# Patient Record
Sex: Female | Born: 1950 | Race: Black or African American | Hispanic: No | Marital: Married | Smoking: Former smoker
Health system: Southern US, Community
[De-identification: ages and names within clinical notes are randomized; demographics above are authoritative.]

## PROBLEM LIST (undated history)

## (undated) DIAGNOSIS — H269 Unspecified cataract: Secondary | ICD-10-CM

## (undated) DIAGNOSIS — J189 Pneumonia, unspecified organism: Secondary | ICD-10-CM

## (undated) DIAGNOSIS — F329 Major depressive disorder, single episode, unspecified: Secondary | ICD-10-CM

## (undated) DIAGNOSIS — Z973 Presence of spectacles and contact lenses: Secondary | ICD-10-CM

## (undated) DIAGNOSIS — I639 Cerebral infarction, unspecified: Principal | ICD-10-CM

## (undated) DIAGNOSIS — E7800 Pure hypercholesterolemia, unspecified: Secondary | ICD-10-CM

## (undated) DIAGNOSIS — G629 Polyneuropathy, unspecified: Secondary | ICD-10-CM

## (undated) DIAGNOSIS — R590 Localized enlarged lymph nodes: Secondary | ICD-10-CM

## (undated) DIAGNOSIS — Z9289 Personal history of other medical treatment: Secondary | ICD-10-CM

## (undated) DIAGNOSIS — H409 Unspecified glaucoma: Secondary | ICD-10-CM

## (undated) DIAGNOSIS — D649 Anemia, unspecified: Secondary | ICD-10-CM

## (undated) DIAGNOSIS — E119 Type 2 diabetes mellitus without complications: Secondary | ICD-10-CM

## (undated) DIAGNOSIS — F419 Anxiety disorder, unspecified: Secondary | ICD-10-CM

## (undated) DIAGNOSIS — F319 Bipolar disorder, unspecified: Secondary | ICD-10-CM

## (undated) DIAGNOSIS — I10 Essential (primary) hypertension: Secondary | ICD-10-CM

## (undated) DIAGNOSIS — C801 Malignant (primary) neoplasm, unspecified: Secondary | ICD-10-CM

## (undated) DIAGNOSIS — F32A Depression, unspecified: Secondary | ICD-10-CM

## (undated) HISTORY — PX: TUBAL LIGATION: SHX77

## (undated) HISTORY — DX: Unspecified glaucoma: H40.9

## (undated) HISTORY — PX: COLONOSCOPY W/ BIOPSIES AND POLYPECTOMY: SHX1376

## (undated) HISTORY — PX: CATARACT EXTRACTION W/ INTRAOCULAR LENS  IMPLANT, BILATERAL: SHX1307

## (undated) HISTORY — DX: Unspecified cataract: H26.9

## (undated) HISTORY — DX: Cerebral infarction, unspecified: I63.9

## (undated) HISTORY — DX: Bipolar disorder, unspecified: F31.9

---

## 2008-09-22 ENCOUNTER — Emergency Department (HOSPITAL_COMMUNITY): Admission: EM | Admit: 2008-09-22 | Discharge: 2008-09-22 | Payer: Self-pay | Admitting: Emergency Medicine

## 2009-03-12 ENCOUNTER — Emergency Department (HOSPITAL_COMMUNITY): Admission: EM | Admit: 2009-03-12 | Discharge: 2009-03-12 | Payer: Self-pay | Admitting: Family Medicine

## 2009-07-29 ENCOUNTER — Emergency Department (HOSPITAL_COMMUNITY): Admission: EM | Admit: 2009-07-29 | Discharge: 2009-07-29 | Payer: Self-pay | Admitting: Emergency Medicine

## 2009-07-31 ENCOUNTER — Emergency Department (HOSPITAL_COMMUNITY): Admission: EM | Admit: 2009-07-31 | Discharge: 2009-07-31 | Payer: Self-pay | Admitting: Family Medicine

## 2010-10-08 ENCOUNTER — Emergency Department (HOSPITAL_COMMUNITY)
Admission: EM | Admit: 2010-10-08 | Discharge: 2010-10-08 | Payer: Self-pay | Source: Home / Self Care | Admitting: Family Medicine

## 2011-02-13 LAB — CULTURE, ROUTINE-ABSCESS

## 2012-03-30 ENCOUNTER — Ambulatory Visit: Payer: Self-pay | Admitting: *Deleted

## 2012-09-14 ENCOUNTER — Encounter (HOSPITAL_COMMUNITY): Payer: Self-pay | Admitting: *Deleted

## 2012-09-14 ENCOUNTER — Emergency Department (HOSPITAL_COMMUNITY)
Admission: EM | Admit: 2012-09-14 | Discharge: 2012-09-14 | Disposition: A | Discharge status: Home / Self Care | Payer: BC Managed Care – PPO | Attending: Emergency Medicine | Admitting: Emergency Medicine

## 2012-09-14 DIAGNOSIS — Z79899 Other long term (current) drug therapy: Secondary | ICD-10-CM | POA: Insufficient documentation

## 2012-09-14 DIAGNOSIS — F3289 Other specified depressive episodes: Secondary | ICD-10-CM | POA: Insufficient documentation

## 2012-09-14 DIAGNOSIS — G5690 Unspecified mononeuropathy of unspecified upper limb: Secondary | ICD-10-CM | POA: Insufficient documentation

## 2012-09-14 DIAGNOSIS — E119 Type 2 diabetes mellitus without complications: Secondary | ICD-10-CM | POA: Insufficient documentation

## 2012-09-14 DIAGNOSIS — F329 Major depressive disorder, single episode, unspecified: Secondary | ICD-10-CM | POA: Insufficient documentation

## 2012-09-14 DIAGNOSIS — G629 Polyneuropathy, unspecified: Secondary | ICD-10-CM

## 2012-09-14 DIAGNOSIS — E7800 Pure hypercholesterolemia, unspecified: Secondary | ICD-10-CM | POA: Insufficient documentation

## 2012-09-14 DIAGNOSIS — F17200 Nicotine dependence, unspecified, uncomplicated: Secondary | ICD-10-CM | POA: Insufficient documentation

## 2012-09-14 DIAGNOSIS — I10 Essential (primary) hypertension: Secondary | ICD-10-CM | POA: Insufficient documentation

## 2012-09-14 HISTORY — DX: Major depressive disorder, single episode, unspecified: F32.9

## 2012-09-14 HISTORY — DX: Pure hypercholesterolemia, unspecified: E78.00

## 2012-09-14 HISTORY — DX: Depression, unspecified: F32.A

## 2012-09-14 HISTORY — DX: Essential (primary) hypertension: I10

## 2012-09-14 HISTORY — DX: Polyneuropathy, unspecified: G62.9

## 2012-09-14 HISTORY — DX: Type 2 diabetes mellitus without complications: E11.9

## 2012-09-14 NOTE — ED Provider Notes (Signed)
History     CSN: [id number]  Arrival date & time 09/14/12  1238   First MD Initiated Contact with Patient 09/14/12 1303      Chief Complaint  Patient presents with  . Numbness    (Consider location/radiation/quality/duration/timing/severity/associated sxs/prior treatment) HPI Comments: Patient presents today with a chief complaint of numbness of the fingers of her left hand.  History obtained by the patient.  She reports that she has had intermittent numbness/tingling of all the fingers of her left hand since yesterday afternoon.  Numbness has been intermittent and typically last 2-3 minutes at a time. She reports that the numbness is only in the fingers and does radiate up or down her arm.  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  She reports that she has been taking her DM medications as prescribed.  She denies any weakness.  Denies facial asymmetry.  Denies difficulty speaking or swallowing.  Denies headache or vision changes.  Denies neck pain.   The history is provided by the patient.    Past Medical History  Diagnosis Date  . Diabetes mellitus without complication   . Hypertension   . Hypercholesteremia   . Peripheral neuropathy   . Depression     History reviewed. No pertinent past surgical history.  History reviewed. No pertinent family history.  History  Substance Use Topics  . Smoking status: Current Every Day Smoker    Types: Cigarettes  . Smokeless tobacco: Not on file  . Alcohol Use: No    OB History    Grav Para Term Preterm Abortions TAB SAB Ect Mult Living                  Review of Systems  Constitutional: Negative for fever and chills.  HENT: Negative for neck pain.   Eyes: Negative for visual disturbance.  Gastrointestinal: Negative for nausea and vomiting.  Musculoskeletal: Negative for gait problem.  Skin: Negative for color change.  Neurological: Positive for numbness. Negative for dizziness, syncope,  facial asymmetry, speech difficulty, weakness, light-headedness and headaches.    Allergies  Review of patient's allergies indicates no known allergies.  Home Medications   Current Outpatient Rx  Name  Route  Sig  Dispense  Refill  . CHOLECALCIFEROL 400 UNITS PO TABS   Oral   Take 400 Units by mouth daily.         . OMEGA-3 FATTY ACIDS 1000 MG PO CAPS   Oral   Take 1 g by mouth daily.         Marland Kitchen GABAPENTIN 300 MG PO CAPS   Oral   Take 600 mg by mouth at bedtime.         Marland Kitchen GLIPIZIDE 5 MG PO TABS   Oral   Take 5 mg by mouth daily.         Marland Kitchen HYDROCHLOROTHIAZIDE 25 MG PO TABS   Oral   Take 25 mg by mouth daily.         Marland Kitchen LATANOPROST 0.005 % OP SOLN   Both Eyes   Place 1 drop into both eyes at bedtime.         Marland Kitchen LISINOPRIL 2.5 MG PO TABS   Oral   Take 2.5 mg by mouth daily.         Marland Kitchen METFORMIN HCL ER (MOD) 1000 MG PO TB24   Oral   Take 1,000 mg by mouth 2 (two) times daily with a meal.         .  ROSUVASTATIN CALCIUM 10 MG PO TABS   Oral   Take 10 mg by mouth daily.         . SERTRALINE HCL 50 MG PO TABS   Oral   Take 50 mg by mouth daily.         Marland Kitchen VITAMIN C 500 MG PO TABS   Oral   Take 500 mg by mouth daily.         Marland Kitchen VITAMIN E 100 UNITS PO CAPS   Oral   Take 100 Units by mouth daily.           BP 161/61  Pulse 80  Temp 97.4 F (36.3 C) (Oral)  Resp 18  SpO2 100%  Physical Exam  Nursing note and vitals reviewed. Constitutional: She appears well-developed and well-nourished. No distress.  HENT:  Head: Normocephalic and atraumatic.  Mouth/Throat: Oropharynx is clear and moist.  Eyes: EOM are normal. Pupils are equal, round, and reactive to light.  Neck: Normal range of motion. Neck supple.  Cardiovascular: Normal rate, regular rhythm and normal heart sounds.   Pulmonary/Chest: Effort normal and breath sounds normal.  Musculoskeletal: Normal range of motion.       Negative Tinel's sign Negative Phalen's sign    Neurological: She is alert. She has normal strength. No cranial nerve deficit or sensory deficit. Gait normal.  Reflex Scores:      Brachioradialis reflexes are 2+ on the right side and 2+ on the left side. Skin: Skin is warm and dry. She is not diaphoretic. No erythema.  Psychiatric: She has a normal mood and affect.    ED Course  Procedures (including critical care time)  Labs Reviewed - No data to display No results found.   No diagnosis found.  Patient discussed with Dr. Estell Harpin.  MDM  Patient presenting with subjective numbness of all her fingers of her left hand intermittently since yesterday.  Negative Phalen's and Tinel's.  Patient is diabetic with diabetic neuropathy of both of her feet.  Suspect that numbness of hands may be related to diabetic neuropathy.  Patient has a normal neurological exam today.  Therefore, feel that she can follow up with this outpatient with her PCP.        Pascal Lux [place], PA-C 09/15/12 1008

## 2012-09-14 NOTE — ED Notes (Signed)
Pt c/o numbness to left hand that started yesterday. States its "on and off" reports hx of diabetes and reports hand feels similar to neuropathy in feet. Denies other symptoms.

## 2012-09-20 NOTE — ED Provider Notes (Signed)
Medical screening examination/treatment/procedure(s) were performed by non-physician practitioner and as supervising physician I was immediately available for consultation/collaboration.   Benny Lennert, MD 09/20/12 [phone]

## 2013-05-20 ENCOUNTER — Encounter (HOSPITAL_COMMUNITY): Payer: Self-pay | Admitting: Emergency Medicine

## 2013-05-20 ENCOUNTER — Inpatient Hospital Stay (HOSPITAL_COMMUNITY)
Admission: EM | Admit: 2013-05-20 | Discharge: 2013-05-26 | DRG: 294 | Disposition: A | Discharge status: Home Health | Payer: BC Managed Care – PPO | Attending: Family Medicine | Admitting: Family Medicine

## 2013-05-20 ENCOUNTER — Emergency Department (HOSPITAL_COMMUNITY): Payer: BC Managed Care – PPO

## 2013-05-20 DIAGNOSIS — E1169 Type 2 diabetes mellitus with other specified complication: Principal | ICD-10-CM

## 2013-05-20 DIAGNOSIS — L03119 Cellulitis of unspecified part of limb: Secondary | ICD-10-CM | POA: Diagnosis present

## 2013-05-20 DIAGNOSIS — Z79899 Other long term (current) drug therapy: Secondary | ICD-10-CM

## 2013-05-20 DIAGNOSIS — L02619 Cutaneous abscess of unspecified foot: Secondary | ICD-10-CM | POA: Diagnosis present

## 2013-05-20 DIAGNOSIS — E876 Hypokalemia: Secondary | ICD-10-CM | POA: Diagnosis present

## 2013-05-20 DIAGNOSIS — L089 Local infection of the skin and subcutaneous tissue, unspecified: Secondary | ICD-10-CM

## 2013-05-20 DIAGNOSIS — D649 Anemia, unspecified: Secondary | ICD-10-CM

## 2013-05-20 DIAGNOSIS — Z794 Long term (current) use of insulin: Secondary | ICD-10-CM

## 2013-05-20 DIAGNOSIS — D638 Anemia in other chronic diseases classified elsewhere: Secondary | ICD-10-CM | POA: Diagnosis present

## 2013-05-20 DIAGNOSIS — N179 Acute kidney failure, unspecified: Secondary | ICD-10-CM | POA: Diagnosis present

## 2013-05-20 DIAGNOSIS — N289 Disorder of kidney and ureter, unspecified: Secondary | ICD-10-CM

## 2013-05-20 DIAGNOSIS — E871 Hypo-osmolality and hyponatremia: Secondary | ICD-10-CM | POA: Diagnosis present

## 2013-05-20 DIAGNOSIS — I129 Hypertensive chronic kidney disease with stage 1 through stage 4 chronic kidney disease, or unspecified chronic kidney disease: Secondary | ICD-10-CM | POA: Diagnosis present

## 2013-05-20 DIAGNOSIS — E1142 Type 2 diabetes mellitus with diabetic polyneuropathy: Secondary | ICD-10-CM | POA: Diagnosis present

## 2013-05-20 DIAGNOSIS — I10 Essential (primary) hypertension: Secondary | ICD-10-CM

## 2013-05-20 DIAGNOSIS — F17200 Nicotine dependence, unspecified, uncomplicated: Secondary | ICD-10-CM | POA: Diagnosis present

## 2013-05-20 DIAGNOSIS — E11628 Type 2 diabetes mellitus with other skin complications: Secondary | ICD-10-CM | POA: Diagnosis present

## 2013-05-20 DIAGNOSIS — E7800 Pure hypercholesterolemia, unspecified: Secondary | ICD-10-CM | POA: Diagnosis present

## 2013-05-20 DIAGNOSIS — F3289 Other specified depressive episodes: Secondary | ICD-10-CM | POA: Diagnosis present

## 2013-05-20 DIAGNOSIS — L97509 Non-pressure chronic ulcer of other part of unspecified foot with unspecified severity: Secondary | ICD-10-CM | POA: Diagnosis present

## 2013-05-20 DIAGNOSIS — E1149 Type 2 diabetes mellitus with other diabetic neurological complication: Secondary | ICD-10-CM | POA: Diagnosis present

## 2013-05-20 DIAGNOSIS — F329 Major depressive disorder, single episode, unspecified: Secondary | ICD-10-CM

## 2013-05-20 DIAGNOSIS — N182 Chronic kidney disease, stage 2 (mild): Secondary | ICD-10-CM | POA: Diagnosis present

## 2013-05-20 LAB — CBC WITH DIFFERENTIAL/PLATELET
Basophils Relative: 0 % (ref 0–1)
HCT: 29.7 % — ABNORMAL LOW (ref 36.0–46.0)
Hemoglobin: 10.1 g/dL — ABNORMAL LOW (ref 12.0–15.0)
Lymphocytes Relative: 8 % — ABNORMAL LOW (ref 12–46)
Lymphs Abs: 1.8 10*3/uL (ref 0.7–4.0)
MCHC: 34 g/dL (ref 30.0–36.0)
Monocytes Absolute: 1.9 10*3/uL — ABNORMAL HIGH (ref 0.1–1.0)
Monocytes Relative: 9 % (ref 3–12)
Neutro Abs: 18.4 10*3/uL — ABNORMAL HIGH (ref 1.7–7.7)
Neutrophils Relative %: 83 % — ABNORMAL HIGH (ref 43–77)
RBC: 3.52 MIL/uL — ABNORMAL LOW (ref 3.87–5.11)

## 2013-05-20 LAB — POCT I-STAT, CHEM 8
BUN: 24 mg/dL — ABNORMAL HIGH (ref 6–23)
Calcium, Ion: 1.07 mmol/L — ABNORMAL LOW (ref 1.13–1.30)
Chloride: 93 mEq/L — ABNORMAL LOW (ref 96–112)
Creatinine, Ser: 2 mg/dL — ABNORMAL HIGH (ref 0.50–1.10)
Glucose, Bld: 192 mg/dL — ABNORMAL HIGH (ref 70–99)
HCT: 32 % — ABNORMAL LOW (ref 36.0–46.0)
Potassium: 3.2 mEq/L — ABNORMAL LOW (ref 3.5–5.1)

## 2013-05-20 MED ORDER — PIPERACILLIN-TAZOBACTAM 3.375 G IVPB
3.3750 g | Freq: Once | INTRAVENOUS | Status: AC
  Administered 2013-05-20: 3.375 g via INTRAVENOUS
  Filled 2013-05-20: qty 50

## 2013-05-20 MED ORDER — VANCOMYCIN HCL IN DEXTROSE 1-5 GM/200ML-% IV SOLN
1000.0000 mg | Freq: Once | INTRAVENOUS | Status: AC
  Administered 2013-05-20: 1000 mg via INTRAVENOUS
  Filled 2013-05-20: qty 200

## 2013-05-20 MED ORDER — INSULIN GLARGINE 100 UNIT/ML ~~LOC~~ SOLN
60.0000 [IU] | Freq: Once | SUBCUTANEOUS | Status: AC
  Administered 2013-05-20: 60 [IU] via SUBCUTANEOUS
  Filled 2013-05-20: qty 0.6

## 2013-05-20 MED ORDER — SODIUM CHLORIDE 0.9 % IV BOLUS (SEPSIS)
1000.0000 mL | Freq: Once | INTRAVENOUS | Status: AC
  Administered 2013-05-20: 1000 mL via INTRAVENOUS

## 2013-05-20 NOTE — ED Notes (Signed)
Patient had a recheck of her right big toe infection and wound on the 7th. Since she was changing the dressing at home. Today the daughter noticed that it has an odor and looks to be infected.

## 2013-05-20 NOTE — ED Provider Notes (Signed)
History    CSN: [id number] Arrival date & time 05/20/13  4098  First MD Initiated Contact with Patient 05/20/13 2008     Chief Complaint  Patient presents with  . Wound Check   HPI  History provided by the patient. Patient is a 62 year old female with history of hypertension, hypercholesterolemia and diabetes presenting with concerns of wound infection 2 right foot. Patient reports having an retropulse her near right great toe and pad of the foot for the past several months. She was being seen at the wound center with last appointment over a week ago. She does report having x-rays at that time. Patient also believes she is taking an antibiotic but does not recall the name. Generally her wound has been doing well but over the last 2 days she's had increased soreness with swelling and redness to the foot. She also reports foul odor and slight drainage on the bandages. Patient reports associated subjective chills but denies any fevers. She otherwise feels well without any other changes. Denies any aggravating or alleviating factors. No other associated symptoms.    Past Medical History  Diagnosis Date  . Diabetes mellitus without complication   . Hypertension   . Hypercholesteremia   . Peripheral neuropathy   . Depression    History reviewed. No pertinent past surgical history. History reviewed. No pertinent family history. History  Substance Use Topics  . Smoking status: Current Every Day Smoker    Types: Cigarettes  . Smokeless tobacco: Not on file  . Alcohol Use: No   OB History   Grav Para Term Preterm Abortions TAB SAB Ect Mult Living                 Review of Systems  Constitutional: Positive for chills. Negative for fever and fatigue.  Neurological: Negative for light-headedness and headaches.  All other systems reviewed and are negative.    Allergies  Review of patient's allergies indicates no known allergies.  Home Medications   Current Outpatient Rx  Name   Route  Sig  Dispense  Refill  . aspirin EC 81 MG tablet   Oral   Take 81 mg by mouth daily.         . Calcium Carb-Cholecalciferol (CALCIUM + D3) 600-200 MG-UNIT TABS   Oral   Take 1 tablet by mouth daily.         . cholecalciferol (VITAMIN D) 400 UNITS TABS   Oral   Take 400 Units by mouth daily.         . COD LIVER OIL PO   Oral   Take 1 capsule by mouth daily.         . collagenase (SANTYL) ointment   Topical   Apply 1 application topically daily.         . ferrous gluconate (FERGON) 325 MG tablet   Oral   Take 325 mg by mouth daily with breakfast.         . fish oil-omega-3 fatty acids 1000 MG capsule   Oral   Take 1 g by mouth daily.         Marland Kitchen gabapentin (NEURONTIN) 300 MG capsule   Oral   Take 600 mg by mouth at bedtime.         Marland Kitchen glimepiride (AMARYL) 4 MG tablet   Oral   Take 4 mg by mouth daily before breakfast.         . hydrochlorothiazide (HYDRODIURIL) 25 MG tablet   Oral  Take 25 mg by mouth daily.         . insulin glargine (LANTUS) 100 UNIT/ML injection   Subcutaneous   Inject 60 Units into the skin at bedtime.         . lamoTRIgine (LAMICTAL) 25 MG tablet   Oral   Take 25 mg by mouth daily.         Marland Kitchen latanoprost (XALATAN) 0.005 % ophthalmic solution   Both Eyes   Place 1 drop into both eyes at bedtime.         Marland Kitchen lisinopril (PRINIVIL,ZESTRIL) 5 MG tablet   Oral   Take 5 mg by mouth daily.         . Lurasidone HCl (LATUDA) 60 MG TABS   Oral   Take 60 mg by mouth daily.         . rosuvastatin (CRESTOR) 10 MG tablet   Oral   Take 10 mg by mouth daily.         . saxagliptin HCl (ONGLYZA) 5 MG TABS tablet   Oral   Take 5 mg by mouth daily.         . sertraline (ZOLOFT) 50 MG tablet   Oral   Take 50 mg by mouth daily.         Marland Kitchen thiamine 100 MG tablet   Oral   Take 100 mg by mouth daily.         . vitamin C (ASCORBIC ACID) 500 MG tablet   Oral   Take 500 mg by mouth daily.         .  vitamin E 100 UNIT capsule   Oral   Take 100 Units by mouth daily.          BP 154/76  Pulse 108  Temp(Src) 98.9 F (37.2 C) (Oral)  Resp 20  SpO2 97% Physical Exam  Nursing note and vitals reviewed. Constitutional: She is oriented to person, place, and time. She appears well-developed and well-nourished. No distress.  HENT:  Head: Normocephalic.  Cardiovascular: Regular rhythm.  Tachycardia present.   Pulmonary/Chest: Effort normal and breath sounds normal. No respiratory distress.  Abdominal: Soft.  Musculoskeletal: She exhibits edema and tenderness.  Neurological: She is alert and oriented to person, place, and time.  Patient with decreased sensation to bilateral feet reported at baseline. Normal movements and toes and feet.  Skin: Skin is warm and dry. No rash noted.  3 cm circular of the wound to the pad of the right foot near first MTP joint. Small amount of sero sanguinous drainage. Diffuse erythema and swelling of the medial foot with increased warmth. No erythematous streaks.  Psychiatric: She has a normal mood and affect. Her behavior is normal.    ED Course  Procedures   Results for orders placed during the hospital encounter of 05/20/13  CBC WITH DIFFERENTIAL      Result Value Range   WBC 22.2 (*) 4.0 - 10.5 K/uL   RBC 3.52 (*) 3.87 - 5.11 MIL/uL   Hemoglobin 10.1 (*) 12.0 - 15.0 g/dL   HCT 16.1 (*) 09.6 - 04.5 %   MCV 84.4  78.0 - 100.0 fL   MCH 28.7  26.0 - 34.0 pg   MCHC 34.0  30.0 - 36.0 g/dL   RDW 40.9  81.1 - 91.4 %   Platelets 596 (*) 150 - 400 K/uL   Neutrophils Relative % 83 (*) 43 - 77 %   Neutro Abs 18.4 (*) 1.7 - 7.7  K/uL   Lymphocytes Relative 8 (*) 12 - 46 %   Lymphs Abs 1.8  0.7 - 4.0 K/uL   Monocytes Relative 9  3 - 12 %   Monocytes Absolute 1.9 (*) 0.1 - 1.0 K/uL   Eosinophils Relative 0  0 - 5 %   Eosinophils Absolute 0.0  0.0 - 0.7 K/uL   Basophils Relative 0  0 - 1 %   Basophils Absolute 0.0  0.0 - 0.1 K/uL  GLUCOSE, CAPILLARY       Result Value Range   Glucose-Capillary 174 (*) 70 - 99 mg/dL  POCT I-STAT, CHEM 8      Result Value Range   Sodium 129 (*) 135 - 145 mEq/L   Potassium 3.2 (*) 3.5 - 5.1 mEq/L   Chloride 93 (*) 96 - 112 mEq/L   BUN 24 (*) 6 - 23 mg/dL   Creatinine, Ser 8.29 (*) 0.50 - 1.10 mg/dL   Glucose, Bld 562 (*) 70 - 99 mg/dL   Calcium, Ion 1.30 (*) 1.13 - 1.30 mmol/L   TCO2 25  0 - 100 mmol/L   Hemoglobin 10.9 (*) 12.0 - 15.0 g/dL   HCT 86.5 (*) 78.4 - 69.6 %       1. Diabetic foot infection      MDM  8:10 PM patient seen and evaluated. Patient appears comfortable in no acute distress. Does not appear severely ill or toxic.  Patient was definitely elevated WBC. Patient was also seen and evaluated with attending physician. We'll plan on admission.  Spoke with [facility] hospitalist. They will see patient and admit.    Angus Seller, PA-C 05/20/13 2253

## 2013-05-21 ENCOUNTER — Encounter (HOSPITAL_COMMUNITY): Payer: Self-pay | Admitting: *Deleted

## 2013-05-21 DIAGNOSIS — L089 Local infection of the skin and subcutaneous tissue, unspecified: Secondary | ICD-10-CM | POA: Diagnosis present

## 2013-05-21 DIAGNOSIS — N289 Disorder of kidney and ureter, unspecified: Secondary | ICD-10-CM | POA: Diagnosis present

## 2013-05-21 DIAGNOSIS — E871 Hypo-osmolality and hyponatremia: Secondary | ICD-10-CM | POA: Diagnosis present

## 2013-05-21 DIAGNOSIS — E876 Hypokalemia: Secondary | ICD-10-CM | POA: Diagnosis present

## 2013-05-21 DIAGNOSIS — F329 Major depressive disorder, single episode, unspecified: Secondary | ICD-10-CM | POA: Diagnosis present

## 2013-05-21 DIAGNOSIS — E1142 Type 2 diabetes mellitus with diabetic polyneuropathy: Secondary | ICD-10-CM | POA: Diagnosis present

## 2013-05-21 DIAGNOSIS — D649 Anemia, unspecified: Secondary | ICD-10-CM | POA: Diagnosis present

## 2013-05-21 DIAGNOSIS — I10 Essential (primary) hypertension: Secondary | ICD-10-CM | POA: Diagnosis present

## 2013-05-21 LAB — CBC WITH DIFFERENTIAL/PLATELET
Basophils Absolute: 0 10*3/uL (ref 0.0–0.1)
Eosinophils Absolute: 0 10*3/uL (ref 0.0–0.7)
Eosinophils Relative: 0 % (ref 0–5)
Lymphocytes Relative: 11 % — ABNORMAL LOW (ref 12–46)
Lymphs Abs: 1.7 10*3/uL (ref 0.7–4.0)
Neutrophils Relative %: 81 % — ABNORMAL HIGH (ref 43–77)
Platelets: 536 10*3/uL — ABNORMAL HIGH (ref 150–400)
RBC: 3.18 MIL/uL — ABNORMAL LOW (ref 3.87–5.11)
RDW: 13.1 % (ref 11.5–15.5)
WBC: 15.6 10*3/uL — ABNORMAL HIGH (ref 4.0–10.5)

## 2013-05-21 LAB — GLUCOSE, CAPILLARY: Glucose-Capillary: 165 mg/dL — ABNORMAL HIGH (ref 70–99)

## 2013-05-21 LAB — BASIC METABOLIC PANEL
BUN: 27 mg/dL — ABNORMAL HIGH (ref 6–23)
BUN: 23 mg/dL (ref 6–23)
CO2: 27 mEq/L (ref 19–32)
CO2: 24 mEq/L (ref 19–32)
Calcium: 9.1 mg/dL (ref 8.4–10.5)
Chloride: 91 mEq/L — ABNORMAL LOW (ref 96–112)
Creatinine, Ser: 1.69 mg/dL — ABNORMAL HIGH (ref 0.50–1.10)
Creatinine, Ser: 1.44 mg/dL — ABNORMAL HIGH (ref 0.50–1.10)
GFR calc Af Amer: 37 mL/min — ABNORMAL LOW (ref >90)
GFR calc Af Amer: 45 mL/min — ABNORMAL LOW (ref >90)
GFR calc non Af Amer: 39 mL/min — ABNORMAL LOW (ref >90)
Glucose, Bld: 249 mg/dL — ABNORMAL HIGH (ref 70–99)
Glucose, Bld: 183 mg/dL — ABNORMAL HIGH (ref 70–99)
Potassium: 2.5 mEq/L — CL (ref 3.5–5.1)
Potassium: 3.5 mEq/L (ref 3.5–5.1)
Sodium: 128 mEq/L — ABNORMAL LOW (ref 135–145)
Sodium: 125 mEq/L — ABNORMAL LOW (ref 135–145)

## 2013-05-21 LAB — C-REACTIVE PROTEIN: CRP: 30.9 mg/dL — ABNORMAL HIGH (ref <0.60)

## 2013-05-21 LAB — MAGNESIUM: Magnesium: 2 mg/dL (ref 1.5–2.5)

## 2013-05-21 MED ORDER — INSULIN ASPART 100 UNIT/ML ~~LOC~~ SOLN
0.0000 [IU] | Freq: Three times a day (TID) | SUBCUTANEOUS | Status: DC
  Administered 2013-05-21: 12:00:00 via SUBCUTANEOUS
  Administered 2013-05-22: 2 [IU] via SUBCUTANEOUS
  Administered 2013-05-25 – 2013-05-26 (×2): 5 [IU] via SUBCUTANEOUS
  Administered 2013-05-26: 3 [IU] via SUBCUTANEOUS

## 2013-05-21 MED ORDER — OXYCODONE HCL 5 MG PO TABS
5.0000 mg | ORAL_TABLET | ORAL | Status: DC | PRN
  Administered 2013-05-21 – 2013-05-22 (×3): 5 mg via ORAL
  Filled 2013-05-21 (×3): qty 1

## 2013-05-21 MED ORDER — INSULIN GLARGINE 100 UNIT/ML ~~LOC~~ SOLN
60.0000 [IU] | Freq: Every day | SUBCUTANEOUS | Status: DC
  Administered 2013-05-21 – 2013-05-23 (×2): 60 [IU] via SUBCUTANEOUS
  Filled 2013-05-21 (×6): qty 0.6

## 2013-05-21 MED ORDER — VITAMIN C 500 MG PO TABS
500.0000 mg | ORAL_TABLET | Freq: Every day | ORAL | Status: DC
  Administered 2013-05-21 – 2013-05-26 (×6): 500 mg via ORAL
  Filled 2013-05-21 (×7): qty 1

## 2013-05-21 MED ORDER — POTASSIUM CHLORIDE IN NACL 40-0.9 MEQ/L-% IV SOLN
INTRAVENOUS | Status: DC
  Administered 2013-05-21 (×2): via INTRAVENOUS
  Filled 2013-05-21 (×4): qty 1000

## 2013-05-21 MED ORDER — ENOXAPARIN SODIUM 40 MG/0.4ML ~~LOC~~ SOLN
40.0000 mg | SUBCUTANEOUS | Status: DC
  Administered 2013-05-21: 40 mg via SUBCUTANEOUS
  Filled 2013-05-21 (×3): qty 0.4

## 2013-05-21 MED ORDER — VITAMIN B-1 100 MG PO TABS
100.0000 mg | ORAL_TABLET | Freq: Every day | ORAL | Status: DC
  Administered 2013-05-21 – 2013-05-26 (×6): 100 mg via ORAL
  Filled 2013-05-21 (×6): qty 1

## 2013-05-21 MED ORDER — OXYCODONE HCL 5 MG PO TABS
5.0000 mg | ORAL_TABLET | ORAL | Status: DC | PRN
  Administered 2013-05-21 (×2): 5 mg via ORAL
  Filled 2013-05-21 (×2): qty 1

## 2013-05-21 MED ORDER — CHOLECALCIFEROL 10 MCG (400 UNIT) PO TABS
400.0000 [IU] | ORAL_TABLET | Freq: Every day | ORAL | Status: DC
  Administered 2013-05-21 – 2013-05-26 (×6): 400 [IU] via ORAL
  Filled 2013-05-21 (×6): qty 1

## 2013-05-21 MED ORDER — LURASIDONE HCL 40 MG PO TABS
60.0000 mg | ORAL_TABLET | Freq: Every day | ORAL | Status: DC
  Administered 2013-05-21 – 2013-05-26 (×5): 60 mg via ORAL
  Filled 2013-05-21 (×7): qty 2

## 2013-05-21 MED ORDER — PNEUMOCOCCAL VAC POLYVALENT 25 MCG/0.5ML IJ INJ
0.5000 mL | INJECTION | Freq: Once | INTRAMUSCULAR | Status: DC
  Filled 2013-05-21 (×2): qty 0.5

## 2013-05-21 MED ORDER — LAMOTRIGINE 25 MG PO TABS
25.0000 mg | ORAL_TABLET | Freq: Every day | ORAL | Status: DC
  Administered 2013-05-21 – 2013-05-26 (×6): 25 mg via ORAL
  Filled 2013-05-21 (×6): qty 1

## 2013-05-21 MED ORDER — PNEUMOCOCCAL VAC POLYVALENT 25 MCG/0.5ML IJ INJ
0.5000 mL | INJECTION | INTRAMUSCULAR | Status: AC
  Administered 2013-05-22: 0.5 mL via INTRAMUSCULAR
  Filled 2013-05-21 (×2): qty 0.5

## 2013-05-21 MED ORDER — LINAGLIPTIN 5 MG PO TABS
5.0000 mg | ORAL_TABLET | Freq: Every day | ORAL | Status: DC
  Administered 2013-05-21 – 2013-05-24 (×3): 5 mg via ORAL
  Filled 2013-05-21 (×6): qty 1

## 2013-05-21 MED ORDER — VANCOMYCIN HCL IN DEXTROSE 1-5 GM/200ML-% IV SOLN
1000.0000 mg | INTRAVENOUS | Status: AC
  Administered 2013-05-22 – 2013-05-24 (×4): 1000 mg via INTRAVENOUS
  Filled 2013-05-21 (×4): qty 200

## 2013-05-21 MED ORDER — LATANOPROST 0.005 % OP SOLN
1.0000 [drp] | Freq: Every day | OPHTHALMIC | Status: DC
  Administered 2013-05-21 – 2013-05-25 (×6): 1 [drp] via OPHTHALMIC
  Filled 2013-05-21: qty 2.5

## 2013-05-21 MED ORDER — POTASSIUM CHLORIDE CRYS ER 20 MEQ PO TBCR
40.0000 meq | EXTENDED_RELEASE_TABLET | Freq: Once | ORAL | Status: DC
  Administered 2013-05-21: 40 meq via ORAL
  Filled 2013-05-21: qty 2

## 2013-05-21 MED ORDER — POTASSIUM CHLORIDE CRYS ER 20 MEQ PO TBCR
40.0000 meq | EXTENDED_RELEASE_TABLET | Freq: Three times a day (TID) | ORAL | Status: DC
  Administered 2013-05-21 – 2013-05-22 (×4): 40 meq via ORAL
  Filled 2013-05-21 (×5): qty 2

## 2013-05-21 MED ORDER — SERTRALINE HCL 50 MG PO TABS
50.0000 mg | ORAL_TABLET | Freq: Every day | ORAL | Status: DC
  Administered 2013-05-21 – 2013-05-26 (×6): 50 mg via ORAL
  Filled 2013-05-21 (×6): qty 1

## 2013-05-21 MED ORDER — GABAPENTIN 300 MG PO CAPS
600.0000 mg | ORAL_CAPSULE | Freq: Every day | ORAL | Status: DC
  Administered 2013-05-21 – 2013-05-25 (×6): 600 mg via ORAL
  Filled 2013-05-21 (×7): qty 2

## 2013-05-21 MED ORDER — ASPIRIN EC 81 MG PO TBEC
81.0000 mg | DELAYED_RELEASE_TABLET | Freq: Every day | ORAL | Status: DC
  Administered 2013-05-21 – 2013-05-23 (×3): 81 mg via ORAL
  Filled 2013-05-21 (×3): qty 1

## 2013-05-21 MED ORDER — PIPERACILLIN-TAZOBACTAM 3.375 G IVPB
3.3750 g | Freq: Three times a day (TID) | INTRAVENOUS | Status: DC
  Administered 2013-05-21 – 2013-05-26 (×15): 3.375 g via INTRAVENOUS
  Filled 2013-05-21 (×17): qty 50

## 2013-05-21 MED ORDER — POTASSIUM CHLORIDE 10 MEQ/100ML IV SOLN
10.0000 meq | INTRAVENOUS | Status: AC
  Administered 2013-05-21 (×6): 10 meq via INTRAVENOUS
  Filled 2013-05-21 (×6): qty 100

## 2013-05-21 MED ORDER — FERROUS GLUCONATE 324 (38 FE) MG PO TABS
325.0000 mg | ORAL_TABLET | Freq: Every day | ORAL | Status: DC
  Administered 2013-05-21 – 2013-05-22 (×2): 324 mg via ORAL
  Administered 2013-05-24 – 2013-05-26 (×3): 325 mg via ORAL
  Filled 2013-05-21 (×7): qty 1

## 2013-05-21 MED ORDER — GLIMEPIRIDE 4 MG PO TABS
4.0000 mg | ORAL_TABLET | Freq: Every day | ORAL | Status: DC
  Administered 2013-05-21 – 2013-05-25 (×4): 4 mg via ORAL
  Filled 2013-05-21 (×7): qty 1

## 2013-05-21 MED ORDER — ATORVASTATIN CALCIUM 20 MG PO TABS
20.0000 mg | ORAL_TABLET | Freq: Every day | ORAL | Status: DC
  Administered 2013-05-21 – 2013-05-25 (×4): 20 mg via ORAL
  Filled 2013-05-21 (×6): qty 1

## 2013-05-21 NOTE — Progress Notes (Signed)
CRITICAL VALUE ALERT  Critical value received:  K+ 2.5  Date of notification:  05/21/2013  Time of notification: 0543  Critical value read back: yes  Nurse who received alert:  Doretha Sou, RN  MD notified (1st page):  [place], PA-C  Time of first page:  [phone]  MD notified (2nd page):  Time of second page:  Responding MD:  Buelah Manis  Time MD responded:  [phone]

## 2013-05-21 NOTE — Progress Notes (Signed)
Patient ID: Tara Hopkins, female   DOB: Jan 01, 1951, 62 y.o.   MRN: [id number] [facility] PROGRESS NOTE  Tara Hopkins WUJ:[id number] DOB: [birth year] DOA: 05/20/2013 PCP: Arlan Organ., MD  Brief narrative: Pt is 62 y.o. female who is diabetic and has had a foot ulcer for the past month. She was seen at a wound care center about a week ago and started on local wound care and what sounds like doxycycline. Her wound has not improved with that regimen. She slipped and fell one night prior to admission, stubbing her foot. This was associated with malodorous, yellow discharge from the wound, purulent, no blood noted. Pt also reported subjective fevers, chills, no similar events in the past.   In the emergency room, x-ray shows no osteomyelitis. White blood cell count is 22,000. Creatinine is 2.   Principal Problem:   Diabetic foot infection with ulcer and abcess, right - MRI of the right foot ordered and still pending - will continue broad spectrum ABX, will ask ortho team for further assistance - WBC trending down, pt still with low grade fever, Tmax = 99.8 F - ensure diabetes control - will check A1C Active Problems:   DM type 2 with diabetic peripheral neuropathy - will check A1C and continue home regimen with insulin - add SSI, moderate coverage  - diabetic educator consultation    Benign hypertension - reasonable inpatient control    Depression - stable and at baseline    Hypokalemia - continue to supplement, pt tolerating PO so will give K-dur 40 meq total of 5 doses over the next 24 hours - Mg is within normal limits    Hyponatremia - secondary to pre renal etiology, continue IVF and repeat BMP in AM   Anemia of chronic disease - slight drop in Hg since admission, likely dilutional component - overall Hg stable, no signs of acute bleed - repeat CBC in AM   Acute renal failure - likely pre renal etiology imposed in chronic renal failure stage II  not clear what  the baseline creatinine is but pt aware of chronic kidney damage  - IVF provided and pt improving, creatinine is trending down - continue IVF and repeat BMP in AM  Consultants:  Ortho  Procedures/Studies: Dg Foot Complete Right 05/20/2013   No radiographic evidence of osteomyelitis.     Antibiotics:  Vancomycin 7/12 -->  Zosyn 7/12 -->  Code Status: Full Family Communication: Pt at bedside Disposition Plan: Home when medically stable  HPI/Subjective: No events overnight.   Objective: Filed Vitals:   05/20/13 2347 05/21/13 0025 05/21/13 0300 05/21/13 0612  BP: 114/58 125/65 94/48 99/54   Pulse: 99 102 95 98  Temp: 100 F (37.8 C) 98.2 F (36.8 C) 99.8 F (37.7 C)   TempSrc: Oral Oral Oral   Resp: 17 18 16    Height: 5\' 5"  (1.651 m) 5' 5.5" (1.664 m)    Weight: 70.761 kg (156 lb) 70.761 kg (156 lb)    SpO2: 96% 100% 100%     Intake/Output Summary (Last 24 hours) at 05/21/13 0828 Last data filed at 05/21/13 9562  Gross per 24 hour  Intake 1433.75 ml  Output      0 ml  Net 1433.75 ml    Exam:   General:  Pt is alert, follows commands appropriately, not in acute distress  Cardiovascular: Regular rate and rhythm, S1/S2, no murmurs, no rubs, no gallops  Respiratory: Clear to auscultation bilaterally, no wheezing, no crackles, no rhonchi  Abdomen: Soft,  non tender, non distended, bowel sounds present, no guarding  Extremities: 3 cm circular wound to the pad of the right foot near first MTP joint with sero sanguinous drainage. Diffuse erythema and swelling of the medial foot with increased warmth.  Neuro: Grossly nonfocal  Data Reviewed: Basic Metabolic Panel:  Recent Labs Lab 05/20/13 2123 05/21/13 0433  NA 129* 128*  K 3.2* 2.5*  CL 93* 90*  CO2  --  27  GLUCOSE 192* 249*  BUN 24* 27*  CREATININE 2.00* 1.69*  CALCIUM  --  9.3  MG  --  2.0   CBC:  Recent Labs Lab 05/20/13 2115 05/20/13 2123 05/21/13 0433  WBC 22.2*  --  15.6*  NEUTROABS  18.4*  --  12.6*  HGB 10.1* 10.9* 8.9*  HCT 29.7* 32.0* 26.5*  MCV 84.4  --  83.3  PLT 596*  --  536*   CBG:  Recent Labs Lab 05/20/13 2221 05/21/13 0728  GLUCAP 174* 165*   Scheduled Meds: . aspirin EC  81 mg Oral Daily  . atorvastatin  20 mg Oral q1800  . cholecalciferol  400 Units Oral Daily  . ferrous gluconate  325 mg Oral Q breakfast  . gabapentin  600 mg Oral QHS  . glimepiride  4 mg Oral QAC breakfast  . insulin glargine  60 Units Subcutaneous QHS  . lamoTRIgine  25 mg Oral Daily  . latanoprost  1 drop Both Eyes QHS  . linagliptin  5 mg Oral Daily  . lurasidone  60 mg Oral Daily  . potassium chloride  10 mEq Intravenous Q1 Hr x 6  . potassium chloride  40 mEq Oral Once  . sertraline  50 mg Oral Daily  . thiamine  100 mg Oral Daily  . vitamin C  500 mg Oral Daily   Continuous Infusions: . 0.9 % NaCl with KCl 40 mEq / L 75 mL/hr at 05/21/13 0131     Debbora Presto, MD  [facility] Pager [phone]  If 7PM-7AM, please contact night-coverage [email or web address] Password [email or web address] 05/21/2013, 8:28 AM   LOS: 1 day

## 2013-05-21 NOTE — Progress Notes (Signed)
ANTIBIOTIC CONSULT NOTE - INITIAL  Pharmacy Consult for Vancomycin, Zosyn Indication:  Diabetic foot ulcer  No Known Allergies  Patient Measurements: Height: 5' 5.5" (166.4 cm) Weight: 156 lb (70.761 kg) IBW/kg (Calculated) : 58.15  Vital Signs: Temp: 99.8 F (37.7 C) (07/13 0300) Temp src: Oral (07/13 0300) BP: 99/54 mmHg (07/13 0612) Pulse Rate: 98 (07/13 0612) Intake/Output from previous day: 07/12 0701 - 07/13 0700 In: 1433.8 [P.O.:480; I.V.:353.8; IV Piggyback:600] Out: -  Intake/Output from this shift:    Labs:  Recent Labs  05/20/13 2115 05/20/13 2123 05/21/13 0433  WBC 22.2*  --  15.6*  HGB 10.1* 10.9* 8.9*  PLT 596*  --  536*  CREATININE  --  2.00* 1.69*   Estimated Creatinine Clearance: 35.3 ml/min (by C-G formula based on Cr of 1.69). No results found for this basename: VANCOTROUGH, VANCOPEAK, VANCORANDOM, GENTTROUGH, GENTPEAK, GENTRANDOM, TOBRATROUGH, TOBRAPEAK, TOBRARND, AMIKACINPEAK, AMIKACINTROU, AMIKACIN,  in the last 72 hours   Microbiology: No results found for this or any previous visit (from the past 720 hour(s)).  Medical History: Past Medical History  Diagnosis Date  . Diabetes mellitus without complication   . Hypertension   . Hypercholesteremia   . Peripheral neuropathy   . Depression      Assessment: HPI: 62 yo diabetic female presented 7/12 with concerns of wound infection on R foot associated with pain, swelling, redness, slight drainage, and foul odor s/p fall last night. Patient has been seen at the wound center, completed a course of abx (sounds like doxycycline per MD notes). Vanc, Zosyn order for DM foot infection with ulcer and absess. X-rays in ED negative for osteomyelitis. Patient with ARF (Scr 1.69 > 2).  Zosyn and Vanc x 1 already given in the ED.  Note patient with MRSA in R thigh abscess 07/29/2009.   Tmax: 100, WBCs: 15.6K, improving, Scr improving, 1.69. CG 35, N 40. Wound culture pending. Will need I&D and possibly  more invasive surgery per MD. MRI of foot and ortho consult pending.    Goal of Therapy:  Vancomycin trough level 15-20 mcg/ml Appropriate renal adjustments of antibiotics  Plan:   Vancomycin 1gm IV q24h  Zosyn 3.375 gm IV q8h extended infusion over 4 hours   Pharmacy will f/u  Geoffry Paradise, PharmD, BCPS Pager: [phone] 9:03 AM Pharmacy #: [id number]

## 2013-05-21 NOTE — Progress Notes (Signed)
K-2.5, pt received 6 runs of KCL. Dressing changed to Rt. Foot. Moderate amount of serous  Blood tinged drainage. Wet to dry dressing reapplied with kerlex wrap. The great toe and the next 2 toes appear purplish with some black on the back of the great toe. Pt has feeling and is able to move toes and foot. Rt. Foot elevated on pillows.

## 2013-05-21 NOTE — ED Provider Notes (Signed)
Medical screening examination/treatment/procedure(s) were conducted as a shared visit with non-physician practitioner(s) and myself.  I personally evaluated the patient during the encounter  Ulcer to right foot at 1st MTP draining foul smelling purulent material. Swelling and erythema to forefoot. Mild tachycardia. Appears nontoxic. Admit for IV antibiotics and likely I+D. BP 125/65  Pulse 102  Temp(Src) 98.2 F (36.8 C) (Oral)  Resp 18  Ht 5' 5.5" (1.664 m)  Wt 156 lb (70.761 kg)  BMI 25.56 kg/m2  SpO2 100%   Glynn Octave, MD 05/21/13 [phone]

## 2013-05-21 NOTE — H&P (Signed)
[facility] History and Physical  Tara Hopkins ZOX:[id number] DOB: 1951/03/11 DOA: 05/20/2013  Referring physician: [place]our PCP: Arlan Organ., MD   Chief Complaint: foot infection  HPI: Tara Hopkins is a 62 y.o. female who is diabetic and has had a foot ulcer for the past month. She was seen at a wound care center about a week ago and started on local wound care and what sounds like doxycycline. Her wound has not improved, and now her foot is red. She slipped and fell last night, stubbing her foot. She has had subjective fevers and chills. She has felt weak. She has peripheral neuropathy. Today, her foot began draining malodorous purulent material. She reports her blood sugars have been running about 160-170. In the emergency room, x-ray shows no osteomyelitis. White blood cell count is 22,000. Creatinine is 2. Baseline creatinine is unknown.  Review of Systems: Systems reviewed. As above otherwise negative  Past Medical History  Diagnosis Date  . Diabetes mellitus without complication   . Hypertension   . Hypercholesteremia   . Peripheral neuropathy   . Depression    retinopathy  History reviewed. No pertinent past surgical history. Social History: Does not smoke drink or use drugs. Works in [employer] for [employer]  No Known Allergies  Family history significant for diabetes   Prior to Admission medications   Medication Sig Start Date End Date Taking? Authorizing Provider  aspirin EC 81 MG tablet Take 81 mg by mouth daily.   Yes Historical Provider, MD  Calcium Carb-Cholecalciferol (CALCIUM + D3) 600-200 MG-UNIT TABS Take 1 tablet by mouth daily.   Yes Historical Provider, MD  cholecalciferol (VITAMIN D) 400 UNITS TABS Take 400 Units by mouth daily.   Yes Historical Provider, MD  COD LIVER OIL PO Take 1 capsule by mouth daily.   Yes Historical Provider, MD  collagenase (SANTYL) ointment Apply 1 application topically daily.   Yes Historical Provider, MD   ferrous gluconate (FERGON) 325 MG tablet Take 325 mg by mouth daily with breakfast.   Yes Historical Provider, MD  fish oil-omega-3 fatty acids 1000 MG capsule Take 1 g by mouth daily.   Yes Historical Provider, MD  gabapentin (NEURONTIN) 300 MG capsule Take 600 mg by mouth at bedtime.   Yes Historical Provider, MD  glimepiride (AMARYL) 4 MG tablet Take 4 mg by mouth daily before breakfast.   Yes Historical Provider, MD  hydrochlorothiazide (HYDRODIURIL) 25 MG tablet Take 25 mg by mouth daily.   Yes Historical Provider, MD  insulin glargine (LANTUS) 100 UNIT/ML injection Inject 60 Units into the skin at bedtime.   Yes Historical Provider, MD  lamoTRIgine (LAMICTAL) 25 MG tablet Take 25 mg by mouth daily.   Yes Historical Provider, MD  latanoprost (XALATAN) 0.005 % ophthalmic solution Place 1 drop into both eyes at bedtime.   Yes Historical Provider, MD  lisinopril (PRINIVIL,ZESTRIL) 5 MG tablet Take 5 mg by mouth daily.   Yes Historical Provider, MD  Lurasidone HCl (LATUDA) 60 MG TABS Take 60 mg by mouth daily.   Yes Historical Provider, MD  rosuvastatin (CRESTOR) 10 MG tablet Take 10 mg by mouth daily.   Yes Historical Provider, MD  saxagliptin HCl (ONGLYZA) 5 MG TABS tablet Take 5 mg by mouth daily.   Yes Historical Provider, MD  sertraline (ZOLOFT) 50 MG tablet Take 50 mg by mouth daily.   Yes Historical Provider, MD  thiamine 100 MG tablet Take 100 mg by mouth daily.   Yes Historical Provider,  MD  vitamin C (ASCORBIC ACID) 500 MG tablet Take 500 mg by mouth daily.   Yes Historical Provider, MD  vitamin E 100 UNIT capsule Take 100 Units by mouth daily.   Yes Historical Provider, MD   Physical Exam: Filed Vitals:   05/20/13 1923 05/20/13 2347 05/21/13 0025  BP: 154/76 114/58 125/65  Pulse: 108 99 102  Temp: 98.9 F (37.2 C) 100 F (37.8 C) 98.2 F (36.8 C)  TempSrc: Oral Oral Oral  Resp: 20 17 18   Height:  5\' 5"  (1.651 m) 5' 5.5" (1.664 m)  Weight:  70.761 kg (156 lb) 70.761 kg (156  lb)  SpO2: 97% 96% 100%   BP 125/65  Pulse 102  Temp(Src) 98.2 F (36.8 C) (Oral)  Resp 18  Ht 5' 5.5" (1.664 m)  Wt 70.761 kg (156 lb)  BMI 25.56 kg/m2  SpO2 100%  General Appearance:    Alert, cooperative, no distress, AA female  Head:    Normocephalic, without obvious abnormality, atraumatic  Eyes:    PERRL, conjunctiva/corneas clear, EOM's intact, fundi    benign, both eyes     Nose:   Nares normal, septum midline, mucosa normal, no drainage    or sinus tenderness  Throat:   Lips, mucosa, and tongue normal; teeth and gums normal  Neck:   Supple, symmetrical, trachea midline, no adenopathy;    thyroid:  no enlargement/tenderness/nodules; no carotid   bruit or JVD  Back:     Symmetric, no curvature, ROM normal, no CVA tenderness  Lungs:     Clear to auscultation bilaterally, respirations unlabored  Chest Wall:    No tenderness or deformity   Heart:    Regular rate and rhythm, S1 and S2 normal, no murmur, rub   or gallop     Abdomen:     Soft, non-tender, bowel sounds active all four quadrants,    no masses, no organomegaly  Genitalia:   deferred  Rectal:   deferred  Extremities:   right foot with erythema induration and warmth on the dorsal surface. Great toe and second toe indurated, erythematous with dark discoloration and fluctuance. 2-3 cm ulceration  First MTP with malodorous purulent drainage.   Pulses:   diminished   Skin:   see above   Lymph nodes:   Cervical, supraclavicular, and axillary nodes normal  Neurologic:   CNII-XII intact, normal strength, sensation and reflexes    throughout    psychiatric: Normal affect.  Labs on Admission:  Basic Metabolic Panel:  Recent Labs Lab 05/20/13 2123  NA 129*  K 3.2*  CL 93*  GLUCOSE 192*  BUN 24*  CREATININE 2.00*   Liver Function Tests: No results found for this basename: AST, ALT, ALKPHOS, BILITOT, PROT, ALBUMIN,  in the last 168 hours No results found for this basename: LIPASE, AMYLASE,  in the last 168  hours No results found for this basename: AMMONIA,  in the last 168 hours CBC:  Recent Labs Lab 05/20/13 2115 05/20/13 2123  WBC 22.2*  --   NEUTROABS 18.4*  --   HGB 10.1* 10.9*  HCT 29.7* 32.0*  MCV 84.4  --   PLT 596*  --    Cardiac Enzymes: No results found for this basename: CKTOTAL, CKMB, CKMBINDEX, TROPONINI,  in the last 168 hours  BNP (last 3 results) No results found for this basename: PROBNP,  in the last 8760 hours CBG:  Recent Labs Lab 05/20/13 2221  GLUCAP 174*    Radiological Exams on  Admission: Dg Foot Complete Right  05/20/2013   *RADIOLOGY REPORT*  Clinical Data: Nonhealing wound in the plantar aspect of the right foot at the MTP joint.  RIGHT FOOT COMPLETE - 3+ VIEW  Comparison: None.  Findings: Bandage material at the level of the first MTP and the distal first metatarsal.  No bone destruction, periosteal reaction or soft tissue gas.  Moderate-sized inferior and small posterior calcaneal spurs.  Arterial calcification.  IMPRESSION: No radiographic evidence of osteomyelitis.   Original Report Authenticated By: Beckie Salts, M.D.    Assessment/Plan Principal Problem:   Diabetic foot infection with ulcer and abcess, right: likely polymicrobial. Continue Zosyn and vancomycin.  Abcess draining small amounts, but appears to involves the great toe, and potentially plantar aspect of the foot. Will likely need I&D, possibly more extensive surgery. Will order MRI of the foot and will need orthopedic consult in the morning. Will also check CRP    DM type 2 with diabetic peripheral neuropathy    Benign hypertension    Depression    Hypokalemia: replete    Hyponatremia: monitor    Anemia    Renal insufficiency: Baseline creatinine unknown. Will hold ACE inhibitor for now and follow. hydrate   Code Status: full Family Communication: none Disposition Plan: home  Time spent: 60 minutes  Trayvon Trumbull L [facility] Pager [phone]  If 7PM-7AM,  please contact night-coverage [email or web address] Password [facility] 05/21/2013, 12:54 AM

## 2013-05-22 ENCOUNTER — Inpatient Hospital Stay (HOSPITAL_COMMUNITY): Payer: BC Managed Care – PPO

## 2013-05-22 LAB — CBC
HCT: 23.2 % — ABNORMAL LOW (ref 36.0–46.0)
Hemoglobin: 7.9 g/dL — ABNORMAL LOW (ref 12.0–15.0)
MCH: 28.8 pg (ref 26.0–34.0)
MCHC: 34.1 g/dL (ref 30.0–36.0)

## 2013-05-22 LAB — HEMOGLOBIN A1C: Mean Plasma Glucose: 367 mg/dL — ABNORMAL HIGH (ref <117)

## 2013-05-22 LAB — BASIC METABOLIC PANEL
BUN: 20 mg/dL (ref 6–23)
Calcium: 8.9 mg/dL (ref 8.4–10.5)
GFR calc non Af Amer: 39 mL/min — ABNORMAL LOW (ref >90)
Glucose, Bld: 99 mg/dL (ref 70–99)

## 2013-05-22 LAB — GLUCOSE, CAPILLARY
Glucose-Capillary: 74 mg/dL (ref 70–99)
Glucose-Capillary: 60 mg/dL — ABNORMAL LOW (ref 70–99)

## 2013-05-22 MED ORDER — OXYCODONE-ACETAMINOPHEN 5-325 MG PO TABS
1.0000 | ORAL_TABLET | ORAL | Status: DC | PRN
  Administered 2013-05-22 – 2013-05-23 (×4): 1 via ORAL
  Administered 2013-05-23 (×2): 2 via ORAL
  Administered 2013-05-24: 1 via ORAL
  Administered 2013-05-24 – 2013-05-26 (×10): 2 via ORAL
  Filled 2013-05-22 (×2): qty 1
  Filled 2013-05-22 (×6): qty 2
  Filled 2013-05-22: qty 1
  Filled 2013-05-22: qty 2
  Filled 2013-05-22 (×2): qty 1
  Filled 2013-05-22 (×5): qty 2

## 2013-05-22 MED ORDER — HYDROMORPHONE HCL PF 1 MG/ML IJ SOLN
1.0000 mg | INTRAMUSCULAR | Status: DC | PRN
  Administered 2013-05-24 (×2): 1 mg via INTRAVENOUS
  Filled 2013-05-22 (×2): qty 1

## 2013-05-22 MED ORDER — LORAZEPAM 2 MG/ML IJ SOLN
1.0000 mg | Freq: Four times a day (QID) | INTRAMUSCULAR | Status: DC | PRN
  Administered 2013-05-22 – 2013-05-25 (×5): 1 mg via INTRAVENOUS
  Filled 2013-05-22 (×5): qty 1

## 2013-05-22 MED ORDER — NICOTINE 21 MG/24HR TD PT24
21.0000 mg | MEDICATED_PATCH | Freq: Every day | TRANSDERMAL | Status: DC
  Administered 2013-05-22 – 2013-05-26 (×5): 21 mg via TRANSDERMAL
  Filled 2013-05-22 (×5): qty 1

## 2013-05-22 NOTE — Progress Notes (Signed)
Patient ID: Tara Hopkins, female   DOB: [birth year], 62 y.o.   MRN: [id number] [facility] PROGRESS NOTE  Tara Hopkins WUJ:[id number] DOB: September 26, 1951 DOA: 06/18/13 PCP: Arlan Organ., MD  Brief narrative:  Pt is 62 y.o. female who is diabetic and has had a foot ulcer for the past month. She was seen at a wound care center about a week ago and started on local wound care and what sounds like doxycycline. Her wound has not improved with that regimen. She slipped and fell one night prior to admission, stubbing her foot. This was associated with malodorous, yellow discharge from the wound, purulent, no blood noted. Pt also reported subjective fevers, chills, no similar events in the past.   In the emergency room, x-ray shows no osteomyelitis. White blood cell count is 22,000. Creatinine is 2.   Principal Problem:  Diabetic foot infection with ulcer and abcess, right  - MRI of the right foot ordered and still pending  - will continue broad spectrum ABX Vancomycin and Zozyn day #2, will ask ortho team for further assistance  - WBC trending down, pt still with low grade fever, Tmax = 100 F  - ensure diabetes control  Active Problems:  DM type 2 with diabetic peripheral neuropathy  - A1C > 14, continue home regimen with insulin  - continue SSI, moderate coverage  - diabetic educator consultation pending  Benign hypertension  - reasonable inpatient control  Depression  - stable and at baseline, emotional support provided and pt appreciated our concern   Hypokalemia  - within normal limits this AM, d/c IV supplementation  - Mg is within normal limits  Hyponatremia  - secondary to pre renal etiology, repeat BMP in AM  Anemia of chronic disease  - slight drop in Hg since admission, likely dilutional component  - overall Hg stable, no signs of acute bleed  - repeat CBC in AM  Acute renal failure  - likely pre renal etiology imposed in chronic renal failure stage II  - not  clear what the baseline creatinine is but pt aware of chronic kidney damage  - IVF provided and pt improving, creatinine is trending down  - repeat BMP in AM   Consultants:  Ortho Procedures/Studies:  Dg Foot Complete Right 2013/06/18 No radiographic evidence of osteomyelitis.  Antibiotics:  Vancomycin 06-19-23 -->  Zosyn 2023/06/19 -->  Code Status: Full  Family Communication: Pt at bedside  Disposition Plan: Home when medically stable   HPI/Subjective: No events overnight.   Objective: Filed Vitals:   05/21/13 1430 05/21/13 1836 05/21/13 2031 05/22/13 0440  BP: 116/65  109/61 91/53  Pulse: 97  89 85  Temp: 100.2 F (37.9 C) 99.1 F (37.3 C) 99.3 F (37.4 C) 98.3 F (36.8 C)  TempSrc: Oral Oral Oral Oral  Resp: 18  16 18   Height:      Weight:      SpO2: 95%  98% 96%    Intake/Output Summary (Last 24 hours) at 05/22/13 1323 Last data filed at 05/22/13 1032  Gross per 24 hour  Intake 2920.5 ml  Output      0 ml  Net 2920.5 ml    Exam:   General:  Pt is alert, follows commands appropriately, not in acute distress  Cardiovascular: Regular rate and rhythm, S1/S2, no murmurs, no rubs, no gallops  Respiratory: Clear to auscultation bilaterally, no wheezing, no crackles, no rhonchi  Abdomen: Soft, non tender, non distended, bowel sounds present, no guarding  Neuro: Grossly  nonfocal  Data Reviewed: Basic Metabolic Panel:  Recent Labs Lab 05/20/13 2123 05/21/13 0433 05/21/13 1455 05/22/13 0433  NA 129* 128* 125* 129*  K 3.2* 2.5* 3.5 4.6  CL 93* 90* 91* 94*  CO2  --  27 24 23   GLUCOSE 192* 249* 183* 99  BUN 24* 27* 23 20  CREATININE 2.00* 1.69* 1.44* 1.42*  CALCIUM  --  9.3 9.1 8.9  MG  --  2.0  --   --    CBC:  Recent Labs Lab 05/20/13 2115 05/20/13 2123 05/21/13 0433 05/22/13 0433  WBC 22.2*  --  15.6* 14.2*  NEUTROABS 18.4*  --  12.6*  --   HGB 10.1* 10.9* 8.9* 7.9*  HCT 29.7* 32.0* 26.5* 23.2*  MCV 84.4  --  83.3 84.7  PLT 596*  --  536* 524*    CBG:  Recent Labs Lab 05/21/13 0728 05/21/13 1205 05/21/13 1653 05/21/13 2123 05/22/13 0750  GLUCAP 165* 178* 92 106* 83    Recent Results (from the past 240 hour(s))  CULTURE, ROUTINE-ABSCESS     Status: None   Collection Time    05/21/13  1:36 AM      Result Value Range Status   Specimen Description WOUND FOOT   Final   Special Requests NONE   Final   Gram Stain PENDING   Incomplete   Culture Culture reincubated for better growth   Final   Report Status PENDING   Incomplete     Scheduled Meds: . aspirin EC  81 mg Oral Daily  . atorvastatin  20 mg Oral q1800  . cholecalciferol  400 Units Oral Daily  . enoxaparin (LOVENOX) injection  40 mg Subcutaneous Q24H  . ferrous gluconate  325 mg Oral Q breakfast  . gabapentin  600 mg Oral QHS  . glimepiride  4 mg Oral QAC breakfast  . insulin aspart  0-15 Units Subcutaneous TID WC  . insulin glargine  60 Units Subcutaneous QHS  . lamoTRIgine  25 mg Oral Daily  . latanoprost  1 drop Both Eyes QHS  . linagliptin  5 mg Oral Daily  . lurasidone  60 mg Oral Daily  . nicotine  21 mg Transdermal Daily  . piperacillin-tazobactam (ZOSYN)  IV  3.375 g Intravenous Q8H  . potassium chloride  40 mEq Oral TID  . sertraline  50 mg Oral Daily  . thiamine  100 mg Oral Daily  . vancomycin  1,000 mg Intravenous Q24H  . vitamin C  500 mg Oral Daily   Continuous Infusions:   Debbora Presto, MD  [facility] Pager [phone]  If 7PM-7AM, please contact night-coverage [email or web address] Password [email or web address] 05/22/2013, 1:23 PM   LOS: 2 days

## 2013-05-22 NOTE — Consult Note (Signed)
  Discussed case with [facility] service. Full note to follow  Awaiting MRI of foot to better evaluate for abscess Continue IV antibiotic treatment for now

## 2013-05-22 NOTE — Progress Notes (Signed)
Inpatient Diabetes Program Recommendations  AACE/ADA: New Consensus Statement on Inpatient Glycemic Control (2013)  Target Ranges:  Prepandial:   less than 140 mg/dL      Peak postprandial:   less than 180 mg/dL (1-2 hours)      Critically ill patients:  140 - 180 mg/dL   Reason for Visit: Diabetes Consult  "My doctor in [place] gives me my Lantus."  Pt states she is homeless, but just got an apt recently. On Lantus 60 QHS, Onglyza 5 mg/day, Amaryl 4 mg/day.  Discussed results and meaning of HgbA1C of 14.4%.  States she checks blood sugars at home, but it's recently been running high all the time.  Results for Tara Hopkins, Tara Hopkins (MRN [id number]) as of 05/22/2013 15:35  Ref. Range 05/21/2013 07:28 05/21/2013 12:05 05/21/2013 16:53 05/21/2013 21:23 05/22/2013 07:50  Glucose-Capillary Latest Range: 70-99 mg/dL 956 (H) 213 (H) 92 086 (H) 83  Results for Tara Hopkins, Tara Hopkins (MRN [id number]) as of 05/22/2013 15:35  Ref. Range 05/21/2013 04:33  Hemoglobin A1C Latest Range: <5.7 % 14.4 (H)  Results for Tara Hopkins, Tara Hopkins (MRN [id number]) as of 05/22/2013 15:35  Ref. Range 05/22/2013 04:33  Sodium Latest Range: 135-145 mEq/L 129 (L)  Potassium Latest Range: 3.5-5.1 mEq/L 4.6  Chloride Latest Range: 96-112 mEq/L 94 (L)  CO2 Latest Range: 19-32 mEq/L 23  BUN Latest Range: 6-23 mg/dL 20  Creatinine Latest Range: 0.50-1.10 mg/dL 0.10 (H)  Calcium Latest Range: 8.4-10.5 mg/dL 8.9  GFR calc non Af Amer Latest Range: >90 mL/min 39 (L)  GFR calc Af Amer Latest Range: >90 mL/min 45 (L)  Glucose Latest Range: 70-99 mg/dL 99   Uncontrolled DM. For possible surgery tomorrow am.  Recommendations: If pt is NPO after MN for surgery, Decrease Lantus to 30 units QHS (1 time order) While NPO, change Novolog moderate to Q4 hours.  Will f/u on 7/15,  Thank you. Ailene Ards, RD, LDN, CDE Inpatient Diabetes Coordinator [phone]

## 2013-05-22 NOTE — Consult Note (Signed)
Reason for Consult: Right great toe diabetic ulcer Referring Physician:  [facility], Tara Hopkins is an 62 y.o. female.  HPI: Pt is 62 y.o. female who is diabetic and has had a foot ulcer for the past month. She was seen at a wound care center about a week ago and started on local wound care and what sounds like doxycycline. Her wound has not improved with that regimen. She slipped and fell one night prior to admission, stubbing her foot. This was associated with malodorous, yellow discharge from the wound, purulent, no blood noted. Pt also reported subjective fevers, chills, no similar events in the past.   She states that she has not been able to pay for all the medications prescribed from the wound clinic.  She states that the ulcer really got worse over past week   Past Medical History  Diagnosis Date  . Diabetes mellitus without complication   . Hypertension   . Hypercholesteremia   . Peripheral neuropathy   . Depression     History reviewed. No pertinent past surgical history.  History reviewed. No pertinent family history.  Social History:  reports that she has been smoking Cigarettes.  She has been smoking about 0.00 packs per day. She does not have any smokeless tobacco history on file. She reports that she does not drink alcohol or use illicit drugs.  Allergies: No Known Allergies  Medications:  I have reviewed the patient's current medications. Scheduled: . aspirin EC  81 mg Oral Daily  . atorvastatin  20 mg Oral q1800  . cholecalciferol  400 Units Oral Daily  . enoxaparin (LOVENOX) injection  40 mg Subcutaneous Q24H  . ferrous gluconate  325 mg Oral Q breakfast  . gabapentin  600 mg Oral QHS  . glimepiride  4 mg Oral QAC breakfast  . insulin aspart  0-15 Units Subcutaneous TID WC  . insulin glargine  60 Units Subcutaneous QHS  . lamoTRIgine  25 mg Oral Daily  . latanoprost  1 drop Both Eyes QHS  . linagliptin  5 mg Oral Daily  . lurasidone  60 mg Oral  Daily  . piperacillin-tazobactam (ZOSYN)  IV  3.375 g Intravenous Q8H  . potassium chloride  40 mEq Oral TID  . sertraline  50 mg Oral Daily  . thiamine  100 mg Oral Daily  . vancomycin  1,000 mg Intravenous Q24H  . vitamin C  500 mg Oral Daily    Results for orders placed during the hospital encounter of 05/20/13 (from the past 24 hour(s))  GLUCOSE, CAPILLARY     Status: Abnormal   Collection Time    05/21/13 12:05 PM      Result Value Range   Glucose-Capillary 178 (*) 70 - 99 mg/dL  BASIC METABOLIC PANEL     Status: Abnormal   Collection Time    05/21/13  2:55 PM      Result Value Range   Sodium 125 (*) 135 - 145 mEq/L   Potassium 3.5  3.5 - 5.1 mEq/L   Chloride 91 (*) 96 - 112 mEq/L   CO2 24  19 - 32 mEq/L   Glucose, Bld 183 (*) 70 - 99 mg/dL   BUN 23  6 - 23 mg/dL   Creatinine, Ser 1.61 (*) 0.50 - 1.10 mg/dL   Calcium 9.1  8.4 - 09.6 mg/dL   GFR calc non Af Amer 39 (*) >90 mL/min   GFR calc Af Amer 45 (*) >90 mL/min  GLUCOSE, CAPILLARY  Status: None   Collection Time    05/21/13  4:53 PM      Result Value Range   Glucose-Capillary 92  70 - 99 mg/dL  GLUCOSE, CAPILLARY     Status: Abnormal   Collection Time    05/21/13  9:23 PM      Result Value Range   Glucose-Capillary 106 (*) 70 - 99 mg/dL   Comment 1 Notify RN    CBC     Status: Abnormal   Collection Time    05/22/13  4:33 AM      Result Value Range   WBC 14.2 (*) 4.0 - 10.5 K/uL   RBC 2.74 (*) 3.87 - 5.11 MIL/uL   Hemoglobin 7.9 (*) 12.0 - 15.0 g/dL   HCT 45.4 (*) 09.8 - 11.9 %   MCV 84.7  78.0 - 100.0 fL   MCH 28.8  26.0 - 34.0 pg   MCHC 34.1  30.0 - 36.0 g/dL   RDW 14.7  82.9 - 56.2 %   Platelets 524 (*) 150 - 400 K/uL  BASIC METABOLIC PANEL     Status: Abnormal   Collection Time    05/22/13  4:33 AM      Result Value Range   Sodium 129 (*) 135 - 145 mEq/L   Potassium 4.6  3.5 - 5.1 mEq/L   Chloride 94 (*) 96 - 112 mEq/L   CO2 23  19 - 32 mEq/L   Glucose, Bld 99  70 - 99 mg/dL   BUN 20  6 -  23 mg/dL   Creatinine, Ser 1.30 (*) 0.50 - 1.10 mg/dL   Calcium 8.9  8.4 - 86.5 mg/dL   GFR calc non Af Amer 39 (*) >90 mL/min   GFR calc Af Amer 45 (*) >90 mL/min  GLUCOSE, CAPILLARY     Status: None   Collection Time    05/22/13  7:50 AM      Result Value Range   Glucose-Capillary 83  70 - 99 mg/dL     X-ray: *RIGHT FOOT COMPLETE - 3+ VIEW   Comparison: None.  Findings: Bandage material at the level of the first MTP and the  distal first metatarsal. No bone destruction, periosteal reaction  or soft tissue gas. Moderate-sized inferior and small posterior  calcaneal spurs. Arterial calcification.   IMPRESSION: No radiographic evidence of osteomyelitis.   ROS: Worsening right foot ulcer with fever chills  No recent productive cough, chest pain shortness of breath No abdominal discomfort  Blood pressure 91/53, pulse 85, temperature 98.3 F (36.8 C), temperature source Oral, resp. rate 18, height 5' 5.5" (1.664 m), weight 70.761 kg (156 lb), SpO2 96.00%.  EXAM: Pleasant female in no acute distress Discussion about condition of her foot was upsetting to her and led to tears and even emesis   Right foot with plantar medial ulcer at level of MTP with purulence behind her skin Foul smell Decreased sensitivity but palpable pulses  Left foot normal at this poin  Assessment/Plan: Right foot great toe plantar based diabetic ulcer worsening  Awaiting the MRI to evaluate extent of condition of the toe ie, bone involvement Discussed possibilities of debridement versus amputation Once MRI is complete I will have a follow up discussion with her  NPO after midnight for surgery tomorrow  Tara Hopkins D 05/22/2013, 11:43 AM

## 2013-05-22 NOTE — Progress Notes (Signed)
Dr. Charlann Boxer in to speak to pt. About going to the OR 05/23/13. Explained possible options. Pt became emotional and physically ill when told of Possibly having to have an  amputation of the Rt. Great. Family at bedside for support.

## 2013-05-23 ENCOUNTER — Encounter (HOSPITAL_COMMUNITY)
Admission: EM | Disposition: A | Discharge status: Home Health | Payer: Self-pay | Source: Home / Self Care | Attending: Internal Medicine

## 2013-05-23 ENCOUNTER — Encounter (HOSPITAL_COMMUNITY): Payer: Self-pay

## 2013-05-23 ENCOUNTER — Encounter (HOSPITAL_COMMUNITY): Payer: Self-pay | Admitting: Anesthesiology

## 2013-05-23 ENCOUNTER — Inpatient Hospital Stay (HOSPITAL_COMMUNITY): Payer: BC Managed Care – PPO | Admitting: Anesthesiology

## 2013-05-23 HISTORY — PX: I&D EXTREMITY: SHX5045

## 2013-05-23 LAB — GLUCOSE, CAPILLARY
Glucose-Capillary: 47 mg/dL — ABNORMAL LOW (ref 70–99)
Glucose-Capillary: 85 mg/dL (ref 70–99)
Glucose-Capillary: 142 mg/dL — ABNORMAL HIGH (ref 70–99)

## 2013-05-23 LAB — PREPARE RBC (CROSSMATCH)

## 2013-05-23 LAB — BASIC METABOLIC PANEL
CO2: 24 mEq/L (ref 19–32)
Chloride: 100 mEq/L (ref 96–112)
Potassium: 4.1 mEq/L (ref 3.5–5.1)
Sodium: 133 mEq/L — ABNORMAL LOW (ref 135–145)

## 2013-05-23 LAB — CBC
MCV: 85.5 fL (ref 78.0–100.0)
Platelets: 551 10*3/uL — ABNORMAL HIGH (ref 150–400)
RBC: 2.62 MIL/uL — ABNORMAL LOW (ref 3.87–5.11)
WBC: 11.9 10*3/uL — ABNORMAL HIGH (ref 4.0–10.5)

## 2013-05-23 LAB — SURGICAL PCR SCREEN: Staphylococcus aureus: NEGATIVE

## 2013-05-23 SURGERY — IRRIGATION AND DEBRIDEMENT EXTREMITY
Anesthesia: Monitor Anesthesia Care | Site: Foot | Laterality: Right | Wound class: Dirty or Infected

## 2013-05-23 MED ORDER — MIDAZOLAM HCL 5 MG/5ML IJ SOLN
INTRAMUSCULAR | Status: DC | PRN
  Administered 2013-05-23: 2 mg via INTRAVENOUS

## 2013-05-23 MED ORDER — DEXTROSE 50 % IV SOLN
INTRAVENOUS | Status: AC
  Administered 2013-05-23: 50 mL
  Filled 2013-05-23: qty 50

## 2013-05-23 MED ORDER — PROPOFOL 10 MG/ML IV BOLUS
INTRAVENOUS | Status: DC | PRN
  Administered 2013-05-23: 70 mg via INTRAVENOUS

## 2013-05-23 MED ORDER — PROMETHAZINE HCL 25 MG/ML IJ SOLN: 6.2500 mg | INTRAMUSCULAR | Status: DC | PRN

## 2013-05-23 MED ORDER — LACTATED RINGERS IV SOLN
INTRAVENOUS | Status: DC
  Administered 2013-05-23: 1000 mL via INTRAVENOUS

## 2013-05-23 MED ORDER — ASPIRIN EC 325 MG PO TBEC
325.0000 mg | DELAYED_RELEASE_TABLET | Freq: Every day | ORAL | Status: DC
  Administered 2013-05-23 – 2013-05-26 (×4): 325 mg via ORAL
  Filled 2013-05-23 (×4): qty 1

## 2013-05-23 MED ORDER — SODIUM CHLORIDE 0.9 % IV SOLN
INTRAVENOUS | Status: DC | PRN
  Administered 2013-05-23: 15:00:00 via INTRAVENOUS

## 2013-05-23 MED ORDER — FENTANYL CITRATE 0.05 MG/ML IJ SOLN
INTRAMUSCULAR | Status: DC | PRN
  Administered 2013-05-23: 100 ug via INTRAVENOUS

## 2013-05-23 MED ORDER — DEXTROSE 50 % IV SOLN
25.0000 mL | Freq: Once | INTRAVENOUS | Status: AC | PRN
  Administered 2013-05-23: 25 mL via INTRAVENOUS
  Filled 2013-05-23: qty 50

## 2013-05-23 MED ORDER — PIPERACILLIN-TAZOBACTAM 3.375 G IVPB
3.3750 g | INTRAVENOUS | Status: AC
  Administered 2013-05-23: 3.375 g via INTRAVENOUS
  Filled 2013-05-23: qty 50

## 2013-05-23 MED ORDER — DEXTROSE 50 % IV SOLN
25.0000 mL | Freq: Once | INTRAVENOUS | Status: AC | PRN
  Administered 2013-05-23: 25 mL via INTRAVENOUS

## 2013-05-23 MED ORDER — FENTANYL CITRATE 0.05 MG/ML IJ SOLN: 25.0000 ug | INTRAMUSCULAR | Status: DC | PRN

## 2013-05-23 MED ORDER — MORPHINE SULFATE 2 MG/ML IJ SOLN: 2.0000 mg | INTRAMUSCULAR | Status: DC | PRN

## 2013-05-23 MED ORDER — PROPOFOL INFUSION 10 MG/ML OPTIME
INTRAVENOUS | Status: DC | PRN
  Administered 2013-05-23: 140 ug/kg/min via INTRAVENOUS

## 2013-05-23 SURGICAL SUPPLY — 41 items
BAG ZIPLOCK 12X15 (MISCELLANEOUS) ×2 IMPLANT
BANDAGE ELASTIC 4 VELCRO ST LF (GAUZE/BANDAGES/DRESSINGS) ×2 IMPLANT
BANDAGE ESMARK 6X9 LF (GAUZE/BANDAGES/DRESSINGS) ×1 IMPLANT
BANDAGE GAUZE ELAST BULKY 4 IN (GAUZE/BANDAGES/DRESSINGS) ×2 IMPLANT
BNDG ESMARK 6X9 LF (GAUZE/BANDAGES/DRESSINGS) ×2
CLOTH BEACON ORANGE TIMEOUT ST (SAFETY) ×2 IMPLANT
CUFF TOURN SGL QUICK 18 (TOURNIQUET CUFF) IMPLANT
CUFF TOURN SGL QUICK 24 (TOURNIQUET CUFF)
CUFF TOURN SGL QUICK 34 (TOURNIQUET CUFF)
CUFF TRNQT CYL 24X4X40X1 (TOURNIQUET CUFF) IMPLANT
CUFF TRNQT CYL 34X4X40X1 (TOURNIQUET CUFF) IMPLANT
DRAIN PENROSE 18X1/2 LTX STRL (DRAIN) IMPLANT
DRSG PAD ABDOMINAL 8X10 ST (GAUZE/BANDAGES/DRESSINGS) IMPLANT
DURAPREP 26ML APPLICATOR (WOUND CARE) IMPLANT
ELECT REM PT RETURN 9FT ADLT (ELECTROSURGICAL) ×2
ELECTRODE REM PT RTRN 9FT ADLT (ELECTROSURGICAL) ×1 IMPLANT
GAUZE PACKING IODOFORM 1/4X5 (PACKING) ×2 IMPLANT
GAUZE XEROFORM 5X9 LF (GAUZE/BANDAGES/DRESSINGS) ×2 IMPLANT
GLOVE BIOGEL PI IND STRL 7.5 (GLOVE) ×1 IMPLANT
GLOVE BIOGEL PI IND STRL 8 (GLOVE) ×1 IMPLANT
GLOVE BIOGEL PI INDICATOR 7.5 (GLOVE) ×1
GLOVE BIOGEL PI INDICATOR 8 (GLOVE) ×1
GLOVE ECLIPSE 8.0 STRL XLNG CF (GLOVE) IMPLANT
GLOVE ORTHO TXT STRL SZ7.5 (GLOVE) ×4 IMPLANT
GLOVE SURG ORTHO 8.0 STRL STRW (GLOVE) ×2 IMPLANT
GOWN BRE IMP PREV XXLGXLNG (GOWN DISPOSABLE) ×2 IMPLANT
GOWN STRL NON-REIN LRG LVL3 (GOWN DISPOSABLE) ×2 IMPLANT
HANDPIECE INTERPULSE COAX TIP (DISPOSABLE) ×1
KIT BASIN OR (CUSTOM PROCEDURE TRAY) ×2 IMPLANT
MANIFOLD NEPTUNE II (INSTRUMENTS) ×2 IMPLANT
PACK LOWER EXTREMITY WL (CUSTOM PROCEDURE TRAY) ×2 IMPLANT
PAD CAST 4YDX4 CTTN HI CHSV (CAST SUPPLIES) ×1 IMPLANT
PADDING CAST COTTON 4X4 STRL (CAST SUPPLIES) ×1
POSITIONER SURGICAL ARM (MISCELLANEOUS) ×2 IMPLANT
SET HNDPC FAN SPRY TIP SCT (DISPOSABLE) ×1 IMPLANT
SOL PREP PROV IODINE SCRUB 4OZ (MISCELLANEOUS) ×2 IMPLANT
SPONGE GAUZE 4X4 12PLY (GAUZE/BANDAGES/DRESSINGS) ×2 IMPLANT
SUT ETHILON 2 0 PS N (SUTURE) ×2 IMPLANT
SUT ETHILON 4 0 PS 2 18 (SUTURE) ×4 IMPLANT
SYR CONTROL 10ML LL (SYRINGE) IMPLANT
TOWEL OR 17X26 10 PK STRL BLUE (TOWEL DISPOSABLE) ×2 IMPLANT

## 2013-05-23 NOTE — Brief Op Note (Signed)
05/20/2013 - 05/23/2013  4:02 PM  PATIENT:  Tara Hopkins  62 y.o. female  PRE-OPERATIVE DIAGNOSIS:  RIGHT FOOT 1.  GREAT TOE DIABETIC INFECTION, 2.  2nd toe abscess, dorsally  POST-OPERATIVE DIAGNOSIS:  RIGHT FOOT 1.  GREAT TOE DIABETIC INFECTION, 2.  2nd toe abscess, dorsally  PROCEDURE:  Procedure(s): 1. Excisional debridement with scalpel right great toe and 2nd toe including skin, subcutaneous tissue 2. Non-excisional debridement of right great toe and 2nd toe with 3 liters NS via pulse lavage 3 application of wound vac right great toe medially  SURGEON:  Surgeon(s) and Role:    * Shelda Pal, MD - Primary  PHYSICIAN ASSISTANT: Lanney Gins, PA-C  ANESTHESIA:   general  EBL:  Total I/O In: 950 [I.V.:950] Out: 50 [Blood:50]  BLOOD ADMINISTERED:none  DRAINS: none    LOCAL MEDICATIONS USED:  NONE  SPECIMEN:  Source of Specimen:  right foot abscess  DISPOSITION OF SPECIMEN:  PATHOLOGY  COUNTS:  YES  TOURNIQUET:  * No tourniquets in log *  DICTATION: .Other Dictation: Dictation Number [id number]  PLAN OF CARE: Admit to inpatient   PATIENT DISPOSITION:  PACU - hemodynamically stable.   Delay start of Pharmacological VTE agent (>24hrs) due to surgical blood loss or risk of bleeding: no

## 2013-05-23 NOTE — Transfer of Care (Signed)
Immediate Anesthesia Transfer of Care Note  [name]  Procedure(s) Performed: Procedure(s): IRRIGATION AND DEBRIDEMENT RIGHT GREAT TOE (Right)  Patient Location: PACU  Anesthesia Type:General  Level of Consciousness: awake, alert  and oriented  Airway & Oxygen Therapy: Patient Spontanous Breathing and Patient connected to face mask oxygen  Post-op Assessment: Report given to PACU RN and Post -op Vital signs reviewed and stable  Post vital signs: Reviewed and stable  Complications: No apparent anesthesia complications

## 2013-05-23 NOTE — Progress Notes (Signed)
Patient ID: Tara Hopkins, female   DOB: [birth year], 62 y.o.   MRN: [id number]   Patient seen today to review MRI findings Very anxious, tearful  Receiving 2 units PRBCs for anemia of chronic disease  Right foot dressing in place, no change on exam with regards to healing/smell    MRI OF THE RIGHT FOREFOOT WITHOUT CONTRAST   Technique: Multiplanar, multisequence MR imaging was performed. No  intravenous contrast was administered.  Comparison: Plain films 05/20/2013.  Findings: There is marked subcutaneous edema over the dorsum of the  foot. The patient has a fluid collection centered over the second,  third and fourth metatarsals. The collection measures  approximately 3.1 cm transverse by 0.9 cm cranial-caudal by 6.0 cm  long. The collection extends from posterolateral in an anterior  and medial orientation towards the first and second toes. There is  some fluid in the sheath of the extensor tendon of the second toe  worrisome for infectious tenosynovitis. No bone marrow signal  abnormality to suggest osteomyelitis is identified. Small second  MTP joint effusion is noted.  IMPRESSION:   Findings consistent with cellulitis and abscess formation over the  dorsum of the foot as described. Fluid in the sheath of the  extensor tendon of the second toe is worrisome for septic  tenosynovitis. There is no evidence of osteomyelitis.   Original Report Authenticated By: [name] Dexter, M.D.  Assessment: Right forefoot diabetic infection  Plan: To OR today for excisional debridement of right foot - no amputation planned for today She is relieved to know there is at least a chance to avoid amputation at this point NPO Consent on chart  To receive ativan now for anxiety  Receiving 2 units PRBCs now

## 2013-05-23 NOTE — Anesthesia Postprocedure Evaluation (Signed)
  Anesthesia Post-op Note  [name]  Procedure(s) Performed: Procedure(s) (LRB): IRRIGATION AND DEBRIDEMENT RIGHT GREAT TOE (Right)  Patient Location: PACU  Anesthesia Type: General  Level of Consciousness: awake and alert   Airway and Oxygen Therapy: Patient Spontanous Breathing  Post-op Pain: mild  Post-op Assessment: Post-op Vital signs reviewed, Patient's Cardiovascular Status Stable, Respiratory Function Stable, Patent Airway and No signs of Nausea or vomiting  Last Vitals:  Filed Vitals:   05/23/13 1700  BP: 112/63  Pulse: 80  Temp: 36.5 C  Resp: 18    Post-op Vital Signs: stable   Complications: No apparent anesthesia complications

## 2013-05-23 NOTE — Progress Notes (Signed)
Patient ID: Tara Hopkins, female   DOB: 08-Jan-1951, 62 y.o.   MRN: [id number] [facility] PROGRESS NOTE  Katelynd Blauvelt QMV:[id number] DOB: [birth year] DOA: 2013-06-02 PCP: Arlan Organ., MD  Brief narrative:  Pt is 62 y.o. female who is diabetic and has had a foot ulcer for the past month. She was seen at a wound care center about a week ago and started on local wound care and what sounds like doxycycline. Her wound has not improved with that regimen. She slipped and fell one night prior to admission, stubbing her foot. This was associated with malodorous, yellow discharge from the wound, purulent, no blood noted. Pt also reported subjective fevers, chills, no similar events in the past.   In the emergency room, x-ray shows no osteomyelitis. White blood cell count is 22,000. Creatinine is 2.   Principal Problem:  Diabetic foot infection with ulcer and abcess, right  - MRI of the right foot ordered and still pending  - will continue broad spectrum ABX Vancomycin and Zozyn day #3, appreciate ortho following - WBC trending down, once MRI available with see with ortho plan on surgical intervention - ensure diabetes control  Active Problems:  DM type 2 with diabetic peripheral neuropathy  - A1C > 14, continue home regimen with insulin  - continue SSI, moderate coverage  - diabetic educator consultation appreciated  Benign hypertension  - on hypotensive side but pt overall hemodynamically stable - will monitor closely on telemetry bed   Depression  - stable and at baseline, emotional support provided and pt appreciated our concern  Hypokalemia  - within normal limits this AM - Mg is within normal limits  Hyponatremia  - secondary to pre renal etiology, sodium trending towards normal - repeat BMP in AM  Anemia of chronic disease  - drop in Hg since admission, plan on transfusing two units of PRBC today  - repeat CBC in AM  Acute renal failure  - likely pre renal etiology  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  Antibiotics:  Vancomycin Jun 03, 2023 -->  Zosyn 06/03/2023 -->  Code Status: Full  Family Communication: Pt at bedside  Disposition Plan: Home when medically stable   HPI/Subjective: No events overnight.   Objective: Filed Vitals:   05/22/13 0440 05/22/13 1506 05/22/13 2150 05/23/13 0614  BP: 91/53 110/62 101/57 95/60  Pulse: 85 82 78 71  Temp: 98.3 F (36.8 C) 98 F (36.7 C) 97.5 F (36.4 C) 97.6 F (36.4 C)  TempSrc: Oral Oral Oral Oral  Resp: 18 18 18 18   Height:      Weight:      SpO2: 96% 96% 100% 100%    Intake/Output Summary (Last 24 hours) at 05/23/13 0929 Last data filed at 05/22/13 2155  Gross per 24 hour  Intake   1584 ml  Output      0 ml  Net   1584 ml    Exam:   General:  Pt is alert, follows commands appropriately, not in acute distress  Cardiovascular: Regular rate and rhythm, S1/S2, no murmurs, no rubs, no gallops  Respiratory: Clear to auscultation bilaterally, no wheezing, no crackles, no rhonchi  Abdomen: Soft, non tender, non distended, bowel sounds  present, no guarding  Neuro: Grossly nonfocal  Data Reviewed: Basic Metabolic Panel:  Recent Labs Lab 05/20/13 2123 05/21/13 0433 05/21/13 1455 05/22/13 0433 05/23/13 0405  NA 129* 128* 125* 129* 133*  K 3.2* 2.5* 3.5 4.6 4.1  CL 93* 90* 91* 94* 100  CO2  --  27 24 23 24   GLUCOSE 192* 249* 183* 99 50*  BUN 24* 27* 23 20 11   CREATININE 2.00* 1.69* 1.44* 1.42* 1.14*  CALCIUM  --  9.3 9.1 8.9 9.1  MG  --  2.0  --   --   --    CBC:  Recent Labs Lab 05/20/13 2115 05/20/13 2123 05/21/13 0433 05/22/13 0433 05/23/13 0405  WBC 22.2*  --  15.6* 14.2* 11.9*   NEUTROABS 18.4*  --  12.6*  --   --   HGB 10.1* 10.9* 8.9* 7.9* 7.5*  HCT 29.7* 32.0* 26.5* 23.2* 22.4*  MCV 84.4  --  83.3 84.7 85.5  PLT 596*  --  536* 524* 551*   CBG:  Recent Labs Lab 05/22/13 1127 05/22/13 1702 05/22/13 2148 05/23/13 0751 05/23/13 0817  GLUCAP 130* 74 60* 47* 126*    Recent Results (from the past 240 hour(s))  CULTURE, ROUTINE-ABSCESS     Status: None   Collection Time    05/21/13  1:36 AM      Result Value Range Status   Specimen Description WOUND FOOT   Final   Special Requests NONE   Final   Gram Stain     Final   Value: NO WBC SEEN     NO SQUAMOUS EPITHELIAL CELLS SEEN     NO ORGANISMS SEEN   Culture Culture reincubated for better growth   Final   Report Status PENDING   Incomplete  SURGICAL PCR SCREEN     Status: None   Collection Time    05/23/13  7:00 AM      Result Value Range Status   MRSA, PCR NEGATIVE  NEGATIVE Final   Staphylococcus aureus NEGATIVE  NEGATIVE Final   Comment:            The Xpert SA Assay (FDA     approved for NASAL specimens     in patients over 58 years of age),     is one component of     a comprehensive surveillance     program.  Test performance has     been validated by [employer] for patients greater     than or equal to 84 year old.     It is not intended     to diagnose infection nor to     guide or monitor treatment.     Scheduled Meds: . aspirin EC  81 mg Oral Daily  . atorvastatin  20 mg Oral q1800  . cholecalciferol  400 Units Oral Daily  . enoxaparin (LOVENOX) injection  40 mg Subcutaneous Q24H  . ferrous gluconate  325 mg Oral Q breakfast  . gabapentin  600 mg Oral QHS  . glimepiride  4 mg Oral QAC breakfast  . insulin aspart  0-15 Units Subcutaneous TID WC  . insulin glargine  60 Units Subcutaneous QHS  . lamoTRIgine  25 mg Oral Daily  . latanoprost  1 drop Both Eyes QHS  . linagliptin  5 mg Oral Daily  . lurasidone  60 mg Oral Daily  . nicotine  21 mg Transdermal Daily  .  piperacillin-tazobactam (ZOSYN)  IV  3.375  g Intravenous Q8H  . sertraline  50 mg Oral Daily  . thiamine  100 mg Oral Daily  . vancomycin  1,000 mg Intravenous Q24H  . vitamin C  500 mg Oral Daily   Continuous Infusions:    Debbora Presto, MD  [facility] Pager [phone]  If 7PM-7AM, please contact night-coverage [email or web address] Password [email or web address] 05/23/2013, 9:29 AM   LOS: 3 days

## 2013-05-23 NOTE — Anesthesia Preprocedure Evaluation (Signed)
Anesthesia Evaluation  Patient identified by MRN, date of birth, ID band Patient awake    Reviewed: Allergy & Precautions, H&P , NPO status , Patient's Chart, lab work & pertinent test results  Airway Mallampati: II TM Distance: >3 FB Neck ROM: Full    Dental no notable dental hx.    Pulmonary neg pulmonary ROS,  breath sounds clear to auscultation  Pulmonary exam normal       Cardiovascular hypertension, Pt. on medications negative cardio ROS  Rhythm:Regular Rate:Normal     Neuro/Psych PSYCHIATRIC DISORDERS Depression  Neuromuscular disease    GI/Hepatic negative GI ROS, Neg liver ROS,   Endo/Other  diabetes, Type 2, Oral Hypoglycemic Agents and Insulin Dependent  Renal/GU Renal diseasenegative Renal ROS  negative genitourinary   Musculoskeletal negative musculoskeletal ROS (+)   Abdominal   Peds negative pediatric ROS (+)  Hematology negative hematology ROS (+)   Anesthesia Other Findings   Reproductive/Obstetrics negative OB ROS                           Anesthesia Physical Anesthesia Plan  ASA: III and emergent  Anesthesia Plan: MAC   Post-op Pain Management:    Induction: Intravenous  Airway Management Planned:   Additional Equipment:   Intra-op Plan:   Post-operative Plan: Extubation in OR  Informed Consent: I have reviewed the patients History and Physical, chart, labs and discussed the procedure including the risks, benefits and alternatives for the proposed anesthesia with the patient or authorized representative who has indicated his/her understanding and acceptance.   Dental advisory given  Plan Discussed with: CRNA  Anesthesia Plan Comments: (Ankle block discussed with patient. She consents with GA backup.)        Anesthesia Quick Evaluation

## 2013-05-23 NOTE — Progress Notes (Signed)
Hypoglycemic Event  CBG: 58  Treatment: D50 IV 25 mL  Symptoms: None  Follow-up CBG: Time:1206 CBG Result:102  Possible Reasons for Event: Inadequate meal intake  Comments/MD notified:    Armanda Heritage  Remember to initiate Hypoglycemia Order Set & complete

## 2013-05-23 NOTE — Progress Notes (Signed)
Hypoglycemic Event  CBG: cbg 47  Treatment: D50 IV 25 mL  Symptoms: None  Follow-up CBG: [id number]  CBG Result:126  Possible Reasons for Event: Inadequate meal intake  Comments/MD notified:    Armanda Heritage  Remember to initiate Hypoglycemia Order Set & complete

## 2013-05-24 ENCOUNTER — Encounter (HOSPITAL_COMMUNITY): Payer: Self-pay | Admitting: Orthopedic Surgery

## 2013-05-24 DIAGNOSIS — F329 Major depressive disorder, single episode, unspecified: Secondary | ICD-10-CM

## 2013-05-24 LAB — TYPE AND SCREEN
ABO/RH(D): O POS
Antibody Screen: NEGATIVE
Unit division: 0

## 2013-05-24 LAB — BASIC METABOLIC PANEL
Chloride: 101 mEq/L (ref 96–112)
GFR calc Af Amer: 65 mL/min — ABNORMAL LOW (ref >90)
Potassium: 4.4 mEq/L (ref 3.5–5.1)

## 2013-05-24 LAB — CBC
Platelets: 504 10*3/uL — ABNORMAL HIGH (ref 150–400)
RDW: 14.4 % (ref 11.5–15.5)
WBC: 11.9 10*3/uL — ABNORMAL HIGH (ref 4.0–10.5)

## 2013-05-24 LAB — GLUCOSE, CAPILLARY
Glucose-Capillary: 87 mg/dL (ref 70–99)
Glucose-Capillary: 87 mg/dL (ref 70–99)
Glucose-Capillary: 52 mg/dL — ABNORMAL LOW (ref 70–99)

## 2013-05-24 LAB — VANCOMYCIN, TROUGH: Vancomycin Tr: 11.3 ug/mL (ref 10.0–20.0)

## 2013-05-24 MED ORDER — PHENOL 1.4 % MT LIQD
1.0000 | OROMUCOSAL | Status: DC | PRN
  Filled 2013-05-24: qty 177

## 2013-05-24 MED ORDER — VANCOMYCIN HCL IN DEXTROSE 750-5 MG/150ML-% IV SOLN
750.0000 mg | Freq: Two times a day (BID) | INTRAVENOUS | Status: DC
  Administered 2013-05-25 – 2013-05-26 (×3): 750 mg via INTRAVENOUS
  Filled 2013-05-24 (×4): qty 150

## 2013-05-24 NOTE — Progress Notes (Signed)
   Subjective: 1 Day Post-Op Procedure(s) (LRB): IRRIGATION AND DEBRIDEMENT RIGHT GREAT TOE (Right)   Seen by Dr. Charlann Boxer. Patient reports pain as mild, pain controlled. No events throughout the night. Toes are a little dark still, but wound vac in good position and pulling well.  Objective:   VITALS:   Filed Vitals:   05/24/13 0500  BP: 114/67  Pulse: 80  Temp: 98.1 F (36.7 C)  Resp: 20    Incision: scant drainage Compartment soft  LABS  Recent Labs  05/22/13 0433 05/23/13 0405 05/24/13 0435  HGB 7.9* 7.5* 10.0*  HCT 23.2* 22.4* 29.8*  WBC 14.2* 11.9* 11.9*  PLT 524* 551* 504*     Recent Labs  05/22/13 0433 05/23/13 0405 05/24/13 0435  NA 129* 133* 134*  K 4.6 4.1 4.4  BUN 20 11 9   CREATININE 1.42* 1.14* 1.06  GLUCOSE 99 50* 61*     Assessment/Plan: 1 Day Post-Op Procedure(s) (LRB): IRRIGATION AND DEBRIDEMENT RIGHT GREAT TOE (Right)  Up with therapy, WBAT with post-op shoe in place Continue antibiotics    Anastasio Auerbach. Becca Bayne   PAC  05/24/2013, 9:58 AM

## 2013-05-24 NOTE — Progress Notes (Signed)
Hypoglycemic Event  CBG: 55  Treatment: 15 GM carbohydrate snack  Symptoms: None  Follow-up CBG: Time:1220 CBG Result:73  Possible Reasons for Event: unknown  Comments/MD notified:    Armanda Heritage  Remember to initiate Hypoglycemia Order Set & complete

## 2013-05-24 NOTE — Progress Notes (Signed)
Pharmacy: Brief Note  Vancomycin trough tonight = 11.3 on vancomycin 1 gram IV Q24h (Trough drawn 7/16 at 2040 - Does not appear in [facility] because it was obtained and resulted during 30 minute downtime tonight).    Give vancomycin 1 gram x 1 now, then start 750 mg IV q12h  Monitor renal function  Repeat VT at C[facility]  Ezrah Panning, Loma Messing PharmD Pager #: [phone] 10:20 PM 05/24/2013

## 2013-05-24 NOTE — Care Management Note (Signed)
Cm spoke with patient at bedside with RN present concerning discharge planning. Pt recommends HHPT upon discharge. Pt offered choice for [facility]. Pt states will discuss d/c plan with daughter who will assist with discharge plan. Will continue to follow.   Roxy Manns Luvada Salamone,RN,BSN [phone]

## 2013-05-24 NOTE — Progress Notes (Signed)
ANTIBIOTIC CONSULT NOTE - Follow Up  Pharmacy Consult for Vancomycin, Zosyn Indication:  Diabetic foot ulcer  No Known Allergies  Patient Measurements: Height: 5' 5.5" (166.4 cm) Weight: 156 lb (70.761 kg) IBW/kg (Calculated) : 58.15  Vital Signs: Temp: 98.1 F (36.7 C) (07/16 0500) Temp src: Oral (07/16 0500) BP: 114/67 mmHg (07/16 0500) Pulse Rate: 80 (07/16 0500) Intake/Output from previous day: 07/15 0701 - 07/16 0700 In: 1720 [P.O.:720; I.V.:950; IV Piggyback:50] Out: 700 [Urine:650; Blood:50] Intake/Output from this shift:    Labs:  Recent Labs  05/22/13 0433 05/23/13 0405 05/24/13 0435  WBC 14.2* 11.9* 11.9*  HGB 7.9* 7.5* 10.0*  PLT 524* 551* 504*  CREATININE 1.42* 1.14* 1.06   Estimated Creatinine Clearance: 56.3 ml/min (by C-G formula based on Cr of 1.06). No results found for this basename: VANCOTROUGH, Leodis Binet, VANCORANDOM, GENTTROUGH, GENTPEAK, GENTRANDOM, TOBRATROUGH, TOBRAPEAK, TOBRARND, AMIKACINPEAK, AMIKACINTROU, AMIKACIN,  in the last 72 hours   Microbiology: Recent Results (from the past 720 hour(s))  CULTURE, ROUTINE-ABSCESS     Status: None   Collection Time    05/21/13  1:36 AM      Result Value Range Status   Specimen Description WOUND FOOT   Final   Special Requests NONE   Final   Gram Stain     Final   Value: NO WBC SEEN     NO SQUAMOUS EPITHELIAL CELLS SEEN     NO ORGANISMS SEEN   Culture FEW GRAM NEGATIVE RODS   Final   Report Status PENDING   Incomplete  SURGICAL PCR SCREEN     Status: None   Collection Time    05/23/13  7:00 AM      Result Value Range Status   MRSA, PCR NEGATIVE  NEGATIVE Final   Staphylococcus aureus NEGATIVE  NEGATIVE Final   Comment:            The Xpert SA Assay (FDA     approved for NASAL specimens     in patients over 21 years of age),     is one component of     a comprehensive surveillance     program.  Test performance has     been validated by [employer] for patients greater     than  or equal to 70 year old.     It is not intended     to diagnose infection nor to     guide or monitor treatment.  CULTURE, ROUTINE-ABSCESS     Status: None   Collection Time    05/23/13  3:07 PM      Result Value Range Status   Specimen Description FOOT RIGHT DIABETIC FOOT ULCER RIGHT GREAT TOE   Final   Special Requests ZOSYN,VANCOMYCIN   Final   Gram Stain     Final   Value: FEW WBC PRESENT, PREDOMINANTLY PMN     NO SQUAMOUS EPITHELIAL CELLS SEEN     RARE GRAM POSITIVE COCCI     IN PAIRS   Culture NO GROWTH   Final   Report Status PENDING   Incomplete  ANAEROBIC CULTURE     Status: None   Collection Time    05/23/13  3:07 PM      Result Value Range Status   Specimen Description FOOT RIGHT DIABETIC FOOT ULCER RIGHT GREAT TOE   Final   Special Requests ZOSYN,VANCOMYCIN   Final   Gram Stain     Final   Value: FEW WBC PRESENT,  PREDOMINANTLY PMN     NO SQUAMOUS EPITHELIAL CELLS SEEN     RARE GRAM POSITIVE COCCI     IN PAIRS   Culture     Final   Value: NO ANAEROBES ISOLATED; CULTURE IN PROGRESS FOR 5 DAYS   Report Status PENDING   Incomplete    Medical History: Past Medical History  Diagnosis Date  . Diabetes mellitus without complication   . Hypertension   . Hypercholesteremia   . Peripheral neuropathy   . Depression      Assessment: 62 yo diabetic female presented 7/12 with concerns of wound infection on R foot associated with pain, swelling, redness, slight drainage, and foul odor s/p fall last night. Patient has been seen at the wound center, completed a course of abx (sounds like doxycycline per MD notes).   Day # 5 Vancomycin 1g IV q24h and Zosyn 3.375g VI q8h, each dose over 4 hours  Scr down, 1.06 < 2, CrCl 56 ml/min  Wound cx 7/14 = GPC, speciation and susceptibilities pending  Tm 99 < WBC 11.9 < 22.2  POD 1 I and D of R big toe and 2nd toe and decompression of abscess  Goal of Therapy:  Vancomycin trough level 15-20 mcg/ml Appropriate renal adjustments  of antibiotics  Plan:  Continue current doses Check Vancomycin trough tonight Adjust dose for goal trough 15-20 given abscess Duration of therapy and narrow abx tx per MD  Gwen Her PharmD  [phone] 05/24/2013 11:08 AM

## 2013-05-24 NOTE — Op Note (Signed)
Tara Hopkins, Tara Hopkins            ACCOUNT NO.:  [id number]  MEDICAL RECORD NO.:  [id number]  LOCATION:  1316                         FACILITY:  [facility]  PHYSICIAN:  Madlyn Frankel. Charlann Boxer, M.D.  DATE OF BIRTH:  Mar 09, 1951  DATE OF PROCEDURE:  05/23/2013 DATE OF DISCHARGE:                              OPERATIVE REPORT   PREOPERATIVE DIAGNOSIS:  Right foot diabetic related abscess involving the right great toe and second toe.  POSTOPERATIVE DIAGNOSIS:  Right foot diabetic related abscess involving the right great toe and second toe.  PROCEDURE: 1. Sharp excisional debridement of the right great toe and second toe     with a scalpel in total probably 7 cm worth of incision involving     the skin, subcutaneous tissue, with decompression of abscess. 2. Non-excisional debridement of right great toe and second toe     abscess using 3 L of normal saline solution pulse lavaged. 3. Application of a small wound VAC on the medial aspect of the right     great toe.  SURGEON:  Madlyn Frankel. Charlann Boxer, M.D.  ASSISTANT:  Lanney Gins, PA-C.  ANESTHESIA:  General.  SPECIMENS:  Cultures were taken from the second toe abscess dorsally upon evacuation.  SPECIMENS:  Sent to pathology.  DRAINS:  None.  COMPLICATIONS:  None.  TOURNIQUET:  No proper tourniquet was utilized, only use an Esmarch for approximately 7 minutes of the procedure.  INDICATION FOR PROCEDURE:  Tara Hopkins is a 62 year old female with history of diabetes.  She had presented to the emergency room on May 20, 2013, with increasing pain, swelling, and foul-smelling odor from the right medial toe abscess.  She was admitted and placed on vancomycin and Zosyn.  Orthopedics was consulted, based on the progressive worsening, despite conservative attempts of wound management and oral antibiotics, but she was having difficult time paying for financially. An MRI had been ordered revealing abscess, soft tissue swelling.  No evidence of  obvious osteomyelitis at this point.  Plain films confirmed the same findings.  Based on this presentation and the progress of worsening wound, felt it was in her best interest to proceed with at least an excisional debridement, as opposed to amputation.  She was hoping to not have an amputation, though there is no guarantee if we are going to salvage her foot at this point.  The risks of potential recurrence persisted and infection, potential need for amputation of at least the great toe or second toe was mentioned.  Consent was obtained for benefit of infection while preserving the foot.  PROCEDURE IN DETAIL:  The patient was brought to operative theater. Once adequate anesthesia, preoperative antibiotics, and she had been on vancomycin and Zosyn, thus subtherapeutic.  I did re-dose her Zosyn a few hours earlier, as it was not due until 6:00 p.m.  The procedure was done around 3 p.m.  She was positioned supine.  Her right foot was then prepped and draped with Betadine scrub and paint from the toes to the upper leg.  Time-out was performed identifying the patient, planned procedure, and extremity.  I did use an Esmarch and exsanguinated the lower extremity and used this as a short-term tourniquet.  I made  a midline incision along the medial aspect of her forefoot extending around the area of the abscess and the callus area in the medial great toe.  I excised the skin and decompressed any abscess and cellulitic tissue in this area including the dorsal and plantar aspects.  I made a separate incision, I was unable to adequately dissect underneath the skin to the second toe and made it dorsally based incision just vertically to decompress the abscess, identified by MRI. At this point, I took cultures based on the abscess, penetration to try and specify antibiotic use for long term.  Following the sharp debridement excisionally of the skin and subcutaneous tissue, I then did a  nonexcisional debridement with 3 L of normal saline solution, pulse lavaged over the medial aspect of the foot and dorsal aspect of the second toe.  Following this debridement, I selected to reapproximate the distal and proximal ends of the dorsal incision of the second toe and impacted this with iodoform gauze.  I then on the medial aspect of the great toe reapproximated some of the proximal incision and 1 suture distally and then shows a small wound VAC which I cut down to fit and size, and then placed this medially.  The wound was clean and dried.  The wound VAC was applied medially and isolated.  The dorsal aspect of the second toe was dressed with Xeroform, I then placed a gauze, sponges between toes, and a bulky gauze dressing over her foot and wrapped it sterilely.  Following the conclusion of the procedure, the VAC sponge was set up to continuous suction 75 mmHg pressure with an excellent seal.  She was then brought to the recovery room, extubated in stable condition tolerating the procedure well.     Madlyn Frankel Charlann Boxer, M.D.     MDO/MEDQ  D:  05/23/2013  T:  05/24/2013  Job:  [id number]

## 2013-05-24 NOTE — Evaluation (Signed)
Physical Therapy Evaluation Patient Details Name: Tara Hopkins MRN: [id number] DOB: 12/24/1950 Today's Date: 05/24/2013 Time: 2130-8657 PT Time Calculation (min): 23 min  PT Assessment / Plan / Recommendation History of Present Illness  62 yo female who underwent I and D and placement of wound VAC in OR on 7/15 of diabetic wound on R great and first toe Pt had been treating an ulcer with local wound care for about a month and then stubbed her toe at home just prior to adamissionon 7/12  Clinical Impression  Pt moves somewhat impulsively and needs continued PT for safety, general strengthening and gait training with RW with attention to [facility] and strategies to decrease falls.    PT Assessment  Patient needs continued PT services    Follow Up Recommendations  Home health PT    Does the patient have the potential to tolerate intense rehabilitation      Barriers to Discharge        Equipment Recommendations  Rolling walker with 5" wheels    Recommendations for Other Services     Frequency Min 5X/week    Precautions / Restrictions Precautions Precautions: Fall Precaution Comments: VAC on right forefoot, ortho shoe  Restrictions Weight Bearing Restrictions: Yes RLE Weight Bearing: Weight bearing as tolerated   Pertinent Vitals/Pain Pt c/o pain in right foot with weight bearing .  Pt received pain med and postitioned with leg in elevation      Mobility  Bed Mobility Bed Mobility: Supine to Sit;Sit to Supine Supine to Sit: 5: Supervision Sit to Supine: 5: Supervision Details for Bed Mobility Assistance: pt tends to move quickly and needs to be asked to wait for safety untill all tubing in secured Transfers Transfers: Sit to Stand;Stand to Sit Sit to Stand: 4: Min assist;Without upper extremity assist;From bed;From chair/3-in-1 Stand to Sit: 4: Min assist Details for Transfer Assistance: pt needs assist to lift hips off bed Ambulation/Gait Ambulation/Gait Assistance: 4:  Min assist Ambulation Distance (Feet): 50 Feet Assistive device: Rolling walker Ambulation/Gait Assistance Details: pt needs frequent cues for sequence and safety.  Pt need cues to keep RW on the ground as she tends to pick it up and then get off balance.  pt needs assist to control VAC tubing to keep it from being a tripping hazard Gait Pattern: Step-through pattern;Decreased step length - right;Decreased step length - left Gait velocity: pt tends to walk impulsivel. Needs cues to slow down for safety General Gait Details: Pt is unsteady with gait at times.  Multimodal cues are needed in multiple areas for gait training safety Stairs: No Wheelchair Mobility Wheelchair Mobility: No    Exercises Other Exercises Other Exercises: Pt instructed to keep foot elevated , but also to have intermittent bouts of activity to improve strength and endurance   PT Diagnosis: Difficulty walking;Generalized weakness;Acute pain  PT Problem List: Decreased activity tolerance;Decreased balance;Decreased mobility;Decreased knowledge of use of DME;Decreased safety awareness PT Treatment Interventions: DME instruction;Gait training;Functional mobility training;Therapeutic activities;Therapeutic exercise;Balance training;Patient/family education     PT Goals(Current goals can be found in the care plan section) Acute Rehab PT Goals Patient Stated Goal: to go home with her daughter and family PT Goal Formulation: With patient Time For Goal Achievement: 06/07/13 Potential to Achieve Goals: Good  Visit Information  Last PT Received On: 05/24/13 History of Present Illness: 62 yo female who underwent I and D and placement of wound VAC in OR on 7/15 of diabetic wound on R great and first toe Pt had been  treating an ulcer with local wound care for about a month and then stubbed her toe at home just prior to adamissionon 7/12       Prior Functioning  Home Living Family/patient expects to be discharged to:: Private  residence Living Arrangements: Children Available Help at Discharge: Family Type of Home: House Home Access: Level entry Home Layout: One level Home Equipment: None Prior Function Level of Independence: Independent Communication Communication: No difficulties    Cognition  Cognition Arousal/Alertness: Awake/alert Behavior During Therapy: WFL for tasks assessed/performed Overall Cognitive Status: Within Functional Limits for tasks assessed    Extremity/Trunk Assessment Lower Extremity Assessment Lower Extremity Assessment: RLE deficits/detail RLE Deficits / Details: pt with bulky dressing on foot with VAC in place  Hip and knee ROM and strength are [facility] Cervical / Trunk Assessment Cervical / Trunk Assessment: Normal   Balance Balance Balance Assessed: Yes Static Sitting Balance Static Sitting - Balance Support: No upper extremity supported;Feet supported Static Sitting - Level of Assistance: 7: Independent Static Standing Balance Static Standing - Balance Support: Bilateral upper extremity supported Static Standing - Level of Assistance: 4: Min assist Static Standing - Comment/# of Minutes: pt unsteady due to dressings, VAC, shoe on R LE  End of Session PT - End of Session Activity Tolerance: Patient limited by pain Patient left: in chair;with nursing/sitter in room Nurse Communication: Mobility status  GP   Bayard Hugger. [place], [place] [phone] 05/24/2013, 9:19 AM

## 2013-05-24 NOTE — Progress Notes (Signed)
Patient ID: Tara Hopkins, female   DOB: 07-Aug-1951, 62 y.o.   MRN: [id number] [facility] PROGRESS NOTE  Tara Hopkins YQM:[id number] DOB: July 06, 1951 DOA: June 06, 2013 PCP: Arlan Organ., MD  Brief narrative:  Pt is 62 y.o. female who is diabetic and has had a foot ulcer for the past month. She was seen at a wound care center about a week ago and started on local wound care and what sounds like doxycycline. Her wound has not improved with that regimen. She slipped and fell one night prior to admission, stubbing her foot. This was associated with malodorous, yellow discharge from the wound, purulent, no blood noted. Pt also reported subjective fevers, chills, no similar events in the past.   In the emergency room, x-ray shows no osteomyelitis. White blood cell count is 22,000. Creatinine is 2.   Principal Problem:  Diabetic foot infection with ulcer and abcess, right  - MRI of the right foot shows no osteomyelitis - will continue broad spectrum ABX Vancomycin and Zozyn day # 4 appreciate ortho following - WBC trending down  Active Problems:  DM type 2 with diabetic peripheral neuropathy  - A1C > 14, continue home regimen with insulin  - continue SSI, moderate coverage  - diabetic educator consultation appreciated   Benign hypertension  - Stable  Depression  - stable and at baseline, emotional support provided and pt appreciated our concern   Hypokalemia  - within normal limits this AM - Mg is within normal limits   Hyponatremia  - Resolved. - secondary to pre renal etiology, sodium trending towards normal   Anemia of chronic disease  - drop in Hg since admission, plan on transfusing two units of PRBC today  - repeat CBC in AM   Acute renal failure  - likely pre renal etiology imposed on chronic renal failure stage II  - not clear what the baseline creatinine is but pt aware of chronic kidney damage  - IVF provided and pt improving, creatinine is trending down   - repeat BMP in AM   Consultants:  Ortho Procedures/Studies:  Dg Foot Complete Right 06-06-13 No radiographic evidence of osteomyelitis.  Antibiotics:  Vancomycin Jun 07, 2023 -->  Zosyn Jun 07, 2023 -->  Code Status: Full  Family Communication: Pt at bedside  Disposition Plan: Home when medically stable   HPI/Subjective: Patient seen and examined, no new complaints.  Objective: Filed Vitals:   05/23/13 1700 05/23/13 1909 05/23/13 2008 05/24/13 0500  BP: 112/63 100/56 103/63 114/67  Pulse: 80 83 73 80  Temp: 97.7 F (36.5 C) 97.3 F (36.3 C) 97.8 F (36.6 C) 98.1 F (36.7 C)  TempSrc: Oral Oral Oral Oral  Resp: 18 16 18 20   Height:      Weight:      SpO2: 100% 98% 96% 100%    Intake/Output Summary (Last 24 hours) at 05/24/13 1400 Last data filed at 05/24/13 1004  Gross per 24 hour  Intake   1770 ml  Output    700 ml  Net   1070 ml    Exam:  Physical Exam: Head: Normocephalic, atraumatic.  Eyes: No signs of jaundice, EOMI Neck: supple,No deformities, masses, or tenderness noted. Lungs: Normal respiratory effort. B/L Clear to auscultation, no crackles or wheezes.  Heart: Regular RR. S1 and S2 normal  Abdomen: BS normoactive. Soft, Nondistended, non-tender.  Extremities: Right foot in dressing, wound vac in place   Data Reviewed: Basic Metabolic Panel:  Recent Labs Lab 2013/06/06 2123 05/21/13 0433 05/21/13 1455 05/22/13 0433  05/23/13 0405 05/24/13 0435  NA 129* 128* 125* 129* 133* 134*  K 3.2* 2.5* 3.5 4.6 4.1 4.4  CL 93* 90* 91* 94* 100 101  CO2  --  27 24 23 24 25   GLUCOSE 192* 249* 183* 99 50* 61*  BUN 24* 27* 23 20 11 9   CREATININE 2.00* 1.69* 1.44* 1.42* 1.14* 1.06  CALCIUM  --  9.3 9.1 8.9 9.1 9.1  MG  --  2.0  --   --   --   --    CBC:  Recent Labs Lab 05/20/13 2115 05/20/13 2123 05/21/13 0433 05/22/13 0433 05/23/13 0405 05/24/13 0435  WBC 22.2*  --  15.6* 14.2* 11.9* 11.9*  NEUTROABS 18.4*  --  12.6*  --   --   --   HGB 10.1* 10.9* 8.9*  7.9* 7.5* 10.0*  HCT 29.7* 32.0* 26.5* 23.2* 22.4* 29.8*  MCV 84.4  --  83.3 84.7 85.5 85.1  PLT 596*  --  536* 524* 551* 504*   CBG:  Recent Labs Lab 05/23/13 2204 05/24/13 0200 05/24/13 0649 05/24/13 1147 05/24/13 1219  GLUCAP 142* 97 87 55* 73    Recent Results (from the past 240 hour(s))  CULTURE, ROUTINE-ABSCESS     Status: None   Collection Time    05/21/13  1:36 AM      Result Value Range Status   Specimen Description WOUND FOOT   Final   Special Requests NONE   Final   Gram Stain     Final   Value: NO WBC SEEN     NO SQUAMOUS EPITHELIAL CELLS SEEN     NO ORGANISMS SEEN   Culture FEW GRAM NEGATIVE RODS   Final   Report Status PENDING   Incomplete  SURGICAL PCR SCREEN     Status: None   Collection Time    05/23/13  7:00 AM      Result Value Range Status   MRSA, PCR NEGATIVE  NEGATIVE Final   Staphylococcus aureus NEGATIVE  NEGATIVE Final   Comment:            The Xpert SA Assay (FDA     approved for NASAL specimens     in patients over 38 years of age),     is one component of     a comprehensive surveillance     program.  Test performance has     been validated by [employer] for patients greater     than or equal to 35 year old.     It is not intended     to diagnose infection nor to     guide or monitor treatment.  CULTURE, ROUTINE-ABSCESS     Status: None   Collection Time    05/23/13  3:07 PM      Result Value Range Status   Specimen Description FOOT RIGHT DIABETIC FOOT ULCER RIGHT GREAT TOE   Final   Special Requests ZOSYN,VANCOMYCIN   Final   Gram Stain     Final   Value: FEW WBC PRESENT, PREDOMINANTLY PMN     NO SQUAMOUS EPITHELIAL CELLS SEEN     RARE GRAM POSITIVE COCCI     IN PAIRS   Culture NO GROWTH   Final   Report Status PENDING   Incomplete  ANAEROBIC CULTURE     Status: None   Collection Time    05/23/13  3:07 PM      Result Value Range Status  Specimen Description FOOT RIGHT DIABETIC FOOT ULCER RIGHT GREAT TOE   Final    Special Requests ZOSYN,VANCOMYCIN   Final   Gram Stain     Final   Value: FEW WBC PRESENT, PREDOMINANTLY PMN     NO SQUAMOUS EPITHELIAL CELLS SEEN     RARE GRAM POSITIVE COCCI     IN PAIRS   Culture     Final   Value: NO ANAEROBES ISOLATED; CULTURE IN PROGRESS FOR 5 DAYS   Report Status PENDING   Incomplete     Scheduled Meds: . aspirin EC  325 mg Oral Daily  . atorvastatin  20 mg Oral q1800  . cholecalciferol  400 Units Oral Daily  . ferrous gluconate  325 mg Oral Q breakfast  . gabapentin  600 mg Oral QHS  . glimepiride  4 mg Oral QAC breakfast  . insulin aspart  0-15 Units Subcutaneous TID WC  . insulin glargine  60 Units Subcutaneous QHS  . lamoTRIgine  25 mg Oral Daily  . latanoprost  1 drop Both Eyes QHS  . linagliptin  5 mg Oral Daily  . lurasidone  60 mg Oral Daily  . nicotine  21 mg Transdermal Daily  . piperacillin-tazobactam (ZOSYN)  IV  3.375 g Intravenous Q8H  . sertraline  50 mg Oral Daily  . thiamine  100 mg Oral Daily  . vancomycin  1,000 mg Intravenous Q24H  . vitamin C  500 mg Oral Daily   Continuous Infusions:    Meredeth Ide, MD  [facility] Pager [phone]  If 7PM-7AM, please contact night-coverage [email or web address] Password [email or web address] 05/24/2013, 2:00 PM   LOS: 4 days

## 2013-05-25 LAB — CBC
MCH: 28.4 pg (ref 26.0–34.0)
MCV: 86.1 fL (ref 78.0–100.0)
Platelets: 592 10*3/uL — ABNORMAL HIGH (ref 150–400)
RBC: 3.66 MIL/uL — ABNORMAL LOW (ref 3.87–5.11)
RDW: 14.5 % (ref 11.5–15.5)

## 2013-05-25 LAB — COMPREHENSIVE METABOLIC PANEL
AST: 44 U/L — ABNORMAL HIGH (ref 0–37)
BUN: 9 mg/dL (ref 6–23)
CO2: 24 mEq/L (ref 19–32)
Calcium: 9.1 mg/dL (ref 8.4–10.5)
Creatinine, Ser: 1.11 mg/dL — ABNORMAL HIGH (ref 0.50–1.10)
GFR calc non Af Amer: 53 mL/min — ABNORMAL LOW (ref >90)

## 2013-05-25 LAB — GLUCOSE, CAPILLARY
Glucose-Capillary: 106 mg/dL — ABNORMAL HIGH (ref 70–99)
Glucose-Capillary: 149 mg/dL — ABNORMAL HIGH (ref 70–99)
Glucose-Capillary: 223 mg/dL — ABNORMAL HIGH (ref 70–99)

## 2013-05-25 LAB — CULTURE, ROUTINE-ABSCESS: Gram Stain: NONE SEEN

## 2013-05-25 NOTE — Progress Notes (Signed)
Patient ID: Tara Hopkins, female   DOB: 1951-05-30, 62 y.o.   MRN: [id number] [facility] PROGRESS NOTE  Tara Hopkins [id number] DOB: [birth year] DOA: 02-Jun-2013 PCP: Arlan Organ., MD  Brief narrative:  Pt is 62 y.o. female who is diabetic and has had a foot ulcer for the past month. She was seen at a wound care center about a week ago and started on local wound care and what sounds like doxycycline. Her wound has not improved with that regimen. She slipped and fell one night prior to admission, stubbing her foot. This was associated with malodorous, yellow discharge from the wound, purulent, no blood noted. Pt also reported subjective fevers, chills, no similar events in the past.   In the emergency room, x-ray shows no osteomyelitis. White blood cell count is 22,000. Creatinine is 2.   Principal Problem:  Diabetic foot infection with ulcer and abcess, right  - MRI of the right foot shows no osteomyelitis - will continue broad spectrum ABX Vancomycin and Zozyn day # 5 appreciate ortho following - WBC trending down. - wound vac in place.  Active Problems:  DM type 2 with diabetic peripheral neuropathy  - A1C > 14, continue home regimen with insulin  - continue SSI, moderate coverage  - diabetic educator consultation appreciated   Benign hypertension  - Stable  Depression  - stable and at baseline, emotional support provided and pt appreciated our concern   Hypokalemia  - within normal limits this AM - Mg is within normal limits   Hyponatremia  - Resolved. - secondary to pre renal etiology, sodium trending towards normal   Anemia of chronic disease  - drop in Hg since admission, plan on transfusing two units of PRBC today  - repeat CBC in AM   Acute renal failure  - likely pre renal etiology imposed on chronic renal failure stage II  - not clear what the baseline creatinine is but pt aware of chronic kidney damage  - IVF provided and pt improving,  creatinine is trending down  - repeat BMP in AM   Consultants:  Ortho Procedures/Studies:  Dg Foot Complete Right 06/02/13 No radiographic evidence of osteomyelitis.  Antibiotics:  Vancomycin 06/03/2023 -->  Zosyn 2023-06-03 -->  Code Status: Full  Family Communication: Pt at bedside  Disposition Plan: Home when medically stable   HPI/Subjective: Patient seen and examined, no new complaints.  Objective: Filed Vitals:   05/24/13 0500 05/24/13 1444 05/24/13 2137 05/25/13 0411  BP: 114/67 100/64 136/56 105/58  Pulse: 80 74 73 81  Temp: 98.1 F (36.7 C) 97.6 F (36.4 C) 98.1 F (36.7 C) 98.2 F (36.8 C)  TempSrc: Oral Oral Oral Oral  Resp: 20 20 18 16   Height:      Weight:      SpO2: 100% 100% 100% 98%    Intake/Output Summary (Last 24 hours) at 05/25/13 1409 Last data filed at 05/25/13 9562  Gross per 24 hour  Intake   1753 ml  Output     50 ml  Net   1703 ml    Exam:  Physical Exam: Head: Normocephalic, atraumatic.  Eyes: No signs of jaundice, EOMI Neck: supple,No deformities, masses, or tenderness noted. Lungs: Normal respiratory effort. B/L Clear to auscultation, no crackles or wheezes.  Heart: Regular RR. S1 and S2 normal  Abdomen: BS normoactive. Soft, Nondistended, non-tender.  Extremities: Right foot in dressing, wound vac in place   Data Reviewed: Basic Metabolic Panel:  Recent Labs Lab 06/02/13 2123  05/21/13 0433 05/21/13 1455 05/22/13 0433 05/23/13 0405 05/24/13 0435 05/25/13 0410  NA 129*  --  128* 125* 129* 133* 134* 132*  K 3.2*  --  2.5* 3.5 4.6 4.1 4.4 4.5  CL 93*  --  90* 91* 94* 100 101 97  CO2  --   < > 27 24 23 24 25 24   GLUCOSE 192*  --  249* 183* 99 50* 61* 148*  BUN 24*  --  27* 23 20 11 9 9   CREATININE 2.00*  --  1.69* 1.44* 1.42* 1.14* 1.06 1.11*  CALCIUM  --   < > 9.3 9.1 8.9 9.1 9.1 9.1  MG  --   --  2.0  --   --   --   --   --   < > = values in this interval not displayed. CBC:  Recent Labs Lab 05/20/13 2115   05/21/13 0433 05/22/13 0433 05/23/13 0405 05/24/13 0435 05/25/13 0410  WBC 22.2*  --  15.6* 14.2* 11.9* 11.9* 12.0*  NEUTROABS 18.4*  --  12.6*  --   --   --   --   HGB 10.1*  < > 8.9* 7.9* 7.5* 10.0* 10.4*  HCT 29.7*  < > 26.5* 23.2* 22.4* 29.8* 31.5*  MCV 84.4  --  83.3 84.7 85.5 85.1 86.1  PLT 596*  --  536* 524* 551* 504* 592*  < > = values in this interval not displayed. CBG:  Recent Labs Lab 05/24/13 2219 05/24/13 2358 05/25/13 0410 05/25/13 0724 05/25/13 1146  GLUCAP 80 149* 141* 106* 223*    Recent Results (from the past 240 hour(s))  CULTURE, ROUTINE-ABSCESS     Status: None   Collection Time    05/21/13  1:36 AM      Result Value Range Status   Specimen Description WOUND FOOT   Final   Special Requests NONE   Final   Gram Stain     Final   Value: NO WBC SEEN     NO SQUAMOUS EPITHELIAL CELLS SEEN     NO ORGANISMS SEEN   Culture FEW MORGANELLA MORGANII   Final   Report Status 05/25/2013 FINAL   Final   Organism ID, Bacteria MORGANELLA MORGANII   Final  SURGICAL PCR SCREEN     Status: None   Collection Time    05/23/13  7:00 AM      Result Value Range Status   MRSA, PCR NEGATIVE  NEGATIVE Final   Staphylococcus aureus NEGATIVE  NEGATIVE Final   Comment:            The Xpert SA Assay (FDA     approved for NASAL specimens     in patients over 12 years of age),     is one component of     a comprehensive surveillance     program.  Test performance has     been validated by [employer] for patients greater     than or equal to 67 year old.     It is not intended     to diagnose infection nor to     guide or monitor treatment.  CULTURE, ROUTINE-ABSCESS     Status: None   Collection Time    05/23/13  3:07 PM      Result Value Range Status   Specimen Description FOOT RIGHT DIABETIC FOOT ULCER RIGHT GREAT TOE   Final   Special Requests ZOSYN,VANCOMYCIN   Final  Gram Stain     Final   Value: FEW WBC PRESENT, PREDOMINANTLY PMN     NO SQUAMOUS  EPITHELIAL CELLS SEEN     RARE GRAM POSITIVE COCCI     IN PAIRS   Culture NO GROWTH   Final   Report Status PENDING   Incomplete  ANAEROBIC CULTURE     Status: None   Collection Time    05/23/13  3:07 PM      Result Value Range Status   Specimen Description FOOT RIGHT DIABETIC FOOT ULCER RIGHT GREAT TOE   Final   Special Requests ZOSYN,VANCOMYCIN   Final   Gram Stain     Final   Value: FEW WBC PRESENT, PREDOMINANTLY PMN     NO SQUAMOUS EPITHELIAL CELLS SEEN     RARE GRAM POSITIVE COCCI     IN PAIRS   Culture     Final   Value: NO ANAEROBES ISOLATED; CULTURE IN PROGRESS FOR 5 DAYS   Report Status PENDING   Incomplete     Scheduled Meds: . aspirin EC  325 mg Oral Daily  . atorvastatin  20 mg Oral q1800  . cholecalciferol  400 Units Oral Daily  . ferrous gluconate  325 mg Oral Q breakfast  . gabapentin  600 mg Oral QHS  . glimepiride  4 mg Oral QAC breakfast  . insulin aspart  0-15 Units Subcutaneous TID WC  . insulin glargine  60 Units Subcutaneous QHS  . lamoTRIgine  25 mg Oral Daily  . latanoprost  1 drop Both Eyes QHS  . linagliptin  5 mg Oral Daily  . lurasidone  60 mg Oral Daily  . nicotine  21 mg Transdermal Daily  . piperacillin-tazobactam (ZOSYN)  IV  3.375 g Intravenous Q8H  . sertraline  50 mg Oral Daily  . thiamine  100 mg Oral Daily  . vancomycin  750 mg Intravenous Q12H  . vitamin C  500 mg Oral Daily   Continuous Infusions:    Meredeth Ide, MD  [facility] Pager [phone]  If 7PM-7AM, please contact night-coverage [email or web address] Password [email or web address] 05/25/2013, 2:09 PM   LOS: 5 days

## 2013-05-25 NOTE — Progress Notes (Addendum)
Hypoglycemic Event   CBG: 52 (at 2134)  Treatment: 2 4 oz containers of orange juice  Symptoms: asymptomatic  Follow-up CBG: Time:2219 CBG Result:80  Possible Reasons for Event: unknown  Comments/MD notified:MD aware; will hold lantus tonight.    Tara Hopkins  Remember to initiate Hypoglycemia Order Set & complete

## 2013-05-25 NOTE — Progress Notes (Addendum)
Clinical Social Work Department CLINICAL SOCIAL WORK PLACEMENT NOTE 05/25/2013  Patient:  Tara Hopkins, Tara Hopkins  Account Number:  [id number] Admit date:  05/20/2013  Clinical Social Worker:  Jacelyn Grip  Date/time:  05/25/2013 11:20 AM  Clinical Social Work is seeking post-discharge placement for this patient at the following level of care:   SKILLED NURSING   (*CSW will update this form in Epic as items are completed)   05/25/2013  Patient/family provided with [facility] Department of Clinical Social Work's list of facilities offering this level of care within the geographic area requested by the patient (or if unable, by the patient's family).  05/25/2013  Patient/family informed of their freedom to choose among providers that offer the needed level of care, that participate in Medicare, Medicaid or managed care program needed by the patient, have an available bed and are willing to accept the patient.  05/25/2013  Patient/family informed of [facility]' ownership interest in [facility], as well as of the fact that they are under no obligation to receive care at this facility.  PASARR submitted to EDS on 05/25/2013 PASARR number received from EDS on 05/25/2013  FL2 transmitted to all facilities in geographic area requested by pt/family on  05/25/2013 FL2 transmitted to all facilities within larger geographic area on   Patient informed that his/her managed care company has contracts with or will negotiate with  certain facilities, including the following:     Patient/family informed of bed offers received:  05/25/2013 Patient chooses bed at [facility] Physician recommends and patient chooses bed at    Patient to be transferred to  on [facility] on 05/26/2013 Patient to be transferred to facility by pt daughter via private vehicle  The following physician request were entered in Epic:   Additional Comments:    Jacklynn Lewis, MSW,  LCSWA  Clinical Social Work [phone]

## 2013-05-25 NOTE — Progress Notes (Signed)
Inpatient Diabetes Program Recommendations  AACE/ADA: New Consensus Statement on Inpatient Glycemic Control (2013)  Target Ranges:  Prepandial:   less than 140 mg/dL      Peak postprandial:   less than 180 mg/dL (1-2 hours)      Critically ill patients:  140 - 180 mg/dL   Reason for Visit: Hypoglycemia  Results for Tara, Hopkins (MRN [id number]) as of 05/25/2013 10:20  Ref. Range 05/24/2013 11:47 05/24/2013 12:19 05/24/2013 17:24 05/24/2013 20:40 05/24/2013 21:34 05/24/2013 22:19 05/24/2013 23:58 05/25/2013 04:10 05/25/2013 07:24  Glucose-Capillary Latest Range: 70-99 mg/dL 55 (L) 73 87  52 (L) 80 149 (H) 141 (H) 106 (H)    Inpatient Diabetes Program Recommendations Insulin - Basal: Decrease Lantus to 30 units QHS d/t hypoglycemia Oral Agents: D/C Amaryl and tradjenta while inpatient. Restart at discharge.  Note: Will follow.  Thank you. Ailene Ards, RD, LDN, CDE Inpatient Diabetes Coordinator [phone]

## 2013-05-25 NOTE — Progress Notes (Signed)
Clinical Social Work Department BRIEF PSYCHOSOCIAL ASSESSMENT 05/25/2013  Patient:  Tara Hopkins, Tara Hopkins     Account Number:  [id number]     Admit date:  05/20/2013  Clinical Social Worker:  Jacelyn Grip  Date/Time:  05/25/2013 11:15 AM  Referred by:  Physician  Date Referred:  05/25/2013 Referred for  SNF Placement   Other Referral:   Interview type:  Patient Other interview type:    PSYCHOSOCIAL DATA Living Status:  FAMILY Admitted from facility:   Level of care:   Primary support name:  Theron Arista Frappier/spouse Primary support relationship to patient:  SPOUSE Degree of support available:   adequate    CURRENT CONCERNS Current Concerns  Post-Acute Placement   Other Concerns:    SOCIAL WORK ASSESSMENT / PLAN CSW received referral that pt interested in exploring rehab at SNF options for discharge as she does not feel that she will be able to manage care including IV abx at home at discharge.    CSW met with pt at bedside to discuss. Pt stated that she knows that she would be unable to adequately care for herself at home. CSW discussed process of SNF search. CSW explained that CSW unsure if pt insurance ([employer] PPO) would cover SNF placement, and explained the process to find out if insurance would cover is to complete SNF search and pt to choose bed and facility to initiate insurance authorization. Pt expressed understanding and wants to pursue placement and hopeful insurance will authorize. CSW encouraged pt to also have a plan for home in case insurance does not authorize SNF placement. Pt also reports that pt daughter assisted pt in applying for Medicaid and application was submitted yesterday.    CSW completed FL2 and initiated SNF search to [facility].  CSW to follow up with pt re: bed offers and have pt decide on facility in order for facility to initiate [employer] authorization.    CSW to continue to follow to assist with disposition planning.    Assessment/plan status:  Psychosocial Support/Ongoing Assessment of Needs Other assessment/ plan:   discharge planning   Information/referral to community resources:   [facility] list    PATIENT'S/FAMILY'S RESPONSE TO PLAN OF CARE: Pt alert and oriented x 4. Pt very hopeful that pt insurance will cover SNF for rehab as she does not feel that she would be able to manage her care at home. Pt expressed appreciation for CSW assistance.     Jacklynn Lewis, MSW, LCSWA  Clinical Social Work [phone]

## 2013-05-25 NOTE — Progress Notes (Signed)
CSW followed up with pt re: SNF bed offers.   Pt reports that pt daughter will arrive to hospital shortly and they will review SNF and notify this CSW when decision made.  CSW encourage pt to make decision soon as chosen facility will have to initiate insurance authorization to see if pt insurance with authorize SNF stay.  CSW provided pt with this CSW contact phone number to call with any questions or to notify if decision is made today.  CSW to continue to follow to assist with pt discharge planning needs.   Jacklynn Lewis, MSW, LCSWA  Clinical Social Work [phone]

## 2013-05-25 NOTE — Progress Notes (Signed)
Physical Therapy Treatment Patient Details Name: Tara Hopkins MRN: [id number] DOB: Jul 20, 1951 Today's Date: 05/25/2013 Time: 4098-1191 PT Time Calculation (min): 25 min  PT Assessment / Plan / Recommendation  PT Comments   Pt progressing but concerned about managing at home, she continues to be mildly impulsive and unsafe putting her at risk for falls; Pt prefers SNF at this time and agree she could benefit from short stay if it is an option; Pt great toe and second toe are cool to touch and purple at time of PT visit;  Grey socks to pt for use over ACE wrap when amb;  Follow Up Recommendations  SNF     Does the patient have the potential to tolerate intense rehabilitation     Barriers to Discharge        Equipment Recommendations  Rolling walker with 5" wheels    Recommendations for Other Services    Frequency Min 5X/week   Progress towards PT Goals Progress towards PT goals: Progressing toward goals  Plan Discharge plan needs to be updated    Precautions / Restrictions Precautions Precautions: Fall Precaution Comments: VAC on right forefoot, ortho shoe  Restrictions Weight Bearing Restrictions: Yes RLE Weight Bearing: Weight bearing as tolerated   Pertinent Vitals/Pain C/o some RLE pain but tolerable per pt    Mobility  Bed Mobility Bed Mobility: Supine to Sit Supine to Sit: 6: Modified independent (Device/Increase time) Details for Bed Mobility Assistance: pt tends to move quickly and needs to be asked to wait for safety untill all tubing in secured Transfers Transfers: Sit to Stand;Stand to Sit Sit to Stand: 4: Min guard;5: Supervision;From toilet;From bed;With upper extremity assist Stand to Sit: 4: Min guard;5: Supervision;To toilet;To chair/3-in-1;With upper extremity assist Details for Transfer Assistance: min/guard for safety, cues for hand placement and to allow time for all lines to be arranged Ambulation/Gait Ambulation/Gait Assistance: 4: Min  guard Ambulation Distance (Feet): 200 Feet (10 without RW) Assistive device: Rolling walker Ambulation/Gait Assistance Details: Pt requiring occasional assist and frequent cues for RW safety, sequence; requires assist with VAC tubing/prevent it from being a fall risk (which it is);  Gait Pattern: Step-through pattern;Decreased stride length General Gait Details: Pt is unsteady with gait at times.  Multimodal cues are needed in multiple areas for gait training safety; mildly impulsive    Exercises     PT Diagnosis:    PT Problem List:   PT Treatment Interventions:     PT Goals (current goals can now be found in the care plan section) Acute Rehab PT Goals Patient Stated Goal: pt states she wants to go to rehab Time For Goal Achievement: 06/07/13 Potential to Achieve Goals: Good  Visit Information  Last PT Received On: 05/25/13 Assistance Needed: +1 History of Present Illness: 62 yo female who underwent I and D and placement of wound VAC in OR on 7/15 of diabetic wound on R great and first toe Pt had been treating an ulcer with local wound care for about a month and then stubbed her toe at home just prior to adamissionon 7/12    Subjective Data  Subjective: ok, I want to go to bathroom  Patient Stated Goal: pt states she wants to go to rehab   Cognition  Cognition Arousal/Alertness: Awake/alert Behavior During Therapy: WFL for tasks assessed/performed Overall Cognitive Status: Within Functional Limits for tasks assessed    Balance  Static Sitting Balance Static Sitting - Balance Support: No upper extremity supported;Feet supported Static Sitting - Level of  Assistance: 7: Independent Static Standing Balance Static Standing - Balance Support: Bilateral upper extremity supported Static Standing - Level of Assistance: 5: Stand by assistance  End of Session PT - End of Session Activity Tolerance: Patient tolerated treatment well Patient left: in chair;with call bell/phone within  reach Nurse Communication: Mobility status   GP     [facility] 05/25/2013, 11:31 AM

## 2013-05-26 LAB — CULTURE, ROUTINE-ABSCESS

## 2013-05-26 LAB — GLUCOSE, CAPILLARY

## 2013-05-26 MED ORDER — OXYCODONE-ACETAMINOPHEN 5-325 MG PO TABS: 1.0000 | ORAL_TABLET | Freq: Four times a day (QID) | ORAL | Status: DC | PRN

## 2013-05-26 MED ORDER — SULFAMETHOXAZOLE-TMP DS 800-160 MG PO TABS: 1.0000 | ORAL_TABLET | Freq: Two times a day (BID) | ORAL | Status: AC

## 2013-05-26 NOTE — Discharge Summary (Signed)
Physician Discharge Summary  Tara Hopkins WGN:[id number] DOB: Nov 19, 1950 DOA: 05/20/2013  PCP: Arlan Organ., MD  Admit date: 05/20/2013 Discharge date: 05/26/2013  Time spent: 50 minutes  Recommendations for Outpatient Follow-up:  1. Follow up ortho in 2 weeks 2. Follow up PCP in 2 weeks  Discharge Diagnoses:  Principal Problem:   Diabetic foot infection with ulcer and abcess, right Active Problems:   DM type 2 with diabetic peripheral neuropathy   Benign hypertension   Depression   Hypokalemia   Hyponatremia   Anemia   Renal insufficiency   Discharge Condition: Stable  Diet recommendation: Diabetic diet  Filed Weights   05/20/13 2347 05/21/13 0025  Weight: 70.761 kg (156 lb) 70.761 kg (156 lb)    History of present illness:  62 y.o. female who is diabetic and has had a foot ulcer for the past month. She was seen at a wound care center about a week ago and started on local wound care and what sounds like doxycycline. Her wound has not improved, and now her foot is red. She slipped and fell last night, stubbing her foot. She has had subjective fevers and chills. She has felt weak. She has peripheral neuropathy. Today, her foot began draining malodorous purulent material. She reports her blood sugars have been running about 160-170. In the emergency room, x-ray shows no osteomyelitis. White blood cell count is 22,000. Creatinine is 2. Baseline creatinine is unknown.   Hospital Course:  Diabetic foot infection with ulcer and abcess, right  -Patient was seen by orthopedics and underwent irrigation and appointment of right great toe,  MRI of the right foot showed  no osteomyelitis  she was continued on broad spectrum ABX Vancomycin and Zozyn day # 6 Culture of the wound from 07/13/2014grew FEW MORGANELLA MORGANII which was sensitive to Bactrim. A wound culture on 05/23/2013 grew few group B strep. At this time patient will be discharged home on Bactrim DS 1 tablet by mouth  twice a day for 30 days. Wound care home health nurse has been set up for the wound care at home. Patient will not be discharged on wound VAC.   Active Problems:  DM type 2 with diabetic peripheral neuropathy  - A1C > 14, continue home regimen with Lantus, Amaryl, Onglyza. -  Benign hypertension  - Stable   - Will continue taking lisinopril, hctz.  Depression  - stable and at baseline, emotional support provided and pt appreciated our concern  - Continue sertraline   Hyponatremia  - Resolved.  - secondary to pre renal etiology  Anemia of chronic disease  - drop in Hg since admission, plan on transfusing two units of PRBC today  - repeat CBC in AM   Acute renal failure  - likely pre renal etiology imposed on chronic renal failure stage II  -Resolved     Procedures: IRRIGATION AND DEBRIDEMENT RIGHT GREAT TOE (Right   Consultations:   orthopedics   Discharge Exam: Filed Vitals:   05/25/13 0411 05/25/13 1400 05/25/13 2032 05/26/13 0435  BP: 105/58 110/61 132/56 125/66  Pulse: 81 68 71 77  Temp: 98.2 F (36.8 C) 97.8 F (36.6 C) 97.9 F (36.6 C) 98.1 F (36.7 C)  TempSrc: Oral Oral Oral Oral  Resp: 16 18 18 18   Height:      Weight:      SpO2: 98% 97% 99% 98%    General:  Appearing no acute distress Cardiovascular: S1-S2 regular Respiratory: Clear bilaterally, no wheezing Ext: trace edema of  right leg, right foot in dressing  Discharge Instructions  Discharge Orders   Future Orders Complete By Expires     Diet - low sodium heart healthy  As directed     Increase activity slowly  As directed         Medication List         aspirin EC 81 MG tablet  Take 81 mg by mouth daily.     Calcium + D3 600-200 MG-UNIT Tabs  Take 1 tablet by mouth daily.     cholecalciferol 400 UNITS Tabs  Commonly known as:  VITAMIN D  Take 400 Units by mouth daily.     COD LIVER OIL PO  Take 1 capsule by mouth daily.     collagenase ointment  Commonly known as:   SANTYL  Apply 1 application topically daily.     ferrous gluconate 325 MG tablet  Commonly known as:  FERGON  Take 325 mg by mouth daily with breakfast.     fish oil-omega-3 fatty acids 1000 MG capsule  Take 1 g by mouth daily.     gabapentin 300 MG capsule  Commonly known as:  NEURONTIN  Take 600 mg by mouth at bedtime.     glimepiride 4 MG tablet  Commonly known as:  AMARYL  Take 4 mg by mouth daily before breakfast.     hydrochlorothiazide 25 MG tablet  Commonly known as:  HYDRODIURIL  Take 25 mg by mouth daily.     insulin glargine 100 UNIT/ML injection  Commonly known as:  LANTUS  Inject 60 Units into the skin at bedtime.     lamoTRIgine 25 MG tablet  Commonly known as:  LAMICTAL  Take 25 mg by mouth daily.     latanoprost 0.005 % ophthalmic solution  Commonly known as:  XALATAN  Place 1 drop into both eyes at bedtime.     LATUDA 60 MG Tabs  Generic drug:  Lurasidone HCl  Take 60 mg by mouth daily.     lisinopril 5 MG tablet  Commonly known as:  PRINIVIL,ZESTRIL  Take 5 mg by mouth daily.     ONGLYZA 5 MG Tabs tablet  Generic drug:  saxagliptin HCl  Take 5 mg by mouth daily.     oxyCODONE-acetaminophen 5-325 MG per tablet  Commonly known as:  PERCOCET/ROXICET  Take 1-2 tablets by mouth every 6 (six) hours as needed.     rosuvastatin 10 MG tablet  Commonly known as:  CRESTOR  Take 10 mg by mouth daily.     sertraline 50 MG tablet  Commonly known as:  ZOLOFT  Take 50 mg by mouth daily.     sulfamethoxazole-trimethoprim 800-160 MG per tablet  Commonly known as:  BACTRIM DS  Take 1 tablet by mouth 2 (two) times daily.     thiamine 100 MG tablet  Take 100 mg by mouth daily.     vitamin C 500 MG tablet  Commonly known as:  ASCORBIC ACID  Take 500 mg by mouth daily.     vitamin E 100 UNIT capsule  Take 100 Units by mouth daily.       No Known Allergies     Follow-up Information   Follow up with Shelda Pal, MD. (Follow up in 10-14 days)     Contact information:   [address] [place] [address] [phone]        The results of significant diagnostics from this hospitalization (including imaging, microbiology, ancillary and  laboratory) are listed below for reference.    Significant Diagnostic Studies: Mr Foot Right Wo Contrast  05/23/2013   *RADIOLOGY REPORT*  Clinical Data: Aeration right foot.  MRI OF THE RIGHT FOREFOOT WITHOUT CONTRAST  Technique:  Multiplanar, multisequence MR imaging was performed. No intravenous contrast was administered.  Comparison: Plain films 05/20/2013.  Findings: There is marked subcutaneous edema over the dorsum of the foot.  The patient has a fluid collection centered over the second, third and fourth metatarsals.  The collection measures approximately 3.1 cm transverse by 0.9 cm cranial-caudal by 6.0 cm long.  The collection extends from posterolateral in an anterior and medial orientation towards the first and second toes.  There is some fluid in the sheath of the extensor tendon of the second toe worrisome for infectious tenosynovitis.  No bone marrow signal abnormality to suggest osteomyelitis is identified.  Small second MTP joint effusion is noted.  IMPRESSION: Findings consistent with cellulitis and abscess formation over the dorsum of the foot as described.  Fluid in the sheath of the extensor tendon of the second toe is worrisome for septic tenosynovitis.  There is no evidence of osteomyelitis.   Original Report Authenticated By: [name] Dexter, M.D.   Dg Foot Complete Right  05/20/2013   *RADIOLOGY REPORT*  Clinical Data: Nonhealing wound in the plantar aspect of the right foot at the MTP joint.  RIGHT FOOT COMPLETE - 3+ VIEW  Comparison: None.  Findings: Bandage material at the level of the first MTP and the distal first metatarsal.  No bone destruction, periosteal reaction or soft tissue gas.  Moderate-sized inferior and small posterior calcaneal spurs.  Arterial  calcification.  IMPRESSION: No radiographic evidence of osteomyelitis.   Original Report Authenticated By: Beckie Salts, M.D.    Microbiology: Recent Results (from the past 240 hour(s))  CULTURE, ROUTINE-ABSCESS     Status: None   Collection Time    05/21/13  1:36 AM      Result Value Range Status   Specimen Description WOUND FOOT   Final   Special Requests NONE   Final   Gram Stain     Final   Value: NO WBC SEEN     NO SQUAMOUS EPITHELIAL CELLS SEEN     NO ORGANISMS SEEN   Culture FEW MORGANELLA MORGANII   Final   Report Status 05/25/2013 FINAL   Final   Organism ID, Bacteria MORGANELLA MORGANII   Final  SURGICAL PCR SCREEN     Status: None   Collection Time    05/23/13  7:00 AM      Result Value Range Status   MRSA, PCR NEGATIVE  NEGATIVE Final   Staphylococcus aureus NEGATIVE  NEGATIVE Final   Comment:            The Xpert SA Assay (FDA     approved for NASAL specimens     in patients over 40 years of age),     is one component of     a comprehensive surveillance     program.  Test performance has     been validated by [employer] for patients greater     than or equal to 34 year old.     It is not intended     to diagnose infection nor to     guide or monitor treatment.  CULTURE, ROUTINE-ABSCESS     Status: None   Collection Time    05/23/13  3:07 PM  Result Value Range Status   Specimen Description FOOT RIGHT DIABETIC FOOT ULCER RIGHT GREAT TOE   Final   Special Requests ZOSYN,VANCOMYCIN   Final   Gram Stain     Final   Value: FEW WBC PRESENT, PREDOMINANTLY PMN     NO SQUAMOUS EPITHELIAL CELLS SEEN     RARE GRAM POSITIVE COCCI     IN PAIRS   Culture     Final   Value: FEW GROUP B STREP(S.AGALACTIAE)ISOLATED     Note: TESTING AGAINST S. AGALACTIAE NOT ROUTINELY PERFORMED DUE TO PREDICTABILITY OF AMP/PEN/VAN SUSCEPTIBILITY.   Report Status 05/26/2013 FINAL   Final  ANAEROBIC CULTURE     Status: None   Collection Time    05/23/13  3:07 PM      Result  Value Range Status   Specimen Description FOOT RIGHT DIABETIC FOOT ULCER RIGHT GREAT TOE   Final   Special Requests ZOSYN,VANCOMYCIN   Final   Gram Stain     Final   Value: FEW WBC PRESENT, PREDOMINANTLY PMN     NO SQUAMOUS EPITHELIAL CELLS SEEN     RARE GRAM POSITIVE COCCI     IN PAIRS   Culture     Final   Value: NO ANAEROBES ISOLATED; CULTURE IN PROGRESS FOR 5 DAYS   Report Status PENDING   Incomplete     Labs: Basic Metabolic Panel:  Recent Labs Lab 05/20/13 2123  05/21/13 0433 05/21/13 1455 05/22/13 0433 05/23/13 0405 05/24/13 0435 05/25/13 0410  NA 129*  --  128* 125* 129* 133* 134* 132*  K 3.2*  --  2.5* 3.5 4.6 4.1 4.4 4.5  CL 93*  --  90* 91* 94* 100 101 97  CO2  --   < > 27 24 23 24 25 24   GLUCOSE 192*  --  249* 183* 99 50* 61* 148*  BUN 24*  --  27* 23 20 11 9 9   CREATININE 2.00*  --  1.69* 1.44* 1.42* 1.14* 1.06 1.11*  CALCIUM  --   < > 9.3 9.1 8.9 9.1 9.1 9.1  MG  --   --  2.0  --   --   --   --   --   < > = values in this interval not displayed. Liver Function Tests:  Recent Labs Lab 05/25/13 0410  AST 44*  ALT 57*  ALKPHOS 312*  BILITOT 0.3  PROT 6.8  ALBUMIN 2.1*   No results found for this basename: LIPASE, AMYLASE,  in the last 168 hours No results found for this basename: AMMONIA,  in the last 168 hours CBC:  Recent Labs Lab 05/20/13 2115  05/21/13 0433 05/22/13 0433 05/23/13 0405 05/24/13 0435 05/25/13 0410  WBC 22.2*  --  15.6* 14.2* 11.9* 11.9* 12.0*  NEUTROABS 18.4*  --  12.6*  --   --   --   --   HGB 10.1*  < > 8.9* 7.9* 7.5* 10.0* 10.4*  HCT 29.7*  < > 26.5* 23.2* 22.4* 29.8* 31.5*  MCV 84.4  --  83.3 84.7 85.5 85.1 86.1  PLT 596*  --  536* 524* 551* 504* 592*  < > = values in this interval not displayed. Cardiac Enzymes: No results found for this basename: CKTOTAL, CKMB, CKMBINDEX, TROPONINI,  in the last 168 hours BNP: BNP (last 3 results) No results found for this basename: PROBNP,  in the last 8760  hours CBG:  Recent Labs Lab 05/25/13 1146 05/25/13 1640 05/25/13 2112 05/26/13 0749 05/26/13  1258  GLUCAP 223* 73 118* 174* 205*       Signed:  Amous Crewe S  [facility] 05/26/2013, 2:01 PM

## 2013-05-26 NOTE — Progress Notes (Signed)
   Subjective: 3 Days Post-Op Procedure(s) (LRB): IRRIGATION AND DEBRIDEMENT RIGHT GREAT TOE (Right)   Patient reports pain as mild, controlled with medication. No events throughout the night. Wound vac was removed today to look at wound and a wet to dry dressing was placed over the medial aspect of the great toe. Discussion was had with the patient that the toe appear to be dark in color, but are possibly viable still. She is told that we are attempting to save the toes, but that if interventions do not work that it may lead to amputation of the 1st and 2nd toes.  She understands and would like to proceed with attempts to salvage the 2 toes.  There is decreased swelling in the area with some bleeding of the medial aspect of the great toe.  Objective:   VITALS:   Filed Vitals:   05/26/13 0435  BP: 125/66  Pulse: 77  Temp: 98.1 F (36.7 C)  Resp: 18    Incision: scant drainage No cellulitis present Compartment soft  LABS  Recent Labs  05/24/13 0435 05/25/13 0410  HGB 10.0* 10.4*  HCT 29.8* 31.5*  WBC 11.9* 12.0*  PLT 504* 592*     Recent Labs  05/24/13 0435 05/25/13 0410  NA 134* 132*  K 4.4 4.5  BUN 9 9  CREATININE 1.06 1.11*  GLUCOSE 61* 148*     Assessment/Plan: 3 Days Post-Op Procedure(s) (LRB): IRRIGATION AND DEBRIDEMENT RIGHT GREAT TOE (Right)  Consult for the wound care nurse.  There is exposed tendon and capsule, but not a lot of healthy muscle. We would appreciate the wound care nurse's recommendations for wound vac versus wet to dry. Plan wound be for discharge home with whatever is recommended made by the wound care nurse. Follow up in 2 weeks at [facility]. Follow up with Tara Hopkins,Tara Hopkins D in 10-14 days.  Contact information:  Tara Hopkins [address], [address] [place] [address] [phone]        Tara Hopkins. Tara Hopkins   PAC  05/26/2013, 8:14 AM

## 2013-05-26 NOTE — Progress Notes (Signed)
Pt for discharge to [facility].  CSW facilitated pt discharge needs including contacting facility, faxing pt discharge information via TLC, discussing with pt and pt family at bedside, and providing discharge packet at bedside to pt and pt daughter as pt daughter plans to transport pt via private vehicle.  No further social work needs identified at this time.  CSW signing off.   Jacklynn Lewis, MSW, LCSWA  Clinical Social Work [phone]

## 2013-05-26 NOTE — Progress Notes (Signed)
CSW received notification from MD that pt medically ready for discharge.  CSW spoke with [facility] who stated that facility received [facility] insurance authorization for pt to admit to SNF today.  CSW notified pt at bedside and pt daughter via telephone.  CSW sent pt discharge information via TLC.  CSW to assist with pt discharge this afternoon.  Jacklynn Lewis, MSW, LCSWA  Clinical Social Work [phone]

## 2013-05-26 NOTE — Progress Notes (Signed)
Pt ready for d/c to [facility] via daughter. PIV Removed, WNL. No change in patient condition since AM assessment. Report given to Public librarian at [facility]. Pt d/c'd to [facility] via daughter.

## 2013-05-26 NOTE — Progress Notes (Signed)
CSW met with pt and pt daughter this morning re: decision for SNF.   Pt and pt daughter chose bed at [facility].   CSW contacted [employer] to notify and facility initiated [employer] authorization.   CSW discussed with pt and pt daughter that if pt becomes medically stable for discharge prior to insurance approval for SNF then pt will likely have to discharge to home with home health services until response is received from insurance company about SNF.   Pt and pt daughter are hesitant about this, but pt daughter appears to recognize the limitations of pt insurance.  CSW notified RNCM.  Awaiting determination from MD about when pt will be medically stable for discharge.  CSW to follow up with [facility] re: status of insurance authorization.   CSW to continue to follow.   Jacklynn Lewis, MSW, LCSWA  Clinical Social Work [phone]

## 2013-05-26 NOTE — Progress Notes (Signed)
Inpatient Diabetes Program Recommendations  AACE/ADA: New Consensus Statement on Inpatient Glycemic Control (2013)  Target Ranges:  Prepandial:   less than 140 mg/dL      Peak postprandial:   less than 180 mg/dL (1-2 hours)      Critically ill patients:  140 - 180 mg/dL   Reason for Visit: Hypoglycemia  Lantus 60 QHS has been held for past 2 doses as pt was hypoglycemic.  Home dose is Lantus 60 units QHS and HgbA1C is 14.4%?? Doubtful pt is taking insulin as prescribed and eating CHO mod diet.  Inpatient Diabetes Program Recommendations Insulin - Basal: Decrease Lantus to 30 units QHS d/t hypoglycemia Oral Agents: D/C Amaryl and tradjenta while inpatient. Restart at discharge.  Note: Would benefit from OP Diabetes Education consult.  Will inquire if pt would be willing to go.  Thank you. Ailene Ards, RD, LDN, CDE Inpatient Diabetes Coordinator [phone]

## 2013-05-26 NOTE — Consult Note (Signed)
WOC consult Note Reason for Consult: evaluation of the surgical site L foot. M. Babish PA contacted me this am about this patient, he had removed the NPWT VAC to see how the wound looked and the ortho group wound now like recommendations on whether or not to restart the [facility] or go with other topical care.  Wound type: surgical Measurement:4.5cm 3cm x 0.2cm with incisions with sutures that extend along the lateral foot and some sutures are intact at the great toe.  Wound bed: The open wound has a mostly yellow base with some early granulation tissue. However the great toe has quite a bit of macerated tissue and the tissue has some partial thickness skin loss of the dorsal foot and great toe.  She has some blistering of the dorsal foot and erythema.   Drainage (amount, consistency, odor) moderate sanguinous drainage.   Periwound: maceration, peeling and erythema.    Dressing procedure/placement/frequency: Since the toes have the maceration I will add xeroform gauze around the toes and over the dorsal foot for the bismuth, drying effects and the antibacterial properties.  Hydrogel for the open wound to keep moist for moist wound healing, cover with dry dressing. I have demonstrated to the patient to limit her WB on the foot to the heel if possible.   Discussed with patient and the bedside nurse, she will implement dressing once supplies up on the floor.  Contacted CM with new dressing change orders and frequency.   Re consult if needed, will not follow at this time. Thanks  Chantz Montefusco [employer], CWOCN [phone])

## 2013-05-28 LAB — ANAEROBIC CULTURE

## 2013-08-07 ENCOUNTER — Encounter (HOSPITAL_BASED_OUTPATIENT_CLINIC_OR_DEPARTMENT_OTHER): Payer: BC Managed Care – PPO | Attending: General Surgery

## 2013-08-07 DIAGNOSIS — Y835 Amputation of limb(s) as the cause of abnormal reaction of the patient, or of later complication, without mention of misadventure at the time of the procedure: Secondary | ICD-10-CM | POA: Insufficient documentation

## 2013-08-07 DIAGNOSIS — S98119A Complete traumatic amputation of unspecified great toe, initial encounter: Secondary | ICD-10-CM | POA: Insufficient documentation

## 2013-08-07 DIAGNOSIS — S98139A Complete traumatic amputation of one unspecified lesser toe, initial encounter: Secondary | ICD-10-CM | POA: Insufficient documentation

## 2013-08-07 DIAGNOSIS — T8789 Other complications of amputation stump: Secondary | ICD-10-CM | POA: Insufficient documentation

## 2013-08-07 DIAGNOSIS — E119 Type 2 diabetes mellitus without complications: Secondary | ICD-10-CM | POA: Insufficient documentation

## 2013-08-07 DIAGNOSIS — I10 Essential (primary) hypertension: Secondary | ICD-10-CM | POA: Insufficient documentation

## 2013-08-08 NOTE — Progress Notes (Signed)
[facility]  NAME:  Tara Hopkins, Tara Hopkins            ACCOUNT NO.:  [id number]  MEDICAL RECORD NO.:  [id number]      DATE OF BIRTH:  Mar 06, 1951  PHYSICIAN:  Ardath Sax, M.D.           VISIT DATE:                                  OFFICE VISIT   A 62 year old female who unfortunately had her right first and second toes amputated about a month ago and they are not healing, sloughed out a lot of the soft tissue and there was a wound about 6 or 7 cm x 3 cm on the medial aspect of her right foot.  She is a diabetic.  I debrided it today and we will treat it with Santyl and hopefully we can get her in HBO and use a Dermagraft.  Otherwise, she is very healthy.  Her temperature was 98, pulse 78, respiration 16, blood pressure was 183/84.  She weighs 153 pounds.  Her blood glucose today was 90.  She recently had a chest x-ray ordered by the doctor that was seen her in [place] and her chest x-ray is totally normal.  The patient was operated in September of this year for gangrenous changes of her first and second toes of the right foot.  Dr. Allena Katz was her surgeon.  She recently has had an IV started and she is getting IV Rocephin and vancomycin that is being done in her home by visiting nurses daily. Today, I felt she would be helped a great deal by frequent dressing changes and eventually workup to Dermagraft when all of the necrotic material has been debrided and we could help this with Dermagraft and HBO.  DIAGNOSES:  Diabetes mellitus, hypertension, recent amputation of right first and second toes with a nonhealing wound and type 2 diabetes.     Ardath Sax, M.D.     PP/MEDQ  D:  08/07/2013  T:  08/08/2013  Job:  [id number]

## 2013-08-14 ENCOUNTER — Encounter (HOSPITAL_BASED_OUTPATIENT_CLINIC_OR_DEPARTMENT_OTHER): Payer: BC Managed Care – PPO | Attending: General Surgery

## 2013-08-14 DIAGNOSIS — E1169 Type 2 diabetes mellitus with other specified complication: Secondary | ICD-10-CM | POA: Insufficient documentation

## 2013-08-14 DIAGNOSIS — T8189XA Other complications of procedures, not elsewhere classified, initial encounter: Secondary | ICD-10-CM | POA: Insufficient documentation

## 2013-08-14 DIAGNOSIS — Y838 Other surgical procedures as the cause of abnormal reaction of the patient, or of later complication, without mention of misadventure at the time of the procedure: Secondary | ICD-10-CM | POA: Insufficient documentation

## 2013-08-14 DIAGNOSIS — L97509 Non-pressure chronic ulcer of other part of unspecified foot with unspecified severity: Secondary | ICD-10-CM | POA: Insufficient documentation

## 2013-08-14 LAB — GLUCOSE, CAPILLARY: Glucose-Capillary: 115 mg/dL — ABNORMAL HIGH (ref 70–99)

## 2013-08-16 LAB — GLUCOSE, CAPILLARY
Glucose-Capillary: 140 mg/dL — ABNORMAL HIGH (ref 70–99)
Glucose-Capillary: 82 mg/dL (ref 70–99)

## 2013-08-18 LAB — GLUCOSE, CAPILLARY
Glucose-Capillary: 167 mg/dL — ABNORMAL HIGH (ref 70–99)
Glucose-Capillary: 126 mg/dL — ABNORMAL HIGH (ref 70–99)

## 2013-08-23 LAB — GLUCOSE, CAPILLARY: Glucose-Capillary: 112 mg/dL — ABNORMAL HIGH (ref 70–99)

## 2013-08-24 LAB — GLUCOSE, CAPILLARY
Glucose-Capillary: 190 mg/dL — ABNORMAL HIGH (ref 70–99)
Glucose-Capillary: 164 mg/dL — ABNORMAL HIGH (ref 70–99)

## 2013-08-25 LAB — GLUCOSE, CAPILLARY: Glucose-Capillary: 138 mg/dL — ABNORMAL HIGH (ref 70–99)

## 2013-09-04 LAB — GLUCOSE, CAPILLARY: Glucose-Capillary: 178 mg/dL — ABNORMAL HIGH (ref 70–99)

## 2013-09-05 LAB — GLUCOSE, CAPILLARY: Glucose-Capillary: 190 mg/dL — ABNORMAL HIGH (ref 70–99)

## 2013-09-06 LAB — GLUCOSE, CAPILLARY: Glucose-Capillary: 176 mg/dL — ABNORMAL HIGH (ref 70–99)

## 2013-09-07 LAB — GLUCOSE, CAPILLARY
Glucose-Capillary: 170 mg/dL — ABNORMAL HIGH (ref 70–99)
Glucose-Capillary: 191 mg/dL — ABNORMAL HIGH (ref 70–99)

## 2013-09-11 ENCOUNTER — Encounter (HOSPITAL_BASED_OUTPATIENT_CLINIC_OR_DEPARTMENT_OTHER): Payer: BC Managed Care – PPO | Attending: General Surgery

## 2013-09-11 DIAGNOSIS — S98139A Complete traumatic amputation of one unspecified lesser toe, initial encounter: Secondary | ICD-10-CM | POA: Insufficient documentation

## 2013-09-11 DIAGNOSIS — L97509 Non-pressure chronic ulcer of other part of unspecified foot with unspecified severity: Secondary | ICD-10-CM | POA: Insufficient documentation

## 2013-09-11 DIAGNOSIS — T8189XA Other complications of procedures, not elsewhere classified, initial encounter: Secondary | ICD-10-CM | POA: Insufficient documentation

## 2013-09-11 DIAGNOSIS — Y835 Amputation of limb(s) as the cause of abnormal reaction of the patient, or of later complication, without mention of misadventure at the time of the procedure: Secondary | ICD-10-CM | POA: Insufficient documentation

## 2013-09-11 DIAGNOSIS — E1169 Type 2 diabetes mellitus with other specified complication: Secondary | ICD-10-CM | POA: Insufficient documentation

## 2013-09-12 LAB — GLUCOSE, CAPILLARY
Glucose-Capillary: 137 mg/dL — ABNORMAL HIGH (ref 70–99)
Glucose-Capillary: 134 mg/dL — ABNORMAL HIGH (ref 70–99)

## 2013-09-13 LAB — GLUCOSE, CAPILLARY
Glucose-Capillary: 147 mg/dL — ABNORMAL HIGH (ref 70–99)
Glucose-Capillary: 164 mg/dL — ABNORMAL HIGH (ref 70–99)

## 2013-09-14 LAB — GLUCOSE, CAPILLARY: Glucose-Capillary: 187 mg/dL — ABNORMAL HIGH (ref 70–99)

## 2013-09-15 LAB — GLUCOSE, CAPILLARY
Glucose-Capillary: 177 mg/dL — ABNORMAL HIGH (ref 70–99)
Glucose-Capillary: 146 mg/dL — ABNORMAL HIGH (ref 70–99)

## 2013-09-18 LAB — GLUCOSE, CAPILLARY
Glucose-Capillary: 158 mg/dL — ABNORMAL HIGH (ref 70–99)
Glucose-Capillary: 174 mg/dL — ABNORMAL HIGH (ref 70–99)

## 2013-09-20 ENCOUNTER — Ambulatory Visit (INDEPENDENT_AMBULATORY_CARE_PROVIDER_SITE_OTHER): Payer: BC Managed Care – PPO | Admitting: Family

## 2013-09-20 ENCOUNTER — Encounter: Payer: Self-pay | Admitting: Family

## 2013-09-20 VITALS — BP 110/80 | HR 81 | Wt 156.0 lb

## 2013-09-20 DIAGNOSIS — F411 Generalized anxiety disorder: Secondary | ICD-10-CM

## 2013-09-20 DIAGNOSIS — E1151 Type 2 diabetes mellitus with diabetic peripheral angiopathy without gangrene: Secondary | ICD-10-CM

## 2013-09-20 DIAGNOSIS — E1149 Type 2 diabetes mellitus with other diabetic neurological complication: Secondary | ICD-10-CM

## 2013-09-20 DIAGNOSIS — F329 Major depressive disorder, single episode, unspecified: Secondary | ICD-10-CM

## 2013-09-20 DIAGNOSIS — E1142 Type 2 diabetes mellitus with diabetic polyneuropathy: Secondary | ICD-10-CM

## 2013-09-20 DIAGNOSIS — I798 Other disorders of arteries, arterioles and capillaries in diseases classified elsewhere: Secondary | ICD-10-CM

## 2013-09-20 DIAGNOSIS — E876 Hypokalemia: Secondary | ICD-10-CM

## 2013-09-20 DIAGNOSIS — E1140 Type 2 diabetes mellitus with diabetic neuropathy, unspecified: Secondary | ICD-10-CM

## 2013-09-20 DIAGNOSIS — E871 Hypo-osmolality and hyponatremia: Secondary | ICD-10-CM

## 2013-09-20 DIAGNOSIS — E1159 Type 2 diabetes mellitus with other circulatory complications: Secondary | ICD-10-CM

## 2013-09-20 LAB — LIPID PANEL
Cholesterol: 212 mg/dL — ABNORMAL HIGH (ref 0–200)
HDL: 37.2 mg/dL — ABNORMAL LOW (ref >39.00)
Total CHOL/HDL Ratio: 6
Triglycerides: 201 mg/dL — ABNORMAL HIGH (ref 0.0–149.0)
VLDL: 40.2 mg/dL — ABNORMAL HIGH (ref 0.0–40.0)

## 2013-09-20 LAB — BASIC METABOLIC PANEL
BUN: 22 mg/dL (ref 6–23)
CO2: 28 mEq/L (ref 19–32)
Chloride: 104 mEq/L (ref 96–112)
GFR: 47.17 mL/min — ABNORMAL LOW (ref >60.00)
Glucose, Bld: 154 mg/dL — ABNORMAL HIGH (ref 70–99)
Potassium: 3.8 mEq/L (ref 3.5–5.1)

## 2013-09-20 LAB — GLUCOSE, CAPILLARY
Glucose-Capillary: 113 mg/dL — ABNORMAL HIGH (ref 70–99)
Glucose-Capillary: 143 mg/dL — ABNORMAL HIGH (ref 70–99)

## 2013-09-20 LAB — CBC WITH DIFFERENTIAL/PLATELET
Basophils Absolute: 0 10*3/uL (ref 0.0–0.1)
Eosinophils Relative: 2.7 % (ref 0.0–5.0)
HCT: 35.5 % — ABNORMAL LOW (ref 36.0–46.0)
Lymphocytes Relative: 34 % (ref 12.0–46.0)
Lymphs Abs: 2.9 10*3/uL (ref 0.7–4.0)
MCV: 86.1 fl (ref 78.0–100.0)
Monocytes Relative: 4.9 % (ref 3.0–12.0)
Neutrophils Relative %: 58 % (ref 43.0–77.0)
Platelets: 344 10*3/uL (ref 150.0–400.0)
RDW: 14 % (ref 11.5–14.6)
WBC: 8.4 10*3/uL (ref 4.5–10.5)

## 2013-09-20 LAB — HEPATIC FUNCTION PANEL
ALT: 23 U/L (ref 0–35)
AST: 20 U/L (ref 0–37)
Albumin: 3.6 g/dL (ref 3.5–5.2)
Alkaline Phosphatase: 84 U/L (ref 39–117)
Total Bilirubin: 0.5 mg/dL (ref 0.3–1.2)
Total Protein: 7.5 g/dL (ref 6.0–8.3)

## 2013-09-20 LAB — HEMOGLOBIN A1C: Hgb A1c MFr Bld: 7.2 % — ABNORMAL HIGH (ref 4.6–6.5)

## 2013-09-20 MED ORDER — ALPRAZOLAM 0.25 MG PO TABS: 0.2500 mg | ORAL_TABLET | Freq: Every day | ORAL | Status: DC | PRN

## 2013-09-20 MED ORDER — LAMOTRIGINE 25 MG PO TABS: 25.0000 mg | ORAL_TABLET | Freq: Every day | ORAL | Status: DC

## 2013-09-20 NOTE — Patient Instructions (Signed)

## 2013-09-20 NOTE — Progress Notes (Signed)
Subjective:    Patient ID: Tara Hopkins, female    DOB: Dec 22, 1950, 62 y.o.   MRN: [id number]  HPI  And 62 year old [place] American female, new patient to the practice and to be established. 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 She's currently stable. She sees the hyperbaric center is taking care of the amputations on her right foot. She was diagnosed with type 2 diabetes and 2012. Since that time, reports that her blood sugars have been well controlled. Diabetic eye exam a month ago. On immunizations are up-to-date. She had a Pap and pelvic exam per her report in June 2014. Denies any concerns today.  Review of Systems  Constitutional: Negative.   HENT: Negative.   Respiratory: Negative.   Cardiovascular: Negative.   Gastrointestinal: Negative.   Endocrine: Negative.  Negative for cold intolerance and polyphagia.  Genitourinary: Negative.   Musculoskeletal: Negative.        Amputation to the right foot.   Neurological: Positive for numbness.       Numbness in th right foot at times.   Psychiatric/Behavioral: Negative.    Past Medical History  Diagnosis Date  . Diabetes mellitus without complication   . Hypertension   . Hypercholesteremia   . Peripheral neuropathy   . Depression     History   Social History  . Marital Status: Married    Spouse Name: N/A    Number of Children: N/A  . Years of Education: N/A   Occupational History  . Not on file.   Social History Main Topics  . Smoking status: Current Every Day Smoker    Types: Cigarettes  . Smokeless tobacco: Not on file  . Alcohol Use: No  . Drug Use: No  . Sexual Activity:    Other Topics Concern  . Not on file   Social History Narrative  . No narrative on file    Past Surgical History  Procedure Laterality Date  . I&d extremity Right 05/23/2013    Procedure:  IRRIGATION AND DEBRIDEMENT RIGHT GREAT TOE;  Surgeon: Shelda Pal, MD;  Location: [facility];  Service: Orthopedics;  Laterality: Right;    No family history on file.  No Known Allergies  Current Outpatient Prescriptions on File Prior to Visit  Medication Sig Dispense Refill  . COD LIVER OIL PO Take 1 capsule by mouth daily.      Marland Kitchen gabapentin (NEURONTIN) 300 MG capsule Take 600 mg by mouth at bedtime.      Marland Kitchen glimepiride (AMARYL) 4 MG tablet Take 4 mg by mouth daily before breakfast.      . insulin glargine (LANTUS) 100 UNIT/ML injection Inject 30 Units into the skin at bedtime.       Marland Kitchen latanoprost (XALATAN) 0.005 % ophthalmic solution Place 1 drop into both eyes at bedtime.      . Lurasidone HCl (LATUDA) 60 MG TABS Take 60 mg by mouth daily.      . saxagliptin HCl (ONGLYZA) 5 MG TABS tablet Take 5 mg by mouth daily.      Marland Kitchen aspirin EC 81 MG tablet Take 81 mg by mouth daily.      . Calcium Carb-Cholecalciferol (CALCIUM + D3) 600-200 MG-UNIT TABS Take 1 tablet by mouth daily.      . cholecalciferol (VITAMIN D) 400 UNITS TABS Take 400 Units by mouth daily.      . collagenase (SANTYL)  ointment Apply 1 application topically daily.      . ferrous gluconate (FERGON) 325 MG tablet Take 325 mg by mouth daily with breakfast.      . fish oil-omega-3 fatty acids 1000 MG capsule Take 1 g by mouth daily.      . hydrochlorothiazide (HYDRODIURIL) 25 MG tablet Take 25 mg by mouth daily.      Marland Kitchen lisinopril (PRINIVIL,ZESTRIL) 5 MG tablet Take 5 mg by mouth daily.      Marland Kitchen oxyCODONE-acetaminophen (PERCOCET/ROXICET) 5-325 MG per tablet Take 1-2 tablets by mouth every 6 (six) hours as needed.  30 tablet  0  . rosuvastatin (CRESTOR) 10 MG tablet Take 10 mg by mouth daily.      . sertraline (ZOLOFT) 50 MG tablet Take 50 mg by mouth daily.      Marland Kitchen thiamine 100 MG tablet Take 100 mg by mouth daily.      . vitamin C (ASCORBIC ACID) 500 MG tablet Take 500 mg by mouth daily.      . vitamin E 100 UNIT capsule Take 100  Units by mouth daily.       No current facility-administered medications on file prior to visit.    BP 110/80  Pulse 81  Wt 156 lb (70.761 kg)chart    Objective:   Physical Exam  Constitutional: She is oriented to person, place, and time. She appears well-developed and well-nourished.  HENT:  Right Ear: External ear normal.  Left Ear: External ear normal.  Nose: Nose normal.  Mouth/Throat: Oropharynx is clear and moist.  Neck: Normal range of motion. Neck supple.  Cardiovascular: Normal rate, regular rhythm and normal heart sounds.   Pulmonary/Chest: Effort normal and breath sounds normal.  Abdominal: Soft. Bowel sounds are normal.  Musculoskeletal: Normal range of motion.  Right foot wrapped per Hyperbaric center  Neurological: She is alert and oriented to person, place, and time. She has normal reflexes.  Skin: Skin is warm and dry.  Psychiatric: She has a normal mood and affect.          Assessment & Plan:  Assessment: 1. Diabetes with peripheral neuropathy 2. Anxiety 3. Depression 4. Hypertension 5. Hyponatremia 6 hypokalemia .  Plan: Continue current medications. I have sent to include A1c, lipids, BMP, LFTs, CBC with the patient and the results. Encouraged healthy diet, exercise, nightly he checks. Followup with hyperbaric center and schedule. Followup here in 3 months sooner as needed.

## 2013-09-22 ENCOUNTER — Telehealth: Payer: Self-pay | Admitting: Family

## 2013-09-22 LAB — GLUCOSE, CAPILLARY: Glucose-Capillary: 145 mg/dL — ABNORMAL HIGH (ref 70–99)

## 2013-09-22 NOTE — Telephone Encounter (Signed)
pls call pt back after 12 noon. Returning your call about lab results.

## 2013-09-22 NOTE — Telephone Encounter (Signed)
Left message for pt to call back  °

## 2013-09-25 ENCOUNTER — Telehealth: Payer: Self-pay

## 2013-09-25 MED ORDER — SIMVASTATIN 20 MG PO TABS: 20.0000 mg | ORAL_TABLET | Freq: Every day | ORAL | Status: DC

## 2013-09-25 NOTE — Telephone Encounter (Signed)
Left message to advise pt cheaper Rx sent to pharmacy

## 2013-09-25 NOTE — Telephone Encounter (Signed)
Spoke with pt about taking cholesterol medication. She states that she is not taking the Crestor because it is too expensive. Can she get a cheaper med? Please advise

## 2013-09-25 NOTE — Telephone Encounter (Signed)
Please advise patient I have sent in a cheaper med

## 2013-09-26 LAB — GLUCOSE, CAPILLARY
Glucose-Capillary: 121 mg/dL — ABNORMAL HIGH (ref 70–99)
Glucose-Capillary: 133 mg/dL — ABNORMAL HIGH (ref 70–99)

## 2013-09-27 LAB — GLUCOSE, CAPILLARY
Glucose-Capillary: 172 mg/dL — ABNORMAL HIGH (ref 70–99)
Glucose-Capillary: 188 mg/dL — ABNORMAL HIGH (ref 70–99)

## 2013-09-28 ENCOUNTER — Other Ambulatory Visit (HOSPITAL_COMMUNITY): Payer: Self-pay | Admitting: General Surgery

## 2013-09-28 ENCOUNTER — Ambulatory Visit (HOSPITAL_COMMUNITY)
Admission: RE | Admit: 2013-09-28 | Discharge: 2013-09-28 | Disposition: A | Discharge status: Home / Self Care | Payer: BC Managed Care – PPO | Source: Ambulatory Visit | Attending: General Surgery | Admitting: General Surgery

## 2013-09-28 DIAGNOSIS — S92919A Unspecified fracture of unspecified toe(s), initial encounter for closed fracture: Secondary | ICD-10-CM | POA: Insufficient documentation

## 2013-09-28 DIAGNOSIS — S98119A Complete traumatic amputation of unspecified great toe, initial encounter: Secondary | ICD-10-CM | POA: Insufficient documentation

## 2013-09-28 DIAGNOSIS — M869 Osteomyelitis, unspecified: Secondary | ICD-10-CM

## 2013-09-28 DIAGNOSIS — X58XXXA Exposure to other specified factors, initial encounter: Secondary | ICD-10-CM | POA: Insufficient documentation

## 2013-09-28 LAB — GLUCOSE, CAPILLARY: Glucose-Capillary: 172 mg/dL — ABNORMAL HIGH (ref 70–99)

## 2013-09-29 LAB — GLUCOSE, CAPILLARY: Glucose-Capillary: 148 mg/dL — ABNORMAL HIGH (ref 70–99)

## 2013-10-03 LAB — GLUCOSE, CAPILLARY: Glucose-Capillary: 138 mg/dL — ABNORMAL HIGH (ref 70–99)

## 2013-10-09 ENCOUNTER — Encounter (HOSPITAL_BASED_OUTPATIENT_CLINIC_OR_DEPARTMENT_OTHER): Payer: BC Managed Care – PPO | Attending: General Surgery

## 2013-10-09 DIAGNOSIS — T8189XA Other complications of procedures, not elsewhere classified, initial encounter: Secondary | ICD-10-CM | POA: Insufficient documentation

## 2013-10-09 DIAGNOSIS — S98139A Complete traumatic amputation of one unspecified lesser toe, initial encounter: Secondary | ICD-10-CM | POA: Insufficient documentation

## 2013-10-09 DIAGNOSIS — S98119A Complete traumatic amputation of unspecified great toe, initial encounter: Secondary | ICD-10-CM | POA: Insufficient documentation

## 2013-10-09 DIAGNOSIS — E119 Type 2 diabetes mellitus without complications: Secondary | ICD-10-CM | POA: Insufficient documentation

## 2013-10-09 DIAGNOSIS — Y835 Amputation of limb(s) as the cause of abnormal reaction of the patient, or of later complication, without mention of misadventure at the time of the procedure: Secondary | ICD-10-CM | POA: Insufficient documentation

## 2013-10-09 LAB — GLUCOSE, CAPILLARY: Glucose-Capillary: 128 mg/dL — ABNORMAL HIGH (ref 70–99)

## 2013-10-10 LAB — GLUCOSE, CAPILLARY: Glucose-Capillary: 139 mg/dL — ABNORMAL HIGH (ref 70–99)

## 2013-10-12 LAB — GLUCOSE, CAPILLARY
Glucose-Capillary: 105 mg/dL — ABNORMAL HIGH (ref 70–99)
Glucose-Capillary: 169 mg/dL — ABNORMAL HIGH (ref 70–99)

## 2013-10-13 LAB — GLUCOSE, CAPILLARY
Glucose-Capillary: 144 mg/dL — ABNORMAL HIGH (ref 70–99)
Glucose-Capillary: 114 mg/dL — ABNORMAL HIGH (ref 70–99)

## 2013-10-14 NOTE — Progress Notes (Signed)
[facility]  NAMEFIDELIA, CATHERS            ACCOUNT NO.:  [id number]  MEDICAL RECORD NO.:  [id number]      DATE OF BIRTH:  May 27, 1951  PHYSICIAN:  Ardath Sax, M.D.           VISIT DATE:                                  OFFICE VISIT   Mrs. Ruggerio is a 62 year old female who underwent about 4 months ago, amputations of her right first and second toes.  Unfortunately, the wound did not heal and she was sent to [place] last July with nonhealing wound.  She is a diabetic and a wound at that time, was 7 cm long and 3 cm wide on the medial aspect of her right foot.  At that time, we started her on HBO and it has significantly improved.  It is now only 2.5 cm long and 0.5 cm wide which is considerable improvement and I would like very much to continue her on for another set of treatments for hyperbaric oxygen therapy, as it has considerably helped the healing of this most difficult diabetic nonhealing surgical wound.     Ardath Sax, M.D.     PP/MEDQ  D:  10/13/2013  T:  10/14/2013  Job:  [id number]

## 2013-10-19 LAB — GLUCOSE, CAPILLARY: Glucose-Capillary: 105 mg/dL — ABNORMAL HIGH (ref 70–99)

## 2013-10-20 LAB — GLUCOSE, CAPILLARY
Glucose-Capillary: 101 mg/dL — ABNORMAL HIGH (ref 70–99)
Glucose-Capillary: 69 mg/dL — ABNORMAL LOW (ref 70–99)

## 2013-10-23 LAB — GLUCOSE, CAPILLARY
Glucose-Capillary: 183 mg/dL — ABNORMAL HIGH (ref 70–99)
Glucose-Capillary: 111 mg/dL — ABNORMAL HIGH (ref 70–99)

## 2013-10-24 LAB — GLUCOSE, CAPILLARY
Glucose-Capillary: 106 mg/dL — ABNORMAL HIGH (ref 70–99)
Glucose-Capillary: 115 mg/dL — ABNORMAL HIGH (ref 70–99)

## 2013-10-25 LAB — GLUCOSE, CAPILLARY: Glucose-Capillary: 140 mg/dL — ABNORMAL HIGH (ref 70–99)

## 2013-11-06 LAB — GLUCOSE, CAPILLARY: Glucose-Capillary: 129 mg/dL — ABNORMAL HIGH (ref 70–99)

## 2013-11-07 LAB — GLUCOSE, CAPILLARY
Glucose-Capillary: 124 mg/dL — ABNORMAL HIGH (ref 70–99)
Glucose-Capillary: 175 mg/dL — ABNORMAL HIGH (ref 70–99)

## 2013-11-08 LAB — GLUCOSE, CAPILLARY: Glucose-Capillary: 110 mg/dL — ABNORMAL HIGH (ref 70–99)

## 2013-11-10 ENCOUNTER — Encounter (HOSPITAL_BASED_OUTPATIENT_CLINIC_OR_DEPARTMENT_OTHER): Payer: BC Managed Care – PPO | Attending: General Surgery

## 2013-11-10 DIAGNOSIS — L84 Corns and callosities: Secondary | ICD-10-CM | POA: Insufficient documentation

## 2013-11-10 DIAGNOSIS — E1169 Type 2 diabetes mellitus with other specified complication: Secondary | ICD-10-CM | POA: Insufficient documentation

## 2013-11-10 DIAGNOSIS — L97509 Non-pressure chronic ulcer of other part of unspecified foot with unspecified severity: Secondary | ICD-10-CM | POA: Insufficient documentation

## 2013-11-13 LAB — GLUCOSE, CAPILLARY
Glucose-Capillary: 85 mg/dL (ref 70–99)
Glucose-Capillary: 87 mg/dL (ref 70–99)
Glucose-Capillary: 147 mg/dL — ABNORMAL HIGH (ref 70–99)

## 2013-11-14 LAB — GLUCOSE, CAPILLARY
GLUCOSE-CAPILLARY: 142 mg/dL — AB (ref 70–99)
Glucose-Capillary: 172 mg/dL — ABNORMAL HIGH (ref 70–99)

## 2013-11-16 LAB — GLUCOSE, CAPILLARY
GLUCOSE-CAPILLARY: 148 mg/dL — AB (ref 70–99)
Glucose-Capillary: 135 mg/dL — ABNORMAL HIGH (ref 70–99)
Glucose-Capillary: 127 mg/dL — ABNORMAL HIGH (ref 70–99)
Glucose-Capillary: 145 mg/dL — ABNORMAL HIGH (ref 70–99)

## 2013-11-17 LAB — GLUCOSE, CAPILLARY
GLUCOSE-CAPILLARY: 171 mg/dL — AB (ref 70–99)
GLUCOSE-CAPILLARY: 174 mg/dL — AB (ref 70–99)

## 2013-11-20 LAB — GLUCOSE, CAPILLARY
Glucose-Capillary: 180 mg/dL — ABNORMAL HIGH (ref 70–99)
Glucose-Capillary: 185 mg/dL — ABNORMAL HIGH (ref 70–99)

## 2013-11-23 LAB — GLUCOSE, CAPILLARY
Glucose-Capillary: 168 mg/dL — ABNORMAL HIGH (ref 70–99)
Glucose-Capillary: 174 mg/dL — ABNORMAL HIGH (ref 70–99)

## 2013-11-24 LAB — GLUCOSE, CAPILLARY
Glucose-Capillary: 172 mg/dL — ABNORMAL HIGH (ref 70–99)
Glucose-Capillary: 99 mg/dL (ref 70–99)

## 2013-11-27 LAB — GLUCOSE, CAPILLARY
Glucose-Capillary: 157 mg/dL — ABNORMAL HIGH (ref 70–99)
Glucose-Capillary: 170 mg/dL — ABNORMAL HIGH (ref 70–99)

## 2013-11-28 LAB — GLUCOSE, CAPILLARY
GLUCOSE-CAPILLARY: 112 mg/dL — AB (ref 70–99)
GLUCOSE-CAPILLARY: 175 mg/dL — AB (ref 70–99)

## 2013-11-29 LAB — GLUCOSE, CAPILLARY
Glucose-Capillary: 156 mg/dL — ABNORMAL HIGH (ref 70–99)
Glucose-Capillary: 128 mg/dL — ABNORMAL HIGH (ref 70–99)

## 2013-11-30 LAB — GLUCOSE, CAPILLARY
GLUCOSE-CAPILLARY: 207 mg/dL — AB (ref 70–99)
Glucose-Capillary: 189 mg/dL — ABNORMAL HIGH (ref 70–99)

## 2013-12-04 LAB — GLUCOSE, CAPILLARY
Glucose-Capillary: 155 mg/dL — ABNORMAL HIGH (ref 70–99)
Glucose-Capillary: 201 mg/dL — ABNORMAL HIGH (ref 70–99)

## 2013-12-05 LAB — GLUCOSE, CAPILLARY
GLUCOSE-CAPILLARY: 184 mg/dL — AB (ref 70–99)
Glucose-Capillary: 188 mg/dL — ABNORMAL HIGH (ref 70–99)

## 2013-12-06 LAB — GLUCOSE, CAPILLARY
GLUCOSE-CAPILLARY: 158 mg/dL — AB (ref 70–99)
Glucose-Capillary: 129 mg/dL — ABNORMAL HIGH (ref 70–99)

## 2013-12-07 LAB — GLUCOSE, CAPILLARY
GLUCOSE-CAPILLARY: 132 mg/dL — AB (ref 70–99)
Glucose-Capillary: 153 mg/dL — ABNORMAL HIGH (ref 70–99)

## 2013-12-08 LAB — GLUCOSE, CAPILLARY
GLUCOSE-CAPILLARY: 184 mg/dL — AB (ref 70–99)
Glucose-Capillary: 224 mg/dL — ABNORMAL HIGH (ref 70–99)
Glucose-Capillary: 202 mg/dL — ABNORMAL HIGH (ref 70–99)

## 2013-12-11 ENCOUNTER — Encounter (HOSPITAL_BASED_OUTPATIENT_CLINIC_OR_DEPARTMENT_OTHER): Payer: BC Managed Care – PPO | Attending: General Surgery

## 2013-12-11 DIAGNOSIS — E1169 Type 2 diabetes mellitus with other specified complication: Secondary | ICD-10-CM | POA: Diagnosis present

## 2013-12-11 DIAGNOSIS — S98139A Complete traumatic amputation of one unspecified lesser toe, initial encounter: Secondary | ICD-10-CM | POA: Diagnosis not present

## 2013-12-11 DIAGNOSIS — L97509 Non-pressure chronic ulcer of other part of unspecified foot with unspecified severity: Secondary | ICD-10-CM | POA: Insufficient documentation

## 2013-12-12 LAB — GLUCOSE, CAPILLARY: Glucose-Capillary: 160 mg/dL — ABNORMAL HIGH (ref 70–99)

## 2013-12-12 NOTE — Progress Notes (Signed)
[facility]  NAMEANNAKA, CLEAVER            ACCOUNT NO.:  [id number]  MEDICAL RECORD NO.:  [id number]      DATE OF BIRTH:  [birth year]  PHYSICIAN:  Judene Companion, M.D.      VISIT DATE:  12/11/2013                                  OFFICE VISIT   Mrs. Bloch is a 63 year old lady who, four months ago, underwent amputations of her right first and second toes, and this wound did not heal and she was sent to [place] last July, we have been taking care of her since that time.  She is a diabetic and has her diabetes in good control.  The wound originally was 7-cm long and 3-cm wide on the medial aspect of her right foot.  This was treated as a nonhealing wound from her surgery and classified as a Wagner 3 ulcer.  The treatments that we did were Dermagraft and hyperbaric oxygen, and we have had considerable improvement, but we need to continue the hyperbaric oxygen therapy, and I would like to put her on another course of hyperbaric oxygen treatments for another five weeks.  I think that will help [place] considerably and healing these ulcers on her right foot.     Judene Companion, M.D.     PP/MEDQ  D:  12/11/2013  T:  12/12/2013  Job:  [id number]

## 2013-12-13 DIAGNOSIS — E1169 Type 2 diabetes mellitus with other specified complication: Secondary | ICD-10-CM | POA: Diagnosis not present

## 2013-12-14 ENCOUNTER — Encounter (HOSPITAL_COMMUNITY): Payer: Self-pay | Admitting: *Deleted

## 2013-12-14 ENCOUNTER — Encounter (HOSPITAL_COMMUNITY): Payer: Self-pay | Admitting: Pharmacy Technician

## 2013-12-14 ENCOUNTER — Other Ambulatory Visit (HOSPITAL_COMMUNITY): Payer: Self-pay | Admitting: Orthopedic Surgery

## 2013-12-15 ENCOUNTER — Encounter (HOSPITAL_COMMUNITY): Payer: Self-pay | Admitting: *Deleted

## 2013-12-15 ENCOUNTER — Encounter (HOSPITAL_COMMUNITY): Payer: BC Managed Care – PPO | Admitting: Critical Care Medicine

## 2013-12-15 ENCOUNTER — Ambulatory Visit (HOSPITAL_COMMUNITY)
Admission: RE | Admit: 2013-12-15 | Discharge: 2013-12-19 | Disposition: A | Discharge status: Skilled Nursing Facility | Payer: BC Managed Care – PPO | Source: Ambulatory Visit | Attending: Orthopedic Surgery | Admitting: Orthopedic Surgery

## 2013-12-15 ENCOUNTER — Ambulatory Visit (HOSPITAL_COMMUNITY): Payer: BC Managed Care – PPO

## 2013-12-15 ENCOUNTER — Encounter (HOSPITAL_COMMUNITY)
Admission: RE | Disposition: A | Discharge status: Skilled Nursing Facility | Payer: Self-pay | Source: Ambulatory Visit | Attending: Orthopedic Surgery

## 2013-12-15 ENCOUNTER — Ambulatory Visit (HOSPITAL_COMMUNITY): Payer: BC Managed Care – PPO | Admitting: Critical Care Medicine

## 2013-12-15 DIAGNOSIS — L97509 Non-pressure chronic ulcer of other part of unspecified foot with unspecified severity: Secondary | ICD-10-CM | POA: Insufficient documentation

## 2013-12-15 DIAGNOSIS — Z89431 Acquired absence of right foot: Secondary | ICD-10-CM

## 2013-12-15 DIAGNOSIS — D649 Anemia, unspecified: Secondary | ICD-10-CM | POA: Insufficient documentation

## 2013-12-15 DIAGNOSIS — E1149 Type 2 diabetes mellitus with other diabetic neurological complication: Secondary | ICD-10-CM | POA: Insufficient documentation

## 2013-12-15 DIAGNOSIS — F3289 Other specified depressive episodes: Secondary | ICD-10-CM | POA: Insufficient documentation

## 2013-12-15 DIAGNOSIS — L089 Local infection of the skin and subcutaneous tissue, unspecified: Secondary | ICD-10-CM

## 2013-12-15 DIAGNOSIS — E7800 Pure hypercholesterolemia, unspecified: Secondary | ICD-10-CM | POA: Insufficient documentation

## 2013-12-15 DIAGNOSIS — F17200 Nicotine dependence, unspecified, uncomplicated: Secondary | ICD-10-CM | POA: Insufficient documentation

## 2013-12-15 DIAGNOSIS — M869 Osteomyelitis, unspecified: Secondary | ICD-10-CM | POA: Insufficient documentation

## 2013-12-15 DIAGNOSIS — I10 Essential (primary) hypertension: Secondary | ICD-10-CM | POA: Insufficient documentation

## 2013-12-15 DIAGNOSIS — Z79899 Other long term (current) drug therapy: Secondary | ICD-10-CM | POA: Insufficient documentation

## 2013-12-15 DIAGNOSIS — F329 Major depressive disorder, single episode, unspecified: Secondary | ICD-10-CM | POA: Insufficient documentation

## 2013-12-15 DIAGNOSIS — E11628 Type 2 diabetes mellitus with other skin complications: Secondary | ICD-10-CM

## 2013-12-15 DIAGNOSIS — F411 Generalized anxiety disorder: Secondary | ICD-10-CM | POA: Insufficient documentation

## 2013-12-15 DIAGNOSIS — Z794 Long term (current) use of insulin: Secondary | ICD-10-CM | POA: Insufficient documentation

## 2013-12-15 HISTORY — PX: AMPUTATION: SHX166

## 2013-12-15 HISTORY — DX: Anxiety disorder, unspecified: F41.9

## 2013-12-15 HISTORY — DX: Personal history of other medical treatment: Z92.89

## 2013-12-15 LAB — COMPREHENSIVE METABOLIC PANEL
ALBUMIN: 3.3 g/dL — AB (ref 3.5–5.2)
ALT: 9 U/L (ref 0–35)
AST: 10 U/L (ref 0–37)
Alkaline Phosphatase: 115 U/L (ref 39–117)
BUN: 21 mg/dL (ref 6–23)
CALCIUM: 9.2 mg/dL (ref 8.4–10.5)
CO2: 26 mEq/L (ref 19–32)
Chloride: 101 mEq/L (ref 96–112)
Creatinine, Ser: 1.28 mg/dL — ABNORMAL HIGH (ref 0.50–1.10)
GFR calc Af Amer: 51 mL/min — ABNORMAL LOW (ref >90)
GFR, EST NON AFRICAN AMERICAN: 44 mL/min — AB (ref >90)
Glucose, Bld: 71 mg/dL (ref 70–99)
Potassium: 3.7 mEq/L (ref 3.7–5.3)
SODIUM: 141 meq/L (ref 137–147)
TOTAL PROTEIN: 7.5 g/dL (ref 6.0–8.3)
Total Bilirubin: 0.5 mg/dL (ref 0.3–1.2)

## 2013-12-15 LAB — GLUCOSE, CAPILLARY
GLUCOSE-CAPILLARY: 53 mg/dL — AB (ref 70–99)
GLUCOSE-CAPILLARY: 115 mg/dL — AB (ref 70–99)
GLUCOSE-CAPILLARY: 97 mg/dL (ref 70–99)
GLUCOSE-CAPILLARY: 266 mg/dL — AB (ref 70–99)
Glucose-Capillary: 82 mg/dL (ref 70–99)
Glucose-Capillary: 41 mg/dL — CL (ref 70–99)
Glucose-Capillary: 47 mg/dL — ABNORMAL LOW (ref 70–99)
Glucose-Capillary: 48 mg/dL — ABNORMAL LOW (ref 70–99)
Glucose-Capillary: 70 mg/dL (ref 70–99)

## 2013-12-15 LAB — CBC
HCT: 27.5 % — ABNORMAL LOW (ref 36.0–46.0)
Hemoglobin: 9.2 g/dL — ABNORMAL LOW (ref 12.0–15.0)
MCH: 28.2 pg (ref 26.0–34.0)
MCHC: 33.5 g/dL (ref 30.0–36.0)
MCV: 84.4 fL (ref 78.0–100.0)
PLATELETS: 458 10*3/uL — AB (ref 150–400)
RBC: 3.26 MIL/uL — AB (ref 3.87–5.11)
RDW: 14.9 % (ref 11.5–15.5)
WBC: 15.4 10*3/uL — ABNORMAL HIGH (ref 4.0–10.5)

## 2013-12-15 LAB — APTT: aPTT: 46 seconds — ABNORMAL HIGH (ref 24–37)

## 2013-12-15 LAB — PROTIME-INR
INR: 1.21 (ref 0.00–1.49)
Prothrombin Time: 15 seconds (ref 11.6–15.2)

## 2013-12-15 SURGERY — AMPUTATION, FOOT, PARTIAL
Anesthesia: General | Site: Foot | Laterality: Right

## 2013-12-15 MED ORDER — INSULIN ASPART 100 UNIT/ML ~~LOC~~ SOLN
0.0000 [IU] | Freq: Three times a day (TID) | SUBCUTANEOUS | Status: DC
  Administered 2013-12-17 – 2013-12-18 (×3): 2 [IU] via SUBCUTANEOUS

## 2013-12-15 MED ORDER — METOCLOPRAMIDE HCL 5 MG PO TABS
5.0000 mg | ORAL_TABLET | Freq: Three times a day (TID) | ORAL | Status: DC | PRN
  Filled 2013-12-15: qty 2

## 2013-12-15 MED ORDER — ONDANSETRON HCL 4 MG/2ML IJ SOLN
INTRAMUSCULAR | Status: DC | PRN
  Administered 2013-12-15: 4 mg via INTRAVENOUS

## 2013-12-15 MED ORDER — LIDOCAINE HCL (CARDIAC) 20 MG/ML IV SOLN
INTRAVENOUS | Status: DC | PRN
  Administered 2013-12-15: 70 mg via INTRAVENOUS

## 2013-12-15 MED ORDER — LACTATED RINGERS IV SOLN
INTRAVENOUS | Status: DC
  Administered 2013-12-15: 13:00:00 via INTRAVENOUS

## 2013-12-15 MED ORDER — FENTANYL CITRATE 0.05 MG/ML IJ SOLN
INTRAMUSCULAR | Status: AC
  Filled 2013-12-15: qty 2

## 2013-12-15 MED ORDER — FENTANYL CITRATE 0.05 MG/ML IJ SOLN
INTRAMUSCULAR | Status: AC
  Filled 2013-12-15: qty 5

## 2013-12-15 MED ORDER — MIDAZOLAM HCL 2 MG/2ML IJ SOLN
INTRAMUSCULAR | Status: AC
  Filled 2013-12-15: qty 2

## 2013-12-15 MED ORDER — ONDANSETRON HCL 4 MG/2ML IJ SOLN: 4.0000 mg | Freq: Four times a day (QID) | INTRAMUSCULAR | Status: DC | PRN

## 2013-12-15 MED ORDER — CLONAZEPAM 0.5 MG PO TABS
0.5000 mg | ORAL_TABLET | Freq: Every day | ORAL | Status: DC
  Administered 2013-12-15 – 2013-12-18 (×4): 0.5 mg via ORAL
  Filled 2013-12-15 (×4): qty 1

## 2013-12-15 MED ORDER — CEFAZOLIN SODIUM-DEXTROSE 2-3 GM-% IV SOLR
2.0000 g | INTRAVENOUS | Status: AC
  Administered 2013-12-15: 2 g via INTRAVENOUS
  Filled 2013-12-15: qty 50

## 2013-12-15 MED ORDER — ASPIRIN EC 325 MG PO TBEC
325.0000 mg | DELAYED_RELEASE_TABLET | Freq: Every day | ORAL | Status: DC
  Administered 2013-12-16 – 2013-12-19 (×4): 325 mg via ORAL
  Filled 2013-12-15 (×5): qty 1

## 2013-12-15 MED ORDER — DEXTROSE 50 % IV SOLN
INTRAVENOUS | Status: AC
  Administered 2013-12-15: 25 mL via INTRAVENOUS
  Filled 2013-12-15: qty 50

## 2013-12-15 MED ORDER — FENTANYL CITRATE 0.05 MG/ML IJ SOLN
INTRAMUSCULAR | Status: DC | PRN
  Administered 2013-12-15: 50 ug via INTRAVENOUS

## 2013-12-15 MED ORDER — LISINOPRIL 5 MG PO TABS
5.0000 mg | ORAL_TABLET | Freq: Every day | ORAL | Status: DC
  Administered 2013-12-16 – 2013-12-19 (×3): 5 mg via ORAL
  Filled 2013-12-15 (×5): qty 1

## 2013-12-15 MED ORDER — HYDROMORPHONE HCL PF 1 MG/ML IJ SOLN
0.5000 mg | INTRAMUSCULAR | Status: DC | PRN
  Administered 2013-12-15: 1 mg via INTRAVENOUS
  Filled 2013-12-15: qty 1

## 2013-12-15 MED ORDER — ALPRAZOLAM 0.25 MG PO TABS
0.2500 mg | ORAL_TABLET | Freq: Every day | ORAL | Status: DC | PRN
  Administered 2013-12-18 – 2013-12-19 (×2): 0.25 mg via ORAL
  Filled 2013-12-15 (×3): qty 1

## 2013-12-15 MED ORDER — SODIUM CHLORIDE 0.9 % IV SOLN: INTRAVENOUS | Status: DC

## 2013-12-15 MED ORDER — LATANOPROST 0.005 % OP SOLN
1.0000 [drp] | Freq: Every day | OPHTHALMIC | Status: DC
  Administered 2013-12-15 – 2013-12-18 (×4): 1 [drp] via OPHTHALMIC
  Filled 2013-12-15: qty 2.5

## 2013-12-15 MED ORDER — LINAGLIPTIN 5 MG PO TABS
5.0000 mg | ORAL_TABLET | Freq: Every day | ORAL | Status: DC
  Administered 2013-12-16 – 2013-12-19 (×4): 5 mg via ORAL
  Filled 2013-12-15 (×5): qty 1

## 2013-12-15 MED ORDER — OXYCODONE-ACETAMINOPHEN 5-325 MG PO TABS
ORAL_TABLET | ORAL | Status: AC
  Filled 2013-12-15: qty 2

## 2013-12-15 MED ORDER — DEXTROSE 50 % IV SOLN
25.0000 mL | Freq: Once | INTRAVENOUS | Status: AC
  Administered 2013-12-15: 25 mL via INTRAVENOUS

## 2013-12-15 MED ORDER — ATORVASTATIN CALCIUM 10 MG PO TABS
10.0000 mg | ORAL_TABLET | Freq: Every day | ORAL | Status: DC
  Administered 2013-12-15 – 2013-12-17 (×3): 10 mg via ORAL
  Filled 2013-12-15 (×5): qty 1

## 2013-12-15 MED ORDER — OXYCODONE-ACETAMINOPHEN 5-325 MG PO TABS
1.0000 | ORAL_TABLET | ORAL | Status: DC | PRN
  Administered 2013-12-15 – 2013-12-16 (×5): 2 via ORAL
  Administered 2013-12-16: 1 via ORAL
  Administered 2013-12-16: 2 via ORAL
  Administered 2013-12-16: 1 via ORAL
  Administered 2013-12-17 – 2013-12-18 (×5): 2 via ORAL
  Administered 2013-12-18: 1 via ORAL
  Administered 2013-12-19: 2 via ORAL
  Filled 2013-12-15 (×3): qty 2
  Filled 2013-12-15: qty 1
  Filled 2013-12-15 (×5): qty 2
  Filled 2013-12-15: qty 1
  Filled 2013-12-15 (×2): qty 2
  Filled 2013-12-15: qty 1
  Filled 2013-12-15: qty 2

## 2013-12-15 MED ORDER — PROPOFOL 10 MG/ML IV BOLUS
INTRAVENOUS | Status: DC | PRN
  Administered 2013-12-15: 150 mg via INTRAVENOUS

## 2013-12-15 MED ORDER — ONDANSETRON HCL 4 MG PO TABS: 4.0000 mg | ORAL_TABLET | Freq: Four times a day (QID) | ORAL | Status: DC | PRN

## 2013-12-15 MED ORDER — INSULIN ASPART 100 UNIT/ML ~~LOC~~ SOLN
3.0000 [IU] | Freq: Three times a day (TID) | SUBCUTANEOUS | Status: DC
  Administered 2013-12-16 – 2013-12-17 (×2): 3 [IU] via SUBCUTANEOUS

## 2013-12-15 MED ORDER — GABAPENTIN 300 MG PO CAPS
600.0000 mg | ORAL_CAPSULE | Freq: Every day | ORAL | Status: DC
  Administered 2013-12-15 – 2013-12-18 (×4): 600 mg via ORAL
  Filled 2013-12-15 (×5): qty 2

## 2013-12-15 MED ORDER — GLIMEPIRIDE 4 MG PO TABS
4.0000 mg | ORAL_TABLET | Freq: Every day | ORAL | Status: DC
  Administered 2013-12-16 – 2013-12-19 (×4): 4 mg via ORAL
  Filled 2013-12-15 (×5): qty 1

## 2013-12-15 MED ORDER — CEFAZOLIN SODIUM 1-5 GM-% IV SOLN
1.0000 g | Freq: Four times a day (QID) | INTRAVENOUS | Status: AC
  Administered 2013-12-15 – 2013-12-16 (×3): 1 g via INTRAVENOUS
  Filled 2013-12-15 (×3): qty 50

## 2013-12-15 MED ORDER — ONDANSETRON HCL 4 MG/2ML IJ SOLN
INTRAMUSCULAR | Status: AC
  Filled 2013-12-15: qty 2

## 2013-12-15 MED ORDER — PROPOFOL 10 MG/ML IV BOLUS
INTRAVENOUS | Status: AC
Start: 2013-12-15 — End: 2013-12-15
  Filled 2013-12-15: qty 20

## 2013-12-15 MED ORDER — LIDOCAINE HCL (CARDIAC) 20 MG/ML IV SOLN
INTRAVENOUS | Status: AC
  Filled 2013-12-15: qty 5

## 2013-12-15 MED ORDER — INSULIN GLARGINE 100 UNIT/ML ~~LOC~~ SOLN
30.0000 [IU] | Freq: Every day | SUBCUTANEOUS | Status: DC
  Administered 2013-12-15 – 2013-12-18 (×3): 30 [IU] via SUBCUTANEOUS
  Filled 2013-12-15 (×5): qty 0.3

## 2013-12-15 MED ORDER — METOCLOPRAMIDE HCL 5 MG/ML IJ SOLN: 5.0000 mg | Freq: Three times a day (TID) | INTRAMUSCULAR | Status: DC | PRN

## 2013-12-15 MED ORDER — FENTANYL CITRATE 0.05 MG/ML IJ SOLN
25.0000 ug | INTRAMUSCULAR | Status: DC | PRN
  Administered 2013-12-15 (×4): 25 ug via INTRAVENOUS

## 2013-12-15 MED ORDER — MIDAZOLAM HCL 5 MG/5ML IJ SOLN
INTRAMUSCULAR | Status: DC | PRN
  Administered 2013-12-15: 2 mg via INTRAVENOUS

## 2013-12-15 SURGICAL SUPPLY — 32 items
BANDAGE GAUZE ELAST BULKY 4 IN (GAUZE/BANDAGES/DRESSINGS) ×6 IMPLANT
BLADE SAW SGTL HD 18.5X60.5X1. (BLADE) ×3 IMPLANT
BLADE SURG 10 STRL SS (BLADE) IMPLANT
BNDG COHESIVE 4X5 TAN STRL (GAUZE/BANDAGES/DRESSINGS) ×6 IMPLANT
BNDG GAUZE ELAST 4 BULKY (GAUZE/BANDAGES/DRESSINGS) ×3 IMPLANT
COVER SURGICAL LIGHT HANDLE (MISCELLANEOUS) ×3 IMPLANT
DRAPE U-SHAPE 47X51 STRL (DRAPES) ×3 IMPLANT
DRSG ADAPTIC 3X8 NADH LF (GAUZE/BANDAGES/DRESSINGS) ×3 IMPLANT
DRSG PAD ABDOMINAL 8X10 ST (GAUZE/BANDAGES/DRESSINGS) ×3 IMPLANT
DURAPREP 26ML APPLICATOR (WOUND CARE) ×3 IMPLANT
ELECT REM PT RETURN 9FT ADLT (ELECTROSURGICAL) ×3
ELECTRODE REM PT RTRN 9FT ADLT (ELECTROSURGICAL) ×1 IMPLANT
GLOVE BIOGEL PI IND STRL 9 (GLOVE) ×1 IMPLANT
GLOVE BIOGEL PI INDICATOR 9 (GLOVE) ×2
GLOVE SURG ORTHO 9.0 STRL STRW (GLOVE) ×3 IMPLANT
GOWN PREVENTION PLUS XLARGE (GOWN DISPOSABLE) ×3 IMPLANT
GOWN SRG XL XLNG 56XLVL 4 (GOWN DISPOSABLE) ×2 IMPLANT
GOWN STRL NON-REIN XL XLG LVL4 (GOWN DISPOSABLE) ×4
KIT BASIN OR (CUSTOM PROCEDURE TRAY) ×3 IMPLANT
KIT ROOM TURNOVER OR (KITS) ×3 IMPLANT
NS IRRIG 1000ML POUR BTL (IV SOLUTION) ×3 IMPLANT
PACK ORTHO EXTREMITY (CUSTOM PROCEDURE TRAY) ×3 IMPLANT
PAD ABD 8X10 STRL (GAUZE/BANDAGES/DRESSINGS) ×3 IMPLANT
PAD ARMBOARD 7.5X6 YLW CONV (MISCELLANEOUS) ×6 IMPLANT
SPONGE GAUZE 4X4 12PLY (GAUZE/BANDAGES/DRESSINGS) ×3 IMPLANT
SPONGE GAUZE 4X4 12PLY STER LF (GAUZE/BANDAGES/DRESSINGS) ×3 IMPLANT
SPONGE LAP 18X18 X RAY DECT (DISPOSABLE) ×3 IMPLANT
SUT ETHILON 2 0 PSLX (SUTURE) ×9 IMPLANT
SUT VIC AB 2-0 CTB1 (SUTURE) IMPLANT
TOWEL OR 17X24 6PK STRL BLUE (TOWEL DISPOSABLE) ×3 IMPLANT
TOWEL OR 17X26 10 PK STRL BLUE (TOWEL DISPOSABLE) ×3 IMPLANT
WATER STERILE IRR 1000ML POUR (IV SOLUTION) ×3 IMPLANT

## 2013-12-15 NOTE — Progress Notes (Signed)
Hypoglycemic Event Pt transported to floor at 1720. Checked CBG prior to eating: CBG: 41  Treatment: 15 GM carbohydrate snack  Symptoms: Hungry  Follow-up CBG: Time:1815 CBG Result:48 Given 15 gm carb snack plus a soda per patient request. Rechecked at 1830 CBG Result: 70  Possible Reasons for Event: Inadequate meal intake  Comments/MD notified: Initiated hypoglycemic protocol per policy. Pt had not had a meal since prior to midnight last night. She is eating snack and this time followed by first meal post op.     O'BRIEN, Shalisa Mcquade P  Remember to initiate Hypoglycemia Order Set & complete

## 2013-12-15 NOTE — Preoperative (Signed)
Beta Blockers   Reason not to administer Beta Blockers:Not Applicable 

## 2013-12-15 NOTE — Anesthesia Procedure Notes (Signed)
Procedure Name: LMA Insertion Date/Time: 12/15/2013 1:43 PM Performed by: Carola Frost Pre-anesthesia Checklist: Patient identified, Timeout performed, Emergency Drugs available, Suction available and Patient being monitored Patient Re-evaluated:Patient Re-evaluated prior to inductionOxygen Delivery Method: Circle system utilized Preoxygenation: Pre-oxygenation with 100% oxygen Ventilation: Mask ventilation without difficulty LMA: LMA inserted LMA Size: 4.0 Number of attempts: 1 Placement Confirmation: positive ETCO2 and breath sounds checked- equal and bilateral Tube secured with: Tape Dental Injury: Teeth and Oropharynx as per pre-operative assessment

## 2013-12-15 NOTE — Op Note (Signed)
OPERATIVE REPORT  DATE OF SURGERY: 12/15/2013  PATIENT:  Tara Hopkins,  63 y.o. female  PRE-OPERATIVE DIAGNOSIS:  Osteomyelitis Right 3rd Toe  POST-OPERATIVE DIAGNOSIS:  Osteomyelitis Right 3rd Toe  PROCEDURE:  Procedure(s): Right Transmetatarsal Amputation  SURGEON:  Surgeon(s): Newt Minion, MD  ANESTHESIA:   regional  EBL:  min ML  SPECIMEN:  Source of Specimen:  Right forefoot  TOURNIQUET:  * No tourniquets in log *  PROCEDURE DETAILS: Patient is a 63 year old woman diabetic insensate neuropathy status post first and second ray amputations who presents at this time with osteomyelitis abscess of the third toe. Patient presents for a transmetatarsal amputation. Risks and benefits were discussed including infection neurovascular injury nonhealing of the wound need for additional surgery. Patient states he understands was pursued this time. Description of procedure patient was brought to the operating room and underwent a general anesthetic. After adequate levels and anesthesia were obtained patient's right lower extremity was prepped using DuraPrep and draped into a sterile field. A fishmouth incision was made through the transmetatarsal region. The abscess from the third toe extended down into the midfoot. An oscillating saw was used to amputate the metatarsals with a gentle cascade. The abscess tissue was excised in one block of tissue. The wound was irrigated with normal saline. There was good petechial bleeding. The incision was closed in 2-0 nylon. The wound was covered with Adaptic orthopedic sponges ABDs dressing Kerlix and Coban. Patient was taken to the PACU in stable condition plan for overnight observation with IV antibiotics.  PLAN OF CARE: Admit for overnight observation  PATIENT DISPOSITION:  PACU - hemodynamically stable.   Newt Minion, MD 12/15/2013 2:08 PM

## 2013-12-15 NOTE — Anesthesia Preprocedure Evaluation (Addendum)
Anesthesia Evaluation  Patient identified by MRN, date of birth, ID band Patient awake    Reviewed: Allergy & Precautions, H&P , NPO status , Patient's Chart, lab work & pertinent test results  Airway Mallampati: II TM Distance: >3 FB Neck ROM: Full    Dental  (+) Dental Advisory Given and Poor Dentition   Pulmonary Current Smoker,          Cardiovascular hypertension, Pt. on medications     Neuro/Psych PSYCHIATRIC DISORDERS Anxiety Depression  Neuromuscular disease    GI/Hepatic   Endo/Other  diabetes, Insulin Dependent  Renal/GU      Musculoskeletal   Abdominal   Peds  Hematology  (+) anemia ,   Anesthesia Other Findings   Reproductive/Obstetrics                          Anesthesia Physical Anesthesia Plan  ASA: III  Anesthesia Plan: General   Post-op Pain Management:    Induction: Intravenous  Airway Management Planned: LMA  Additional Equipment:   Intra-op Plan:   Post-operative Plan:   Informed Consent: I have reviewed the patients History and Physical, chart, labs and discussed the procedure including the risks, benefits and alternatives for the proposed anesthesia with the patient or authorized representative who has indicated his/her understanding and acceptance.   Dental advisory given  Plan Discussed with: Anesthesiologist and Surgeon  Anesthesia Plan Comments:         Anesthesia Quick Evaluation

## 2013-12-15 NOTE — Progress Notes (Signed)
Orthopedic Tech Progress Note Patient Details:  Tara Hopkins Feb 16, 1951 [id number]  Ortho Devices Type of Ortho Device: Postop shoe/boot Ortho Device/Splint Location: rle Ortho Device/Splint Interventions: Application   Javarious Elsayed 12/15/2013, 5:34 PM

## 2013-12-15 NOTE — Transfer of Care (Signed)
Immediate Anesthesia Transfer of Care Note  [name]  Procedure(s) Performed: Procedure(s): Right Transmetatarsal Amputation (Right)  Patient Location: PACU  Anesthesia Type:General  Level of Consciousness: awake, alert  and oriented  Airway & Oxygen Therapy: Patient Spontanous Breathing and Patient connected to nasal cannula oxygen  Post-op Assessment: Report given to PACU RN, Post -op Vital signs reviewed and stable and Patient moving all extremities X 4  Post vital signs: Reviewed and stable  Complications: No apparent anesthesia complications

## 2013-12-15 NOTE — H&P (Signed)
Tara Hopkins is an 63 y.o. female.   Chief Complaint: Osteomyelitis ulceration right forefoot HPI: Patient is a 63 year old woman with diabetic insensate neuropathy status post previous amputation of the first and second ray right foot who presents with osteomyelitis of the third toe.  Past Medical History  Diagnosis Date  . Diabetes mellitus without complication   . Hypercholesteremia   . Peripheral neuropathy   . Hypertension     not on medications any longer  . Anxiety   . Depression   . History of blood transfusion     Past Surgical History  Procedure Laterality Date  . I&d extremity Right 05/23/2013    Procedure: IRRIGATION AND DEBRIDEMENT RIGHT GREAT TOE;  Surgeon: Mauri Pole, MD;  Location: [facility];  Service: Orthopedics;  Laterality: Right;    History reviewed. No pertinent family history. Social History:  reports that she has been smoking Cigarettes.  She has a .2 pack-year smoking history. She does not have any smokeless tobacco history on file. She reports that she does not drink alcohol or use illicit drugs.  Allergies: No Known Allergies  No prescriptions prior to admission    No results found for this or any previous visit (from the past 48 hour(s)). No results found.  Review of Systems  All other systems reviewed and are negative.    There were no vitals taken for this visit. Physical Exam  Radiographs shows distracted changes of the second metatarsal status post amputation of the first and second rays with most likely residual infection of the second metatarsal with osteomyelitis of the third toe. Patient has failed conservative wound care. Assessment/Plan Assessment: Status post first and second ray amputations right foot with possible persistent infection second metatarsal with osteomyelitis of the third toe.  Plan: Do to the persistent infection in the second metatarsal and osteomyelitis of the third toe will plan for a transmetatarsal amputation.  Risks and benefits were discussed including infection neurovascular injury need for additional surgery. Nonhealing of the wound. Patient states she understands and wished to proceed at this time.  Tara Hopkins V 12/15/2013, 6:43 AM

## 2013-12-15 NOTE — Anesthesia Postprocedure Evaluation (Signed)
  Anesthesia Post-op Note  [name]  Procedure(s) Performed: Procedure(s): Right Transmetatarsal Amputation (Right)  Patient Location: PACU  Anesthesia Type:General  Level of Consciousness: awake, alert  and oriented  Airway and Oxygen Therapy: Patient Spontanous Breathing and Patient connected to nasal cannula oxygen  Post-op Pain: moderate  Post-op Assessment: Post-op Vital signs reviewed  Post-op Vital Signs: Reviewed  Complications: No apparent anesthesia complications

## 2013-12-16 LAB — GLUCOSE, CAPILLARY
GLUCOSE-CAPILLARY: 108 mg/dL — AB (ref 70–99)
GLUCOSE-CAPILLARY: 85 mg/dL (ref 70–99)
GLUCOSE-CAPILLARY: 102 mg/dL — AB (ref 70–99)
Glucose-Capillary: 74 mg/dL (ref 70–99)
Glucose-Capillary: 98 mg/dL (ref 70–99)

## 2013-12-16 NOTE — Progress Notes (Signed)
Utilization Review completed.  

## 2013-12-16 NOTE — Progress Notes (Signed)
Pt stable  Dressing ok Dc today after PT

## 2013-12-16 NOTE — Evaluation (Signed)
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  Pt has history of R first and second ray amputations .    Clinical Impression  Pt. Presents to PT with a decrease in her functional mobility and gait status early fatigue for limited gait.  Pt. Verbalizes great concern that she cant manage at home alone and be able to maintain TDWB consistently for healing of the wound.  I am also concerned about the same and the fact that she  tires so quickly (about 10 ' before fatiguing).  She has attempted to manage at a wheelchair level in the past but feels she was not fully compliant with limited weight bearing status.  I believe this pt. Would be best served in a SNF enviornment for rehab and wound care.  Discussed this concern with pt;s RN Jiles Garter.  She plans to discuss these concerns with the social Development worker, community .    PT Assessment  Patient needs continued PT services    Follow Up Recommendations  SNF;Supervision/Assistance - 24 hour    Does the patient have the potential to tolerate intense rehabilitation      Barriers to Discharge Decreased caregiver support Pt. reports she lives alone with several friends who can help to transport her to hyperbaric treatments on M-F at [place].  She says daughter works and is not able to help much.  She expresses great concern that she cannot manage at TDWB level and in fact says that she couldn't do TDWB before and that her foot "busted open".      Equipment Recommendations  None recommended by PT    Recommendations for Other Services     Frequency Min 5X/week    Precautions / Restrictions Precautions Precautions: Fall Required Braces or Orthoses: Other Brace/Splint (has post  op shoe for right foot) Restrictions Weight Bearing Restrictions: Yes RLE Weight Bearing: Touchdown weight bearing   Pertinent Vitals/Pain See vitals tab       Mobility  Bed Mobility Overal bed mobility: Modified Independent General bed mobility comments: manages with bed rail Transfers Overall transfer level: Needs assistance Equipment used: Rolling walker (2 wheeled) Transfers: Sit to/from Stand Sit to Stand: Min assist General transfer comment: min assist for safety and balance support Ambulation/Gait Ambulation/Gait assistance: Min assist Ambulation Distance (Feet): 10 Feet (10 x 2 with seated rest) Assistive device: Rolling walker (2 wheeled) Gait Pattern/deviations: Step-to pattern (single leg hop to avoid >TDWB R LE) Gait velocity: decreased    Exercises     PT Diagnosis: Difficulty walking;Generalized weakness  PT Problem List: Decreased strength;Decreased activity tolerance;Decreased balance;Decreased mobility;Decreased knowledge of precautions PT Treatment Interventions: DME instruction;Gait training;Stair training;Functional mobility training;Therapeutic activities;Therapeutic exercise;Balance training;Patient/family education     PT Goals(Current goals can be found in the care plan section) Acute Rehab PT Goals Patient Stated Goal: rehab then home when foot heals PT Goal Formulation: With patient Time For Goal Achievement: 12/23/13 Potential to Achieve Goals: Fair  Visit Information  Last PT Received On: 12/16/13 Assistance Needed: +1 History of Present Illness: 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  Pt has history of R first and second ray amputations .         Prior Functioning  Home Living Family/patient expects to be discharged  to:: Private residence Living Arrangements: Alone Available Help at Discharge: Other (Comment) (daughter and pt's friends) Type of Home: Apartment Home Access: Stairs to  enter Technical brewer of Steps: 2 Entrance Stairs-Rails: Right Home Layout: One level Home Equipment: Wheelchair - [employer] - 2 wheels;Bedside commode;Shower seat;Cane - single point Prior Function Level of Independence: Independent with assistive device(s) (cane on PRN basis) Comments: cane on PRN basis Communication Communication: No difficulties    Cognition  Cognition Arousal/Alertness: Awake/alert Behavior During Therapy: WFL for tasks assessed/performed Overall Cognitive Status: Within Functional Limits for tasks assessed    Extremity/Trunk Assessment Upper Extremity Assessment Upper Extremity Assessment: Generalized weakness Lower Extremity Assessment Lower Extremity Assessment: Generalized weakness Cervical / Trunk Assessment Cervical / Trunk Assessment: Normal   Balance    End of Session PT - End of Session Equipment Utilized During Treatment: Gait belt;Other (comment) (R post op shoe) Activity Tolerance: Patient limited by fatigue Patient left: in chair;with call bell/phone within reach Nurse Communication: Mobility status;Other (comment) (pt. unable to manage at home alone )  GP Functional Assessment Tool Used: clinical observation/judgement Functional Limitation: Mobility: Walking and moving around Mobility: Walking and Moving Around Current Status (B5830): At least 40 percent but less than 60 percent impaired, limited or restricted Mobility: Walking and Moving Around Goal Status [phone]): At least 20 percent but less than 40 percent impaired, limited or restricted   Ladona Ridgel 12/16/2013, 4:02 PM Gerlean Ren PT Acute Rehab Services [phone] Bee[place] [phone]

## 2013-12-16 NOTE — Progress Notes (Signed)
Spoke with PT after session regarding patients discharge status. PT states that patient is not ready for discharge, needs more sessions with PT and recommends discharge to SNF for rehab. Spoke with patient regarding discharge and patient states she does not feel safe going home alone, and worries if she does she will "end up losing her whole foot". MD made aware.

## 2013-12-17 LAB — GLUCOSE, CAPILLARY
GLUCOSE-CAPILLARY: 83 mg/dL (ref 70–99)
Glucose-Capillary: 100 mg/dL — ABNORMAL HIGH (ref 70–99)
Glucose-Capillary: 130 mg/dL — ABNORMAL HIGH (ref 70–99)
Glucose-Capillary: 119 mg/dL — ABNORMAL HIGH (ref 70–99)

## 2013-12-17 NOTE — Progress Notes (Signed)
Pt stable Dressing ok PT rec snf for patient instead of dc home

## 2013-12-17 NOTE — Progress Notes (Signed)
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  Pt has history of R first and second ray amputations .     PT Comments   Pt somewhat tearful throughout session, stating that she is in her current situation because others did not care for her properly. Therapist provided emotional support and explained the importance of participating in therapy for her recovery and rehabilitation. After some ambulation, pt refused any more PT for the day. Will continue to follow.  Follow Up Recommendations  SNF;Supervision/Assistance - 24 hour     Does the patient have the potential to tolerate intense rehabilitation     Barriers to Discharge        Equipment Recommendations  None recommended by PT    Recommendations for Other Services    Frequency Min 5X/week   Progress towards PT Goals Progress towards PT goals: Progressing toward goals  Plan Current plan remains appropriate    Precautions / Restrictions Precautions Precautions: Fall Required Braces or Orthoses: Other Brace/Splint (has post op shoe for right foot) Restrictions Weight Bearing Restrictions: Yes RLE Weight Bearing: Touchdown weight bearing   Pertinent Vitals/Pain 8/10 at rest. Pt states she had pain medication about an hour prior to session. During mobility, pt did not appear to be limited by pain.     Mobility  Bed Mobility Overal bed mobility: Modified Independent General bed mobility comments: Able to come to full sit on EOB with use of bed rail and HOB elevated. Transfers Overall transfer level: Needs assistance Equipment used: Rolling walker (2 wheeled) Transfers: Sit to/from Stand Sit to Stand: Supervision General transfer comment: VC's for  hand placement on seated surface. Pt performs transfers very fast and with decreased safety awareness. Ambulation/Gait Ambulation/Gait assistance: Min guard Ambulation Distance (Feet): 40 Feet (15'; seated rest break; 25 feet) Assistive device: Rolling walker (2 wheeled) Gait Pattern/deviations: Step-to pattern (SL hop to avoid >TDWB RLE) Gait velocity: decreased Gait velocity interpretation: Below normal speed for age/gender General Gait Details: Cues to slow down and focus on technique. Pt kept herself NWB in order to not break TDWB status on RLE.    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  Pt has history of R first and second ray amputations .      Subjective Data  Subjective: "I just don't feel like doing this right now." Patient Stated Goal: rehab then home when foot heals   Cognition  Cognition Arousal/Alertness: Awake/alert Behavior During Therapy: WFL for tasks assessed/performed Overall Cognitive Status: Within Functional Limits for tasks assessed    Balance  Balance Overall balance assessment: Needs assistance Sitting-balance support: Feet supported;Bilateral upper extremity supported Sitting balance-Leahy Scale: Good Standing balance support: Bilateral upper extremity supported Standing balance-Leahy Scale: Fair  End of Session PT - End of Session Equipment Utilized  During Treatment: Gait belt Activity Tolerance: Patient limited by fatigue Patient left: in chair;with call bell/phone within  reach Nurse Communication: Mobility status   GP     Jolyn Lent 12/17/2013, 4:14 PM  Jolyn Lent, [place], DPT [phone]

## 2013-12-17 NOTE — Progress Notes (Signed)
Clinical Social Work Department BRIEF PSYCHOSOCIAL ASSESSMENT 12/17/2013  Patient:  Tara Hopkins, Tara Hopkins     Account Number:  [id number]     Admit date:  12/15/2013  Clinical Social Worker:  Rolinda Roan  Date/Time:  12/17/2013 11:51 AM  Referred by:  Physician  Date Referred:  12/16/2013 Referred for  SNF Placement   Other Referral:   Interview type:  Patient Other interview type:    PSYCHOSOCIAL DATA Living Status:  ALONE Admitted from facility:   Level of care:   Primary support name:  Venetia Night [phone] Primary support relationship to patient:  CHILD, ADULT Degree of support available:   Very supportive per patient.    CURRENT CONCERNS  Other Concerns:    SOCIAL WORK ASSESSMENT / PLAN Clinical Social Worker (CSW) met with patient to discuss D/C plan. Patient reported that she would like to go to a rehab facility because she knows from previous surgeries how hard it was going home. Patient reported that she had to get this surgery because she could not manage at home. Patient is agreeable to SNF search in [facility] and prefers [facility] because she has been there before. CSW explained to patient that with her insurance she might have a limited number of facilities to choose from. Patient verbalized her understanding and reported that she really needs to go a SNF right now.   Assessment/plan status:  Psychosocial Support/Ongoing Assessment of Needs Other assessment/ plan:   Information/referral to community resources:   CSW gave patient SNF list.    PATIENT'S/FAMILY'S RESPONSE TO PLAN OF CARE: Patient thanked CSW for visit and reported that she understands her insurance is limited in SNF coverage but she still wants to go to a SNF. Patient asked CSW to call her daughter Colletta Maryland about D/C plan. CSW left message with Colletta Maryland.

## 2013-12-17 NOTE — Progress Notes (Addendum)
Clinical Social Work Department CLINICAL SOCIAL WORK PLACEMENT NOTE 12/17/2013  Patient:  Tara Hopkins, Tara Hopkins  Account Number:  [id number] [place] date:  12/15/2013  Clinical Social Worker:  Blima Rich, Latanya Presser  Date/time:  12/17/2013 11:59 AM  Clinical Social Work is seeking post-discharge placement for this patient at the following level of care:   [place]   (*CSW will update this form in Epic as items are completed)   12/17/2013  Patient/family provided with [facility] Department of Clinical Social Work's list of facilities offering this level of care within the geographic area requested by the patient (or if unable, by the patient's family).  12/17/2013  Patient/family informed of their freedom to choose among providers that offer the needed level of care, that participate in Medicare, Medicaid or managed care program needed by the patient, have an available bed and are willing to accept the patient.  12/17/2013  Patient/family informed of [facility]' ownership interest in [facility], as well as of the fact that they are under no obligation to receive care at this facility.  PASARR submitted to EDS on 05/25/2013 PASARR number received from EDS on 05/25/2013  FL2 transmitted to all facilities in geographic area requested by pt/family on  12/17/2013 FL2 transmitted to all facilities within larger geographic area on   Patient informed that his/her managed care company has contracts with or will negotiate with  certain facilities, including the following:     Patient/family informed of bed offers received:  12/18/13 Patient chooses bed at [facility] Physician recommends and patient chooses bed at    Patient to be transferred to  on  12/19/2013 Patient to be transferred to facility by Private Vehicle  The following physician request were entered in Epic:   Additional Comments: Patient had an existing PASARR.

## 2013-12-18 ENCOUNTER — Encounter (HOSPITAL_COMMUNITY): Payer: Self-pay | Admitting: Orthopedic Surgery

## 2013-12-18 LAB — GLUCOSE, CAPILLARY
GLUCOSE-CAPILLARY: 75 mg/dL (ref 70–99)
GLUCOSE-CAPILLARY: 93 mg/dL (ref 70–99)
Glucose-Capillary: 139 mg/dL — ABNORMAL HIGH (ref 70–99)
Glucose-Capillary: 81 mg/dL (ref 70–99)
Glucose-Capillary: 131 mg/dL — ABNORMAL HIGH (ref 70–99)

## 2013-12-18 MED ORDER — OXYCODONE-ACETAMINOPHEN 5-325 MG PO TABS: 1.0000 | ORAL_TABLET | ORAL | Status: DC | PRN

## 2013-12-18 NOTE — Discharge Summary (Signed)
Physician Discharge Summary  Patient ID: Tara Hopkins MRN: [id number] DOB/AGE: [birth year] 63 y.o.  Admit date: 12/15/2013 Discharge date: 12/18/2013  Admission Diagnoses: Osteomyelitis abscess right forefoot  Discharge Diagnoses: Osteomyelitis abscess right forefoot Active Problems:   Status post transmetatarsal amputation of right foot   Discharged Condition: stable  Hospital Course: Patient's hospital course was essentially unremarkable. She underwent a transmetatarsal amputation on the right. Postoperatively patient progressed slowly with therapy and was felt to benefit from discharge to skilled nursing facility.  Consults: None  Significant Diagnostic Studies: labs: Routine labs  Treatments: surgery: See operative note  Discharge Exam: Blood pressure 123/65, pulse 76, temperature 97.9 F (36.6 C), temperature source Oral, resp. rate 16, weight 70.171 kg (154 lb 11.2 oz), SpO2 98.00%. Incision/Wound: dressing clean dry and intact  Disposition: 06-Home-Health Care Svc  Discharge Orders   Future Appointments Provider Department Dept Phone   12/20/2013 2:15 PM Timoteo Gaul, [place] at [place]   Future Orders Complete By Expires   Call MD / Call 911  As directed    Comments:     If you experience chest pain or shortness of breath, CALL 911 and be transported to the hospital emergency room.  If you develope a fever above 101 F, pus (white drainage) or increased drainage or redness at the wound, or calf pain, call your surgeon's office.   Call MD / Call 911  As directed    Comments:     If you experience chest pain or shortness of breath, CALL 911 and be transported to the hospital emergency room.  If you develope a fever above 101 F, pus (white drainage) or increased drainage or redness at the wound, or calf pain, call your surgeon's office.   Call MD / Call 911  As directed    Comments:     If you experience chest pain or shortness of  breath, CALL 911 and be transported to the hospital emergency room.  If you develope a fever above 101 F, pus (white drainage) or increased drainage or redness at the wound, or calf pain, call your surgeon's office.   Constipation Prevention  As directed    Comments:     Drink plenty of fluids.  Prune juice may be helpful.  You may use a stool softener, such as Colace (over the counter) 100 mg twice a day.  Use MiraLax (over the counter) for constipation as needed.   Constipation Prevention  As directed    Comments:     Drink plenty of fluids.  Prune juice may be helpful.  You may use a stool softener, such as Colace (over the counter) 100 mg twice a day.  Use MiraLax (over the counter) for constipation as needed.   Constipation Prevention  As directed    Comments:     Drink plenty of fluids.  Prune juice may be helpful.  You may use a stool softener, such as Colace (over the counter) 100 mg twice a day.  Use MiraLax (over the counter) for constipation as needed.   Diet - low sodium heart healthy  As directed    Diet - low sodium heart healthy  As directed    Diet - low sodium heart healthy  As directed    Increase activity slowly as tolerated  As directed    Increase activity slowly as tolerated  As directed    Increase activity slowly as tolerated  As directed        Medication  List         ALPRAZolam 0.25 MG tablet  Commonly known as:  XANAX  Take 1 tablet (0.25 mg total) by mouth daily as needed.     clonazePAM 0.5 MG tablet  Commonly known as:  KLONOPIN  Take 0.5 mg by mouth at bedtime.     COD LIVER OIL PO  Take 1 capsule by mouth daily.     doxycycline 100 MG tablet  Commonly known as:  VIBRA-TABS  Take 100 mg by mouth 2 (two) times daily.     gabapentin 300 MG capsule  Commonly known as:  NEURONTIN  Take 600 mg by mouth at bedtime.     glimepiride 4 MG tablet  Commonly known as:  AMARYL  Take 4 mg by mouth daily before breakfast.     insulin aspart 100 UNIT/ML  injection  Commonly known as:  novoLOG  Inject 2-6 Units into the skin 3 (three) times daily with meals. Inject 2-6 Units into the skin 15 (fifteen) minutes before meals. Per sliding scale.     insulin glargine 100 UNIT/ML injection  Commonly known as:  LANTUS  Inject 30 Units into the skin at bedtime.     latanoprost 0.005 % ophthalmic solution  Commonly known as:  XALATAN  Place 1 drop into both eyes at bedtime.     lisinopril 5 MG tablet  Commonly known as:  PRINIVIL,ZESTRIL  Take 5 mg by mouth daily.     ONGLYZA 5 MG Tabs tablet  Generic drug:  saxagliptin HCl  Take 5 mg by mouth daily.     oxyCODONE-acetaminophen 5-325 MG per tablet  Commonly known as:  PERCOCET/ROXICET  Take 1-2 tablets by mouth every 4 (four) hours as needed for moderate pain.     rosuvastatin 10 MG tablet  Commonly known as:  CRESTOR  Take 10 mg by mouth daily.     zinc gluconate 50 MG tablet  Take 50 mg by mouth 2 (two) times daily.           Follow-up Information   Follow up with Jakaden Ouzts V, MD In 2 weeks.   Specialty:  Orthopedic Surgery   Contact information:   [place] [address] [phone]       Signed: Newt Minion 12/18/2013, 6:31 AM

## 2013-12-18 NOTE — Care Management Note (Signed)
CARE MANAGEMENT NOTE 12/18/2013  Patient:  Tara Hopkins, Tara Hopkins   Account Number:  [id number]  Date Initiated:  12/18/2013  Documentation initiated by:  Ricki Miller  Subjective/Objective Assessment:   63 yr old female s/p right transmetatarsal amputation.     Action/Plan:   Patient is for shortterm rehab at [facility]. Social worker is aware.   Anticipated DC Date:  12/18/2013   Anticipated DC Plan:  SKILLED NURSING FACILITY  In-house referral  Clinical Social Worker      DC Planning Services  CM consult      Choice offered to / List presented to:     [facility] arranged  NA        [place] arranged  NA      Status of service:  Completed, signed off Medicare Important Message given?   (If response is "NO", the following Medicare IM given date fields will be blank) Date Medicare IM given:   Date Additional Medicare IM given:    Discharge Disposition:  SKILLED NURSING FACILITY  Per UR Regulation:    If discussed at Long Length of Stay Meetings, dates discussed:    Comments:

## 2013-12-18 NOTE — Progress Notes (Signed)
Patient ID: Tara Hopkins, female   DOB: May 04, 1951, 63 y.o.   MRN: [id number] Dressing clean and dry patient is comfortable. Anticipate discharge to skilled nursing facility. Discharge summary completed and F. L2 was signed.

## 2013-12-18 NOTE — Discharge Instructions (Signed)
Touchdown weightbearing right foot. Start dressing changes in one week.

## 2013-12-19 LAB — GLUCOSE, CAPILLARY
GLUCOSE-CAPILLARY: 113 mg/dL — AB (ref 70–99)
GLUCOSE-CAPILLARY: 47 mg/dL — AB (ref 70–99)
GLUCOSE-CAPILLARY: 93 mg/dL (ref 70–99)

## 2013-12-19 NOTE — Progress Notes (Signed)
CBG=47 -patient asymptomatic -tokk 4 ounces juice and ate lunch - CBG = 93. Continue to monitor.

## 2013-12-19 NOTE — Progress Notes (Signed)
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  Pt has history of R first and second ray amputations .     PT Comments   During session, pt reports she is going home and no longer wishes to d/c to SNF. Pt demonstrates good safety awareness and ability to maintain TDWB for basic transfers and short distance ambulation, however is not able to maintain WB status during stair negotiation. Recommended to pt that she still consider SNF, and after session another staff member talked with her regarding STR.  Case management/CSW notified.  Follow Up Recommendations  SNF;Supervision/Assistance - 24 hour     Does the patient have the potential to tolerate intense rehabilitation     Barriers to Discharge        Equipment Recommendations  None recommended by PT    Recommendations for Other Services    Frequency Min 5X/week   Progress towards PT Goals Progress towards PT goals: Progressing toward goals  Plan Current plan remains appropriate    Precautions / Restrictions Precautions Precautions: Fall Required Braces or Orthoses: Other Brace/Splint Other Brace/Splint: Post-op shoe for R foot. Restrictions Weight Bearing Restrictions: Yes RLE Weight Bearing: Touchdown weight bearing   Pertinent Vitals/Pain Pt reports she has no pain throughout session.     Mobility  Bed Mobility General bed mobility comments: Pt received sitting in recliner upon arrival Transfers Overall transfer level: Needs assistance Equipment used: Rolling walker (2 wheeled) Transfers: Sit to/from Stand Sit to Stand: Supervision General transfer comment: VC's for hand placement on seated surface. Pt performs transfers very fast and with  decreased safety awareness. Ambulation/Gait Ambulation/Gait assistance: Min guard Ambulation Distance (Feet): 25 Feet Assistive device: Rolling walker (2 wheeled) Gait Pattern/deviations: Step-to pattern Gait velocity: decreased Gait velocity interpretation: Below normal speed for age/gender General Gait Details: Pt kept herself NWB to avoid >TDWB on R.  Stairs: Yes Stairs assistance: Min assist Stair Management: Two rails;Step to pattern Number of Stairs: 5 (x2) General stair comments: Pt unable to maintain TDWB status on R. Therapist with foot under pt's foot and significant amount of weight being put through R, even though pt reports she is not.     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  Pt has history of R first and second ray amputations .      Subjective Data  Subjective: "I just want to get back to my hyperbarics at home." Patient Stated Goal: To heal   Cognition  Cognition Arousal/Alertness: Awake/alert Behavior During Therapy: WFL for tasks assessed/performed Overall Cognitive Status: Within Functional Limits for tasks assessed    Balance  Balance Overall balance assessment: Needs assistance Sitting-balance support: Feet supported;Bilateral upper extremity supported Sitting balance-Leahy Scale: Good Standing balance support: Bilateral upper extremity supported Standing balance-Leahy Scale: Fair  End of Session PT - End  of Session Equipment Utilized During Treatment: Gait belt Activity Tolerance: Patient tolerated  treatment well Patient left: in chair;with call bell/phone within reach Nurse Communication: Mobility status   GP     Jolyn Lent 12/19/2013, 12:07 PM  Jolyn Lent, [place], DPT [phone]

## 2013-12-19 NOTE — Progress Notes (Signed)
Patient D/C in stable condition to [facility] via w/c with copy of chart and scripts -transpoted by family.

## 2013-12-19 NOTE — Progress Notes (Signed)
Clinical social worker assisted with patient discharge to skilled nursing facility, [facility].  CSW addressed all family questions and concerns. CSW copied chart and added all important documents. CSW also set up patient transportation with [employer]omatic [employer] operational officer. Clinical Social Worker will sign off for now as social work intervention is no longer needed.   Rhea Pink, MSW, [place]

## 2013-12-20 ENCOUNTER — Ambulatory Visit: Payer: BC Managed Care – PPO | Admitting: Family

## 2013-12-20 DIAGNOSIS — E1169 Type 2 diabetes mellitus with other specified complication: Secondary | ICD-10-CM | POA: Diagnosis not present

## 2013-12-21 DIAGNOSIS — E1169 Type 2 diabetes mellitus with other specified complication: Secondary | ICD-10-CM | POA: Diagnosis not present

## 2013-12-21 LAB — GLUCOSE, CAPILLARY
GLUCOSE-CAPILLARY: 186 mg/dL — AB (ref 70–99)
GLUCOSE-CAPILLARY: 199 mg/dL — AB (ref 70–99)
Glucose-Capillary: 111 mg/dL — ABNORMAL HIGH (ref 70–99)
Glucose-Capillary: 162 mg/dL — ABNORMAL HIGH (ref 70–99)

## 2013-12-22 DIAGNOSIS — E1169 Type 2 diabetes mellitus with other specified complication: Secondary | ICD-10-CM | POA: Diagnosis not present

## 2013-12-22 LAB — GLUCOSE, CAPILLARY
Glucose-Capillary: 146 mg/dL — ABNORMAL HIGH (ref 70–99)
Glucose-Capillary: 111 mg/dL — ABNORMAL HIGH (ref 70–99)

## 2013-12-25 DIAGNOSIS — E1169 Type 2 diabetes mellitus with other specified complication: Secondary | ICD-10-CM | POA: Diagnosis not present

## 2013-12-25 LAB — GLUCOSE, CAPILLARY
GLUCOSE-CAPILLARY: 145 mg/dL — AB (ref 70–99)
GLUCOSE-CAPILLARY: 171 mg/dL — AB (ref 70–99)

## 2013-12-28 DIAGNOSIS — E1169 Type 2 diabetes mellitus with other specified complication: Secondary | ICD-10-CM | POA: Diagnosis not present

## 2013-12-28 LAB — GLUCOSE, CAPILLARY
GLUCOSE-CAPILLARY: 168 mg/dL — AB (ref 70–99)
GLUCOSE-CAPILLARY: 97 mg/dL (ref 70–99)

## 2013-12-29 DIAGNOSIS — E1169 Type 2 diabetes mellitus with other specified complication: Secondary | ICD-10-CM | POA: Diagnosis not present

## 2013-12-29 LAB — GLUCOSE, CAPILLARY
GLUCOSE-CAPILLARY: 112 mg/dL — AB (ref 70–99)
Glucose-Capillary: 131 mg/dL — ABNORMAL HIGH (ref 70–99)

## 2014-01-01 DIAGNOSIS — E1169 Type 2 diabetes mellitus with other specified complication: Secondary | ICD-10-CM | POA: Diagnosis not present

## 2014-01-01 LAB — GLUCOSE, CAPILLARY
GLUCOSE-CAPILLARY: 129 mg/dL — AB (ref 70–99)
Glucose-Capillary: 144 mg/dL — ABNORMAL HIGH (ref 70–99)

## 2014-01-03 ENCOUNTER — Encounter (HOSPITAL_COMMUNITY): Payer: Self-pay | Admitting: Emergency Medicine

## 2014-01-03 ENCOUNTER — Emergency Department (INDEPENDENT_AMBULATORY_CARE_PROVIDER_SITE_OTHER)
Admission: EM | Admit: 2014-01-03 | Discharge: 2014-01-03 | Disposition: A | Discharge status: Home / Self Care | Payer: BC Managed Care – PPO | Source: Home / Self Care | Attending: Family Medicine | Admitting: Family Medicine

## 2014-01-03 DIAGNOSIS — W57XXXA Bitten or stung by nonvenomous insect and other nonvenomous arthropods, initial encounter: Secondary | ICD-10-CM

## 2014-01-03 DIAGNOSIS — T148 Other injury of unspecified body region: Secondary | ICD-10-CM

## 2014-01-03 MED ORDER — TRIAMCINOLONE ACETONIDE 0.1 % EX CREA: 1.0000 "application " | TOPICAL_CREAM | Freq: Two times a day (BID) | CUTANEOUS | Status: DC

## 2014-01-03 NOTE — Discharge Instructions (Signed)
Thank you for coming in today. Use the cream twice daily as needed.  Take benadryl at bed time for itching as needed . Use gold bond itch for temporary control of itching.

## 2014-01-03 NOTE — ED Provider Notes (Signed)
Tara Hopkins is a 63 y.o. female who presents to Urgent Care today for bed bug bites. Patient spent the night at her friend's house 3 days ago. The next day she developed multiple itchy papules across her arms and face. This is consistent with prior episodes of bed bugs. She has not tried any treatment. No fevers chills nausea vomiting or diarrhea. He threw away all of her clothing that she was using at her friend's house.   Past Medical History  Diagnosis Date  . Diabetes mellitus without complication   . Hypercholesteremia   . Peripheral neuropathy   . Hypertension     not on medications any longer  . Anxiety   . Depression   . History of blood transfusion    History  Substance Use Topics  . Smoking status: Current Every Day Smoker -- 0.01 packs/day for 20 years    Types: Cigarettes  . Smokeless tobacco: Never Used  . Alcohol Use: No   ROS as above Medications: No current facility-administered medications for this encounter.   Current Outpatient Prescriptions  Medication Sig Dispense Refill  . ALPRAZolam (XANAX) 0.25 MG tablet Take 1 tablet (0.25 mg total) by mouth daily as needed.  30 tablet  2  . clonazePAM (KLONOPIN) 0.5 MG tablet Take 0.5 mg by mouth at bedtime.       . COD LIVER OIL PO Take 1 capsule by mouth daily.      Marland Kitchen gabapentin (NEURONTIN) 300 MG capsule Take 600 mg by mouth at bedtime.      Marland Kitchen glimepiride (AMARYL) 4 MG tablet Take 4 mg by mouth daily before breakfast.      . insulin aspart (NOVOLOG) 100 UNIT/ML injection Inject 2-6 Units into the skin 3 (three) times daily with meals. Inject 2-6 Units into the skin 15 (fifteen) minutes before meals. Per sliding scale.      . insulin glargine (LANTUS) 100 UNIT/ML injection Inject 30 Units into the skin at bedtime.       Marland Kitchen latanoprost (XALATAN) 0.005 % ophthalmic solution Place 1 drop into both eyes at bedtime.      Marland Kitchen lisinopril (PRINIVIL,ZESTRIL) 5 MG tablet Take 5 mg by mouth daily.      Marland Kitchen oxyCODONE-acetaminophen  (PERCOCET/ROXICET) 5-325 MG per tablet Take 1-2 tablets by mouth every 4 (four) hours as needed for moderate pain.  30 tablet  0  . rosuvastatin (CRESTOR) 10 MG tablet Take 10 mg by mouth daily.      . saxagliptin HCl (ONGLYZA) 5 MG TABS tablet Take 5 mg by mouth daily.      Marland Kitchen triamcinolone cream (KENALOG) 0.1 % Apply 1 application topically 2 (two) times daily.  30 g  1  . zinc gluconate 50 MG tablet Take 50 mg by mouth 2 (two) times daily.        Exam:  BP 187/93  Pulse 107  Temp(Src) 97.9 F (36.6 C) (Oral)  Resp 18  SpO2 98% Gen: Well NAD Skin: Multiple excoriated papules across both forearms and face  Assessment and Plan: 63 y.o. female with bed bug bite. Plan to treat with Benadryl, Gold Bond Itch, and triamcinolone cream. Followup with primary care provider. Handout provided.  Discussed warning signs or symptoms. Please see discharge instructions. Patient expresses understanding.    Gregor Hams, MD 01/03/14 [phone]

## 2014-01-03 NOTE — ED Notes (Signed)
Patient states spent one night at a friends house Has multiple bug bites on arms and torso and some bites to her face

## 2014-01-08 ENCOUNTER — Encounter (HOSPITAL_BASED_OUTPATIENT_CLINIC_OR_DEPARTMENT_OTHER): Payer: BC Managed Care – PPO | Attending: General Surgery

## 2014-01-08 DIAGNOSIS — E1169 Type 2 diabetes mellitus with other specified complication: Secondary | ICD-10-CM | POA: Insufficient documentation

## 2014-01-08 DIAGNOSIS — T8789 Other complications of amputation stump: Secondary | ICD-10-CM | POA: Insufficient documentation

## 2014-01-08 DIAGNOSIS — M86679 Other chronic osteomyelitis, unspecified ankle and foot: Secondary | ICD-10-CM | POA: Insufficient documentation

## 2014-01-08 DIAGNOSIS — S98919A Complete traumatic amputation of unspecified foot, level unspecified, initial encounter: Secondary | ICD-10-CM | POA: Insufficient documentation

## 2014-01-08 DIAGNOSIS — L97509 Non-pressure chronic ulcer of other part of unspecified foot with unspecified severity: Secondary | ICD-10-CM | POA: Insufficient documentation

## 2014-01-08 DIAGNOSIS — Y835 Amputation of limb(s) as the cause of abnormal reaction of the patient, or of later complication, without mention of misadventure at the time of the procedure: Secondary | ICD-10-CM | POA: Insufficient documentation

## 2014-01-09 DIAGNOSIS — E1169 Type 2 diabetes mellitus with other specified complication: Secondary | ICD-10-CM | POA: Diagnosis not present

## 2014-01-09 DIAGNOSIS — S98919A Complete traumatic amputation of unspecified foot, level unspecified, initial encounter: Secondary | ICD-10-CM | POA: Diagnosis not present

## 2014-01-09 DIAGNOSIS — Y835 Amputation of limb(s) as the cause of abnormal reaction of the patient, or of later complication, without mention of misadventure at the time of the procedure: Secondary | ICD-10-CM | POA: Diagnosis not present

## 2014-01-09 DIAGNOSIS — M86679 Other chronic osteomyelitis, unspecified ankle and foot: Secondary | ICD-10-CM | POA: Diagnosis not present

## 2014-01-09 DIAGNOSIS — L97509 Non-pressure chronic ulcer of other part of unspecified foot with unspecified severity: Secondary | ICD-10-CM | POA: Diagnosis not present

## 2014-01-09 DIAGNOSIS — T8789 Other complications of amputation stump: Secondary | ICD-10-CM | POA: Diagnosis not present

## 2014-01-09 LAB — GLUCOSE, CAPILLARY: Glucose-Capillary: 84 mg/dL (ref 70–99)

## 2014-01-10 DIAGNOSIS — T8789 Other complications of amputation stump: Secondary | ICD-10-CM | POA: Diagnosis not present

## 2014-01-10 LAB — GLUCOSE, CAPILLARY
GLUCOSE-CAPILLARY: 127 mg/dL — AB (ref 70–99)
Glucose-Capillary: 94 mg/dL (ref 70–99)

## 2014-01-11 DIAGNOSIS — T8789 Other complications of amputation stump: Secondary | ICD-10-CM | POA: Diagnosis not present

## 2014-01-12 DIAGNOSIS — T8789 Other complications of amputation stump: Secondary | ICD-10-CM | POA: Diagnosis not present

## 2014-01-12 LAB — GLUCOSE, CAPILLARY
GLUCOSE-CAPILLARY: 132 mg/dL — AB (ref 70–99)
Glucose-Capillary: 81 mg/dL (ref 70–99)
Glucose-Capillary: 79 mg/dL (ref 70–99)
Glucose-Capillary: 172 mg/dL — ABNORMAL HIGH (ref 70–99)

## 2014-01-15 DIAGNOSIS — T8789 Other complications of amputation stump: Secondary | ICD-10-CM | POA: Diagnosis not present

## 2014-01-15 LAB — GLUCOSE, CAPILLARY
GLUCOSE-CAPILLARY: 117 mg/dL — AB (ref 70–99)
Glucose-Capillary: 154 mg/dL — ABNORMAL HIGH (ref 70–99)

## 2014-01-16 DIAGNOSIS — T8789 Other complications of amputation stump: Secondary | ICD-10-CM | POA: Diagnosis not present

## 2014-01-17 DIAGNOSIS — T8789 Other complications of amputation stump: Secondary | ICD-10-CM | POA: Diagnosis not present

## 2014-01-18 DIAGNOSIS — T8789 Other complications of amputation stump: Secondary | ICD-10-CM | POA: Diagnosis not present

## 2014-01-19 DIAGNOSIS — T8789 Other complications of amputation stump: Secondary | ICD-10-CM | POA: Diagnosis not present

## 2014-01-19 LAB — GLUCOSE, CAPILLARY
GLUCOSE-CAPILLARY: 135 mg/dL — AB (ref 70–99)
GLUCOSE-CAPILLARY: 194 mg/dL — AB (ref 70–99)
Glucose-Capillary: 143 mg/dL — ABNORMAL HIGH (ref 70–99)
Glucose-Capillary: 120 mg/dL — ABNORMAL HIGH (ref 70–99)
Glucose-Capillary: 142 mg/dL — ABNORMAL HIGH (ref 70–99)
Glucose-Capillary: 139 mg/dL — ABNORMAL HIGH (ref 70–99)
Glucose-Capillary: 128 mg/dL — ABNORMAL HIGH (ref 70–99)
Glucose-Capillary: 144 mg/dL — ABNORMAL HIGH (ref 70–99)

## 2014-01-22 DIAGNOSIS — T8789 Other complications of amputation stump: Secondary | ICD-10-CM | POA: Diagnosis not present

## 2014-01-22 LAB — GLUCOSE, CAPILLARY
GLUCOSE-CAPILLARY: 160 mg/dL — AB (ref 70–99)
Glucose-Capillary: 140 mg/dL — ABNORMAL HIGH (ref 70–99)

## 2014-01-23 DIAGNOSIS — T8789 Other complications of amputation stump: Secondary | ICD-10-CM | POA: Diagnosis not present

## 2014-01-23 LAB — GLUCOSE, CAPILLARY
GLUCOSE-CAPILLARY: 164 mg/dL — AB (ref 70–99)
GLUCOSE-CAPILLARY: 136 mg/dL — AB (ref 70–99)

## 2014-01-24 ENCOUNTER — Telehealth: Payer: Self-pay | Admitting: Family

## 2014-01-24 DIAGNOSIS — T8789 Other complications of amputation stump: Secondary | ICD-10-CM | POA: Diagnosis not present

## 2014-01-24 LAB — GLUCOSE, CAPILLARY
Glucose-Capillary: 131 mg/dL — ABNORMAL HIGH (ref 70–99)
Glucose-Capillary: 145 mg/dL — ABNORMAL HIGH (ref 70–99)

## 2014-01-24 NOTE — Telephone Encounter (Signed)
error 

## 2014-01-31 DIAGNOSIS — T8789 Other complications of amputation stump: Secondary | ICD-10-CM | POA: Diagnosis not present

## 2014-02-07 ENCOUNTER — Ambulatory Visit: Payer: BC Managed Care – PPO | Admitting: Family

## 2014-02-07 ENCOUNTER — Encounter (HOSPITAL_BASED_OUTPATIENT_CLINIC_OR_DEPARTMENT_OTHER): Payer: BC Managed Care – PPO | Attending: General Surgery

## 2014-02-07 DIAGNOSIS — S98919A Complete traumatic amputation of unspecified foot, level unspecified, initial encounter: Secondary | ICD-10-CM | POA: Insufficient documentation

## 2014-02-07 DIAGNOSIS — Y835 Amputation of limb(s) as the cause of abnormal reaction of the patient, or of later complication, without mention of misadventure at the time of the procedure: Secondary | ICD-10-CM | POA: Insufficient documentation

## 2014-02-07 DIAGNOSIS — T8189XA Other complications of procedures, not elsewhere classified, initial encounter: Secondary | ICD-10-CM | POA: Insufficient documentation

## 2014-02-08 ENCOUNTER — Ambulatory Visit: Payer: BC Managed Care – PPO | Admitting: Family

## 2014-02-16 ENCOUNTER — Encounter: Payer: Self-pay | Admitting: Family

## 2014-02-16 ENCOUNTER — Ambulatory Visit (INDEPENDENT_AMBULATORY_CARE_PROVIDER_SITE_OTHER): Payer: BC Managed Care – PPO | Admitting: Family

## 2014-02-16 ENCOUNTER — Telehealth: Payer: Self-pay | Admitting: Family

## 2014-02-16 VITALS — BP 124/84 | HR 83 | Ht 65.5 in | Wt 155.0 lb

## 2014-02-16 DIAGNOSIS — I10 Essential (primary) hypertension: Secondary | ICD-10-CM

## 2014-02-16 DIAGNOSIS — IMO0001 Reserved for inherently not codable concepts without codable children: Secondary | ICD-10-CM

## 2014-02-16 DIAGNOSIS — E1165 Type 2 diabetes mellitus with hyperglycemia: Secondary | ICD-10-CM

## 2014-02-16 DIAGNOSIS — E7800 Pure hypercholesterolemia, unspecified: Secondary | ICD-10-CM

## 2014-02-16 DIAGNOSIS — F411 Generalized anxiety disorder: Secondary | ICD-10-CM

## 2014-02-16 LAB — BASIC METABOLIC PANEL
BUN: 20 mg/dL (ref 6–23)
CALCIUM: 9.5 mg/dL (ref 8.4–10.5)
CO2: 28 mEq/L (ref 19–32)
Chloride: 103 mEq/L (ref 96–112)
Creatinine, Ser: 1.3 mg/dL — ABNORMAL HIGH (ref 0.4–1.2)
GFR: 52.5 mL/min — ABNORMAL LOW (ref >60.00)
GLUCOSE: 58 mg/dL — AB (ref 70–99)
Potassium: 3.5 mEq/L (ref 3.5–5.1)
SODIUM: 140 meq/L (ref 135–145)

## 2014-02-16 LAB — LIPID PANEL
Cholesterol: 145 mg/dL (ref 0–200)
HDL: 51 mg/dL (ref >39.00)
LDL Cholesterol: 73 mg/dL (ref 0–99)
Total CHOL/HDL Ratio: 3
Triglycerides: 107 mg/dL (ref 0.0–149.0)
VLDL: 21.4 mg/dL (ref 0.0–40.0)

## 2014-02-16 LAB — HEPATIC FUNCTION PANEL
ALBUMIN: 3.5 g/dL (ref 3.5–5.2)
ALK PHOS: 92 U/L (ref 39–117)
ALT: 13 U/L (ref 0–35)
AST: 16 U/L (ref 0–37)
Bilirubin, Direct: 0 mg/dL (ref 0.0–0.3)
TOTAL PROTEIN: 6.9 g/dL (ref 6.0–8.3)
Total Bilirubin: 0.6 mg/dL (ref 0.3–1.2)

## 2014-02-16 LAB — HEMOGLOBIN A1C: Hgb A1c MFr Bld: 7 % — ABNORMAL HIGH (ref 4.6–6.5)

## 2014-02-16 NOTE — Patient Instructions (Signed)
Diabetes and Sick Day Management Blood sugar (glucose) can be more difficult to control when you are sick. Colds, fever, flu, nausea, vomiting, and diarrhea are all examples of common illnesses that can cause problems for people with diabetes. Loss of body fluids (dehydration) from fever, vomiting, diarrhea, infection, and the stress of a sickness can all cause blood glucose levels to increase. Because of this, it is very important to take your diabetes medicines and to eat some form of carbohydrate food when you are sick. Liquid or soft foods are often tolerated, and they help to replace fluids. HOME CARE INSTRUCTIONS These main guidelines are intended for managing a short-term (24 hours or less) sickness:  Take your usual dose of insulin or oral diabetes medicine. An exception would be if you take any form of metformin. If you cannot eat or drink, you can become dehydrated and should not take this medicine.  Continue to take your insulin even if you are unable to eat solid foods or are vomiting. Your insulin dose may stay the same, or it may need to be increased when you are sick.  You will need to test your blood glucose more often, generally every 2 4 hours. If you have type 1 diabetes, test your urine for ketones every 4 hours. If you have type 2 diabetes, test your urine for ketones as directed by your health care provider.  Eat some form of food that contains carbohydrates. The carbohydrates can be in solid or liquid form. You should eat 45 50 g of carbohydrates every 3 4 hours.  Replace fluids if you have a fever, vomit, or have diarrhea. Ask your health care provider for specific rehydration instructions.  Watch carefully for the signs of ketoacidosis if you have type 1 diabetes. Call your health care provider if any of the following symptoms are present, especially in children:  Moderate to large ketones in the urine along with a high blood glucose level.  Severe  nausea.  Vomiting.  Diarrhea.  Abdominal pain.  Rapid breathing.  Drink extra liquids that do not contain sugar, such as water or sugar-free liquids, or caffeine.  Be careful with over-the-counter medicines. Read the labels. They may contain sugar or types of sugars that can increase your blood glucose level. Food Choices for Illness All of the food choices below contain about 15 g of carbohydrates. Plan ahead and keep some of these foods around.    to  cup carbonated beverage containing sugar. Carbonated beverages will usually be better tolerated if they are opened and left at room temperature for a few minutes.   of a twin frozen ice pop.   cup regular gelatin.   cup juice.   cup ice cream or frozen yogurt.   cup cooked cereal.   cup sherbet.  1 cup clear broth or soup.  1 cup cream soup.   cup regular custard.   cup regular pudding.  1 cup sports drink.  1 cup plain yogurt.  1 slice toast.  6 squares saltine crackers.  5 vanilla wafers.   cup sports drink. SEEK MEDICAL CARE IF:   You are unable to drink fluids, even small amounts.  You have nausea and vomiting for more than 6 hours.  You have diarrhea for more than 6 hours.  Your blood glucose level is more than 240 mg/dL, even with additional insulin.  There is a change in mental status.  You develop an additional serious sickness.  You have been sick for 2  days and are not getting better.  You have had a fever for 2 days. SEEK IMMEDIATE MEDICAL CARE IF:  You have difficulty breathing.  You have moderate to large ketone levels. MAKE SURE YOU:  Understand these instructions.  Will watch your condition.  Will get help right away if you are not doing well or get worse. Document Released: 10/29/2003 Document Revised: 06/28/2013 Document Reviewed: 04/04/2013 [facility] Patient Information 2014 [place].

## 2014-02-16 NOTE — Telephone Encounter (Signed)
Relevant patient education assigned to patient using Emmi. ° °

## 2014-02-16 NOTE — Progress Notes (Signed)
Pre visit review using our clinic review tool, if applicable. No additional management support is needed unless otherwise documented below in the visit note. 

## 2014-02-16 NOTE — Progress Notes (Signed)
Subjective:    Patient ID: Tara Hopkins, female    DOB: [birth year], 63 y.o.   MRN: [id number]  HPI  63 year old Tara Hopkins female, with history of type 2 diabetes-uncontrolled, hypertension, depression, hypokalemia, and renal insufficiency is in today for recheck. He is status post transmetatarsal amputation of the right foot. She is currently seeing the wound care clinic. Smokes intermittently. Fasting blood sugars around 120 to 130.  Review of Systems  Constitutional: Negative.   HENT: Negative.   Respiratory: Negative.   Cardiovascular: Negative.   Gastrointestinal: Negative.   Endocrine: Negative.   Genitourinary: Negative.   Musculoskeletal: Negative.   Neurological: Negative.   Psychiatric/Behavioral: Negative.    Past Medical History  Diagnosis Date  . Diabetes mellitus without complication   . Hypercholesteremia   . Peripheral neuropathy   . Hypertension     not on medications any longer  . Anxiety   . Depression   . History of blood transfusion     History   Social History  . Marital Status: Married    Spouse Name: N/A    Number of Children: N/A  . Years of Education: N/A   Occupational History  . Not on file.   Social History Main Topics  . Smoking status: Current Every Day Smoker -- 0.01 packs/day for 20 years    Types: Cigarettes  . Smokeless tobacco: Never Used  . Alcohol Use: No  . Drug Use: No  . Sexual Activity: Not on file   Other Topics Concern  . Not on file   Social History Narrative  . No narrative on file    Past Surgical History  Procedure Laterality Date  . I&d extremity Right 05/23/2013    Procedure: IRRIGATION AND DEBRIDEMENT RIGHT GREAT TOE;  Surgeon: Mauri Pole, MD;  Location: [facility];  Service: Orthopedics;  Laterality: Right;  . Amputation Right 12/15/2013    Procedure: Right Transmetatarsal Amputation;  Surgeon: Newt Minion, MD;  Location: [place] [facility];  Service: Orthopedics;  Laterality: Right;    No family  history on file.  No Known Allergies  Current Outpatient Prescriptions on File Prior to Visit  Medication Sig Dispense Refill  . ALPRAZolam (XANAX) 0.25 MG tablet Take 1 tablet (0.25 mg total) by mouth daily as needed.  30 tablet  2  . clonazePAM (KLONOPIN) 0.5 MG tablet Take 0.5 mg by mouth at bedtime.       . COD LIVER OIL PO Take 1 capsule by mouth daily.      Marland Kitchen gabapentin (NEURONTIN) 300 MG capsule Take 600 mg by mouth at bedtime.      Marland Kitchen glimepiride (AMARYL) 4 MG tablet Take 4 mg by mouth daily before breakfast.      . insulin aspart (NOVOLOG) 100 UNIT/ML injection Inject 2-6 Units into the skin 3 (three) times daily with meals. Inject 2-6 Units into the skin 15 (fifteen) minutes before meals. Per sliding scale.      . insulin glargine (LANTUS) 100 UNIT/ML injection Inject 30 Units into the skin at bedtime.       Marland Kitchen latanoprost (XALATAN) 0.005 % ophthalmic solution Place 1 drop into both eyes at bedtime.      Marland Kitchen lisinopril (PRINIVIL,ZESTRIL) 5 MG tablet Take 5 mg by mouth daily.      Marland Kitchen oxyCODONE-acetaminophen (PERCOCET/ROXICET) 5-325 MG per tablet Take 1-2 tablets by mouth every 4 (four) hours as needed for moderate pain.  30 tablet  0  . rosuvastatin (CRESTOR) 10 MG tablet Take  10 mg by mouth daily.      . saxagliptin HCl (ONGLYZA) 5 MG TABS tablet Take 5 mg by mouth daily.      Marland Kitchen triamcinolone cream (KENALOG) 0.1 % Apply 1 application topically 2 (two) times daily.  30 g  1  . zinc gluconate 50 MG tablet Take 50 mg by mouth 2 (two) times daily.       No current facility-administered medications on file prior to visit.    BP 124/84  Pulse 83  Ht 5' 5.5" (1.664 m)  Wt 155 lb (70.308 kg)  BMI 25.39 kg/m2chart    Objective:   Physical Exam  Constitutional: She is oriented to person, place, and time. She appears well-developed and well-nourished.  HENT:  Right Ear: External ear normal.  Left Ear: External ear normal.  Nose: Nose normal.  Mouth/Throat: Oropharynx is clear and  moist.  Neck: Normal range of motion. Neck supple.  Cardiovascular: Normal rate, regular rhythm and normal heart sounds.   Pulmonary/Chest: Effort normal and breath sounds normal.  Abdominal: Soft. Bowel sounds are normal.  Musculoskeletal: Normal range of motion.  Neurological: She is alert and oriented to person, place, and time.  Skin: Skin is warm and dry.  Psychiatric: She has a normal mood and affect.          Assessment & Plan:  Tara Hopkins was seen today for follow-up.  Diagnoses and associated orders for this visit:  Unspecified essential hypertension - Hemoglobin J6B - Basic Metabolic Panel - Hepatic Function Panel - Lipid Panel  Type II or unspecified type diabetes mellitus without mention of complication, uncontrolled - Hemoglobin H4L - Basic Metabolic Panel - Hepatic Function Panel - Lipid Panel  Anxiety state, unspecified - Hemoglobin P3X - Basic Metabolic Panel - Hepatic Function Panel - Lipid Panel  Pure hypercholesterolemia - Hemoglobin T0W - Basic Metabolic Panel - Hepatic Function Panel - Lipid Panel   Call the office with any questions or concerns. Recheck in 3 months and sooner as needed.

## 2014-02-19 ENCOUNTER — Other Ambulatory Visit (HOSPITAL_COMMUNITY): Payer: Self-pay | Admitting: Orthopedic Surgery

## 2014-02-19 ENCOUNTER — Encounter (HOSPITAL_COMMUNITY): Payer: Self-pay | Admitting: Pharmacy Technician

## 2014-02-22 ENCOUNTER — Encounter (HOSPITAL_COMMUNITY)
Admission: RE | Admit: 2014-02-22 | Discharge: 2014-02-22 | Disposition: A | Discharge status: Home / Self Care | Payer: BC Managed Care – PPO | Source: Ambulatory Visit | Attending: Orthopedic Surgery | Admitting: Orthopedic Surgery

## 2014-02-22 ENCOUNTER — Encounter (HOSPITAL_COMMUNITY): Payer: Self-pay

## 2014-02-22 HISTORY — DX: Anemia, unspecified: D64.9

## 2014-02-22 HISTORY — DX: Personal history of other medical treatment: Z92.89

## 2014-02-22 LAB — CBC
HEMATOCRIT: 32.8 % — AB (ref 36.0–46.0)
Hemoglobin: 10.3 g/dL — ABNORMAL LOW (ref 12.0–15.0)
MCH: 27.2 pg (ref 26.0–34.0)
MCHC: 31.4 g/dL (ref 30.0–36.0)
MCV: 86.5 fL (ref 78.0–100.0)
PLATELETS: 403 10*3/uL — AB (ref 150–400)
RBC: 3.79 MIL/uL — AB (ref 3.87–5.11)
RDW: 15 % (ref 11.5–15.5)
WBC: 10.1 10*3/uL (ref 4.0–10.5)

## 2014-02-22 MED ORDER — CEFAZOLIN SODIUM-DEXTROSE 2-3 GM-% IV SOLR
2.0000 g | INTRAVENOUS | Status: AC
  Administered 2014-02-23: 2 g via INTRAVENOUS
  Filled 2014-02-22: qty 50

## 2014-02-22 NOTE — Pre-Procedure Instructions (Signed)
Kayslee Furey  02/22/2014   Your procedure is scheduled on:  Friday, April 17th  Report to Admitting at 115 PM.  Call this number if you have problems the morning of surgery: [phone]   Remember:   Do not eat food or drink liquids after midnight.   Take these medicines the morning of surgery with A SIP OF WATER: none  Do NOT take any diabetes medication or insulin on morning of surgery   Do not wear jewelry, make-up or nail polish.  Do not wear lotions, powders, or perfumes. You may wear deodorant.  Do not shave 48 hours prior to surgery. Men may shave face and neck.  Do not bring valuables to the hospital.  [facility] is not responsible for any belongings or valuables.               Contacts, dentures or bridgework may not be worn into surgery.  Leave suitcase in the car. After surgery it may be brought to your room.  For patients admitted to the hospital, discharge time is determined by your  treatment team.               Patients discharged the day of surgery will not be allowed to drive home.  Please read over the following fact sheets that you were given: Pain Booklet, Coughing and Deep Breathing and Surgical Site Infection Prevention [place] - Preparing for Surgery  Before surgery, you can play an important role.  Because skin is not sterile, your skin needs to be as free of germs as possible.  You can reduce the number of germs on you skin by washing with CHG (chlorahexidine gluconate) soap before surgery.  CHG is an antiseptic cleaner which kills germs and bonds with the skin to continue killing germs even after washing.  Please DO NOT use if you have an allergy to CHG or antibacterial soaps.  If your skin becomes reddened/irritated stop using the CHG and inform your nurse when you arrive at Short Stay.  Do not shave (including legs and underarms) for at least 48 hours prior to the first CHG shower.  You may shave your face.  Please follow these instructions  carefully:   1.  Shower with CHG Soap the night before surgery and the morning of Surgery.  2.  If you choose to wash your hair, wash your hair first as usual with your normal shampoo.  3.  After you shampoo, rinse your hair and body thoroughly to remove the shampoo.  4.  Use CHG as you would any other liquid soap.  You can apply CHG directly to the skin and wash gently with scrungie or a clean washcloth.  5.  Apply the CHG Soap to your body ONLY FROM THE NECK DOWN.  Do not use on open wounds or open sores.  Avoid contact with your eyes, ears, mouth and genitals (private parts).  Wash genitals (private parts) with your normal soap.  6.  Wash thoroughly, paying special attention to the area where your surgery will be performed.  7.  Thoroughly rinse your body with warm water from the neck down.  8.  DO NOT shower/wash with your normal soap after using and rinsing off the CHG Soap.  9.  Pat yourself dry with a clean towel.            10.  Wear clean pajamas.            11.  Place clean sheets on your  bed the night of your first shower and do not sleep with pets.  Day of Surgery  Do not apply any lotions/deoderants the morning of surgery.  Please wear clean clothes to the hospital/surgery center.

## 2014-02-23 ENCOUNTER — Encounter (HOSPITAL_COMMUNITY)
Admission: RE | Disposition: A | Discharge status: Home / Self Care | Payer: Self-pay | Source: Ambulatory Visit | Attending: Orthopedic Surgery

## 2014-02-23 ENCOUNTER — Encounter (HOSPITAL_COMMUNITY): Payer: Self-pay | Admitting: Anesthesiology

## 2014-02-23 ENCOUNTER — Encounter (HOSPITAL_COMMUNITY): Payer: BC Managed Care – PPO | Admitting: Anesthesiology

## 2014-02-23 ENCOUNTER — Ambulatory Visit (HOSPITAL_COMMUNITY)
Admission: RE | Admit: 2014-02-23 | Discharge: 2014-02-24 | Disposition: A | Discharge status: Home / Self Care | Payer: BC Managed Care – PPO | Source: Ambulatory Visit | Attending: Orthopedic Surgery | Admitting: Orthopedic Surgery

## 2014-02-23 ENCOUNTER — Ambulatory Visit (HOSPITAL_COMMUNITY): Payer: BC Managed Care – PPO | Admitting: Anesthesiology

## 2014-02-23 DIAGNOSIS — F329 Major depressive disorder, single episode, unspecified: Secondary | ICD-10-CM | POA: Insufficient documentation

## 2014-02-23 DIAGNOSIS — E1159 Type 2 diabetes mellitus with other circulatory complications: Secondary | ICD-10-CM | POA: Insufficient documentation

## 2014-02-23 DIAGNOSIS — Z794 Long term (current) use of insulin: Secondary | ICD-10-CM | POA: Insufficient documentation

## 2014-02-23 DIAGNOSIS — Y835 Amputation of limb(s) as the cause of abnormal reaction of the patient, or of later complication, without mention of misadventure at the time of the procedure: Secondary | ICD-10-CM | POA: Insufficient documentation

## 2014-02-23 DIAGNOSIS — E1142 Type 2 diabetes mellitus with diabetic polyneuropathy: Secondary | ICD-10-CM | POA: Insufficient documentation

## 2014-02-23 DIAGNOSIS — F411 Generalized anxiety disorder: Secondary | ICD-10-CM | POA: Insufficient documentation

## 2014-02-23 DIAGNOSIS — F17200 Nicotine dependence, unspecified, uncomplicated: Secondary | ICD-10-CM | POA: Insufficient documentation

## 2014-02-23 DIAGNOSIS — M86171 Other acute osteomyelitis, right ankle and foot: Secondary | ICD-10-CM | POA: Diagnosis present

## 2014-02-23 DIAGNOSIS — F3289 Other specified depressive episodes: Secondary | ICD-10-CM | POA: Insufficient documentation

## 2014-02-23 DIAGNOSIS — I10 Essential (primary) hypertension: Secondary | ICD-10-CM | POA: Insufficient documentation

## 2014-02-23 DIAGNOSIS — E1149 Type 2 diabetes mellitus with other diabetic neurological complication: Secondary | ICD-10-CM | POA: Insufficient documentation

## 2014-02-23 DIAGNOSIS — I96 Gangrene, not elsewhere classified: Secondary | ICD-10-CM | POA: Insufficient documentation

## 2014-02-23 DIAGNOSIS — T8789 Other complications of amputation stump: Secondary | ICD-10-CM | POA: Insufficient documentation

## 2014-02-23 HISTORY — PX: AMPUTATION: SHX166

## 2014-02-23 LAB — GLUCOSE, CAPILLARY
GLUCOSE-CAPILLARY: 214 mg/dL — AB (ref 70–99)
Glucose-Capillary: 86 mg/dL (ref 70–99)
Glucose-Capillary: 75 mg/dL (ref 70–99)
Glucose-Capillary: 62 mg/dL — ABNORMAL LOW (ref 70–99)
Glucose-Capillary: 74 mg/dL (ref 70–99)

## 2014-02-23 SURGERY — AMPUTATION, FOOT, PARTIAL
Anesthesia: General | Site: Foot | Laterality: Right

## 2014-02-23 MED ORDER — CLONAZEPAM 0.5 MG PO TABS
0.5000 mg | ORAL_TABLET | Freq: Every day | ORAL | Status: DC
  Administered 2014-02-23: 0.5 mg via ORAL
  Filled 2014-02-23: qty 1

## 2014-02-23 MED ORDER — INSULIN GLARGINE 100 UNIT/ML ~~LOC~~ SOLN
30.0000 [IU] | Freq: Every day | SUBCUTANEOUS | Status: DC
  Administered 2014-02-23: 30 [IU] via SUBCUTANEOUS
  Filled 2014-02-23 (×2): qty 0.3

## 2014-02-23 MED ORDER — GABAPENTIN 300 MG PO CAPS
600.0000 mg | ORAL_CAPSULE | Freq: Every day | ORAL | Status: DC
  Administered 2014-02-23: 600 mg via ORAL
  Filled 2014-02-23 (×2): qty 2

## 2014-02-23 MED ORDER — METOCLOPRAMIDE HCL 5 MG/ML IJ SOLN: 5.0000 mg | Freq: Three times a day (TID) | INTRAMUSCULAR | Status: DC | PRN

## 2014-02-23 MED ORDER — FENTANYL CITRATE 0.05 MG/ML IJ SOLN
INTRAMUSCULAR | Status: AC
  Filled 2014-02-23: qty 5

## 2014-02-23 MED ORDER — ONDANSETRON HCL 4 MG/2ML IJ SOLN: 4.0000 mg | Freq: Once | INTRAMUSCULAR | Status: DC | PRN

## 2014-02-23 MED ORDER — ONDANSETRON HCL 4 MG/2ML IJ SOLN
INTRAMUSCULAR | Status: AC
Start: 2014-02-23 — End: 2014-02-23
  Filled 2014-02-23: qty 2

## 2014-02-23 MED ORDER — ONDANSETRON HCL 4 MG/2ML IJ SOLN: 4.0000 mg | Freq: Four times a day (QID) | INTRAMUSCULAR | Status: DC | PRN

## 2014-02-23 MED ORDER — SODIUM CHLORIDE 0.9 % IV SOLN
INTRAVENOUS | Status: DC
  Administered 2014-02-23: 20 mL/h via INTRAVENOUS

## 2014-02-23 MED ORDER — NEOSTIGMINE METHYLSULFATE 1 MG/ML IJ SOLN
INTRAMUSCULAR | Status: AC
  Filled 2014-02-23: qty 10

## 2014-02-23 MED ORDER — LIDOCAINE HCL (CARDIAC) 20 MG/ML IV SOLN
INTRAVENOUS | Status: DC | PRN
  Administered 2014-02-23: 100 mg via INTRAVENOUS

## 2014-02-23 MED ORDER — LISINOPRIL 20 MG PO TABS
20.0000 mg | ORAL_TABLET | Freq: Two times a day (BID) | ORAL | Status: DC | PRN
  Filled 2014-02-23: qty 1

## 2014-02-23 MED ORDER — PROPOFOL 10 MG/ML IV BOLUS
INTRAVENOUS | Status: DC | PRN
  Administered 2014-02-23: 140 mg via INTRAVENOUS

## 2014-02-23 MED ORDER — ROCURONIUM BROMIDE 50 MG/5ML IV SOLN
INTRAVENOUS | Status: AC
  Filled 2014-02-23: qty 1

## 2014-02-23 MED ORDER — PROPOFOL 10 MG/ML IV BOLUS
INTRAVENOUS | Status: AC
  Filled 2014-02-23: qty 20

## 2014-02-23 MED ORDER — LATANOPROST 0.005 % OP SOLN
1.0000 [drp] | Freq: Every day | OPHTHALMIC | Status: DC
  Administered 2014-02-23: 1 [drp] via OPHTHALMIC
  Filled 2014-02-23: qty 2.5

## 2014-02-23 MED ORDER — CEFAZOLIN SODIUM 1-5 GM-% IV SOLN
1.0000 g | Freq: Four times a day (QID) | INTRAVENOUS | Status: AC
  Administered 2014-02-23 – 2014-02-24 (×3): 1 g via INTRAVENOUS
  Filled 2014-02-23 (×4): qty 50

## 2014-02-23 MED ORDER — INSULIN ASPART 100 UNIT/ML ~~LOC~~ SOLN: 4.0000 [IU] | Freq: Three times a day (TID) | SUBCUTANEOUS | Status: DC

## 2014-02-23 MED ORDER — METOCLOPRAMIDE HCL 10 MG PO TABS: 5.0000 mg | ORAL_TABLET | Freq: Three times a day (TID) | ORAL | Status: DC | PRN

## 2014-02-23 MED ORDER — GLYCOPYRROLATE 0.2 MG/ML IJ SOLN
INTRAMUSCULAR | Status: AC
  Filled 2014-02-23: qty 2

## 2014-02-23 MED ORDER — HYDROMORPHONE HCL PF 1 MG/ML IJ SOLN
INTRAMUSCULAR | Status: AC
Start: 2014-02-23 — End: 2014-02-24
  Filled 2014-02-23: qty 1

## 2014-02-23 MED ORDER — ATORVASTATIN CALCIUM 10 MG PO TABS
10.0000 mg | ORAL_TABLET | Freq: Every day | ORAL | Status: DC
  Filled 2014-02-23: qty 1

## 2014-02-23 MED ORDER — ASPIRIN EC 325 MG PO TBEC
325.0000 mg | DELAYED_RELEASE_TABLET | Freq: Every day | ORAL | Status: DC
  Administered 2014-02-24: 325 mg via ORAL
  Filled 2014-02-23: qty 1

## 2014-02-23 MED ORDER — ARTIFICIAL TEARS OP OINT
TOPICAL_OINTMENT | OPHTHALMIC | Status: DC | PRN
  Administered 2014-02-23: 1 via OPHTHALMIC

## 2014-02-23 MED ORDER — FENTANYL CITRATE 0.05 MG/ML IJ SOLN
INTRAMUSCULAR | Status: DC | PRN
  Administered 2014-02-23: 50 ug via INTRAVENOUS
  Administered 2014-02-23: 25 ug via INTRAVENOUS

## 2014-02-23 MED ORDER — ONDANSETRON HCL 4 MG PO TABS: 4.0000 mg | ORAL_TABLET | Freq: Four times a day (QID) | ORAL | Status: DC | PRN

## 2014-02-23 MED ORDER — OXYCODONE-ACETAMINOPHEN 5-325 MG PO TABS
1.0000 | ORAL_TABLET | ORAL | Status: DC | PRN
  Administered 2014-02-23 – 2014-02-24 (×2): 2 via ORAL
  Filled 2014-02-23 (×2): qty 2

## 2014-02-23 MED ORDER — HYDROMORPHONE HCL PF 1 MG/ML IJ SOLN
0.5000 mg | INTRAMUSCULAR | Status: DC | PRN
  Administered 2014-02-23 – 2014-02-24 (×3): 1 mg via INTRAVENOUS
  Filled 2014-02-23 (×3): qty 1

## 2014-02-23 MED ORDER — INSULIN ASPART 100 UNIT/ML ~~LOC~~ SOLN
0.0000 [IU] | Freq: Three times a day (TID) | SUBCUTANEOUS | Status: DC
  Administered 2014-02-24: 2 [IU] via SUBCUTANEOUS

## 2014-02-23 MED ORDER — ONDANSETRON HCL 4 MG/2ML IJ SOLN
INTRAMUSCULAR | Status: DC | PRN
  Administered 2014-02-23: 4 mg via INTRAVENOUS

## 2014-02-23 MED ORDER — HYDROMORPHONE HCL PF 1 MG/ML IJ SOLN
0.2500 mg | INTRAMUSCULAR | Status: DC | PRN
  Administered 2014-02-23 (×4): 0.5 mg via INTRAVENOUS

## 2014-02-23 MED ORDER — ARTIFICIAL TEARS OP OINT
TOPICAL_OINTMENT | OPHTHALMIC | Status: AC
  Filled 2014-02-23: qty 3.5

## 2014-02-23 MED ORDER — LACTATED RINGERS IV SOLN
INTRAVENOUS | Status: DC
  Administered 2014-02-23: 13:00:00 via INTRAVENOUS

## 2014-02-23 MED ORDER — GLIMEPIRIDE 4 MG PO TABS
4.0000 mg | ORAL_TABLET | Freq: Every day | ORAL | Status: DC
  Administered 2014-02-24: 4 mg via ORAL
  Filled 2014-02-23 (×2): qty 1

## 2014-02-23 MED ORDER — LACTATED RINGERS IV SOLN
INTRAVENOUS | Status: DC | PRN
  Administered 2014-02-23: 14:00:00 via INTRAVENOUS

## 2014-02-23 MED ORDER — 0.9 % SODIUM CHLORIDE (POUR BTL) OPTIME
TOPICAL | Status: DC | PRN
  Administered 2014-02-23: 1000 mL

## 2014-02-23 SURGICAL SUPPLY — 36 items
BANDAGE GAUZE ELAST BULKY 4 IN (GAUZE/BANDAGES/DRESSINGS) ×3 IMPLANT
BLADE SAW SGTL HD 18.5X60.5X1. (BLADE) ×3 IMPLANT
BLADE SURG 10 STRL SS (BLADE) IMPLANT
BLADE SURG 21 STRL SS (BLADE) ×3 IMPLANT
BNDG COHESIVE 4X5 TAN STRL (GAUZE/BANDAGES/DRESSINGS) ×3 IMPLANT
COVER SURGICAL LIGHT HANDLE (MISCELLANEOUS) ×3 IMPLANT
DRAPE U-SHAPE 47X51 STRL (DRAPES) ×3 IMPLANT
DRSG ADAPTIC 3X8 NADH LF (GAUZE/BANDAGES/DRESSINGS) ×3 IMPLANT
DRSG PAD ABDOMINAL 8X10 ST (GAUZE/BANDAGES/DRESSINGS) ×3 IMPLANT
DURAPREP 26ML APPLICATOR (WOUND CARE) ×3 IMPLANT
ELECT REM PT RETURN 9FT ADLT (ELECTROSURGICAL) ×3
ELECTRODE REM PT RTRN 9FT ADLT (ELECTROSURGICAL) ×1 IMPLANT
GLOVE BIOGEL PI IND STRL 6.5 (GLOVE) ×1 IMPLANT
GLOVE BIOGEL PI IND STRL 9 (GLOVE) ×1 IMPLANT
GLOVE BIOGEL PI INDICATOR 6.5 (GLOVE) ×2
GLOVE BIOGEL PI INDICATOR 9 (GLOVE) ×2
GLOVE SURG ORTHO 9.0 STRL STRW (GLOVE) ×3 IMPLANT
GLOVE SURG SS PI 6.5 STRL IVOR (GLOVE) ×3 IMPLANT
GLOVE SURG SS PI 7.0 STRL IVOR (GLOVE) ×3 IMPLANT
GLOVE SURG SS PI 8.0 STRL IVOR (GLOVE) ×6 IMPLANT
GOWN STRL REUS W/ TWL XL LVL3 (GOWN DISPOSABLE) ×3 IMPLANT
GOWN STRL REUS W/TWL XL LVL3 (GOWN DISPOSABLE) ×6
KIT BASIN OR (CUSTOM PROCEDURE TRAY) ×3 IMPLANT
KIT ROOM TURNOVER OR (KITS) ×3 IMPLANT
NS IRRIG 1000ML POUR BTL (IV SOLUTION) ×3 IMPLANT
PACK ORTHO EXTREMITY (CUSTOM PROCEDURE TRAY) ×3 IMPLANT
PAD ARMBOARD 7.5X6 YLW CONV (MISCELLANEOUS) ×3 IMPLANT
SHIELD ×3 IMPLANT
SPONGE GAUZE 4X4 12PLY (GAUZE/BANDAGES/DRESSINGS) IMPLANT
SPONGE GAUZE 4X4 12PLY STER LF (GAUZE/BANDAGES/DRESSINGS) ×3 IMPLANT
SPONGE LAP 18X18 X RAY DECT (DISPOSABLE) ×3 IMPLANT
SUT ETHILON 2 0 PSLX (SUTURE) ×3 IMPLANT
SUT VIC AB 2-0 CTB1 (SUTURE) IMPLANT
TOWEL OR 17X24 6PK STRL BLUE (TOWEL DISPOSABLE) ×3 IMPLANT
TOWEL OR 17X26 10 PK STRL BLUE (TOWEL DISPOSABLE) ×3 IMPLANT
WATER STERILE IRR 1000ML POUR (IV SOLUTION) IMPLANT

## 2014-02-23 NOTE — Transfer of Care (Signed)
Immediate Anesthesia Transfer of Care Note  [name]  Procedure(s) Performed: Procedure(s) with comments: AMPUTATION FOOT (Right) - Right Foot Revision Transmetatarsal Amputation  Patient Location: PACU  Anesthesia Type:General  Level of Consciousness: awake, alert  and oriented  Airway & Oxygen Therapy: Patient Spontanous Breathing and Patient connected to nasal cannula oxygen  Post-op Assessment: Report given to PACU RN  Post vital signs: Reviewed and stable  Complications: No apparent anesthesia complications

## 2014-02-23 NOTE — H&P (Signed)
Tara Hopkins is an 63 y.o. female.   Chief Complaint: Dehiscence right foot transmetatarsal amputation HPI: Patient is a 63 year old woman diabetic insensate neuropathy who is status post transmetatarsal amputation right foot who has had progressive dehiscence of the wound.  Past Medical History  Diagnosis Date  . Diabetes mellitus without complication   . Hypercholesteremia   . Peripheral neuropathy   . Hypertension     not on medications any longer  . Anxiety   . Depression   . History of blood transfusion   . History of hyperbaric oxygen therapy     pt reports 80 treatments.  . Anemia     Past Surgical History  Procedure Laterality Date  . I&d extremity Right 05/23/2013    Procedure: IRRIGATION AND DEBRIDEMENT RIGHT GREAT TOE;  Surgeon: Mauri Pole, MD;  Location: [facility];  Service: Orthopedics;  Laterality: Right;  . Amputation Right 12/15/2013    Procedure: Right Transmetatarsal Amputation;  Surgeon: Newt Minion, MD;  Location: [place];  Service: Orthopedics;  Laterality: Right;    No family history on file. Social History:  reports that she has been smoking Cigarettes.  She has a .2 pack-year smoking history. She has never used smokeless tobacco. She reports that she does not drink alcohol or use illicit drugs.  Allergies: No Known Allergies  No prescriptions prior to admission    Results for orders placed during the hospital encounter of 02/22/14 (from the past 48 hour(s))  CBC     Status: Abnormal   Collection Time    02/22/14  2:11 PM      Result Value Ref Range   WBC 10.1  4.0 - 10.5 K/uL   RBC 3.79 (*) 3.87 - 5.11 MIL/uL   Hemoglobin 10.3 (*) 12.0 - 15.0 g/dL   HCT 32.8 (*) 36.0 - 46.0 %   MCV 86.5  78.0 - 100.0 fL   MCH 27.2  26.0 - 34.0 pg   MCHC 31.4  30.0 - 36.0 g/dL   RDW 15.0  11.5 - 15.5 %   Platelets 403 (*) 150 - 400 K/uL   No results found.  Review of Systems  All other systems reviewed and are negative.   There were no vitals taken for  this visit. Physical Exam  On examination patient has a palpable pulse. She has ulceration of the transmetatarsal amputation with exposed bone. Assessment/Plan Assessment: Dehiscence right transmetatarsal amputation.  Plan: Patient wishes to proceed with foot salvage intervention. We'll plan for revision of the transmetatarsal amputation. Risks and benefits were discussed patient states she understands and wished to proceed at this time.  Newt Minion 02/23/2014, 6:19 AM

## 2014-02-23 NOTE — Anesthesia Preprocedure Evaluation (Addendum)
Anesthesia Evaluation  Patient identified by MRN, date of birth, ID band Patient awake    Reviewed: Allergy & Precautions, H&P , NPO status , Patient's Chart, lab work & pertinent test results, reviewed documented beta blocker date and time   Airway Mallampati: II TM Distance: >3 FB     Dental  (+) Edentulous Upper, Partial Lower   Pulmonary Current Smoker,    Pulmonary exam normal       Cardiovascular hypertension, + Peripheral Vascular Disease     Neuro/Psych PSYCHIATRIC DISORDERS Anxiety Depression    GI/Hepatic   Endo/Other  diabetes, Type 2, Insulin Dependent  Renal/GU Renal disease     Musculoskeletal   Abdominal Normal abdominal exam  (+)   Peds  Hematology  (+) anemia ,   Anesthesia Other Findings   Reproductive/Obstetrics                          Anesthesia Physical Anesthesia Plan  ASA: III  Anesthesia Plan: General   Post-op Pain Management:    Induction: Intravenous  Airway Management Planned: LMA and Oral ETT  Additional Equipment:   Intra-op Plan:   Post-operative Plan: Extubation in OR  Informed Consent: I have reviewed the patients History and Physical, chart, labs and discussed the procedure including the risks, benefits and alternatives for the proposed anesthesia with the patient or authorized representative who has indicated his/her understanding and acceptance.   Dental advisory given  Plan Discussed with: CRNA, Anesthesiologist and Surgeon  Anesthesia Plan Comments:         Anesthesia Quick Evaluation

## 2014-02-23 NOTE — Progress Notes (Signed)
Hypoglycemic Event  CBG: 62  Treatment: 15 GM carbohydrate snack  Symptoms: None  Follow-up CBG: Time:1652 CBG Result:74  Possible Reasons for Event: Inadequate meal intake post surgery  Comments/MD notified:     Daneen Schick  Remember to initiate Hypoglycemia Order Set & complete

## 2014-02-23 NOTE — Op Note (Signed)
OPERATIVE REPORT  DATE OF SURGERY: 02/23/2014  PATIENT:  Tara Hopkins,  63 y.o. female  PRE-OPERATIVE DIAGNOSIS:  Osteomyelitis Right 5th MT  POST-OPERATIVE DIAGNOSIS:  Osteomyelitis Right 5th MT  PROCEDURE:  Procedure(s): AMPUTATION FOOT revision transmetatarsal amputation  SURGEON:  Surgeon(s): Newt Minion, MD  ANESTHESIA:   general  EBL:  Minimal ML  SPECIMEN:  No Specimen  TOURNIQUET:  * No tourniquets in log *  PROCEDURE DETAILS: Patient is a 63 year old woman who is status post transmetatarsal amputation who presents at this time with osteomyelitis of the fifth metatarsal. She has had wound breakdown and dehiscence and presents for revision amputation. Risks and benefits were discussed including infection neurovascular injury nonhealing of the wound need for additional surgery. Patient states she understands and wished to proceed at this time. Description of procedure patient was brought to the operating room and underwent a general anesthetic. After adequate levels and anesthesia obtained patient's right lower extremity was prepped using DuraPrep draped into a sterile field. A timeout was called. A fishmouth incision was made around the necrotic tissue. The metatarsals were resected with oscillating saw. Hemostasis was obtained. The incision was closed using 2-0 nylon. The wound was covered with Adaptic orthopedic sponges AB dressing Kerlix and Coban. Patient was extubated taken to the PACU in stable condition.  PLAN OF CARE: Admit to inpatient   PATIENT DISPOSITION:  PACU - hemodynamically stable.   Newt Minion, MD 02/23/2014 5:32 PM

## 2014-02-23 NOTE — Anesthesia Postprocedure Evaluation (Signed)
  Anesthesia Post-op Note  [name]  Procedure(s) Performed: Procedure(s) with comments: AMPUTATION FOOT (Right) - Right Foot Revision Transmetatarsal Amputation  Patient Location: PACU  Anesthesia Type:General  Level of Consciousness: awake, alert , oriented and patient cooperative  Airway and Oxygen Therapy: Patient Spontanous Breathing  Post-op Pain: mild  Post-op Assessment: Post-op Vital signs reviewed, Patient's Cardiovascular Status Stable, Respiratory Function Stable, Patent Airway and No signs of Nausea or vomiting  Post-op Vital Signs: stable  Last Vitals:  Filed Vitals:   02/23/14 1530  BP:   Pulse: 69  Temp:   Resp: 18    Complications: No apparent anesthesia complications

## 2014-02-23 NOTE — Progress Notes (Signed)
Orthopedic Tech Progress Note Patient Details:  Tara Hopkins 11-16-1950 [id number]  Ortho Devices Type of Ortho Device: Postop shoe/boot Ortho Device/Splint Location: rle Ortho Device/Splint Interventions: Application   [name] 02/23/2014, 4:55 PM

## 2014-02-24 ENCOUNTER — Encounter (HOSPITAL_COMMUNITY): Payer: Self-pay | Admitting: *Deleted

## 2014-02-24 LAB — GLUCOSE, CAPILLARY
GLUCOSE-CAPILLARY: 121 mg/dL — AB (ref 70–99)
Glucose-Capillary: 108 mg/dL — ABNORMAL HIGH (ref 70–99)

## 2014-02-24 NOTE — Discharge Summary (Signed)
  Discharge to home in stable condition. Touchdown weightbearing on the right with the fracture boot in place for ambulation. No prescriptions for discharge. Followup in the office in one week. Final diagnosis gangrene right transmetatarsal amputation. Surgical procedure Chopart amputation right foot.

## 2014-02-24 NOTE — Progress Notes (Signed)
Orthopedic Tech Progress Note Patient Details:  Tara Hopkins 08/12/1951 [id number]  Ortho Devices Type of Ortho Device: CAM walker Ortho Device/Splint Location: rle Ortho Device/Splint Interventions: Application   Theodoro Parma Cammer 02/24/2014, 9:58 AM

## 2014-02-26 ENCOUNTER — Encounter (HOSPITAL_COMMUNITY): Payer: Self-pay | Admitting: Orthopedic Surgery

## 2014-02-27 ENCOUNTER — Telehealth: Payer: Self-pay

## 2014-02-27 ENCOUNTER — Other Ambulatory Visit: Payer: Self-pay | Admitting: Family

## 2014-02-27 NOTE — Telephone Encounter (Signed)
Relevant patient education assigned to patient using Emmi. ° °

## 2014-03-12 ENCOUNTER — Encounter (HOSPITAL_BASED_OUTPATIENT_CLINIC_OR_DEPARTMENT_OTHER): Payer: BC Managed Care – PPO | Attending: Plastic Surgery

## 2014-03-12 ENCOUNTER — Encounter: Payer: Self-pay | Admitting: Family

## 2014-03-12 DIAGNOSIS — T85898A Other specified complication of other internal prosthetic devices, implants and grafts, initial encounter: Secondary | ICD-10-CM | POA: Insufficient documentation

## 2014-03-12 DIAGNOSIS — S98139A Complete traumatic amputation of one unspecified lesser toe, initial encounter: Secondary | ICD-10-CM | POA: Insufficient documentation

## 2014-03-12 DIAGNOSIS — Y835 Amputation of limb(s) as the cause of abnormal reaction of the patient, or of later complication, without mention of misadventure at the time of the procedure: Secondary | ICD-10-CM | POA: Insufficient documentation

## 2014-03-12 DIAGNOSIS — Y831 Surgical operation with implant of artificial internal device as the cause of abnormal reaction of the patient, or of later complication, without mention of misadventure at the time of the procedure: Secondary | ICD-10-CM | POA: Insufficient documentation

## 2014-03-12 DIAGNOSIS — S98119A Complete traumatic amputation of unspecified great toe, initial encounter: Secondary | ICD-10-CM | POA: Insufficient documentation

## 2014-03-12 DIAGNOSIS — E109 Type 1 diabetes mellitus without complications: Secondary | ICD-10-CM | POA: Insufficient documentation

## 2014-03-12 DIAGNOSIS — T8789 Other complications of amputation stump: Secondary | ICD-10-CM | POA: Insufficient documentation

## 2014-03-14 DIAGNOSIS — Y831 Surgical operation with implant of artificial internal device as the cause of abnormal reaction of the patient, or of later complication, without mention of misadventure at the time of the procedure: Secondary | ICD-10-CM | POA: Diagnosis not present

## 2014-03-14 DIAGNOSIS — E109 Type 1 diabetes mellitus without complications: Secondary | ICD-10-CM | POA: Diagnosis not present

## 2014-03-14 DIAGNOSIS — S98119A Complete traumatic amputation of unspecified great toe, initial encounter: Secondary | ICD-10-CM | POA: Diagnosis not present

## 2014-03-14 DIAGNOSIS — T85898A Other specified complication of other internal prosthetic devices, implants and grafts, initial encounter: Secondary | ICD-10-CM | POA: Diagnosis not present

## 2014-03-14 DIAGNOSIS — S98139A Complete traumatic amputation of one unspecified lesser toe, initial encounter: Secondary | ICD-10-CM | POA: Diagnosis not present

## 2014-03-14 DIAGNOSIS — Y835 Amputation of limb(s) as the cause of abnormal reaction of the patient, or of later complication, without mention of misadventure at the time of the procedure: Secondary | ICD-10-CM | POA: Diagnosis not present

## 2014-03-14 DIAGNOSIS — T8789 Other complications of amputation stump: Secondary | ICD-10-CM | POA: Diagnosis not present

## 2014-03-17 NOTE — Progress Notes (Signed)
[facility]  Tara Hopkins, Tara Hopkins            ACCOUNT NO.:  [id number]  MEDICAL RECORD NO.:  [id number]      DATE OF BIRTH:  Jul 05, 1951  PHYSICIAN:  Judene Companion, M.D.           VISIT DATE:                                  OFFICE VISIT   [name] is a 63 year old African American female who has type 1 diabetes and has already lost her first and second toes at one operative procedure where the flaps failed and she later had to have a transmetatarsal amputation.  She has been treated with antibiotics and hyperbaric oxygen treatments.  The reason why she had to have the second operation is because she developed some osteomyelitis in transmetatarsal.  She now is having difficulty healing the flaps under transmetatarsal amputation, and I would like very much to continue her hyperbaric oxygen treatments in order to try to save this foot from failing to which she had adequate healing.     Judene Companion, M.D.     PP/MEDQ  D:  03/16/2014  T:  03/17/2014  Job:  [id number]

## 2014-03-21 DIAGNOSIS — T8789 Other complications of amputation stump: Secondary | ICD-10-CM | POA: Diagnosis not present

## 2014-03-26 DIAGNOSIS — T8789 Other complications of amputation stump: Secondary | ICD-10-CM | POA: Diagnosis not present

## 2014-03-26 LAB — GLUCOSE, CAPILLARY
GLUCOSE-CAPILLARY: 253 mg/dL — AB (ref 70–99)
GLUCOSE-CAPILLARY: 157 mg/dL — AB (ref 70–99)

## 2014-03-27 DIAGNOSIS — T8789 Other complications of amputation stump: Secondary | ICD-10-CM | POA: Diagnosis not present

## 2014-03-27 LAB — GLUCOSE, CAPILLARY
Glucose-Capillary: 142 mg/dL — ABNORMAL HIGH (ref 70–99)
Glucose-Capillary: 147 mg/dL — ABNORMAL HIGH (ref 70–99)

## 2014-03-28 ENCOUNTER — Other Ambulatory Visit: Payer: Self-pay | Admitting: Family

## 2014-03-28 DIAGNOSIS — T8789 Other complications of amputation stump: Secondary | ICD-10-CM | POA: Diagnosis not present

## 2014-03-28 LAB — GLUCOSE, CAPILLARY
GLUCOSE-CAPILLARY: 162 mg/dL — AB (ref 70–99)
Glucose-Capillary: 139 mg/dL — ABNORMAL HIGH (ref 70–99)

## 2014-03-28 NOTE — Telephone Encounter (Signed)
Sent to the pharmacy by e-scribe. 

## 2014-03-29 DIAGNOSIS — T8789 Other complications of amputation stump: Secondary | ICD-10-CM | POA: Diagnosis not present

## 2014-03-29 LAB — GLUCOSE, CAPILLARY: Glucose-Capillary: 142 mg/dL — ABNORMAL HIGH (ref 70–99)

## 2014-03-30 DIAGNOSIS — T8789 Other complications of amputation stump: Secondary | ICD-10-CM | POA: Diagnosis not present

## 2014-03-30 LAB — GLUCOSE, CAPILLARY
GLUCOSE-CAPILLARY: 152 mg/dL — AB (ref 70–99)
GLUCOSE-CAPILLARY: 143 mg/dL — AB (ref 70–99)

## 2014-04-04 DIAGNOSIS — T8789 Other complications of amputation stump: Secondary | ICD-10-CM | POA: Diagnosis not present

## 2014-04-04 LAB — GLUCOSE, CAPILLARY
GLUCOSE-CAPILLARY: 146 mg/dL — AB (ref 70–99)
GLUCOSE-CAPILLARY: 72 mg/dL (ref 70–99)

## 2014-04-05 DIAGNOSIS — T8789 Other complications of amputation stump: Secondary | ICD-10-CM | POA: Diagnosis not present

## 2014-04-05 LAB — GLUCOSE, CAPILLARY
Glucose-Capillary: 69 mg/dL — ABNORMAL LOW (ref 70–99)
Glucose-Capillary: 102 mg/dL — ABNORMAL HIGH (ref 70–99)

## 2014-04-06 DIAGNOSIS — T8789 Other complications of amputation stump: Secondary | ICD-10-CM | POA: Diagnosis not present

## 2014-04-06 LAB — GLUCOSE, CAPILLARY
Glucose-Capillary: 237 mg/dL — ABNORMAL HIGH (ref 70–99)
Glucose-Capillary: 157 mg/dL — ABNORMAL HIGH (ref 70–99)

## 2014-04-09 ENCOUNTER — Encounter (HOSPITAL_BASED_OUTPATIENT_CLINIC_OR_DEPARTMENT_OTHER): Payer: BC Managed Care – PPO | Attending: Plastic Surgery

## 2014-04-09 DIAGNOSIS — S98139A Complete traumatic amputation of one unspecified lesser toe, initial encounter: Secondary | ICD-10-CM | POA: Insufficient documentation

## 2014-04-09 DIAGNOSIS — L97509 Non-pressure chronic ulcer of other part of unspecified foot with unspecified severity: Secondary | ICD-10-CM | POA: Insufficient documentation

## 2014-04-09 DIAGNOSIS — T85698A Other mechanical complication of other specified internal prosthetic devices, implants and grafts, initial encounter: Secondary | ICD-10-CM | POA: Diagnosis present

## 2014-04-09 DIAGNOSIS — Y838 Other surgical procedures as the cause of abnormal reaction of the patient, or of later complication, without mention of misadventure at the time of the procedure: Secondary | ICD-10-CM | POA: Diagnosis not present

## 2014-04-09 DIAGNOSIS — E1169 Type 2 diabetes mellitus with other specified complication: Secondary | ICD-10-CM | POA: Insufficient documentation

## 2014-04-09 LAB — GLUCOSE, CAPILLARY
GLUCOSE-CAPILLARY: 69 mg/dL — AB (ref 70–99)
Glucose-Capillary: 116 mg/dL — ABNORMAL HIGH (ref 70–99)

## 2014-04-10 DIAGNOSIS — T85698A Other mechanical complication of other specified internal prosthetic devices, implants and grafts, initial encounter: Secondary | ICD-10-CM | POA: Diagnosis not present

## 2014-04-10 LAB — GLUCOSE, CAPILLARY
GLUCOSE-CAPILLARY: 129 mg/dL — AB (ref 70–99)
Glucose-Capillary: 197 mg/dL — ABNORMAL HIGH (ref 70–99)

## 2014-04-11 DIAGNOSIS — T85698A Other mechanical complication of other specified internal prosthetic devices, implants and grafts, initial encounter: Secondary | ICD-10-CM | POA: Diagnosis not present

## 2014-04-11 LAB — GLUCOSE, CAPILLARY
GLUCOSE-CAPILLARY: 144 mg/dL — AB (ref 70–99)
Glucose-Capillary: 96 mg/dL (ref 70–99)

## 2014-04-12 DIAGNOSIS — T85698A Other mechanical complication of other specified internal prosthetic devices, implants and grafts, initial encounter: Secondary | ICD-10-CM | POA: Diagnosis not present

## 2014-04-12 LAB — GLUCOSE, CAPILLARY
Glucose-Capillary: 170 mg/dL — ABNORMAL HIGH (ref 70–99)
Glucose-Capillary: 142 mg/dL — ABNORMAL HIGH (ref 70–99)

## 2014-04-13 DIAGNOSIS — T85698A Other mechanical complication of other specified internal prosthetic devices, implants and grafts, initial encounter: Secondary | ICD-10-CM | POA: Diagnosis not present

## 2014-04-13 LAB — GLUCOSE, CAPILLARY
Glucose-Capillary: 169 mg/dL — ABNORMAL HIGH (ref 70–99)
Glucose-Capillary: 181 mg/dL — ABNORMAL HIGH (ref 70–99)

## 2014-04-16 DIAGNOSIS — T85698A Other mechanical complication of other specified internal prosthetic devices, implants and grafts, initial encounter: Secondary | ICD-10-CM | POA: Diagnosis not present

## 2014-04-16 LAB — GLUCOSE, CAPILLARY
GLUCOSE-CAPILLARY: 128 mg/dL — AB (ref 70–99)
Glucose-Capillary: 174 mg/dL — ABNORMAL HIGH (ref 70–99)

## 2014-04-17 DIAGNOSIS — T85698A Other mechanical complication of other specified internal prosthetic devices, implants and grafts, initial encounter: Secondary | ICD-10-CM | POA: Diagnosis not present

## 2014-04-17 LAB — GLUCOSE, CAPILLARY
GLUCOSE-CAPILLARY: 181 mg/dL — AB (ref 70–99)
GLUCOSE-CAPILLARY: 128 mg/dL — AB (ref 70–99)

## 2014-04-18 DIAGNOSIS — T85698A Other mechanical complication of other specified internal prosthetic devices, implants and grafts, initial encounter: Secondary | ICD-10-CM | POA: Diagnosis not present

## 2014-04-18 LAB — GLUCOSE, CAPILLARY
Glucose-Capillary: 134 mg/dL — ABNORMAL HIGH (ref 70–99)
Glucose-Capillary: 154 mg/dL — ABNORMAL HIGH (ref 70–99)

## 2014-04-20 DIAGNOSIS — T85698A Other mechanical complication of other specified internal prosthetic devices, implants and grafts, initial encounter: Secondary | ICD-10-CM | POA: Diagnosis not present

## 2014-04-20 LAB — GLUCOSE, CAPILLARY
Glucose-Capillary: 77 mg/dL (ref 70–99)
Glucose-Capillary: 101 mg/dL — ABNORMAL HIGH (ref 70–99)

## 2014-04-23 DIAGNOSIS — T85698A Other mechanical complication of other specified internal prosthetic devices, implants and grafts, initial encounter: Secondary | ICD-10-CM | POA: Diagnosis not present

## 2014-04-23 LAB — GLUCOSE, CAPILLARY
GLUCOSE-CAPILLARY: 251 mg/dL — AB (ref 70–99)
Glucose-Capillary: 292 mg/dL — ABNORMAL HIGH (ref 70–99)

## 2014-04-24 DIAGNOSIS — T85698A Other mechanical complication of other specified internal prosthetic devices, implants and grafts, initial encounter: Secondary | ICD-10-CM | POA: Diagnosis not present

## 2014-04-24 LAB — GLUCOSE, CAPILLARY
GLUCOSE-CAPILLARY: 102 mg/dL — AB (ref 70–99)
GLUCOSE-CAPILLARY: 133 mg/dL — AB (ref 70–99)
Glucose-Capillary: 169 mg/dL — ABNORMAL HIGH (ref 70–99)

## 2014-04-25 DIAGNOSIS — T85698A Other mechanical complication of other specified internal prosthetic devices, implants and grafts, initial encounter: Secondary | ICD-10-CM | POA: Diagnosis not present

## 2014-04-25 LAB — GLUCOSE, CAPILLARY
GLUCOSE-CAPILLARY: 123 mg/dL — AB (ref 70–99)
Glucose-Capillary: 197 mg/dL — ABNORMAL HIGH (ref 70–99)

## 2014-04-26 DIAGNOSIS — T85698A Other mechanical complication of other specified internal prosthetic devices, implants and grafts, initial encounter: Secondary | ICD-10-CM | POA: Diagnosis not present

## 2014-04-26 LAB — GLUCOSE, CAPILLARY
Glucose-Capillary: 126 mg/dL — ABNORMAL HIGH (ref 70–99)
Glucose-Capillary: 92 mg/dL (ref 70–99)

## 2014-04-27 DIAGNOSIS — T85698A Other mechanical complication of other specified internal prosthetic devices, implants and grafts, initial encounter: Secondary | ICD-10-CM | POA: Diagnosis not present

## 2014-04-27 LAB — GLUCOSE, CAPILLARY
GLUCOSE-CAPILLARY: 144 mg/dL — AB (ref 70–99)
Glucose-Capillary: 125 mg/dL — ABNORMAL HIGH (ref 70–99)

## 2014-04-30 DIAGNOSIS — T85698A Other mechanical complication of other specified internal prosthetic devices, implants and grafts, initial encounter: Secondary | ICD-10-CM | POA: Diagnosis not present

## 2014-04-30 LAB — GLUCOSE, CAPILLARY
GLUCOSE-CAPILLARY: 190 mg/dL — AB (ref 70–99)
Glucose-Capillary: 190 mg/dL — ABNORMAL HIGH (ref 70–99)

## 2014-05-01 ENCOUNTER — Other Ambulatory Visit: Payer: Self-pay | Admitting: Family

## 2014-05-01 DIAGNOSIS — T85698A Other mechanical complication of other specified internal prosthetic devices, implants and grafts, initial encounter: Secondary | ICD-10-CM | POA: Diagnosis not present

## 2014-05-01 LAB — GLUCOSE, CAPILLARY
GLUCOSE-CAPILLARY: 130 mg/dL — AB (ref 70–99)
Glucose-Capillary: 142 mg/dL — ABNORMAL HIGH (ref 70–99)

## 2014-05-02 DIAGNOSIS — T85698A Other mechanical complication of other specified internal prosthetic devices, implants and grafts, initial encounter: Secondary | ICD-10-CM | POA: Diagnosis not present

## 2014-05-02 LAB — GLUCOSE, CAPILLARY
GLUCOSE-CAPILLARY: 111 mg/dL — AB (ref 70–99)
Glucose-Capillary: 198 mg/dL — ABNORMAL HIGH (ref 70–99)

## 2014-05-03 DIAGNOSIS — T85698A Other mechanical complication of other specified internal prosthetic devices, implants and grafts, initial encounter: Secondary | ICD-10-CM | POA: Diagnosis not present

## 2014-05-03 LAB — GLUCOSE, CAPILLARY
Glucose-Capillary: 173 mg/dL — ABNORMAL HIGH (ref 70–99)
Glucose-Capillary: 112 mg/dL — ABNORMAL HIGH (ref 70–99)

## 2014-05-04 DIAGNOSIS — T85698A Other mechanical complication of other specified internal prosthetic devices, implants and grafts, initial encounter: Secondary | ICD-10-CM | POA: Diagnosis not present

## 2014-05-04 LAB — GLUCOSE, CAPILLARY
GLUCOSE-CAPILLARY: 144 mg/dL — AB (ref 70–99)
Glucose-Capillary: 125 mg/dL — ABNORMAL HIGH (ref 70–99)

## 2014-05-07 DIAGNOSIS — T85698A Other mechanical complication of other specified internal prosthetic devices, implants and grafts, initial encounter: Secondary | ICD-10-CM | POA: Diagnosis not present

## 2014-05-07 LAB — GLUCOSE, CAPILLARY
GLUCOSE-CAPILLARY: 108 mg/dL — AB (ref 70–99)
Glucose-Capillary: 144 mg/dL — ABNORMAL HIGH (ref 70–99)

## 2014-05-08 DIAGNOSIS — T85698A Other mechanical complication of other specified internal prosthetic devices, implants and grafts, initial encounter: Secondary | ICD-10-CM | POA: Diagnosis not present

## 2014-05-09 ENCOUNTER — Encounter (HOSPITAL_BASED_OUTPATIENT_CLINIC_OR_DEPARTMENT_OTHER): Payer: BC Managed Care – PPO | Attending: General Surgery

## 2014-05-09 DIAGNOSIS — T85698A Other mechanical complication of other specified internal prosthetic devices, implants and grafts, initial encounter: Secondary | ICD-10-CM | POA: Insufficient documentation

## 2014-05-09 DIAGNOSIS — S98919A Complete traumatic amputation of unspecified foot, level unspecified, initial encounter: Secondary | ICD-10-CM | POA: Insufficient documentation

## 2014-05-09 DIAGNOSIS — Y832 Surgical operation with anastomosis, bypass or graft as the cause of abnormal reaction of the patient, or of later complication, without mention of misadventure at the time of the procedure: Secondary | ICD-10-CM | POA: Insufficient documentation

## 2014-05-09 DIAGNOSIS — L97509 Non-pressure chronic ulcer of other part of unspecified foot with unspecified severity: Secondary | ICD-10-CM | POA: Insufficient documentation

## 2014-05-09 DIAGNOSIS — E1169 Type 2 diabetes mellitus with other specified complication: Secondary | ICD-10-CM | POA: Insufficient documentation

## 2014-05-09 LAB — GLUCOSE, CAPILLARY
Glucose-Capillary: 158 mg/dL — ABNORMAL HIGH (ref 70–99)
Glucose-Capillary: 147 mg/dL — ABNORMAL HIGH (ref 70–99)

## 2014-05-10 DIAGNOSIS — L97509 Non-pressure chronic ulcer of other part of unspecified foot with unspecified severity: Secondary | ICD-10-CM | POA: Diagnosis not present

## 2014-05-10 DIAGNOSIS — S98919A Complete traumatic amputation of unspecified foot, level unspecified, initial encounter: Secondary | ICD-10-CM | POA: Diagnosis not present

## 2014-05-10 DIAGNOSIS — Y832 Surgical operation with anastomosis, bypass or graft as the cause of abnormal reaction of the patient, or of later complication, without mention of misadventure at the time of the procedure: Secondary | ICD-10-CM | POA: Diagnosis not present

## 2014-05-10 DIAGNOSIS — E1169 Type 2 diabetes mellitus with other specified complication: Secondary | ICD-10-CM | POA: Diagnosis not present

## 2014-05-10 DIAGNOSIS — T85698A Other mechanical complication of other specified internal prosthetic devices, implants and grafts, initial encounter: Secondary | ICD-10-CM | POA: Diagnosis present

## 2014-05-14 DIAGNOSIS — T85698A Other mechanical complication of other specified internal prosthetic devices, implants and grafts, initial encounter: Secondary | ICD-10-CM | POA: Diagnosis not present

## 2014-05-14 LAB — GLUCOSE, CAPILLARY
GLUCOSE-CAPILLARY: 113 mg/dL — AB (ref 70–99)
Glucose-Capillary: 88 mg/dL (ref 70–99)

## 2014-05-15 DIAGNOSIS — T85698A Other mechanical complication of other specified internal prosthetic devices, implants and grafts, initial encounter: Secondary | ICD-10-CM | POA: Diagnosis not present

## 2014-05-15 LAB — GLUCOSE, CAPILLARY
GLUCOSE-CAPILLARY: 87 mg/dL (ref 70–99)
GLUCOSE-CAPILLARY: 56 mg/dL — AB (ref 70–99)
Glucose-Capillary: 89 mg/dL (ref 70–99)
Glucose-Capillary: 116 mg/dL — ABNORMAL HIGH (ref 70–99)

## 2014-05-16 DIAGNOSIS — T85698A Other mechanical complication of other specified internal prosthetic devices, implants and grafts, initial encounter: Secondary | ICD-10-CM | POA: Diagnosis not present

## 2014-05-16 LAB — GLUCOSE, CAPILLARY
Glucose-Capillary: 156 mg/dL — ABNORMAL HIGH (ref 70–99)
Glucose-Capillary: 81 mg/dL (ref 70–99)

## 2014-05-17 DIAGNOSIS — T85698A Other mechanical complication of other specified internal prosthetic devices, implants and grafts, initial encounter: Secondary | ICD-10-CM | POA: Diagnosis not present

## 2014-05-17 LAB — GLUCOSE, CAPILLARY
Glucose-Capillary: 226 mg/dL — ABNORMAL HIGH (ref 70–99)
Glucose-Capillary: 242 mg/dL — ABNORMAL HIGH (ref 70–99)

## 2014-05-18 DIAGNOSIS — T85698A Other mechanical complication of other specified internal prosthetic devices, implants and grafts, initial encounter: Secondary | ICD-10-CM | POA: Diagnosis not present

## 2014-05-18 LAB — GLUCOSE, CAPILLARY
Glucose-Capillary: 142 mg/dL — ABNORMAL HIGH (ref 70–99)
Glucose-Capillary: 140 mg/dL — ABNORMAL HIGH (ref 70–99)

## 2014-05-21 DIAGNOSIS — T85698A Other mechanical complication of other specified internal prosthetic devices, implants and grafts, initial encounter: Secondary | ICD-10-CM | POA: Diagnosis not present

## 2014-05-21 LAB — GLUCOSE, CAPILLARY
GLUCOSE-CAPILLARY: 163 mg/dL — AB (ref 70–99)
GLUCOSE-CAPILLARY: 130 mg/dL — AB (ref 70–99)

## 2014-05-22 DIAGNOSIS — T85698A Other mechanical complication of other specified internal prosthetic devices, implants and grafts, initial encounter: Secondary | ICD-10-CM | POA: Diagnosis not present

## 2014-05-22 LAB — GLUCOSE, CAPILLARY
GLUCOSE-CAPILLARY: 156 mg/dL — AB (ref 70–99)
Glucose-Capillary: 141 mg/dL — ABNORMAL HIGH (ref 70–99)

## 2014-05-23 DIAGNOSIS — T85698A Other mechanical complication of other specified internal prosthetic devices, implants and grafts, initial encounter: Secondary | ICD-10-CM | POA: Diagnosis not present

## 2014-05-23 LAB — GLUCOSE, CAPILLARY
Glucose-Capillary: 205 mg/dL — ABNORMAL HIGH (ref 70–99)
Glucose-Capillary: 172 mg/dL — ABNORMAL HIGH (ref 70–99)

## 2014-05-24 DIAGNOSIS — T85698A Other mechanical complication of other specified internal prosthetic devices, implants and grafts, initial encounter: Secondary | ICD-10-CM | POA: Diagnosis not present

## 2014-05-24 LAB — GLUCOSE, CAPILLARY
Glucose-Capillary: 133 mg/dL — ABNORMAL HIGH (ref 70–99)
Glucose-Capillary: 113 mg/dL — ABNORMAL HIGH (ref 70–99)

## 2014-05-29 DIAGNOSIS — T85698A Other mechanical complication of other specified internal prosthetic devices, implants and grafts, initial encounter: Secondary | ICD-10-CM | POA: Diagnosis not present

## 2014-05-29 LAB — GLUCOSE, CAPILLARY
Glucose-Capillary: 135 mg/dL — ABNORMAL HIGH (ref 70–99)
Glucose-Capillary: 135 mg/dL — ABNORMAL HIGH (ref 70–99)

## 2014-05-30 DIAGNOSIS — T85698A Other mechanical complication of other specified internal prosthetic devices, implants and grafts, initial encounter: Secondary | ICD-10-CM | POA: Diagnosis not present

## 2014-05-30 LAB — GLUCOSE, CAPILLARY
Glucose-Capillary: 144 mg/dL — ABNORMAL HIGH (ref 70–99)
Glucose-Capillary: 134 mg/dL — ABNORMAL HIGH (ref 70–99)

## 2014-05-30 NOTE — Progress Notes (Signed)
[facility]  NAMEANICA, Tara Hopkins            ACCOUNT NO.:  [id number]  MEDICAL RECORD NO.:  [id number]      DATE OF BIRTH:  Feb 02, 1951  PHYSICIAN:  Judene Companion, M.D.           VISIT DATE:                                  OFFICE VISIT   This is a 63 year old diabetic female, who has gone through a great deal of problems with her right foot.  She has had to have several amputations, the last being a transmetatarsal amputation of the right foot.  Since that time, she has had nonhealing of the flap and we have been treating it with hyperbaric oxygen, and now we have been putting on collagen and most recently, we are planning on putting on a Dermagraft. The nonhealing flap has improved greatly with hyperbaric oxygen, and now it is only about 2 cm x 1 cm, which is about a third of the size when we originally started hyperbaric oxygen.  She has excellent pulses and I think we have a great chance of healing this transmetatarsal wound with its very slow healing flap.  We are covering it at the present time with collagen and we are planning on Dermagraft along with the hyperbaric oxygen.  So, I would like to apply for 30 more treatments of hyperbaric oxygen in order to heal this wound.     Judene Companion, M.D.     PP/MEDQ  D:  05/30/2014  T:  05/30/2014  Job:  [id number]

## 2014-06-01 ENCOUNTER — Telehealth: Payer: Self-pay | Admitting: Family

## 2014-06-01 DIAGNOSIS — T85698A Other mechanical complication of other specified internal prosthetic devices, implants and grafts, initial encounter: Secondary | ICD-10-CM | POA: Diagnosis not present

## 2014-06-01 MED ORDER — ONETOUCH LANCETS MISC: Status: DC

## 2014-06-01 MED ORDER — GLUCOSE BLOOD VI STRP: ORAL_STRIP | Status: DC

## 2014-06-01 NOTE — Telephone Encounter (Signed)
Pt needs new rxs for lancets and test strips one touch ultra mini sent to [facility]

## 2014-06-01 NOTE — Telephone Encounter (Signed)
DONE

## 2014-06-11 ENCOUNTER — Encounter (HOSPITAL_BASED_OUTPATIENT_CLINIC_OR_DEPARTMENT_OTHER): Payer: BC Managed Care – PPO | Attending: General Surgery

## 2014-06-11 DIAGNOSIS — E1169 Type 2 diabetes mellitus with other specified complication: Secondary | ICD-10-CM | POA: Diagnosis not present

## 2014-06-11 DIAGNOSIS — S98919A Complete traumatic amputation of unspecified foot, level unspecified, initial encounter: Secondary | ICD-10-CM | POA: Diagnosis not present

## 2014-06-11 DIAGNOSIS — T85698A Other mechanical complication of other specified internal prosthetic devices, implants and grafts, initial encounter: Secondary | ICD-10-CM | POA: Diagnosis present

## 2014-06-11 DIAGNOSIS — L97509 Non-pressure chronic ulcer of other part of unspecified foot with unspecified severity: Secondary | ICD-10-CM | POA: Insufficient documentation

## 2014-06-11 DIAGNOSIS — Y832 Surgical operation with anastomosis, bypass or graft as the cause of abnormal reaction of the patient, or of later complication, without mention of misadventure at the time of the procedure: Secondary | ICD-10-CM | POA: Diagnosis not present

## 2014-06-12 DIAGNOSIS — S98919A Complete traumatic amputation of unspecified foot, level unspecified, initial encounter: Secondary | ICD-10-CM | POA: Diagnosis not present

## 2014-06-12 DIAGNOSIS — L97509 Non-pressure chronic ulcer of other part of unspecified foot with unspecified severity: Secondary | ICD-10-CM | POA: Diagnosis not present

## 2014-06-12 DIAGNOSIS — T85698A Other mechanical complication of other specified internal prosthetic devices, implants and grafts, initial encounter: Secondary | ICD-10-CM | POA: Diagnosis not present

## 2014-06-12 DIAGNOSIS — E1169 Type 2 diabetes mellitus with other specified complication: Secondary | ICD-10-CM | POA: Diagnosis not present

## 2014-06-12 LAB — GLUCOSE, CAPILLARY
GLUCOSE-CAPILLARY: 120 mg/dL — AB (ref 70–99)
Glucose-Capillary: 124 mg/dL — ABNORMAL HIGH (ref 70–99)

## 2014-06-13 DIAGNOSIS — E1169 Type 2 diabetes mellitus with other specified complication: Secondary | ICD-10-CM | POA: Diagnosis not present

## 2014-06-13 DIAGNOSIS — L97509 Non-pressure chronic ulcer of other part of unspecified foot with unspecified severity: Secondary | ICD-10-CM | POA: Diagnosis not present

## 2014-06-13 DIAGNOSIS — T85698A Other mechanical complication of other specified internal prosthetic devices, implants and grafts, initial encounter: Secondary | ICD-10-CM | POA: Diagnosis not present

## 2014-06-13 DIAGNOSIS — S98919A Complete traumatic amputation of unspecified foot, level unspecified, initial encounter: Secondary | ICD-10-CM | POA: Diagnosis not present

## 2014-06-13 LAB — GLUCOSE, CAPILLARY
GLUCOSE-CAPILLARY: 147 mg/dL — AB (ref 70–99)
GLUCOSE-CAPILLARY: 238 mg/dL — AB (ref 70–99)
GLUCOSE-CAPILLARY: 176 mg/dL — AB (ref 70–99)
Glucose-Capillary: 219 mg/dL — ABNORMAL HIGH (ref 70–99)
Glucose-Capillary: 171 mg/dL — ABNORMAL HIGH (ref 70–99)

## 2014-06-14 DIAGNOSIS — E1169 Type 2 diabetes mellitus with other specified complication: Secondary | ICD-10-CM | POA: Diagnosis not present

## 2014-06-14 DIAGNOSIS — T85698A Other mechanical complication of other specified internal prosthetic devices, implants and grafts, initial encounter: Secondary | ICD-10-CM | POA: Diagnosis not present

## 2014-06-14 DIAGNOSIS — S98919A Complete traumatic amputation of unspecified foot, level unspecified, initial encounter: Secondary | ICD-10-CM | POA: Diagnosis not present

## 2014-06-14 DIAGNOSIS — L97509 Non-pressure chronic ulcer of other part of unspecified foot with unspecified severity: Secondary | ICD-10-CM | POA: Diagnosis not present

## 2014-06-14 LAB — GLUCOSE, CAPILLARY
GLUCOSE-CAPILLARY: 141 mg/dL — AB (ref 70–99)
Glucose-Capillary: 206 mg/dL — ABNORMAL HIGH (ref 70–99)

## 2014-06-15 DIAGNOSIS — S98919A Complete traumatic amputation of unspecified foot, level unspecified, initial encounter: Secondary | ICD-10-CM | POA: Diagnosis not present

## 2014-06-15 DIAGNOSIS — T85698A Other mechanical complication of other specified internal prosthetic devices, implants and grafts, initial encounter: Secondary | ICD-10-CM | POA: Diagnosis not present

## 2014-06-15 DIAGNOSIS — L97509 Non-pressure chronic ulcer of other part of unspecified foot with unspecified severity: Secondary | ICD-10-CM | POA: Diagnosis not present

## 2014-06-15 DIAGNOSIS — E1169 Type 2 diabetes mellitus with other specified complication: Secondary | ICD-10-CM | POA: Diagnosis not present

## 2014-06-15 LAB — GLUCOSE, CAPILLARY
Glucose-Capillary: 142 mg/dL — ABNORMAL HIGH (ref 70–99)
Glucose-Capillary: 119 mg/dL — ABNORMAL HIGH (ref 70–99)

## 2014-06-18 DIAGNOSIS — S98919A Complete traumatic amputation of unspecified foot, level unspecified, initial encounter: Secondary | ICD-10-CM | POA: Diagnosis not present

## 2014-06-18 DIAGNOSIS — E1169 Type 2 diabetes mellitus with other specified complication: Secondary | ICD-10-CM | POA: Diagnosis not present

## 2014-06-18 DIAGNOSIS — L97509 Non-pressure chronic ulcer of other part of unspecified foot with unspecified severity: Secondary | ICD-10-CM | POA: Diagnosis not present

## 2014-06-18 DIAGNOSIS — T85698A Other mechanical complication of other specified internal prosthetic devices, implants and grafts, initial encounter: Secondary | ICD-10-CM | POA: Diagnosis not present

## 2014-06-18 LAB — GLUCOSE, CAPILLARY
GLUCOSE-CAPILLARY: 156 mg/dL — AB (ref 70–99)
Glucose-Capillary: 181 mg/dL — ABNORMAL HIGH (ref 70–99)

## 2014-06-19 DIAGNOSIS — T85698A Other mechanical complication of other specified internal prosthetic devices, implants and grafts, initial encounter: Secondary | ICD-10-CM | POA: Diagnosis not present

## 2014-06-19 DIAGNOSIS — L97509 Non-pressure chronic ulcer of other part of unspecified foot with unspecified severity: Secondary | ICD-10-CM | POA: Diagnosis not present

## 2014-06-19 DIAGNOSIS — S98919A Complete traumatic amputation of unspecified foot, level unspecified, initial encounter: Secondary | ICD-10-CM | POA: Diagnosis not present

## 2014-06-19 DIAGNOSIS — E1169 Type 2 diabetes mellitus with other specified complication: Secondary | ICD-10-CM | POA: Diagnosis not present

## 2014-06-19 LAB — GLUCOSE, CAPILLARY
Glucose-Capillary: 173 mg/dL — ABNORMAL HIGH (ref 70–99)
Glucose-Capillary: 173 mg/dL — ABNORMAL HIGH (ref 70–99)

## 2014-06-22 DIAGNOSIS — L97509 Non-pressure chronic ulcer of other part of unspecified foot with unspecified severity: Secondary | ICD-10-CM | POA: Diagnosis not present

## 2014-06-22 DIAGNOSIS — T85698A Other mechanical complication of other specified internal prosthetic devices, implants and grafts, initial encounter: Secondary | ICD-10-CM | POA: Diagnosis not present

## 2014-06-22 DIAGNOSIS — E1169 Type 2 diabetes mellitus with other specified complication: Secondary | ICD-10-CM | POA: Diagnosis not present

## 2014-06-22 DIAGNOSIS — S98919A Complete traumatic amputation of unspecified foot, level unspecified, initial encounter: Secondary | ICD-10-CM | POA: Diagnosis not present

## 2014-06-22 LAB — GLUCOSE, CAPILLARY
GLUCOSE-CAPILLARY: 143 mg/dL — AB (ref 70–99)
Glucose-Capillary: 186 mg/dL — ABNORMAL HIGH (ref 70–99)

## 2014-06-25 DIAGNOSIS — S98919A Complete traumatic amputation of unspecified foot, level unspecified, initial encounter: Secondary | ICD-10-CM | POA: Diagnosis not present

## 2014-06-25 DIAGNOSIS — L97509 Non-pressure chronic ulcer of other part of unspecified foot with unspecified severity: Secondary | ICD-10-CM | POA: Diagnosis not present

## 2014-06-25 DIAGNOSIS — T85698A Other mechanical complication of other specified internal prosthetic devices, implants and grafts, initial encounter: Secondary | ICD-10-CM | POA: Diagnosis not present

## 2014-06-25 DIAGNOSIS — E1169 Type 2 diabetes mellitus with other specified complication: Secondary | ICD-10-CM | POA: Diagnosis not present

## 2014-06-25 LAB — GLUCOSE, CAPILLARY
GLUCOSE-CAPILLARY: 204 mg/dL — AB (ref 70–99)
Glucose-Capillary: 202 mg/dL — ABNORMAL HIGH (ref 70–99)

## 2014-06-26 ENCOUNTER — Other Ambulatory Visit: Payer: Self-pay | Admitting: Family

## 2014-06-26 DIAGNOSIS — L97509 Non-pressure chronic ulcer of other part of unspecified foot with unspecified severity: Secondary | ICD-10-CM | POA: Diagnosis not present

## 2014-06-26 DIAGNOSIS — T85698A Other mechanical complication of other specified internal prosthetic devices, implants and grafts, initial encounter: Secondary | ICD-10-CM | POA: Diagnosis not present

## 2014-06-26 DIAGNOSIS — E1169 Type 2 diabetes mellitus with other specified complication: Secondary | ICD-10-CM | POA: Diagnosis not present

## 2014-06-26 DIAGNOSIS — S98919A Complete traumatic amputation of unspecified foot, level unspecified, initial encounter: Secondary | ICD-10-CM | POA: Diagnosis not present

## 2014-06-26 LAB — GLUCOSE, CAPILLARY: GLUCOSE-CAPILLARY: 163 mg/dL — AB (ref 70–99)

## 2014-06-27 DIAGNOSIS — E1169 Type 2 diabetes mellitus with other specified complication: Secondary | ICD-10-CM | POA: Diagnosis not present

## 2014-06-27 DIAGNOSIS — T85698A Other mechanical complication of other specified internal prosthetic devices, implants and grafts, initial encounter: Secondary | ICD-10-CM | POA: Diagnosis not present

## 2014-06-27 DIAGNOSIS — L97509 Non-pressure chronic ulcer of other part of unspecified foot with unspecified severity: Secondary | ICD-10-CM | POA: Diagnosis not present

## 2014-06-27 DIAGNOSIS — S98919A Complete traumatic amputation of unspecified foot, level unspecified, initial encounter: Secondary | ICD-10-CM | POA: Diagnosis not present

## 2014-06-27 LAB — GLUCOSE, CAPILLARY
GLUCOSE-CAPILLARY: 293 mg/dL — AB (ref 70–99)
Glucose-Capillary: 259 mg/dL — ABNORMAL HIGH (ref 70–99)

## 2014-06-29 DIAGNOSIS — E1169 Type 2 diabetes mellitus with other specified complication: Secondary | ICD-10-CM | POA: Diagnosis not present

## 2014-06-29 DIAGNOSIS — L97509 Non-pressure chronic ulcer of other part of unspecified foot with unspecified severity: Secondary | ICD-10-CM | POA: Diagnosis not present

## 2014-06-29 DIAGNOSIS — T85698A Other mechanical complication of other specified internal prosthetic devices, implants and grafts, initial encounter: Secondary | ICD-10-CM | POA: Diagnosis not present

## 2014-06-29 DIAGNOSIS — S98919A Complete traumatic amputation of unspecified foot, level unspecified, initial encounter: Secondary | ICD-10-CM | POA: Diagnosis not present

## 2014-06-29 LAB — GLUCOSE, CAPILLARY
Glucose-Capillary: 207 mg/dL — ABNORMAL HIGH (ref 70–99)
Glucose-Capillary: 204 mg/dL — ABNORMAL HIGH (ref 70–99)

## 2014-07-02 DIAGNOSIS — T85698A Other mechanical complication of other specified internal prosthetic devices, implants and grafts, initial encounter: Secondary | ICD-10-CM | POA: Diagnosis not present

## 2014-07-02 DIAGNOSIS — L97509 Non-pressure chronic ulcer of other part of unspecified foot with unspecified severity: Secondary | ICD-10-CM | POA: Diagnosis not present

## 2014-07-02 DIAGNOSIS — E1169 Type 2 diabetes mellitus with other specified complication: Secondary | ICD-10-CM | POA: Diagnosis not present

## 2014-07-02 DIAGNOSIS — S98919A Complete traumatic amputation of unspecified foot, level unspecified, initial encounter: Secondary | ICD-10-CM | POA: Diagnosis not present

## 2014-07-02 LAB — GLUCOSE, CAPILLARY
GLUCOSE-CAPILLARY: 255 mg/dL — AB (ref 70–99)
Glucose-Capillary: 238 mg/dL — ABNORMAL HIGH (ref 70–99)

## 2014-07-05 DIAGNOSIS — T85698A Other mechanical complication of other specified internal prosthetic devices, implants and grafts, initial encounter: Secondary | ICD-10-CM | POA: Diagnosis not present

## 2014-07-05 DIAGNOSIS — E1169 Type 2 diabetes mellitus with other specified complication: Secondary | ICD-10-CM | POA: Diagnosis not present

## 2014-07-05 DIAGNOSIS — L97509 Non-pressure chronic ulcer of other part of unspecified foot with unspecified severity: Secondary | ICD-10-CM | POA: Diagnosis not present

## 2014-07-05 DIAGNOSIS — S98919A Complete traumatic amputation of unspecified foot, level unspecified, initial encounter: Secondary | ICD-10-CM | POA: Diagnosis not present

## 2014-07-05 LAB — GLUCOSE, CAPILLARY
GLUCOSE-CAPILLARY: 219 mg/dL — AB (ref 70–99)
GLUCOSE-CAPILLARY: 209 mg/dL — AB (ref 70–99)
Glucose-Capillary: 218 mg/dL — ABNORMAL HIGH (ref 70–99)

## 2014-07-06 DIAGNOSIS — E1169 Type 2 diabetes mellitus with other specified complication: Secondary | ICD-10-CM | POA: Diagnosis not present

## 2014-07-06 DIAGNOSIS — T85698A Other mechanical complication of other specified internal prosthetic devices, implants and grafts, initial encounter: Secondary | ICD-10-CM | POA: Diagnosis not present

## 2014-07-06 DIAGNOSIS — L97509 Non-pressure chronic ulcer of other part of unspecified foot with unspecified severity: Secondary | ICD-10-CM | POA: Diagnosis not present

## 2014-07-06 DIAGNOSIS — S98919A Complete traumatic amputation of unspecified foot, level unspecified, initial encounter: Secondary | ICD-10-CM | POA: Diagnosis not present

## 2014-07-06 LAB — GLUCOSE, CAPILLARY
Glucose-Capillary: 152 mg/dL — ABNORMAL HIGH (ref 70–99)
Glucose-Capillary: 194 mg/dL — ABNORMAL HIGH (ref 70–99)
Glucose-Capillary: 137 mg/dL — ABNORMAL HIGH (ref 70–99)

## 2014-07-09 DIAGNOSIS — E1169 Type 2 diabetes mellitus with other specified complication: Secondary | ICD-10-CM | POA: Diagnosis not present

## 2014-07-09 DIAGNOSIS — L97509 Non-pressure chronic ulcer of other part of unspecified foot with unspecified severity: Secondary | ICD-10-CM | POA: Diagnosis not present

## 2014-07-09 DIAGNOSIS — S98919A Complete traumatic amputation of unspecified foot, level unspecified, initial encounter: Secondary | ICD-10-CM | POA: Diagnosis not present

## 2014-07-09 DIAGNOSIS — T85698A Other mechanical complication of other specified internal prosthetic devices, implants and grafts, initial encounter: Secondary | ICD-10-CM | POA: Diagnosis not present

## 2014-07-09 LAB — GLUCOSE, CAPILLARY
GLUCOSE-CAPILLARY: 262 mg/dL — AB (ref 70–99)
GLUCOSE-CAPILLARY: 234 mg/dL — AB (ref 70–99)
Glucose-Capillary: 209 mg/dL — ABNORMAL HIGH (ref 70–99)

## 2014-07-10 ENCOUNTER — Encounter (HOSPITAL_BASED_OUTPATIENT_CLINIC_OR_DEPARTMENT_OTHER): Payer: BC Managed Care – PPO | Attending: General Surgery

## 2014-07-10 DIAGNOSIS — L97509 Non-pressure chronic ulcer of other part of unspecified foot with unspecified severity: Secondary | ICD-10-CM | POA: Insufficient documentation

## 2014-07-10 DIAGNOSIS — E1169 Type 2 diabetes mellitus with other specified complication: Secondary | ICD-10-CM | POA: Insufficient documentation

## 2014-07-10 DIAGNOSIS — T85698A Other mechanical complication of other specified internal prosthetic devices, implants and grafts, initial encounter: Secondary | ICD-10-CM | POA: Insufficient documentation

## 2014-07-10 LAB — GLUCOSE, CAPILLARY
GLUCOSE-CAPILLARY: 182 mg/dL — AB (ref 70–99)
Glucose-Capillary: 118 mg/dL — ABNORMAL HIGH (ref 70–99)
Glucose-Capillary: 219 mg/dL — ABNORMAL HIGH (ref 70–99)

## 2014-07-11 DIAGNOSIS — T85698A Other mechanical complication of other specified internal prosthetic devices, implants and grafts, initial encounter: Secondary | ICD-10-CM | POA: Diagnosis not present

## 2014-07-11 DIAGNOSIS — E1169 Type 2 diabetes mellitus with other specified complication: Secondary | ICD-10-CM | POA: Diagnosis not present

## 2014-07-11 DIAGNOSIS — L97509 Non-pressure chronic ulcer of other part of unspecified foot with unspecified severity: Secondary | ICD-10-CM | POA: Diagnosis not present

## 2014-07-12 DIAGNOSIS — T85698A Other mechanical complication of other specified internal prosthetic devices, implants and grafts, initial encounter: Secondary | ICD-10-CM | POA: Diagnosis not present

## 2014-07-12 DIAGNOSIS — E1169 Type 2 diabetes mellitus with other specified complication: Secondary | ICD-10-CM | POA: Diagnosis not present

## 2014-07-12 DIAGNOSIS — L97509 Non-pressure chronic ulcer of other part of unspecified foot with unspecified severity: Secondary | ICD-10-CM | POA: Diagnosis not present

## 2014-07-12 LAB — GLUCOSE, CAPILLARY
Glucose-Capillary: 134 mg/dL — ABNORMAL HIGH (ref 70–99)
Glucose-Capillary: 182 mg/dL — ABNORMAL HIGH (ref 70–99)

## 2014-07-13 LAB — GLUCOSE, CAPILLARY
GLUCOSE-CAPILLARY: 263 mg/dL — AB (ref 70–99)
Glucose-Capillary: 258 mg/dL — ABNORMAL HIGH (ref 70–99)

## 2014-08-03 ENCOUNTER — Other Ambulatory Visit: Payer: Self-pay | Admitting: Family

## 2014-08-13 ENCOUNTER — Telehealth: Payer: Self-pay | Admitting: Family

## 2014-08-13 NOTE — Telephone Encounter (Signed)
Pt states papers have been sent to the incorrect location. A follow up appointment has been scheduled and pt will bring information at that time

## 2014-08-13 NOTE — Telephone Encounter (Signed)
Pt wants a callback to discuss her disability claim.  She states our office is denying the paperwork.

## 2014-08-20 ENCOUNTER — Telehealth: Payer: Self-pay | Admitting: Family

## 2014-08-20 ENCOUNTER — Encounter: Payer: Self-pay | Admitting: Family

## 2014-08-20 ENCOUNTER — Ambulatory Visit (INDEPENDENT_AMBULATORY_CARE_PROVIDER_SITE_OTHER): Payer: BC Managed Care – PPO | Admitting: Family

## 2014-08-20 VITALS — BP 182/100 | HR 72 | Wt 169.0 lb

## 2014-08-20 DIAGNOSIS — I10 Essential (primary) hypertension: Secondary | ICD-10-CM

## 2014-08-20 DIAGNOSIS — E7800 Pure hypercholesterolemia, unspecified: Secondary | ICD-10-CM

## 2014-08-20 DIAGNOSIS — I798 Other disorders of arteries, arterioles and capillaries in diseases classified elsewhere: Secondary | ICD-10-CM

## 2014-08-20 DIAGNOSIS — Z23 Encounter for immunization: Secondary | ICD-10-CM

## 2014-08-20 DIAGNOSIS — E1151 Type 2 diabetes mellitus with diabetic peripheral angiopathy without gangrene: Secondary | ICD-10-CM

## 2014-08-20 LAB — BASIC METABOLIC PANEL
BUN: 28 mg/dL — ABNORMAL HIGH (ref 6–23)
CALCIUM: 9.5 mg/dL (ref 8.4–10.5)
CO2: 24 mEq/L (ref 19–32)
Chloride: 107 mEq/L (ref 96–112)
Creatinine, Ser: 1.7 mg/dL — ABNORMAL HIGH (ref 0.4–1.2)
GFR: 39.95 mL/min — AB (ref >60.00)
Glucose, Bld: 62 mg/dL — ABNORMAL LOW (ref 70–99)
POTASSIUM: 4.9 meq/L (ref 3.5–5.1)
SODIUM: 137 meq/L (ref 135–145)

## 2014-08-20 LAB — HEPATIC FUNCTION PANEL
ALT: 17 U/L (ref 0–35)
AST: 18 U/L (ref 0–37)
Albumin: 3.7 g/dL (ref 3.5–5.2)
Alkaline Phosphatase: 91 U/L (ref 39–117)
Bilirubin, Direct: 0 mg/dL (ref 0.0–0.3)
Total Bilirubin: 0.3 mg/dL (ref 0.2–1.2)
Total Protein: 7.7 g/dL (ref 6.0–8.3)

## 2014-08-20 LAB — LIPID PANEL
Cholesterol: 164 mg/dL (ref 0–200)
HDL: 46.1 mg/dL (ref >39.00)
LDL CALC: 88 mg/dL (ref 0–99)
NONHDL: 117.9
Total CHOL/HDL Ratio: 4
Triglycerides: 149 mg/dL (ref 0.0–149.0)
VLDL: 29.8 mg/dL (ref 0.0–40.0)

## 2014-08-20 LAB — HEMOGLOBIN A1C: Hgb A1c MFr Bld: 8.3 % — ABNORMAL HIGH (ref 4.6–6.5)

## 2014-08-20 MED ORDER — LISINOPRIL 10 MG PO TABS: 10.0000 mg | ORAL_TABLET | Freq: Every day | ORAL | Status: DC

## 2014-08-20 MED ORDER — LAMOTRIGINE 25 MG PO TABS: 25.0000 mg | ORAL_TABLET | Freq: Two times a day (BID) | ORAL | Status: DC

## 2014-08-20 NOTE — Addendum Note (Signed)
Addended by: Santiago Bumpers on: 08/20/2014 11:06 AM   Modules accepted: Orders

## 2014-08-20 NOTE — Patient Instructions (Signed)
Diabetes and Standards of Medical Care Diabetes is complicated. You may find that your diabetes team includes a dietitian, nurse, diabetes educator, eye doctor, and more. To help everyone know what is going on and to help you get the care you deserve, the following schedule of care was developed to help keep you on track. Below are the tests, exams, vaccines, medicines, education, and plans you will need. HbA1c test This test shows how well you have controlled your glucose over the past 2-3 months. It is used to see if your diabetes management plan needs to be adjusted.   It is performed at least 2 times a year if you are meeting treatment goals.  It is performed 4 times a year if therapy has changed or if you are not meeting treatment goals. Blood pressure test  This test is performed at every routine medical visit. The goal is less than 140/90 mm Hg for most people, but 130/80 mm Hg in some cases. Ask your health care provider about your goal. Dental exam  Follow up with the dentist regularly. Eye exam  If you are diagnosed with type 1 diabetes as a child, get an exam upon reaching the age of 37 years or older and have had diabetes for 3-5 years. Yearly eye exams are recommended after that initial eye exam.  If you are diagnosed with type 1 diabetes as an adult, get an exam within 5 years of diagnosis and then yearly.  If you are diagnosed with type 2 diabetes, get an exam as soon as possible after the diagnosis and then yearly. Foot care exam  Visual foot exams are performed at every routine medical visit. The exams check for cuts, injuries, or other problems with the feet.  A comprehensive foot exam should be done yearly. This includes visual inspection as well as assessing foot pulses and testing for loss of sensation.  Check your feet nightly for cuts, injuries, or other problems with your feet. Tell your health care provider if anything is not healing. Kidney function test (urine  microalbumin)  This test is performed once a year.  Type 1 diabetes: The first test is performed 5 years after diagnosis.  Type 2 diabetes: The first test is performed at the time of diagnosis.  A serum creatinine and estimated glomerular filtration rate (eGFR) test is done once a year to assess the level of chronic kidney disease (CKD), if present. Lipid profile (cholesterol, HDL, LDL, triglycerides)  Performed every 5 years for most people.  The goal for LDL is less than 100 mg/dL. If you are at high risk, the goal is less than 70 mg/dL.  The goal for HDL is 40 mg/dL-50 mg/dL for men and 50 mg/dL-60 mg/dL for women. An HDL cholesterol of 60 mg/dL or higher gives some protection against heart disease.  The goal for triglycerides is less than 150 mg/dL. Influenza vaccine, pneumococcal vaccine, and hepatitis B vaccine  The influenza vaccine is recommended yearly.  It is recommended that people with diabetes who are over 24 years old get the pneumonia vaccine. In some cases, two separate shots may be given. Ask your health care provider if your pneumonia vaccination is up to date.  The hepatitis B vaccine is also recommended for adults with diabetes. Diabetes self-management education  Education is recommended at diagnosis and ongoing as needed. Treatment plan  Your treatment plan is reviewed at every medical visit. Document Released: 08/23/2009 Document Revised: 03/12/2014 Document Reviewed: 03/28/2013 [facility] Patient Information 2015 [place],  [employer]. This information is not intended to replace advice given to you by your health care provider. Make sure you discuss any questions you have with your health care provider.  

## 2014-08-20 NOTE — Telephone Encounter (Signed)
emmi emailed °

## 2014-08-20 NOTE — Progress Notes (Signed)
Subjective:    Patient ID: Tara Hopkins, female    DOB: 1951/02/12, 63 y.o.   MRN: [id number]  HPI  63 year old African American female, nonsmoker with a history of type 2 diabetes with peripheral vascular disease, hypertension, hyperlipidemia, depression and anxiety. Just completed hyperbaric treatment after a partial amputation of the right foot. It is now healing well. Takes Lantus 30 units in the evenings and use a sliding scale NovoLog along with oral agents. Currently on lisinopril 5 mg once daily. Is requesting to go back on an antidepressant for depression. She has been on multiple agents to include Zoloft, Celexa, Lamictal and Latuda for mood disorder. Unable to afford Latuda although it works best.    Review of Systems  Constitutional: Negative.   HENT: Negative.   Respiratory: Negative.   Cardiovascular: Negative.   Gastrointestinal: Negative.   Endocrine: Negative.   Genitourinary: Negative.   Musculoskeletal: Negative.   Skin: Negative.   Allergic/Immunologic: Negative.   Neurological: Negative.   Hematological: Negative.   Psychiatric/Behavioral: Negative.    Past Medical History  Diagnosis Date  . Diabetes mellitus without complication   . Hypercholesteremia   . Peripheral neuropathy   . Hypertension     not on medications any longer  . Anxiety   . Depression   . History of blood transfusion   . History of hyperbaric oxygen therapy     pt reports 80 treatments.  . Anemia     History   Social History  . Marital Status: Married    Spouse Name: N/A    Number of Children: N/A  . Years of Education: N/A   Occupational History  . Not on file.   Social History Main Topics  . Smoking status: Current Every Day Smoker -- 0.01 packs/day for 20 years    Types: Cigarettes  . Smokeless tobacco: Never Used  . Alcohol Use: No  . Drug Use: No  . Sexual Activity: Not on file   Other Topics Concern  . Not on file   Social History Narrative  . No  narrative on file    Past Surgical History  Procedure Laterality Date  . I&d extremity Right 05/23/2013    Procedure: IRRIGATION AND DEBRIDEMENT RIGHT GREAT TOE;  Surgeon: Mauri Pole, MD;  Location: [facility];  Service: Orthopedics;  Laterality: Right;  . Amputation Right 12/15/2013    Procedure: Right Transmetatarsal Amputation;  Surgeon: Newt Minion, MD;  Location: [facility];  Service: Orthopedics;  Laterality: Right;  . Amputation Right 02/23/2014    Procedure: AMPUTATION FOOT;  Surgeon: Newt Minion, MD;  Location: [facility];  Service: Orthopedics;  Laterality: Right;  Right Foot Revision Transmetatarsal Amputation    No family history on file.  No Known Allergies  Current Outpatient Prescriptions on File Prior to Visit  Medication Sig Dispense Refill  . clonazePAM (KLONOPIN) 0.5 MG tablet take 1 tablet by mouth three times a day if needed  60 tablet  1  . COD LIVER OIL PO Take 1 capsule by mouth daily.      Marland Kitchen gabapentin (NEURONTIN) 300 MG capsule Take 600 mg by mouth at bedtime.      Marland Kitchen glimepiride (AMARYL) 4 MG tablet Take 4 mg by mouth daily before breakfast.      . glucose blood test strip Use as instructed  100 each  12  . latanoprost (XALATAN) 0.005 % ophthalmic solution place 1 drop into both eyes at bedtime  2.5 mL  12  .  ONE TOUCH LANCETS MISC USE TO CHECK BLOOD SUGAR 3 TIMES DAILY  200 each  3  . rosuvastatin (CRESTOR) 10 MG tablet Take 10 mg by mouth daily.      . simvastatin (ZOCOR) 20 MG tablet take 1 tablet by mouth at bedtime  30 tablet  10  . zinc gluconate 50 MG tablet Take 50 mg by mouth 2 (two) times daily.      . insulin aspart (NOVOLOG) 100 UNIT/ML injection Inject 2-6 Units into the skin 3 (three) times daily with meals. Inject 2-6 Units into the skin 15 (fifteen) minutes before meals. Per sliding scale.      . insulin glargine (LANTUS) 100 UNIT/ML injection Inject 30 Units into the skin at bedtime.       . saxagliptin HCl (ONGLYZA) 5 MG TABS tablet Take 5 mg by mouth  daily.       No current facility-administered medications on file prior to visit.    BP 182/100  Pulse 72  Wt 169 lb (76.658 kg)chart    Objective:   Physical Exam  Constitutional: She appears well-developed and well-nourished.  HENT:  Right Ear: External ear normal.  Left Ear: External ear normal.  Nose: Nose normal.  Mouth/Throat: Oropharynx is clear and moist.  Neck: Normal range of motion. Neck supple.  Cardiovascular: Normal rate, regular rhythm and normal heart sounds.   Pulmonary/Chest: Effort normal and breath sounds normal.  Abdominal: Soft. Bowel sounds are normal.  Musculoskeletal: Normal range of motion.  Neurological: She is alert.  Skin: Skin is warm and dry.  Psychiatric: She has a normal mood and affect.          Assessment & Plan:  Jolane was seen today for follow-up.  Diagnoses and associated orders for this visit:  Malignant hypertension  Pure hypercholesterolemia - Lipid Panel - Basic Metabolic Panel - Hepatic Function Panel  Type 2 diabetes with peripheral circulatory disorder, controlled - Hemoglobin A1c  Other Orders - lamoTRIgine (LAMICTAL) 25 MG tablet; Take 1 tablet (25 mg total) by mouth 2 (two) times daily. - Cancel: Hemoglobin A1c - lisinopril (PRINIVIL,ZESTRIL) 10 MG tablet; Take 1 tablet (10 mg total) by mouth daily.    Increase lisinopril to 10 mg once daily. Start Lamictal 25 mg twice a day. Recheck in 3 weeks.

## 2014-08-24 ENCOUNTER — Telehealth: Payer: Self-pay | Admitting: Family

## 2014-08-24 NOTE — Telephone Encounter (Signed)
Pt called and said she need some clarification on something. Tara Hopkins she asked if you would give her a call.

## 2014-08-28 NOTE — Telephone Encounter (Signed)
Pt wanted to discuss kidney function from lab results. Pt has understanding of results

## 2014-08-31 ENCOUNTER — Other Ambulatory Visit: Payer: Self-pay | Admitting: Family

## 2014-09-20 ENCOUNTER — Telehealth: Payer: Self-pay | Admitting: Family

## 2014-09-20 ENCOUNTER — Other Ambulatory Visit: Payer: Self-pay

## 2014-09-20 DIAGNOSIS — F32A Depression, unspecified: Secondary | ICD-10-CM

## 2014-09-20 DIAGNOSIS — F329 Major depressive disorder, single episode, unspecified: Secondary | ICD-10-CM

## 2014-09-20 NOTE — Telephone Encounter (Signed)
Spoke with pt. She is experiencing depression due to be "stuck in the house" since she has been ill and had to have some toes amputated. She reports giving away some of her shoes to her granddaughters because she cannot wear them anymore and this has caused her to become more depressed. She denies any thoughts of suicide, however she does want to talk to someone. She states that she is the oldest of 76 and everyone thinks she is so strong but she does not feel that way right now. I advised that I can place a referral for psychology and she will need to come in the office to see Abby Potash about getting back on Latuda. She said that her former PCP had her taking Latuda, clonazepam and Lamictal but now she is only taking Lamictal and clonazepam. She reports that the clonazepam is not working and she sometimes takes it qid, though it is dosed at tid prn. I have placed a referral for psychology and scheduled an appointment for pt to see Padonda upon her return.

## 2014-09-20 NOTE — Telephone Encounter (Signed)
Pt requesting callback from [facility] regarding request for referral to see a physiatrist.

## 2014-09-28 ENCOUNTER — Telehealth: Payer: Self-pay | Admitting: Family

## 2014-09-28 MED ORDER — GLIMEPIRIDE 4 MG PO TABS: 4.0000 mg | ORAL_TABLET | Freq: Every day | ORAL | Status: DC

## 2014-09-28 MED ORDER — SIMVASTATIN 20 MG PO TABS: 20.0000 mg | ORAL_TABLET | Freq: Every day | ORAL | Status: DC

## 2014-09-28 MED ORDER — SAXAGLIPTIN HCL 5 MG PO TABS: 5.0000 mg | ORAL_TABLET | Freq: Every day | ORAL | Status: DC

## 2014-09-28 MED ORDER — ROSUVASTATIN CALCIUM 10 MG PO TABS: 10.0000 mg | ORAL_TABLET | Freq: Every day | ORAL | Status: DC

## 2014-09-28 MED ORDER — GABAPENTIN 300 MG PO CAPS
600.0000 mg | ORAL_CAPSULE | Freq: Every day | ORAL | Status: DC
Start: 2014-09-28 — End: 2014-10-22

## 2014-09-28 MED ORDER — LISINOPRIL 10 MG PO TABS: 10.0000 mg | ORAL_TABLET | Freq: Every day | ORAL | Status: DC

## 2014-09-28 MED ORDER — LAMOTRIGINE 25 MG PO TABS: 25.0000 mg | ORAL_TABLET | Freq: Two times a day (BID) | ORAL | Status: DC

## 2014-09-28 MED ORDER — INSULIN ASPART 100 UNIT/ML FLEXPEN: PEN_INJECTOR | SUBCUTANEOUS | Status: DC

## 2014-09-28 NOTE — Telephone Encounter (Signed)
Spoke with pt and she would like meds filled for 90 day supplies instead of 30 day supplies.   Done

## 2014-09-28 NOTE — Telephone Encounter (Signed)
Pt ask if you would give her a call . She said she has questions about her rx

## 2014-10-16 ENCOUNTER — Ambulatory Visit: Payer: BC Managed Care – PPO | Admitting: Family

## 2014-10-22 ENCOUNTER — Encounter: Payer: Self-pay | Admitting: Internal Medicine

## 2014-10-22 ENCOUNTER — Encounter: Payer: Self-pay | Admitting: Family

## 2014-10-22 ENCOUNTER — Ambulatory Visit (INDEPENDENT_AMBULATORY_CARE_PROVIDER_SITE_OTHER): Payer: BC Managed Care – PPO | Admitting: Family

## 2014-10-22 VITALS — BP 148/84 | HR 88 | Wt 172.0 lb

## 2014-10-22 DIAGNOSIS — F329 Major depressive disorder, single episode, unspecified: Secondary | ICD-10-CM

## 2014-10-22 DIAGNOSIS — Z1211 Encounter for screening for malignant neoplasm of colon: Secondary | ICD-10-CM

## 2014-10-22 DIAGNOSIS — F32A Depression, unspecified: Secondary | ICD-10-CM

## 2014-10-22 DIAGNOSIS — E1169 Type 2 diabetes mellitus with other specified complication: Secondary | ICD-10-CM

## 2014-10-22 DIAGNOSIS — G629 Polyneuropathy, unspecified: Secondary | ICD-10-CM

## 2014-10-22 MED ORDER — GABAPENTIN 400 MG PO CAPS: 800.0000 mg | ORAL_CAPSULE | Freq: Two times a day (BID) | ORAL | Status: DC

## 2014-10-22 MED ORDER — LAMOTRIGINE 100 MG PO TABS: 50.0000 mg | ORAL_TABLET | Freq: Two times a day (BID) | ORAL | Status: DC

## 2014-10-22 NOTE — Progress Notes (Signed)
Pre visit review using our clinic review tool, if applicable. No additional management support is needed unless otherwise documented below in the visit note. 

## 2014-10-22 NOTE — Progress Notes (Signed)
Subjective:    Patient ID: Tara Hopkins, female    DOB: 1951/06/04, 63 y.o.   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 At her last office visit we started Lamictal 25 mg twice a day. Refills her depression is better but continues to have more bad days than happy. Continues to be tearful multiple times per week. However, her health is improving. Was asked to be a spokesperson for hyperbaric and is excited about that. Has a very supportive family and support system. No feelings of helplessness, hopelessness, thoughts of death or dying.  Continues to have neuropathic pain in her feet. Is currently taking 600 mg of Neurontin twice a day that's helpful but does not get rid of the pain. Rates the pain a 6 out of 10.   Review of Systems  Constitutional: Negative.   HENT: Negative.   Respiratory: Negative.   Cardiovascular: Negative.   Gastrointestinal: Negative.   Endocrine: Negative.   Genitourinary: Negative.   Musculoskeletal: Negative.   Skin: Negative.   Neurological:       Bilateral feet pain related to peripheral neuropathy  Hematological: Negative.   Psychiatric/Behavioral:       Feels down and tearful   Past Medical History  Diagnosis Date  . Diabetes mellitus without complication   . Hypercholesteremia   . Peripheral neuropathy   . Hypertension     not on medications any longer  . Anxiety   . Depression   . History of blood transfusion   . History of hyperbaric oxygen therapy     pt reports 80 treatments.  . Anemia     History   Social History  . Marital Status: Married    Spouse Name: N/A    Number of Children: N/A  . Years of Education: N/A   Occupational History  . Not on file.   Social History Main Topics  . Smoking status: Current Every  Day Smoker -- 0.01 packs/day for 20 years    Types: Cigarettes  . Smokeless tobacco: Never Used  . Alcohol Use: No  . Drug Use: No  . Sexual Activity: Not on file   Other Topics Concern  . Not on file   Social History Narrative    Past Surgical History  Procedure Laterality Date  . I&d extremity Right 05/23/2013    Procedure: IRRIGATION AND DEBRIDEMENT RIGHT GREAT TOE;  Surgeon: Mauri Pole, MD;  Location: [facility];  Service: Orthopedics;  Laterality: Right;  . Amputation Right 12/15/2013    Procedure: Right Transmetatarsal Amputation;  Surgeon: Newt Minion, MD;  Location: [place];  Service: Orthopedics;  Laterality: Right;  . Amputation Right 02/23/2014    Procedure: AMPUTATION FOOT;  Surgeon: Newt Minion, MD;  Location: [place];  Service: Orthopedics;  Laterality: Right;  Right Foot Revision Transmetatarsal Amputation    No family history on file.  No Known Allergies  Current Outpatient Prescriptions on File Prior to Visit  Medication Sig Dispense Refill  . clonazePAM (KLONOPIN) 0.5 MG tablet take 1 tablet by mouth three times a day if needed 60 tablet 1  . COD LIVER OIL PO Take 1 capsule by mouth daily.    Marland Kitchen FLUVIRIN 0.5 ML SUSY   0  . glimepiride (AMARYL) 4 MG tablet Take 1 tablet (4 mg total) by mouth daily before breakfast. 90  tablet 0  . glucose blood test strip Use as instructed 100 each 12  . insulin aspart (NOVOLOG FLEXPEN) 100 UNIT/ML FlexPen Inject 2-6 units into skin 3 times daily 15 mL 0  . insulin glargine (LANTUS) 100 UNIT/ML injection Inject 30 Units into the skin at bedtime.     Marland Kitchen latanoprost (XALATAN) 0.005 % ophthalmic solution place 1 drop into both eyes at bedtime 2.5 mL 12  . lisinopril (PRINIVIL,ZESTRIL) 10 MG tablet Take 1 tablet (10 mg total) by mouth daily. 90 tablet 0  . ONE TOUCH LANCETS MISC USE TO CHECK BLOOD SUGAR 3 TIMES DAILY 200 each 3  . rosuvastatin (CRESTOR) 10 MG tablet Take 1 tablet (10 mg total) by mouth daily. 90 tablet 0  . saxagliptin  HCl (ONGLYZA) 5 MG TABS tablet Take 1 tablet (5 mg total) by mouth daily. 90 tablet 0  . simvastatin (ZOCOR) 20 MG tablet Take 1 tablet (20 mg total) by mouth at bedtime. 90 tablet 0  . zinc gluconate 50 MG tablet Take 50 mg by mouth 2 (two) times daily.     No current facility-administered medications on file prior to visit.    BP 148/84 mmHg  Pulse 88  Wt 172 lb (78.019 kg)chart    Objective:   Physical Exam  Constitutional: She is oriented to person, place, and time. She appears well-developed and well-nourished.  HENT:  Right Ear: External ear normal.  Left Ear: External ear normal.  Nose: Nose normal.  Mouth/Throat: Oropharynx is clear and moist.  Neck: Normal range of motion. Neck supple.  Cardiovascular: Normal rate, regular rhythm and normal heart sounds.   Pulmonary/Chest: Effort normal and breath sounds normal.  Abdominal: Soft. Bowel sounds are normal.  Musculoskeletal: Normal range of motion.  Neurological: She is alert and oriented to person, place, and time.  Skin: Skin is warm and dry.  Psychiatric: She has a normal mood and affect.          Assessment & Plan:  Tara Hopkins was seen today for depression.  Diagnoses and associated orders for this visit:  Depression  Screen for colon cancer - Ambulatory referral to Gastroenterology  Type 2 diabetes mellitus with other specified complication  Peripheral neuropathy  Other Orders - lamoTRIgine (LAMICTAL) 100 MG tablet; Take 0.5 tablets (50 mg total) by mouth 2 (two) times daily. - gabapentin (NEURONTIN) 400 MG capsule; Take 2 capsules (800 mg total) by mouth 2 (two) times daily.    Encouraged healthy diet, exercise. Increase Lamictal to 50 mg twice a day. Increase Neurontin to 800 mg twice a day. Recheck in 4 weeks.

## 2014-10-22 NOTE — Patient Instructions (Signed)

## 2014-10-30 ENCOUNTER — Other Ambulatory Visit: Payer: Self-pay | Admitting: Family

## 2014-11-06 ENCOUNTER — Telehealth: Payer: Self-pay | Admitting: Family

## 2014-11-06 NOTE — Telephone Encounter (Signed)
Patient states she asked Padonda for a referral to see a therapist.  She was calling to follow up on an appointment. Please advise.

## 2014-11-06 NOTE — Telephone Encounter (Signed)
Patient is aware 

## 2014-11-06 NOTE — Telephone Encounter (Signed)
She does not need a referral to see a  Therapist. Just call and schedule an appt. Consider Richardo Priest.

## 2014-11-06 NOTE — Telephone Encounter (Signed)
Please advise 

## 2014-11-14 ENCOUNTER — Telehealth: Payer: Self-pay | Admitting: Family

## 2014-11-14 ENCOUNTER — Ambulatory Visit: Payer: BC Managed Care – PPO | Admitting: Licensed Clinical Social Worker

## 2014-11-14 NOTE — Telephone Encounter (Signed)
Tara Hopkins came in today to see Richardo Priest and stop by to request a refill on her medication.

## 2014-11-14 NOTE — Telephone Encounter (Signed)
What medication

## 2014-11-15 NOTE — Telephone Encounter (Signed)
I called the patient and LMOM for her to call back and advise [place] what medication she needs refilled.

## 2014-11-15 NOTE — Telephone Encounter (Signed)
Called pt. She states that she asked Padonda to increase of her clonazepam but it was the same dosage when she picked it up from the pharmacy. Advised that, per Padonda's note, her Lamictal and Neurontin were increased but there is no mention of clonazepam. Advised that I will forward message to [facility] for review. Pt is aware that PCP out of the office until Monday

## 2014-11-19 NOTE — Telephone Encounter (Signed)
We increased Lamictal not Lorazepam.

## 2014-11-19 NOTE — Telephone Encounter (Signed)
Pt aware and verbalized understanding.  

## 2014-11-20 ENCOUNTER — Ambulatory Visit: Payer: BC Managed Care – PPO | Admitting: Licensed Clinical Social Worker

## 2014-11-20 ENCOUNTER — Ambulatory Visit: Payer: BC Managed Care – PPO | Admitting: Family

## 2014-12-04 ENCOUNTER — Telehealth: Payer: Self-pay | Admitting: Family

## 2014-12-04 MED ORDER — INSULIN GLARGINE 100 UNIT/ML ~~LOC~~ SOLN: 30.0000 [IU] | Freq: Every day | SUBCUTANEOUS | Status: DC

## 2014-12-04 NOTE — Telephone Encounter (Signed)
Pt request refill of the following:   insulin glargine (LANTUS) 100 UNIT/ML injection    Phamacy: [facility]

## 2014-12-04 NOTE — Telephone Encounter (Signed)
Done

## 2014-12-10 ENCOUNTER — Ambulatory Visit (INDEPENDENT_AMBULATORY_CARE_PROVIDER_SITE_OTHER): Payer: BC Managed Care – PPO | Admitting: Family

## 2014-12-10 ENCOUNTER — Encounter: Payer: Self-pay | Admitting: Family

## 2014-12-10 VITALS — BP 132/86 | HR 89 | Temp 97.4°F | Wt 174.3 lb

## 2014-12-10 DIAGNOSIS — Z89431 Acquired absence of right foot: Secondary | ICD-10-CM

## 2014-12-10 DIAGNOSIS — E1165 Type 2 diabetes mellitus with hyperglycemia: Secondary | ICD-10-CM

## 2014-12-10 DIAGNOSIS — I10 Essential (primary) hypertension: Secondary | ICD-10-CM

## 2014-12-10 DIAGNOSIS — E785 Hyperlipidemia, unspecified: Secondary | ICD-10-CM

## 2014-12-10 DIAGNOSIS — IMO0002 Reserved for concepts with insufficient information to code with codable children: Secondary | ICD-10-CM

## 2014-12-10 NOTE — Patient Instructions (Signed)
Diabetes and Exercise Diabetes mellitus is a common, chronic disease, in which the pancreas is unable to adequately control blood glucose (sugar) levels. There are 2 types of diabetes. Type 1 diabetes patients are unable to produce insulin, a hormone that causes sugar in the blood to be stored in the body. People with type 1 diabetes may compensate by giving themselves injections of insulin. Type 2 diabetes involves not producing adequate amounts of insulin to control blood glucose levels. People with type 2 diabetes control their blood glucose by monitoring their food intake or by taking medicine. Exercise is an important part of diabetes treatment. During exercise, the muscles use a greater amount of glucose from the blood for energy. This lowers your blood glucose, which is the same effect you would get from taking insulin. It has been shown that endurance athletes are more sensitive to insulin than inactive people. SYMPTOMS  Many people with a mild case of diabetes have no symptoms. However, if left uncontrolled, diabetes can lead to several complications that could be prevented with treatment of the disease. General symptoms of diabetes include:   Frequent urination (polyuria).  Frequent thirst and drinking (polydipsia).  Increased food consumption (polyphagia).  Fatigue.  Poor exercise performance.  Blurred vision.  Inflammation of the vagina (vaginitis) caused by fungal infections.  Skin infections (uncommon).  Numbness in the feet, caused by nerve injury.  Kidney disease. CAUSES  The cause of most cases of diabetes is unknown. In children, diabetes is often due to an autoimmune response to the cells in the pancreas that make insulin. It is also linked with other diseases, such as cystic fibrosis. Diabetes may have a genetic link. PREVENTION  Athletes should strive to begin exercise with blood glucose in a well-controlled state.  Feet should always be kept clean and  dry.  Activities in which low blood sugar levels cannot be treated easily (scuba diving, rock climbing, swimming) should be avoided.  Anticipate alterations in diet or training to avoid low blood sugar (hypoglycemia) and high blood sugar (hyperglycemia).  Athletes should try to increase sugar consumption after strenuous exercise to avoid hypoglycemia.  Short-acting insulin should not be injected into an actively exercising muscle. The athlete should rest the injection site for about 1 hour after exercise.  Patients with diabetes should get routine checkups of the feet to prevent complications. PROGNOSIS  Exercise provides many benefits to the person with diabetes:   Reduced body fat.  Lower blood pressure.  Often, reduced need for medicines.  Improved exercise tolerance.  Lower insulin levels.  Weight loss.  Improved lipid profile (decreased cholesterol and low-density lipoproteins). RELATED COMPLICATIONS  If performed incorrectly, exercise can result in complications of diabetes:   Poor control of blood sugar, when exercise is performed at the wrong time.  Increase in renal disease, from loss of body fluids (dehydration).  Increased risk of nerve injury (neuropathy) when performing exercises that increase foot injury.  Increased risk of eye problems when performing activities that involve breath holding or lowering or jarring the head.  Increased risk of sudden death from exercise in patients with heart disease.  Worsening of hypertension with heavy lifting (more than 10 lb/4.5 kg). Altered blood glucose and insulin dose as a result of mild illness that produces loss of appetite.  Altered uptake of insulin after injection when insulin injection site is changed. NOTE: Exercise can lower blood glucose effectively, but the effects are short-lasting (no more than a couple of days). Exercise has been shown to  improve your sensitivity to insulin. This may alter how your body  responds to a given dose of injected insulin. It is important for every patient with diabetes to know how his or her body may react to exercise, and to adjust insulin dosages accordingly. TREATMENT  Eat about 1 to 3 hours before exercise.  Check blood glucose immediately before and after exercise.  Stop exercise if blood glucose is more than 250 mg/dL.  Stop exercise if blood glucose is less than 100 mg/dL.  Do not exercise within 1 hour of an insulin injection.  Be prepared to treat low blood glucose while exercising. Keep some sugar product with you, such as a candy bar.  For prolonged exercise, use a sports drink to maintain your glucose level.  Replace used-up glucose in the body after exercise.  Consume fluids during and after exercise to avoid dehydration. SEEK MEDICAL CARE IF:  You have vision changes after a run.  You notice a loss of sensation in your feet after exercise.  You have increased numbness, tingling, or pins and needles sensations after exercise.  You have chest pain during or after exercise.  You have a fast, irregular heartbeat (palpitations) during or after exercise.  Your exercise tolerance gets worse.  You have fainting or dizzy spells for brief periods during or after exercise. Document Released: 10/26/2005 Document Revised: 03/12/2014 Document Reviewed: 02/07/2009 [facility] Patient Information 2015 [place], [place]. This information is not intended to replace advice given to you by your health care provider. Make sure you discuss any questions you have with your health care provider.

## 2014-12-10 NOTE — Progress Notes (Signed)
Pre visit review using our clinic review tool, if applicable. No additional management support is needed unless otherwise documented below in the visit note. 

## 2014-12-10 NOTE — Progress Notes (Signed)
Subjective:    Patient ID: Tara Hopkins, female    DOB: 1951-09-16, 64 y.o.   MRN: [id number]  HPI 64 year old African-American female, nonsmoker with a history of type 2 diabetes, hypertension, hyperlipidemia is in today for recheck. Reports doing well. Blood glucose range between 130s to 160s with occasional spikes in the 200s for which she takes Humalog as needed. She has a right partial foot amputation. Learning to walk independently again. Plans to join Silver sneakers. Tolerates medication well. Performs nightly feet checks. Diabetic eye exam up-to-date.   Review of Systems  Constitutional: Negative.   HENT: Negative.   Respiratory: Negative.   Cardiovascular: Negative.   Gastrointestinal: Negative.   Endocrine: Negative.   Genitourinary: Negative.   Musculoskeletal: Negative.   Skin: Negative.   Allergic/Immunologic: Negative.   Neurological: Negative.   Hematological: Negative.   Psychiatric/Behavioral: Negative.    Past Medical History  Diagnosis Date  . Diabetes mellitus without complication   . Hypercholesteremia   . Peripheral neuropathy   . Hypertension     not on medications any longer  . Anxiety   . Depression   . History of blood transfusion   . History of hyperbaric oxygen therapy     pt reports 80 treatments.  . Anemia     History   Social History  . Marital Status: Married    Spouse Name: N/A    Number of Children: N/A  . Years of Education: N/A   Occupational History  . Not on file.   Social History Main Topics  . Smoking status: Current Every Day Smoker -- 0.01 packs/day for 20 years    Types: Cigarettes  . Smokeless tobacco: Never Used  . Alcohol Use: No  . Drug Use: No  . Sexual Activity: Not on file   Other Topics Concern  . Not on file   Social History Narrative    Past Surgical History  Procedure Laterality Date  . I&d extremity Right 05/23/2013    Procedure: IRRIGATION AND DEBRIDEMENT RIGHT GREAT TOE;  Surgeon: Mauri Pole, MD;  Location: [facility];  Service: Orthopedics;  Laterality: Right;  . Amputation Right 12/15/2013    Procedure: Right Transmetatarsal Amputation;  Surgeon: Newt Minion, MD;  Location: [place];  Service: Orthopedics;  Laterality: Right;  . Amputation Right 02/23/2014    Procedure: AMPUTATION FOOT;  Surgeon: Newt Minion, MD;  Location: [place];  Service: Orthopedics;  Laterality: Right;  Right Foot Revision Transmetatarsal Amputation    History reviewed. No pertinent family history.  No Known Allergies  Current Outpatient Prescriptions on File Prior to Visit  Medication Sig Dispense Refill  . clonazePAM (KLONOPIN) 0.5 MG tablet take 1 tablet by mouth three times a day if needed 60 tablet 1  . COD LIVER OIL PO Take 1 capsule by mouth daily.    Marland Kitchen FLUVIRIN 0.5 ML SUSY   0  . gabapentin (NEURONTIN) 400 MG capsule Take 2 capsules (800 mg total) by mouth 2 (two) times daily. 120 capsule 4  . glimepiride (AMARYL) 4 MG tablet Take 1 tablet (4 mg total) by mouth daily before breakfast. 90 tablet 0  . glucose blood test strip Use as instructed 100 each 12  . insulin aspart (NOVOLOG FLEXPEN) 100 UNIT/ML FlexPen Inject 2-6 units into skin 3 times daily 15 mL 0  . insulin glargine (LANTUS) 100 UNIT/ML injection Inject 0.3 mLs (30 Units total) into the skin at bedtime. 10 mL 2  . lamoTRIgine (LAMICTAL) 100  MG tablet Take 0.5 tablets (50 mg total) by mouth 2 (two) times daily. 30 tablet 3  . latanoprost (XALATAN) 0.005 % ophthalmic solution place 1 drop into both eyes at bedtime 2.5 mL 12  . lisinopril (PRINIVIL,ZESTRIL) 10 MG tablet Take 1 tablet (10 mg total) by mouth daily. 90 tablet 0  . ONE TOUCH LANCETS MISC USE TO CHECK BLOOD SUGAR 3 TIMES DAILY 200 each 3  . rosuvastatin (CRESTOR) 10 MG tablet Take 1 tablet (10 mg total) by mouth daily. 90 tablet 0  . saxagliptin HCl (ONGLYZA) 5 MG TABS tablet Take 1 tablet (5 mg total) by mouth daily. 90 tablet 0  . simvastatin (ZOCOR) 20 MG tablet Take 1  tablet (20 mg total) by mouth at bedtime. 90 tablet 0  . zinc gluconate 50 MG tablet Take 50 mg by mouth 2 (two) times daily.     No current facility-administered medications on file prior to visit.    BP 132/86 mmHg  Pulse 89  Temp(Src) 97.4 F (36.3 C) (Oral)  Wt 174 lb 4.8 oz (79.062 kg)chart    Objective:   Physical Exam  Constitutional: She is oriented to person, place, and time. She appears well-developed and well-nourished.  HENT:  Right Ear: External ear normal.  Left Ear: External ear normal.  Nose: Nose normal.  Mouth/Throat: Oropharynx is clear and moist.  Eyes: Pupils are equal, round, and reactive to light.  Neck: Normal range of motion. Neck supple. No thyromegaly present.  Cardiovascular: Normal rate, regular rhythm and normal heart sounds.   Pulmonary/Chest: Effort normal and breath sounds normal.  Abdominal: Soft. Bowel sounds are normal.  Musculoskeletal: Normal range of motion.  Neurological: She is alert and oriented to person, place, and time.  Skin: Skin is warm and dry.  Psychiatric: She has a normal mood and affect.          Assessment & Plan:  Tara Hopkins was seen today for follow-up.  Diagnoses and associated orders for this visit:  Diabetes type 2, uncontrolled - Hemoglobin A1c - Hepatic Function Panel - Microalbumin/Creatinine Ratio, Urine  Essential hypertension - Basic Metabolic Panel - Hepatic Function Panel  Hyperlipidemia - Hepatic Function Panel - Lipid Panel  Partial nontraumatic amputation of right foot    Continue current medications. Labs obtained today. Will notify patient pending results. Recheck in 3 months.

## 2014-12-14 ENCOUNTER — Encounter (HOSPITAL_COMMUNITY): Payer: Self-pay

## 2014-12-14 ENCOUNTER — Emergency Department (HOSPITAL_COMMUNITY)
Admission: EM | Admit: 2014-12-14 | Discharge: 2014-12-14 | Disposition: A | Discharge status: Home / Self Care | Payer: BC Managed Care – PPO | Attending: Emergency Medicine | Admitting: Emergency Medicine

## 2014-12-14 DIAGNOSIS — G629 Polyneuropathy, unspecified: Secondary | ICD-10-CM | POA: Diagnosis not present

## 2014-12-14 DIAGNOSIS — F419 Anxiety disorder, unspecified: Secondary | ICD-10-CM | POA: Diagnosis not present

## 2014-12-14 DIAGNOSIS — L988 Other specified disorders of the skin and subcutaneous tissue: Secondary | ICD-10-CM | POA: Insufficient documentation

## 2014-12-14 DIAGNOSIS — S80821A Blister (nonthermal), right lower leg, initial encounter: Secondary | ICD-10-CM

## 2014-12-14 DIAGNOSIS — Z720 Tobacco use: Secondary | ICD-10-CM | POA: Diagnosis not present

## 2014-12-14 DIAGNOSIS — E119 Type 2 diabetes mellitus without complications: Secondary | ICD-10-CM | POA: Insufficient documentation

## 2014-12-14 DIAGNOSIS — E780 Pure hypercholesterolemia: Secondary | ICD-10-CM | POA: Diagnosis not present

## 2014-12-14 DIAGNOSIS — I10 Essential (primary) hypertension: Secondary | ICD-10-CM | POA: Insufficient documentation

## 2014-12-14 DIAGNOSIS — F329 Major depressive disorder, single episode, unspecified: Secondary | ICD-10-CM | POA: Diagnosis not present

## 2014-12-14 DIAGNOSIS — Z89431 Acquired absence of right foot: Secondary | ICD-10-CM | POA: Diagnosis not present

## 2014-12-14 DIAGNOSIS — M79671 Pain in right foot: Secondary | ICD-10-CM | POA: Diagnosis present

## 2014-12-14 DIAGNOSIS — Z79899 Other long term (current) drug therapy: Secondary | ICD-10-CM | POA: Diagnosis not present

## 2014-12-14 DIAGNOSIS — Z794 Long term (current) use of insulin: Secondary | ICD-10-CM | POA: Insufficient documentation

## 2014-12-14 DIAGNOSIS — Z862 Personal history of diseases of the blood and blood-forming organs and certain disorders involving the immune mechanism: Secondary | ICD-10-CM | POA: Insufficient documentation

## 2014-12-14 MED ORDER — BACITRACIN ZINC 500 UNIT/GM EX OINT
TOPICAL_OINTMENT | CUTANEOUS | Status: AC
  Administered 2014-12-14: 1
  Filled 2014-12-14: qty 0.9

## 2014-12-14 NOTE — ED Provider Notes (Signed)
CSN: [id number]     Arrival date & time 12/14/14  0001 History   First MD Initiated Contact with Patient 12/14/14 0100     Chief Complaint  Patient presents with  . Foot Pain     (Consider location/radiation/quality/duration/timing/severity/associated sxs/prior Treatment) Patient is a 64 y.o. female presenting with lower extremity pain. The history is provided by the patient. No language interpreter was used.  Foot Pain This is a new problem. Pertinent negatives include no chest pain, fever, myalgias or numbness. Associated symptoms comments: The patient has a history of right forefoot amputation secondary to nonhealing infection (12/2013). She presents tonight with complaint of painless blister she just noticed to right lower leg. No redness, pain, drainage or swelling..    Past Medical History  Diagnosis Date  . Diabetes mellitus without complication   . Hypercholesteremia   . Peripheral neuropathy   . Hypertension     not on medications any longer  . Anxiety   . Depression   . History of blood transfusion   . History of hyperbaric oxygen therapy     pt reports 80 treatments.  . Anemia    Past Surgical History  Procedure Laterality Date  . I&d extremity Right 05/23/2013    Procedure: IRRIGATION AND DEBRIDEMENT RIGHT GREAT TOE;  Surgeon: Mauri Pole, MD;  Location: [facility];  Service: Orthopedics;  Laterality: Right;  . Amputation Right 12/15/2013    Procedure: Right Transmetatarsal Amputation;  Surgeon: Newt Minion, MD;  Location: [place];  Service: Orthopedics;  Laterality: Right;  . Amputation Right 02/23/2014    Procedure: AMPUTATION FOOT;  Surgeon: Newt Minion, MD;  Location: [place];  Service: Orthopedics;  Laterality: Right;  Right Foot Revision Transmetatarsal Amputation   History reviewed. No pertinent family history. History  Substance Use Topics  . Smoking status: Current Every Day Smoker -- 0.01 packs/day for 20 years    Types: Cigarettes  . Smokeless tobacco:  Never Used  . Alcohol Use: No   OB History    No data available     Review of Systems  Constitutional: Negative for fever.  Respiratory: Negative for shortness of breath.   Cardiovascular: Negative for chest pain.  Musculoskeletal: Negative for myalgias.  Skin: Positive for wound.  Neurological: Negative for numbness.      Allergies  Review of patient's allergies indicates no known allergies.  Home Medications   Prior to Admission medications   Medication Sig Start Date End Date Taking? Authorizing Provider  acetaminophen (TYLENOL) 500 MG tablet Take 1,000 mg by mouth every 6 (six) hours as needed for headache.   Yes Historical Provider, MD  clonazePAM (KLONOPIN) 0.5 MG tablet take 1 tablet by mouth three times a day if needed 10/30/14  Yes Timoteo Gaul, FNP  COD LIVER OIL PO Take 1 capsule by mouth daily.   Yes Historical Provider, MD  gabapentin (NEURONTIN) 400 MG capsule Take 2 capsules (800 mg total) by mouth 2 (two) times daily. 10/22/14  Yes Timoteo Gaul, FNP  glimepiride (AMARYL) 4 MG tablet Take 1 tablet (4 mg total) by mouth daily before breakfast. 09/28/14  Yes Timoteo Gaul, FNP  insulin aspart (NOVOLOG FLEXPEN) 100 UNIT/ML FlexPen Inject 2-6 units into skin 3 times daily 09/28/14  Yes Timoteo Gaul, FNP  insulin glargine (LANTUS) 100 UNIT/ML injection Inject 0.3 mLs (30 Units total) into the skin at bedtime. Patient taking differently: Inject 33 Units into the skin at bedtime.  12/04/14  Yes Padonda  Honor Junes, FNP  lamoTRIgine (LAMICTAL) 100 MG tablet Take 0.5 tablets (50 mg total) by mouth 2 (two) times daily. 10/22/14  Yes Timoteo Gaul, FNP  latanoprost (XALATAN) 0.005 % ophthalmic solution place 1 drop into both eyes at bedtime 08/06/14  Yes Timoteo Gaul, FNP  lisinopril (PRINIVIL,ZESTRIL) 10 MG tablet Take 1 tablet (10 mg total) by mouth daily. 09/28/14  Yes Timoteo Gaul, FNP  rosuvastatin (CRESTOR) 10 MG tablet Take 1 tablet  (10 mg total) by mouth daily. 09/28/14  Yes Timoteo Gaul, FNP  saxagliptin HCl (ONGLYZA) 5 MG TABS tablet Take 1 tablet (5 mg total) by mouth daily. 09/28/14  Yes Timoteo Gaul, FNP  simvastatin (ZOCOR) 20 MG tablet Take 1 tablet (20 mg total) by mouth at bedtime. 09/28/14  Yes Timoteo Gaul, FNP  white petrolatum (VASELINE) GEL Apply 1 application topically at bedtime. To both feet   Yes Historical Provider, MD  zinc gluconate 50 MG tablet Take 50 mg by mouth 2 (two) times daily.   Yes Historical Provider, MD  FLUVIRIN 0.5 ML SUSY  09/03/14   Historical Provider, MD  glucose blood test strip Use as instructed 06/01/14   Timoteo Gaul, FNP  ONE TOUCH LANCETS MISC USE TO CHECK BLOOD SUGAR 3 TIMES DAILY 06/01/14   Timoteo Gaul, FNP   BP 173/83 mmHg  Pulse 90  Temp(Src) 97.8 F (36.6 C) (Oral)  Resp 18  Ht 5\' 5"  (1.651 m)  Wt 174 lb (78.926 kg)  BMI 28.96 kg/m2  SpO2 100% Physical Exam  Constitutional: She is oriented to person, place, and time. She appears well-developed and well-nourished. No distress.  Musculoskeletal:  No swelling, discoloration or tenderness of right lower leg. S/P forefoot amputation right foot, well healed, non-tender, non-erythematous.   Neurological: She is alert and oriented to person, place, and time.  Skin:  1 cm diameter, circular blister with clear intralesional fluid. No pus, redness, tenderness or surrounding swelling.     ED Course  Procedures (including critical care time) Labs Review Labs Reviewed - No data to display  Imaging Review No results found.   EKG Interpretation None      MDM   Final diagnoses:  Blister of leg without infection, right, initial encounter    The patient is encouraged to clean the wound twice daily, apply topical abx, keep covered and have close follow up for recheck with PCP. Return precautions provided. No evidence of infection at this time. VSS.     Dewaine Oats, PA-C 12/14/14  [id number]  Kalman Drape, MD 12/14/14 [phone]

## 2014-12-14 NOTE — ED Notes (Signed)
Pt is a diabetic and has half of her foot amputated, she thinks she has another ulcer on this same foot, she can't see her wound doctor until next week

## 2014-12-14 NOTE — Discharge Instructions (Signed)
    Blisters Blisters are fluid-filled sacs that form within the skin. Common causes of blistering are friction, burns, and exposure to irritating chemicals. The fluid in the blister protects the underlying damaged skin. Most of the time it is not recommended that you open blisters. When a blister is opened, there is an increased chance for infection. Usually, a blister will open on its own. They then dry up and peel off within 10 days. If the blister is tense and uncomfortable (painful) the fluid may be drained. If it is drained the roof of the blister should be left intact. The draining should only be done by a medical professional under aseptic conditions. Poorly fitting shoes and boots can cause blisters by being too tight or too loose. Wearing extra socks or using tape, bandages, or pads over the blister-prone area helps prevent the problem by reducing friction. Blisters heal more slowly if you have diabetes or if you have problems with your circulation. You need to be careful about medical follow-up to prevent infection. HOME CARE INSTRUCTIONS  Protect areas where blisters have formed until the skin is healed. Use a special bandage with a hole cut in the middle around the blister. This reduces pressure and friction. When the blister breaks, trim off the loose skin and keep the area clean by washing it with soap daily. Soaking the blister or broken-open blister with diluted vinegar twice daily for 15 minutes will dry it up and speed the healing. Use 3 tablespoons of white vinegar per quart of water (45 mL white vinegar per liter of water). An antibiotic ointment and a bandage can be used to cover the area after soaking.  SEEK MEDICAL CARE IF:   You develop increased redness, pain, swelling, or drainage in the blistered area.  You develop a pus-like discharge from the blistered area, chills, or a fever. MAKE SURE YOU:   Understand these instructions.  Will watch your condition.  Will get help  right away if you are not doing well or get worse. Document Released: 12/03/2004 Document Revised: 01/18/2012 Document Reviewed: 10/31/2008 Exit[employer] Patient Information 2015 [employer]. This information is not intended to replace advice given to you by your health care provider. Make sure you discuss any questions you have with your health care provider.  

## 2014-12-19 ENCOUNTER — Encounter (HOSPITAL_BASED_OUTPATIENT_CLINIC_OR_DEPARTMENT_OTHER): Payer: BC Managed Care – PPO | Attending: Surgery

## 2014-12-19 DIAGNOSIS — X102XXA Contact with fats and cooking oils, initial encounter: Secondary | ICD-10-CM | POA: Diagnosis not present

## 2014-12-19 DIAGNOSIS — E119 Type 2 diabetes mellitus without complications: Secondary | ICD-10-CM | POA: Diagnosis not present

## 2014-12-19 DIAGNOSIS — I10 Essential (primary) hypertension: Secondary | ICD-10-CM | POA: Insufficient documentation

## 2014-12-19 DIAGNOSIS — Z89431 Acquired absence of right foot: Secondary | ICD-10-CM | POA: Diagnosis not present

## 2014-12-19 DIAGNOSIS — T24101A Burn of first degree of unspecified site of right lower limb, except ankle and foot, initial encounter: Secondary | ICD-10-CM | POA: Diagnosis not present

## 2014-12-20 ENCOUNTER — Telehealth: Payer: Self-pay | Admitting: Family

## 2014-12-20 NOTE — Telephone Encounter (Signed)
Spoke with pt. She wanted to let us know that she has been falling and was told by her daughter that she needs to slow down because she is trying to move too fast. Pt has had foot amputation and should be using cane or walker but hasn't been. She states that her daughter bought her a cane and got her walker out of storage. She said that she will slow down, use her walker, cane and begin exercising to lose weight. She will let us know if she continues to fall or develops any other sxs.

## 2014-12-20 NOTE — Telephone Encounter (Signed)
Pt would like you to call her concerning some issues she is having. Pt refused elaborate, except she has been falling and wants to speak w/ you.

## 2014-12-21 NOTE — H&P (Signed)
NAME:  Tara Hopkins, Tara Hopkins                 ACCOUNT NO.:  MEDICAL RECORD NO.:  [id number]  LOCATION:                                 FACILITY:  PHYSICIAN:  Christin Fudge, MD       DATE OF BIRTH:  05-May-1951  DATE OF ADMISSION: DATE OF DISCHARGE:                             HISTORY & PHYSICAL   Chief complaint:  This is a pleasant 64 year old patient, who is very well known to the wound center comes with an unrelated problem. She is having a blister on the right lower extremity on the posterior aspect for  about a week.  HISTORY OF PRESENT ILLNESS:  The patient says she does not know how exactly this occurred, but last week she was cooking a lot due to some family coming to visit and was in shorts and feels that she may have had burn due to hot oil, which may have fallen on her right lower extremity.  The next thing she noticed was a blister there which popped by itself.  Being very aware of her problems with wounds in the past, she wanted to have a look and went and saw the ER physicians, who recommended she come and see the wound care physician.  Other than that, she has no acute problems.  PAST MEDICAL HISTORY:  Diabetes mellitus, hypercholesterolemia, peripheral neuropathy, hypertension, and anxiety.  PAST SURGICAL HISTORY:  She has had several surgeries to her right lower extremity including right great toe and then ultimately had a transmetatarsal amputation on the right side, which was done in February and then reamputated in April 2015 and the surgeon was Dr. Sharol Given.  Since then, she has had prolonged treatment  in the wound center and included hyperbaric oxygen.  SOCIAL HISTORY:  She has been a smoker for several years and has a few cigarettes every day.  Does not drink alcohol.  FAMILY HISTORY:  History of diabetes mellitus.  ALLERGIES:  NKDA.  MEDICATIONS: 1. I have reviewed her list of her medications, which include Tylenol,     Klonopin, cod liver oil, Neurontin,  Amaryl, NovoLog insulin,     Lamictal, and Xalatan. 2. Prinivil. 3. Crestor. 4. Onglyza. 5. Zocor.  REVIEW OF SYSTEMS:  Review of systems has been done and I have reviewed the nursing notes and all 12 systems have been reviewed and except for the skin ulceration on the right lower extremity, she has no other significant problems.  PHYSICAL EXAMINATION:  GENERAL:  On clinical examination, this is a very pleasant lady laying comfortably in bed. VITAL SIGNS:  Stable, and she is afebrile.  She had a weight of 174 pounds.  Her height has been noted to be 5 feet 5 inches, blood pressure is 184/90, respirations 18, and pulse 77. HEENT:  Oral cavity is normal. NECK:  Supple.  Sclerae normal. CVS:  Normal heart sounds.  RS air entry, equal at both bases. ABDOMEN:  Soft and nontender.  No masses palpable. EXTREMITIES:  Reveals that the left extremity is normal.  The right lower extremity, she has transmetatarsal amputation done and the wound is well healed.  There are no ulcerations.  On the posterior part of  the right lower extremity, she has a small area which is approximately 1 cm in  diameter, which has some necrotic skin over this.  Other than that, surrounding skin is okay.  She will need a brief procedure as detailed below.  PROCEDURE:  After adequate anesthesia and appropriate time out, the skin over this ulcerated area was debrided and this is a selective debridement of necrotic  skin.  Under this, there is an area of 1 cm in diameter, which has good dermis and the wound is clean.  There are no other problems.  Peripheral pulses are normal.  No other investigations are available at the present time.  CLINICAL IMPRESSION: 1. One cm superficial grade 1 skin burn possibly due to hot oil on the     posterior part of the right lower extremity. 2. Diabetes mellitus. 3. Hypertension. 4. Status post right foot transmetatarsal amputation.  PLAN AND RECOMMENDATIONS:  I have reassured  the patient that this is a fairly superficial bone, which could be treated conservatively.  She has a lot of silver alginate at home,  and hence I have asked to put up a small piece of silver alginate and covered it with a border and applied a light wrap.  She is agreeable about this.  I have instructed her to continue watching her sugar and make sure that she is well controlled with her diabetes.  All questions have been answered and she will come back and see [place] next week.          ______________________________ Christin Fudge, MD     EB/MEDQ  D:  12/19/2014  T:  12/20/2014  Job:  [id number]  cc:   Wound Care Office

## 2014-12-22 NOTE — Progress Notes (Signed)
[facility]  [name], Tara Hopkins            ACCOUNT NO.:  [id number]  MEDICAL RECORD NO.:  [id number]      DATE OF BIRTH:  02/26/1951  PHYSICIAN:  Ricard Dillon, M.D. VISIT DATE:  12/21/2014                                  OFFICE VISIT   I have been asked to review the records of this 64 year old patient who is a patient that was discharged from this clinic in August 2015, after a difficult area on her right foot transmetatarsal amputation site was healed.  Specifically to comment on the medical necessity of hyperbaric oxygen that was given to this patient from May 18 through July 12, 2014.  With reference to the above, the patient is a type 2 diabetic who underwent a transmetatarsal amputation on December 15, 2013, due to osteomyelitis and abscess of the third toe.  She underwent a revision of the transmetatarsal amputation due to osteomyelitis of the right fifth metatarsal on February 23, 2014.  She presented to this clinic on Mar 14, 2014, with a failed flap at the right transmetatarsal amputation site with gross necrosis of soft tissue and most probably underlying osteomyelitis as noted by her orthopedic surgeon, Dr. Sharol Given in his February 23, 2014 operative note.  She received hyperbaric oxygen as part of ancillary treatment to heal this difficult wound area and was healed as of June 25, 2014 (all pictures included).  This patient clearly meets medical necessity for hyperbaric oxygen.  She had a failed flap with a grossly necrotic surgical area.  Underlying osteomyelitis was present in this area as it was previously described. Clearly, hyperbaric oxygen was indicated in this type 2 diabetic with the above medical and surgical issues.          ______________________________ Ricard Dillon, M.D.     MGR/MEDQ  D:  12/21/2014  T:  12/22/2014  Job:  [id number]

## 2014-12-26 ENCOUNTER — Ambulatory Visit (AMBULATORY_SURGERY_CENTER): Payer: Self-pay | Admitting: *Deleted

## 2014-12-26 VITALS — Ht 65.0 in | Wt 174.0 lb

## 2014-12-26 DIAGNOSIS — T24101A Burn of first degree of unspecified site of right lower limb, except ankle and foot, initial encounter: Secondary | ICD-10-CM | POA: Diagnosis not present

## 2014-12-26 DIAGNOSIS — Z1211 Encounter for screening for malignant neoplasm of colon: Secondary | ICD-10-CM

## 2014-12-26 MED ORDER — MOVIPREP 100 G PO SOLR: 1.0000 | Freq: Once | ORAL | Status: DC

## 2014-12-26 NOTE — Progress Notes (Signed)
No egg or soy allergy. No anesthesia problems.  No home O2.  No diet meds.  

## 2014-12-27 NOTE — H&P (Signed)
NAMEBRYONA, Tara Hopkins            ACCOUNT NO.:  [id number]  MEDICAL RECORD NO.:  [id number]  LOCATION:  FOOT                         FACILITY:  [facility]  PHYSICIAN:  Christin Fudge, MD       DATE OF BIRTH:  Sep 09, 1951  DATE OF ADMISSION:  12/19/2014 DATE OF DISCHARGE:                             HISTORY & PHYSICAL   SUBJECTIVE:  This very pleasant 64 year old patient was very well known to the [facility] had come last week with a chief complaint of having an area, which was blistered on her right lower extremity possibly from spurting cooking fluid either oil or some other hot liquid.  I had seen her in the clinic last week and she says she is doing fine and has no fresh complaints.  OBJECTIVE:  GENERAL:  She is awake and alert, lying comfortably in bed. VITAL SIGNS:  Stable and she is afebrile.  Examination of the patient reveals that her weight is 174 pounds, she is 5 feet 5 inches in height, blood pressure is 161/81, respirations 18, and pulse is 83. EXTREMITIES:  On examination of the posterior part of her right lower extremity, she has an area which is 1.0 x 0.7, which has slough and is down to the subcutaneous tissue and fascia.  She will require some sharp debridement of this area.  PROCEDURE:  After an appropriate time-out and application of lidocaine, I have used a curette to debride this subcutaneously to the level of all the slough I could remove.  This post debridement area is 1.0 x 0.7 x 0.1.  There is no active bleeding.  PLAN AND RECOMMENDATIONS:  I am going to recommend that we apply Santyl to this area.  The Santyl will be a small dab with a moist dressing over this and a light wrap.  CLINICAL IMPRESSION: 1. An 1 cm superficial grade 2 skin burn, possibly due to hot oil or     liquid. 2. Diabetes mellitus. 3. Hypertension. 4. Status post right foot transmetatarsal amputation. I have answered all questions.  She will get a dressing done and due to some  nonmedical issues and social issues, she will come back and see [place] in 2 weeks' time.          ______________________________ Christin Fudge, MD    EB/MEDQ  D:  12/26/2014  T:  12/27/2014  Job:  [id number]  cc:   Wound Care Office

## 2014-12-28 ENCOUNTER — Other Ambulatory Visit: Payer: Self-pay | Admitting: Family

## 2014-12-31 ENCOUNTER — Other Ambulatory Visit: Payer: Self-pay | Admitting: Family

## 2014-12-31 ENCOUNTER — Telehealth: Payer: Self-pay | Admitting: Internal Medicine

## 2014-12-31 NOTE — Telephone Encounter (Signed)
Returned pt's call and answered her questions about what we would be monitoring tomorrow such as BP,HR.Oxygen Saturation,Blood Sugar,etc.

## 2015-01-01 ENCOUNTER — Ambulatory Visit (AMBULATORY_SURGERY_CENTER): Payer: BC Managed Care – PPO | Admitting: Internal Medicine

## 2015-01-01 ENCOUNTER — Encounter: Payer: Self-pay | Admitting: Internal Medicine

## 2015-01-01 VITALS — BP 155/83 | HR 89 | Temp 96.0°F | Resp 19 | Ht 65.0 in | Wt 174.0 lb

## 2015-01-01 DIAGNOSIS — D125 Benign neoplasm of sigmoid colon: Secondary | ICD-10-CM

## 2015-01-01 DIAGNOSIS — K621 Rectal polyp: Secondary | ICD-10-CM

## 2015-01-01 DIAGNOSIS — Z1211 Encounter for screening for malignant neoplasm of colon: Secondary | ICD-10-CM

## 2015-01-01 DIAGNOSIS — D128 Benign neoplasm of rectum: Secondary | ICD-10-CM

## 2015-01-01 DIAGNOSIS — D129 Benign neoplasm of anus and anal canal: Secondary | ICD-10-CM

## 2015-01-01 LAB — GLUCOSE, CAPILLARY
GLUCOSE-CAPILLARY: 42 mg/dL — AB (ref 70–99)
Glucose-Capillary: 164 mg/dL — ABNORMAL HIGH (ref 70–99)
Glucose-Capillary: 48 mg/dL — ABNORMAL LOW (ref 70–99)
Glucose-Capillary: 179 mg/dL — ABNORMAL HIGH (ref 70–99)

## 2015-01-01 MED ORDER — SODIUM CHLORIDE 0.9 % IV SOLN: 500.0000 mL | INTRAVENOUS | Status: DC

## 2015-01-01 MED ORDER — DEXTROSE 50 % IV SOLN
25.0000 mL | Freq: Once | INTRAVENOUS | Status: AC
  Administered 2015-01-01: 25 mL via INTRAVENOUS

## 2015-01-01 NOTE — Progress Notes (Signed)
Called to room to assist during endoscopic procedure.  Patient ID and intended procedure confirmed with present staff. Received instructions for my participation in the procedure from the performing physician.  

## 2015-01-01 NOTE — Op Note (Signed)
[facility]  [employer]. [address], [address]   COLONOSCOPY PROCEDURE REPORT  [name], Tara Hopkins  MR#: [id number] BIRTHDATE: Jul 11, 1951 , 63  yrs. old GENDER: female ENDOSCOPIST: Eustace Quail, MD REFERRED EO:[id number] Megan Salon, FNP-BC PROCEDURE DATE:  01/01/2015 PROCEDURE:   Colonoscopy with snare polypectomy x 2 First Screening Colonoscopy - Avg.  risk and is 50 yrs.  old or older Yes.  Prior Negative Screening - Now for repeat screening. N/A  History of Adenoma - Now for follow-up colonoscopy & has been > or = to 3 yrs.  N/A  Polyps Removed Today? Yes. ASA CLASS:   Class II INDICATIONS:average risk patient for colorectal cancer. MEDICATIONS: Monitored anesthesia care and Propofol 250 mg IV  DESCRIPTION OF PROCEDURE:   After the risks benefits and alternatives of the procedure were thoroughly explained, informed consent was obtained.  The digital rectal exam revealed no abnormalities of the rectum.   The LB [id number]  endoscope was introduced through the anus and advanced to the cecum, which was identified by both the appendix and ileocecal valve. No adverse events experienced.   The quality of the prep was excellent, using MoviPrep  The instrument was then slowly withdrawn as the colon was fully examined.  COLON FINDINGS: Two polyps ranging between 3-64mm in size were found in the rectum and sigmoid colon.  A polypectomy was performed with a cold snare.  The resection was complete, the polyp tissue was completely retrieved and sent to histology.   There was mild diverticulosis noted in the sigmoid colon.   The examination was otherwise normal.  Retroflexed views revealed no abnormalities. The time to cecum=2 minutes 13 seconds.  Withdrawal time=11 minutes 40 seconds.  The scope was withdrawn and the procedure completed. COMPLICATIONS: There were no immediate complications.  ENDOSCOPIC IMPRESSION: 1.   Two polyps were found in the rectum and  sigmoid colon; polypectomy was performed with a cold snare 2.   Mild diverticulosis was noted in the sigmoid colon 3.   The examination was otherwise normal  RECOMMENDATIONS: 1. Follow up colonoscopy in 5 years  eSigned:  Eustace Quail, MD 01/01/2015 10:46 AM   cc: Carole Binning FNP and The Patient

## 2015-01-01 NOTE — Progress Notes (Signed)
Rechecked blood glucose at 1010 - 164. Stopped D5w, total 275 ml given. Restarted NS

## 2015-01-01 NOTE — Progress Notes (Signed)
Patient awakening,vss,report to rn 

## 2015-01-01 NOTE — Progress Notes (Signed)
Patient's blood sugar 48.On arrival patient complained of hunger and anxiety. Skin warm dry and pink. Patient's IV started with D5Wpiggy backed. D50 65ml given at 0955.

## 2015-01-01 NOTE — Patient Instructions (Signed)
Discharge instructions given. Handouts on polyps and diverticulosis. Resume previous medications. YOU HAD AN ENDOSCOPIC PROCEDURE TODAY AT THE [facility]: Refer to the procedure report that was given to you for any specific questions about what was found during the examination.  If the procedure report does not answer your questions, please call your gastroenterologist to clarify.  If you requested that your care partner not be given the details of your procedure findings, then the procedure report has been included in a sealed envelope for you to review at your convenience later.  YOU SHOULD EXPECT: Some feelings of bloating in the abdomen. Passage of more gas than usual.  Walking can help get rid of the air that was put into your GI tract during the procedure and reduce the bloating. If you had a lower endoscopy (such as a colonoscopy or flexible sigmoidoscopy) you may notice spotting of blood in your stool or on the toilet paper. If you underwent a bowel prep for your procedure, then you may not have a normal bowel movement for a few days.  DIET: Your first meal following the procedure should be a light meal and then it is ok to progress to your normal diet.  A half-sandwich or bowl of soup is an example of a good first meal.  Heavy or fried foods are harder to digest and may make you feel nauseous or bloated.  Likewise meals heavy in dairy and vegetables can cause extra gas to form and this can also increase the bloating.  Drink plenty of fluids but you should avoid alcoholic beverages for 24 hours.  ACTIVITY: Your care partner should take you home directly after the procedure.  You should plan to take it easy, moving slowly for the rest of the day.  You can resume normal activity the day after the procedure however you should NOT DRIVE or use heavy machinery for 24 hours (because of the sedation medicines used during the test).    SYMPTOMS TO REPORT IMMEDIATELY: A gastroenterologist  can be reached at any hour.  During normal business hours, 8:30 AM to 5:00 PM Monday through Friday, call [phone].  After hours and on weekends, please call the GI answering service at [phone] who will take a message and have the physician on call contact you.   Following lower endoscopy (colonoscopy or flexible sigmoidoscopy):  Excessive amounts of blood in the stool  Significant tenderness or worsening of abdominal pains  Swelling of the abdomen that is new, acute  Fever of 100F or higher  FOLLOW UP: If any biopsies were taken you will be contacted by phone or by letter within the next 1-3 weeks.  Call your gastroenterologist if you have not heard about the biopsies in 3 weeks.  Our staff will call the home number listed on your records the next business day following your procedure to check on you and address any questions or concerns that you may have at that time regarding the information given to you following your procedure. This is a courtesy call and so if there is no answer at the home number and we have not heard from you through the emergency physician on call, we will assume that you have returned to your regular daily activities without incident.  SIGNATURES/CONFIDENTIALITY: You and/or your care partner have signed paperwork which will be entered into your electronic medical record.  These signatures attest to the fact that that the information above on your After Visit Summary has been reviewed   and is understood.  Full responsibility of the confidentiality of this discharge information lies with you and/or your care-partner. 

## 2015-01-02 ENCOUNTER — Telehealth: Payer: Self-pay

## 2015-01-02 NOTE — Telephone Encounter (Signed)
  Follow up Call-  Call back number 01/01/2015  Post procedure Call Back phone  # [phone]  Permission to leave phone message Yes     Patient questions:  Do you have a fever, pain , or abdominal swelling? No. Pain Score  0 *  Have you tolerated food without any problems? Yes.    Have you been able to return to your normal activities? Yes.    Do you have any questions about your discharge instructions: Diet   No. Medications  No. Follow up visit  No.  Do you have questions or concerns about your Care? No.  Actions: * If pain score is 4 or above: No action needed, pain <4.

## 2015-01-08 ENCOUNTER — Encounter: Payer: Self-pay | Admitting: Internal Medicine

## 2015-01-09 ENCOUNTER — Encounter (HOSPITAL_BASED_OUTPATIENT_CLINIC_OR_DEPARTMENT_OTHER): Payer: BC Managed Care – PPO | Attending: Surgery

## 2015-01-09 DIAGNOSIS — T24031A Burn of unspecified degree of right lower leg, initial encounter: Secondary | ICD-10-CM | POA: Insufficient documentation

## 2015-01-09 DIAGNOSIS — E11622 Type 2 diabetes mellitus with other skin ulcer: Secondary | ICD-10-CM | POA: Diagnosis not present

## 2015-01-09 DIAGNOSIS — Z9181 History of falling: Secondary | ICD-10-CM | POA: Diagnosis not present

## 2015-01-09 DIAGNOSIS — E663 Overweight: Secondary | ICD-10-CM | POA: Insufficient documentation

## 2015-01-09 DIAGNOSIS — R269 Unspecified abnormalities of gait and mobility: Secondary | ICD-10-CM | POA: Insufficient documentation

## 2015-01-16 DIAGNOSIS — T24031A Burn of unspecified degree of right lower leg, initial encounter: Secondary | ICD-10-CM | POA: Diagnosis not present

## 2015-01-21 ENCOUNTER — Telehealth: Payer: Self-pay | Admitting: Family

## 2015-01-21 DIAGNOSIS — R296 Repeated falls: Secondary | ICD-10-CM

## 2015-01-21 NOTE — Telephone Encounter (Signed)
Pt called and ask Tamesha if you would give her a call back

## 2015-01-21 NOTE — Telephone Encounter (Signed)
Left message to advise pt of returned call

## 2015-01-21 NOTE — Telephone Encounter (Signed)
Pt requesting referral for physical therapy due to falls

## 2015-01-23 DIAGNOSIS — T24031A Burn of unspecified degree of right lower leg, initial encounter: Secondary | ICD-10-CM | POA: Diagnosis not present

## 2015-01-28 ENCOUNTER — Ambulatory Visit: Payer: Self-pay | Admitting: Psychology

## 2015-02-06 DIAGNOSIS — T24031A Burn of unspecified degree of right lower leg, initial encounter: Secondary | ICD-10-CM | POA: Diagnosis not present

## 2015-02-08 DIAGNOSIS — I639 Cerebral infarction, unspecified: Secondary | ICD-10-CM

## 2015-02-08 HISTORY — DX: Cerebral infarction, unspecified: I63.9

## 2015-02-13 ENCOUNTER — Encounter (HOSPITAL_BASED_OUTPATIENT_CLINIC_OR_DEPARTMENT_OTHER): Payer: BC Managed Care – PPO | Attending: Surgery

## 2015-02-13 DIAGNOSIS — S80821S Blister (nonthermal), right lower leg, sequela: Secondary | ICD-10-CM | POA: Insufficient documentation

## 2015-02-13 DIAGNOSIS — X088XXS Exposure to other specified smoke, fire and flames, sequela: Secondary | ICD-10-CM | POA: Insufficient documentation

## 2015-02-13 DIAGNOSIS — E11622 Type 2 diabetes mellitus with other skin ulcer: Secondary | ICD-10-CM | POA: Insufficient documentation

## 2015-02-13 DIAGNOSIS — T24331S Burn of third degree of right lower leg, sequela: Secondary | ICD-10-CM | POA: Diagnosis not present

## 2015-02-15 ENCOUNTER — Emergency Department (HOSPITAL_COMMUNITY): Payer: BC Managed Care – PPO

## 2015-02-15 ENCOUNTER — Inpatient Hospital Stay (HOSPITAL_COMMUNITY)
Admission: EM | Admit: 2015-02-15 | Discharge: 2015-02-20 | DRG: 062 | Disposition: A | Discharge status: Rehabilitation Facility | Payer: BC Managed Care – PPO | Attending: Neurology | Admitting: Neurology

## 2015-02-15 ENCOUNTER — Encounter (HOSPITAL_COMMUNITY): Payer: Self-pay | Admitting: Emergency Medicine

## 2015-02-15 DIAGNOSIS — I6529 Occlusion and stenosis of unspecified carotid artery: Secondary | ICD-10-CM | POA: Diagnosis present

## 2015-02-15 DIAGNOSIS — I6359 Cerebral infarction due to unspecified occlusion or stenosis of other cerebral artery: Secondary | ICD-10-CM | POA: Diagnosis not present

## 2015-02-15 DIAGNOSIS — E785 Hyperlipidemia, unspecified: Secondary | ICD-10-CM | POA: Diagnosis present

## 2015-02-15 DIAGNOSIS — F418 Other specified anxiety disorders: Secondary | ICD-10-CM | POA: Diagnosis present

## 2015-02-15 DIAGNOSIS — I129 Hypertensive chronic kidney disease with stage 1 through stage 4 chronic kidney disease, or unspecified chronic kidney disease: Secondary | ICD-10-CM | POA: Diagnosis present

## 2015-02-15 DIAGNOSIS — Z794 Long term (current) use of insulin: Secondary | ICD-10-CM

## 2015-02-15 DIAGNOSIS — I6330 Cerebral infarction due to thrombosis of unspecified cerebral artery: Secondary | ICD-10-CM | POA: Diagnosis not present

## 2015-02-15 DIAGNOSIS — I672 Cerebral atherosclerosis: Secondary | ICD-10-CM | POA: Diagnosis present

## 2015-02-15 DIAGNOSIS — I63312 Cerebral infarction due to thrombosis of left middle cerebral artery: Secondary | ICD-10-CM | POA: Diagnosis not present

## 2015-02-15 DIAGNOSIS — M216X1 Other acquired deformities of right foot: Secondary | ICD-10-CM | POA: Diagnosis present

## 2015-02-15 DIAGNOSIS — N179 Acute kidney failure, unspecified: Secondary | ICD-10-CM | POA: Diagnosis not present

## 2015-02-15 DIAGNOSIS — G8194 Hemiplegia, unspecified affecting left nondominant side: Secondary | ICD-10-CM | POA: Diagnosis present

## 2015-02-15 DIAGNOSIS — E119 Type 2 diabetes mellitus without complications: Secondary | ICD-10-CM | POA: Diagnosis present

## 2015-02-15 DIAGNOSIS — G629 Polyneuropathy, unspecified: Secondary | ICD-10-CM | POA: Diagnosis present

## 2015-02-15 DIAGNOSIS — I69354 Hemiplegia and hemiparesis following cerebral infarction affecting left non-dominant side: Secondary | ICD-10-CM | POA: Diagnosis not present

## 2015-02-15 DIAGNOSIS — I6789 Other cerebrovascular disease: Secondary | ICD-10-CM | POA: Diagnosis not present

## 2015-02-15 DIAGNOSIS — N183 Chronic kidney disease, stage 3 (moderate): Secondary | ICD-10-CM | POA: Diagnosis present

## 2015-02-15 DIAGNOSIS — Z7982 Long term (current) use of aspirin: Secondary | ICD-10-CM

## 2015-02-15 DIAGNOSIS — F17210 Nicotine dependence, cigarettes, uncomplicated: Secondary | ICD-10-CM | POA: Diagnosis present

## 2015-02-15 DIAGNOSIS — I639 Cerebral infarction, unspecified: Secondary | ICD-10-CM | POA: Diagnosis present

## 2015-02-15 DIAGNOSIS — E1159 Type 2 diabetes mellitus with other circulatory complications: Secondary | ICD-10-CM | POA: Diagnosis not present

## 2015-02-15 DIAGNOSIS — E1142 Type 2 diabetes mellitus with diabetic polyneuropathy: Secondary | ICD-10-CM | POA: Diagnosis not present

## 2015-02-15 DIAGNOSIS — R531 Weakness: Secondary | ICD-10-CM | POA: Diagnosis not present

## 2015-02-15 DIAGNOSIS — E1165 Type 2 diabetes mellitus with hyperglycemia: Secondary | ICD-10-CM | POA: Diagnosis present

## 2015-02-15 DIAGNOSIS — H409 Unspecified glaucoma: Secondary | ICD-10-CM | POA: Diagnosis present

## 2015-02-15 DIAGNOSIS — G8190 Hemiplegia, unspecified affecting unspecified side: Secondary | ICD-10-CM | POA: Diagnosis not present

## 2015-02-15 DIAGNOSIS — Z79899 Other long term (current) drug therapy: Secondary | ICD-10-CM

## 2015-02-15 DIAGNOSIS — I10 Essential (primary) hypertension: Secondary | ICD-10-CM | POA: Diagnosis not present

## 2015-02-15 DIAGNOSIS — F1410 Cocaine abuse, uncomplicated: Secondary | ICD-10-CM | POA: Insufficient documentation

## 2015-02-15 DIAGNOSIS — R197 Diarrhea, unspecified: Secondary | ICD-10-CM | POA: Diagnosis not present

## 2015-02-15 LAB — COMPREHENSIVE METABOLIC PANEL
ALK PHOS: 93 U/L (ref 39–117)
ALT: 23 U/L (ref 0–35)
AST: 22 U/L (ref 0–37)
Albumin: 3.8 g/dL (ref 3.5–5.2)
Anion gap: 8 (ref 5–15)
BUN: 21 mg/dL (ref 6–23)
CO2: 23 mmol/L (ref 19–32)
Calcium: 9.4 mg/dL (ref 8.4–10.5)
Chloride: 106 mmol/L (ref 96–112)
Creatinine, Ser: 1.42 mg/dL — ABNORMAL HIGH (ref 0.50–1.10)
GFR calc non Af Amer: 39 mL/min — ABNORMAL LOW (ref >90)
GFR, EST AFRICAN AMERICAN: 45 mL/min — AB (ref >90)
GLUCOSE: 247 mg/dL — AB (ref 70–99)
POTASSIUM: 4.4 mmol/L (ref 3.5–5.1)
Sodium: 137 mmol/L (ref 135–145)
Total Bilirubin: 0.4 mg/dL (ref 0.3–1.2)
Total Protein: 6.9 g/dL (ref 6.0–8.3)

## 2015-02-15 LAB — DIFFERENTIAL
Basophils Absolute: 0.1 10*3/uL (ref 0.0–0.1)
Basophils Relative: 1 % (ref 0–1)
Eosinophils Absolute: 0.2 10*3/uL (ref 0.0–0.7)
Eosinophils Relative: 2 % (ref 0–5)
LYMPHS ABS: 3.8 10*3/uL (ref 0.7–4.0)
LYMPHS PCT: 39 % (ref 12–46)
MONOS PCT: 4 % (ref 3–12)
Monocytes Absolute: 0.4 10*3/uL (ref 0.1–1.0)
NEUTROS ABS: 5.3 10*3/uL (ref 1.7–7.7)
NEUTROS PCT: 54 % (ref 43–77)

## 2015-02-15 LAB — I-STAT TROPONIN, ED: Troponin i, poc: 0.01 ng/mL (ref 0.00–0.08)

## 2015-02-15 LAB — ETHANOL: Alcohol, Ethyl (B): <5 mg/dL (ref 0–9)

## 2015-02-15 LAB — CBC
HCT: 38.7 % (ref 36.0–46.0)
HEMOGLOBIN: 12.5 g/dL (ref 12.0–15.0)
MCH: 28.9 pg (ref 26.0–34.0)
MCHC: 32.3 g/dL (ref 30.0–36.0)
MCV: 89.4 fL (ref 78.0–100.0)
PLATELETS: 312 10*3/uL (ref 150–400)
RBC: 4.33 MIL/uL (ref 3.87–5.11)
RDW: 13.4 % (ref 11.5–15.5)
WBC: 9.8 10*3/uL (ref 4.0–10.5)

## 2015-02-15 LAB — CBG MONITORING, ED: GLUCOSE-CAPILLARY: 239 mg/dL — AB (ref 70–99)

## 2015-02-15 LAB — I-STAT CHEM 8, ED
BUN: 24 mg/dL — ABNORMAL HIGH (ref 6–23)
CALCIUM ION: 1.26 mmol/L (ref 1.13–1.30)
CHLORIDE: 103 mmol/L (ref 96–112)
Creatinine, Ser: 1.4 mg/dL — ABNORMAL HIGH (ref 0.50–1.10)
GLUCOSE: 252 mg/dL — AB (ref 70–99)
HEMATOCRIT: 41 % (ref 36.0–46.0)
HEMOGLOBIN: 13.9 g/dL (ref 12.0–15.0)
Potassium: 4.5 mmol/L (ref 3.5–5.1)
Sodium: 140 mmol/L (ref 135–145)
TCO2: 21 mmol/L (ref 0–100)

## 2015-02-15 MED ORDER — STROKE: EARLY STAGES OF RECOVERY BOOK
Freq: Once | Status: DC
  Filled 2015-02-15: qty 1

## 2015-02-15 MED ORDER — PANTOPRAZOLE SODIUM 40 MG IV SOLR
40.0000 mg | Freq: Every day | INTRAVENOUS | Status: DC
  Administered 2015-02-16: 40 mg via INTRAVENOUS
  Filled 2015-02-15 (×2): qty 40

## 2015-02-15 MED ORDER — LABETALOL HCL 5 MG/ML IV SOLN
INTRAVENOUS | Status: AC
  Filled 2015-02-15: qty 4

## 2015-02-15 MED ORDER — SENNOSIDES-DOCUSATE SODIUM 8.6-50 MG PO TABS
1.0000 | ORAL_TABLET | Freq: Every evening | ORAL | Status: DC | PRN
  Filled 2015-02-15: qty 1

## 2015-02-15 MED ORDER — LABETALOL HCL 5 MG/ML IV SOLN
10.0000 mg | INTRAVENOUS | Status: DC | PRN
  Administered 2015-02-16: 10 mg via INTRAVENOUS
  Filled 2015-02-15: qty 4

## 2015-02-15 MED ORDER — LABETALOL HCL 5 MG/ML IV SOLN
10.0000 mg | Freq: Once | INTRAVENOUS | Status: AC
  Administered 2015-02-15: 10 mg via INTRAVENOUS

## 2015-02-15 MED ORDER — SODIUM CHLORIDE 0.9 % IV SOLN
INTRAVENOUS | Status: DC
  Administered 2015-02-15: via INTRAVENOUS
  Administered 2015-02-16: 50 mL/h via INTRAVENOUS

## 2015-02-15 MED ORDER — ACETAMINOPHEN 325 MG PO TABS
650.0000 mg | ORAL_TABLET | ORAL | Status: DC | PRN
  Administered 2015-02-16 – 2015-02-20 (×8): 650 mg via ORAL
  Filled 2015-02-15 (×8): qty 2

## 2015-02-15 MED ORDER — ACETAMINOPHEN 650 MG RE SUPP: 650.0000 mg | RECTAL | Status: DC | PRN

## 2015-02-15 MED ORDER — ALTEPLASE (STROKE) FULL DOSE INFUSION
0.9000 mg/kg | Freq: Once | INTRAVENOUS | Status: AC
  Administered 2015-02-15: 76 mg via INTRAVENOUS
  Filled 2015-02-15: qty 76

## 2015-02-15 MED ORDER — SODIUM CHLORIDE 0.9 % IV SOLN
50.0000 mL | Freq: Once | INTRAVENOUS | Status: AC
  Administered 2015-02-16: 50 mL via INTRAVENOUS

## 2015-02-15 NOTE — ED Notes (Signed)
Verbal order from Dr. Janann Colonel that pt will not likely need IR, no foley to be placed at this time.

## 2015-02-15 NOTE — H&P (Signed)
Stroke Consult    Chief Complaint: left sided weakness HPI: Tara Hopkins is an 64 y.o. female with history of HTN, DM, HLD presenting with acute onset of left sided weakness. Notes symptoms started at 2130 when she developed left sided weakness LE > UE. Denies any sensory or visual changes. Notes some slurred speech. Upon EMS arrival noted BP of 220/130, CBG of 224. CT head imaging reviewed, no signs of acute stroke. Given labetalol 10mg  IV x 1 with improvement of BP to 167/71.   Date last known well: 02/15/2015 Time last known well: 2130 tPA Given: yes, initial NIHSS of 5  Past Medical History  Diagnosis Date  . Diabetes mellitus without complication   . Hypercholesteremia   . Peripheral neuropathy   . Hypertension     not on medications any longer  . Anxiety   . Depression   . History of blood transfusion   . History of hyperbaric oxygen therapy     pt reports 80 treatments.  . Anemia   . Glaucoma     Past Surgical History  Procedure Laterality Date  . I&d extremity Right 05/23/2013    Procedure: IRRIGATION AND DEBRIDEMENT RIGHT GREAT TOE;  Surgeon: Mauri Pole, MD;  Location: [facility];  Service: Orthopedics;  Laterality: Right;  . Amputation Right 12/15/2013    Procedure: Right Transmetatarsal Amputation;  Surgeon: Newt Minion, MD;  Location: [place];  Service: Orthopedics;  Laterality: Right;  . Amputation Right 02/23/2014    Procedure: AMPUTATION FOOT;  Surgeon: Newt Minion, MD;  Location: [place];  Service: Orthopedics;  Laterality: Right;  Right Foot Revision Transmetatarsal Amputation    Family History  Problem Relation Age of Onset  . Colon cancer Neg Hx   . Liver cancer Mother   . Lung cancer Mother    Social History:  reports that she has been smoking Cigarettes.  She has a .2 pack-year smoking history. She has never used smokeless tobacco. She reports that she drinks alcohol. She reports that she does not use illicit drugs.  Allergies: No Known  Allergies   (Not in a hospital admission)  ROS: Out of a complete 14 system review, the patient complains of only the following symptoms, and all other reviewed systems are negative. +left sided weakness, slurred speech  Physical Examination: Filed Vitals:   02/15/15 2325  BP: 194/79  Pulse: 78  Temp:   Resp: 14   Physical Exam  Constitutional: He appears well-developed and well-nourished.  Psych: Affect appropriate to situation Eyes: No scleral injection HENT: No OP obstrucion Head: Normocephalic.  Cardiovascular: Normal rate and regular rhythm.  Respiratory: Effort normal and breath sounds normal.  GI: Soft. Bowel sounds are normal. No distension. There is no tenderness.  Skin: WDI  Neurologic Examination: Mental Status: Alert, oriented, thought content appropriate.  Speech fluent without evidence of aphasia.  Mild slurred speech. Able to follow 3 step commands without difficulty. Cranial Nerves: II: optic discs not visualized, visual fields grossly normal, pupils equal, round, reactive to light and accommodation III,IV, VI: ptosis not present, extra-ocular motions intact bilaterally V,VII: mild flattening L NLF, facial light touch sensation normal bilaterally VIII: hearing normal bilaterally IX,X: gag reflex present XI: trapezius strength/neck flexion strength normal bilaterally XII: tongue strength normal  Motor: RUE and RLE 5/5 strength (amputation of distal RLE) Pronator drift of LUE. Proximal 4+/5, distal 4+/5 Drift of LLE. Proximal 4-/5, distal 4+/5 Tone and bulk:normal tone throughout; no atrophy noted Sensory: Pinprick and light touch  intact throughout, bilaterally Deep Tendon Reflexes: 1+ symmetric throughout, unable to test AJ on the right Plantars: Right: downgoing   Left: downgoing Cerebellar: normal finger-to-nose bilaterally, mild dysmetria with HTS on the left Gait: deferred  Laboratory Studies:   Basic Metabolic Panel:  Recent Labs Lab  02/15/15 2259  NA 140  K 4.5  CL 103  GLUCOSE 252*  BUN 24*  CREATININE 1.40*    Liver Function Tests: No results for input(s): AST, ALT, ALKPHOS, BILITOT, PROT, ALBUMIN in the last 168 hours. No results for input(s): LIPASE, AMYLASE in the last 168 hours. No results for input(s): AMMONIA in the last 168 hours.  CBC:  Recent Labs Lab 02/15/15 2256 02/15/15 2259  WBC 9.8  --   NEUTROABS 5.3  --   HGB 12.5 13.9  HCT 38.7 41.0  MCV 89.4  --   PLT 312  --     Cardiac Enzymes: No results for input(s): CKTOTAL, CKMB, CKMBINDEX, TROPONINI in the last 168 hours.  BNP: Invalid input(s): POCBNP  CBG:  Recent Labs Lab 02/15/15 2255  GLUCAP 239*    Microbiology: Results for orders placed or performed during the hospital encounter of 05/20/13  Culture, routine-abscess     Status: None   Collection Time: 05/21/13  1:36 AM  Result Value Ref Range Status   Specimen Description WOUND FOOT  Final   Special Requests NONE  Final   Gram Stain   Final    NO WBC SEEN NO SQUAMOUS EPITHELIAL CELLS SEEN NO ORGANISMS SEEN   Culture FEW MORGANELLA MORGANII  Final   Report Status 05/25/2013 FINAL  Final   Organism ID, Bacteria MORGANELLA MORGANII  Final      Susceptibility   Morganella morganii - MIC*    AMPICILLIN >=32 RESISTANT Resistant     AMPICILLIN/SULBACTAM RESISTANT      CEFAZOLIN >=64 RESISTANT Resistant     CEFEPIME <=1 SENSITIVE Sensitive     CEFTAZIDIME <=1 SENSITIVE Sensitive     CEFTRIAXONE <=1 SENSITIVE Sensitive     CIPROFLOXACIN <=0.25 SENSITIVE Sensitive     GENTAMICIN <=1 SENSITIVE Sensitive     IMIPENEM 2 SENSITIVE Sensitive     PIP/TAZO <=4 SENSITIVE Sensitive     TOBRAMYCIN <=1 SENSITIVE Sensitive     TRIMETH/SULFA <=20 SENSITIVE Sensitive     CEFOXITIN 16 INTERMEDIATE Intermediate     * FEW MORGANELLA MORGANII  Surgical pcr screen     Status: None   Collection Time: 05/23/13  7:00 AM  Result Value Ref Range Status   MRSA, PCR NEGATIVE NEGATIVE  Final   Staphylococcus aureus NEGATIVE NEGATIVE Final    Comment:        The Xpert SA Assay (FDA approved for NASAL specimens in patients over 73 years of age), is one component of a comprehensive surveillance program.  Test performance has been validated by [employer] for patients greater than or equal to 70 year old. It is not intended to diagnose infection nor to guide or monitor treatment.  Culture, routine-abscess     Status: None   Collection Time: 05/23/13  3:07 PM  Result Value Ref Range Status   Specimen Description FOOT RIGHT DIABETIC FOOT ULCER RIGHT GREAT TOE  Final   Special Requests ZOSYN,VANCOMYCIN  Final   Gram Stain   Final    FEW WBC PRESENT, PREDOMINANTLY PMN NO SQUAMOUS EPITHELIAL CELLS SEEN RARE GRAM POSITIVE COCCI IN PAIRS   Culture   Final    FEW GROUP B STREP(S.AGALACTIAE)ISOLATED Note: TESTING  AGAINST S. AGALACTIAE NOT ROUTINELY PERFORMED DUE TO PREDICTABILITY OF AMP/PEN/VAN SUSCEPTIBILITY.   Report Status 05/26/2013 FINAL  Final  Anaerobic culture     Status: None   Collection Time: 05/23/13  3:07 PM  Result Value Ref Range Status   Specimen Description FOOT RIGHT DIABETIC FOOT ULCER RIGHT GREAT TOE  Final   Special Requests ZOSYN,VANCOMYCIN  Final   Gram Stain   Final    FEW WBC PRESENT, PREDOMINANTLY PMN NO SQUAMOUS EPITHELIAL CELLS SEEN RARE GRAM POSITIVE COCCI IN PAIRS   Culture NO ANAEROBES ISOLATED  Final   Report Status 05/28/2013 FINAL  Final    Coagulation Studies: No results for input(s): LABPROT, INR in the last 72 hours.  Urinalysis: No results for input(s): COLORURINE, LABSPEC, PHURINE, GLUCOSEU, HGBUR, BILIRUBINUR, KETONESUR, PROTEINUR, UROBILINOGEN, NITRITE, LEUKOCYTESUR in the last 168 hours.  Invalid input(s): APPERANCEUR  Lipid Panel:     Component Value Date/Time   CHOL 164 08/20/2014 1053   TRIG 149.0 08/20/2014 1053   HDL 46.10 08/20/2014 1053   CHOLHDL 4 08/20/2014 1053   VLDL 29.8 08/20/2014 1053   LDLCALC 88  08/20/2014 1053    HgbA1C:  Lab Results  Component Value Date   HGBA1C 8.3* 08/20/2014    Urine Drug Screen:  No results found for: LABOPIA, COCAINSCRNUR, LABBENZ, AMPHETMU, THCU, LABBARB  Alcohol Level: No results for input(s): ETH in the last 168 hours.  Other results: EKG: normal EKG, normal sinus rhythm.  Imaging: Ct Head Wo Contrast  02/15/2015   CLINICAL DATA:  Code stroke. Left-sided weakness and left facial droop.  EXAM: CT HEAD WITHOUT CONTRAST  TECHNIQUE: Contiguous axial images were obtained from the base of the skull through the vertex without intravenous contrast.  COMPARISON:  None.  FINDINGS: The basal ganglia are intact. No acute cortical infarct is present. Mild periventricular white matter changes are noted bilaterally. These are somewhat advanced for age. The ventricles are of normal size. No significant extraaxial fluid collection is present.  The paranasal sinuses and mastoid air cells are clear. The globes and orbits are intact. The calvarium is unremarkable. No significant extracranial soft tissue injury is evident.  ASPECTS score = 10/10  [place] Stroke Program Early CT Score  Normal score = 10  IMPRESSION: 1. Mild periventricular white matter changes bilaterally. 2. No acute intracranial abnormality. No evidence for acute infarct.   Electronically Signed   By: San Morelle M.D.   On: 02/15/2015 23:10    Assessment: 64 y.o. female with history of HTN, HLD, DM presenting with acute onset of left sided weakness LE >UE. History and presentation consistent with ischemic infarct. Head CT showed no acute process. Initial NIHSS of 5. tPA given and patient admitted to neuro-ICU for further stroke workup.    Plan: 1. HgbA1c, fasting lipid panel 2. MRI, MRA  of the brain without contrast 3. PT consult, OT consult, Speech consult 4. Echocardiogram 5. Carotid dopplers 6. Prophylactic therapy-hold antiplatelet for 24 hrs post tPA 7. Risk factor modification 8.  Telemetry monitoring 9. Frequent neuro checks 10. NPO until RN stroke swallow screen   This patient is critically ill and at significant risk of neurological worsening, death and care requires constant monitoring of vital signs, hemodynamics,respiratory and cardiac monitoring,review of multiple databases, neurological assessment, discussion with family, other specialists and medical decision making of high complexity. I spent 45 inutes of neurocritical care time in the care of this patient.   Jim Like, DO [facility] [phone]  If 7pm- 7am, please page  neurology on call as listed in AMION. 02/15/2015, 11:33 PM

## 2015-02-15 NOTE — Code Documentation (Signed)
Tara Hopkins presented to the [facility] after developing Lt side weakness & slurred speech while with family.  Upon EMS arrival she was found to have a BP 224/135 & BS 224.  She remained hypertensive and was treated with labetalol in the ED.  On assessment she was found to have dysarthria & left side weakness.  NIH 6. Decision was made to treat with tPA. Plan admit to 11M on stroke service.

## 2015-02-15 NOTE — ED Notes (Signed)
Pt arrives via EMS as a code stroke, KNW 2130, sudden onset weakeness in L leg while talking to her niece. Hx HTN, DM. Heaviness to L leg, L arm weakness and drift. Slurred speech, L sided facial droop. A/Ox4.

## 2015-02-15 NOTE — Progress Notes (Addendum)
RRRN called pharmacy 2306 (per stroke clock) for tPA. Entered order 2311 (per Epic time). tPA delivered 2316 (per stroke clock).   Wynona Neat, PharmD, BCPS 02/15/2015

## 2015-02-15 NOTE — ED Provider Notes (Signed)
CSN: [id number]     Arrival date & time 02/15/15  2253 History   First MD Initiated Contact with Patient 02/15/15 2301     Chief Complaint  Patient presents with  . Code Stroke     (Consider location/radiation/quality/duration/timing/severity/associated sxs/prior Treatment) The history is provided by the patient.   64 year old female noted onset of difficulty walking at about 10 PM. Last known normal time where she was able to walk normally was 10 PM. She also noted some heaviness of her left arm and some difficulty using her left leg. There is some difficulty breathing but no chest pain. She denies headache or dizziness. Symptoms have been unchanging. Nothing makes it better nothing makes it worse. She came to the ED as a code stroke. Speech was slurred and is improved somewhat but still slightly slurred.  Past Medical History  Diagnosis Date  . Diabetes mellitus without complication   . Hypercholesteremia   . Peripheral neuropathy   . Hypertension     not on medications any longer  . Anxiety   . Depression   . History of blood transfusion   . History of hyperbaric oxygen therapy     pt reports 80 treatments.  . Anemia   . Glaucoma    Past Surgical History  Procedure Laterality Date  . I&d extremity Right 05/23/2013    Procedure: IRRIGATION AND DEBRIDEMENT RIGHT GREAT TOE;  Surgeon: Mauri Pole, MD;  Location: [facility];  Service: Orthopedics;  Laterality: Right;  . Amputation Right 12/15/2013    Procedure: Right Transmetatarsal Amputation;  Surgeon: Newt Minion, MD;  Location: [place];  Service: Orthopedics;  Laterality: Right;  . Amputation Right 02/23/2014    Procedure: AMPUTATION FOOT;  Surgeon: Newt Minion, MD;  Location: [place];  Service: Orthopedics;  Laterality: Right;  Right Foot Revision Transmetatarsal Amputation   Family History  Problem Relation Age of Onset  . Colon cancer Neg Hx   . Liver cancer Mother   . Lung cancer Mother    History  Substance Use Topics  .  Smoking status: Current Every Day Smoker -- 0.01 packs/day for 20 years    Types: Cigarettes  . Smokeless tobacco: Never Used  . Alcohol Use: 0.0 oz/week    0 Standard drinks or equivalent per week     Comment: beer every 3 months   OB History    No data available     Review of Systems  All other systems reviewed and are negative.     Allergies  Review of patient's allergies indicates no known allergies.  Home Medications   Prior to Admission medications   Medication Sig Start Date End Date Taking? Authorizing Provider  acetaminophen (TYLENOL) 500 MG tablet Take 1,000 mg by mouth every 6 (six) hours as needed for headache.    Historical Provider, MD  clonazePAM (KLONOPIN) 0.5 MG tablet take 1 tablet by mouth three times a day if needed 01/01/15   Timoteo Gaul, FNP  COD LIVER OIL PO Take 1 capsule by mouth daily.    Historical Provider, MD  gabapentin (NEURONTIN) 400 MG capsule Take 2 capsules (800 mg total) by mouth 2 (two) times daily. 10/22/14   Timoteo Gaul, FNP  glimepiride (AMARYL) 4 MG tablet Take 1 tablet (4 mg total) by mouth daily before breakfast. 09/28/14   Timoteo Gaul, FNP  glucose blood test strip Use as instructed 06/01/14   Timoteo Gaul, FNP  insulin aspart (NOVOLOG FLEXPEN) 100 UNIT/ML  FlexPen Inject 2-6 units into skin 3 times daily 09/28/14   Timoteo Gaul, FNP  insulin glargine (LANTUS) 100 UNIT/ML injection Inject 0.3 mLs (30 Units total) into the skin at bedtime. Patient taking differently: Inject 33 Units into the skin at bedtime.  12/04/14   Timoteo Gaul, FNP  lamoTRIgine (LAMICTAL) 100 MG tablet Take 0.5 tablets (50 mg total) by mouth 2 (two) times daily. 10/22/14   Timoteo Gaul, FNP  latanoprost (XALATAN) 0.005 % ophthalmic solution place 1 drop into both eyes at bedtime 08/06/14   Timoteo Gaul, FNP  lisinopril (PRINIVIL,ZESTRIL) 10 MG tablet take 1 tablet by mouth once daily 12/28/14   Timoteo Gaul, FNP   ONE TOUCH LANCETS MISC USE TO CHECK BLOOD SUGAR 3 TIMES DAILY 06/01/14   Timoteo Gaul, FNP  rosuvastatin (CRESTOR) 10 MG tablet Take 1 tablet (10 mg total) by mouth daily. 09/28/14   Timoteo Gaul, FNP  saxagliptin HCl (ONGLYZA) 5 MG TABS tablet Take 1 tablet (5 mg total) by mouth daily. 09/28/14   Timoteo Gaul, FNP  simvastatin (ZOCOR) 20 MG tablet Take 1 tablet (20 mg total) by mouth at bedtime. 09/28/14   Timoteo Gaul, FNP  white petrolatum (VASELINE) GEL Apply 1 application topically at bedtime. To both feet    Historical Provider, MD  zinc gluconate 50 MG tablet Take 50 mg by mouth 2 (two) times daily.    Historical Provider, MD   BP 194/90 mmHg  Pulse 74  Temp(Src) 97.6 F (36.4 C) (Oral)  Resp 16  Wt 185 lb 3 oz (84 kg)  SpO2 98% Physical Exam  Nursing note and vitals reviewed.  64 year old female, resting comfortably and in no acute distress. Vital signs are significant for hypertension. Oxygen saturation is 98%, which is normal. Head is normocephalic and atraumatic. PERRLA, EOMI. Oropharynx is clear. Neck is nontender and supple without adenopathy or JVD. There are no carotid bruits. Back is nontender and there is no CVA tenderness. Lungs are clear without rales, wheezes, or rhonchi. Chest is nontender. Heart has regular rate and rhythm without murmur. Abdomen is soft, flat, nontender without masses or hepatosplenomegaly and peristalsis is normoactive. Extremities have no cyanosis or edema, full range of motion is present. Status post right midfoot amputation. Skin is warm and dry without rash. Neurologic: She is awake, alert, oriented. Speech is mildly slurred, cranial nerves are intact. There is moderately severe left hemi-para cyst with strength 3/5 in left arm and left leg.  ED Course  Procedures (including critical care time) Labs Review Results for orders placed or performed during the hospital encounter of 02/15/15  CBC  Result Value Ref Range    WBC 9.8 4.0 - 10.5 K/uL   RBC 4.33 3.87 - 5.11 MIL/uL   Hemoglobin 12.5 12.0 - 15.0 g/dL   HCT 38.7 36.0 - 46.0 %   MCV 89.4 78.0 - 100.0 fL   MCH 28.9 26.0 - 34.0 pg   MCHC 32.3 30.0 - 36.0 g/dL   RDW 13.4 11.5 - 15.5 %   Platelets 312 150 - 400 K/uL  Differential  Result Value Ref Range   Neutrophils Relative % 54 43 - 77 %   Neutro Abs 5.3 1.7 - 7.7 K/uL   Lymphocytes Relative 39 12 - 46 %   Lymphs Abs 3.8 0.7 - 4.0 K/uL   Monocytes Relative 4 3 - 12 %   Monocytes Absolute 0.4 0.1 - 1.0 K/uL   Eosinophils  Relative 2 0 - 5 %   Eosinophils Absolute 0.2 0.0 - 0.7 K/uL   Basophils Relative 1 0 - 1 %   Basophils Absolute 0.1 0.0 - 0.1 K/uL  I-Stat Chem 8, ED  Result Value Ref Range   Sodium 140 135 - 145 mmol/L   Potassium 4.5 3.5 - 5.1 mmol/L   Chloride 103 96 - 112 mmol/L   BUN 24 (H) 6 - 23 mg/dL   Creatinine, Ser 1.40 (H) 0.50 - 1.10 mg/dL   Glucose, Bld 252 (H) 70 - 99 mg/dL   Calcium, Ion 1.26 1.13 - 1.30 mmol/L   TCO2 21 0 - 100 mmol/L   Hemoglobin 13.9 12.0 - 15.0 g/dL   HCT 41.0 36.0 - 46.0 %  I-Stat Troponin, ED (not at [facility])  Result Value Ref Range   Troponin i, poc 0.01 0.00 - 0.08 ng/mL   Comment 3          CBG monitoring, ED  Result Value Ref Range   Glucose-Capillary 239 (H) 70 - 99 mg/dL   Imaging Review Ct Head Wo Contrast  02/15/2015   CLINICAL DATA:  Code stroke. Left-sided weakness and left facial droop.  EXAM: CT HEAD WITHOUT CONTRAST  TECHNIQUE: Contiguous axial images were obtained from the base of the skull through the vertex without intravenous contrast.  COMPARISON:  None.  FINDINGS: The basal ganglia are intact. No acute cortical infarct is present. Mild periventricular white matter changes are noted bilaterally. These are somewhat advanced for age. The ventricles are of normal size. No significant extraaxial fluid collection is present.  The paranasal sinuses and mastoid air cells are clear. The globes and orbits are intact. The calvarium is  unremarkable. No significant extracranial soft tissue injury is evident.  ASPECTS score = 10/10  [place] Stroke Program Early CT Score  Normal score = 10  IMPRESSION: 1. Mild periventricular white matter changes bilaterally. 2. No acute intracranial abnormality. No evidence for acute infarct.   Electronically Signed   By: San Morelle M.D.   On: 02/15/2015 23:10   Images viewed by me.   EKG Interpretation   Date/Time:  Friday February 15 2015 23:12:43 EDT Ventricular Rate:  76 PR Interval:  194 QRS Duration: 91 QT Interval:  426 QTC Calculation: 479 R Axis:   73 Text Interpretation:  Sinus rhythm Left atrial enlargement Probable LVH  with secondary repol abnrm Lateral infarct, acute Baseline wander When  compared with ECG of 12/15/2013, No significant change was found Confirmed  by [facility]  MD, Arham Symmonds ([id number]) on 02/15/2015 11:19:32 PM      CRITICAL CARE Performed by: UEAVW,[email or web address] Total critical care time: 35 minutes Critical care time was exclusive of separately billable procedures and treating other patients. Critical care was necessary to treat or prevent imminent or life-threatening deterioration. Critical care was time spent personally by me on the following activities: development of treatment plan with patient and/or surrogate as well as nursing, discussions with consultants, evaluation of patient's response to treatment, examination of patient, obtaining history from patient or surrogate, ordering and performing treatments and interventions, ordering and review of laboratory studies, ordering and review of radiographic studies, pulse oximetry and re-evaluation of patient's condition.  MDM   Final diagnoses:  None    Right hemisphere stroke. Head CT shows no evidence of bleeding. She is a good candidate for thrombolytic therapy. Patient is seen in conjunction with Dr. Janann Colonel on [facility], who agrees to admit the patient for thrombolytic therapy. Hypertension is  treated with intravenous labetalol.     Delora Fuel, MD 34/37/35 [id number]

## 2015-02-16 ENCOUNTER — Inpatient Hospital Stay (HOSPITAL_COMMUNITY): Payer: BC Managed Care – PPO

## 2015-02-16 DIAGNOSIS — I6789 Other cerebrovascular disease: Secondary | ICD-10-CM

## 2015-02-16 LAB — GLUCOSE, CAPILLARY
GLUCOSE-CAPILLARY: 253 mg/dL — AB (ref 70–99)
GLUCOSE-CAPILLARY: 237 mg/dL — AB (ref 70–99)
Glucose-Capillary: 174 mg/dL — ABNORMAL HIGH (ref 70–99)
Glucose-Capillary: 232 mg/dL — ABNORMAL HIGH (ref 70–99)

## 2015-02-16 LAB — LIPID PANEL
Cholesterol: 195 mg/dL (ref 0–200)
HDL: 39 mg/dL — AB (ref >39)
LDL Cholesterol: 88 mg/dL (ref 0–99)
TRIGLYCERIDES: 341 mg/dL — AB (ref <150)
Total CHOL/HDL Ratio: 5 RATIO
VLDL: 68 mg/dL — ABNORMAL HIGH (ref 0–40)

## 2015-02-16 LAB — RAPID URINE DRUG SCREEN, HOSP PERFORMED
AMPHETAMINES: NOT DETECTED
BARBITURATES: NOT DETECTED
Benzodiazepines: NOT DETECTED
Cocaine: POSITIVE — AB
OPIATES: NOT DETECTED
Tetrahydrocannabinol: NOT DETECTED

## 2015-02-16 LAB — URINALYSIS, ROUTINE W REFLEX MICROSCOPIC
BILIRUBIN URINE: NEGATIVE
Glucose, UA: 100 mg/dL — AB
KETONES UR: NEGATIVE mg/dL
Nitrite: NEGATIVE
PROTEIN: 100 mg/dL — AB
Specific Gravity, Urine: 1.009 (ref 1.005–1.030)
Urobilinogen, UA: 0.2 mg/dL (ref 0.0–1.0)
pH: 6 (ref 5.0–8.0)

## 2015-02-16 LAB — URINE MICROSCOPIC-ADD ON

## 2015-02-16 LAB — PROTIME-INR
INR: 0.99 (ref 0.00–1.49)
Prothrombin Time: 13.2 seconds (ref 11.6–15.2)

## 2015-02-16 LAB — MRSA PCR SCREENING: MRSA BY PCR: NEGATIVE

## 2015-02-16 LAB — APTT: APTT: 35 s (ref 24–37)

## 2015-02-16 MED ORDER — LINAGLIPTIN 5 MG PO TABS
5.0000 mg | ORAL_TABLET | Freq: Every day | ORAL | Status: DC
  Administered 2015-02-16 – 2015-02-20 (×5): 5 mg via ORAL
  Filled 2015-02-16 (×5): qty 1

## 2015-02-16 MED ORDER — GABAPENTIN 400 MG PO CAPS
800.0000 mg | ORAL_CAPSULE | Freq: Two times a day (BID) | ORAL | Status: DC
  Administered 2015-02-16 – 2015-02-18 (×5): 800 mg via ORAL
  Filled 2015-02-16 (×6): qty 2

## 2015-02-16 MED ORDER — ASPIRIN 325 MG PO TABS
325.0000 mg | ORAL_TABLET | Freq: Every day | ORAL | Status: DC
  Administered 2015-02-16 – 2015-02-20 (×5): 325 mg via ORAL
  Filled 2015-02-16 (×5): qty 1

## 2015-02-16 MED ORDER — CLONAZEPAM 0.5 MG PO TABS
0.5000 mg | ORAL_TABLET | Freq: Three times a day (TID) | ORAL | Status: DC | PRN
  Administered 2015-02-16 – 2015-02-19 (×4): 0.5 mg via ORAL
  Filled 2015-02-16 (×4): qty 1

## 2015-02-16 MED ORDER — LISINOPRIL 10 MG PO TABS
10.0000 mg | ORAL_TABLET | Freq: Every day | ORAL | Status: DC
  Administered 2015-02-16 – 2015-02-17 (×2): 10 mg via ORAL
  Filled 2015-02-16 (×2): qty 1

## 2015-02-16 MED ORDER — LATANOPROST 0.005 % OP SOLN
1.0000 [drp] | Freq: Every day | OPHTHALMIC | Status: DC
  Administered 2015-02-16 – 2015-02-19 (×4): 1 [drp] via OPHTHALMIC
  Filled 2015-02-16: qty 2.5

## 2015-02-16 MED ORDER — GLIMEPIRIDE 4 MG PO TABS
4.0000 mg | ORAL_TABLET | Freq: Every day | ORAL | Status: DC
  Administered 2015-02-16 – 2015-02-20 (×5): 4 mg via ORAL
  Filled 2015-02-16 (×6): qty 1

## 2015-02-16 MED ORDER — SIMVASTATIN 20 MG PO TABS
20.0000 mg | ORAL_TABLET | Freq: Every day | ORAL | Status: DC
  Administered 2015-02-16 – 2015-02-19 (×4): 20 mg via ORAL
  Filled 2015-02-16 (×4): qty 1

## 2015-02-16 MED ORDER — INSULIN GLARGINE 100 UNIT/ML ~~LOC~~ SOLN
30.0000 [IU] | Freq: Every day | SUBCUTANEOUS | Status: DC
  Administered 2015-02-16 – 2015-02-19 (×4): 30 [IU] via SUBCUTANEOUS
  Filled 2015-02-16 (×5): qty 0.3

## 2015-02-16 NOTE — Evaluation (Signed)
Speech Language Pathology Evaluation Patient Details Name: Tara Hopkins MRN: [id number] DOB: 10-07-1951 Today's Date: 02/16/2015 Time: 2440-1027 SLP Time Calculation (min) (ACUTE ONLY): 24 min  Problem List:  Patient Active Problem List   Diagnosis Date Noted  . Stroke 02/15/2015  . 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 Osteomyelitis of foot, right, acute 02/23/2014  . 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 Diabetic foot infection with ulcer and abcess, right 05/21/2013  . DM type 2 with diabetic peripheral neuropathy 05/21/2013  . Benign hypertension 05/21/2013  . Depression 05/21/2013  . Hypokalemia 05/21/2013  . Hyponatremia 05/21/2013  . Anemia 05/21/2013  . Renal insufficiency 05/21/2013   Past Medical History:  Past Medical History  Diagnosis Date  . Diabetes mellitus without complication   . Hypercholesteremia   . Peripheral neuropathy   . Hypertension     not on medications any longer  . Anxiety   . Depression   . History of blood transfusion   . History of hyperbaric oxygen therapy     pt reports 80 treatments.  . Anemia   . Glaucoma    Past Surgical History:  Past Surgical History  Procedure Laterality Date  . I&d extremity Right 05/23/2013    Procedure: IRRIGATION AND DEBRIDEMENT RIGHT GREAT TOE;  Surgeon: Mauri Pole, MD;  Location: [facility];  Service: Orthopedics;  Laterality: Right;  . Amputation Right 12/15/2013    Procedure: Right Transmetatarsal Amputation;  Surgeon: Newt Minion, MD;  Location: [place];  Service: Orthopedics;  Laterality: Right;  . Amputation Right 02/23/2014    Procedure: AMPUTATION FOOT;  Surgeon: Newt Minion, MD;  Location: [place];  Service: Orthopedics;  Laterality: Right;  Right Foot Revision Transmetatarsal Amputation   HPI:  Tara Hopkins is an 64 y.o. female with history of HTN, DM, HLD presenting with acute onset of left sided weakness. Symptoms started at 2130 when she  developed left sided weakness LE > UE. Denies any sensory or visual changes. She notes some slurred speech.  CT head imaging reviewed, no signs of acute stroke.  MRI is still pending.     Assessment / Plan / Recommendation Clinical Impression  The MOCA-B was administered to assess the pt's cognitive skills.  The pt scored a 21/30 indicating a mild cognitive deficit.  Deficits on the MOCA-B were noted in executive functioning, recall given interference and word fluency.  The pt reports that she lives alone, however that her girlfriend does assist with recall for appts.  Recommend f/u ST to address cognitive deficits and to ensure pt safetey at home.  Pt would benefit from [place].      SLP Assessment  Patient needs continued Speech Lanaguage Pathology Services    Follow Up Recommendations  Home health SLP    Frequency and Duration min 2x/week  2 weeks   Pertinent Vitals/Pain Pain Assessment: No/denies pain   SLP Goals  Patient/Family Stated Goal: none stated Potential to Achieve Goals (ACUTE ONLY): Good  SLP Evaluation Prior Functioning  Cognitive/Linguistic Baseline: Within functional limits Type of Home: House  Lives With: Alone (Pt has a girlfriend that helps her out.) Available Help at Discharge: Friend(s) Education: HS   Cognition  Overall Cognitive Status: Impaired/Different from baseline Arousal/Alertness: Awake/alert Orientation Level: Oriented X4 Attention: Sustained Sustained Attention: Appears intact Memory: Impaired Memory Impairment: Decreased recall of new information;Decreased short term memory Decreased Short Term Memory: Verbal basic Awareness: Appears intact Problem Solving: Appears intact Executive Function: Decision Making  Decision Making: Appears intact Safety/Judgment: Appears intact [employer] Scales of Cognitive Functioning: Automatic/appropriate    Comprehension  Auditory Comprehension Overall Auditory Comprehension: Appears within functional  limits for tasks assessed Yes/No Questions: Not tested Commands: Within Functional Limits Conversation: Simple Visual Recognition/Discrimination Discrimination: Not tested Reading Comprehension Reading Status: Not tested    Expression Expression Primary Mode of Expression: Verbal Verbal Expression Overall Verbal Expression: Appears within functional limits for tasks assessed Initiation: No impairment Automatic Speech: Name;Social Response Level of Generative/Spontaneous Verbalization: Conversation Naming: No impairment Pragmatics: No impairment Non-Verbal Means of Communication: Not applicable Written Expression Dominant Hand: Right Written Expression: Not tested   Oral / Motor Motor Speech Overall Motor Speech: Appears within functional limits for tasks assessed Respiration: Within functional limits Phonation: Normal Resonance: Within functional limits Articulation: Within functional limitis Intelligibility: Intelligible Motor Planning: Witnin functional limits Motor Speech Errors: Not applicable   GO     [name] 02/16/2015, 4:07 PM  Shelly Flatten, [place], [place] [facility] SLP [phone]

## 2015-02-16 NOTE — Progress Notes (Signed)
PT Cancellation Note  Patient Details Name: Tara Hopkins MRN: [id number] DOB: 1951/07/17   Cancelled Treatment:    Reason Eval/Treat Not Completed: Patient not medically ready.  Pt on strict bedrest post tpa.  Will hold PT at this time and f/u as appropriate.     Pallas Wahlert, Thornton Papas 02/16/2015, 7:45 AM

## 2015-02-16 NOTE — Progress Notes (Signed)
eLink Physician-Brief Progress Note Patient Name: Tara Hopkins DOB: 04-27-1951 MRN: [id number]   Date of Service  02/16/2015  HPI/Events of Note  27 F with PMH sign for HTN/DM presenting with left sided weakness s/s of CVA with normal CT scan.  Given tPA.  Now HD stable in no acute distress.  eICU Interventions  Plans per primary Neuro team Continue to monitor via [facility]     Intervention Category Evaluation Type: New Patient Evaluation  Tesla Keeler 02/16/2015, 12:34 AM

## 2015-02-16 NOTE — Progress Notes (Signed)
  Echocardiogram 2D Echocardiogram has been performed.  Tara Hopkins 02/16/2015, 8:57 AM

## 2015-02-16 NOTE — Progress Notes (Signed)
Stroke Team Progress Note  HISTORY  Tara Hopkins is an 64 y.o. female with history of HTN, DM, HLD presenting with acute onset of left sided weakness. Notes symptoms started at 2130 when she developed left sided weakness LE > UE. Denies any sensory or visual changes. Notes some slurred speech. Upon EMS arrival noted BP of 220/130, CBG of 224. CT head imaging reviewed, no signs of acute stroke. Given labetalol 10mg  IV x 1 with improvement of BP to 167/71.   Date last known well: 02/15/2015 Time last known well: 2130 tPA Given: yes, initial NIHSS of 5   SUBJECTIVE No family is at bedside. The patient is alert, without significant complaints, no headache or vision changes. The patient believes that the left arm has improved with strength some from yesterday.  OBJECTIVE Most recent Vital Signs: Filed Vitals:   02/16/15 0600 02/16/15 0630 02/16/15 0700 02/16/15 0745  BP: 152/67 144/83 142/85   Pulse: 71 80 82   Temp:    98 F (36.7 C)  TempSrc:    Oral  Resp:  16 14   Height:      Weight:      SpO2: 99% 100% 97%    CBG (last 3)   Recent Labs  02/15/15 2255 02/16/15 0744  GLUCAP 239* 174*    IV Fluid Intake:   . sodium chloride 50 mL/hr at 02/16/15 0700    MEDICATIONS  .  stroke: mapping our early stages of recovery book   Does not apply Once  . pantoprazole (PROTONIX) IV  40 mg Intravenous QHS   PRN:  acetaminophen **OR** acetaminophen, labetalol, senna-docusate  Diet:  Diet Carb Modified Fluid consistency:: Thin; Room service appropriate?: Yes thin liquids Activity:  Bedrest DVT Prophylaxis:  SCD  CLINICALLY SIGNIFICANT STUDIES Basic Metabolic Panel:  Recent Labs Lab 02/15/15 2256 02/15/15 2259  NA 137 140  K 4.4 4.5  CL 106 103  CO2 23  --   GLUCOSE 247* 252*  BUN 21 24*  CREATININE 1.42* 1.40*  CALCIUM 9.4  --    Liver Function Tests:  Recent Labs Lab 02/15/15 2256  AST 22  ALT 23  ALKPHOS 93  BILITOT 0.4  PROT 6.9  ALBUMIN 3.8   CBC:   Recent Labs Lab 02/15/15 2256 02/15/15 2259  WBC 9.8  --   NEUTROABS 5.3  --   HGB 12.5 13.9  HCT 38.7 41.0  MCV 89.4  --   PLT 312  --    Coagulation:  Recent Labs Lab 02/15/15 2256  LABPROT 13.2  INR 0.99   Cardiac Enzymes: No results for input(s): CKTOTAL, CKMB, CKMBINDEX, TROPONINI in the last 168 hours. Urinalysis:  Recent Labs Lab 02/15/15 2355  COLORURINE YELLOW  LABSPEC 1.009  PHURINE 6.0  GLUCOSEU 100*  HGBUR SMALL*  BILIRUBINUR NEGATIVE  KETONESUR NEGATIVE  PROTEINUR 100*  UROBILINOGEN 0.2  NITRITE NEGATIVE  LEUKOCYTESUR TRACE*   Lipid Panel    Component Value Date/Time   CHOL 195 02/16/2015 0300   TRIG 341* 02/16/2015 0300   HDL 39* 02/16/2015 0300   CHOLHDL 5.0 02/16/2015 0300   VLDL 68* 02/16/2015 0300   LDLCALC 88 02/16/2015 0300   HgbA1C  Lab Results  Component Value Date   HGBA1C 8.3* 08/20/2014    Urine Drug Screen:     Component Value Date/Time   LABOPIA NONE DETECTED 02/15/2015 2355   COCAINSCRNUR POSITIVE* 02/15/2015 2355   LABBENZ NONE DETECTED 02/15/2015 2355   AMPHETMU NONE DETECTED 02/15/2015 2355  THCU NONE DETECTED 02/15/2015 2355   LABBARB NONE DETECTED 02/15/2015 2355    Alcohol Level:  Recent Labs Lab 02/15/15 2257  ETH <5    Ct Head Wo Contrast  02/15/2015   CLINICAL DATA:  Code stroke. Left-sided weakness and left facial droop.  EXAM: CT HEAD WITHOUT CONTRAST  TECHNIQUE: Contiguous axial images were obtained from the base of the skull through the vertex without intravenous contrast.  COMPARISON:  None.  FINDINGS: The basal ganglia are intact. No acute cortical infarct is present. Mild periventricular white matter changes are noted bilaterally. These are somewhat advanced for age. The ventricles are of normal size. No significant extraaxial fluid collection is present.  The paranasal sinuses and mastoid air cells are clear. The globes and orbits are intact. The calvarium is unremarkable. No significant extracranial  soft tissue injury is evident.  ASPECTS score = 10/10  [place] Stroke Program Early CT Score  Normal score = 10  IMPRESSION: 1. Mild periventricular white matter changes bilaterally. 2. No acute intracranial abnormality. No evidence for acute infarct.   Electronically Signed   By: San Morelle M.D.   On: 02/15/2015 23:10    CT of the brain   IMPRESSION: 1. Mild periventricular white matter changes bilaterally. 2. No acute intracranial abnormality. No evidence for acute infarct.  MRI of the brain    MRA of the brain    2D Echocardiogram    Carotid Doppler    CXR    EKG  Sinus rhythm Left atrial enlargement Probable LVH with secondary repol abnrm Lateral infarct, acute Baseline wander When compared with ECG of 12/15/2013,  Therapy Recommendations Pending  Physical Exam  General: The patient is alert and cooperative at the time of the examination.  Respiratory: Clear bilaterally  Cardiovascular: Regular rate and rhythm, no murmurs  Abdomen: Soft, nontender, positive bowel sounds  Skin: No significant peripheral edema is noted. 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 The patient has apparent normal recent and remote memory, with an apparently normal attention span and concentration ability.   Cranial nerves: Facial symmetry is present. Speech is normal, no aphasia or dysarthria is noted. Extraocular movements are full. Visual fields are full.  Motor: The patient has good strength in the right  extremities. on the left side, the patient has normal or near-normal strength of the left lower extremity, with the left upper extremity, there is 4+/5 strength drift seen  Sensory examination: Soft touch sensation is symmetric on the face, arms, and legs.  Coordination: The patient has good finger-nose-finger and heel-to-shin bilaterally, with  exception that she has difficulty performing finger nose finger on the left .  Gait and station: The gait was not tested.  Reflexes: Deep tendon reflexes are symmetric.    ASSESSMENT Ms. Tara Hopkins is a 64 y.o. female presenting with left hemiparesis. Status post IV t-PA on admission. The patient has had some improvement following TPA. The patient had significant hypertension on admission, now has weakness primarily on the left upper extremity. The patient minutes to cocaine use 3-4 days prior to onset of deficits.    Left hemiparesis, arm greater than leg  Hypertension, uncontrolled  cocaine abuse  Diabetes  Dyslipidemia  Anxiety and depression  Chronic renal insufficiency  Peripheral neuropathy  Hospital day # 1  TREATMENT/PLAN  Aspirin therapy following 24 hour period  MRI brain  MRA head  2-D echocardiogram  Carotid Doppler study  Counseled patient concerning cocaine abuse  Restart medications for diabetes, hypertension, and dyslipidemia  Monitor blood pressure  Physical, occupational therapy evaluation, patient may be a good rehabilitation candidate  The patient will be monitored in the ICU overnight, plan to transfer to the floor in the morning  Anniemae Haberkorn KEITH  02/16/2015 8:28 AM  [phone]

## 2015-02-17 LAB — CBC WITH DIFFERENTIAL/PLATELET
Basophils Absolute: 0 10*3/uL (ref 0.0–0.1)
Basophils Relative: 0 % (ref 0–1)
Eosinophils Absolute: 0.2 10*3/uL (ref 0.0–0.7)
Eosinophils Relative: 2 % (ref 0–5)
HCT: 33.5 % — ABNORMAL LOW (ref 36.0–46.0)
HEMOGLOBIN: 10.7 g/dL — AB (ref 12.0–15.0)
Lymphocytes Relative: 38 % (ref 12–46)
Lymphs Abs: 2.6 10*3/uL (ref 0.7–4.0)
MCH: 28.3 pg (ref 26.0–34.0)
MCHC: 32.2 g/dL (ref 30.0–36.0)
MCV: 87.9 fL (ref 78.0–100.0)
Monocytes Absolute: 0.4 10*3/uL (ref 0.1–1.0)
Monocytes Relative: 5 % (ref 3–12)
Neutro Abs: 3.7 10*3/uL (ref 1.7–7.7)
Neutrophils Relative %: 54 % (ref 43–77)
Platelets: 262 10*3/uL (ref 150–400)
RBC: 3.81 MIL/uL — ABNORMAL LOW (ref 3.87–5.11)
RDW: 13.7 % (ref 11.5–15.5)
WBC: 6.8 10*3/uL (ref 4.0–10.5)

## 2015-02-17 LAB — GLUCOSE, CAPILLARY
Glucose-Capillary: 129 mg/dL — ABNORMAL HIGH (ref 70–99)
Glucose-Capillary: 180 mg/dL — ABNORMAL HIGH (ref 70–99)
Glucose-Capillary: 177 mg/dL — ABNORMAL HIGH (ref 70–99)
Glucose-Capillary: 242 mg/dL — ABNORMAL HIGH (ref 70–99)

## 2015-02-17 LAB — COMPREHENSIVE METABOLIC PANEL
ALBUMIN: 3 g/dL — AB (ref 3.5–5.2)
ALK PHOS: 77 U/L (ref 39–117)
ALT: 16 U/L (ref 0–35)
ANION GAP: 7 (ref 5–15)
AST: 14 U/L (ref 0–37)
BUN: 19 mg/dL (ref 6–23)
CO2: 25 mmol/L (ref 19–32)
CREATININE: 1.45 mg/dL — AB (ref 0.50–1.10)
Calcium: 8.8 mg/dL (ref 8.4–10.5)
Chloride: 107 mmol/L (ref 96–112)
GFR calc Af Amer: 44 mL/min — ABNORMAL LOW (ref >90)
GFR calc non Af Amer: 38 mL/min — ABNORMAL LOW (ref >90)
GLUCOSE: 163 mg/dL — AB (ref 70–99)
Potassium: 4 mmol/L (ref 3.5–5.1)
Sodium: 139 mmol/L (ref 135–145)
TOTAL PROTEIN: 5.8 g/dL — AB (ref 6.0–8.3)
Total Bilirubin: 0.4 mg/dL (ref 0.3–1.2)

## 2015-02-17 MED ORDER — PANTOPRAZOLE SODIUM 40 MG PO TBEC
40.0000 mg | DELAYED_RELEASE_TABLET | Freq: Every day | ORAL | Status: DC
  Administered 2015-02-17 – 2015-02-19 (×3): 40 mg via ORAL
  Filled 2015-02-17 (×4): qty 1

## 2015-02-17 MED ORDER — ENOXAPARIN SODIUM 40 MG/0.4ML ~~LOC~~ SOLN
40.0000 mg | SUBCUTANEOUS | Status: DC
  Administered 2015-02-17 – 2015-02-19 (×3): 40 mg via SUBCUTANEOUS
  Filled 2015-02-17 (×3): qty 0.4

## 2015-02-17 MED ORDER — COLLAGENASE 250 UNIT/GM EX OINT
TOPICAL_OINTMENT | Freq: Every day | CUTANEOUS | Status: DC
  Administered 2015-02-17 – 2015-02-18 (×2): via TOPICAL
  Administered 2015-02-19: 1 via TOPICAL
  Administered 2015-02-20: 11:00:00 via TOPICAL
  Filled 2015-02-17: qty 30

## 2015-02-17 NOTE — Progress Notes (Signed)
PT Cancellation Note  Patient Details Name: Tara Hopkins MRN: [id number] DOB: [birth year]   Cancelled Treatment:    Reason Eval/Treat Not Completed: Patient not medically ready.  Pt continues to be on strict bedrest and will need updated activity order for PT and mobility.  Will f/u as appropriate.     Katrinna Travieso, [place] Papas 02/17/2015, 1:27 PM

## 2015-02-17 NOTE — Progress Notes (Signed)
Report called to 4N receiving nurse.  Patient will be transferred via bed on tele to room 4N 19. [place], [place]

## 2015-02-17 NOTE — Progress Notes (Signed)
Bilateral carotid artery duplex completed.  Right:  1-39% ICA stenosis.  Left:  40-59% (high end of range) internal carotid artery stenosis. Bilateral:  Vertebral artery flow is antegrade.

## 2015-02-17 NOTE — Progress Notes (Signed)
PHARMACIST - PHYSICIAN COMMUNICATION DR:   Leonie Man CONCERNING: Protonix IV to Oral Route Change Policy  RECOMMENDATION: This patient is receiving Protonix by the intravenous route.  Based on criteria approved by the Pharmacy and Therapeutics Committee, this drug is being converted to the equivalent oral dose form(s).  DESCRIPTION: These criteria include:  The patient is eating (either orally or via tube) and/or has been taking other orally administered medications for a least 24 hours  There is no active GI bleed or impaired GI absorption noted.   If you have questions about this conversion, please contact the Pharmacy Department  []   [phone] )  [facility] [x]   [phone] )  [facility]  []   [phone] )  [facility] []   [phone] )  [place], PharmD, BCPS Clinical Pharmacist Pager: [phone] 02/17/2015 4:39 PM

## 2015-02-17 NOTE — Progress Notes (Signed)
Stroke Team Progress Note  HISTORY  Atiyana Wheless is an 64 y.o. female with history of HTN, DM, HLD presenting with acute onset of left sided weakness. Notes symptoms started at 2130 when she developed left sided weakness LE > UE. Denies any sensory or visual changes. Notes some slurred speech. Upon EMS arrival noted BP of 220/130, CBG of 224. CT head imaging reviewed, no signs of acute stroke. Given labetalol 10mg  IV x 1 with improvement of BP to 167/71.   Date last known well: 02/15/2015 Time last known well: 2130 tPA Given: yes, initial NIHSS of 5   SUBJECTIVE No family is at bedside. The patient is alert, without significant complaints, no headache or vision changes. The patient believes that the left arm has improved with strength some from yesterday.  OBJECTIVE Most recent Vital Signs: Filed Vitals:   02/17/15 0600 02/17/15 0700 02/17/15 0752 02/17/15 0800  BP: 159/68 163/70  156/72  Pulse: 72 81  59  Temp:   98.4 F (36.9 C)   TempSrc:   Oral   Resp: 12 11  15   Height:      Weight:      SpO2: 99% 98%  100%   CBG (last 3)   Recent Labs  02/16/15 1121 02/16/15 1704 02/16/15 2151  GLUCAP 232* 253* 237*    IV Fluid Intake:   . sodium chloride 50 mL/hr at 02/17/15 0800    MEDICATIONS  .  stroke: mapping our early stages of recovery book   Does not apply Once  . aspirin  325 mg Oral Daily  . gabapentin  800 mg Oral BID  . glimepiride  4 mg Oral QAC breakfast  . insulin glargine  30 Units Subcutaneous QHS  . latanoprost  1 drop Both Eyes QHS  . linagliptin  5 mg Oral Daily  . lisinopril  10 mg Oral Daily  . pantoprazole (PROTONIX) IV  40 mg Intravenous QHS  . simvastatin  20 mg Oral QHS   PRN:  acetaminophen **OR** acetaminophen, clonazePAM, labetalol, senna-docusate  Diet:  Diet Carb Modified Fluid consistency:: Thin; Room service appropriate?: Yes thin liquids Activity:  Bedrest DVT Prophylaxis:  SCD  CLINICALLY SIGNIFICANT STUDIES Basic Metabolic  Panel:   Recent Labs Lab 02/15/15 2256 02/15/15 2259 02/17/15 0403  NA 137 140 139  K 4.4 4.5 4.0  CL 106 103 107  CO2 23  --  25  GLUCOSE 247* 252* 163*  BUN 21 24* 19  CREATININE 1.42* 1.40* 1.45*  CALCIUM 9.4  --  8.8   Liver Function Tests:   Recent Labs Lab 02/15/15 2256 02/17/15 0403  AST 22 14  ALT 23 16  ALKPHOS 93 77  BILITOT 0.4 0.4  PROT 6.9 5.8*  ALBUMIN 3.8 3.0*   CBC:   Recent Labs Lab 02/15/15 2256 02/15/15 2259 02/17/15 0403  WBC 9.8  --  6.8  NEUTROABS 5.3  --  3.7  HGB 12.5 13.9 10.7*  HCT 38.7 41.0 33.5*  MCV 89.4  --  87.9  PLT 312  --  262   Coagulation:   Recent Labs Lab 02/15/15 2256  LABPROT 13.2  INR 0.99   Cardiac Enzymes: No results for input(s): CKTOTAL, CKMB, CKMBINDEX, TROPONINI in the last 168 hours. Urinalysis:   Recent Labs Lab 02/15/15 2355  COLORURINE YELLOW  LABSPEC 1.009  PHURINE 6.0  GLUCOSEU 100*  HGBUR SMALL*  BILIRUBINUR NEGATIVE  KETONESUR NEGATIVE  PROTEINUR 100*  UROBILINOGEN 0.2  NITRITE NEGATIVE  LEUKOCYTESUR TRACE*  Lipid Panel    Component Value Date/Time   CHOL 195 02/16/2015 0300   TRIG 341* 02/16/2015 0300   HDL 39* 02/16/2015 0300   CHOLHDL 5.0 02/16/2015 0300   VLDL 68* 02/16/2015 0300   LDLCALC 88 02/16/2015 0300   HgbA1C  Lab Results  Component Value Date   HGBA1C 8.3* 08/20/2014    Urine Drug Screen:      Component Value Date/Time   LABOPIA NONE DETECTED 02/15/2015 2355   COCAINSCRNUR POSITIVE* 02/15/2015 2355   LABBENZ NONE DETECTED 02/15/2015 2355   AMPHETMU NONE DETECTED 02/15/2015 2355   THCU NONE DETECTED 02/15/2015 2355   LABBARB NONE DETECTED 02/15/2015 2355    Alcohol Level:   Recent Labs Lab 02/15/15 2257  ETH <5    Ct Head Wo Contrast  02/15/2015   CLINICAL DATA:  Code stroke. Left-sided weakness and left facial droop.  EXAM: CT HEAD WITHOUT CONTRAST  TECHNIQUE: Contiguous axial images were obtained from the base of the skull through the vertex  without intravenous contrast.  COMPARISON:  None.  FINDINGS: The basal ganglia are intact. No acute cortical infarct is present. Mild periventricular white matter changes are noted bilaterally. These are somewhat advanced for age. The ventricles are of normal size. No significant extraaxial fluid collection is present.  The paranasal sinuses and mastoid air cells are clear. The globes and orbits are intact. The calvarium is unremarkable. No significant extracranial soft tissue injury is evident.  ASPECTS score = 10/10  [place] Stroke Program Early CT Score  Normal score = 10  IMPRESSION: 1. Mild periventricular white matter changes bilaterally. 2. No acute intracranial abnormality. No evidence for acute infarct.   Electronically Signed   By: San Morelle M.D.   On: 02/15/2015 23:10   Mr Brain Wo Contrast  02/16/2015   CLINICAL DATA:  64 year old hypertensive diabetic female with hyperlipidemia presenting with acute onset of left-sided weakness. Subsequent encounter.  EXAM: MRI HEAD WITHOUT CONTRAST  MRA HEAD WITHOUT CONTRAST  TECHNIQUE: Multiplanar, multiecho pulse sequences of the brain and surrounding structures were obtained without intravenous contrast. Angiographic images of the head were obtained using MRA technique without contrast.  COMPARISON:  02/15/2015 head CT.  No comparison brain MR.  FINDINGS: MRI HEAD FINDINGS  Acute nonhemorrhagic infarct extends from the superior posterior aspect of the right lenticular nucleus into the posterior corona radiata.  Small acute nonhemorrhagic infarct mid posterior limb of the left internal capsule.  Remote right thalamic infarct.  Moderate small vessel disease type changes.  No intracranial mass lesion noted on this unenhanced exam.  Mild atrophy without hydrocephalus.  Mild exophthalmos.  Mild spinal stenosis C3-4 level.  Transverse ligament hypertrophy.  Cervical medullary junction, pituitary region and pineal region unremarkable.  Minimal paranasal sinus  mucosal thickening.  MRA HEAD FINDINGS  Mild to moderate narrowing at the junction of the left internal carotid artery cavernous/pre cavernous segment.  Mild narrowing right internal carotid artery cavernous/ pre cavernous sinus junction.  Mild ectasia of the cavernous segment of the internal carotid artery bilaterally.  Mild narrowing supraclinoid segment of the internal carotid artery bilaterally.  Middle cerebral artery moderate branch vessel narrowing and irregularity bilaterally.  Ectatic dominant right vertebral artery without high-grade stenosis.  Left vertebral artery predominantly ends in a posterior inferior cerebellar artery distribution with only small vessel extending to form the basilar artery.  Nonvisualized right posterior inferior cerebellar artery.  Ectatic slightly irregular basilar artery with mild narrowing.  Nonvisualized left anterior inferior cerebellar artery.  Posterior  cerebral artery distal branch vessel irregularity with moderate narrowing greater on the left.  No aneurysm noted.  IMPRESSION: MRI HEAD  Acute nonhemorrhagic infarct extends from the superior posterior aspect of the right lenticular nucleus into the posterior corona radiata.  Small acute nonhemorrhagic infarct mid posterior limb of the left internal capsule.  Remote right thalamic infarct.  Moderate small vessel disease type changes.  Mild atrophy.  Mild exophthalmos.  Mild spinal stenosis C3-4 level.  MRA HEAD FINDINGS  Intracranial atherosclerotic type changes as detailed above.   Electronically Signed   By: Genia Del M.D.   On: 02/16/2015 23:24   Mr Jodene Nam Head/brain Wo Cm  02/16/2015   CLINICAL DATA:  64 year old hypertensive diabetic female with hyperlipidemia presenting with acute onset of left-sided weakness. Subsequent encounter.  EXAM: MRI HEAD WITHOUT CONTRAST  MRA HEAD WITHOUT CONTRAST  TECHNIQUE: Multiplanar, multiecho pulse sequences of the brain and surrounding structures were obtained without intravenous  contrast. Angiographic images of the head were obtained using MRA technique without contrast.  COMPARISON:  02/15/2015 head CT.  No comparison brain MR.  FINDINGS: MRI HEAD FINDINGS  Acute nonhemorrhagic infarct extends from the superior posterior aspect of the right lenticular nucleus into the posterior corona radiata.  Small acute nonhemorrhagic infarct mid posterior limb of the left internal capsule.  Remote right thalamic infarct.  Moderate small vessel disease type changes.  No intracranial mass lesion noted on this unenhanced exam.  Mild atrophy without hydrocephalus.  Mild exophthalmos.  Mild spinal stenosis C3-4 level.  Transverse ligament hypertrophy.  Cervical medullary junction, pituitary region and pineal region unremarkable.  Minimal paranasal sinus mucosal thickening.  MRA HEAD FINDINGS  Mild to moderate narrowing at the junction of the left internal carotid artery cavernous/pre cavernous segment.  Mild narrowing right internal carotid artery cavernous/ pre cavernous sinus junction.  Mild ectasia of the cavernous segment of the internal carotid artery bilaterally.  Mild narrowing supraclinoid segment of the internal carotid artery bilaterally.  Middle cerebral artery moderate branch vessel narrowing and irregularity bilaterally.  Ectatic dominant right vertebral artery without high-grade stenosis.  Left vertebral artery predominantly ends in a posterior inferior cerebellar artery distribution with only small vessel extending to form the basilar artery.  Nonvisualized right posterior inferior cerebellar artery.  Ectatic slightly irregular basilar artery with mild narrowing.  Nonvisualized left anterior inferior cerebellar artery.  Posterior cerebral artery distal branch vessel irregularity with moderate narrowing greater on the left.  No aneurysm noted.  IMPRESSION: MRI HEAD  Acute nonhemorrhagic infarct extends from the superior posterior aspect of the right lenticular nucleus into the posterior corona  radiata.  Small acute nonhemorrhagic infarct mid posterior limb of the left internal capsule.  Remote right thalamic infarct.  Moderate small vessel disease type changes.  Mild atrophy.  Mild exophthalmos.  Mild spinal stenosis C3-4 level.  MRA HEAD FINDINGS  Intracranial atherosclerotic type changes as detailed above.   Electronically Signed   By: Genia Del M.D.   On: 02/16/2015 23:24    CT of the brain   IMPRESSION: 1. Mild periventricular white matter changes bilaterally. 2. No acute intracranial abnormality. No evidence for acute infarct.  MRI of the brain   IMPRESSION: MRI HEAD  Acute nonhemorrhagic infarct extends from the superior posterior aspect of the right lenticular nucleus into the posterior corona radiata.  Small acute nonhemorrhagic infarct mid posterior limb of the left internal capsule.  Remote right thalamic infarct.  Moderate small vessel disease type changes.  Mild atrophy.  Mild  exophthalmos.  Mild spinal stenosis C3-4 level.  MRA HEAD FINDINGS  Intracranial atherosclerotic type changes as detailed above.  MRA of the brain   See above 2D Echocardiogram   Study Conclusions  - Left ventricle: The cavity size was normal. Wall thickness was increased in a pattern of severe LVH. Systolic function was normal. The estimated ejection fraction was in the range of 55% to 60%. Wall motion was normal; there were no regional wall motion abnormalities. Doppler parameters are consistent with abnormal left ventricular relaxation (grade 1 diastolic dysfunction). - Atrial septum: No defect or patent foramen ovale was identified. Carotid Doppler   pending  CXR    EKG  Sinus rhythm Left atrial enlargement Probable LVH with secondary repol abnrm Lateral infarct, acute Baseline wander When compared with ECG of 12/15/2013,  Therapy Recommendations Pending  Physical Exam  General: The patient is alert and cooperative at the time of  the examination.  Respiratory: Clear bilaterally  Cardiovascular: Regular rate and rhythm, no murmurs  Abdomen: Soft, nontender, positive bowel sounds  Skin: No significant peripheral edema is noted. 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 The patient has apparent normal recent and remote memory, with an apparently normal attention span and concentration ability.   Cranial nerves: Facial symmetry is present. Speech is normal, no aphasia or dysarthria is noted. Extraocular movements are full. Visual fields are full.  Motor: The patient has good strength in the right  extremities. on the left side, the patient has normal or near-normal strength of the left lower extremity, with the left upper extremity, there is 4+/5 strength drift seen  Sensory examination: Soft touch sensation is symmetric on the face, arms, and legs.  Coordination: The patient has good finger-nose-finger and heel-to-shin bilaterally, with exception that she has difficulty performing finger nose finger on the left .  Gait and station: The gait was not tested.  Reflexes: Deep tendon reflexes are symmetric.    ASSESSMENT Ms. Sarabella Caprio is a 64 y.o. female presenting with left hemiparesis. Status post IV t-PA on admission. The patient has had some improvement following TPA. The patient had significant hypertension on admission, now has weakness primarily on the left upper extremity. The patient minutes to cocaine use 3-4 days prior to onset of deficits.    Left hemiparesis, arm greater than leg  Hypertension, uncontrolled  cocaine abuse  Diabetes  Dyslipidemia  Anxiety and depression  Chronic renal insufficiency  Peripheral neuropathy  Hospital day # 2  TREATMENT/PLAN  Aspirin therapy   Carotid Doppler study  Counseled patient concerning cocaine abuse  Restart  medications for diabetes, hypertension, and dyslipidemia  Monitor blood pressure  Physical, occupational therapy evaluation, patient may be a good rehabilitation candidate   transfer to the floor in the morning  Consider TEE, possible loop recorder given bihemispheric strokes  Wound care to right leg/santyl  WILLIS,CHARLES KEITH  02/17/2015 8:48 AM  [phone]

## 2015-02-18 ENCOUNTER — Encounter (HOSPITAL_COMMUNITY): Payer: Self-pay | Admitting: Radiology

## 2015-02-18 ENCOUNTER — Inpatient Hospital Stay (HOSPITAL_COMMUNITY): Payer: BC Managed Care – PPO

## 2015-02-18 DIAGNOSIS — E785 Hyperlipidemia, unspecified: Secondary | ICD-10-CM

## 2015-02-18 DIAGNOSIS — I63312 Cerebral infarction due to thrombosis of left middle cerebral artery: Secondary | ICD-10-CM

## 2015-02-18 DIAGNOSIS — I10 Essential (primary) hypertension: Secondary | ICD-10-CM

## 2015-02-18 DIAGNOSIS — G8190 Hemiplegia, unspecified affecting unspecified side: Secondary | ICD-10-CM

## 2015-02-18 DIAGNOSIS — I639 Cerebral infarction, unspecified: Secondary | ICD-10-CM

## 2015-02-18 DIAGNOSIS — E1159 Type 2 diabetes mellitus with other circulatory complications: Secondary | ICD-10-CM

## 2015-02-18 LAB — GLUCOSE, CAPILLARY
GLUCOSE-CAPILLARY: 144 mg/dL — AB (ref 70–99)
Glucose-Capillary: 151 mg/dL — ABNORMAL HIGH (ref 70–99)
Glucose-Capillary: 148 mg/dL — ABNORMAL HIGH (ref 70–99)
Glucose-Capillary: 196 mg/dL — ABNORMAL HIGH (ref 70–99)

## 2015-02-18 LAB — HEMOGLOBIN A1C
Hgb A1c MFr Bld: 8.9 % — ABNORMAL HIGH (ref 4.8–5.6)
MEAN PLASMA GLUCOSE: 209 mg/dL

## 2015-02-18 MED ORDER — LISINOPRIL 20 MG PO TABS
20.0000 mg | ORAL_TABLET | Freq: Every day | ORAL | Status: DC
  Administered 2015-02-18 – 2015-02-19 (×2): 20 mg via ORAL
  Filled 2015-02-18 (×2): qty 1

## 2015-02-18 MED ORDER — GABAPENTIN 300 MG PO CAPS
600.0000 mg | ORAL_CAPSULE | Freq: Two times a day (BID) | ORAL | Status: DC
  Administered 2015-02-18 – 2015-02-20 (×4): 600 mg via ORAL
  Filled 2015-02-18 (×4): qty 2

## 2015-02-18 MED ORDER — IOHEXOL 350 MG/ML SOLN
100.0000 mL | Freq: Once | INTRAVENOUS | Status: AC | PRN
  Administered 2015-02-18: 100 mL via INTRAVENOUS

## 2015-02-18 MED ORDER — LAMOTRIGINE 100 MG PO TABS
50.0000 mg | ORAL_TABLET | Freq: Two times a day (BID) | ORAL | Status: DC
  Administered 2015-02-18 – 2015-02-20 (×5): 50 mg via ORAL
  Filled 2015-02-18 (×5): qty 1

## 2015-02-18 MED ORDER — INSULIN ASPART 100 UNIT/ML ~~LOC~~ SOLN
0.0000 [IU] | Freq: Three times a day (TID) | SUBCUTANEOUS | Status: DC
  Administered 2015-02-18: 2 [IU] via SUBCUTANEOUS
  Administered 2015-02-19: 8 [IU] via SUBCUTANEOUS
  Administered 2015-02-19: 4 [IU] via SUBCUTANEOUS
  Administered 2015-02-19: 2 [IU] via SUBCUTANEOUS
  Administered 2015-02-20: 4 [IU] via SUBCUTANEOUS
  Administered 2015-02-20: 2 [IU] via SUBCUTANEOUS

## 2015-02-18 NOTE — Consult Note (Signed)
Physical Medicine and Rehabilitation Consult  Reason for Consult: Left sided weakness Referring Physician:  Dr. [name]    HPI: Tara Hopkins is a 64 y.o. RH-female with history of HTN, DM, peripheral neuropathy, anxiety disorder; who was admitted via EMS on 02/15/15 with acute onset of left sided weakness and slurred speech.  Upon EMS evaluation BP  220/130 and CBG of 224. She was given labetalol 10mg  IV x 1 with improvement of BP to 167/71. UDS positive for cocaine. CT head negative and tPA administered.  MRI/MRA brain was done revealing acute nonhemorrhagic infarcts in posterior aspect of right lenticular nucleus to posterior corona radiata and posterior limb of L-IC. 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 2D echo showed severe LVH, EF 55-60% and no regional wall abnormalities. BLE dopplers negative for DVT.  Speech therapy evaluation reveals mild cognitive deficits in executive functioning and recall. She was started on ASA for secondary stroke prevention and TEE/loop recorder recommended for work up of bihemisphere strokes. 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 PT/OT evaluations pending  And CIR recommended by MD for follow up therapy.    Review of Systems  HENT: Negative for hearing loss.   Eyes: Negative for blurred vision and double vision.  Respiratory: Positive for cough. Negative for shortness of breath and wheezing.   Cardiovascular: Negative for chest pain and palpitations.  Gastrointestinal: Negative for heartburn and nausea.  Musculoskeletal: Positive for falls (was set to start OP therapy ). Negative for back pain and neck pain.       Gait disorder--does not like using AD.   Neurological: Positive for sensory change (numbness BLE due to neuropathy) and focal weakness. Negative for dizziness, tingling and headaches.      Past Medical History  Diagnosis Date  . Diabetes mellitus without complication     . Hypercholesteremia   . Peripheral neuropathy   . Hypertension     not on medications any longer  . Anxiety   . Depression   . History of blood transfusion   . History of hyperbaric oxygen therapy     pt reports 80 treatments.  . Anemia   . Glaucoma     Past Surgical History  Procedure Laterality Date  . I&d extremity Right 05/23/2013    Procedure: IRRIGATION AND DEBRIDEMENT RIGHT GREAT TOE;  Surgeon: Mauri Pole, MD;  Location: [facility];  Service: Orthopedics;  Laterality: Right;  . Amputation Right 12/15/2013    Procedure: Right Transmetatarsal Amputation;  Surgeon: Newt Minion, MD;  Location: [place];  Service: Orthopedics;  Laterality: Right;  . Amputation Right 02/23/2014    Procedure: AMPUTATION FOOT;  Surgeon: Newt Minion, MD;  Location: [place];  Service: Orthopedics;  Laterality: Right;  Right Foot Revision Transmetatarsal Amputation    Family History  Problem Relation Age of Onset  . Colon cancer Neg Hx   . Liver cancer Mother   . Lung cancer Mother     Social History:  Lives alone. Independent PTA--but unsafe without  AD (has cane, walker and WC) per family. Daughter and niece assist as needed. She reports that she has been smoking Cigarettes--1/2 PPD.  She has a .2 pack-year smoking history. She has never used smokeless tobacco. She reports that she drinks beer once a month.  She reports that she does not use illicit drugs.    Allergies: No Known Allergies    Medications Prior to Admission  Medication  Sig Dispense Refill  . acetaminophen (TYLENOL) 500 MG tablet Take 1,000 mg by mouth every 6 (six) hours as needed for headache.    . clonazePAM (KLONOPIN) 0.5 MG tablet take 1 tablet by mouth three times a day if needed (Patient taking differently: take 1 tablet by mouth three times a day if needed for anxiety) 90 tablet 1  . COD LIVER OIL PO Take 1 capsule by mouth daily.    . collagenase (SANTYL) ointment Apply 1 application topically at bedtime. Apply to right  ankle for wound care    . gabapentin (NEURONTIN) 400 MG capsule Take 2 capsules (800 mg total) by mouth 2 (two) times daily. 120 capsule 4  . glimepiride (AMARYL) 4 MG tablet Take 1 tablet (4 mg total) by mouth daily before breakfast. 90 tablet 0  . insulin aspart (NOVOLOG FLEXPEN) 100 UNIT/ML FlexPen Inject 2-6 units into skin 3 times daily (Patient taking differently: Inject 4 Units into the skin 3 (three) times daily as needed for high blood sugar (CBG>130). ) 15 mL 0  . insulin glargine (LANTUS) 100 UNIT/ML injection Inject 0.3 mLs (30 Units total) into the skin at bedtime. 10 mL 2  . lamoTRIgine (LAMICTAL) 100 MG tablet Take 0.5 tablets (50 mg total) by mouth 2 (two) times daily. 30 tablet 3  . latanoprost (XALATAN) 0.005 % ophthalmic solution place 1 drop into both eyes at bedtime 2.5 mL 12  . lisinopril (PRINIVIL,ZESTRIL) 10 MG tablet take 1 tablet by mouth once daily 30 tablet 3  . simvastatin (ZOCOR) 20 MG tablet Take 1 tablet (20 mg total) by mouth at bedtime. 90 tablet 0  . white petrolatum (VASELINE) GEL Apply 1 application topically at bedtime. Apply to both feet    . glucose blood test strip Use as instructed 100 each 12  . ONE TOUCH LANCETS MISC USE TO CHECK BLOOD SUGAR 3 TIMES DAILY 200 each 3  . rosuvastatin (CRESTOR) 10 MG tablet Take 1 tablet (10 mg total) by mouth daily. (Patient not taking: Reported on 02/16/2015) 90 tablet 0  . saxagliptin HCl (ONGLYZA) 5 MG TABS tablet Take 1 tablet (5 mg total) by mouth daily. (Patient not taking: Reported on 02/17/2015) 90 tablet 0  . zinc gluconate 50 MG tablet Take 50 mg by mouth 2 (two) times daily.      Home: Home Living Family/patient expects to be discharged to:: Private residence Living Arrangements: Alone Available Help at Discharge: Friend(s) Type of Home: House  Lives With: Alone (Pt has a girlfriend that helps her out.)  Functional History:   Functional Status:  Mobility:          ADL:     Cognition: Cognition Overall Cognitive Status: Impaired/Different from baseline Arousal/Alertness: Awake/alert Orientation Level: Oriented X4 Attention: Sustained Sustained Attention: Appears intact Memory: Impaired Memory Impairment: Decreased recall of new information, Decreased short term memory Decreased Short Term Memory: Verbal basic Awareness: Appears intact Problem Solving: Appears intact Executive Function: Decision Making Decision Making: Appears intact Safety/Judgment: Appears intact [employer] Scales of Cognitive Functioning: Automatic/appropriate Cognition Overall Cognitive Status: Impaired/Different from baseline  Blood pressure 141/92, pulse 69, temperature 98 F (36.7 C), temperature source Oral, resp. rate 16, height 5\' 5"  (1.651 m), weight 79.8 kg (175 lb 14.8 oz), SpO2 97 %. Physical Exam  Nursing note and vitals reviewed. Constitutional: She is oriented to person, place, and time. She appears well-developed and well-nourished.  HENT:  Head: Normocephalic and atraumatic.  Eyes: Conjunctivae are normal. Pupils are equal,  round, and reactive to light.  Neck: Normal range of motion. Neck supple.  Cardiovascular: Normal rate and regular rhythm.   Respiratory: Effort normal. She has wheezes. She exhibits no tenderness.  Congested cough noted.   GI: Soft. Bowel sounds are normal. She exhibits no distension. There is no tenderness.  Musculoskeletal: She exhibits no edema.  Old right transmet amputation site well healed.   Neurological: She is alert and oriented to person, place, and time.  Left facial weakness. Speech clear. Able to follow basic commands without difficulty.  Impulsivity noted with poor safety awareness. LUE:  3- deltoid, 3 bicep and tricep, 3 wrist and HI. LLE: 3 HF, 3KE and 3+ ankle. RUE and RLE 4 to 4+/5. Decreased PP and LT in both feet.   Skin: Skin is warm and dry.  Psychiatric: She has a normal mood and affect. Her behavior is normal.  Judgment and thought content normal.    Results for orders placed or performed during the hospital encounter of 02/15/15 (from the past 24 hour(s))  Glucose, capillary     Status: Abnormal   Collection Time: 02/17/15 11:13 AM  Result Value Ref Range   Glucose-Capillary 180 (H) 70 - 99 mg/dL  Glucose, capillary     Status: Abnormal   Collection Time: 02/17/15  4:15 PM  Result Value Ref Range   Glucose-Capillary 177 (H) 70 - 99 mg/dL  Glucose, capillary     Status: Abnormal   Collection Time: 02/17/15  9:44 PM  Result Value Ref Range   Glucose-Capillary 242 (H) 70 - 99 mg/dL   Comment 1 Notify RN    Comment 2 Document in Chart   Glucose, capillary     Status: Abnormal   Collection Time: 02/18/15  6:44 AM  Result Value Ref Range   Glucose-Capillary 151 (H) 70 - 99 mg/dL   Comment 1 Notify RN    Comment 2 Document in Chart    Ct Angio Neck W/cm &/or Wo/cm  02/18/2015   CLINICAL DATA:  Acute onset LEFT-sided weakness beginning at 2130 hours. Slurred speech. Hypertension. History of diabetes.  EXAM: CT ANGIOGRAPHY NECK  TECHNIQUE: Multidetector CT imaging of the neck was performed using the standard protocol during bolus administration of intravenous contrast. Multiplanar CT image reconstructions and MIPs were obtained to evaluate the vascular anatomy. Carotid stenosis measurements (when applicable) are obtained utilizing NASCET criteria, using the distal internal carotid diameter as the denominator.  CONTRAST:  173mL OMNIPAQUE IOHEXOL 350 MG/ML SOLN  COMPARISON:  MRI of the brain February 16, 2015  FINDINGS: Normal appearance of the thoracic arch, normal branch pattern. The origins of the innominate, left Common carotid artery and subclavian artery appear patent no, limited assessment due to streak artifact from retained LEFT subclavian venous contrast  Bilateral Common carotid arteries are widely patent, coursing in a straight line fashion. Eccentric intimal thickening and calcific atherosclerosis  of the LEFT greater than RIGHT carotid bulb resulting in 50% stenosis of LEFT internal carotid artery origin by NASCET criteria with poststenotic dilatation.  RIGHT vertebral artery is dominant. Normal appearance of the vertebral arteries, which appear widely patent.  No hemodynamically significant stenosis by NASCET criteria. No dissection, no pseudoaneurysm. No abnormal luminal irregularity. No contrast extravasation.  Soft tissues are nonacute. No acute osseous process though bone windows have not been submitted. Moderate upper cervical facet arthropathy. Moderate to severe C5-6 disc height loss, ventral endplate spurring and endplate sclerosis consistent with degenerative disc.  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 No large vessel occlusion.   Electronically Signed   By: Elon Alas   On: 02/18/2015 04:28   Mr Brain Wo Contrast  02/16/2015   CLINICAL DATA:  64 year old hypertensive diabetic female with hyperlipidemia presenting with acute onset of left-sided weakness. Subsequent encounter.  EXAM: MRI HEAD WITHOUT CONTRAST  MRA HEAD WITHOUT CONTRAST  TECHNIQUE: Multiplanar, multiecho pulse sequences of the brain and surrounding structures were obtained without intravenous contrast. Angiographic images of the head were obtained using MRA technique without contrast.  COMPARISON:  02/15/2015 head CT.  No comparison brain MR.  FINDINGS: MRI HEAD FINDINGS  Acute nonhemorrhagic infarct extends from the superior posterior aspect of the right lenticular nucleus into the posterior corona radiata.  Small acute nonhemorrhagic infarct mid posterior limb of the left internal capsule.  Remote right thalamic infarct.  Moderate small vessel disease type changes.  No intracranial mass lesion noted on this unenhanced exam.  Mild atrophy without hydrocephalus.  Mild exophthalmos.  Mild spinal stenosis C3-4 level.  Transverse ligament hypertrophy.  Cervical medullary junction, pituitary region and  pineal region unremarkable.  Minimal paranasal sinus mucosal thickening.  MRA HEAD FINDINGS  Mild to moderate narrowing at the junction of the left internal carotid artery cavernous/pre cavernous segment.  Mild narrowing right internal carotid artery cavernous/ pre cavernous sinus junction.  Mild ectasia of the cavernous segment of the internal carotid artery bilaterally.  Mild narrowing supraclinoid segment of the internal carotid artery bilaterally.  Middle cerebral artery moderate branch vessel narrowing and irregularity bilaterally.  Ectatic dominant right vertebral artery without high-grade stenosis.  Left vertebral artery predominantly ends in a posterior inferior cerebellar artery distribution with only small vessel extending to form the basilar artery.  Nonvisualized right posterior inferior cerebellar artery.  Ectatic slightly irregular basilar artery with mild narrowing.  Nonvisualized left anterior inferior cerebellar artery.  Posterior cerebral artery distal branch vessel irregularity with moderate narrowing greater on the left.  No aneurysm noted.  IMPRESSION: MRI HEAD  Acute nonhemorrhagic infarct extends from the superior posterior aspect of the right lenticular nucleus into the posterior corona radiata.  Small acute nonhemorrhagic infarct mid posterior limb of the left internal capsule.  Remote right thalamic infarct.  Moderate small vessel disease type changes.  Mild atrophy.  Mild exophthalmos.  Mild spinal stenosis C3-4 level.  MRA HEAD FINDINGS  Intracranial atherosclerotic type changes as detailed above.   Electronically Signed   By: Genia Del M.D.   On: 02/16/2015 23:24   Mr Jodene Nam Head/brain Wo Cm  02/16/2015   CLINICAL DATA:  64 year old hypertensive diabetic female with hyperlipidemia presenting with acute onset of left-sided weakness. Subsequent encounter.  EXAM: MRI HEAD WITHOUT CONTRAST  MRA HEAD WITHOUT CONTRAST  TECHNIQUE: Multiplanar, multiecho pulse sequences of the brain and  surrounding structures were obtained without intravenous contrast. Angiographic images of the head were obtained using MRA technique without contrast.  COMPARISON:  02/15/2015 head CT.  No comparison brain MR.  FINDINGS: MRI HEAD FINDINGS  Acute nonhemorrhagic infarct extends from the superior posterior aspect of the right lenticular nucleus into the posterior corona radiata.  Small acute nonhemorrhagic infarct mid posterior limb of the left internal capsule.  Remote right thalamic infarct.  Moderate small vessel disease type changes.  No intracranial mass lesion noted on this unenhanced exam.  Mild atrophy without hydrocephalus.  Mild exophthalmos.  Mild spinal stenosis C3-4 level.  Transverse ligament hypertrophy.  Cervical medullary junction, pituitary region and pineal region unremarkable.  Minimal paranasal sinus mucosal thickening.  MRA HEAD FINDINGS  Mild to moderate narrowing at the junction of the left internal carotid artery cavernous/pre cavernous segment.  Mild narrowing right internal carotid artery cavernous/ pre cavernous sinus junction.  Mild ectasia of the cavernous segment of the internal carotid artery bilaterally.  Mild narrowing supraclinoid segment of the internal carotid artery bilaterally.  Middle cerebral artery moderate branch vessel narrowing and irregularity bilaterally.  Ectatic dominant right vertebral artery without high-grade stenosis.  Left vertebral artery predominantly ends in a posterior inferior cerebellar artery distribution with only small vessel extending to form the basilar artery.  Nonvisualized right posterior inferior cerebellar artery.  Ectatic slightly irregular basilar artery with mild narrowing.  Nonvisualized left anterior inferior cerebellar artery.  Posterior cerebral artery distal branch vessel irregularity with moderate narrowing greater on the left.  No aneurysm noted.  IMPRESSION: MRI HEAD  Acute nonhemorrhagic infarct extends from the superior posterior aspect  of the right lenticular nucleus into the posterior corona radiata.  Small acute nonhemorrhagic infarct mid posterior limb of the left internal capsule.  Remote right thalamic infarct.  Moderate small vessel disease type changes.  Mild atrophy.  Mild exophthalmos.  Mild spinal stenosis C3-4 level.  MRA HEAD FINDINGS  Intracranial atherosclerotic type changes as detailed above.   Electronically Signed   By: Genia Del M.D.   On: 02/16/2015 23:24    Assessment/Plan: Diagnosis: hypertensive right lenticular nucleus/corona radiate infarct 1. Does the need for close, 24 hr/day medical supervision in concert with the patient's rehab needs make it unreasonable for this patient to be served in a less intensive setting? Yes 2. Co-Morbidities requiring supervision/potential complications: htn, dm with dpn, right TMA, renal insuffiiency 3. Due to bladder management, bowel management, safety, skin/wound care, disease management, medication administration, pain management and patient education, does the patient require 24 hr/day rehab nursing? Yes 4. Does the patient require coordinated care of a physician, rehab nurse, PT (1-2 hrs/day, 5 days/week) and OT (1-2 hrs/day, 5 days/week) to address physical and functional deficits in the context of the above medical diagnosis(es)? Yes Addressing deficits in the following areas: balance, endurance, locomotion, strength, transferring, bowel/bladder control, bathing, dressing, feeding, grooming, toileting and psychosocial support 5. Can the patient actively participate in an intensive therapy program of at least 3 hrs of therapy per day at least 5 days per week? Yes 6. The potential for patient to make measurable gains while on inpatient rehab is excellent 7. Anticipated functional outcomes upon discharge from inpatient rehab are modified independent  with PT, modified independent and supervision with OT, n/a with SLP. 8. Estimated rehab length of stay to reach the above  functional goals is: 13-20 days 9. Does the patient have adequate social supports and living environment to accommodate these discharge functional goals? Yes 10. Anticipated D/C setting: Home 11. Anticipated post D/C treatments: HH therapy and Outpatient therapy 12. Overall Rehab/Functional Prognosis: excellent  RECOMMENDATIONS: This patient's condition is appropriate for continued rehabilitative care in the following setting: CIR Patient has agreed to participate in recommended program. Yes Note that insurance prior authorization may be required for reimbursement for recommended care.  Comment: Rehab Admissions Coordinator to follow up.  Thanks,  Meredith Staggers, MD, Mellody Drown     02/18/2015

## 2015-02-18 NOTE — Progress Notes (Signed)
Inpatient Diabetes Program Recommendations  AACE/ADA: New Consensus Statement on Inpatient Glycemic Control (2013)  Target Ranges:  Prepandial:   less than 140 mg/dL      Peak postprandial:   less than 180 mg/dL (1-2 hours)      Critically ill patients:  140 - 180 mg/dL   Reason for Assessment:  Results for Tara Hopkins, Tara Hopkins (MRN [id number]) as of 02/18/2015 14:52  Ref. Range 02/17/2015 11:13 02/17/2015 16:15 02/17/2015 21:44 02/18/2015 06:44 02/18/2015 11:41  Glucose-Capillary Latest Range: 70-99 mg/dL 180 (H) 177 (H) 242 (H) 151 (H) 148 (H)  Results for Tara Hopkins, Tara Hopkins (MRN [id number]) as of 02/18/2015 14:52  Ref. Range 02/16/2015 03:00  Hemoglobin A1C Latest Range: 4.8-5.6 % 8.9 (H)   Diabetes history: Type 2 diabetes Outpatient Diabetes medications: Lantus 30 units q HS, Novolog 4 units tid with meals if CBG's greater than 130 mg/dL, Onglyza 5 mg daily Current orders for Inpatient glycemic control:  Lantus 30 units daily, Amaryl 4 mg with breakfast, and Tradjenta 5 mg daily  Please add Novolog moderate correction tid with meals while patient is in the hospital.  Thanks, Adah Perl, RN, BC-ADM Inpatient Diabetes Coordinator Pager [phone] (8a-5p)

## 2015-02-18 NOTE — Progress Notes (Signed)
PT Cancellation Note  Patient Details Name: Tara Hopkins MRN: [id number] DOB: 01/16/1951   Cancelled Treatment:    Reason Eval/Treat Not Completed: Patient at procedure or test/unavailable pt off floor at Vascular. Will follow up next available time to perform PT evaluation.   Candy Sledge A 02/18/2015, 11:13 AM Candy Sledge, PT, DPT [phone]

## 2015-02-18 NOTE — Progress Notes (Signed)
STROKE TEAM PROGRESS NOTE   HISTORY Tara Hopkins is an 64 y.o. female with history of HTN, DM, HLD presenting with acute onset of left sided weakness. Notes symptoms started at 2130 02/15/2015 when she developed left sided weakness LE > UE. Denies any sensory or visual changes. Notes some slurred speech. Upon EMS arrival noted BP of 220/130, CBG of 224. CT head imaging reviewed, no signs of acute stroke. NIHSS 5. Given labetalol 10mg  IV x 1 with improvement of BP to 167/71. IV tPA administered. She was admitted for further evaluation and treatment.   SUBJECTIVE No family is at bedside. Patient reports she was depressed, that was why she did cocaine, that she does not do it regularly. She is willing to quit cocaine. She still has significant    OBJECTIVE Temp:  [97.7 F (36.5 C)-98.4 F (36.9 C)] 98.4 F (36.9 C) (04/11 0847) Pulse Rate:  [70-86] 70 (04/11 0847) Cardiac Rhythm:  [-] Normal sinus rhythm (04/11 0420) Resp:  [15-20] 18 (04/11 0847) BP: (121-188)/(54-96) 188/74 mmHg (04/11 0847) SpO2:  [96 %-100 %] 97 % (04/11 0847)   Recent Labs Lab 02/17/15 0751 02/17/15 1113 02/17/15 1615 02/17/15 2144 02/18/15 0644  GLUCAP 129* 180* 177* 242* 151*    Recent Labs Lab 02/15/15 2256 02/15/15 2259 02/17/15 0403  NA 137 140 139  K 4.4 4.5 4.0  CL 106 103 107  CO2 23  --  25  GLUCOSE 247* 252* 163*  BUN 21 24* 19  CREATININE 1.42* 1.40* 1.45*  CALCIUM 9.4  --  8.8    Recent Labs Lab 02/15/15 2256 02/17/15 0403  AST 22 14  ALT 23 16  ALKPHOS 93 77  BILITOT 0.4 0.4  PROT 6.9 5.8*  ALBUMIN 3.8 3.0*    Recent Labs Lab 02/15/15 2256 02/15/15 2259 02/17/15 0403  WBC 9.8  --  6.8  NEUTROABS 5.3  --  3.7  HGB 12.5 13.9 10.7*  HCT 38.7 41.0 33.5*  MCV 89.4  --  87.9  PLT 312  --  262   No results for input(s): CKTOTAL, CKMB, CKMBINDEX, TROPONINI in the last 168 hours.  Recent Labs  02/15/15 2256  LABPROT 13.2  INR 0.99    Recent Labs  02/15/15 2355   COLORURINE YELLOW  LABSPEC 1.009  PHURINE 6.0  GLUCOSEU 100*  HGBUR SMALL*  BILIRUBINUR NEGATIVE  KETONESUR NEGATIVE  PROTEINUR 100*  UROBILINOGEN 0.2  NITRITE NEGATIVE  LEUKOCYTESUR TRACE*       Component Value Date/Time   CHOL 195 02/16/2015 0300   TRIG 341* 02/16/2015 0300   HDL 39* 02/16/2015 0300   CHOLHDL 5.0 02/16/2015 0300   VLDL 68* 02/16/2015 0300   LDLCALC 88 02/16/2015 0300   Lab Results  Component Value Date   HGBA1C 8.9* 02/16/2015      Component Value Date/Time   LABOPIA NONE DETECTED 02/15/2015 2355   COCAINSCRNUR POSITIVE* 02/15/2015 2355   LABBENZ NONE DETECTED 02/15/2015 2355   AMPHETMU NONE DETECTED 02/15/2015 2355   THCU NONE DETECTED 02/15/2015 2355   LABBARB NONE DETECTED 02/15/2015 2355     Recent Labs Lab 02/15/15 2257  ETH <5   I have personally reviewed the radiological images below and agree with the radiology interpretations.  Ct Head Wo Contrast 02/15/2015   1. Mild periventricular white matter changes bilaterally. 2. No acute intracranial abnormality. No evidence for acute infarct.     Ct Angio Neck W/cm &/or Wo/cm 02/18/2015    50% stenosis LEFT internal carotid artery  origin. No large vessel occlusion.     MRI HEAD    02/16/2015    Acute nonhemorrhagic infarct extends from the superior posterior aspect of the right lenticular nucleus into the posterior corona radiata.  Small acute nonhemorrhagic infarct mid posterior limb of the left internal capsule.  Remote right thalamic infarct.  Moderate small vessel disease type changes.  Mild atrophy.  Mild exophthalmos.  Mild spinal stenosis C3-4 level.    MRA HEAD   02/16/2015  Intracranial atherosclerotic type changes   Carotid Doppler  Right: 1-39% ICA stenosis. Left: 40-59% (high end of range) internal carotid artery stenosis. Bilateral: Vertebral artery flow is antegrade.   2D Echocardiogram   - Left ventricle: The cavity size was normal. Wall thickness wasincreased in a pattern  of severe LVH. Systolic function wasnormal. The estimated ejection fraction was in the range of 55%to 60%. Wall motion was normal; there were no regional wallmotion abnormalities. Doppler parameters are consistent withabnormal left ventricular relaxation (grade 1 diastolicdysfunction). - Atrial septum: No defect or patent foramen ovale was identified.  LE Dopplers there is no DVT or SVT noted in the bilateral lower extremities  PHYSICAL EXAM  Temp:  [98 F (36.7 C)-98.4 F (36.9 C)] 98.4 F (36.9 C) (04/11 2211) Pulse Rate:  [69-86] 73 (04/11 2211) Resp:  [16-20] 18 (04/11 2211) BP: (141-188)/(62-92) 159/62 mmHg (04/11 2211) SpO2:  [96 %-98 %] 96 % (04/11 2211)  General - Well nourished, well developed, in no apparent distress.  Ophthalmologic - Sharp disc margins OU.  Cardiovascular - Regular rate and rhythm.  Mental Status -  Level of arousal and orientation to time, place, and person were intact. Language including expression, naming, repetition, comprehension was assessed and found intact.  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  III, IV, VI - Extraocular movements showed disconjugated eyes. V - Facial sensation intact bilaterally. VII - Facial movement intact bilaterally. VIII - Hearing & vestibular intact bilaterally. X - Palate elevates symmetrically, mild dysarthria. XI - Chin turning & shoulder shrug intact bilaterally. XII - Tongue protrusion intact.  Motor Strength - The patient's strength was LUE 4+/5 and 3/5 left fingers, LLE 4/5 and pronator drift was present on the left.  Bulk was normal and fasciculations were absent.   Motor Tone - Muscle tone was assessed at the neck and appendages and was normal.  Reflexes - The patient's reflexes were 1+ in all extremities and she had no pathological reflexes.  Sensory - Light touch, temperature/pinprick were assessed and were symmetrical.    Coordination -  The patient had abnormal movements on the LUE for FTN but not out of proportion of weakness.  Tremor was absent.  Gait and Station - deferred due to weakness..   ASSESSMENT/PLAN Ms. Tara Hopkins is a 64 y.o. female with history of HTN, DM, HLD presenting with left sided weakness. She was administered IV t-PA.  Stroke:  right lenticular nucleus/posterior corona radiata infarct and punctate left internal capsule infarct secondary to small vessel disease source  Resultant  Left hemiparesis  MRI  See above  MRA  Intracranial atherosclerosis, no significant large vessel stenosis  CT angio neck left carotid 50% stenosis  Carotid Doppler left 40-59%, high end, internal carotid stenosis  2D Echo  No source of embolus  Lower extremity Dopplers negative for DVT  Lovenox 40 mg sq daily for VTE prophylaxis  Diet Carb Modified Fluid consistency:: Thin; Room service appropriate?:  Yes  no antithrombotic prior to admission, now on aspirin 325 mg orally every day  Patient counseled to be compliant with her antithrombotic medications  Ongoing aggressive stroke risk factor management  d/c IVF->NSL  Therapy recommendations:  Pending. Based on exam, anticipate need for CIR. Will place consult.  Disposition:  pending   Cocaine abuse  Current smoker user  Cocaine cessartion counseling provided  Pt is willing to quit  Hypertension  Home meds:   Lisinopril  Slightly elevated  Permissive hypertension (OK if < 220/120) but gradually normalize in 5-7 days  Lisinopril increased to 20 mg daily  Patient counseled to be compliant with her blood pressure medications  Hyperlipidemia  Home meds:  Zocor 20 resumed in hospital. She had taken Crestor in the past but has not been taking recently  LDL 88, goal < 70  Continue statin at discharge  Diabetes type II, uncontrolled  Home medications:  Lantus 30 units q HS, Novolog 4 units tid with meals if CBG's greater than 130 mg/dL,  Onglyza 5 mg daily  HgbA1c 8.9, goal < 7.0  NovoLog moderate correction with meals added per recommendations  Other Stroke Risk Factors  Cocaine use, patient advised to quit  Occasional ETOH use  Other Active Problems  Chronic kidney disease stage III  Other Pertinent History  R foot amputation. Neurontin dose decreased per pharmacy recommendation. Restarted Lamictal.  Hospital day # [place] for Pager information 02/18/2015 3:52 PM   I, the attending vascular neurologist, have personally obtained a history, examined the patient, evaluated laboratory data, individually viewed imaging studies and agree with radiology interpretations. Together with the NP/PA, we formulated the assessment and plan of care which reflects our mutual decision.  I have made any additions or clarifications directly to the above note and agree with the findings and plan as currently documented.   64 yo F with hx of DM, HTN, HLD, cocaine use admitted for acute right corona radiata stroke. MRI also showed left internal capsule punctate infarct. Due to significant stroke risk factors with LDL 88, A1C 8.9 and cocaine use, her stroke most likely synchronized small vessel events. Will continue ASA 325, as well as statin and aggressive PT/OT.   Rosalin Hawking, MD PhD Stroke Neurology 02/18/2015 11:21 PM       To contact Stroke Continuity provider, please refer to [email or web address] After hours, contact General Neurology

## 2015-02-18 NOTE — Evaluation (Signed)
Physical Therapy Evaluation Patient Details Name: Tara Hopkins MRN: [id number] DOB: Feb 27, 1951 Today's Date: 02/18/2015   History of Present Illness  Patient is a 64 y/o female with PMH of HTN, DM, HLD, depression and anxiety presenting with acute onset of left sided weakness. + cocaine use a few days prior to admission. Admiited code stroke with NIHSS of 5, s/p IV t-PA. MRI revealed acute infarct in R lenticular nucleus extending to corona radiata and left internal capsule.    Clinical Impression  Patient presents with left hemiparesis and poor trunk control/dynamic sitting balance impacting mobility. Pt with hx of transmetatarsal amputation of right foot. Pt lives alone but reports a good support system btw daughter, niece and g/f. Tolerated SPT x4 with Mod A for support. Highly motivated to participate in therapy. Would benefit from skilled PT and CIR to improve transfers, gait, balance and mobility so pt can maximize independence and ease burden of care prior to return home.    Follow Up Recommendations CIR;Supervision/Assistance - 24 hour    Equipment Recommendations  Other (comment) (TBD.)    Recommendations for Other Services Rehab consult     Precautions / Restrictions Precautions Precautions: Fall Restrictions Weight Bearing Restrictions: No      Mobility  Bed Mobility Overal bed mobility: Needs Assistance Bed Mobility: Supine to Sit     Supine to sit: Min assist;HOB elevated     General bed mobility comments: Min A to elevate trunk; use of rail for support. Increased time and difficulty. Cues to facilitate trunk to assist with upright.   Transfers Overall transfer level: Needs assistance Equipment used: None (handles on back of recliner) Transfers: Sit to/from [facility] Sit to Stand: Mod assist;Min assist Stand pivot transfers: Mod assist       General transfer comment: Mod A initially to stand progressing to Min A with cues for hand  placement and anterior translation. Stood from EOB x4, from [facility] x3. Pt locking LLE into knee extension for stability. SPT bed to/from BSC x4 towards right side Mod A for support.   Ambulation/Gait             General Gait Details: Gait deferred secondary to safety concerns. Tolerated pregait and mini marches in standing with UE support and assist with weightshifting.   Stairs            Wheelchair Mobility    Modified Rankin (Stroke Patients Only) Modified Rankin (Stroke Patients Only) Pre-Morbid Rankin Score: Slight disability Modified Rankin: Moderately severe disability     Balance Overall balance assessment: Needs assistance Sitting-balance support: Feet supported;Single extremity supported Sitting balance-Leahy Scale: Fair Sitting balance - Comments: Able to perform static sitting with UE support however decreased trunk control noted during dynamic sitting activities (AROM LEs) requiring Min A to remain upright.  Postural control: Posterior lean Standing balance support: During functional activity Standing balance-Leahy Scale: Poor Standing balance comment: Relient on UE and external support for dynamic standing balance. Able to perform static standing with BUE support.                             Pertinent Vitals/Pain Pain Assessment: No/denies pain    Home Living Family/patient expects to be discharged to:: Private residence Living Arrangements: Alone Available Help at Discharge: Friend(s);Family;Available PRN/intermittently Type of Home: Apartment Home Access: Level entry     Home Layout: One level Home Equipment: Wheelchair - manual;Bedside commode;Walker - 2 wheels;Cane - single point;Shower seat  Prior Function Level of Independence: Independent with assistive device(s)         Comments: Uses SPC on PRN basis; pt does not drive.      Hand Dominance   Dominant Hand: Right    Extremity/Trunk Assessment   Upper Extremity  Assessment: Defer to OT evaluation;LUE deficits/detail       LUE Deficits / Details: Lacking full AROM with slow movement noted throughout. Decreased strength throughout shoulder, elbow, wrist, hand. Weak grip compared to right.   Lower Extremity Assessment: LLE deficits/detail;Generalized weakness;RLE deficits/detail RLE Deficits / Details: Metatarsal amputation in February 2016. LLE Deficits / Details: Grossly ~2+/5 hip flexion, knee extension/flexion, DF/PF.     Communication   Communication: No difficulties  Cognition Arousal/Alertness: Awake/alert Behavior During Therapy: WFL for tasks assessed/performed Overall Cognitive Status: Within Functional Limits for tasks assessed (A&O x4. Aware she had a stroke.)                      General Comments      Exercises        Assessment/Plan    PT Assessment Patient needs continued PT services  PT Diagnosis Difficulty walking;Generalized weakness   PT Problem List Decreased strength;Decreased coordination;Decreased range of motion;Impaired tone;Decreased mobility;Decreased balance  PT Treatment Interventions Balance training;Gait training;DME instruction;Patient/family education;Functional mobility training;Therapeutic exercise;Therapeutic activities;Neuromuscular re-education   PT Goals (Current goals can be found in the Care Plan section) Acute Rehab PT Goals Patient Stated Goal: to return to independence PT Goal Formulation: With patient Time For Goal Achievement: 03/04/15 Potential to Achieve Goals: Good    Frequency Min 4X/week   Barriers to discharge Decreased caregiver support Pt lives alone but seems to have a good support system btw daughter, niece and gf.    Co-evaluation               End of Session Equipment Utilized During Treatment: Gait belt Activity Tolerance: Patient tolerated treatment well Patient left: in chair;with call bell/phone within reach;with chair alarm set Nurse Communication:  Mobility status         Time: 1312-1340 PT Time Calculation (min) (ACUTE ONLY): 28 min   Charges:   PT Evaluation $Initial PT Evaluation Tier I: 1 Procedure PT Treatments $Therapeutic Activity: 8-22 mins   PT G CodesCandy Sledge A Mar 16, 2015, 2:19 PM Candy Sledge, [place], DPT [phone]

## 2015-02-18 NOTE — Clinical Documentation Improvement (Signed)
  MD's, NP's, and PA's  CKD stage documented in notes,  please document the stage if appropriate for this admission.  Thank you  Possible Clinical Conditions?   CKD Stage I - GFR > OR = 90 CKD Stage II - GFR 60-80 CKD Stage III - GFR 30-59 CKD Stage IV - GFR 15-29 CKD Stage V - GFR < 15 Other condition Cannot Clinically determine     Risk Factors: DM 2, CVA, HTN   Diagnostics:  Creatinine, Ser 0.50 - 1.10 mg/dL 1.45 (H) 1.40 (H) 1.42 (H)  1.7 (H)R  1.3 (       GFR 44/ 45   Treatment: IV NS@ 50 ml  Thank You, Ree Kida ,RN Clinical Documentation Specialist:  [place] [employer]

## 2015-02-18 NOTE — Progress Notes (Signed)
VASCULAR LAB PRELIMINARY  PRELIMINARY  PRELIMINARY  PRELIMINARY  Bilateral lower extremity venous Dopplers completed.    Preliminary report:  There is no DVT or SVT noted in the bilateral lower extremities.   Starlet Gallentine, RVT 02/18/2015, 11:32 AM

## 2015-02-18 NOTE — Progress Notes (Signed)
OT Cancellation Note  Patient Details Name: Tara Hopkins MRN: [id number] DOB: [birth year]   Cancelled Treatment:    Reason Eval/Treat Not Completed: Medical issues which prohibited therapy (BP 188 lethargic) Pt currently with elevated BP over order set SP 180 restriction. OT to hold at this time and check back as appropriate. Rn aware. OT following closely.  Peri Maris  Pager: [phone]  02/18/2015, 9:02 AM

## 2015-02-18 NOTE — Progress Notes (Signed)
Rehab Admissions Coordinator Note:  Patient was screened by Retta Diones for appropriateness for an Inpatient Acute Rehab Consult.  At this time, an inpatient rehab consult has been ordered and is pending completion.  Gerlean Ren will follow up once consult has been completed and can be reached at [phone].  Retta Diones 02/18/2015, 2:48 PM

## 2015-02-19 DIAGNOSIS — F1410 Cocaine abuse, uncomplicated: Secondary | ICD-10-CM

## 2015-02-19 LAB — GLUCOSE, CAPILLARY
GLUCOSE-CAPILLARY: 174 mg/dL — AB (ref 70–99)
GLUCOSE-CAPILLARY: 219 mg/dL — AB (ref 70–99)
GLUCOSE-CAPILLARY: 170 mg/dL — AB (ref 70–99)
Glucose-Capillary: 143 mg/dL — ABNORMAL HIGH (ref 70–99)

## 2015-02-19 MED ORDER — WHITE PETROLATUM GEL
Status: AC
  Administered 2015-02-19: 0.2
  Filled 2015-02-19: qty 1

## 2015-02-19 MED ORDER — LISINOPRIL 20 MG PO TABS
20.0000 mg | ORAL_TABLET | Freq: Two times a day (BID) | ORAL | Status: DC
Start: 2015-02-19 — End: 2015-02-20
  Administered 2015-02-19 – 2015-02-20 (×2): 20 mg via ORAL
  Filled 2015-02-19: qty 1
  Filled 2015-02-19: qty 2

## 2015-02-19 NOTE — Progress Notes (Signed)
STROKE TEAM PROGRESS NOTE   HISTORY Tara Hopkins is an 64 y.o. female with history of HTN, DM, HLD presenting with acute onset of left sided weakness. Notes symptoms started at 2130 02/15/2015 when she developed left sided weakness LE > UE. Denies any sensory or visual changes. Notes some slurred speech. Upon EMS arrival noted BP of 220/130, CBG of 224. CT head imaging reviewed, no signs of acute stroke. NIHSS 5. Given labetalol 10mg  IV x 1 with improvement of BP to 167/71. IV tPA administered. She was admitted for further evaluation and treatment.   SUBJECTIVE Rehab admission's coordinator at bedside. She is explaining to pt for possible CIR placement. No acute issues overnight.   OBJECTIVE Temp:  [98 F (36.7 C)-98.4 F (36.9 C)] 98.2 F (36.8 C) (04/12 1020) Pulse Rate:  [6-86] 66 (04/12 1020) Cardiac Rhythm:  [-] Normal sinus rhythm (04/12 0833) Resp:  [16-20] 16 (04/12 1020) BP: (141-176)/(62-90) 176/81 mmHg (04/12 1020) SpO2:  [96 %-98 %] 96 % (04/12 1020)   Recent Labs Lab 02/18/15 0644 02/18/15 1141 02/18/15 1646 02/18/15 2226 02/19/15 0650  GLUCAP 151* 148* 144* 196* 143*    Recent Labs Lab 02/15/15 2256 02/15/15 2259 02/17/15 0403  NA 137 140 139  K 4.4 4.5 4.0  CL 106 103 107  CO2 23  --  25  GLUCOSE 247* 252* 163*  BUN 21 24* 19  CREATININE 1.42* 1.40* 1.45*  CALCIUM 9.4  --  8.8    Recent Labs Lab 02/15/15 2256 02/17/15 0403  AST 22 14  ALT 23 16  ALKPHOS 93 77  BILITOT 0.4 0.4  PROT 6.9 5.8*  ALBUMIN 3.8 3.0*    Recent Labs Lab 02/15/15 2256 02/15/15 2259 02/17/15 0403  WBC 9.8  --  6.8  NEUTROABS 5.3  --  3.7  HGB 12.5 13.9 10.7*  HCT 38.7 41.0 33.5*  MCV 89.4  --  87.9  PLT 312  --  262   No results for input(s): CKTOTAL, CKMB, CKMBINDEX, TROPONINI in the last 168 hours. No results for input(s): LABPROT, INR in the last 72 hours. No results for input(s): COLORURINE, LABSPEC, [place], GLUCOSEU, HGBUR, BILIRUBINUR, KETONESUR,  PROTEINUR, UROBILINOGEN, NITRITE, LEUKOCYTESUR in the last 72 hours.  Invalid input(s): APPERANCEUR     Component Value Date/Time   CHOL 195 02/16/2015 0300   TRIG 341* 02/16/2015 0300   HDL 39* 02/16/2015 0300   CHOLHDL 5.0 02/16/2015 0300   VLDL 68* 02/16/2015 0300   LDLCALC 88 02/16/2015 0300   Lab Results  Component Value Date   HGBA1C 8.9* 02/16/2015      Component Value Date/Time   LABOPIA NONE DETECTED 02/15/2015 2355   COCAINSCRNUR POSITIVE* 02/15/2015 2355   LABBENZ NONE DETECTED 02/15/2015 2355   AMPHETMU NONE DETECTED 02/15/2015 2355   THCU NONE DETECTED 02/15/2015 2355   LABBARB NONE DETECTED 02/15/2015 2355     Recent Labs Lab 02/15/15 2257  ETH <5    Ct Head Wo Contrast 02/15/2015   1. Mild periventricular white matter changes bilaterally. 2. No acute intracranial abnormality. No evidence for acute infarct.     Ct Angio Neck W/cm &/or Wo/cm 02/18/2015    50% stenosis LEFT internal carotid artery origin. No large vessel occlusion.     MRI HEAD    02/16/2015    Acute nonhemorrhagic infarct extends from the superior posterior aspect of the right lenticular nucleus into the posterior corona radiata.  Small acute nonhemorrhagic infarct mid posterior limb of the left internal capsule.  Remote right thalamic infarct.  Moderate small vessel disease type changes.  Mild atrophy.  Mild exophthalmos.  Mild spinal stenosis C3-4 level.    MRA HEAD   02/16/2015  Intracranial atherosclerotic type changes   Carotid Doppler  Right: 1-39% ICA stenosis. Left: 40-59% (high end of range) internal carotid artery stenosis. Bilateral: Vertebral artery flow is antegrade.   2D Echocardiogram   - Left ventricle: The cavity size was normal. Wall thickness wasincreased in a pattern of severe LVH. Systolic function wasnormal. The estimated ejection fraction was in the range of 55%to 60%. Wall motion was normal; there were no regional wallmotion abnormalities. Doppler parameters are  consistent withabnormal left ventricular relaxation (grade 1 diastolicdysfunction). - Atrial septum: No defect or patent foramen ovale was identified.  LE Dopplers there is no DVT or SVT noted in the bilateral lower extremities   PHYSICAL EXAM  Temp:  [98 F (36.7 C)-98.4 F (36.9 C)] 98.2 F (36.8 C) (04/12 1020) Pulse Rate:  [6-86] 66 (04/12 1020) Resp:  [16-20] 16 (04/12 1020) BP: (141-176)/(62-90) 176/81 mmHg (04/12 1020) SpO2:  [96 %-98 %] 96 % (04/12 1020)  General - Well nourished, well developed, in no apparent distress.  Ophthalmologic - Sharp disc margins OU.  Cardiovascular - Regular rate and rhythm.  Musculoskeletal - right foot partial amputation.  Mental Status -  Level of arousal and orientation to time, place, and person were intact. Language including expression, naming, repetition, comprehension was assessed and found intact.  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  III, IV, VI - Extraocular movements showed disconjugated eyes. V - Facial sensation intact bilaterally. VII - Facial movement intact bilaterally. VIII - Hearing & vestibular intact bilaterally. X - Palate elevates symmetrically, mild dysarthria. XI - Chin turning & shoulder shrug intact bilaterally. XII - Tongue protrusion intact.  Motor Strength - The patient's strength was LUE 4+/5 and 3/5 left fingers, LLE 4/5 and pronator drift was present on the left.  Bulk was normal and fasciculations were absent.   Motor Tone - Muscle tone was assessed at the neck and appendages and was normal.  Reflexes - The patient's reflexes were 1+ in all extremities and she had no pathological reflexes.  Sensory - Light touch, temperature/pinprick were assessed and were symmetrical.    Coordination - The patient had abnormal movements on the LUE for FTN but not out of proportion of weakness.  Tremor was absent.  Gait and Station - deferred due to  weakness..   ASSESSMENT/PLAN Ms. Tara Hopkins is a 64 y.o. female with history of HTN, DM, HLD presenting with left sided weakness. She was administered IV t-PA.  Stroke:  right lenticular nucleus/posterior corona radiata infarct and punctate left internal capsule infarct secondary to small vessel disease source  Resultant  Left hemiparesis  MRI  See above  MRA  Intracranial atherosclerosis, no significant large vessel stenosis  CT angio neck left carotid 50% stenosis  Carotid Doppler left 40-59%, high end, internal carotid stenosis  2D Echo  No source of embolus  Lower extremity Dopplers negative for DVT  Lovenox 40 mg sq daily for VTE prophylaxis Diet Carb Modified Fluid consistency:: Thin; Room service appropriate?: Yes  no antithrombotic prior to admission, now on aspirin 325 mg orally every day.   Patient counseled to be compliant with her antithrombotic medications  Ongoing aggressive stroke risk factor management  Therapy recommendations:  CIR   Disposition:  Pending. Medically  ready for discharge when bed available. No bed available today. They are submitting to insurance company for approval.  Cocaine abuse  Current smoker user  Cocaine cessartion counseling provided  Pt is willing to quit  Hypertension  Home meds:   Lisinopril  BP still elevated  Lisinopril increased to 20 mg bid  Patient counseled to be compliant with her blood pressure medications  Hyperlipidemia  Home meds:  Zocor 20 resumed in hospital. She had taken Crestor in the past but has not been taking recently  LDL 88, goal < 70  Continue statin at discharge  Diabetes type II, uncontrolled  Home medications:  Lantus 30 units q HS, Novolog 4 units tid with meals if CBG's greater than 130 mg/dL, Onglyza 5 mg daily  HgbA1c 8.9, goal < 7.0  NovoLog moderate correction with meals added per recommendations  Other Stroke Risk Factors  Occasional ETOH use  Other Active  Problems  Chronic kidney disease stage III  Other Pertinent History  R foot amputation. Neurontin dose decreased per pharmacy recommendation. Restarted Lamictal.  Hospital day # [place] for Pager information 02/19/2015 10:32 AM   I, the attending vascular neurologist, have personally obtained a history, examined the patient, evaluated laboratory data, individually viewed imaging studies and agree with radiology interpretations. Together with the NP/PA, we formulated the assessment and plan of care which reflects our mutual decision.  I have made any additions or clarifications directly to the above note and agree with the findings and plan as currently documented.   64 yo F with hx of DM, HTN, HLD, cocaine use admitted for acute right corona radiata stroke. MRI also showed left internal capsule punctate infarct. Due to significant stroke risk factors with uncontrolled HTN, A1C 8.9 and cocaine use, her stroke most likely synchronized small vessel events. Will continue ASA 325, as well as statin and aggressive PT/OT. BP still high, increased lisinopril dose. Glucose stable. Await CIR placement.  Rosalin Hawking, MD PhD Stroke Neurology 02/19/2015 2:51 PM         To contact Stroke Continuity provider, please refer to [email or web address] After hours, contact General Neurology

## 2015-02-19 NOTE — Progress Notes (Signed)
Inpatient Rehabilitation  I met with Tara Hopkins at the bedside to discuss her post acute rehab options.  I explained the IP Rehab program and answered her questions.  She requests that I pursue insurance authorization and I have initiated this.  In the meantime I am working to solidify family support post rehab. Admission to IP rehab will depend on insurance approval and bed availability.   I have updated Dysheeka Bibbs, SW and Olga Coaster, CM. Dr. [name] and Burnetta Sabin aware. Please call if questions.  [facility] Admissions Coordinator Cell [phone] Office [phone]

## 2015-02-19 NOTE — Progress Notes (Signed)
Physical Therapy Treatment Patient Details Name: Tara Hopkins MRN: [id number] DOB: 1951/09/03 Today's Date: 02/19/2015    History of Present Illness Patient is a 64 y/o female with PMH of HTN, DM, HLD, depression and anxiety presenting with acute onset of left sided weakness. + cocaine use a few days prior to admission. Admiited code stroke with NIHSS of 5, s/p IV t-PA. MRI revealed acute infarct in R lenticular nucleus extending to corona radiata and left internal capsule.    PT Comments    Patient progressing slowly with mobility. Improved ambulation distance with Max A of 2 for trunk control and sequencing of LLE. Pt demonstrates knee hyperextension thrust LLE vs knee buckling and exhibits impaired strength in quadriceps in mid range. Highly motivated. Continues to be appropriate for CIR. Will continue to follow and progress as tolerated.   Follow Up Recommendations  CIR;Supervision/Assistance - 24 hour     Equipment Recommendations  Other (comment)    Recommendations for Other Services       Precautions / Restrictions Precautions Precautions: Fall Precaution Comments: R LE amputation shoe (daughter to bring to hospital) Restrictions Weight Bearing Restrictions: No    Mobility  Bed Mobility               General bed mobility comments: Sitting in chair upon PT arrival.   Transfers Overall transfer level: Needs assistance Equipment used: None;Rolling walker (2 wheeled) Transfers: Sit to/from Stand Sit to Stand: Mod assist Stand pivot transfers: +2 physical assistance;Mod assist       General transfer comment: Cues for anterior translation and hand placement emphasizing forward lean to stand. Stood from chair x3  Ambulation/Gait Ambulation/Gait assistance: Mod assist;+2 physical assistance;Max assist Ambulation Distance (Feet): 50 Feet (+ 30') Assistive device: Rolling walker (2 wheeled);None (Rail in hallway) Gait Pattern/deviations: Step-through  pattern;Decreased stride length;Decreased weight shift to left;Narrow base of support;Drifts right/left;Staggering left;Staggering right   Gait velocity interpretation: Below normal speed for age/gender General Gait Details: Pt required Max A of 2 for trunk control, advancement LLE and stabilization LLE during stance phase. Knee hyperextension thrust LLE during stance phase. Assist to navigate RW and proximity as pt veered right. Ambulated with RW vs rail in hallway. gait mechanics improved using rail in hallway,   Stairs            Wheelchair Mobility    Modified Rankin (Stroke Patients Only) Modified Rankin (Stroke Patients Only) Pre-Morbid Rankin Score: Slight disability Modified Rankin: Moderately severe disability     Balance Overall balance assessment: Needs assistance Sitting-balance support: Feet supported;No upper extremity supported Sitting balance-Leahy Scale: Fair     Standing balance support: During functional activity Standing balance-Leahy Scale: Poor                      Cognition Arousal/Alertness: Awake/alert Behavior During Therapy: WFL for tasks assessed/performed Overall Cognitive Status: Within Functional Limits for tasks assessed                      Exercises      General Comments        Pertinent Vitals/Pain Pain Assessment: No/denies pain    Home Living Family/patient expects to be discharged to:: Private residence Living Arrangements: Other (Comment) (lives with ex husband - but needs to be MOD I to return home) Available Help at Discharge: Friend(s);Family;Available PRN/intermittently Type of Home: Apartment Home Access: Level entry   Home Layout: One level Home Equipment: Wheelchair - manual;Bedside commode;Walker - 2  wheels;Cane - single point;Shower seat Additional Comments: pt has family in the area and a sister in [place]. Pt lives with ex husband but must be MOD I level to return home. Pt has x2 god daughters  that can help with d/c     Prior Function Level of Independence: Independent with assistive device(s)      Comments: Uses SPC on PRN basis; pt does not drive.    PT Goals (current goals can now be found in the care plan section) Acute Rehab PT Goals Patient Stated Goal: to return to independence Progress towards PT goals: Progressing toward goals    Frequency  Min 4X/week    PT Plan Current plan remains appropriate    Co-evaluation             End of Session Equipment Utilized During Treatment: Gait belt Activity Tolerance: Patient tolerated treatment well Patient left: in chair;with call bell/phone within reach;with chair alarm set;with nursing/sitter in room     Time: 1140-1201 PT Time Calculation (min) (ACUTE ONLY): 21 min  Charges:  $Gait Training: 8-22 mins                    G CodesCandy Sledge A 15-Mar-2015, 1:02 PM Candy Sledge, [place], DPT [phone]

## 2015-02-19 NOTE — Evaluation (Signed)
Occupational Therapy Evaluation Patient Details Name: Tara Hopkins MRN: [id number] DOB: [birth year] Today's Date: 02/19/2015    History of Present Illness Patient is a 64 y/o female with PMH of HTN, DM, HLD, depression and anxiety presenting with acute onset of left sided weakness. + cocaine use a few days prior to admission. Admiited code stroke with NIHSS of 5, s/p IV t-PA. MRI revealed acute infarct in R lenticular nucleus extending to corona radiata and left internal capsule.   Clinical Impression   PT admitted with R lenticular nucleus extending to corona radiate and L internal capsule infarcts.. Pt currently with functional limitiations due to the deficits listed below (see OT problem list). Pt extremely motivated to regain independence and liable during session expressing this desire. Pt demonstrates AROM LUE and L LE. Pt will benefit from skilled OT to increase their independence and safety with adls and balance to allow discharge CIR. Ot to follow acutely for balance, basic transfer, L UE HEP and adl retraining.     Follow Up Recommendations  CIR    Equipment Recommendations  Other (comment) (defer cir)    Recommendations for Other Services Rehab consult     Precautions / Restrictions Precautions Precautions: Fall Precaution Comments: R LE amputation shoe (daughter to bring to hospital) Restrictions Weight Bearing Restrictions: No      Mobility Bed Mobility               General bed mobility comments: in chair on arrival  Transfers Overall transfer level: Needs assistance Equipment used: 2 person hand held assist Transfers: Sit to/from Stand Sit to Stand: Max assist Stand pivot transfers: +2 physical assistance;Mod assist       General transfer comment: pt with posterior lean and weight bearing on heel of bil LE. Pt needs cues to shift weight anterior    Balance   Sitting-balance support: Single extremity supported;Feet supported Sitting  balance-Leahy Scale: Fair     Standing balance support: Single extremity supported;During functional activity Standing balance-Leahy Scale: Poor                              ADL Overall ADL's : Needs assistance/impaired Eating/Feeding: Set up;Sitting Eating/Feeding Details (indicate cue type and reason): pt very motivated and CNA reports she became very frustrated but insisted on doing it alone. I didnt have to setup the tray but she managed the rest with alot of time Grooming: Wash/dry face;Supervision/safety;Sitting Grooming Details (indicate cue type and reason): R hand dominant Upper Body Bathing: Maximal assistance;Sitting   Lower Body Bathing: +2 for physical assistance;Maximal assistance;Sit to/from stand           Toilet Transfer: Maximal assistance Toilet Transfer Details (indicate cue type and reason): pt needs cues for hand placement and posterior lean with static standing. Pt does not have shoe for R LE at this time.           General ADL Comments: Pt with elevated BP this session. Pt reports being upset during the day and demonstrates liable due to situation x2 during session. Pt states "aint it aweful to be this old and have no one to take care of you" Pt demonstrated static standing , shifting weight and weight bearing on LLE stepping with R LE. Pt attempting to alternate opposite side. Pt demonstates ability to weight bear R LE and lift L LE hip flexion     Vision Vision Assessment?: No apparent visual deficits (reading menu no  deficits)   Perception     Praxis      Pertinent Vitals/Pain Pain Assessment: No/denies pain     Hand Dominance Right   Extremity/Trunk Assessment Upper Extremity Assessment Upper Extremity Assessment: LUE deficits/detail LUE Deficits / Details: decr scapula retraction, elevation adduction but AROM noted. Bicep 3+ out 5 tricep 3 out 5 wrist 3 out 5 brunnstrom III hand, patient able to flex digit but unable to push with  pressure to dial phone LUE Sensation: decreased light touch;decreased proprioception LUE Coordination: decreased fine motor;decreased gross motor   Lower Extremity Assessment Lower Extremity Assessment: Defer to PT evaluation LLE Deficits / Details: hyperextension of knee with static standing. required blocking with static standing   Cervical / Trunk Assessment Cervical / Trunk Assessment: Normal   Communication Communication Communication: No difficulties   Cognition Arousal/Alertness: Awake/alert Behavior During Therapy: WFL for tasks assessed/performed Overall Cognitive Status: Within Functional Limits for tasks assessed                     General Comments       Exercises       Shoulder Instructions      Home Living Family/patient expects to be discharged to:: Private residence Living Arrangements: Other (Comment) (lives with ex husband - but needs to be MOD I to return home) Available Help at Discharge: Friend(s);Family;Available PRN/intermittently Type of Home: Apartment Home Access: Level entry     Home Layout: One level     Bathroom Shower/Tub: Teacher, early years/pre: Standard     Home Equipment: Wheelchair - manual;Bedside commode;Walker - 2 wheels;Cane - single point;Shower seat   Additional Comments: pt has family in the area and a sister in [place]. Pt lives with ex husband but must be MOD I level to return home. Pt has x2 god daughters that can help with d/c       Prior Functioning/Environment Level of Independence: Independent with assistive device(s)        Comments: Uses SPC on PRN basis; pt does not drive.     OT Diagnosis: Generalized weakness;Hemiplegia non-dominant side;Ataxia   OT Problem List: Decreased strength;Decreased activity tolerance;Decreased range of motion;Impaired balance (sitting and/or standing);Decreased coordination;Decreased safety awareness;Decreased knowledge of use of DME or AE;Decreased knowledge of  precautions;Impaired UE functional use   OT Treatment/Interventions: Self-care/ADL training;Therapeutic exercise;Neuromuscular education;DME and/or AE instruction;Therapeutic activities;Patient/family education;Balance training    OT Goals(Current goals can be found in the care plan section) Acute Rehab OT Goals Patient Stated Goal: to return to independence OT Goal Formulation: With patient Time For Goal Achievement: 03/05/15 Potential to Achieve Goals: Good  OT Frequency: Min 3X/week   Barriers to D/C:            Co-evaluation              End of Session Equipment Utilized During Treatment: Gait belt Nurse Communication: Mobility status;Precautions  Activity Tolerance: Patient tolerated treatment well Patient left: in chair;with call bell/phone within reach   Time: 1012-1036 OT Time Calculation (min): 24 min Charges:  OT General Charges $OT Visit: 1 Procedure OT Evaluation $Initial OT Evaluation Tier I: 1 Procedure OT Treatments $Self Care/Home Management : 8-22 mins G-Codes:    Parke Poisson B 2015/03/13, 12:12 PM  Pager: [phone]

## 2015-02-20 ENCOUNTER — Inpatient Hospital Stay (HOSPITAL_COMMUNITY)
Admission: RE | Admit: 2015-02-20 | Discharge: 2015-03-08 | DRG: 057 | Disposition: A | Discharge status: Home / Self Care | Payer: BC Managed Care – PPO | Source: Intra-hospital | Attending: Physical Medicine & Rehabilitation | Admitting: Physical Medicine & Rehabilitation

## 2015-02-20 ENCOUNTER — Other Ambulatory Visit: Payer: Self-pay | Admitting: Physician Assistant

## 2015-02-20 DIAGNOSIS — I638 Other cerebral infarction: Secondary | ICD-10-CM | POA: Diagnosis not present

## 2015-02-20 DIAGNOSIS — Z79899 Other long term (current) drug therapy: Secondary | ICD-10-CM

## 2015-02-20 DIAGNOSIS — N183 Chronic kidney disease, stage 3 (moderate): Secondary | ICD-10-CM | POA: Diagnosis present

## 2015-02-20 DIAGNOSIS — I129 Hypertensive chronic kidney disease with stage 1 through stage 4 chronic kidney disease, or unspecified chronic kidney disease: Secondary | ICD-10-CM | POA: Diagnosis present

## 2015-02-20 DIAGNOSIS — Z794 Long term (current) use of insulin: Secondary | ICD-10-CM | POA: Diagnosis not present

## 2015-02-20 DIAGNOSIS — I69354 Hemiplegia and hemiparesis following cerebral infarction affecting left non-dominant side: Secondary | ICD-10-CM | POA: Diagnosis present

## 2015-02-20 DIAGNOSIS — Z89431 Acquired absence of right foot: Secondary | ICD-10-CM

## 2015-02-20 DIAGNOSIS — G629 Polyneuropathy, unspecified: Secondary | ICD-10-CM

## 2015-02-20 DIAGNOSIS — F17210 Nicotine dependence, cigarettes, uncomplicated: Secondary | ICD-10-CM | POA: Diagnosis present

## 2015-02-20 DIAGNOSIS — E1142 Type 2 diabetes mellitus with diabetic polyneuropathy: Secondary | ICD-10-CM

## 2015-02-20 DIAGNOSIS — R5081 Fever presenting with conditions classified elsewhere: Secondary | ICD-10-CM | POA: Diagnosis not present

## 2015-02-20 DIAGNOSIS — G8190 Hemiplegia, unspecified affecting unspecified side: Secondary | ICD-10-CM | POA: Diagnosis not present

## 2015-02-20 DIAGNOSIS — I739 Peripheral vascular disease, unspecified: Secondary | ICD-10-CM | POA: Diagnosis present

## 2015-02-20 DIAGNOSIS — F1410 Cocaine abuse, uncomplicated: Secondary | ICD-10-CM | POA: Insufficient documentation

## 2015-02-20 DIAGNOSIS — R112 Nausea with vomiting, unspecified: Secondary | ICD-10-CM | POA: Diagnosis present

## 2015-02-20 DIAGNOSIS — R197 Diarrhea, unspecified: Secondary | ICD-10-CM | POA: Diagnosis not present

## 2015-02-20 DIAGNOSIS — R509 Fever, unspecified: Secondary | ICD-10-CM

## 2015-02-20 DIAGNOSIS — N170 Acute kidney failure with tubular necrosis: Secondary | ICD-10-CM | POA: Diagnosis not present

## 2015-02-20 DIAGNOSIS — I639 Cerebral infarction, unspecified: Secondary | ICD-10-CM

## 2015-02-20 DIAGNOSIS — F319 Bipolar disorder, unspecified: Secondary | ICD-10-CM | POA: Diagnosis present

## 2015-02-20 DIAGNOSIS — R531 Weakness: Secondary | ICD-10-CM | POA: Diagnosis present

## 2015-02-20 DIAGNOSIS — N179 Acute kidney failure, unspecified: Secondary | ICD-10-CM | POA: Diagnosis not present

## 2015-02-20 DIAGNOSIS — I6359 Cerebral infarction due to unspecified occlusion or stenosis of other cerebral artery: Secondary | ICD-10-CM | POA: Diagnosis not present

## 2015-02-20 DIAGNOSIS — E780 Pure hypercholesterolemia: Secondary | ICD-10-CM | POA: Diagnosis present

## 2015-02-20 DIAGNOSIS — I63012 Cerebral infarction due to thrombosis of left vertebral artery: Secondary | ICD-10-CM | POA: Diagnosis not present

## 2015-02-20 DIAGNOSIS — I6330 Cerebral infarction due to thrombosis of unspecified cerebral artery: Secondary | ICD-10-CM

## 2015-02-20 DIAGNOSIS — E785 Hyperlipidemia, unspecified: Secondary | ICD-10-CM | POA: Diagnosis present

## 2015-02-20 DIAGNOSIS — F3130 Bipolar disorder, current episode depressed, mild or moderate severity, unspecified: Secondary | ICD-10-CM | POA: Diagnosis not present

## 2015-02-20 DIAGNOSIS — K529 Noninfective gastroenteritis and colitis, unspecified: Secondary | ICD-10-CM | POA: Diagnosis not present

## 2015-02-20 DIAGNOSIS — F310 Bipolar disorder, current episode hypomanic: Secondary | ICD-10-CM | POA: Diagnosis not present

## 2015-02-20 DIAGNOSIS — G8194 Hemiplegia, unspecified affecting left nondominant side: Secondary | ICD-10-CM

## 2015-02-20 LAB — GLUCOSE, CAPILLARY
GLUCOSE-CAPILLARY: 179 mg/dL — AB (ref 70–99)
GLUCOSE-CAPILLARY: 129 mg/dL — AB (ref 70–99)
Glucose-Capillary: 117 mg/dL — ABNORMAL HIGH (ref 70–99)
Glucose-Capillary: 150 mg/dL — ABNORMAL HIGH (ref 70–99)

## 2015-02-20 MED ORDER — AMLODIPINE BESYLATE 10 MG PO TABS
10.0000 mg | ORAL_TABLET | Freq: Every day | ORAL | Status: DC
Start: 2015-02-20 — End: 2015-02-20
  Administered 2015-02-20: 10 mg via ORAL
  Filled 2015-02-20: qty 1

## 2015-02-20 MED ORDER — LINAGLIPTIN 5 MG PO TABS
5.0000 mg | ORAL_TABLET | Freq: Every day | ORAL | Status: DC
  Administered 2015-02-21 – 2015-03-01 (×9): 5 mg via ORAL
  Filled 2015-02-20 (×10): qty 1

## 2015-02-20 MED ORDER — DIPHENHYDRAMINE HCL 12.5 MG/5ML PO ELIX: 12.5000 mg | ORAL_SOLUTION | Freq: Four times a day (QID) | ORAL | Status: DC | PRN

## 2015-02-20 MED ORDER — ACETAMINOPHEN 325 MG PO TABS
325.0000 mg | ORAL_TABLET | ORAL | Status: DC | PRN
Start: 2015-02-20 — End: 2015-03-08
  Administered 2015-02-20 – 2015-02-21 (×3): 650 mg via ORAL
  Administered 2015-02-21: 325 mg via ORAL
  Administered 2015-02-21 – 2015-03-07 (×17): 650 mg via ORAL
  Filled 2015-02-20 (×23): qty 2

## 2015-02-20 MED ORDER — LATANOPROST 0.005 % OP SOLN
1.0000 [drp] | Freq: Every day | OPHTHALMIC | Status: DC
  Administered 2015-02-20 – 2015-03-07 (×16): 1 [drp] via OPHTHALMIC
  Filled 2015-02-20: qty 2.5

## 2015-02-20 MED ORDER — AMLODIPINE BESYLATE 5 MG PO TABS: 5.0000 mg | ORAL_TABLET | Freq: Every day | ORAL | Status: DC

## 2015-02-20 MED ORDER — LISINOPRIL 20 MG PO TABS
20.0000 mg | ORAL_TABLET | Freq: Two times a day (BID) | ORAL | Status: DC
  Administered 2015-02-20 – 2015-02-28 (×15): 20 mg via ORAL
  Filled 2015-02-20 (×20): qty 1

## 2015-02-20 MED ORDER — GLIMEPIRIDE 4 MG PO TABS
4.0000 mg | ORAL_TABLET | Freq: Every day | ORAL | Status: DC
  Administered 2015-02-21 – 2015-02-28 (×8): 4 mg via ORAL
  Filled 2015-02-20 (×10): qty 1

## 2015-02-20 MED ORDER — PROCHLORPERAZINE EDISYLATE 5 MG/ML IJ SOLN
5.0000 mg | Freq: Four times a day (QID) | INTRAMUSCULAR | Status: DC | PRN
  Administered 2015-03-01: 10 mg via INTRAMUSCULAR
  Filled 2015-02-20: qty 2

## 2015-02-20 MED ORDER — FLEET ENEMA 7-19 GM/118ML RE ENEM: 1.0000 | ENEMA | Freq: Once | RECTAL | Status: AC | PRN

## 2015-02-20 MED ORDER — LAMOTRIGINE 25 MG PO TABS
50.0000 mg | ORAL_TABLET | Freq: Two times a day (BID) | ORAL | Status: DC
  Administered 2015-02-20 – 2015-03-08 (×32): 50 mg via ORAL
  Filled 2015-02-20 (×34): qty 2

## 2015-02-20 MED ORDER — INSULIN GLARGINE 100 UNIT/ML ~~LOC~~ SOLN
30.0000 [IU] | Freq: Every day | SUBCUTANEOUS | Status: DC
  Administered 2015-02-20 – 2015-02-21 (×2): 30 [IU] via SUBCUTANEOUS
  Filled 2015-02-20 (×3): qty 0.3

## 2015-02-20 MED ORDER — PROCHLORPERAZINE MALEATE 5 MG PO TABS
5.0000 mg | ORAL_TABLET | Freq: Four times a day (QID) | ORAL | Status: DC | PRN
  Administered 2015-02-26 – 2015-02-28 (×2): 10 mg via ORAL
  Filled 2015-02-20 (×3): qty 2

## 2015-02-20 MED ORDER — ALUM & MAG HYDROXIDE-SIMETH 200-200-20 MG/5ML PO SUSP: 30.0000 mL | ORAL | Status: DC | PRN

## 2015-02-20 MED ORDER — GUAIFENESIN-DM 100-10 MG/5ML PO SYRP: 5.0000 mL | ORAL_SOLUTION | Freq: Four times a day (QID) | ORAL | Status: DC | PRN

## 2015-02-20 MED ORDER — ENOXAPARIN SODIUM 40 MG/0.4ML ~~LOC~~ SOLN
40.0000 mg | SUBCUTANEOUS | Status: DC
  Administered 2015-02-20 – 2015-02-28 (×9): 40 mg via SUBCUTANEOUS
  Filled 2015-02-20 (×10): qty 0.4

## 2015-02-20 MED ORDER — GABAPENTIN 300 MG PO CAPS
600.0000 mg | ORAL_CAPSULE | Freq: Two times a day (BID) | ORAL | Status: DC
  Administered 2015-02-20 – 2015-03-08 (×32): 600 mg via ORAL
  Filled 2015-02-20 (×34): qty 2

## 2015-02-20 MED ORDER — TRAZODONE HCL 50 MG PO TABS
25.0000 mg | ORAL_TABLET | Freq: Every evening | ORAL | Status: DC | PRN
Start: 2015-02-20 — End: 2015-03-08
  Administered 2015-02-23 – 2015-03-07 (×11): 50 mg via ORAL
  Filled 2015-02-20 (×12): qty 1

## 2015-02-20 MED ORDER — ASPIRIN EC 325 MG PO TBEC
325.0000 mg | DELAYED_RELEASE_TABLET | Freq: Every day | ORAL | Status: DC
  Administered 2015-02-21 – 2015-03-08 (×16): 325 mg via ORAL
  Filled 2015-02-20 (×17): qty 1

## 2015-02-20 MED ORDER — PROCHLORPERAZINE 25 MG RE SUPP: 12.5000 mg | Freq: Four times a day (QID) | RECTAL | Status: DC | PRN

## 2015-02-20 MED ORDER — SENNOSIDES-DOCUSATE SODIUM 8.6-50 MG PO TABS
1.0000 | ORAL_TABLET | Freq: Every evening | ORAL | Status: DC | PRN
  Filled 2015-02-20: qty 1

## 2015-02-20 MED ORDER — AMLODIPINE BESYLATE 10 MG PO TABS
10.0000 mg | ORAL_TABLET | Freq: Every day | ORAL | Status: DC
  Administered 2015-02-21 – 2015-02-28 (×7): 10 mg via ORAL
  Filled 2015-02-20 (×10): qty 1

## 2015-02-20 MED ORDER — BISACODYL 10 MG RE SUPP: 10.0000 mg | Freq: Every day | RECTAL | Status: DC | PRN

## 2015-02-20 MED ORDER — INSULIN ASPART 100 UNIT/ML ~~LOC~~ SOLN
0.0000 [IU] | Freq: Three times a day (TID) | SUBCUTANEOUS | Status: DC
  Administered 2015-02-21: 4 [IU] via SUBCUTANEOUS
  Administered 2015-02-21 – 2015-02-22 (×2): 8 [IU] via SUBCUTANEOUS
  Administered 2015-02-22 – 2015-02-23 (×3): 4 [IU] via SUBCUTANEOUS
  Administered 2015-02-23: 2 [IU] via SUBCUTANEOUS
  Administered 2015-02-23 – 2015-02-24 (×2): 4 [IU] via SUBCUTANEOUS
  Administered 2015-02-24: 8 [IU] via SUBCUTANEOUS
  Administered 2015-02-24: 2 [IU] via SUBCUTANEOUS
  Administered 2015-02-25: 4 [IU] via SUBCUTANEOUS
  Administered 2015-02-25: 8 [IU] via SUBCUTANEOUS
  Administered 2015-02-25: 2 [IU] via SUBCUTANEOUS
  Administered 2015-02-26: 4 [IU] via SUBCUTANEOUS
  Administered 2015-02-27 – 2015-03-01 (×4): 2 [IU] via SUBCUTANEOUS
  Administered 2015-03-02: 8 [IU] via SUBCUTANEOUS
  Administered 2015-03-03: 2 [IU] via SUBCUTANEOUS
  Administered 2015-03-03: 4 [IU] via SUBCUTANEOUS
  Administered 2015-03-04: 2 [IU] via SUBCUTANEOUS
  Administered 2015-03-04: 4 [IU] via SUBCUTANEOUS
  Administered 2015-03-05 – 2015-03-06 (×3): 2 [IU] via SUBCUTANEOUS
  Administered 2015-03-06: 8 [IU] via SUBCUTANEOUS
  Administered 2015-03-06 – 2015-03-07 (×2): 4 [IU] via SUBCUTANEOUS
  Administered 2015-03-07: 12 [IU] via SUBCUTANEOUS
  Administered 2015-03-07: 4 [IU] via SUBCUTANEOUS
  Administered 2015-03-08: 2 [IU] via SUBCUTANEOUS

## 2015-02-20 MED ORDER — COLLAGENASE 250 UNIT/GM EX OINT
TOPICAL_OINTMENT | Freq: Every day | CUTANEOUS | Status: DC
  Administered 2015-02-21 – 2015-02-22 (×2): via TOPICAL
  Administered 2015-02-23 – 2015-02-24 (×2): 1 via TOPICAL
  Administered 2015-02-27 – 2015-03-01 (×3): via TOPICAL
  Administered 2015-03-02 – 2015-03-03 (×2): 1 via TOPICAL
  Administered 2015-03-04 – 2015-03-08 (×6): via TOPICAL
  Filled 2015-02-20: qty 30

## 2015-02-20 MED ORDER — PANTOPRAZOLE SODIUM 40 MG PO TBEC
40.0000 mg | DELAYED_RELEASE_TABLET | Freq: Every day | ORAL | Status: DC
  Administered 2015-02-20 – 2015-02-27 (×8): 40 mg via ORAL
  Filled 2015-02-20 (×8): qty 1

## 2015-02-20 MED ORDER — CLONAZEPAM 0.5 MG PO TABS
0.5000 mg | ORAL_TABLET | Freq: Three times a day (TID) | ORAL | Status: DC | PRN
  Administered 2015-02-20 – 2015-03-07 (×15): 0.5 mg via ORAL
  Filled 2015-02-20 (×15): qty 1

## 2015-02-20 MED ORDER — SIMVASTATIN 20 MG PO TABS
20.0000 mg | ORAL_TABLET | Freq: Every day | ORAL | Status: DC
  Administered 2015-02-20 – 2015-03-07 (×16): 20 mg via ORAL
  Filled 2015-02-20 (×18): qty 1

## 2015-02-20 NOTE — Progress Notes (Signed)
PT Cancellation Note  Patient Details Name: Tara Hopkins MRN: [id number] DOB: July 29, 1951   Cancelled Treatment:    Reason Eval/Treat Not Completed: Other (comment) Pt being discharged to CIR today.    Candy Sledge A 02/20/2015, 4:29 PM Candy Sledge, [place], DPT [phone]

## 2015-02-20 NOTE — PMR Pre-admission (Signed)
PMR Admission Coordinator Pre-Admission Assessment  [name] is an 64 y.o., female MRN: [id number] DOB: [birth year] Height: 5\' 5"  (165.1 cm) Weight: 79.8 kg (175 lb 14.8 oz)              Insurance Information HMO:    PPO:  Yes [employer]     PCP:      IPA:      80/20:      OTHER:  PRIMARY:  [employer]      Policy#: [id number]      Subscriber:  self CM Name:  Eliezer Lofts provided verbal auth and fax is pending Malachy Mood to follow up)    Phone#: [phone]     Fax#:  [phone] Pre-Cert#:  [id number]      Employer: disabled Benefits:  Phone #:  [phone]     Name:  [place] Eff. Date:  11/09/14     Deduct:  $700      Out of Pocket Max:  $3910      Life Max:  no CIR:  $233 per admission then 80%/20%      SNF:  80%/20% Outpatient: 80%/20%     Co-Pay: $52 copay per visit Home Health:  80%/20%      Co-Pay: DME:  80%/20%     Co-Pay:  Providers:  In network SECONDARY:       Policy#:       Subscriber:  CM Name:       Phone#:      Fax#:  Pre-Cert#:       Employer:  Benefits:  Phone #:      Name:  Eff. Date:      Deduct:       Out of Pocket Max:       Life Max:  CIR:       SNF:  Outpatient:      Co-Pay:  Home Health:       Co-Pay:  DME:      Co-Pay:   Medicaid Application Date:       Case Manager:  Disability Application Date:       Case Worker:   Emergency Contact Information Contact Information    Name Relation Home Work Mobile   Singac Daughter [phone]     Stell,Peter Spouse [phone]     Serita Grit   [phone]     Current Medical History  Patient Admitting Diagnosis: hypertensive right lenticular nucleus/corona radiate infarct History of Present Illness: Tara Hopkins is a 64 y.o. RH-female with history of HTN, DM, peripheral neuropathy, anxiety disorder; who was admitted via EMS on 02/15/15 with acute onset of left sided weakness and slurred speech. Upon EMS evaluation BP 220/130 and CBG of 224. She was given  labetalol 10mg  IV x 1 with improvement of BP to 167/71. UDS positive for cocaine. CT head negative and tPA administered. MRI/MRA brain was done revealing acute nonhemorrhagic infarcts in posterior aspect of right lenticular nucleus to posterior corona radiata and posterior limb of L-IC. 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 2D echo showed severe LVH, EF 55-60% and no regional wall abnormalities. BLE dopplers negative for DVT. Speech therapy evaluation reveals mild cognitive deficits in executive functioning and recall. She was started on ASA for secondary stroke prevention and TEE/loop recorder recommended for work up of bihemisphere strokes. 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 PT/OT evaluations pending And CIR recommended by MD for follow up therapy. Pt. Admitted to  CIR on 02/20/15 Total: 3 [facility]    Past Medical History  Past Medical History  Diagnosis Date  . Diabetes mellitus without complication   . Hypercholesteremia   . Peripheral neuropathy   . Hypertension     not on medications any longer  . Anxiety   . Depression   . History of blood transfusion   . History of hyperbaric oxygen therapy     pt reports 80 treatments.  . Anemia   . Glaucoma     Family History  family history includes Liver cancer in her mother; Lung cancer in her mother. There is no history of Colon cancer.  Prior Rehab/Hospitalizations: Pt. Has had 2 prior admissions to SNF [facility]).  She says the most recent was in February of 2016.  Joi has expressed that if pt. Needed SNF again, she would not select [facility].     Current Medications   Current facility-administered medications:  .  acetaminophen (TYLENOL) tablet 650 mg, 650 mg, Oral, Q4H PRN, 650 mg at 02/20/15 0702 **OR** acetaminophen (TYLENOL) suppository 650 mg, 650 mg, Rectal, Q4H PRN, Drema Dallas, DO .  aspirin tablet 325 mg, 325 mg, Oral, Daily, Drema Dallas, DO, 325 mg at 02/20/15 1038 .  clonazePAM (KLONOPIN) tablet 0.5 mg, 0.5 mg, Oral, TID PRN, Kathrynn Ducking, MD, 0.5 mg at 02/19/15 2126 .  collagenase (SANTYL) ointment, , Topical, Daily, Kathrynn Ducking, MD .  enoxaparin (LOVENOX) injection 40 mg, 40 mg, Subcutaneous, Q24H, Kathrynn Ducking, MD, 40 mg at 02/19/15 1949 .  gabapentin (NEURONTIN) capsule 600 mg, 600 mg, Oral, BID, Donzetta Starch, NP, 600 mg at 02/20/15 1038 .  glimepiride (AMARYL) tablet 4 mg, 4 mg, Oral, QAC breakfast, Kathrynn Ducking, MD, 4 mg at 02/20/15 0701 .  insulin aspart (novoLOG) injection 0-24 Units, 0-24 Units, Subcutaneous, TID WC, Donzetta Starch, NP, 8 Units at 02/19/15 1725 .  insulin glargine (LANTUS) injection 30 Units, 30 Units, Subcutaneous, QHS, Kathrynn Ducking, MD, 30 Units at 02/19/15 2126 .  lamoTRIgine (LAMICTAL) tablet 50 mg, 50 mg, Oral, BID, Donzetta Starch, NP, 50 mg at 02/20/15 1038 .  latanoprost (XALATAN) 0.005 % ophthalmic solution 1 drop, 1 drop, Both Eyes, QHS, Kathrynn Ducking, MD, 1 drop at 02/19/15 2126 .  linagliptin (TRADJENTA) tablet 5 mg, 5 mg, Oral, Daily, Kathrynn Ducking, MD, 5 mg at 02/20/15 1038 .  lisinopril (PRINIVIL,ZESTRIL) tablet 20 mg, 20 mg, Oral, BID, Rosalin Hawking, MD, 20 mg at 02/20/15 1038 .  pantoprazole (PROTONIX) EC tablet 40 mg, 40 mg, Oral, QHS, Garvin Fila, MD, 40 mg at 02/19/15 2126 .  senna-docusate (Senokot-S) tablet 1 tablet, 1 tablet, Oral, QHS PRN, Drema Dallas, DO .  simvastatin (ZOCOR) tablet 20 mg, 20 mg, Oral, QHS, Kathrynn Ducking, MD, 20 mg at 02/19/15 2126  Patients Current Diet: Diet Carb Modified Fluid consistency:: Thin; Room service appropriate?: Yes  Precautions / Restrictions Precautions Precautions: Fall Precaution Comments:  (R mid foot amputation ) Restrictions Weight Bearing Restrictions: No   Prior Activity Level Community (5-7x/wk): Pt. goes out several times per week with various family members transporting her.  She does not  drive.  Pt. indicates that she takes a daily walk at [place] / [place] Devices/Equipment: Civil engineer, contracting with back, Radio producer (specify quad or straight), Eyeglasses Theme park manager) Home Equipment: Wheelchair - manual, Bedside commode, Environmental consultant - 2 wheels, [place] - single  point, Shower seat  Prior Functional Level Prior Function Level of Independence: Independent with assistive device(s) Comments: Uses SPC on PRN basis; pt does not drive.   Current Functional Level Cognition  Arousal/Alertness: Awake/alert Overall Cognitive Status: Within Functional Limits for tasks assessed Orientation Level: Oriented X4 Attention: Sustained Sustained Attention: Appears intact Memory: Impaired Memory Impairment: Decreased recall of new information, Decreased short term memory Decreased Short Term Memory: Verbal basic Awareness: Appears intact Problem Solving: Appears intact Executive Function: Decision Making Decision Making: Appears intact Safety/Judgment: Appears intact [employer] Scales of Cognitive Functioning: Automatic/appropriate    Extremity Assessment (includes Sensation/Coordination)  Upper Extremity Assessment: LUE deficits/detail LUE Deficits / Details: decr scapula retraction, elevation adduction but AROM noted. 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 LLE Deficits / Details: hyperextension of knee with static standing. required blocking with static standing LLE Sensation:  (WFL. Proprioception WFL at hallux.) LLE Coordination: decreased gross motor, decreased fine motor    ADLs  Overall ADL's : Needs  assistance/impaired Eating/Feeding: Set up, Sitting Eating/Feeding Details (indicate cue type and reason): pt very motivated and CNA reports she became very frustrated but insisted on doing it alone. I didnt have to setup the tray but she managed the rest with alot of time Grooming: Wash/dry face, Supervision/safety, Sitting Grooming Details (indicate cue type and reason): R hand dominant Upper Body Bathing: Maximal assistance, Sitting Lower Body Bathing: +2 for physical assistance, Maximal assistance, Sit to/from stand Toilet Transfer: Maximal assistance Toilet Transfer Details (indicate cue type and reason): pt needs cues for hand placement and posterior lean with static standing. Pt does not have shoe for R LE at this time. General ADL Comments: Pt with elevated BP this session. Pt reports being upset during the day and demonstrates liable due to situation x2 during session. Pt states "aint it aweful to be this old and have no one to take care of you" Pt demonstrated static standing , shifting weight and weight bearing on LLE stepping with R LE. Pt attempting to alternate opposite side. Pt demonstates ability to weight bear R LE and lift L LE hip flexion    Mobility  Overal bed mobility: Needs Assistance Bed Mobility: Supine to Sit Supine to sit: Min assist, HOB elevated General bed mobility comments: Sitting in chair upon PT arrival.     Transfers  Overall transfer level: Needs assistance Equipment used: None, Rolling walker (2 wheeled) Transfers: Sit to/from Stand Sit to Stand: Mod assist Stand pivot transfers: +2 physical assistance, Mod assist General transfer comment: Cues for anterior translation and hand placement emphasizing forward lean to stand. Stood from Teacher, early years/pre / Gait / Stairs / Emergency planning/management officer  Ambulation/Gait Ambulation/Gait assistance: Mod assist, +2 physical assistance, Max assist Ambulation Distance (Feet): 50 Feet (+ 30') Assistive device: Rolling  walker (2 wheeled), None (Rail in hallway) Gait Pattern/deviations: Step-through pattern, Decreased stride length, Decreased weight shift to left, Narrow base of support, Drifts right/left, Staggering left, Staggering right Gait velocity interpretation: Below normal speed for age/gender General Gait Details: Pt required Max A of 2 for trunk control, advancement LLE and stabilization LLE during stance phase. Knee hyperextension thrust LLE during stance phase.  Assist to navigate RW and proximity as pt veered right. Ambulated with RW vs rail in hallway. gait mechanics improved using rail in hallway,    Posture / Balance Dynamic Sitting Balance Sitting balance - Comments: Able to perform static sitting with UE support however decreased trunk control noted during dynamic sitting activities (AROM LEs) requiring Min A to remain upright.  Balance Overall balance assessment: Needs assistance Sitting-balance support: Feet supported, No upper extremity supported Sitting balance-Leahy Scale: Fair Sitting balance - Comments: Able to perform static sitting with UE support however decreased trunk control noted during dynamic sitting activities (AROM LEs) requiring Min A to remain upright.  Postural control: Posterior lean Standing balance support: During functional activity Standing balance-Leahy Scale: Poor Standing balance comment: Relient on UE and external support for dynamic standing balance. Able to perform static standing with BUE support.    Special needs/care consideration BiPAP/CPAP  no CPM  no Continuous Drip IV   no Dialysis   no        Life Vest   no Oxygen  no Special Bed   no Trach Size   no Wound Vac (area)   no       Skin   Pt. Has a bandaged wound right medical lower leg.  Pt. Reports she goes to the wound clinic for management and uses santly to dress wound daily at home.                             Bowel mgmt: last BM 02/19/15; continent Bladder mgmt: continent and using BSC or toilet  with assist Diabetic mgmt   yes     Previous Home Environment Living Arrangements: Other (Comment) (lives with ex husband - but needs to be MOD I to return home)  Lives With: Alone Available Help at Discharge: Family, Available 24 hours/day (per daughter Lynder Parents) Type of Home: Apartment Home Layout: One level Home Access: Level entry Bathroom Shower/Tub: Chiropodist: Standard Home Care Services: No Additional Comments: pt has family in the area and a sister in [place]. Pt lives with ex husband but must be MOD I level to return home. Pt has x2 god daughters that can help with d/c   Discharge Living Setting Plans for Discharge Living Setting: House Type of Home at Discharge: House Discharge Home Layout: One level Discharge Home Access: Stairs to enter Entrance Stairs-Rails: None (pt. says there is a "post" closeby) Entrance Stairs-Number of Steps: 3 Discharge Bathroom Shower/Tub: Tub/shower unit Discharge Bathroom Toilet: Standard Discharge Bathroom Accessibility: Yes How Accessible: Accessible via walker Does the patient have any problems obtaining your medications?: Yes (Describe) (difficulty affording insulin and santyl per pt.)  Social/Family/Support Systems Patient Roles: Parent (pt. reports she is separated from her husband) Contact Information: Lynder Parents (daughter) [phone];  Christian Mate (son in law) [phone]; Karrie Meres (goddaughter pt. will be living with per [facility]) [phone] Anticipated Caregiver: Karrie Meres, goddaughter will be primary caregiver per daughter Sharlot Gowda.  Kenney Houseman does not work outside of the home .  Pt. states that Kenney Houseman has been a stong presence in her life since [place] passed away.  Pt. promised her friend, Tonya's mom that Kenney Houseman would always be cared for.  Now the plan is for Tonya to care for Ms. Chesnut.   Ability/Limitations of Caregiver: Per Joi, pt. will have 24 hour care between [facility], herself and numerous  siblings of the patinet.  Joi has 5 children from  72-90 years of age and is in school at [employer].  She seems quite involved in her Mom's care despite her own busy life. Caregiver Availability: 24/7 Discharge Plan Discussed with Primary Caregiver: Yes Is Caregiver In Agreement with Plan?: Yes Does Caregiver/Family have Issues with Lodging/Transportation while Pt is in Rehab?: No    Goals/Additional Needs Patient/Family Goal for Rehab: mod I PT; Mod I and supervision OT; n/a SLP Expected length of stay: 13-20 days Cultural Considerations: "[facility]" Equipment Needs: TBA Pt/Family Agrees to Admission and willing to participate: Yes Program Orientation Provided & Reviewed with Pt/Caregiver Including Roles  & Responsibilities: Yes Additional Information Needs: Lynder Parents should be primary contact for information    Decrease burden of Care through IP rehab admission: no   Possible need for SNF placement upon discharge:    Not anticipated   Patient Condition: This patient's condition remains as documented in the consult dated 02/18/15 at 1536, in which the Rehabilitation Physician determined and documented that the patient's condition is appropriate for intensive rehabilitative care in an inpatient rehabilitation facility. Will admit to inpatient rehab today.  Preadmission Screen Completed By:  Gerlean Ren, 02/20/2015 12:12 PM ______________________________________________________________________   Discussed status with Dr. Naaman Plummer on 02/20/15 at  1505  and received telephone approval for admission today.  Admission Coordinator:  Gerlean Ren, [id number] Sudie Grumbling 02/20/15

## 2015-02-20 NOTE — Progress Notes (Signed)
Gerlean Ren Rehab Admission Coordinator Signed Physical Medicine and Rehabilitation PMR Pre-admission 02/20/2015 12:02 PM  Related encounter: ED to Hosp-Admission (Current) from 02/15/2015 in [place] Collapse All   PMR Admission Coordinator Pre-Admission Assessment  [name] is an 64 y.o., female MRN: [id number] DOB: 07-01-1951 Height: 5\' 5"  (165.1 cm) Weight: 79.8 kg (175 lb 14.8 oz)  Insurance Information HMO: PPO: Yes Blue Options PCP: IPA: 80/20: OTHER:  PRIMARY: Blue [employer] Policy#: [id number] Subscriber: self CM Name: Eliezer Lofts provided verbal auth and fax is pending Malachy Mood to follow up) Phone#: [phone] Fax#: [phone] Pre-Cert#: [id number] Employer: disabled Benefits: Phone #: [phone] Name: Chalice Eff. Date: 11/09/14 Deduct: $700 Out of Pocket Max: $3910 Life Max: no CIR: $233 per admission then 80%/20% SNF: 80%/20% Outpatient: 80%/20% Co-Pay: $52 copay per visit Home Health: 80%/20% Co-Pay: DME: 80%/20% Co-Pay:  Providers: In network SECONDARY: Policy#: Subscriber:  CM Name: Phone#: Fax#:  Pre-Cert#: Employer:  Benefits: Phone #: Name:  Eff. Date: Deduct: Out of Pocket Max: Life Max:  CIR: SNF:  Outpatient: Co-Pay:  Home Health: Co-Pay:  DME: Co-Pay:   Medicaid Application Date: Case Manager:  Disability Application Date: Case Worker:   Emergency Contact Information Contact Information    Name Relation Home Work Mobile   [place] Daughter [phone]     Rodrigue,Peter Spouse [phone]      Serita Grit   [phone]     Current Medical History  Patient Admitting Diagnosis: hypertensive right lenticular nucleus/corona radiate infarct History of Present Illness: Tara Hopkins is a 64 y.o. RH-female with history of HTN, DM, peripheral neuropathy, anxiety disorder; who was admitted via EMS on 02/15/15 with acute onset of left sided weakness and slurred speech. Upon EMS evaluation BP 220/130 and CBG of 224. She was given labetalol 10mg  IV x 1 with improvement of BP to 167/71. UDS positive for cocaine. CT head negative and tPA administered. MRI/MRA brain was done revealing acute nonhemorrhagic infarcts in posterior aspect of right lenticular nucleus to posterior corona radiata and posterior limb of L-IC. 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 2D echo showed severe LVH, EF 55-60% and no regional wall abnormalities. BLE dopplers negative for DVT. Speech therapy evaluation reveals mild cognitive deficits in executive functioning and recall. She was started on ASA for secondary stroke prevention and TEE/loop recorder recommended for work up of bihemisphere strokes. 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 PT/OT evaluations pending And CIR recommended by MD for follow up therapy. Pt. Admitted to CIR on 02/20/15 Total: 3 NIH    Past Medical History  Past Medical History  Diagnosis Date  . Diabetes mellitus without complication   . Hypercholesteremia   . Peripheral neuropathy   . Hypertension     not on medications any longer  . Anxiety   . Depression   . History of blood transfusion   . History of hyperbaric oxygen therapy     pt reports 80 treatments.  . Anemia   . Glaucoma     Family History  family history includes Liver cancer in her mother; Lung cancer in her mother. There is no history of Colon cancer.  Prior Rehab/Hospitalizations: Pt. Has had 2 prior  admissions to SNF [facility]). She says the most recent was in February of 2016. Joi has expressed that if pt. Needed SNF again, she would not select [facility].   Current  Medications   Current facility-administered medications:  . acetaminophen (TYLENOL) tablet 650 mg, 650 mg, Oral, Q4H PRN, 650 mg at 02/20/15 0702 **OR** acetaminophen (TYLENOL) suppository 650 mg, 650 mg, Rectal, Q4H PRN, Drema Dallas, DO . aspirin tablet 325 mg, 325 mg, Oral, Daily, Drema Dallas, DO, 325 mg at 02/20/15 1038 . clonazePAM (KLONOPIN) tablet 0.5 mg, 0.5 mg, Oral, TID PRN, Kathrynn Ducking, MD, 0.5 mg at 02/19/15 2126 . collagenase (SANTYL) ointment, , Topical, Daily, Kathrynn Ducking, MD . enoxaparin (LOVENOX) injection 40 mg, 40 mg, Subcutaneous, Q24H, Kathrynn Ducking, MD, 40 mg at 02/19/15 1949 . gabapentin (NEURONTIN) capsule 600 mg, 600 mg, Oral, BID, Donzetta Starch, NP, 600 mg at 02/20/15 1038 . glimepiride (AMARYL) tablet 4 mg, 4 mg, Oral, QAC breakfast, Kathrynn Ducking, MD, 4 mg at 02/20/15 0701 . insulin aspart (novoLOG) injection 0-24 Units, 0-24 Units, Subcutaneous, TID WC, Donzetta Starch, NP, 8 Units at 02/19/15 1725 . insulin glargine (LANTUS) injection 30 Units, 30 Units, Subcutaneous, QHS, Kathrynn Ducking, MD, 30 Units at 02/19/15 2126 . lamoTRIgine (LAMICTAL) tablet 50 mg, 50 mg, Oral, BID, Donzetta Starch, NP, 50 mg at 02/20/15 1038 . latanoprost (XALATAN) 0.005 % ophthalmic solution 1 drop, 1 drop, Both Eyes, QHS, Kathrynn Ducking, MD, 1 drop at 02/19/15 2126 . linagliptin (TRADJENTA) tablet 5 mg, 5 mg, Oral, Daily, Kathrynn Ducking, MD, 5 mg at 02/20/15 1038 . lisinopril (PRINIVIL,ZESTRIL) tablet 20 mg, 20 mg, Oral, BID, Rosalin Hawking, MD, 20 mg at 02/20/15 1038 . pantoprazole (PROTONIX) EC tablet 40 mg, 40 mg, Oral, QHS, Garvin Fila, MD, 40 mg at 02/19/15 2126 . senna-docusate (Senokot-S) tablet 1 tablet, 1 tablet, Oral, QHS PRN, Drema Dallas,  DO . simvastatin (ZOCOR) tablet 20 mg, 20 mg, Oral, QHS, Kathrynn Ducking, MD, 20 mg at 02/19/15 2126  Patients Current Diet: Diet Carb Modified Fluid consistency:: Thin; Room service appropriate?: Yes  Precautions / Restrictions Precautions Precautions: Fall Precaution Comments: (R mid foot amputation ) Restrictions Weight Bearing Restrictions: No   Prior Activity Level Community (5-7x/wk): Pt. goes out several times per week with various family members transporting her. She does not drive. Pt. indicates that she takes a daily walk at [place] / [place] Devices/Equipment: Civil engineer, contracting with back, Radio producer (specify quad or straight), Eyeglasses Theme park manager) Home Equipment: Wheelchair - manual, Bedside commode, Environmental consultant - 2 wheels, Cane - single point, Shower seat  Prior Functional Level Prior Function Level of Independence: Independent with assistive device(s) Comments: Uses SPC on PRN basis; pt does not drive.   Current Functional Level Cognition  Arousal/Alertness: Awake/alert Overall Cognitive Status: Within Functional Limits for tasks assessed Orientation Level: Oriented X4 Attention: Sustained Sustained Attention: Appears intact Memory: Impaired Memory Impairment: Decreased recall of new information, Decreased short term memory Decreased Short Term Memory: Verbal basic Awareness: Appears intact Problem Solving: Appears intact Executive Function: Decision Making Decision Making: Appears intact Safety/Judgment: Appears intact [employer] Scales of Cognitive Functioning: Automatic/appropriate   Extremity Assessment (includes Sensation/Coordination)  Upper Extremity Assessment: LUE deficits/detail LUE Deficits / Details: decr scapula retraction, elevation adduction but AROM noted. 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 LLE Deficits / Details: hyperextension of knee with static standing. required blocking with static standing LLE Sensation: (WFL. Proprioception WFL at hallux.) LLE Coordination: decreased gross motor, decreased fine motor    ADLs  Overall ADL's : Needs assistance/impaired Eating/Feeding: Set up, Sitting Eating/Feeding Details (indicate cue type and reason): pt very motivated and CNA reports she became very frustrated but insisted on doing it alone. I didnt have to setup the tray but she managed the rest with alot of time Grooming: Wash/dry face, Supervision/safety, Sitting Grooming Details (indicate cue type and reason): R hand dominant Upper Body Bathing: Maximal assistance, Sitting Lower Body Bathing: +2 for physical assistance, Maximal assistance, Sit to/from stand Toilet Transfer: Maximal assistance Toilet Transfer Details (indicate cue type and reason): pt needs cues for hand placement and posterior lean with static standing. Pt does not have shoe for R LE at this time. General ADL Comments: Pt with elevated BP this session. Pt reports being upset during the day and demonstrates liable due to situation x2 during session. Pt states "aint it aweful to be this old and have no one to take care of you" Pt demonstrated static standing , shifting weight and weight bearing on LLE stepping with R LE. Pt attempting to alternate opposite side. Pt demonstates ability to weight bear R LE and lift L LE hip flexion    Mobility  Overal bed mobility: Needs Assistance Bed Mobility: Supine to Sit Supine to sit: Min assist, HOB elevated General bed mobility comments: Sitting in chair upon PT arrival.     Transfers  Overall transfer level: Needs assistance Equipment used:  None, Rolling walker (2 wheeled) Transfers: Sit to/from Stand Sit to Stand: Mod assist Stand pivot transfers: +2 physical assistance, Mod assist General transfer comment: Cues for anterior translation and hand placement emphasizing forward lean to stand. Stood from Teacher, early years/pre / Gait / Stairs / Emergency planning/management officer  Ambulation/Gait Ambulation/Gait assistance: Mod assist, +2 physical assistance, Max assist Ambulation Distance (Feet): 50 Feet (+ 30') Assistive device: Rolling walker (2 wheeled), None (Rail in hallway) Gait Pattern/deviations: Step-through pattern, Decreased stride length, Decreased weight shift to left, Narrow base of support, Drifts right/left, Staggering left, Staggering right Gait velocity interpretation: Below normal speed for age/gender General Gait Details: Pt required Max A of 2 for trunk control, advancement LLE and stabilization LLE during stance phase. Knee hyperextension thrust LLE during stance phase. Assist to navigate RW and proximity as pt veered right. Ambulated with RW vs rail in hallway. gait mechanics improved using rail in hallway,    Posture / Balance Dynamic Sitting Balance Sitting balance - Comments: Able to perform static sitting with UE support however decreased trunk control noted during dynamic sitting activities (AROM LEs) requiring Min A to remain upright.  Balance Overall balance assessment: Needs assistance Sitting-balance support: Feet supported, No upper extremity supported Sitting balance-Leahy Scale: Fair Sitting balance - Comments: Able to perform static sitting with UE support however decreased trunk control noted during dynamic sitting activities (AROM LEs) requiring Min A to remain upright.  Postural control: Posterior lean Standing balance support: During functional activity Standing balance-Leahy Scale: Poor Standing balance comment: Relient on UE and external support for dynamic standing balance. Able to perform  static standing with BUE support.    Special needs/care consideration BiPAP/CPAP no CPM no Continuous Drip IV no Dialysis no  Life Vest no Oxygen no Special Bed no Trach Size no Wound Vac (area) no  Skin  Pt. Has a bandaged wound right medical lower leg. Pt. Reports she goes to the wound clinic for management and uses santly to dress wound daily at home.   Bowel mgmt: last BM 02/19/15; continent Bladder mgmt: continent and using BSC or toilet with assist Diabetic mgmt yes     Previous Home Environment Living Arrangements: Other (Comment) (lives with ex husband - but needs to be MOD I to return home) Lives With: Alone Available Help at Discharge: Family, Available 24 hours/day (per daughter Lynder Parents) Type of Home: Apartment Home Layout: One level Home Access: Level entry Bathroom Shower/Tub: Chiropodist: Standard Home Care Services: No Additional Comments: pt has family in the area and a sister in [place]. Pt lives with ex husband but must be MOD I level to return home. Pt has x2 god daughters that can help with d/c   Discharge Living Setting Plans for Discharge Living Setting: House Type of Home at Discharge: House Discharge Home Layout: One level Discharge Home Access: Stairs to enter Entrance Stairs-Rails: None (pt. says there is a "post" closeby) Entrance Stairs-Number of Steps: 3 Discharge Bathroom Shower/Tub: Tub/shower unit Discharge Bathroom Toilet: Standard Discharge Bathroom Accessibility: Yes How Accessible: Accessible via walker Does the patient have any problems obtaining your medications?: Yes (Describe) (difficulty affording insulin and santyl per pt.)  Social/Family/Support Systems Patient Roles: Parent (pt. reports she is separated from her husband) Contact Information: Lynder Parents (daughter) [phone]; Christian Mate (son in law) [phone]; Karrie Meres  (goddaughter pt. will be living with per [facility]) [phone] Anticipated Caregiver: Karrie Meres, goddaughter will be primary caregiver per daughter Sharlot Gowda. Kenney Houseman does not work outside of the home . Pt. states that Kenney Houseman has been a stong presence in her life since [place] passed away. Pt. promised her friend, Tonya's mom that Kenney Houseman would always be cared for. Now the plan is for Tonya to care for Ms. Chesnut.  Ability/Limitations of Caregiver: Per Joi, pt. will have 24 hour care between [facility], herself and numerous siblings of the patinet. Joi has 5 children from 19-74 years of age and is in school at [employer]. She seems quite involved in her Mom's care despite her own busy life. Caregiver Availability: 24/7 Discharge Plan Discussed with Primary Caregiver: Yes Is Caregiver In Agreement with Plan?: Yes Does Caregiver/Family have Issues with Lodging/Transportation while Pt is in Rehab?: No    Goals/Additional Needs Patient/Family Goal for Rehab: mod I PT; Mod I and supervision OT; n/a SLP Expected length of stay: 13-20 days Cultural Considerations: "[facility]" Equipment Needs: TBA Pt/Family Agrees to Admission and willing to participate: Yes Program Orientation Provided & Reviewed with Pt/Caregiver Including Roles & Responsibilities: Yes Additional Information Needs: Lynder Parents should be primary contact for information    Decrease burden of Care through IP rehab admission: no   Possible need for SNF placement upon discharge: Not anticipated   Patient Condition: This patient's condition remains as documented in the consult dated 02/18/15 at 1536, in which the Rehabilitation Physician determined and documented that the patient's condition is appropriate for intensive rehabilitative care in an inpatient rehabilitation facility. Will admit to inpatient rehab today.  Preadmission Screen Completed By: Gerlean Ren, 02/20/2015 12:12  PM ______________________________________________________________________  Discussed status with Dr. Naaman Plummer on 02/20/15 at 1505 and received telephone approval for admission today.  Admission Coordinator: Gerlean Ren [id number] Sudie Grumbling 02/20/15          Cosigned by: Meredith Staggers, MD at 02/20/2015 3:11 PM  Revision History
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
## 2015-02-20 NOTE — Progress Notes (Signed)
Tara Staggers, MD Physician Signed Physical Medicine and Rehabilitation Consult Note 02/18/2015 10:21 AM  Related encounter: ED to Hosp-Admission (Current) from 02/15/2015 in [place] Collapse All        Physical Medicine and Rehabilitation Consult  Reason for Consult: Left sided weakness Referring Physician: Dr. [name]    HPI: Tara Hopkins is a 64 y.o. RH-female with history of HTN, DM, peripheral neuropathy, anxiety disorder; who was admitted via EMS on 02/15/15 with acute onset of left sided weakness and slurred speech. Upon EMS evaluation BP 220/130 and CBG of 224. She was given labetalol 10mg  IV x 1 with improvement of BP to 167/71. UDS positive for cocaine. CT head negative and tPA administered. MRI/MRA brain was done revealing acute nonhemorrhagic infarcts in posterior aspect of right lenticular nucleus to posterior corona radiata and posterior limb of L-IC. 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 2D echo showed severe LVH, EF 55-60% and no regional wall abnormalities. BLE dopplers negative for DVT. Speech therapy evaluation reveals mild cognitive deficits in executive functioning and recall. She was started on ASA for secondary stroke prevention and TEE/loop recorder recommended for work up of bihemisphere strokes. 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 PT/OT evaluations pending And CIR recommended by MD for follow up therapy.    Review of Systems  HENT: Negative for hearing loss.  Eyes: Negative for blurred vision and double vision.  Respiratory: Positive for cough. Negative for shortness of breath and wheezing.  Cardiovascular: Negative for chest pain and palpitations.  Gastrointestinal: Negative for heartburn and nausea.  Musculoskeletal: Positive for falls (was set to start OP therapy ). Negative for back pain and neck pain.   Gait disorder--does  not like using AD.  Neurological: Positive for sensory change (numbness BLE due to neuropathy) and focal weakness. Negative for dizziness, tingling and headaches.      Past Medical History  Diagnosis Date  . Diabetes mellitus without complication   . Hypercholesteremia   . Peripheral neuropathy   . Hypertension     not on medications any longer  . Anxiety   . Depression   . History of blood transfusion   . History of hyperbaric oxygen therapy     pt reports 80 treatments.  . Anemia   . Glaucoma     Past Surgical History  Procedure Laterality Date  . I&d extremity Right 05/23/2013    Procedure: IRRIGATION AND DEBRIDEMENT RIGHT GREAT TOE; Surgeon: Mauri Pole, MD; Location: [facility]; Service: Orthopedics; Laterality: Right;  . Amputation Right 12/15/2013    Procedure: Right Transmetatarsal Amputation; Surgeon: Newt Minion, MD; Location: [facility]; Service: Orthopedics; Laterality: Right;  . Amputation Right 02/23/2014    Procedure: AMPUTATION FOOT; Surgeon: Newt Minion, MD; Location: [facility]; Service: Orthopedics; Laterality: Right; Right Foot Revision Transmetatarsal Amputation    Family History  Problem Relation Age of Onset  . Colon cancer Neg Hx   . Liver cancer Mother   . Lung cancer Mother     Social History: Lives alone. Independent PTA--but unsafe without AD (has cane, walker and WC) per family. Daughter and niece assist as needed. She reports that she has been smoking Cigarettes--1/2 PPD. She has a .2 pack-year smoking history. She has never used smokeless tobacco. She reports that she drinks beer once a month. She reports that she does not use illicit drugs.    Allergies: No  Known Allergies    Medications Prior to Admission  Medication Sig Dispense Refill  . acetaminophen (TYLENOL) 500 MG tablet Take 1,000 mg by mouth every 6 (six) hours as needed for  headache.    . clonazePAM (KLONOPIN) 0.5 MG tablet take 1 tablet by mouth three times a day if needed (Patient taking differently: take 1 tablet by mouth three times a day if needed for anxiety) 90 tablet 1  . COD LIVER OIL PO Take 1 capsule by mouth daily.    . collagenase (SANTYL) ointment Apply 1 application topically at bedtime. Apply to right ankle for wound care    . gabapentin (NEURONTIN) 400 MG capsule Take 2 capsules (800 mg total) by mouth 2 (two) times daily. 120 capsule 4  . glimepiride (AMARYL) 4 MG tablet Take 1 tablet (4 mg total) by mouth daily before breakfast. 90 tablet 0  . insulin aspart (NOVOLOG FLEXPEN) 100 UNIT/ML FlexPen Inject 2-6 units into skin 3 times daily (Patient taking differently: Inject 4 Units into the skin 3 (three) times daily as needed for high blood sugar (CBG>130). ) 15 mL 0  . insulin glargine (LANTUS) 100 UNIT/ML injection Inject 0.3 mLs (30 Units total) into the skin at bedtime. 10 mL 2  . lamoTRIgine (LAMICTAL) 100 MG tablet Take 0.5 tablets (50 mg total) by mouth 2 (two) times daily. 30 tablet 3  . latanoprost (XALATAN) 0.005 % ophthalmic solution place 1 drop into both eyes at bedtime 2.5 mL 12  . lisinopril (PRINIVIL,ZESTRIL) 10 MG tablet take 1 tablet by mouth once daily 30 tablet 3  . simvastatin (ZOCOR) 20 MG tablet Take 1 tablet (20 mg total) by mouth at bedtime. 90 tablet 0  . white petrolatum (VASELINE) GEL Apply 1 application topically at bedtime. Apply to both feet    . glucose blood test strip Use as instructed 100 each 12  . ONE TOUCH LANCETS MISC USE TO CHECK BLOOD SUGAR 3 TIMES DAILY 200 each 3  . rosuvastatin (CRESTOR) 10 MG tablet Take 1 tablet (10 mg total) by mouth daily. (Patient not taking: Reported on 02/16/2015) 90 tablet 0  . saxagliptin HCl (ONGLYZA) 5 MG TABS tablet Take 1 tablet (5 mg total) by mouth daily. (Patient not taking: Reported on 02/17/2015) 90  tablet 0  . zinc gluconate 50 MG tablet Take 50 mg by mouth 2 (two) times daily.      Home: Home Living Family/patient expects to be discharged to:: Private residence Living Arrangements: Alone Available Help at Discharge: Friend(s) Type of Home: House Lives With: Alone (Pt has a girlfriend that helps her out.)  Functional History:   Functional Status:  Mobility:          ADL:    Cognition: Cognition Overall Cognitive Status: Impaired/Different from baseline Arousal/Alertness: Awake/alert Orientation Level: Oriented X4 Attention: Sustained Sustained Attention: Appears intact Memory: Impaired Memory Impairment: Decreased recall of new information, Decreased short term memory Decreased Short Term Memory: Verbal basic Awareness: Appears intact Problem Solving: Appears intact Executive Function: Decision Making Decision Making: Appears intact Safety/Judgment: Appears intact [employer] Scales of Cognitive Functioning: Automatic/appropriate Cognition Overall Cognitive Status: Impaired/Different from baseline  Blood pressure 141/92, pulse 69, temperature 98 F (36.7 C), temperature source Oral, resp. rate 16, height 5\' 5"  (1.651 m), weight 79.8 kg (175 lb 14.8 oz), SpO2 97 %. Physical Exam  Nursing note and vitals reviewed. Constitutional: She is oriented to person, place, and time. She appears well-developed and well-nourished.  HENT:  Head: Normocephalic  and atraumatic.  Eyes: Conjunctivae are normal. Pupils are equal, round, and reactive to light.  Neck: Normal range of motion. Neck supple.  Cardiovascular: Normal rate and regular rhythm.  Respiratory: Effort normal. She has wheezes. She exhibits no tenderness.  Congested cough noted.  GI: Soft. Bowel sounds are normal. She exhibits no distension. There is no tenderness.  Musculoskeletal: She exhibits no edema.  Old right transmet amputation site well healed.  Neurological: She is alert and  oriented to person, place, and time.  Left facial weakness. Speech clear. Able to follow basic commands without difficulty. Impulsivity noted with poor safety awareness. LUE: 3- deltoid, 3 bicep and tricep, 3 wrist and HI. LLE: 3 HF, 3KE and 3+ ankle. RUE and RLE 4 to 4+/5. Decreased PP and LT in both feet.  Skin: Skin is warm and dry.  Psychiatric: She has a normal mood and affect. Her behavior is normal. Judgment and thought content normal.     Lab Results Last 24 Hours    Results for orders placed or performed during the hospital encounter of 02/15/15 (from the past 24 hour(s))  Glucose, capillary Status: Abnormal   Collection Time: 02/17/15 11:13 AM  Result Value Ref Range   Glucose-Capillary 180 (H) 70 - 99 mg/dL  Glucose, capillary Status: Abnormal   Collection Time: 02/17/15 4:15 PM  Result Value Ref Range   Glucose-Capillary 177 (H) 70 - 99 mg/dL  Glucose, capillary Status: Abnormal   Collection Time: 02/17/15 9:44 PM  Result Value Ref Range   Glucose-Capillary 242 (H) 70 - 99 mg/dL   Comment 1 Notify RN    Comment 2 Document in Chart   Glucose, capillary Status: Abnormal   Collection Time: 02/18/15 6:44 AM  Result Value Ref Range   Glucose-Capillary 151 (H) 70 - 99 mg/dL   Comment 1 Notify RN    Comment 2 Document in Chart       Imaging Results (Last 48 hours)    Ct Angio Neck W/cm &/or Wo/cm  02/18/2015 CLINICAL DATA: Acute onset LEFT-sided weakness beginning at 2130 hours. Slurred speech. Hypertension. History of diabetes. EXAM: CT ANGIOGRAPHY NECK TECHNIQUE: Multidetector CT imaging of the neck was performed using the standard protocol during bolus administration of intravenous contrast. Multiplanar CT image reconstructions and MIPs were obtained to evaluate the vascular anatomy. Carotid stenosis measurements (when applicable) are obtained utilizing NASCET criteria, using the distal  internal carotid diameter as the denominator. CONTRAST: 153mL OMNIPAQUE IOHEXOL 350 MG/ML SOLN COMPARISON: MRI of the brain February 16, 2015 FINDINGS: Normal appearance of the thoracic arch, normal branch pattern. The origins of the innominate, left Common carotid artery and subclavian artery appear patent no, limited assessment due to streak artifact from retained LEFT subclavian venous contrast Bilateral Common carotid arteries are widely patent, coursing in a straight line fashion. Eccentric intimal thickening and calcific atherosclerosis of the LEFT greater than RIGHT carotid bulb resulting in 50% stenosis of LEFT internal carotid artery origin by NASCET criteria with poststenotic dilatation. RIGHT vertebral artery is dominant. Normal appearance of the vertebral arteries, which appear widely patent. No hemodynamically significant stenosis by NASCET criteria. No dissection, no pseudoaneurysm. No abnormal luminal irregularity. No contrast extravasation. Soft tissues are nonacute. No acute osseous process though bone windows have not been submitted. Moderate upper cervical facet arthropathy. Moderate to severe C5-6 disc height loss, ventral endplate spurring and endplate sclerosis consistent with degenerative disc. 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 No large vessel occlusion. Electronically Signed By: Elon Alas  On: 02/18/2015 04:28   Mr Brain Wo Contrast  02/16/2015 CLINICAL DATA: 64 year old hypertensive diabetic female with hyperlipidemia presenting with acute onset of left-sided weakness. Subsequent encounter. EXAM: MRI HEAD WITHOUT CONTRAST MRA HEAD WITHOUT CONTRAST TECHNIQUE: Multiplanar, multiecho pulse sequences of the brain and surrounding structures were obtained without intravenous contrast. Angiographic images of the head were obtained using MRA technique without contrast. COMPARISON: 02/15/2015 head CT. No comparison brain MR. FINDINGS: MRI  HEAD FINDINGS Acute nonhemorrhagic infarct extends from the superior posterior aspect of the right lenticular nucleus into the posterior corona radiata. Small acute nonhemorrhagic infarct mid posterior limb of the left internal capsule. Remote right thalamic infarct. Moderate small vessel disease type changes. No intracranial mass lesion noted on this unenhanced exam. Mild atrophy without hydrocephalus. Mild exophthalmos. Mild spinal stenosis C3-4 level. Transverse ligament hypertrophy. Cervical medullary junction, pituitary region and pineal region unremarkable. Minimal paranasal sinus mucosal thickening. MRA HEAD FINDINGS Mild to moderate narrowing at the junction of the left internal carotid artery cavernous/pre cavernous segment. Mild narrowing right internal carotid artery cavernous/ pre cavernous sinus junction. Mild ectasia of the cavernous segment of the internal carotid artery bilaterally. Mild narrowing supraclinoid segment of the internal carotid artery bilaterally. Middle cerebral artery moderate branch vessel narrowing and irregularity bilaterally. Ectatic dominant right vertebral artery without high-grade stenosis. Left vertebral artery predominantly ends in a posterior inferior cerebellar artery distribution with only small vessel extending to form the basilar artery. Nonvisualized right posterior inferior cerebellar artery. Ectatic slightly irregular basilar artery with mild narrowing. Nonvisualized left anterior inferior cerebellar artery. Posterior cerebral artery distal branch vessel irregularity with moderate narrowing greater on the left. No aneurysm noted. IMPRESSION: MRI HEAD Acute nonhemorrhagic infarct extends from the superior posterior aspect of the right lenticular nucleus into the posterior corona radiata. Small acute nonhemorrhagic infarct mid posterior limb of the left internal capsule. Remote right thalamic infarct. Moderate small vessel disease type  changes. Mild atrophy. Mild exophthalmos. Mild spinal stenosis C3-4 level. MRA HEAD FINDINGS Intracranial atherosclerotic type changes as detailed above. Electronically Signed By: Genia Del M.D. On: 02/16/2015 23:24   Mr Jodene Nam Head/brain Wo Cm  02/16/2015 CLINICAL DATA: 64 year old hypertensive diabetic female with hyperlipidemia presenting with acute onset of left-sided weakness. Subsequent encounter. EXAM: MRI HEAD WITHOUT CONTRAST MRA HEAD WITHOUT CONTRAST TECHNIQUE: Multiplanar, multiecho pulse sequences of the brain and surrounding structures were obtained without intravenous contrast. Angiographic images of the head were obtained using MRA technique without contrast. COMPARISON: 02/15/2015 head CT. No comparison brain MR. FINDINGS: MRI HEAD FINDINGS Acute nonhemorrhagic infarct extends from the superior posterior aspect of the right lenticular nucleus into the posterior corona radiata. Small acute nonhemorrhagic infarct mid posterior limb of the left internal capsule. Remote right thalamic infarct. Moderate small vessel disease type changes. No intracranial mass lesion noted on this unenhanced exam. Mild atrophy without hydrocephalus. Mild exophthalmos. Mild spinal stenosis C3-4 level. Transverse ligament hypertrophy. Cervical medullary junction, pituitary region and pineal region unremarkable. Minimal paranasal sinus mucosal thickening. MRA HEAD FINDINGS Mild to moderate narrowing at the junction of the left internal carotid artery cavernous/pre cavernous segment. Mild narrowing right internal carotid artery cavernous/ pre cavernous sinus junction. Mild ectasia of the cavernous segment of the internal carotid artery bilaterally. Mild narrowing supraclinoid segment of the internal carotid artery bilaterally. Middle cerebral artery moderate branch vessel narrowing and irregularity bilaterally. Ectatic dominant right vertebral artery without high-grade stenosis. Left  vertebral artery predominantly ends in a posterior inferior cerebellar artery distribution with only small vessel extending to  form the basilar artery. Nonvisualized right posterior inferior cerebellar artery. Ectatic slightly irregular basilar artery with mild narrowing. Nonvisualized left anterior inferior cerebellar artery. Posterior cerebral artery distal branch vessel irregularity with moderate narrowing greater on the left. No aneurysm noted. IMPRESSION: MRI HEAD Acute nonhemorrhagic infarct extends from the superior posterior aspect of the right lenticular nucleus into the posterior corona radiata. Small acute nonhemorrhagic infarct mid posterior limb of the left internal capsule. Remote right thalamic infarct. Moderate small vessel disease type changes. Mild atrophy. Mild exophthalmos. Mild spinal stenosis C3-4 level. MRA HEAD FINDINGS Intracranial atherosclerotic type changes as detailed above. Electronically Signed By: Genia Del M.D. On: 02/16/2015 23:24     Assessment/Plan: Diagnosis: hypertensive right lenticular nucleus/corona radiate infarct 1. Does the need for close, 24 hr/day medical supervision in concert with the patient's rehab needs make it unreasonable for this patient to be served in a less intensive setting? Yes 2. Co-Morbidities requiring supervision/potential complications: htn, dm with dpn, right TMA, renal insuffiiency 3. Due to bladder management, bowel management, safety, skin/wound care, disease management, medication administration, pain management and patient education, does the patient require 24 hr/day rehab nursing? Yes 4. Does the patient require coordinated care of a physician, rehab nurse, PT (1-2 hrs/day, 5 days/week) and OT (1-2 hrs/day, 5 days/week) to address physical and functional deficits in the context of the above medical diagnosis(es)? Yes Addressing deficits in the following areas: balance, endurance, locomotion, strength,  transferring, bowel/bladder control, bathing, dressing, feeding, grooming, toileting and psychosocial support 5. Can the patient actively participate in an intensive therapy program of at least 3 hrs of therapy per day at least 5 days per week? Yes 6. The potential for patient to make measurable gains while on inpatient rehab is excellent 7. Anticipated functional outcomes upon discharge from inpatient rehab are modified independent with PT, modified independent and supervision with OT, n/a with SLP. 8. Estimated rehab length of stay to reach the above functional goals is: 13-20 days 9. Does the patient have adequate social supports and living environment to accommodate these discharge functional goals? Yes 10. Anticipated D/C setting: Home 11. Anticipated post D/C treatments: HH therapy and Outpatient therapy 12. Overall Rehab/Functional Prognosis: excellent  RECOMMENDATIONS: This patient's condition is appropriate for continued rehabilitative care in the following setting: CIR Patient has agreed to participate in recommended program. Yes Note that insurance prior authorization may be required for reimbursement for recommended care.  Comment: Rehab Admissions Coordinator to follow up.  Thanks,  Tara Staggers, MD, Mellody Drown     02/18/2015

## 2015-02-20 NOTE — Progress Notes (Signed)
Pt admitted to unit at 1750 with family at bedside. Reviewed rehab booklet, process, and safety plan with pt and family. Call bell within reach.

## 2015-02-20 NOTE — Progress Notes (Signed)
Discharge orders received. Pt notified that she was going to be discharged to CIR. 4W RN called Risk analyst gave her report on pt. IV removed. Pt and her belongings were transported to [phone] by RN and nurse tech.

## 2015-02-20 NOTE — Progress Notes (Signed)
Speech Language Pathology Treatment: Cognitive-Linquistic  Patient Details Name: Tara Hopkins MRN: [id number] DOB: [birth year] Today's Date: 02/20/2015 Time: 6314-9702 SLP Time Calculation (min) (ACUTE ONLY): 15 min  Assessment / Plan / Recommendation Clinical Impression  Pt seen for cognitive intervention prior to plan for CIR transfer today. Pt  required mild cues to state strategies for memory facilitation and provide examples/hypothetical situations for use. Minimal dysarthria noted with subtle phonemic distortions although speech is intelligible. Recommend continue ST intervention on inpt rehab for executive functions and dysarthria strategies.   HPI HPI: Tara Hopkins is an 64 y.o. female with history of HTN, DM, HLD presenting with acute onset of left sided weakness. Symptoms started at 2130 when she developed left sided weakness LE > UE. Denies any sensory or visual changes. She notes some slurred speech.  CT head imaging reviewed, no signs of acute stroke.  MRI is still pending.     Pertinent Vitals Pain Assessment: No/denies pain  SLP Plan  Continue with current plan of care    Recommendations                General recommendations: Rehab consult Oral Care Recommendations: Oral care BID Plan: Continue with current plan of care    GO     Houston Siren 02/20/2015, 2:54 PM  Orbie Pyo Colvin Caroli.Ed [employer] [phone]

## 2015-02-20 NOTE — Discharge Summary (Addendum)
Stroke Discharge Summary  Patient ID: Tara Hopkins   MRN: [id number]      DOB: February 19, 1951  Date of Admission: 02/15/2015 Date of Discharge: 02/20/2015  Attending Physician:  Rosalin Hawking, MD, Stroke MD  Consulting Physician(s):    rehabilitation medicine  Patient's PCP:  Donia Ast, FNP  Discharge Diagnoses:  Active Problems:   Stroke  BMI  Body mass index is 29.28 kg/(m^2).  Past Medical History  Diagnosis Date  . Diabetes mellitus without complication   . Hypercholesteremia   . Peripheral neuropathy   . Hypertension     not on medications any longer  . Anxiety   . Depression   . History of blood transfusion   . History of hyperbaric oxygen therapy     pt reports 80 treatments.  . Anemia   . Glaucoma    Past Surgical History  Procedure Laterality Date  . I&d extremity Right 05/23/2013    Procedure: IRRIGATION AND DEBRIDEMENT RIGHT GREAT TOE;  Surgeon: Mauri Pole, MD;  Location: [facility];  Service: Orthopedics;  Laterality: Right;  . Amputation Right 12/15/2013    Procedure: Right Transmetatarsal Amputation;  Surgeon: Newt Minion, MD;  Location: [facility];  Service: Orthopedics;  Laterality: Right;  . Amputation Right 02/23/2014    Procedure: AMPUTATION FOOT;  Surgeon: Newt Minion, MD;  Location: [facility];  Service: Orthopedics;  Laterality: Right;  Right Foot Revision Transmetatarsal Amputation    Medications to be continued on Rehab . aspirin  325 mg Oral Daily  . collagenase   Topical Daily  . enoxaparin (LOVENOX) injection  40 mg Subcutaneous Q24H  . gabapentin  600 mg Oral BID  . glimepiride  4 mg Oral QAC breakfast  . insulin aspart  0-24 Units Subcutaneous TID WC  . insulin glargine  30 Units Subcutaneous QHS  . lamoTRIgine  50 mg Oral BID  . latanoprost  1 drop Both Eyes QHS  . linagliptin  5 mg Oral Daily  . lisinopril  20 mg Oral BID  . pantoprazole  40 mg Oral QHS  . simvastatin  20 mg Oral QHS    LABORATORY STUDIES CBC    Component  Value Date/Time   WBC 6.8 02/17/2015 0403   RBC 3.81* 02/17/2015 0403   HGB 10.7* 02/17/2015 0403   HCT 33.5* 02/17/2015 0403   PLT 262 02/17/2015 0403   MCV 87.9 02/17/2015 0403   MCH 28.3 02/17/2015 0403   MCHC 32.2 02/17/2015 0403   RDW 13.7 02/17/2015 0403   LYMPHSABS 2.6 02/17/2015 0403   MONOABS 0.4 02/17/2015 0403   EOSABS 0.2 02/17/2015 0403   BASOSABS 0.0 02/17/2015 0403   CMP    Component Value Date/Time   NA 139 02/17/2015 0403   K 4.0 02/17/2015 0403   CL 107 02/17/2015 0403   CO2 25 02/17/2015 0403   GLUCOSE 163* 02/17/2015 0403   BUN 19 02/17/2015 0403   CREATININE 1.45* 02/17/2015 0403   CALCIUM 8.8 02/17/2015 0403   PROT 5.8* 02/17/2015 0403   ALBUMIN 3.0* 02/17/2015 0403   AST 14 02/17/2015 0403   ALT 16 02/17/2015 0403   ALKPHOS 77 02/17/2015 0403   BILITOT 0.4 02/17/2015 0403   GFRNONAA 38* 02/17/2015 0403   GFRAA 44* 02/17/2015 0403   COAGS Lab Results  Component Value Date   INR 0.99 02/15/2015   INR 1.21 12/15/2013   Lipid Panel    Component Value Date/Time   CHOL 195 02/16/2015 0300  TRIG 341* 02/16/2015 0300   HDL 39* 02/16/2015 0300   CHOLHDL 5.0 02/16/2015 0300   VLDL 68* 02/16/2015 0300   LDLCALC 88 02/16/2015 0300   HgbA1C  Lab Results  Component Value Date   HGBA1C 8.9* 02/16/2015   Cardiac Panel (last 3 results) No results for input(s): CKTOTAL, CKMB, TROPONINI, RELINDX in the last 72 hours. Urinalysis    Component Value Date/Time   COLORURINE YELLOW 02/15/2015 2355   APPEARANCEUR CLOUDY* 02/15/2015 2355   LABSPEC 1.009 02/15/2015 2355   PHURINE 6.0 02/15/2015 2355   GLUCOSEU 100* 02/15/2015 2355   HGBUR SMALL* 02/15/2015 2355   BILIRUBINUR NEGATIVE 02/15/2015 2355   KETONESUR NEGATIVE 02/15/2015 2355   PROTEINUR 100* 02/15/2015 2355   UROBILINOGEN 0.2 02/15/2015 2355   NITRITE NEGATIVE 02/15/2015 2355   LEUKOCYTESUR TRACE* 02/15/2015 2355   Urine Drug Screen     Component Value Date/Time   LABOPIA NONE  DETECTED 02/15/2015 2355   COCAINSCRNUR POSITIVE* 02/15/2015 2355   LABBENZ NONE DETECTED 02/15/2015 2355   AMPHETMU NONE DETECTED 02/15/2015 2355   THCU NONE DETECTED 02/15/2015 2355   LABBARB NONE DETECTED 02/15/2015 2355    Alcohol Level    Component Value Date/Time   ETH <5 02/15/2015 2257     SIGNIFICANT DIAGNOSTIC STUDIES Ct Head Wo Contrast 02/15/2015 1. Mild periventricular white matter changes bilaterally. 2. No acute intracranial abnormality. No evidence for acute infarct.   Ct Angio Neck W/cm &/or Wo/cm 02/18/2015 50% stenosis LEFT internal carotid artery origin. No large vessel occlusion.    MRI HEAD  02/16/2015 Acute nonhemorrhagic infarct extends from the superior posterior aspect of the right lenticular nucleus into the posterior corona radiata. Small acute nonhemorrhagic infarct mid posterior limb of the left internal capsule. Remote right thalamic infarct. Moderate small vessel disease type changes. Mild atrophy. Mild exophthalmos. Mild spinal stenosis C3-4 level.   MRA HEAD  02/16/2015 Intracranial atherosclerotic type changes   Carotid Doppler Right: 1-39% ICA stenosis. Left: 40-59% (high end of range) internal carotid artery stenosis. Bilateral: Vertebral artery flow is antegrade.   2D Echocardiogram  - Left ventricle: The cavity size was normal. Wall thickness wasincreased in a pattern of severe LVH. Systolic function wasnormal. The estimated ejection fraction was in the range of 55%to 60%. Wall motion was normal; there were no regional wallmotion abnormalities. Doppler parameters are consistent withabnormal left ventricular relaxation (grade 1 diastolicdysfunction). - Atrial septum: No defect or patent foramen ovale was identified.  LE Dopplers there is no DVT or SVT noted in the bilateral lower extremities     HISTORY OF PRESENT ILLNES Tara Hopkins is an 64 y.o. female with history of HTN, DM, HLD presenting with acute  onset of left sided weakness. Notes symptoms started at 2130 02/15/2015 when she developed left sided weakness LE > UE. Denies any sensory or visual changes. Notes some slurred speech. Upon EMS arrival noted BP of 220/130, CBG of 224. CT head imaging reviewed, no signs of acute stroke. NIHSS 5. Given labetalol 10mg  IV x 1 with improvement of BP to 167/71. IV tPA administered. She was admitted for further evaluation and treatment.  HOSPITAL COURSE Patient's hospital course was fairly uneventful. She did have elevated blood pressure upon arrival to the emergency department when she arrived she did have acute onset left-sided weakness of arm and leg which has gradually improved during this hospitalization. She was seen by physical therapy and occupational therapy and is felt to be a good candidate for inpatient rehabilitation.  During this  hospitalization her blood pressure continued to be elevated and her lisinopril was increased to 20 mg daily. We resumed her Zocor medication during her hospitalization due to her hypercholesterolemia. She also had a hemoglobin A1c of 8.9 and her diabetes medications were adjusted to help attain a hemoglobin A1c of less than 7. She did receive counseling on quitting cocaine. She did undergo the above stated evaluations including CT scan, MRI, carotid Doppler, echocardiogram and lower extremity Dopplers. The results of these exams are stated above. Once again she continued to improve and will be discharged to rehabilitation facility on 02/20/15. She has been advised to follow-up with Dr.Charleton Deyoung in 2 months.  Stroke: right lenticular nucleus/posterior corona radiata infarct and punctate left internal capsule infarct secondary to small vessel disease source  Resultant Left hemiparesis  MRI See above  MRA Intracranial atherosclerosis, no significant large vessel stenosis  CT angio neck left carotid 50% stenosis  Carotid Doppler left 40-59%, high end, internal carotid  stenosis  2D Echo No source of embolus  Lower extremity Dopplers negative for DVT  Lovenox 40 mg sq daily for VTE prophylaxis  Diet Carb Modified Fluid consistency:: Thin; Room service appropriate?: Yes  no antithrombotic prior to admission, now on aspirin 325 mg orally every day.   Patient counseled to be compliant with her antithrombotic medications  Ongoing aggressive stroke risk factor management  Therapy recommendations: CIR   Disposition: d/c to CIR  Cocaine abuse  Current smoker user  Cocaine cessartion counseling provided  Pt is willing to quit  Hypertension  Home meds: Lisinopril  BP still elevated  Lisinopril increased to 20 mg bid  Add amlodipine 10mg  for better BP control  Patient counseled to be compliant with her blood pressure medications  Hyperlipidemia  Home meds: Zocor 20 resumed in hospital. She had taken Crestor in the past but has not been taking recently  LDL 88, goal < 70  Continue statin at discharge  Diabetes type II, uncontrolled  Home medications: Lantus 30 units q HS, Novolog 4 units tid with meals if CBG's greater than 130 mg/dL, Onglyza 5 mg daily  HgbA1c 8.9, goal < 7.0  NovoLog moderate correction with meals added per recommendations  Other Stroke Risk Factors  Occasional ETOH use  Other Active Problems  Chronic kidney disease stage III  Other Pertinent History  R foot amputation. Neurontin dose decreased per pharmacy recommendation. Restarted Lamictal.    DISCHARGE EXAM Blood pressure 158/78, pulse 87, temperature 97.8 F (36.6 C), temperature source Oral, resp. rate 20, height 5\' 5"  (1.651 m), weight 79.8 kg (175 lb 14.8 oz), SpO2 98 %.  General - Well nourished, well developed, in no apparent distress.  Ophthalmologic - Sharp disc margins OU.  Cardiovascular - Regular rate and rhythm.  Musculoskeletal - right foot partial amputation.  Mental Status -  Level of arousal and orientation to  time, place, and person were intact. Language including expression, naming, repetition, comprehension was assessed and found intact.  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  III, IV, VI - Extraocular movements showed disconjugated eyes. V - Facial sensation intact bilaterally. VII - Facial movement intact bilaterally. VIII - Hearing & vestibular intact bilaterally. X - Palate elevates symmetrically, mild dysarthria. XI - Chin turning & shoulder shrug intact bilaterally. XII - Tongue protrusion intact.  Motor Strength - The patient's strength was LUE 4+/5 and 3/5 left fingers, LLE 4/5 and pronator drift was present on  the left. Bulk was normal and fasciculations were absent.  Motor Tone - Muscle tone was assessed at the neck and appendages and was normal.  Reflexes - The patient's reflexes were 1+ in all extremities and she had no pathological reflexes.  Sensory - Light touch, temperature/pinprick were assessed and were symmetrical.   Coordination - The patient had abnormal movements on the LUE for FTN but not out of proportion of weakness. Tremor was absent.  Gait and Station - deferred due to weakness.Marland Kitchen  Discharge Diet  Diet Carb Modified Fluid consistency:: Thin; Room service appropriate?: Yes thin liquids  DISCHARGE PLAN  Disposition:  Transfer to [facility] for ongoing PT, OT and ST  aspirin 325 mg orally every day for secondary stroke prevention.  Recommend ongoing risk factor control by Primary Care Physician at time of discharge from inpatient rehabilitation.  Follow-up CAMPBELL, PADONDA BOYD, FNP in 2 weeks following discharge from rehab.  Follow-up with Dr. Rosalin Hawking, Stroke Clinic in 2 months.   50 minutes were spent preparing discharge.  Patient seen and discussed with Dr. Lerry Liner, PA-C [facility]  I, the attending vascular neurologist, have personally  obtained a history, examined the patient, evaluated laboratory data, individually viewed imaging studies and agree with radiology interpretations. Together with the NP/PA, we formulated the assessment and plan of care which reflects our mutual decision. I have made any additions or clarifications directly to the above note and agree with the findings and plan as currently documented.   64 yo F with hx of DM, HTN, HLD, cocaine use admitted for acute right corona radiata stroke. MRI also showed left internal capsule punctate infarct. Due to significant stroke risk factors with uncontrolled HTN, A1C 8.9 and cocaine use, her stroke most likely synchronized small vessel events. Will continue ASA 325, as well as statin and aggressive PT/OT. BP still high, increased lisinopril dose and start amlodipine. Glucose stable. D/c to CIR.  Rosalin Hawking, MD PhD Stroke Neurology 02/20/2015 4:24 PM

## 2015-02-20 NOTE — Progress Notes (Signed)
Inpatient Rehabilitation  I await a response from the insurance company for admission to CIR, pending bed availability and medical clearance.  I will update the acute team when I get notification from insurance company.  Please call if questions.  [facility] Admissions Coordinator Cell [phone] Office [phone]

## 2015-02-20 NOTE — Progress Notes (Signed)
Inpatient Rehabilitation  I received insurance authorization for pt. to admit to [facility] Rehab today. Pt. Is quite pleased.   I discussed with Dr. [name] and he stated that pt. Is medically ready .  I have notified Lorne Skeens, CM and Dysheeka Bibbs, SW.  Pt's RN Manus Gunning is also made aware.  I will arrange for admission later today.  Please call if questions.  [facility] Admissions Coordinator Cell [phone] Office [phone]

## 2015-02-21 ENCOUNTER — Inpatient Hospital Stay (HOSPITAL_COMMUNITY): Payer: Self-pay | Admitting: Physical Therapy

## 2015-02-21 ENCOUNTER — Inpatient Hospital Stay (HOSPITAL_COMMUNITY): Payer: BC Managed Care – PPO | Admitting: Occupational Therapy

## 2015-02-21 DIAGNOSIS — Z89431 Acquired absence of right foot: Secondary | ICD-10-CM

## 2015-02-21 LAB — CBC WITH DIFFERENTIAL/PLATELET
BASOS ABS: 0 10*3/uL (ref 0.0–0.1)
BASOS PCT: 1 % (ref 0–1)
EOS ABS: 0.2 10*3/uL (ref 0.0–0.7)
Eosinophils Relative: 2 % (ref 0–5)
HEMATOCRIT: 34.3 % — AB (ref 36.0–46.0)
Hemoglobin: 11 g/dL — ABNORMAL LOW (ref 12.0–15.0)
LYMPHS ABS: 2.8 10*3/uL (ref 0.7–4.0)
LYMPHS PCT: 37 % (ref 12–46)
MCH: 28.7 pg (ref 26.0–34.0)
MCHC: 32.1 g/dL (ref 30.0–36.0)
MCV: 89.6 fL (ref 78.0–100.0)
MONOS PCT: 5 % (ref 3–12)
Monocytes Absolute: 0.4 10*3/uL (ref 0.1–1.0)
NEUTROS ABS: 4.1 10*3/uL (ref 1.7–7.7)
NEUTROS PCT: 55 % (ref 43–77)
Platelets: 294 10*3/uL (ref 150–400)
RBC: 3.83 MIL/uL — AB (ref 3.87–5.11)
RDW: 13.9 % (ref 11.5–15.5)
WBC: 7.5 10*3/uL (ref 4.0–10.5)

## 2015-02-21 LAB — COMPREHENSIVE METABOLIC PANEL
ALBUMIN: 3.4 g/dL — AB (ref 3.5–5.2)
ALK PHOS: 75 U/L (ref 39–117)
ALT: 24 U/L (ref 0–35)
AST: 22 U/L (ref 0–37)
Anion gap: 9 (ref 5–15)
BILIRUBIN TOTAL: 0.5 mg/dL (ref 0.3–1.2)
BUN: 23 mg/dL (ref 6–23)
CO2: 25 mmol/L (ref 19–32)
Calcium: 9.3 mg/dL (ref 8.4–10.5)
Chloride: 107 mmol/L (ref 96–112)
Creatinine, Ser: 1.51 mg/dL — ABNORMAL HIGH (ref 0.50–1.10)
GFR calc Af Amer: 42 mL/min — ABNORMAL LOW (ref >90)
GFR, EST NON AFRICAN AMERICAN: 36 mL/min — AB (ref >90)
Glucose, Bld: 95 mg/dL (ref 70–99)
Potassium: 4.4 mmol/L (ref 3.5–5.1)
SODIUM: 141 mmol/L (ref 135–145)
Total Protein: 6.4 g/dL (ref 6.0–8.3)

## 2015-02-21 LAB — GLUCOSE, CAPILLARY
GLUCOSE-CAPILLARY: 96 mg/dL (ref 70–99)
GLUCOSE-CAPILLARY: 240 mg/dL — AB (ref 70–99)
Glucose-Capillary: 173 mg/dL — ABNORMAL HIGH (ref 70–99)
Glucose-Capillary: 259 mg/dL — ABNORMAL HIGH (ref 70–99)

## 2015-02-21 NOTE — Progress Notes (Signed)
Physical Therapy Assessment and Plan  Patient Details  Name: Tara Hopkins MRN: [id number] Date of Birth: 01/17/1951  PT Diagnosis: Abnormal posture, Abnormality of gait, Hemiparesis non-dominant and Muscle weakness Rehab Potential: Good ELOS: 14-18   Today's Date: 02/21/2015 PT Individual Time: 1030-1200 PT Individual Time Calculation (min): 90 min    Problem List:  Patient Active Problem List   Diagnosis Date Noted  . CVA (cerebral infarction) 02/20/2015  . Left hemiparesis 02/20/2015  . Cocaine abuse   . Stroke 02/15/2015  . 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 Osteomyelitis of foot, right, acute 02/23/2014  . 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 Diabetic foot infection with ulcer and abcess, right 05/21/2013  . DM type 2 with diabetic peripheral neuropathy 05/21/2013  . Benign hypertension 05/21/2013  . Depression 05/21/2013  . Hypokalemia 05/21/2013  . Hyponatremia 05/21/2013  . Anemia 05/21/2013  . Renal insufficiency 05/21/2013    Past Medical History:  Past Medical History  Diagnosis Date  . Diabetes mellitus without complication   . Hypercholesteremia   . Peripheral neuropathy   . Hypertension     not on medications any longer  . Anxiety   . Depression   . History of blood transfusion   . History of hyperbaric oxygen therapy     pt reports 80 treatments.  . Anemia   . Glaucoma    Past Surgical History:  Past Surgical History  Procedure Laterality Date  . I&d extremity Right 05/23/2013    Procedure: IRRIGATION AND DEBRIDEMENT RIGHT GREAT TOE;  Surgeon: Mauri Pole, MD;  Location: [facility];  Service: Orthopedics;  Laterality: Right;  . Amputation Right 12/15/2013    Procedure: Right Transmetatarsal Amputation;  Surgeon: Newt Minion, MD;  Location: [facility];  Service: Orthopedics;  Laterality: Right;  . Amputation Right 02/23/2014    Procedure: AMPUTATION FOOT;  Surgeon: Newt Minion, MD;  Location: [facility];  Service: Orthopedics;  Laterality: Right;  Right Foot Revision Transmetatarsal Amputation    Assessment & Plan Clinical Impression: Tara Hopkins is a 64 y.o. RH-female with history of HTN, DM, peripheral neuropathy, anxiety disorder; who was admitted via EMS on 02/15/15 with acute onset of left sided weakness and slurred speech. Upon EMS evaluation BP 220/130 and CBG of 224. She was given labetalol $RemoveBeforeD'10mg'[id number]$  IV x 1 with improvement of BP to 167/71. UDS positive for cocaine. CT head negative and tPA administered. MRI/MRA brain was done revealing acute nonhemorrhagic infarcts in posterior aspect of right lenticular nucleus to posterior corona radiata and posterior limb of L-IC. 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 2D echo showed severe LVH, EF 55-60% and no regional wall abnormalities. BLE dopplers negative for DVT. Speech therapy evaluation reveals mild cognitive deficits in executive functioning and recall. 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 Patient with resultant left hemiparesis superimposed on chronic gait disorder and CIR was recommended by MD and rehab team.   Patient currently requires mod with mobility secondary to muscle weakness and impaired timing and sequencing, unbalanced muscle activation, motor apraxia, decreased coordination and decreased motor planning.  Prior to hospitalization, patient was independent  with mobility and lived with Alone in a [place] home.  Home access is 3 steps into god daughter's house, reports no rails but has "poles"Stairs to enter, Ramped entrance (Making some adjustments to build ramp, and there is also a ramp in the back).  Patient  will benefit from skilled PT intervention to maximize safe functional mobility and minimize fall risk for planned discharge home with 24 hour supervision.  Anticipate patient will benefit from follow up OP at discharge.  PT - End of Session Endurance  Deficit: No PT Assessment Rehab Potential (ACUTE/IP ONLY): Good Barriers to Discharge: Decreased caregiver support PT Patient demonstrates impairments in the following area(s): Balance;Motor;Perception;Safety;Sensory;Skin Integrity PT Transfers Functional Problem(s): Bed Mobility;Bed to Chair;Car;Furniture;Floor PT Locomotion Functional Problem(s): Ambulation;Wheelchair Mobility;Stairs PT Plan PT Intensity: Minimum of 1-2 x/day ,45 to 90 minutes PT Frequency: 5 out of 7 days PT Duration Estimated Length of Stay: 14-18 PT Treatment/Interventions: Ambulation/gait training;Balance/vestibular training;Community reintegration;Discharge planning;Disease management/prevention;DME/adaptive equipment instruction;Functional mobility training;Neuromuscular re-education;Pain management;Patient/family education;Skin care/wound management;Splinting/orthotics;Stair training;Therapeutic Activities;Therapeutic Exercise;UE/LE Strength taining/ROM;UE/LE Coordination activities;Visual/perceptual remediation/compensation;Wheelchair propulsion/positioning PT Transfers Anticipated Outcome(s): Supervision PT Locomotion Anticipated Outcome(s): Min A ambulatory level; Supervision w/c level PT Recommendation Recommendations for Other Services: Neuropsych consult Follow Up Recommendations: Outpatient PT;Home health PT;24 hour supervision/assistance Patient destination: Home Equipment Recommended: To be determined  Skilled Therapeutic Intervention PT evaluation performed. See below for detailed findings. Treatment initiated. Session focused on bed mobility, functional transfers, w/c propulsion, and gait. See below for detailed description of assist/cueing required with the functional mobility mentioned above. Pt was emotional throughout the session and required mod coaxing to complete activities. Educated pt on findings, goals, and plan of care. Oriented pt to rehab unit, fall precautions. Pt will likely require  reinforcement. Son present at end of session.   PT Evaluation Precautions/Restrictions Precautions Precautions: Fall Precaution Comments: Rt mid foot amputation Restrictions Weight Bearing Restrictions: No General   Vital SignsTherapy Vitals Temp: 98.2 F (36.8 C) Temp Source: Oral Pulse Rate: 76 Resp: 18 BP: (!) 159/64 mmHg Patient Position (if appropriate): Lying Oxygen Therapy SpO2: 98 % O2 Device: Not Delivered Pain Pain Assessment Pain Score: Asleep Home Living/Prior Functioning Home Living Available Help at Discharge: Family;Available 24 hours/day Type of Home: Apartment Home Access: Stairs to enter;Ramped entrance (Making some adjustments to build ramp, and there is also a ramp in the back) Entrance Stairs-Number of Steps: 3 steps into god daughter's house, reports no rails but has "poles" Entrance Stairs-Rails: None Home Layout: One level Additional Comments: Pt reports plan is to d/c to god daughter, Tonya's home.  When son gets out of jail in June, he will be her caretaker.  Also reports that her apt is being made handicap accessible with elevated toilet seat, grab bars in shower, and ramped entry  Lives With: Alone Prior Function Level of Independence: Independent with basic ADLs;Independent with homemaking with ambulation;Requires assistive device for independence (Used tripod cane 'as much as she could')  Able to Take Stairs?: Yes Driving: Yes (Pt has been practicing driving since ambutation) Vocation: Retired Leisure: Hobbies-yes (Comment) Comments: Pt likes to watch TV, coloring books, water color, puzzels Cognition Overall Cognitive Status: Within Functional Limits for tasks assessed Arousal/Alertness: Awake/alert Orientation Level: Oriented X4 Safety/Judgment: Impaired Sensation Sensation Light Touch: Appears Intact Proprioception: Appears Intact Additional Comments: Sensation appeared intact on eval, but pt has a history of B LE  neuropathy. Coordination Heel Shin Test:  Movement on R is slower than L Motor  Motor Motor: Other (comment) (L Hemiparesis)  Mobility Bed Mobility Bed Mobility: Rolling Right;Supine to Sit;Sit to Supine;Scooting to [facility] Rolling Right: 5: Supervision Supine to Sit: 5: Supervision;HOB flat Sit to Supine: 5: Supervision;HOB flat Scooting to HOB: 5: Supervision Transfers Transfers: Yes Sit to Stand: 3: Mod assist;With upper extremity assist Sit to Stand Details:  Verbal cues for technique;Manual facilitation for weight shifting Stand to Sit: 3: Mod assist;With upper extremity assist;With armrests;To bed Stand Pivot Transfers: 3: Mod assist;1: +2 Total assist (+2 for safety) Stand Pivot Transfer Details: Verbal cues for technique;Tactile cues for posture;Tactile cues for weight shifting Squat Pivot Transfers: 2: Max Risk manager Details: Tactile cues for weight shifting;Verbal cues for sequencing;Verbal cues for technique;Manual facilitation for weight shifting Locomotion  Ambulation Ambulation: Yes Ambulation/Gait Assistance: 3: Mod assist (HHA) Assistive device: 1 person hand held assist;Other (Comment) (R hallway railing) Ambulation/Gait Assistance Details: Verbal cues for technique;Verbal cues for precautions/safety;Tactile cues for posture Gait Gait: Yes Gait Pattern: Impaired Gait Pattern: Step-to pattern;Decreased step length - left;Decreased stance time - right;Decreased weight shift to right;Trunk flexed;Narrow base of support;Left flexed knee in stance Stairs / Additional Locomotion Stairs: No Wheelchair Mobility Wheelchair Mobility: Yes Wheelchair Assistance: 3: Mod assist Wheelchair Assistance Details: Tactile cues for initiation;Tactile cues for sequencing;Tactile cues for placement;Verbal cues for sequencing;Verbal cues for technique;Verbal cues for safe use of DME/AE;Manual facilitation for placement Wheelchair Propulsion: Both upper extremities;Left  upper extremity;Left lower extremity (Started with B UEs, moved to R hemi technique) Wheelchair Parts Management: Needs assistance  Trunk/Postural Assessment  Cervical Assessment Cervical Assessment: Within Functional Limits Thoracic Assessment Thoracic Assessment: Within Functional Limits Lumbar Assessment Lumbar Assessment: Within Functional Limits Postural Control Postural Control: Deficits on evaluation Postural Limitations: Pt demonstrated lateral lean R and posterior pelvic tilt in sitting  Balance Balance Balance Assessed: Yes Static Sitting Balance Static Sitting - Balance Support: No upper extremity supported;Feet supported Static Sitting - Level of Assistance: 5: Stand by assistance Dynamic Sitting Balance Dynamic Sitting - Balance Support: Right upper extremity supported;Feet supported;During functional activity (Putting shoes on) Dynamic Sitting - Level of Assistance: 5: Stand by assistance Static Standing Balance Static Standing - Balance Support: During functional activity Static Standing - Level of Assistance: 3: Mod assist Dynamic Standing Balance Dynamic Standing - Balance Support: Bilateral upper extremity supported Dynamic Standing - Level of Assistance: 4: Min assist;3: Mod assist Extremity Assessment  RLE Assessment RLE Assessment: Within Functional Limits LLE Assessment LLE Assessment: Exceptions to [facility] LLE Strength LLE Overall Strength: Deficits LLE Overall Strength Comments: Pt demonstrated strength limitations in L hip flex, ankle dorsiflexion and planterflexion (3-/5). FIM:  FIM - Locomotion: Ambulation Ambulation/Gait Assistance: 3: Mod assist (HHA)   Refer to Care Plan for Long Term Goals  Recommendations for other services: Neuropsych  Discharge Criteria: Patient will be discharged from PT if patient refuses treatment 3 consecutive times without medical reason, if treatment goals not met, if there is a change in medical status, if patient makes  no progress towards goals or if patient is discharged from hospital.  The above assessment, treatment plan, treatment alternatives and goals were discussed and mutually agreed upon: by patient  Dean Goldner 02/22/2015, 8:14 AM

## 2015-02-21 NOTE — Care Management Note (Signed)
[facility] Individual Statement of Services  Patient Name:  Tara Hopkins  Date:  02/21/2015  Welcome to the [facility].  Our goal is to provide you with an individualized program based on your diagnosis and situation, designed to meet your specific needs.  With this comprehensive rehabilitation program, you will be expected to participate in at least 3 hours of rehabilitation therapies Monday-Friday, with modified therapy programming on the weekends.  Your rehabilitation program will include the following services:  Physical Therapy (PT), Occupational Therapy (OT), Speech Therapy (ST), 24 hour per day rehabilitation nursing, Therapeutic Recreaction (TR), Neuropsychology, Case Management (Social Worker), Rehabilitation Medicine, Nutrition Services and Pharmacy Services  Weekly team conferences will be held on Wednesday to discuss your progress.  Your Social Worker will talk with you frequently to get your input and to update you on team discussions.  Team conferences with you and your family in attendance may also be held.  Expected length of stay: 14-18 days Overall anticipated outcome: supervision/mod/i level  Depending on your progress and recovery, your program may change. Your Social Worker will coordinate services and will keep you informed of any changes. Your Social Worker's name and contact numbers are listed  below.  The following services may also be recommended but are not provided by the [facility]:    [place] will be made to provide these services after discharge if needed.  Arrangements include referral to agencies that provide these services.  Your insurance has been verified to be:  [employer] Your primary doctor is:  Roxy Cedar  Pertinent information will be shared with your doctor and your insurance company.  Social  Worker:  Ovidio Kin, [place] or ([phone]  Information discussed with and copy given to patient by: Elease Hashimoto, 02/21/2015, 3:52 PM

## 2015-02-21 NOTE — Progress Notes (Signed)
Physical Therapy Session Note  Patient Details  Name: Tara Hopkins MRN: [id number] Date of Birth: 1951/06/16  Today's Date: 02/21/2015 PT Individual Time: 1500-1600 PT Individual Time Calculation (min): 60 min   Short Term Goals: Week 1:  PT Short Term Goal 1 (Week 1): Pt will perform bed mobility mod I.  PT Short Term Goal 2 (Week 1): Pt will perform bed<>w/c transfer with min A and 25% cues from PT.  PT Short Term Goal 3 (Week 1): Pt will propel w/c 100' with B UEs mod A and 50% cues from PT.  PT Short Term Goal 4 (Week 1): Pt will ambulate 24' with mod A HHA with 50% cues from PT  Skilled Therapeutic Interventions/Progress Updates:  Pt received seated in recliner, ready for therapy. Session focused on gait training. Transported pt to gym with Total A for energy conservation. In gym, pt negotiated Pt negotiated 8 stairs, ascending forwards and descending backwards with step-to pattern and max A,manual advancement of LLE when descending. Pt able to initiate (but not complete) LLE advancement/placement on step during ascent. Pt required manual advancement of LUE on rail throughout trial. Donned ACE bandage at LLE for dorsiflexion assist, then negotiated 8 (4") stairs (gait pattern, cueing for sequencing, advancement of LUE as described above) with min A to ascend and mod A for majority of descent; however, pt required +2A for safety while descending final 3 stairs due to onset of L knee buckling.   Following seated rest break, pt performed gait in controlled environment x25' with R hallway hand rail and Min A then x15' with hemi walker and Mod A. Manual approximation provided at L knee for stability (to prevent buckling); verbal cueing focused on increased RLE step to increase pt initiation of RLE advancement. Returned to pt room via w/c propulsion x140' with bilat LE's for LLE activation (x120' retro propulsion with bilat LE's for quadriceps activation; x20' forward propulsion with LLE only for  hamstring activation) with max multimodal cueing for LLE attention, weight bearing, and technique. Departed pt room with pt seated in recliner with all needs within reach.  Therapy Documentation Precautions:  Precautions Precautions: Fall Precaution Comments: Rt mid foot amputation Restrictions Weight Bearing Restrictions: No Pain: Pain Assessment Pain Assessment: No/denies pain Locomotion : Ambulation Ambulation: Yes Ambulation/Gait Assistance: 3: Mod assist (HHA) Ambulation Distance (Feet): 40 Feet Assistive device: 1 person hand held assist;Other (Comment) (R hallway railing) Ambulation/Gait Assistance Details: Verbal cues for technique;Verbal cues for precautions/safety;Tactile cues for posture Gait Gait: Yes Gait Pattern: Impaired Gait Pattern: Step-to pattern;Decreased step length - left;Decreased stance time - right;Decreased weight shift to right;Trunk flexed;Narrow base of support Stairs / Additional Locomotion Stairs: Yes Stairs Assistance: 2: Max assist Stairs Assistance Details: Manual facilitation for placement;Verbal cues for sequencing;Tactile cues for sequencing;Tactile cues for weight shifting Stairs Assistance Details (indicate cue type and reason): Pt negotiated 8 stairs, ascending forwards and descending backwards with step-to pattern and max A,manual advancement of LLE when descending. Pt able to initiate (but not complete) LLE advancement/placement on step during ascent. Pt required manual advancement of LUE on rail throughout trial. Stair Management Technique: Forwards;Backwards;Two rails;Step to pattern Number of Stairs: 8 Height of Stairs: 4 Wheelchair Mobility Wheelchair Mobility: Yes Wheelchair Assistance: 3: Building surveyor Details: Tactile cues for initiation;Tactile cues for sequencing;Tactile cues for placement;Verbal cues for sequencing;Verbal cues for technique;Verbal cues for safe use of DME/AE;Manual facilitation for  placement Wheelchair Propulsion: Both upper extremities;Left upper extremity;Left lower extremity (Started with B UEs, moved to  R hemi technique) Wheelchair Parts Management: Needs assistance Distance: 40   See FIM for current functional status  Therapy/Group: Individual Therapy  Malini Flemings, Malva Cogan 02/21/2015, 3:21 PM

## 2015-02-21 NOTE — Progress Notes (Signed)
Patient information reviewed and entered into eRehab system by Brewster Wolters, RN, CRRN, PPS Coordinator.  Information including medical coding and functional independence measure will be reviewed and updated through discharge.    

## 2015-02-21 NOTE — Progress Notes (Signed)
64 y.o. RH-female with history of HTN, DM, peripheral neuropathy, anxiety disorder; who was admitted via EMS on 02/15/15 with acute onset of left sided weakness and slurred speech. Upon EMS evaluation BP 220/130 and CBG of 224. She was given labetalol 79m IV x 1 with improvement of BP to 167/71. UDS positive for cocaine. CT head negative and tPA administered. MRI/MRA brain was done revealing acute nonhemorrhagic infarcts in posterior aspect of right lenticular nucleus to posterior corona radiata and posterior limb of L-IC. 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 2D echo showed severe LVH, EF 55-60% and no regional wall abnormalities. BLE dopplers negative for DVT. Speech therapy evaluation reveals mild cognitive deficits in executive functioning and recall. 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 Subjective/Complaints: Had 5 foot surgeries eventual transmet amp Dr DSharol Given hx osteomyelitis  ROS - weakness Left side Objective: Vital Signs: Blood pressure 148/61, pulse 61, temperature 98.3 F (36.8 C), temperature source Oral, resp. rate 17, SpO2 95 %. No results found. Results for orders placed or performed during the hospital encounter of 02/20/15 (from the past 72 hour(s))  Glucose, capillary     Status: Abnormal   Collection Time: 02/20/15  8:47 PM  Result Value Ref Range   Glucose-Capillary 150 (H) 70 - 99 mg/dL   Comment 1 Notify RN   Glucose, capillary     Status: None   Collection Time: 02/21/15  6:37 AM  Result Value Ref Range   Glucose-Capillary 96 70 - 99 mg/dL   Comment 1 Notify RN   CBC WITH DIFFERENTIAL     Status: Abnormal   Collection Time: 02/21/15  6:57 AM  Result Value Ref Range   WBC 7.5 4.0 - 10.5 K/uL   RBC 3.83 (L) 3.87 - 5.11 MIL/uL   Hemoglobin 11.0 (L) 12.0 - 15.0 g/dL   HCT 34.3 (L) 36.0 - 46.0 %   MCV 89.6 78.0 - 100.0 fL   MCH 28.7 26.0 - 34.0 pg   MCHC 32.1 30.0 - 36.0 g/dL   RDW 13.9 11.5 -  15.5 %   Platelets 294 150 - 400 K/uL   Neutrophils Relative % 55 43 - 77 %   Neutro Abs 4.1 1.7 - 7.7 K/uL   Lymphocytes Relative 37 12 - 46 %   Lymphs Abs 2.8 0.7 - 4.0 K/uL   Monocytes Relative 5 3 - 12 %   Monocytes Absolute 0.4 0.1 - 1.0 K/uL   Eosinophils Relative 2 0 - 5 %   Eosinophils Absolute 0.2 0.0 - 0.7 K/uL   Basophils Relative 1 0 - 1 %   Basophils Absolute 0.0 0.0 - 0.1 K/uL  Comprehensive metabolic panel     Status: Abnormal   Collection Time: 02/21/15  6:57 AM  Result Value Ref Range   Sodium 141 135 - 145 mmol/L   Potassium 4.4 3.5 - 5.1 mmol/L   Chloride 107 96 - 112 mmol/L   CO2 25 19 - 32 mmol/L   Glucose, Bld 95 70 - 99 mg/dL   BUN 23 6 - 23 mg/dL   Creatinine, Ser 1.51 (H) 0.50 - 1.10 mg/dL   Calcium 9.3 8.4 - 10.5 mg/dL   Total Protein 6.4 6.0 - 8.3 g/dL   Albumin 3.4 (L) 3.5 - 5.2 g/dL   AST 22 0 - 37 U/L   ALT 24 0 - 35 U/L   Alkaline Phosphatase 75 39 - 117 U/L   Total  Bilirubin 0.5 0.3 - 1.2 mg/dL   GFR calc non Af Amer 36 (L) >90 mL/min   GFR calc Af Amer 42 (L) >90 mL/min    Comment: (NOTE) The eGFR has been calculated using the CKD EPI equation. This calculation has not been validated in all clinical situations. eGFR's persistently <90 mL/min signify possible Chronic Kidney Disease.    Anion gap 9 5 - 15     HEENT: poor dentition Cardio: RRR and no murmur Resp: CTA B/L and unlabored GI: BS positive and NT, ND Extremity:  Pulses positive and No Edema Skin:   Wound Right post leg 1.5cm gransulating no odor Neuro: Alert/Oriented, Cranial Nerve II-XII normal, Normal Sensory, Abnormal Motor 3-/5 R delt bi tri grip, 3+ L HF , KE ankle DF, R 5/5 except Right ankle limited by pertial foot amp and Abnormal FMC Ataxic/ dec FMC Musc/Skel:  Other Right trans met amp Gen NAD   Assessment/Plan: 1. Functional deficits secondary to right lenticular nucleus/corona radiate thrombotic infarct  which require 3+ hours per day of interdisciplinary  therapy in a comprehensive inpatient rehab setting. Physiatrist is providing close team supervision and 24 hour management of active medical problems listed below. Physiatrist and rehab team continue to assess barriers to discharge/monitor patient progress toward functional and medical goals. FIM:                                  Medical Problem List and Plan: 1. Functional deficits secondary to hypertensive right lenticular nucleus/corona radiate infarct 2. DVT Prophylaxis/Anticoagulation: Pharmaceutical: Lovenox 3. Pain Management: neurontin for peripheral neuropathy.  4. Bipolar disorder/Mood: Appears stable. Continue Lamictal bid. Klonopin as needed prn anxiety.  5. Neuropsych: This patient is capable of making decisions on her own behalf. 6. Skin/Wound Care: Continue santyl to ulcer on right shin. Routine pressure relief measures.  7. Fluids/Electrolytes/Nutrition: Monitor I/O. Check follow up lytes in am.  8. DM type 2 with peripheral neuropathy : Monitor BS with ac/hs checks. Continue Trajenta, amaryl to bid and lantus with SSI for elevated BS.  9. CKD stage III: Monitor for stability. Labs stable.  10. Dyslipidemia: On Zocor. 11. HTN: Monitor BP every 8 hours. Continue norvasc and lisinopril.  12.  S/P  Right partial foot amp LOS (Days) 1 A FACE TO FACE EVALUATION WAS PERFORMED  Arya Boxley E 02/21/2015, 8:04 AM

## 2015-02-21 NOTE — Progress Notes (Signed)
Social Work Assessment and Plan Social Work Assessment and Plan  Patient Details  Name: Tara Hopkins MRN: [id number] Date of Birth: Oct 20, 1951  Today's Date: 02/21/2015  Problem List:  Patient Active Problem List   Diagnosis Date Noted  . CVA (cerebral infarction) 02/20/2015  . Left hemiparesis 02/20/2015  . Cocaine abuse   . Stroke 02/15/2015  . 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 Osteomyelitis of foot, right, acute 02/23/2014  . 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 Diabetic foot infection with ulcer and abcess, right 05/21/2013  . DM type 2 with diabetic peripheral neuropathy 05/21/2013  . Benign hypertension 05/21/2013  . Depression 05/21/2013  . Hypokalemia 05/21/2013  . Hyponatremia 05/21/2013  . Anemia 05/21/2013  . Renal insufficiency 05/21/2013   Past Medical History:  Past Medical History  Diagnosis Date  . Diabetes mellitus without complication   . Hypercholesteremia   . Peripheral neuropathy   . Hypertension     not on medications any longer  . Anxiety   . Depression   . History of blood transfusion   . History of hyperbaric oxygen therapy     pt reports 80 treatments.  . Anemia   . Glaucoma    Past Surgical History:  Past Surgical History  Procedure Laterality Date  . I&d extremity Right 05/23/2013    Procedure: IRRIGATION AND DEBRIDEMENT RIGHT GREAT TOE;  Surgeon: Mauri Pole, MD;  Location: [facility];  Service: Orthopedics;  Laterality: Right;  . Amputation Right 12/15/2013    Procedure: Right Transmetatarsal Amputation;  Surgeon: Newt Minion, MD;  Location: [place];  Service: Orthopedics;  Laterality: Right;  . Amputation Right 02/23/2014    Procedure: AMPUTATION FOOT;  Surgeon: Newt Minion, MD;  Location: [place];  Service: Orthopedics;  Laterality: Right;  Right Foot Revision Transmetatarsal Amputation   Social History:  reports that she has been smoking Cigarettes.  She has a .2 pack-year  smoking history. She has never used smokeless tobacco. She reports that she drinks alcohol. She reports that she does not use illicit drugs.  Family / Support Systems Marital Status: Separated How Long?: 9-10 months Patient Roles: Parent Children: Joi-daughter [phone] Other Supports: Kenney Houseman Robinson-god daughter  [phone] Anticipated Caregiver: Tonya and pt's eldest, along wiht Joi-all of them togehter will make sure she has 24 hr care Ability/Limitations of Caregiver: No limitations-Tonya doesn;t work outside the home Caregiver Availability: 24/7 Family Dynamics: Close knit wiht ehr three children-son brought pt lunch today.  She is very involved with her family and grandchildren.  She has numerous extended family and friends who are supportive and will make sure she has what she needs.  Social History Preferred language: English Religion: Baptist Cultural Background: No issues Education: Secretary/administrator educated Read: Yes Write: Yes Employment Status: Disabled Date Retired/Disabled/Unemployed: Been denied four times has a Chief Executive Officer now to get her disability Freight forwarder Issues: Pt is legally separated and not living with her spouse, she has a Chief Executive Officer working on her disability appeal Guardian/Conservator: None-according to MD pt is capable of making her own decisions while here.   Abuse/Neglect Physical Abuse: Denies Verbal Abuse: Denies Sexual Abuse: Denies Exploitation of patient/patient's resources: Denies Self-Neglect: Denies  Emotional Status Pt's affect, behavior adn adjustment status: Pt is motivated to improve and is encouraged by the progress she has made already.  She is one that has always taken care of herself and she wants to do this again.  She is challenged at times  with her foot but will overcome any obstacle.  She is positive and ready to work in therapies. Recent Psychosocial Issues: relationship issues, worried about son, disability issues,  etc Pyschiatric History: Had been feeling depressed and this is the reason she used cocaine but realizes this was not the answer.  She would benefit from neuro-psych to see while here.  She has much going on and would benefit from talking to them.  SW will also see daily to provide supprotive counseling. Substance Abuse History: Was positive for cocaine on admission-pt plans to quit this and is aware of the resources available for this. She did not use but a few times.  She wants to be as healthy as she can be and prevent another stroke.  She is aware of the dangers of smokng tobacco also.  Patient / Family Perceptions, Expectations & Goals Pt/Family understanding of illness & functional limitations: Pt and daughter can explain her stroke and deficits.  Her daughter is currently in RN school who visits daily and asks many medical questiosn for which MD has addressed.  Pt just wants to improve and become independent again. Premorbid pt/family roles/activities: Mother, Godmother, retiree, chruch member, sister, aunt, etc Anticipated changes in roles/activities/participation: resume Pt/family expectations/goals: Pt states: " I want to do for myself or as close as I can do."  Daughter states: " I hope she will be able to recover from this but we will do what is needed for her."  [employer]: Other (Comment) (Appealing Disability denial-has lawyer) Premorbid Home Care/DME Agencies: Other (Comment) (Had in past) Transportation available at discharge: Family and friends-pt did not drive before this  Resource referrals recommended: Neuropsychology, Support group (specify)  Discharge Planning Living Arrangements: Alone Support Systems: Children, Other relatives, Friends/neighbors, Church/faith community Type of Residence: Private residence Insurance Resources: Multimedia programmer (specify) Printmaker) Financial Resources: Other (Comment) (pension from [employer]) Financial Screen  Referred: Yes Living Expenses: Rent Money Management: Patient Does the patient have any problems obtaining your medications?: Yes (Describe) (Difficulty with a few of her meds which are very expensive) Home Management: Patient Patient/Family Preliminary Plans: Plan now is to go to [place] home where she can have 24 hr supervision.  Pt hopes to go home back to her home once she gets back to independent level. She does not live with her ex-husband like reported in the chart.  Between all of her family she will have 24 hr care. Social Work Anticipated Follow Up Needs: HH/OP, Support Group  Clinical Impression Pleasant female who is willing to work in therapies and glad to be her [name].  She wants to get back to her apartment but is willing to go to [facility] home until then. She has many stressors in her life with separation from husband, finances-appealing disability and now this stroke.  Will benefit from neuro-psych while here and possibility an anti-depressant. Will work on discharge plans and will provide daily support.   Elease Hashimoto 02/21/2015, 3:49 PM

## 2015-02-21 NOTE — Evaluation (Signed)
Occupational Therapy Assessment and Plan  Patient Details  Name: Tara Hopkins MRN: [id number] Date of Birth: 1951-06-10  OT Diagnosis: hemiplegia affecting non-dominant side and muscle weakness (generalized) Rehab Potential: Rehab Potential (ACUTE ONLY): Good ELOS: 14-18 days   Today's Date: 02/21/2015 OT Individual Time: 0263-7858 OT Individual Time Calculation (min): 64 min     Problem List:  Patient Active Problem List   Diagnosis Date Noted  . CVA (cerebral infarction) 02/20/2015  . Left hemiparesis 02/20/2015  . Cocaine abuse   . Stroke 02/15/2015  . 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 Osteomyelitis of foot, right, acute 02/23/2014  . 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 Diabetic foot infection with ulcer and abcess, right 05/21/2013  . DM type 2 with diabetic peripheral neuropathy 05/21/2013  . Benign hypertension 05/21/2013  . Depression 05/21/2013  . Hypokalemia 05/21/2013  . Hyponatremia 05/21/2013  . Anemia 05/21/2013  . Renal insufficiency 05/21/2013    Past Medical History:  Past Medical History  Diagnosis Date  . Diabetes mellitus without complication   . Hypercholesteremia   . Peripheral neuropathy   . Hypertension     not on medications any longer  . Anxiety   . Depression   . History of blood transfusion   . History of hyperbaric oxygen therapy     pt reports 80 treatments.  . Anemia   . Glaucoma    Past Surgical History:  Past Surgical History  Procedure Laterality Date  . I&d extremity Right 05/23/2013    Procedure: IRRIGATION AND DEBRIDEMENT RIGHT GREAT TOE;  Surgeon: Mauri Pole, MD;  Location: [facility];  Service: Orthopedics;  Laterality: Right;  . Amputation Right 12/15/2013    Procedure: Right Transmetatarsal Amputation;  Surgeon: Newt Minion, MD;  Location: [place];  Service: Orthopedics;  Laterality: Right;  . Amputation Right 02/23/2014    Procedure: AMPUTATION FOOT;  Surgeon:  Newt Minion, MD;  Location: [place];  Service: Orthopedics;  Laterality: Right;  Right Foot Revision Transmetatarsal Amputation    Assessment & Plan Clinical Impression: Patient is a 64 y.o. right handed female with history of HTN, DM, peripheral neuropathy, anxiety disorder; who was admitted via EMS on 02/15/15 with acute onset of left sided weakness and slurred speech. Upon EMS evaluation BP 220/130 and CBG of 224. She was given labetalol 49m IV x 1 with improvement of BP to 167/71. UDS positive for cocaine. CT head negative and tPA administered. MRI/MRA brain was done revealing acute nonhemorrhagic infarcts in posterior aspect of right lenticular nucleus to posterior corona radiata and posterior limb of L-IC. 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 2D echo showed severe LVH, EF 55-60% and no regional wall abnormalities. BLE dopplers negative for DVT. Speech therapy evaluation reveals mild cognitive deficits in executive functioning and recall. 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 Patient with resultant left hemiparesis superimposed on chronic gait disorder and CIR was recommended by MD and rehab team.    Patient transferred to [facility] 02/20/2015 .    Patient currently requires max with basic self-care skills secondary to muscle weakness, impaired timing and sequencing, unbalanced muscle activation and decreased coordination and decreased standing balance, decreased postural control, hemiplegia and decreased balance strategies.  Prior to hospitalization, patient could complete ADLs with modified independent .  Patient will benefit from skilled intervention to increase independence with basic self-care skills and increase level of independence  with iADL prior to discharge home with care partner.  Anticipate patient will require intermittent supervision and follow up outpatient.  OT - End of Session Activity Tolerance: Endurance does  not limit participation in activity Endurance Deficit: No OT Assessment Rehab Potential (ACUTE ONLY): Good OT Patient demonstrates impairments in the following area(s): Balance;Motor;Safety OT Basic ADL's Functional Problem(s): Eating;Grooming;Bathing;Dressing;Toileting OT Advanced ADL's Functional Problem(s): Simple Meal Preparation OT Transfers Functional Problem(s): Toilet;Tub/Shower OT Additional Impairment(s): Fuctional Use of Upper Extremity OT Plan OT Intensity: Minimum of 1-2 x/day, 45 to 90 minutes OT Frequency: 5 out of 7 days OT Duration/Estimated Length of Stay: 14-18 days OT Treatment/Interventions: Balance/vestibular training;Discharge planning;Disease Lawyer;Functional mobility training;Neuromuscular re-education;Pain management;Patient/family education;Psychosocial support;Self Care/advanced ADL retraining;Therapeutic Activities;Therapeutic Exercise;UE/LE Coordination activities;UE/LE Strength taining/ROM OT Self Feeding Anticipated Outcome(s): Mod I OT Basic Self-Care Anticipated Outcome(s): Supervision OT Toileting Anticipated Outcome(s): Mod I OT Bathroom Transfers Anticipated Outcome(s): Supervision OT Recommendation Patient destination: Home Follow Up Recommendations: Outpatient OT;24 hour supervision/assistance Equipment Recommended: None recommended by OT   Skilled Therapeutic Intervention OT eval completed with education on OT purpose, POC, goals, and ELOS.  ADL assessment completed with squat pivot transfer bed > BSC > w/c > shower chair with max assist for transfers and min/steady assist when standing with UE support.  No clothes on eval, but pt donned bilateral socks and shoes with increased time, requiring assist to fasten Rt shoe.  Pt with decreased RUE strength and FMC with bathing and grooming tasks, requiring increased time. Ambulated approx 8 feet to recliner with mod assist and manual facilitation at pelvis  for trunk control and stability with pt UE support on bed rail. Educated on fall risk and use of call bell.    OT Evaluation Precautions/Restrictions  Precautions Precautions: Fall Precaution Comments: Rt mid foot amputation Restrictions Weight Bearing Restrictions: No Pain Pain Assessment Pain Assessment: No/denies pain Home Living/Prior Functioning Home Living Available Help at Discharge: Family, Available 24 hours/day (plans to d/c to goddaughter's home) Type of Home: Apartment Home Access: Stairs to enter [employer] of Steps: 3 steps into god daughter's house, reports no rails but has "poles" Entrance Stairs-Rails: None Home Layout: One level Additional Comments: Pt reports plan is to d/c to god daughter, Tonya's home.  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  ADL  See FIM Vision/Perception  Vision- History Baseline Vision/History: Glaucoma;Wears glasses Wears Glasses: At all times Patient Visual Report: No change from baseline Vision- Assessment Vision Assessment?: No apparent visual deficits  Cognition Overall Cognitive Status: Within Functional Limits for tasks assessed Arousal/Alertness: Awake/alert Orientation Level: Oriented to person;Oriented to place;Oriented to time;Oriented to situation Attention: Selective Selective Attention:  Appears intact Memory: Impaired Memory Impairment: Decreased recall of new information Awareness: Appears intact Problem Solving: Appears intact Decision Making: Appears intact Safety/Judgment: Appears intact Sensation Coordination Fine Motor Movements are Fluid and Coordinated: No Finger Nose Finger Test: slow with undershooting on Lt Extremity/Trunk Assessment RUE Assessment RUE Assessment: Within Functional Limits LUE Assessment LUE Assessment: Exceptions to [facility] (  ROM WFL, impaired coordination and motor control, strength grossly 3-/5 at shoulder, 3+/5 bicep/tricep)  FIM:  FIM - Grooming Grooming Steps: Wash, rinse, dry face;Wash, rinse, dry hands Grooming: 4: Steadying assist  or patient completes 3 of 4 or 4 of 5 steps FIM - Bathing Bathing Steps Patient Completed: Chest;Right Arm;Left Arm;Abdomen;Front perineal area;Buttocks;Right upper leg;Left upper leg;Right lower leg (including foot);Left lower leg (including foot) Bathing: 4: Steadying assist FIM - Upper Body Dressing/Undressing Upper body dressing/undressing: 0: Activity did not occur FIM - Lower Body Dressing/Undressing Lower body dressing/undressing steps patient completed: Don/Doff right sock;Don/Doff left sock;Don/Doff right shoe;Don/Doff left shoe;Fasten/unfasten left shoe Lower body dressing/undressing: 2: Max-Patient completed 25-49% of tasks FIM - Toileting Toileting steps completed by patient: Performs perineal hygiene Toileting: 2: Max-Patient completed 1 of 3 steps FIM - Bed/Chair Transfer Bed/Chair Transfer: 5: Supine > Sit: Supervision (verbal cues/safety issues);2: Bed > Chair or W/C: Max A (lift and lower assist) FIM - Radio producer Devices: Bedside commode Toilet Transfers: 2-To toilet/BSC: Max A (lift and lower assist);2-From toilet/BSC: Max A (lift and lower assist) FIM - Tub/Shower Transfers Tub/Shower Assistive Devices: Tub transfer bench;Grab bars;Walk in  shower Tub/shower Transfers: 3-Into Tub/Shower: Mod A (lift or lower/lift 2 legs);3-Out of Tub/Shower: Mod A (lift or lower/lift 2 legs)   Refer to Care Plan for Long Term Goals  Recommendations for other services: None  Discharge Criteria: Patient will be discharged from OT if patient refuses treatment 3 consecutive times without medical reason, if treatment goals not met, if there is a change in medical status, if patient makes no progress towards goals or if patient is discharged from hospital.  The above assessment, treatment plan, treatment alternatives and goals were discussed and mutually agreed upon: by patient  Ellwood Dense [facility] 02/21/2015, 10:01 AM

## 2015-02-22 ENCOUNTER — Inpatient Hospital Stay (HOSPITAL_COMMUNITY): Payer: BC Managed Care – PPO | Admitting: Occupational Therapy

## 2015-02-22 ENCOUNTER — Inpatient Hospital Stay (HOSPITAL_COMMUNITY): Payer: Self-pay | Admitting: Physical Therapy

## 2015-02-22 ENCOUNTER — Inpatient Hospital Stay (HOSPITAL_COMMUNITY): Payer: BC Managed Care – PPO | Admitting: Speech Pathology

## 2015-02-22 LAB — GLUCOSE, CAPILLARY
GLUCOSE-CAPILLARY: 175 mg/dL — AB (ref 70–99)
GLUCOSE-CAPILLARY: 199 mg/dL — AB (ref 70–99)
Glucose-Capillary: 248 mg/dL — ABNORMAL HIGH (ref 70–99)
Glucose-Capillary: 174 mg/dL — ABNORMAL HIGH (ref 70–99)

## 2015-02-22 MED ORDER — INSULIN GLARGINE 100 UNIT/ML ~~LOC~~ SOLN
35.0000 [IU] | Freq: Every day | SUBCUTANEOUS | Status: DC
  Administered 2015-02-22 – 2015-02-27 (×4): 35 [IU] via SUBCUTANEOUS
  Filled 2015-02-22 (×8): qty 0.35

## 2015-02-22 NOTE — IPOC Note (Addendum)
Overall Plan of Care [facility]) Patient Details Name: Tara Hopkins MRN: [id number] DOB: 1951/10/23  Admitting Diagnosis: R CVA  Hospital Problems: Principal Problem:   CVA (cerebral infarction) Active Problems:   DM type 2 with diabetic peripheral neuropathy   Left hemiparesis     Functional Problem List: Nursing Endurance, Medication Management, Safety, Skin Integrity  PT Balance, Motor, Perception, Safety, Sensory, Skin Integrity  OT Balance, Motor, Safety  SLP    TR Activity tolerance, functional mobility, balance, safety, pain, anxiety/stress        Basic ADL's: OT Eating, Grooming, Bathing, Dressing, Toileting     Advanced  ADL's: OT Simple Meal Preparation     Transfers: PT Bed Mobility, Bed to Chair, Car, Furniture, Floor  OT Toilet, Metallurgist: PT Ambulation, Emergency planning/management officer, Stairs     Additional Impairments: OT Fuctional Use of Upper Extremity  SLP        TR      Anticipated Outcomes Item Anticipated Outcome  Self Feeding Mod I  Swallowing      Basic self-care  Supervision  Toileting  Mod I   Bathroom Transfers Supervision  Bowel/Bladder  manage bowel and bladder min assist  Transfers  Supervision  Locomotion  Min A ambulatory level; Supervision w/c level  Communication     Cognition     Pain  3 or less  Safety/Judgment  minimal assist   Therapy Plan: PT Intensity: Minimum of 1-2 x/day ,45 to 90 minutes PT Frequency: 5 out of 7 days PT Duration Estimated Length of Stay: 14-18 OT Intensity: Minimum of 1-2 x/day, 45 to 90 minutes OT Frequency: 5 out of 7 days OT Duration/Estimated Length of Stay: 14-18 days  TR Duration/ELOS: 2 weeks TR Frequency:  Min 1 time per week >20 minutes       Team Interventions: Nursing Interventions Patient/Family Education, Disease Management/Prevention, Skin Care/Wound Management, Medication Management  PT interventions Ambulation/gait training, Training and development officer,  Community reintegration, Discharge planning, Disease management/prevention, DME/adaptive equipment instruction, Functional mobility training, Neuromuscular re-education, Pain management, Patient/family education, Skin care/wound management, Splinting/orthotics, Stair training, Therapeutic Activities, Therapeutic Exercise, UE/LE Strength taining/ROM, UE/LE Coordination activities, Visual/perceptual remediation/compensation, Wheelchair propulsion/positioning  OT Interventions Training and development officer, Discharge planning, Disease mangement/prevention, DME/adaptive equipment instruction, Functional mobility training, Neuromuscular re-education, Pain management, Patient/family education, Psychosocial support, Self Care/advanced ADL retraining, Therapeutic Activities, Therapeutic Exercise, UE/LE Coordination activities, UE/LE Strength taining/ROM  SLP Interventions    TR Interventions Recreation/leisure participation, Balance/Vestibular training, functional mobility, therapeutic activities, UE/LE strength/coordination, w/c mobility, community reintegration, pt/family education, adaptive equipment instruction/use, discharge planning, psychosocial support  SW/CM Interventions Discharge Planning, Barrister's clerk, Patient/Family Education    Team Discharge Planning: Destination: PT-Home ,OT- Home , SLP-  Projected Follow-up: PT-Outpatient PT, Home health PT, 24 hour supervision/assistance, OT-  Outpatient OT, 24 hour supervision/assistance, SLP-  Projected Equipment Needs: PT-To be determined, OT- None recommended by OT, SLP-  Equipment Details: PT- , OT-  Patient/family involved in discharge planning: PT- Patient,  OT-Patient, SLP-   MD ELOS: 13-17d Medical Rehab Prognosis:  Good Assessment: 64 y.o. RH-female with history of HTN, DM, peripheral neuropathy, anxiety disorder; who was admitted via EMS on 02/15/15 with acute onset of left sided weakness and slurred speech. Upon EMS evaluation BP 220/130  and CBG of 224. She was given labetalol 10mg  IV x 1 with improvement of BP to 167/71. UDS positive for cocaine. CT head negative and tPA administered. MRI/MRA brain was done revealing acute nonhemorrhagic infarcts in posterior aspect of right lenticular  nucleus to posterior corona radiata and posterior limb of L-IC. 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 2D echo showed severe LVH, EF 55-60% and no regional wall abnormalities  Now requiring 24/7 Rehab RN,MD, as well as CIR level PT, OT and SLP.  Treatment team will focus on ADLs and mobility with goals set at Sup   See Team Conference Notes for weekly updates to the plan of care

## 2015-02-22 NOTE — Evaluation (Signed)
Speech Language Pathology Assessment and Plan  Patient Details  Name: Tara Hopkins MRN: [id number] Date of Birth: [birth year]  SLP Diagnosis: Cognitive Impairments  Rehab Potential: Good ELOS:      Today's Date: 02/22/2015 SLP Individual Time: 3086-5784 SLP Individual Time Calculation (min): 60 min   Problem List:  Patient Active Problem List   Diagnosis Date Noted  . CVA (cerebral infarction) 02/20/2015  . Left hemiparesis 02/20/2015  . Cocaine abuse   . Stroke 02/15/2015  . 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 Osteomyelitis of foot, right, acute 02/23/2014  . 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 Diabetic foot infection with ulcer and abcess, right 05/21/2013  . DM type 2 with diabetic peripheral neuropathy 05/21/2013  . Benign hypertension 05/21/2013  . Depression 05/21/2013  . Hypokalemia 05/21/2013  . Hyponatremia 05/21/2013  . Anemia 05/21/2013  . Renal insufficiency 05/21/2013   Past Medical History:  Past Medical History  Diagnosis Date  . Diabetes mellitus without complication   . Hypercholesteremia   . Peripheral neuropathy   . Hypertension     not on medications any longer  . Anxiety   . Depression   . History of blood transfusion   . History of hyperbaric oxygen therapy     pt reports 80 treatments.  . Anemia   . Glaucoma    Past Surgical History:  Past Surgical History  Procedure Laterality Date  . I&d extremity Right 05/23/2013    Procedure: IRRIGATION AND DEBRIDEMENT RIGHT GREAT TOE;  Surgeon: Mauri Pole, MD;  Location: [facility];  Service: Orthopedics;  Laterality: Right;  . Amputation Right 12/15/2013    Procedure: Right Transmetatarsal Amputation;  Surgeon: Newt Minion, MD;  Location: [place];  Service: Orthopedics;  Laterality: Right;  . Amputation Right 02/23/2014    Procedure: AMPUTATION FOOT;  Surgeon: Newt Minion, MD;  Location: [place];  Service: Orthopedics;  Laterality: Right;  Right  Foot Revision Transmetatarsal Amputation    Assessment / Plan / Recommendation Clinical Impression Viktorya Hopkins is a 64 year old right-handed female with history of HTN, DM, peripheral neuropathy, anxiety disorder; who was admitted via EMS on 02/15/15 with acute onset of left sided weakness and slurred speech.  Upon EMS evaluation BP  220/130 and CBG of 224. She was given labetalol 55m IV x 1 with improvement of BP to 167/71. UDS positive for cocaine. CT head negative and tPA administered.  MRI/MRA brain was done revealing acute nonhemorrhagic infarcts in posterior aspect of right lenticular nucleus to posterior corona radiata and posterior limb of L-IC. 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 2D echo showed severe LVH, EF 55-60% and no regional wall abnormalities. BLE dopplers negative for DVT.  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 Patient with resultant left hemiparesis superimposed on chronic gait disorder and CIR was recommended by MD and rehab team.  Patient admitted [facility]2015 and demonstrates mild cognitive impairments impacting memory and safety awareness, and executive function with complex tasks which impacts the patient's overall safety with functional tasks.  Patient would benefit from skilled SLP intervention to maximize cognition impairments, in order to maximize her functional independence prior to discharge.  Anticipate patient will require 24 hour supervision with possible follow up SLP services.    Skilled Therapeutic Interventions          Skilled cognitive evaluation completed; see above. Reviewed results  and recommendations with patient.  SLP Assessment  Patient will need skilled [facility] during CIR admission    Recommendations  Follow up Recommendations:  (Pt may potential require addtional ST services upon discharge on higher executive functional skills  to reach modified independence.)    SLP Frequency 3 to 5 out of 7 days   SLP Treatment/Interventions Cognitive remediation/compensation;Cueing hierarchy;Internal/external aids;Functional tasks;Speech/Language facilitation;Patient/family education;Multimodal communication approach;Therapeutic Activities    Pain Pain Assessment Pain Assessment: No/denies pain  Prior Functioning Cognitive/Linguistic Baseline: Within functional limits Type of Home: Apartment  Lives With: Alone Available Help at Discharge: Family;Available 24 hours/day Education: high school Vocation: Retired  Industrial/product designer Term Goals: Week 1: SLP Short Term Goal 1 (Week 1): Pt will recall at least 2 details of new information using external aids with functional daily living tasks with supervision. SLP Short Term Goal 2 (Week 1): Pt will demonstrate appropriate safety awareness to complex situations and/or tasks with functional tasks with supervision. SLP Short Term Goal 3 (Week 1): Pt will demonstrate appropriate executive function skills to participate in money/medication management functional tasks with supervision.  See FIM for current functional status Refer to Care Plan for Long Term Goals  Recommendations for other services: None  Discharge Criteria: Patient will be discharged from SLP if patient refuses treatment 3 consecutive times without medical reason, if treatment goals not met, if there is a change in medical status, if patient makes no progress towards goals or if patient is discharged from hospital.  The above assessment, treatment plan, treatment alternatives and goals were discussed and mutually agreed upon: by patient  Tara Howells, MA CCC-SLP  Tara Hopkins A 02/22/2015, 3:14 PM

## 2015-02-22 NOTE — Progress Notes (Signed)
64 y.o. RH-female with history of HTN, DM, peripheral neuropathy, anxiety disorder; who was admitted via EMS on 02/15/15 with acute onset of left sided weakness and slurred speech. Upon EMS evaluation BP 220/130 and CBG of 224. She was given labetalol 31m IV x 1 with improvement of BP to 167/71. UDS positive for cocaine. CT head negative and tPA administered. MRI/MRA brain was done revealing acute nonhemorrhagic infarcts in posterior aspect of right lenticular nucleus to posterior corona radiata and posterior limb of L-IC. 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 2D echo showed severe LVH, EF 55-60% and no regional wall abnormalities. BLE dopplers negative for DVT. Speech therapy evaluation reveals mild cognitive deficits in executive functioning and recall. 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 Subjective/Complaints: Pt without new issues overnite, pt slept well, worn out from therapy Used lantus 30Units at home  ROS - weakness Left side Objective: Vital Signs: Blood pressure 159/64, pulse 76, temperature 98.2 F (36.8 C), temperature source Oral, resp. rate 18, SpO2 98 %. No results found. Results for orders placed or performed during the hospital encounter of 02/20/15 (from the past 72 hour(s))  Glucose, capillary     Status: Abnormal   Collection Time: 02/20/15  8:47 PM  Result Value Ref Range   Glucose-Capillary 150 (H) 70 - 99 mg/dL   Comment 1 Notify RN   Glucose, capillary     Status: None   Collection Time: 02/21/15  6:37 AM  Result Value Ref Range   Glucose-Capillary 96 70 - 99 mg/dL   Comment 1 Notify RN   CBC WITH DIFFERENTIAL     Status: Abnormal   Collection Time: 02/21/15  6:57 AM  Result Value Ref Range   WBC 7.5 4.0 - 10.5 K/uL   RBC 3.83 (L) 3.87 - 5.11 MIL/uL   Hemoglobin 11.0 (L) 12.0 - 15.0 g/dL   HCT 34.3 (L) 36.0 - 46.0 %   MCV 89.6 78.0 - 100.0 fL   MCH 28.7 26.0 - 34.0 pg   MCHC 32.1 30.0 -  36.0 g/dL   RDW 13.9 11.5 - 15.5 %   Platelets 294 150 - 400 K/uL   Neutrophils Relative % 55 43 - 77 %   Neutro Abs 4.1 1.7 - 7.7 K/uL   Lymphocytes Relative 37 12 - 46 %   Lymphs Abs 2.8 0.7 - 4.0 K/uL   Monocytes Relative 5 3 - 12 %   Monocytes Absolute 0.4 0.1 - 1.0 K/uL   Eosinophils Relative 2 0 - 5 %   Eosinophils Absolute 0.2 0.0 - 0.7 K/uL   Basophils Relative 1 0 - 1 %   Basophils Absolute 0.0 0.0 - 0.1 K/uL  Comprehensive metabolic panel     Status: Abnormal   Collection Time: 02/21/15  6:57 AM  Result Value Ref Range   Sodium 141 135 - 145 mmol/L   Potassium 4.4 3.5 - 5.1 mmol/L   Chloride 107 96 - 112 mmol/L   CO2 25 19 - 32 mmol/L   Glucose, Bld 95 70 - 99 mg/dL   BUN 23 6 - 23 mg/dL   Creatinine, Ser 1.51 (H) 0.50 - 1.10 mg/dL   Calcium 9.3 8.4 - 10.5 mg/dL   Total Protein 6.4 6.0 - 8.3 g/dL   Albumin 3.4 (L) 3.5 - 5.2 g/dL   AST 22 0 - 37 U/L   ALT 24 0 - 35 U/L   Alkaline Phosphatase 75 39 -  117 U/L   Total Bilirubin 0.5 0.3 - 1.2 mg/dL   GFR calc non Af Amer 36 (L) >90 mL/min   GFR calc Af Amer 42 (L) >90 mL/min    Comment: (NOTE) The eGFR has been calculated using the CKD EPI equation. This calculation has not been validated in all clinical situations. eGFR's persistently <90 mL/min signify possible Chronic Kidney Disease.    Anion gap 9 5 - 15  Glucose, capillary     Status: Abnormal   Collection Time: 02/21/15 11:37 AM  Result Value Ref Range   Glucose-Capillary 173 (H) 70 - 99 mg/dL  Glucose, capillary     Status: Abnormal   Collection Time: 02/21/15  4:57 PM  Result Value Ref Range   Glucose-Capillary 240 (H) 70 - 99 mg/dL  Glucose, capillary     Status: Abnormal   Collection Time: 02/21/15  9:12 PM  Result Value Ref Range   Glucose-Capillary 259 (H) 70 - 99 mg/dL  Glucose, capillary     Status: Abnormal   Collection Time: 02/22/15  6:59 AM  Result Value Ref Range   Glucose-Capillary 248 (H) 70 - 99 mg/dL     HEENT: poor  dentition Cardio: RRR and no murmur Resp: CTA B/L and unlabored GI: BS positive and NT, ND Extremity:  Pulses positive and No Edema Skin:   Wound Right post leg 1.5cm gransulating no odor Neuro: Alert/Oriented, Cranial Nerve II-XII normal, Normal Sensory, Abnormal Motor 4-/5 R delt bi tri grip,4 L HF , KE ankle DF, R 5/5 except Right ankle limited by pertial foot amp and Abnormal FMC Ataxic/ dec FMC Musc/Skel:  Other Right trans met amp Gen NAD   Assessment/Plan: 1. Functional deficits secondary to right lenticular nucleus/corona radiate thrombotic infarct  which require 3+ hours per day of interdisciplinary therapy in a comprehensive inpatient rehab setting. Physiatrist is providing close team supervision and 24 hour management of active medical problems listed below. Physiatrist and rehab team continue to assess barriers to discharge/monitor patient progress toward functional and medical goals. FIM: FIM - Bathing Bathing Steps Patient Completed: Chest, Right Arm, Left Arm, Abdomen, Front perineal area, Buttocks, Right upper leg, Left upper leg, Right lower leg (including foot), Left lower leg (including foot) Bathing: 4: Steadying assist  FIM - Upper Body Dressing/Undressing Upper body dressing/undressing: 0: Activity did not occur FIM - Lower Body Dressing/Undressing Lower body dressing/undressing steps patient completed: Don/Doff right sock, Don/Doff left sock, Don/Doff right shoe, Don/Doff left shoe, Fasten/unfasten left shoe Lower body dressing/undressing: 2: Max-Patient completed 25-49% of tasks  FIM - Toileting Toileting steps completed by patient: Performs perineal hygiene Toileting: 2: Max-Patient completed 1 of 3 steps  FIM - Radio producer Devices: Bedside commode Toilet Transfers: 2-To toilet/BSC: Max A (lift and lower assist), 2-From toilet/BSC: Max A (lift and lower assist)  FIM - Control and instrumentation engineer Devices:  Arm rests Bed/Chair Transfer: 2: Chair or W/C > Bed: Max A (lift and lower assist), 3: Bed > Chair or W/C: Mod A (lift or lower assist) (recliner <> w/c)  FIM - Locomotion: Wheelchair Distance: 40 Locomotion: Wheelchair: 1: Total Assistance/staff pushes wheelchair (Pt<25%) FIM - Locomotion: Ambulation Locomotion: Ambulation Assistive Devices: Other (comment) (R rail then hemi walker;; ACE bandage for L ankle dorsiflexion) Ambulation/Gait Assistance: 3: Mod assist (HHA) Locomotion: Ambulation: 1: Two helpers  Comprehension Comprehension Mode: Auditory Comprehension: 5-Understands complex 90% of the time/Cues < 10% of the time  Expression Expression Mode: Verbal Expression: 5-Expresses complex 90%  of the time/cues < 10% of the time  Social Interaction Social Interaction: 6-Interacts appropriately with others with medication or extra time (anti-anxiety, antidepressant).  Problem Solving Problem Solving: 5-Solves basic problems: With no assist  Memory Memory: 5-Recognizes or recalls 90% of the time/requires cueing < 10% of the time  Medical Problem List and Plan: 1. Functional deficits secondary to hypertensive right lenticular nucleus/corona radiate infarct 2. DVT Prophylaxis/Anticoagulation: Pharmaceutical: Lovenox 3. Pain Management: neurontin for peripheral neuropathy.  4. Bipolar disorder/Mood: Appears stable. Continue Lamictal bid. Klonopin as needed prn anxiety.  5. Neuropsych: This patient is capable of making decisions on her own behalf. 6. Skin/Wound Care: Continue santyl to ulcer on right shin. Routine pressure relief measures.  7. Fluids/Electrolytes/Nutrition: Monitor I/O. Check follow up lytes in am.  8. DM type 2 with peripheral neuropathy : Monitor BS with ac/hs checks. Continue Trajenta, amaryl to bid and lantus with SSI for elevated BS.  9. CKD stage III: Monitor for stability. Labs stable.  10. Dyslipidemia: On Zocor. 11. HTN: Monitor BP every 8 hours.  Continue norvasc and lisinopril.  12.  S/P  Right partial foot amp LOS (Days) 2 A FACE TO FACE EVALUATION WAS PERFORMED  KIRSTEINS,ANDREW E 02/22/2015, 8:09 AM

## 2015-02-22 NOTE — Progress Notes (Signed)
Physical Therapy Session Note  Patient Details  Name: Tara Hopkins MRN: [id number] Date of Birth: July 05, 1951  Today's Date: 02/22/2015 PT Individual Time: 1307-1403 PT Individual Time Calculation (min): 56 min   Short Term Goals: Week 1:  PT Short Term Goal 1 (Week 1): Pt will perform bed mobility mod I.  PT Short Term Goal 2 (Week 1): Pt will perform bed<>w/c transfer with min A and 25% cues from PT.  PT Short Term Goal 3 (Week 1): Pt will propel w/c 100' with mod A and 50% cues from PT.  PT Short Term Goal 4 (Week 1): Pt will ambulate 83' with mod A and LRAD with 50% cues from PT  Skilled Therapeutic Interventions/Progress Updates:    Pt received seated in recliner, asleep but easily aroused. Per pt request to use bathroom, pt performed stand pivot transfer from w/c <> toilet with grab bars requiring max A, manual facilitation of lateral weight shift to R side during pivoting, multimodal cueing for LLE attention and initiation of LLE advancement. Transported pt to gym in w/c with total A for energy conservation. See below for detailed description of NMR interventions. Frequent seated rest breaks required, per pt request, due to fatigue. Pt performed gait x40' in controlled environment with rolling walker, L hand orthosis, and L AFO (Toe Off) for tactile feedback to prevent L knee buckling. Mod A for initial 75% of ambulation trial, Max A for remainder of trial secondary to onset of L knee buckling, pt fatigue. Tactile/verbal cueing required for LLE clearance due to limited L hip/knee flexion during LLE advancement. Session ended in pt room, where pt was left seated in recliner with all needs within reach.  Therapy Documentation Precautions:  Precautions Precautions: Fall Precaution Comments: Rt mid foot amputation Restrictions Weight Bearing Restrictions: No Pain: Pain Assessment Pain Assessment: No/denies pain Pain Score: 0-No pain Locomotion : Ambulation Ambulation/Gait  Assistance: 3: Mod assist  NMR: Neuromuscular Facilitation: Left;Lower Extremity;Activity to increase motor control;Activity to increase anterior-posterior weight shifting;Activity to increase sustained activation;Activity to increase grading Standing in front of mat table with bilat UE support, pt performed L terminal knee extension with multimodal cueing for motor control (to prevent L knee buckling and recurvatum) and mirror positioned to L of pt for visual feedback.Transitioned to blocked practice of sit <> stand transfers with multimodal cueing to emphasize anterior weight shift, erect trunk flexion, and motor control in L knee.  See FIM for current functional status  Therapy/Group: Individual Therapy  Hobble, Malva Cogan 02/22/2015, 7:04 PM

## 2015-02-22 NOTE — Progress Notes (Signed)
Occupational Therapy Session Note  Patient Details  Name: Tara Hopkins MRN: [id number] Date of Birth: 01-Dec-1950  Today's Date: 02/22/2015 OT Individual Time: 6415-8309 and 1435-1510 OT Individual Time Calculation (min): 60 min and 35 min   Short Term Goals: Week 1:  OT Short Term Goal 1 (Week 1): Pt will complete tub/shower transfer with min assist with LRAD OT Short Term Goal 2 (Week 1): Pt will complete toilet transfer with min assist with LRAD OT Short Term Goal 3 (Week 1): Pt will complete UB dressing with supervision OT Short Term Goal 4 (Week 1): Pt will complete LB dressing with min assist OT Short Term Goal 5 (Week 1): Pt will complete grooming tasks in standing with use of LUE as gross assist  Skilled Therapeutic Interventions/Progress Updates:   1) Engaged in ADL retraining with focus on functional transfers, sit <> stand, and functional use of LUE during self-care tasks.  Pt in recliner, eating breakfast upon arrival.  Performed stand step transfer recliner > w/c with mod assist for sit > stand and steady assist during stepping.  Bathing completed at sit > stand level in room shower with min/steady assist when standing to wash buttocks.  Pt with clothes this session, donned shirt with increased time and assist to adjust completely over trunk also required assist for standing balance and pulling pants over Lt hip.  Pt attempted to fasten shoes with leaning forward, encouraged pt to cross leg over opposite knee to increase stability and safety.  9 hole peg test with Rt: 24 seconds and Lt: 2:07, educated on stroke recovery and impact of Smoot with self-care tasks.  2) Engaged in therapeutic activity and NMR in sitting progressing to standing with focus on LUE gross and fine motor coordination.  In therapy gym utilized 3D pipe tree with focus on LUE gross and fine motor coordination with pt have decreased coordination and manipulation of pipe pieces.  Progressed to standing with pt  requiring blocking in front and back of knee to provide support and promote increased knee control in standing while completing pipe tree activity. Noted pt to lean to Lt with fatigue, requiring increased tactile cues and manual facilitation at pelvis and Lt knee to promote midline standing.  Discussed emotional support and neuropsych if needed as pt reports having a grandmother who had a stroke and became very depressed.  Also provided pt with [facility] to complete during downtime.  Therapy Documentation Precautions:  Precautions Precautions: Fall Precaution Comments: Rt mid foot amputation Restrictions Weight Bearing Restrictions: No General:   Vital Signs: Therapy Vitals BP: (!) 161/67 mmHg Pain:  Pt with no c/o pain  See FIM for current functional status  Therapy/Group: Individual Therapy  [name] 02/22/2015, 10:49 AM

## 2015-02-23 ENCOUNTER — Inpatient Hospital Stay (HOSPITAL_COMMUNITY): Payer: Self-pay | Admitting: Physical Therapy

## 2015-02-23 ENCOUNTER — Inpatient Hospital Stay (HOSPITAL_COMMUNITY): Payer: BC Managed Care – PPO | Admitting: Occupational Therapy

## 2015-02-23 ENCOUNTER — Inpatient Hospital Stay (HOSPITAL_COMMUNITY): Payer: BC Managed Care – PPO | Admitting: Speech Pathology

## 2015-02-23 ENCOUNTER — Encounter (HOSPITAL_COMMUNITY): Payer: Self-pay | Admitting: Occupational Therapy

## 2015-02-23 LAB — GLUCOSE, CAPILLARY
GLUCOSE-CAPILLARY: 170 mg/dL — AB (ref 70–99)
GLUCOSE-CAPILLARY: 119 mg/dL — AB (ref 70–99)
Glucose-Capillary: 166 mg/dL — ABNORMAL HIGH (ref 70–99)
Glucose-Capillary: 159 mg/dL — ABNORMAL HIGH (ref 70–99)

## 2015-02-23 NOTE — Progress Notes (Signed)
Occupational Therapy Session Note  Patient Details  Name: Tara Hopkins MRN: [id number] Date of Birth: Oct 10, 1951  Today's Date: 02/23/2015 OT Individual Time:  -   1330-1415  (45 min)      Short Term Goals: Week 1:  OT Short Term Goal 1 (Week 1): Pt will complete tub/shower transfer with min assist with LRAD OT Short Term Goal 2 (Week 1): Pt will complete toilet transfer with min assist with LRAD OT Short Term Goal 3 (Week 1): Pt will complete UB dressing with supervision OT Short Term Goal 4 (Week 1): Pt will complete LB dressing with min assist OT Short Term Goal 5 (Week 1): Pt will complete grooming tasks in standing with use of LUE as gross assist  Skilled Therapeutic Interventions/Progress Updates:    OT addressed basic functional transfers, bed mobility, standing balance, attention, left upper extremity,  therapeutic activities in functional task.  OT instructed pt on LUE ball bouncing, rolling tossing.  Practiced isometric holding with LUE in sho flexion, elbow extension, and wrist extension.  OT instructed pt is proper techniques for sit to stand and stabilizing when standing.  Pt demonstrated mod assist assistance when rotating to sit.  Transferred to toilet with mos assist at end of session with max assist with pants manipulation.  Left pt in wc at end of session with all needs in reach.    Therapy Documentation Precautions:  Precautions Precautions: Fall Precaution Comments: Rt mid foot amputation Restrictions Weight Bearing Restrictions: No :  Pain:  none       See FIM for current functional status  Therapy/Group: Individual Therapy  Lisa Roca 02/23/2015, 3:12 PM

## 2015-02-23 NOTE — Progress Notes (Signed)
Physical Therapy Session Note  Patient Details  Name: Tara Hopkins MRN: [id number] Date of Birth: 1951/08/27  Today's Date: 02/23/2015 PT Individual Time: 1000-1045 PT Individual Time Calculation (min): 45 min   Short Term Goals: Week 1:  PT Short Term Goal 1 (Week 1): Pt will perform bed mobility mod I.  PT Short Term Goal 2 (Week 1): Pt will perform bed<>w/c transfer with min A and 25% cues from PT.  PT Short Term Goal 3 (Week 1): Pt will propel w/c 100' with mod A and 50% cues from PT.  PT Short Term Goal 4 (Week 1): Pt will ambulate 40' with mod A and LRAD with 50% cues from PT  Skilled Therapeutic Interventions/Progress Updates:   Pt demonstrates improvement initiating free gait in session. Pt most limited by impaired weight shift in gait and standing activities benefiting from cueing. Harder to carry over cueing in more dynamic activities like gait. Pt would continue to benefit from skilled PT services to increase functional mobility.  Therapy Documentation Precautions:  Precautions Precautions: Fall Precaution Comments: Rt mid foot amputation Restrictions Weight Bearing Restrictions: No Pain: Pain Assessment Pain Assessment: No/denies pain Pain Score: 0-No pain Mobility:  Pt performs transfers Mod/Max A with cues for weight shift and technique Locomotion : Ambulation Ambulation/Gait Assistance: 1: +2 Total assist;3: Mod assist (+2A for w/c follow)  Other Treatments:   Pt performs transfers x15 in session. Pt performs standing balance 1'x5 with cues for anterior weight shift and facilitation of upright posture. Pt performs pre-gait, heel raises, BLE advancement, weight shifting in tandem stance 2x10.Pt educated on rehab plan, safety in mobility, rehab progress, deficits, and neuroplasticity.  See FIM for current functional status  Therapy/Group: Individual Therapy  Monia Pouch 02/23/2015, 10:41 AM

## 2015-02-23 NOTE — Progress Notes (Signed)
Speech Language Pathology Daily Session Note  Patient Details  Name: Tara Hopkins MRN: [id number] Date of Birth: [birth year]  Today's Date: 02/23/2015 SLP Individual Time: 1445-1530 SLP Individual Time Calculation (min): 45 min  Short Term Goals: Week 1: SLP Short Term Goal 1 (Week 1): Pt will recall at least 2 details of new information using external aids with functional daily living tasks with supervision. SLP Short Term Goal 2 (Week 1): Pt will demonstrate appropriate safety awareness to complex situations and/or tasks with functional tasks with supervision. SLP Short Term Goal 3 (Week 1): Pt will demonstrate appropriate executive function skills to participate in money/medication management functional tasks with supervision.  Skilled Therapeutic Interventions: Skilled treatment session focused on cognitive goals. SLP facilitated session by providing supervision verbal cues for functional problem solving and organization with mildly complex, money management task. Patient was able to recall 9 of her 12 current medications and their functions but will need to return demonstration of organizing a two time per day pill box in next session. Patient recalled events from previous therapy sessions with Min A verbal cues and identified 3 risk factors and habits that she will need to address at discharge to increase her health and decrease her risk of another stroke with Mod I. Patient left in recliner with all needs within reach.    FIM:  Comprehension Comprehension Mode: Auditory Comprehension: 5-Understands complex 90% of the time/Cues < 10% of the time Expression Expression Mode: Verbal Expression: 5-Expresses complex 90% of the time/cues < 10% of the time Social Interaction Social Interaction: 5-Interacts appropriately 90% of the time - Needs monitoring or encouragement for participation or interaction. Problem Solving Problem Solving: 5-Solves complex 90% of the time/cues < 10% of the  time Memory Memory: 4-Recognizes or recalls 75 - 89% of the time/requires cueing 10 - 24% of the time  Pain Pain Assessment Pain Assessment: No/denies pain  Therapy/Group: Individual Therapy  Patricia Perales 02/23/2015, 4:23 PM

## 2015-02-23 NOTE — Progress Notes (Signed)
64 y.o. RH-female with history of HTN, DM, peripheral neuropathy, anxiety disorder; who was admitted via EMS on 02/15/15 with acute onset of left sided weakness and slurred speech. Upon EMS evaluation BP 220/130 and CBG of 224. She was given labetalol 43m IV x 1 with improvement of BP to 167/71. UDS positive for cocaine. CT head negative and tPA administered. MRI/MRA brain was done revealing acute nonhemorrhagic infarcts in posterior aspect of right lenticular nucleus to posterior corona radiata and posterior limb of L-IC. 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 2D echo showed severe LVH, EF 55-60% and no regional wall abnormalities. BLE dopplers negative for DVT. Speech therapy evaluation reveals mild cognitive deficits in executive functioning and recall. 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   Subjective/Complaints: Loves therapy. Wants to be independent again! Sleeping well ROS - weakness Left side Objective: Vital Signs: Blood pressure 127/60, pulse 68, temperature 97.5 F (36.4 C), temperature source Oral, resp. rate 17, SpO2 99 %. No results found. Results for orders placed or performed during the hospital encounter of 02/20/15 (from the past 72 hour(s))  Glucose, capillary     Status: Abnormal   Collection Time: 02/20/15  8:47 PM  Result Value Ref Range   Glucose-Capillary 150 (H) 70 - 99 mg/dL   Comment 1 Notify RN   Glucose, capillary     Status: None   Collection Time: 02/21/15  6:37 AM  Result Value Ref Range   Glucose-Capillary 96 70 - 99 mg/dL   Comment 1 Notify RN   CBC WITH DIFFERENTIAL     Status: Abnormal   Collection Time: 02/21/15  6:57 AM  Result Value Ref Range   WBC 7.5 4.0 - 10.5 K/uL   RBC 3.83 (L) 3.87 - 5.11 MIL/uL   Hemoglobin 11.0 (L) 12.0 - 15.0 g/dL   HCT 34.3 (L) 36.0 - 46.0 %   MCV 89.6 78.0 - 100.0 fL   MCH 28.7 26.0 - 34.0 pg   MCHC 32.1 30.0 - 36.0 g/dL   RDW 13.9 11.5 - 15.5 %    Platelets 294 150 - 400 K/uL   Neutrophils Relative % 55 43 - 77 %   Neutro Abs 4.1 1.7 - 7.7 K/uL   Lymphocytes Relative 37 12 - 46 %   Lymphs Abs 2.8 0.7 - 4.0 K/uL   Monocytes Relative 5 3 - 12 %   Monocytes Absolute 0.4 0.1 - 1.0 K/uL   Eosinophils Relative 2 0 - 5 %   Eosinophils Absolute 0.2 0.0 - 0.7 K/uL   Basophils Relative 1 0 - 1 %   Basophils Absolute 0.0 0.0 - 0.1 K/uL  Comprehensive metabolic panel     Status: Abnormal   Collection Time: 02/21/15  6:57 AM  Result Value Ref Range   Sodium 141 135 - 145 mmol/L   Potassium 4.4 3.5 - 5.1 mmol/L   Chloride 107 96 - 112 mmol/L   CO2 25 19 - 32 mmol/L   Glucose, Bld 95 70 - 99 mg/dL   BUN 23 6 - 23 mg/dL   Creatinine, Ser 1.51 (H) 0.50 - 1.10 mg/dL   Calcium 9.3 8.4 - 10.5 mg/dL   Total Protein 6.4 6.0 - 8.3 g/dL   Albumin 3.4 (L) 3.5 - 5.2 g/dL   AST 22 0 - 37 U/L   ALT 24 0 - 35 U/L   Alkaline Phosphatase 75 39 - 117 U/L   Total Bilirubin  0.5 0.3 - 1.2 mg/dL   GFR calc non Af Amer 36 (L) >90 mL/min   GFR calc Af Amer 42 (L) >90 mL/min    Comment: (NOTE) The eGFR has been calculated using the CKD EPI equation. This calculation has not been validated in all clinical situations. eGFR's persistently <90 mL/min signify possible Chronic Kidney Disease.    Anion gap 9 5 - 15  Glucose, capillary     Status: Abnormal   Collection Time: 02/21/15 11:37 AM  Result Value Ref Range   Glucose-Capillary 173 (H) 70 - 99 mg/dL  Glucose, capillary     Status: Abnormal   Collection Time: 02/21/15  4:57 PM  Result Value Ref Range   Glucose-Capillary 240 (H) 70 - 99 mg/dL  Glucose, capillary     Status: Abnormal   Collection Time: 02/21/15  9:12 PM  Result Value Ref Range   Glucose-Capillary 259 (H) 70 - 99 mg/dL  Glucose, capillary     Status: Abnormal   Collection Time: 02/22/15  6:59 AM  Result Value Ref Range   Glucose-Capillary 248 (H) 70 - 99 mg/dL  Glucose, capillary     Status: Abnormal   Collection Time: 02/22/15  11:27 AM  Result Value Ref Range   Glucose-Capillary 175 (H) 70 - 99 mg/dL   Comment 1 Notify RN   Glucose, capillary     Status: Abnormal   Collection Time: 02/22/15  4:23 PM  Result Value Ref Range   Glucose-Capillary 174 (H) 70 - 99 mg/dL   Comment 1 Notify RN   Glucose, capillary     Status: Abnormal   Collection Time: 02/22/15  8:56 PM  Result Value Ref Range   Glucose-Capillary 199 (H) 70 - 99 mg/dL  Glucose, capillary     Status: Abnormal   Collection Time: 02/23/15  7:00 AM  Result Value Ref Range   Glucose-Capillary 170 (H) 70 - 99 mg/dL     HEENT: poor dentition Cardio: RRR and no murmur Resp: CTA B/L and unlabored GI: BS positive and NT, ND Extremity:  Pulses positive and No Edema Skin:   Wound Right post leg 1.5cm gransulating no odor Neuro: Alert/Oriented, Cranial Nerve II-XII normal, Normal Sensory, Abnormal Motor 4-/5 R delt bi tri grip,4 L HF , KE ankle DF, R 5/5 except Right ankle limited by pertial foot amp and Abnormal FMC Ataxic/ dec FMC Musc/Skel:  Other Right trans met amp Gen NAD   Assessment/Plan: 1. Functional deficits secondary to right lenticular nucleus/corona radiate thrombotic infarct  which require 3+ hours per day of interdisciplinary therapy in a comprehensive inpatient rehab setting. Physiatrist is providing close team supervision and 24 hour management of active medical problems listed below. Physiatrist and rehab team continue to assess barriers to discharge/monitor patient progress toward functional and medical goals. FIM: FIM - Bathing Bathing Steps Patient Completed: Chest, Right Arm, Left Arm, Abdomen, Front perineal area, Buttocks, Right upper leg, Left upper leg, Right lower leg (including foot), Left lower leg (including foot) Bathing: 4: Steadying assist  FIM - Upper Body Dressing/Undressing Upper body dressing/undressing steps patient completed: Thread/unthread right sleeve of pullover shirt/dresss, Thread/unthread left sleeve of  pullover shirt/dress, Put head through opening of pull over shirt/dress Upper body dressing/undressing: 4: Min-Patient completed 75 plus % of tasks FIM - Lower Body Dressing/Undressing Lower body dressing/undressing steps patient completed: Thread/unthread right pants leg, Thread/unthread left pants leg, Don/Doff right sock, Don/Doff left sock, Don/Doff right shoe, Don/Doff left shoe, Fasten/unfasten right shoe, Fasten/unfasten left shoe  Lower body dressing/undressing: 4: Min-Patient completed 75 plus % of tasks  FIM - Toileting Toileting steps completed by patient: Performs perineal hygiene Toileting Assistive Devices: Grab bar or rail for support Toileting: 2: Max-Patient completed 1 of 3 steps  FIM - Radio producer Devices: Grab bars Toilet Transfers: 2-To toilet/BSC: Max A (lift and lower assist), 2-From toilet/BSC: Max A (lift and lower assist)  FIM - Control and instrumentation engineer Devices: Arm rests Bed/Chair Transfer: 2: Chair or W/C > Bed: Max A (lift and lower assist), 2: Bed > Chair or W/C: Max A (lift and lower assist)  FIM - Locomotion: Wheelchair Distance: 40 Locomotion: Wheelchair: 1: Total Assistance/staff pushes wheelchair (Pt<25%) FIM - Locomotion: Ambulation Locomotion: Ambulation Assistive Devices: Other (comment), Walker - Rolling (L hand orthosis, L AFO) Ambulation/Gait Assistance: 3: Mod assist Locomotion: Ambulation: 1: Travels less than 50 ft with maximal assistance (Pt: 25 - 49%)  Comprehension Comprehension Mode: Auditory Comprehension: 5-Understands complex 90% of the time/Cues < 10% of the time  Expression Expression Mode: Verbal Expression: 6-Expresses complex ideas: With extra time/assistive device  Social Interaction Social Interaction: 6-Interacts appropriately with others with medication or extra time (anti-anxiety, antidepressant).  Problem Solving Problem Solving: 5-Solves basic problems: With no  assist  Memory Memory: 4-Recognizes or recalls 75 - 89% of the time/requires cueing 10 - 24% of the time  Medical Problem List and Plan: 1. Functional deficits secondary to hypertensive right lenticular nucleus/corona radiate infarct 2. DVT Prophylaxis/Anticoagulation: Pharmaceutical: Lovenox 3. Pain Management: neurontin for peripheral neuropathy.  4. Bipolar disorder/Mood: Appears stable. Continue Lamictal bid. Klonopin as needed prn anxiety.  5. Neuropsych: This patient is capable of making decisions on her own behalf. 6. Skin/Wound Care: Continue santyl to ulcer on right shin. Routine pressure relief measures.  7. Fluids/Electrolytes/Nutrition: Monitor I/O.   8. DM type 2 with peripheral neuropathy : Monitor BS with ac/hs checks. Continue Trajenta, amaryl to bid and lantus with SSI for elevated BS.  9. CKD stage III: Monitor for stability. Labs stable.  10. Dyslipidemia: On Zocor. 11. HTN: Monitor BP every 8 hours. Continue norvasc and lisinopril.  12.  S/P  Right partial foot amp LOS (Days) 3 A FACE TO FACE EVALUATION WAS PERFORMED  Keishawn Darsey T 02/23/2015, 9:18 AM

## 2015-02-23 NOTE — Progress Notes (Signed)
Occupational Therapy Session Note  Patient Details  Name: Tara Hopkins MRN: [id number] Date of Birth: 05-06-1951  Today's Date: 02/23/2015 OT Individual Time: 2395-3202 OT Individual Time Calculation (min): 60 min    Short Term Goals: Week 1:  OT Short Term Goal 1 (Week 1): Pt will complete tub/shower transfer with min assist with LRAD OT Short Term Goal 2 (Week 1): Pt will complete toilet transfer with min assist with LRAD OT Short Term Goal 3 (Week 1): Pt will complete UB dressing with supervision OT Short Term Goal 4 (Week 1): Pt will complete LB dressing with min assist OT Short Term Goal 5 (Week 1): Pt will complete grooming tasks in standing with use of LUE as gross assist  Skilled Therapeutic Interventions/Progress Updates:  Upon entering the room, pt seated in recliner chair awaiting therapist with no c/o pain this session. Pt performing squat pivot with Mod A to wheelchair as pt requests shower this session. OT propelled wheelchair into bathroom for mod A shower transfer from wheelchair <> TTB. Pt performed bathing at shower level with steady assist for balance when standing to wash buttocks and peri area. Pt returning to wheelchair for grooming and dressing tasks at sink side. STS from wheelchair with Min - Mod A for LB clothing management. Pt actively observed to be utilizing L UE at diminished level for self care tasks. Pt returning to sit in recliner chair and therapist discussed HEP for [facility] exercises left by previous OT. Education regarding additional house hold and leisure tasks that would incorporate work on [facility] as well. PT verbalized understanding. Call bell and all needed items within reach upon exiting the room.      Therapy Documentation Precautions:  Precautions Precautions: Fall Precaution Comments: Rt mid foot amputation Restrictions Weight Bearing Restrictions: No Pain: Pain Assessment Pain Assessment: 0-10 Pain Score: 4  Pain Type: Acute pain Pain Location:  Head Pain Descriptors / Indicators: Aching Pain Intervention(s): Medication (See eMAR)  See FIM for current functional status  Therapy/Group: Individual Therapy  Phineas Semen 02/23/2015, 9:44 AM

## 2015-02-24 ENCOUNTER — Encounter (HOSPITAL_COMMUNITY): Payer: Self-pay | Admitting: Occupational Therapy

## 2015-02-24 ENCOUNTER — Ambulatory Visit (HOSPITAL_COMMUNITY): Payer: Self-pay | Admitting: Occupational Therapy

## 2015-02-24 LAB — GLUCOSE, CAPILLARY
GLUCOSE-CAPILLARY: 220 mg/dL — AB (ref 70–99)
Glucose-Capillary: 126 mg/dL — ABNORMAL HIGH (ref 70–99)
Glucose-Capillary: 199 mg/dL — ABNORMAL HIGH (ref 70–99)
Glucose-Capillary: 84 mg/dL (ref 70–99)

## 2015-02-24 NOTE — Progress Notes (Signed)
Occupational Therapy Session Note  Patient Details  Name: Tara Hopkins MRN: [id number] Date of Birth: 10/19/1951  Today's Date: 02/24/2015 OT Individual Time: 6384-5364 OT Individual Time Calculation (min): 45 min    Short Term Goals: Week 1:  OT Short Term Goal 1 (Week 1): Pt will complete tub/shower transfer with min assist with LRAD OT Short Term Goal 2 (Week 1): Pt will complete toilet transfer with min assist with LRAD OT Short Term Goal 3 (Week 1): Pt will complete UB dressing with supervision OT Short Term Goal 4 (Week 1): Pt will complete LB dressing with min assist OT Short Term Goal 5 (Week 1): Pt will complete grooming tasks in standing with use of LUE as gross assist  Skilled Therapeutic Interventions/Progress Updates:    Engaged in ADL retraining with focus on sit <> stand and education on hemi-dressing technique.  Pt received in recliner, reporting having a rough night with daughter upsetting her regarding discussion about care at d/c.  Provided moral support and reiterated focus of therapy with goals of overall supervision level.  Completed bathing in room shower with min assist for stand pivot transfer with use of grab bars and then min/steady assist for bathing when standing to wash buttocks.  Discussed having grab bars and hand held shower in bathroom at home.  Educated pt on hemi-dressing as she demonstrated difficulty with donning dress with pt return demonstrating and verbalizing understanding and pleasure with technique.  Engaged in sit <> stand with focus on increased anterior weight shift to achieve standing with decreased physical assist.  Stand pivot transfer back to recliner at end of session.  Therapy Documentation Precautions:  Precautions Precautions: Fall Precaution Comments: Rt mid foot amputation Restrictions Weight Bearing Restrictions: No Pain: Pain Assessment Pain Score: 0-No pain Faces Pain Scale: No hurt PAINAD (Pain Assessment in Advanced  Dementia) Breathing: normal  See FIM for current functional status  Therapy/Group: Individual Therapy  [name] 02/24/2015, 12:12 PM

## 2015-02-24 NOTE — Progress Notes (Signed)
64 y.o. RH-female with history of HTN, DM, peripheral neuropathy, anxiety disorder; who was admitted via EMS on 02/15/15 with acute onset of left sided weakness and slurred speech. Upon EMS evaluation BP 220/130 and CBG of 224. She was given labetalol 89m IV x 1 with improvement of BP to 167/71. UDS positive for cocaine. CT head negative and tPA administered. MRI/MRA brain was done revealing acute nonhemorrhagic infarcts in posterior aspect of right lenticular nucleus to posterior corona radiata and posterior limb of L-IC. 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 2D echo showed severe LVH, EF 55-60% and no regional wall abnormalities. BLE dopplers negative for DVT. Speech therapy evaluation reveals mild cognitive deficits in executive functioning and recall. 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   Subjective/Complaints: Slept like a log! Was very busy with therapy yesterday. No new complaints ROS - weakness Left side Objective: Vital Signs: Blood pressure 129/66, pulse 72, temperature 98.6 F (37 C), temperature source Oral, resp. rate 17, SpO2 99 %. No results found. Results for orders placed or performed during the hospital encounter of 02/20/15 (from the past 72 hour(s))  Glucose, capillary     Status: Abnormal   Collection Time: 02/21/15 11:37 AM  Result Value Ref Range   Glucose-Capillary 173 (H) 70 - 99 mg/dL  Glucose, capillary     Status: Abnormal   Collection Time: 02/21/15  4:57 PM  Result Value Ref Range   Glucose-Capillary 240 (H) 70 - 99 mg/dL  Glucose, capillary     Status: Abnormal   Collection Time: 02/21/15  9:12 PM  Result Value Ref Range   Glucose-Capillary 259 (H) 70 - 99 mg/dL  Glucose, capillary     Status: Abnormal   Collection Time: 02/22/15  6:59 AM  Result Value Ref Range   Glucose-Capillary 248 (H) 70 - 99 mg/dL  Glucose, capillary     Status: Abnormal   Collection Time: 02/22/15 11:27 AM   Result Value Ref Range   Glucose-Capillary 175 (H) 70 - 99 mg/dL   Comment 1 Notify RN   Glucose, capillary     Status: Abnormal   Collection Time: 02/22/15  4:23 PM  Result Value Ref Range   Glucose-Capillary 174 (H) 70 - 99 mg/dL   Comment 1 Notify RN   Glucose, capillary     Status: Abnormal   Collection Time: 02/22/15  8:56 PM  Result Value Ref Range   Glucose-Capillary 199 (H) 70 - 99 mg/dL  Glucose, capillary     Status: Abnormal   Collection Time: 02/23/15  7:00 AM  Result Value Ref Range   Glucose-Capillary 170 (H) 70 - 99 mg/dL  Glucose, capillary     Status: Abnormal   Collection Time: 02/23/15 11:40 AM  Result Value Ref Range   Glucose-Capillary 166 (H) 70 - 99 mg/dL   Comment 1 Notify RN   Glucose, capillary     Status: Abnormal   Collection Time: 02/23/15  5:01 PM  Result Value Ref Range   Glucose-Capillary 159 (H) 70 - 99 mg/dL  Glucose, capillary     Status: Abnormal   Collection Time: 02/23/15  8:49 PM  Result Value Ref Range   Glucose-Capillary 119 (H) 70 - 99 mg/dL  Glucose, capillary     Status: Abnormal   Collection Time: 02/24/15  7:12 AM  Result Value Ref Range   Glucose-Capillary 126 (H) 70 - 99 mg/dL     HEENT: poor dentition  Cardio: RRR and no murmur Resp: CTA B/L and unlabored GI: BS positive and NT, ND Extremity:  Pulses positive and No Edema Skin:   Wound Right post leg 1.5cm granulating area no odor Neuro: Alert/Oriented, Cranial Nerve II-XII normal, Normal Sensory, Abnormal Motor 4-/5 R delt bi tri grip,4 L HF , KE ankle DF, R 5/5 except Right ankle limited by pertial foot amp and Abnormal FMC Ataxic/ dec FMC Musc/Skel:  Other Right trans met amp Gen NAD   Assessment/Plan: 1. Functional deficits secondary to right lenticular nucleus/corona radiate thrombotic infarct  which require 3+ hours per day of interdisciplinary therapy in a comprehensive inpatient rehab setting. Physiatrist is providing close team supervision and 24 hour  management of active medical problems listed below. Physiatrist and rehab team continue to assess barriers to discharge/monitor patient progress toward functional and medical goals. FIM: FIM - Bathing Bathing Steps Patient Completed: Chest, Right Arm, Left Arm, Abdomen, Front perineal area, Buttocks, Right upper leg, Left upper leg, Right lower leg (including foot), Left lower leg (including foot) Bathing: 4: Steadying assist  FIM - Upper Body Dressing/Undressing Upper body dressing/undressing steps patient completed: Thread/unthread right sleeve of pullover shirt/dresss, Thread/unthread left sleeve of pullover shirt/dress, Put head through opening of pull over shirt/dress, Pull shirt over trunk Upper body dressing/undressing: 5: Set-up assist to: Obtain clothing/put away FIM - Lower Body Dressing/Undressing Lower body dressing/undressing steps patient completed: Thread/unthread right pants leg, Thread/unthread left pants leg, Don/Doff right sock, Don/Doff left sock, Don/Doff right shoe, Don/Doff left shoe, Fasten/unfasten right shoe, Fasten/unfasten left shoe Lower body dressing/undressing: 4: Min-Patient completed 75 plus % of tasks  FIM - Toileting Toileting steps completed by patient: Performs perineal hygiene Toileting Assistive Devices: Grab bar or rail for support Toileting: 2: Max-Patient completed 1 of 3 steps  FIM - Radio producer Devices: Grab bars Toilet Transfers: 2-To toilet/BSC: Max A (lift and lower assist), 2-From toilet/BSC: Max A (lift and lower assist)  FIM - Control and instrumentation engineer Devices: Arm rests Bed/Chair Transfer: 2: Chair or W/C > Bed: Max A (lift and lower assist), 3: Bed > Chair or W/C: Mod A (lift or lower assist) (recliner <> w/c)  FIM - Locomotion: Wheelchair Distance: 40 Locomotion: Wheelchair: 1: Total Assistance/staff pushes wheelchair (Pt<25%) FIM - Locomotion: Ambulation Locomotion: Ambulation  Assistive Devices: Other (comment) (R rail then hemi walker;; ACE bandage for L ankle dorsiflexion) Ambulation/Gait Assistance: 1: +2 Total assist, 3: Mod assist (+2A for w/c follow) Locomotion: Ambulation: 1: Two helpers  Comprehension Comprehension Mode: Auditory Comprehension: 5-Understands complex 90% of the time/Cues < 10% of the time  Expression Expression Mode: Verbal Expression: 5-Expresses complex 90% of the time/cues < 10% of the time  Social Interaction Social Interaction: 5-Interacts appropriately 90% of the time - Needs monitoring or encouragement for participation or interaction.  Problem Solving Problem Solving: 5-Solves complex 90% of the time/cues < 10% of the time  Memory Memory: 4-Recognizes or recalls 75 - 89% of the time/requires cueing 10 - 24% of the time  Medical Problem List and Plan: 1. Functional deficits secondary to hypertensive right lenticular nucleus/corona radiate infarct 2. DVT Prophylaxis/Anticoagulation: Pharmaceutical: Lovenox 3. Pain Management: neurontin for peripheral neuropathy.  4. Bipolar disorder/Mood: Appears stable. Continue Lamictal bid. Klonopin as needed prn anxiety.  5. Neuropsych: This patient is capable of making decisions on her own behalf. 6. Skin/Wound Care: Continue santyl to ulcer on right shin. Routine pressure relief measures.  7. Fluids/Electrolytes/Nutrition: Monitor I/O.   8. DM  type 2 with peripheral neuropathy : Monitor BS with ac/hs checks. Continue Trajenta, amaryl bid and lantus with SSI for elevated BS.   -sugars improving---continue with current regimen 9. CKD stage III: Monitor for stability. Labs stable.  10. Dyslipidemia: On Zocor. 11. HTN: Monitor BP every 8 hours. Continue norvasc and lisinopril.  12.  S/P  Right partial foot amp LOS (Days) 4 A FACE TO FACE EVALUATION WAS PERFORMED  Haylen Bellotti T 02/24/2015, 9:18 AM

## 2015-02-25 ENCOUNTER — Inpatient Hospital Stay (HOSPITAL_COMMUNITY): Payer: Self-pay | Admitting: Speech Pathology

## 2015-02-25 ENCOUNTER — Other Ambulatory Visit: Payer: Self-pay | Admitting: Family

## 2015-02-25 ENCOUNTER — Inpatient Hospital Stay (HOSPITAL_COMMUNITY): Payer: BC Managed Care – PPO | Admitting: Occupational Therapy

## 2015-02-25 ENCOUNTER — Inpatient Hospital Stay (HOSPITAL_COMMUNITY): Payer: BC Managed Care – PPO | Admitting: Physical Therapy

## 2015-02-25 LAB — GLUCOSE, CAPILLARY
GLUCOSE-CAPILLARY: 144 mg/dL — AB (ref 70–99)
Glucose-Capillary: 177 mg/dL — ABNORMAL HIGH (ref 70–99)
Glucose-Capillary: 228 mg/dL — ABNORMAL HIGH (ref 70–99)
Glucose-Capillary: 137 mg/dL — ABNORMAL HIGH (ref 70–99)

## 2015-02-25 NOTE — Progress Notes (Signed)
Physical Therapy Session Note  Patient Details  Name: Tara Hopkins MRN: [id number] Date of Birth: Jul 15, 1951  Today's Date: 02/25/2015 PT Individual Time: 1400-1500 PT Individual Time Calculation (min): 60 min   Short Term Goals: Week 1:  PT Short Term Goal 1 (Week 1): Pt will perform bed mobility mod I.  PT Short Term Goal 2 (Week 1): Pt will perform bed<>w/c transfer with min A and 25% cues from PT.  PT Short Term Goal 3 (Week 1): Pt will propel w/c 100' with mod A and 50% cues from PT.  PT Short Term Goal 4 (Week 1): Pt will ambulate 3' with mod A and LRAD with 50% cues from PT  Skilled Therapeutic Interventions/Progress Updates:    Pt received seated in recliner; agreeable to therapy. Session focused on gait training using LiteGait. Pt performed stand pivot transfers from recliner <> w/c with mod A; level squat pivot transfers from w/c <> mat table with min A, 75% cueing for setup. Transported pt to ortho gym in w/c with total A for energy conservation. Pt performed dynamic standing balance with min guard with bilat UE support while donning LiteGait harness. Placed heel wedge in L shoe to decrease L genu recurvatum. Remainder of session focused on pre-gait and gait training using LiteGait over treadmill. LiteGait harness biased to decrease forward flexion and increase lateral weight shift to R side. During gait training, pt required manual facilitation of lateral weight shift (bilat directions), and manual facilitation of increased step length/clearance bilaterally due to short, shuffling steps. Pt was initially fearful of ambulating using LiteGait system but became increasingly more comfortable.   Gait training ended secondary to pt c/o discomfort in L foot (in area of transmetatarsal amputation) and increased lightheadedness. Repositioned pt safely into seated. Vitals noted to be WNL; see vital signs for detailed readings. Upon inspection of R foot, noted skin to be intact with no  erythema. Session ended in pt room, where pt was left seated in recliner with bilat LE's elevated, call bell within reach, and pt in no apparent distress. RN notified of lightheadedness, which pt did continue to report at end of this session.  Therapy Documentation Precautions:  Precautions Precautions: Fall Precaution Comments: Rt mid foot amputation Restrictions Weight Bearing Restrictions: No Vital Signs: Therapy Vitals Temp: 98.3 F (36.8 C) Temp Source: Oral Pulse Rate: 87 BP: (!) 136/56 mmHg Patient Position (if appropriate): Sitting Oxygen Therapy SpO2: 98 % O2 Device: Not Delivered Pain: Pain Assessment Pain Assessment: No/denies pain Pain Score: 0-No pain Locomotion : Ambulation Ambulation/Gait Assistance: 1: +1 Total assist  Balance: Standardized Balance Assessment Standardized Balance Assessment: Berg Balance Test Berg Balance Test Sit to Stand: Needs minimal aid to stand or to stabilize Standing Unsupported: Unable to stand 30 seconds unassisted Sitting with Back Unsupported but Feet Supported on Floor or Stool: Able to sit safely and securely 2 minutes Stand to Sit: Needs assistance to sit Transfers: Needs one person to assist  See FIM for current functional status  Therapy/Group: Individual Therapy  Dorlis Judice, Malva Cogan 02/25/2015, 8:00 PM

## 2015-02-25 NOTE — Progress Notes (Signed)
Speech Language Pathology Daily Session Note  Patient Details  Name: Tara Hopkins MRN: [id number] Date of Birth: 01-20-1951  Today's Date: 02/25/2015 SLP Individual Time: 1035-1130 SLP Individual Time Calculation (min): 55 min  Short Term Goals: Week 1: SLP Short Term Goal 1 (Week 1): Pt will recall at least 2 details of new information using external aids with functional daily living tasks with supervision. SLP Short Term Goal 2 (Week 1): Pt will demonstrate appropriate safety awareness to complex situations and/or tasks with functional tasks with supervision. SLP Short Term Goal 3 (Week 1): Pt will demonstrate appropriate executive function skills to participate in money/medication management functional tasks with supervision.  Skilled Therapeutic Interventions:  Pt was seen for skilled ST targeting cognitive goals.  Upon arrival, pt was seated upright in recliner, awake, alert, and agreeable to participate in [place].  Pt endorses that she is altered from her cognitive baseline but required min question cues to recall current goals of ST.  SLP reviewed and reinforced current goals and rationale behind skilled ST services while inpatient in maximizing functional independence and reducing burden of care prior to discharge.  Per pt, pt's goddaughter will be staying with her 24/7 after discharge until June, at which point her son will be home from jail and will take over her care.  SLP facilitated the session with a structured new learning activity targeting planning, mental flexibility, and error awareness.  Pt generated and utilized a problem solving strategy with mod faded to min assist verbal cues.  Upon completion of task, pt appropriately initiated a request to use the restroom but required mod-max verbal and visual cues for safety due to impulsivity.  Pt left in recliner with all needs within reach.  Continue per current plan of care.    FIM:  Comprehension Comprehension Mode:  Auditory Comprehension: 5-Understands complex 90% of the time/Cues < 10% of the time Expression Expression Mode: Verbal Expression: 5-Expresses complex 90% of the time/cues < 10% of the time Social Interaction Social Interaction: 5-Interacts appropriately 90% of the time - Needs monitoring or encouragement for participation or interaction. Problem Solving Problem Solving: 5-Solves complex 90% of the time/cues < 10% of the time Memory Memory: 4-Recognizes or recalls 75 - 89% of the time/requires cueing 10 - 24% of the time  Pain Pain Assessment Pain Assessment: No/denies pain Pain Score: 0-No pain  Therapy/Group: Individual Therapy  Lora Chavers, Selinda Orion 02/25/2015, 1:21 PM

## 2015-02-25 NOTE — Progress Notes (Signed)
64 y.o. RH-female with history of HTN, DM, peripheral neuropathy, anxiety disorder; who was admitted via EMS on 02/15/15 with acute onset of left sided weakness and slurred speech. Upon EMS evaluation BP 220/130 and CBG of 224. She was given labetalol 43m IV x 1 with improvement of BP to 167/71. UDS positive for cocaine. CT head negative and tPA administered. MRI/MRA brain was done revealing acute nonhemorrhagic infarcts in posterior aspect of right lenticular nucleus to posterior corona radiata and posterior limb of L-IC. 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 2D echo showed severe LVH, EF 55-60% and no regional wall abnormalities. BLE dopplers negative for DVT. Speech therapy evaluation reveals mild cognitive deficits in executive functioning and recall. 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   Subjective/Complaints: Fell at bedside trying to transfer chair to bed, no injury, per RN actually lowered herself to floor ROS - weakness Left side Objective: Vital Signs: Blood pressure 103/89, pulse 76, temperature 98.2 F (36.8 C), temperature source Oral, resp. rate 18, SpO2 99 %. No results found. Results for orders placed or performed during the hospital encounter of 02/20/15 (from the past 72 hour(s))  Glucose, capillary     Status: Abnormal   Collection Time: 02/22/15 11:27 AM  Result Value Ref Range   Glucose-Capillary 175 (H) 70 - 99 mg/dL   Comment 1 Notify RN   Glucose, capillary     Status: Abnormal   Collection Time: 02/22/15  4:23 PM  Result Value Ref Range   Glucose-Capillary 174 (H) 70 - 99 mg/dL   Comment 1 Notify RN   Glucose, capillary     Status: Abnormal   Collection Time: 02/22/15  8:56 PM  Result Value Ref Range   Glucose-Capillary 199 (H) 70 - 99 mg/dL  Glucose, capillary     Status: Abnormal   Collection Time: 02/23/15  7:00 AM  Result Value Ref Range   Glucose-Capillary 170 (H) 70 - 99 mg/dL   Glucose, capillary     Status: Abnormal   Collection Time: 02/23/15 11:40 AM  Result Value Ref Range   Glucose-Capillary 166 (H) 70 - 99 mg/dL   Comment 1 Notify RN   Glucose, capillary     Status: Abnormal   Collection Time: 02/23/15  5:01 PM  Result Value Ref Range   Glucose-Capillary 159 (H) 70 - 99 mg/dL  Glucose, capillary     Status: Abnormal   Collection Time: 02/23/15  8:49 PM  Result Value Ref Range   Glucose-Capillary 119 (H) 70 - 99 mg/dL  Glucose, capillary     Status: Abnormal   Collection Time: 02/24/15  7:12 AM  Result Value Ref Range   Glucose-Capillary 126 (H) 70 - 99 mg/dL  Glucose, capillary     Status: Abnormal   Collection Time: 02/24/15 11:52 AM  Result Value Ref Range   Glucose-Capillary 199 (H) 70 - 99 mg/dL  Glucose, capillary     Status: Abnormal   Collection Time: 02/24/15  4:58 PM  Result Value Ref Range   Glucose-Capillary 220 (H) 70 - 99 mg/dL  Glucose, capillary     Status: None   Collection Time: 02/24/15  9:09 PM  Result Value Ref Range   Glucose-Capillary 84 70 - 99 mg/dL  Glucose, capillary     Status: Abnormal   Collection Time: 02/25/15  6:48 AM  Result Value Ref Range   Glucose-Capillary 177 (H) 70 - 99 mg/dL  HEENT: poor dentition Cardio: RRR and no murmur Resp: CTA B/L and unlabored GI: BS positive and NT, ND Extremity:  Pulses positive and No Edema Skin:   Wound Right post leg 1.5cm granulating area no odor Neuro: Alert/Oriented, Cranial Nerve II-XII normal, Normal Sensory, Abnormal Motor 4-/5 R delt bi tri grip,4 L HF , KE ankle DF, R 5/5 except Right ankle limited by pertial foot amp and Abnormal FMC Ataxic/ dec FMC Musc/Skel:  Other Right trans met amp Gen NAD   Assessment/Plan: 1. Functional deficits secondary to right lenticular nucleus/corona radiate thrombotic infarct  which require 3+ hours per day of interdisciplinary therapy in a comprehensive inpatient rehab setting. Physiatrist is providing close team  supervision and 24 hour management of active medical problems listed below. Physiatrist and rehab team continue to assess barriers to discharge/monitor patient progress toward functional and medical goals. FIM: FIM - Bathing Bathing Steps Patient Completed: Chest, Right Arm, Left Arm, Abdomen, Front perineal area, Buttocks, Right upper leg, Left upper leg, Right lower leg (including foot), Left lower leg (including foot) Bathing: 4: Steadying assist  FIM - Upper Body Dressing/Undressing Upper body dressing/undressing steps patient completed: Thread/unthread right sleeve of pullover shirt/dresss, Thread/unthread left sleeve of pullover shirt/dress, Put head through opening of pull over shirt/dress, Pull shirt over trunk Upper body dressing/undressing: 5: Supervision: Safety issues/verbal cues FIM - Lower Body Dressing/Undressing Lower body dressing/undressing steps patient completed: Don/Doff right sock, Don/Doff left sock, Don/Doff right shoe, Don/Doff left shoe, Fasten/unfasten right shoe, Fasten/unfasten left shoe Lower body dressing/undressing: 5: Set-up assist to: Obtain clothing  FIM - Toileting Toileting steps completed by patient: Performs perineal hygiene Toileting Assistive Devices: Grab bar or rail for support Toileting: 2: Max-Patient completed 1 of 3 steps  FIM - Radio producer Devices: Grab bars Toilet Transfers: 2-To toilet/BSC: Max A (lift and lower assist), 2-From toilet/BSC: Max A (lift and lower assist)  FIM - Control and instrumentation engineer Devices: Arm rests Bed/Chair Transfer: 2: Chair or W/C > Bed: Max A (lift and lower assist), 3: Bed > Chair or W/C: Mod A (lift or lower assist) (recliner <> w/c)  FIM - Locomotion: Wheelchair Distance: 40 Locomotion: Wheelchair: 1: Total Assistance/staff pushes wheelchair (Pt<25%) FIM - Locomotion: Ambulation Locomotion: Ambulation Assistive Devices: Other (comment) (R rail then hemi  walker;; ACE bandage for L ankle dorsiflexion) Ambulation/Gait Assistance: 1: +2 Total assist, 3: Mod assist (+2A for w/c follow) Locomotion: Ambulation: 1: Two helpers  Comprehension Comprehension Mode: Auditory Comprehension: 5-Understands complex 90% of the time/Cues < 10% of the time  Expression Expression Mode: Verbal Expression: 5-Expresses complex 90% of the time/cues < 10% of the time  Social Interaction Social Interaction: 5-Interacts appropriately 90% of the time - Needs monitoring or encouragement for participation or interaction.  Problem Solving Problem Solving: 5-Solves complex 90% of the time/cues < 10% of the time  Memory Memory: 4-Recognizes or recalls 75 - 89% of the time/requires cueing 10 - 24% of the time  Medical Problem List and Plan: 1. Functional deficits secondary to hypertensive right lenticular nucleus/corona radiate infarct 2. DVT Prophylaxis/Anticoagulation: Pharmaceutical: Lovenox 3. Pain Management: neurontin for peripheral neuropathy.  4. Bipolar disorder/Mood: Appears stable. Continue Lamictal bid. Klonopin as needed prn anxiety.  5. Neuropsych: This patient is capable of making decisions on her own behalf. 6. Skin/Wound Care: Continue santyl to ulcer on right shin. Routine pressure relief measures.  7. Fluids/Electrolytes/Nutrition: Monitor I/O.   8. DM type 2 with peripheral neuropathy : Monitor BS with  ac/hs checks. Continue Trajenta, amaryl bid and lantus with SSI for elevated BS.   -sugars improving---continue with current regimen 9. CKD stage III: Monitor for stability. Labs stable.  10. Dyslipidemia: On Zocor. 11. HTN: Monitor BP every 8 hours. Continue norvasc and lisinopril.  12.  S/P  Right partial foot amp LOS (Days) 5 A FACE TO FACE EVALUATION WAS PERFORMED  KIRSTEINS,ANDREW E 02/25/2015, 8:12 AM

## 2015-02-25 NOTE — Progress Notes (Signed)
Occupational Therapy Session Note  Patient Details  Name: Tara Hopkins MRN: [id number] Date of Birth: 04/16/1951  Today's Date: 02/25/2015 OT Individual Time: 9702-6378 and 1300-1330 OT Individual Time Calculation (min): 60 min and 30 min    Short Term Goals: Week 1:  OT Short Term Goal 1 (Week 1): Pt will complete tub/shower transfer with min assist with LRAD OT Short Term Goal 2 (Week 1): Pt will complete toilet transfer with min assist with LRAD OT Short Term Goal 3 (Week 1): Pt will complete UB dressing with supervision OT Short Term Goal 4 (Week 1): Pt will complete LB dressing with min assist OT Short Term Goal 5 (Week 1): Pt will complete grooming tasks in standing with use of LUE as gross assist  Skilled Therapeutic Interventions/Progress Updates:    1) Engaged in ADL retraining with focus on functional transfers, sit > stand, standing balance, and carryover of hemi-dressing technique.  Upon arrival, pt reports need to toilet.  Performed stand pivot transfer bed > w/c with mod assist with cues for sequencing, then mod assist w/c to toilet.  Pt ambulated approx 5 steps from toilet to shower chair with mod assist with cues for sequencing.  Bathing completed at sit > stand level in room shower with use of grab bars for steady assist in standing.  Pt with appropriate recall of hemi-dressing education with increased ability to don shirt and pants, requiring only steady assist when standing to pull pants over hips.  Discussed alternative ways to tie shoes as pt reports difficulty with fastening shoes, however most successful with crossing leg over opposite knee.  Pt required frequent rest breaks and redirection due to frustration with family member and assist post d/c as well as fall from when she attempted to transfer OOB independently.    2) Engaged in therapeutic activity with focus on squat pivot transfers and sit > stand.  Pt reports frustration with transfers and wanting to increase  independence.  Performed block practice of squat pivot transfers to increase independence and safety as pt demonstrates difficulty with anterior weight shift and trunk control with complete stand.  Continues to require mod assist with squat pivot transfers due to requiring assist to lift, but demonstrating increased control and coordination with transfer with this method.  Engaged in sit > stand with use of arm chair placed in front of pt to facilitate forward weight shift to assist with lift off of buttocks.  In standing engaged in lateral weight shift with manual facilitation at hips to accept weight through LLE and providing blocking at Lt knee due to buckling and hyperextension.  Pt verbalized frustration with BLE weakness in standing, therapist notified PT of knee buckling/hyperextension plan to attempt heel wedge during PT session.    Therapy Documentation Precautions:  Precautions Precautions: Fall Precaution Comments: Rt mid foot amputation Restrictions Weight Bearing Restrictions: No General:   Vital Signs: Therapy Vitals Temp: 98.3 F (36.8 C) Temp Source: Oral Pulse Rate: 87 BP: (!) 145/71 mmHg Patient Position (if appropriate): Sitting Oxygen Therapy SpO2: 97 % O2 Device: Not Delivered Pain: Pain Assessment Pain Assessment: No/denies pain Pain Score: 0-No pain  See FIM for current functional status  Therapy/Group: Individual Therapy  [name] 02/25/2015, 2:49 PM

## 2015-02-25 NOTE — Progress Notes (Signed)
Recreational Therapy Assessment and Plan  Patient Details  Name: Tara Hopkins MRN: [id number] Date of Birth: 07-Aug-1951 Today's Date: 02/25/2015  Rehab Potential: Good ELOS: 2 weeks   Assessment Clinical Impression: Problem List:  Patient Active Problem List   Diagnosis Date Noted  . CVA (cerebral infarction) 02/20/2015  . Left hemiparesis 02/20/2015  . Cocaine abuse   . Stroke 02/15/2015  . 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 Osteomyelitis of foot, right, acute 02/23/2014  . 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 Diabetic foot infection with ulcer and abcess, right 05/21/2013  . DM type 2 with diabetic peripheral neuropathy 05/21/2013  . Benign hypertension 05/21/2013  . Depression 05/21/2013  . Hypokalemia 05/21/2013  . Hyponatremia 05/21/2013  . Anemia 05/21/2013  . Renal insufficiency 05/21/2013    Past Medical History:  Past Medical History  Diagnosis Date  . Diabetes mellitus without complication   . Hypercholesteremia   . Peripheral neuropathy   . Hypertension     not on medications any longer  . Anxiety   . Depression   . History of blood transfusion   . History of hyperbaric oxygen therapy     pt reports 80 treatments.  . Anemia   . Glaucoma    Past Surgical History:  Past Surgical History  Procedure Laterality Date  . I&d extremity Right 05/23/2013    Procedure: IRRIGATION AND DEBRIDEMENT RIGHT GREAT TOE; Surgeon: Mauri Pole, MD; Location: [facility]; Service: Orthopedics; Laterality: Right;  . Amputation Right 12/15/2013    Procedure: Right Transmetatarsal Amputation; Surgeon: Newt Minion, MD; Location: [facility]; Service: Orthopedics; Laterality: Right;  . Amputation Right 02/23/2014    Procedure: AMPUTATION FOOT; Surgeon: Newt Minion, MD; Location: [facility]; Service:  Orthopedics; Laterality: Right; Right Foot Revision Transmetatarsal Amputation    Assessment & Plan Clinical Impression: Patient is a 64 y.o. right handed female with history of HTN, DM, peripheral neuropathy, anxiety disorder; who was admitted via EMS on 02/15/15 with acute onset of left sided weakness and slurred speech. Upon EMS evaluation BP 220/130 and CBG of 224. She was given labetalol 51m IV x 1 with improvement of BP to 167/71. UDS positive for cocaine. CT head negative and tPA administered. MRI/MRA brain was done revealing acute nonhemorrhagic infarcts in posterior aspect of right lenticular nucleus to posterior corona radiata and posterior limb of L-IC. 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 2D echo showed severe LVH, EF 55-60% and no regional wall abnormalities. BLE dopplers negative for DVT. Speech therapy evaluation reveals mild cognitive deficits in executive functioning and recall. 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 Patient with resultant left hemiparesis superimposed on chronic gait disorder and CIR was recommended by MD and rehab team.   [facility].       Pt presents with decreased activity tolerance, decreased functional mobility, decreased balance, decreased coordination Limiting pt's independence with leisure/community pursuits.   Leisure History/Participation Premorbid leisure interest/current participation: [place];Sports - Exercise (Comment);Crafts - Painting;Community - SPsychologist, forensicNPetra [place]- [employer](walking) Other Leisure Interests: Television;Movies;Reading;Computer;Cooking/Baking;Housework Leisure Participation Style: With Family/Friends Awareness of Community Resources: Excellent Psychosocial / Spiritual Spiritual Interests: Church;Womens'Men's Groups Patient agreeable to Pet Therapy: Yes Does patient have pets?:  No Social interaction - Mood/Behavior: Cooperative CEngineer, drillingfor Education?: Yes Recreational Therapy Orientation Orientation -Reviewed with patient: Available activity resources Strengths/Weaknesses Patient Strengths/Abilities:  Willingness to participate;Active premorbidly Patient weaknesses: Physical limitations TR Patient demonstrates impairments in the following area(s): Endurance;Motor;Safety  Plan Rec Therapy Plan Is patient appropriate for Therapeutic Recreation?: Yes Rehab Potential: Good Treatment times per week: Min 1 time per week > 20 minutes Estimated Length of Stay: 2 weeks TR Treatment/Interventions: Adaptive equipment instruction;1:1 session;Balance/vestibular training;Functional mobility training;Community reintegration;Cognitive remediation/compensation;Patient/family education;Therapeutic activities;Recreation/leisure participation;Therapeutic exercise;UE/LE Coordination activities  Recommendations for other services: None  Discharge Criteria: Patient will be discharged from TR if patient refuses treatment 3 consecutive times without medical reason.  If treatment goals not met, if there is a change in medical status, if patient makes no progress towards goals or if patient is discharged from hospital.  The above assessment, treatment plan, treatment alternatives and goals were discussed and mutually agreed upon: by patient  [place] 02/25/2015, 10:22 AM

## 2015-02-25 NOTE — Progress Notes (Signed)
Staff called to patient's room, patient tried to get out of bed without assistance and  Lowered herself to the floor when her knees buckled. No s/s of injury noted and patient stated that she was not injured, had no pain.

## 2015-02-25 NOTE — Progress Notes (Signed)
Social Work Patient ID: Tara Hopkins, female   DOB: 11/28/1950, 64 y.o.   MRN: [id number] Met with pt to go over her HCPOA, she wants to change it.  Gave her a new one and discussed to complete and then will get notorary once two witnesses set up. She will complete tonight and this worker will see tomorrow to set up completion.

## 2015-02-26 ENCOUNTER — Inpatient Hospital Stay (HOSPITAL_COMMUNITY): Payer: BC Managed Care – PPO

## 2015-02-26 ENCOUNTER — Inpatient Hospital Stay (HOSPITAL_COMMUNITY): Payer: Self-pay | Admitting: Occupational Therapy

## 2015-02-26 ENCOUNTER — Inpatient Hospital Stay (HOSPITAL_COMMUNITY): Payer: Self-pay | Admitting: Rehabilitation

## 2015-02-26 ENCOUNTER — Inpatient Hospital Stay (HOSPITAL_COMMUNITY): Payer: Self-pay | Admitting: Speech Pathology

## 2015-02-26 LAB — BASIC METABOLIC PANEL
ANION GAP: 12 (ref 5–15)
BUN: 48 mg/dL — ABNORMAL HIGH (ref 6–23)
CALCIUM: 9.8 mg/dL (ref 8.4–10.5)
CHLORIDE: 105 mmol/L (ref 96–112)
CO2: 22 mmol/L (ref 19–32)
Creatinine, Ser: 2 mg/dL — ABNORMAL HIGH (ref 0.50–1.10)
GFR calc Af Amer: 30 mL/min — ABNORMAL LOW (ref >90)
GFR calc non Af Amer: 26 mL/min — ABNORMAL LOW (ref >90)
Glucose, Bld: 164 mg/dL — ABNORMAL HIGH (ref 70–99)
Potassium: 4.6 mmol/L (ref 3.5–5.1)
Sodium: 139 mmol/L (ref 135–145)

## 2015-02-26 LAB — CBC WITH DIFFERENTIAL/PLATELET
BASOS PCT: 0 % (ref 0–1)
Basophils Absolute: 0 10*3/uL (ref 0.0–0.1)
Eosinophils Absolute: 0.1 10*3/uL (ref 0.0–0.7)
Eosinophils Relative: 1 % (ref 0–5)
HCT: 35.4 % — ABNORMAL LOW (ref 36.0–46.0)
Hemoglobin: 11.4 g/dL — ABNORMAL LOW (ref 12.0–15.0)
LYMPHS PCT: 19 % (ref 12–46)
Lymphs Abs: 1.8 10*3/uL (ref 0.7–4.0)
MCH: 28.9 pg (ref 26.0–34.0)
MCHC: 32.2 g/dL (ref 30.0–36.0)
MCV: 89.6 fL (ref 78.0–100.0)
Monocytes Absolute: 0.7 10*3/uL (ref 0.1–1.0)
Monocytes Relative: 7 % (ref 3–12)
NEUTROS ABS: 6.6 10*3/uL (ref 1.7–7.7)
NEUTROS PCT: 73 % (ref 43–77)
Platelets: 365 10*3/uL (ref 150–400)
RBC: 3.95 MIL/uL (ref 3.87–5.11)
RDW: 13.9 % (ref 11.5–15.5)
WBC: 9.2 10*3/uL (ref 4.0–10.5)

## 2015-02-26 LAB — GLUCOSE, CAPILLARY
GLUCOSE-CAPILLARY: 168 mg/dL — AB (ref 70–99)
GLUCOSE-CAPILLARY: 63 mg/dL — AB (ref 70–99)
GLUCOSE-CAPILLARY: 68 mg/dL — AB (ref 70–99)
GLUCOSE-CAPILLARY: 80 mg/dL (ref 70–99)
Glucose-Capillary: 139 mg/dL — ABNORMAL HIGH (ref 70–99)
Glucose-Capillary: 51 mg/dL — ABNORMAL LOW (ref 70–99)
Glucose-Capillary: 70 mg/dL (ref 70–99)

## 2015-02-26 LAB — CLOSTRIDIUM DIFFICILE BY PCR: Toxigenic C. Difficile by PCR: NEGATIVE

## 2015-02-26 MED ORDER — SODIUM CHLORIDE 0.45 % IV SOLN
INTRAVENOUS | Status: DC
  Administered 2015-02-26: 75 mL/h via INTRAVENOUS
  Administered 2015-02-27 – 2015-02-28 (×3): via INTRAVENOUS

## 2015-02-26 NOTE — Progress Notes (Signed)
SLP Cancellation Note  Patient Details Name: Tara Hopkins MRN: [id number] DOB: 1950-11-19   Cancelled treatment:        Pt missed 60 minutes of skilled ST treatment session due to medical hold. Pt with diarrhea, nausea and vomiting overnight. Will continue efforts as able.                                                                                            Dylin Ihnen B. [place], [facility], [place]    Shonna Chock 02/26/2015, 4:46 PM

## 2015-02-26 NOTE — Progress Notes (Signed)
Occupational Therapy Note  Patient Details  Name: Tara Hopkins MRN: [id number] Date of Birth: Feb 11, 1951  Today's Date: 02/26/2015 OT Missed Time: 90 Minutes Missed Time Reason: MD hold (comment)  Pt missed 90 mins scheduled skilled OT treatment session secondary to medical hold.  Pt with diarrhea, nausea, and vomiting over night.  RN in room reporting checking for c-diff and GI issues.  Will follow up as able.  [name] 02/26/2015, 10:50 AM

## 2015-02-26 NOTE — Progress Notes (Signed)
64 y.o. RH-female with history of HTN, DM, peripheral neuropathy, anxiety disorder; who was admitted via EMS on 02/15/15 with acute onset of left sided weakness and slurred speech. Upon EMS evaluation BP 220/130 and CBG of 224. She was given labetalol 43m IV x 1 with improvement of BP to 167/71. UDS positive for cocaine. CT head negative and tPA administered. MRI/MRA brain was done revealing acute nonhemorrhagic infarcts in posterior aspect of right lenticular nucleus to posterior corona radiata and posterior limb of L-IC. 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 2D echo showed severe LVH, EF 55-60% and no regional wall abnormalities. BLE dopplers negative for DVT. Speech therapy evaluation reveals mild cognitive deficits in executive functioning and recall. 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   Subjective/Complaints: Developed diarrhea yesterday, some nausea and vomiting as well. No abd pain.  No prior similar episodes Poor intake ROS - weakness Left side Objective: Vital Signs: Blood pressure 128/68, pulse 85, temperature 98.3 F (36.8 C), temperature source Oral, resp. rate 18, SpO2 95 %. Dg Abd 1 View  02/26/2015   CLINICAL DATA:  Diarrhea  EXAM: ABDOMEN - 1 VIEW  COMPARISON:  None.  FINDINGS: Nonobstructive bowel gas pattern. No free air on this supine image. No organomegaly or suspicious calcification. Calcified phleboliths in the pelvis.  Degenerative changes in the lower lumbar spine and hips. No acute bony abnormality.  IMPRESSION: No acute findings.   Electronically Signed   By: KRolm BaptiseM.D.   On: 02/26/2015 08:12   Results for orders placed or performed during the hospital encounter of 02/20/15 (from the past 72 hour(s))  Glucose, capillary     Status: Abnormal   Collection Time: 02/23/15 11:40 AM  Result Value Ref Range   Glucose-Capillary 166 (H) 70 - 99 mg/dL   Comment 1 Notify RN   Glucose, capillary      Status: Abnormal   Collection Time: 02/23/15  5:01 PM  Result Value Ref Range   Glucose-Capillary 159 (H) 70 - 99 mg/dL  Glucose, capillary     Status: Abnormal   Collection Time: 02/23/15  8:49 PM  Result Value Ref Range   Glucose-Capillary 119 (H) 70 - 99 mg/dL  Glucose, capillary     Status: Abnormal   Collection Time: 02/24/15  7:12 AM  Result Value Ref Range   Glucose-Capillary 126 (H) 70 - 99 mg/dL  Glucose, capillary     Status: Abnormal   Collection Time: 02/24/15 11:52 AM  Result Value Ref Range   Glucose-Capillary 199 (H) 70 - 99 mg/dL  Glucose, capillary     Status: Abnormal   Collection Time: 02/24/15  4:58 PM  Result Value Ref Range   Glucose-Capillary 220 (H) 70 - 99 mg/dL  Glucose, capillary     Status: None   Collection Time: 02/24/15  9:09 PM  Result Value Ref Range   Glucose-Capillary 84 70 - 99 mg/dL  Glucose, capillary     Status: Abnormal   Collection Time: 02/25/15  6:48 AM  Result Value Ref Range   Glucose-Capillary 177 (H) 70 - 99 mg/dL  Glucose, capillary     Status: Abnormal   Collection Time: 02/25/15 11:32 AM  Result Value Ref Range   Glucose-Capillary 144 (H) 70 - 99 mg/dL   Comment 1 Notify RN   Glucose, capillary     Status: Abnormal   Collection Time: 02/25/15  4:20 PM  Result Value Ref Range  Glucose-Capillary 228 (H) 70 - 99 mg/dL   Comment 1 Notify RN   Glucose, capillary     Status: Abnormal   Collection Time: 02/25/15  8:58 PM  Result Value Ref Range   Glucose-Capillary 137 (H) 70 - 99 mg/dL  Clostridium Difficile by PCR     Status: None   Collection Time: 02/25/15  9:28 PM  Result Value Ref Range   C difficile by pcr NEGATIVE NEGATIVE  Basic metabolic panel     Status: Abnormal   Collection Time: 02/26/15  6:34 AM  Result Value Ref Range   Sodium 139 135 - 145 mmol/L   Potassium 4.6 3.5 - 5.1 mmol/L   Chloride 105 96 - 112 mmol/L   CO2 22 19 - 32 mmol/L   Glucose, Bld 164 (H) 70 - 99 mg/dL   BUN 48 (H) 6 - 23 mg/dL    Creatinine, Ser 2.00 (H) 0.50 - 1.10 mg/dL   Calcium 9.8 8.4 - 10.5 mg/dL   GFR calc non Af Amer 26 (L) >90 mL/min   GFR calc Af Amer 30 (L) >90 mL/min    Comment: (NOTE) The eGFR has been calculated using the CKD EPI equation. This calculation has not been validated in all clinical situations. eGFR's persistently <90 mL/min signify possible Chronic Kidney Disease.    Anion gap 12 5 - 15  CBC with Differential/Platelet     Status: Abnormal   Collection Time: 02/26/15  6:34 AM  Result Value Ref Range   WBC 9.2 4.0 - 10.5 K/uL   RBC 3.95 3.87 - 5.11 MIL/uL   Hemoglobin 11.4 (L) 12.0 - 15.0 g/dL   HCT 35.4 (L) 36.0 - 46.0 %   MCV 89.6 78.0 - 100.0 fL   MCH 28.9 26.0 - 34.0 pg   MCHC 32.2 30.0 - 36.0 g/dL   RDW 13.9 11.5 - 15.5 %   Platelets 365 150 - 400 K/uL   Neutrophils Relative % 73 43 - 77 %   Neutro Abs 6.6 1.7 - 7.7 K/uL   Lymphocytes Relative 19 12 - 46 %   Lymphs Abs 1.8 0.7 - 4.0 K/uL   Monocytes Relative 7 3 - 12 %   Monocytes Absolute 0.7 0.1 - 1.0 K/uL   Eosinophils Relative 1 0 - 5 %   Eosinophils Absolute 0.1 0.0 - 0.7 K/uL   Basophils Relative 0 0 - 1 %   Basophils Absolute 0.0 0.0 - 0.1 K/uL  Glucose, capillary     Status: Abnormal   Collection Time: 02/26/15  7:50 AM  Result Value Ref Range   Glucose-Capillary 139 (H) 70 - 99 mg/dL     HEENT: poor dentition Cardio: RRR and no murmur Resp: CTA B/L and unlabored GI: BS positive and NT, ND Extremity:  Pulses positive and No Edema Skin:   Wound Right post leg 1.5cm granulating area no odor Neuro: Alert/Oriented, Cranial Nerve II-XII normal, Normal Sensory, Abnormal Motor 4-/5 R delt bi tri grip,4 L HF , KE ankle DF, R 5/5 except Right ankle limited by pertial foot amp and Abnormal FMC Ataxic/ dec FMC Musc/Skel:  Other Right trans met amp Gen NAD   Assessment/Plan: 1. Functional deficits secondary to right lenticular nucleus/corona radiate thrombotic infarct  which require 3+ hours per day of  interdisciplinary therapy in a comprehensive inpatient rehab setting. Physiatrist is providing close team supervision and 24 hour management of active medical problems listed below. Physiatrist and rehab team continue to assess barriers to discharge/monitor patient  progress toward functional and medical goals. FIM: FIM - Bathing Bathing Steps Patient Completed: Chest, Right Arm, Left Arm, Abdomen, Front perineal area, Buttocks, Right upper leg, Left upper leg, Right lower leg (including foot), Left lower leg (including foot) Bathing: 4: Steadying assist  FIM - Upper Body Dressing/Undressing Upper body dressing/undressing steps patient completed: Thread/unthread right sleeve of pullover shirt/dresss, Thread/unthread left sleeve of pullover shirt/dress, Put head through opening of pull over shirt/dress, Pull shirt over trunk Upper body dressing/undressing: 5: Supervision: Safety issues/verbal cues FIM - Lower Body Dressing/Undressing Lower body dressing/undressing steps patient completed: Don/Doff right sock, Don/Doff left sock, Don/Doff right shoe, Don/Doff left shoe, Fasten/unfasten right shoe, Fasten/unfasten left shoe, Thread/unthread right pants leg, Thread/unthread left pants leg, Pull pants up/down Lower body dressing/undressing: 4: Steadying Assist  FIM - Toileting Toileting steps completed by patient: Performs perineal hygiene, Adjust clothing prior to toileting Toileting Assistive Devices: Grab bar or rail for support Toileting: 3: Mod-Patient completed 2 of 3 steps  FIM - Radio producer Devices: Grab bars Toilet Transfers: 2-To toilet/BSC: Max A (lift and lower assist), 2-From toilet/BSC: Max A (lift and lower assist)  FIM - Control and instrumentation engineer Devices: Arm rests Bed/Chair Transfer: 3: Bed > Chair or W/C: Mod A (lift or lower assist), 3: Chair or W/C > Bed: Mod A (lift or lower assist)  FIM - Locomotion:  Wheelchair Distance: 40 Locomotion: Wheelchair: 1: Total Assistance/staff pushes wheelchair (Pt<25%) FIM - Locomotion: Ambulation Locomotion: Ambulation Assistive Devices: Lite Gait Ambulation/Gait Assistance: 1: +1 Total assist Locomotion: Ambulation: 1: Travels 50 - 149 ft with total assistance/helper does all (Pt.< 25%)  Comprehension Comprehension Mode: Auditory Comprehension: 5-Understands complex 90% of the time/Cues < 10% of the time  Expression Expression Mode: Verbal Expression: 5-Expresses complex 90% of the time/cues < 10% of the time  Social Interaction Social Interaction: 5-Interacts appropriately 90% of the time - Needs monitoring or encouragement for participation or interaction.  Problem Solving Problem Solving: 5-Solves complex 90% of the time/cues < 10% of the time  Memory Memory: 4-Recognizes or recalls 75 - 89% of the time/requires cueing 10 - 24% of the time  Medical Problem List and Plan: 1. Functional deficits secondary to hypertensive right lenticular nucleus/corona radiate infarct 2. DVT Prophylaxis/Anticoagulation: Pharmaceutical: Lovenox 3. Pain Management: neurontin for peripheral neuropathy.  4. Bipolar disorder/Mood: Appears stable. Continue Lamictal bid. Klonopin as needed prn anxiety.  5. Neuropsych: This patient is capable of making decisions on her own behalf. 6. Skin/Wound Care: Continue santyl to ulcer on right shin. Routine pressure relief measures.  7. Fluids/Electrolytes/Nutrition: Monitor I/O.  Will add IVF given elevated BUN and Creat 8. DM type 2 with peripheral neuropathy : Monitor BS with ac/hs checks. Continue Trajenta, amaryl bid and lantus with SSI for elevated BS.   -sugars improving---continue with current regimen 9. CKD stage III: Monitor for stability. Labs stable.  10. Dyslipidemia: On Zocor. 11. HTN: Monitor BP every 8 hours. Continue norvasc and lisinopril.  12.  S/P  Right partial foot amp 13.  Diarrhea, C diff -,  suspect viral gastroenteritis, may need GI to eval if not improving LOS (Days) 6 A FACE TO FACE EVALUATION WAS PERFORMED  Soumya Colson E 02/26/2015, 8:28 AM

## 2015-02-27 ENCOUNTER — Inpatient Hospital Stay (HOSPITAL_COMMUNITY): Payer: BC Managed Care – PPO | Admitting: Occupational Therapy

## 2015-02-27 ENCOUNTER — Inpatient Hospital Stay (HOSPITAL_COMMUNITY): Payer: Self-pay

## 2015-02-27 ENCOUNTER — Inpatient Hospital Stay (HOSPITAL_COMMUNITY): Payer: Self-pay | Admitting: Speech Pathology

## 2015-02-27 LAB — GLUCOSE, CAPILLARY
Glucose-Capillary: 115 mg/dL — ABNORMAL HIGH (ref 70–99)
Glucose-Capillary: 152 mg/dL — ABNORMAL HIGH (ref 70–99)
Glucose-Capillary: 89 mg/dL (ref 70–99)
Glucose-Capillary: 161 mg/dL — ABNORMAL HIGH (ref 70–99)

## 2015-02-27 LAB — CREATININE, SERUM
CREATININE: 1.88 mg/dL — AB (ref 0.50–1.10)
GFR calc Af Amer: 32 mL/min — ABNORMAL LOW (ref >90)
GFR, EST NON AFRICAN AMERICAN: 28 mL/min — AB (ref >90)

## 2015-02-27 NOTE — Patient Care Conference (Signed)
Inpatient RehabilitationTeam Conference and Plan of Care Update Date: 03/01/2015   Time: 8:41 AM    Patient Name: Tara Hopkins      Medical Record Number: [id number]  Date of Birth: [birth year] Sex: Female         Room/Bed: 4W22C/4W22C-01 Payor Info: Payor: [place] / Plan: [place] PPO / Product Type: *No Product type* /    Admitting Diagnosis: R CVA  Admit Date/Time:  02/20/2015  5:48 PM Admission Comments: No comment available   Primary Diagnosis:  CVA (cerebral infarction) Principal Problem: CVA (cerebral infarction)  Patient Active Problem List   Diagnosis Date Noted  . CVA (cerebral infarction) 02/20/2015  . Left hemiparesis 02/20/2015  . Cocaine abuse   . Stroke 02/15/2015  . 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 Osteomyelitis of foot, right, acute 02/23/2014  . 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 Diabetic foot infection with ulcer and abcess, right 05/21/2013  . DM type 2 with diabetic peripheral neuropathy 05/21/2013  . Benign hypertension 05/21/2013  . Depression 05/21/2013  . Hypokalemia 05/21/2013  . Hyponatremia 05/21/2013  . Anemia 05/21/2013  . Renal insufficiency 05/21/2013    Expected Discharge Date: Expected Discharge Date: 03/08/15  Team Members Present: Physician leading conference: Dr. Alysia Penna Social Worker Present: Ovidio Kin, LCSW Nurse Present: Elliot Cousin, RN PT Present: Georjean Mode, Dustin Folks, PT OT Present: [name], OT;James Noah Delaine Melrose, OT SLP Present: Windell Moulding, SLP PPS Coordinator present : Daiva Nakayama, RN, CRRN     Current Status/Progress Goal Weekly Team Focus  Medical   Frustrated, had GI viral illness, h cocaine abuse  Sup 24/7 home d/c maintain med stability  rehydrate with IV antibiotics   Bowel/Bladder   Continent of bowel and bladder LBM 4/20  maintain cont  Timed toileting    Swallow/Nutrition/  Hydration     na        ADL's   mod assist squat pivot transfers, min assist sit > stand with UE support, min-supervision with dressing tasks  supervision overall  transfers, LUE NMR, Pt and family education   Mobility     min assist-has been sick last few days-missed therapies   supervision overall   ambulation, safety, impulsivity-education  Communication     na        Safety/Cognition/ Behavioral Observations  min assist-supervision for semi-complex tasks   supervision-mod I   semi-complex problem solving and use of compensatory memory aids.     Pain   Tylenol q 4 hrs PRN H/A  Assess effectiveness of Tylenol  Assess pain q 3-4 for effectiveness of Tylenol   Skin   (R) LE wound- patient administers self-care- cleanse with saline, apply santyl, then place allevyn  Patient continue to dressing wound with nursing staff assistance  Nursing staff assess wound q shift      *See Care Plan and progress notes for long and short-term goals.  Barriers to Discharge: still needs physcial assist    Possible Resolutions to Barriers:  see above    Discharge Planning/Teaching Needs:  Home with god-daughter then once son is out of prison then he will provide supervision level      Team Discussion:  Doing well until got sick and refusing therapies and wanting to be in room due to bowel issues  Revisions to Treatment Plan:  QD therapies due to sickness   Continued Need for Acute Rehabilitation Level of Care: The patient requires daily  medical management by a physician with specialized training in physical medicine and rehabilitation for the following conditions: Daily direction of a multidisciplinary physical rehabilitation program to ensure safe treatment while eliciting the highest outcome that is of practical value to the patient.: Yes Daily medical management of patient stability for increased activity during participation in an intensive rehabilitation regime.: Yes Daily analysis of  laboratory values and/or radiology reports with any subsequent need for medication adjustment of medical intervention for : Neurological problems;Other  Elease Hashimoto 03/01/2015, 8:41 AM  Inpatient RehabilitationTeam Conference and Plan of Care Update Date: 03/06/2015   Time: 10;55 AM    Patient Name: Tara Hopkins      Medical Record Number: [id number]  Date of Birth: 1951/08/23 Sex: Female         Room/Bed: 4W22C/4W22C-01 Payor Info: Payor: [place] / Plan: [facility] PPO / Product Type: *No Product type* /    Admitting Diagnosis: R CVA  Admit Date/Time:  02/20/2015  5:48 PM Admission Comments: No comment available   Primary Diagnosis:  Acute renal failure Principal Problem: Acute renal failure  Patient Active Problem List   Diagnosis Date Noted  . Bipolar affective disorder   . Diarrhea 03/01/2015  . Nausea with vomiting 03/01/2015  . Fever presenting with conditions classified elsewhere 03/01/2015  . Acute renal failure 03/01/2015  . CVA (cerebral infarction) 02/20/2015  . Left hemiparesis 02/20/2015  . Cocaine abuse   . Stroke 02/15/2015  . 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 Osteomyelitis of foot, right, acute 02/23/2014  . 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 Diabetic foot infection with ulcer and abcess, right 05/21/2013  . DM type 2 with diabetic peripheral neuropathy 05/21/2013  . Benign hypertension 05/21/2013  . Depression 05/21/2013  . Hyponatremia 05/21/2013  . Anemia 05/21/2013  . Renal insufficiency 05/21/2013    Expected Discharge Date: Expected Discharge Date: 03/08/15  Team Members Present: Physician leading conference: Dr. Alysia Penna Social Worker Present: Ovidio Kin, LCSW Nurse Present: Elliot Cousin, RN PT Present: Raylene Everts, PT;Caroline [place], PT OT Present: Clyda Greener, OT;Julia Saguier, Jules Schick, OT SLP Present: Windell Moulding,  SLP PPS Coordinator present : Daiva Nakayama, RN, CRRN     Current Status/Progress Goal Weekly Team Focus  Medical   GI infx resolved  24/7 sup, lifestyle issues management  D/C planning   Bowel/Bladder     cont B & B        Swallow/Nutrition/ Hydration     na        ADL's   supervision bathing and dressing with UE support, min assist transfers  supervision overall  transfers, LUE NMR, pt and family education   Mobility     supervision/min assist-guiding. Safety risk   supervision/min level   ambulation, safety, education-doing better feeling better  Communication     na        Safety/Cognition/ Behavioral Observations    getting up and moving around the room-unassisted-may need chair alarm        Pain     monitor pain-no current issues        Skin     no issues           *See Care Plan and progress notes for long and short-term goals.  Barriers to Discharge: Left HP with Right med foot amp    Possible Resolutions to Barriers:  family ed    Discharge Planning/Teaching Needs:  Family coming in for training tomorrow, daughter and god-daughter, aware pt will require 24 hr care.      Team Discussion:  Downgraded goals to supervision/min assist level. Family education tomorrow. Impulsivity not new. D/C Speech reached goals. High fall risk furniture walks and is walking around the room, not calling for assistance.  Revisions to Treatment Plan:  Downgraded goals to min level   Continued Need for Acute Rehabilitation Level of Care: The patient requires daily medical management by a physician with specialized training in physical medicine and rehabilitation for the following conditions: Daily direction of a multidisciplinary physical rehabilitation program to ensure safe treatment while eliciting the highest outcome that is of practical value to the patient.: Yes Daily medical management of patient stability for increased activity during participation in an intensive  rehabilitation regime.: Yes Daily analysis of laboratory values and/or radiology reports with any subsequent need for medication adjustment of medical intervention for : Neurological problems;Other  Elease Hashimoto 03/06/2015, 2:41 PM

## 2015-02-27 NOTE — Progress Notes (Signed)
Speech Language Pathology Weekly Progress and Session Note  Patient Details  Name: Tara Hopkins MRN: [id number] Date of Birth: 1951/03/25  Beginning of progress report period: February 20, 2015 End of progress report period: February 27, 2015  Today's Date: 02/27/2015 SLP Individual Time: 0905-1000 SLP Individual Time Calculation (min): 55 min  Short Term Goals: Week 1: SLP Short Term Goal 1 (Week 1): Pt will recall at least 2 details of new information using external aids with functional daily living tasks with supervision. SLP Short Term Goal 1 - Progress (Week 1): Met SLP Short Term Goal 2 (Week 1): Pt will demonstrate appropriate safety awareness to complex situations and/or tasks with functional tasks with supervision. SLP Short Term Goal 2 - Progress (Week 1): Progressing toward goal SLP Short Term Goal 3 (Week 1): Pt will demonstrate appropriate executive function skills to participate in money/medication management functional tasks with supervision. SLP Short Term Goal 3 - Progress (Week 1): Met    New Short Term Goals: Week 2: SLP Short Term Goal 1 (Week 2): Pt will recall at least 2 details of new information using external aids with functional daily living tasks with mod I. SLP Short Term Goal 2 (Week 2): Pt will demonstrate appropriate safety awareness to complex situations and/or tasks with functional tasks with supervision. SLP Short Term Goal 3 (Week 2): Pt will demonstrate appropriate executive function skills to participate in semi-complex functional home management tasks with supervision.  Weekly Progress Updates:  Pt made functional gains this reporting period and has met 2 out of 3 short term goals.  Pt is completing basic to semi-complex functional tasks with min assist-supervision cues for intellectual/emergent awareness of deficits and recall of new information.  Pt continues to present with impulsivity and decreased safety awareness during transfers.  Pt would continue  to benefit from skilled ST while inpatient in order to maximize functional independence and reduce burden of care prior to discharge.  Suspect that pt is near her cognitive baseline and likely will not need ST follow up following discharge.  Pt and family education is ongoing.     Intensity: Minumum of 1-2 x/day, 30 to 90 minutes Frequency: 3 to 5 out of 7 days Duration/Length of Stay: 14 days Treatment/Interventions: Cognitive remediation/compensation;Cueing hierarchy;Internal/external aids;Functional tasks;Speech/Language facilitation;Patient/family education;Multimodal communication approach;Therapeutic Activities   Daily Session  Skilled Therapeutic Interventions: Pt was seen for skilled ST targeting cognitive goals. Upon arrival, pt was reclined in bed, awake, alert, and agreeable to participate in [place].  Pt reported that she was upset that she had therapies prior to taking a shower but was easily redirected and remained actively engaged in all therapeutic activities.  Pt initiated request to use the restroom and SLP provided min verbal cues for safety awareness during transfer.  Upon completion of toileting, SLP facilitated the session with a semi-complex home management/grocery planning task targeting planning and organization.  Pt generated a list of food items that she typically buys for a month's worth of groceries with supervision.  Pt then located items from a grocery store flyer with min instructional faded to supervision cues for thought organization and intermittent redirection to task.  Pt demonstrated appropriate abstract verbal reasoning for comparing prices of items and rationalizing one sale price over another with supervision question cues.  Pt was left with a calculator at the end of today's session in order to complete functional math calculations to determine the cost of her list of items in between therapy sessions.  SLP will follow up  to complete task at next available appointment.   Goals updated on this date to reflect current progress and plan of care.        FIM:  Comprehension Comprehension Mode: Auditory Comprehension: 5-Understands complex 90% of the time/Cues < 10% of the time Expression Expression Mode: Verbal Expression: 5-Expresses complex 90% of the time/cues < 10% of the time Social Interaction Social Interaction: 5-Interacts appropriately 90% of the time - Needs monitoring or encouragement for participation or interaction. Problem Solving Problem Solving: 5-Solves complex 90% of the time/cues < 10% of the time Memory Memory: 4-Recognizes or recalls 75 - 89% of the time/requires cueing 10 - 24% of the time General    Pain Pain Assessment Pain Assessment: No/denies pain  Therapy/Group: Individual Therapy  Morgon Pamer, Selinda Orion 02/27/2015, 11:50 AM

## 2015-02-27 NOTE — Progress Notes (Signed)
Occupational Therapy Session Note  Patient Details  Name: Tara Hopkins MRN: [id number] Date of Birth: February 16, 1951  Today's Date: 02/27/2015 OT Individual Time: 1115-1200 OT Individual Time Calculation (min): 45 min    Short Term Goals: Week 1:  OT Short Term Goal 1 (Week 1): Pt will complete tub/shower transfer with min assist with LRAD OT Short Term Goal 2 (Week 1): Pt will complete toilet transfer with min assist with LRAD OT Short Term Goal 3 (Week 1): Pt will complete UB dressing with supervision OT Short Term Goal 4 (Week 1): Pt will complete LB dressing with min assist OT Short Term Goal 5 (Week 1): Pt will complete grooming tasks in standing with use of LUE as gross assist  Skilled Therapeutic Interventions/Progress Updates:    Skilled OT session focusing on ADL retraining and basic transfer technique. Pt reported feeling better after experiencing stomach virus for the past 24 hrs. Describes experience as "stomach bug took toxins out and I don't feel heavy." Pt demonstrated inc initiation in self-care tasks. Therapist facilitated bathing in walk in shower with tub bench ad dressing at overall supervision level. Pt demonstrates supervision level sit>stand for bathing/dressing in the presence of grab bars or other stabilizing item. Pt continues to require min A without UE support. Worked on basic w/c<>recliner transfer using squat pivot instead of stand step pivot for safety and energy conservation. Pt provided teach back on procedures such as checking wheels are locked and arm rests are out of way.     Therapy Documentation Precautions:  Precautions Precautions: Fall Precaution Comments: Rt mid foot amputation Restrictions Weight Bearing Restrictions: No General:   Vital Signs:  Pain: no c/o pain Pain Assessment Pain Assessment: No/denies pain  See FIM for current functional status  Therapy/Group: Individual Therapy  Kayce Betty 02/27/2015, 12:42 PM

## 2015-02-27 NOTE — Progress Notes (Signed)
64 y.o. RH-female with history of HTN, DM, peripheral neuropathy, anxiety disorder; who was admitted via EMS on 02/15/15 with acute onset of left sided weakness and slurred speech. Upon EMS evaluation BP 220/130 and CBG of 224. She was given labetalol 54m IV x 1 with improvement of BP to 167/71. UDS positive for cocaine. CT head negative and tPA administered. MRI/MRA brain was done revealing acute nonhemorrhagic infarcts in posterior aspect of right lenticular nucleus to posterior corona radiata and posterior limb of L-IC.    Subjective/Complaints: Nausea, vomiting and diarrhea resolved ROS - weakness Left side but moving LLE better Objective: Vital Signs: Blood pressure 154/90, pulse 83, temperature 97.8 F (36.6 C), temperature source Oral, resp. rate 18, SpO2 97 %. Dg Abd 1 View  02/26/2015   CLINICAL DATA:  Diarrhea  EXAM: ABDOMEN - 1 VIEW  COMPARISON:  None.  FINDINGS: Nonobstructive bowel gas pattern. No free air on this supine image. No organomegaly or suspicious calcification. Calcified phleboliths in the pelvis.  Degenerative changes in the lower lumbar spine and hips. No acute bony abnormality.  IMPRESSION: No acute findings.   Electronically Signed   By: KRolm BaptiseM.D.   On: 02/26/2015 08:12   Results for orders placed or performed during the hospital encounter of 02/20/15 (from the past 72 hour(s))  Glucose, capillary     Status: Abnormal   Collection Time: 02/24/15 11:52 AM  Result Value Ref Range   Glucose-Capillary 199 (H) 70 - 99 mg/dL  Glucose, capillary     Status: Abnormal   Collection Time: 02/24/15  4:58 PM  Result Value Ref Range   Glucose-Capillary 220 (H) 70 - 99 mg/dL  Glucose, capillary     Status: None   Collection Time: 02/24/15  9:09 PM  Result Value Ref Range   Glucose-Capillary 84 70 - 99 mg/dL  Glucose, capillary     Status: Abnormal   Collection Time: 02/25/15  6:48 AM  Result Value Ref Range   Glucose-Capillary 177 (H) 70 - 99 mg/dL  Glucose,  capillary     Status: Abnormal   Collection Time: 02/25/15 11:32 AM  Result Value Ref Range   Glucose-Capillary 144 (H) 70 - 99 mg/dL   Comment 1 Notify RN   Glucose, capillary     Status: Abnormal   Collection Time: 02/25/15  4:20 PM  Result Value Ref Range   Glucose-Capillary 228 (H) 70 - 99 mg/dL   Comment 1 Notify RN   Glucose, capillary     Status: Abnormal   Collection Time: 02/25/15  8:58 PM  Result Value Ref Range   Glucose-Capillary 137 (H) 70 - 99 mg/dL  Clostridium Difficile by PCR     Status: None   Collection Time: 02/25/15  9:28 PM  Result Value Ref Range   C difficile by pcr NEGATIVE NEGATIVE  Basic metabolic panel     Status: Abnormal   Collection Time: 02/26/15  6:34 AM  Result Value Ref Range   Sodium 139 135 - 145 mmol/L   Potassium 4.6 3.5 - 5.1 mmol/L   Chloride 105 96 - 112 mmol/L   CO2 22 19 - 32 mmol/L   Glucose, Bld 164 (H) 70 - 99 mg/dL   BUN 48 (H) 6 - 23 mg/dL   Creatinine, Ser 2.00 (H) 0.50 - 1.10 mg/dL   Calcium 9.8 8.4 - 10.5 mg/dL   GFR calc non Af Amer 26 (L) >90 mL/min   GFR calc Af Amer 30 (L) >90 mL/min  Comment: (NOTE) The eGFR has been calculated using the CKD EPI equation. This calculation has not been validated in all clinical situations. eGFR's persistently <90 mL/min signify possible Chronic Kidney Disease.    Anion gap 12 5 - 15  CBC with Differential/Platelet     Status: Abnormal   Collection Time: 02/26/15  6:34 AM  Result Value Ref Range   WBC 9.2 4.0 - 10.5 K/uL   RBC 3.95 3.87 - 5.11 MIL/uL   Hemoglobin 11.4 (L) 12.0 - 15.0 g/dL   HCT 35.4 (L) 36.0 - 46.0 %   MCV 89.6 78.0 - 100.0 fL   MCH 28.9 26.0 - 34.0 pg   MCHC 32.2 30.0 - 36.0 g/dL   RDW 13.9 11.5 - 15.5 %   Platelets 365 150 - 400 K/uL   Neutrophils Relative % 73 43 - 77 %   Neutro Abs 6.6 1.7 - 7.7 K/uL   Lymphocytes Relative 19 12 - 46 %   Lymphs Abs 1.8 0.7 - 4.0 K/uL   Monocytes Relative 7 3 - 12 %   Monocytes Absolute 0.7 0.1 - 1.0 K/uL   Eosinophils  Relative 1 0 - 5 %   Eosinophils Absolute 0.1 0.0 - 0.7 K/uL   Basophils Relative 0 0 - 1 %   Basophils Absolute 0.0 0.0 - 0.1 K/uL  Glucose, capillary     Status: Abnormal   Collection Time: 02/26/15  7:50 AM  Result Value Ref Range   Glucose-Capillary 139 (H) 70 - 99 mg/dL  Glucose, capillary     Status: Abnormal   Collection Time: 02/26/15 11:24 AM  Result Value Ref Range   Glucose-Capillary 168 (H) 70 - 99 mg/dL  Glucose, capillary     Status: Abnormal   Collection Time: 02/26/15  4:25 PM  Result Value Ref Range   Glucose-Capillary 51 (L) 70 - 99 mg/dL  Glucose, capillary     Status: Abnormal   Collection Time: 02/26/15  6:39 PM  Result Value Ref Range   Glucose-Capillary 63 (L) 70 - 99 mg/dL  Glucose, capillary     Status: None   Collection Time: 02/26/15  7:20 PM  Result Value Ref Range   Glucose-Capillary 70 70 - 99 mg/dL  Glucose, capillary     Status: Abnormal   Collection Time: 02/26/15  9:07 PM  Result Value Ref Range   Glucose-Capillary 68 (L) 70 - 99 mg/dL  Glucose, capillary     Status: None   Collection Time: 02/26/15  9:52 PM  Result Value Ref Range   Glucose-Capillary 80 70 - 99 mg/dL  Glucose, capillary     Status: Abnormal   Collection Time: 02/27/15  6:36 AM  Result Value Ref Range   Glucose-Capillary 115 (H) 70 - 99 mg/dL  Creatinine, serum     Status: Abnormal   Collection Time: 02/27/15  6:37 AM  Result Value Ref Range   Creatinine, Ser 1.88 (H) 0.50 - 1.10 mg/dL   GFR calc non Af Amer 28 (L) >90 mL/min   GFR calc Af Amer 32 (L) >90 mL/min    Comment: (NOTE) The eGFR has been calculated using the CKD EPI equation. This calculation has not been validated in all clinical situations. eGFR's persistently <90 mL/min signify possible Chronic Kidney Disease.      HEENT: poor dentition Cardio: RRR and no murmur Resp: CTA B/L and unlabored GI: BS positive and NT, ND Extremity:  Pulses positive and No Edema Skin:   Wound Right post leg 1.5cm  granulating area no odor Neuro: Alert/Oriented, Cranial Nerve II-XII normal, Normal Sensory, Abnormal Motor 4-/5 R delt bi tri grip,4 L HF , KE ankle DF, R 5/5 except Right ankle limited by pertial foot amp and Abnormal FMC Ataxic/ dec FMC Musc/Skel:  Other Right trans met amp Gen NAD   Assessment/Plan: 1. Functional deficits secondary to right lenticular nucleus/corona radiate thrombotic infarct  which require 3+ hours per day of interdisciplinary therapy in a comprehensive inpatient rehab setting. Physiatrist is providing close team supervision and 24 hour management of active medical problems listed below. Physiatrist and rehab team continue to assess barriers to discharge/monitor patient progress toward functional and medical goals. Team conference today please see physician documentation under team conference tab, met with team face-to-face to discuss problems,progress, and goals. Formulized individual treatment plan based on medical history, underlying problem and comorbidities. FIM: FIM - Bathing Bathing Steps Patient Completed: Chest, Right Arm, Left Arm, Abdomen, Front perineal area, Buttocks, Right upper leg, Left upper leg, Right lower leg (including foot), Left lower leg (including foot) Bathing: 4: Steadying assist  FIM - Upper Body Dressing/Undressing Upper body dressing/undressing steps patient completed: Thread/unthread right sleeve of pullover shirt/dresss, Thread/unthread left sleeve of pullover shirt/dress, Put head through opening of pull over shirt/dress, Pull shirt over trunk Upper body dressing/undressing: 5: Supervision: Safety issues/verbal cues FIM - Lower Body Dressing/Undressing Lower body dressing/undressing steps patient completed: Don/Doff right sock, Don/Doff left sock, Don/Doff right shoe, Don/Doff left shoe, Fasten/unfasten right shoe, Fasten/unfasten left shoe, Thread/unthread right pants leg, Thread/unthread left pants leg, Pull pants up/down Lower body  dressing/undressing: 4: Steadying Assist  FIM - Toileting Toileting steps completed by patient: Performs perineal hygiene, Adjust clothing prior to toileting Toileting Assistive Devices: Grab bar or rail for support Toileting: 3: Mod-Patient completed 2 of 3 steps  FIM - Radio producer Devices: Grab bars Toilet Transfers: 2-To toilet/BSC: Max A (lift and lower assist), 2-From toilet/BSC: Max A (lift and lower assist)  FIM - Control and instrumentation engineer Devices: Arm rests Bed/Chair Transfer: 3: Bed > Chair or W/C: Mod A (lift or lower assist), 3: Chair or W/C > Bed: Mod A (lift or lower assist)  FIM - Locomotion: Wheelchair Distance: 40 Locomotion: Wheelchair: 1: Total Assistance/staff pushes wheelchair (Pt<25%) FIM - Locomotion: Ambulation Locomotion: Ambulation Assistive Devices: Lite Gait Ambulation/Gait Assistance: 1: +1 Total assist Locomotion: Ambulation: 1: Travels 50 - 149 ft with total assistance/helper does all (Pt.< 25%)  Comprehension Comprehension Mode: Auditory Comprehension: 5-Understands complex 90% of the time/Cues < 10% of the time  Expression Expression Mode: Verbal Expression: 5-Expresses complex 90% of the time/cues < 10% of the time  Social Interaction Social Interaction: 5-Interacts appropriately 90% of the time - Needs monitoring or encouragement for participation or interaction.  Problem Solving Problem Solving: 5-Solves complex 90% of the time/cues < 10% of the time  Memory Memory: 4-Recognizes or recalls 75 - 89% of the time/requires cueing 10 - 24% of the time  Medical Problem List and Plan: 1. Functional deficits secondary to hypertensive right lenticular nucleus/corona radiate infarct 2. DVT Prophylaxis/Anticoagulation: Pharmaceutical: Lovenox 3. Pain Management: neurontin for peripheral neuropathy.  4. Bipolar disorder/Mood: Appears stable. Continue Lamictal bid. Klonopin as needed prn  anxiety.  5. Neuropsych: This patient is capable of making decisions on her own behalf. 6. Skin/Wound Care: Continue santyl to ulcer on right shin. Routine pressure relief measures.  7. Fluids/Electrolytes/Nutrition: Monitor I/O.  Will add IVF given elevated BUN and Creat, still some elevation of  creat, change IVF to noc, will recheck BMET 8. DM type 2 with peripheral neuropathy : Monitor BS with ac/hs checks. Continue Trajenta, amaryl bid and lantus with SSI for elevated BS.   -sugars improving---continue with current regimen 9. CKD stage III: Monitor for stability. Labs stable.  10. Dyslipidemia: On Zocor. 11. HTN: Monitor BP every 8 hours. Continue norvasc and lisinopril.  12.  S/P  Right partial foot amp 13.  Diarrhea, C diff -, suspect viral gastroenteritis,resolved LOS (Days) 7 A FACE TO FACE EVALUATION WAS PERFORMED  , E 02/27/2015, 9:13 AM

## 2015-02-27 NOTE — Patient Care Conference (Signed)
Inpatient RehabilitationTeam Conference and Plan of Care Update Date: 02/27/2015   Time: 10;50 AM    Patient Name: Tara Hopkins      Medical Record Number: [id number]  Date of Birth: 25-Aug-1951 Sex: Female         Room/Bed: 4W22C/4W22C-01 Payor Info: Payor: [place] / Plan: [employer] PPO / Product Type: *No Product type* /    Admitting Diagnosis: R CVA  Admit Date/Time:  02/20/2015  5:48 PM Admission Comments: No comment available   Primary Diagnosis:  CVA (cerebral infarction) Principal Problem: CVA (cerebral infarction)  Patient Active Problem List   Diagnosis Date Noted  . CVA (cerebral infarction) 02/20/2015  . Left hemiparesis 02/20/2015  . Cocaine abuse   . Stroke 02/15/2015  . 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 Osteomyelitis of foot, right, acute 02/23/2014  . 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 Diabetic foot infection with ulcer and abcess, right 05/21/2013  . DM type 2 with diabetic peripheral neuropathy 05/21/2013  . Benign hypertension 05/21/2013  . Depression 05/21/2013  . Hypokalemia 05/21/2013  . Hyponatremia 05/21/2013  . Anemia 05/21/2013  . Renal insufficiency 05/21/2013    Expected Discharge Date: Expected Discharge Date: 03/08/15  Team Members Present: Physician leading conference: Dr. Alysia Penna Social Worker Present: Ovidio Kin, LCSW Nurse Present: Elliot Cousin, RN PT Present: Georjean Mode, Dustin Folks, PT OT Present: [name], OT;James Noah Delaine [place], OT SLP Present: Windell Moulding, SLP PPS Coordinator present : Daiva Nakayama, RN, CRRN     Current Status/Progress Goal Weekly Team Focus  Medical   Frustrated, had GI viral illness, h cocaine abuse  Sup 24/7 home d/c maintain med stability  rehydrate with IV antibiotics   Bowel/Bladder   Continent of bowel and bladder LBM 4/20  maintain cont  Timed toileting    Swallow/Nutrition/  Hydration     wfl        ADL's   mod assist squat pivot transfers, min assist sit > stand with UE support, min-supervision with dressing tasks  supervision overall  transfers, LUE NMR, Pt and family education   Mobility     min/mod assist with transfers, along with cueing. Ambulation mod assist 25 ft    supervision overall   transfers, ambulation and family education  Communication     na        Safety/Cognition/ Behavioral Observations  min assist-supervision for semi-complex tasks   supervision-mod I   semi-complex problem solving and use of compensatory memory aids.     Pain   Tylenol q 4 hrs PRN H/A  Assess effectiveness of Tylenol  Assess pain q 3-4 for effectiveness of Tylenol   Skin   (R) LE wound- patient administers self-care- cleanse with saline, apply santyl, then place allevyn  Patient continue to dressing wound with nursing staff assistance  Nursing staff assess wound q shift      *See Care Plan and progress notes for long and short-term goals.  Barriers to Discharge: still needs physcial assist    Possible Resolutions to Barriers:  see above    Discharge Planning/Teaching Needs:  Home with god-daughter then once son is out of prison then he will provide supervision level      Team Discussion:  Goals-supervision overall, feeling much better today. Transfers and standing biggest obstacles in therapies, working on this.  Very motivated to improve  Revisions to Treatment Plan:  None   Continued Need for Acute Rehabilitation Level  of Care: The patient requires daily medical management by a physician with specialized training in physical medicine and rehabilitation for the following conditions: Daily direction of a multidisciplinary physical rehabilitation program to ensure safe treatment while eliciting the highest outcome that is of practical value to the patient.: Yes Daily medical management of patient stability for increased activity during participation in an  intensive rehabilitation regime.: Yes Daily analysis of laboratory values and/or radiology reports with any subsequent need for medication adjustment of medical intervention for : Neurological problems;Other  Elease Hashimoto 03/01/2015, 8:42 AM

## 2015-02-27 NOTE — Progress Notes (Signed)
Occupational Therapy Weekly Progress Note  Patient Details  Name: Tara Hopkins MRN: [id number] Date of Birth: 1951/08/16  Beginning of progress report period: February 21, 2015 End of progress report period: February 27, 2015     Patient has met 2 of 5 short term goals.  Pt has partly met 2/5 short term goals. Demonstrates capability of min A transfers at w/c level, but therapy sessions have not yet progressed to other modes of transfer. Pt is progressing toward standing grooming goal with use of LUE gross assist as evidenced by ability to participate in other standing bilateral therapeutic activities. Noted emotional lability during sessions attributed in part to loss of function and concern for care post d/c. Progress limited this week by 24 hour stomach virus resulting in missed therapy.   Patient continues to demonstrate the following deficits: Lt hemiplegia, decreased standing balance, trunk control, transfers, fine motor control and therefore will continue to benefit from skilled OT intervention to enhance overall performance with BADL and Reduce care partner burden.  Patient progressing toward long term goals..  Continue plan of care.  OT Short Term Goals Week 1:  OT Short Term Goal 1 (Week 1): Pt will complete tub/shower transfer with min assist with LRAD OT Short Term Goal 1 - Progress (Week 1): Partly met OT Short Term Goal 2 (Week 1): Pt will complete toilet transfer with min assist with LRAD OT Short Term Goal 2 - Progress (Week 1): Partly met OT Short Term Goal 3 (Week 1): Pt will complete UB dressing with supervision OT Short Term Goal 3 - Progress (Week 1): Met OT Short Term Goal 4 (Week 1): Pt will complete LB dressing with min assist OT Short Term Goal 4 - Progress (Week 1): Met OT Short Term Goal 5 (Week 1): Pt will complete grooming tasks in standing with use of LUE as gross assist OT Short Term Goal 5 - Progress (Week 1): Progressing toward goal Week 2:  OT Short Term  Goal 1 (Week 2): Pt will complete tub/shower transfer with supervision with RW OT Short Term Goal 2 (Week 2): Pt will complete toilet transfer with supervision with RW OT Short Term Goal 3 (Week 2): Pt will complete grooming tasks in standing with use of LUE as gross assist  Skilled Therapeutic Interventions/Progress Updates:      Therapy Documentation Precautions:  Precautions Precautions: Fall Precaution Comments: Rt mid foot amputation Restrictions Weight Bearing Restrictions: No  See FIM for current functional status    Heidi Bishop 02/27/2015, 4:08 PM

## 2015-02-27 NOTE — Progress Notes (Signed)
Social Work Patient ID: Tara Hopkins, female   DOB: [birth year], 64 y.o.   MRN: [id number] Met with pt and spoke with [name] daughter to inform of team conference goals-supervision level and discharge 4/29. Discussed family education needed prior to discharge and she was agreeable to this. Pt is pleased and feels much better today. Will work on discharge plans.

## 2015-02-27 NOTE — Progress Notes (Signed)
Physical Therapy Note  Patient Details  Name: Tara Hopkins MRN: [id number] Date of Birth: September 21, 1951 Today's Date: 02/27/2015    Pt missed 60 mins of skilled PT session due to being on medical hold from nausea/vomiting, will continue to check with pt as hold is DC'd.     Denice Bors 02/27/2015, 8:07 AM

## 2015-02-27 NOTE — Progress Notes (Signed)
Physical Therapy Session Note  Patient Details  Name: Tara Hopkins MRN: [id number] Date of Birth: [birth year]  Today's Date: 02/27/2015 PT Individual Time: 1000-1100 PT Individual Time Calculation (min): 60 min   Short Term Goals: Week 1:  PT Short Term Goal 1 (Week 1): Pt will perform bed mobility mod I.  PT Short Term Goal 2 (Week 1): Pt will perform bed<>w/c transfer with min A and 25% cues from PT.  PT Short Term Goal 3 (Week 1): Pt will propel w/c 100' with mod A and 50% cues from PT.  PT Short Term Goal 4 (Week 1): Pt will ambulate 32' with mod A and LRAD with 50% cues from PT  Skilled Therapeutic Interventions/Progress Updates:    Pt received seated in recliner, agreeable to participate in therapy. Session focused on transfers, standing balance, pre-gait activities. Instructed pt in multiple squat pivot transfers from bed<>mat table and recliner w/ overall MinA with mod cueing for sequencing and hand placement. Instructed pt in pre-gait tasks at edge of mat including stepping forward/backward and stepping up onto 3" step with alternating LE to increase time in single leg stance. Instructed pt in w/c propulsion w/ BUE w/ max cueing for use of LUE to maintain path and Mod physical assist for steering. Instructed pt in ambulation with [facility] 25' w/ Mod physical assist from therapist and +2 for w/c follow and IV pole management. Pt tolerated session well. Session ended in pt's room, where pt was left seated in recliner w/ all needs within reach.    Therapy Documentation Precautions:  Precautions Precautions: Fall Precaution Comments: Rt mid foot amputation Restrictions Weight Bearing Restrictions: No Pain: Pain Assessment Pain Assessment: No/denies pain  See FIM for current functional status  Therapy/Group: Individual Therapy  Rada Hay  Rada Hay, PT, DPT 02/27/2015, 7:48 AM

## 2015-02-27 NOTE — Progress Notes (Signed)
Occupational Therapy Session Note  Patient Details  Name: Tara Hopkins MRN: [id number] Date of Birth: 1951-07-07  Today's Date: 02/27/2015 OT Individual Time: 1400-1430 OT Individual Time Calculation (min): 30 min    Short Term Goals: Week 1:  OT Short Term Goal 1 (Week 1): Pt will complete tub/shower transfer with min assist with LRAD OT Short Term Goal 2 (Week 1): Pt will complete toilet transfer with min assist with LRAD OT Short Term Goal 3 (Week 1): Pt will complete UB dressing with supervision OT Short Term Goal 4 (Week 1): Pt will complete LB dressing with min assist OT Short Term Goal 5 (Week 1): Pt will complete grooming tasks in standing with use of LUE as gross assist  Skilled Therapeutic Interventions/Progress Updates:    Engaged in therapeutic activity with focus on transfers, sit <> stand, and LUE functional use.  Upon arrival, pt reports need to toilet.  Squat pivot to w/c with min assist with cues for technique.  Pt utilized grab bars in bathroom and completed toilet transfer with min assist with UE support.  In therapy gym engaged in sit <> stand with focus on improved technique.  Completed table top task in standing incorporating reaching to Lt to obtain items and then cross midline to place on target.  Pt tearful during session with reports of being worried that she might not be well before d/c date and that family says they will help but they may not stick around.  Moral support and distraction provided.  Therapy Documentation Precautions:  Precautions Precautions: Fall Precaution Comments: Rt mid foot amputation Restrictions Weight Bearing Restrictions: No General:   Vital Signs: Therapy Vitals Temp: 98 F (36.7 C) Temp Source: Oral Pulse Rate: 64 Resp: 18 BP: 125/66 mmHg Oxygen Therapy SpO2: 98 % O2 Device: Not Delivered Pain: Pain Assessment Pain Assessment: No/denies pain  See FIM for current functional status  Therapy/Group: Individual  Therapy  [name] 02/27/2015, 3:32 PM

## 2015-02-27 NOTE — Progress Notes (Signed)
Inpatient Diabetes Program Recommendations  AACE/ADA: New Consensus Statement on Inpatient Glycemic Control (2013)  Target Ranges:  Prepandial:   less than 140 mg/dL      Peak postprandial:   less than 180 mg/dL (1-2 hours)      Critically ill patients:  140 - 180 mg/dL   Results for Tara Hopkins, Tara Hopkins (MRN [id number]) as of 02/27/2015 10:30  Ref. Range 02/26/2015 07:50 02/26/2015 11:24 02/26/2015 16:25 02/26/2015 18:39 02/26/2015 19:20 02/26/2015 21:07 02/26/2015 21:52 02/27/2015 06:36 02/27/2015 06:37  Glucose-Capillary Latest Ref Range: 70-99 mg/dL 139 (H) 168 (H) 51 (L) 63 (L) 70 68 (L) 80 115 (H)     Inpatient Diabetes Program Recommendations Correction (SSI): Patient had hypoglycemia after recieving Novolog correction 4 units. Since patient is on Rehab floor, please consider d/c'ing the correction scale and keeping all other medication.   Thanks,  Tama Headings RN, MSN, [facility] Inpatient Diabetes Coordinator Team Pager [phone]

## 2015-02-28 ENCOUNTER — Inpatient Hospital Stay (HOSPITAL_COMMUNITY): Payer: Self-pay | Admitting: Speech Pathology

## 2015-02-28 ENCOUNTER — Inpatient Hospital Stay (HOSPITAL_COMMUNITY): Payer: Self-pay | Admitting: Occupational Therapy

## 2015-02-28 ENCOUNTER — Inpatient Hospital Stay (HOSPITAL_COMMUNITY): Payer: Self-pay

## 2015-02-28 LAB — GLUCOSE, CAPILLARY
GLUCOSE-CAPILLARY: 81 mg/dL (ref 70–99)
GLUCOSE-CAPILLARY: 63 mg/dL — AB (ref 70–99)
GLUCOSE-CAPILLARY: 95 mg/dL (ref 70–99)
Glucose-Capillary: 136 mg/dL — ABNORMAL HIGH (ref 70–99)
Glucose-Capillary: 125 mg/dL — ABNORMAL HIGH (ref 70–99)

## 2015-02-28 LAB — BASIC METABOLIC PANEL
ANION GAP: 9 (ref 5–15)
BUN: 36 mg/dL — ABNORMAL HIGH (ref 6–23)
CO2: 21 mmol/L (ref 19–32)
CREATININE: 1.7 mg/dL — AB (ref 0.50–1.10)
Calcium: 9.4 mg/dL (ref 8.4–10.5)
Chloride: 107 mmol/L (ref 96–112)
GFR, EST AFRICAN AMERICAN: 36 mL/min — AB (ref >90)
GFR, EST NON AFRICAN AMERICAN: 31 mL/min — AB (ref >90)
Glucose, Bld: 125 mg/dL — ABNORMAL HIGH (ref 70–99)
Potassium: 4.1 mmol/L (ref 3.5–5.1)
SODIUM: 137 mmol/L (ref 135–145)

## 2015-02-28 LAB — NOROVIRUS GROUP 1 & 2 BY PCR, STOOL
Norovirus 1 by PCR: NEGATIVE
Norovirus 2  by PCR: NEGATIVE

## 2015-02-28 LAB — CLOSTRIDIUM DIFFICILE BY PCR: Toxigenic C. Difficile by PCR: NEGATIVE

## 2015-02-28 MED ORDER — GLUCOSE-VITAMIN C 4-6 GM-MG PO CHEW
CHEWABLE_TABLET | ORAL | Status: AC
  Filled 2015-02-28: qty 3

## 2015-02-28 MED ORDER — CALCIUM POLYCARBOPHIL 625 MG PO TABS
1250.0000 mg | ORAL_TABLET | Freq: Every day | ORAL | Status: DC
  Administered 2015-02-28 – 2015-03-08 (×9): 1250 mg via ORAL
  Filled 2015-02-28 (×12): qty 2

## 2015-02-28 NOTE — Progress Notes (Signed)
Occupational Therapy Session Note  Patient Details  Name: Tara Hopkins MRN: [id number] Date of Birth: 24-Aug-1951  Today's Date: 02/28/2015 OT Individual Time: 0900-1000 OT Individual Time Calculation (min): 60 min    Short Term Goals: Week 1:  OT Short Term Goal 1 (Week 1): Pt will complete tub/shower transfer with min assist with LRAD OT Short Term Goal 1 - Progress (Week 1): Partly met OT Short Term Goal 2 (Week 1): Pt will complete toilet transfer with min assist with LRAD OT Short Term Goal 2 - Progress (Week 1): Partly met OT Short Term Goal 3 (Week 1): Pt will complete UB dressing with supervision OT Short Term Goal 3 - Progress (Week 1): Met OT Short Term Goal 4 (Week 1): Pt will complete LB dressing with min assist OT Short Term Goal 4 - Progress (Week 1): Met OT Short Term Goal 5 (Week 1): Pt will complete grooming tasks in standing with use of LUE as gross assist OT Short Term Goal 5 - Progress (Week 1): Progressing toward goal Week 2:  OT Short Term Goal 1 (Week 2): Pt will complete tub/shower transfer with supervision with RW OT Short Term Goal 2 (Week 2): Pt will complete toilet transfer with supervision with RW OT Short Term Goal 3 (Week 2): Pt will complete grooming tasks in standing with use of LUE as gross assist Week 3:     Skilled Therapeutic Interventions/Progress Updates:    1:1 pt with ongoing loose stool and needed to transfer to toilet 4x in session to void of stool.  Pt reports increased weakness today due to increased amt of voiding (liquid). Pt requested to continue to try to work with therapy. Focused on stand pivot transfer w/c<>shower bench and w/c<>toilet, sit to stand for clothing management, toilet hygiene in sitting position, activity tolerance, standing balance with and without UE support and [name] with small manipulatives from home program. Pt reports her [facility] is worse today due to fatigue.   Therapy Documentation Precautions:   Precautions Precautions: Fall Precaution Comments: Rt mid foot amputation Restrictions Weight Bearing Restrictions: No Pain:  no c/o pain in session   See FIM for current functional status  Therapy/Group: Individual Therapy  Willeen Cass [name] 02/28/2015, 3:26 PM

## 2015-02-28 NOTE — Progress Notes (Signed)
Occupational Therapy Session Note  Patient Details  Name: Nolah Krenzer MRN: [id number] Date of Birth: 03/27/1951  Today's Date: 02/28/2015 OT Individual Time: 2010-0712 OT Individual Time Calculation (min): 25 min  and Today's Date: 02/28/2015 OT Missed Time: 35 Minutes Missed Time Reason: Patient fatigue;Patient ill (comment)   Short Term Goals: Week 2:  OT Short Term Goal 1 (Week 2): Pt will complete tub/shower transfer with supervision with RW OT Short Term Goal 2 (Week 2): Pt will complete toilet transfer with supervision with RW OT Short Term Goal 3 (Week 2): Pt will complete grooming tasks in standing with use of LUE as gross assist  Skilled Therapeutic Interventions/Progress Updates:  Upon entering the room, pt supine in bed with no c/o pain. Pt reporting stomach is upset and she has been having diarrhea all day. Pt declining toileting this session. Pt required coaxing for participation but pt unwilling to move from supine position. Pt began crying and stating, " It just seems to be one thing after another and I can't walk. " Therapist discussed pt progress and OT goals together. Pt stating she was feeling overwhelmed with medical concerns at this time. OT also educating pt on factors that contribute to CVA in terms of things we can control and things we can't. Also discussed calming strategies since feeling overwelmed and pt using the term "depressed" over current situation. Pt overall reporting feeling better upon therapist exiting the room. Bed alarm activated and call bell within reach.    Therapy Documentation Precautions:  Precautions Precautions: Fall Precaution Comments: Rt mid foot amputation Restrictions Weight Bearing Restrictions: No General: General OT Amount of Missed Time: 35 Minutes Vital Signs: Therapy Vitals Temp: 98.2 F (36.8 C) Temp Source: Oral Pulse Rate: 71 Resp: 18 BP: (!) 114/57 mmHg Patient Position (if appropriate): Sitting Oxygen  Therapy SpO2: 99 % O2 Device: Not Delivered  See FIM for current functional status  Therapy/Group: Individual Therapy  Phineas Semen 02/28/2015, 4:13 PM

## 2015-02-28 NOTE — Progress Notes (Signed)
SLP Cancellation Note  Patient Details Name: Tara Hopkins MRN: [id number] DOB: Jun 07, 1951   Cancelled treatment:        SLP attempted to see pt for skilled ST.  However, pt with recurrent diarrhea and refused to participate in [place].  As a result, pt missed 30 minutes of ST.                                                                                                  Lysandra Loughmiller, Selinda Orion 02/28/2015, 9:34 PM

## 2015-02-28 NOTE — Progress Notes (Signed)
Physical Therapy Note  Patient Details  Name: Tara Hopkins MRN: [id number] Date of Birth: 25-May-1951 Today's Date: 02/28/2015    Patient missed 60 minutes of PT secondary to refusal due to diarrhea. "I have been up all night" "Doctor said I could" PT attempted to engage patient in bed level activities, but she refused due to not feeling well.   Tara Hopkins M 02/28/2015, 11:24 AM

## 2015-02-28 NOTE — Plan of Care (Signed)
Problem: RH SKIN INTEGRITY Goal: RH STG ABLE TO PERFORM INCISION/WOUND CARE W/ASSISTANCE STG Able To Perform Incision/Wound Care minimal assistance.  Outcome: Progressing Self-care of (R)LE wound-cleanse, santyl ointment, and allevyn dressing placed

## 2015-02-28 NOTE — Progress Notes (Signed)
64 y.o. RH-female with history of HTN, DM, peripheral neuropathy, anxiety disorder; who was admitted via EMS on 02/15/15 with acute onset of left sided weakness and slurred speech. Upon EMS evaluation BP 220/130 and CBG of 224. She was given labetalol 68m IV x 1 with improvement of BP to 167/71. UDS positive for cocaine. CT head negative and tPA administered. MRI/MRA brain was done revealing acute nonhemorrhagic infarcts in posterior aspect of right lenticular nucleus to posterior corona radiata and posterior limb of L-IC.    Subjective/Complaints: DIarrhea restarted last noc, no rectal tube, + nausea no vomiting or abd pain. ROS - weakness Left side but moving LLE better Objective: Vital Signs: Blood pressure 134/64, pulse 76, temperature 98.3 F (36.8 C), temperature source Oral, resp. rate 18, SpO2 97 %. No results found. Results for orders placed or performed during the hospital encounter of 02/20/15 (from the past 72 hour(s))  Glucose, capillary     Status: Abnormal   Collection Time: 02/25/15 11:32 AM  Result Value Ref Range   Glucose-Capillary 144 (H) 70 - 99 mg/dL   Comment 1 Notify RN   Glucose, capillary     Status: Abnormal   Collection Time: 02/25/15  4:20 PM  Result Value Ref Range   Glucose-Capillary 228 (H) 70 - 99 mg/dL   Comment 1 Notify RN   Glucose, capillary     Status: Abnormal   Collection Time: 02/25/15  8:58 PM  Result Value Ref Range   Glucose-Capillary 137 (H) 70 - 99 mg/dL  Clostridium Difficile by PCR     Status: None   Collection Time: 02/25/15  9:28 PM  Result Value Ref Range   C difficile by pcr NEGATIVE NEGATIVE  Basic metabolic panel     Status: Abnormal   Collection Time: 02/26/15  6:34 AM  Result Value Ref Range   Sodium 139 135 - 145 mmol/L   Potassium 4.6 3.5 - 5.1 mmol/L   Chloride 105 96 - 112 mmol/L   CO2 22 19 - 32 mmol/L   Glucose, Bld 164 (H) 70 - 99 mg/dL   BUN 48 (H) 6 - 23 mg/dL   Creatinine, Ser 2.00 (H) 0.50 - 1.10 mg/dL   Calcium 9.8 8.4 - 10.5 mg/dL   GFR calc non Af Amer 26 (L) >90 mL/min   GFR calc Af Amer 30 (L) >90 mL/min    Comment: (NOTE) The eGFR has been calculated using the CKD EPI equation. This calculation has not been validated in all clinical situations. eGFR's persistently <90 mL/min signify possible Chronic Kidney Disease.    Anion gap 12 5 - 15  CBC with Differential/Platelet     Status: Abnormal   Collection Time: 02/26/15  6:34 AM  Result Value Ref Range   WBC 9.2 4.0 - 10.5 K/uL   RBC 3.95 3.87 - 5.11 MIL/uL   Hemoglobin 11.4 (L) 12.0 - 15.0 g/dL   HCT 35.4 (L) 36.0 - 46.0 %   MCV 89.6 78.0 - 100.0 fL   MCH 28.9 26.0 - 34.0 pg   MCHC 32.2 30.0 - 36.0 g/dL   RDW 13.9 11.5 - 15.5 %   Platelets 365 150 - 400 K/uL   Neutrophils Relative % 73 43 - 77 %   Neutro Abs 6.6 1.7 - 7.7 K/uL   Lymphocytes Relative 19 12 - 46 %   Lymphs Abs 1.8 0.7 - 4.0 K/uL   Monocytes Relative 7 3 - 12 %   Monocytes Absolute 0.7 0.1 - 1.0  K/uL   Eosinophils Relative 1 0 - 5 %   Eosinophils Absolute 0.1 0.0 - 0.7 K/uL   Basophils Relative 0 0 - 1 %   Basophils Absolute 0.0 0.0 - 0.1 K/uL  Glucose, capillary     Status: Abnormal   Collection Time: 02/26/15  7:50 AM  Result Value Ref Range   Glucose-Capillary 139 (H) 70 - 99 mg/dL  Glucose, capillary     Status: Abnormal   Collection Time: 02/26/15 11:24 AM  Result Value Ref Range   Glucose-Capillary 168 (H) 70 - 99 mg/dL  Glucose, capillary     Status: Abnormal   Collection Time: 02/26/15  4:25 PM  Result Value Ref Range   Glucose-Capillary 51 (L) 70 - 99 mg/dL  Glucose, capillary     Status: Abnormal   Collection Time: 02/26/15  6:39 PM  Result Value Ref Range   Glucose-Capillary 63 (L) 70 - 99 mg/dL  Glucose, capillary     Status: None   Collection Time: 02/26/15  7:20 PM  Result Value Ref Range   Glucose-Capillary 70 70 - 99 mg/dL  Glucose, capillary     Status: Abnormal   Collection Time: 02/26/15  9:07 PM  Result Value Ref Range    Glucose-Capillary 68 (L) 70 - 99 mg/dL  Glucose, capillary     Status: None   Collection Time: 02/26/15  9:52 PM  Result Value Ref Range   Glucose-Capillary 80 70 - 99 mg/dL  Glucose, capillary     Status: Abnormal   Collection Time: 02/27/15  6:36 AM  Result Value Ref Range   Glucose-Capillary 115 (H) 70 - 99 mg/dL  Creatinine, serum     Status: Abnormal   Collection Time: 02/27/15  6:37 AM  Result Value Ref Range   Creatinine, Ser 1.88 (H) 0.50 - 1.10 mg/dL   GFR calc non Af Amer 28 (L) >90 mL/min   GFR calc Af Amer 32 (L) >90 mL/min    Comment: (NOTE) The eGFR has been calculated using the CKD EPI equation. This calculation has not been validated in all clinical situations. eGFR's persistently <90 mL/min signify possible Chronic Kidney Disease.   Glucose, capillary     Status: Abnormal   Collection Time: 02/27/15 11:17 AM  Result Value Ref Range   Glucose-Capillary 152 (H) 70 - 99 mg/dL  Glucose, capillary     Status: None   Collection Time: 02/27/15  4:21 PM  Result Value Ref Range   Glucose-Capillary 89 70 - 99 mg/dL  Glucose, capillary     Status: Abnormal   Collection Time: 02/27/15  9:01 PM  Result Value Ref Range   Glucose-Capillary 161 (H) 70 - 99 mg/dL  Glucose, capillary     Status: None   Collection Time: 02/28/15  6:49 AM  Result Value Ref Range   Glucose-Capillary 81 70 - 99 mg/dL     HEENT: poor dentition Cardio: RRR and no murmur Resp: CTA B/L and unlabored GI: BS positive and mild left upper quad tenderness, mild distention Extremity:  Pulses positive and No Edema Skin:   Wound Right post leg 1.5cm granulating area no odor Neuro: Alert/Oriented, Cranial Nerve II-XII normal, Normal Sensory, Abnormal Motor 4-/5 R delt bi tri grip,4 L HF , KE ankle DF, R 5/5 except Right ankle limited by pertial foot amp and Abnormal FMC Ataxic/ dec FMC Musc/Skel:  Other Right trans met amp Gen NAD   Assessment/Plan: 1. Functional deficits secondary to right  lenticular nucleus/corona radiate thrombotic  infarct  which require 3+ hours per day of interdisciplinary therapy in a comprehensive inpatient rehab setting. Physiatrist is providing close team supervision and 24 hour management of active medical problems listed below. Physiatrist and rehab team continue to assess barriers to discharge/monitor patient progress toward functional and medical goals.  FIM: FIM - Bathing Bathing Steps Patient Completed: Chest, Right Arm, Left Arm, Abdomen, Front perineal area, Buttocks, Right upper leg, Left upper leg, Right lower leg (including foot), Left lower leg (including foot) Bathing: 4: Steadying assist  FIM - Upper Body Dressing/Undressing Upper body dressing/undressing steps patient completed: Thread/unthread right sleeve of pullover shirt/dresss, Thread/unthread left sleeve of pullover shirt/dress, Put head through opening of pull over shirt/dress, Pull shirt over trunk Upper body dressing/undressing: 5: Supervision: Safety issues/verbal cues FIM - Lower Body Dressing/Undressing Lower body dressing/undressing steps patient completed: Don/Doff right sock, Don/Doff left sock, Don/Doff right shoe, Don/Doff left shoe, Fasten/unfasten right shoe, Fasten/unfasten left shoe, Thread/unthread right pants leg, Thread/unthread left pants leg, Pull pants up/down Lower body dressing/undressing: 4: Min-Patient completed 75 plus % of tasks  FIM - Toileting Toileting steps completed by patient: Performs perineal hygiene, Adjust clothing prior to toileting, Adjust clothing after toileting Toileting Assistive Devices: Grab bar or rail for support Toileting: 4: Steadying assist  FIM - Radio producer Devices: Grab bars Toilet Transfers: 3-To toilet/BSC: Mod A (lift or lower assist), 4-From toilet/BSC: Min A (steadying Pt. > 75%)  FIM - Bed/Chair Transfer Bed/Chair Transfer Assistive Devices: Arm rests Bed/Chair Transfer: 4: Bed > Chair or  W/C: Min A (steadying Pt. > 75%), 4: Chair or W/C > Bed: Min A (steadying Pt. > 75%)  FIM - Locomotion: Wheelchair Distance: 40 Locomotion: Wheelchair: 2: Travels 50 - 149 ft with moderate assistance (Pt: 50 - 74%) FIM - Locomotion: Ambulation Locomotion: Ambulation Assistive Devices: Lite Gait Ambulation/Gait Assistance: 1: +2 Total assist, 3: Mod assist (ModA of 1 for gait, +2 for w/c follow) Locomotion: Ambulation: 1: Two helpers  Comprehension Comprehension Mode: Auditory Comprehension: 5-Understands complex 90% of the time/Cues < 10% of the time  Expression Expression Mode: Verbal Expression: 5-Expresses complex 90% of the time/cues < 10% of the time  Social Interaction Social Interaction: 5-Interacts appropriately 90% of the time - Needs monitoring or encouragement for participation or interaction.  Problem Solving Problem Solving: 5-Solves complex 90% of the time/cues < 10% of the time  Memory Memory: 4-Recognizes or recalls 75 - 89% of the time/requires cueing 10 - 24% of the time  Medical Problem List and Plan: 1. Functional deficits secondary to hypertensive right lenticular nucleus/corona radiate infarct 2. DVT Prophylaxis/Anticoagulation: Pharmaceutical: Lovenox 3. Pain Management: neurontin for peripheral neuropathy.  4. Bipolar disorder/Mood: Appears stable. Continue Lamictal bid. Klonopin as needed prn anxiety.  5. Neuropsych: This patient is capable of making decisions on her own behalf. 6. Skin/Wound Care: Continue santyl to ulcer on right shin. Routine pressure relief measures.  7. Fluids/Electrolytes/Nutrition: Monitor I/O.  Will add IVF given elevated BUN and Creat, still some elevation of creat, change IVF to noc, will recheck BMET 8. DM type 2 with peripheral neuropathy : Monitor BS with ac/hs checks. Continue Trajenta, amaryl bid and lantus with SSI for elevated BS.   -sugars improving---continue with current regimen 9. CKD stage III: Monitor for  stability. Labs stable.  10. Dyslipidemia: On Zocor. 11. HTN: Monitor BP every 8 hours. Continue norvasc and lisinopril.  12.  S/P  Right partial foot amp 13.  Diarrhea, another episode of diarrhea, nausea ,  no vomiting recheck C Diff, recheck BMET LOS (Days) [place] E 02/28/2015, 8:18 AM

## 2015-03-01 ENCOUNTER — Inpatient Hospital Stay (HOSPITAL_COMMUNITY): Payer: BC Managed Care – PPO

## 2015-03-01 ENCOUNTER — Inpatient Hospital Stay (HOSPITAL_COMMUNITY): Payer: BC Managed Care – PPO | Admitting: Occupational Therapy

## 2015-03-01 ENCOUNTER — Inpatient Hospital Stay (HOSPITAL_COMMUNITY): Payer: Self-pay | Admitting: Speech Pathology

## 2015-03-01 ENCOUNTER — Inpatient Hospital Stay (HOSPITAL_COMMUNITY): Payer: Self-pay

## 2015-03-01 ENCOUNTER — Inpatient Hospital Stay (HOSPITAL_COMMUNITY): Payer: Self-pay | Admitting: Occupational Therapy

## 2015-03-01 DIAGNOSIS — R197 Diarrhea, unspecified: Secondary | ICD-10-CM

## 2015-03-01 DIAGNOSIS — R112 Nausea with vomiting, unspecified: Secondary | ICD-10-CM | POA: Diagnosis present

## 2015-03-01 DIAGNOSIS — R5081 Fever presenting with conditions classified elsewhere: Secondary | ICD-10-CM | POA: Diagnosis not present

## 2015-03-01 DIAGNOSIS — N179 Acute kidney failure, unspecified: Secondary | ICD-10-CM | POA: Diagnosis present

## 2015-03-01 DIAGNOSIS — R1012 Left upper quadrant pain: Secondary | ICD-10-CM

## 2015-03-01 DIAGNOSIS — N170 Acute kidney failure with tubular necrosis: Secondary | ICD-10-CM

## 2015-03-01 LAB — URINALYSIS, ROUTINE W REFLEX MICROSCOPIC
BILIRUBIN URINE: NEGATIVE
Glucose, UA: NEGATIVE mg/dL
Hgb urine dipstick: NEGATIVE
KETONES UR: NEGATIVE mg/dL
Nitrite: NEGATIVE
PH: 5 (ref 5.0–8.0)
Protein, ur: 30 mg/dL — AB
SPECIFIC GRAVITY, URINE: 1.008 (ref 1.005–1.030)
Urobilinogen, UA: 0.2 mg/dL (ref 0.0–1.0)

## 2015-03-01 LAB — URINE MICROSCOPIC-ADD ON

## 2015-03-01 LAB — HEPATIC FUNCTION PANEL
ALT: 59 U/L — ABNORMAL HIGH (ref 0–35)
AST: 29 U/L (ref 0–37)
Albumin: 3.9 g/dL (ref 3.5–5.2)
Alkaline Phosphatase: 74 U/L (ref 39–117)
Bilirubin, Direct: <0.1 mg/dL (ref 0.0–0.5)
TOTAL PROTEIN: 7.3 g/dL (ref 6.0–8.3)
Total Bilirubin: 0.4 mg/dL (ref 0.3–1.2)

## 2015-03-01 LAB — GLUCOSE, CAPILLARY
GLUCOSE-CAPILLARY: 110 mg/dL — AB (ref 70–99)
GLUCOSE-CAPILLARY: 112 mg/dL — AB (ref 70–99)
Glucose-Capillary: 138 mg/dL — ABNORMAL HIGH (ref 70–99)
Glucose-Capillary: 88 mg/dL (ref 70–99)

## 2015-03-01 LAB — CBC WITH DIFFERENTIAL/PLATELET
Basophils Absolute: 0 10*3/uL (ref 0.0–0.1)
Basophils Relative: 0 % (ref 0–1)
EOS PCT: 2 % (ref 0–5)
Eosinophils Absolute: 0.1 10*3/uL (ref 0.0–0.7)
HCT: 34.6 % — ABNORMAL LOW (ref 36.0–46.0)
Hemoglobin: 11.2 g/dL — ABNORMAL LOW (ref 12.0–15.0)
Lymphocytes Relative: 16 % (ref 12–46)
Lymphs Abs: 1 10*3/uL (ref 0.7–4.0)
MCH: 28.2 pg (ref 26.0–34.0)
MCHC: 32.4 g/dL (ref 30.0–36.0)
MCV: 87.2 fL (ref 78.0–100.0)
Monocytes Absolute: 0.5 10*3/uL (ref 0.1–1.0)
Monocytes Relative: 7 % (ref 3–12)
NEUTROS PCT: 75 % (ref 43–77)
Neutro Abs: 5 10*3/uL (ref 1.7–7.7)
PLATELETS: 340 10*3/uL (ref 150–400)
RBC: 3.97 MIL/uL (ref 3.87–5.11)
RDW: 13.8 % (ref 11.5–15.5)
WBC: 6.7 10*3/uL (ref 4.0–10.5)

## 2015-03-01 LAB — BASIC METABOLIC PANEL
Anion gap: 14 (ref 5–15)
BUN: 40 mg/dL — ABNORMAL HIGH (ref 6–23)
CALCIUM: 9.3 mg/dL (ref 8.4–10.5)
CO2: 18 mmol/L — AB (ref 19–32)
Chloride: 105 mmol/L (ref 96–112)
Creatinine, Ser: 2.45 mg/dL — ABNORMAL HIGH (ref 0.50–1.10)
GFR calc Af Amer: 23 mL/min — ABNORMAL LOW (ref >90)
GFR calc non Af Amer: 20 mL/min — ABNORMAL LOW (ref >90)
GLUCOSE: 147 mg/dL — AB (ref 70–99)
Potassium: 4 mmol/L (ref 3.5–5.1)
Sodium: 137 mmol/L (ref 135–145)

## 2015-03-01 LAB — LIPASE, BLOOD: Lipase: 47 U/L (ref 11–59)

## 2015-03-01 MED ORDER — SODIUM CHLORIDE 0.9 % IV SOLN
INTRAVENOUS | Status: AC
  Administered 2015-03-01: 16:00:00 via INTRAVENOUS
  Administered 2015-03-02: 1000 mL via INTRAVENOUS

## 2015-03-01 MED ORDER — ONDANSETRON HCL 4 MG/2ML IJ SOLN
4.0000 mg | Freq: Four times a day (QID) | INTRAMUSCULAR | Status: DC | PRN
  Administered 2015-03-04: 4 mg via INTRAVENOUS
  Filled 2015-03-01: qty 2

## 2015-03-01 MED ORDER — PROMETHAZINE HCL 12.5 MG PO TABS: 12.5000 mg | ORAL_TABLET | Freq: Three times a day (TID) | ORAL | Status: DC | PRN

## 2015-03-01 MED ORDER — HEPARIN SODIUM (PORCINE) 5000 UNIT/ML IJ SOLN
5000.0000 [IU] | Freq: Three times a day (TID) | INTRAMUSCULAR | Status: DC
  Administered 2015-03-01 – 2015-03-08 (×21): 5000 [IU] via SUBCUTANEOUS
  Filled 2015-03-01 (×24): qty 1

## 2015-03-01 MED ORDER — SODIUM CHLORIDE 0.9 % IV BOLUS (SEPSIS)
1000.0000 mL | Freq: Once | INTRAVENOUS | Status: AC
  Administered 2015-03-01: 1000 mL via INTRAVENOUS

## 2015-03-01 MED ORDER — METRONIDAZOLE IN NACL 5-0.79 MG/ML-% IV SOLN
500.0000 mg | Freq: Three times a day (TID) | INTRAVENOUS | Status: DC
  Administered 2015-03-01 – 2015-03-04 (×8): 500 mg via INTRAVENOUS
  Filled 2015-03-01 (×12): qty 100

## 2015-03-01 MED ORDER — ONDANSETRON HCL 4 MG PO TABS: 4.0000 mg | ORAL_TABLET | Freq: Four times a day (QID) | ORAL | Status: DC | PRN

## 2015-03-01 MED ORDER — CIPROFLOXACIN IN D5W 400 MG/200ML IV SOLN
400.0000 mg | Freq: Two times a day (BID) | INTRAVENOUS | Status: DC
  Administered 2015-03-01 – 2015-03-03 (×6): 400 mg via INTRAVENOUS
  Filled 2015-03-01 (×9): qty 200

## 2015-03-01 MED ORDER — IOHEXOL 300 MG/ML  SOLN
25.0000 mL | INTRAMUSCULAR | Status: AC
  Administered 2015-03-01 (×2): 25 mL via ORAL

## 2015-03-01 MED ORDER — ONDANSETRON HCL 4 MG/2ML IJ SOLN
4.0000 mg | Freq: Three times a day (TID) | INTRAMUSCULAR | Status: DC
  Administered 2015-03-01 – 2015-03-04 (×6): 4 mg via INTRAVENOUS
  Filled 2015-03-01 (×7): qty 2

## 2015-03-01 MED ORDER — SODIUM CHLORIDE 0.45 % IV SOLN: INTRAVENOUS | Status: DC

## 2015-03-01 NOTE — Progress Notes (Signed)
Physical Therapy Note  Patient Details  Name: Cyndia Degraff MRN: [id number] Date of Birth: [birth year] Today's Date: 03/01/2015    Patient missed 60 minutes of PT. "I am running a fever and am not working with you today". " I am tired of arguing with you."  PT attempted to get patient to participate in bed level activities. Patient refused.   Elder Love M 03/01/2015, 9:21 AM

## 2015-03-01 NOTE — Progress Notes (Signed)
Occupational Therapy Session Note  Patient Details  Name: [name] MRN: [id number] Date of Birth: 01-Jun-1951  Today's Date: 03/01/2015 OT Missed Time:  60 Missed Time Reason:  Pt fatigued due to illness   Short Term Goals: Week 2:  OT Short Term Goal 1 (Week 2): Pt will complete tub/shower transfer with supervision with RW OT Short Term Goal 2 (Week 2): Pt will complete toilet transfer with supervision with RW OT Short Term Goal 3 (Week 2): Pt will complete grooming tasks in standing with use of LUE as gross assist  Skilled Therapeutic Interventions/Progress Updates:   Therapist made attempt to see pt for skilled session. However, refused due to continued diarrhea and overnight onset of fever.  As a result, pt missed 60 minutes of OT.   Therapy Documentation Precautions:  Precautions Precautions: Fall Precaution Comments: Rt mid foot amputation Restrictions Weight Bearing Restrictions: No General:   Vital Signs: Therapy Vitals Temp: (!) 101.5 F (38.6 C) Temp Source: Oral Pulse Rate: 100 Resp: 18 BP: (!) 119/53 mmHg Patient Position (if appropriate): Orthostatic Vitals Pain:   ADL:   Exercises:   Other Treatments:    See FIM for current functional status  Therapy/Group: Individual Therapy  Breta Demedeiros 03/01/2015, 7:53 AM

## 2015-03-01 NOTE — Consult Note (Signed)
[facility] Medical Consultation  [name]:[id number] DOB: [birth year] DOA: 02/20/2015 PCP: Donia Ast, FNP   Requesting physician: Reesa Chew PA-C Date of consultation: 03/01/15 Reason for consultation: Diarrhea   Impression/Recommendations Principal Problem: Acute renal failure -Baseline creatinine 1.4.  Creatinine 2.45 on 4/22.  -Likely due to dehydration from diarrhea and vomiting, exacerbated by medications. -Increased iv fluids -Stop Lisinopril.  Change lovenox to heparin. -Follow daily  Active Problems: Diarrhea with vomiting -Likely infectious vs microscopic  -Pt has been having several mucousy green stools per day with increased frequency at night. -Increased IV fluids to compensate for increased losses  -Cipro and Flagyl for presumptive infection -GI pathogen panel ordered.  C-diff negative and noro-virus negative. -CT of abdomen with oral contrast ordered -Await results of labs and CT and adjust treatment plan accordingly.  May need to consider [facility] consult for flex sig w bx  if no improvement.   DM type 2 with diabetic peripheral neuropathy -Currently well controlled CBGs -Continue current insulin regimen.  Monitor closely for hypoglycemia as patient is developing renal failure and is on lantus. -Most recent Hgb A1c was 8.9 on 4/9   CVA (cerebral infarction) with Left hemiparesis -continue 325 mg ASA, and CIR      Nausea  -Zofran before meals.  Phenergan PO between meals if needed.     Fever presenting with conditions classified elsewhere -Antibiotic for infection (Cipro/Flagyl started 4/22) -tylenol every 4 hours prn   TR[facility] will followup again tomorrow. Please contact me if I can be of assistance in the meanwhile. Thank you for this consultation.  Chief Complaint: Diarrhea   HPI:   Tara Hopkins is a 64 year old female with a PMH of DM with neuropathy, HTN, hypercholesteremia, and nonhemorrhagic stroke.  We were  consulted on her while she  was in rehab due to an ischemic stroke which left her with left sided weakness.   She has a 4 day history of diarrhea with vomiting. The diarrhea was so severe Monday night when it started that she required a rectal tube.  By Tuesday the volume of diarrhea had decreased.  Now, Tara Hopkins states she has vomiting and diarrhea every time she eats or drinks. The diarrhea is worse at night.  Per the RN, the patient has small amounts of green mucous stool.   Stool was negative for cdiff as of 02/28/15, and pcr was also negative for norovirus on 02/26/15.  X-ray of abdomen on 02/26/15 showed no evidence of obstruction.  She has mild tenderness to palpation in the LLQ.  Over the past week her creatinine has trended upward to 2.45.  Her baseline is approximately 1.40.  She had a colonoscopy on 01/01/15 that showed 2 polyps in the rectum and sigmoid colon with mild diverticulosis.  The examination was otherwise unremarkable.  She has a history of cocaine use.  Review of Systems:  +headache, LLQ abdominal tenderness, N/V/D.  She denies chest pain, palpitations, SOB, and any pain in lower extremities.    Past Medical History  Diagnosis Date  . Diabetes mellitus without complication   . Hypercholesteremia   . Peripheral neuropathy   . Hypertension     not on medications any longer  . Anxiety   . Depression   . History of blood transfusion   . History of hyperbaric oxygen therapy     pt reports 80 treatments.  . Anemia   . Glaucoma    Past Surgical History  Procedure Laterality Date  .  I&d extremity Right 05/23/2013    Procedure: IRRIGATION AND DEBRIDEMENT RIGHT GREAT TOE;  Surgeon: Mauri Pole, MD;  Location: [facility];  Service: Orthopedics;  Laterality: Right;  . Amputation Right 12/15/2013    Procedure: Right Transmetatarsal Amputation;  Surgeon: Newt Minion, MD;  Location: [facility];  Service: Orthopedics;  Laterality: Right;  . Amputation Right 02/23/2014    Procedure:  AMPUTATION FOOT;  Surgeon: Newt Minion, MD;  Location: [facility];  Service: Orthopedics;  Laterality: Right;  Right Foot Revision Transmetatarsal Amputation   Social History:  reports that she has been smoking Cigarettes.  She has a .2 pack-year smoking history. She has never used smokeless tobacco. She reports that she drinks alcohol. She reports that she does not use illicit drugs.   However, she was positive for cocaine on admission.    No Known Allergies Family History  Problem Relation Age of Onset  . Colon cancer Neg Hx   . Liver cancer Mother   . Lung cancer Mother     Prior to Admission medications   Medication Sig Start Date End Date Taking? Authorizing Provider  acetaminophen (TYLENOL) 500 MG tablet Take 1,000 mg by mouth every 6 (six) hours as needed for headache.    Historical Provider, MD  clonazePAM (KLONOPIN) 0.5 MG tablet take 1 tablet by mouth three times a day if needed Patient taking differently: take 1 tablet by mouth three times a day if needed for anxiety 01/01/15   Timoteo Gaul, FNP  COD LIVER OIL PO Take 1 capsule by mouth daily.    Historical Provider, MD  collagenase (SANTYL) ointment Apply 1 application topically at bedtime. Apply to right ankle for wound care    Historical Provider, MD  gabapentin (NEURONTIN) 400 MG capsule Take 2 capsules (800 mg total) by mouth 2 (two) times daily. 10/22/14   Timoteo Gaul, FNP  glimepiride (AMARYL) 4 MG tablet Take 1 tablet (4 mg total) by mouth daily before breakfast. 09/28/14   Timoteo Gaul, FNP  glucose blood test strip Use as instructed 06/01/14   Timoteo Gaul, FNP  insulin aspart (NOVOLOG FLEXPEN) 100 UNIT/ML FlexPen Inject 2-6 units into skin 3 times daily Patient taking differently: Inject 4 Units into the skin 3 (three) times daily as needed for high blood sugar (CBG>130).  09/28/14   Timoteo Gaul, FNP  insulin glargine (LANTUS) 100 UNIT/ML injection Inject 0.3 mLs (30 Units total) into the skin  at bedtime. 12/04/14   Timoteo Gaul, FNP  lamoTRIgine (LAMICTAL) 100 MG tablet take 1/2 tablet by mouth twice a day 02/25/15   Timoteo Gaul, FNP  latanoprost (XALATAN) 0.005 % ophthalmic solution place 1 drop into both eyes at bedtime 08/06/14   Timoteo Gaul, FNP  lisinopril (PRINIVIL,ZESTRIL) 10 MG tablet take 1 tablet by mouth once daily 12/28/14   Timoteo Gaul, FNP  ONE TOUCH LANCETS MISC USE TO CHECK BLOOD SUGAR 3 TIMES DAILY 06/01/14   Timoteo Gaul, FNP  saxagliptin HCl (ONGLYZA) 5 MG TABS tablet Take 1 tablet (5 mg total) by mouth daily. Patient not taking: Reported on 02/17/2015 09/28/14   Timoteo Gaul, FNP  simvastatin (ZOCOR) 20 MG tablet Take 1 tablet (20 mg total) by mouth at bedtime. 09/28/14   Timoteo Gaul, FNP  white petrolatum (VASELINE) GEL Apply 1 application topically at bedtime. Apply to both feet    Historical Provider, MD  zinc gluconate 50 MG tablet Take 50 mg by  mouth 2 (two) times daily.    Historical Provider, MD   Physical Exam: Blood pressure 106/86, pulse 100, temperature 99.5 F (37.5 C), temperature source Oral, resp. rate 18, SpO2 99 %. Filed Vitals:   03/01/15 1018  BP: 106/86  Pulse:   Temp: 99.5 F (37.5 C)  Resp:      General:  Patient is lying in bed comfortably.  She is in good spirits and does not appear to be in any pain.  Eyes: Clear conjunctiva, no erythema noted  Neck: No jvd present   Cardiovascular: Regular rate and rhythm.  No murmurs.  Respiratory: CTA B/L. Unlabored  Abdomen: Soft non distended. Some pain in LLQ  Skin: Erythematous rash present on abdomen.    Musculoskeletal: weakness noted in left arm.  No edema in LE.   Labs on Admission:  Basic Metabolic Panel:  Recent Labs Lab 02/26/15 0634 02/27/15 0637 02/28/15 0956 03/01/15 1022  NA 139  --  137 137  K 4.6  --  4.1 4.0  CL 105  --  107 105  CO2 22  --  21 18*  GLUCOSE 164*  --  125* 147*  BUN 48*  --  36* 40*  CREATININE  2.00* 1.88* 1.70* 2.45*  CALCIUM 9.8  --  9.4 9.3   CBC:   Recent Labs Lab 02/26/15 0634 03/01/15 1022  WBC 9.2 6.7  NEUTROABS 6.6 5.0  HGB 11.4* 11.2*  HCT 35.4* 34.6*  MCV 89.6 87.2  PLT 365 340   CBG:  Recent Labs Lab 02/28/15 1128 02/28/15 1622 02/28/15 2127 02/28/15 2208 03/01/15 0704  GLUCAP 136* 125* 63* 95 138*    Radiological Exams on Admission: No results found.  EKG not done  Time spent: 60 min.  Marlou Starks [place] PA-S Imogene Burn, [place] [facility] Pager [phone]  If 7PM-7AM, please contact night-coverage [email or web address] Password [facility] 03/01/2015, 11:29 AM

## 2015-03-01 NOTE — Progress Notes (Signed)
Speech Language Pathology Daily Session Note  Patient Details  Name: Tara Hopkins MRN: [id number] Date of Birth: [birth year]  Today's Date: 03/01/2015 SLP Individual Time: 7505-1833 SLP Individual Time Calculation (min): 30 min  Short Term Goals: Week 2: SLP Short Term Goal 1 (Week 2): Pt will recall at least 2 details of new information using external aids with functional daily living tasks with mod I. SLP Short Term Goal 2 (Week 2): Pt will demonstrate appropriate safety awareness to complex situations and/or tasks with functional tasks with supervision. SLP Short Term Goal 3 (Week 2): Pt will demonstrate appropriate executive function skills to participate in semi-complex functional home management tasks with supervision.  Skilled Therapeutic Interventions: Skilled treatment session focused on cognitive goals. Patient was seen at later time than scheduled secondary to vomitting and diarrhea earlier this morning, which apparently has been going on for the past several days. Patient was seen for treatment session in her room, she was sitting in her chair with her niece present in the room. Patient was pleasant but worried that she will get sick if she eats or drinks anything. Session focused on recalling recent events/therapeutic interventions, and complex-level awareness and discussion of impairments/medical issues. Patient recalled recent therapy sessions with SLP, OT, PT and described in detail with minimal follow up question prompts. Patient is able to describe her impairments and how they impact her function with minimal cues. Continue with current plan of care.   FIM:  Comprehension Comprehension Mode: Auditory Comprehension: 5-Understands complex 90% of the time/Cues < 10% of the time Expression Expression Mode: Verbal Expression: 5-Expresses complex 90% of the time/cues < 10% of the time Social Interaction Social Interaction: 5-Interacts appropriately 90% of the time - Needs  monitoring or encouragement for participation or interaction. Problem Solving Problem Solving: 5-Solves complex 90% of the time/cues < 10% of the time Memory Memory: 5-Recognizes or recalls 90% of the time/requires cueing < 10% of the time  Pain Pain Assessment Pain Assessment: No/denies pain Pain Score: 0-No pain  Therapy/Group: Individual Therapy  Dannial Monarch 03/01/2015, 2:54 PM   Sonia Baller, MA, CCC-SLP 03/01/2015 2:54 PM

## 2015-03-01 NOTE — Progress Notes (Signed)
64 y.o. RH-female with history of HTN, DM, peripheral neuropathy, anxiety disorder; who was admitted via EMS on 02/15/15 with acute onset of left sided weakness and slurred speech. Upon EMS evaluation BP 220/130 and CBG of 224. She was given labetalol 62m IV x 1 with improvement of BP to 167/71. UDS positive for cocaine. CT head negative and tPA administered. MRI/MRA brain was done revealing acute nonhemorrhagic infarcts in posterior aspect of right lenticular nucleus to posterior corona radiata and posterior limb of L-IC.    Subjective/Complaints: Diarrhea overnite, no vomiting but feels nauseated, pain on left side of abd Had Colonoscopy Feb 2015, diverticulosis, small polyp, f/u in 5 yrs rec KUB on 4/19 unremarkable ROS - weakness Left side but moving LLE better Objective: Vital Signs: Blood pressure 119/53, pulse 100, temperature 101.5 F (38.6 C), temperature source Oral, resp. rate 18, SpO2 99 %. No results found. Results for orders placed or performed during the hospital encounter of 02/20/15 (from the past 72 hour(s))  Norovirus group 1 & 2 by PCR, stool     Status: None   Collection Time: 02/26/15  9:36 AM  Result Value Ref Range   Norovirus 1 by PCR Negative Negative   Norovirus 2  by PCR Negative Negative    Comment: (NOTE) This test was developed and its performance characteristics determined by [facility].  It has not been cleared or approved by the Food and Drug Administration.  The FDA has determined that such clearance or approval is not necessary. Performed At: [id number] RompMD PPN:[id number]  Glucose, capillary     Status: Abnormal   Collection Time: 02/26/15 11:24 AM  Result Value Ref Range   Glucose-Capillary 168 (H) 70 - 99 mg/dL  Glucose, capillary     Status: Abnormal   Collection Time: 02/26/15  4:25 PM  Result Value Ref Range   Glucose-Capillary 51 (L) 70 - 99 mg/dL  Glucose, capillary      Status: Abnormal   Collection Time: 02/26/15  6:39 PM  Result Value Ref Range   Glucose-Capillary 63 (L) 70 - 99 mg/dL  Glucose, capillary     Status: None   Collection Time: 02/26/15  7:20 PM  Result Value Ref Range   Glucose-Capillary 70 70 - 99 mg/dL  Glucose, capillary     Status: Abnormal   Collection Time: 02/26/15  9:07 PM  Result Value Ref Range   Glucose-Capillary 68 (L) 70 - 99 mg/dL  Glucose, capillary     Status: None   Collection Time: 02/26/15  9:52 PM  Result Value Ref Range   Glucose-Capillary 80 70 - 99 mg/dL  Glucose, capillary     Status: Abnormal   Collection Time: 02/27/15  6:36 AM  Result Value Ref Range   Glucose-Capillary 115 (H) 70 - 99 mg/dL  Creatinine, serum     Status: Abnormal   Collection Time: 02/27/15  6:37 AM  Result Value Ref Range   Creatinine, Ser 1.88 (H) 0.50 - 1.10 mg/dL   GFR calc non Af Amer 28 (L) >90 mL/min   GFR calc Af Amer 32 (L) >90 mL/min    Comment: (NOTE) The eGFR has been calculated using the CKD EPI equation. This calculation has not been validated in all clinical situations. eGFR's persistently <90 mL/min signify possible Chronic Kidney Disease.   Glucose, capillary     Status: Abnormal   Collection Time: 02/27/15 11:17 AM  Result Value Ref Range  Glucose-Capillary 152 (H) 70 - 99 mg/dL  Glucose, capillary     Status: None   Collection Time: 02/27/15  4:21 PM  Result Value Ref Range   Glucose-Capillary 89 70 - 99 mg/dL  Glucose, capillary     Status: Abnormal   Collection Time: 02/27/15  9:01 PM  Result Value Ref Range   Glucose-Capillary 161 (H) 70 - 99 mg/dL  Glucose, capillary     Status: None   Collection Time: 02/28/15  6:49 AM  Result Value Ref Range   Glucose-Capillary 81 70 - 99 mg/dL  Clostridium Difficile by PCR     Status: None   Collection Time: 02/28/15  9:31 AM  Result Value Ref Range   C difficile by pcr NEGATIVE NEGATIVE  Basic metabolic panel     Status: Abnormal   Collection Time: 02/28/15   9:56 AM  Result Value Ref Range   Sodium 137 135 - 145 mmol/L   Potassium 4.1 3.5 - 5.1 mmol/L   Chloride 107 96 - 112 mmol/L   CO2 21 19 - 32 mmol/L   Glucose, Bld 125 (H) 70 - 99 mg/dL   BUN 36 (H) 6 - 23 mg/dL   Creatinine, Ser 1.70 (H) 0.50 - 1.10 mg/dL   Calcium 9.4 8.4 - 10.5 mg/dL   GFR calc non Af Amer 31 (L) >90 mL/min   GFR calc Af Amer 36 (L) >90 mL/min    Comment: (NOTE) The eGFR has been calculated using the CKD EPI equation. This calculation has not been validated in all clinical situations. eGFR's persistently <90 mL/min signify possible Chronic Kidney Disease.    Anion gap 9 5 - 15  Glucose, capillary     Status: Abnormal   Collection Time: 02/28/15 11:28 AM  Result Value Ref Range   Glucose-Capillary 136 (H) 70 - 99 mg/dL  Glucose, capillary     Status: Abnormal   Collection Time: 02/28/15  4:22 PM  Result Value Ref Range   Glucose-Capillary 125 (H) 70 - 99 mg/dL  Glucose, capillary     Status: Abnormal   Collection Time: 02/28/15  9:27 PM  Result Value Ref Range   Glucose-Capillary 63 (L) 70 - 99 mg/dL   Comment 1 Notify RN   Glucose, capillary     Status: None   Collection Time: 02/28/15 10:08 PM  Result Value Ref Range   Glucose-Capillary 95 70 - 99 mg/dL   Comment 1 Notify RN   Glucose, capillary     Status: Abnormal   Collection Time: 03/01/15  7:04 AM  Result Value Ref Range   Glucose-Capillary 138 (H) 70 - 99 mg/dL   Comment 1 Notify RN      HEENT: poor dentition Cardio: RRR and no murmur Resp: CTA B/L and unlabored GI: BS positive and mild left upper quad tenderness, mild distention Extremity:  Pulses positive and No Edema Skin:   Wound Right post leg 1.5cm granulating area no odor Neuro: Alert/Oriented, Cranial Nerve II-XII normal, Normal Sensory, Abnormal Motor 4-/5 R delt bi tri grip,4 L HF , KE ankle DF, R 5/5 except Right ankle limited by pertial foot amp and Abnormal FMC Ataxic/ dec FMC Musc/Skel:  Other Right trans met amp Gen  NAD   Assessment/Plan: 1. Functional deficits secondary to right lenticular nucleus/corona radiate thrombotic infarct  which require 3+ hours per day of interdisciplinary therapy in a comprehensive inpatient rehab setting. Physiatrist is providing close team supervision and 24 hour management of active medical problems listed  below. Physiatrist and rehab team continue to assess barriers to discharge/monitor patient progress toward functional and medical goals. Fever , diarrhea, negative C diff and norovirus x 2 . Will recheck CBC and check BC, ask IM to eval FIM: FIM - Bathing Bathing Steps Patient Completed: Chest, Right Arm, Left Arm, Abdomen, Front perineal area, Buttocks, Right upper leg, Left upper leg, Right lower leg (including foot), Left lower leg (including foot) Bathing: 4: Steadying assist  FIM - Upper Body Dressing/Undressing Upper body dressing/undressing steps patient completed: Thread/unthread right sleeve of pullover shirt/dresss, Thread/unthread left sleeve of pullover shirt/dress, Put head through opening of pull over shirt/dress, Pull shirt over trunk Upper body dressing/undressing: 5: Supervision: Safety issues/verbal cues FIM - Lower Body Dressing/Undressing Lower body dressing/undressing steps patient completed: Don/Doff right sock, Don/Doff left sock, Don/Doff right shoe, Don/Doff left shoe, Fasten/unfasten right shoe, Fasten/unfasten left shoe, Thread/unthread right pants leg, Thread/unthread left pants leg, Pull pants up/down Lower body dressing/undressing: 4: Min-Patient completed 75 plus % of tasks  FIM - Toileting Toileting steps completed by patient: Performs perineal hygiene, Adjust clothing prior to toileting, Adjust clothing after toileting Toileting Assistive Devices: Grab bar or rail for support Toileting: 4: Steadying assist  FIM - Radio producer Devices: Grab bars Toilet Transfers: 3-To toilet/BSC: Mod A (lift or lower  assist), 4-From toilet/BSC: Min A (steadying Pt. > 75%)  FIM - Bed/Chair Transfer Bed/Chair Transfer Assistive Devices: Arm rests Bed/Chair Transfer: 4: Bed > Chair or W/C: Min A (steadying Pt. > 75%), 4: Chair or W/C > Bed: Min A (steadying Pt. > 75%)  FIM - Locomotion: Wheelchair Distance: 40 Locomotion: Wheelchair: 2: Travels 50 - 149 ft with moderate assistance (Pt: 50 - 74%) FIM - Locomotion: Ambulation Locomotion: Ambulation Assistive Devices: Lite Gait Ambulation/Gait Assistance: 1: +2 Total assist, 3: Mod assist (ModA of 1 for gait, +2 for w/c follow) Locomotion: Ambulation: 1: Two helpers  Comprehension Comprehension Mode: Auditory Comprehension: 5-Understands complex 90% of the time/Cues < 10% of the time  Expression Expression Mode: Verbal Expression: 5-Expresses complex 90% of the time/cues < 10% of the time  Social Interaction Social Interaction: 5-Interacts appropriately 90% of the time - Needs monitoring or encouragement for participation or interaction.  Problem Solving Problem Solving: 5-Solves complex 90% of the time/cues < 10% of the time  Memory Memory: 4-Recognizes or recalls 75 - 89% of the time/requires cueing 10 - 24% of the time  Medical Problem List and Plan: 1. Functional deficits secondary to hypertensive right lenticular nucleus/corona radiate infarct 2. DVT Prophylaxis/Anticoagulation: Pharmaceutical: Lovenox 3. Pain Management: neurontin for peripheral neuropathy.  4. Bipolar disorder/Mood: Appears stable. Continue Lamictal bid. Klonopin as needed prn anxiety.  5. Neuropsych: This patient is capable of making decisions on her own behalf. 6. Skin/Wound Care: Continue santyl to ulcer on right shin. Routine pressure relief measures.  7. Fluids/Electrolytes/Nutrition: Monitor I/O.  Will add IVF given elevated BUN and Creat, still some elevation of creat, change IVF to noc, monitor BMET 8. DM type 2 with peripheral neuropathy : Monitor BS with  ac/hs checks. Continue Trajenta, amaryl bid and lantus with SSI for elevated BS.   -sugars improving---continue with current regimen 9. CKD stage III: Monitor for stability. Labs stable.  10. Dyslipidemia: On Zocor. 11. HTN: Monitor BP every 8 hours. Continue norvasc and lisinopril.  12.  S/P  Right partial foot amp 13.  Diarrhea, another episode of diarrhea, nausea ,? Gastroenteritis, recent colonoscopy unremarkable, may treat symptomatically since C diff neg x  2 LOS (Days) 9 A FACE TO FACE EVALUATION WAS PERFORMED  KIRSTEINS,ANDREW E 03/01/2015, 8:08 AM

## 2015-03-01 NOTE — Progress Notes (Signed)
Occupational Therapy Session Note  Patient Details  Name: Tara Hopkins MRN: [id number] Date of Birth: 1951-01-11  Today's Date: 03/01/2015 OT Individual Time:  -   1030-1100  (30 min)  1st session                                           1500-1545 (68mn)  2nd session      Short Term Goals: Week 1:  OT Short Term Goal 1 (Week 1): Pt will complete tub/shower transfer with min assist with LRAD OT Short Term Goal 1 - Progress (Week 1): Partly met OT Short Term Goal 2 (Week 1): Pt will complete toilet transfer with min assist with LRAD OT Short Term Goal 2 - Progress (Week 1): Partly met OT Short Term Goal 3 (Week 1): Pt will complete UB dressing with supervision OT Short Term Goal 3 - Progress (Week 1): Met OT Short Term Goal 4 (Week 1): Pt will complete LB dressing with min assist OT Short Term Goal 4 - Progress (Week 1): Met OT Short Term Goal 5 (Week 1): Pt will complete grooming tasks in standing with use of LUE as gross assist OT Short Term Goal 5 - Progress (Week 1): Progressing toward goal Week 2:  OT Short Term Goal 1 (Week 2): Pt will complete tub/shower transfer with supervision with RW OT Short Term Goal 2 (Week 2): Pt will complete toilet transfer with supervision with RW OT Short Term Goal 3 (Week 2): Pt will complete grooming tasks in standing with use of LUE as gross assist  Skilled Therapeutic Interventions/Progress Updates:    1st session:    Pt not feeling well.  Stomach pain 6/10.   Addressed bed mobility and positioning in bed.     Pt. already bathed and dressed and not wanting to get up due to not feeling well.    Pt. Rolled to right and left with SBA and cues for positioning.  Pt. Able to slide self up to head of bed.  Left pt in bed with safety alarm and call bell,phone within reach.    Therapy Documentation Precautions:  Precautions Precautions: Fall Precaution Comments: Rt mid foot amputation Restrictions Weight Bearing Restrictions: No     Pain:   5/10 stomach  1st session;   0/10  2nd session         Other Treatments:    2nd session:    Time:1500-1545 (45 min) Pain:  none Individual session: Pt. lying bed.  Addressedd LUE/LLE NMRE; .  Pt received phone call but unable to figure out how to answer phone.   Her son is in prison and he can call her,but she can not call him.    Pt. Agreed to sit with HOB at 60 degrees and engage in forward pass.  Did forward pass for 5 minutes.  Remained in bed with  call bell,phone within reach.    See FIM for current functional status  Therapy/Group: Individual Therapy  Tara Roca4/22/2016, 11:05 AM

## 2015-03-01 NOTE — Progress Notes (Signed)
Occupational Therapy Note  Patient Details  Name: Tara Hopkins MRN: [id number] Date of Birth: 09-01-1951  Pt's plan of care adjusted to q.d  after speaking with care team and discussed with PA as pt currently unable to tolerate current therapy schedule with OT, PT, and SLP.    Willeen Cass [facility] 03/01/2015, 12:01 PM

## 2015-03-01 NOTE — Progress Notes (Signed)
Hypoglycemic Event  CBG: 63  Treatment: 15 GM carbohydrate snack  Symptoms: Sweaty and Shaky  Follow-up CBG: Time:2208 CBG Result:95  Possible Reasons for Event: Vomiting and Inadequate meal intake  Comments/MD notified: Note in chart     Nyx Keady L  Remember to initiate Hypoglycemia Order Set & complete

## 2015-03-01 NOTE — Progress Notes (Signed)
No nausea or stomach discomfort this shift. Loose stool x 2 this shift. Green, soft, seedy stool. Sample sent to lab per order to r/o infection.

## 2015-03-02 ENCOUNTER — Inpatient Hospital Stay (HOSPITAL_COMMUNITY): Payer: BC Managed Care – PPO | Admitting: *Deleted

## 2015-03-02 DIAGNOSIS — R112 Nausea with vomiting, unspecified: Secondary | ICD-10-CM

## 2015-03-02 DIAGNOSIS — N179 Acute kidney failure, unspecified: Secondary | ICD-10-CM

## 2015-03-02 LAB — GLUCOSE, CAPILLARY
GLUCOSE-CAPILLARY: 107 mg/dL — AB (ref 70–99)
GLUCOSE-CAPILLARY: 70 mg/dL (ref 70–99)
Glucose-Capillary: 84 mg/dL (ref 70–99)
Glucose-Capillary: 237 mg/dL — ABNORMAL HIGH (ref 70–99)
Glucose-Capillary: 152 mg/dL — ABNORMAL HIGH (ref 70–99)

## 2015-03-02 LAB — BASIC METABOLIC PANEL
Anion gap: 8 (ref 5–15)
BUN: 30 mg/dL — AB (ref 6–23)
CO2: 18 mmol/L — AB (ref 19–32)
Calcium: 8.6 mg/dL (ref 8.4–10.5)
Chloride: 111 mmol/L (ref 96–112)
Creatinine, Ser: 1.97 mg/dL — ABNORMAL HIGH (ref 0.50–1.10)
GFR calc Af Amer: 30 mL/min — ABNORMAL LOW (ref >90)
GFR calc non Af Amer: 26 mL/min — ABNORMAL LOW (ref >90)
GLUCOSE: 189 mg/dL — AB (ref 70–99)
Potassium: 3.9 mmol/L (ref 3.5–5.1)
Sodium: 137 mmol/L (ref 135–145)

## 2015-03-02 LAB — CBC
HEMATOCRIT: 31.5 % — AB (ref 36.0–46.0)
Hemoglobin: 10.1 g/dL — ABNORMAL LOW (ref 12.0–15.0)
MCH: 28.4 pg (ref 26.0–34.0)
MCHC: 32.1 g/dL (ref 30.0–36.0)
MCV: 88.5 fL (ref 78.0–100.0)
Platelets: 312 10*3/uL (ref 150–400)
RBC: 3.56 MIL/uL — ABNORMAL LOW (ref 3.87–5.11)
RDW: 14.1 % (ref 11.5–15.5)
WBC: 6.7 10*3/uL (ref 4.0–10.5)

## 2015-03-02 NOTE — Progress Notes (Signed)
Tara Hopkins is a 64 y.o. female [birth year] [id number]  Subjective: No new complaints. No new problems. Doing better re:GI sx's Slept well. Feeling OK.  Objective: Vital signs in last 24 hours: Temp:  [98 F (36.7 C)-98.7 F (37.1 C)] 98.6 F (37 C) (04/23 1357) Pulse Rate:  [72-75] 72 (04/23 1357) Resp:  [18] 18 (04/23 1357) BP: (119-127)/(59-66) 127/66 mmHg (04/23 1357) SpO2:  [97 %-99 %] 99 % (04/23 1357) Weight change:  Last BM Date: 03/02/15  Intake/Output from previous day: 04/22 0701 - 04/23 0700 In: 2070 [P.O.:120; IV Piggyback:1300] Out: 160 [Urine:160] Last cbgs: CBG (last 3)   Recent Labs  03/02/15 0329 03/02/15 0649 03/02/15 1154  GLUCAP 84 107* 237*     Physical Exam General: No apparent distress   HEENT: not dry Lungs: Normal effort. Lungs clear to auscultation, no crackles or wheezes. Cardiovascular: Regular rate and rhythm, no edema Abdomen: S/NT/ND; BS(+) Musculoskeletal:  unchanged Neurological: No new neurological deficits Wounds: N/A    Skin: clear  Aging changes Mental state: Alert, oriented, cooperative    Lab Results: BMET    Component Value Date/Time   NA 137 03/02/2015 0930   K 3.9 03/02/2015 0930   CL 111 03/02/2015 0930   CO2 18* 03/02/2015 0930   GLUCOSE 189* 03/02/2015 0930   BUN 30* 03/02/2015 0930   CREATININE 1.97* 03/02/2015 0930   CALCIUM 8.6 03/02/2015 0930   GFRNONAA 26* 03/02/2015 0930   GFRAA 30* 03/02/2015 0930   CBC    Component Value Date/Time   WBC 6.7 03/02/2015 0930   RBC 3.56* 03/02/2015 0930   HGB 10.1* 03/02/2015 0930   HCT 31.5* 03/02/2015 0930   PLT 312 03/02/2015 0930   MCV 88.5 03/02/2015 0930   MCH 28.4 03/02/2015 0930   MCHC 32.1 03/02/2015 0930   RDW 14.1 03/02/2015 0930   LYMPHSABS 1.0 03/01/2015 1022   MONOABS 0.5 03/01/2015 1022   EOSABS 0.1 03/01/2015 1022   BASOSABS 0.0 03/01/2015 1022    Studies/Results: Ct Abdomen Pelvis Wo Contrast  03/01/2015   CLINICAL DATA:   Diarrhea and nausea for days.  EXAM: CT ABDOMEN AND PELVIS WITHOUT CONTRAST  TECHNIQUE: Multidetector CT imaging of the abdomen and pelvis was performed following the standard protocol without IV contrast.  COMPARISON:  None.  FINDINGS: Lung bases demonstrate mild heterogeneous opacification over the right base which may be due to infection versus atelectasis. There is mild cardiomegaly.  Abdominal images demonstrate the liver, spleen, gallbladder, pancreas and adrenal glands to be within normal. Kidneys are normal in size without evidence of focal mass or nephrolithiasis. No significant hydronephrosis. Ureters are unremarkable. Appendix not well visualized. Mild calcified plaque over the abdominal aorta. No significant free fluid or focal inflammatory change noted.  Pelvic images demonstrate the bladder, uterus, ovaries and rectosigmoid colon to be within normal. No free fluid or adenopathy. Mild degenerate change of the spine and hips. Moderate degenerate disc disease at the L3-4 level.  IMPRESSION: No acute findings in the abdomen/ pelvis.  Airspace process over the right lower lobe likely pneumonia and less likely atelectasis.  Mild cardiomegaly.  Significant degenerative disc disease at the L3-4 level.   Electronically Signed   By: Marin Olp M.D.   On: 03/01/2015 18:01    Medications: I have reviewed the patient's current medications.  Assessment/Plan:   1. Functional deficits secondary to hypertensive right lenticular nucleus/corona radiate infarct 2. DVT Prophylaxis/Anticoagulation: Pharmaceutical: Lovenox 3. Pain Management: neurontin for peripheral neuropathy.  4. Bipolar disorder/Mood:  Appears stable. Continue Lamictal bid. Klonopin as needed prn anxiety.  5. Neuropsych: This patient is capable of making decisions on her own behalf. 6. Skin/Wound Care: Continue santyl to ulcer on right shin. Routine pressure relief measures.  7. Fluids/Electrolytes/Nutrition: Monitor I/O. Will add  IVF given elevated BUN and Creat, still some elevation of creat, change IVF to noc, monitor BMET 8. DM type 2 with peripheral neuropathy : Monitor BS with ac/hs checks. Continue Trajenta, amaryl bid and lantus with SSI for elevated BS.  -sugars improving---continue with current regimen 9. CKD stage III: Monitor for stability. Labs stable.  10. Dyslipidemia: On Zocor. 11. HTN: Monitor BP every 8 hours. Continue norvasc and lisinopril.  12. S/P Right partial foot amp 13. Diarrhea, another episode of diarrhea, nausea ,? Gastroenteritis, recent colonoscopy unremarkable, may treat symptomatically since C diff neg x 2. Better.   Length of stay, days: [place] , MD 03/02/2015, 3:29 PM

## 2015-03-02 NOTE — Progress Notes (Signed)
[facility] PROGRESS NOTE  Tara Hopkins CZY:[id number] DOB: 1950/12/24 DOA: 02/20/2015 PCP: Donia Ast, FNP  Assessment/Plan: Acute renal failure -Baseline creatinine 1.4. Creatinine 2.45 on 4/22.  -due to dehydration from diarrhea and vomiting and ACE -bmet pending this am -cut down IVF  Active Problems: Fever/ Diarrhea with vomiting -Likely infectious  -improving -continue empiric Cipro and Flagyl  -FU GI pathogen panel . C-diff negative and noro-virus negative. -CT of abdomen pelvis normal -advance diet to bland and supportive care for now  DM type 2 with diabetic peripheral neuropathy -Currently well controlled CBGs -Continue current insulin regimen. Monitor closely for hypoglycemia as patient is developing renal failure and is on lantus. -Most recent Hgb A1c was 8.9 on 4/9  CVA (cerebral infarction) with Left hemiparesis -continue 325 mg ASA, and CIR     HPI/Subjective: Diarrhea improving  Objective: Filed Vitals:   03/02/15 0618  BP: 119/59  Pulse: 75  Temp: 98 F (36.7 C)  Resp:     Intake/Output Summary (Last 24 hours) at 03/02/15 1025 Last data filed at 03/02/15 0815  Gross per 24 hour  Intake   2610 ml  Output    160 ml  Net   2450 ml   There were no vitals filed for this visit.  Exam:   General:  AAOx3, no distress  Cardiovascular: S!S2/RRR  Respiratory: CTAB  Abdomen: soft, NT, BS present  Musculoskeletal: no edema c/c   Data Reviewed: Basic Metabolic Panel:  Recent Labs Lab 02/26/15 0634 02/27/15 0637 02/28/15 0956 03/01/15 1022  NA 139  --  137 137  K 4.6  --  4.1 4.0  CL 105  --  107 105  CO2 22  --  21 18*  GLUCOSE 164*  --  125* 147*  BUN 48*  --  36* 40*  CREATININE 2.00* 1.88* 1.70* 2.45*  CALCIUM 9.8  --  9.4 9.3   Liver Function Tests:  Recent Labs Lab 03/01/15 1223  AST 29  ALT 59*  ALKPHOS 74  BILITOT 0.4  PROT 7.3  ALBUMIN 3.9    Recent Labs Lab 03/01/15 1222   LIPASE 47   No results for input(s): AMMONIA in the last 168 hours. CBC:  Recent Labs Lab 02/26/15 0634 03/01/15 1022 03/02/15 0930  WBC 9.2 6.7 6.7  NEUTROABS 6.6 5.0  --   HGB 11.4* 11.2* 10.1*  HCT 35.4* 34.6* 31.5*  MCV 89.6 87.2 88.5  PLT 365 340 312   Cardiac Enzymes: No results for input(s): CKTOTAL, CKMB, CKMBINDEX, TROPONINI in the last 168 hours. BNP (last 3 results) No results for input(s): BNP in the last 8760 hours.  ProBNP (last 3 results) No results for input(s): PROBNP in the last 8760 hours.  CBG:  Recent Labs Lab 03/01/15 1121 03/01/15 1634 03/01/15 2130 03/02/15 0329 03/02/15 0649  GLUCAP 110* 88 112* 84 107*    Recent Results (from the past 240 hour(s))  Clostridium Difficile by PCR     Status: None   Collection Time: 02/25/15  9:28 PM  Result Value Ref Range Status   C difficile by pcr NEGATIVE NEGATIVE Final  Clostridium Difficile by PCR     Status: None   Collection Time: 02/28/15  9:31 AM  Result Value Ref Range Status   C difficile by pcr NEGATIVE NEGATIVE Final  Culture, blood (routine x 2)     Status: None (Preliminary result)   Collection Time: 03/01/15 10:22 AM  Result Value Ref Range Status   Specimen Description  BLOOD RIGHT ANTECUBITAL  Final   Special Requests BOTTLES DRAWN AEROBIC AND ANAEROBIC 10CC  Final   Culture   Final           BLOOD CULTURE RECEIVED NO GROWTH TO DATE CULTURE WILL BE HELD FOR 5 DAYS BEFORE ISSUING A FINAL NEGATIVE REPORT Performed at [facility]    Report Status PENDING  Incomplete  Culture, blood (routine x 2)     Status: None (Preliminary result)   Collection Time: 03/01/15 10:32 AM  Result Value Ref Range Status   Specimen Description BLOOD LEFT HAND  Final   Special Requests BOTTLES DRAWN AEROBIC AND ANAEROBIC 10CC  Final   Culture   Final           BLOOD CULTURE RECEIVED NO GROWTH TO DATE CULTURE WILL BE HELD FOR 5 DAYS BEFORE ISSUING A FINAL NEGATIVE REPORT Performed at [facility]    Report Status PENDING  Incomplete     Studies: Ct Abdomen Pelvis Wo Contrast  03/01/2015   CLINICAL DATA:  Diarrhea and nausea for days.  EXAM: CT ABDOMEN AND PELVIS WITHOUT CONTRAST  TECHNIQUE: Multidetector CT imaging of the abdomen and pelvis was performed following the standard protocol without IV contrast.  COMPARISON:  None.  FINDINGS: Lung bases demonstrate mild heterogeneous opacification over the right base which may be due to infection versus atelectasis. There is mild cardiomegaly.  Abdominal images demonstrate the liver, spleen, gallbladder, pancreas and adrenal glands to be within normal. Kidneys are normal in size without evidence of focal mass or nephrolithiasis. No significant hydronephrosis. Ureters are unremarkable. Appendix not well visualized. Mild calcified plaque over the abdominal aorta. No significant free fluid or focal inflammatory change noted.  Pelvic images demonstrate the bladder, uterus, ovaries and rectosigmoid colon to be within normal. No free fluid or adenopathy. Mild degenerate change of the spine and hips. Moderate degenerate disc disease at the L3-4 level.  IMPRESSION: No acute findings in the abdomen/ pelvis.  Airspace process over the right lower lobe likely pneumonia and less likely atelectasis.  Mild cardiomegaly.  Significant degenerative disc disease at the L3-4 level.   Electronically Signed   By: Marin Olp M.D.   On: 03/01/2015 18:01    Scheduled Meds: . aspirin EC  325 mg Oral Daily  . ciprofloxacin  400 mg Intravenous Q12H  . collagenase   Topical Daily  . gabapentin  600 mg Oral BID  . heparin subcutaneous  5,000 Units Subcutaneous 3 times per day  . insulin aspart  0-24 Units Subcutaneous TID WC  . lamoTRIgine  50 mg Oral BID  . latanoprost  1 drop Both Eyes QHS  . metronidazole  500 mg Intravenous Q8H  . ondansetron (ZOFRAN) IV  4 mg Intravenous TID AC  . polycarbophil  1,250 mg Oral Daily  . simvastatin  20 mg Oral QHS    Continuous Infusions: . sodium chloride 1,000 mL (03/02/15 0277)   Antibiotics Given (last 72 hours)    Date/Time Action Medication Dose Rate   03/01/15 1229 Given   metroNIDAZOLE (FLAGYL) IVPB 500 mg 500 mg 100 mL/hr   03/01/15 1356 Given   ciprofloxacin (CIPRO) IVPB 400 mg 400 mg 200 mL/hr   03/01/15 2003 Given   metroNIDAZOLE (FLAGYL) IVPB 500 mg 500 mg 100 mL/hr   03/01/15 2226 Given   ciprofloxacin (CIPRO) IVPB 400 mg 400 mg 200 mL/hr   03/02/15 0231 Given   metroNIDAZOLE (FLAGYL) IVPB 500 mg 500 mg 100 mL/hr  Principal Problem:   Acute renal failure Active Problems:   DM type 2 with diabetic peripheral neuropathy   CVA (cerebral infarction)   Left hemiparesis   Diarrhea   Nausea with vomiting   Fever presenting with conditions classified elsewhere    Time spent: 17mn    [facility] Pager [phone] If 7PM-7AM, please contact night-coverage at [email or web address], password [email or web address]/23/2016, 10:25 AM  LOS: 10 days

## 2015-03-02 NOTE — Progress Notes (Signed)
Physical Therapy Session Note  Patient Details  Name: Tara Hopkins MRN: [id number] Date of Birth: September 06, 1951  Today's Date: 03/02/2015 PT Individual Time: 1500-1530 PT Individual Time Calculation (min): 30 min    Skilled Therapeutic Interventions/Progress Updates:  Patient in room sitting in recliner , no complains of pain or discomfort ,agrees to therapy intervention , states"I feel so much better now". Transfer recliner to w/c with CGA and assistance in positioning w/c for safety. W/C propulsion to the gym with B UE, 2 rest breaks due to fatigue. Patient requested to use NuStep 1 x 12 min on level 4 for resistance-exercise performed to increase activity tolerance and muscular strength. Patient instructed in gait 1 x 15 feet with max A with FWW as AD. Patient with very strong push back,and severely decreased coordination. Patient returned to her room ,by self propulsion of w/c. Transferred to recliner and left with all needs within reach.  Therapy Documentation Precautions:  Precautions Precautions: Fall Precaution Comments: Rt mid foot amputation Restrictions Weight Bearing Restrictions: No Vital Signs: Therapy Vitals Temp: 98.6 F (37 C) Temp Source: Oral Pulse Rate: 72 Resp: 18 BP: 127/66 mmHg Patient Position (if appropriate): Sitting Oxygen Therapy SpO2: 99 %  See FIM for current functional status  Therapy/Group: Individual Therapy  [place] 03/02/2015, 3:55 PM

## 2015-03-03 ENCOUNTER — Inpatient Hospital Stay (HOSPITAL_COMMUNITY): Payer: BC Managed Care – PPO

## 2015-03-03 LAB — BASIC METABOLIC PANEL
Anion gap: 7 (ref 5–15)
BUN: 19 mg/dL (ref 6–23)
CALCIUM: 8.6 mg/dL (ref 8.4–10.5)
CO2: 18 mmol/L — ABNORMAL LOW (ref 19–32)
CREATININE: 1.63 mg/dL — AB (ref 0.50–1.10)
Chloride: 115 mmol/L — ABNORMAL HIGH (ref 96–112)
GFR calc Af Amer: 38 mL/min — ABNORMAL LOW (ref >90)
GFR calc non Af Amer: 33 mL/min — ABNORMAL LOW (ref >90)
GLUCOSE: 129 mg/dL — AB (ref 70–99)
Potassium: 3.8 mmol/L (ref 3.5–5.1)
Sodium: 140 mmol/L (ref 135–145)

## 2015-03-03 LAB — URINE CULTURE: Colony Count: >=100000

## 2015-03-03 LAB — GLUCOSE, CAPILLARY
GLUCOSE-CAPILLARY: 108 mg/dL — AB (ref 70–99)
GLUCOSE-CAPILLARY: 126 mg/dL — AB (ref 70–99)
Glucose-Capillary: 162 mg/dL — ABNORMAL HIGH (ref 70–99)
Glucose-Capillary: 177 mg/dL — ABNORMAL HIGH (ref 70–99)

## 2015-03-03 NOTE — Progress Notes (Signed)
Occupational Therapy Session Note  Patient Details  Name: Tara Hopkins MRN: [id number] Date of Birth: 02-08-1951  Today's Date: 03/03/2015 OT Individual Time: 1100-1130 OT Individual Time Calculation (min): 30 min    Short Term Goals: Week 2:  OT Short Term Goal 1 (Week 2): Pt will complete tub/shower transfer with supervision with RW OT Short Term Goal 2 (Week 2): Pt will complete toilet transfer with supervision with RW OT Short Term Goal 3 (Week 2): Pt will complete grooming tasks in standing with use of LUE as gross assist  Skilled Therapeutic Interventions/Progress Updates: Therapeutic activity with emphasis on [place] at LUE, sit>stand, and dynamic standing.   Pt received seated in recliner with her niece visiting prior to lunch.   Pt educated on methods and exercises to improve [facility] at left hand during functional tasks.   Pt demo's return of gross motor control with left shoulder, elbow, wrist, fingers and thumb with 3+/5 strength although with diminished sensation (proprioception) resulting in overshooting target during reaching exercises.   Pt anticipates use of w/c s/p discharge d/t fear of falling and was educated on box-step dance method x 15 minutes with mod assist to facilitate multidirectional weight-shifting and weight-bearing through LLE.   Pt returned to recliner at end of session with call light placed within reach.     Therapy Documentation Precautions:  Precautions Precautions: Fall Precaution Comments: Rt mid foot amputation Restrictions Weight Bearing Restrictions: No  ADL: No/denies pain    See FIM for current functional status  Therapy/Group: Individual Therapy  [place] 03/03/2015, 12:58 PM

## 2015-03-03 NOTE — Progress Notes (Signed)
[facility] PROGRESS NOTE  Tara Hopkins POE:[id number] DOB: [birth year] DOA: 02/20/2015 PCP: Donia Ast, FNP  Assessment/Plan: Acute renal failure -Baseline creatinine 1.4. Creatinine 2.45 on 4/22.  -due to dehydration from diarrhea and vomiting and ACE -improving, stop IVF  Fever/ Diarrhea with vomiting -Likely gastroenteritis -improving, continue empiric Cipro and Flagyl  -FU GI pathogen panel, C-diff negative and noro-virus negative. -CT of abdomen pelvis normal, tolerating diet -supportive care for now  DM type 2 with diabetic peripheral neuropathy -Currently well controlled CBGs -with hypo episodes, lantus held -Most recent Hgb A1c was 8.9 on 4/9  CVA (cerebral infarction) with Left hemiparesis -continue 325 mg ASA, and CIR     HPI/Subjective: Diarrhea improving, 1-2 episodes today  Objective: Filed Vitals:   03/03/15 0456  BP: 113/57  Pulse: 79  Temp: 98.7 F (37.1 C)  Resp: 18    Intake/Output Summary (Last 24 hours) at 03/03/15 1339 Last data filed at 03/03/15 0900  Gross per 24 hour  Intake    400 ml  Output      1 ml  Net    399 ml   There were no vitals filed for this visit.  Exam:   General:  AAOx3, no distress  Cardiovascular: S!S2/RRR  Respiratory: CTAB  Abdomen: soft, NT, BS present  Musculoskeletal: no edema c/c   Neuro: unchanged  Data Reviewed: Basic Metabolic Panel:  Recent Labs Lab 02/26/15 0634 02/27/15 0637 02/28/15 0956 03/01/15 1022 03/02/15 0930 03/03/15 0452  NA 139  --  137 137 137 140  K 4.6  --  4.1 4.0 3.9 3.8  CL 105  --  107 105 111 115*  CO2 22  --  21 18* 18* 18*  GLUCOSE 164*  --  125* 147* 189* 129*  BUN 48*  --  36* 40* 30* 19  CREATININE 2.00* 1.88* 1.70* 2.45* 1.97* 1.63*  CALCIUM 9.8  --  9.4 9.3 8.6 8.6   Liver Function Tests:  Recent Labs Lab 03/01/15 1223  AST 29  ALT 59*  ALKPHOS 74  BILITOT 0.4  PROT 7.3  ALBUMIN 3.9    Recent Labs Lab  03/01/15 1222  LIPASE 47   No results for input(s): AMMONIA in the last 168 hours. CBC:  Recent Labs Lab 02/26/15 0634 03/01/15 1022 03/02/15 0930  WBC 9.2 6.7 6.7  NEUTROABS 6.6 5.0  --   HGB 11.4* 11.2* 10.1*  HCT 35.4* 34.6* 31.5*  MCV 89.6 87.2 88.5  PLT 365 340 312   Cardiac Enzymes: No results for input(s): CKTOTAL, CKMB, CKMBINDEX, TROPONINI in the last 168 hours. BNP (last 3 results) No results for input(s): BNP in the last 8760 hours.  ProBNP (last 3 results) No results for input(s): PROBNP in the last 8760 hours.  CBG:  Recent Labs Lab 03/02/15 1154 03/02/15 1641 03/02/15 2105 03/03/15 0642 03/03/15 1202  GLUCAP 237* 70 152* 108* 126*    Recent Results (from the past 240 hour(s))  Clostridium Difficile by PCR     Status: None   Collection Time: 02/25/15  9:28 PM  Result Value Ref Range Status   C difficile by pcr NEGATIVE NEGATIVE Final  Clostridium Difficile by PCR     Status: None   Collection Time: 02/28/15  9:31 AM  Result Value Ref Range Status   C difficile by pcr NEGATIVE NEGATIVE Final  Culture, blood (routine x 2)     Status: None (Preliminary result)   Collection Time: 03/01/15 10:22 AM  Result Value Ref  Range Status   Specimen Description BLOOD RIGHT ANTECUBITAL  Final   Special Requests BOTTLES DRAWN AEROBIC AND ANAEROBIC 10CC  Final   Culture   Final           BLOOD CULTURE RECEIVED NO GROWTH TO DATE CULTURE WILL BE HELD FOR 5 DAYS BEFORE ISSUING A FINAL NEGATIVE REPORT Performed at [facility]    Report Status PENDING  Incomplete  Culture, blood (routine x 2)     Status: None (Preliminary result)   Collection Time: 03/01/15 10:32 AM  Result Value Ref Range Status   Specimen Description BLOOD LEFT HAND  Final   Special Requests BOTTLES DRAWN AEROBIC AND ANAEROBIC 10CC  Final   Culture   Final           BLOOD CULTURE RECEIVED NO GROWTH TO DATE CULTURE WILL BE HELD FOR 5 DAYS BEFORE ISSUING A FINAL NEGATIVE  REPORT Performed at [facility]    Report Status PENDING  Incomplete  Culture, Urine     Status: None   Collection Time: 03/01/15  5:38 PM  Result Value Ref Range Status   Specimen Description URINE, RANDOM  Final   Special Requests NONE  Final   Colony Count   Final    >=100,000 COLONIES/ML Performed at [facility]    Culture   Final    Multiple bacterial morphotypes present, none predominant. Suggest appropriate recollection if clinically indicated. Performed at [facility]    Report Status 03/03/2015 FINAL  Final     Studies: Ct Abdomen Pelvis Wo Contrast  03/01/2015   CLINICAL DATA:  Diarrhea and nausea for days.  EXAM: CT ABDOMEN AND PELVIS WITHOUT CONTRAST  TECHNIQUE: Multidetector CT imaging of the abdomen and pelvis was performed following the standard protocol without IV contrast.  COMPARISON:  None.  FINDINGS: Lung bases demonstrate mild heterogeneous opacification over the right base which may be due to infection versus atelectasis. There is mild cardiomegaly.  Abdominal images demonstrate the liver, spleen, gallbladder, pancreas and adrenal glands to be within normal. Kidneys are normal in size without evidence of focal mass or nephrolithiasis. No significant hydronephrosis. Ureters are unremarkable. Appendix not well visualized. Mild calcified plaque over the abdominal aorta. No significant free fluid or focal inflammatory change noted.  Pelvic images demonstrate the bladder, uterus, ovaries and rectosigmoid colon to be within normal. No free fluid or adenopathy. Mild degenerate change of the spine and hips. Moderate degenerate disc disease at the L3-4 level.  IMPRESSION: No acute findings in the abdomen/ pelvis.  Airspace process over the right lower lobe likely pneumonia and less likely atelectasis.  Mild cardiomegaly.  Significant degenerative disc disease at the L3-4 level.   Electronically Signed   By: Marin Olp M.D.   On: 03/01/2015 18:01     Scheduled Meds: . aspirin EC  325 mg Oral Daily  . ciprofloxacin  400 mg Intravenous Q12H  . collagenase   Topical Daily  . gabapentin  600 mg Oral BID  . heparin subcutaneous  5,000 Units Subcutaneous 3 times per day  . insulin aspart  0-24 Units Subcutaneous TID WC  . lamoTRIgine  50 mg Oral BID  . latanoprost  1 drop Both Eyes QHS  . metronidazole  500 mg Intravenous Q8H  . ondansetron (ZOFRAN) IV  4 mg Intravenous TID AC  . polycarbophil  1,250 mg Oral Daily  . simvastatin  20 mg Oral QHS   Continuous Infusions:   Antibiotics Given (last 72  hours)    Date/Time Action Medication Dose Rate   03/01/15 1229 Given   metroNIDAZOLE (FLAGYL) IVPB 500 mg 500 mg 100 mL/hr   03/01/15 1356 Given   ciprofloxacin (CIPRO) IVPB 400 mg 400 mg 200 mL/hr   03/01/15 2003 Given   metroNIDAZOLE (FLAGYL) IVPB 500 mg 500 mg 100 mL/hr   03/01/15 2226 Given   ciprofloxacin (CIPRO) IVPB 400 mg 400 mg 200 mL/hr   03/02/15 0231 Given   metroNIDAZOLE (FLAGYL) IVPB 500 mg 500 mg 100 mL/hr   03/02/15 1037 Given   ciprofloxacin (CIPRO) IVPB 400 mg 400 mg 200 mL/hr   03/02/15 1211 Given   metroNIDAZOLE (FLAGYL) IVPB 500 mg 500 mg 100 mL/hr   03/02/15 1738 Given   metroNIDAZOLE (FLAGYL) IVPB 500 mg 500 mg 100 mL/hr   03/02/15 2206 Given   ciprofloxacin (CIPRO) IVPB 400 mg 400 mg 200 mL/hr   03/03/15 0257 Given   metroNIDAZOLE (FLAGYL) IVPB 500 mg 500 mg 100 mL/hr   03/03/15 1238 Given   ciprofloxacin (CIPRO) IVPB 400 mg 400 mg 200 mL/hr      Principal Problem:   Acute renal failure Active Problems:   DM type 2 with diabetic peripheral neuropathy   CVA (cerebral infarction)   Left hemiparesis   Diarrhea   Nausea with vomiting   Fever presenting with conditions classified elsewhere    Time spent: 67mn    [facility] Pager [phone] If 7PM-7AM, please contact night-coverage at [email or web address], password [email or web address]4/24/2016, 1:39 PM  LOS: 11 days

## 2015-03-03 NOTE — Progress Notes (Signed)
Tara Hopkins is a 64 y.o. female 05/17/1951 [id number]  Subjective: No new complaints. Doing better re:GI sx's Slept well. Feeling OK.  Objective: Vital signs in last 24 hours: Temp:  [98.6 F (37 C)-98.7 F (37.1 C)] 98.7 F (37.1 C) (04/24 0456) Pulse Rate:  [72-79] 79 (04/24 0456) Resp:  [18] 18 (04/24 0456) BP: (113-127)/(57-66) 113/57 mmHg (04/24 0456) SpO2:  [97 %-99 %] 97 % (04/24 0456) Weight change:  Last BM Date: 03/02/15  Intake/Output from previous day: 04/23 0701 - 04/24 0700 In: 820 [P.O.:820] Out: 1 [Urine:1] Last cbgs: CBG (last 3)   Recent Labs  03/02/15 1641 03/02/15 2105 03/03/15 0642  GLUCAP 70 152* 108*     Physical Exam General: No apparent distress   HEENT: not dry Lungs: Normal effort. Lungs clear to auscultation, no crackles or wheezes. Cardiovascular: Regular rate and rhythm, no edema Abdomen: S/NT/ND; BS(+) Musculoskeletal:  unchanged Neurological: No new neurological deficits Wounds: N/A    Skin: clear  Aging changes Mental state: Alert, oriented, cooperative    Lab Results: BMET    Component Value Date/Time   NA 140 03/03/2015 0452   K 3.8 03/03/2015 0452   CL 115* 03/03/2015 0452   CO2 18* 03/03/2015 0452   GLUCOSE 129* 03/03/2015 0452   BUN 19 03/03/2015 0452   CREATININE 1.63* 03/03/2015 0452   CALCIUM 8.6 03/03/2015 0452   GFRNONAA 33* 03/03/2015 0452   GFRAA 38* 03/03/2015 0452   CBC    Component Value Date/Time   WBC 6.7 03/02/2015 0930   RBC 3.56* 03/02/2015 0930   HGB 10.1* 03/02/2015 0930   HCT 31.5* 03/02/2015 0930   PLT 312 03/02/2015 0930   MCV 88.5 03/02/2015 0930   MCH 28.4 03/02/2015 0930   MCHC 32.1 03/02/2015 0930   RDW 14.1 03/02/2015 0930   LYMPHSABS 1.0 03/01/2015 1022   MONOABS 0.5 03/01/2015 1022   EOSABS 0.1 03/01/2015 1022   BASOSABS 0.0 03/01/2015 1022    Studies/Results: Ct Abdomen Pelvis Wo Contrast  03/01/2015   CLINICAL DATA:  Diarrhea and nausea for days.  EXAM: CT  ABDOMEN AND PELVIS WITHOUT CONTRAST  TECHNIQUE: Multidetector CT imaging of the abdomen and pelvis was performed following the standard protocol without IV contrast.  COMPARISON:  None.  FINDINGS: Lung bases demonstrate mild heterogeneous opacification over the right base which may be due to infection versus atelectasis. There is mild cardiomegaly.  Abdominal images demonstrate the liver, spleen, gallbladder, pancreas and adrenal glands to be within normal. Kidneys are normal in size without evidence of focal mass or nephrolithiasis. No significant hydronephrosis. Ureters are unremarkable. Appendix not well visualized. Mild calcified plaque over the abdominal aorta. No significant free fluid or focal inflammatory change noted.  Pelvic images demonstrate the bladder, uterus, ovaries and rectosigmoid colon to be within normal. No free fluid or adenopathy. Mild degenerate change of the spine and hips. Moderate degenerate disc disease at the L3-4 level.  IMPRESSION: No acute findings in the abdomen/ pelvis.  Airspace process over the right lower lobe likely pneumonia and less likely atelectasis.  Mild cardiomegaly.  Significant degenerative disc disease at the L3-4 level.   Electronically Signed   By: Marin Olp M.D.   On: 03/01/2015 18:01    Medications: I have reviewed the patient's current medications.  Assessment/Plan:   1. Functional deficits secondary to hypertensive right lenticular nucleus/corona radiate infarct 2. DVT Prophylaxis/Anticoagulation: Pharmaceutical: Lovenox 3. Pain Management: neurontin for peripheral neuropathy.  4. Bipolar disorder/Mood: Appears stable. Continue Lamictal bid.  Klonopin as needed prn anxiety.  5. Neuropsych: This patient is capable of making decisions on her own behalf. 6. Skin/Wound Care: Continue santyl to ulcer on right shin. Routine pressure relief measures.  7. Fluids/Electrolytes/Nutrition: Monitor I/O. Will add IVF given elevated BUN and Creat, still  some elevation of creat, change IVF to noc, monitor BMET 8. DM type 2 with peripheral neuropathy : Monitor BS with ac/hs checks. Continue Trajenta, amaryl bid and lantus with SSI for elevated BS.  -sugars improving---continue with current regimen 9. CKD stage III: Monitor for stability. Labs stable.  10. Dyslipidemia: On Zocor. 11. HTN: Monitor BP every 8 hours. Continue norvasc and lisinopril.  12. S/P Right partial foot amp 13. Diarrhea, another episode of diarrhea, nausea ,? Gastroenteritis, recent colonoscopy unremarkable, may treat symptomatically since C diff neg x 2. Better.  Cont current Rx   Length of stay, days: [place] , MD 03/03/2015, 8:54 AM

## 2015-03-04 ENCOUNTER — Inpatient Hospital Stay (HOSPITAL_COMMUNITY): Payer: BC Managed Care – PPO

## 2015-03-04 ENCOUNTER — Inpatient Hospital Stay (HOSPITAL_COMMUNITY): Payer: Self-pay | Admitting: Occupational Therapy

## 2015-03-04 ENCOUNTER — Inpatient Hospital Stay (HOSPITAL_COMMUNITY): Payer: Self-pay | Admitting: Speech Pathology

## 2015-03-04 ENCOUNTER — Encounter (HOSPITAL_COMMUNITY): Payer: Self-pay

## 2015-03-04 DIAGNOSIS — I638 Other cerebral infarction: Secondary | ICD-10-CM

## 2015-03-04 DIAGNOSIS — R5081 Fever presenting with conditions classified elsewhere: Secondary | ICD-10-CM

## 2015-03-04 LAB — BASIC METABOLIC PANEL
Anion gap: 11 (ref 5–15)
BUN: 15 mg/dL (ref 6–23)
CHLORIDE: 110 mmol/L (ref 96–112)
CO2: 17 mmol/L — ABNORMAL LOW (ref 19–32)
CREATININE: 1.36 mg/dL — AB (ref 0.50–1.10)
Calcium: 9.4 mg/dL (ref 8.4–10.5)
GFR calc Af Amer: 47 mL/min — ABNORMAL LOW (ref >90)
GFR calc non Af Amer: 41 mL/min — ABNORMAL LOW (ref >90)
GLUCOSE: 165 mg/dL — AB (ref 70–99)
POTASSIUM: 3.7 mmol/L (ref 3.5–5.1)
SODIUM: 138 mmol/L (ref 135–145)

## 2015-03-04 LAB — GLUCOSE, CAPILLARY
GLUCOSE-CAPILLARY: 128 mg/dL — AB (ref 70–99)
Glucose-Capillary: 164 mg/dL — ABNORMAL HIGH (ref 70–99)
Glucose-Capillary: 160 mg/dL — ABNORMAL HIGH (ref 70–99)
Glucose-Capillary: 176 mg/dL — ABNORMAL HIGH (ref 70–99)
Glucose-Capillary: 118 mg/dL — ABNORMAL HIGH (ref 70–99)

## 2015-03-04 MED ORDER — CIPROFLOXACIN HCL 250 MG PO TABS
250.0000 mg | ORAL_TABLET | Freq: Two times a day (BID) | ORAL | Status: DC
  Administered 2015-03-04 – 2015-03-07 (×7): 250 mg via ORAL
  Filled 2015-03-04 (×10): qty 1

## 2015-03-04 MED ORDER — ONDANSETRON HCL 4 MG/2ML IJ SOLN: 4.0000 mg | Freq: Four times a day (QID) | INTRAMUSCULAR | Status: DC | PRN

## 2015-03-04 MED ORDER — ONDANSETRON HCL 4 MG PO TABS
4.0000 mg | ORAL_TABLET | Freq: Three times a day (TID) | ORAL | Status: DC
  Administered 2015-03-04 – 2015-03-08 (×11): 4 mg via ORAL
  Filled 2015-03-04 (×14): qty 1

## 2015-03-04 MED ORDER — METRONIDAZOLE 250 MG PO TABS
250.0000 mg | ORAL_TABLET | Freq: Three times a day (TID) | ORAL | Status: DC
  Administered 2015-03-04 – 2015-03-08 (×12): 250 mg via ORAL
  Filled 2015-03-04 (×15): qty 1

## 2015-03-04 NOTE — Progress Notes (Signed)
Occupational Therapy Session Note  Patient Details  Name: Tara Hopkins MRN: [id number] Date of Birth: Dec 12, 1950  Today's Date: 03/04/2015 OT Individual Time: 1015-1100 OT Individual Time Calculation (min): 45 min    Short Term Goals: Week 2:  OT Short Term Goal 1 (Week 2): Pt will complete tub/shower transfer with supervision with RW OT Short Term Goal 2 (Week 2): Pt will complete toilet transfer with supervision with RW OT Short Term Goal 3 (Week 2): Pt will complete grooming tasks in standing with use of LUE as gross assist  Skilled Therapeutic Interventions/Progress Updates:   Skilled OT session focusing on ADL retraining and functional mobility. Therapist facilitated bathing in walk in shower with tub bench ad dressing at overall supervision level. Pt ambulated to shower room HHA. Noted use of furniture to stabilize. Plan to advise against method during family education session. Pt completed grooming at sink and all dressing seated secondary to wearing a pul over dress. Pt demonstrated moderate emotional lability expressing concern about "losing control of decisions and not being the boss." Therapist reiterated the nature of the family education process and its focus on explaining care as a partnership. Provided education about the role of the Neuropsychologist in recovery as pt showed some reluctance to participate in session already scheduled.  Therapy Documentation Precautions:  Precautions Precautions: Fall Precaution Comments: Rt mid foot amputation Restrictions Weight Bearing Restrictions: No General:   Vital Signs:  Pain: No c/o pain   See FIM for current functional status  Therapy/Group: Individual Therapy  [name] 03/04/2015, 12:56 PM

## 2015-03-04 NOTE — Progress Notes (Signed)
Speech Language Pathology Discharge Summary  Patient Details  Name: Tara Hopkins MRN: [id number] Date of Birth: 29-Apr-1951  Today's Date: 03/04/2015 SLP Individual Time: 1430-1503 SLP Individual Time Calculation (min): 33 min   Skilled Therapeutic Interventions:  Pt was seen for skilled ST targeting family education.  Upon arrival, pt's younger sister and goddaughter were present.  Pt was seated upright in recliner, awake, alert, tearful, but agreeable to participate in therapy.  Per report, pt has been intermittently labile today with family.  Pt conveyed to SLP that she walked today with therapy and insisted that she show the therapist by walking to bathroom.  Pt impulsively stood up and unsteadily walked to the bathroom with max assist from therapist for safety.  Pt became verbally agitated and upset with therapist's attempts to more safely transport pt to bathroom which caused pt to become more unsafe on her feet.  Once returned to the recliner, SLP reviewed and reinforced recommendations for safe transfers listed on the safety plan (mod assist squat pivot). SLP also educated pt and pt's family that her impulsivity is one of the reasons that therapies are strongly recommending 24/7 supervision.  Per family, pt was impulsive and sometimes unsafe prior to admission and she is back to her baseline.  SLP also strongly recommended that pt have assistance for medication and financial management at discharge.  Per family and pt's reports, pt was having her goddaughter check behind her for accuracy when paying her bills as she sometimes overpaid.  Given that pt has met treatment goals and that she is at baseline per her and her family's report, no further ST needs are indicated. Pt and family education is complete at this time.      Patient has met 3 of 3 long term goals.  Patient to discharge at overall Modified Independent;Supervision level.  Reasons goals not met: n/a   Clinical  Impression/Discharge Summary:  Pt made functional gains while inpatient and is discharging from [facility] having met 3 out of 3 long term goals.  Pt is currently Mod I-supervision for basic to semi-complex cognitive tasks due to baseline memory deficits and impulsivity.  Pt's family reports pt to be returned to baseline.  See above daily note.  Pt and family education is complete at this time.  No further ST needs are indicated,  although SLP strongly recommends that pt have 24/7 supervision and assistance for medication and financial management at discharge.    Care Partner:  Caregiver Able to Provide Assistance: Yes  Type of Caregiver Assistance: Physical;Cognitive  Recommendation:  None      Equipment: none recommended by SLP    Reasons for discharge: Treatment goals met   Patient/Family Agrees with Progress Made and Goals Achieved: Yes   See FIM for current functional status  Emilio Math 03/04/2015, 4:43 PM

## 2015-03-04 NOTE — Progress Notes (Signed)
Physical Therapy Weekly Progress Note  Patient Details  Name: Tara Hopkins MRN: [id number] Date of Birth: [birth year]  Beginning of progress report period: February 20, 2014 End of progress report period: March 03, 2014  Today's Date: 03/04/2015 PT Individual Time: 1510-1540 PT Individual Time Calculation (min): 30 min   Patient has met 2 of 4 short term goals.    Patient continues to demonstrate the following deficits: coordination, motor control, balance, awareness and therefore will continue to benefit from skilled PT intervention to enhance overall performance with balance, postural control, ability to compensate for deficits, functional use of  left upper extremity and left lower extremity, awareness and coordination.  Patient not progressing toward long term goals.  See goal revision..  Plan of care revisions: floor transfer discontinued; ambulation distances and assistance downgraded.  PT Short Term Goals Week 1:  PT Short Term Goal 1 (Week 1): Pt will perform bed mobility mod I.  PT Short Term Goal 1 - Progress (Week 1): Not met PT Short Term Goal 2 (Week 1): Pt will perform bed<>w/c transfer with min A and 25% cues from PT.  PT Short Term Goal 2 - Progress (Week 1): Met PT Short Term Goal 3 (Week 1): Pt will propel w/c 100' with mod A and 50% cues from PT.  PT Short Term Goal 3 - Progress (Week 1): Met PT Short Term Goal 4 (Week 1): Pt will ambulate 41' with mod A and LRAD with 50% cues from PT PT Short Term Goal 4 - Progress (Week 1): Not met  Skilled Therapeutic Interventions/Progress Updates:   Pt made QD on 03/01/15 due to poor tolerance of tx. Pt missed 2 PT sessions last week due to illness.  Pt seen for neuromuscular re-education in sitting and standing for LLE stance stability and core activitation for trunk righting and postural control, and LLE coordinated movement.  Blocked practice scooting forward/backward and sit>< stand focusing on forward wt shift.  Pt's IV  started leaking at end of session. RN notified.  All needs left within reach and RN and sister in room at end of session.    Therapy Documentation Precautions:  Precautions Precautions: Fall Precaution Comments: Rt mid foot amputation Restrictions Weight Bearing Restrictions: No        See FIM for current functional status  Therapy/Group: Individual Therapy  [employer] 03/04/2015, 4:06 PM

## 2015-03-04 NOTE — Progress Notes (Signed)
[facility] PROGRESS NOTE  Tara Hopkins PPJ:[id number] DOB: 12-02-1950 DOA: 02/20/2015 PCP: Donia Ast, FNP  Assessment/Plan: Acute renal failure -Baseline creatinine 1.4. Creatinine 2.45 on 4/22.  -due to dehydration from diarrhea and vomiting and ACE -improving, back to baseline, stopped IVF  Fever/ Diarrhea with vomiting -Likely gastroenteritis, improving, continue empiric Cipro and Flagyl change to PO for 7 more days -FU GI pathogen panel, C-diff negative and noro-virus negative. -CT of abdomen pelvis normal, tolerating diet -supportive care for now  Possible pneumonia -on CT Abd, no symptoms, suspect atelectasis -monitor  DM type 2 with diabetic peripheral neuropathy -Currently well controlled CBGs -with hypo episodes, lantus held -Most recent Hgb A1c was 8.9 on 4/9  CVA (cerebral infarction) with Left hemiparesis -continue 325 mg ASA, and CIR     HPI/Subjective: Diarrhea improving, none today, no cough, dyspnea  Objective: Filed Vitals:   03/04/15 0653  BP: 144/65  Pulse: 75  Temp: 97.4 F (36.3 C)  Resp: 18    Intake/Output Summary (Last 24 hours) at 03/04/15 1328 Last data filed at 03/04/15 1209  Gross per 24 hour  Intake   1200 ml  Output      0 ml  Net   1200 ml   There were no vitals filed for this visit.  Exam:   General:  AAOx3, no distress  Cardiovascular: S!S2/RRR  Respiratory: CTAB  Abdomen: soft, NT, BS present  Musculoskeletal: no edema c/c   Neuro: unchanged  Data Reviewed: Basic Metabolic Panel:  Recent Labs Lab 02/28/15 0956 03/01/15 1022 03/02/15 0930 03/03/15 0452 03/04/15 0840  NA 137 137 137 140 138  K 4.1 4.0 3.9 3.8 3.7  CL 107 105 111 115* 110  CO2 21 18* 18* 18* 17*  GLUCOSE 125* 147* 189* 129* 165*  BUN 36* 40* 30* 19 15  CREATININE 1.70* 2.45* 1.97* 1.63* 1.36*  CALCIUM 9.4 9.3 8.6 8.6 9.4   Liver Function Tests:  Recent Labs Lab 03/01/15 1223  AST 29  ALT 59*   ALKPHOS 74  BILITOT 0.4  PROT 7.3  ALBUMIN 3.9    Recent Labs Lab 03/01/15 1222  LIPASE 47   No results for input(s): AMMONIA in the last 168 hours. CBC:  Recent Labs Lab 02/26/15 0634 03/01/15 1022 03/02/15 0930  WBC 9.2 6.7 6.7  NEUTROABS 6.6 5.0  --   HGB 11.4* 11.2* 10.1*  HCT 35.4* 34.6* 31.5*  MCV 89.6 87.2 88.5  PLT 365 340 312   Cardiac Enzymes: No results for input(s): CKTOTAL, CKMB, CKMBINDEX, TROPONINI in the last 168 hours. BNP (last 3 results) No results for input(s): BNP in the last 8760 hours.  ProBNP (last 3 results) No results for input(s): PROBNP in the last 8760 hours.  CBG:  Recent Labs Lab 03/03/15 1621 03/03/15 2113 03/04/15 0117 03/04/15 0703 03/04/15 1118  GLUCAP 162* 177* 164* 160* 176*    Recent Results (from the past 240 hour(s))  Clostridium Difficile by PCR     Status: None   Collection Time: 02/25/15  9:28 PM  Result Value Ref Range Status   C difficile by pcr NEGATIVE NEGATIVE Final  Clostridium Difficile by PCR     Status: None   Collection Time: 02/28/15  9:31 AM  Result Value Ref Range Status   C difficile by pcr NEGATIVE NEGATIVE Final  Culture, blood (routine x 2)     Status: None (Preliminary result)   Collection Time: 03/01/15 10:22 AM  Result Value Ref Range Status  Specimen Description BLOOD RIGHT ANTECUBITAL  Final   Special Requests BOTTLES DRAWN AEROBIC AND ANAEROBIC 10CC  Final   Culture   Final           BLOOD CULTURE RECEIVED NO GROWTH TO DATE CULTURE WILL BE HELD FOR 5 DAYS BEFORE ISSUING A FINAL NEGATIVE REPORT Performed at [facility]    Report Status PENDING  Incomplete  Culture, blood (routine x 2)     Status: None (Preliminary result)   Collection Time: 03/01/15 10:32 AM  Result Value Ref Range Status   Specimen Description BLOOD LEFT HAND  Final   Special Requests BOTTLES DRAWN AEROBIC AND ANAEROBIC 10CC  Final   Culture   Final           BLOOD CULTURE RECEIVED NO GROWTH TO DATE  CULTURE WILL BE HELD FOR 5 DAYS BEFORE ISSUING A FINAL NEGATIVE REPORT Performed at [facility]    Report Status PENDING  Incomplete  Culture, Urine     Status: None   Collection Time: 03/01/15  5:38 PM  Result Value Ref Range Status   Specimen Description URINE, RANDOM  Final   Special Requests NONE  Final   Colony Count   Final    >=100,000 COLONIES/ML Performed at [facility]    Culture   Final    Multiple bacterial morphotypes present, none predominant. Suggest appropriate recollection if clinically indicated. Performed at [facility]    Report Status 03/03/2015 FINAL  Final     Studies: No results found.  Scheduled Meds: . aspirin EC  325 mg Oral Daily  . ciprofloxacin  400 mg Intravenous Q12H  . collagenase   Topical Daily  . gabapentin  600 mg Oral BID  . heparin subcutaneous  5,000 Units Subcutaneous 3 times per day  . insulin aspart  0-24 Units Subcutaneous TID WC  . lamoTRIgine  50 mg Oral BID  . latanoprost  1 drop Both Eyes QHS  . metronidazole  500 mg Intravenous Q8H  . ondansetron (ZOFRAN) IV  4 mg Intravenous TID AC  . polycarbophil  1,250 mg Oral Daily  . simvastatin  20 mg Oral QHS   Continuous Infusions:   Antibiotics Given (last 72 hours)    Date/Time Action Medication Dose Rate   03/01/15 1356 Given   ciprofloxacin (CIPRO) IVPB 400 mg 400 mg 200 mL/hr   03/01/15 2003 Given   metroNIDAZOLE (FLAGYL) IVPB 500 mg 500 mg 100 mL/hr   03/01/15 2226 Given   ciprofloxacin (CIPRO) IVPB 400 mg 400 mg 200 mL/hr   03/02/15 0231 Given   metroNIDAZOLE (FLAGYL) IVPB 500 mg 500 mg 100 mL/hr   03/02/15 1037 Given   ciprofloxacin (CIPRO) IVPB 400 mg 400 mg 200 mL/hr   03/02/15 1211 Given   metroNIDAZOLE (FLAGYL) IVPB 500 mg 500 mg 100 mL/hr   03/02/15 1738 Given   metroNIDAZOLE (FLAGYL) IVPB 500 mg 500 mg 100 mL/hr   03/02/15 2206 Given   ciprofloxacin (CIPRO) IVPB 400 mg 400 mg 200 mL/hr   03/03/15 0257 Given   metroNIDAZOLE  (FLAGYL) IVPB 500 mg 500 mg 100 mL/hr   03/03/15 1530 Given  [loss of iv access]   ciprofloxacin (CIPRO) IVPB 400 mg 400 mg 200 mL/hr   03/03/15 1716 Given   metroNIDAZOLE (FLAGYL) IVPB 500 mg 500 mg 100 mL/hr   03/03/15 2258 Given   ciprofloxacin (CIPRO) IVPB 400 mg 400 mg 200 mL/hr   03/04/15 0117 Given   metroNIDAZOLE (FLAGYL) IVPB  500 mg 500 mg 100 mL/hr      Principal Problem:   Acute renal failure Active Problems:   DM type 2 with diabetic peripheral neuropathy   CVA (cerebral infarction)   Left hemiparesis   Diarrhea   Nausea with vomiting   Fever presenting with conditions classified elsewhere    Time spent: 21mn    [facility] Pager [phone] If 7PM-7AM, please contact night-coverage at [email or web address], password [email or web address]/25/2016, 1:28 PM  LOS: 12 days

## 2015-03-04 NOTE — Progress Notes (Addendum)
64 y.o. RH-female with history of HTN, DM, peripheral neuropathy, anxiety disorder; who was admitted via EMS on 02/15/15 with acute onset of left sided weakness and slurred speech. Upon EMS evaluation BP 220/130 and CBG of 224. She was given labetalol 42m IV x 1 with improvement of BP to 167/71. UDS positive for cocaine. CT head negative and tPA administered. MRI/MRA brain was done revealing acute nonhemorrhagic infarcts in posterior aspect of right lenticular nucleus to posterior corona radiata and posterior limb of L-IC.    Subjective/Complaints: occ loose stools yest, no vomiting, no coughing or SOB Pt feels much better CT abd ok ROS - weakness Left side but moving LLE better Objective: Vital Signs: Blood pressure 144/65, pulse 75, temperature 97.4 F (36.3 C), temperature source Oral, resp. rate 18, SpO2 100 %. No results found. Results for orders placed or performed during the hospital encounter of 02/20/15 (from the past 72 hour(s))  Culture, blood (routine x 2)     Status: None (Preliminary result)   Collection Time: 03/01/15 10:22 AM  Result Value Ref Range   Specimen Description BLOOD RIGHT ANTECUBITAL    Special Requests BOTTLES DRAWN AEROBIC AND ANAEROBIC 10CC    Culture             BLOOD CULTURE RECEIVED NO GROWTH TO DATE CULTURE WILL BE HELD FOR 5 DAYS BEFORE ISSUING A FINAL NEGATIVE REPORT Performed at [facility]   Report Status PENDING   CBC with Differential/Platelet     Status: Abnormal   Collection Time: 03/01/15 10:22 AM  Result Value Ref Range   WBC 6.7 4.0 - 10.5 K/uL   RBC 3.97 3.87 - 5.11 MIL/uL   Hemoglobin 11.2 (L) 12.0 - 15.0 g/dL   HCT 34.6 (L) 36.0 - 46.0 %   MCV 87.2 78.0 - 100.0 fL   MCH 28.2 26.0 - 34.0 pg   MCHC 32.4 30.0 - 36.0 g/dL   RDW 13.8 11.5 - 15.5 %   Platelets 340 150 - 400 K/uL   Neutrophils Relative % 75 43 - 77 %   Neutro Abs 5.0 1.7 - 7.7 K/uL   Lymphocytes Relative 16 12 - 46 %   Lymphs Abs 1.0 0.7 - 4.0 K/uL   Monocytes Relative 7 3 - 12 %   Monocytes Absolute 0.5 0.1 - 1.0 K/uL   Eosinophils Relative 2 0 - 5 %   Eosinophils Absolute 0.1 0.0 - 0.7 K/uL   Basophils Relative 0 0 - 1 %   Basophils Absolute 0.0 0.0 - 0.1 K/uL  Basic metabolic panel     Status: Abnormal   Collection Time: 03/01/15 10:22 AM  Result Value Ref Range   Sodium 137 135 - 145 mmol/L   Potassium 4.0 3.5 - 5.1 mmol/L   Chloride 105 96 - 112 mmol/L   CO2 18 (L) 19 - 32 mmol/L   Glucose, Bld 147 (H) 70 - 99 mg/dL   BUN 40 (H) 6 - 23 mg/dL   Creatinine, Ser 2.45 (H) 0.50 - 1.10 mg/dL   Calcium 9.3 8.4 - 10.5 mg/dL   GFR calc non Af Amer 20 (L) >90 mL/min   GFR calc Af Amer 23 (L) >90 mL/min    Comment: (NOTE) The eGFR has been calculated using the CKD EPI equation. This calculation has not been validated in all clinical situations. eGFR's persistently <90 mL/min signify possible Chronic Kidney Disease.    Anion gap 14 5 - 15  Culture, blood (routine x 2)  Status: None (Preliminary result)   Collection Time: 03/01/15 10:32 AM  Result Value Ref Range   Specimen Description BLOOD LEFT HAND    Special Requests BOTTLES DRAWN AEROBIC AND ANAEROBIC 10CC    Culture             BLOOD CULTURE RECEIVED NO GROWTH TO DATE CULTURE WILL BE HELD FOR 5 DAYS BEFORE ISSUING A FINAL NEGATIVE REPORT Performed at [facility]    Report Status PENDING   Glucose, capillary     Status: Abnormal   Collection Time: 03/01/15 11:21 AM  Result Value Ref Range   Glucose-Capillary 110 (H) 70 - 99 mg/dL   Comment 1 Notify RN   Lipase, blood     Status: None   Collection Time: 03/01/15 12:22 PM  Result Value Ref Range   Lipase 47 11 - 59 U/L  Hepatic function panel     Status: Abnormal   Collection Time: 03/01/15 12:23 PM  Result Value Ref Range   Total Protein 7.3 6.0 - 8.3 g/dL   Albumin 3.9 3.5 - 5.2 g/dL   AST 29 0 - 37 U/L   ALT 59 (H) 0 - 35 U/L   Alkaline Phosphatase 74 39 - 117 U/L   Total Bilirubin 0.4 0.3 - 1.2  mg/dL   Bilirubin, Direct <0.1 0.0 - 0.5 mg/dL   Indirect Bilirubin NOT CALCULATED 0.3 - 0.9 mg/dL  Glucose, capillary     Status: None   Collection Time: 03/01/15  4:34 PM  Result Value Ref Range   Glucose-Capillary 88 70 - 99 mg/dL   Comment 1 Notify RN   Urinalysis, Routine w reflex microscopic     Status: Abnormal   Collection Time: 03/01/15  5:21 PM  Result Value Ref Range   Color, Urine YELLOW YELLOW   APPearance CLOUDY (A) CLEAR   Specific Gravity, Urine 1.008 1.005 - 1.030   pH 5.0 5.0 - 8.0   Glucose, UA NEGATIVE NEGATIVE mg/dL   Hgb urine dipstick NEGATIVE NEGATIVE   Bilirubin Urine NEGATIVE NEGATIVE   Ketones, ur NEGATIVE NEGATIVE mg/dL   Protein, ur 30 (A) NEGATIVE mg/dL   Urobilinogen, UA 0.2 0.0 - 1.0 mg/dL   Nitrite NEGATIVE NEGATIVE   Leukocytes, UA TRACE (A) NEGATIVE  Urine microscopic-add on     Status: Abnormal   Collection Time: 03/01/15  5:21 PM  Result Value Ref Range   Squamous Epithelial / LPF RARE RARE   WBC, UA 0-2 <3 WBC/hpf   RBC / HPF 0-2 <3 RBC/hpf   Bacteria, UA FEW (A) RARE   Casts HYALINE CASTS (A) NEGATIVE  Culture, Urine     Status: None   Collection Time: 03/01/15  5:38 PM  Result Value Ref Range   Specimen Description URINE, RANDOM    Special Requests NONE    Colony Count      >=100,000 COLONIES/ML Performed at [facility]    Culture      Multiple bacterial morphotypes present, none predominant. Suggest appropriate recollection if clinically indicated. Performed at [facility]    Report Status 03/03/2015 FINAL   Glucose, capillary     Status: Abnormal   Collection Time: 03/01/15  9:30 PM  Result Value Ref Range   Glucose-Capillary 112 (H) 70 - 99 mg/dL   Comment 1 Notify RN   Glucose, capillary     Status: None   Collection Time: 03/02/15  3:29 AM  Result Value Ref Range   Glucose-Capillary 84 70 - 99  mg/dL  Glucose, capillary     Status: Abnormal   Collection Time: 03/02/15  6:49 AM  Result Value Ref Range    Glucose-Capillary 107 (H) 70 - 99 mg/dL   Comment 1 Notify RN   Basic metabolic panel     Status: Abnormal   Collection Time: 03/02/15  9:30 AM  Result Value Ref Range   Sodium 137 135 - 145 mmol/L   Potassium 3.9 3.5 - 5.1 mmol/L   Chloride 111 96 - 112 mmol/L   CO2 18 (L) 19 - 32 mmol/L   Glucose, Bld 189 (H) 70 - 99 mg/dL   BUN 30 (H) 6 - 23 mg/dL   Creatinine, Ser 1.97 (H) 0.50 - 1.10 mg/dL   Calcium 8.6 8.4 - 10.5 mg/dL   GFR calc non Af Amer 26 (L) >90 mL/min   GFR calc Af Amer 30 (L) >90 mL/min    Comment: (NOTE) The eGFR has been calculated using the CKD EPI equation. This calculation has not been validated in all clinical situations. eGFR's persistently <90 mL/min signify possible Chronic Kidney Disease.    Anion gap 8 5 - 15  CBC     Status: Abnormal   Collection Time: 03/02/15  9:30 AM  Result Value Ref Range   WBC 6.7 4.0 - 10.5 K/uL   RBC 3.56 (L) 3.87 - 5.11 MIL/uL   Hemoglobin 10.1 (L) 12.0 - 15.0 g/dL   HCT 31.5 (L) 36.0 - 46.0 %   MCV 88.5 78.0 - 100.0 fL   MCH 28.4 26.0 - 34.0 pg   MCHC 32.1 30.0 - 36.0 g/dL   RDW 14.1 11.5 - 15.5 %   Platelets 312 150 - 400 K/uL  Glucose, capillary     Status: Abnormal   Collection Time: 03/02/15 11:54 AM  Result Value Ref Range   Glucose-Capillary 237 (H) 70 - 99 mg/dL   Comment 1 Notify RN   Glucose, capillary     Status: None   Collection Time: 03/02/15  4:41 PM  Result Value Ref Range   Glucose-Capillary 70 70 - 99 mg/dL   Comment 1 Notify RN   Glucose, capillary     Status: Abnormal   Collection Time: 03/02/15  9:05 PM  Result Value Ref Range   Glucose-Capillary 152 (H) 70 - 99 mg/dL  Basic metabolic panel     Status: Abnormal   Collection Time: 03/03/15  4:52 AM  Result Value Ref Range   Sodium 140 135 - 145 mmol/L   Potassium 3.8 3.5 - 5.1 mmol/L   Chloride 115 (H) 96 - 112 mmol/L   CO2 18 (L) 19 - 32 mmol/L   Glucose, Bld 129 (H) 70 - 99 mg/dL   BUN 19 6 - 23 mg/dL    Comment: DELTA CHECK NOTED    Creatinine, Ser 1.63 (H) 0.50 - 1.10 mg/dL   Calcium 8.6 8.4 - 10.5 mg/dL   GFR calc non Af Amer 33 (L) >90 mL/min   GFR calc Af Amer 38 (L) >90 mL/min    Comment: (NOTE) The eGFR has been calculated using the CKD EPI equation. This calculation has not been validated in all clinical situations. eGFR's persistently <90 mL/min signify possible Chronic Kidney Disease.    Anion gap 7 5 - 15  Glucose, capillary     Status: Abnormal   Collection Time: 03/03/15  6:42 AM  Result Value Ref Range   Glucose-Capillary 108 (H) 70 - 99 mg/dL  Glucose, capillary  Status: Abnormal   Collection Time: 03/03/15 12:02 PM  Result Value Ref Range   Glucose-Capillary 126 (H) 70 - 99 mg/dL   Comment 1 Notify RN   Glucose, capillary     Status: Abnormal   Collection Time: 03/03/15  4:21 PM  Result Value Ref Range   Glucose-Capillary 162 (H) 70 - 99 mg/dL   Comment 1 Notify RN   Glucose, capillary     Status: Abnormal   Collection Time: 03/03/15  9:13 PM  Result Value Ref Range   Glucose-Capillary 177 (H) 70 - 99 mg/dL  Glucose, capillary     Status: Abnormal   Collection Time: 03/04/15  1:17 AM  Result Value Ref Range   Glucose-Capillary 164 (H) 70 - 99 mg/dL   Comment 1 Notify RN   Glucose, capillary     Status: Abnormal   Collection Time: 03/04/15  7:03 AM  Result Value Ref Range   Glucose-Capillary 160 (H) 70 - 99 mg/dL   Comment 1 Notify RN      HEENT: poor dentition Cardio: RRR and no murmur Resp: CTA B/L and unlabored GI: BS positive and mild left upper quad tenderness, mild distention Extremity:  Pulses positive and No Edema Skin:   Wound Right post leg 1.5cm granulating area no odor Neuro: Alert/Oriented, Cranial Nerve II-XII normal, Normal Sensory, Abnormal Motor 4-/5 R delt bi tri grip,4 L HF , KE ankle DF, R 5/5 except Right ankle limited by pertial foot amp and Abnormal FMC Ataxic/ dec FMC Musc/Skel:  Other Right trans met amp Gen NAD   Assessment/Plan: 1. Functional  deficits secondary to right lenticular nucleus/corona radiate thrombotic infarct  which require 3+ hours per day of interdisciplinary therapy in a comprehensive inpatient rehab setting. Physiatrist is providing close team supervision and 24 hour management of active medical problems listed below. Physiatrist and rehab team continue to assess barriers to discharge/monitor patient progress toward functional and medical goals.  FIM: FIM - Bathing Bathing Steps Patient Completed: Chest, Right Arm, Left Arm, Abdomen, Front perineal area, Buttocks, Right upper leg, Left upper leg, Right lower leg (including foot), Left lower leg (including foot) Bathing: 4: Steadying assist  FIM - Upper Body Dressing/Undressing Upper body dressing/undressing steps patient completed: Thread/unthread right sleeve of pullover shirt/dresss, Thread/unthread left sleeve of pullover shirt/dress, Put head through opening of pull over shirt/dress, Pull shirt over trunk Upper body dressing/undressing: 5: Supervision: Safety issues/verbal cues FIM - Lower Body Dressing/Undressing Lower body dressing/undressing steps patient completed: Don/Doff right sock, Don/Doff left sock, Don/Doff right shoe, Don/Doff left shoe, Fasten/unfasten right shoe, Fasten/unfasten left shoe, Thread/unthread right pants leg, Thread/unthread left pants leg, Pull pants up/down Lower body dressing/undressing: 4: Min-Patient completed 75 plus % of tasks  FIM - Toileting Toileting steps completed by patient: Performs perineal hygiene, Adjust clothing prior to toileting, Adjust clothing after toileting Toileting Assistive Devices: Grab bar or rail for support Toileting: 4: Steadying assist  FIM - Radio producer Devices: Grab bars Toilet Transfers: 3-To toilet/BSC: Mod A (lift or lower assist), 4-From toilet/BSC: Min A (steadying Pt. > 75%)  FIM - Bed/Chair Transfer Bed/Chair Transfer Assistive Devices: Arm rests Bed/Chair  Transfer: 4: Bed > Chair or W/C: Min A (steadying Pt. > 75%), 4: Chair or W/C > Bed: Min A (steadying Pt. > 75%)  FIM - Locomotion: Wheelchair Distance: 40 Locomotion: Wheelchair: 2: Travels 50 - 149 ft with moderate assistance (Pt: 50 - 74%) FIM - Locomotion: Ambulation Locomotion: Ambulation Assistive Devices: Lite Gait  Ambulation/Gait Assistance: 1: +2 Total assist, 3: Mod assist (ModA of 1 for gait, +2 for w/c follow) Locomotion: Ambulation: 1: Two helpers  Comprehension Comprehension Mode: Auditory Comprehension: 5-Follows basic conversation/direction: With no assist  Expression Expression Mode: Verbal Expression: 5-Expresses basic needs/ideas: With no assist  Social Interaction Social Interaction: 5-Interacts appropriately 90% of the time - Needs monitoring or encouragement for participation or interaction.  Problem Solving Problem Solving: 5-Solves complex 90% of the time/cues < 10% of the time  Memory Memory: 5-Recognizes or recalls 90% of the time/requires cueing < 10% of the time  Medical Problem List and Plan: 1. Functional deficits secondary to hypertensive right lenticular nucleus/corona radiate infarct 2. DVT Prophylaxis/Anticoagulation: Pharmaceutical: Lovenox 3. Pain Management: neurontin for peripheral neuropathy.  4. Bipolar disorder/Mood: Appears stable. Continue Lamictal bid. Klonopin as needed prn anxiety.  5. Neuropsych: This patient is capable of making decisions on her own behalf. 6. Skin/Wound Care: Continue santyl to ulcer on right shin. Routine pressure relief measures.  7. Fluids/Electrolytes/Nutrition: Monitor I/O. BUN/Creat improving with IVF , IM to address                                              8. DM type 2 with peripheral neuropathy : Monitor BS with ac/hs checks. Continue Trajenta, amaryl bid and lantus with SSI for elevated BS.   -sugars improving---continue with current regimen 9. Acute on chronic Renal failure : Monitor for  stability. Labs stable.  10. Dyslipidemia: On Zocor. 11. HTN: Monitor BP every 8 hours. Continue norvasc and lisinopril.  12.  S/P  Right partial foot amp 13.  Diarrhea, another episode of diarrhea, nausea , Gastroenteritis, recent colonoscopy unremarkable,abx per IM, CT abd - except poss PNA  LOS (Days) 12 A FACE TO FACE EVALUATION WAS PERFORMED  KIRSTEINS,ANDREW E 03/04/2015, 8:27 AM

## 2015-03-05 ENCOUNTER — Inpatient Hospital Stay (HOSPITAL_COMMUNITY): Payer: BC Managed Care – PPO | Admitting: *Deleted

## 2015-03-05 ENCOUNTER — Encounter (HOSPITAL_COMMUNITY): Payer: Self-pay

## 2015-03-05 ENCOUNTER — Inpatient Hospital Stay (HOSPITAL_COMMUNITY): Payer: Self-pay

## 2015-03-05 DIAGNOSIS — G629 Polyneuropathy, unspecified: Secondary | ICD-10-CM

## 2015-03-05 DIAGNOSIS — E1142 Type 2 diabetes mellitus with diabetic polyneuropathy: Secondary | ICD-10-CM

## 2015-03-05 DIAGNOSIS — F310 Bipolar disorder, current episode hypomanic: Secondary | ICD-10-CM

## 2015-03-05 DIAGNOSIS — G8190 Hemiplegia, unspecified affecting unspecified side: Secondary | ICD-10-CM

## 2015-03-05 DIAGNOSIS — F3130 Bipolar disorder, current episode depressed, mild or moderate severity, unspecified: Secondary | ICD-10-CM

## 2015-03-05 LAB — GLUCOSE, CAPILLARY
Glucose-Capillary: 129 mg/dL — ABNORMAL HIGH (ref 70–99)
Glucose-Capillary: 115 mg/dL — ABNORMAL HIGH (ref 70–99)
Glucose-Capillary: 154 mg/dL — ABNORMAL HIGH (ref 70–99)
Glucose-Capillary: 164 mg/dL — ABNORMAL HIGH (ref 70–99)

## 2015-03-05 LAB — GI PATHOGEN PANEL BY PCR, STOOL
C DIFFICILE TOXIN A/B: NOT DETECTED
CRYPTOSPORIDIUM BY PCR: NOT DETECTED
Campylobacter by PCR: NOT DETECTED
E COLI (ETEC) LT/ST: NOT DETECTED
E COLI (STEC): NOT DETECTED
E COLI 0157 BY PCR: NOT DETECTED
G LAMBLIA BY PCR: NOT DETECTED
NOROVIRUS G1/G2: NOT DETECTED
Rotavirus A by PCR: NOT DETECTED
Salmonella by PCR: NOT DETECTED
Shigella by PCR: NOT DETECTED

## 2015-03-05 NOTE — Progress Notes (Signed)
Physical Therapy Session Note  Patient Details  Name: Tara Hopkins MRN: [id number] Date of Birth: 1951-10-04  Today's Date: 03/05/2015 PT Individual Time: 1300-1400 PT Individual Time Calculation (min): 60 min   Short Term Goals: Week 2:  PT Short Term Goal 1 (Week 2): pt will perform bed mobility with modified independence PT Short Term Goal 2 (Week 2): pt will perform basic transfers with supervision with mod cues for safety PT Short Term Goal 3 (Week 2): pt will perform gait x 25' with mod assistance PT Short Term Goal 4 (Week 2): pt will ascend and descend 5 steps 2 rails with moderate assistance  Skilled Therapeutic Interventions/Progress Updates:    Pt received seated in recliner, agreeable to participate in therapy. Session focused on functional endurance, trunk and L side NMR. Instructed pt in propelling w/c w/ BUE 150' to/from rehab gym w/ extra time and mod cueing for increased use of LUE. Instructed pt in standing balance work at edge of mat with pt finding balance point with one foot on 6" riser w/ min manual facilitation for hip extension, weight shift to R (when L foot was on riser). Instructed pt in seated reaching activity with wedge biasing pt for anterior pelvic tilt w/ pt reaching for weighted object w/ BUE to promote trunk rotation and pelvic disassociation. Instructed pt in complex pipe tree puzzle w/ bin of parts on floor and pt reaching down with LUE to find parts and using LUE for 90% of puzzle construction. Pt ambulated 20' w/ RW and MinA. Session ended in pt's room, where pt was left seated in recliner w/ family present w/ all needs within reach.    Therapy Documentation Precautions:  Precautions Precautions: Fall Precaution Comments: Rt mid foot amputation Restrictions Weight Bearing Restrictions: No Pain:  No/denies pain  See FIM for current functional status  Therapy/Group: Individual Therapy  Rada Hay  Rada Hay, PT, DPT 03/05/2015, 7:50 AM

## 2015-03-05 NOTE — Progress Notes (Signed)
64 y.o. RH-female with history of HTN, DM, peripheral neuropathy, anxiety disorder; who was admitted via EMS on 02/15/15 with acute onset of left sided weakness and slurred speech. Upon EMS evaluation BP 220/130 and CBG of 224. She was given labetalol 71m IV x 1 with improvement of BP to 167/71. UDS positive for cocaine. CT head negative and tPA administered. MRI/MRA brain was done revealing acute nonhemorrhagic infarcts in posterior aspect of right lenticular nucleus to posterior corona radiata and posterior limb of L-IC.    Subjective/Complaints: Appreciate IM note No further vomiting, nausea or diarrhea ROS - weakness Left side, "walking  Better", ROS o/w neg Objective: Vital Signs: Blood pressure 148/71, pulse 64, temperature 98 F (36.7 C), temperature source Oral, resp. rate 18, SpO2 100 %. No results found. Results for orders placed or performed during the hospital encounter of 02/20/15 (from the past 72 hour(s))  Basic metabolic panel     Status: Abnormal   Collection Time: 03/02/15  9:30 AM  Result Value Ref Range   Sodium 137 135 - 145 mmol/L   Potassium 3.9 3.5 - 5.1 mmol/L   Chloride 111 96 - 112 mmol/L   CO2 18 (L) 19 - 32 mmol/L   Glucose, Bld 189 (H) 70 - 99 mg/dL   BUN 30 (H) 6 - 23 mg/dL   Creatinine, Ser 1.97 (H) 0.50 - 1.10 mg/dL   Calcium 8.6 8.4 - 10.5 mg/dL   GFR calc non Af Amer 26 (L) >90 mL/min   GFR calc Af Amer 30 (L) >90 mL/min    Comment: (NOTE) The eGFR has been calculated using the CKD EPI equation. This calculation has not been validated in all clinical situations. eGFR's persistently <90 mL/min signify possible Chronic Kidney Disease.    Anion gap 8 5 - 15  CBC     Status: Abnormal   Collection Time: 03/02/15  9:30 AM  Result Value Ref Range   WBC 6.7 4.0 - 10.5 K/uL   RBC 3.56 (L) 3.87 - 5.11 MIL/uL   Hemoglobin 10.1 (L) 12.0 - 15.0 g/dL   HCT 31.5 (L) 36.0 - 46.0 %   MCV 88.5 78.0 - 100.0 fL   MCH 28.4 26.0 - 34.0 pg   MCHC 32.1 30.0  - 36.0 g/dL   RDW 14.1 11.5 - 15.5 %   Platelets 312 150 - 400 K/uL  Glucose, capillary     Status: Abnormal   Collection Time: 03/02/15 11:54 AM  Result Value Ref Range   Glucose-Capillary 237 (H) 70 - 99 mg/dL   Comment 1 Notify RN   Glucose, capillary     Status: None   Collection Time: 03/02/15  4:41 PM  Result Value Ref Range   Glucose-Capillary 70 70 - 99 mg/dL   Comment 1 Notify RN   Glucose, capillary     Status: Abnormal   Collection Time: 03/02/15  9:05 PM  Result Value Ref Range   Glucose-Capillary 152 (H) 70 - 99 mg/dL  Basic metabolic panel     Status: Abnormal   Collection Time: 03/03/15  4:52 AM  Result Value Ref Range   Sodium 140 135 - 145 mmol/L   Potassium 3.8 3.5 - 5.1 mmol/L   Chloride 115 (H) 96 - 112 mmol/L   CO2 18 (L) 19 - 32 mmol/L   Glucose, Bld 129 (H) 70 - 99 mg/dL   BUN 19 6 - 23 mg/dL    Comment: DELTA CHECK NOTED   Creatinine, Ser 1.63 (H) 0.50 -  1.10 mg/dL   Calcium 8.6 8.4 - 10.5 mg/dL   GFR calc non Af Amer 33 (L) >90 mL/min   GFR calc Af Amer 38 (L) >90 mL/min    Comment: (NOTE) The eGFR has been calculated using the CKD EPI equation. This calculation has not been validated in all clinical situations. eGFR's persistently <90 mL/min signify possible Chronic Kidney Disease.    Anion gap 7 5 - 15  Glucose, capillary     Status: Abnormal   Collection Time: 03/03/15  6:42 AM  Result Value Ref Range   Glucose-Capillary 108 (H) 70 - 99 mg/dL  Glucose, capillary     Status: Abnormal   Collection Time: 03/03/15 12:02 PM  Result Value Ref Range   Glucose-Capillary 126 (H) 70 - 99 mg/dL   Comment 1 Notify RN   Glucose, capillary     Status: Abnormal   Collection Time: 03/03/15  4:21 PM  Result Value Ref Range   Glucose-Capillary 162 (H) 70 - 99 mg/dL   Comment 1 Notify RN   Glucose, capillary     Status: Abnormal   Collection Time: 03/03/15  9:13 PM  Result Value Ref Range   Glucose-Capillary 177 (H) 70 - 99 mg/dL  Glucose, capillary      Status: Abnormal   Collection Time: 03/04/15  1:17 AM  Result Value Ref Range   Glucose-Capillary 164 (H) 70 - 99 mg/dL   Comment 1 Notify RN   Glucose, capillary     Status: Abnormal   Collection Time: 03/04/15  7:03 AM  Result Value Ref Range   Glucose-Capillary 160 (H) 70 - 99 mg/dL   Comment 1 Notify RN   Basic metabolic panel     Status: Abnormal   Collection Time: 03/04/15  8:40 AM  Result Value Ref Range   Sodium 138 135 - 145 mmol/L   Potassium 3.7 3.5 - 5.1 mmol/L   Chloride 110 96 - 112 mmol/L   CO2 17 (L) 19 - 32 mmol/L   Glucose, Bld 165 (H) 70 - 99 mg/dL   BUN 15 6 - 23 mg/dL   Creatinine, Ser 1.36 (H) 0.50 - 1.10 mg/dL   Calcium 9.4 8.4 - 10.5 mg/dL   GFR calc non Af Amer 41 (L) >90 mL/min   GFR calc Af Amer 47 (L) >90 mL/min    Comment: (NOTE) The eGFR has been calculated using the CKD EPI equation. This calculation has not been validated in all clinical situations. eGFR's persistently <90 mL/min signify possible Chronic Kidney Disease.    Anion gap 11 5 - 15  Glucose, capillary     Status: Abnormal   Collection Time: 03/04/15 11:18 AM  Result Value Ref Range   Glucose-Capillary 176 (H) 70 - 99 mg/dL   Comment 1 Notify RN   Glucose, capillary     Status: Abnormal   Collection Time: 03/04/15  4:15 PM  Result Value Ref Range   Glucose-Capillary 118 (H) 70 - 99 mg/dL   Comment 1 Notify RN   Glucose, capillary     Status: Abnormal   Collection Time: 03/04/15 10:05 PM  Result Value Ref Range   Glucose-Capillary 128 (H) 70 - 99 mg/dL   Comment 1 Notify RN   Glucose, capillary     Status: Abnormal   Collection Time: 03/05/15  6:58 AM  Result Value Ref Range   Glucose-Capillary 129 (H) 70 - 99 mg/dL   Comment 1 Notify RN      HEENT: poor  dentition Cardio: RRR and no murmur Resp: CTA B/L and unlabored GI: BS positive and mild left upper quad tenderness, mild distention Extremity:  Pulses positive and No Edema Skin:   Wound Right post leg 1.5cm  granulating area no odor Neuro: Alert/Oriented, Cranial Nerve II-XII normal, Normal Sensory, Abnormal Motor 4-/5 R delt bi tri grip,4 L HF , KE ankle DF, R 5/5 except Right ankle limited by pertial foot amp and Abnormal FMC Ataxic/ dec FMC Musc/Skel:  Other Right trans met amp Gen NAD   Assessment/Plan: 1. Functional deficits secondary to right lenticular nucleus/corona radiate thrombotic infarct  which require 3+ hours per day of interdisciplinary therapy in a comprehensive inpatient rehab setting. Physiatrist is providing close team supervision and 24 hour management of active medical problems listed below. Physiatrist and rehab team continue to assess barriers to discharge/monitor patient progress toward functional and medical goals.  FIM: FIM - Bathing Bathing Steps Patient Completed: Chest, Right Arm, Left Arm, Abdomen, Front perineal area, Buttocks, Right upper leg, Left upper leg, Right lower leg (including foot), Left lower leg (including foot) Bathing: 5: Supervision: Safety issues/verbal cues  FIM - Upper Body Dressing/Undressing Upper body dressing/undressing steps patient completed: Thread/unthread right sleeve of pullover shirt/dresss, Thread/unthread left sleeve of pullover shirt/dress, Put head through opening of pull over shirt/dress, Pull shirt over trunk Upper body dressing/undressing: 5: Supervision: Safety issues/verbal cues FIM - Lower Body Dressing/Undressing Lower body dressing/undressing steps patient completed: Don/Doff right sock, Don/Doff left sock, Don/Doff right shoe, Don/Doff left shoe, Fasten/unfasten right shoe, Fasten/unfasten left shoe Lower body dressing/undressing: 5: Supervision: Safety issues/verbal cues  FIM - Toileting Toileting steps completed by patient: Performs perineal hygiene, Adjust clothing prior to toileting, Adjust clothing after toileting Toileting Assistive Devices: Grab bar or rail for support Toileting: 4: Steadying assist  FIM -  Radio producer Devices: Grab bars Toilet Transfers: 3-To toilet/BSC: Mod A (lift or lower assist), 4-From toilet/BSC: Min A (steadying Pt. > 75%)  FIM - Bed/Chair Transfer Bed/Chair Transfer Assistive Devices: Arm rests Bed/Chair Transfer: 4: Bed > Chair or W/C: Min A (steadying Pt. > 75%), 4: Chair or W/C > Bed: Min A (steadying Pt. > 75%)  FIM - Locomotion: Wheelchair Distance: 40 Locomotion: Wheelchair: 2: Travels 50 - 149 ft with moderate assistance (Pt: 50 - 74%) FIM - Locomotion: Ambulation Locomotion: Ambulation Assistive Devices: Lite Gait Ambulation/Gait Assistance: 1: +2 Total assist, 3: Mod assist (ModA of 1 for gait, +2 for w/c follow) Locomotion: Ambulation: 1: Two helpers  Comprehension Comprehension Mode: Auditory Comprehension: 5-Follows basic conversation/direction: With no assist  Expression Expression Mode: Verbal Expression: 5-Expresses basic needs/ideas: With extra time/assistive device  Social Interaction Social Interaction: 5-Interacts appropriately 90% of the time - Needs monitoring or encouragement for participation or interaction.  Problem Solving Problem Solving: 5-Solves complex 90% of the time/cues < 10% of the time  Memory Memory: 5-Recognizes or recalls 90% of the time/requires cueing < 10% of the time  Medical Problem List and Plan: 1. Functional deficits secondary to hypertensive right lenticular nucleus/corona radiate infarct 2. DVT Prophylaxis/Anticoagulation: Pharmaceutical: Lovenox 3. Pain Management: neurontin for peripheral neuropathy.  4. Bipolar disorder/Mood: Appears stable. Continue Lamictal bid. Klonopin as needed prn anxiety.  5. Neuropsych: This patient is capable of making decisions on her own behalf. 6. Skin/Wound Care: Continue santyl to ulcer on right shin. Routine pressure relief measures.  7. Fluids/Electrolytes/Nutrition: Monitor I/O. BUN/Creat improving with IVF , IM to address  8. DM type 2 with peripheral neuropathy : Monitor BS with ac/hs checks. Continue Trajenta, amaryl bid and lantus with SSI for elevated BS.   -sugars improving---continue with current regimen 9. Acute on chronic Renal failure : Monitor for stability. Labs stable.  10. Dyslipidemia: On Zocor. 11. HTN: Monitor BP every 8 hours. Continue norvasc and lisinopril.  12.  S/P  Right partial foot amp 13.  Diarrhea,likely infectious, resolved , now on po Flagyll and cipro  per IM  LOS (Days) Izard E 03/05/2015, 8:33 AM

## 2015-03-05 NOTE — Progress Notes (Signed)
Occupational Therapy Session Note  Patient Details  Name: Tara Hopkins MRN: [id number] Date of Birth: 02-Aug-1951  Today's Date: 03/05/2015 OT Individual Time: 0900-1000 OT Individual Time Calculation (min): 60 min    Short Term Goals: Week 2:  OT Short Term Goal 1 (Week 2): Pt will complete tub/shower transfer with supervision with RW OT Short Term Goal 2 (Week 2): Pt will complete toilet transfer with supervision with RW OT Short Term Goal 3 (Week 2): Pt will complete grooming tasks in standing with use of LUE as gross assist  Skilled Therapeutic Interventions/Progress Updates:    Pt resting in w/c upon arrival.  Pt stated she already bathed and changed clothing.  Pt transitioned to ADL apartment and practiced tub bench transfers and engaged in bed mobility tasks. Pt required steady A to amb with RW to bathroom and in ADL apartment.  Pt required min verbal cues for safe/correct BUE placement in preparation for sit<>stand.  Pt transitioned to therapy gym and engaged in standing activity while grasping clothes pins with Left hand and placing on metal dowel.  Pt required more than reasonable amount of time to complete tasks.  Therapy Documentation Precautions:  Precautions Precautions: Fall Precaution Comments: Rt mid foot amputation Restrictions Weight Bearing Restrictions: No Pain: Pain Assessment Pain Assessment: No/denies pain  See FIM for current functional status  Therapy/Group: Individual Therapy  Leroy Libman 03/05/2015, 10:06 AM

## 2015-03-05 NOTE — Progress Notes (Signed)
Social Work Patient ID: Tara Hopkins, female   DOB: Nov 20, 1950, 65 y.o.   MRN: [id number] Met with pt who has her HC-POA forms completed, have contacted Spiritual care for them to come and [place] and have the witnesses here.  Have informed them after 2;00 pm pt is finished with her therapies. Will also contact her family to schedule family education, for Thursday in preparation for her discharge Friday.

## 2015-03-05 NOTE — Progress Notes (Signed)
Pt seen, diarrhea appears to have resolved, continue PO CIpro/Flagyl for 29moe days Clinically doubt pneumonia, incentive spirometry, ambulate I will sign off, please call if we can be of any further assistance  PDomenic Polite MD [phone]

## 2015-03-05 NOTE — Progress Notes (Signed)
Recreational Therapy Session Note  Patient Details  Name: Tara Hopkins MRN: [id number] Date of Birth: 13-Aug-1951 Today's Date: 03/05/2015  Pain: NO C/O Skilled Therapeutic Interventions/Progress Updates: Session focused on discharge planning in regards to use of leisure time, potential modifications/adaptions to leisure interests and providing psychosocial support.  Pt stated she had just finished with neuro-psychologist and acknowledged feeling depressed, emotional support provided.  Pt requested to ambulate using RW.  Pt ambulated ~20' with min assist.  Therapy/Group: Individual Therapy   Nakeita Styles 03/05/2015, 3:59 PM

## 2015-03-06 ENCOUNTER — Inpatient Hospital Stay (HOSPITAL_COMMUNITY): Payer: BC Managed Care – PPO | Admitting: Occupational Therapy

## 2015-03-06 ENCOUNTER — Inpatient Hospital Stay (HOSPITAL_COMMUNITY): Payer: BC Managed Care – PPO

## 2015-03-06 ENCOUNTER — Inpatient Hospital Stay (HOSPITAL_COMMUNITY): Payer: Self-pay | Admitting: *Deleted

## 2015-03-06 DIAGNOSIS — I63012 Cerebral infarction due to thrombosis of left vertebral artery: Secondary | ICD-10-CM

## 2015-03-06 LAB — GLUCOSE, CAPILLARY
GLUCOSE-CAPILLARY: 127 mg/dL — AB (ref 70–99)
GLUCOSE-CAPILLARY: 246 mg/dL — AB (ref 70–99)
Glucose-Capillary: 168 mg/dL — ABNORMAL HIGH (ref 70–99)
Glucose-Capillary: 154 mg/dL — ABNORMAL HIGH (ref 70–99)

## 2015-03-06 LAB — CREATININE, SERUM
CREATININE: 1.49 mg/dL — AB (ref 0.50–1.10)
GFR calc Af Amer: 42 mL/min — ABNORMAL LOW (ref >90)
GFR calc non Af Amer: 36 mL/min — ABNORMAL LOW (ref >90)

## 2015-03-06 NOTE — Progress Notes (Signed)
Physical Therapy Session Note  Patient Details  Name: Tara Hopkins MRN: [id number] Date of Birth: 05/29/1951  Today's Date: 03/06/2015 PT Individual Time: 1300-1400 PT Individual Time Calculation (min): 60 min   Short Term Goals: Week 2:  PT Short Term Goal 1 (Week 2): pt will perform bed mobility with modified independence PT Short Term Goal 2 (Week 2): pt will perform basic transfers with supervision with mod cues for safety PT Short Term Goal 3 (Week 2): pt will perform gait x 25' with mod assistance PT Short Term Goal 4 (Week 2): pt will ascend and descend 5 steps 2 rails with moderate assistance     Skilled Therapeutic Interventions/Progress Updates:   neuromuscular re-education for L visual attention, L trunk shortening/lengthening/rotating in supported> unsupported sitting;  L stance stability in standing with = wt bearing, and standing biased to L during L hand fine motor activity; for L swing phase components on 5 steps 2 rails, gait with weighted grocery cart, in standing with bil UE support for LLE step/taps for better clearance during gait .  Gait with grocery cart x 90' with min assist, mod assist for turns and when pt laughed while ambulating.  Up/down 5 steps min/mod assist for L stance stability.  Gait with RW x 75' with min assist except for 1 LOB L requiring mod assist, as pt attempted to clear L foot higher.  Pt returned to room, and transferred w/c> recliner to R, close supervision.  Pt has been getting up by herself in room, apparently, and furniture walking to BR.  PT educated pt again on falls risks; she agreed she would use call bell to call staff when needing to use BR.  PT informed NT of this; they will monitor her for unsafe behavior.    Therapy Documentation Precautions:  Precautions Precautions: Fall Precaution Comments: Rt mid foot amputation Restrictions Weight Bearing Restrictions: No Pain: Pain Assessment Pain Assessment: No/denies pain    See  FIM for current functional status  Therapy/Group: Individual Therapy  Smita Lesh 03/06/2015, 4:02 PM

## 2015-03-06 NOTE — Progress Notes (Signed)
Social Work Patient ID: Tara Hopkins, female   DOB: May 20, 1951, 64 y.o.   MRN: [id number] Met with pt and spoke with Kenney Houseman to inform, team conference progression toward her goals and discharge still 4/29.  She does not want it changed. Family education for tomorrow at 9;00 am with [place].  She may need to show other members the care due to them not being able to get in here. Pt has all equipment and prefers [facility] for follow up therapies.  Will see tomorrow to make sure all education goes well.

## 2015-03-06 NOTE — Progress Notes (Signed)
64 y.o. RH-female with history of HTN, DM, peripheral neuropathy, anxiety disorder; who was admitted via EMS on 02/15/15 with acute onset of left sided weakness and slurred speech. Upon EMS evaluation BP 220/130 and CBG of 224. She was given labetalol 21m IV x 1 with improvement of BP to 167/71. UDS positive for cocaine. CT head negative and tPA administered. MRI/MRA brain was done revealing acute nonhemorrhagic infarcts in posterior aspect of right lenticular nucleus to posterior corona radiata and posterior limb of L-IC.    Subjective/Complaints: IM signed off We discussed CVA modifiable risk factors, +cocaine on UDS, was also smoking 3-4 cigs per day ROS - weakness on Left side, hx R midfoot amp Objective: Vital Signs: Blood pressure 150/80, pulse 66, temperature 97.9 F (36.6 C), temperature source Oral, resp. rate 18, SpO2 100 %. No results found. Results for orders placed or performed during the hospital encounter of 02/20/15 (from the past 72 hour(s))  Glucose, capillary     Status: Abnormal   Collection Time: 03/03/15 12:02 PM  Result Value Ref Range   Glucose-Capillary 126 (H) 70 - 99 mg/dL   Comment 1 Notify RN   Glucose, capillary     Status: Abnormal   Collection Time: 03/03/15  4:21 PM  Result Value Ref Range   Glucose-Capillary 162 (H) 70 - 99 mg/dL   Comment 1 Notify RN   Glucose, capillary     Status: Abnormal   Collection Time: 03/03/15  9:13 PM  Result Value Ref Range   Glucose-Capillary 177 (H) 70 - 99 mg/dL  Glucose, capillary     Status: Abnormal   Collection Time: 03/04/15  1:17 AM  Result Value Ref Range   Glucose-Capillary 164 (H) 70 - 99 mg/dL   Comment 1 Notify RN   Glucose, capillary     Status: Abnormal   Collection Time: 03/04/15  7:03 AM  Result Value Ref Range   Glucose-Capillary 160 (H) 70 - 99 mg/dL   Comment 1 Notify RN   Basic metabolic panel     Status: Abnormal   Collection Time: 03/04/15  8:40 AM  Result Value Ref Range   Sodium 138  135 - 145 mmol/L   Potassium 3.7 3.5 - 5.1 mmol/L   Chloride 110 96 - 112 mmol/L   CO2 17 (L) 19 - 32 mmol/L   Glucose, Bld 165 (H) 70 - 99 mg/dL   BUN 15 6 - 23 mg/dL   Creatinine, Ser 1.36 (H) 0.50 - 1.10 mg/dL   Calcium 9.4 8.4 - 10.5 mg/dL   GFR calc non Af Amer 41 (L) >90 mL/min   GFR calc Af Amer 47 (L) >90 mL/min    Comment: (NOTE) The eGFR has been calculated using the CKD EPI equation. This calculation has not been validated in all clinical situations. eGFR's persistently <90 mL/min signify possible Chronic Kidney Disease.    Anion gap 11 5 - 15  Glucose, capillary     Status: Abnormal   Collection Time: 03/04/15 11:18 AM  Result Value Ref Range   Glucose-Capillary 176 (H) 70 - 99 mg/dL   Comment 1 Notify RN   Glucose, capillary     Status: Abnormal   Collection Time: 03/04/15  4:15 PM  Result Value Ref Range   Glucose-Capillary 118 (H) 70 - 99 mg/dL   Comment 1 Notify RN   Glucose, capillary     Status: Abnormal   Collection Time: 03/04/15 10:05 PM  Result Value Ref Range   Glucose-Capillary 128 (  H) 70 - 99 mg/dL   Comment 1 Notify RN   Glucose, capillary     Status: Abnormal   Collection Time: 03/05/15  6:58 AM  Result Value Ref Range   Glucose-Capillary 129 (H) 70 - 99 mg/dL   Comment 1 Notify RN   Glucose, capillary     Status: Abnormal   Collection Time: 03/05/15 11:49 AM  Result Value Ref Range   Glucose-Capillary 115 (H) 70 - 99 mg/dL   Comment 1 Notify RN   Glucose, capillary     Status: Abnormal   Collection Time: 03/05/15  4:34 PM  Result Value Ref Range   Glucose-Capillary 154 (H) 70 - 99 mg/dL   Comment 1 Notify RN   Glucose, capillary     Status: Abnormal   Collection Time: 03/05/15  8:50 PM  Result Value Ref Range   Glucose-Capillary 164 (H) 70 - 99 mg/dL  Creatinine, serum     Status: Abnormal   Collection Time: 03/06/15  5:47 AM  Result Value Ref Range   Creatinine, Ser 1.49 (H) 0.50 - 1.10 mg/dL   GFR calc non Af Amer 36 (L) >90 mL/min    GFR calc Af Amer 42 (L) >90 mL/min    Comment: (NOTE) The eGFR has been calculated using the CKD EPI equation. This calculation has not been validated in all clinical situations. eGFR's persistently <90 mL/min signify possible Chronic Kidney Disease.   Glucose, capillary     Status: Abnormal   Collection Time: 03/06/15  6:58 AM  Result Value Ref Range   Glucose-Capillary 168 (H) 70 - 99 mg/dL     HEENT: poor dentition Cardio: RRR and no murmur Resp: CTA B/L and unlabored GI: BS positive and mild left upper quad tenderness, mild distention Extremity:  Pulses positive and No Edema Skin:   Wound Right post leg 1.5cm granulating area no odor Neuro: Alert/Oriented, Cranial Nerve II-XII normal, Normal Sensory, Abnormal Motor 4-/5 R delt bi tri grip,4 L HF , KE ankle DF, R 5/5 except Right ankle limited by pertial foot amp and Abnormal FMC Ataxic/ dec FMC Musc/Skel:  Other Right trans met amp Gen NAD   Assessment/Plan: 1. Functional deficits secondary to right lenticular nucleus/corona radiate thrombotic infarct  which require 3+ hours per day of interdisciplinary therapy in a comprehensive inpatient rehab setting. Physiatrist is providing close team supervision and 24 hour management of active medical problems listed below. Physiatrist and rehab team continue to assess barriers to discharge/monitor patient progress toward functional and medical goals. Team conference today please see physician documentation under team conference tab, met with team face-to-face to discuss problems,progress, and goals. Formulized individual treatment plan based on medical history, underlying problem and comorbidities.    FIM: FIM - Bathing Bathing Steps Patient Completed: Chest, Right Arm, Left Arm, Abdomen, Front perineal area, Buttocks, Right upper leg, Left upper leg, Right lower leg (including foot), Left lower leg (including foot) Bathing: 5: Supervision: Safety issues/verbal cues  FIM - Upper Body  Dressing/Undressing Upper body dressing/undressing steps patient completed: Thread/unthread right sleeve of pullover shirt/dresss, Thread/unthread left sleeve of pullover shirt/dress, Put head through opening of pull over shirt/dress, Pull shirt over trunk Upper body dressing/undressing: 5: Supervision: Safety issues/verbal cues FIM - Lower Body Dressing/Undressing Lower body dressing/undressing steps patient completed: Don/Doff right sock, Don/Doff left sock, Don/Doff right shoe, Don/Doff left shoe, Fasten/unfasten right shoe, Fasten/unfasten left shoe Lower body dressing/undressing: 5: Supervision: Safety issues/verbal cues  FIM - Toileting Toileting steps completed by patient: Performs  perineal hygiene, Adjust clothing prior to toileting, Adjust clothing after toileting Toileting Assistive Devices: Grab bar or rail for support Toileting: 4: Steadying assist  FIM - [place]: Grab bars Toilet Transfers: 3-To toilet/BSC: Mod A (lift or lower assist), 4-From toilet/BSC: Min A (steadying Pt. > 75%)  FIM - Bed/Chair Transfer Bed/Chair Transfer Assistive Devices: Arm rests Bed/Chair Transfer: 4: Bed > Chair or W/C: Min A (steadying Pt. > 75%), 4: Chair or W/C > Bed: Min A (steadying Pt. > 75%)  FIM - Locomotion: Wheelchair Distance: 40 Locomotion: Wheelchair: 4: Travels 150 ft or more: maneuvers on rugs and over door sillls with minimal assistance (Pt.>75%) FIM - Locomotion: Ambulation Locomotion: Ambulation Assistive Devices: Administrator Ambulation/Gait Assistance: 4: Min assist Locomotion: Ambulation: 1: Travels less than 50 ft with minimal assistance (Pt.>75%)  Comprehension Comprehension Mode: Auditory Comprehension: 5-Follows basic conversation/direction: With no assist  Expression Expression Mode: Verbal Expression: 5-Expresses basic needs/ideas: With extra time/assistive device  Social Interaction Social Interaction: 5-Interacts  appropriately 90% of the time - Needs monitoring or encouragement for participation or interaction.  Problem Solving Problem Solving: 5-Solves complex 90% of the time/cues < 10% of the time  Memory Memory: 5-Recognizes or recalls 90% of the time/requires cueing < 10% of the time  Medical Problem List and Plan: 1. Functional deficits secondary to hypertensive right lenticular nucleus/corona radiate infarct 2. DVT Prophylaxis/Anticoagulation: Pharmaceutical: Lovenox 3. Pain Management: neurontin for peripheral neuropathy.  4. Bipolar disorder/Mood: Appears stable. Continue Lamictal bid. Klonopin as needed prn anxiety.  5. Neuropsych: This patient is capable of making decisions on her own behalf. 6. Skin/Wound Care: Continue santyl to ulcer on right shin. Routine pressure relief measures.  7. Fluids/Electrolytes/Nutrition: Monitor I/O. BUN/Creat improving with IVF , IM to address                                              8. DM type 2 with peripheral neuropathy : Monitor BS with ac/hs checks. Continue Trajenta, amaryl bid and lantus with SSI for elevated BS.   -sugars improving---continue with current regimen 9. Acute on chronic Renal failure : Monitor for stability. Labs stable.  10. Dyslipidemia: On Zocor. 11. HTN: Monitor BP every 8 hours. Continue norvasc and lisinopril.  12.  S/P  Right partial foot amp, no phantom pain 13.  Diarrhea,likely infectious, resolved , now on po Flagyll and cipro  per IM  LOS (Days) [place] E 03/06/2015, 8:25 AM

## 2015-03-06 NOTE — Progress Notes (Signed)
Occupational Therapy Session Note  Patient Details  Name: Tara Hopkins MRN: [id number] Date of Birth: 19-Mar-1951  Today's Date: 03/06/2015 OT Individual Time: 1120-1205 OT Individual Time Calculation (min): 45 min    Short Term Goals: Week 2:  OT Short Term Goal 1 (Week 2): Pt will complete tub/shower transfer with supervision with RW OT Short Term Goal 2 (Week 2): Pt will complete toilet transfer with supervision with RW OT Short Term Goal 3 (Week 2): Pt will complete grooming tasks in standing with use of LUE as gross assist  Skilled Therapeutic Interventions/Progress Updates:    Treatment with focus on functional mobility, standing balance, and LUE NMR.  Pt received asleep in bed, requiring a minute to sit at EOB to "get bearings".  Ambulated with min assist with RW to bathroom, where pt completed toileting tasks with min/steady assist with clothing management.  Hand hygiene completed in standing at sink with close supervision.  In therapy gym, engaged in [place] with use of 2# medicine ball with PNF pattern reaching progressing to ball toss with focus on symmetrical movements with BUE.  Engaged in [facility] tasks with opening and closing bill bottles and picking up small items to simulate pills.  Pt completed 9 hole peg test with 1:09 on Lt.  Zoom ball activity completed in sitting with focus on BUE symmetrical movements.  Pt returned to recliner in room in preparation for lunch.  Discussed need for family education prior to d/c home on Friday.  Therapy Documentation Precautions:  Precautions Precautions: Fall Precaution Comments: Rt mid foot amputation Restrictions Weight Bearing Restrictions: No Pain:  Pt with no c/o pain  See FIM for current functional status  Therapy/Group: Individual Therapy  [name] 03/06/2015, 12:51 PM

## 2015-03-06 NOTE — Progress Notes (Signed)
Recreational Therapy Session Note  Patient Details  Name: Tara Hopkins MRN: [id number] Date of Birth: 09/18/1951 Today's Date: 03/06/2015  Pain: no c/o Skilled Therapeutic Interventions/Progress Updates: Pt requesting to go outside this morning.  Pt performed UB/LB dressing seated EOB with supervision with exception of pulling up pants, steadying assist needed for standing to pull pants up.  Once outside, psychosocial support provided as pt was problem solving through lifestyle changes that she needed to change at discharge.  Discussion included stress and anxiety management techniques.  Pt stated understanding and appreciation for being outside.  Therapy/Group: Individual Therapy  Jamia Hoban 03/06/2015, 9:14 AM

## 2015-03-06 NOTE — Consult Note (Signed)
  INITIAL DIAGNOSTIC EVALUATION - [facility]   MEDICAL NECESSITY:  Tara Hopkins was seen on the [facility] for an initial diagnostic evaluation owing to the patient's diagnosis of cerebral infarction.   According to medical records, Tara Hopkins was admitted to the rehab unit owing to "Functional deficits secondary to hypertensive right lenticular nucleus/corona radiata infarct." Records also indicate that she is a "64 y.o. RH-female with history of HTN, DM, peripheral neuropathy, anxiety disorder; who was admitted via EMS on 02/15/15 with acute onset of left sided weakness and slurred speech.  Upon EMS evaluation BP [was] 220/130.UDS positive for cocaine. CT head negative and tPA administered. MRI/MRA brain was done revealing acute nonhemorrhagic infarcts in posterior aspect of right lenticular nucleus to posterior corona radiata and posterior limb of L-IC.Speech therapy evaluation reveals mild cognitive deficits in executive functioning and recall. She was started on ASA for thrombotic stroke due to small vessel disease."    During today's visit, Tara Hopkins denied suffering from any cognitive difficulties post-stroke. In fact, she said that her "memory [is] a lot more alert." She reported being diagnosed with Bipolar disorder and endorsed suffering from at least a moderate degree of recent depression and anxiety. She was taking Latuda successfully but could not afford it when her free samples ran out. She is only taking Klonopin at night for anxiety but is open to initiating a mood stabilizer. She is also willing to participate in counseling and realizes that she needs it.  She admitted to being under a great deal of stress right prior to having the stroke. She was moving and there had been some family strife. She also has been ruminating about her failed second marriage, as well as her general health which she feels is poor and  "overwhelming." She said that is why she used the cocaine recently (i.e., for mood and to increase energy). She is generally followed for mood disruption via her PCP. No adjustment issues endorsed. Suicidal/homicidal ideation, plan or intent was denied. No manic or hypomanic episodes were reported. The patient denied ever experiencing any auditory/visual hallucinations. No major behavioral or personality changes were endorsed.   Tara Hopkins feels that she has made progress in therapy and has thoroughly enjoyed the rehab staff, particularly her OT therapist. No barriers to therapy identified.  She suffers some trepidation about discharging home but is confident that she will succeed and her goddaughter plans to stay with her until her son is released from prison. She has ample support from other family and friends as well.   PROCEDURES ADMINISTERED: [1 unit 37858] Diagnostic clinical interview  Review of available records   SUMMARY & RECOMMENDATIONS: Tara Hopkins denied suffering from any major cognitive changes, though it is clear that significant mood disruption is present and not being well managed. It is recommended that her care providers initiate a mood stabilizer for Bipolar disorder. I recommended that she take her Klonopin sparingly. Tara Hopkins is also willing to participate in counseling upon discharge and social work agreed to provide her with resources on how to accomplish this after she returns home. I will see the patient next Monday for supportive psychotherapy if for some reason she does not discharge Friday as planned.   DIAGNOSES:  Cerebral infarction Bipolar Disorder   Rutha Bouchard, Psy.D.  Clinical Neuropsychologist

## 2015-03-06 NOTE — Progress Notes (Signed)
Social Work Elease Hashimoto, LCSW Social Worker Signed  Patient Care Conference 02/27/2015  8:07 PM    Expand All Collapse All   Inpatient RehabilitationTeam Conference and Plan of Care Update Date: 03/01/2015   Time: 8:41 AM    Patient Name: Tara Hopkins       Medical Record Number: [id number]  Date of Birth: 07/20/1951 Sex: Female         Room/Bed: 4W22C/4W22C-01 Payor Info: Payor: [place] / Plan: [employer] PPO / Product Type: *No Product type* /    Admitting Diagnosis: R CVA   Admit Date/Time:  02/20/2015  5:48 PM Admission Comments: No comment available   Primary Diagnosis:  CVA (cerebral infarction) Principal Problem: CVA (cerebral infarction)    Patient Active Problem List     Diagnosis  Date Noted   .  CVA (cerebral infarction)  02/20/2015   .  Left hemiparesis  02/20/2015   .  Cocaine abuse     .  Stroke  02/15/2015   .  Partial nontraumatic amputation of right foot  12/10/2014   .  Osteomyelitis of foot, right, acute  02/23/2014   .  Status post transmetatarsal amputation of right foot  12/15/2013   .  Diabetic foot infection with ulcer and abcess, right  05/21/2013   .  DM type 2 with diabetic peripheral neuropathy  05/21/2013   .  Benign hypertension  05/21/2013   .  Depression  05/21/2013   .  Hypokalemia  05/21/2013   .  Hyponatremia  05/21/2013   .  Anemia  05/21/2013   .  Renal insufficiency  05/21/2013     Expected Discharge Date: Expected Discharge Date: 03/08/15  Team Members Present: Physician leading conference: Dr. Alysia Penna Social Worker Present: Ovidio Kin, LCSW Nurse Present: Elliot Cousin, RN PT Present: Georjean Mode, Dustin Folks, PT OT Present: [name], OT;James Noah Delaine [place], OT SLP Present: Windell Moulding, SLP PPS Coordinator present : Daiva Nakayama, RN, CRRN        Current Status/Progress  Goal  Weekly Team Focus   Medical     Frustrated, had GI viral illness, h cocaine abuse   Sup  24/7 home d/c maintain med stability   rehydrate with IV antibiotics   Bowel/Bladder     Continent of bowel and bladder LBM 4/20   maintain cont  Timed toileting    Swallow/Nutrition/ Hydration       na         ADL's     mod assist squat pivot transfers, min assist sit > stand with UE support, min-supervision with dressing tasks  supervision overall  transfers, LUE NMR, Pt and family education    Mobility       min assist-has been sick last few days-missed therapies    supervision overall    ambulation, safety, impulsivity-education   Communication       na         Safety/Cognition/ Behavioral Observations    min assist-supervision for semi-complex tasks   supervision-mod I   semi-complex problem solving and use of compensatory memory aids.     Pain     Tylenol q 4 hrs PRN H/A  Assess effectiveness of Tylenol  Assess pain q 3-4 for effectiveness of Tylenol    Skin     (R) LE wound- patient administers self-care- cleanse with saline, apply santyl, then place allevyn  Patient continue to dressing wound with nursing staff assistance  Nursing staff assess wound q shift       *See Care Plan and progress notes for long and short-term goals.    Barriers to Discharge:  still needs physcial assist     Possible Resolutions to Barriers:   see above     Discharge Planning/Teaching Needs:   Home with god-daughter then once son is out of prison then he will provide supervision level       Team Discussion:    Doing well until got sick and refusing therapies and wanting to be in room due to bowel issues   Revisions to Treatment Plan:    QD therapies due to sickness    Continued Need for Acute Rehabilitation Level of Care: The patient requires daily medical management by a physician with specialized training in physical medicine and rehabilitation for the following conditions: Daily direction of a multidisciplinary physical rehabilitation program to ensure safe treatment while eliciting  the highest outcome that is of practical value to the patient.: Yes Daily medical management of patient stability for increased activity during participation in an intensive rehabilitation regime.: Yes Daily analysis of laboratory values and/or radiology reports with any subsequent need for medication adjustment of medical intervention for : Neurological problems;Other  Elease Hashimoto 03/01/2015, 8:41 AM  Inpatient RehabilitationTeam Conference and Plan of Care Update Date: 03/06/2015   Time: 10;55 AM     Patient Name: Tara Hopkins       Medical Record Number: [id number]  Date of Birth: Sep 23, 1951 Sex: Female         Room/Bed: 4W22C/4W22C-01 Payor Info: Payor: [place] / Plan: [facility] PPO / Product Type: *No Product type* /    Admitting Diagnosis: R CVA   Admit Date/Time:  02/20/2015  5:48 PM Admission Comments: No comment available   Primary Diagnosis:  Acute renal failure Principal Problem: Acute renal failure    Patient Active Problem List     Diagnosis  Date Noted   .  Bipolar affective disorder     .  Diarrhea  03/01/2015   .  Nausea with vomiting  03/01/2015   .  Fever presenting with conditions classified elsewhere  03/01/2015   .  Acute renal failure  03/01/2015   .  CVA (cerebral infarction)  02/20/2015   .  Left hemiparesis  02/20/2015   .  Cocaine abuse     .  Stroke  02/15/2015   .  Partial nontraumatic amputation of right foot  12/10/2014   .  Osteomyelitis of foot, right, acute  02/23/2014   .  Status post transmetatarsal amputation of right foot  12/15/2013   .  Diabetic foot infection with ulcer and abcess, right  05/21/2013   .  DM type 2 with diabetic peripheral neuropathy  05/21/2013   .  Benign hypertension  05/21/2013   .  Depression  05/21/2013   .  Hyponatremia  05/21/2013   .  Anemia  05/21/2013   .  Renal insufficiency  05/21/2013     Expected Discharge Date: Expected Discharge Date: 03/08/15  Team Members  Present: Physician leading conference: Dr. Alysia Penna Social Worker Present: Ovidio Kin, LCSW Nurse Present: Elliot Cousin, RN PT Present: Raylene Everts, PT;Caroline [place], PT OT Present: Clyda Greener, OT;Julia Saguier, Jules Schick, OT SLP Present: Windell Moulding, SLP PPS Coordinator present : Daiva Nakayama, RN, CRRN        Current Status/Progress  Goal  Weekly Team Focus   Medical  GI infx resolved  24/7 sup, lifestyle issues management   D/C planning   Bowel/Bladder       cont B & B         Swallow/Nutrition/ Hydration       na         ADL's     supervision bathing and dressing with UE support, min assist transfers  supervision overall  transfers, LUE NMR, pt and family education    Mobility       supervision/min assist-guiding. Safety risk    supervision/min level    ambulation, safety, education-doing better feeling better   Communication       na         Safety/Cognition/ Behavioral Observations      getting up and moving around the room-unassisted-may need chair alarm         Pain       monitor pain-no current issues         Skin       no issues            *See Care Plan and progress notes for long and short-term goals.    Barriers to Discharge:  Left HP with Right med foot amp     Possible Resolutions to Barriers:   family ed     Discharge Planning/Teaching Needs:   Family coming in for training tomorrow, daughter and god-daughter, aware pt will require 24 hr care.       Team Discussion:    Downgraded goals to supervision/min assist level. Family education tomorrow. Impulsivity not new. D/C Speech reached goals. High fall risk furniture walks and is walking around the room, not calling for assistance.   Revisions to Treatment Plan:    Downgraded goals to min level    Continued Need for Acute Rehabilitation Level of Care: The patient requires daily medical management by a physician with specialized training in physical medicine and  rehabilitation for the following conditions: Daily direction of a multidisciplinary physical rehabilitation program to ensure safe treatment while eliciting the highest outcome that is of practical value to the patient.: Yes Daily medical management of patient stability for increased activity during participation in an intensive rehabilitation regime.: Yes Daily analysis of laboratory values and/or radiology reports with any subsequent need for medication adjustment of medical intervention for : Neurological problems;Other  Elease Hashimoto 03/06/2015, 2:41 PM        Revision History      Date/Time User Provider Type Action    03/06/2015  2:47 PM Elease Hashimoto, LCSW Social Worker Sign    02/27/2015  8:08 PM Shari Heritage, LPN Licensed Practical Nurse [place]    View Details Report                  Patient ID: Caryle Helgeson, female   DOB: 24-Nov-1950, 64 y.o.   MRN: [id number]

## 2015-03-07 ENCOUNTER — Inpatient Hospital Stay (HOSPITAL_COMMUNITY): Payer: BC Managed Care – PPO

## 2015-03-07 ENCOUNTER — Encounter (HOSPITAL_COMMUNITY): Payer: Self-pay | Admitting: Occupational Therapy

## 2015-03-07 DIAGNOSIS — F310 Bipolar disorder, current episode hypomanic: Secondary | ICD-10-CM

## 2015-03-07 LAB — CULTURE, BLOOD (ROUTINE X 2)
CULTURE: NO GROWTH
Culture: NO GROWTH

## 2015-03-07 LAB — GLUCOSE, CAPILLARY
GLUCOSE-CAPILLARY: 210 mg/dL — AB (ref 70–99)
Glucose-Capillary: 200 mg/dL — ABNORMAL HIGH (ref 70–99)
Glucose-Capillary: 176 mg/dL — ABNORMAL HIGH (ref 70–99)
Glucose-Capillary: 270 mg/dL — ABNORMAL HIGH (ref 70–99)
Glucose-Capillary: 122 mg/dL — ABNORMAL HIGH (ref 70–99)

## 2015-03-07 MED ORDER — CITALOPRAM HYDROBROMIDE 10 MG PO TABS
10.0000 mg | ORAL_TABLET | Freq: Every day | ORAL | Status: DC
  Administered 2015-03-07: 10 mg via ORAL
  Filled 2015-03-07: qty 1

## 2015-03-07 NOTE — Progress Notes (Signed)
Occupational Therapy Discharge Summary  Patient Details  Name: Tara Hopkins MRN: [id number] Date of Birth: October 20, 1951     Patient has met 25 of 12 long term goals due to improved activity tolerance, improved balance, postural control, functional use of  RIGHT upper and RIGHT lower extremity, improved awareness and improved coordination.  Patient to discharge at overall Supervision level.  Patient's care partner is independent to provide the necessary physical and cognitive assistance at discharge.    Reasons goals not met: n/a  Recommendation:  Patient will benefit from ongoing skilled OT services in home health setting to continue to advance functional skills in the area of BADL.  Equipment: No equipment provided  Reasons for discharge: treatment goals met and discharge from hospital  Patient/family agrees with progress made and goals achieved: Yes  OT Discharge Precautions/Restrictions   fall  Pain  no c/o ADL  see FIM Vision/Perception  Vision- History Baseline Vision/History: Glaucoma;Wears glasses Patient Visual Report: No change from baseline Vision- Assessment Vision Assessment?: No apparent visual deficits  Cognition Overall Cognitive Status: History of cognitive impairments - at baseline Arousal/Alertness: Awake/alert Orientation Level: Oriented X4 Memory: Impaired Memory Impairment: Decreased recall of new information;Decreased short term memory Decreased Short Term Memory: Verbal complex;Functional complex Behaviors: Impulsive;Lability Safety/Judgment: Impaired Sensation Sensation Light Touch: Appears Intact Proprioception: Appears Intact Coordination Gross Motor Movements are Fluid and Coordinated: No Fine Motor Movements are Fluid and Coordinated: No 9 Hole Peg Test: Rt:24 seconds, Lt:1:09 Motor  Motor Motor - Discharge Observations: moves in/out of synergies LLE, but coordination limitedhemiparesis - mild: improved on right side with extra  time Mobility    bed mobility: supervision, sit to stand: supervision  Trunk/Postural Assessment  Cervical Assessment Cervical Assessment: Within Functional Limits Thoracic Assessment Thoracic Assessment: Within Functional Limits Lumbar Assessment Lumbar Assessment: Within Functional Limits Postural Control Postural Control: Deficits on evaluation Trunk Control: delayed trunk righting Postural Limitations: habit PTA of depending upon LLE in standing, due to R toe amputations    trunk  WFL with VC for postural control and upright posture, pelvis- WFL Balance Mod I static and dynamic sitting balance   min A dynamic standing balance with UE support Extremity/Trunk Assessment RUE Assessment RUE Assessment: Within Functional Limits  left UE-  3-/5 ROM at shoudler; bicep/ tricep 3+/5  Incoordination of fingers with [facility] tasks, decr motor control   See FIM for current functional status  Nicoletta Ba 03/08/2015, 4:35 PM

## 2015-03-07 NOTE — Progress Notes (Signed)
Recreational Therapy Discharge Summary Patient Details  Name: Tara Hopkins MRN: [id number] Date of Birth: September 11, 1951 Today's Date: 03/07/2015  Long term goals set: 1  Long term goals met: 1  Comments on progress toward goals: Pt has made good progress toward goal and is ready for discharge home with family to provide/coordinate 24 hour supervision.  TR sessions focused on psychosocial support and problem solving through modification/adapations to leisure pursuits once home.  Pt is anxious to return home but with increased focus on her overall health and wellness.   Reasons for discharge: discharge from hospital  Patient/family agrees with progress made and goals achieved: Yes  Xiomar Crompton 03/07/2015, 9:41 AM

## 2015-03-07 NOTE — Progress Notes (Signed)
Social Work Patient ID: Tara Hopkins, female   DOB: 06-Mar-1951, 64 y.o.   MRN: [id number] Met with Kenney Houseman who was here for education and it went well. Pt feels she is ready to go home.  One of the main goal is to teach pt to slow down. Referral made for [facility] to follow at discharge.

## 2015-03-07 NOTE — Plan of Care (Signed)
Problem: RH Grooming Goal: LTG Patient will perform grooming w/assist,cues/equip (OT) LTG: Patient will perform grooming with assist, with/without cues using equipment (OT)  Outcome: Completed/Met Date Met:  03/07/15 Goal not addressed - therapy time limited due to pt illness

## 2015-03-07 NOTE — Plan of Care (Signed)
Problem: RH Toileting Goal: LTG Patient will perform toileting w/assist, cues/equip (OT) LTG: Patient will perform toiletiing (clothes management/hygiene) with assist, with/without cues using equipment (OT)  Outcome: Adequate for Discharge Recommend supervision during toileting. Family and caregivers agree and are willing to provide.

## 2015-03-07 NOTE — Progress Notes (Signed)
Occupational Therapy Session Note  Patient Details  Name: Tara Hopkins MRN: [id number] Date of Birth: 02-19-1951  Today's Date: 03/07/2015 OT Individual Time: 1443-1540 OT Individual Time Calculation (min): 62 min    Short Term Goals: Week 2:  OT Short Term Goal 1 (Week 2): Pt will complete tub/shower transfer with supervision with RW OT Short Term Goal 2 (Week 2): Pt will complete toilet transfer with supervision with RW OT Short Term Goal 3 (Week 2): Pt will complete grooming tasks in standing with use of LUE as gross assist  Skilled Therapeutic Interventions/Progress Updates:    skilled OT session focusing on family education preparing for pt d/c at overall supervision level. Two daughters and niece present for hands-on training of supervision requirements, tub shower/toilet transfer techniques, ADL techniques, safety precautions, and DME for home use. All family members demonstrated close supervision satisfactorily. Educated family at pts request on care as a partnership requiring clear communication and maintenance of personal power and dignity. Family very receptive and reassuring to patient.    Therapy Documentation Precautions:  Precautions Precautions: Fall Precaution Comments: Rt mid foot amputation Restrictions Weight Bearing Restrictions: No    Pain: No c/o pain Pain Assessment Pain Assessment: No/denies pain  See FIM for current functional status  Therapy/Group: Individual Therapy  Parke Jandreau 03/07/2015, 12:28 PM

## 2015-03-07 NOTE — Progress Notes (Signed)
Subjective/Complaints: No issues overnite, no abd pain, n/v or diarrhea Discussed hx bipolar was on Latuda and SSRI as outpt, always received samples for Latuda from old MD, Zoloft as well Not on this med for several mo PTA  Now on Lamictal, Neurontin can be helpful as adjunct ROS - weakness on Left side, hx R midfoot amp Objective: Vital Signs: Blood pressure 135/71, pulse 65, temperature 98.4 F (36.9 C), temperature source Oral, resp. rate 16, SpO2 100 %. No results found. Results for orders placed or performed during the hospital encounter of 02/20/15 (from the past 72 hour(s))  Basic metabolic panel     Status: Abnormal   Collection Time: 03/04/15  8:40 AM  Result Value Ref Range   Sodium 138 135 - 145 mmol/L   Potassium 3.7 3.5 - 5.1 mmol/L   Chloride 110 96 - 112 mmol/L   CO2 17 (L) 19 - 32 mmol/L   Glucose, Bld 165 (H) 70 - 99 mg/dL   BUN 15 6 - 23 mg/dL   Creatinine, Ser 1.36 (H) 0.50 - 1.10 mg/dL   Calcium 9.4 8.4 - 10.5 mg/dL   GFR calc non Af Amer 41 (L) >90 mL/min   GFR calc Af Amer 47 (L) >90 mL/min    Comment: (NOTE) The eGFR has been calculated using the CKD EPI equation. This calculation has not been validated in all clinical situations. eGFR's persistently <90 mL/min signify possible Chronic Kidney Disease.    Anion gap 11 5 - 15  Glucose, capillary     Status: Abnormal   Collection Time: 03/04/15 11:18 AM  Result Value Ref Range   Glucose-Capillary 176 (H) 70 - 99 mg/dL   Comment 1 Notify RN   Glucose, capillary     Status: Abnormal   Collection Time: 03/04/15  4:15 PM  Result Value Ref Range   Glucose-Capillary 118 (H) 70 - 99 mg/dL   Comment 1 Notify RN   Glucose, capillary     Status: Abnormal   Collection Time: 03/04/15 10:05 PM  Result Value Ref Range   Glucose-Capillary 128 (H) 70 - 99 mg/dL   Comment 1 Notify RN   Glucose, capillary     Status: Abnormal   Collection Time: 03/05/15  6:58 AM  Result Value Ref Range   Glucose-Capillary 129  (H) 70 - 99 mg/dL   Comment 1 Notify RN   Glucose, capillary     Status: Abnormal   Collection Time: 03/05/15 11:49 AM  Result Value Ref Range   Glucose-Capillary 115 (H) 70 - 99 mg/dL   Comment 1 Notify RN   Glucose, capillary     Status: Abnormal   Collection Time: 03/05/15  4:34 PM  Result Value Ref Range   Glucose-Capillary 154 (H) 70 - 99 mg/dL   Comment 1 Notify RN   Glucose, capillary     Status: Abnormal   Collection Time: 03/05/15  8:50 PM  Result Value Ref Range   Glucose-Capillary 164 (H) 70 - 99 mg/dL  Creatinine, serum     Status: Abnormal   Collection Time: 03/06/15  5:47 AM  Result Value Ref Range   Creatinine, Ser 1.49 (H) 0.50 - 1.10 mg/dL   GFR calc non Af Amer 36 (L) >90 mL/min   GFR calc Af Amer 42 (L) >90 mL/min    Comment: (NOTE) The eGFR has been calculated using the CKD EPI equation. This calculation has not been validated in all clinical situations. eGFR's persistently <90 mL/min signify possible Chronic  Kidney Disease.   Glucose, capillary     Status: Abnormal   Collection Time: 03/06/15  6:58 AM  Result Value Ref Range   Glucose-Capillary 168 (H) 70 - 99 mg/dL  Glucose, capillary     Status: Abnormal   Collection Time: 03/06/15 12:12 PM  Result Value Ref Range   Glucose-Capillary 127 (H) 70 - 99 mg/dL   Comment 1 Notify RN   Glucose, capillary     Status: Abnormal   Collection Time: 03/06/15  5:22 PM  Result Value Ref Range   Glucose-Capillary 246 (H) 70 - 99 mg/dL   Comment 1 Notify RN   Glucose, capillary     Status: Abnormal   Collection Time: 03/06/15  9:50 PM  Result Value Ref Range   Glucose-Capillary 154 (H) 70 - 99 mg/dL  Glucose, capillary     Status: Abnormal   Collection Time: 03/07/15  2:45 AM  Result Value Ref Range   Glucose-Capillary 210 (H) 70 - 99 mg/dL  Glucose, capillary     Status: Abnormal   Collection Time: 03/07/15  6:35 AM  Result Value Ref Range   Glucose-Capillary 200 (H) 70 - 99 mg/dL     HEENT: poor  dentition Cardio: RRR and no murmur Resp: CTA B/L and unlabored GI: BS positive and mild left upper quad tenderness, mild distention Extremity:  Pulses positive and No Edema Skin:   Wound Right post leg 1.5cm granulating area no odor Neuro: Alert/Oriented, Cranial Nerve II-XII normal, Normal Sensory, Abnormal Motor 4-/5 R delt bi tri grip,4 L HF , KE ankle DF, R 5/5 except Right ankle limited by pertial foot amp and Abnormal FMC Ataxic/ dec FMC Musc/Skel:  Other Right trans met amp Gen NAD   Assessment/Plan: 1. Functional deficits secondary to right lenticular nucleus/corona radiate thrombotic infarct  which require 3+ hours per day of interdisciplinary therapy in a comprehensive inpatient rehab setting. Physiatrist is providing close team supervision and 24 hour management of active medical problems listed below. Physiatrist and rehab team continue to assess barriers to discharge/monitor patient progress toward functional and medical goals. Team conference today please see physician documentation under team conference tab, met with team face-to-face to discuss problems,progress, and goals. Formulized individual treatment plan based on medical history, underlying problem and comorbidities.    FIM: FIM - Bathing Bathing Steps Patient Completed: Chest, Right Arm, Left Arm, Abdomen, Front perineal area, Buttocks, Right upper leg, Left upper leg, Right lower leg (including foot), Left lower leg (including foot) Bathing: 5: Supervision: Safety issues/verbal cues  FIM - Upper Body Dressing/Undressing Upper body dressing/undressing steps patient completed: Thread/unthread right sleeve of pullover shirt/dresss, Thread/unthread left sleeve of pullover shirt/dress, Put head through opening of pull over shirt/dress, Pull shirt over trunk Upper body dressing/undressing: 5: Supervision: Safety issues/verbal cues FIM - Lower Body Dressing/Undressing Lower body dressing/undressing steps patient completed:  Don/Doff right sock, Don/Doff left sock, Don/Doff right shoe, Don/Doff left shoe, Fasten/unfasten right shoe, Fasten/unfasten left shoe Lower body dressing/undressing: 5: Supervision: Safety issues/verbal cues  FIM - Toileting Toileting steps completed by patient: Performs perineal hygiene, Adjust clothing prior to toileting, Adjust clothing after toileting Toileting Assistive Devices: Grab bar or rail for support Toileting: 4: Steadying assist  FIM - Radio producer Devices: Grab bars, Insurance account manager Transfers: 4-To toilet/BSC: Min A (steadying Pt. > 75%), 4-From toilet/BSC: Min A (steadying Pt. > 75%)  FIM - Bed/Chair Transfer Bed/Chair Transfer Assistive Devices: Adult nurse Transfer: 5: Chair or W/C > Bed: Supervision (  verbal cues/safety issues), 5: Bed > Chair or W/C: Supervision (verbal cues/safety issues)  FIM - Locomotion: Wheelchair Distance: 40 Locomotion: Wheelchair: 1: Total Assistance/staff pushes wheelchair (Pt<25%) FIM - Locomotion: Ambulation Locomotion: Ambulation Assistive Devices: Administrator Ambulation/Gait Assistance: 4: Min assist, 3: Mod assist Locomotion: Ambulation: 2: Travels 50 - 149 ft with moderate assistance (Pt: 50 - 74%)  Comprehension Comprehension Mode: Auditory Comprehension: 5-Follows basic conversation/direction: With extra time/assistive device  Expression Expression Mode: Verbal Expression: 5-Expresses basic needs/ideas: With extra time/assistive device  Social Interaction Social Interaction: 5-Interacts appropriately 90% of the time - Needs monitoring or encouragement for participation or interaction.  Problem Solving Problem Solving: 5-Solves complex 90% of the time/cues < 10% of the time  Memory Memory: 5-Recognizes or recalls 90% of the time/requires cueing < 10% of the time  Medical Problem List and Plan: 1. Functional deficits secondary to hypertensive right lenticular nucleus/corona radiate  infarct 2. DVT Prophylaxis/Anticoagulation: Pharmaceutical: Lovenox 3. Pain Management: neurontin for peripheral neuropathy.  4. Bipolar disorder/Mood: Appears stable. Continue Lamictal bid. Klonopin as needed prn anxiety.  5. Neuropsych: This patient is capable of making decisions on her own behalf. 6. Skin/Wound Care: Continue santyl to ulcer on right shin. Routine pressure relief measures.  7. Fluids/Electrolytes/Nutrition: Monitor I/O. BUN/Creat improving with IVF , IM to address                                              8. DM type 2 with peripheral neuropathy : Monitor BS with ac/hs checks. Continue Trajenta, amaryl bid and lantus with SSI for elevated BS.   -sugars improving---continue with current regimen 9. Acute on chronic Renal failure : Monitor for stability. Labs stable.  10. Dyslipidemia: On Zocor. 11. HTN: Monitor BP every 8 hours. Continue norvasc and lisinopril.  12.  S/P  Right partial foot amp, no phantom pain 13.  Bipolar d/o cont Lamictal, Neurontin, add celexa, outpt Psych f/u  LOS (Days) 15 A FACE TO FACE EVALUATION WAS PERFORMED  Colby Catanese E 03/07/2015, 8:15 AM

## 2015-03-07 NOTE — Progress Notes (Signed)
Physical Therapy Discharge Summary  Patient Details  Name: Tara Hopkins MRN: [id number] Date of Birth: [birth year]  Today's Date: 03/07/2015 PT Individual Time:0908-1018, 70 min  -     Patient has met 9 of 11 long term goals due to improved activity tolerance, improved balance, improved postural control, increased strength, ability to compensate for deficits, functional use of  left upper extremity and left lower extremity, improved attention and improved awareness.  Patient to discharge at an ambulatory level [place].   Patient's care partner is independent to provide the necessary physical and cognitive assistance at discharge.  Reasons goals not met: delayed and inadequate balance strategies in dynamic sitting and standing.  Recommendation:  Patient will benefit from ongoing skilled PT services in home health setting to continue to advance safe functional mobility, address ongoing impairments in cognition, balance, coordination, motor control, activity tolerance, and minimize fall risk.  Equipment: No equipment provided  Reasons for discharge: treatment goals met and discharge from hospital  Patient/family agrees with progress made and goals achieved: Yes   Tx today: pt sitting on toilet, washing up after bowel accident; supervision for balance.  Pt donned clothes and stood with RW, supervision.  Min gait in congested room to w/c, with Rw.  W/c propulsion using bil UEs and bil LEs for activity tolerance, x 150' with supervision.  Therapeutic activities in unsupported sitting and supported standing to challenge balance; pt with delayed awareness and response to LOB backwards and to L.    Godaughter Tara Hopkins trained today and safely return-demonstrated basic and car tranfers, gait on level, ramp and curb, stairs, management of RW and w/c, and cueing to manage pt's impulsivity and safety.  PT Discharge Precautions/Restrictions Precautions Precautions: Fall Precaution Comments: Rt mid  foot amputation Restrictions Weight Bearing Restrictions: No Vital Signs Therapy Vitals Temp: 97.8 F (36.6 C) Temp Source: Oral Pulse Rate: 70 Resp: 18 BP: (!) 160/77 mmHg Patient Position (if appropriate): Lying Oxygen Therapy SpO2: 99 % Pain Pain Assessment Pain Assessment: No/denies pain Vision/Perception - glaucoma, wears glasses at times    Cognition Overall Cognitive Status: History of cognitive impairments - at baseline Arousal/Alertness: Awake/alert Orientation Level: Oriented X4 Memory: Impaired Memory Impairment: Decreased recall of new information;Decreased short term memory Decreased Short Term Memory: Verbal complex;Functional complex Behaviors: Impulsive;Lability Safety/Judgment: Impaired Sensation Sensation Light Touch: Appears Intact Proprioception: Appears Intact Additional Comments: Sensation appeared intact on eval, but pt has a history of B LE neuropathy, with rare tingling sensation Coordination Gross Motor Movements are Fluid and Coordinated: No Fine Motor Movements are Fluid and Coordinated: No Heel Shin Test: = bil 9 Hole Peg Test: Rt:24 seconds, Lt:1:09 Motor  Motor Motor: Hemiplegia Motor - Discharge Observations: moves in/out of synergies LLE, but coordination limited  Mobility Bed Mobility Bed Mobility:  (modified Independent for all) Transfers Transfers: Yes Sit to Stand: 5: Supervision Squat Pivot Transfers: 5: Supervision Locomotion  Ambulation Ambulation: Yes Ambulation/Gait Assistance: 4: Min assist Ambulation Distance (Feet): 80 Feet Assistive device: Rolling walker Ambulation/Gait Assistance Details: Verbal cues for gait pattern;Verbal cues for precautions/safety Gait Gait: Yes Gait Pattern: Impaired Gait Pattern: Decreased trunk rotation;Narrow base of support;Poor foot clearance - left;Decreased hip/knee flexion - left;Decreased hip/knee flexion - right;Decreased dorsiflexion - left Gait velocity: decreased Stairs /  Additional Locomotion Stairs: Yes Stairs Assistance: 4: Min assist Stairs Assistance Details: Verbal cues for gait pattern;Verbal cues for precautions/safety Stair Management Technique: Two rails;Step to pattern;Forwards Number of Stairs: 4 Height of Stairs: 7 Ramp: 4: Min assist Curb: 4:  Min Chemical engineer: Yes Wheelchair Assistance: 5: Investment banker, operational Details: Verbal cues for Marketing executive: Both upper extremities;Both lower extermities Wheelchair Parts Management: Needs assistance Distance: 150  Trunk/Postural Assessment  Cervical Assessment Cervical Assessment: Within Functional Limits Thoracic Assessment Thoracic Assessment: Within Functional Limits Lumbar Assessment Lumbar Assessment: Within Functional Limits Postural Control Postural Control: Deficits on evaluation Trunk Control: delayed trunk righting Postural Limitations: habit PTA of depending upon LLE in standing, due to R toe amputations   Balance Balance Balance Assessed: Yes Static Sitting Balance Static Sitting - Balance Support: No upper extremity supported;Feet unsupported Static Sitting - Level of Assistance: 7: Independent Dynamic Sitting Balance Dynamic Sitting - Balance Support: Feet supported Dynamic Sitting - Level of Assistance: 6: Modified independent (Device/Increase time) Static Standing Balance Static Standing - Balance Support: No upper extremity supported (pt exhibits 0 balance strategies with gentle external perturbations) Static Standing - Level of Assistance: 5: Stand by assistance (LOB posteriorly upon sit> stand) Dynamic Standing Balance Dynamic Standing - Balance Support: Bilateral upper extremity supported Dynamic Standing - Level of Assistance: 3: Mod assist Dynamic Standing - Balance Activities: Lateral lean/weight shifting Extremity Assessment  RUE Assessment RUE Assessment: Within Functional Limits LUE  Assessment LUE Assessment: Exceptions to [facility] (ROM WFL, impaired motor strength and controll, strength grossly 4/5 shoulder, 4/5 bicep/tricep) RLE Assessment RLE Assessment: Within Functional Limits LLE Assessment LLE Assessment: Exceptions to [facility] LLE Strength LLE Overall Strength: Deficits LLE Overall Strength Comments: Pt demonstrated strength limitations in L hip flex, ankle dorsiflexion and planterflexion, not formally tested  See FIM for current functional status  Karsen Nakanishi 03/07/2015, 4:40 PM

## 2015-03-07 NOTE — Plan of Care (Signed)
Problem: RH Simple Meal Prep Goal: LTG Patient will perform simple meal prep w/assist (OT) LTG: Patient will perform simple meal prep with assistance, with/without cues (OT).  Did not address during therapy. Treatment hours reduced due to pt illness.

## 2015-03-08 DIAGNOSIS — K529 Noninfective gastroenteritis and colitis, unspecified: Secondary | ICD-10-CM | POA: Diagnosis not present

## 2015-03-08 LAB — GLUCOSE, CAPILLARY: Glucose-Capillary: 156 mg/dL — ABNORMAL HIGH (ref 70–99)

## 2015-03-08 MED ORDER — CIPROFLOXACIN HCL 250 MG PO TABS: 250.0000 mg | ORAL_TABLET | Freq: Two times a day (BID) | ORAL | Status: DC

## 2015-03-08 MED ORDER — INSULIN GLARGINE 100 UNIT/ML ~~LOC~~ SOLN: 5.0000 [IU] | Freq: Every day | SUBCUTANEOUS | Status: DC

## 2015-03-08 MED ORDER — TRAZODONE HCL 50 MG PO TABS: 50.0000 mg | ORAL_TABLET | Freq: Every evening | ORAL | Status: DC | PRN

## 2015-03-08 MED ORDER — ASPIRIN 325 MG PO TBEC
325.0000 mg | DELAYED_RELEASE_TABLET | Freq: Every day | ORAL | Status: DC
Start: 2015-03-08 — End: 2017-07-23

## 2015-03-08 MED ORDER — METRONIDAZOLE 250 MG PO TABS: 250.0000 mg | ORAL_TABLET | Freq: Three times a day (TID) | ORAL | Status: DC

## 2015-03-08 MED ORDER — GABAPENTIN 300 MG PO CAPS: 600.0000 mg | ORAL_CAPSULE | Freq: Two times a day (BID) | ORAL | Status: DC

## 2015-03-08 MED ORDER — ONDANSETRON HCL 4 MG PO TABS: 4.0000 mg | ORAL_TABLET | Freq: Three times a day (TID) | ORAL | Status: DC | PRN

## 2015-03-08 MED ORDER — INSULIN GLARGINE 100 UNIT/ML ~~LOC~~ SOLN: 10.0000 [IU] | Freq: Every day | SUBCUTANEOUS | Status: DC

## 2015-03-08 MED ORDER — CALCIUM POLYCARBOPHIL 625 MG PO TABS: 1250.0000 mg | ORAL_TABLET | Freq: Every day | ORAL | Status: DC

## 2015-03-08 MED ORDER — ACETAMINOPHEN 500 MG PO TABS: 500.0000 mg | ORAL_TABLET | Freq: Four times a day (QID) | ORAL | Status: DC | PRN

## 2015-03-08 MED ORDER — CITALOPRAM HYDROBROMIDE 10 MG PO TABS: 10.0000 mg | ORAL_TABLET | Freq: Every day | ORAL | Status: DC

## 2015-03-08 NOTE — Discharge Instructions (Signed)
Inpatient Rehab Discharge Instructions  Tara Hopkins Discharge date and time:  03/08/15   Activities/Precautions/ Functional Status: Activity: activity as tolerated Diet: diabetic diet Wound Care: Cleanse area on right shin with soap and water, pat dry and apply santyl daily.  Functional status:  ___ No restrictions     ___ Walk up steps independently _X__ 24/7 supervision/assistance   ___ Walk up steps with assistance ___ Intermittent supervision/assistance  ___ Bathe/dress independently _X__ Walk with walker    _X__ Bathe/dress with assistance ___ Walk Independently    ___ Shower independently _X__ Walk with assistance    ___ Shower with assistance _X__ No alcohol     ___ Return to work/school ________  Special Instructions: 1. Check blood sugars before meals and at bedtime. If blood sugars are over consistently over 150 then increase your lantus by 5 units daily to 30 units daily as you were taking before. Monitor your die.   2. Do not take saxaglyptin or glimepiride. 3. Take a probiotic daily.  4. Do not take any mediations that is not on this list.   COMMUNITY REFERRALS UPON DISCHARGE:    Home Health:   PT, OT, RN  [place]   Date of last service:03/08/2015  Medical Equipment/Items Ordered:NONE NEEDED HAD FROM PREVIOUS ADMITS     GENERAL COMMUNITY RESOURCES FOR PATIENT/FAMILY: Support Groups:[facility] SUPPORT GROUP  STROKE/TIA DISCHARGE INSTRUCTIONS SMOKING Cigarette smoking nearly doubles your risk of having a stroke & is the single most alterable risk factor  If you smoke or have smoked in the last 12 months, you are advised to quit smoking for your health.  Most of the excess cardiovascular risk related to smoking disappears within a year of stopping.  Ask you doctor about anti-smoking medications  [place] Quit Line: 1-800-QUIT NOW  Free Smoking Cessation Classes [phone]  CHOLESTEROL Know your levels; limit fat & cholesterol in  your diet  Lipid Panel     Component Value Date/Time   CHOL 195 02/16/2015 0300   TRIG 341* 02/16/2015 0300   HDL 39* 02/16/2015 0300   CHOLHDL 5.0 02/16/2015 0300   VLDL 68* 02/16/2015 0300   LDLCALC 88 02/16/2015 0300      Many patients benefit from treatment even if their cholesterol is at goal.  Goal: Total Cholesterol (CHOL) less than 160  Goal:  Triglycerides (TRIG) less than 150  Goal:  HDL greater than 40  Goal:  LDL (LDLCALC) less than 100   BLOOD PRESSURE American Stroke Association blood pressure target is less that 120/80 mm/Hg  Your discharge blood pressure is:  BP: 128/68 mmHg  Monitor your blood pressure  Limit your salt and alcohol intake  Many individuals will require more than one medication for high blood pressure  DIABETES (A1c is a blood sugar average for last 3 months) Goal HGBA1c is under 7% (HBGA1c is blood sugar average for last 3 months)  Diabetes:     Lab Results  Component Value Date   HGBA1C 8.9* 02/16/2015     Your HGBA1c can be lowered with medications, healthy diet, and exercise.  Check your blood sugar as directed by your physician  Call your physician if you experience unexplained or low blood sugars.  PHYSICAL ACTIVITY/REHABILITATION Goal is 30 minutes at least 4 days per week  Activity: No driving, Therapies: See above Return to work:  N/A  Activity decreases your risk of heart attack and stroke and makes your heart stronger.  It helps control your weight and blood  pressure; helps you relax and can improve your mood.  Participate in a regular exercise program.  Talk with your doctor about the best form of exercise for you (dancing, walking, swimming, cycling).  DIET/WEIGHT Goal is to maintain a healthy weight  Your discharge diet is: Diet Carb Modified Fluid consistency::  ThinYour height is:    Your current weight is:   Your Body Mass Index (BMI) is:     Following the type of diet specifically designed for you will help  prevent another stroke.  Your goal weight is:    Your goal Body Mass Index (BMI) is 19-24.  Healthy food habits can help reduce 3 risk factors for stroke:  High cholesterol, hypertension, and excess weight.  RESOURCES Stroke/Support Group:  Call [phone]   STROKE EDUCATION PROVIDED/REVIEWED AND GIVEN TO PATIENT Stroke warning signs and symptoms How to activate emergency medical system (call 911). Medications prescribed at discharge. Need for follow-up after discharge. Personal risk factors for stroke. Pneumonia vaccine given:  Flu vaccine given:  My questions have been answered, the writing is legible, and I understand these instructions.  I will adhere to these goals & educational materials that have been provided to me after my discharge from the hospital.      My questions have been answered and I understand these instructions. I will adhere to these goals and the provided educational materials after my discharge from the hospital.  Patient/Caregiver Signature _______________________________ Date __________  Clinician Signature _______________________________________ Date __________  Please bring this form and your medication list with you to all your follow-up doctor's appointments.

## 2015-03-08 NOTE — Progress Notes (Signed)
Social Work Discharge Note Discharge Note  The overall goal for the admission was met for:   Discharge location: [place]  Length of Stay: Yes-16 DAYS  Discharge activity level: Yes-SUPERVISION LEVEL  Home/community participation: Yes  Services provided included: MD, RD, PT, OT, SLP, RN, CM, Pharmacy, Neuropsych and SW  Financial Services: Private Insurance: [employer]BS-[employer]  Follow-up services arranged: Home Health: [facility]-PT, OT, RN and Patient/Family request agency HH: PREF USED BEFORE, DME: NO NEEDS  Comments (or additional information):FAMILY EDUCATION COMPLETED YESTERDAY AND ALL WENT WELL.  PT NEEDS TO SLOW DOWN AND TAKE HER TIME-SHE IS A FALL RISK, TENDS TO FURNITURE WALK HC-POA PAPERS COMPLETED WHILE HERE.  Patient/Family verbalized understanding of follow-up arrangements: Yes  Individual responsible for coordination of the follow-up plan: PATIENT & TONYA  Confirmed correct DME delivered: Elease Hashimoto 03/08/2015    Elease Hashimoto

## 2015-03-08 NOTE — Progress Notes (Signed)
Pt discharged to home. Pt did not notify staff that pt was leaving or call staff to help her to the car. Pam PA notified.

## 2015-03-08 NOTE — Progress Notes (Signed)
Pt family contacted about lack of discharge instructions. Pt instructed that she could leave when the doctor has been by to give discharge instructions. Pt left without giving calling staff to help her to her car.  Family contacted to attempt to give theses discharge instructions. Awaiting call from family. Agricultural consultant and PA [employer] notified.

## 2015-03-08 NOTE — Progress Notes (Deleted)
Pt discharged to home with family. Pt given discharge instructions and is prepared for discharge.

## 2015-03-08 NOTE — Progress Notes (Addendum)
Subjective/Complaints: No issues overnite, no abd pain, n/v or diarrhea "Ready to go home" ROS - weakness on Left side, hx R midfoot amp Objective: Vital Signs: Blood pressure 144/75, pulse 76, temperature 98.7 F (37.1 C), temperature source Oral, resp. rate 18, SpO2 97 %. No results found. Results for orders placed or performed during the hospital encounter of 02/20/15 (from the past 72 hour(s))  Glucose, capillary     Status: Abnormal   Collection Time: 03/05/15 11:49 AM  Result Value Ref Range   Glucose-Capillary 115 (H) 70 - 99 mg/dL   Comment 1 Notify RN   Glucose, capillary     Status: Abnormal   Collection Time: 03/05/15  4:34 PM  Result Value Ref Range   Glucose-Capillary 154 (H) 70 - 99 mg/dL   Comment 1 Notify RN   Glucose, capillary     Status: Abnormal   Collection Time: 03/05/15  8:50 PM  Result Value Ref Range   Glucose-Capillary 164 (H) 70 - 99 mg/dL  Creatinine, serum     Status: Abnormal   Collection Time: 03/06/15  5:47 AM  Result Value Ref Range   Creatinine, Ser 1.49 (H) 0.50 - 1.10 mg/dL   GFR calc non Af Amer 36 (L) >90 mL/min   GFR calc Af Amer 42 (L) >90 mL/min    Comment: (NOTE) The eGFR has been calculated using the CKD EPI equation. This calculation has not been validated in all clinical situations. eGFR's persistently <90 mL/min signify possible Chronic Kidney Disease.   Glucose, capillary     Status: Abnormal   Collection Time: 03/06/15  6:58 AM  Result Value Ref Range   Glucose-Capillary 168 (H) 70 - 99 mg/dL  Glucose, capillary     Status: Abnormal   Collection Time: 03/06/15 12:12 PM  Result Value Ref Range   Glucose-Capillary 127 (H) 70 - 99 mg/dL   Comment 1 Notify RN   Glucose, capillary     Status: Abnormal   Collection Time: 03/06/15  5:22 PM  Result Value Ref Range   Glucose-Capillary 246 (H) 70 - 99 mg/dL   Comment 1 Notify RN   Glucose, capillary     Status: Abnormal   Collection Time: 03/06/15  9:50 PM  Result Value Ref  Range   Glucose-Capillary 154 (H) 70 - 99 mg/dL  Glucose, capillary     Status: Abnormal   Collection Time: 03/07/15  2:45 AM  Result Value Ref Range   Glucose-Capillary 210 (H) 70 - 99 mg/dL  Glucose, capillary     Status: Abnormal   Collection Time: 03/07/15  6:35 AM  Result Value Ref Range   Glucose-Capillary 200 (H) 70 - 99 mg/dL  Glucose, capillary     Status: Abnormal   Collection Time: 03/07/15 12:08 PM  Result Value Ref Range   Glucose-Capillary 176 (H) 70 - 99 mg/dL  Glucose, capillary     Status: Abnormal   Collection Time: 03/07/15  4:51 PM  Result Value Ref Range   Glucose-Capillary 270 (H) 70 - 99 mg/dL   Comment 1 Notify RN   Glucose, capillary     Status: Abnormal   Collection Time: 03/07/15  9:01 PM  Result Value Ref Range   Glucose-Capillary 122 (H) 70 - 99 mg/dL  Glucose, capillary     Status: Abnormal   Collection Time: 03/08/15  6:34 AM  Result Value Ref Range   Glucose-Capillary 156 (H) 70 - 99 mg/dL     HEENT: poor dentition Cardio: RRR  and no murmur Resp: CTA B/L and unlabored GI: BS positive and mild left upper quad tenderness, mild distention Extremity:  Pulses positive and No Edema Skin:   Wound Right post leg 1.5cm granulating area no odor Neuro: Alert/Oriented, Cranial Nerve II-XII normal, Normal Sensory, Abnormal Motor 4-/5 R delt bi tri grip,4 L HF , KE ankle DF, R 5/5 except Right ankle limited by pertial foot amp and Abnormal FMC Ataxic/ dec FMC Musc/Skel:  Other Right trans met amp Gen NAD   Assessment/Plan: 1. Functional deficits secondary to right lenticular nucleus/corona radiate thrombotic infarct   Stable for D/C today F/u PCP in 1-2 weeks F/u PM&R 3 weeks See D/C summary See D/C instructions     FIM: FIM - Bathing Bathing Steps Patient Completed: Chest, Right Arm, Left Arm, Abdomen, Front perineal area, Buttocks, Right upper leg, Left upper leg, Right lower leg (including foot), Left lower leg (including foot) Bathing: 5:  Supervision: Safety issues/verbal cues  FIM - Upper Body Dressing/Undressing Upper body dressing/undressing steps patient completed: Thread/unthread right sleeve of pullover shirt/dresss, Thread/unthread left sleeve of pullover shirt/dress, Put head through opening of pull over shirt/dress, Pull shirt over trunk Upper body dressing/undressing: 5: Supervision: Safety issues/verbal cues FIM - Lower Body Dressing/Undressing Lower body dressing/undressing steps patient completed: Don/Doff right sock, Don/Doff left sock, Don/Doff right shoe, Don/Doff left shoe, Fasten/unfasten right shoe, Fasten/unfasten left shoe, Pull pants up/down Lower body dressing/undressing: 5: Supervision: Safety issues/verbal cues  FIM - Toileting Toileting steps completed by patient: Performs perineal hygiene, Adjust clothing prior to toileting, Adjust clothing after toileting Toileting Assistive Devices: Grab bar or rail for support Toileting: 4: Steadying assist  FIM - Radio producer Devices: Grab bars, Insurance account manager Transfers: 5-To toilet/BSC: Supervision (verbal cues/safety issues), 5-From toilet/BSC: Supervision (verbal cues/safety issues)  FIM - Control and instrumentation engineer Devices: Copy: 5: Chair or W/C > Bed: Supervision (verbal cues/safety issues), 5: Bed > Chair or W/C: Supervision (verbal cues/safety issues)  FIM - Locomotion: Wheelchair Distance: 150 Locomotion: Wheelchair: 1: Total Assistance/staff pushes wheelchair (Pt<25%) FIM - Locomotion: Ambulation Locomotion: Ambulation Assistive Devices: Administrator Ambulation/Gait Assistance: 4: Min assist Locomotion: Ambulation: 2: Travels 50 - 149 ft with moderate assistance (Pt: 50 - 74%)  Comprehension Comprehension Mode: Auditory Comprehension: 5-Understands basic 90% of the time/requires cueing < 10% of the time  Expression Expression Mode: Verbal Expression: 5-Expresses  complex 90% of the time/cues < 10% of the time  Social Interaction Social Interaction: 5-Interacts appropriately 90% of the time - Needs monitoring or encouragement for participation or interaction.  Problem Solving Problem Solving: 5-Solves complex 90% of the time/cues < 10% of the time  Memory Memory: 5-Recognizes or recalls 90% of the time/requires cueing < 10% of the time  Medical Problem List and Plan: 1. Functional deficits secondary to hypertensive right lenticular nucleus/corona radiate infarct 2. DVT Prophylaxis/Anticoagulation: Pharmaceutical: Lovenox 3. Pain Management: neurontin for peripheral neuropathy.  4. Bipolar disorder/Mood: Appears stable. Continue Lamictal bid. Klonopin as needed prn anxiety.  5. Neuropsych: This patient is capable of making decisions on her own behalf. 6. Skin/Wound Care: Continue santyl to ulcer on right shin. Routine pressure relief measures.  7. Fluids/Electrolytes/Nutrition: Monitor I/O. BUN/Creat improving with IVF , IM to address  8. DM type 2 with peripheral neuropathy : Monitor BS with ac/hs checks. Continue Trajenta, amaryl bid and lantus with SSI for elevated BS.   -sugars improving---continue with current regimen 9. Acute on chronic Renal failure : Monitor for stability. Labs stable.  10. Dyslipidemia: On Zocor. 11. HTN: Monitor BP every 8 hours. Continue norvasc and lisinopril.  12.  S/P  Right partial foot amp, no phantom pain 13.  Bipolar d/o cont Lamictal, Neurontin, add celexa, outpt Psych f/u  LOS (Days) 16 A FACE TO FACE EVALUATION WAS PERFORMED  Keyaira Clapham E 03/08/2015, 7:59 AM

## 2015-03-08 NOTE — Discharge Summary (Signed)
Physician Discharge Summary  Patient ID: Tara Hopkins MRN: [id number] DOB/AGE: 63-Nov-1952 63 y.o.  Admit date: 02/20/2015 Discharge date: 03/08/2015  Discharge Diagnoses:  Principal Problem:   CVA (cerebral infarction) Active Problems:   DM type 2 with diabetic peripheral neuropathy   Left hemiparesis   Diarrhea   Nausea with vomiting   Fever presenting with conditions classified elsewhere   Acute renal failure   Bipolar affective disorder   Acute gastroenteritis   Discharged Condition: stable  Significant Diagnostic Studies: Ct Abdomen Pelvis Wo Contrast  03/01/2015   CLINICAL DATA:  Diarrhea and nausea for days.  EXAM: CT ABDOMEN AND PELVIS WITHOUT CONTRAST  TECHNIQUE: Multidetector CT imaging of the abdomen and pelvis was performed following the standard protocol without IV contrast.  COMPARISON:  None.  FINDINGS: Lung bases demonstrate mild heterogeneous opacification over the right base which may be due to infection versus atelectasis. There is mild cardiomegaly.  Abdominal images demonstrate the liver, spleen, gallbladder, pancreas and adrenal glands to be within normal. Kidneys are normal in size without evidence of focal mass or nephrolithiasis. No significant hydronephrosis. Ureters are unremarkable. Appendix not well visualized. Mild calcified plaque over the abdominal aorta. No significant free fluid or focal inflammatory change noted.  Pelvic images demonstrate the bladder, uterus, ovaries and rectosigmoid colon to be within normal. No free fluid or adenopathy. Mild degenerate change of the spine and hips. Moderate degenerate disc disease at the L3-4 level.  IMPRESSION: No acute findings in the abdomen/ pelvis.  Airspace process over the right lower lobe likely pneumonia and less likely atelectasis.  Mild cardiomegaly.  Significant degenerative disc disease at the L3-4 level.   Electronically Signed   By: Marin Olp M.D.   On: 03/01/2015 18:01   Dg Abd 1 View  02/26/2015    CLINICAL DATA:  Diarrhea  EXAM: ABDOMEN - 1 VIEW  COMPARISON:  None.  FINDINGS: Nonobstructive bowel gas pattern. No free air on this supine image. No organomegaly or suspicious calcification. Calcified phleboliths in the pelvis.  Degenerative changes in the lower lumbar spine and hips. No acute bony abnormality.  IMPRESSION: No acute findings.   Electronically Signed   By: Rolm Baptise M.D.   On: 02/26/2015 08:12    Labs:  Basic Metabolic Panel:    Component Value Date/Time   NA 138 03/04/2015 0840   K 3.7 03/04/2015 0840   CL 110 03/04/2015 0840   CO2 17* 03/04/2015 0840   GLUCOSE 165* 03/04/2015 0840   BUN 15 03/04/2015 0840   CREATININE 1.49* 03/06/2015 0547   CALCIUM 9.4 03/04/2015 0840   GFRNONAA 36* 03/06/2015 0547   GFRAA 42* 03/06/2015 0547    CBC: CBC Latest Ref Rng 03/02/2015 03/01/2015 02/26/2015  WBC 4.0 - 10.5 K/uL 6.7 6.7 9.2  Hemoglobin 12.0 - 15.0 g/dL 10.1(L) 11.2(L) 11.4(L)  Hematocrit 36.0 - 46.0 % 31.5(L) 34.6(L) 35.4(L)  Platelets 150 - 400 K/uL 312 340 365     CBG:  Recent Labs Lab 03/07/15 1651 03/07/15 2101 03/08/15 0634  GLUCAP 270* 122* 156*     Brief HPI:   Tara Hopkins is a 64 y.o. RH-female with history of HTN, DM, peripheral neuropathy, anxiety disorder; who was admitted via EMS on 02/15/15 with acute onset of left sided weakness and slurred speech. Upon EMS evaluation BP 220/130 and CBG of 224 and she was treated with IV labetalol. UDS positive for cocaine. CT head negative and tPA administered. MRI/MRA brain was done revealing acute nonhemorrhagic infarcts in posterior  aspect of right lenticular nucleus to posterior corona radiata and posterior limb of L-IC. 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  Speech therapy evaluation reveals mild cognitive deficits in executive functioning and recall. 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 Patient with resultant left hemiparesis  superimposed on chronic gait disorder and CIR was recommended by MD and rehab team.    Hospital Course: Tara Hopkins was admitted to rehab 02/20/2015 for inpatient therapies to consist of PT, ST and OT at least three hours five days a week. Past admission physiatrist, therapy team and rehab RN have worked together to provide customized collaborative inpatient rehab. She was maintained on ASA for CVA prophylaxis. She developed N/V with profuse diarrhea on 4/19 due to viral gastroenteritis.  Stool was negative for C diff but she developed acute renal failure due to this requiring IVF for hydration. This did resolve after 48 hours but on 04/22 she developed fever of 101 with recurrent diarrhea, nausea and abdominal pain.  Blood cultures X 2 as well as CXR was negative.   CT abdomen/pelvis showed no acute finding but RLL airspace process noted likely due to PNA.  Dr. Tyrell Antonio was consulted for assistance with  Medical management and recommended 10 day course of Cipro for PNA as well as Flagyl for likely gastroenteritis.  Stools were checked for C diff as well as GI pathogens and all studies were negative.    Patient deffervesced within 24 hours and had improvement in GI symptoms. IVF were d/c on 04/24 and creatinine has stabilized out at 1.49.   She is currently tolerating regular foods without any recurrent symptoms and po intake has been good. Hypoglycemia has resolved and blood sugars were starting to trend upwards. She was started on lantus 5 units at discharge with recommendations to titrate up to home dose by 5 units every 48 hours if BS were over consistently over 150. She is to continue Cipro and Flagyl for three additional days past discharge. Patient has made good gains during her rehab stay and has progressed to supervision level. Supervision recommended due to impulsivity and poor safety awareness. She will continue to receive follow up [place] [place] [facility] [place] by [place] past discharge.      Rehab course: During patient's stay in rehab weekly team conferences were held to monitor patient's progress, set goals and discuss barriers to discharge. At admission, patient required max assist with ADL tasks and moderate assistance with mobility. Cognitive evaluation revealed mild cognitive impairments impacting memory, safety awareness, and executive function with complex tasks which impacted safety with functional tasks.  She has had improvement in activity tolerance, balance, postural control, as well as ability to compensate for deficits.  She is has had improvement in functional use LUE  and LLE as well as improved awareness   Disposition: 01-Home or Self Care  Diet: Diabetic diet.   Special Instructions: 1. Continue santyl with damp dressing to left shin daily. 2. Check blood sugars before meals and at bedtime. If blood sugars are over consistently over 150 then increase your lantus by 5 units daily to 30 units daily as you were taking before.      Medication List    STOP taking these medications        glimepiride 4 MG tablet  Commonly known as:  AMARYL     lisinopril 10 MG tablet  Commonly known as:  PRINIVIL,ZESTRIL     saxagliptin  HCl 5 MG Tabs tablet  Commonly known as:  ONGLYZA      TAKE these medications        acetaminophen 500 MG tablet  Commonly known as:  TYLENOL  Take 1 tablet (500 mg total) by mouth every 6 (six) hours as needed for headache.     aspirin 325 MG EC tablet  Take 1 tablet (325 mg total) by mouth daily.     ciprofloxacin 250 MG tablet  Commonly known as:  CIPRO  Take 1 tablet (250 mg total) by mouth 2 (two) times daily.     citalopram 10 MG tablet  Commonly known as:  CELEXA  Take 1 tablet (10 mg total) by mouth daily.     clonazePAM 0.5 MG tablet  Commonly known as:  KLONOPIN  take 1 tablet by mouth three times a day if needed     COD LIVER OIL PO  Take 1 capsule by mouth daily.     collagenase ointment  Commonly known as:   SANTYL  Apply 1 application topically at bedtime. Apply to right ankle for wound care     gabapentin 300 MG capsule  Commonly known as:  NEURONTIN  Take 2 capsules (600 mg total) by mouth 2 (two) times daily.     glucose blood test strip  Use as instructed     insulin aspart 100 UNIT/ML FlexPen  Commonly known as:  NOVOLOG FLEXPEN  Inject 2-6 units into skin 3 times daily     insulin glargine 100 UNIT/ML injection  Commonly known as:  LANTUS  Inject 0.05 mLs (5 Units total) into the skin at bedtime.     lamoTRIgine 100 MG tablet  Commonly known as:  LAMICTAL  take 1/2 tablet by mouth twice a day     latanoprost 0.005 % ophthalmic solution  Commonly known as:  XALATAN  place 1 drop into both eyes at bedtime     metroNIDAZOLE 250 MG tablet  Commonly known as:  FLAGYL  Take 1 tablet (250 mg total) by mouth every 8 (eight) hours.     ondansetron 4 MG tablet  Commonly known as:  ZOFRAN  Take 1 tablet (4 mg total) by mouth every 8 (eight) hours as needed for nausea or vomiting.     ONE TOUCH LANCETS Misc  USE TO CHECK BLOOD SUGAR 3 TIMES DAILY     polycarbophil 625 MG tablet  Commonly known as:  FIBERCON  Take 2 tablets (1,250 mg total) by mouth daily.     simvastatin 20 MG tablet  Commonly known as:  ZOCOR  Take 1 tablet (20 mg total) by mouth at bedtime.     traZODone 50 MG tablet  Commonly known as:  DESYREL  Take 1 tablet (50 mg total) by mouth at bedtime as needed for sleep.     white petrolatum Gel  Commonly known as:  VASELINE  Apply 1 application topically at bedtime. Apply to both feet     zinc gluconate 50 MG tablet  Take 50 mg by mouth 2 (two) times daily.       Follow-up Information    Follow up with Charlett Blake, MD.   Specialty:  Physical Medicine and Rehabilitation   Why:  Be there at    for    Contact information:   [place] [address] [phone]       Follow up with Xu,Jindong, MD. Call today.    Specialty:  Neurology  Why:  for follow up appointment in 4 weeks   Contact information:   [address] [place] [address] [phone]       Follow up with Kennyth Arnold, FNP On 03/19/2015.   Specialty:  Family Medicine   Why:  APPT @ 9;00 am   Contact information:   [address] [place] [address] [phone]       Signed: Bary Leriche 03/14/2015, 3:01 PM

## 2015-03-19 ENCOUNTER — Ambulatory Visit: Payer: Self-pay | Admitting: Adult Health

## 2015-03-20 ENCOUNTER — Encounter (HOSPITAL_BASED_OUTPATIENT_CLINIC_OR_DEPARTMENT_OTHER): Payer: BC Managed Care – PPO | Attending: Surgery

## 2015-03-20 DIAGNOSIS — E11622 Type 2 diabetes mellitus with other skin ulcer: Secondary | ICD-10-CM | POA: Insufficient documentation

## 2015-03-20 DIAGNOSIS — I639 Cerebral infarction, unspecified: Secondary | ICD-10-CM | POA: Insufficient documentation

## 2015-03-22 ENCOUNTER — Telehealth: Payer: Self-pay

## 2015-03-22 NOTE — Telephone Encounter (Signed)
Left message for pt to call back concerning need for mammogram

## 2015-03-25 ENCOUNTER — Ambulatory Visit: Payer: Self-pay | Admitting: Adult Health

## 2015-04-01 ENCOUNTER — Encounter: Payer: Self-pay | Admitting: Physical Medicine & Rehabilitation

## 2015-04-01 ENCOUNTER — Ambulatory Visit (HOSPITAL_BASED_OUTPATIENT_CLINIC_OR_DEPARTMENT_OTHER): Payer: BC Managed Care – PPO | Admitting: Physical Medicine & Rehabilitation

## 2015-04-01 ENCOUNTER — Encounter: Payer: BC Managed Care – PPO | Attending: Physical Medicine & Rehabilitation

## 2015-04-01 VITALS — BP 177/92 | HR 81 | Resp 14

## 2015-04-01 DIAGNOSIS — G8190 Hemiplegia, unspecified affecting unspecified side: Secondary | ICD-10-CM | POA: Insufficient documentation

## 2015-04-01 DIAGNOSIS — Z89431 Acquired absence of right foot: Secondary | ICD-10-CM | POA: Insufficient documentation

## 2015-04-01 DIAGNOSIS — G629 Polyneuropathy, unspecified: Secondary | ICD-10-CM | POA: Insufficient documentation

## 2015-04-01 DIAGNOSIS — E1142 Type 2 diabetes mellitus with diabetic polyneuropathy: Secondary | ICD-10-CM | POA: Diagnosis not present

## 2015-04-01 DIAGNOSIS — G8194 Hemiplegia, unspecified affecting left nondominant side: Secondary | ICD-10-CM

## 2015-04-01 MED ORDER — DICLOFENAC SODIUM 1 % TD GEL: 2.0000 g | Freq: Four times a day (QID) | TRANSDERMAL | Status: DC

## 2015-04-01 NOTE — Patient Instructions (Signed)
I will send orders to PT and OT over [address] at [facility] neuro rehabilitation so they can see you a couple times a week and work on your left hand weakness as well as your walking and balance.

## 2015-04-01 NOTE — Progress Notes (Signed)
Subjective:    Patient ID: Tara Hopkins, female    DOB: 1950/12/07, 64 y.o.   MRN: [id number] 64 y.o. RH-female with history of HTN, DM, peripheral neuropathy, anxiety disorder; who was admitted via EMS on 02/15/15 with acute onset of left sided weakness and slurred speech.  Upon EMS evaluation BP  220/130 and CBG of 224 and she was treated with IV labetalol. UDS positive for cocaine. CT head negative and tPA administered.  MRI/MRA brain was done revealing acute nonhemorrhagic infarcts in posterior aspect of right lenticular nucleus to posterior corona radiata and posterior limb of L-IC. CTA neck revealed 50% stenosis L-ICA and no large vessel occlusion  HPI  CIR admit Admit date: 02/20/2015 Discharge date: 03/08/2015  Patient states that she has had a couple falls since leaving the hospital, no injuries, states this was more of a slide off her chair. Daughter has been insuring patient is 24 7 supervision post discharge. Some of this supervision is from high school Age grandchildren Patient has follow up with PCP,04/02/2015 Follow-up with neurology is 04/24/2015  Patient has not received home health since discharge from rehabilitation Denies any further cocaine abuse  Pain Inventory Average Pain 8 Pain Right Now 8 My pain is sharp, tingling and aching  In the last 24 hours, has pain interfered with the following? General activity 2 Relation with others 0 Enjoyment of life 1 What TIME of day is your pain at its worst? varies Sleep (in general) Good  Pain is worse with: walking, standing and some activites Pain improves with: rest Relief from Meds: 0  Mobility walk with assistance use a walker how many minutes can you walk? 10 ability to climb steps?  yes do you drive?  no transfers alone  Function disabled: date disabled ? retired I need assistance with the following:  dressing, bathing, meal prep, household duties and shopping  Neuro/Psych trouble walking  Prior  Studies Any changes since last visit?  no  Physicians involved in your care Any changes since last visit?  no   Family History  Problem Relation Age of Onset  . Colon cancer Neg Hx   . Liver cancer Mother   . Lung cancer Mother    History   Social History  . Marital Status: Married    Spouse Name: N/A  . Number of Children: N/A  . Years of Education: N/A   Social History Main Topics  . Smoking status: Current Every Day Smoker -- 0.01 packs/day for 20 years    Types: Cigarettes  . Smokeless tobacco: Never Used  . Alcohol Use: 0.0 oz/week    0 Standard drinks or equivalent per week     Comment: beer every 3 months  . Drug Use: No  . Sexual Activity: Not on file   Other Topics Concern  . None   Social History Narrative   Past Surgical History  Procedure Laterality Date  . I&d extremity Right 05/23/2013    Procedure: IRRIGATION AND DEBRIDEMENT RIGHT GREAT TOE;  Surgeon: Mauri Pole, MD;  Location: [facility];  Service: Orthopedics;  Laterality: Right;  . Amputation Right 12/15/2013    Procedure: Right Transmetatarsal Amputation;  Surgeon: Newt Minion, MD;  Location: [place];  Service: Orthopedics;  Laterality: Right;  . Amputation Right 02/23/2014    Procedure: AMPUTATION FOOT;  Surgeon: Newt Minion, MD;  Location: [place];  Service: Orthopedics;  Laterality: Right;  Right Foot Revision Transmetatarsal Amputation   Past Medical History  Diagnosis Date  .  Diabetes mellitus without complication   . Hypercholesteremia   . Peripheral neuropathy   . Hypertension     not on medications any longer  . Anxiety   . Depression   . History of blood transfusion   . History of hyperbaric oxygen therapy     pt reports 80 treatments.  . Anemia   . Glaucoma    BP 177/92 mmHg  Pulse 81  Resp 14  SpO2 96%  Opioid Risk Score:   Fall Risk Score: Moderate Fall Risk (6-13 points) (patient previously educated)`1  Depression screen PHQ 2/9  No flowsheet data found.   Review of  Systems  Gastrointestinal: Positive for diarrhea.  Endocrine:       High blood sugar  Musculoskeletal: Positive for gait problem.  All other systems reviewed and are negative.      Objective:   Physical Exam  Constitutional: She is oriented to person, place, and time. She appears well-developed and well-nourished.  HENT:  Head: Normocephalic and atraumatic.  Eyes: Conjunctivae and EOM are normal. Pupils are equal, round, and reactive to light.  Neck: Normal range of motion.  Neurological: She is alert and oriented to person, place, and time.  Psychiatric: She has a normal mood and affect.  Nursing note and vitals reviewed. Motor strength is 4/5 in the left deltoid, biceps, triceps, grip, hip flexor, knee extensor 3 minus ankle dorsiflexor   Right-sided strength is 5/5 Same muscle groups There is evidence of dysdiadochokinesis on rapid alternating supination and pronation of the left upper extremity Patient has decreased finger thumb opposition left upper extremity  Musculoskeletal exam, right mid tarsal amputation      Assessment & Plan:  1. 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 Patient with risk factors of hypertension, diabetes and cocaine abuse She still has fine motor deficits as well as some gross motor deficits on the left side. She would benefit from outpatient PT and OT, will send order to [facility] neuro rehabilitation. Recommend continued 24 7 supervision although I do not think this will be a permanent restriction.  Return to clinic 4 weeks  Discussed findings as well as recommendations with both the patient and her daughter. Her daughter was taking notes and was asking questions. Over half of the 25 min visit was spent counseling and coordinating care.

## 2015-04-02 ENCOUNTER — Ambulatory Visit: Payer: Self-pay | Admitting: Adult Health

## 2015-04-02 ENCOUNTER — Other Ambulatory Visit: Payer: Self-pay | Admitting: Family

## 2015-04-03 ENCOUNTER — Ambulatory Visit: Payer: Self-pay | Admitting: Adult Health

## 2015-04-03 DIAGNOSIS — E11622 Type 2 diabetes mellitus with other skin ulcer: Secondary | ICD-10-CM | POA: Diagnosis not present

## 2015-04-03 DIAGNOSIS — Z0289 Encounter for other administrative examinations: Secondary | ICD-10-CM

## 2015-04-24 ENCOUNTER — Ambulatory Visit: Payer: Self-pay | Admitting: Neurology

## 2015-05-03 ENCOUNTER — Ambulatory Visit: Payer: BC Managed Care – PPO | Admitting: Physical Medicine & Rehabilitation

## 2015-05-03 ENCOUNTER — Other Ambulatory Visit: Payer: Self-pay | Admitting: Family

## 2015-05-07 ENCOUNTER — Other Ambulatory Visit: Payer: Self-pay | Admitting: Family

## 2015-05-10 ENCOUNTER — Other Ambulatory Visit: Payer: Self-pay | Admitting: Family

## 2015-05-10 NOTE — Telephone Encounter (Signed)
Scheduled appointment for patient to establish with [facility] prior to authorizing refill. Patient states she will be out of town until first of August and will pick up prescription then. Patient's appointment August 8th.

## 2015-05-10 NOTE — Telephone Encounter (Signed)
Prescription sent in  

## 2015-06-06 ENCOUNTER — Other Ambulatory Visit: Payer: Self-pay | Admitting: Family

## 2015-06-17 ENCOUNTER — Ambulatory Visit: Payer: BC Managed Care – PPO | Admitting: Adult Health

## 2015-06-17 ENCOUNTER — Telehealth: Payer: Self-pay | Admitting: Family

## 2015-06-17 NOTE — Telephone Encounter (Signed)
That is fine 

## 2015-06-17 NOTE — Telephone Encounter (Signed)
Pt states she has no way to get here today, has to ride [facility] bus. Pt states she had a ride today but they called and told her they could not come. Pt states she had her meds filled last week, so next week to resc will be ok.  But this is the 5th cancellation pt has had w. [facility]. pls advise if ok to work in b/c there are no est appts this month.

## 2015-06-19 NOTE — Telephone Encounter (Signed)
Lm on vm for pt.to cb °

## 2015-06-21 ENCOUNTER — Ambulatory Visit: Payer: BC Managed Care – PPO | Admitting: Physical Therapy

## 2015-06-25 ENCOUNTER — Ambulatory Visit: Payer: BC Managed Care – PPO | Attending: Family

## 2015-06-27 ENCOUNTER — Ambulatory Visit: Payer: Self-pay | Admitting: Neurology

## 2015-07-01 ENCOUNTER — Encounter: Payer: Self-pay | Admitting: Neurology

## 2015-07-01 ENCOUNTER — Ambulatory Visit (INDEPENDENT_AMBULATORY_CARE_PROVIDER_SITE_OTHER): Payer: BC Managed Care – PPO | Admitting: Neurology

## 2015-07-01 VITALS — BP 168/82 | HR 72 | Ht 65.0 in | Wt 179.0 lb

## 2015-07-01 DIAGNOSIS — R269 Unspecified abnormalities of gait and mobility: Secondary | ICD-10-CM

## 2015-07-01 DIAGNOSIS — I639 Cerebral infarction, unspecified: Secondary | ICD-10-CM

## 2015-07-01 NOTE — Progress Notes (Signed)
[name] DOB: 1951-01-05  Chief Complaint  Patient presents with  . Cerebrovascular Accident    She is here for a stroke follow up from 02/15/15.  She did not require speech or occupational therapy.  It was recommended she have PT but says she exercises and walks on Tara Hopkins own.  Feels she is back to baseline with the exception of some left-sided residual weakness.     HISTORICAL  Tara Hopkins is a 64 years old female, seen in refer by  Kennyth Arnold, FNP for follow-up of Tara Hopkins stroke in February 15 2015.  She had past medical history of hypertension diabetes hyperlipidemia, presented with sudden onset left arm and leg weakness in February 15 2015, presented to the emergency room, was given IV tPA,  I have personally reviewed MRI of the brain, right lenticular nucleus/posterior radiata infarction, punctuated left internal capsule infarction, likely due to small vessel disease. MRI of the brain showed intracranial atherosclerotic disease, no significant large vessel stenosis CT angiogram of the neck showed 50% stenosis of left internal carotid artery, Ultrasound of carotid artery left side 40-59% stenosis Echocardiogram; no cardiac embolic source Lower extremity Doppler: Negative for DVT  She also had vascular risk factors of longtime smoker, occasionally cocaine use, chronic kidney disease  She was started on aspirin 325 mg, Tara Hopkins A1c was 8.9 upon presentation, history of right toe amputation  REVIEW OF SYSTEMS: Full 14 system review of systems performed and notable only for trouble swallowing, leg swollening, walking difficulty, frequent urination, depression anxiety  ALLERGIES: No Known Allergies  HOME MEDICATIONS: Current Outpatient Prescriptions  Medication Sig Dispense Refill  . acetaminophen (TYLENOL) 500 MG tablet Take 1 tablet (500 mg total) by mouth every 6 (six) hours as needed for headache. 30 tablet 0  . aspirin EC 325 MG EC tablet Take 1 tablet (325 mg total)  by mouth daily. 30 tablet 0  . citalopram (CELEXA) 10 MG tablet Take 1 tablet (10 mg total) by mouth daily. 30 tablet 1  . clonazePAM (KLONOPIN) 0.5 MG tablet take 1 tablet by mouth three times a day if needed 90 tablet 0  . COD LIVER OIL PO Take 1 capsule by mouth daily.    . diclofenac sodium (VOLTAREN) 1 % GEL Apply 2 g topically 4 (four) times daily. 3 Tube 1  . gabapentin (NEURONTIN) 400 MG capsule take 2 capsules by mouth twice a day 120 capsule 4  . glimepiride (AMARYL) 4 MG tablet take 1 tablet once daily 30 tablet 1  . glucose blood test strip Use as instructed 100 each 12  . insulin aspart (NOVOLOG FLEXPEN) 100 UNIT/ML FlexPen Inject 2-6 units into skin 3 times daily (Patient taking differently: Inject 4 Units into the skin 3 (three) times daily as needed for high blood sugar (CBG>130). ) 15 mL 0  . lamoTRIgine (LAMICTAL) 100 MG tablet take 1/2 tablet by mouth twice a day 30 tablet 1  . LANTUS 100 UNIT/ML injection INJECT 30 UNITS AT BEDTIME 10 mL 0  . latanoprost (XALATAN) 0.005 % ophthalmic solution place 1 drop into both eyes at bedtime 2.5 mL 12  . lisinopril (PRINIVIL,ZESTRIL) 10 MG tablet take 1 tablet by mouth once daily 30 tablet 3  . metroNIDAZOLE (FLAGYL) 250 MG tablet Take 1 tablet (250 mg total) by mouth every 8 (eight) hours. 11 tablet 0  . ondansetron (ZOFRAN) 4 MG tablet Take 1 tablet (4 mg total) by mouth every 8 (eight) hours as needed for nausea  or vomiting. 30 tablet 1  . ONE TOUCH LANCETS MISC USE TO CHECK BLOOD SUGAR 3 TIMES DAILY 200 each 3  . polycarbophil (FIBERCON) 625 MG tablet Take 2 tablets (1,250 mg total) by mouth daily. 60 tablet 1  . simvastatin (ZOCOR) 20 MG tablet Take 1 tablet (20 mg total) by mouth at bedtime. 90 tablet 0  . simvastatin (ZOCOR) 20 MG tablet take 1 tablet by mouth at bedtime 30 tablet 1  . white petrolatum (VASELINE) GEL Apply 1 application topically at bedtime. Apply to both feet    . zinc gluconate 50 MG tablet Take 50 mg by mouth 2  (two) times daily.     No current facility-administered medications for this visit.    PAST MEDICAL HISTORY: Past Medical History  Diagnosis Date  . Diabetes mellitus without complication   . Hypercholesteremia   . Peripheral neuropathy   . Hypertension     not on medications any longer  . Anxiety   . Depression   . History of blood transfusion   . History of hyperbaric oxygen therapy     pt reports 80 treatments.  . Anemia   . Glaucoma   . Stroke     PAST SURGICAL HISTORY: Past Surgical History  Procedure Laterality Date  . I&d extremity Right 05/23/2013    Procedure: IRRIGATION AND DEBRIDEMENT RIGHT GREAT TOE;  Surgeon: Mauri Pole, MD;  Location: [facility];  Service: Orthopedics;  Laterality: Right;  . Amputation Right 12/15/2013    Procedure: Right Transmetatarsal Amputation;  Surgeon: Newt Minion, MD;  Location: [place];  Service: Orthopedics;  Laterality: Right;  . Amputation Right 02/23/2014    Procedure: AMPUTATION FOOT;  Surgeon: Newt Minion, MD;  Location: [place];  Service: Orthopedics;  Laterality: Right;  Right Foot Revision Transmetatarsal Amputation    FAMILY HISTORY: Family History  Problem Relation Age of Onset  . Colon cancer Neg Hx   . Liver cancer Mother   . Lung cancer Mother   . Hypertension Father   . Depression Father     SOCIAL HISTORY:  Social History   Social History  . Marital Status: Married    Spouse Name: N/A  . Number of Children: 3  . Years of Education: 14   Occupational History  . Disbaled    Social History Main Topics  . Smoking status: Current Every Day Smoker -- 0.01 packs/day for 20 years    Types: Cigarettes  . Smokeless tobacco: Never Used  . Alcohol Use: 0.0 oz/week    0 Standard drinks or equivalent per week     Comment: beer every 3 months  . Drug Use: No  . Sexual Activity: Not on file   Other Topics Concern  . Not on file   Social History Narrative   Lives at home alone.   Right-handed.   No caffeine  use.     PHYSICAL EXAM   Filed Vitals:   07/01/15 1452  BP: 168/82  Pulse: 72  Height: '5\' 5"'$  (1.651 m)  Weight: 179 lb (81.194 kg)    Not recorded      Body mass index is 29.79 kg/(m^2).  PHYSICAL EXAMNIATION:  Gen: NAD, conversant, well nourised, obese, well groomed                     Cardiovascular: Regular rate rhythm, no peripheral edema, warm, nontender. Eyes: Conjunctivae clear without exudates or hemorrhage Neck: Supple, no carotid bruise. Pulmonary: Clear to auscultation  bilaterally   NEUROLOGICAL EXAM:  MENTAL STATUS: Speech:    Speech is normal; fluent and spontaneous with normal comprehension.  Cognition:     Orientation to time, place and person     Normal recent and remote memory     Normal Attention span and concentration     Normal Language, naming, repeating,spontaneous speech     Fund of knowledge   CRANIAL NERVES: CN II: Visual fields are full to confrontation. Fundoscopic exam is normal with sharp discs and no vascular changes. Pupils are round equal and briskly reactive to light. CN III, IV, VI: extraocular movement are normal. No ptosis. CN V: Facial sensation is intact to pinprick in all 3 divisions bilaterally. Corneal responses are intact.  CN VII: Face is symmetric with normal eye closure and smile. CN VIII: Hearing is normal to rubbing fingers CN IX, X: Palate elevates symmetrically. Phonation is normal. CN XI: Head turning and shoulder shrug are intact CN XII: Tongue is midline with normal movements and no atrophy.  MOTOR: Mild fixation of left upper extremity upon deep rotating movement, mild left hip flexion, left ankle dorsiflexion weakness  REFLEXES: Reflexes are 2+ and symmetric at the biceps, triceps, knees, and ankles. Plantar responses are flexor.  SENSORY: Intact to light touch, pinprick, position sense, and vibration sense are intact in fingers and toes.  COORDINATION: Rapid alternating movements and fine finger  movements are intact. There is no dysmetria on finger-to-nose and heel-knee-shin.    GAIT/STANCE: Mild unsteady gait, dragging Tara Hopkins left leg mildly   DIAGNOSTIC DATA (LABS, IMAGING, TESTING) - I reviewed patient records, labs, notes, testing and imaging myself where available.   ASSESSMENT AND PLAN  Tara Hopkins is a 64 y.o. female    Stroke, small vessel disease, with residual mild left side weakness  Keep aspirin daily  optimize blood pressure control goal blood pressure less than 130/80,   Optimize diabetic control A1c less than 7, LDL less than 70  Continue follow-up with Tara Hopkins primary care physician     Marcial Pacas, M.D. Ph.D.  [facility] [address], [place], [place] [address] Ph: [phone] Fax: [phone]  CC: Kennyth Arnold, FNP

## 2015-07-03 ENCOUNTER — Ambulatory Visit: Payer: BC Managed Care – PPO | Admitting: Adult Health

## 2015-07-04 ENCOUNTER — Other Ambulatory Visit: Payer: Self-pay | Admitting: Family

## 2015-07-09 ENCOUNTER — Other Ambulatory Visit: Payer: Self-pay | Admitting: Family

## 2015-07-16 ENCOUNTER — Ambulatory Visit: Payer: BC Managed Care – PPO | Admitting: Physical Therapy

## 2015-07-16 ENCOUNTER — Telehealth: Payer: Self-pay | Admitting: Family

## 2015-07-16 NOTE — Telephone Encounter (Signed)
Called and spoke with pt and pt is aware.  Pt states she will be here on Thursday.

## 2015-07-16 NOTE — Telephone Encounter (Signed)
I cannot fill this medication for her since I have not seen her, also klonopin is not used for depression.

## 2015-07-16 NOTE — Telephone Encounter (Signed)
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 If symptomatic, describe symptoms. 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 , anxiety attack  Has the patient traveled out of the country within the last 30 days? ---Not Applicable  Does the patient require triage? ---Yes  Related visit to physician within the last 2 weeks? ---No  Does the PT have any chronic conditions? (i.e. diabetes, asthma, etc.) ---Yes  List chronic conditions. ---glaucoma, stroke     Guidelines    Guideline Title Affirmed Question Affirmed Notes  Vision Loss or Change Double vision    Final Disposition User   Go to ED Now (or PCP triage) Leilani Merl, RN, [place]    Referrals  [facility] - ED   Disagree/Comply: Comply

## 2015-07-16 NOTE — Telephone Encounter (Signed)
Pt has appt on thurs, however pt has had a stroke and is going thrugh depression. Also woke up this am and cannot see out of right eye. I will transfer pt to triage for her eye issue, however, pt request refill on  clonazePAM (KLONOPIN) 0.5 MG tablet  [facility] on [address]   Pt states she really needs for the depression and will also make appt w/ dr Glennon Hamilton for therapy.

## 2015-07-17 ENCOUNTER — Emergency Department (HOSPITAL_COMMUNITY)
Admission: EM | Admit: 2015-07-17 | Discharge: 2015-07-17 | Discharge status: Against Medical Advice | Payer: BC Managed Care – PPO | Attending: Emergency Medicine | Admitting: Emergency Medicine

## 2015-07-17 ENCOUNTER — Emergency Department (HOSPITAL_COMMUNITY): Payer: BC Managed Care – PPO

## 2015-07-17 DIAGNOSIS — Z862 Personal history of diseases of the blood and blood-forming organs and certain disorders involving the immune mechanism: Secondary | ICD-10-CM | POA: Diagnosis not present

## 2015-07-17 DIAGNOSIS — Z720 Tobacco use: Secondary | ICD-10-CM | POA: Diagnosis not present

## 2015-07-17 DIAGNOSIS — F329 Major depressive disorder, single episode, unspecified: Secondary | ICD-10-CM | POA: Insufficient documentation

## 2015-07-17 DIAGNOSIS — F419 Anxiety disorder, unspecified: Secondary | ICD-10-CM | POA: Diagnosis not present

## 2015-07-17 DIAGNOSIS — E780 Pure hypercholesterolemia: Secondary | ICD-10-CM | POA: Diagnosis not present

## 2015-07-17 DIAGNOSIS — Z7982 Long term (current) use of aspirin: Secondary | ICD-10-CM | POA: Diagnosis not present

## 2015-07-17 DIAGNOSIS — R531 Weakness: Secondary | ICD-10-CM | POA: Diagnosis present

## 2015-07-17 DIAGNOSIS — G629 Polyneuropathy, unspecified: Secondary | ICD-10-CM | POA: Insufficient documentation

## 2015-07-17 DIAGNOSIS — E119 Type 2 diabetes mellitus without complications: Secondary | ICD-10-CM | POA: Diagnosis not present

## 2015-07-17 DIAGNOSIS — Z9889 Other specified postprocedural states: Secondary | ICD-10-CM | POA: Diagnosis not present

## 2015-07-17 DIAGNOSIS — Z79899 Other long term (current) drug therapy: Secondary | ICD-10-CM | POA: Diagnosis not present

## 2015-07-17 DIAGNOSIS — H409 Unspecified glaucoma: Secondary | ICD-10-CM | POA: Diagnosis not present

## 2015-07-17 DIAGNOSIS — H538 Other visual disturbances: Secondary | ICD-10-CM | POA: Diagnosis not present

## 2015-07-17 DIAGNOSIS — I10 Essential (primary) hypertension: Secondary | ICD-10-CM | POA: Diagnosis not present

## 2015-07-17 DIAGNOSIS — Z8673 Personal history of transient ischemic attack (TIA), and cerebral infarction without residual deficits: Secondary | ICD-10-CM | POA: Diagnosis not present

## 2015-07-17 LAB — COMPREHENSIVE METABOLIC PANEL
ALT: 15 U/L (ref 14–54)
AST: 20 U/L (ref 15–41)
Albumin: 3.7 g/dL (ref 3.5–5.0)
Alkaline Phosphatase: 75 U/L (ref 38–126)
Anion gap: 9 (ref 5–15)
BUN: 28 mg/dL — AB (ref 6–20)
CHLORIDE: 104 mmol/L (ref 101–111)
CO2: 25 mmol/L (ref 22–32)
Calcium: 9 mg/dL (ref 8.9–10.3)
Creatinine, Ser: 1.73 mg/dL — ABNORMAL HIGH (ref 0.44–1.00)
GFR calc Af Amer: 35 mL/min — ABNORMAL LOW (ref >60)
GFR, EST NON AFRICAN AMERICAN: 30 mL/min — AB (ref >60)
GLUCOSE: 208 mg/dL — AB (ref 65–99)
POTASSIUM: 4.6 mmol/L (ref 3.5–5.1)
SODIUM: 138 mmol/L (ref 135–145)
Total Bilirubin: 0.1 mg/dL — ABNORMAL LOW (ref 0.3–1.2)
Total Protein: 7 g/dL (ref 6.5–8.1)

## 2015-07-17 LAB — URINALYSIS, ROUTINE W REFLEX MICROSCOPIC
Bilirubin Urine: NEGATIVE
GLUCOSE, UA: NEGATIVE mg/dL
Hgb urine dipstick: NEGATIVE
Ketones, ur: NEGATIVE mg/dL
LEUKOCYTES UA: NEGATIVE
Nitrite: NEGATIVE
PROTEIN: 100 mg/dL — AB
Specific Gravity, Urine: 1.013 (ref 1.005–1.030)
Urobilinogen, UA: 0.2 mg/dL (ref 0.0–1.0)
pH: 5.5 (ref 5.0–8.0)

## 2015-07-17 LAB — CBC WITH DIFFERENTIAL/PLATELET
BASOS PCT: 0 % (ref 0–1)
Basophils Absolute: 0 10*3/uL (ref 0.0–0.1)
EOS ABS: 0.2 10*3/uL (ref 0.0–0.7)
Eosinophils Relative: 2 % (ref 0–5)
HCT: 37.7 % (ref 36.0–46.0)
Hemoglobin: 11.9 g/dL — ABNORMAL LOW (ref 12.0–15.0)
Lymphocytes Relative: 31 % (ref 12–46)
Lymphs Abs: 2.4 10*3/uL (ref 0.7–4.0)
MCH: 29 pg (ref 26.0–34.0)
MCHC: 31.6 g/dL (ref 30.0–36.0)
MCV: 92 fL (ref 78.0–100.0)
MONO ABS: 0.4 10*3/uL (ref 0.1–1.0)
Monocytes Relative: 5 % (ref 3–12)
Neutro Abs: 4.7 10*3/uL (ref 1.7–7.7)
Neutrophils Relative %: 62 % (ref 43–77)
PLATELETS: 318 10*3/uL (ref 150–400)
RBC: 4.1 MIL/uL (ref 3.87–5.11)
RDW: 13.5 % (ref 11.5–15.5)
WBC: 7.7 10*3/uL (ref 4.0–10.5)

## 2015-07-17 LAB — URINE MICROSCOPIC-ADD ON

## 2015-07-17 IMAGING — CT CT HEAD W/O CM
2 series · 16 of 30 positions shown, 19 images · non-contrast
Comparison: Head CT [DATE]; brain MRI [DATE]

CLINICAL DATA: Three-day history of left lower extremity weakness.
One day history of blurred vision left eye. Several recent falls

EXAM:
CT HEAD WITHOUT CONTRAST
TECHNIQUE: Contiguous axial images were obtained from the base of the skull
through the vertex without intravenous contrast.

[Series 2: head w/o · axial · non-contrast · 0.45mm/px · z∈[+1396,+1516]mm · 9 of 30 slices shown, 12 images]
[im 3/30  brain]
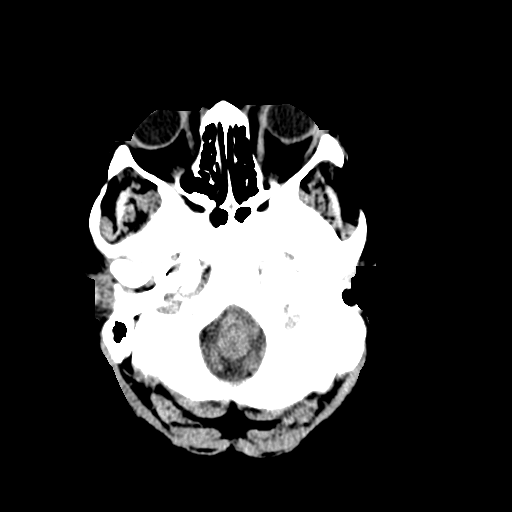
[im 3/30  bone]
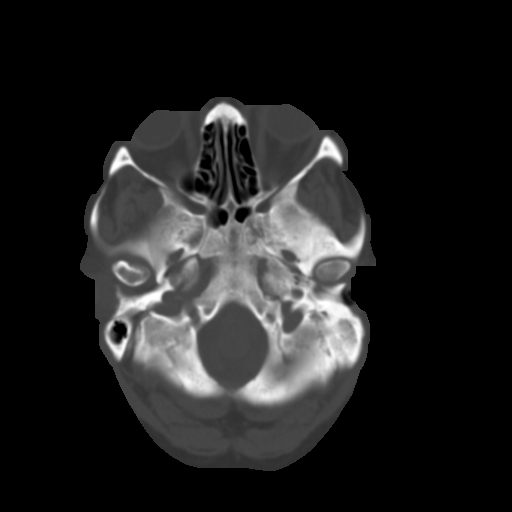
[im 6/30  brain]
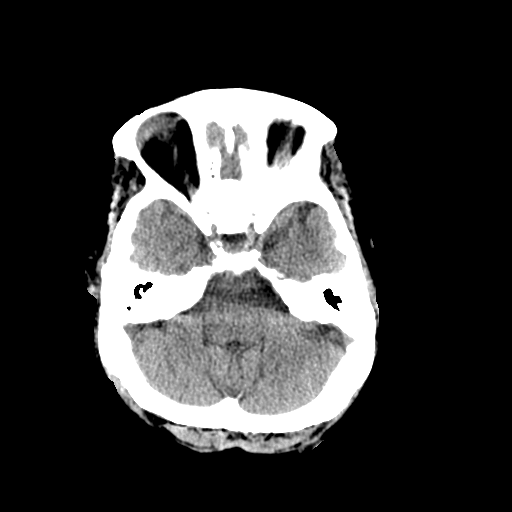
[im 9/30  brain]
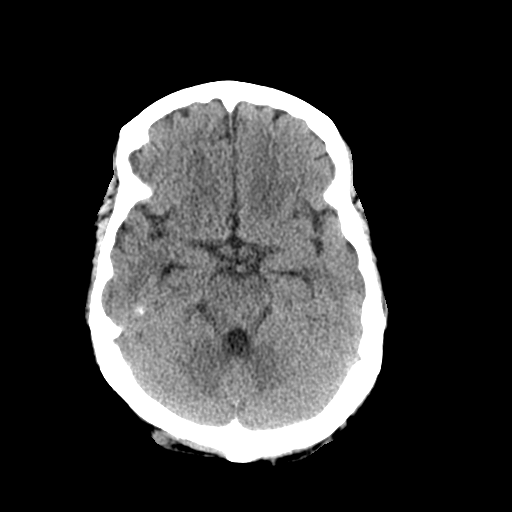
[im 12/30  brain]
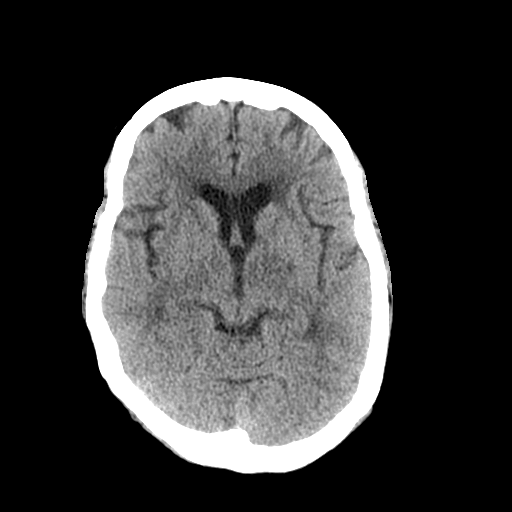
[im 15/30  brain]
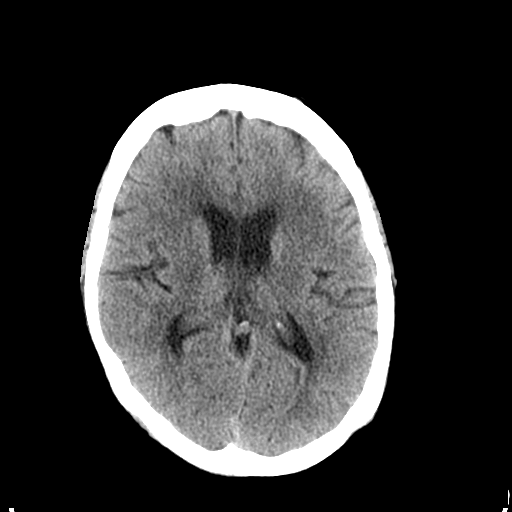
[im 15/30  bone]
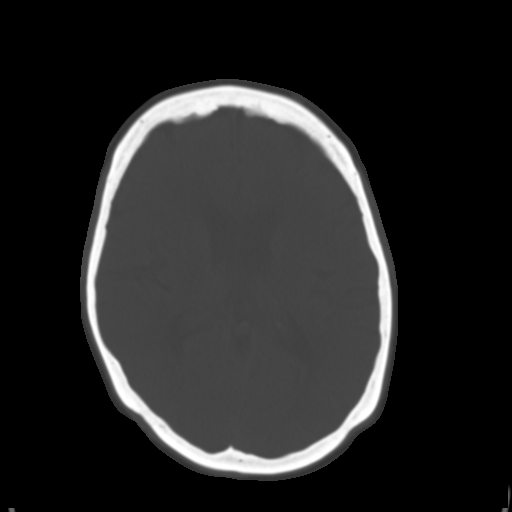
[im 18/30  brain]
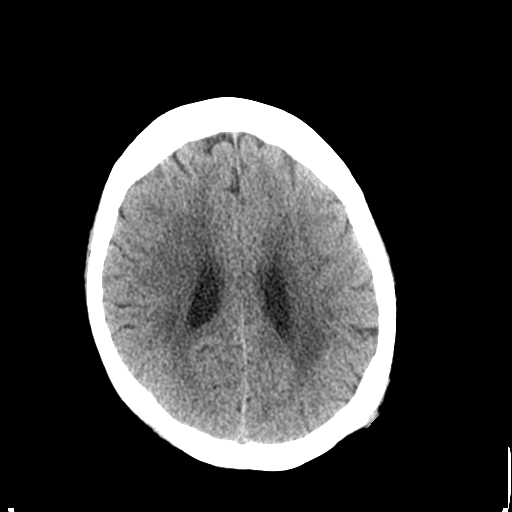
[im 21/30  brain]
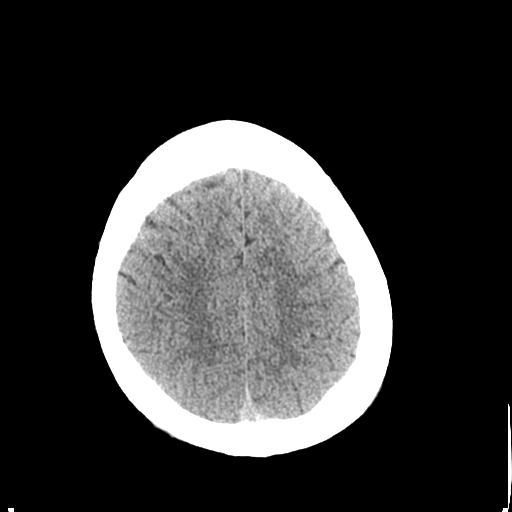
[im 24/30  brain]
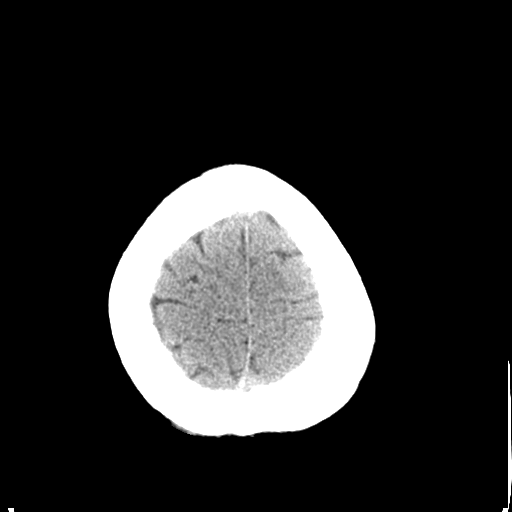
[im 27/30  brain]
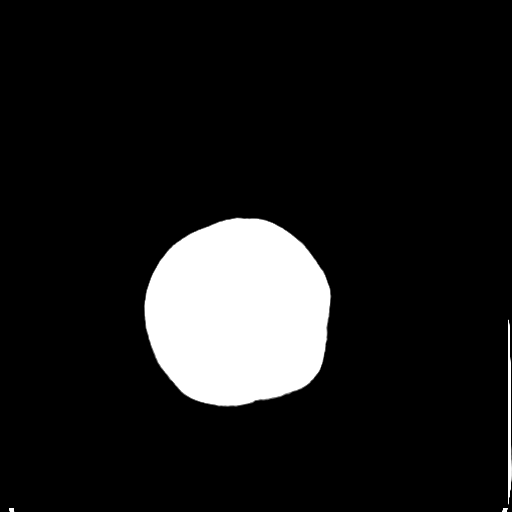
[im 27/30  bone]
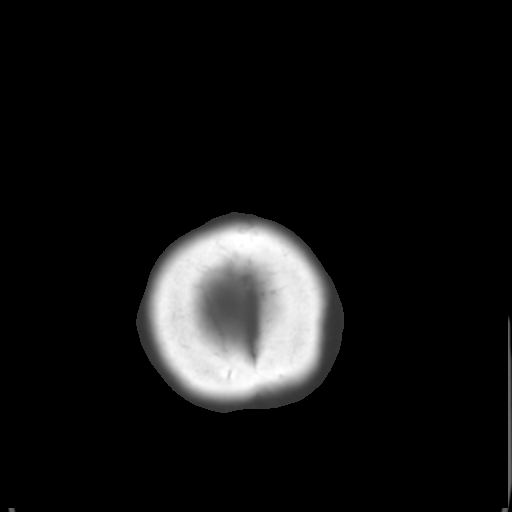

[Series 3: bone windows · axial · 0.45mm/px · z∈[+1401,+1500]mm · 7 of 50 slices shown]
[im 6/50  bone]
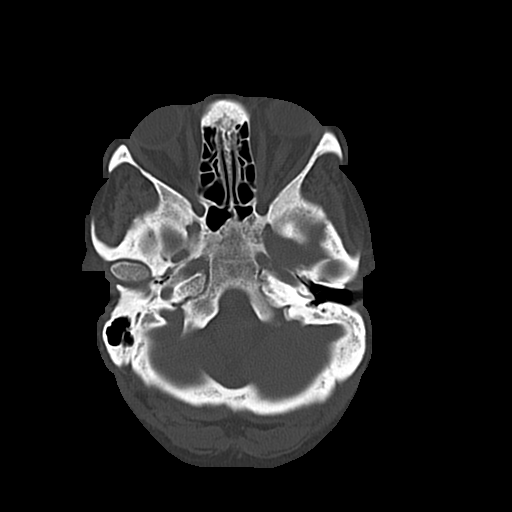
[im 11/50  bone]
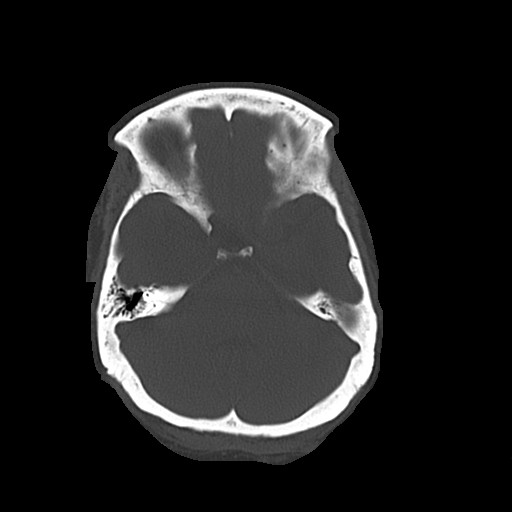
[im 17/50  bone]
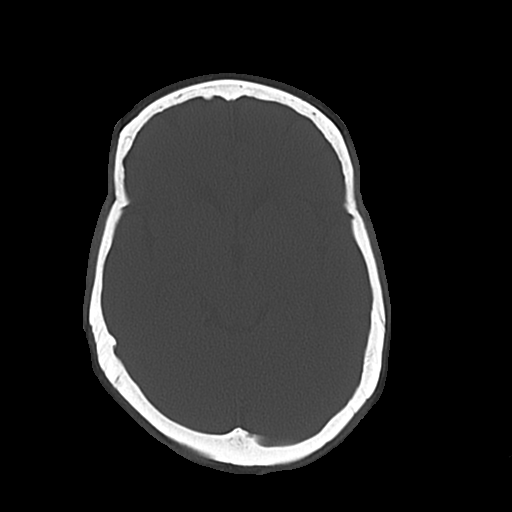
[im 22/50  bone]
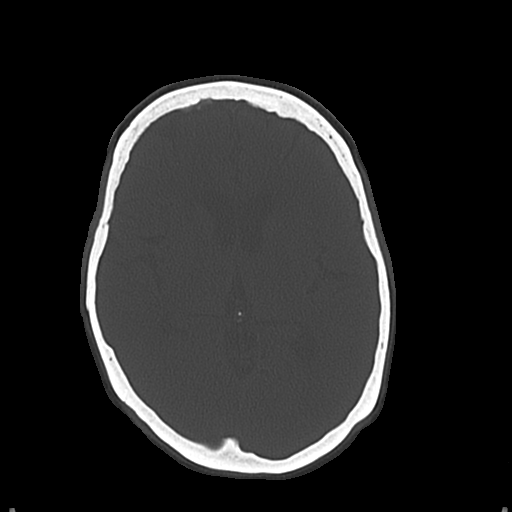
[im 28/50  bone]
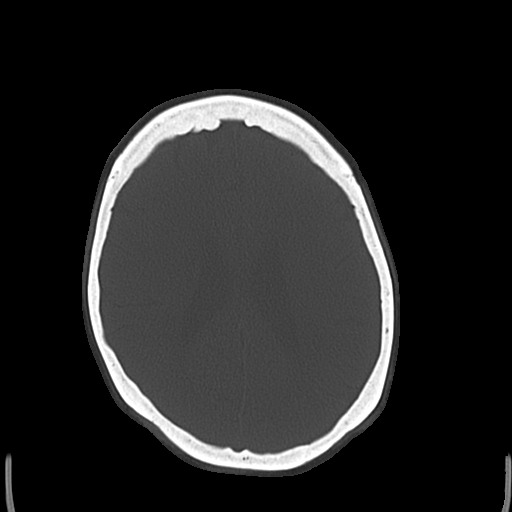
[im 33/50  bone]
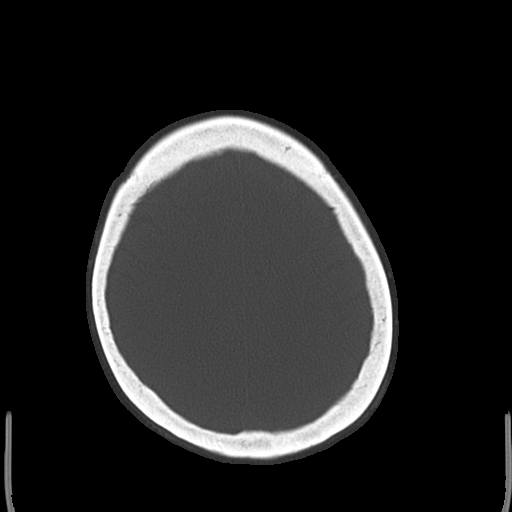
[im 39/50  bone]
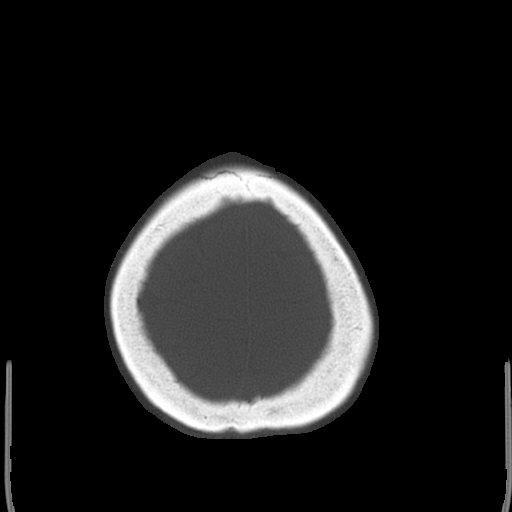

[16 of 30 positions shown; findings below may reference images not displayed]

FINDINGS: There is stable mild diffuse atrophy. There is no intracranial mass,
hemorrhage, extra-axial fluid collection, or midline shift. There is
patchy small vessel disease in the centra semiovale bilaterally.
Prior infarct in the superior right centrum semiovale is present. In
comparison with prior studies, there is no new gray-white
compartment lesion. No acute infarct evident. Bony calvarium appears
intact. Mastoid air cells are clear.
IMPRESSION: Atrophy with small vessel disease in the periventricular white
matter, stable. Prior infarct right superior centrum semiovale
stable. No acute infarct evident. No hemorrhage or mass-effect.

## 2015-07-17 NOTE — ED Provider Notes (Signed)
CSN: [id number]     Arrival date & time 07/17/15  0846 History   First MD Initiated Contact with Patient 07/17/15 [phone]     Chief Complaint  Patient presents with  . Blurred Vision  . weakness, hx stroke      (Consider location/radiation/quality/duration/timing/severity/associated sxs/prior Treatment) HPI Comments: Pt is a 64 yo female with history of stroke (04/2015) who presents to the ED with complaint of left sided weakness, onset 3 days. Pt reports that she woke up 3 days ago and noticed weakness to her left arm and leg. Pt states she has weakness to her left arm and leg since her stroke at baseline but does not require a walker or cane for ambulation. She notes she has had 4 falls since due to the weakness. Denies head injury or LOC. She also reports having blurred vision to her left eye that started yesterday. She notes she has a history of glaucoma and has been compliant with her medication. Denies fever, headache, lightheaded, dizzy, slurred speech, sore throat, SOB, CP, palpitations, abdominal pain, N/V/D, urinary sxs, blood in stool or urine, numbness, tingling. Pt reports taking ASA since CVA.    Past Medical History  Diagnosis Date  . Diabetes mellitus without complication   . Hypercholesteremia   . Peripheral neuropathy   . Hypertension     not on medications any longer  . Anxiety   . Depression   . History of blood transfusion   . History of hyperbaric oxygen therapy     pt reports 80 treatments.  . Anemia   . Glaucoma   . Stroke    Past Surgical History  Procedure Laterality Date  . I&d extremity Right 05/23/2013    Procedure: IRRIGATION AND DEBRIDEMENT RIGHT GREAT TOE;  Surgeon: Mauri Pole, MD;  Location: [facility];  Service: Orthopedics;  Laterality: Right;  . Amputation Right 12/15/2013    Procedure: Right Transmetatarsal Amputation;  Surgeon: Newt Minion, MD;  Location: [facility];  Service: Orthopedics;  Laterality: Right;  . Amputation Right 02/23/2014    Procedure:  AMPUTATION FOOT;  Surgeon: Newt Minion, MD;  Location: [facility];  Service: Orthopedics;  Laterality: Right;  Right Foot Revision Transmetatarsal Amputation   Family History  Problem Relation Age of Onset  . Colon cancer Neg Hx   . Liver cancer Mother   . Lung cancer Mother   . Hypertension Father   . Depression Father    Social History  Substance Use Topics  . Smoking status: Current Every Day Smoker -- 0.01 packs/day for 20 years    Types: Cigarettes  . Smokeless tobacco: Never Used  . Alcohol Use: 0.0 oz/week    0 Standard drinks or equivalent per week     Comment: beer every 3 months   OB History    No data available     Review of Systems  Eyes: Positive for visual disturbance.  Neurological: Positive for weakness.  All other systems reviewed and are negative.     Allergies  Review of patient's allergies indicates no known allergies.  Home Medications   Prior to Admission medications   Medication Sig Start Date End Date Taking? Authorizing Provider  acetaminophen (TYLENOL) 500 MG tablet Take 1 tablet (500 mg total) by mouth every 6 (six) hours as needed for headache. 03/08/15  Yes Ivan Anchors Love, PA-C  aspirin EC 325 MG EC tablet Take 1 tablet (325 mg total) by mouth daily. 03/08/15  Yes Bary Leriche, PA-C  clonazePAM (KLONOPIN) 0.5 MG tablet take 1 tablet by mouth three times a day if needed Patient taking differently: Take 0.5 MG by mouth three times daily as needed for anxiety 05/10/15  Yes Kennyth Arnold, FNP  COD LIVER OIL PO Take 1 capsule by mouth daily.   Yes Historical Provider, MD  gabapentin (NEURONTIN) 400 MG capsule take 2 capsules by mouth twice a day Patient taking differently: Take 800 mg by mouth twice daily 04/02/15  Yes Kennyth Arnold, FNP  glimepiride (AMARYL) 4 MG tablet take 1 tablet once daily Patient taking differently: Take 4 mg by mouth once daily 06/06/15  Yes Kennyth Arnold, FNP  glucose blood test strip Use as instructed 06/01/14  Yes Kennyth Arnold, FNP  insulin aspart (NOVOLOG FLEXPEN) 100 UNIT/ML FlexPen Inject 2-6 units into skin 3 times daily Patient taking differently: Inject 4 Units into the skin 3 (three) times daily as needed for high blood sugar (CBG>130).  09/28/14  Yes Kennyth Arnold, FNP  lamoTRIgine (LAMICTAL) 100 MG tablet take 1/2 tablet by mouth twice a day Patient taking differently: Take 50 MG by mouth twice daily 07/11/15  Yes [name], NP  LANTUS 100 UNIT/ML injection INJECT 30 UNITS AT BEDTIME 05/03/15  Yes Kennyth Arnold, FNP  latanoprost (XALATAN) 0.005 % ophthalmic solution place 1 drop into both eyes at bedtime 08/06/14  Yes Kennyth Arnold, FNP  lisinopril (PRINIVIL,ZESTRIL) 10 MG tablet take 1 tablet by mouth once daily Patient taking differently: Take 10 MG by mouth once daily 05/07/15  Yes Kennyth Arnold, FNP  ONE TOUCH LANCETS MISC USE TO CHECK BLOOD SUGAR 3 TIMES DAILY 06/01/14  Yes Kennyth Arnold, FNP  simvastatin (ZOCOR) 20 MG tablet Take 1 tablet (20 mg total) by mouth at bedtime. 09/28/14  Yes Kennyth Arnold, FNP  white petrolatum (VASELINE) GEL Apply 1 application topically at bedtime. Apply to both feet   Yes Historical Provider, MD  zinc gluconate 50 MG tablet Take 50 mg by mouth 2 (two) times daily.   Yes Historical Provider, MD  citalopram (CELEXA) 10 MG tablet Take 1 tablet (10 mg total) by mouth daily. Patient not taking: Reported on 07/17/2015 03/08/15   Ivan Anchors Love, PA-C  diclofenac sodium (VOLTAREN) 1 % GEL Apply 2 g topically 4 (four) times daily. Patient not taking: Reported on 07/17/2015 04/01/15   Charlett Blake, MD  metroNIDAZOLE (FLAGYL) 250 MG tablet Take 1 tablet (250 mg total) by mouth every 8 (eight) hours. Patient not taking: Reported on 07/17/2015 03/08/15   Ivan Anchors Love, PA-C  ondansetron (ZOFRAN) 4 MG tablet Take 1 tablet (4 mg total) by mouth every 8 (eight) hours as needed for nausea or vomiting. Patient not taking: Reported on 07/17/2015 03/08/15   Ivan Anchors Love, PA-C   polycarbophil (FIBERCON) 625 MG tablet Take 2 tablets (1,250 mg total) by mouth daily. Patient not taking: Reported on 07/17/2015 03/08/15   Ivan Anchors Love, PA-C  simvastatin (ZOCOR) 20 MG tablet take 1 tablet by mouth at bedtime Patient not taking: Reported on 07/17/2015 06/06/15   Kennyth Arnold, FNP   There were no vitals taken for this visit. Physical Exam  Constitutional: She is oriented to person, place, and time. She appears well-developed and well-nourished.  HENT:  Head: Normocephalic and atraumatic.  Eyes: Conjunctivae are normal. Pupils are equal, round, and reactive to light. Right eye exhibits no discharge. Left eye exhibits no discharge. No scleral icterus. Right eye exhibits  abnormal extraocular motion. Right eye exhibits no nystagmus. Left eye exhibits normal extraocular motion and no nystagmus.  Right eye unable to gaze medially.  Neck: Normal range of motion. Neck supple.  Cardiovascular: Normal rate, regular rhythm, normal heart sounds and intact distal pulses.   Pulmonary/Chest: Effort normal and breath sounds normal. She has no wheezes. She has no rales. She exhibits no tenderness.  Abdominal: Soft. Bowel sounds are normal. She exhibits no mass. There is no tenderness. There is no rebound and no guarding.  Musculoskeletal: Normal range of motion. She exhibits no edema or tenderness.  Partial amputation of right foot.   Lymphadenopathy:    She has no cervical adenopathy.  Neurological: She is alert and oriented to person, place, and time. She has normal strength. A cranial nerve deficit is present. No sensory deficit. She displays a negative Romberg sign. Coordination normal.  Skin: Skin is warm and dry.  Nursing note and vitals reviewed.   ED Course  Procedures (including critical care time) Labs Review Labs Reviewed  CBC WITH DIFFERENTIAL/PLATELET  COMPREHENSIVE METABOLIC PANEL    Imaging Review No results found. I have personally reviewed and evaluated these  images and lab results as part of my medical decision-making.  Filed Vitals:   07/17/15 1148  BP: 158/74  Pulse: 77  Temp: 97.5 F (36.4 C)  Resp: 14     MDM   Final diagnoses:  Weakness  Blurred vision, left eye    Pt presents with weakness to her left arm and leg and blurred vision in her left eye. History of glaucoma and CVA (reported left sided weakness at baseline s/p stroke). Recent history of falls with no LOC or head injury. VSS. CNs intact with exception of right eye unable to gaze medially on exam (pt reports her right eye is unchanged). 5/5 strength in upper and lower extremities, sensation intact, negative romberg.   Labs unremarkable. CT head revealed atrophy with small vessel disease in the periventricular white matter, stable. Prior infarct right superior centrum semiovale stable. No acute infarct evident. No hemorrhage or mass-effect. Pt tried to stand and walk to use the restroom but was unable to ambulate without assistance from two nurses due to bilateral weakness.   Discussed with pt plan for admission, pt reports she does not want to be admitted and wants to go home. I had multiple discussions with pt regarding her condition and explained how I think she is a fall risk due to her weakness and unsteady gait. Pt understands and acknowledges my advise but continues to refuse admission and reports that she has her children at home who help take care of her and her daughter is trying to set up home health care for her to start next week.     Chesley Noon [place], [place] 07/17/15 [place], MD 07/18/15 [phone]

## 2015-07-17 NOTE — ED Notes (Signed)
Per patient, she does not wish to be admitted in the hospital.  She sad her daughter is going to get her a home nurse.

## 2015-07-17 NOTE — Telephone Encounter (Signed)
Patient checked into ED 

## 2015-07-17 NOTE — ED Notes (Signed)
Pt reports she had a stroke 8 weeks ago, now has left sided weakness. Reports increased left leg weakness for 3 days, left eye blurry vision starting yesterday. pts left eye lags during visual eye test. Hx of glaucoma, complaint with meds. Pt fell 4x in last week. Denies hitting head.   Marijuana use 2 week ago.

## 2015-07-17 NOTE — ED Notes (Signed)
This RN went into room to check on patient left hospital without signing consent form.  Tara Hopkins was found on floor.  She left hospital with family member.

## 2015-07-17 NOTE — ED Notes (Signed)
Patient transported to CT 

## 2015-07-18 ENCOUNTER — Encounter: Payer: Self-pay | Admitting: Family

## 2015-07-18 ENCOUNTER — Ambulatory Visit: Payer: BC Managed Care – PPO | Admitting: Adult Health

## 2015-07-23 ENCOUNTER — Ambulatory Visit: Payer: BC Managed Care – PPO | Admitting: Adult Health

## 2015-08-08 ENCOUNTER — Other Ambulatory Visit: Payer: Self-pay | Admitting: Family

## 2015-08-08 ENCOUNTER — Telehealth: Payer: Self-pay | Admitting: Family

## 2015-08-08 NOTE — Telephone Encounter (Signed)
Pt scheduled and aware

## 2015-08-08 NOTE — Telephone Encounter (Signed)
[name] from [facility] [phone])called needing a medical clearance signed for Tara Hopkins to have cataract surgery 08/12/2015. Without clearance they will have to cancel her appointment. This clearance is due to [name] having a stoke in April 2016.

## 2015-08-08 NOTE — Telephone Encounter (Signed)
Tara Hopkins has agreed to see patient tomorrow. Information handed to Tara Hopkins to make contact with patient to schedule appointment.

## 2015-08-09 ENCOUNTER — Other Ambulatory Visit: Payer: Self-pay | Admitting: Adult Health

## 2015-08-09 ENCOUNTER — Ambulatory Visit (INDEPENDENT_AMBULATORY_CARE_PROVIDER_SITE_OTHER): Payer: BC Managed Care – PPO | Admitting: Adult Health

## 2015-08-09 ENCOUNTER — Encounter: Payer: Self-pay | Admitting: Adult Health

## 2015-08-09 ENCOUNTER — Telehealth: Payer: Self-pay | Admitting: Adult Health

## 2015-08-09 VITALS — BP 150/82 | HR 81 | Temp 98.0°F | Ht 65.0 in | Wt 181.6 lb

## 2015-08-09 DIAGNOSIS — R9431 Abnormal electrocardiogram [ECG] [EKG]: Secondary | ICD-10-CM

## 2015-08-09 DIAGNOSIS — I10 Essential (primary) hypertension: Secondary | ICD-10-CM

## 2015-08-09 DIAGNOSIS — Z01818 Encounter for other preprocedural examination: Secondary | ICD-10-CM | POA: Diagnosis not present

## 2015-08-09 LAB — CBC WITH DIFFERENTIAL/PLATELET
BASOS PCT: 0.6 % (ref 0.0–3.0)
Basophils Absolute: 0 10*3/uL (ref 0.0–0.1)
Eosinophils Absolute: 0.1 10*3/uL (ref 0.0–0.7)
Eosinophils Relative: 1.5 % (ref 0.0–5.0)
HCT: 38.7 % (ref 36.0–46.0)
Hemoglobin: 12.7 g/dL (ref 12.0–15.0)
Lymphocytes Relative: 31.3 % (ref 12.0–46.0)
Lymphs Abs: 2.7 10*3/uL (ref 0.7–4.0)
MCHC: 32.7 g/dL (ref 30.0–36.0)
MCV: 88.4 fl (ref 78.0–100.0)
MONO ABS: 0.4 10*3/uL (ref 0.1–1.0)
Monocytes Relative: 5 % (ref 3.0–12.0)
Neutro Abs: 5.3 10*3/uL (ref 1.4–7.7)
Neutrophils Relative %: 61.6 % (ref 43.0–77.0)
PLATELETS: 356 10*3/uL (ref 150.0–400.0)
RBC: 4.38 Mil/uL (ref 3.87–5.11)
RDW: 14.2 % (ref 11.5–15.5)
WBC: 8.6 10*3/uL (ref 4.0–10.5)

## 2015-08-09 LAB — HEMOGLOBIN A1C: Hgb A1c MFr Bld: 10.2 % — ABNORMAL HIGH (ref 4.6–6.5)

## 2015-08-09 LAB — BASIC METABOLIC PANEL
BUN: 22 mg/dL (ref 6–23)
CO2: 31 mEq/L (ref 19–32)
Calcium: 9.8 mg/dL (ref 8.4–10.5)
Chloride: 102 mEq/L (ref 96–112)
Creatinine, Ser: 1.57 mg/dL — ABNORMAL HIGH (ref 0.40–1.20)
GFR: 42.77 mL/min — AB (ref >60.00)
Glucose, Bld: 229 mg/dL — ABNORMAL HIGH (ref 70–99)
Potassium: 4.9 mEq/L (ref 3.5–5.1)
Sodium: 140 mEq/L (ref 135–145)

## 2015-08-09 NOTE — Progress Notes (Signed)
Pre visit review using our clinic review tool, if applicable. No additional management support is needed unless otherwise documented below in the visit note. 

## 2015-08-09 NOTE — Progress Notes (Signed)
Subjective:    Patient ID: Tara Hopkins, female    DOB: 1951-07-06, 64 y.o.   MRN: [id number]  HPI  64 year old female who presents to the office today for pre surgical clearance. She is a patient of NP Justin Mend, has cancelled numerous times when she was supposed to establish with me. This is the first time I have met her.   Her most recent healthy history is that of a stroke in April 2016. She has left sided weakness but is able to walk with shuffling gait.   She is having left sided cataract surgery on Monday.   Review of Systems  Constitutional: Negative.   HENT: Negative.   Respiratory: Negative.   Cardiovascular: Negative.   Musculoskeletal: Positive for gait problem.  Neurological: Negative.   All other systems reviewed and are negative.  Past Medical History  Diagnosis Date  . Diabetes mellitus without complication   . Hypercholesteremia   . Peripheral neuropathy   . Hypertension     not on medications any longer  . Anxiety   . Depression   . History of blood transfusion   . History of hyperbaric oxygen therapy     pt reports 80 treatments.  . Anemia   . Glaucoma   . Stroke     Social History   Social History  . Marital Status: Married    Spouse Name: N/A  . Number of Children: 3  . Years of Education: 14   Occupational History  . Disbaled    Social History Main Topics  . Smoking status: Current Every Day Smoker -- 0.01 packs/day for 20 years    Types: Cigarettes  . Smokeless tobacco: Never Used  . Alcohol Use: 0.0 oz/week    0 Standard drinks or equivalent per week     Comment: beer every 3 months  . Drug Use: No  . Sexual Activity: Not on file   Other Topics Concern  . Not on file   Social History Narrative   Lives at home alone.   Right-handed.   No caffeine use.    Past Surgical History  Procedure Laterality Date  . I&d extremity Right 05/23/2013    Procedure: IRRIGATION AND DEBRIDEMENT RIGHT GREAT TOE;  Surgeon: Mauri Pole, MD;   Location: [facility];  Service: Orthopedics;  Laterality: Right;  . Amputation Right 12/15/2013    Procedure: Right Transmetatarsal Amputation;  Surgeon: Newt Minion, MD;  Location: [facility];  Service: Orthopedics;  Laterality: Right;  . Amputation Right 02/23/2014    Procedure: AMPUTATION FOOT;  Surgeon: Newt Minion, MD;  Location: [facility];  Service: Orthopedics;  Laterality: Right;  Right Foot Revision Transmetatarsal Amputation    Family History  Problem Relation Age of Onset  . Colon cancer Neg Hx   . Liver cancer Mother   . Lung cancer Mother   . Hypertension Father   . Depression Father     No Known Allergies  Current Outpatient Prescriptions on File Prior to Visit  Medication Sig Dispense Refill  . aspirin EC 325 MG EC tablet Take 1 tablet (325 mg total) by mouth daily. 30 tablet 0  . COD LIVER OIL PO Take 1 capsule by mouth daily.    Marland Kitchen gabapentin (NEURONTIN) 400 MG capsule take 2 capsules by mouth twice a day (Patient taking differently: Take 800 mg by mouth twice daily) 120 capsule 4  . glimepiride (AMARYL) 4 MG tablet take 1 tablet by mouth once daily 30 tablet  1  . glucose blood test strip Use as instructed 100 each 12  . insulin aspart (NOVOLOG FLEXPEN) 100 UNIT/ML FlexPen Inject 2-6 units into skin 3 times daily (Patient taking differently: Inject 4 Units into the skin 3 (three) times daily as needed for high blood sugar (CBG>130). ) 15 mL 0  . lamoTRIgine (LAMICTAL) 100 MG tablet take 1/2 tablet by mouth twice a day (Patient taking differently: Take 50 MG by mouth twice daily) 30 tablet 0  . LANTUS 100 UNIT/ML injection INJECT 30 UNITS AT BEDTIME 10 mL 0  . latanoprost (XALATAN) 0.005 % ophthalmic solution place 1 drop into both eyes at bedtime 2.5 mL 12  . lisinopril (PRINIVIL,ZESTRIL) 10 MG tablet take 1 tablet by mouth once daily (Patient taking differently: Take 10 MG by mouth once daily) 30 tablet 3  . ONE TOUCH LANCETS MISC USE TO CHECK BLOOD SUGAR 3 TIMES DAILY 200 each 3    . simvastatin (ZOCOR) 20 MG tablet Take 1 tablet (20 mg total) by mouth at bedtime. 90 tablet 0  . simvastatin (ZOCOR) 20 MG tablet take 1 tablet by mouth at bedtime 30 tablet 1  . white petrolatum (VASELINE) GEL Apply 1 application topically at bedtime. Apply to both feet    . zinc gluconate 50 MG tablet Take 50 mg by mouth 2 (two) times daily.    . citalopram (CELEXA) 10 MG tablet Take 1 tablet (10 mg total) by mouth daily. (Patient not taking: Reported on 08/09/2015) 30 tablet 1  . clonazePAM (KLONOPIN) 0.5 MG tablet take 1 tablet by mouth three times a day if needed (Patient not taking: Reported on 08/09/2015) 90 tablet 0  . ondansetron (ZOFRAN) 4 MG tablet Take 1 tablet (4 mg total) by mouth every 8 (eight) hours as needed for nausea or vomiting. (Patient not taking: Reported on 07/17/2015) 30 tablet 1   No current facility-administered medications on file prior to visit.    BP 170/110 mmHg  Pulse 81  Temp(Src) 98 F (36.7 C) (Oral)  Ht 5' 5"  (1.651 m)  Wt 181 lb 9.6 oz (82.373 kg)  BMI 30.22 kg/m2  SpO2 96%       Objective:   Physical Exam  Constitutional: She is oriented to person, place, and time. She appears well-developed and well-nourished. No distress.  HENT:  Head: Normocephalic and atraumatic.  Right Ear: External ear normal.  Left Ear: External ear normal.  Nose: Nose normal.  Mouth/Throat: Oropharynx is clear and moist. No oropharyngeal exudate.  Eyes: Conjunctivae and EOM are normal. Pupils are equal, round, and reactive to light. Right eye exhibits no discharge. Left eye exhibits no discharge.  Cardiovascular: Normal rate, regular rhythm, normal heart sounds and intact distal pulses.  Exam reveals no gallop and no friction rub.   No murmur heard. Pulmonary/Chest: Effort normal and breath sounds normal. No respiratory distress. She has no wheezes. She has no rales. She exhibits no tenderness.  Abdominal: Soft. Bowel sounds are normal. She exhibits no distension and  no mass. There is no tenderness. There is no guarding.  Musculoskeletal: She exhibits no edema or tenderness.  Shuffling but steady gait  Neurological: She is alert and oriented to person, place, and time. She has normal reflexes.  Skin: Skin is warm and dry. No rash noted. She is not diaphoretic. No erythema. No pallor.  Psychiatric: She has a normal mood and affect. Her behavior is normal. Judgment and thought content normal.  Nursing note and vitals reviewed.  Assessment & Plan:  1. Preoperative clearance - EKG 12-Lead- Sinus Rhythm, T-wave abnormality, rate 69 - Hemoglobin K7R - Basic metabolic panel - CBC with Differential/Platelet - She will be cleared for cataract surgery if she can keep her BP under control  2. Essential hypertension - Did not take BP medication before coming to the office. BP 170/110 in the office. She took her blood pressure medication in the office. 150/82 prior to discharge    - She understands that she needs to follow up with me to establish care for me to see her for anything else. She has cancelled numerous times and it was explained to the patient that if she cancels her next appointment then she will be discharged from the practice.

## 2015-08-09 NOTE — Patient Instructions (Signed)
You will be cleared for surgery if you can keep your blood pressure under control.   I will follow up with you regarding your blood work.   Make an appointment to follow up with me to establish care

## 2015-08-09 NOTE — Telephone Encounter (Signed)
Spoke with patient and informed her of her labs. Advised to make sure she is taking her medication for diabetes since her A1c is elevated.

## 2015-09-02 ENCOUNTER — Other Ambulatory Visit: Payer: Self-pay | Admitting: Family

## 2015-09-04 NOTE — Telephone Encounter (Signed)
Rx's done. Pt is scheduled to see [facility] 10/31.

## 2015-09-09 ENCOUNTER — Ambulatory Visit (INDEPENDENT_AMBULATORY_CARE_PROVIDER_SITE_OTHER): Payer: BC Managed Care – PPO | Admitting: Adult Health

## 2015-09-09 ENCOUNTER — Encounter: Payer: Self-pay | Admitting: Adult Health

## 2015-09-09 VITALS — BP 158/92 | Temp 98.2°F | Ht 65.0 in | Wt 179.9 lb

## 2015-09-09 DIAGNOSIS — E1142 Type 2 diabetes mellitus with diabetic polyneuropathy: Secondary | ICD-10-CM

## 2015-09-09 DIAGNOSIS — F3175 Bipolar disorder, in partial remission, most recent episode depressed: Secondary | ICD-10-CM | POA: Diagnosis not present

## 2015-09-09 DIAGNOSIS — Z7689 Persons encountering health services in other specified circumstances: Secondary | ICD-10-CM

## 2015-09-09 DIAGNOSIS — Z7189 Other specified counseling: Secondary | ICD-10-CM | POA: Diagnosis not present

## 2015-09-09 DIAGNOSIS — F319 Bipolar disorder, unspecified: Secondary | ICD-10-CM | POA: Insufficient documentation

## 2015-09-09 DIAGNOSIS — I10 Essential (primary) hypertension: Secondary | ICD-10-CM

## 2015-09-09 MED ORDER — LISINOPRIL 20 MG PO TABS: 20.0000 mg | ORAL_TABLET | Freq: Every day | ORAL | Status: DC

## 2015-09-09 MED ORDER — QUETIAPINE FUMARATE ER 50 MG PO TB24: 50.0000 mg | ORAL_TABLET | Freq: Every day | ORAL | Status: DC

## 2015-09-09 NOTE — Patient Instructions (Signed)
It was great seeing you again! You are making tremendous strides and should be very happy with yourself.   As discussed  1) Go up on your insulin 1 unit per day until your blood sugars are in the 120-130. If you get to 45 units before that time then stop at 45 units and we will reevaluate   2) I went up on your lisinopril to '20mg'$ . You can take double of what you have at home in order to finish out that bottle.   3) Start the [employer] [facility]. If you notice that you are taking Celexa, please stop that medication.   4) Follow up with me in 2 weeks for recheck.

## 2015-09-09 NOTE — Progress Notes (Signed)
Pre visit review using our clinic review tool, if applicable. No additional management support is needed unless otherwise documented below in the visit note. 

## 2015-09-09 NOTE — Progress Notes (Signed)
HPI:  Tara Hopkins is here to establish care. She is a pleasant AA female who  has a past medical history of Diabetes mellitus without complication ([place]); Hypercholesteremia; Peripheral neuropathy ([place]); Hypertension; Anxiety; Depression; History of blood transfusion; History of hyperbaric oxygen therapy; Anemia; Glaucoma; Stroke ([place]) (02/2015); Cataracts, bilateral; and Bipolar 1 disorder ([place]).  Last PCP and physical: Over one year.  Immunizations:UTD Diet:Trying to eat healthy Exercise:Walks every night Colonoscopy:2016 - follow up in 5 years due to polyps  Has the following chronic problems that require follow up and concerns today:  Smoking cessation - She is down to four cigarettes. She is using nicotine gum. Feels like this is working for her.   Depression - She is unsure if she takes any medications for depression. She will bring all her medications with her to her physical.   Diabetes.  - She checks her blood sugars once a day. Currently they are ranging from 160-300. She is taking 30 units of lantus at night and using sliding scale. She is also using Amaryl.  Lab Results  Component Value Date   HGBA1C 10.2* 08/09/2015   She has lost 2 pounds since her last visit. She has started walking and eating better.    ROS negative for unless reported above: fevers, chills,feeling poorly, unintentional weight loss, hearing or vision loss, chest pain, palpitations, leg claudication, struggling to breath,Not feeling congested in the chest, no orthopenia, no cough,no wheezing, normal appetite, no soft tissue swelling, no hemoptysis, melena, hematochezia, hematuria, falls, loc, si, or thoughts of self harm.    Past Medical History  Diagnosis Date  . Diabetes mellitus without complication ([place])   . Hypercholesteremia   . Peripheral neuropathy ([place])   . Hypertension   . Anxiety   . Depression   . History of blood transfusion   . History of hyperbaric oxygen therapy     pt  reports 80 treatments.  . Anemia   . Glaucoma   . Stroke ([place]) 02/2015  . Cataracts, bilateral   . Bipolar 1 disorder [facility])     Past Surgical History  Procedure Laterality Date  . I&d extremity Right 05/23/2013    Procedure: IRRIGATION AND DEBRIDEMENT RIGHT GREAT TOE;  Surgeon: Mauri Pole, MD;  Location: [facility];  Service: Orthopedics;  Laterality: Right;  . Amputation Right 12/15/2013    Procedure: Right Transmetatarsal Amputation;  Surgeon: Newt Minion, MD;  Location: [place];  Service: Orthopedics;  Laterality: Right;  . Amputation Right 02/23/2014    Procedure: AMPUTATION FOOT;  Surgeon: Newt Minion, MD;  Location: [place];  Service: Orthopedics;  Laterality: Right;  Right Foot Revision Transmetatarsal Amputation    Family History  Problem Relation Age of Onset  . Colon cancer Neg Hx   . Liver cancer Mother   . Lung cancer Mother   . Hypertension Father   . Depression Father   . Depression Mother     Social History   Social History  . Marital Status: Married    Spouse Name: N/A  . Number of Children: 3  . Years of Education: 14   Occupational History  . Disbaled    Social History Main Topics  . Smoking status: Current Every Day Smoker -- 0.01 packs/day for 20 years    Types: Cigarettes  . Smokeless tobacco: Never Used  . Alcohol Use: 0.0 oz/week    0 Standard drinks or equivalent per week     Comment: beer every 3 months  .  Drug Use: No  . Sexual Activity: Not on file   Other Topics Concern  . Not on file   Social History Narrative   Lives at home alone.   Right-handed.   No caffeine use.   Oldest of 12 children         Current outpatient prescriptions:  .  aspirin EC 325 MG EC tablet, Take 1 tablet (325 mg total) by mouth daily., Disp: 30 tablet, Rfl: 0 .  B-D UF III MINI PEN NEEDLES 31G X 5 MM MISC, as directed, Disp: 100 each, Rfl: 0 .  clonazePAM (KLONOPIN) 0.5 MG tablet, take 1 tablet by mouth three times a day if needed, Disp: 90 tablet, Rfl:  0 .  COD LIVER OIL PO, Take 1 capsule by mouth daily., Disp: , Rfl:  .  gabapentin (NEURONTIN) 400 MG capsule, take 2 capsules by mouth twice a day, Disp: 120 capsule, Rfl: 0 .  gentamicin (GARAMYCIN) 0.3 % ophthalmic solution, , Disp: , Rfl: 0 .  glimepiride (AMARYL) 4 MG tablet, take 1 tablet by mouth once daily, Disp: 30 tablet, Rfl: 1 .  glucose blood test strip, Use as instructed, Disp: 100 each, Rfl: 12 .  ILEVRO 0.3 % ophthalmic suspension, , Disp: , Rfl: 0 .  lamoTRIgine (LAMICTAL) 100 MG tablet, TAKE 1/2 TABLET BY MOUTH TWICE DAILY, Disp: 30 tablet, Rfl: 2 .  LANTUS 100 UNIT/ML injection, INJECT 30 UNITS AT BEDTIME, Disp: 10 vial, Rfl: 0 .  latanoprost (XALATAN) 0.005 % ophthalmic solution, place 1 drop into both eyes at bedtime, Disp: 2.5 mL, Rfl: 12 .  NOVOLOG FLEXPEN 100 UNIT/ML FlexPen, INJECT 2-6 UNITS INTO SKIN THREE TIMES DAILY, Disp: 15 pen, Rfl: 0 .  ondansetron (ZOFRAN) 4 MG tablet, Take 1 tablet (4 mg total) by mouth every 8 (eight) hours as needed for nausea or vomiting., Disp: 30 tablet, Rfl: 1 .  ONE TOUCH LANCETS MISC, USE TO CHECK BLOOD SUGAR 3 TIMES DAILY, Disp: 200 each, Rfl: 3 .  prednisoLONE acetate (PRED FORTE) 1 % ophthalmic suspension, , Disp: , Rfl: 0 .  simvastatin (ZOCOR) 20 MG tablet, Take 1 tablet (20 mg total) by mouth at bedtime., Disp: 90 tablet, Rfl: 0 .  simvastatin (ZOCOR) 20 MG tablet, take 1 tablet by mouth at bedtime, Disp: 30 tablet, Rfl: 1 .  white petrolatum (VASELINE) GEL, Apply 1 application topically at bedtime. Apply to both feet, Disp: , Rfl:  .  zinc gluconate 50 MG tablet, Take 50 mg by mouth 2 (two) times daily., Disp: , Rfl:  .  lisinopril (PRINIVIL,ZESTRIL) 20 MG tablet, Take 1 tablet (20 mg total) by mouth daily., Disp: 90 tablet, Rfl: 3 .  QUEtiapine (SEROQUEL XR) 50 MG TB24 24 hr tablet, Take 1 tablet (50 mg total) by mouth at bedtime., Disp: 30 each, Rfl: 3  EXAM:  Filed Vitals:   09/09/15 1408  BP: 158/92  Temp: 98.2 F (36.8  C)    Body mass index is 29.94 kg/(m^2).  GENERAL: vitals reviewed and listed above, alert, oriented, appears well hydrated and in no acute distress  HEENT: atraumatic, conjunttiva clear, no obvious abnormalities on inspection of external nose and ears  NECK: Neck is soft and supple without masses, no adenopathy or thyromegaly, trachea midline, no JVD. Normal range of motion.   LUNGS: clear to auscultation bilaterally, no wheezes, rales or rhonchi, Fair air movement.  CV: Regular rate and rhythm, normal S1/S2, no audible murmurs, gallops, or rubs. No carotid bruit and no  peripheral edema.   MS: moves all extremities without noticeable abnormality. No edema noted. Walks with a steady but shuffled gait. Uses a single prong cane.   Abd: soft/nontender/nondistended/normal bowel sounds   Skin: warm and dry, no rash   Extremities: No clubbing, cyanosis, or edema. Capillary refill is WNL. Pulses intact bilaterally in upper and lower extremities.   Neuro: CN II-XII intact, sensation and reflexes normal throughout, 5/5 muscle strength in bilateral upper and lower extremities. Normal finger to nose.   PSYCH: pleasant and cooperative, no obvious depression or anxiety  ASSESSMENT AND PLAN: 1. Encounter to establish care - Follow up in December for CPE - Follow up sooner if needed  2. DM type 2 with diabetic peripheral neuropathy (HCC) - Increase Lantus one unit a day until at 45 units or blood sugars are around 120-130.  - Consider adding oral agent - Consider endocrinology referral.  - Follow up in 2 weeks  3. Bipolar disorder, in partial remission, most recent episode depressed ([place]) - Was taking Latuda but was unable to afford it - QUEtiapine (SEROQUEL XR) 50 MG TB24 24 hr tablet; Take 1 tablet (50 mg total) by mouth at bedtime.  Dispense: 30 each; Refill: 3 - Follow up in 2 weeks.   4. Essential hypertension - Follow up in 2 weeks  - Monitor BP at home - lisinopril  (PRINIVIL,ZESTRIL) 20 MG tablet; Take 1 tablet (20 mg total) by mouth daily.  Dispense: 90 tablet; Refill: 3  -We reviewed the PMH, PSH, FH, SH, Meds and Allergies. -We provided refills for any medications we will prescribe as needed. -We addressed current concerns per orders and patient instructions. -We have asked for records for pertinent exams, studies, vaccines and notes from previous providers. -We have advised patient to follow up per instructions below.   -Patient advised to return or notify a provider immediately if symptoms worsen or persist or new concerns arise.     [name], AGNP

## 2015-09-11 ENCOUNTER — Telehealth: Payer: Self-pay | Admitting: Adult Health

## 2015-09-11 NOTE — Telephone Encounter (Signed)
Both prescriptions were sent to the pharmacy.  Called the pharmacy and spoke with Ronalee Belts and both are ready for pick up. Left a message for pt that rx is ready at the pharmacy.

## 2015-09-11 NOTE — Telephone Encounter (Signed)
Pt needs generic seroquel and lisinopril send to [facility]

## 2015-09-12 DIAGNOSIS — Z0271 Encounter for disability determination: Secondary | ICD-10-CM

## 2015-09-12 NOTE — Telephone Encounter (Signed)
Called [facility] and spoke with the pharmacist Ronalee Belts and he states Seroquel is not generic and it cost $40 and the lisinopril is $30 dollars for a 90 day supply.  According to Ronalee Belts a 30 day supply is '@12'$  and the 90 day will save her about 6 dollars over the long run.  I will call and speak with pt about the medications.  Is there another option for the Seroquel?

## 2015-09-13 ENCOUNTER — Other Ambulatory Visit: Payer: Self-pay | Admitting: Adult Health

## 2015-09-13 MED ORDER — ARIPIPRAZOLE 5 MG PO TABS: 5.0000 mg | ORAL_TABLET | Freq: Every day | ORAL | Status: DC

## 2015-09-13 NOTE — Telephone Encounter (Signed)
Left a message on the pharmacy to discontinue the Seroquel and Abilify.

## 2015-09-13 NOTE — Telephone Encounter (Signed)
Sent in prescription for Abilify. This will take the place of Seroquel

## 2015-09-13 NOTE — Addendum Note (Signed)
Addended by: Colleen Can on: 09/13/2015 03:56 PM   Modules accepted: Orders, Medications

## 2015-09-13 NOTE — Telephone Encounter (Signed)
Pt states she does not want Ambilify because she has 2 relatives that had this medication and with messed with their mouths. Pt states she has the Klonpin and she will just use that. Pt will discuss this at her follow up appt.

## 2015-09-17 ENCOUNTER — Telehealth: Payer: Self-pay | Admitting: Adult Health

## 2015-09-17 NOTE — Telephone Encounter (Signed)
Pt call to say she was suppose to provide you with a phone number   [phone]

## 2015-09-19 NOTE — Telephone Encounter (Signed)
Called pt and she said the number was to [place] to get her a nurse

## 2015-09-19 NOTE — Telephone Encounter (Signed)
Can we see what this phone number is in reference to? I cannot remember

## 2015-09-23 NOTE — Telephone Encounter (Signed)
The office usually does not call [employer] to get pt a nurse.  A referral can be ordered for pt if needed. Left a message for return call.

## 2015-09-24 ENCOUNTER — Encounter: Payer: Self-pay | Admitting: Adult Health

## 2015-09-24 ENCOUNTER — Ambulatory Visit (INDEPENDENT_AMBULATORY_CARE_PROVIDER_SITE_OTHER): Payer: BC Managed Care – PPO | Admitting: Adult Health

## 2015-09-24 VITALS — BP 120/90 | Temp 98.2°F | Ht 65.0 in | Wt 178.9 lb

## 2015-09-24 DIAGNOSIS — I10 Essential (primary) hypertension: Secondary | ICD-10-CM

## 2015-09-24 DIAGNOSIS — E1142 Type 2 diabetes mellitus with diabetic polyneuropathy: Secondary | ICD-10-CM

## 2015-09-24 NOTE — Telephone Encounter (Signed)
Pt was seen in the office today; Tommi Rumps and I will call to see exactly what is needed.

## 2015-09-24 NOTE — Progress Notes (Signed)
Subjective:    Patient ID: Tara Hopkins, female    DOB: 20-Mar-1951, 64 y.o.   MRN: [id number]  HPI  63 year old female who presents to the office today for two weeks follow up regarding her diabetes and hypertension. She endorses that she is monitoring her blood pressures at home and they continue to be 950-932 systolic.  Today in the office her BP is 120/90. She has lost one additional pound.   She has been increasing her Lantus one unit a night and is currently at 40 units. Reports that her blood sugars are in the 150's-160's. She did not bring her glucometer to this meeting.   She did not start Abilify yet.   Overall she states that she is feeling much better  Review of Systems  Constitutional: Negative.   HENT: Negative.   Respiratory: Negative.   Cardiovascular: Negative.   Genitourinary: Negative.   Neurological: Negative.   All other systems reviewed and are negative.  Past Medical History  Diagnosis Date  . Diabetes mellitus without complication ([place])   . Hypercholesteremia   . Peripheral neuropathy ([place])   . Hypertension   . Anxiety   . Depression   . History of blood transfusion   . History of hyperbaric oxygen therapy     pt reports 80 treatments.  . Anemia   . Glaucoma   . Stroke ([place]) 02/2015  . Cataracts, bilateral   . Bipolar 1 disorder [facility])     Social History   Social History  . Marital Status: Married    Spouse Name: N/A  . Number of Children: 3  . Years of Education: 14   Occupational History  . Disbaled    Social History Main Topics  . Smoking status: Current Every Day Smoker -- 0.01 packs/day for 20 years    Types: Cigarettes  . Smokeless tobacco: Never Used  . Alcohol Use: 0.0 oz/week    0 Standard drinks or equivalent per week     Comment: beer every 3 months  . Drug Use: No  . Sexual Activity: Not on file   Other Topics Concern  . Not on file   Social History Narrative   Lives at home alone.   Right-handed.   No caffeine  use.   Oldest of 12 children        Past Surgical History  Procedure Laterality Date  . I&d extremity Right 05/23/2013    Procedure: IRRIGATION AND DEBRIDEMENT RIGHT GREAT TOE;  Surgeon: Mauri Pole, MD;  [facility];  Service: Orthopedics;  Laterality: Right;  . Amputation Right 12/15/2013    Procedure: Right Transmetatarsal Amputation;  Surgeon: Newt Minion, MD;  Location: [place];  Service: Orthopedics;  Laterality: Right;  . Amputation Right 02/23/2014    Procedure: AMPUTATION FOOT;  Surgeon: Newt Minion, MD;  Location: [place];  Service: Orthopedics;  Laterality: Right;  Right Foot Revision Transmetatarsal Amputation    Family History  Problem Relation Age of Onset  . Colon cancer Neg Hx   . Liver cancer Mother   . Lung cancer Mother   . Hypertension Father   . Depression Father   . Depression Mother     No Known Allergies  Current Outpatient Prescriptions on File Prior to Visit  Medication Sig Dispense Refill  . aspirin EC 325 MG EC tablet Take 1 tablet (325 mg total) by mouth daily. 30 tablet 0  . B-D UF III MINI PEN NEEDLES 31G X 5 MM  MISC as directed 100 each 0  . clonazePAM (KLONOPIN) 0.5 MG tablet take 1 tablet by mouth three times a day if needed 90 tablet 0  . COD LIVER OIL PO Take 1 capsule by mouth daily.    Marland Kitchen gabapentin (NEURONTIN) 400 MG capsule take 2 capsules by mouth twice a day 120 capsule 0  . gentamicin (GARAMYCIN) 0.3 % ophthalmic solution   0  . glimepiride (AMARYL) 4 MG tablet take 1 tablet by mouth once daily 30 tablet 1  . glucose blood test strip Use as instructed 100 each 12  . ILEVRO 0.3 % ophthalmic suspension   0  . lamoTRIgine (LAMICTAL) 100 MG tablet TAKE 1/2 TABLET BY MOUTH TWICE DAILY 30 tablet 2  . LANTUS 100 UNIT/ML injection INJECT 30 UNITS AT BEDTIME 10 vial 0  . latanoprost (XALATAN) 0.005 % ophthalmic solution place 1 drop into both eyes at bedtime 2.5 mL 12  . lisinopril (PRINIVIL,ZESTRIL) 20 MG tablet Take 1 tablet (20 mg  total) by mouth daily. 90 tablet 3  . NOVOLOG FLEXPEN 100 UNIT/ML FlexPen INJECT 2-6 UNITS INTO SKIN THREE TIMES DAILY 15 pen 0  . ondansetron (ZOFRAN) 4 MG tablet Take 1 tablet (4 mg total) by mouth every 8 (eight) hours as needed for nausea or vomiting. 30 tablet 1  . ONE TOUCH LANCETS MISC USE TO CHECK BLOOD SUGAR 3 TIMES DAILY 200 each 3  . prednisoLONE acetate (PRED FORTE) 1 % ophthalmic suspension   0  . simvastatin (ZOCOR) 20 MG tablet Take 1 tablet (20 mg total) by mouth at bedtime. 90 tablet 0  . simvastatin (ZOCOR) 20 MG tablet take 1 tablet by mouth at bedtime 30 tablet 1  . white petrolatum (VASELINE) GEL Apply 1 application topically at bedtime. Apply to both feet    . zinc gluconate 50 MG tablet Take 50 mg by mouth 2 (two) times daily.     No current facility-administered medications on file prior to visit.    BP 120/90 mmHg  Temp(Src) 98.2 F (36.8 C) (Oral)  Ht '5\' 5"'$  (1.651 m)  Wt 178 lb 14.4 oz (81.149 kg)  BMI 29.77 kg/m2       Objective:   Physical Exam  Constitutional: She is oriented to person, place, and time. She appears well-developed and well-nourished. No distress.  Cardiovascular: Normal rate, regular rhythm, normal heart sounds and intact distal pulses.  Exam reveals no gallop and no friction rub.   No murmur heard. Pulmonary/Chest: Effort normal and breath sounds normal. No respiratory distress. She has no wheezes. She has no rales. She exhibits no tenderness.  Musculoskeletal:  Steady shuffling gait  Neurological: She is alert and oriented to person, place, and time.  Skin: Skin is warm and dry. No rash noted. She is not diaphoretic. No erythema. No pallor.  Psychiatric: She has a normal mood and affect. Her behavior is normal. Judgment and thought content normal.  Nursing note and vitals reviewed.      Assessment & Plan:   She has an appointment in December for her physical. I would like her to continue to take her current dose of medications and  keep a log. Bring that to her appointment. I also want her to continue increasing her Lantus one unit a night but to stop at 50 units until she sees me next. Continue to work on and exercise. Follow up as needed

## 2015-09-24 NOTE — Patient Instructions (Addendum)
It was great seeing you again!  Continue with the current dose of blood pressure medication. Write down your blood pressures on a sheet of paper and bring it with you to the physical   Continue to monitor your blood sugars at home and add one unit of Lantus a day until blood sugars are 120-130, stop at 50 units until you see me next.   Start taking the Abiilify.   Continue to work on diet and losing weight, you are making GREAT progress.   Have a great Thanksgiving. !

## 2015-09-27 ENCOUNTER — Other Ambulatory Visit: Payer: Self-pay | Admitting: Family

## 2015-10-09 ENCOUNTER — Other Ambulatory Visit: Payer: Self-pay | Admitting: Adult Health

## 2015-10-09 NOTE — Telephone Encounter (Signed)
Ok to refill 

## 2015-10-22 ENCOUNTER — Encounter: Payer: Self-pay | Admitting: Adult Health

## 2015-10-22 ENCOUNTER — Ambulatory Visit (INDEPENDENT_AMBULATORY_CARE_PROVIDER_SITE_OTHER): Payer: Medicare Other | Admitting: Adult Health

## 2015-10-22 VITALS — BP 120/80 | Temp 98.1°F | Ht 65.0 in | Wt 182.2 lb

## 2015-10-22 DIAGNOSIS — E1142 Type 2 diabetes mellitus with diabetic polyneuropathy: Secondary | ICD-10-CM | POA: Diagnosis not present

## 2015-10-22 DIAGNOSIS — Z0000 Encounter for general adult medical examination without abnormal findings: Secondary | ICD-10-CM | POA: Diagnosis not present

## 2015-10-22 DIAGNOSIS — I10 Essential (primary) hypertension: Secondary | ICD-10-CM

## 2015-10-22 LAB — CBC WITH DIFFERENTIAL/PLATELET
BASOS PCT: 0.7 % (ref 0.0–3.0)
Basophils Absolute: 0.1 10*3/uL (ref 0.0–0.1)
EOS PCT: 2.1 % (ref 0.0–5.0)
Eosinophils Absolute: 0.2 10*3/uL (ref 0.0–0.7)
HEMATOCRIT: 38.7 % (ref 36.0–46.0)
Hemoglobin: 12.6 g/dL (ref 12.0–15.0)
LYMPHS PCT: 40.6 % (ref 12.0–46.0)
Lymphs Abs: 3.8 10*3/uL (ref 0.7–4.0)
MCHC: 32.7 g/dL (ref 30.0–36.0)
MCV: 89.3 fl (ref 78.0–100.0)
MONOS PCT: 4.4 % (ref 3.0–12.0)
Monocytes Absolute: 0.4 10*3/uL (ref 0.1–1.0)
Neutro Abs: 4.8 10*3/uL (ref 1.4–7.7)
Neutrophils Relative %: 52.2 % (ref 43.0–77.0)
Platelets: 353 10*3/uL (ref 150.0–400.0)
RBC: 4.33 Mil/uL (ref 3.87–5.11)
RDW: 13.9 % (ref 11.5–15.5)
WBC: 9.3 10*3/uL (ref 4.0–10.5)

## 2015-10-22 LAB — BASIC METABOLIC PANEL
BUN: 25 mg/dL — AB (ref 6–23)
CHLORIDE: 106 meq/L (ref 96–112)
CO2: 26 mEq/L (ref 19–32)
Calcium: 10 mg/dL (ref 8.4–10.5)
Creatinine, Ser: 1.68 mg/dL — ABNORMAL HIGH (ref 0.40–1.20)
GFR: 39.53 mL/min — AB (ref >60.00)
Glucose, Bld: 121 mg/dL — ABNORMAL HIGH (ref 70–99)
POTASSIUM: 4.6 meq/L (ref 3.5–5.1)
SODIUM: 142 meq/L (ref 135–145)

## 2015-10-22 LAB — LIPID PANEL
CHOLESTEROL: 229 mg/dL — AB (ref 0–200)
HDL: 54.2 mg/dL (ref >39.00)
NONHDL: 174.91
TRIGLYCERIDES: 327 mg/dL — AB (ref 0.0–149.0)
Total CHOL/HDL Ratio: 4
VLDL: 65.4 mg/dL — ABNORMAL HIGH (ref 0.0–40.0)

## 2015-10-22 LAB — LDL CHOLESTEROL, DIRECT: Direct LDL: 124 mg/dL

## 2015-10-22 LAB — TSH: TSH: 0.93 u[IU]/mL (ref 0.35–4.50)

## 2015-10-22 LAB — HEMOGLOBIN A1C: HEMOGLOBIN A1C: 10.9 % — AB (ref 4.6–6.5)

## 2015-10-22 MED ORDER — LISINOPRIL 20 MG PO TABS: 20.0000 mg | ORAL_TABLET | Freq: Every day | ORAL | Status: DC

## 2015-10-22 NOTE — Progress Notes (Signed)
Subjective:    Patient ID: Tara Hopkins, female    DOB: Aug 22, 1951, 64 y.o.   MRN: [id number]  HPI  Patient presents for yearly preventative medicine examination.  All immunizations and health maintenance protocols were reviewed with the patient and needed orders were placed.  Appropriate screening laboratory values were ordered for the patient including screening of hyperlipidemia, renal function and hepatic function.  Medication reconciliation,  past medical history, social history, problem list and allergies were reviewed in detail with the patient  Goals were established with regard to weight loss, exercise, and  diet in compliance with medications  End of life planning was discussed.   She has had a PAP last year, which she reports was normal. She will make an appointment for a mammogram.   She is monitoring her blood sugars at home and reports that they are between 150- 190 on most days. She continues to work on weight loss.   Wt Readings from Last 3 Encounters:  10/22/15 182 lb 3.2 oz (82.645 kg)  09/24/15 178 lb 14.4 oz (81.149 kg)  09/09/15 179 lb 14.4 oz (81.602 kg)       Review of Systems  Constitutional: Negative.   HENT: Negative.   Eyes: Negative.   Respiratory: Negative.   Cardiovascular: Negative.   Gastrointestinal: Negative.   Endocrine: Negative.   Genitourinary: Negative.   Musculoskeletal: Negative.   Skin: Negative.   Allergic/Immunologic: Negative.   Neurological: Negative.   Hematological: Negative.   Psychiatric/Behavioral: Negative.   All other systems reviewed and are negative.  Past Medical History  Diagnosis Date  . Diabetes mellitus without complication ([place])   . Hypercholesteremia   . Peripheral neuropathy ([place])   . Hypertension   . Anxiety   . Depression   . History of blood transfusion   . History of hyperbaric oxygen therapy     pt reports 80 treatments.  . Anemia   . Glaucoma   . Stroke ([place]) 02/2015  . Cataracts,  bilateral   . Bipolar 1 disorder [facility])     Social History   Social History  . Marital Status: Married    Spouse Name: N/A  . Number of Children: 3  . Years of Education: 14   Occupational History  . Disbaled    Social History Main Topics  . Smoking status: Current Every Day Smoker -- 0.01 packs/day for 20 years    Types: Cigarettes  . Smokeless tobacco: Never Used  . Alcohol Use: 0.0 oz/week    0 Standard drinks or equivalent per week     Comment: beer every 3 months  . Drug Use: No  . Sexual Activity: Not on file   Other Topics Concern  . Not on file   Social History Narrative   Lives at home alone.   Right-handed.   No caffeine use.   Oldest of 12 children        Past Surgical History  Procedure Laterality Date  . I&d extremity Right 05/23/2013    Procedure: IRRIGATION AND DEBRIDEMENT RIGHT GREAT TOE;  Surgeon: Mauri Pole, MD;  Location: [facility];  Service: Orthopedics;  Laterality: Right;  . Amputation Right 12/15/2013    Procedure: Right Transmetatarsal Amputation;  Surgeon: Newt Minion, MD;  Location: [place];  Service: Orthopedics;  Laterality: Right;  . Amputation Right 02/23/2014    Procedure: AMPUTATION FOOT;  Surgeon: Newt Minion, MD;  Location: [place];  Service: Orthopedics;  Laterality: Right;  Right Foot Revision Transmetatarsal  Amputation    Family History  Problem Relation Age of Onset  . Colon cancer Neg Hx   . Liver cancer Mother   . Lung cancer Mother   . Hypertension Father   . Depression Father   . Depression Mother     No Known Allergies  Current Outpatient Prescriptions on File Prior to Visit  Medication Sig Dispense Refill  . aspirin EC 325 MG EC tablet Take 1 tablet (325 mg total) by mouth daily. 30 tablet 0  . B-D UF III MINI PEN NEEDLES 31G X 5 MM MISC use as directed 100 each 5  . clonazePAM (KLONOPIN) 0.5 MG tablet take 1 tablet by mouth three times a day if needed 90 tablet 0  . COD LIVER OIL PO Take 1 capsule by mouth daily.     Marland Kitchen gabapentin (NEURONTIN) 400 MG capsule take 2 capsules by mouth twice a day 120 capsule 5  . gentamicin (GARAMYCIN) 0.3 % ophthalmic solution   0  . glimepiride (AMARYL) 4 MG tablet take 1 tablet by mouth once daily 30 tablet 1  . glucose blood test strip Use as instructed 100 each 12  . ILEVRO 0.3 % ophthalmic suspension   0  . lamoTRIgine (LAMICTAL) 100 MG tablet TAKE 1/2 TABLET BY MOUTH TWICE DAILY 30 tablet 2  . LANTUS 100 UNIT/ML injection INJECT 30 UNITS SUBCUTANEOUSLY ONCE AT BEDTIME 10 vial 5  . latanoprost (XALATAN) 0.005 % ophthalmic solution place 1 drop into both eyes at bedtime 2.5 mL 12  . NOVOLOG FLEXPEN 100 UNIT/ML FlexPen INJECT 2-6 UNITS INTO SKIN THREE TIMES DAILY 15 pen 0  . ondansetron (ZOFRAN) 4 MG tablet Take 1 tablet (4 mg total) by mouth every 8 (eight) hours as needed for nausea or vomiting. 30 tablet 1  . ONE TOUCH LANCETS MISC USE TO CHECK BLOOD SUGAR 3 TIMES DAILY 200 each 3  . prednisoLONE acetate (PRED FORTE) 1 % ophthalmic suspension   0  . simvastatin (ZOCOR) 20 MG tablet Take 1 tablet (20 mg total) by mouth at bedtime. 90 tablet 0  . simvastatin (ZOCOR) 20 MG tablet take 1 tablet by mouth at bedtime 30 tablet 1  . white petrolatum (VASELINE) GEL Apply 1 application topically at bedtime. Apply to both feet    . zinc gluconate 50 MG tablet Take 50 mg by mouth 2 (two) times daily.     No current facility-administered medications on file prior to visit.    BP 120/80 mmHg  Temp(Src) 98.1 F (36.7 C) (Oral)  Ht '5\' 5"'$  (1.651 m)  Wt 182 lb 3.2 oz (82.645 kg)  BMI 30.32 kg/m2       Objective:   Physical Exam  Constitutional: She is oriented to person, place, and time. She appears well-developed and well-nourished. No distress.  HENT:  Head: Normocephalic and atraumatic.  Right Ear: External ear normal.  Left Ear: External ear normal.  Nose: Nose normal.  Mouth/Throat: Oropharynx is clear and moist. No oropharyngeal exudate.  Eyes: Conjunctivae and  EOM are normal. Pupils are equal, round, and reactive to light. Right eye exhibits no discharge. Left eye exhibits no discharge. No scleral icterus.  Neck: Normal range of motion. Neck supple. No JVD present. No tracheal deviation present. No thyromegaly present.  Cardiovascular: Normal rate, regular rhythm, normal heart sounds and intact distal pulses.  Exam reveals no gallop and no friction rub.   No murmur heard. No carotid bruit  Pulmonary/Chest: Effort normal and breath sounds normal. No stridor.  No respiratory distress. She has no wheezes. She has no rales. She exhibits no tenderness.  Abdominal: Soft. Bowel sounds are normal. She exhibits no distension and no mass. There is no tenderness. There is no rebound and no guarding.  Genitourinary:  Breast Exam: No lumps, masses, dimpling, or discharge.   Musculoskeletal: Normal range of motion. She exhibits no edema or tenderness.  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  Neurological: She is alert and oriented to person, place, and time. She has normal reflexes. She displays normal reflexes. No cranial nerve deficit. She exhibits normal muscle tone. Coordination normal.  Skin: Skin is warm and dry. No rash noted. She is not diaphoretic. No erythema. No pallor.  Psychiatric: She has a normal mood and affect. Her behavior is normal. Judgment and thought content normal.  Nursing note and vitals reviewed.     Assessment & Plan:  1. Routine general medical examination at a health care facility - Basic metabolic panel - CBC with Differential - Hemoglobin A1c - Lipid panel - TSH  - EKG 12-Lead - Sinus  Rhythm  -Nonspecific ST depression   +   Nonspecific T-abnormality  -Nondiagnostic., Rate 69. Consistent with past EKGs - Up 4 pounds on our scale since last visit. Continue to make healthy diet choices  2. Essential hypertension - EKG 12-Lead - lisinopril (PRINIVIL,ZESTRIL) 20 MG tablet; Take 1  tablet (20 mg total) by mouth daily.  Dispense: 90 tablet; Refill: 3 - Controlled on current dose.   3. DM type 2 with diabetic peripheral neuropathy (HCC) - Basic metabolic panel - CBC with Differential - Hemoglobin A1c - Continue with Amaryl, sliding scale Novolog and nightly Lantus ( 45 units)

## 2015-10-22 NOTE — Progress Notes (Signed)
Pre visit review using our clinic review tool, if applicable. No additional management support is needed unless otherwise documented below in the visit note. 

## 2015-10-22 NOTE — Patient Instructions (Addendum)
As always, it was a pleasure seeing you today!  I will follow up with you regarding your lab work.   If you need anything in the meantime, please let me know.   I hope you have a very Merry Christmas!    Health Maintenance, Female Adopting a healthy lifestyle and getting preventive care can go a long way to promote health and wellness. Talk with your health care provider about what schedule of regular examinations is right for you. This is a good chance for you to check in with your provider about disease prevention and staying healthy. In between checkups, there are plenty of things you can do on your own. Experts have done a lot of research about which lifestyle changes and preventive measures are most likely to keep you healthy. Ask your health care provider for more information. WEIGHT AND DIET  Eat a healthy diet  Be sure to include plenty of vegetables, fruits, low-fat dairy products, and lean protein.  Do not eat a lot of foods high in solid fats, added sugars, or salt.  Get regular exercise. This is one of the most important things you can do for your health.  Most adults should exercise for at least 150 minutes each week. The exercise should increase your heart rate and make you sweat (moderate-intensity exercise).  Most adults should also do strengthening exercises at least twice a week. This is in addition to the moderate-intensity exercise.  Maintain a healthy weight  Body mass index (BMI) is a measurement that can be used to identify possible weight problems. It estimates body fat based on height and weight. Your health care provider can help determine your BMI and help you achieve or maintain a healthy weight.  For females 30 years of age and older:   A BMI below 18.5 is considered underweight.  A BMI of 18.5 to 24.9 is normal.  A BMI of 25 to 29.9 is considered overweight.  A BMI of 30 and above is considered obese.  Watch levels of cholesterol and blood  lipids  You should start having your blood tested for lipids and cholesterol at 63 years of age, then have this test every 5 years.  You may need to have your cholesterol levels checked more often if:  Your lipid or cholesterol levels are high.  You are older than 63 years of age.  You are at high risk for heart disease.  CANCER SCREENING   Lung Cancer  Lung cancer screening is recommended for adults 16-46 years old who are at high risk for lung cancer because of a history of smoking.  A yearly low-dose CT scan of the lungs is recommended for people who:  Currently smoke.  Have quit within the past 15 years.  Have at least a 30-pack-year history of smoking. A pack year is smoking an average of one pack of cigarettes a day for 1 year.  Yearly screening should continue until it has been 15 years since you quit.  Yearly screening should stop if you develop a health problem that would prevent you from having lung cancer treatment.  Breast Cancer  Practice breast self-awareness. This means understanding how your breasts normally appear and feel.  It also means doing regular breast self-exams. Let your health care provider know about any changes, no matter how small.  If you are in your 20s or 30s, you should have a clinical breast exam (CBE) by a health care provider every 1-3 years as part of a  regular health exam.  If you are 40 or older, have a CBE every year. Also consider having a breast X-ray (mammogram) every year.  If you have a family history of breast cancer, talk to your health care provider about genetic screening.  If you are at high risk for breast cancer, talk to your health care provider about having an MRI and a mammogram every year.  Breast cancer gene (BRCA) assessment is recommended for women who have family members with BRCA-related cancers. BRCA-related cancers include:  Breast.  Ovarian.  Tubal.  Peritoneal cancers.  Results of the assessment  will determine the need for genetic counseling and BRCA1 and BRCA2 testing. Cervical Cancer Your health care provider may recommend that you be screened regularly for cancer of the pelvic organs (ovaries, uterus, and vagina). This screening involves a pelvic examination, including checking for microscopic changes to the surface of your cervix (Pap test). You may be encouraged to have this screening done every 3 years, beginning at age 84.  For women ages 99-65, health care providers may recommend pelvic exams and Pap testing every 3 years, or they may recommend the Pap and pelvic exam, combined with testing for human papilloma virus (HPV), every 5 years. Some types of HPV increase your risk of cervical cancer. Testing for HPV may also be done on women of any age with unclear Pap test results.  Other health care providers may not recommend any screening for nonpregnant women who are considered low risk for pelvic cancer and who do not have symptoms. Ask your health care provider if a screening pelvic exam is right for you.  If you have had past treatment for cervical cancer or a condition that could lead to cancer, you need Pap tests and screening for cancer for at least 20 years after your treatment. If Pap tests have been discontinued, your risk factors (such as having a new sexual partner) need to be reassessed to determine if screening should resume. Some women have medical problems that increase the chance of getting cervical cancer. In these cases, your health care provider may recommend more frequent screening and Pap tests. Colorectal Cancer  This type of cancer can be detected and often prevented.  Routine colorectal cancer screening usually begins at 63 years of age and continues through 63 years of age.  Your health care provider may recommend screening at an earlier age if you have risk factors for colon cancer.  Your health care provider may also recommend using home test kits to check  for hidden blood in the stool.  A small camera at the end of a tube can be used to examine your colon directly (sigmoidoscopy or colonoscopy). This is done to check for the earliest forms of colorectal cancer.  Routine screening usually begins at age 6.  Direct examination of the colon should be repeated every 5-10 years through 63 years of age. However, you may need to be screened more often if early forms of precancerous polyps or small growths are found. Skin Cancer  Check your skin from head to toe regularly.  Tell your health care provider about any new moles or changes in moles, especially if there is a change in a mole's shape or color.  Also tell your health care provider if you have a mole that is larger than the size of a pencil eraser.  Always use sunscreen. Apply sunscreen liberally and repeatedly throughout the day.  Protect yourself by wearing long sleeves, pants, a wide-brimmed hat,  and sunglasses whenever you are outside. HEART DISEASE, DIABETES, AND HIGH BLOOD PRESSURE   High blood pressure causes heart disease and increases the risk of stroke. High blood pressure is more likely to develop in:  People who have blood pressure in the high end of the normal range (130-139/85-89 mm Hg).  People who are overweight or obese.  People who are African American.  If you are 39-52 years of age, have your blood pressure checked every 3-5 years. If you are 19 years of age or older, have your blood pressure checked every year. You should have your blood pressure measured twice--once when you are at a hospital or clinic, and once when you are not at a hospital or clinic. Record the average of the two measurements. To check your blood pressure when you are not at a hospital or clinic, you can use:  An automated blood pressure machine at a pharmacy.  A home blood pressure monitor.  If you are between 31 years and 2 years old, ask your health care provider if you should take  aspirin to prevent strokes.  Have regular diabetes screenings. This involves taking a blood sample to check your fasting blood sugar level.  If you are at a normal weight and have a low risk for diabetes, have this test once every three years after 63 years of age.  If you are overweight and have a high risk for diabetes, consider being tested at a younger age or more often. PREVENTING INFECTION  Hepatitis B  If you have a higher risk for hepatitis B, you should be screened for this virus. You are considered at high risk for hepatitis B if:  You were born in a country where hepatitis B is common. Ask your health care provider which countries are considered high risk.  Your parents were born in a high-risk country, and you have not been immunized against hepatitis B (hepatitis B vaccine).  You have HIV or AIDS.  You use needles to inject street drugs.  You live with someone who has hepatitis B.  You have had sex with someone who has hepatitis B.  You get hemodialysis treatment.  You take certain medicines for conditions, including cancer, organ transplantation, and autoimmune conditions. Hepatitis C  Blood testing is recommended for:  Everyone born from [birth year] through 1964.  Anyone with known risk factors for hepatitis C. Sexually transmitted infections (STIs)  You should be screened for sexually transmitted infections (STIs) including gonorrhea and chlamydia if:  You are sexually active and are younger than 63 years of age.  You are older than 63 years of age and your health care provider tells you that you are at risk for this type of infection.  Your sexual activity has changed since you were last screened and you are at an increased risk for chlamydia or gonorrhea. Ask your health care provider if you are at risk.  If you do not have HIV, but are at risk, it may be recommended that you take a prescription medicine daily to prevent HIV infection. This is called  pre-exposure prophylaxis (PrEP). You are considered at risk if:  You are sexually active and do not regularly use condoms or know the HIV status of your partner(s).  You take drugs by injection.  You are sexually active with a partner who has HIV. Talk with your health care provider about whether you are at high risk of being infected with HIV. If you choose to begin PrEP, you should  first be tested for HIV. You should then be tested every 3 months for as long as you are taking PrEP.  PREGNANCY   If you are premenopausal and you may become pregnant, ask your health care provider about preconception counseling.  If you may become pregnant, take 400 to 800 micrograms (mcg) of folic acid every day.  If you want to prevent pregnancy, talk to your health care provider about birth control (contraception). OSTEOPOROSIS AND MENOPAUSE   Osteoporosis is a disease in which the bones lose minerals and strength with aging. This can result in serious bone fractures. Your risk for osteoporosis can be identified using a bone density scan.  If you are 2 years of age or older, or if you are at risk for osteoporosis and fractures, ask your health care provider if you should be screened.  Ask your health care provider whether you should take a calcium or vitamin D supplement to lower your risk for osteoporosis.  Menopause may have certain physical symptoms and risks.  Hormone replacement therapy may reduce some of these symptoms and risks. Talk to your health care provider about whether hormone replacement therapy is right for you.  HOME CARE INSTRUCTIONS   Schedule regular health, dental, and eye exams.  Stay current with your immunizations.   Do not use any tobacco products including cigarettes, chewing tobacco, or electronic cigarettes.  If you are pregnant, do not drink alcohol.  If you are breastfeeding, limit how much and how often you drink alcohol.  Limit alcohol intake to no more than 1  drink per day for nonpregnant women. One drink equals 12 ounces of beer, 5 ounces of wine, or 1 ounces of hard liquor.  Do not use street drugs.  Do not share needles.  Ask your health care provider for help if you need support or information about quitting drugs.  Tell your health care provider if you often feel depressed.  Tell your health care provider if you have ever been abused or do not feel safe at home.   This information is not intended to replace advice given to you by your health care provider. Make sure you discuss any questions you have with your health care provider.   Document Released: 05/11/2011 Document Revised: 11/16/2014 Document Reviewed: 09/27/2013 Elsevier Interactive Patient Education [employer].

## 2015-10-22 NOTE — Telephone Encounter (Signed)
Today pt told us she will be moving to [place].  If this does not happen we will revisit this. Pt verbalized understanding.

## 2015-10-23 ENCOUNTER — Other Ambulatory Visit: Payer: Self-pay | Admitting: Adult Health

## 2015-10-23 ENCOUNTER — Telehealth: Payer: Self-pay | Admitting: Adult Health

## 2015-10-23 MED ORDER — SIMVASTATIN 40 MG PO TABS: 40.0000 mg | ORAL_TABLET | Freq: Every day | ORAL | Status: DC

## 2015-10-23 NOTE — Telephone Encounter (Signed)
Spoke to patient and informed her of her labs. Her Lipids and Total Cholesterol continue to be high, will increase Simvastatin to 40 mg daily.   Her A1c increased from 10.2 - 10.9. She needs to work on her diet. Increase Lantus one unit a day until she is at 55 units ( currently at 45).   Follow up once she hits 55 units.

## 2015-11-05 ENCOUNTER — Other Ambulatory Visit: Payer: Self-pay | Admitting: Adult Health

## 2015-11-07 ENCOUNTER — Telehealth: Payer: Self-pay | Admitting: Adult Health

## 2015-11-07 NOTE — Telephone Encounter (Signed)
Pt called and wanted to know if you called her.Tara KitchenMarland Hopkins

## 2015-11-07 NOTE — Telephone Encounter (Signed)
Just called to check on pt; no further action needed.

## 2015-11-12 ENCOUNTER — Telehealth: Payer: Self-pay | Admitting: Adult Health

## 2015-11-12 DIAGNOSIS — I10 Essential (primary) hypertension: Secondary | ICD-10-CM

## 2015-11-12 MED ORDER — LISINOPRIL 20 MG PO TABS: 20.0000 mg | ORAL_TABLET | Freq: Every day | ORAL | Status: DC

## 2015-11-12 NOTE — Telephone Encounter (Signed)
Called and spoke with pt and pt states Tara Hopkins was suppose to send her prescription to [facility] because it was only 4 dollars. Looked at pt's list and the only BP medication was lisinopril and I sent it to [facility].  Scheduled pt for a new follow up appt with Tara Hopkins to recheck her bp since her medication was increased.

## 2015-11-12 NOTE — Telephone Encounter (Signed)
Pt called asking for Alisha to call her. She needs to discuss the failure to call in her rx and her next appt. Please advise.

## 2015-11-30 ENCOUNTER — Other Ambulatory Visit: Payer: Self-pay | Admitting: Adult Health

## 2015-11-30 ENCOUNTER — Other Ambulatory Visit: Payer: Self-pay | Admitting: Family

## 2015-12-04 ENCOUNTER — Telehealth: Payer: Self-pay | Admitting: Adult Health

## 2015-12-04 NOTE — Telephone Encounter (Signed)
Left a message for return call.  

## 2015-12-04 NOTE — Telephone Encounter (Signed)
Pt states she received a call, not sure if from you.  It could have been the calls [facility] is doing, but pt has had flu and PNA vaccine. But they are doing Hep C, which she is overdue. Would like a call back, or I will be happy to schedule her a hep c lab. Thank you!

## 2015-12-05 NOTE — Telephone Encounter (Signed)
Left a message for return call.  

## 2015-12-06 NOTE — Telephone Encounter (Signed)
Spoke with pt and advised that she could get her hep c labs when she comes in on 2.7.2017

## 2015-12-06 NOTE — Telephone Encounter (Signed)
Left a message for pt to return call 

## 2015-12-08 ENCOUNTER — Other Ambulatory Visit: Payer: Self-pay | Admitting: Adult Health

## 2015-12-09 NOTE — Telephone Encounter (Signed)
Ok per [place] to refill for 30 days.

## 2015-12-17 ENCOUNTER — Ambulatory Visit: Payer: Self-pay | Admitting: Adult Health

## 2015-12-19 ENCOUNTER — Ambulatory Visit: Payer: BC Managed Care – PPO | Admitting: Adult Health

## 2015-12-26 ENCOUNTER — Ambulatory Visit (INDEPENDENT_AMBULATORY_CARE_PROVIDER_SITE_OTHER): Payer: Medicare Other | Admitting: Adult Health

## 2015-12-26 ENCOUNTER — Encounter: Payer: Self-pay | Admitting: Adult Health

## 2015-12-26 VITALS — BP 150/88 | Temp 98.2°F | Ht 65.0 in | Wt 184.0 lb

## 2015-12-26 DIAGNOSIS — F329 Major depressive disorder, single episode, unspecified: Secondary | ICD-10-CM | POA: Diagnosis not present

## 2015-12-26 DIAGNOSIS — F32A Depression, unspecified: Secondary | ICD-10-CM

## 2015-12-26 DIAGNOSIS — W19XXXS Unspecified fall, sequela: Secondary | ICD-10-CM | POA: Diagnosis not present

## 2015-12-26 MED ORDER — LURASIDONE HCL 80 MG PO TABS: 80.0000 mg | ORAL_TABLET | Freq: Every day | ORAL | Status: DC

## 2015-12-26 MED ORDER — LURASIDONE HCL 20 MG PO TABS: 20.0000 mg | ORAL_TABLET | Freq: Every day | ORAL | Status: DC

## 2015-12-26 NOTE — Progress Notes (Signed)
Pre visit review using our clinic review tool, if applicable. No additional management support is needed unless otherwise documented below in the visit note. 

## 2015-12-26 NOTE — Patient Instructions (Signed)
It was great seeing you again!  I have sent in a prescription for Latuda '80mg'$ . Take this once a day.   Start using your walker.   Someone will call you from physical therapy to make an appointment.   Follow up with me in 2 weeks.

## 2015-12-26 NOTE — Progress Notes (Signed)
Subjective:    Patient ID: Tara Hopkins, female    DOB: 03-Feb-1951, 65 y.o.   MRN: [id number]  HPI  65 year old female who presents to the office today for one month follow up. She reports that she has fallen 3 times in the last three weeks. She denies any injuries but did report bruising to the right flank two weeks ago. That has since resolved. She reports that she loses her balance when she bends over. Denies any dizziness, lightheadedness or LOC.   Her recent falls have made her depressed. She also feels as though her bi polar contributing to her depression. She would like to go back on Latuda. She is tearful in the exam room.   Review of Systems  Constitutional: Negative.   HENT: Negative.   Respiratory: Negative.   Cardiovascular: Negative.   Gastrointestinal: Negative.   Musculoskeletal: Positive for gait problem.  Skin: Positive for color change.  Neurological: Negative.   Psychiatric/Behavioral: Negative.   All other systems reviewed and are negative.  Past Medical History  Diagnosis Date  . Diabetes mellitus without complication ([place])   . Hypercholesteremia   . Peripheral neuropathy ([place])   . Hypertension   . Anxiety   . Depression   . History of blood transfusion   . History of hyperbaric oxygen therapy     pt reports 80 treatments.  . Anemia   . Glaucoma   . Stroke ([place]) 02/2015  . Cataracts, bilateral   . Bipolar 1 disorder [facility])     Social History   Social History  . Marital Status: Married    Spouse Name: N/A  . Number of Children: 3  . Years of Education: 14   Occupational History  . Disbaled    Social History Main Topics  . Smoking status: Current Every Day Smoker -- 0.01 packs/day for 20 years    Types: Cigarettes  . Smokeless tobacco: Never Used  . Alcohol Use: 0.0 oz/week    0 Standard drinks or equivalent per week     Comment: beer every 3 months  . Drug Use: No  . Sexual Activity: Not on file   Other Topics Concern  . Not on  file   Social History Narrative   Lives at home alone.   Right-handed.   No caffeine use.   Oldest of 12 children        Past Surgical History  Procedure Laterality Date  . I&d extremity Right 05/23/2013    Procedure: IRRIGATION AND DEBRIDEMENT RIGHT GREAT TOE;  Surgeon: Mauri Pole, MD;  Location: [facility];  Service: Orthopedics;  Laterality: Right;  . Amputation Right 12/15/2013    Procedure: Right Transmetatarsal Amputation;  Surgeon: Newt Minion, MD;  Location: [place];  Service: Orthopedics;  Laterality: Right;  . Amputation Right 02/23/2014    Procedure: AMPUTATION FOOT;  Surgeon: Newt Minion, MD;  Location: [place];  Service: Orthopedics;  Laterality: Right;  Right Foot Revision Transmetatarsal Amputation    Family History  Problem Relation Age of Onset  . Colon cancer Neg Hx   . Liver cancer Mother   . Lung cancer Mother   . Hypertension Father   . Depression Father   . Depression Mother     No Known Allergies  Current Outpatient Prescriptions on File Prior to Visit  Medication Sig Dispense Refill  . aspirin EC 325 MG EC tablet Take 1 tablet (325 mg total) by mouth daily. 30 tablet 0  . B-D  UF III MINI PEN NEEDLES 31G X 5 MM MISC use as directed 100 each 5  . clonazePAM (KLONOPIN) 0.5 MG tablet take 1 tablet by mouth three times a day if needed 90 tablet 0  . COD LIVER OIL PO Take 1 capsule by mouth daily.    Marland Kitchen gabapentin (NEURONTIN) 400 MG capsule take 2 capsules by mouth twice a day 120 capsule 5  . gentamicin (GARAMYCIN) 0.3 % ophthalmic solution   0  . glimepiride (AMARYL) 4 MG tablet take 1 tablet once daily 30 tablet 1  . glucose blood test strip Use as instructed 100 each 12  . ILEVRO 0.3 % ophthalmic suspension   0  . lamoTRIgine (LAMICTAL) 100 MG tablet take HALF OF A tablet by mouth twice a day 30 tablet 5  . LANTUS 100 UNIT/ML injection INJECT 30 UNITS SUBCUTANEOUSLY ONCE AT BEDTIME 10 vial 5  . latanoprost (XALATAN) 0.005 % ophthalmic solution place 1  drop into both eyes at bedtime 2.5 mL 12  . lisinopril (PRINIVIL,ZESTRIL) 20 MG tablet Take 1 tablet (20 mg total) by mouth daily. 90 tablet 3  . NOVOLOG FLEXPEN 100 UNIT/ML FlexPen INJECT 2-6 UNITS INTO SKIN THREE TIMES DAILY 15 pen 0  . ondansetron (ZOFRAN) 4 MG tablet Take 1 tablet (4 mg total) by mouth every 8 (eight) hours as needed for nausea or vomiting. 30 tablet 1  . ONE TOUCH LANCETS MISC USE TO CHECK BLOOD SUGAR 3 TIMES DAILY 200 each 3  . prednisoLONE acetate (PRED FORTE) 1 % ophthalmic suspension   0  . simvastatin (ZOCOR) 40 MG tablet Take 1 tablet (40 mg total) by mouth at bedtime. 30 tablet 6  . white petrolatum (VASELINE) GEL Apply 1 application topically at bedtime. Apply to both feet    . zinc gluconate 50 MG tablet Take 50 mg by mouth 2 (two) times daily.     No current facility-administered medications on file prior to visit.    BP 150/88 mmHg  Temp(Src) 98.2 F (36.8 C) (Oral)  Ht '5\' 5"'$  (1.651 m)  Wt 184 lb (83.462 kg)  BMI 30.62 kg/m2       Objective:   Physical Exam  Constitutional: She is oriented to person, place, and time. She appears well-developed and well-nourished. No distress.  HENT:  Head: Normocephalic and atraumatic.  Right Ear: External ear normal.  Left Ear: External ear normal.  Nose: Nose normal.  Mouth/Throat: Oropharynx is clear and moist. No oropharyngeal exudate.  Eyes: Conjunctivae and EOM are normal. Pupils are equal, round, and reactive to light. Right eye exhibits no discharge. Left eye exhibits no discharge.  Cardiovascular: Normal rate, normal heart sounds and intact distal pulses.  Exam reveals no gallop and no friction rub.   No murmur heard. Pulmonary/Chest: Effort normal and breath sounds normal. No respiratory distress. She has no wheezes. She has no rales. She exhibits no tenderness.  Abdominal: Soft. Bowel sounds are normal. She exhibits no distension and no mass. There is no tenderness. There is no rebound and no guarding.    Musculoskeletal:  Shuffling gait. Using a single prong cane  Neurological: She is alert and oriented to person, place, and time.  Skin: Skin is warm and dry. No rash noted. She is not diaphoretic. No erythema. No pallor.  No bruising to abdomen.   Psychiatric: She has a normal mood and affect. Her behavior is normal. Judgment and thought content normal.  Nursing note and vitals reviewed.      Assessment &  Plan:  1. Falls, sequela - She needs to start using her walker. Just using her cain is not preventing from falls. Stop bending over to pick up things off the floor.  - Ambulatory referral to Physical Therapy 2. Depression - Likely from bi-polar. She has been on Latuda in the past and felt like she had good results.  - lurasidone (LATUDA) 20 MG TABS tablet; Take 1 tablet (20 mg total) by mouth daily.  Dispense: 30 tablet; Refill: 3 - Follow up in two weeks.  - Consider increasing at that time.

## 2016-01-08 ENCOUNTER — Other Ambulatory Visit: Payer: Self-pay | Admitting: Adult Health

## 2016-01-08 NOTE — Telephone Encounter (Signed)
Ok to refill for one month  

## 2016-01-09 ENCOUNTER — Ambulatory Visit: Payer: Medicare Other | Admitting: Adult Health

## 2016-01-15 ENCOUNTER — Encounter: Payer: Self-pay | Admitting: Adult Health

## 2016-01-15 ENCOUNTER — Ambulatory Visit (INDEPENDENT_AMBULATORY_CARE_PROVIDER_SITE_OTHER): Payer: Medicare Other | Admitting: Adult Health

## 2016-01-15 VITALS — BP 132/80 | Temp 98.2°F | Ht 65.0 in | Wt 178.4 lb

## 2016-01-15 DIAGNOSIS — Z716 Tobacco abuse counseling: Secondary | ICD-10-CM

## 2016-01-15 DIAGNOSIS — Z720 Tobacco use: Secondary | ICD-10-CM | POA: Diagnosis not present

## 2016-01-15 DIAGNOSIS — F3175 Bipolar disorder, in partial remission, most recent episode depressed: Secondary | ICD-10-CM

## 2016-01-15 DIAGNOSIS — F17210 Nicotine dependence, cigarettes, uncomplicated: Secondary | ICD-10-CM | POA: Diagnosis not present

## 2016-01-15 MED ORDER — BUPROPION HCL ER (SR) 150 MG PO TB12: 150.0000 mg | ORAL_TABLET | Freq: Two times a day (BID) | ORAL | Status: DC

## 2016-01-15 NOTE — Patient Instructions (Addendum)
It was great seeing you today.   I am so happy you are ready to quit smoking. I have sent in a prescription for Wellbutrin 150 mg, for the first three days I want you to take 1 tablet in the morning. After that, I want you to take one pill in the morning and one pill in the evening.   Pick a quit date and stick to it!  Follow up with me in one month   Smoking Cessation, Tips for Success If you are ready to quit smoking, congratulations! You have chosen to help yourself be healthier. Cigarettes bring nicotine, tar, carbon monoxide, and other irritants into your body. Your lungs, heart, and blood vessels will be able to work better without these poisons. There are many different ways to quit smoking. Nicotine gum, nicotine patches, a nicotine inhaler, or nicotine nasal spray can help with physical craving. Hypnosis, support groups, and medicines help break the habit of smoking. WHAT THINGS CAN I DO TO MAKE QUITTING EASIER?  Here are some tips to help you quit for good:  Pick a date when you will quit smoking completely. Tell all of your friends and family about your plan to quit on that date.  Do not try to slowly cut down on the number of cigarettes you are smoking. Pick a quit date and quit smoking completely starting on that day.  Throw away all cigarettes.   Clean and remove all ashtrays from your home, work, and car.  On a card, write down your reasons for quitting. Carry the card with you and read it when you get the urge to smoke.  Cleanse your body of nicotine. Drink enough water and fluids to keep your urine clear or pale yellow. Do this after quitting to flush the nicotine from your body.  Learn to predict your moods. Do not let a bad situation be your excuse to have a cigarette. Some situations in your life might tempt you into wanting a cigarette.  Never have "just one" cigarette. It leads to wanting another and another. Remind yourself of your decision to quit.  Change habits  associated with smoking. If you smoked while driving or when feeling stressed, try other activities to replace smoking. Stand up when drinking your coffee. Brush your teeth after eating. Sit in a different chair when you read the paper. Avoid alcohol while trying to quit, and try to drink fewer caffeinated beverages. Alcohol and caffeine may urge you to smoke.  Avoid foods and drinks that can trigger a desire to smoke, such as sugary or spicy foods and alcohol.  Ask people who smoke not to smoke around you.  Have something planned to do right after eating or having a cup of coffee. For example, plan to take a walk or exercise.  Try a relaxation exercise to calm you down and decrease your stress. Remember, you may be tense and nervous for the first 2 weeks after you quit, but this will pass.  Find new activities to keep your hands busy. Play with a pen, coin, or rubber band. Doodle or draw things on paper.  Brush your teeth right after eating. This will help cut down on the craving for the taste of tobacco after meals. You can also try mouthwash.   Use oral substitutes in place of cigarettes. Try using lemon drops, carrots, cinnamon sticks, or chewing gum. Keep them handy so they are available when you have the urge to smoke.  When you have the urge  to smoke, try deep breathing.  Designate your home as a nonsmoking area.  If you are a heavy smoker, ask your health care provider about a prescription for nicotine chewing gum. It can ease your withdrawal from nicotine.  Reward yourself. Set aside the cigarette money you save and buy yourself something nice.  Look for support from others. Join a support group or smoking cessation program. Ask someone at home or at work to help you with your plan to quit smoking.  Always ask yourself, "Do I need this cigarette or is this just a reflex?" Tell yourself, "Today, I choose not to smoke," or "I do not want to smoke." You are reminding yourself of your  decision to quit.  Do not replace cigarette smoking with electronic cigarettes (commonly called e-cigarettes). The safety of e-cigarettes is unknown, and some may contain harmful chemicals.  If you relapse, do not give up! Plan ahead and think about what you will do the next time you get the urge to smoke. HOW WILL I FEEL WHEN I QUIT SMOKING? You may have symptoms of withdrawal because your body is used to nicotine (the addictive substance in cigarettes). You may crave cigarettes, be irritable, feel very hungry, cough often, get headaches, or have difficulty concentrating. The withdrawal symptoms are only temporary. They are strongest when you first quit but will go away within 10-14 days. When withdrawal symptoms occur, stay in control. Think about your reasons for quitting. Remind yourself that these are signs that your body is healing and getting used to being without cigarettes. Remember that withdrawal symptoms are easier to treat than the major diseases that smoking can cause.  Even after the withdrawal is over, expect periodic urges to smoke. However, these cravings are generally short lived and will go away whether you smoke or not. Do not smoke! WHAT RESOURCES ARE AVAILABLE TO HELP ME QUIT SMOKING? Your health care provider can direct you to community resources or hospitals for support, which may include:  Group support.  Education.  Hypnosis.  Therapy.   This information is not intended to replace advice given to you by your health care provider. Make sure you discuss any questions you have with your health care provider.   Document Released: 07/24/2004 Document Revised: 11/16/2014 Document Reviewed: 04/13/2013 Elsevier Interactive Patient Education [employer].

## 2016-01-15 NOTE — Progress Notes (Signed)
Pre visit review using our clinic review tool, if applicable. No additional management support is needed unless otherwise documented below in the visit note. 

## 2016-01-15 NOTE — Progress Notes (Signed)
Subjective:    Patient ID: Tara Hopkins, female    DOB: 12/30/1950, 65 y.o.   MRN: [id number]  HPI  65 year old female who presents for two week follow up after starting Latuda. She reports today " The Anette Guarneri is working great! I am happy, I am not crying all the time, and I al laughing."  She also reports " I am ready to quit smoking." Currently she is smoking 4-5 cigarettes per day.   She has tried gums and patches in the past but has not been able to quit.   Review of Systems  Constitutional: Negative.   Respiratory: Negative.   Cardiovascular: Negative.   Psychiatric/Behavioral: Negative.   All other systems reviewed and are negative.  Past Medical History  Diagnosis Date  . Diabetes mellitus without complication ([place])   . Hypercholesteremia   . Peripheral neuropathy ([place])   . Hypertension   . Anxiety   . Depression   . History of blood transfusion   . History of hyperbaric oxygen therapy     pt reports 80 treatments.  . Anemia   . Glaucoma   . Stroke ([place]) 02/2015  . Cataracts, bilateral   . Bipolar 1 disorder [facility])     Social History   Social History  . Marital Status: Married    Spouse Name: N/A  . Number of Children: 3  . Years of Education: 14   Occupational History  . Disbaled    Social History Main Topics  . Smoking status: Current Every Day Smoker -- 0.01 packs/day for 20 years    Types: Cigarettes  . Smokeless tobacco: Never Used  . Alcohol Use: 0.0 oz/week    0 Standard drinks or equivalent per week     Comment: beer every 3 months  . Drug Use: No  . Sexual Activity: Not on file   Other Topics Concern  . Not on file   Social History Narrative   Lives at home alone.   Right-handed.   No caffeine use.   Oldest of 12 children        Past Surgical History  Procedure Laterality Date  . I&d extremity Right 05/23/2013    Procedure: IRRIGATION AND DEBRIDEMENT RIGHT GREAT TOE;  Surgeon: Mauri Pole, MD;  Location: [facility];  Service:  Orthopedics;  Laterality: Right;  . Amputation Right 12/15/2013    Procedure: Right Transmetatarsal Amputation;  Surgeon: Newt Minion, MD;  Location: [place];  Service: Orthopedics;  Laterality: Right;  . Amputation Right 02/23/2014    Procedure: AMPUTATION FOOT;  Surgeon: Newt Minion, MD;  Location: [place];  Service: Orthopedics;  Laterality: Right;  Right Foot Revision Transmetatarsal Amputation    Family History  Problem Relation Age of Onset  . Colon cancer Neg Hx   . Liver cancer Mother   . Lung cancer Mother   . Hypertension Father   . Depression Father   . Depression Mother     No Known Allergies  Current Outpatient Prescriptions on File Prior to Visit  Medication Sig Dispense Refill  . aspirin EC 325 MG EC tablet Take 1 tablet (325 mg total) by mouth daily. 30 tablet 0  . B-D UF III MINI PEN NEEDLES 31G X 5 MM MISC use as directed 100 each 5  . clonazePAM (KLONOPIN) 0.5 MG tablet take 1 tablet by mouth three times a day if needed 90 tablet 0  . COD LIVER OIL PO Take 1 capsule by mouth daily.    Marland Kitchen  gabapentin (NEURONTIN) 400 MG capsule take 2 capsules by mouth twice a day 120 capsule 5  . gentamicin (GARAMYCIN) 0.3 % ophthalmic solution   0  . glimepiride (AMARYL) 4 MG tablet take 1 tablet once daily 30 tablet 1  . glucose blood test strip Use as instructed 100 each 12  . ILEVRO 0.3 % ophthalmic suspension   0  . lamoTRIgine (LAMICTAL) 100 MG tablet take HALF OF A tablet by mouth twice a day 30 tablet 5  . LANTUS 100 UNIT/ML injection INJECT 30 UNITS SUBCUTANEOUSLY ONCE AT BEDTIME 10 vial 5  . latanoprost (XALATAN) 0.005 % ophthalmic solution place 1 drop into both eyes at bedtime 2.5 mL 12  . lisinopril (PRINIVIL,ZESTRIL) 20 MG tablet Take 1 tablet (20 mg total) by mouth daily. 90 tablet 3  . lurasidone (LATUDA) 20 MG TABS tablet Take 1 tablet (20 mg total) by mouth daily. 30 tablet 3  . NOVOLOG FLEXPEN 100 UNIT/ML FlexPen INJECT 2-6 UNITS INTO SKIN THREE TIMES DAILY 15 pen 0    . ondansetron (ZOFRAN) 4 MG tablet Take 1 tablet (4 mg total) by mouth every 8 (eight) hours as needed for nausea or vomiting. 30 tablet 1  . ONE TOUCH LANCETS MISC USE TO CHECK BLOOD SUGAR 3 TIMES DAILY 200 each 3  . prednisoLONE acetate (PRED FORTE) 1 % ophthalmic suspension   0  . simvastatin (ZOCOR) 40 MG tablet Take 1 tablet (40 mg total) by mouth at bedtime. 30 tablet 6  . white petrolatum (VASELINE) GEL Apply 1 application topically at bedtime. Apply to both feet    . zinc gluconate 50 MG tablet Take 50 mg by mouth 2 (two) times daily.     No current facility-administered medications on file prior to visit.    BP 132/80 mmHg  Temp(Src) 98.2 F (36.8 C) (Oral)  Ht '5\' 5"'$  (1.651 m)  Wt 178 lb 6.4 oz (80.922 kg)  BMI 29.69 kg/m2       Objective:   Physical Exam  Constitutional: She is oriented to person, place, and time. She appears well-developed and well-nourished. No distress.  Cardiovascular: Normal rate, regular rhythm, normal heart sounds and intact distal pulses.  Exam reveals no gallop and no friction rub.   No murmur heard. Pulmonary/Chest: Effort normal and breath sounds normal. No respiratory distress. She has no wheezes. She has no rales. She exhibits no tenderness.  Musculoskeletal:  Walks with slow shuffling gait. Has a cane  Neurological: She is alert and oriented to person, place, and time.  Skin: Skin is warm and dry. No rash noted. She is not diaphoretic. No erythema. No pallor.  Psychiatric: She has a normal mood and affect. Her behavior is normal. Judgment and thought content normal.  Nursing note and vitals reviewed.     Assessment & Plan:  1. Bipolar disorder, in partial remission, most recent episode depressed ([place]) - Appears to be approving with Latuda. She denies any SI or HI - buPROPion (WELLBUTRIN SR) 150 MG 12 hr tablet; Take 1 tablet (150 mg total) by mouth 2 (two) times daily.  Dispense: 60 tablet; Refill: 1  2. Encounter for smoking cessation  counseling - Advised to take one pill for the first three days and then one pill in the morning and one at night there after.  - buPROPion (WELLBUTRIN SR) 150 MG 12 hr tablet; Take 1 tablet (150 mg total) by mouth 2 (two) times daily.  Dispense: 60 tablet; Refill: 1 -spent about 5 minutes discussing  options  - Advised to pick a quit date and stick with it.  - She can use gums, patches, or lozenges while taking this medication.  - Follow up in one month

## 2016-01-20 DIAGNOSIS — E113311 Type 2 diabetes mellitus with moderate nonproliferative diabetic retinopathy with macular edema, right eye: Secondary | ICD-10-CM | POA: Diagnosis not present

## 2016-01-20 DIAGNOSIS — H35371 Puckering of macula, right eye: Secondary | ICD-10-CM | POA: Diagnosis not present

## 2016-01-24 ENCOUNTER — Encounter (HOSPITAL_BASED_OUTPATIENT_CLINIC_OR_DEPARTMENT_OTHER): Payer: Medicare Other | Attending: Internal Medicine

## 2016-01-24 DIAGNOSIS — Z6829 Body mass index (BMI) 29.0-29.9, adult: Secondary | ICD-10-CM | POA: Diagnosis not present

## 2016-01-24 DIAGNOSIS — L97811 Non-pressure chronic ulcer of other part of right lower leg limited to breakdown of skin: Secondary | ICD-10-CM | POA: Diagnosis not present

## 2016-01-24 DIAGNOSIS — E663 Overweight: Secondary | ICD-10-CM | POA: Insufficient documentation

## 2016-01-24 DIAGNOSIS — Z9181 History of falling: Secondary | ICD-10-CM | POA: Diagnosis not present

## 2016-01-24 DIAGNOSIS — E11622 Type 2 diabetes mellitus with other skin ulcer: Secondary | ICD-10-CM | POA: Diagnosis not present

## 2016-02-02 ENCOUNTER — Other Ambulatory Visit: Payer: Self-pay | Admitting: Adult Health

## 2016-02-04 ENCOUNTER — Other Ambulatory Visit: Payer: Self-pay | Admitting: Adult Health

## 2016-02-04 NOTE — Telephone Encounter (Signed)
Last refilled 01/08/16 for #90 with 0 refills. Ok to refill?

## 2016-02-05 NOTE — Telephone Encounter (Signed)
Rx called in as directed.   

## 2016-02-05 NOTE — Telephone Encounter (Signed)
Ok to refill for one month  

## 2016-02-13 ENCOUNTER — Encounter (HOSPITAL_BASED_OUTPATIENT_CLINIC_OR_DEPARTMENT_OTHER): Payer: Medicare Other | Attending: Internal Medicine

## 2016-02-13 DIAGNOSIS — E11621 Type 2 diabetes mellitus with foot ulcer: Secondary | ICD-10-CM | POA: Diagnosis not present

## 2016-02-13 DIAGNOSIS — L97521 Non-pressure chronic ulcer of other part of left foot limited to breakdown of skin: Secondary | ICD-10-CM | POA: Diagnosis not present

## 2016-02-13 DIAGNOSIS — E1151 Type 2 diabetes mellitus with diabetic peripheral angiopathy without gangrene: Secondary | ICD-10-CM | POA: Insufficient documentation

## 2016-02-18 ENCOUNTER — Ambulatory Visit: Payer: Medicare Other | Attending: Adult Health | Admitting: Physical Therapy

## 2016-02-18 ENCOUNTER — Ambulatory Visit: Payer: Medicare Other | Admitting: Physical Therapy

## 2016-02-18 DIAGNOSIS — R293 Abnormal posture: Secondary | ICD-10-CM | POA: Insufficient documentation

## 2016-02-18 DIAGNOSIS — M6281 Muscle weakness (generalized): Secondary | ICD-10-CM | POA: Diagnosis not present

## 2016-02-18 DIAGNOSIS — R2689 Other abnormalities of gait and mobility: Secondary | ICD-10-CM

## 2016-02-18 NOTE — Therapy (Signed)
[facility] [address] [place], [place], [address] Phone: [phone]   Fax:  [phone]  Physical Therapy Evaluation  Patient Details  Name: Tara Hopkins MRN: [id number] Date of Birth: [birth year] Referring Provider: [name] MD  Encounter Date: 02/18/2016      PT End of Session - 02/18/16 1555    Visit Number 1   Number of Visits 16   Date for PT Re-Evaluation 04/14/16   PT Start Time 1500   PT Stop Time 1542   PT Time Calculation (min) 42 min   Equipment Utilized During Treatment Gait belt   Activity Tolerance Patient tolerated treatment well   Behavior During Therapy [facility] for tasks assessed/performed      Past Medical History  Diagnosis Date  . Diabetes mellitus without complication ([place])   . Hypercholesteremia   . Peripheral neuropathy ([place])   . Hypertension   . Anxiety   . Depression   . History of blood transfusion   . History of hyperbaric oxygen therapy     pt reports 80 treatments.  . Anemia   . Glaucoma   . Stroke ([place]) 02/2015  . Cataracts, bilateral   . Bipolar 1 disorder [facility])     Past Surgical History  Procedure Laterality Date  . I&d extremity Right 05/23/2013    Procedure: IRRIGATION AND DEBRIDEMENT RIGHT GREAT TOE;  Surgeon: Mauri Pole, MD;  [facility];  Service: Orthopedics;  Laterality: Right;  . Amputation Right 12/15/2013    Procedure: Right Transmetatarsal Amputation;  Surgeon: Newt Minion, MD;  Location: [place];  Service: Orthopedics;  Laterality: Right;  . Amputation Right 02/23/2014    Procedure: AMPUTATION FOOT;  Surgeon: Newt Minion, MD;  Location: [place];  Service: Orthopedics;  Laterality: Right;  Right Foot Revision Transmetatarsal Amputation    There were no vitals filed for this visit.       Subjective Assessment - 02/18/16 1505    Subjective Pt is a 65 y/o female who presents to OPPT for impaired mobility following CVA.  Pt reports she had CVA  in April 2016 with good recovery, but feels she has had progressive decline resulting in 10-15 falls (without injury).  Pt states she "tried to do leg exercises" at d/c and walk around neighborhood, but no consistent  exercise program.   Pertinent History DM, HLD, peripheral neuropathy, HTN, anxiety, depression, glaucoma, cataracts, Bipolar Disorder   Limitations Standing;Walking;House hold activities   Diagnostic tests MRI April 2016: CVA of right lenticular nucleus into the posterior corona   Patient Stated Goals improve balance, walk better   Currently in Pain? No/denies            [facility] PT Assessment - 02/18/16 1508    Assessment   Medical Diagnosis CVA, falls   Referring Provider [name] MD   Onset Date/Surgical Date --  April 2016   Hand Dominance Right   Next MD Visit 1 month   Prior Therapy CIR in April 2016   Precautions   Precautions Fall   Restrictions   Weight Bearing Restrictions No   Balance Screen   Has the patient fallen in the past 6 months Yes   How many times? 10-15   Has the patient had a decrease in activity level because of a fear of falling?  No   Is the patient reluctant to leave their home because of a fear of falling?  No   Home Environment   Living Environment  Private residence   Living Arrangements Alone   Available Help at Discharge Friend(s);Family;Available PRN/intermittently   Type of Home Apartment   Home Access Stairs to enter   Entrance Stairs-Number of Steps 3   Entrance Stairs-Rails Can reach both;Left;Right   Home Layout One level   [place] - single point;Wheelchair - [employer] - 4 wheels;Walker - 2 wheels;Wheelchair - power   Prior Function   Level of Independence Requires assistive device for independence   Vocation Retired   [employer] retired from [employer] for [employer]   Observation/Other Assessments   Observations --   Focus on Therapeutic Outcomes  ([place])  51 (49% limited; predicted 37% limited)   Activities of Balance Confidence Scale (ABC Scale)  21.3%   Posture/Postural Control   Posture/Postural Control Postural limitations   Postural Limitations Rounded Shoulders;Forward head   Strength   Overall Strength Comments tested in sitting   Strength Assessment Site Hip;Knee;Ankle   Right Hip Flexion 3+/5   Right Hip ABduction 4/5   Left Hip Flexion 3/5   Left Hip ABduction 3+/5   Right/Left Knee Right;Left   Right Knee Flexion 4/5   Right Knee Extension 5/5   Left Knee Flexion 3+/5   Left Knee Extension 4/5   Right Ankle Dorsiflexion 4/5   Left Ankle Dorsiflexion 3+/5   Transfers   Transfers Sit to Stand;Stand to Sit   Sit to Stand 5: Supervision;With upper extremity assist   Stand to Sit With upper extremity assist;Uncontrolled descent;5: Supervision   Ambulation/Gait   Ambulation/Gait Yes   Ambulation/Gait Assistance 4: Min assist   Ambulation/Gait Assistance Details LOB x 1 needing min A to correct   Ambulation Distance (Feet) 100 Feet   Assistive device Straight cane   Gait Pattern Decreased stance time - left;Decreased step length - right;Festinating;Shuffle   Gait velocity 1.27 ft/sec  25.75 sec   Gait Comments pt with minimal use of cane with amb; cane without rubber tip; recommended pt get new rubber tip for safe mobility with cane but at this time feel pt is safest with RW at all times   Standardized Balance Assessment   Standardized Balance Assessment Berg Balance Test;Timed Up and Go Test   Berg Balance Test   Sit to Stand Able to stand  independently using hands   Standing Unsupported Able to stand safely 2 minutes   Sitting with Back Unsupported but Feet Supported on Floor or Stool Able to sit safely and securely 2 minutes   Stand to Sit Controls descent by using hands   Transfers Able to transfer safely, definite need of hands   Standing Unsupported with Eyes Closed Able to stand 10 seconds with supervision    Standing Ubsupported with Feet Together Needs help to attain position and unable to hold for 15 seconds   From Standing, Reach Forward with Outstretched Arm Reaches forward but needs supervision   From Standing Position, Pick up Object from Floor Unable to try/needs assist to keep balance  LOB returning to upright   From Standing Position, Turn to Look Behind Over each Shoulder Turn sideways only but maintains balance   Turn 360 Degrees Needs assistance while turning   Standing Unsupported, Alternately Place Feet on Step/Stool Able to complete >2 steps/needs minimal assist   Standing Unsupported, One Foot in Front Able to take small step independently and hold 30 seconds   Standing on One Leg Tries to lift leg/unable to hold 3 seconds but remains  standing independently   Total Score 27   Timed Up and Go Test   Normal TUG (seconds) 40.38   TUG Comments with SPC in hand                           PT Education - 02/18/16 1555    Education provided Yes   Education Details clinical findings; POC, goals of care   Person(s) Educated Patient   Methods Explanation   Comprehension Verbalized understanding          PT Short Term Goals - 02/18/16 1559    PT SHORT TERM GOAL #1   Title independent with HEP (03/17/16)   Time 4   Period Weeks   PT SHORT TERM GOAL #2   Title improve BERG balance score to >/= 35/56 for improved balance (03/17/16)   PT SHORT TERM GOAL #3   Title improve timed up and go to < 34 sec for improved function with LRAD (03/17/16)   PT SHORT TERM GOAL #4   Title ambulate > 200' with RW with supervision for improved function and safe mobility (03/17/16)   PT SHORT TERM GOAL #5   Title improve gait velocity to > 1.8 ft/sec for improved function and decreased fall risk (03/17/16)           PT Long Term Goals - 02/18/16 1601    PT LONG TERM GOAL #1   Title verbalize understanding of risk factors/warning signs of CVA (04/14/16)   Time 8   Period Weeks    PT LONG TERM GOAL #2   Title improve BERG balance score to >/= 40/56 for improved balance (04/14/16)   PT LONG TERM GOAL #3   Title improve timed up and go to < 27 sec for improved function with LRAD (04/14/16)   PT LONG TERM GOAL #4   Title ambulate > 500' with LRAD on indoor/paved outdoor surfaces with supervision for improved function and safe mobility (04/14/16)   PT LONG TERM GOAL #5   Title improve gait velocity to > 2.3 ft/sec for improved function (04/14/16)               Plan - 02/18/16 1555    Clinical Impression Statement Pt is a 65 y/o female who presents to OPPT for moderate complexity evaluation of falls and balance deficits.  Pt with hx of 10-15 falls and at this time recommend use of RW at all times for safe mobility.  Pt demonstrates decreased strength, balance and gait abnormalities affecting safe functional mobility.  Will benefit from PT to maximize function and mobility.   Rehab Potential Good   PT Frequency 2x / week   PT Duration 8 weeks   PT Treatment/Interventions ADLs/Self Care Home Management;Patient/family education;Neuromuscular re-education;Balance training;Therapeutic exercise;Therapeutic activities;Functional mobility training;Stair training;Gait training;DME Instruction;Vestibular;Orthotic Fit/Training   PT Next Visit Plan establish HEP for balance/strengthening   Consulted and Agree with Plan of Care Patient      Patient will benefit from skilled therapeutic intervention in order to improve the following deficits and impairments:  Abnormal gait, Decreased balance, Decreased coordination, Decreased strength, Difficulty walking, Decreased cognition, Decreased mobility, Decreased knowledge of use of DME  Visit Diagnosis: Other abnormalities of gait and mobility - Plan: PT plan of care cert/re-cert  Muscle weakness (generalized) - Plan: PT plan of care cert/re-cert  Abnormal posture - Plan: PT plan of care cert/re-cert      G-Codes - 09/02/84 1604  Functional Assessment Tool Used BERG 27/56   Functional Limitation Mobility: Walking and moving around   Mobility: Walking and Moving Around Current Status [phone]) At least 20 percent but less than 40 percent impaired, limited or restricted   Mobility: Walking and Moving Around Goal Status [phone]) At least 1 percent but less than 20 percent impaired, limited or restricted       Problem List Patient Active Problem List   Diagnosis Date Noted  . Bipolar disorder ([place]) 09/09/2015  . Acute gastroenteritis 03/08/2015  . Acute renal failure ([place]) 03/01/2015  . Left hemiparesis ([place]) 02/20/2015  . Cocaine abuse   . Stroke ([place]) 02/15/2015  . 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 Osteomyelitis of foot, right, acute ([place]) 02/23/2014  . 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 Diabetic foot infection with ulcer and abcess, right 05/21/2013  . DM type 2 with diabetic peripheral neuropathy ([place]) 05/21/2013  . Essential hypertension 05/21/2013  . Depression 05/21/2013  . Anemia 05/21/2013  . Renal insufficiency 05/21/2013  . Diabetic peripheral neuropathy associated with type 2 diabetes mellitus ([place]) 10/17/2012   Laureen Abrahams, PT, DPT 02/18/2016 4:06 PM  [facility] [address] [place], [place], [address] Phone: [phone]   Fax:  [phone]  Name: Tara Hopkins MRN: [id number] Date of Birth: Aug 21, 1951

## 2016-02-24 ENCOUNTER — Ambulatory Visit (HOSPITAL_COMMUNITY)
Admission: RE | Admit: 2016-02-24 | Discharge: 2016-02-24 | Disposition: A | Discharge status: Home / Self Care | Payer: Medicare Other | Source: Ambulatory Visit | Attending: Surgery | Admitting: Surgery

## 2016-02-24 ENCOUNTER — Other Ambulatory Visit: Payer: Self-pay | Admitting: Internal Medicine

## 2016-02-24 DIAGNOSIS — E7800 Pure hypercholesterolemia, unspecified: Secondary | ICD-10-CM | POA: Insufficient documentation

## 2016-02-24 DIAGNOSIS — L97521 Non-pressure chronic ulcer of other part of left foot limited to breakdown of skin: Secondary | ICD-10-CM

## 2016-02-24 DIAGNOSIS — I10 Essential (primary) hypertension: Secondary | ICD-10-CM | POA: Diagnosis not present

## 2016-02-24 DIAGNOSIS — E1142 Type 2 diabetes mellitus with diabetic polyneuropathy: Secondary | ICD-10-CM | POA: Insufficient documentation

## 2016-02-25 ENCOUNTER — Ambulatory Visit: Payer: Medicare Other | Admitting: Physical Therapy

## 2016-02-27 DIAGNOSIS — E1151 Type 2 diabetes mellitus with diabetic peripheral angiopathy without gangrene: Secondary | ICD-10-CM | POA: Diagnosis not present

## 2016-02-27 DIAGNOSIS — L97521 Non-pressure chronic ulcer of other part of left foot limited to breakdown of skin: Secondary | ICD-10-CM | POA: Diagnosis not present

## 2016-02-27 DIAGNOSIS — E11621 Type 2 diabetes mellitus with foot ulcer: Secondary | ICD-10-CM | POA: Diagnosis not present

## 2016-03-02 ENCOUNTER — Ambulatory Visit: Payer: Medicare Other | Admitting: Physical Therapy

## 2016-03-04 ENCOUNTER — Ambulatory Visit: Payer: Medicare Other | Admitting: Physical Therapy

## 2016-03-07 ENCOUNTER — Other Ambulatory Visit: Payer: Self-pay | Admitting: Adult Health

## 2016-03-10 ENCOUNTER — Ambulatory Visit: Payer: Medicare Other | Attending: Adult Health | Admitting: Physical Therapy

## 2016-03-10 ENCOUNTER — Encounter: Payer: Self-pay | Admitting: Physical Therapy

## 2016-03-10 DIAGNOSIS — R293 Abnormal posture: Secondary | ICD-10-CM | POA: Insufficient documentation

## 2016-03-10 DIAGNOSIS — R2689 Other abnormalities of gait and mobility: Secondary | ICD-10-CM | POA: Diagnosis not present

## 2016-03-10 DIAGNOSIS — M6281 Muscle weakness (generalized): Secondary | ICD-10-CM | POA: Insufficient documentation

## 2016-03-10 NOTE — Telephone Encounter (Signed)
Ok to refill 

## 2016-03-10 NOTE — Patient Instructions (Signed)
Sit to Stand: Head Upright    With head upright, stand up slowly to FULL upright position. Place hands on thighs and slide them down to knees BOW down and stick buttock out over the chair. Squat as low as possible to sit under control.  Repeat ___10_ times  Copyright  VHI. All rights reserved.  Hip Abduction (Standing)    Stand with support. Stand up straight.  Lift right leg out to side, keeping toe forward. Then bring feet together. Repeat with left leg.  Keep alternating legs.  Repeat _10__ times on each leg or 20 reps total.  Copyright  VHI. All rights reserved.  HIP / KNEE: Extension - Standing    Squeeze glutes. Raise and lift leg backward. Keep knee straight or slightly bent. Alternate legs. __10_ reps on each leg or 20 reps total. Hold onto a support.  Copyright  VHI. All rights reserved.  Hip Flexion, Knee Straight    Hold support. Lift right straight leg forward. Alternate legs kicking forward.  Repeat _10___ times on each leg or 20 times total.   Copyright  VHI. All rights reserved.  Feet Partial Heel-Toe, Varied Arm Positions - Eyes Open    Stand with support of counter or table on one side and chair back on other. Place red band on floor in front of feet. Step right leg over band then try to let go and balance. Hold 5 seconds. Then step right leg back behind the band. Repeat with left leg.  Alternate legs and do 10 times on each leg.   Standing step to side. Stand facing counter with red band on right side and green band on left side.  Step right leg over the band turning foot to face to the right. Your feet will be in L position. try to let go and balance. Hold 5 seconds. Then step right leg back behind the band. Repeat with left leg.  Alternate legs and do 10 times on each leg.   Copyright  VHI. All rights reserved.

## 2016-03-10 NOTE — Telephone Encounter (Signed)
Ok to refill for one month  

## 2016-03-10 NOTE — Telephone Encounter (Signed)
Rx faxed

## 2016-03-10 NOTE — Therapy (Addendum)
[facility] [address] [place], [place], [address] Phone: [phone]   Fax:  [phone]  Physical Therapy Treatment  Patient Details  Name: Tara Hopkins MRN: [id number] Date of Birth: Sep 12, 1951 Referring Provider: [name] MD  Encounter Date: 03/10/2016      PT End of Session - 03/10/16 2222    Visit Number 2   Number of Visits 16   Date for PT Re-Evaluation 04/14/16   PT Start Time 9622   PT Stop Time 1620   PT Time Calculation (min) 50 min   Equipment Utilized During Treatment Gait belt   Activity Tolerance Patient tolerated treatment well   Behavior During Therapy [facility] for tasks assessed/performed      Past Medical History  Diagnosis Date  . Diabetes mellitus without complication ([place])   . Hypercholesteremia   . Peripheral neuropathy ([place])   . Hypertension   . Anxiety   . Depression   . History of blood transfusion   . History of hyperbaric oxygen therapy     pt reports 80 treatments.  . Anemia   . Glaucoma   . Stroke ([place]) 02/2015  . Cataracts, bilateral   . Bipolar 1 disorder [facility])     Past Surgical History  Procedure Laterality Date  . I&d extremity Right 05/23/2013    Procedure: IRRIGATION AND DEBRIDEMENT RIGHT GREAT TOE;  Surgeon: Mauri Pole, MD;  [facility];  Service: Orthopedics;  Laterality: Right;  . Amputation Right 12/15/2013    Procedure: Right Transmetatarsal Amputation;  Surgeon: Newt Minion, MD;  Location: [place];  Service: Orthopedics;  Laterality: Right;  . Amputation Right 02/23/2014    Procedure: AMPUTATION FOOT;  Surgeon: Newt Minion, MD;  Location: [place];  Service: Orthopedics;  Laterality: Right;  Right Foot Revision Transmetatarsal Amputation    There were no vitals filed for this visit.      Subjective Assessment - 03/10/16 1537    Subjective No falls.   Pertinent History DM, HLD, peripheral neuropathy, HTN, anxiety, depression, glaucoma,  cataracts, Bipolar Disorder   Limitations Standing;Walking;House hold activities   Diagnostic tests MRI April 2016: CVA of right lenticular nucleus into the posterior corona   Patient Stated Goals improve balance, walk better   Currently in Pain? No/denies      Therapeutic Exercise:  Sit to Stand: Head Upright    With head upright, stand up slowly to FULL upright position.  Place hands on thighs and slide them down to knees BOW down and stick buttock out over the chair. Squat as low as possible to sit under control.  Repeat ___10_ times  Copyright  VHI. All rights reserved.  Hip Abduction (Standing)    Stand with support. Stand up straight. Lift right leg out to side, keeping toe forward. Then bring feet together. Repeat with left leg.  Keep alternating legs.  Repeat _10__ times on each leg or 20 reps total.  Copyright  VHI. All rights reserved.  HIP / KNEE: Extension - Standing    Squeeze glutes. Raise and lift leg backward. Keep knee straight or slightly bent. Alternate legs. __10_ reps on each leg or 20 reps total. Hold onto a support.  Copyright  VHI. All rights reserved.  Hip Flexion, Knee Straight    Hold support. Lift right straight leg forward. Alternate legs kicking forward.  Repeat _10___ times on each leg or 20 times total.   Copyright  VHI. All rights reserved.  Feet Partial Heel-Toe, Varied  Arm Positions - Eyes Open    Stand with support of counter or table on one side and chair back on other. Place red band on floor in front of feet.  Step right leg over band then try to let go and balance. Hold 5 seconds. Then step right leg back behind the band.  Repeat with left leg.  Alternate legs and do 10 times on each leg.  Standing step to side.  Stand facing counter with red band on right side and green band on left side.  Step right leg over the band turning foot to face to the right. Your feet will be in L position. try to let go and balance. Hold 5  seconds. Then step right leg back behind the band.  Repeat with left leg.  Alternate legs and do 10 times on each leg.    Copyright  VHI. All rights reserved.   Patient performed 10 min on SciFit level 1 with BUE & BLE while PT finished handout for HEP with PT supervising.  Gait Training: PT changed shoe lacing for partial foot amputation with prosthetic device to improve suspension of shoe with less heel slippage. Patient ambulated 100' X 2 with single point cane with quad tip with supervision & cues on step length /posture.         Late entry G code: 04/10/2016       Functional Assessment Tool Used BERG 27/56       Functional Limitation Mobility: Walking and moving around               Mobility: Walking and Moving Around Goal Status [phone]) CI       Mobility: Walking and Moving Around Discharge Status [phone]) CJ                        PT Education - 03/10/16 1530    Education provided Yes   Education Details balance HEP, quad tip for cane,    Person(s) Educated Patient   Methods Explanation;Demonstration;Tactile cues;Verbal cues;Handout   Comprehension Verbalized understanding;Returned demonstration;Verbal cues required;Tactile cues required;Need further instruction          PT Short Term Goals - 03/10/16 2222    PT SHORT TERM GOAL #1   Title independent with HEP (03/17/16)   Time 4   Period Weeks   Status On-going   PT SHORT TERM GOAL #2   Title improve BERG balance score to >/= 35/56 for improved balance (03/17/16)   Status On-going   PT SHORT TERM GOAL #3   Title improve timed up and go to < 34 sec for improved function with LRAD (03/17/16)   Status On-going   PT SHORT TERM GOAL #4   Title ambulate > 200' with RW with supervision for improved function and safe mobility (03/17/16)   Status On-going   PT SHORT TERM GOAL #5   Title improve gait velocity to > 1.8 ft/sec for improved function and decreased fall risk (03/17/16)   Status On-going             PT Long Term Goals - 03/10/16 2223    PT LONG TERM GOAL #1   Title verbalize understanding of risk factors/warning signs of CVA (04/14/16)   Time 8   Period Weeks   Status On-going   PT LONG TERM GOAL #2   Title improve BERG balance score to >/= 40/56 for improved balance (04/14/16)   Status On-going   PT LONG TERM  GOAL #3   Title improve timed up and go to < 27 sec for improved function with LRAD (04/14/16)   Status On-going   PT LONG TERM GOAL #4   Title ambulate > 500' with LRAD on indoor/paved outdoor surfaces with supervision for improved function and safe mobility (04/14/16)   Status On-going   PT LONG TERM GOAL #5   Title improve gait velocity to > 2.3 ft/sec for improved function (04/14/16)   Status On-going               Plan - 03/10/16 2223    Clinical Impression Statement Patient appears to understand initial HEP for balance enough to attempt at home but will need additional instruction due to cognitive issues. Use of quad tip on her cane improved clearance in gait with slightly longer step length.    Rehab Potential Good   PT Frequency 2x / week   PT Duration 8 weeks   PT Treatment/Interventions ADLs/Self Care Home Management;Patient/family education;Neuromuscular re-education;Balance training;Therapeutic exercise;Therapeutic activities;Functional mobility training;Stair training;Gait training;DME Instruction;Vestibular;Orthotic Fit/Training   PT Next Visit Plan check 7 update HEP for balance/strengthening   Consulted and Agree with Plan of Care Patient      Patient will benefit from skilled therapeutic intervention in order to improve the following deficits and impairments:  Abnormal gait, Decreased balance, Decreased coordination, Decreased strength, Difficulty walking, Decreased cognition, Decreased mobility, Decreased knowledge of use of DME  Visit Diagnosis: Other abnormalities of gait and mobility  Muscle weakness (generalized)  Abnormal  posture     Problem List Patient Active Problem List   Diagnosis Date Noted  . Bipolar disorder ([place]) 09/09/2015  . Acute gastroenteritis 03/08/2015  . Acute renal failure ([place]) 03/01/2015  . Left hemiparesis ([place]) 02/20/2015  . Cocaine abuse   . Stroke ([place]) 02/15/2015  . 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 Osteomyelitis of foot, right, acute ([place]) 02/23/2014  . Status post transmetatarsal amputation of right foot ([place]) 12/15/2013  . Diabetic foot infection with ulcer and abcess, right 05/21/2013  . DM type 2 with diabetic peripheral neuropathy ([place]) 05/21/2013  . Essential hypertension 05/21/2013  . Depression 05/21/2013  . Anemia 05/21/2013  . Renal insufficiency 05/21/2013  . Diabetic peripheral neuropathy associated with type 2 diabetes mellitus ([place]) 10/17/2012    , PT, DPT 03/10/2016, 10:26 PM  [facility] [address] [place], [place], [address] Phone: [phone]   Fax:  [phone]  Name: Tara Hopkins MRN: [id number] Date of Birth: [birth year]       PHYSICAL THERAPY DISCHARGE SUMMARY  Visits from Start of Care: 2  Current functional level related to goals / functional outcomes: See above; pt canceled due to transportation issues   Remaining deficits: Unknown as pt did not return   Education / Equipment: HEP  Plan: Patient agrees to discharge.  Patient goals were not met. Patient is being discharged due to not returning since the last visit.  ?????    Laureen Abrahams, PT, DPT 04/10/2016 10:45 AM  [facility] [address] [place], [place] [address]  [phone] (office) [phone] (fax)

## 2016-03-12 ENCOUNTER — Ambulatory Visit: Payer: Medicare Other | Admitting: Physical Therapy

## 2016-03-16 ENCOUNTER — Ambulatory Visit: Payer: Medicare Other | Admitting: Physical Therapy

## 2016-03-18 ENCOUNTER — Ambulatory Visit: Payer: Self-pay | Admitting: Physical Therapy

## 2016-03-25 ENCOUNTER — Telehealth: Payer: Self-pay | Admitting: Adult Health

## 2016-03-25 NOTE — Telephone Encounter (Signed)
Need new Rx for insulin supplies and alcohol swabs    [employer]  [id number]

## 2016-03-26 ENCOUNTER — Other Ambulatory Visit: Payer: Self-pay | Admitting: General Practice

## 2016-03-26 ENCOUNTER — Other Ambulatory Visit: Payer: Self-pay

## 2016-03-26 MED ORDER — INSULIN PEN NEEDLE 31G X 5 MM MISC: Status: DC

## 2016-03-26 MED ORDER — GLUCOSE BLOOD VI STRP: ORAL_STRIP | Status: DC

## 2016-03-26 MED ORDER — ONETOUCH LANCETS MISC: Status: DC

## 2016-03-26 NOTE — Telephone Encounter (Signed)
Insulin supplies sent in to pharmacy. Thanks!

## 2016-03-27 ENCOUNTER — Other Ambulatory Visit: Payer: Self-pay | Admitting: Adult Health

## 2016-03-30 ENCOUNTER — Ambulatory Visit: Payer: Self-pay | Admitting: Physical Therapy

## 2016-04-01 ENCOUNTER — Other Ambulatory Visit: Payer: Self-pay | Admitting: Adult Health

## 2016-04-01 NOTE — Telephone Encounter (Signed)
Ok to refill 

## 2016-04-01 NOTE — Telephone Encounter (Signed)
Ok to refill, 90 pills, 1 refill  

## 2016-04-10 ENCOUNTER — Other Ambulatory Visit: Payer: Self-pay | Admitting: Adult Health

## 2016-04-10 NOTE — Telephone Encounter (Signed)
Ok to refill for one month  

## 2016-04-10 NOTE — Telephone Encounter (Signed)
Ok to refill 

## 2016-04-21 DIAGNOSIS — H35371 Puckering of macula, right eye: Secondary | ICD-10-CM | POA: Diagnosis not present

## 2016-04-21 DIAGNOSIS — E113411 Type 2 diabetes mellitus with severe nonproliferative diabetic retinopathy with macular edema, right eye: Secondary | ICD-10-CM | POA: Diagnosis not present

## 2016-04-21 DIAGNOSIS — E113492 Type 2 diabetes mellitus with severe nonproliferative diabetic retinopathy without macular edema, left eye: Secondary | ICD-10-CM | POA: Diagnosis not present

## 2016-05-05 ENCOUNTER — Other Ambulatory Visit: Payer: Self-pay | Admitting: General Practice

## 2016-05-05 MED ORDER — INSULIN ASPART 100 UNIT/ML FLEXPEN: PEN_INJECTOR | SUBCUTANEOUS | Status: DC

## 2016-05-15 ENCOUNTER — Other Ambulatory Visit: Payer: Self-pay | Admitting: Adult Health

## 2016-05-15 NOTE — Telephone Encounter (Signed)
Ok to be refilled

## 2016-05-15 NOTE — Telephone Encounter (Signed)
Ok to refill 

## 2016-05-26 ENCOUNTER — Other Ambulatory Visit: Payer: Self-pay | Admitting: Adult Health

## 2016-06-18 ENCOUNTER — Other Ambulatory Visit: Payer: Self-pay | Admitting: Adult Health

## 2016-06-19 NOTE — Telephone Encounter (Signed)
Ok to refill 

## 2016-06-19 NOTE — Telephone Encounter (Signed)
Ok to refill for one month  

## 2016-06-19 NOTE — Telephone Encounter (Signed)
Rx called in as directed.   

## 2016-06-24 ENCOUNTER — Ambulatory Visit: Payer: Self-pay | Admitting: Adult Health

## 2016-06-30 ENCOUNTER — Ambulatory Visit: Payer: Self-pay | Admitting: Adult Health

## 2016-07-14 ENCOUNTER — Ambulatory Visit: Payer: Self-pay | Admitting: Adult Health

## 2016-07-20 ENCOUNTER — Other Ambulatory Visit: Payer: Self-pay | Admitting: Adult Health

## 2016-07-21 ENCOUNTER — Telehealth: Payer: Self-pay | Admitting: Adult Health

## 2016-07-21 ENCOUNTER — Ambulatory Visit: Payer: Self-pay | Admitting: Adult Health

## 2016-07-21 NOTE — Telephone Encounter (Signed)
Pt cancelled her appt again and would like nurse to return her call. Pt did not elaborate the reason

## 2016-07-21 NOTE — Telephone Encounter (Signed)
Ok to refill for one month  

## 2016-07-22 NOTE — Telephone Encounter (Signed)
Left message for patient to return phone call.  

## 2016-07-23 NOTE — Telephone Encounter (Signed)
Rx called in as directed.   

## 2016-07-24 NOTE — Telephone Encounter (Signed)
Patient wanted Tara Hopkins to know that moving to [place] did not work out so she has moved into a house here in [place] and will call us back and make appointment to see [employer]. Wanted to apologize for not keeping last appointment.

## 2016-07-26 NOTE — Telephone Encounter (Signed)
Noted  

## 2016-07-30 ENCOUNTER — Other Ambulatory Visit: Payer: Self-pay | Admitting: Adult Health

## 2016-08-14 ENCOUNTER — Other Ambulatory Visit: Payer: Self-pay | Admitting: Adult Health

## 2016-08-18 DIAGNOSIS — R531 Weakness: Secondary | ICD-10-CM | POA: Diagnosis not present

## 2016-08-18 DIAGNOSIS — R404 Transient alteration of awareness: Secondary | ICD-10-CM | POA: Diagnosis not present

## 2016-08-25 ENCOUNTER — Encounter: Payer: Self-pay | Admitting: Adult Health

## 2016-08-25 ENCOUNTER — Ambulatory Visit (INDEPENDENT_AMBULATORY_CARE_PROVIDER_SITE_OTHER): Payer: Medicare Other | Admitting: Adult Health

## 2016-08-25 ENCOUNTER — Other Ambulatory Visit: Payer: Self-pay | Admitting: Adult Health

## 2016-08-25 VITALS — BP 140/78 | Ht 65.0 in | Wt 185.8 lb

## 2016-08-25 DIAGNOSIS — R29898 Other symptoms and signs involving the musculoskeletal system: Secondary | ICD-10-CM

## 2016-08-25 DIAGNOSIS — E1142 Type 2 diabetes mellitus with diabetic polyneuropathy: Secondary | ICD-10-CM

## 2016-08-25 DIAGNOSIS — R2681 Unsteadiness on feet: Secondary | ICD-10-CM

## 2016-08-25 DIAGNOSIS — Z23 Encounter for immunization: Secondary | ICD-10-CM

## 2016-08-25 NOTE — Telephone Encounter (Signed)
Ok to refill 

## 2016-08-25 NOTE — Progress Notes (Signed)
Subjective:    Patient ID: Tara Hopkins, female    DOB: 02/09/1951, 65 y.o.   MRN: [id number]  HPI  66 year old female who  has a past medical history of Anemia; Anxiety; Bipolar 1 disorder ([place]); Cataracts, bilateral; Depression; Diabetes mellitus without complication ([place]); Glaucoma; History of blood transfusion; History of hyperbaric oxygen therapy; Hypercholesteremia; Hypertension; Peripheral neuropathy ([place]); and Stroke ([place]) (02/2015).  presents to the office today to discuss participating in physical therapy at home. She reports over the last month she has felt as though she has become weaker in her lower extremities. This is to the point where she has trouble walking without an assistance device or using a wheel chair. She states that " I feel really unsteady on my feet." Her daughter reports that she has fallen multiple times over the last month. All falls have not lead to an injury.   She feels more weak in her right lower extremity.   She denies any headaches, blurred vision, facial droop, numbness or tingling.   She has not been eating healthy and has not been exercising. She does not check her blood sugars at home. She does take her medication as prescribed.   She has recently moved in with her daughter so that she an help manage her care.    Review of Systems  Constitutional: Negative.   HENT: Negative.   Respiratory: Negative.   Cardiovascular: Negative.   Musculoskeletal: Positive for arthralgias and gait problem. Negative for joint swelling, myalgias, neck pain and neck stiffness.  Skin: Negative.   Psychiatric/Behavioral: Negative.   All other systems reviewed and are negative.  Past Medical History:  Diagnosis Date  . Anemia   . Anxiety   . Bipolar 1 disorder ([place])   . Cataracts, bilateral   . Depression   . Diabetes mellitus without complication ([place])   . Glaucoma   . History of blood transfusion   . History of hyperbaric oxygen therapy    pt reports 80  treatments.  . Hypercholesteremia   . Hypertension   . Peripheral neuropathy ([place])   . Stroke [facility]) 02/2015    Social History   Social History  . Marital status: Married    Spouse name: N/A  . Number of children: 3  . Years of education: 14   Occupational History  . Disbaled    Social History Main Topics  . Smoking status: Current Every Day Smoker    Packs/day: 0.01    Years: 20.00    Types: Cigarettes  . Smokeless tobacco: Never Used  . Alcohol use 0.0 oz/week     Comment: beer every 3 months  . Drug use: No  . Sexual activity: Not on file   Other Topics Concern  . Not on file   Social History Narrative   Lives at home alone.   Right-handed.   No caffeine use.   Oldest of 12 children        Past Surgical History:  Procedure Laterality Date  . AMPUTATION Right 12/15/2013   Procedure: Right Transmetatarsal Amputation;  Surgeon: Newt Minion, MD;  Location: [facility];  Service: Orthopedics;  Laterality: Right;  . AMPUTATION Right 02/23/2014   Procedure: AMPUTATION FOOT;  Surgeon: Newt Minion, MD;  Location: [facility];  Service: Orthopedics;  Laterality: Right;  Right Foot Revision Transmetatarsal Amputation  . I&D EXTREMITY Right 05/23/2013   Procedure: IRRIGATION AND DEBRIDEMENT RIGHT GREAT TOE;  Surgeon: Mauri Pole, MD;  Location: [facility];  Service: Orthopedics;  Laterality: Right;    Family History  Problem Relation Age of Onset  . Colon cancer Neg Hx   . Liver cancer Mother   . Lung cancer Mother   . Hypertension Father   . Depression Father   . Depression Mother     No Known Allergies  Current Outpatient Prescriptions on File Prior to Visit  Medication Sig Dispense Refill  . aspirin EC 325 MG EC tablet Take 1 tablet (325 mg total) by mouth daily. 30 tablet 0  . buPROPion (WELLBUTRIN SR) 150 MG 12 hr tablet take 1 tablet by mouth twice a day 90 tablet 1  . clonazePAM (KLONOPIN) 0.5 MG tablet take 1 tablet by mouth three times a day if needed 90 tablet 0    . COD LIVER OIL PO Take 1 capsule by mouth daily.    Marland Kitchen gabapentin (NEURONTIN) 400 MG capsule take 2 capsules by mouth twice a day 120 capsule 5  . gentamicin (GARAMYCIN) 0.3 % ophthalmic solution   0  . glimepiride (AMARYL) 4 MG tablet take 1 tablet by mouth once daily 30 tablet 1  . glucose blood test strip Test blood sugars 3 times daily Dx: E11.42 100 each 12  . ILEVRO 0.3 % ophthalmic suspension   0  . insulin aspart (NOVOLOG FLEXPEN) 100 UNIT/ML FlexPen INJECT 2-6 UNITS INTO SKIN THREE TIMES DAILY 15 pen 3  . Insulin Pen Needle (B-D UF III MINI PEN NEEDLES) 31G X 5 MM MISC Test blood sugars 3 times daily Dx: E11.42 100 each 5  . lamoTRIgine (LAMICTAL) 100 MG tablet take 1/2 tablet by mouth twice a day 30 tablet 5  . LANTUS 100 UNIT/ML injection INJECT 30 UNITS UNDER THE SKIN ONCE DAILY AT BEDTIME 10 vial 5  . latanoprost (XALATAN) 0.005 % ophthalmic solution place 1 drop into both eyes at bedtime 2.5 mL 12  . LATUDA 20 MG TABS tablet take 1 tablet by mouth once daily 30 tablet 3  . lisinopril (PRINIVIL,ZESTRIL) 20 MG tablet Take 1 tablet (20 mg total) by mouth daily. 90 tablet 3  . ondansetron (ZOFRAN) 4 MG tablet Take 1 tablet (4 mg total) by mouth every 8 (eight) hours as needed for nausea or vomiting. 30 tablet 1  . ONE TOUCH LANCETS MISC USE TO CHECK BLOOD SUGAR 3 TIMES DAILY 200 each 3  . prednisoLONE acetate (PRED FORTE) 1 % ophthalmic suspension   0  . simvastatin (ZOCOR) 40 MG tablet take 1 tablet by mouth at bedtime 30 tablet 6  . white petrolatum (VASELINE) GEL Apply 1 application topically at bedtime. Apply to both feet    . zinc gluconate 50 MG tablet Take 50 mg by mouth 2 (two) times daily.     No current facility-administered medications on file prior to visit.     BP 140/78   Ht '5\' 5"'$  (1.651 m)   Wt 185 lb 12.8 oz (84.3 kg)   BMI 30.92 kg/m       Objective:   Physical Exam  Constitutional: She is oriented to person, place, and time. She appears well-developed  and well-nourished. No distress.  HENT:  Head: Normocephalic and atraumatic.  Right Ear: External ear normal.  Left Ear: External ear normal.  Nose: Nose normal.  Mouth/Throat: Oropharynx is clear and moist. No oropharyngeal exudate.  Eyes: Conjunctivae and EOM are normal. Pupils are equal, round, and reactive to light. Right eye exhibits no discharge. Left eye exhibits no discharge. No scleral icterus.  Neck: Normal range of motion. Neck supple. No JVD present. No tracheal deviation present. No thyromegaly present.  Cardiovascular: Normal rate, regular rhythm, normal heart sounds and intact distal pulses.  Exam reveals no gallop and no friction rub.   No murmur heard. Pulmonary/Chest: Effort normal and breath sounds normal. No stridor. No respiratory distress. She has no wheezes. She has no rales. She exhibits no tenderness.  Musculoskeletal: Normal range of motion. She exhibits edema (+1 bilateral lower extremity edema). She exhibits no tenderness or deformity.  Partial amputation of right foot. She is in a wheelchair during the office visit. She is able to stand under her own power but appears off balance in doing so.   Lymphadenopathy:    She has no cervical adenopathy.  Neurological: She is alert and oriented to person, place, and time.  Skin: Skin is warm and dry. No rash noted. She is not diaphoretic. No erythema. No pallor.  Psychiatric: She has a normal mood and affect. Her behavior is normal. Thought content normal.  Nursing note and vitals reviewed.     Assessment & Plan:  1. Weakness of both lower extremities - likely due to deconditioning  - Ambulatory referral to [facility] using walker while at home   2. Gait instability  - Ambulatory referral to Home Health  3. DM type 2 with diabetic peripheral neuropathy (HCC)  - POC HgB A1c- 10.9.  - Poorly controlled - Educated her and her daughter on preparing a diabetic diet and routine exercise - I am going to have  her go up to 35  Units of Lantus in the evening  - Consider increasing insulin aspart - Follow up in three months or sooner if needed  4. Need for prophylactic vaccination and inoculation against influenza  - Flu Vaccine QUAD 36+ mos IM (Fluarix & Fluzone Quad PF   [name], NP

## 2016-08-25 NOTE — Patient Instructions (Signed)
It was great seeing you today!  I will put the order if for physical therapy at home.   Your A1c is 9.7 - I want you to go up to 35 units of insulin at night.   You need to work on your diet to get that A1c down.   Please follow up with me in 3 months or sooner if needed

## 2016-08-26 NOTE — Telephone Encounter (Signed)
Ok to refill for 3 months.  

## 2016-08-26 NOTE — Telephone Encounter (Signed)
Rx called in as directed.   

## 2016-08-26 NOTE — Telephone Encounter (Signed)
Ok to refill for ONE month

## 2016-08-27 LAB — POCT GLYCOSYLATED HEMOGLOBIN (HGB A1C): Hemoglobin A1C: 10.9

## 2016-08-28 ENCOUNTER — Telehealth: Payer: Self-pay | Admitting: Adult Health

## 2016-08-28 DIAGNOSIS — M6281 Muscle weakness (generalized): Secondary | ICD-10-CM | POA: Diagnosis not present

## 2016-08-28 DIAGNOSIS — D649 Anemia, unspecified: Secondary | ICD-10-CM | POA: Diagnosis not present

## 2016-08-28 DIAGNOSIS — Z8673 Personal history of transient ischemic attack (TIA), and cerebral infarction without residual deficits: Secondary | ICD-10-CM | POA: Diagnosis not present

## 2016-08-28 DIAGNOSIS — E785 Hyperlipidemia, unspecified: Secondary | ICD-10-CM | POA: Diagnosis not present

## 2016-08-28 DIAGNOSIS — Z89411 Acquired absence of right great toe: Secondary | ICD-10-CM | POA: Diagnosis not present

## 2016-08-28 DIAGNOSIS — Z89421 Acquired absence of other right toe(s): Secondary | ICD-10-CM | POA: Diagnosis not present

## 2016-08-28 DIAGNOSIS — Z7982 Long term (current) use of aspirin: Secondary | ICD-10-CM | POA: Diagnosis not present

## 2016-08-28 DIAGNOSIS — F329 Major depressive disorder, single episode, unspecified: Secondary | ICD-10-CM | POA: Diagnosis not present

## 2016-08-28 DIAGNOSIS — R2681 Unsteadiness on feet: Secondary | ICD-10-CM | POA: Diagnosis not present

## 2016-08-28 DIAGNOSIS — Z794 Long term (current) use of insulin: Secondary | ICD-10-CM | POA: Diagnosis not present

## 2016-08-28 DIAGNOSIS — F319 Bipolar disorder, unspecified: Secondary | ICD-10-CM | POA: Diagnosis not present

## 2016-08-28 DIAGNOSIS — E1142 Type 2 diabetes mellitus with diabetic polyneuropathy: Secondary | ICD-10-CM | POA: Diagnosis not present

## 2016-08-28 DIAGNOSIS — F419 Anxiety disorder, unspecified: Secondary | ICD-10-CM | POA: Diagnosis not present

## 2016-08-28 DIAGNOSIS — H409 Unspecified glaucoma: Secondary | ICD-10-CM | POA: Diagnosis not present

## 2016-08-28 DIAGNOSIS — I10 Essential (primary) hypertension: Secondary | ICD-10-CM | POA: Diagnosis not present

## 2016-08-28 DIAGNOSIS — Z720 Tobacco use: Secondary | ICD-10-CM | POA: Diagnosis not present

## 2016-08-28 NOTE — Telephone Encounter (Signed)
Is this ok?

## 2016-08-28 NOTE — Telephone Encounter (Signed)
[facility] would like order for pt to have OT for ADLS

## 2016-08-28 NOTE — Telephone Encounter (Signed)
That is fine 

## 2016-08-28 NOTE — Telephone Encounter (Signed)
Patient notified

## 2016-08-28 NOTE — Telephone Encounter (Signed)
Pt states PT is there now and would liek a new order for an Orthotic shoe. The swelling haas gone down on amputated foot and the old one does not fit.  Barnett Applebaum the PT therapist states she will pick up the order.

## 2016-08-28 NOTE — Telephone Encounter (Signed)
Deana notified.

## 2016-09-02 DIAGNOSIS — F329 Major depressive disorder, single episode, unspecified: Secondary | ICD-10-CM | POA: Diagnosis not present

## 2016-09-02 DIAGNOSIS — I10 Essential (primary) hypertension: Secondary | ICD-10-CM | POA: Diagnosis not present

## 2016-09-02 DIAGNOSIS — M6281 Muscle weakness (generalized): Secondary | ICD-10-CM | POA: Diagnosis not present

## 2016-09-02 DIAGNOSIS — R2681 Unsteadiness on feet: Secondary | ICD-10-CM | POA: Diagnosis not present

## 2016-09-02 DIAGNOSIS — F419 Anxiety disorder, unspecified: Secondary | ICD-10-CM | POA: Diagnosis not present

## 2016-09-02 DIAGNOSIS — E1142 Type 2 diabetes mellitus with diabetic polyneuropathy: Secondary | ICD-10-CM | POA: Diagnosis not present

## 2016-09-04 ENCOUNTER — Telehealth: Payer: Self-pay | Admitting: Adult Health

## 2016-09-04 DIAGNOSIS — F419 Anxiety disorder, unspecified: Secondary | ICD-10-CM | POA: Diagnosis not present

## 2016-09-04 DIAGNOSIS — F329 Major depressive disorder, single episode, unspecified: Secondary | ICD-10-CM | POA: Diagnosis not present

## 2016-09-04 DIAGNOSIS — I10 Essential (primary) hypertension: Secondary | ICD-10-CM | POA: Diagnosis not present

## 2016-09-04 DIAGNOSIS — E1142 Type 2 diabetes mellitus with diabetic polyneuropathy: Secondary | ICD-10-CM | POA: Diagnosis not present

## 2016-09-04 DIAGNOSIS — R2681 Unsteadiness on feet: Secondary | ICD-10-CM | POA: Diagnosis not present

## 2016-09-04 DIAGNOSIS — M6281 Muscle weakness (generalized): Secondary | ICD-10-CM | POA: Diagnosis not present

## 2016-09-04 NOTE — Telephone Encounter (Signed)
[facility] would like verbal orders for OT, 2 wk / 4  For ADL training, transfer training, and offer HEP.

## 2016-09-07 NOTE — Telephone Encounter (Signed)
[facility] for verbal ordered

## 2016-09-08 NOTE — Telephone Encounter (Signed)
Radovan notified and verbalized understanding.

## 2016-09-10 ENCOUNTER — Telehealth: Payer: Self-pay | Admitting: Adult Health

## 2016-09-10 DIAGNOSIS — F419 Anxiety disorder, unspecified: Secondary | ICD-10-CM | POA: Diagnosis not present

## 2016-09-10 DIAGNOSIS — M6281 Muscle weakness (generalized): Secondary | ICD-10-CM | POA: Diagnosis not present

## 2016-09-10 DIAGNOSIS — I10 Essential (primary) hypertension: Secondary | ICD-10-CM | POA: Diagnosis not present

## 2016-09-10 DIAGNOSIS — E1142 Type 2 diabetes mellitus with diabetic polyneuropathy: Secondary | ICD-10-CM | POA: Diagnosis not present

## 2016-09-10 DIAGNOSIS — R2681 Unsteadiness on feet: Secondary | ICD-10-CM | POA: Diagnosis not present

## 2016-09-10 DIAGNOSIS — F329 Major depressive disorder, single episode, unspecified: Secondary | ICD-10-CM | POA: Diagnosis not present

## 2016-09-10 NOTE — Telephone Encounter (Signed)
Will route to [facility] as [employer]

## 2016-09-10 NOTE — Telephone Encounter (Signed)
Pt has had two falls Monday and Tuesday trying to reach for a phone cord and when returning to her seat she has no bruises or fracture did not go and get checked out.

## 2016-09-10 NOTE — Telephone Encounter (Signed)
Please call and make sure she is alright

## 2016-09-11 DIAGNOSIS — F419 Anxiety disorder, unspecified: Secondary | ICD-10-CM | POA: Diagnosis not present

## 2016-09-11 DIAGNOSIS — E1142 Type 2 diabetes mellitus with diabetic polyneuropathy: Secondary | ICD-10-CM | POA: Diagnosis not present

## 2016-09-11 DIAGNOSIS — F329 Major depressive disorder, single episode, unspecified: Secondary | ICD-10-CM | POA: Diagnosis not present

## 2016-09-11 DIAGNOSIS — R2681 Unsteadiness on feet: Secondary | ICD-10-CM | POA: Diagnosis not present

## 2016-09-11 DIAGNOSIS — I10 Essential (primary) hypertension: Secondary | ICD-10-CM | POA: Diagnosis not present

## 2016-09-11 DIAGNOSIS — M6281 Muscle weakness (generalized): Secondary | ICD-10-CM | POA: Diagnosis not present

## 2016-09-11 NOTE — Telephone Encounter (Signed)
Pt had a missed visit today per radovan

## 2016-09-11 NOTE — Telephone Encounter (Signed)
Left message for patient to return phone call.  

## 2016-09-14 DIAGNOSIS — M6281 Muscle weakness (generalized): Secondary | ICD-10-CM | POA: Diagnosis not present

## 2016-09-14 DIAGNOSIS — E1142 Type 2 diabetes mellitus with diabetic polyneuropathy: Secondary | ICD-10-CM | POA: Diagnosis not present

## 2016-09-14 DIAGNOSIS — I10 Essential (primary) hypertension: Secondary | ICD-10-CM | POA: Diagnosis not present

## 2016-09-14 DIAGNOSIS — F419 Anxiety disorder, unspecified: Secondary | ICD-10-CM | POA: Diagnosis not present

## 2016-09-14 DIAGNOSIS — F329 Major depressive disorder, single episode, unspecified: Secondary | ICD-10-CM | POA: Diagnosis not present

## 2016-09-14 DIAGNOSIS — R2681 Unsteadiness on feet: Secondary | ICD-10-CM | POA: Diagnosis not present

## 2016-09-15 DIAGNOSIS — F329 Major depressive disorder, single episode, unspecified: Secondary | ICD-10-CM | POA: Diagnosis not present

## 2016-09-15 DIAGNOSIS — F419 Anxiety disorder, unspecified: Secondary | ICD-10-CM | POA: Diagnosis not present

## 2016-09-15 DIAGNOSIS — M6281 Muscle weakness (generalized): Secondary | ICD-10-CM | POA: Diagnosis not present

## 2016-09-15 DIAGNOSIS — I10 Essential (primary) hypertension: Secondary | ICD-10-CM | POA: Diagnosis not present

## 2016-09-15 DIAGNOSIS — E1142 Type 2 diabetes mellitus with diabetic polyneuropathy: Secondary | ICD-10-CM | POA: Diagnosis not present

## 2016-09-15 DIAGNOSIS — R2681 Unsteadiness on feet: Secondary | ICD-10-CM | POA: Diagnosis not present

## 2016-09-16 DIAGNOSIS — F329 Major depressive disorder, single episode, unspecified: Secondary | ICD-10-CM | POA: Diagnosis not present

## 2016-09-16 DIAGNOSIS — R2681 Unsteadiness on feet: Secondary | ICD-10-CM | POA: Diagnosis not present

## 2016-09-16 DIAGNOSIS — I10 Essential (primary) hypertension: Secondary | ICD-10-CM | POA: Diagnosis not present

## 2016-09-16 DIAGNOSIS — E1142 Type 2 diabetes mellitus with diabetic polyneuropathy: Secondary | ICD-10-CM | POA: Diagnosis not present

## 2016-09-16 DIAGNOSIS — F419 Anxiety disorder, unspecified: Secondary | ICD-10-CM | POA: Diagnosis not present

## 2016-09-16 DIAGNOSIS — M6281 Muscle weakness (generalized): Secondary | ICD-10-CM | POA: Diagnosis not present

## 2016-09-16 NOTE — Telephone Encounter (Signed)
Tara Hopkins pt returned your call

## 2016-09-16 NOTE — Telephone Encounter (Signed)
Left message for patient to return phone call.  

## 2016-09-16 NOTE — Telephone Encounter (Signed)
I got in contact with the patient. She states that hse is doing good. She states that she feel on her bottom when getting out of bed. She states that she is all better now and does not need to be seen.  I asked patient about missed appt with Radovan, and she states that there was a miscommunication and that they got it worked out. Thanks!

## 2016-09-17 ENCOUNTER — Other Ambulatory Visit: Payer: Self-pay | Admitting: Adult Health

## 2016-09-17 ENCOUNTER — Telehealth: Payer: Self-pay | Admitting: Adult Health

## 2016-09-17 DIAGNOSIS — E1142 Type 2 diabetes mellitus with diabetic polyneuropathy: Secondary | ICD-10-CM

## 2016-09-17 NOTE — Telephone Encounter (Signed)
Pt would like a referral to podiatrist to get her toenails cut. Pt is dm

## 2016-09-21 DIAGNOSIS — F419 Anxiety disorder, unspecified: Secondary | ICD-10-CM | POA: Diagnosis not present

## 2016-09-21 DIAGNOSIS — R2681 Unsteadiness on feet: Secondary | ICD-10-CM | POA: Diagnosis not present

## 2016-09-21 DIAGNOSIS — E1142 Type 2 diabetes mellitus with diabetic polyneuropathy: Secondary | ICD-10-CM | POA: Diagnosis not present

## 2016-09-21 DIAGNOSIS — I10 Essential (primary) hypertension: Secondary | ICD-10-CM | POA: Diagnosis not present

## 2016-09-21 DIAGNOSIS — M6281 Muscle weakness (generalized): Secondary | ICD-10-CM | POA: Diagnosis not present

## 2016-09-21 DIAGNOSIS — F329 Major depressive disorder, single episode, unspecified: Secondary | ICD-10-CM | POA: Diagnosis not present

## 2016-09-22 DIAGNOSIS — E1142 Type 2 diabetes mellitus with diabetic polyneuropathy: Secondary | ICD-10-CM | POA: Diagnosis not present

## 2016-09-22 DIAGNOSIS — I10 Essential (primary) hypertension: Secondary | ICD-10-CM | POA: Diagnosis not present

## 2016-09-22 DIAGNOSIS — F329 Major depressive disorder, single episode, unspecified: Secondary | ICD-10-CM | POA: Diagnosis not present

## 2016-09-22 DIAGNOSIS — F419 Anxiety disorder, unspecified: Secondary | ICD-10-CM | POA: Diagnosis not present

## 2016-09-22 DIAGNOSIS — M6281 Muscle weakness (generalized): Secondary | ICD-10-CM | POA: Diagnosis not present

## 2016-09-22 DIAGNOSIS — R2681 Unsteadiness on feet: Secondary | ICD-10-CM | POA: Diagnosis not present

## 2016-09-23 DIAGNOSIS — M6281 Muscle weakness (generalized): Secondary | ICD-10-CM | POA: Diagnosis not present

## 2016-09-23 DIAGNOSIS — I10 Essential (primary) hypertension: Secondary | ICD-10-CM | POA: Diagnosis not present

## 2016-09-23 DIAGNOSIS — R2681 Unsteadiness on feet: Secondary | ICD-10-CM | POA: Diagnosis not present

## 2016-09-23 DIAGNOSIS — F329 Major depressive disorder, single episode, unspecified: Secondary | ICD-10-CM | POA: Diagnosis not present

## 2016-09-23 DIAGNOSIS — E1142 Type 2 diabetes mellitus with diabetic polyneuropathy: Secondary | ICD-10-CM | POA: Diagnosis not present

## 2016-09-23 DIAGNOSIS — F419 Anxiety disorder, unspecified: Secondary | ICD-10-CM | POA: Diagnosis not present

## 2016-09-25 ENCOUNTER — Other Ambulatory Visit: Payer: Self-pay | Admitting: Adult Health

## 2016-09-25 DIAGNOSIS — I10 Essential (primary) hypertension: Secondary | ICD-10-CM | POA: Diagnosis not present

## 2016-09-25 DIAGNOSIS — F419 Anxiety disorder, unspecified: Secondary | ICD-10-CM | POA: Diagnosis not present

## 2016-09-25 DIAGNOSIS — E1142 Type 2 diabetes mellitus with diabetic polyneuropathy: Secondary | ICD-10-CM | POA: Diagnosis not present

## 2016-09-25 DIAGNOSIS — R2681 Unsteadiness on feet: Secondary | ICD-10-CM | POA: Diagnosis not present

## 2016-09-25 DIAGNOSIS — M6281 Muscle weakness (generalized): Secondary | ICD-10-CM | POA: Diagnosis not present

## 2016-09-25 DIAGNOSIS — F329 Major depressive disorder, single episode, unspecified: Secondary | ICD-10-CM | POA: Diagnosis not present

## 2016-09-28 ENCOUNTER — Telehealth: Payer: Self-pay | Admitting: *Deleted

## 2016-09-28 ENCOUNTER — Other Ambulatory Visit: Payer: Self-pay | Admitting: Adult Health

## 2016-09-28 DIAGNOSIS — I10 Essential (primary) hypertension: Secondary | ICD-10-CM

## 2016-09-28 NOTE — Telephone Encounter (Signed)
Tried to contact pt, left message on voicemail to call office.  PLEASE NOTE: All timestamps contained within this report are represented as [place] Standard Time. CONFIDENTIALTY NOTICE: This fax transmission is intended only for the addressee. It contains information that is legally privileged, confidential or otherwise protected from use or disclosure. If you are not the intended recipient, you are strictly prohibited from reviewing, disclosing, copying using or disseminating any of this information or taking any action in reliance on or regarding this information. If you have received this fax in error, please notify us immediately by telephone so that we can arrange for its return to [place]. Phone: [phone], Toll-Free: [phone], Fax: [phone] Page: 1 of 1 Call Id: [id number] [facility] Night - Client Nonclinical Telephone Record [facility] Night - Client Client Site [facility] - Night Physician Carolann Littler - MD Contact Type Call Who Is Calling Physician / Provider / Hospital Call Type Provider Call [facility] Page Now Reason for Call Request to speak to Physician Initial Comment Caller states Jalene Mullet from [facility]. Calling in regards to a pt's BP is 180/90. Needing to notify the doctor. Additional Comment The number Jalene Mullet gave was the pt's number. Did not give her own callback number. Patient Name Tara Hopkins Patient DOB 1951/04/19 Requesting Provider [facility] Physician Number [phone] Facility Name [facility] Paging DoctorName Phone DateTime Result/Outcome Message Type Notes Dimple Nanas - MD [phone] 09/25/2016 5:50:49 PM Called On Call Provider - Reached Doctor Paged Please call Alyssa from the answering service at [phone] Dimple Nanas - MD 09/25/2016 5:52:48 PM Spoke with On Call - General Message Result Spoke with the on call and  attempted to connect with the pt. Called three times with no answer. Provided pt information and callback number to the on call. Call Closed By: Pearline Cables Transaction Date/Time: 09/25/2016 5:35:48 PM (ET)

## 2016-09-28 NOTE — Telephone Encounter (Signed)
Pt called back, asked her how her blood pressure was over the weekend? Pt said she did not take it. She said her caregiver is coming this afternoon and she will check it then. Told her okay please call me back and let me know what it is. If it is still elevated need to see [facility] tomorrow. Pt verbalized understanding. Asked pt if she is having any headaches, dizziness or blurred vision? Pt said no. Told her okay wait to hear from you later.

## 2016-09-28 NOTE — Telephone Encounter (Signed)
Tara Hopkins, can you try this pt later on again. She called about her blood pressure but can not get in touch with her.

## 2016-09-29 ENCOUNTER — Other Ambulatory Visit: Payer: Self-pay | Admitting: Adult Health

## 2016-09-29 DIAGNOSIS — F329 Major depressive disorder, single episode, unspecified: Secondary | ICD-10-CM | POA: Diagnosis not present

## 2016-09-29 DIAGNOSIS — E1142 Type 2 diabetes mellitus with diabetic polyneuropathy: Secondary | ICD-10-CM | POA: Diagnosis not present

## 2016-09-29 DIAGNOSIS — R2681 Unsteadiness on feet: Secondary | ICD-10-CM | POA: Diagnosis not present

## 2016-09-29 DIAGNOSIS — M6281 Muscle weakness (generalized): Secondary | ICD-10-CM | POA: Diagnosis not present

## 2016-09-29 DIAGNOSIS — F419 Anxiety disorder, unspecified: Secondary | ICD-10-CM | POA: Diagnosis not present

## 2016-09-29 DIAGNOSIS — I10 Essential (primary) hypertension: Secondary | ICD-10-CM | POA: Diagnosis not present

## 2016-09-29 NOTE — Telephone Encounter (Signed)
Ok to refill 

## 2016-09-29 NOTE — Telephone Encounter (Signed)
I contacted patient. She states that she is feeling much better, and her BP has been good. She states is has been running around 130/80. She said her [place] is coming later today, and will call if she gets a different reading then. She states that overall though, she is feeling much better and denies any dizziness, chest pain, blurry vision, & headache.

## 2016-09-30 ENCOUNTER — Telehealth: Payer: Self-pay | Admitting: Adult Health

## 2016-09-30 NOTE — Telephone Encounter (Signed)
OK 

## 2016-09-30 NOTE — Telephone Encounter (Signed)
Okay for verbal 

## 2016-09-30 NOTE — Telephone Encounter (Signed)
Pt can not start OT until next week and OT would like to have verbal orders to do so

## 2016-10-05 NOTE — Telephone Encounter (Signed)
They are aware of verbal orders.

## 2016-10-06 ENCOUNTER — Telehealth: Payer: Self-pay | Admitting: Adult Health

## 2016-10-06 ENCOUNTER — Other Ambulatory Visit: Payer: Self-pay | Admitting: Adult Health

## 2016-10-06 DIAGNOSIS — R2681 Unsteadiness on feet: Secondary | ICD-10-CM | POA: Diagnosis not present

## 2016-10-06 DIAGNOSIS — F419 Anxiety disorder, unspecified: Secondary | ICD-10-CM | POA: Diagnosis not present

## 2016-10-06 DIAGNOSIS — M6281 Muscle weakness (generalized): Secondary | ICD-10-CM | POA: Diagnosis not present

## 2016-10-06 DIAGNOSIS — E1142 Type 2 diabetes mellitus with diabetic polyneuropathy: Secondary | ICD-10-CM | POA: Diagnosis not present

## 2016-10-06 DIAGNOSIS — I10 Essential (primary) hypertension: Secondary | ICD-10-CM | POA: Diagnosis not present

## 2016-10-06 DIAGNOSIS — F329 Major depressive disorder, single episode, unspecified: Secondary | ICD-10-CM | POA: Diagnosis not present

## 2016-10-06 NOTE — Telephone Encounter (Signed)
Ok to refill for one year  

## 2016-10-06 NOTE — Telephone Encounter (Signed)
Pt request refill  lisinopril (PRINIVIL,ZESTRIL) 20 MG tablet  Pt states she has only been doing a 30 day supply each month and [facility] told her she has no refills left. It was written 90 day w/ 3 refills in Jan, so she should have refills Pt would like to know if you can call and straighten this out.

## 2016-10-06 NOTE — Telephone Encounter (Signed)
Ok to refill 

## 2016-10-07 DIAGNOSIS — E1142 Type 2 diabetes mellitus with diabetic polyneuropathy: Secondary | ICD-10-CM | POA: Diagnosis not present

## 2016-10-07 DIAGNOSIS — M6281 Muscle weakness (generalized): Secondary | ICD-10-CM | POA: Diagnosis not present

## 2016-10-07 DIAGNOSIS — R2681 Unsteadiness on feet: Secondary | ICD-10-CM | POA: Diagnosis not present

## 2016-10-07 DIAGNOSIS — F419 Anxiety disorder, unspecified: Secondary | ICD-10-CM | POA: Diagnosis not present

## 2016-10-07 DIAGNOSIS — F329 Major depressive disorder, single episode, unspecified: Secondary | ICD-10-CM | POA: Diagnosis not present

## 2016-10-07 DIAGNOSIS — I10 Essential (primary) hypertension: Secondary | ICD-10-CM | POA: Diagnosis not present

## 2016-10-07 NOTE — Telephone Encounter (Signed)
I contacted [facility]. Rx was originally filled and [facility] on [address], but patient no longer wanted her medications from there, and would rather have them filled at [facility].  Rx given to pharmacist at [place] verbally over the phone.

## 2016-10-09 ENCOUNTER — Ambulatory Visit: Payer: Medicare Other | Admitting: Podiatry

## 2016-10-09 ENCOUNTER — Telehealth: Payer: Self-pay | Admitting: Adult Health

## 2016-10-09 DIAGNOSIS — I10 Essential (primary) hypertension: Secondary | ICD-10-CM | POA: Diagnosis not present

## 2016-10-09 DIAGNOSIS — R2681 Unsteadiness on feet: Secondary | ICD-10-CM | POA: Diagnosis not present

## 2016-10-09 DIAGNOSIS — F329 Major depressive disorder, single episode, unspecified: Secondary | ICD-10-CM | POA: Diagnosis not present

## 2016-10-09 DIAGNOSIS — F419 Anxiety disorder, unspecified: Secondary | ICD-10-CM | POA: Diagnosis not present

## 2016-10-09 DIAGNOSIS — E1142 Type 2 diabetes mellitus with diabetic polyneuropathy: Secondary | ICD-10-CM | POA: Diagnosis not present

## 2016-10-09 DIAGNOSIS — M6281 Muscle weakness (generalized): Secondary | ICD-10-CM | POA: Diagnosis not present

## 2016-10-09 NOTE — Telephone Encounter (Signed)
[place] with [facility] states pt is being dc'd today from OT. Pt has met all her goals. Pt will continue PT, while waiting on her prosthetic.

## 2016-10-09 NOTE — Telephone Encounter (Signed)
Will route to [facility] as FYI.

## 2016-10-19 ENCOUNTER — Telehealth: Payer: Self-pay | Admitting: Adult Health

## 2016-10-19 NOTE — Telephone Encounter (Signed)
Tara Hopkins is calling and following-up on elbow brace, a response has been received for the knee brace but not the elbow brace.  [phone] and reference ID# [id number]. [facility]

## 2016-10-20 NOTE — Telephone Encounter (Signed)
Please advise 

## 2016-10-22 NOTE — Telephone Encounter (Signed)
Attempted to call but was placed on hold. I had to hang up as I had patient care to attend to. Will try calling back another time

## 2016-10-24 NOTE — Telephone Encounter (Signed)
Spoke with pt and pt states she has the knee brace and the elbow brace.

## 2016-10-26 NOTE — Telephone Encounter (Signed)
Noted  

## 2016-11-04 ENCOUNTER — Other Ambulatory Visit: Payer: Self-pay | Admitting: Adult Health

## 2016-11-04 NOTE — Telephone Encounter (Signed)
Ok to refill 

## 2016-11-04 NOTE — Telephone Encounter (Signed)
Ok to refill for one month  

## 2016-11-18 ENCOUNTER — Ambulatory Visit: Payer: Medicare Other | Admitting: Podiatry

## 2016-11-24 DIAGNOSIS — H40003 Preglaucoma, unspecified, bilateral: Secondary | ICD-10-CM | POA: Diagnosis not present

## 2016-11-24 DIAGNOSIS — E113413 Type 2 diabetes mellitus with severe nonproliferative diabetic retinopathy with macular edema, bilateral: Secondary | ICD-10-CM | POA: Diagnosis not present

## 2016-11-26 ENCOUNTER — Ambulatory Visit: Payer: Self-pay | Admitting: Adult Health

## 2016-11-29 ENCOUNTER — Other Ambulatory Visit: Payer: Self-pay | Admitting: Adult Health

## 2016-11-30 NOTE — Telephone Encounter (Signed)
Ok to refill Amaryl for one year  Lamictal for 6 months

## 2016-12-03 ENCOUNTER — Ambulatory Visit: Payer: Self-pay | Admitting: Adult Health

## 2016-12-10 ENCOUNTER — Other Ambulatory Visit: Payer: Self-pay | Admitting: Adult Health

## 2016-12-10 NOTE — Telephone Encounter (Signed)
Ok to refill 

## 2016-12-10 NOTE — Telephone Encounter (Signed)
Ok to refill for one month  

## 2016-12-11 NOTE — Telephone Encounter (Signed)
Rx has been called in as directed.  

## 2016-12-15 ENCOUNTER — Ambulatory Visit: Payer: Self-pay | Admitting: Adult Health

## 2016-12-30 ENCOUNTER — Ambulatory Visit (INDEPENDENT_AMBULATORY_CARE_PROVIDER_SITE_OTHER): Payer: Medicare Other | Admitting: Podiatry

## 2016-12-30 ENCOUNTER — Encounter: Payer: Self-pay | Admitting: Podiatry

## 2016-12-30 DIAGNOSIS — B351 Tinea unguium: Secondary | ICD-10-CM | POA: Diagnosis not present

## 2016-12-30 DIAGNOSIS — E1151 Type 2 diabetes mellitus with diabetic peripheral angiopathy without gangrene: Secondary | ICD-10-CM | POA: Diagnosis not present

## 2016-12-30 NOTE — Progress Notes (Signed)
   Subjective:    Patient ID: Tara Hopkins, female    DOB: [birth year], 66 y.o.   MRN: [id number]  HPI     This patient presents today complaining that his toenails on the left foot are long and thick and have become progressively more deformed over the past year. Patient has attempted to trim the toenails, however, not able to trim toenails himself  Patient is diabetic and denies history of claudication History of transmetatarsal amputation right Patient is a current smoker   Review of Systems  All other systems reviewed and are negative.      Objective:   Physical Exam  Orientated 3  Vascular: DP pulses 0/4 bilaterally PT pulses 0/4 bilaterally Capillary reflex within normal limits bilaterally  Neurological: Sensation to 10 g monofilament wire intact 5/5 bilaterally Vibratory sensation reactive bilaterally Ankle reflexes reactive bilaterally  Dermatological: No open skin lesions bilaterally Atrophic skin bilaterally Toenails 1-5 left are elongated, discolored, hypertrophic and tender to direct palpation  Musculoskeletal: Transmetatarsal amputation right with well-healed surgical incision Manual motor testing dorsi flexion, plantar flexion 5/5 bilaterally      Assessment & Plan:   Assessment: Diabetic with peripheral arterial disease Protective sensation intact 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

## 2016-12-30 NOTE — Patient Instructions (Signed)

## 2016-12-31 ENCOUNTER — Ambulatory Visit: Payer: Self-pay | Admitting: Adult Health

## 2017-01-01 ENCOUNTER — Other Ambulatory Visit: Payer: Self-pay | Admitting: Adult Health

## 2017-01-05 ENCOUNTER — Ambulatory Visit: Payer: Self-pay | Admitting: Adult Health

## 2017-01-08 ENCOUNTER — Ambulatory Visit: Payer: Self-pay | Admitting: Adult Health

## 2017-01-12 ENCOUNTER — Ambulatory Visit: Payer: Self-pay | Admitting: Adult Health

## 2017-02-09 ENCOUNTER — Ambulatory Visit (INDEPENDENT_AMBULATORY_CARE_PROVIDER_SITE_OTHER): Payer: Medicare Other | Admitting: Adult Health

## 2017-02-09 ENCOUNTER — Encounter: Payer: Self-pay | Admitting: Adult Health

## 2017-02-09 VITALS — BP 128/64 | Temp 97.8°F | Ht 65.0 in | Wt 181.6 lb

## 2017-02-09 DIAGNOSIS — G629 Polyneuropathy, unspecified: Secondary | ICD-10-CM

## 2017-02-09 DIAGNOSIS — E1142 Type 2 diabetes mellitus with diabetic polyneuropathy: Secondary | ICD-10-CM

## 2017-02-09 DIAGNOSIS — I10 Essential (primary) hypertension: Secondary | ICD-10-CM | POA: Diagnosis not present

## 2017-02-09 MED ORDER — GABAPENTIN 400 MG PO CAPS: 800.0000 mg | ORAL_CAPSULE | Freq: Three times a day (TID) | ORAL | 6 refills | Status: DC

## 2017-02-09 NOTE — Patient Instructions (Addendum)
I am going to increase the Neurontin from twice a day for three times per day   Your A1c has come down from 10.9 to 7.6. - GREAT JOB!  Please follow up with me in three months for your physical

## 2017-02-09 NOTE — Progress Notes (Signed)
Subjective:    Patient ID: [name], female    DOB: 1951-05-23, 66 y.o.   MRN: [id number]  HPI  66 year old female who  has a past medical history of Anemia; Anxiety; Bipolar 1 disorder ([place]); Cataracts, bilateral; Depression; Diabetes mellitus without complication ([place]); Glaucoma; History of blood transfusion; History of hyperbaric oxygen therapy; Hypercholesteremia; Hypertension; Peripheral neuropathy ([place]); and Stroke ([place]) (02/2015). She presents to the clinic today for follow up regarding hypertension and diabetes.   She has moved into a house since the last time I saw her. She reports that she is walking a lot more and is staying active. She is only using her walker when she goes out of the house. When she is inside she does not need to use it.   She also reports that " I am happy, I have accepted my disability and I am fine with it. I have stopped taking Wellbutrin and Latuda." She has not taken this medication in " months"   Lab Results  Component Value Date   HGBA1C 10.9 08/27/2016   She denies any recent falls.   Her only complaint is that of worsening Neuropathy. She is currently taking Neurotin '800mg'$  BID  Review of Systems  Constitutional: Negative.   Respiratory: Negative.   Cardiovascular: Negative.   Musculoskeletal: Positive for gait problem.  All other systems reviewed and are negative.  Past Medical History:  Diagnosis Date  . Anemia   . Anxiety   . Bipolar 1 disorder ([place])   . Cataracts, bilateral   . Depression   . Diabetes mellitus without complication ([place])   . Glaucoma   . History of blood transfusion   . History of hyperbaric oxygen therapy    pt reports 80 treatments.  . Hypercholesteremia   . Hypertension   . Peripheral neuropathy ([place])   . Stroke [facility]) 02/2015    Social History   Social History  . Marital status: Married    Spouse name: N/A  . Number of children: 3  . Years of education: 14   Occupational History  . Disbaled     Social History Main Topics  . Smoking status: Current Every Day Smoker    Packs/day: 0.01    Years: 20.00    Types: Cigarettes  . Smokeless tobacco: Never Used  . Alcohol use 0.0 oz/week     Comment: beer every 3 months  . Drug use: No  . Sexual activity: Not on file   Other Topics Concern  . Not on file   Social History Narrative   Lives at home alone.   Right-handed.   No caffeine use.   Oldest of 12 children        Past Surgical History:  Procedure Laterality Date  . AMPUTATION Right 12/15/2013   Procedure: Right Transmetatarsal Amputation;  Surgeon: Newt Minion, MD;  Location: [place];  Service: Orthopedics;  Laterality: Right;  . AMPUTATION Right 02/23/2014   Procedure: AMPUTATION FOOT;  Surgeon: Newt Minion, MD;  Location: [place];  Service: Orthopedics;  Laterality: Right;  Right Foot Revision Transmetatarsal Amputation  . I&D EXTREMITY Right 05/23/2013   Procedure: IRRIGATION AND DEBRIDEMENT RIGHT GREAT TOE;  Surgeon: Mauri Pole, MD;  [facility];  Service: Orthopedics;  Laterality: Right;    Family History  Problem Relation Age of Onset  . Colon cancer Neg Hx   . Liver cancer Mother   . Lung cancer Mother   . Hypertension Father   .  Depression Father   . Depression Mother     No Known Allergies  Current Outpatient Prescriptions on File Prior to Visit  Medication Sig Dispense Refill  . aspirin EC 325 MG EC tablet Take 1 tablet (325 mg total) by mouth daily. 30 tablet 0  . buPROPion (WELLBUTRIN SR) 150 MG 12 hr tablet take 1 tablet by mouth twice a day 90 tablet 1  . clonazePAM (KLONOPIN) 0.5 MG tablet take 1 tablet by mouth three times a day 90 tablet 0  . COD LIVER OIL PO Take 1 capsule by mouth daily.    Marland Kitchen gabapentin (NEURONTIN) 400 MG capsule take 2 capsules by mouth twice a day 120 capsule 5  . gentamicin (GARAMYCIN) 0.3 % ophthalmic solution   0  . glimepiride (AMARYL) 4 MG tablet take 1 tablet once daily 90 tablet 3  . glucose blood test  strip Test blood sugars 3 times daily Dx: E11.42 100 each 12  . ILEVRO 0.3 % ophthalmic suspension   0  . insulin aspart (NOVOLOG FLEXPEN) 100 UNIT/ML FlexPen INJECT 2-6 UNITS INTO SKIN THREE TIMES DAILY 15 pen 3  . Insulin Pen Needle (B-D UF III MINI PEN NEEDLES) 31G X 5 MM MISC Test blood sugars 3 times daily Dx: E11.42 100 each 5  . lamoTRIgine (LAMICTAL) 100 MG tablet take 1/2 tablet by mouth twice a day 30 tablet 5  . LANTUS 100 UNIT/ML injection INJECT 30 UNITS UNDER THE SKIN ONCE DAILY AT BEDTIME 10 vial 5  . latanoprost (XALATAN) 0.005 % ophthalmic solution place 1 drop into both eyes at bedtime 2.5 mL 12  . LATUDA 20 MG TABS tablet take 1 tablet by mouth once daily 30 tablet 3  . lisinopril (PRINIVIL,ZESTRIL) 20 MG tablet Take 1 tablet (20 mg total) by mouth daily. 90 tablet 3  . ondansetron (ZOFRAN) 4 MG tablet Take 1 tablet (4 mg total) by mouth every 8 (eight) hours as needed for nausea or vomiting. 30 tablet 1  . ONE TOUCH LANCETS MISC USE TO CHECK BLOOD SUGAR 3 TIMES DAILY 200 each 3  . prednisoLONE acetate (PRED FORTE) 1 % ophthalmic suspension   0  . simvastatin (ZOCOR) 40 MG tablet take 1 tablet by mouth at bedtime 30 tablet 6  . white petrolatum (VASELINE) GEL Apply 1 application topically at bedtime. Apply to both feet    . zinc gluconate 50 MG tablet Take 50 mg by mouth 2 (two) times daily.     No current facility-administered medications on file prior to visit.     BP 128/64 (BP Location: Left Arm, Patient Position: Sitting, Cuff Size: Normal)   Temp 97.8 F (36.6 C) (Oral)   Ht '5\' 5"'$  (1.651 m)   Wt 181 lb 9.6 oz (82.4 kg)   BMI 30.22 kg/m       Objective:   Physical Exam  Constitutional: She is oriented to person, place, and time. She appears well-developed and well-nourished. No distress.  She appears more happy than I have seen her in the past   Cardiovascular: Normal rate, regular rhythm, normal heart sounds and intact distal pulses.  Exam reveals no gallop.    No murmur heard. Pulmonary/Chest: Effort normal and breath sounds normal. No respiratory distress. She has no wheezes. She has no rales. She exhibits no tenderness.  Abdominal: Soft. Bowel sounds are normal. She exhibits no distension and no mass. There is no tenderness. There is no rebound and no guarding.  Musculoskeletal: Normal range of motion.  Has steady gait with rolling walker  Neurological: She is alert and oriented to person, place, and time.  Skin: Skin is warm and dry. No rash noted. She is not diaphoretic. No erythema. No pallor.  Psychiatric: She has a normal mood and affect. Her behavior is normal. Judgment and thought content normal.  Nursing note and vitals reviewed.     Assessment & Plan:  1. DM type 2 with diabetic peripheral neuropathy (HCC)  - POC HgB A1c - 7.6. Has improved from 10.9 - No change in medications at this time.  - She is going to follow up in 3 months for annual exam   2. Essential hypertension Well controlled.  - No change in medications  3. Neuropathy (HCC) - Will increase Neurontin from BID to TID  - gabapentin (NEURONTIN) 400 MG capsule; Take 2 capsules (800 mg total) by mouth 3 (three) times daily.  Dispense: 180 capsule; Refill: 6 - Follow up if no improvement   [name], NP

## 2017-02-10 LAB — POCT GLYCOSYLATED HEMOGLOBIN (HGB A1C): HEMOGLOBIN A1C: 7.6

## 2017-02-11 ENCOUNTER — Other Ambulatory Visit: Payer: Self-pay

## 2017-02-11 MED ORDER — CLONAZEPAM 0.5 MG PO TABS: 0.5000 mg | ORAL_TABLET | Freq: Three times a day (TID) | ORAL | 0 refills | Status: DC

## 2017-03-01 ENCOUNTER — Other Ambulatory Visit: Payer: Self-pay | Admitting: Adult Health

## 2017-03-02 ENCOUNTER — Ambulatory Visit: Payer: Self-pay | Admitting: Adult Health

## 2017-03-10 ENCOUNTER — Telehealth: Payer: Self-pay | Admitting: Adult Health

## 2017-03-10 NOTE — Telephone Encounter (Signed)
° ° ° °  Pt call to ask if you Tara Hopkins would call her I ask her what the reason was for the call back she said it was personal

## 2017-03-10 NOTE — Telephone Encounter (Signed)
I left message for patient to return phone call.   

## 2017-03-11 ENCOUNTER — Other Ambulatory Visit: Payer: Self-pay

## 2017-03-11 MED ORDER — BASAGLAR KWIKPEN 100 UNIT/ML ~~LOC~~ SOPN: 30.0000 [IU] | PEN_INJECTOR | Freq: Every day | SUBCUTANEOUS | 5 refills | Status: DC

## 2017-03-11 NOTE — Telephone Encounter (Signed)
Patient called in stating that her Lantus is no longer covered and price is $300.00 dollars. Could you advise on other insulin to Rx?

## 2017-03-11 NOTE — Telephone Encounter (Signed)
Basaglar has been sent in to pharmacy and patient has been notified and verbalized understanding

## 2017-03-11 NOTE — Telephone Encounter (Signed)
Ok to send in [employer]. Units are the same as Lantus

## 2017-03-17 ENCOUNTER — Encounter: Payer: Self-pay | Admitting: Adult Health

## 2017-03-17 ENCOUNTER — Other Ambulatory Visit: Payer: Self-pay | Admitting: Adult Health

## 2017-03-17 ENCOUNTER — Ambulatory Visit (INDEPENDENT_AMBULATORY_CARE_PROVIDER_SITE_OTHER): Payer: BC Managed Care – PPO | Admitting: Adult Health

## 2017-03-17 VITALS — BP 140/86 | Temp 98.2°F | Ht 65.0 in | Wt 184.3 lb

## 2017-03-17 DIAGNOSIS — E1142 Type 2 diabetes mellitus with diabetic polyneuropathy: Secondary | ICD-10-CM

## 2017-03-17 DIAGNOSIS — G629 Polyneuropathy, unspecified: Secondary | ICD-10-CM

## 2017-03-17 MED ORDER — GABAPENTIN 400 MG PO CAPS: 1200.0000 mg | ORAL_CAPSULE | Freq: Three times a day (TID) | ORAL | 1 refills | Status: DC

## 2017-03-17 NOTE — Patient Instructions (Addendum)
I am going to increase Gabapentin to 1200 mg three times per day   I am also going to increase your Basaglar to 35 units nightly.   I would like you to follow up with me in two weeks

## 2017-03-17 NOTE — Progress Notes (Signed)
Subjective:    Patient ID: Tara Hopkins, female    DOB: 01-16-1951, 66 y.o.   MRN: [id number]  HPI  1. Right arm numbness and tingling - she reports intermittent issues with this and is a chronic issue. She takes Gabapentin for neuropathy. She feels as though it is getting worse.  She reports that she has numbness and tingling that starts in her elbow and radiates down into her finger tips. She also reports that at times her fingers feel cold. She does endorse feeling as though her grip strength has decreased and she is finding that she is dropping items more often. Denies any pain in her shoulder. Pain feels like her typical neuropathic pain   2. She reports that her physical therapist is trying to get her a special pair of diabetic shoes for people that have amputations of their toes. I do think she would benefit from this shoes and will help with her stability while using a rolling walker.   3. Her blood sugars have been elevated. She reports readings in the 140's-200's. She is taking her medication as directed and is trying to watch her diet. Her last A1c was 7.6 , a month ago. This was down from 10 + prior.   Lab Results  Component Value Date   HGBA1C 7.6 02/10/2017     Review of Systems See HPI   Past Medical History:  Diagnosis Date  . Anemia   . Anxiety   . Bipolar 1 disorder ([place])   . Cataracts, bilateral   . Depression   . Diabetes mellitus without complication ([place])   . Glaucoma   . History of blood transfusion   . History of hyperbaric oxygen therapy    pt reports 80 treatments.  . Hypercholesteremia   . Hypertension   . Peripheral neuropathy   . Stroke [facility]) 02/2015    Social History   Social History  . Marital status: Married    Spouse name: N/A  . Number of children: 3  . Years of education: 14   Occupational History  . Disbaled    Social History Main Topics  . Smoking status: Current Every Day Smoker    Packs/day: 0.01    Years: 20.00   Types: Cigarettes  . Smokeless tobacco: Never Used  . Alcohol use 0.0 oz/week     Comment: beer every 3 months  . Drug use: No  . Sexual activity: Not on file   Other Topics Concern  . Not on file   Social History Narrative   Lives at home alone.   Right-handed.   No caffeine use.   Oldest of 12 children        Past Surgical History:  Procedure Laterality Date  . AMPUTATION Right 12/15/2013   Procedure: Right Transmetatarsal Amputation;  Surgeon: Newt Minion, MD;  Location: [place];  Service: Orthopedics;  Laterality: Right;  . AMPUTATION Right 02/23/2014   Procedure: AMPUTATION FOOT;  Surgeon: Newt Minion, MD;  Location: [place];  Service: Orthopedics;  Laterality: Right;  Right Foot Revision Transmetatarsal Amputation  . I&D EXTREMITY Right 05/23/2013   Procedure: IRRIGATION AND DEBRIDEMENT RIGHT GREAT TOE;  Surgeon: Mauri Pole, MD;  [facility];  Service: Orthopedics;  Laterality: Right;    Family History  Problem Relation Age of Onset  . Colon cancer Neg Hx   . Liver cancer Mother   . Lung cancer Mother   . Hypertension Father   . Depression Father   .  Depression Mother     No Known Allergies  Current Outpatient Prescriptions on File Prior to Visit  Medication Sig Dispense Refill  . aspirin EC 325 MG EC tablet Take 1 tablet (325 mg total) by mouth daily. 30 tablet 0  . clonazePAM (KLONOPIN) 0.5 MG tablet Take 1 tablet (0.5 mg total) by mouth 3 (three) times daily. 90 tablet 0  . COD LIVER OIL PO Take 1 capsule by mouth daily.    Marland Kitchen gentamicin (GARAMYCIN) 0.3 % ophthalmic solution   0  . glimepiride (AMARYL) 4 MG tablet take 1 tablet once daily 90 tablet 3  . glucose blood test strip Test blood sugars 3 times daily Dx: E11.42 100 each 12  . ILEVRO 0.3 % ophthalmic suspension   0  . insulin aspart (NOVOLOG FLEXPEN) 100 UNIT/ML FlexPen INJECT 2-6 UNITS INTO SKIN THREE TIMES DAILY 15 pen 3  . Insulin Pen Needle (B-D UF III MINI PEN NEEDLES) 31G X 5 MM MISC Use as  directed. 100 each 3  . lamoTRIgine (LAMICTAL) 100 MG tablet take 1/2 tablet by mouth twice a day 30 tablet 5  . latanoprost (XALATAN) 0.005 % ophthalmic solution place 1 drop into both eyes at bedtime 2.5 mL 12  . lisinopril (PRINIVIL,ZESTRIL) 20 MG tablet Take 1 tablet (20 mg total) by mouth daily. 90 tablet 3  . ondansetron (ZOFRAN) 4 MG tablet Take 1 tablet (4 mg total) by mouth every 8 (eight) hours as needed for nausea or vomiting. 30 tablet 1  . ONE TOUCH LANCETS MISC USE TO CHECK BLOOD SUGAR 3 TIMES DAILY 200 each 3  . prednisoLONE acetate (PRED FORTE) 1 % ophthalmic suspension   0  . simvastatin (ZOCOR) 40 MG tablet take 1 tablet by mouth at bedtime 30 tablet 6  . white petrolatum (VASELINE) GEL Apply 1 application topically at bedtime. Apply to both feet    . zinc gluconate 50 MG tablet Take 50 mg by mouth 2 (two) times daily.     No current facility-administered medications on file prior to visit.     BP 140/86 (BP Location: Left Arm, Patient Position: Sitting, Cuff Size: Normal)   Temp 98.2 F (36.8 C) (Oral)   Ht '5\' 5"'$  (1.651 m)   Wt 184 lb 4.8 oz (83.6 kg)   BMI 30.67 kg/m       Objective:   Physical Exam  Constitutional: She is oriented to person, place, and time. She appears well-developed and well-nourished. No distress.  Neck: Normal range of motion. Neck supple. No JVD present. No tracheal deviation present. No thyromegaly present.  Cardiovascular: Normal rate, regular rhythm, normal heart sounds and intact distal pulses.  Exam reveals no gallop and no friction rub.   No murmur heard. Pulmonary/Chest: Effort normal and breath sounds normal. No stridor. No respiratory distress. She has no wheezes. She has no rales. She exhibits no tenderness.  Musculoskeletal: Normal range of motion. She exhibits deformity (Partial amputation of right foot. ). She exhibits no edema or tenderness.  Steady gait with rolling walker  Good distal pulses. Good cap refill in right hand.  Hand is warm and dry. No pain with palpation   Lymphadenopathy:    She has no cervical adenopathy.  Neurological: She is alert and oriented to person, place, and time.  + 4 grip strength in bilateral hands.   Skin: Skin is warm and dry. No rash noted. She is not diaphoretic. No erythema. No pallor.  Psychiatric: She has a normal mood and  affect. Her behavior is normal. Judgment and thought content normal.  Nursing note and vitals reviewed.     Assessment & Plan:  1. Neuropathy - Does not appear as radiculopathy or carpal tunnel. No signs of vascular issues. Will increase Neurotin from 800 mg daily to 1200 mg daily.  - gabapentin (NEURONTIN) 400 MG capsule; Take 3 capsules (1,200 mg total) by mouth 3 (three) times daily.  Dispense: 270 capsule; Refill: 1 - Follow up in 2 weeks or sooner if needed  2. DM type 2 with diabetic peripheral neuropathy ([place]) - Will increase Bsaglar from 30 to 35 units nightly.  - Insulin Glargine (BASAGLAR KWIKPEN) 100 UNIT/ML SOPN; Inject 35 Units into the skin at bedtime. - She will benefit greatly from diabetic shoes. I will fax over to this note to [employer] as they requested  - Follow up in 2 weeks   [name], NP

## 2017-03-19 ENCOUNTER — Telehealth: Payer: Self-pay | Admitting: Adult Health

## 2017-03-19 NOTE — Telephone Encounter (Signed)
Patient called in stating she would like to get a Rx for a wheelchair please.  She can be reached at [phone].

## 2017-03-19 NOTE — Telephone Encounter (Signed)
Please advise 

## 2017-03-22 ENCOUNTER — Other Ambulatory Visit: Payer: Self-pay | Admitting: Adult Health

## 2017-03-23 NOTE — Telephone Encounter (Signed)
This medication was last refilled 02/11/17 for #90 with 0 refills.  Dr. Sarajane Jews, would you mind reviewing this while Tommi Rumps is out of the office?

## 2017-03-23 NOTE — Telephone Encounter (Signed)
Rx has been called in as directed.  

## 2017-03-23 NOTE — Telephone Encounter (Signed)
Rx has been signed and given to me by [facility]. Patient requested order be faxed to [place]. Rx has been faxed and patient notified.

## 2017-03-23 NOTE — Telephone Encounter (Signed)
Call in #90 with no rf 

## 2017-03-30 ENCOUNTER — Ambulatory Visit: Payer: Medicare Other | Admitting: Podiatry

## 2017-04-01 ENCOUNTER — Telehealth: Payer: Self-pay | Admitting: Adult Health

## 2017-04-01 NOTE — Telephone Encounter (Signed)
Order for wheelchair has been faxed to below fax number. Thanks!

## 2017-04-01 NOTE — Telephone Encounter (Signed)
Tara Hopkins pt calling to give you the fax information to fax the letter for her wheelchair to [place] [phone] (F)

## 2017-04-07 ENCOUNTER — Ambulatory Visit (INDEPENDENT_AMBULATORY_CARE_PROVIDER_SITE_OTHER): Payer: BC Managed Care – PPO | Admitting: Adult Health

## 2017-04-07 ENCOUNTER — Ambulatory Visit: Payer: Medicare Other | Admitting: Podiatry

## 2017-04-07 ENCOUNTER — Encounter: Payer: Self-pay | Admitting: Adult Health

## 2017-04-07 VITALS — BP 118/62 | Ht 65.0 in | Wt 187.7 lb

## 2017-04-07 DIAGNOSIS — G5601 Carpal tunnel syndrome, right upper limb: Secondary | ICD-10-CM

## 2017-04-07 DIAGNOSIS — E1140 Type 2 diabetes mellitus with diabetic neuropathy, unspecified: Secondary | ICD-10-CM

## 2017-04-07 DIAGNOSIS — E1142 Type 2 diabetes mellitus with diabetic polyneuropathy: Secondary | ICD-10-CM

## 2017-04-07 NOTE — Progress Notes (Signed)
Subjective:    Patient ID: Tara Hopkins, female    DOB: Oct 18, 1951, 66 y.o.   MRN: [id number]  HPI  66 year old female who  has a past medical history of Anemia; Anxiety; Bipolar 1 disorder ([place]); Cataracts, bilateral; Depression; Diabetes mellitus without complication ([place]); Glaucoma; History of blood transfusion; History of hyperbaric oxygen therapy; Hypercholesteremia; Hypertension; Peripheral neuropathy; and Stroke ([place]) (02/2015). He presents to the office today for two week follow up regarding neuropathy and diabetes. During her last visit Gabapentin was increased from 800 mg to 1200 mg daily and Basaglar was increased from 30 mg to 35 units nightly for elevated blood sugars.  She reports that her blood sugars have been better controlled. Currently most of her readings are between 120 - 140.   Since increasing the gabapentin she is no longer having any neuropathy pain in her lower extremities.   Her major complaint today is that of right hand/arm numbness and tingling that is worse at night. She often feels as though her hand becomes tight feeling and she cannot make a fist. This has been going on for an unknown amount of time but she feels as though it " has been awhile and is getting worse".   Lab Results  Component Value Date   HGBA1C 7.6 02/10/2017    Review of Systems See HPI   Past Medical History:  Diagnosis Date  . Anemia   . Anxiety   . Bipolar 1 disorder ([place])   . Cataracts, bilateral   . Depression   . Diabetes mellitus without complication ([place])   . Glaucoma   . History of blood transfusion   . History of hyperbaric oxygen therapy    pt reports 80 treatments.  . Hypercholesteremia   . Hypertension   . Peripheral neuropathy   . Stroke [facility]) 02/2015    Social History   Social History  . Marital status: Married    Spouse name: N/A  . Number of children: 3  . Years of education: 14   Occupational History  . Disbaled    Social History Main Topics    . Smoking status: Current Every Day Smoker    Packs/day: 0.01    Years: 20.00    Types: Cigarettes  . Smokeless tobacco: Never Used  . Alcohol use 0.0 oz/week     Comment: beer every 3 months  . Drug use: No  . Sexual activity: Not on file   Other Topics Concern  . Not on file   Social History Narrative   Lives at home alone.   Right-handed.   No caffeine use.   Oldest of 12 children        Past Surgical History:  Procedure Laterality Date  . AMPUTATION Right 12/15/2013   Procedure: Right Transmetatarsal Amputation;  Surgeon: Newt Minion, MD;  Location: [place];  Service: Orthopedics;  Laterality: Right;  . AMPUTATION Right 02/23/2014   Procedure: AMPUTATION FOOT;  Surgeon: Newt Minion, MD;  Location: [place];  Service: Orthopedics;  Laterality: Right;  Right Foot Revision Transmetatarsal Amputation  . I&D EXTREMITY Right 05/23/2013   Procedure: IRRIGATION AND DEBRIDEMENT RIGHT GREAT TOE;  Surgeon: Mauri Pole, MD;  [facility];  Service: Orthopedics;  Laterality: Right;    Family History  Problem Relation Age of Onset  . Colon cancer Neg Hx   . Liver cancer Mother   . Lung cancer Mother   . Hypertension Father   . Depression Father   .  Depression Mother     No Known Allergies  Current Outpatient Prescriptions on File Prior to Visit  Medication Sig Dispense Refill  . aspirin EC 325 MG EC tablet Take 1 tablet (325 mg total) by mouth daily. 30 tablet 0  . clonazePAM (KLONOPIN) 0.5 MG tablet take 1 tablet by mouth three times a day 90 tablet 0  . COD LIVER OIL PO Take 1 capsule by mouth daily.    Marland Kitchen gabapentin (NEURONTIN) 400 MG capsule Take 3 capsules (1,200 mg total) by mouth 3 (three) times daily. 270 capsule 1  . gabapentin (NEURONTIN) 400 MG capsule take 2 capsules by mouth twice a day 270 capsule 1  . gentamicin (GARAMYCIN) 0.3 % ophthalmic solution   0  . glimepiride (AMARYL) 4 MG tablet take 1 tablet once daily 90 tablet 3  . glucose blood test strip Test  blood sugars 3 times daily Dx: E11.42 100 each 12  . ILEVRO 0.3 % ophthalmic suspension   0  . insulin aspart (NOVOLOG FLEXPEN) 100 UNIT/ML FlexPen INJECT 2-6 UNITS INTO SKIN THREE TIMES DAILY 15 pen 3  . Insulin Glargine (BASAGLAR KWIKPEN) 100 UNIT/ML SOPN Inject 35 Units into the skin at bedtime.    . Insulin Pen Needle (B-D UF III MINI PEN NEEDLES) 31G X 5 MM MISC Use as directed. 100 each 3  . lamoTRIgine (LAMICTAL) 100 MG tablet take 1/2 tablet by mouth twice a day 30 tablet 5  . latanoprost (XALATAN) 0.005 % ophthalmic solution place 1 drop into both eyes at bedtime 2.5 mL 12  . lisinopril (PRINIVIL,ZESTRIL) 20 MG tablet Take 1 tablet (20 mg total) by mouth daily. 90 tablet 3  . ondansetron (ZOFRAN) 4 MG tablet Take 1 tablet (4 mg total) by mouth every 8 (eight) hours as needed for nausea or vomiting. 30 tablet 1  . ONE TOUCH LANCETS MISC USE TO CHECK BLOOD SUGAR 3 TIMES DAILY 200 each 3  . prednisoLONE acetate (PRED FORTE) 1 % ophthalmic suspension   0  . simvastatin (ZOCOR) 40 MG tablet take 1 tablet by mouth at bedtime 30 tablet 6  . white petrolatum (VASELINE) GEL Apply 1 application topically at bedtime. Apply to both feet    . zinc gluconate 50 MG tablet Take 50 mg by mouth 2 (two) times daily.     No current facility-administered medications on file prior to visit.     BP 118/62 (BP Location: Left Arm, Patient Position: Sitting, Cuff Size: Normal)   Ht 5\' 5"  (1.651 m)   Wt 187 lb 11.2 oz (85.1 kg)   BMI 31.23 kg/m       Objective:   Physical Exam  Constitutional: She is oriented to person, place, and time. She appears well-developed and well-nourished. No distress.  Cardiovascular: Normal rate, regular rhythm, normal heart sounds and intact distal pulses.  Exam reveals no gallop and no friction rub.   No murmur heard. Pulmonary/Chest: Effort normal and breath sounds normal. No respiratory distress. She has no wheezes. She has no rales. She exhibits no tenderness.    Musculoskeletal: She exhibits no edema, tenderness or deformity.  Neurological: She is alert and oriented to person, place, and time.  Negative Phalens and Tinel's   Skin: Skin is warm and dry. No rash noted. She is not diaphoretic. No erythema. No pallor.  Psychiatric: She has a normal mood and affect. Her behavior is normal. Judgment and thought content normal.  Nursing note and vitals reviewed.     Assessment &  Plan:  1. DM type 2 with diabetic peripheral neuropathy (HCC) - Blood sugars have improved.  - Will check A1c at her CPE in July   2. Diabetic neuropathy, painful ([place]) - better managed with increase in Gabapentin  - Continue with current dose   3. Carpal tunnel syndrome of right wrist - Advised to get wrist splint ( none in the office) and wear at night  - Follow up if no improvement  - Consider referral to sports medicine   [name], NP

## 2017-04-13 ENCOUNTER — Other Ambulatory Visit: Payer: Self-pay

## 2017-04-13 ENCOUNTER — Encounter: Payer: Self-pay | Admitting: Podiatry

## 2017-04-13 ENCOUNTER — Telehealth: Payer: Self-pay | Admitting: Adult Health

## 2017-04-13 ENCOUNTER — Ambulatory Visit (INDEPENDENT_AMBULATORY_CARE_PROVIDER_SITE_OTHER): Payer: Medicare Other | Admitting: Podiatry

## 2017-04-13 DIAGNOSIS — B351 Tinea unguium: Secondary | ICD-10-CM | POA: Diagnosis not present

## 2017-04-13 DIAGNOSIS — E1151 Type 2 diabetes mellitus with diabetic peripheral angiopathy without gangrene: Secondary | ICD-10-CM

## 2017-04-13 DIAGNOSIS — G629 Polyneuropathy, unspecified: Secondary | ICD-10-CM

## 2017-04-13 NOTE — Telephone Encounter (Signed)
Patient notified and verbalized understanding. 

## 2017-04-13 NOTE — Telephone Encounter (Signed)
Ok to change in the system. She should have 400mg  pills. She can just take one pill BID

## 2017-04-13 NOTE — Progress Notes (Signed)
Patient ID: Tara Hopkins, female   DOB: Feb 11, 1951, 66 y.o.   MRN: [id number]   Subjective: This patient presents today complaining that his toenails on the left foot are long and thick and have become progressively more deformed over the past year. Patient has attempted to trim the toenails, however, not able to trim toenails himself  Patient is diabetic and denies history of claudication History of transmetatarsal amputation right Patient is a current smoker  Objective   Orientated 3  Vascular: DP pulses 0/4 bilaterally PT pulses 0/4 bilaterally Capillary reflex within normal limits bilaterally  Neurological: Sensation to 10 g monofilament wire intact 5/5 bilaterally Vibratory sensation reactive bilaterally Ankle reflexes reactive bilaterally  Dermatological: No open skin lesions bilaterally Atrophic skin bilaterally Absent hair growth bilaterally Toenails 1-5 left are elongated, discolored, hypertrophic and tender to direct palpation  Musculoskeletal: Transmetatarsal amputation right with well-healed surgical incision Manual motor testing dorsi flexion, plantar flexion 5/5 bilaterally  Assessment: Diabetic with peripheral arterial disease Protective sensation intact 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

## 2017-04-13 NOTE — Patient Instructions (Signed)

## 2017-04-13 NOTE — Telephone Encounter (Signed)
Pt states the increase in the gabapentin (NEURONTIN) 400 MG capsule to 1200 mg did not work. Pt would like the previous rx gabapentin (NEURONTIN) 400 MG capsule BID   Sent to [facility], [place] - [place]

## 2017-04-13 NOTE — Telephone Encounter (Signed)
Is this ok?

## 2017-04-14 ENCOUNTER — Telehealth: Payer: Self-pay | Admitting: Adult Health

## 2017-04-14 DIAGNOSIS — G629 Polyneuropathy, unspecified: Secondary | ICD-10-CM

## 2017-04-14 MED ORDER — GABAPENTIN 400 MG PO CAPS: 1200.0000 mg | ORAL_CAPSULE | Freq: Three times a day (TID) | ORAL | 1 refills | Status: DC

## 2017-04-14 NOTE — Telephone Encounter (Signed)
° ° ° °  Pt call to say she need the below med call in as soon as possible she is out the med    gabapentin (NEURONTIN) 400 MG capsule   [facility] [address]

## 2017-05-03 ENCOUNTER — Other Ambulatory Visit: Payer: Self-pay | Admitting: Adult Health

## 2017-05-03 NOTE — Telephone Encounter (Signed)
° °  Pt request refill of the following:  clonazePAM (KLONOPIN) 0.5 MG tablet  gabapentin (NEURONTIN) 400 MG capsule  simvastatin (ZOCOR) 40 MG tablet  Insulin Glargine (BASAGLAR KWIKPEN) 100 UNIT/ML SOPN    Phamacy: [facility] [address]

## 2017-05-04 ENCOUNTER — Other Ambulatory Visit: Payer: Self-pay

## 2017-05-04 DIAGNOSIS — E1142 Type 2 diabetes mellitus with diabetic polyneuropathy: Secondary | ICD-10-CM

## 2017-05-04 MED ORDER — SIMVASTATIN 40 MG PO TABS: 40.0000 mg | ORAL_TABLET | Freq: Every day | ORAL | 1 refills | Status: DC

## 2017-05-04 MED ORDER — BASAGLAR KWIKPEN 100 UNIT/ML ~~LOC~~ SOPN: 35.0000 [IU] | PEN_INJECTOR | Freq: Every day | SUBCUTANEOUS | 1 refills | Status: DC

## 2017-05-04 MED ORDER — CLONAZEPAM 0.5 MG PO TABS: 0.5000 mg | ORAL_TABLET | Freq: Three times a day (TID) | ORAL | 0 refills | Status: DC

## 2017-05-04 MED ORDER — GABAPENTIN 400 MG PO CAPS: 800.0000 mg | ORAL_CAPSULE | Freq: Two times a day (BID) | ORAL | 1 refills | Status: DC

## 2017-05-04 NOTE — Telephone Encounter (Signed)
Ok to refill all meds

## 2017-05-04 NOTE — Telephone Encounter (Signed)
Ok to refill 

## 2017-05-06 NOTE — Telephone Encounter (Signed)
Rx's have been refilled as directed.

## 2017-05-13 ENCOUNTER — Encounter: Payer: Self-pay | Admitting: Adult Health

## 2017-06-01 ENCOUNTER — Other Ambulatory Visit: Payer: Self-pay | Admitting: Adult Health

## 2017-06-02 NOTE — Telephone Encounter (Signed)
Pt calling to see if the Rx clonazepam can be called in today.

## 2017-06-02 NOTE — Telephone Encounter (Signed)
Called to the pharmacy and left on machine. 

## 2017-06-02 NOTE — Telephone Encounter (Signed)
Last filled on 05/04/17 #90 Last seen on 04/07/17 Has CPX scheduled for 06/10/17 Please advise.  Thanks!!!

## 2017-06-02 NOTE — Telephone Encounter (Signed)
Ok to refill for 30 days  

## 2017-06-09 ENCOUNTER — Telehealth: Payer: Self-pay | Admitting: Adult Health

## 2017-06-09 NOTE — Telephone Encounter (Signed)
Spoke to the pt.  Her daughter will accompany her during her visit tomorrow with Tommi Rumps.  She does not wish to discuss any past medical hx in front of her daughter.  Would like to do/discuss only what is needed for the physical.  I informed her that I could have her daughter wait in the waiting room but pt states that is not necessary.  Will forward to [facility].

## 2017-06-09 NOTE — Telephone Encounter (Signed)
° ° °  Pt asked if Tommi Rumps could her today before he live. Michela Pitcher it was important that she talk to him before her appt tomorrow

## 2017-06-10 ENCOUNTER — Ambulatory Visit (INDEPENDENT_AMBULATORY_CARE_PROVIDER_SITE_OTHER): Payer: BC Managed Care – PPO | Admitting: Adult Health

## 2017-06-10 ENCOUNTER — Encounter: Payer: Self-pay | Admitting: Adult Health

## 2017-06-10 VITALS — BP 150/90 | Temp 98.0°F | Ht 65.0 in | Wt 191.0 lb

## 2017-06-10 DIAGNOSIS — I10 Essential (primary) hypertension: Secondary | ICD-10-CM

## 2017-06-10 DIAGNOSIS — E1142 Type 2 diabetes mellitus with diabetic polyneuropathy: Secondary | ICD-10-CM | POA: Diagnosis not present

## 2017-06-10 DIAGNOSIS — G5602 Carpal tunnel syndrome, left upper limb: Secondary | ICD-10-CM

## 2017-06-10 DIAGNOSIS — Z0000 Encounter for general adult medical examination without abnormal findings: Secondary | ICD-10-CM | POA: Diagnosis not present

## 2017-06-10 DIAGNOSIS — Z1159 Encounter for screening for other viral diseases: Secondary | ICD-10-CM

## 2017-06-10 DIAGNOSIS — G629 Polyneuropathy, unspecified: Secondary | ICD-10-CM | POA: Diagnosis not present

## 2017-06-10 LAB — BASIC METABOLIC PANEL
BUN: 18 mg/dL (ref 6–23)
CALCIUM: 9.3 mg/dL (ref 8.4–10.5)
CO2: 29 mEq/L (ref 19–32)
Chloride: 105 mEq/L (ref 96–112)
Creatinine, Ser: 1.35 mg/dL — ABNORMAL HIGH (ref 0.40–1.20)
GFR: 50.61 mL/min — AB (ref >60.00)
GLUCOSE: 163 mg/dL — AB (ref 70–99)
POTASSIUM: 4.6 meq/L (ref 3.5–5.1)
SODIUM: 141 meq/L (ref 135–145)

## 2017-06-10 LAB — CBC WITH DIFFERENTIAL/PLATELET
BASOS ABS: 0.1 10*3/uL (ref 0.0–0.1)
Basophils Relative: 0.6 % (ref 0.0–3.0)
EOS PCT: 3.3 % (ref 0.0–5.0)
Eosinophils Absolute: 0.3 10*3/uL (ref 0.0–0.7)
HEMATOCRIT: 37 % (ref 36.0–46.0)
Hemoglobin: 11.8 g/dL — ABNORMAL LOW (ref 12.0–15.0)
Lymphocytes Relative: 30.1 % (ref 12.0–46.0)
Lymphs Abs: 2.5 10*3/uL (ref 0.7–4.0)
MCHC: 32 g/dL (ref 30.0–36.0)
MCV: 88.4 fl (ref 78.0–100.0)
MONOS PCT: 4.2 % (ref 3.0–12.0)
Monocytes Absolute: 0.4 10*3/uL (ref 0.1–1.0)
Neutro Abs: 5.2 10*3/uL (ref 1.4–7.7)
Neutrophils Relative %: 61.8 % (ref 43.0–77.0)
Platelets: 336 10*3/uL (ref 150.0–400.0)
RBC: 4.18 Mil/uL (ref 3.87–5.11)
RDW: 14.7 % (ref 11.5–15.5)
WBC: 8.4 10*3/uL (ref 4.0–10.5)

## 2017-06-10 LAB — LIPID PANEL
CHOL/HDL RATIO: 3
CHOLESTEROL: 169 mg/dL (ref 0–200)
HDL: 50 mg/dL (ref >39.00)
NONHDL: 118.56
TRIGLYCERIDES: 223 mg/dL — AB (ref 0.0–149.0)
VLDL: 44.6 mg/dL — AB (ref 0.0–40.0)

## 2017-06-10 LAB — HEPATIC FUNCTION PANEL
ALK PHOS: 81 U/L (ref 39–117)
ALT: 15 U/L (ref 0–35)
AST: 14 U/L (ref 0–37)
Albumin: 3.9 g/dL (ref 3.5–5.2)
BILIRUBIN DIRECT: 0.1 mg/dL (ref 0.0–0.3)
TOTAL PROTEIN: 6.6 g/dL (ref 6.0–8.3)
Total Bilirubin: 0.4 mg/dL (ref 0.2–1.2)

## 2017-06-10 LAB — TSH: TSH: 2.17 u[IU]/mL (ref 0.35–4.50)

## 2017-06-10 LAB — LDL CHOLESTEROL, DIRECT: Direct LDL: 85 mg/dL

## 2017-06-10 LAB — HEMOGLOBIN A1C: Hgb A1c MFr Bld: 9.7 % — ABNORMAL HIGH (ref 4.6–6.5)

## 2017-06-10 MED ORDER — GABAPENTIN 400 MG PO CAPS: 1200.0000 mg | ORAL_CAPSULE | Freq: Three times a day (TID) | ORAL | 6 refills | Status: DC

## 2017-06-10 NOTE — Progress Notes (Signed)
Subjective:    Patient ID: Tara Hopkins, female    DOB: [birth year], 66 y.o.   MRN: [id number]  HPI Patient presents for yearly preventative medicine examination. She is a pleasant 66 year old female who  has a past medical history of Anemia; Anxiety; Bipolar 1 disorder ([place]); Cataracts, bilateral; Depression; Diabetes mellitus without complication ([place]); Glaucoma; History of blood transfusion; History of hyperbaric oxygen therapy; Hypercholesteremia; Hypertension; Peripheral neuropathy; and Stroke ([place]) (02/2015).  All immunizations and health maintenance protocols were reviewed with the patient and needed orders were placed.  Appropriate screening laboratory values were ordered for the patient including screening of hyperlipidemia, renal function and hepatic function. If indicated by BPH, a PSA was ordered.  Medication reconciliation,  past medical history, social history, problem list and allergies were reviewed in detail with the patient  Goals were established with regard to weight loss, exercise, and  diet in compliance with medications  She takes Amaryl 4 mg, Basaglar 35 units, and Novolog 2-6 units for control of diabetes. Her last A1c was 7.6 on 02/10/2017. She reports that her sugars have been between 140-160  She takes lisinopril 20 mg for hypertension control - stable. She did not take her blood pressure medication this morning.   She takes Simvastatin 40 mg and ASA 325 mg for hyperlipidemia   She is up to date on her colonoscopy. She is due for mammogram and PAP - she would like to see GYN   She continues to have right hand numbness and tingling that is worse at night. He has feels as though her hand becomes "paralized" and she cannot make a fist. She has been using a wrist splint as we discussed during her last visit  But has not noticed any changes in her symptoms.   Review of Systems  Constitutional: Negative.   HENT: Negative.   Eyes: Negative.   Respiratory:  Negative.   Cardiovascular: Negative.   Gastrointestinal: Negative.   Endocrine: Negative.   Genitourinary: Negative.   Musculoskeletal: Negative.   Skin: Negative.   Allergic/Immunologic: Negative.   Neurological: Negative.   Hematological: Negative.   Psychiatric/Behavioral: Negative.    Past Medical History:  Diagnosis Date  . Anemia   . Anxiety   . Bipolar 1 disorder ([place])   . Cataracts, bilateral   . Depression   . Diabetes mellitus without complication ([place])   . Glaucoma   . History of blood transfusion   . History of hyperbaric oxygen therapy    pt reports 80 treatments.  . Hypercholesteremia   . Hypertension   . Peripheral neuropathy   . Stroke [facility]) 02/2015    Social History   Social History  . Marital status: Married    Spouse name: N/A  . Number of children: 3  . Years of education: 14   Occupational History  . Disbaled    Social History Main Topics  . Smoking status: Current Every Day Smoker    Packs/day: 0.01    Years: 20.00    Types: Cigarettes  . Smokeless tobacco: Never Used  . Alcohol use 0.0 oz/week     Comment: beer every 3 months  . Drug use: No  . Sexual activity: Not on file   Other Topics Concern  . Not on file   Social History Narrative   Lives at home alone.   Right-handed.   No caffeine use.   Oldest of 12 children        Past Surgical History:  Procedure Laterality  Date  . AMPUTATION Right 12/15/2013   Procedure: Right Transmetatarsal Amputation;  Surgeon: Newt Minion, MD;  Location: [place];  Service: Orthopedics;  Laterality: Right;  . AMPUTATION Right 02/23/2014   Procedure: AMPUTATION FOOT;  Surgeon: Newt Minion, MD;  Location: [place];  Service: Orthopedics;  Laterality: Right;  Right Foot Revision Transmetatarsal Amputation  . I&D EXTREMITY Right 05/23/2013   Procedure: IRRIGATION AND DEBRIDEMENT RIGHT GREAT TOE;  Surgeon: Mauri Pole, MD;  Location: [facility];  Service: Orthopedics;  Laterality: Right;    Family  History  Problem Relation Age of Onset  . Liver cancer Mother   . Lung cancer Mother   . Depression Mother   . Hypertension Father   . Depression Father   . Colon cancer Neg Hx     No Known Allergies  Current Outpatient Prescriptions on File Prior to Visit  Medication Sig Dispense Refill  . aspirin EC 325 MG EC tablet Take 1 tablet (325 mg total) by mouth daily. 30 tablet 0  . clonazePAM (KLONOPIN) 0.5 MG tablet take 1 tablet by mouth three times a day 90 tablet 0  . COD LIVER OIL PO Take 1 capsule by mouth daily.    Marland Kitchen gentamicin (GARAMYCIN) 0.3 % ophthalmic solution   0  . glimepiride (AMARYL) 4 MG tablet take 1 tablet once daily 90 tablet 3  . glucose blood test strip Test blood sugars 3 times daily Dx: E11.42 100 each 12  . ILEVRO 0.3 % ophthalmic suspension   0  . Insulin Glargine (BASAGLAR KWIKPEN) 100 UNIT/ML SOPN Inject 0.35 mLs (35 Units total) into the skin at bedtime. 3 mL 1  . Insulin Pen Needle (B-D UF III MINI PEN NEEDLES) 31G X 5 MM MISC Use as directed. 100 each 3  . lamoTRIgine (LAMICTAL) 100 MG tablet take 1/2 tablet by mouth twice a day 30 tablet 5  . lisinopril (PRINIVIL,ZESTRIL) 20 MG tablet Take 1 tablet (20 mg total) by mouth daily. 90 tablet 3  . ONE TOUCH LANCETS MISC USE TO CHECK BLOOD SUGAR 3 TIMES DAILY 200 each 3  . simvastatin (ZOCOR) 40 MG tablet Take 1 tablet (40 mg total) by mouth at bedtime. 90 tablet 1  . white petrolatum (VASELINE) GEL Apply 1 application topically at bedtime. Apply to both feet    . zinc gluconate 50 MG tablet Take 50 mg by mouth 2 (two) times daily.     No current facility-administered medications on file prior to visit.     BP (!) 150/90 (BP Location: Left Arm)   Temp 98 F (36.7 C) (Oral)   Ht 5\' 5"  (1.651 m)   Wt 191 lb (86.6 kg)   BMI 31.78 kg/m       Objective:   Physical Exam  Constitutional: She is oriented to person, place, and time. She appears well-developed and well-nourished. No distress.  HENT:  Head:  Normocephalic and atraumatic.  Right Ear: External ear normal.  Left Ear: External ear normal.  Nose: Nose normal.  Mouth/Throat: Oropharynx is clear and moist. No oropharyngeal exudate.  Eyes: Pupils are equal, round, and reactive to light. Conjunctivae and EOM are normal. Right eye exhibits no discharge. Left eye exhibits no discharge. No scleral icterus.  Neck: Normal range of motion. Neck supple. No JVD present. No tracheal deviation present. No thyromegaly present.  Cardiovascular: Normal rate, regular rhythm, normal heart sounds and intact distal pulses.  Exam reveals no gallop and no friction rub.   No  murmur heard. Pulmonary/Chest: Effort normal and breath sounds normal. No stridor. No respiratory distress. She has no wheezes. She has no rales. She exhibits no tenderness.  Abdominal: Soft. Bowel sounds are normal. She exhibits no distension and no mass. There is no tenderness. There is no rebound and no guarding.  Genitourinary:  Genitourinary Comments: Will be done by GYN   Musculoskeletal: Normal range of motion. She exhibits no edema, tenderness or deformity.  Partial amputation of right foot  Uses a walker and has steady but shuffling gait   Lymphadenopathy:    She has no cervical adenopathy.  Neurological: She is alert and oriented to person, place, and time. She has normal reflexes. She displays normal reflexes. No cranial nerve deficit. She exhibits normal muscle tone. Coordination normal.  Skin: Skin is warm and dry. No rash noted. No erythema. No pallor.  Psychiatric: She has a normal mood and affect. Her behavior is normal. Judgment and thought content normal.  Nursing note and vitals reviewed.      Assessment & Plan:  1. Routine general medical examination at a health care facility - Encouraged a diabetic diet and frequent exercise - She will follow up with GYN for pap and mammogram  - Basic metabolic panel - CBC with Differential/Platelet - Hemoglobin A1c - Hepatic  function panel - Lipid panel - TSH   2. Essential hypertension - Near goal when she takes her medications  - Basic metabolic panel - CBC with Differential/Platelet - Hemoglobin A1c - Hepatic function panel - Lipid panel - TSH   3. DM type 2 with diabetic peripheral neuropathy ([place]) - Consider increasing insulin  - Basic metabolic panel - CBC with Differential/Platelet - Hemoglobin A1c - Hepatic function panel - Lipid panel - TSH   4. Encounter for hepatitis C screening test for low risk patient  - Hep C Antibody  5. Neuropathy  - gabapentin (NEURONTIN) 400 MG capsule; Take 3 capsules (1,200 mg total) by mouth 3 (three) times daily.  Dispense: 270 capsule; Refill: 6  6. Carpal tunnel syndrome of left wrist - Will refer to sports medicine for possible injection  - Ambulatory referral to [place], NP

## 2017-06-10 NOTE — Patient Instructions (Addendum)
It was great seeing you this morning.    I will follow up with you regarding your blood work   Please work on diet and exercise to lower your blood sugars  Follow up with GYN for your pap and mammogram     Health Maintenance, Female Adopting a healthy lifestyle and getting preventive care can go a long way to promote health and wellness. Talk with your health care provider about what schedule of regular examinations is right for you. This is a good chance for you to check in with your provider about disease prevention and staying healthy. In between checkups, there are plenty of things you can do on your own. Experts have done a lot of research about which lifestyle changes and preventive measures are most likely to keep you healthy. Ask your health care provider for more information. Weight and diet Eat a healthy diet  Be sure to include plenty of vegetables, fruits, low-fat dairy products, and lean protein.  Do not eat a lot of foods high in solid fats, added sugars, or salt.  Get regular exercise. This is one of the most important things you can do for your health. ? Most adults should exercise for at least 150 minutes each week. The exercise should increase your heart rate and make you sweat (moderate-intensity exercise). ? Most adults should also do strengthening exercises at least twice a week. This is in addition to the moderate-intensity exercise.  Maintain a healthy weight  Body mass index (BMI) is a measurement that can be used to identify possible weight problems. It estimates body fat based on height and weight. Your health care provider can help determine your BMI and help you achieve or maintain a healthy weight.  For females 50 years of age and older: ? A BMI below 18.5 is considered underweight. ? A BMI of 18.5 to 24.9 is normal. ? A BMI of 25 to 29.9 is considered overweight. ? A BMI of 30 and above is considered obese.  Watch levels of cholesterol and blood  lipids  You should start having your blood tested for lipids and cholesterol at 65 years of age, then have this test every 5 years.  You may need to have your cholesterol levels checked more often if: ? Your lipid or cholesterol levels are high. ? You are older than 65 years of age. ? You are at high risk for heart disease.  Cancer screening Lung Cancer  Lung cancer screening is recommended for adults 31-66 years old who are at high risk for lung cancer because of a history of smoking.  A yearly low-dose CT scan of the lungs is recommended for people who: ? Currently smoke. ? Have quit within the past 15 years. ? Have at least a 30-pack-year history of smoking. A pack year is smoking an average of one pack of cigarettes a day for 1 year.  Yearly screening should continue until it has been 15 years since you quit.  Yearly screening should stop if you develop a health problem that would prevent you from having lung cancer treatment.  Breast Cancer  Practice breast self-awareness. This means understanding how your breasts normally appear and feel.  It also means doing regular breast self-exams. Let your health care provider know about any changes, no matter how small.  If you are in your 20s or 30s, you should have a clinical breast exam (CBE) by a health care provider every 1-3 years as part of a regular health exam.  If you are 40 or older, have a CBE every year. Also consider having a breast X-ray (mammogram) every year.  If you have a family history of breast cancer, talk to your health care provider about genetic screening.  If you are at high risk for breast cancer, talk to your health care provider about having an MRI and a mammogram every year.  Breast cancer gene (BRCA) assessment is recommended for women who have family members with BRCA-related cancers. BRCA-related cancers include: ? Breast. ? Ovarian. ? Tubal. ? Peritoneal cancers.  Results of the assessment will  determine the need for genetic counseling and BRCA1 and BRCA2 testing.  Cervical Cancer Your health care provider may recommend that you be screened regularly for cancer of the pelvic organs (ovaries, uterus, and vagina). This screening involves a pelvic examination, including checking for microscopic changes to the surface of your cervix (Pap test). You may be encouraged to have this screening done every 3 years, beginning at age 78.  For women ages 39-65, health care providers may recommend pelvic exams and Pap testing every 3 years, or they may recommend the Pap and pelvic exam, combined with testing for human papilloma virus (HPV), every 5 years. Some types of HPV increase your risk of cervical cancer. Testing for HPV may also be done on women of any age with unclear Pap test results.  Other health care providers may not recommend any screening for nonpregnant women who are considered low risk for pelvic cancer and who do not have symptoms. Ask your health care provider if a screening pelvic exam is right for you.  If you have had past treatment for cervical cancer or a condition that could lead to cancer, you need Pap tests and screening for cancer for at least 20 years after your treatment. If Pap tests have been discontinued, your risk factors (such as having a new sexual partner) need to be reassessed to determine if screening should resume. Some women have medical problems that increase the chance of getting cervical cancer. In these cases, your health care provider may recommend more frequent screening and Pap tests.  Colorectal Cancer  This type of cancer can be detected and often prevented.  Routine colorectal cancer screening usually begins at 65 years of age and continues through 65 years of age.  Your health care provider may recommend screening at an earlier age if you have risk factors for colon cancer.  Your health care provider may also recommend using home test kits to check  for hidden blood in the stool.  A small camera at the end of a tube can be used to examine your colon directly (sigmoidoscopy or colonoscopy). This is done to check for the earliest forms of colorectal cancer.  Routine screening usually begins at age 71.  Direct examination of the colon should be repeated every 5-10 years through 65 years of age. However, you may need to be screened more often if early forms of precancerous polyps or small growths are found.  Skin Cancer  Check your skin from head to toe regularly.  Tell your health care provider about any new moles or changes in moles, especially if there is a change in a mole's shape or color.  Also tell your health care provider if you have a mole that is larger than the size of a pencil eraser.  Always use sunscreen. Apply sunscreen liberally and repeatedly throughout the day.  Protect yourself by wearing long sleeves, pants, a wide-brimmed hat, and  sunglasses whenever you are outside.  Heart disease, diabetes, and high blood pressure  High blood pressure causes heart disease and increases the risk of stroke. High blood pressure is more likely to develop in: ? People who have blood pressure in the high end of the normal range (130-139/85-89 mm Hg). ? People who are overweight or obese. ? People who are African American.  If you are 22-80 years of age, have your blood pressure checked every 3-5 years. If you are 14 years of age or older, have your blood pressure checked every year. You should have your blood pressure measured twice-once when you are at a hospital or clinic, and once when you are not at a hospital or clinic. Record the average of the two measurements. To check your blood pressure when you are not at a hospital or clinic, you can use: ? An automated blood pressure machine at a pharmacy. ? A home blood pressure monitor.  If you are between 19 years and 5 years old, ask your health care provider if you should take  aspirin to prevent strokes.  Have regular diabetes screenings. This involves taking a blood sample to check your fasting blood sugar level. ? If you are at a normal weight and have a low risk for diabetes, have this test once every three years after 65 years of age. ? If you are overweight and have a high risk for diabetes, consider being tested at a younger age or more often. Preventing infection Hepatitis B  If you have a higher risk for hepatitis B, you should be screened for this virus. You are considered at high risk for hepatitis B if: ? You were born in a country where hepatitis B is common. Ask your health care provider which countries are considered high risk. ? Your parents were born in a high-risk country, and you have not been immunized against hepatitis B (hepatitis B vaccine). ? You have HIV or AIDS. ? You use needles to inject street drugs. ? You live with someone who has hepatitis B. ? You have had sex with someone who has hepatitis B. ? You get hemodialysis treatment. ? You take certain medicines for conditions, including cancer, organ transplantation, and autoimmune conditions.  Hepatitis C  Blood testing is recommended for: ? Everyone born from [birth year] through 1964. ? Anyone with known risk factors for hepatitis C.  Sexually transmitted infections (STIs)  You should be screened for sexually transmitted infections (STIs) including gonorrhea and chlamydia if: ? You are sexually active and are younger than 65 years of age. ? You are older than 66 years of age and your health care provider tells you that you are at risk for this type of infection. ? Your sexual activity has changed since you were last screened and you are at an increased risk for chlamydia or gonorrhea. Ask your health care provider if you are at risk.  If you do not have HIV, but are at risk, it may be recommended that you take a prescription medicine daily to prevent HIV infection. This is called  pre-exposure prophylaxis (PrEP). You are considered at risk if: ? You are sexually active and do not regularly use condoms or know the HIV status of your partner(s). ? You take drugs by injection. ? You are sexually active with a partner who has HIV.  Talk with your health care provider about whether you are at high risk of being infected with HIV. If you choose to begin PrEP, you  should first be tested for HIV. You should then be tested every 3 months for as long as you are taking PrEP. Pregnancy  If you are premenopausal and you may become pregnant, ask your health care provider about preconception counseling.  If you may become pregnant, take 400 to 800 micrograms (mcg) of folic acid every day.  If you want to prevent pregnancy, talk to your health care provider about birth control (contraception). Osteoporosis and menopause  Osteoporosis is a disease in which the bones lose minerals and strength with aging. This can result in serious bone fractures. Your risk for osteoporosis can be identified using a bone density scan.  If you are 31 years of age or older, or if you are at risk for osteoporosis and fractures, ask your health care provider if you should be screened.  Ask your health care provider whether you should take a calcium or vitamin D supplement to lower your risk for osteoporosis.  Menopause may have certain physical symptoms and risks.  Hormone replacement therapy may reduce some of these symptoms and risks. Talk to your health care provider about whether hormone replacement therapy is right for you. Follow these instructions at home:  Schedule regular health, dental, and eye exams.  Stay current with your immunizations.  Do not use any tobacco products including cigarettes, chewing tobacco, or electronic cigarettes.  If you are pregnant, do not drink alcohol.  If you are breastfeeding, limit how much and how often you drink alcohol.  Limit alcohol intake to no more  than 1 drink per day for nonpregnant women. One drink equals 12 ounces of beer, 5 ounces of wine, or 1 ounces of hard liquor.  Do not use street drugs.  Do not share needles.  Ask your health care provider for help if you need support or information about quitting drugs.  Tell your health care provider if you often feel depressed.  Tell your health care provider if you have ever been abused or do not feel safe at home. This information is not intended to replace advice given to you by your health care provider. Make sure you discuss any questions you have with your health care provider. Document Released: 05/11/2011 Document Revised: 04/02/2016 Document Reviewed: 07/30/2015 Elsevier Interactive Patient Education  [employer].

## 2017-06-11 ENCOUNTER — Other Ambulatory Visit: Payer: Self-pay | Admitting: Adult Health

## 2017-06-11 ENCOUNTER — Telehealth: Payer: Self-pay | Admitting: Adult Health

## 2017-06-11 LAB — HEPATITIS C ANTIBODY: HCV Ab: NONREACTIVE

## 2017-06-11 NOTE — Telephone Encounter (Signed)
Pt would like blood work result  °

## 2017-06-15 NOTE — Telephone Encounter (Signed)
Returned call. See result note.

## 2017-06-17 ENCOUNTER — Encounter: Payer: Self-pay | Admitting: Family Medicine

## 2017-06-28 ENCOUNTER — Telehealth: Payer: Self-pay | Admitting: Sports Medicine

## 2017-06-28 ENCOUNTER — Ambulatory Visit: Payer: BC Managed Care – PPO | Admitting: Sports Medicine

## 2017-06-28 NOTE — Telephone Encounter (Signed)
Spoke with patient about rescheduling appointment with Dr. Paulla Fore.Patient states she will call back after moving to set up appointment. Informed patient we would not charge no show fee.

## 2017-06-29 ENCOUNTER — Other Ambulatory Visit: Payer: Self-pay | Admitting: Adult Health

## 2017-06-29 ENCOUNTER — Telehealth: Payer: Self-pay | Admitting: Adult Health

## 2017-06-29 DIAGNOSIS — G629 Polyneuropathy, unspecified: Secondary | ICD-10-CM

## 2017-06-29 MED ORDER — GABAPENTIN 400 MG PO CAPS: 1200.0000 mg | ORAL_CAPSULE | Freq: Three times a day (TID) | ORAL | 5 refills | Status: DC

## 2017-06-29 NOTE — Telephone Encounter (Signed)
Clonazepam last filled 06/02/17 #90 Seen on 06/10/17 for yearly Please advise.  Thanks!!

## 2017-06-29 NOTE — Telephone Encounter (Signed)
° ° °  Pt request refill of the following:  gabapentin (NEURONTIN) 400 MG capsule  clonazePAM (KLONOPIN) 0.5 MG tablet  Pt said she out of her med and need it today    Phamacy: [facility]

## 2017-06-29 NOTE — Telephone Encounter (Signed)
Duplicate request.  Message has been sent to [facility].  Will close this message.

## 2017-06-29 NOTE — Telephone Encounter (Signed)
Clonazepam called to the pharmacy and left on machine.  Gabapentin sent for 6 months electronically.

## 2017-06-29 NOTE — Telephone Encounter (Signed)
Ok to refill 

## 2017-07-01 ENCOUNTER — Telehealth: Payer: Self-pay

## 2017-07-01 NOTE — Telephone Encounter (Signed)
Bonita @ [facility] called to report that pt is asking for early fill on Gabapentin. Pt has reported to pharmacy that she is taking 12 tabs per day. Rx states 9 tabs per day, 3xTID. Last fill was 06/01/17 #270.   Spoke with Tommi Rumps and he authorizes fill on condition that pt returns to 3xTID and there will be no more early fills.  Spoke with [place] and advised.  Spoke with pt and advised and re-instructed on daily dosage of medication. She voiced understanding. Nothing further needed at this time.

## 2017-07-07 ENCOUNTER — Other Ambulatory Visit: Payer: Self-pay | Admitting: Adult Health

## 2017-07-07 DIAGNOSIS — E1142 Type 2 diabetes mellitus with diabetic polyneuropathy: Secondary | ICD-10-CM

## 2017-07-07 MED ORDER — BASAGLAR KWIKPEN 100 UNIT/ML ~~LOC~~ SOPN
38.0000 [IU] | PEN_INJECTOR | Freq: Every day | SUBCUTANEOUS | 6 refills | Status: DC
Start: 2017-07-07 — End: 2017-08-02

## 2017-07-07 NOTE — Telephone Encounter (Signed)
Duplicate message.  Request has been sent to [facility].  Waiting on reply.  See medication refill request.  Will close this message.

## 2017-07-07 NOTE — Telephone Encounter (Signed)
Pt states she has lost her Insulin Glargine (BASAGLAR KWIKPEN) 100 UNIT/ML SOPN  During the recent house moving and has not had any in 2 days. Would like refill sent to  [place], [place]

## 2017-07-07 NOTE — Telephone Encounter (Signed)
Pt has called back to follow up on Rx.  Advised cory is seeing pt's and will address asap.

## 2017-07-14 ENCOUNTER — Ambulatory Visit: Payer: Medicare Other | Admitting: Podiatry

## 2017-07-19 ENCOUNTER — Encounter (INDEPENDENT_AMBULATORY_CARE_PROVIDER_SITE_OTHER): Payer: Medicare Other | Admitting: Podiatry

## 2017-07-19 NOTE — Addendum Note (Signed)
Addended byDeidre Ala, Sohil Timko L on: 07/19/2017 03:23 PM   Modules accepted: Level of Service, SmartSet

## 2017-07-19 NOTE — Progress Notes (Signed)
This encounter was created in error - please disregard.

## 2017-07-20 ENCOUNTER — Inpatient Hospital Stay (HOSPITAL_COMMUNITY)
Admission: EM | Admit: 2017-07-20 | Discharge: 2017-07-23 | DRG: 065 | Disposition: A | Discharge status: Rehabilitation Facility | Payer: Medicare Other | Attending: Internal Medicine | Admitting: Internal Medicine

## 2017-07-20 ENCOUNTER — Emergency Department (HOSPITAL_COMMUNITY): Payer: Medicare Other

## 2017-07-20 ENCOUNTER — Encounter (HOSPITAL_COMMUNITY): Payer: Self-pay | Admitting: Emergency Medicine

## 2017-07-20 DIAGNOSIS — H409 Unspecified glaucoma: Secondary | ICD-10-CM | POA: Diagnosis present

## 2017-07-20 DIAGNOSIS — F419 Anxiety disorder, unspecified: Secondary | ICD-10-CM | POA: Diagnosis present

## 2017-07-20 DIAGNOSIS — E1165 Type 2 diabetes mellitus with hyperglycemia: Secondary | ICD-10-CM | POA: Diagnosis present

## 2017-07-20 DIAGNOSIS — E785 Hyperlipidemia, unspecified: Secondary | ICD-10-CM | POA: Diagnosis present

## 2017-07-20 DIAGNOSIS — Z7982 Long term (current) use of aspirin: Secondary | ICD-10-CM

## 2017-07-20 DIAGNOSIS — Z79899 Other long term (current) drug therapy: Secondary | ICD-10-CM

## 2017-07-20 DIAGNOSIS — Z7289 Other problems related to lifestyle: Secondary | ICD-10-CM

## 2017-07-20 DIAGNOSIS — E1122 Type 2 diabetes mellitus with diabetic chronic kidney disease: Secondary | ICD-10-CM | POA: Diagnosis present

## 2017-07-20 DIAGNOSIS — I69354 Hemiplegia and hemiparesis following cerebral infarction affecting left non-dominant side: Secondary | ICD-10-CM

## 2017-07-20 DIAGNOSIS — Z6830 Body mass index (BMI) 30.0-30.9, adult: Secondary | ICD-10-CM

## 2017-07-20 DIAGNOSIS — Z89439 Acquired absence of unspecified foot: Secondary | ICD-10-CM

## 2017-07-20 DIAGNOSIS — N183 Chronic kidney disease, stage 3 (moderate): Secondary | ICD-10-CM | POA: Diagnosis present

## 2017-07-20 DIAGNOSIS — E1142 Type 2 diabetes mellitus with diabetic polyneuropathy: Secondary | ICD-10-CM | POA: Diagnosis present

## 2017-07-20 DIAGNOSIS — I739 Peripheral vascular disease, unspecified: Secondary | ICD-10-CM | POA: Diagnosis present

## 2017-07-20 DIAGNOSIS — R4781 Slurred speech: Secondary | ICD-10-CM | POA: Diagnosis present

## 2017-07-20 DIAGNOSIS — I6789 Other cerebrovascular disease: Secondary | ICD-10-CM | POA: Diagnosis not present

## 2017-07-20 DIAGNOSIS — Z794 Long term (current) use of insulin: Secondary | ICD-10-CM

## 2017-07-20 DIAGNOSIS — F17200 Nicotine dependence, unspecified, uncomplicated: Secondary | ICD-10-CM

## 2017-07-20 DIAGNOSIS — E118 Type 2 diabetes mellitus with unspecified complications: Secondary | ICD-10-CM

## 2017-07-20 DIAGNOSIS — I708 Atherosclerosis of other arteries: Secondary | ICD-10-CM | POA: Diagnosis present

## 2017-07-20 DIAGNOSIS — F17210 Nicotine dependence, cigarettes, uncomplicated: Secondary | ICD-10-CM | POA: Diagnosis present

## 2017-07-20 DIAGNOSIS — N179 Acute kidney failure, unspecified: Secondary | ICD-10-CM | POA: Diagnosis present

## 2017-07-20 DIAGNOSIS — I639 Cerebral infarction, unspecified: Principal | ICD-10-CM | POA: Diagnosis present

## 2017-07-20 DIAGNOSIS — F1410 Cocaine abuse, uncomplicated: Secondary | ICD-10-CM | POA: Diagnosis present

## 2017-07-20 DIAGNOSIS — G8929 Other chronic pain: Secondary | ICD-10-CM | POA: Diagnosis present

## 2017-07-20 DIAGNOSIS — I129 Hypertensive chronic kidney disease with stage 1 through stage 4 chronic kidney disease, or unspecified chronic kidney disease: Secondary | ICD-10-CM | POA: Diagnosis present

## 2017-07-20 DIAGNOSIS — F1210 Cannabis abuse, uncomplicated: Secondary | ICD-10-CM | POA: Diagnosis present

## 2017-07-20 DIAGNOSIS — I10 Essential (primary) hypertension: Secondary | ICD-10-CM | POA: Diagnosis present

## 2017-07-20 DIAGNOSIS — R29704 NIHSS score 4: Secondary | ICD-10-CM | POA: Diagnosis present

## 2017-07-20 DIAGNOSIS — D62 Acute posthemorrhagic anemia: Secondary | ICD-10-CM | POA: Diagnosis present

## 2017-07-20 DIAGNOSIS — R531 Weakness: Secondary | ICD-10-CM | POA: Diagnosis not present

## 2017-07-20 DIAGNOSIS — F1910 Other psychoactive substance abuse, uncomplicated: Secondary | ICD-10-CM | POA: Diagnosis present

## 2017-07-20 DIAGNOSIS — I63331 Cerebral infarction due to thrombosis of right posterior cerebral artery: Secondary | ICD-10-CM

## 2017-07-20 DIAGNOSIS — F319 Bipolar disorder, unspecified: Secondary | ICD-10-CM | POA: Diagnosis present

## 2017-07-20 DIAGNOSIS — E1151 Type 2 diabetes mellitus with diabetic peripheral angiopathy without gangrene: Secondary | ICD-10-CM | POA: Diagnosis present

## 2017-07-20 DIAGNOSIS — E669 Obesity, unspecified: Secondary | ICD-10-CM | POA: Diagnosis present

## 2017-07-20 NOTE — ED Triage Notes (Signed)
Per EMS, pt from home. LSN 2030 on 07/19/17. Pt woke up at 0500 on 9/11 with increased left extremity weakness, pt reports rolling out of bed onto floor. Hx of stroke with left sided weakness deficits. Pt reported slurred speech at 3pm. Denies hitting head and denies taking blood thinners. Hx diabetes, CBG with EMS 70 with her target BS at 160. EMS gave pt some food and CBG went up to 115 plus slurred speech and weakness sx decreased. Pt took her Klonopin at 2100. EMS VS 177/88, P 69, 16 R.

## 2017-07-20 NOTE — ED Provider Notes (Signed)
[place] DEPT Provider Note   CSN: [id number] Arrival date & time: 07/20/17  2300     History   Chief Complaint Chief Complaint  Patient presents with  . Extremity Weakness    left side     HPI Tara Hopkins is a 66 y.o. female.  Patient brought to the emergency department by ambulance for possible stroke. Patient reports that when she woke up this morning she noticed difficulty with speech in the form of slurring. She then took a nap midday and then woke up around 3 PM and noticed the left side of her body was weak. She reports that she thought it would get better, but when it did not she called the ambulance.  At arrival to the ER, patient reports that her symptoms are resolving. She does not have any headache. She denies chest pain, shortness of breath, palpitations, syncope.      Past Medical History:  Diagnosis Date  . Anemia   . Anxiety   . Bipolar 1 disorder ([place])   . Cataracts, bilateral   . Depression   . Diabetes mellitus without complication ([place])   . Glaucoma   . History of blood transfusion   . History of hyperbaric oxygen therapy    pt reports 80 treatments.  . Hypercholesteremia   . Hypertension   . Peripheral neuropathy   . Stroke [facility]) 02/2015    Patient Active Problem List   Diagnosis Date Noted  . Bipolar disorder ([place]) 09/09/2015  . Left hemiparesis ([place]) 02/20/2015  . Cocaine abuse   . Stroke ([place]) 02/15/2015  . Osteomyelitis of foot, right, acute ([place]) 02/23/2014  . 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 DM type 2 with diabetic peripheral neuropathy ([place]) 05/21/2013  . Essential hypertension 05/21/2013  . Depression 05/21/2013  . Anemia 05/21/2013  . Renal insufficiency 05/21/2013    Past Surgical History:  Procedure Laterality Date  . AMPUTATION Right 12/15/2013   Procedure: Right Transmetatarsal Amputation;  Surgeon: Newt Minion, MD;  Location: [place];  Service: Orthopedics;  Laterality:  Right;  . AMPUTATION Right 02/23/2014   Procedure: AMPUTATION FOOT;  Surgeon: Newt Minion, MD;  Location: [place];  Service: Orthopedics;  Laterality: Right;  Right Foot Revision Transmetatarsal Amputation  . I&D EXTREMITY Right 05/23/2013   Procedure: IRRIGATION AND DEBRIDEMENT RIGHT GREAT TOE;  Surgeon: Mauri Pole, MD;  Location: [facility];  Service: Orthopedics;  Laterality: Right;    OB History    No data available       Home Medications    Prior to Admission medications   Medication Sig Start Date End Date Taking? Authorizing Provider  clonazePAM (KLONOPIN) 0.5 MG tablet take 1 tablet by mouth three times a day 06/29/17  Yes Nafziger, Tommi Rumps, NP  gabapentin (NEURONTIN) 400 MG capsule Take 3 capsules (1,200 mg total) by mouth 3 (three) times daily. 06/29/17 07/29/17 Yes Nafziger, Tommi Rumps, NP  glimepiride (AMARYL) 4 MG tablet take 1 tablet once daily 12/01/16  Yes Nafziger, Tommi Rumps, NP  Insulin Glargine (BASAGLAR KWIKPEN) 100 UNIT/ML SOPN Inject 0.38 mLs (38 Units total) into the skin at bedtime. 07/07/17  Yes Nafziger, Tommi Rumps, NP  lisinopril (PRINIVIL,ZESTRIL) 20 MG tablet Take 1 tablet (20 mg total) by mouth daily. 11/12/15  Yes Nafziger, Tommi Rumps, NP  simvastatin (ZOCOR) 40 MG tablet Take 1 tablet (40 mg total) by mouth at bedtime. 05/04/17  Yes Nafziger, Tommi Rumps, NP  aspirin EC 325 MG EC tablet Take 1 tablet (325 mg  total) by mouth daily. 03/08/15   Love, Ivan Anchors, PA-C  COD LIVER OIL PO Take 1 capsule by mouth daily.    [provider]  glucose blood test strip Test blood sugars 3 times daily Dx: E11.42 03/26/16   Nafziger, Tommi Rumps, NP  Insulin Pen Needle (B-D UF III MINI PEN NEEDLES) 31G X 5 MM MISC Use as directed. 03/01/17   Nafziger, Tommi Rumps, NP  lamoTRIgine (LAMICTAL) 100 MG tablet take 1/2 tablet by mouth twice a day Patient not taking: Reported on 07/21/2017 12/01/16   [name], NP  ONE TOUCH LANCETS [place] USE TO CHECK BLOOD SUGAR 3 TIMES DAILY 03/26/16   [name], NP    Family  History Family History  Problem Relation Age of Onset  . Liver cancer Mother   . Lung cancer Mother   . Depression Mother   . Hypertension Father   . Depression Father   . Colon cancer Neg Hx     Social History Social History  Substance Use Topics  . Smoking status: Current Every Day Smoker    Packs/day: 0.01    Years: 20.00    Types: Cigarettes  . Smokeless tobacco: Never Used  . Alcohol use 0.0 oz/week     Comment: beer every 3 months     Allergies   Patient has no known allergies.   Review of Systems Review of Systems  Neurological: Positive for speech difficulty and weakness.  All other systems reviewed and are negative.    Physical Exam Updated Vital Signs BP (!) 141/75   Pulse 63   Temp 98 F (36.7 C) (Oral)   Resp 11   Ht 5\' 5"  (1.651 m)   Wt 82.1 kg (181 lb)   SpO2 97%   BMI 30.12 kg/m   Physical Exam  Constitutional: She is oriented to person, place, and time. She appears well-developed and well-nourished. No distress.  HENT:  Head: Normocephalic and atraumatic.  Right Ear: Hearing normal.  Left Ear: Hearing normal.  Nose: Nose normal.  Mouth/Throat: Oropharynx is clear and moist and mucous membranes are normal.  Eyes: Pupils are equal, round, and reactive to light. Conjunctivae and EOM are normal.  Neck: Normal range of motion. Neck supple.  Cardiovascular: Regular rhythm, S1 normal and S2 normal.  Exam reveals no gallop and no friction rub.   No murmur heard. Pulmonary/Chest: Effort normal and breath sounds normal. No respiratory distress. She exhibits no tenderness.  Abdominal: Soft. Normal appearance and bowel sounds are normal. There is no hepatosplenomegaly. There is no tenderness. There is no rebound, no guarding, no tenderness at McBurney's point and negative Murphy's sign. No hernia.  Neurological: She is alert and oriented to person, place, and time. She has normal strength. No cranial nerve deficit or sensory deficit. Coordination  normal. GCS eye subscore is 4. GCS verbal subscore is 5. GCS motor subscore is 6.  Skin: Skin is warm, dry and intact. No rash noted. No cyanosis.  Psychiatric: She has a normal mood and affect. Her speech is normal and behavior is normal. Thought content normal.  Nursing note and vitals reviewed.    ED Treatments / Results  Labs (all labs ordered are listed, but only abnormal results are displayed) Labs Reviewed  CBC - Abnormal; Notable for the following:       Result Value   Hemoglobin 11.1 (*)    HCT 35.4 (*)    All other components within normal limits  COMPREHENSIVE METABOLIC PANEL - Abnormal; Notable for  the following:    Glucose, Bld 110 (*)    Creatinine, Ser 1.46 (*)    Total Protein 6.2 (*)    Albumin 3.1 (*)    GFR calc non Af Amer 37 (*)    GFR calc Af Amer 42 (*)    All other components within normal limits  RAPID URINE DRUG SCREEN, HOSP PERFORMED - Abnormal; Notable for the following:    Tetrahydrocannabinol POSITIVE (*)    All other components within normal limits  URINALYSIS, ROUTINE W REFLEX MICROSCOPIC - Abnormal; Notable for the following:    Protein, ur >=300 (*)    All other components within normal limits  ETHANOL  DIFFERENTIAL  PROTIME-INR  APTT  I-STAT TROPONIN, ED    EKG  EKG Interpretation  Date/Time:  Tuesday July 20 2017 23:47:18 EDT Ventricular Rate:  70 PR Interval:    QRS Duration: 87 QT Interval:  410 QTC Calculation: 443 R Axis:   29 Text Interpretation:  Sinus rhythm Abnormal T, consider ischemia, lateral leads Minimal ST elevation, anterior leads Confirmed by Orpah Greek [phone]) on 07/20/2017 11:49:15 PM       Radiology Ct Head Wo Contrast  Result Date: 07/20/2017 CLINICAL DATA:  Focal neurologic deficit for greater than 6 hours, stroke suspected. Left-sided weakness. EXAM: CT HEAD WITHOUT CONTRAST TECHNIQUE: Contiguous axial images were obtained from the base of the skull through the vertex without intravenous  contrast. COMPARISON:  Head CT 07/17/2015 FINDINGS: Brain: No acute hemorrhage. No evidence of acute territorial ischemia. Generalized atrophy and chronic small vessel ischemic changes, stable from prior. Remote lacunar infarct in the periventricular white matter, unchanged. No hydrocephalus, midline shift or mass effect. Vascular: Atherosclerosis of skullbase vasculature without hyperdense vessel or abnormal calcification. Skull: No skull fracture or focal lesion. Sinuses/Orbits: Frontal sinuses and left mastoid air cells are hypoplastic. No acute finding. No inflammatory change. Bilateral cataract resection. Other: None. IMPRESSION: 1. No acute intracranial abnormality. No evidence of acute ischemia on noncontrast head CT. 2. Stable atrophy, chronic small vessel ischemia and remote lacunar infarct. Electronically Signed   By: Jeb Levering M.D.   On: 07/20/2017 23:58   Mr Brain Wo Contrast  Result Date: 07/21/2017 CLINICAL DATA:  66 y/o F; increase left extremity weakness and slurred speech. History of stroke with left-sided deficits. EXAM: MRI HEAD WITHOUT CONTRAST TECHNIQUE: Multiplanar, multiecho pulse sequences of the brain and surrounding structures were obtained without intravenous contrast. COMPARISON:  07/20/2017 CT of the head FINDINGS: Brain: Subcentimeter focus of reduced diffusion in the right posterior limb of internal capsule. Mild associated T2 hyperintensity. No acute hemorrhage. Moderate chronic microvascular ischemic changes and mild brain parenchymal volume loss. Chronic lacunar infarcts within the right paramedian pons, bilateral thalamus, left mid corona radiata, and right posterior corona radiata. No abnormal susceptibility hypointensity to indicate intracranial hemorrhage. No hydrocephalus or effacement of basilar cisterns. No focal mass effect. Vascular:  Normal flow voids. Skull and upper cervical spine: Normal marrow signal. Sinuses/Orbits: Negative. Other: None. IMPRESSION: 1.  Subcentimeter acute/early subacute infarction in right posterior limb of internal capsule. No acute hemorrhage. 2. Moderate chronic microvascular ischemic changes and moderate parenchymal volume loss of the brain. Chronic lacunar infarcts of basal ganglia and pons. These results will be called to the ordering clinician or representative by the Radiologist Assistant, and communication documented in the PACS or zVision Dashboard. Electronically Signed   By: Kristine Garbe M.D.   On: 07/21/2017 04:59    Procedures Procedures (including critical care time)  Medications  Ordered in ED Medications - No data to display   Initial Impression / Assessment and Plan / ED Course  I have reviewed the triage vital signs and the nursing notes.  Pertinent labs & imaging results that were available during my care of the patient were reviewed by me and considered in my medical decision making (see chart for details).     Patient presents to the emergency department for evaluation of left-sided weakness. Patient reports increased slurred speech upon awakening earlier this morning. She then awakened from a nap around 3 PM with increased weakness of the left arm and left leg. Patient presented to the ER approximately 9 hours after this. She was never a candidate for TPA based on chronicity of her symptoms.  Initial workup was negative. She had subtle left-sided deficits but reported that they were improving. An MRI was performed to further evaluate. This shows evidence of small lacunar infarct that might explain her current symptoms. She will therefore require hospitalization for further management.  Final Clinical Impressions(s) / ED Diagnoses   Final diagnoses:  Cerebrovascular accident (CVA), unspecified mechanism ([place])    New Prescriptions New Prescriptions   No medications on file     Orpah Greek, MD 07/21/17 [phone]

## 2017-07-20 NOTE — ED Notes (Signed)
Patient transported to CT 

## 2017-07-20 NOTE — ED Notes (Signed)
ED Provider at bedside. 

## 2017-07-21 ENCOUNTER — Inpatient Hospital Stay (HOSPITAL_COMMUNITY): Payer: Medicare Other

## 2017-07-21 ENCOUNTER — Emergency Department (HOSPITAL_COMMUNITY): Payer: Medicare Other

## 2017-07-21 DIAGNOSIS — F1490 Cocaine use, unspecified, uncomplicated: Secondary | ICD-10-CM | POA: Diagnosis present

## 2017-07-21 DIAGNOSIS — I361 Nonrheumatic tricuspid (valve) insufficiency: Secondary | ICD-10-CM | POA: Diagnosis not present

## 2017-07-21 DIAGNOSIS — F17210 Nicotine dependence, cigarettes, uncomplicated: Secondary | ICD-10-CM | POA: Diagnosis present

## 2017-07-21 DIAGNOSIS — Z7982 Long term (current) use of aspirin: Secondary | ICD-10-CM | POA: Diagnosis not present

## 2017-07-21 DIAGNOSIS — I739 Peripheral vascular disease, unspecified: Secondary | ICD-10-CM | POA: Diagnosis present

## 2017-07-21 DIAGNOSIS — I639 Cerebral infarction, unspecified: Secondary | ICD-10-CM | POA: Diagnosis not present

## 2017-07-21 DIAGNOSIS — E7800 Pure hypercholesterolemia, unspecified: Secondary | ICD-10-CM | POA: Diagnosis present

## 2017-07-21 DIAGNOSIS — I10 Essential (primary) hypertension: Secondary | ICD-10-CM

## 2017-07-21 DIAGNOSIS — I340 Nonrheumatic mitral (valve) insufficiency: Secondary | ICD-10-CM

## 2017-07-21 DIAGNOSIS — N179 Acute kidney failure, unspecified: Secondary | ICD-10-CM | POA: Diagnosis present

## 2017-07-21 DIAGNOSIS — I69354 Hemiplegia and hemiparesis following cerebral infarction affecting left non-dominant side: Secondary | ICD-10-CM | POA: Diagnosis present

## 2017-07-21 DIAGNOSIS — E1165 Type 2 diabetes mellitus with hyperglycemia: Secondary | ICD-10-CM | POA: Diagnosis present

## 2017-07-21 DIAGNOSIS — R4781 Slurred speech: Secondary | ICD-10-CM | POA: Diagnosis not present

## 2017-07-21 DIAGNOSIS — G8929 Other chronic pain: Secondary | ICD-10-CM | POA: Diagnosis present

## 2017-07-21 DIAGNOSIS — E1151 Type 2 diabetes mellitus with diabetic peripheral angiopathy without gangrene: Secondary | ICD-10-CM | POA: Diagnosis present

## 2017-07-21 DIAGNOSIS — Z23 Encounter for immunization: Secondary | ICD-10-CM | POA: Diagnosis not present

## 2017-07-21 DIAGNOSIS — F1210 Cannabis abuse, uncomplicated: Secondary | ICD-10-CM | POA: Diagnosis present

## 2017-07-21 DIAGNOSIS — M542 Cervicalgia: Secondary | ICD-10-CM | POA: Diagnosis not present

## 2017-07-21 DIAGNOSIS — I69398 Other sequelae of cerebral infarction: Secondary | ICD-10-CM | POA: Diagnosis not present

## 2017-07-21 DIAGNOSIS — E669 Obesity, unspecified: Secondary | ICD-10-CM | POA: Diagnosis present

## 2017-07-21 DIAGNOSIS — E118 Type 2 diabetes mellitus with unspecified complications: Secondary | ICD-10-CM | POA: Diagnosis not present

## 2017-07-21 DIAGNOSIS — E785 Hyperlipidemia, unspecified: Secondary | ICD-10-CM | POA: Diagnosis present

## 2017-07-21 DIAGNOSIS — F319 Bipolar disorder, unspecified: Secondary | ICD-10-CM | POA: Diagnosis present

## 2017-07-21 DIAGNOSIS — Z89431 Acquired absence of right foot: Secondary | ICD-10-CM | POA: Diagnosis not present

## 2017-07-21 DIAGNOSIS — G8191 Hemiplegia, unspecified affecting right dominant side: Secondary | ICD-10-CM | POA: Diagnosis not present

## 2017-07-21 DIAGNOSIS — Z79899 Other long term (current) drug therapy: Secondary | ICD-10-CM | POA: Diagnosis not present

## 2017-07-21 DIAGNOSIS — I63331 Cerebral infarction due to thrombosis of right posterior cerebral artery: Secondary | ICD-10-CM | POA: Diagnosis not present

## 2017-07-21 DIAGNOSIS — N183 Chronic kidney disease, stage 3 (moderate): Secondary | ICD-10-CM | POA: Diagnosis present

## 2017-07-21 DIAGNOSIS — G8194 Hemiplegia, unspecified affecting left nondominant side: Secondary | ICD-10-CM | POA: Diagnosis not present

## 2017-07-21 DIAGNOSIS — H409 Unspecified glaucoma: Secondary | ICD-10-CM | POA: Diagnosis present

## 2017-07-21 DIAGNOSIS — I63311 Cerebral infarction due to thrombosis of right middle cerebral artery: Secondary | ICD-10-CM | POA: Diagnosis not present

## 2017-07-21 DIAGNOSIS — F419 Anxiety disorder, unspecified: Secondary | ICD-10-CM | POA: Diagnosis present

## 2017-07-21 DIAGNOSIS — F1910 Other psychoactive substance abuse, uncomplicated: Secondary | ICD-10-CM | POA: Diagnosis present

## 2017-07-21 DIAGNOSIS — I708 Atherosclerosis of other arteries: Secondary | ICD-10-CM | POA: Diagnosis present

## 2017-07-21 DIAGNOSIS — E1122 Type 2 diabetes mellitus with diabetic chronic kidney disease: Secondary | ICD-10-CM | POA: Diagnosis present

## 2017-07-21 DIAGNOSIS — I6300 Cerebral infarction due to thrombosis of unspecified precerebral artery: Secondary | ICD-10-CM | POA: Diagnosis not present

## 2017-07-21 DIAGNOSIS — E1142 Type 2 diabetes mellitus with diabetic polyneuropathy: Secondary | ICD-10-CM | POA: Diagnosis present

## 2017-07-21 DIAGNOSIS — R531 Weakness: Secondary | ICD-10-CM | POA: Diagnosis not present

## 2017-07-21 DIAGNOSIS — F17200 Nicotine dependence, unspecified, uncomplicated: Secondary | ICD-10-CM | POA: Diagnosis not present

## 2017-07-21 DIAGNOSIS — I129 Hypertensive chronic kidney disease with stage 1 through stage 4 chronic kidney disease, or unspecified chronic kidney disease: Secondary | ICD-10-CM | POA: Diagnosis present

## 2017-07-21 DIAGNOSIS — Z794 Long term (current) use of insulin: Secondary | ICD-10-CM | POA: Diagnosis not present

## 2017-07-21 DIAGNOSIS — R269 Unspecified abnormalities of gait and mobility: Secondary | ICD-10-CM | POA: Diagnosis not present

## 2017-07-21 DIAGNOSIS — D62 Acute posthemorrhagic anemia: Secondary | ICD-10-CM | POA: Diagnosis present

## 2017-07-21 DIAGNOSIS — R29704 NIHSS score 4: Secondary | ICD-10-CM | POA: Diagnosis present

## 2017-07-21 DIAGNOSIS — F1410 Cocaine abuse, uncomplicated: Secondary | ICD-10-CM | POA: Diagnosis present

## 2017-07-21 LAB — I-STAT TROPONIN, ED: Troponin i, poc: 0.02 ng/mL (ref 0.00–0.08)

## 2017-07-21 LAB — CBC
HCT: 35.4 % — ABNORMAL LOW (ref 36.0–46.0)
Hemoglobin: 11.1 g/dL — ABNORMAL LOW (ref 12.0–15.0)
MCH: 27.6 pg (ref 26.0–34.0)
MCHC: 31.4 g/dL (ref 30.0–36.0)
MCV: 88.1 fL (ref 78.0–100.0)
PLATELETS: 314 10*3/uL (ref 150–400)
RBC: 4.02 MIL/uL (ref 3.87–5.11)
RDW: 14.9 % (ref 11.5–15.5)
WBC: 10.2 10*3/uL (ref 4.0–10.5)

## 2017-07-21 LAB — DIFFERENTIAL
BASOS ABS: 0 10*3/uL (ref 0.0–0.1)
BASOS PCT: 0 %
Eosinophils Absolute: 0.3 10*3/uL (ref 0.0–0.7)
Eosinophils Relative: 3 %
LYMPHS PCT: 32 %
Lymphs Abs: 3.3 10*3/uL (ref 0.7–4.0)
MONO ABS: 0.5 10*3/uL (ref 0.1–1.0)
Monocytes Relative: 5 %
NEUTROS ABS: 6.1 10*3/uL (ref 1.7–7.7)
Neutrophils Relative %: 60 %

## 2017-07-21 LAB — COMPREHENSIVE METABOLIC PANEL
ALBUMIN: 3.1 g/dL — AB (ref 3.5–5.0)
ALK PHOS: 67 U/L (ref 38–126)
ALT: 15 U/L (ref 14–54)
AST: 19 U/L (ref 15–41)
Anion gap: 8 (ref 5–15)
BUN: 18 mg/dL (ref 6–20)
CALCIUM: 9.1 mg/dL (ref 8.9–10.3)
CHLORIDE: 110 mmol/L (ref 101–111)
CO2: 24 mmol/L (ref 22–32)
Creatinine, Ser: 1.46 mg/dL — ABNORMAL HIGH (ref 0.44–1.00)
GFR calc non Af Amer: 37 mL/min — ABNORMAL LOW (ref >60)
GFR, EST AFRICAN AMERICAN: 42 mL/min — AB (ref >60)
GLUCOSE: 110 mg/dL — AB (ref 65–99)
Potassium: 3.7 mmol/L (ref 3.5–5.1)
SODIUM: 142 mmol/L (ref 135–145)
Total Bilirubin: 0.4 mg/dL (ref 0.3–1.2)
Total Protein: 6.2 g/dL — ABNORMAL LOW (ref 6.5–8.1)

## 2017-07-21 LAB — CBG MONITORING, ED
GLUCOSE-CAPILLARY: 53 mg/dL — AB (ref 65–99)
GLUCOSE-CAPILLARY: 61 mg/dL — AB (ref 65–99)
GLUCOSE-CAPILLARY: 206 mg/dL — AB (ref 65–99)
Glucose-Capillary: 170 mg/dL — ABNORMAL HIGH (ref 65–99)

## 2017-07-21 LAB — PROTIME-INR
INR: 1.02
Prothrombin Time: 13.3 seconds (ref 11.4–15.2)

## 2017-07-21 LAB — HEMOGLOBIN A1C
HEMOGLOBIN A1C: 9.2 % — AB (ref 4.8–5.6)
Mean Plasma Glucose: 217.34 mg/dL

## 2017-07-21 LAB — RAPID URINE DRUG SCREEN, HOSP PERFORMED
Amphetamines: NOT DETECTED
BENZODIAZEPINES: NOT DETECTED
Barbiturates: NOT DETECTED
Cocaine: NOT DETECTED
Opiates: NOT DETECTED
TETRAHYDROCANNABINOL: POSITIVE — AB

## 2017-07-21 LAB — GLUCOSE, CAPILLARY
Glucose-Capillary: 143 mg/dL — ABNORMAL HIGH (ref 65–99)
Glucose-Capillary: 120 mg/dL — ABNORMAL HIGH (ref 65–99)

## 2017-07-21 LAB — URINALYSIS, ROUTINE W REFLEX MICROSCOPIC
Bacteria, UA: NONE SEEN
Bilirubin Urine: NEGATIVE
GLUCOSE, UA: NEGATIVE mg/dL
HGB URINE DIPSTICK: NEGATIVE
Ketones, ur: NEGATIVE mg/dL
Leukocytes, UA: NEGATIVE
NITRITE: NEGATIVE
Protein, ur: >=300 mg/dL — AB
SPECIFIC GRAVITY, URINE: 1.017 (ref 1.005–1.030)
Squamous Epithelial / LPF: NONE SEEN
pH: 5 (ref 5.0–8.0)

## 2017-07-21 LAB — ECHOCARDIOGRAM COMPLETE
HEIGHTINCHES: 65 in
Weight: 2966.51 oz

## 2017-07-21 LAB — HIV ANTIBODY (ROUTINE TESTING W REFLEX): HIV SCREEN 4TH GENERATION: NONREACTIVE

## 2017-07-21 LAB — ETHANOL

## 2017-07-21 LAB — APTT: APTT: 32 s (ref 24–36)

## 2017-07-21 MED ORDER — ACETAMINOPHEN 325 MG PO TABS
650.0000 mg | ORAL_TABLET | Freq: Four times a day (QID) | ORAL | Status: DC | PRN
  Administered 2017-07-22: 650 mg via ORAL
  Filled 2017-07-21: qty 2

## 2017-07-21 MED ORDER — ATORVASTATIN CALCIUM 40 MG PO TABS
40.0000 mg | ORAL_TABLET | Freq: Every day | ORAL | Status: DC
  Administered 2017-07-21 – 2017-07-22 (×2): 40 mg via ORAL
  Filled 2017-07-21 (×3): qty 1

## 2017-07-21 MED ORDER — STROKE: EARLY STAGES OF RECOVERY BOOK
Freq: Once | Status: AC
Start: 2017-07-21 — End: 2017-07-23
  Administered 2017-07-23: 09:00:00
  Filled 2017-07-21: qty 1

## 2017-07-21 MED ORDER — INSULIN ASPART 100 UNIT/ML ~~LOC~~ SOLN: 0.0000 [IU] | Freq: Every day | SUBCUTANEOUS | Status: DC

## 2017-07-21 MED ORDER — HYDRALAZINE HCL 20 MG/ML IJ SOLN: 5.0000 mg | INTRAMUSCULAR | Status: DC | PRN

## 2017-07-21 MED ORDER — ONDANSETRON HCL 4 MG PO TABS: 4.0000 mg | ORAL_TABLET | Freq: Four times a day (QID) | ORAL | Status: DC | PRN

## 2017-07-21 MED ORDER — GABAPENTIN 400 MG PO CAPS
1200.0000 mg | ORAL_CAPSULE | Freq: Three times a day (TID) | ORAL | Status: DC
  Administered 2017-07-21 – 2017-07-23 (×7): 1200 mg via ORAL
  Filled 2017-07-21 (×7): qty 3

## 2017-07-21 MED ORDER — ASPIRIN EC 325 MG PO TBEC
325.0000 mg | DELAYED_RELEASE_TABLET | Freq: Every day | ORAL | Status: DC
  Administered 2017-07-21: 325 mg via ORAL
  Filled 2017-07-21: qty 1

## 2017-07-21 MED ORDER — ACETAMINOPHEN 650 MG RE SUPP: 650.0000 mg | Freq: Four times a day (QID) | RECTAL | Status: DC | PRN

## 2017-07-21 MED ORDER — NICOTINE POLACRILEX 2 MG MT GUM: 2.0000 mg | CHEWING_GUM | OROMUCOSAL | Status: DC | PRN

## 2017-07-21 MED ORDER — GADOBENATE DIMEGLUMINE 529 MG/ML IV SOLN
18.0000 mL | Freq: Once | INTRAVENOUS | Status: AC | PRN
  Administered 2017-07-21: 18 mL via INTRAVENOUS

## 2017-07-21 MED ORDER — DEXTROSE 50 % IV SOLN
INTRAVENOUS | Status: AC
  Filled 2017-07-21: qty 50

## 2017-07-21 MED ORDER — ONDANSETRON HCL 4 MG/2ML IJ SOLN
4.0000 mg | Freq: Four times a day (QID) | INTRAMUSCULAR | Status: DC | PRN
Start: 2017-07-21 — End: 2017-07-23

## 2017-07-21 MED ORDER — DEXTROSE 50 % IV SOLN
1.0000 | Freq: Once | INTRAVENOUS | Status: AC
  Administered 2017-07-21: 50 mL via INTRAVENOUS

## 2017-07-21 MED ORDER — SODIUM CHLORIDE 0.45 % IV SOLN
INTRAVENOUS | Status: DC
  Administered 2017-07-21: 09:00:00 via INTRAVENOUS

## 2017-07-21 MED ORDER — HEPARIN SODIUM (PORCINE) 5000 UNIT/ML IJ SOLN
5000.0000 [IU] | Freq: Three times a day (TID) | INTRAMUSCULAR | Status: DC
  Administered 2017-07-21 – 2017-07-23 (×7): 5000 [IU] via SUBCUTANEOUS
  Filled 2017-07-21 (×7): qty 1

## 2017-07-21 MED ORDER — CLONAZEPAM 0.5 MG PO TABS
0.5000 mg | ORAL_TABLET | Freq: Three times a day (TID) | ORAL | Status: DC
  Administered 2017-07-21 – 2017-07-23 (×6): 0.5 mg via ORAL
  Filled 2017-07-21 (×6): qty 1

## 2017-07-21 MED ORDER — INSULIN ASPART 100 UNIT/ML ~~LOC~~ SOLN
0.0000 [IU] | Freq: Three times a day (TID) | SUBCUTANEOUS | Status: DC
  Administered 2017-07-21: 2 [IU] via SUBCUTANEOUS
  Administered 2017-07-21: 1 [IU] via SUBCUTANEOUS
  Administered 2017-07-22 (×2): 2 [IU] via SUBCUTANEOUS
  Administered 2017-07-23 (×2): 1 [IU] via SUBCUTANEOUS
  Filled 2017-07-21: qty 1

## 2017-07-21 NOTE — ED Notes (Signed)
Dr. Merrell at bedside. 

## 2017-07-21 NOTE — H&P (Signed)
History and Physical    Tara Hopkins DVV:[id number] DOB: 19-Apr-1951 DOA: 07/20/2017  PCP: [name], NP Patient coming from: [facility]  Chief Complaint: R sided weakness  HPI: Tara Hopkins is a 66 y.o. female with medical history significant of anemia, anxiety/Bipolar, DM, glaucoma, HTN, HLD, peripheral neuropathy, CVA w/ L sided paralysis.    Pt reports awaking on 07/20/17 with speech difficulty predominantly described as slurring of speech. Patient didn't do anything for her symptoms prevent cannot around the middle of the day. When patient awoke at approximately 15:00 the L side of her body had become weak. Patient did not come to the emergency room at the time because she thought that her symptoms would get better on their own. Symptoms persisted she finally decided to come to the emergency room. Patient denies any other associated symptoms such as headache, dizziness, chest pain, palpitations, fevers, cough, dysuria or frequency, abdominal pain.   Currently pt states that weakness is slowly improving   ED Course: stroke workup initiated. Neuro consulted. Objective findings outlined below.   Review of Systems: As per HPI otherwise all other systems reviewed and are negative  Ambulatory Status: typically no restrictions  Past Medical History:  Diagnosis Date  . Anemia   . Anxiety   . Bipolar 1 disorder ([place])   . Cataracts, bilateral   . Depression   . Diabetes mellitus without complication ([place])   . Glaucoma   . History of blood transfusion   . History of hyperbaric oxygen therapy    pt reports 80 treatments.  . Hypercholesteremia   . Hypertension   . Peripheral neuropathy   . Stroke [facility]) 02/2015    Past Surgical History:  Procedure Laterality Date  . AMPUTATION Right 12/15/2013   Procedure: Right Transmetatarsal Amputation;  Surgeon: Newt Minion, MD;  Location: [place];  Service: Orthopedics;  Laterality: Right;  . AMPUTATION Right 02/23/2014   Procedure:  AMPUTATION FOOT;  Surgeon: Newt Minion, MD;  Location: [place];  Service: Orthopedics;  Laterality: Right;  Right Foot Revision Transmetatarsal Amputation  . I&D EXTREMITY Right 05/23/2013   Procedure: IRRIGATION AND DEBRIDEMENT RIGHT GREAT TOE;  Surgeon: Mauri Pole, MD;  [facility];  Service: Orthopedics;  Laterality: Right;    Social History   Social History  . Marital status: Married    Spouse name: N/A  . Number of children: 3  . Years of education: 14   Occupational History  . Disbaled    Social History Main Topics  . Smoking status: Current Every Day Smoker    Packs/day: 0.01    Years: 20.00    Types: Cigarettes  . Smokeless tobacco: Never Used  . Alcohol use 0.0 oz/week     Comment: beer every 3 months  . Drug use: No  . Sexual activity: Not on file   Other Topics Concern  . Not on file   Social History Narrative   Lives at home alone.   Right-handed.   No caffeine use.   Oldest of 12 children        No Known Allergies  Family History  Problem Relation Age of Onset  . Liver cancer Mother   . Lung cancer Mother   . Depression Mother   . Hypertension Father   . Depression Father   . Colon cancer Neg Hx       Prior to Admission medications   Medication Sig Start Date End Date Taking? Authorizing Provider  clonazePAM (KLONOPIN) 0.5 MG tablet  take 1 tablet by mouth three times a day 06/29/17  Yes Nafziger, Tommi Rumps, NP  gabapentin (NEURONTIN) 400 MG capsule Take 3 capsules (1,200 mg total) by mouth 3 (three) times daily. 06/29/17 07/29/17 Yes Nafziger, Tommi Rumps, NP  glimepiride (AMARYL) 4 MG tablet take 1 tablet once daily 12/01/16  Yes Nafziger, Tommi Rumps, NP  Insulin Glargine (BASAGLAR KWIKPEN) 100 UNIT/ML SOPN Inject 0.38 mLs (38 Units total) into the skin at bedtime. 07/07/17  Yes Nafziger, Tommi Rumps, NP  lisinopril (PRINIVIL,ZESTRIL) 20 MG tablet Take 1 tablet (20 mg total) by mouth daily. 11/12/15  Yes Nafziger, Tommi Rumps, NP  simvastatin (ZOCOR) 40 MG tablet Take 1 tablet  (40 mg total) by mouth at bedtime. 05/04/17  Yes Nafziger, Tommi Rumps, NP  aspirin EC 325 MG EC tablet Take 1 tablet (325 mg total) by mouth daily. 03/08/15   Love, Ivan Anchors, PA-C  COD LIVER OIL PO Take 1 capsule by mouth daily.    [provider]  glucose blood test strip Test blood sugars 3 times daily Dx: E11.42 03/26/16   Nafziger, Tommi Rumps, NP  Insulin Pen Needle (B-D UF III MINI PEN NEEDLES) 31G X 5 MM MISC Use as directed. 03/01/17   Nafziger, Tommi Rumps, NP  lamoTRIgine (LAMICTAL) 100 MG tablet take 1/2 tablet by mouth twice a day Patient not taking: Reported on 07/21/2017 12/01/16   [name], NP  ONE TOUCH LANCETS MISC USE TO CHECK BLOOD SUGAR 3 TIMES DAILY 03/26/16   [name], NP    Physical Exam: Vitals:   07/21/17 0645 07/21/17 0700 07/21/17 0715 07/21/17 0730  BP: (!) 146/87 130/79 (!) 168/69 (!) 147/78  Pulse: (!) 56 (!) 57 (!) 59 (!) 54  Resp: 11 11 12 14   Temp:      TempSrc:      SpO2: 98% 96% 97% 100%  Weight:      Height:         General:  Appears calm and comfortable Eyes:  PERRL, EOMI, normal lids, iris ENT:  grossly normal hearing, lips & tongue, mmm Neck:  no LAD, masses or thyromegaly Cardiovascular:  RRR, no m/r/g. No LE edema.  Respiratory:  CTA bilaterally, no w/r/r. Normal respiratory effort. Abdomen:  soft, ntnd, NABS Skin:  no rash or induration seen on limited exam Musculoskeletal:  good ROM, no bony abnormality Psychiatric:  grossly normal mood and affect, speech fluent and appropriate, AOx3 Neurologic:  CN 2-12 grossly intact, moves all extremities in coordinated fashion, sensation intact, 4/5 grip strnength L and 5/5 grip strength R, slurred speech  Labs on Admission: I have personally reviewed following labs and imaging studies  CBC:  Recent Labs Lab 07/20/17 2355  WBC 10.2  NEUTROABS 6.1  HGB 11.1*  HCT 35.4*  MCV 88.1  PLT 250   Basic Metabolic Panel:  Recent Labs Lab 07/20/17 2355  NA 142  K 3.7  CL 110  CO2 24  GLUCOSE  110*  BUN 18  CREATININE 1.46*  CALCIUM 9.1   GFR: Estimated Creatinine Clearance: 40.6 mL/min (A) (by C-G formula based on SCr of 1.46 mg/dL (H)). Liver Function Tests:  Recent Labs Lab 07/20/17 2355  AST 19  ALT 15  ALKPHOS 67  BILITOT 0.4  PROT 6.2*  ALBUMIN 3.1*   No results for input(s): LIPASE, AMYLASE in the last 168 hours. No results for input(s): AMMONIA in the last 168 hours. Coagulation Profile:  Recent Labs Lab 07/21/17 0049  INR 1.02   Cardiac Enzymes: No results for input(s): CKTOTAL, CKMB, CKMBINDEX,  TROPONINI in the last 168 hours. BNP (last 3 results) No results for input(s): PROBNP in the last 8760 hours. HbA1C: No results for input(s): HGBA1C in the last 72 hours. CBG: No results for input(s): GLUCAP in the last 168 hours. Lipid Profile: No results for input(s): CHOL, HDL, LDLCALC, TRIG, CHOLHDL, LDLDIRECT in the last 72 hours. Thyroid Function Tests: No results for input(s): TSH, T4TOTAL, FREET4, T3FREE, THYROIDAB in the last 72 hours. Anemia Panel: No results for input(s): VITAMINB12, FOLATE, FERRITIN, TIBC, IRON, RETICCTPCT in the last 72 hours. Urine analysis:    Component Value Date/Time   COLORURINE YELLOW 07/21/2017 [place] 07/21/2017 0347   LABSPEC 1.017 07/21/2017 0347   PHURINE 5.0 07/21/2017 0347   GLUCOSEU NEGATIVE 07/21/2017 0347   HGBUR NEGATIVE 07/21/2017 0347   BILIRUBINUR NEGATIVE 07/21/2017 0347   KETONESUR NEGATIVE 07/21/2017 0347   PROTEINUR >=300 (A) 07/21/2017 0347   UROBILINOGEN 0.2 07/17/2015 1016   NITRITE NEGATIVE 07/21/2017 0347   LEUKOCYTESUR NEGATIVE 07/21/2017 0347    Creatinine Clearance: Estimated Creatinine Clearance: 40.6 mL/min (A) (by C-G formula based on SCr of 1.46 mg/dL (H)).  Sepsis Labs: @LABRCNTIP (procalcitonin:4,lacticidven:4) )No results found for this or any previous visit (from the past 240 hour(s)).   Radiological Exams on Admission: Ct Head Wo Contrast  Result  Date: 07/20/2017 CLINICAL DATA:  Focal neurologic deficit for greater than 6 hours, stroke suspected. Left-sided weakness. EXAM: CT HEAD WITHOUT CONTRAST TECHNIQUE: Contiguous axial images were obtained from the base of the skull through the vertex without intravenous contrast. COMPARISON:  Head CT 07/17/2015 FINDINGS: Brain: No acute hemorrhage. No evidence of acute territorial ischemia. Generalized atrophy and chronic small vessel ischemic changes, stable from prior. Remote lacunar infarct in the periventricular white matter, unchanged. No hydrocephalus, midline shift or mass effect. Vascular: Atherosclerosis of skullbase vasculature without hyperdense vessel or abnormal calcification. Skull: No skull fracture or focal lesion. Sinuses/Orbits: Frontal sinuses and left mastoid air cells are hypoplastic. No acute finding. No inflammatory change. Bilateral cataract resection. Other: None. IMPRESSION: 1. No acute intracranial abnormality. No evidence of acute ischemia on noncontrast head CT. 2. Stable atrophy, chronic small vessel ischemia and remote lacunar infarct. Electronically Signed   By: Jeb Levering M.D.   On: 07/20/2017 23:58   Mr Brain Wo Contrast  Result Date: 07/21/2017 CLINICAL DATA:  66 y/o F; increase left extremity weakness and slurred speech. History of stroke with left-sided deficits. EXAM: MRI HEAD WITHOUT CONTRAST TECHNIQUE: Multiplanar, multiecho pulse sequences of the brain and surrounding structures were obtained without intravenous contrast. COMPARISON:  07/20/2017 CT of the head FINDINGS: Brain: Subcentimeter focus of reduced diffusion in the right posterior limb of internal capsule. Mild associated T2 hyperintensity. No acute hemorrhage. Moderate chronic microvascular ischemic changes and mild brain parenchymal volume loss. Chronic lacunar infarcts within the right paramedian pons, bilateral thalamus, left mid corona radiata, and right posterior corona radiata. No abnormal  susceptibility hypointensity to indicate intracranial hemorrhage. No hydrocephalus or effacement of basilar cisterns. No focal mass effect. Vascular:  Normal flow voids. Skull and upper cervical spine: Normal marrow signal. Sinuses/Orbits: Negative. Other: None. IMPRESSION: 1. Subcentimeter acute/early subacute infarction in right posterior limb of internal capsule. No acute hemorrhage. 2. Moderate chronic microvascular ischemic changes and moderate parenchymal volume loss of the brain. Chronic lacunar infarcts of basal ganglia and pons. These results will be called to the ordering clinician or representative by the Radiologist Assistant, and communication documented in the PACS or zVision Dashboard. Electronically Signed  By: Kristine Garbe M.D.   On: 07/21/2017 04:59    EKG: Independently reviewed. Nonspecific T wave changes. Sinus.   Assessment/Plan Active Problems:   DM type 2 with diabetic peripheral neuropathy (HCC)   Essential hypertension   Status post transmetatarsal amputation of right foot (HCC)   CVA (cerebral vascular accident) ([place])   Ischemic stroke ([place])   Diabetes mellitus with complication ([place])   Slurred speech   CVA: New ischemic stroke as noted on MRI as above. Deficits slowly improving per patient. Neuro following. This will be patient's second stroke - MRA head/neck - Echo - ASA,  - Change from simvastatin to Lipitor - Permissive HTN - Neuro checks - A1c and lipid panel - PT/OT  HTN: allow permissive HTN - Hold lisinopril - Hydralazine PRN  CKD: Cr 1.46. At baseline - BMP in am  DM: - SSI  Bipolar/Anxiety: currently stable and off lamictal - continue klonopin  Chronic pain: - cnotinue neurontin  Polysubstance abuse: UDS + for THC. Pt endorses occasional cocaine use but nothing for several days.   Tobacco use: ~2 cigarettes per day - Nicotine PRN   DVT prophylaxis: heparin  Code Status: full  Family Communication: none  Disposition  Plan: pending workup and resolution of sx or transfer to [place] called: neuro  Admission status: inpt    Azaleah Usman J MD [facility]  If 7PM-7AM, please contact night-coverage [email or web address] Password [facility]  07/21/2017, 7:57 AM

## 2017-07-21 NOTE — Progress Notes (Signed)
Pt left unit for Echo test.

## 2017-07-21 NOTE — Progress Notes (Signed)
Rehab Admissions Coordinator Note:  Patient was screened by Cleatrice Burke for appropriateness for an Inpatient Acute Rehab Consult per PT recommendation.   At this time, we are recommending Inpatient Rehab consult. Please place order for consult.  Cleatrice Burke 07/21/2017, 7:32 PM  I can be reached at [phone].

## 2017-07-21 NOTE — ED Notes (Signed)
Pt CBG was 206, notified [place](RN)

## 2017-07-21 NOTE — ED Notes (Addendum)
This RN attempted to do swallow screen on pt. Pt not alert and snoring at this time even after stimulation. Pt oriented x4 when aroused. Unable to place pt on bed pan to obtain urine sample. Pt snoring at this time, respirations even & unlabored.

## 2017-07-21 NOTE — ED Notes (Signed)
Patient transported to MRI 

## 2017-07-21 NOTE — ED Notes (Signed)
Neurologist at bedside. 

## 2017-07-21 NOTE — Plan of Care (Signed)
[name] is a 66 year old female with pmh of HTN, HLD, DM, polysub abs, CVA with residual left-sided hemiparesis in 02/2015, CKD stage 3; who presents with worsening left-sided weakness. Last seen normal over 24 hours ago.  VS: blood pressure elevated up to 169/84, and all other vital signs relatively within normal limits.   Labs reveal WBC 10.8, hemoglobin 11.1, BUN 18, creatinine 1.46, glucose 114, INR 1.02, troponin 0.02. Urinalysis was negative for any signs of infection. CT scan of the brain showed no acute abnormalities. UDS positive for marijuana.   As patient was seen initially as a code stroke by neurology MRI was recommended which revealed signs of a acute/subacute stroke in the right posterior limb of the internal capsule. Neurology recommending Tara Hopkins admission for completion of stroke workup.

## 2017-07-21 NOTE — ED Notes (Signed)
Report attempted x 1

## 2017-07-21 NOTE — ED Notes (Signed)
Breakfast tray ordered 

## 2017-07-21 NOTE — ED Notes (Signed)
Dr. Marily Memos paged for diet order after pt passed swallow screen

## 2017-07-21 NOTE — ED Notes (Signed)
Pt CBG was 170, notified Michael(RN)

## 2017-07-21 NOTE — Progress Notes (Signed)
STROKE TEAM PROGRESS NOTE   SUBJECTIVE (INTERVAL HISTORY) No family at the bedside.  The patient is awake, alert, and follows all commands appropriately.  She admitted that she relapsed on cocaine 4 weeks ago, and continue use marijuana. She is willing to quit goes.   OBJECTIVE Temp:  [98 F (36.7 C)-98.5 F (36.9 C)] 98.5 F (36.9 C) (09/12 1439) Pulse Rate:  [47-77] 56 (09/12 1439) Resp:  [10-19] 12 (09/12 1439) BP: (110-186)/(59-90) 140/59 (09/12 1439) SpO2:  [94 %-100 %] 100 % (09/12 1439) Weight:  [82.1 kg (181 lb)-84.1 kg (185 lb 6.5 oz)] 84.1 kg (185 lb 6.5 oz) (09/12 1439)  CBC:   Recent Labs Lab 07/20/17 2355  WBC 10.2  NEUTROABS 6.1  HGB 11.1*  HCT 35.4*  MCV 88.1  PLT 325    Basic Metabolic Panel:   Recent Labs Lab 07/20/17 2355  NA 142  K 3.7  CL 110  CO2 24  GLUCOSE 110*  BUN 18  CREATININE 1.46*  CALCIUM 9.1    Lipid Panel:     Component Value Date/Time   CHOL 169 06/10/2017 0841   TRIG 223.0 (H) 06/10/2017 0841   HDL 50.00 06/10/2017 0841   CHOLHDL 3 06/10/2017 0841   VLDL 44.6 (H) 06/10/2017 0841   LDLCALC 88 02/16/2015 0300   HgbA1c:  Lab Results  Component Value Date   HGBA1C 9.2 (H) 07/21/2017   Urine Drug Screen:     Component Value Date/Time   LABOPIA NONE DETECTED 07/21/2017 0347   COCAINSCRNUR NONE DETECTED 07/21/2017 0347   LABBENZ NONE DETECTED 07/21/2017 0347   AMPHETMU NONE DETECTED 07/21/2017 0347   THCU POSITIVE (A) 07/21/2017 0347   LABBARB NONE DETECTED 07/21/2017 0347    Alcohol Level     Component Value Date/Time   ETH <5 07/20/2017 2355    IMAGING I have personally reviewed the radiological images below and agree with the radiology interpretations.  Ct Head Wo Contrast 07/20/2017  IMPRESSION: 1. No acute intracranial abnormality. No evidence of acute ischemia on noncontrast head CT. 2. Stable atrophy, chronic small vessel ischemia and remote lacunar infarct.  Mr [facility] Contrast Mr Jodene Nam Neck W Wo  Contrast 07/21/2017 IMPRESSION: Intracranial MRA: 1. No acute finding. 2. Intracranial atherosclerosis most significantly affecting the left PCA where there are high-grade P2 branch stenoses. 3. Moderate right proximal M1 segment stenosis. Neck MRA: 1. ~40% left ICA origin stenosis. 2. At least 60% proximal left subclavian stenosis. 3. Bilateral high-grade vertebral origins stenoses with flow gaps. 4. Goiter.  Mr Brain Wo Contrast 07/21/2017 IMPRESSION: 1. Subcentimeter acute/early subacute infarction in right posterior limb of internal capsule. No acute hemorrhage. 2. Moderate chronic microvascular ischemic changes and moderate parenchymal volume loss of the brain. Chronic lacunar infarcts of basal ganglia and pons.    TTE 07/21/2017  Study Conclusions - Left ventricle: The cavity size was normal. There was moderate focal basal hypertrophy of the septum. Systolic function was normal. The estimated ejection fraction was in the range of 55% to 60%. Wall motion was normal; there were no regional wall motion abnormalities. Features are consistent with a pseudonormal left ventricular filling pattern, with concomitant abnormal relaxation and increased filling pressure (grade 2 diastolic dysfunction). - Mitral valve: There was mild regurgitation. - Tricuspid valve: There was mild regurgitation. - Pulmonary arteries: Systolic pressure was mildly increased. PA peak pressure: 34 mm Hg (S). Impressions: - No cardiac source of emboli was indentified. Compared to the   prior study, there has been  no significant interval change.   PHYSICAL EXAM  Temp:  [97.6 F (36.4 C)-98.8 F (37.1 C)] 98.8 F (37.1 C) (09/13 1736) Pulse Rate:  [54-67] 59 (09/13 1736) Resp:  [16-18] 18 (09/13 1736) BP: (126-188)/(56-86) 188/56 (09/13 1736) SpO2:  [94 %-98 %] 96 % (09/13 1736)  General - Well nourished, well developed, in no apparent distress.  Ophthalmologic - Sharp disc margins OU.   Cardiovascular - Regular  rate and rhythm.  Mental Status -  Level of arousal and orientation to time, place, and person were intact. Language including expression, naming, repetition, comprehension was assessed and found intact. Mild dysarthria Fund of Knowledge was assessed and was intact.  Cranial Nerves II - XII - II - Visual field intact OU. III, IV, VI - Extraocular movements intact. V - Facial sensation intact bilaterally. VII - Facial movement intact bilaterally. VIII - Hearing & vestibular intact bilaterally. X - Palate elevates symmetrically, mild dysarthria. XI - Chin turning & shoulder shrug intact bilaterally. XII - Tongue protrusion intact.  Motor Strength - The patient's strength was normal in RUE and RLE, however left upper extremity 4/5 and left lower extremity 4+/5.  Bulk was normal and fasciculations were absent.   Motor Tone - Muscle tone was assessed at the neck and appendages and was normal.  Reflexes - The patient's reflexes were 1+ in all extremities and she had no pathological reflexes.  Sensory - Light touch, temperature/pinprick were assessed and were symmetrical.    Coordination - The patient had normal movements in the hands with no ataxia or dysmetria.  Tremor was absent.  Gait and Station - deferred   ASSESSMENT/PLAN Ms. Tara xxxChestnut is a 66 y.o. female with history of PMH of DM, hypertension, hyperlipidemia, substance abuse, amputation left foot and right lacunar IC infarct in 2016. She did not receive IV t-PA due to arriving outside of the tPA treatment window.   Stroke: right PLIC infarcts, likely small vessel disease  Resultant  left mild hemiparesis  CT head: no acute stroke  MRI head: Subcentimeter acute/early subacute infarction in right posterior limb of internal capsule  MRA head and neck: Right M1 and left P2 stenosis, 60% proximal L subclavian stenosis, bilateral high-grade VA origin stenoses with flow gaps, left ICA 40% stenosis.  2D Echo: LVH and  grade 2 DD.  EF 55-60%. No source of embolus  UDS positive for THC  LDL 88  HgbA1c 9.2  Heparin 5000 units TID for VTE prophylaxis Diet regular Room service appropriate? Yes; Fluid consistency: Thin  aspirin 325 mg daily prior to admission, now on aspirin 325 mg daily. Continue aspirin on discharge.  Patient counseled to be compliant with her antithrombotic medications  Ongoing aggressive stroke risk factor management  Therapy recommendations: CIR  Disposition: pending  History of stroke  02/2015 - right BG infarct s/p TPA - MRI showed intracranial atherosclerosis, CTA neck showed left 50% stenosis ICA, carotid Doppler left ICA 40-59% stenosis and 2-D echo negative, and DVT negative - LDL 88, A1c 8.9 - UDS positive for cocaine - discharged on aspirin and Zocor  Cocaine abuse  02/2015 UDS positive for cocaine  Admitted relapse on cocaine 4 weeks ago  Cessation counseling provided  Patient willing to quit  Marijuana abuse  UDS positive for marijuana  Admitted to continue the use  Cessation counseling provided  Patient willing to quit  Tobacco abuse  Current smoker  Smoking cessation counseling provided  Nicotine patch provided  Pt is willing to quit  Hypertension  Stable  Permissive hypertension (OK if < 220/120) but gradually normalize in 5-7 days  Long-term BP goal normotensive  Hyperlipidemia  Home meds: simvastatin 40 mg PO daily  LDL 88, goal < 70  Change simvastatin to atorvastatin 40 mg PO daily  Continue statin at discharge  Diabetes  HgbA1c 9.2, goal < 7.0  Uncontrolled  Diabetes Coordinator consulted  On Lantus  SSI  CBG monitoring  Other Stroke Risk Factors  Advanced age  ETOH use, advised to drink no more than 1 drink(s) a day  Obesity, Body mass index is 30.85 kg/m., recommend weight loss, diet and exercise as appropriate   Other Active Problems  CKD, Stage 3A, creatinine 1.39  Hospital day # 0  Neurology  will sign off. Please call with questions. Pt will follow up with Cecille Rubin NP at [facility] in about 6 weeks. Thanks for the consult.  Rosalin Hawking, MD PhD Stroke Neurology 07/22/2017 5:53 PM   To contact Stroke Continuity provider, please refer to [email or web address] After hours, contact General Neurology

## 2017-07-21 NOTE — Consult Note (Signed)
Requesting Physician: Dr. Joseph Berkshire    Chief Complaint:  Worsening left side weakness  History obtained from: Patient and Chart   HPI:                                                                                                                                       Tara Hopkins is an 66 y.o. female African-American right-handed with PMH of DM, hypertension, hyperlipidemia, substance abuse,, amputation left foot and  right lacunar IC infarct in 2016 presents with slurred speech and left-sided weakness. Patient is a poor historian and told me that the reason she came to the hospital was because she was feeling bad. She woke up this morning with slurred speech and then went and took a nap. Upon waking up she noticed her left side was much weaker and she called the ambulance. An MRI head was obtained to distinguish between recrudescence of old stroke symptoms versus new stroke. MRI shows an acute infarct in the posterior limb of the internal capsule. Patient admitted to using cocaine about 3-4 weeks ago and Utox was positive for cannabinoids.  Date last known well: 9.10.18 Time last known well: Around 54 PM tPA Given: No, outside window NIHSS 4        Modified Rankin: 1    Past Medical History:  Diagnosis Date  . Anemia   . Anxiety   . Bipolar 1 disorder ([place])   . Cataracts, bilateral   . Depression   . Diabetes mellitus without complication ([place])   . Glaucoma   . History of blood transfusion   . History of hyperbaric oxygen therapy    pt reports 80 treatments.  . Hypercholesteremia   . Hypertension   . Peripheral neuropathy   . Stroke [facility]) 02/2015    Past Surgical History:  Procedure Laterality Date  . AMPUTATION Right 12/15/2013   Procedure: Right Transmetatarsal Amputation;  Surgeon: Newt Minion, MD;  Location: [place];  Service: Orthopedics;  Laterality: Right;  . AMPUTATION Right 02/23/2014   Procedure: AMPUTATION FOOT;  Surgeon: Newt Minion, MD;   Location: [place];  Service: Orthopedics;  Laterality: Right;  Right Foot Revision Transmetatarsal Amputation  . I&D EXTREMITY Right 05/23/2013   Procedure: IRRIGATION AND DEBRIDEMENT RIGHT GREAT TOE;  Surgeon: Mauri Pole, MD;  Location: [facility];  Service: Orthopedics;  Laterality: Right;    Family History  Problem Relation Age of Onset  . Liver cancer Mother   . Lung cancer Mother   . Depression Mother   . Hypertension Father   . Depression Father   . Colon cancer Neg Hx    Social History:  reports that she has been smoking Cigarettes.  She has a 0.20 pack-year smoking history. She has never used smokeless tobacco. She reports that she drinks alcohol. She reports that she does not use drugs.  Allergies: No Known Allergies  Medications:  reviewed ROS:                                                                                                                          14 systems reviewed and negative   Examination:                                                                                                      General: Appears well-developed and well-nourished.  Psych: Affect appropriate to situation Eyes: No scleral injection HENT: No OP obstrucion Head: Normocephalic.  Cardiovascular: Normal rate and regular rhythm.  Respiratory: Effort normal and breath sounds normal to anterior ascultation GI: Soft.  No distension. There is no tenderness.  Skin: WDI Extremity: Left foot amputated  Neurological Examination Mental Status: Alert, oriented, thought content appropriate.  Speech fluent without evidence of aphasia. Dysarthric. Able to follow 3 step commands without difficulty. Cranial Nerves: II:  Visual fields grossly normal,  III,IV, VI: ptosis not present, extra-ocular motions intact bilaterally, pupils equal, round, reactive to light and  accommodation V,VII: Left facial droop VIII: hearing normal bilaterally IX,X: uvula rises symmetrically XI: bilateral shoulder shrug XII: midline tongue extension Motor: Right : Upper extremity   5/5    Left:    Upper extremity   3+/5  Lower extremity   5/5     Lower extremity   4/5 Tone and bulk:normal tone throughout; no atrophy noted Sensory: Pinprick and light touch intact throughout, bilaterally Deep Tendon Reflexes: 2+ and symmetric throughout Plantars: Right: downgoing   Left: downgoing Cerebellar: normal finger-to-nose, normal rapid alternating movements and normal heel-to-shin test Gait: normal gait and station     Lab Results: Basic Metabolic Panel:  Recent Labs Lab 07/20/17 2355  NA 142  K 3.7  CL 110  CO2 24  GLUCOSE 110*  BUN 18  CREATININE 1.46*  CALCIUM 9.1    CBC:  Recent Labs Lab 07/20/17 2355  WBC 10.2  NEUTROABS 6.1  HGB 11.1*  HCT 35.4*  MCV 88.1  PLT 314    Coagulation Studies:  Recent Labs  07/21/17 0049  LABPROT 13.3  INR 1.02    Imaging: Ct Head Wo Contrast  Result Date: 07/20/2017 CLINICAL DATA:  Focal neurologic deficit for greater than 6 hours, stroke suspected. Left-sided weakness. EXAM: CT HEAD WITHOUT CONTRAST TECHNIQUE: Contiguous axial images were obtained from the base of the skull through the vertex without intravenous contrast. COMPARISON:  Head CT 07/17/2015 FINDINGS: Brain: No acute hemorrhage. No evidence of acute territorial ischemia. Generalized atrophy and chronic small vessel ischemic changes, stable  from prior. Remote lacunar infarct in the periventricular white matter, unchanged. No hydrocephalus, midline shift or mass effect. Vascular: Atherosclerosis of skullbase vasculature without hyperdense vessel or abnormal calcification. Skull: No skull fracture or focal lesion. Sinuses/Orbits: Frontal sinuses and left mastoid air cells are hypoplastic. No acute finding. No inflammatory change. Bilateral cataract  resection. Other: None. IMPRESSION: 1. No acute intracranial abnormality. No evidence of acute ischemia on noncontrast head CT. 2. Stable atrophy, chronic small vessel ischemia and remote lacunar infarct. Electronically Signed   By: Jeb Levering M.D.   On: 07/20/2017 23:58   Mr Brain Wo Contrast  Result Date: 07/21/2017 CLINICAL DATA:  66 y/o F; increase left extremity weakness and slurred speech. History of stroke with left-sided deficits. EXAM: MRI HEAD WITHOUT CONTRAST TECHNIQUE: Multiplanar, multiecho pulse sequences of the brain and surrounding structures were obtained without intravenous contrast. COMPARISON:  07/20/2017 CT of the head FINDINGS: Brain: Subcentimeter focus of reduced diffusion in the right posterior limb of internal capsule. Mild associated T2 hyperintensity. No acute hemorrhage. Moderate chronic microvascular ischemic changes and mild brain parenchymal volume loss. Chronic lacunar infarcts within the right paramedian pons, bilateral thalamus, left mid corona radiata, and right posterior corona radiata. No abnormal susceptibility hypointensity to indicate intracranial hemorrhage. No hydrocephalus or effacement of basilar cisterns. No focal mass effect. Vascular:  Normal flow voids. Skull and upper cervical spine: Normal marrow signal. Sinuses/Orbits: Negative. Other: None. IMPRESSION: 1. Subcentimeter acute/early subacute infarction in right posterior limb of internal capsule. No acute hemorrhage. 2. Moderate chronic microvascular ischemic changes and moderate parenchymal volume loss of the brain. Chronic lacunar infarcts of basal ganglia and pons. These results will be called to the ordering clinician or representative by the Radiologist Assistant, and communication documented in the PACS or zVision Dashboard. Electronically Signed   By: Kristine Garbe M.D.   On: 07/21/2017 04:59     ASSESSMENT AND PLAN   66 y.o. female African-American right-handed with PMH of DM,  hypertension, hyperlipidemia, substance abuse, CVA with residual left weakness presents with worsening left UE and LE weakness and slurred speech. MRI shows acute infarct in posterior limb of internal capsule.  Acute Ischemic Stroke   Risk factors: cocaine, HTN, DM, HLD Etiology: lacunar  Recommend #MRA Head and neck  #Transthoracic Echo  # Continue ASA 325mg , may consider switching to Plavix #Start or continue Atorvastatin 40 mg/other high intensity statin # BP goal: permissive HTN upto 245 systolic, PRNs above 21 # HBAIC and Lipid profile # Telemetry monitoring # Frequent neuro checks # NPO until passes stroke swallow screen  Please page stroke NP  Or  PA  Or MD from 8am -4 pm  as this patient from this time will be  followed by the stroke.   You can look them up on [email or web address]  Password [email or web address]   I spent a total of 50 minutes the treatment and diagnosis of this patient was neurologically critically ill as well as counseling the patient regarding severity of her condition and the absolute need to abstain from cocaine.

## 2017-07-21 NOTE — Progress Notes (Signed)
Pt received at 14:30 pm from ED drowsy. Pt stable, neuro intact. No noted distress. Telemetry monitoring. Pt oriented to room. Safety measures in place. Call bell within reach. Will continue to monitor.

## 2017-07-21 NOTE — Evaluation (Signed)
Physical Therapy Evaluation Patient Details Name: Tara Hopkins MRN: [id number] DOB: October 04, 1951 Today's Date: 07/21/2017   History of Present Illness  Tara Hopkins is a 66 y.o. female with medical history significant of anemia, anxiety/Bipolar, DM, glaucoma, HTN, HLD, peripheral neuropathy, CVA w/ L sided paralysis, admitted with L sided weakness.  MRI showed Subcentimeter acute/early subacute infarction in right posterior internal capsule.  Clinical Impression  Pt admitted with/for L sided weakness.  MRI showed R post Inter capsule infarct.  Pt needing min assist for mobility at least.  Pt currently limited functionally due to the problems listed. ( See problems list.)   Pt will benefit from PT to maximize function and safety in order to get ready for next venue listed below.'     Follow Up Recommendations CIR    Equipment Recommendations  None recommended by PT (TBA)    Recommendations for Other Services Rehab consult     Precautions / Restrictions Precautions Precautions: Fall Restrictions Weight Bearing Restrictions: No      Mobility  Bed Mobility Overal bed mobility: Needs Assistance Bed Mobility: Rolling;Sidelying to Sit;Sit to Sidelying Rolling: Min guard Sidelying to sit: Min assist     Sit to sidelying: Mod assist General bed mobility comments: pt used rail to roll R and assist coming up from R. Pt given some truncal support.  Transfers Overall transfer level: Needs assistance   Transfers: Sit to/from Stand Sit to Stand: Min assist         General transfer comment: assisted pt coming forward and for stability.  Activity limited to standing. pt with difficulty moving for feet to takes steps up toward [facility].  Ambulation/Gait             General Gait Details: Not tested, attempted sidestepping with difficulty and aborted in lieu of sitting down further up in the bed.  Stairs            Wheelchair Mobility    Modified Rankin (Stroke  Patients Only) Modified Rankin (Stroke Patients Only) Pre-Morbid Rankin Score: Moderate disability Modified Rankin: Moderately severe disability     Balance Overall balance assessment: Needs assistance Sitting-balance support: No upper extremity supported Sitting balance-Leahy Scale: Fair     Standing balance support: Single extremity supported Standing balance-Leahy Scale: Poor Standing balance comment: needed stationary object or external support                             Pertinent Vitals/Pain Pain Assessment: No/denies pain    Home Living Family/patient expects to be discharged to:: Private residence (to her god daughter's home) Living Arrangements: Other (Comment) (god daughter's help) Available Help at Discharge: Family Type of Home: House (patients) Home Access: Stairs to enter Entrance Stairs-Rails: Psychiatric nurse of Steps: 2 (pt's home) Home Layout: One level Home Equipment: Walker - 2 wheels;Bedside commode;Tub bench Additional Comments: information is for pt's home.    Prior Function Level of Independence: Independent with assistive device(s)         Comments: husband (separated) takes her to get groceries.     Hand Dominance        Extremity/Trunk Assessment   Upper Extremity Assessment Upper Extremity Assessment: Defer to OT evaluation    Lower Extremity Assessment Lower Extremity Assessment: RLE deficits/detail;LLE deficits/detail RLE Deficits / Details: generally isolated movement grossly 4/5, hip flexors 4- RLE Coordination: decreased fine motor LLE Deficits / Details: some movement in synergy, moving toward more isolated.  grossly  4- strength, hip flexors 3- LLE Coordination: decreased fine motor       Communication   Communication: No difficulties  Cognition Arousal/Alertness: Awake/alert Behavior During Therapy: WFL for tasks assessed/performed Overall Cognitive Status: Within Functional Limits for  tasks assessed                                        General Comments      Exercises     Assessment/Plan    PT Assessment Patient needs continued PT services  PT Problem List Decreased strength;Decreased activity tolerance;Decreased balance;Decreased mobility;Decreased coordination;Decreased knowledge of use of DME       PT Treatment Interventions DME instruction;Gait training;Functional mobility training;Therapeutic activities;Balance training;Patient/family education;Neuromuscular re-education    PT Goals (Current goals can be found in the Care Plan section)  Acute Rehab PT Goals Patient Stated Goal: Ultimately get back home alone PT Goal Formulation: With patient Time For Goal Achievement: 08/04/17 Potential to Achieve Goals: Good    Frequency Min 3X/week   Barriers to discharge        Co-evaluation               AM-PAC PT "6 Clicks" Daily Activity  Outcome Measure Difficulty turning over in bed (including adjusting bedclothes, sheets and blankets)?: Unable Difficulty moving from lying on back to sitting on the side of the bed? : Unable Difficulty sitting down on and standing up from a chair with arms (e.g., wheelchair, bedside commode, etc,.)?: Unable Help needed moving to and from a bed to chair (including a wheelchair)?: A Little Help needed walking in hospital room?: A Little Help needed climbing 3-5 steps with a railing? 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

## 2017-07-21 NOTE — ED Notes (Signed)
Dr. Marily Memos responded to this RNs page regarding diet order and pts blood sugar, Dr. Marily Memos gave this RN verbal order for regular diet order and 1 amp of D50.

## 2017-07-21 NOTE — ED Notes (Signed)
Breakfast tray set up at bedside

## 2017-07-21 NOTE — ED Notes (Signed)
Pt CBG was 61, notified Chelsea(RN)

## 2017-07-21 NOTE — Progress Notes (Signed)
Inpatient Diabetes Program Recommendations  AACE/ADA: New Consensus Statement on Inpatient Glycemic Control (2015)  Target Ranges:  Prepandial:   less than 140 mg/dL      Peak postprandial:   less than 180 mg/dL (1-2 hours)      Critically ill patients:  140 - 180 mg/dL   Lab Results  Component Value Date   GLUCAP 143 (H) 07/21/2017   HGBA1C 9.2 (H) 07/21/2017    Review of Glycemic Control  Diabetes history: *DM2 Outpatient Diabetes medications: Basaglar 38 units QHS, Amaryl 4 mg QD Current orders for Inpatient glycemic control: Novolog 0-9 units tidwc and hs  Inpatient Diabetes Program Recommendations:    Add Lantus 15 units QHS. Will titrate if FBS > 180 mg/dL.  Thank you. Lorenda Peck, RD, LDN, CDE Inpatient Diabetes Coordinator [phone]

## 2017-07-21 NOTE — ED Notes (Signed)
Pts CBG 53, pt given high carb snack. MD paged and made aware

## 2017-07-22 DIAGNOSIS — F1210 Cannabis abuse, uncomplicated: Secondary | ICD-10-CM

## 2017-07-22 DIAGNOSIS — G8194 Hemiplegia, unspecified affecting left nondominant side: Secondary | ICD-10-CM

## 2017-07-22 DIAGNOSIS — E785 Hyperlipidemia, unspecified: Secondary | ICD-10-CM | POA: Diagnosis present

## 2017-07-22 DIAGNOSIS — F1410 Cocaine abuse, uncomplicated: Secondary | ICD-10-CM

## 2017-07-22 DIAGNOSIS — E118 Type 2 diabetes mellitus with unspecified complications: Secondary | ICD-10-CM

## 2017-07-22 DIAGNOSIS — F17200 Nicotine dependence, unspecified, uncomplicated: Secondary | ICD-10-CM

## 2017-07-22 DIAGNOSIS — I639 Cerebral infarction, unspecified: Principal | ICD-10-CM

## 2017-07-22 LAB — BASIC METABOLIC PANEL
Anion gap: 5 (ref 5–15)
BUN: 15 mg/dL (ref 6–20)
CHLORIDE: 109 mmol/L (ref 101–111)
CO2: 27 mmol/L (ref 22–32)
Calcium: 8.8 mg/dL — ABNORMAL LOW (ref 8.9–10.3)
Creatinine, Ser: 1.39 mg/dL — ABNORMAL HIGH (ref 0.44–1.00)
GFR calc Af Amer: 45 mL/min — ABNORMAL LOW (ref >60)
GFR calc non Af Amer: 39 mL/min — ABNORMAL LOW (ref >60)
GLUCOSE: 72 mg/dL (ref 65–99)
Potassium: 3.9 mmol/L (ref 3.5–5.1)
Sodium: 141 mmol/L (ref 135–145)

## 2017-07-22 LAB — LIPID PANEL
CHOL/HDL RATIO: 4 ratio
CHOLESTEROL: 166 mg/dL (ref 0–200)
HDL: 41 mg/dL (ref >40)
LDL CALC: 87 mg/dL (ref 0–99)
Triglycerides: 191 mg/dL — ABNORMAL HIGH (ref <150)
VLDL: 38 mg/dL (ref 0–40)

## 2017-07-22 LAB — CBC
HCT: 39.1 % (ref 36.0–46.0)
HEMOGLOBIN: 12 g/dL (ref 12.0–15.0)
MCH: 27.4 pg (ref 26.0–34.0)
MCHC: 30.7 g/dL (ref 30.0–36.0)
MCV: 89.3 fL (ref 78.0–100.0)
PLATELETS: 317 10*3/uL (ref 150–400)
RBC: 4.38 MIL/uL (ref 3.87–5.11)
RDW: 15.5 % (ref 11.5–15.5)
WBC: 8.7 10*3/uL (ref 4.0–10.5)

## 2017-07-22 LAB — GLUCOSE, CAPILLARY
GLUCOSE-CAPILLARY: 73 mg/dL (ref 65–99)
GLUCOSE-CAPILLARY: 167 mg/dL — AB (ref 65–99)
Glucose-Capillary: 173 mg/dL — ABNORMAL HIGH (ref 65–99)
Glucose-Capillary: 188 mg/dL — ABNORMAL HIGH (ref 65–99)

## 2017-07-22 MED ORDER — ATORVASTATIN CALCIUM 40 MG PO TABS: 40.0000 mg | ORAL_TABLET | Freq: Every day | ORAL | 4 refills | Status: DC

## 2017-07-22 MED ORDER — SODIUM CHLORIDE 0.9 % IV SOLN
INTRAVENOUS | Status: DC
  Administered 2017-07-22 – 2017-07-23 (×2): via INTRAVENOUS

## 2017-07-22 MED ORDER — INSULIN GLARGINE 100 UNIT/ML ~~LOC~~ SOLN
15.0000 [IU] | Freq: Every day | SUBCUTANEOUS | Status: DC
  Administered 2017-07-22: 15 [IU] via SUBCUTANEOUS
  Filled 2017-07-22: qty 0.15

## 2017-07-22 MED ORDER — TRAMADOL HCL 50 MG PO TABS
50.0000 mg | ORAL_TABLET | Freq: Four times a day (QID) | ORAL | Status: DC | PRN
Start: 2017-07-22 — End: 2017-07-23
  Administered 2017-07-22 – 2017-07-23 (×2): 100 mg via ORAL
  Filled 2017-07-22 (×2): qty 2

## 2017-07-22 MED ORDER — CLOPIDOGREL BISULFATE 75 MG PO TABS
75.0000 mg | ORAL_TABLET | Freq: Every day | ORAL | Status: DC
  Administered 2017-07-22 – 2017-07-23 (×2): 75 mg via ORAL
  Filled 2017-07-22 (×2): qty 1

## 2017-07-22 MED ORDER — CLOPIDOGREL BISULFATE 75 MG PO TABS: 75.0000 mg | ORAL_TABLET | Freq: Every day | ORAL | 3 refills | Status: DC

## 2017-07-22 NOTE — Progress Notes (Signed)
Inpatient Rehabilitation  Have initiated insurance authorization.  Plan to follow up tomorrow for medical readiness, insurance authorization, and IP Rehab bed availability.  Please call with questions.   Carmelia Roller., CCC/SLP Admission Coordinator  [facility]  Cell [phone]

## 2017-07-22 NOTE — Progress Notes (Signed)
Occupational Therapy Evaluation Patient Details Name: Tara Hopkins MRN: [id number] DOB: Dec 11, 1950 Today's Date: 07/22/2017    History of Present Illness Tara Hopkins is a 66 y.o. female with medical history significant of anemia, anxiety/Bipolar, DM, glaucoma, HTN, HLD, peripheral neuropathy, CVA w/ L sided paralysis, admitted with L sided weakness.  MRI showed Subcentimeter acute/early subacute infarction in right posterior internal capsule.   Clinical Impression   PTA,ptlived alone and was modified independent with mobility and ADL using AD. Pt with functional decline requiring mod A with LB ADL and mod A with mobility at times @ RW level. Feel pt will benefit from rehab at CIR to achieve modified independent level goals to facilitate sfae return home. PT in agreement. Will follow acutely to address established goals.     Follow Up Recommendations  CIR;Supervision/Assistance - 24 hour    Equipment Recommendations  Other (comment) (TBA)    Recommendations for Other Services Rehab consult     Precautions / Restrictions Precautions Precautions: Fall Restrictions Weight Bearing Restrictions: No      Mobility Bed Mobility               General bed mobility comments: OOB on BSC  Transfers Overall transfer level: Needs assistance Equipment used: Rolling walker (2 wheeled) Transfers: Sit to/from [facility] Sit to Stand: Min assist Stand pivot transfers: Mod assist            Balance Overall balance assessment: Needs assistance Sitting-balance support: No upper extremity supported Sitting balance-Leahy Scale: Fair     Standing balance support: Single extremity supported Standing balance-Leahy Scale: Poor Standing balance comment: needed stationary object or external support                           ADL either performed or assessed with clinical judgement   ADL Overall ADL's : Needs assistance/impaired Eating/Feeding:  Modified independent   Grooming: Supervision/safety;Set up;Sitting   Upper Body Bathing: Minimal assistance;Sitting   Lower Body Bathing: Moderate assistance;Sit to/from stand   Upper Body Dressing : Minimal assistance;Sitting   Lower Body Dressing: Moderate assistance;Sit to/from stand   Toilet Transfer: Moderate assistance;Squat-pivot;BSC   Toileting- Clothing Manipulation and Hygiene: Moderate assistance;Sit to/from stand       Functional mobility during ADLs: Moderate assistance;Rolling walker;Cueing for safety General ADL Comments: limited mobility due to weakness     Vision Patient Visual Report: No change from baseline Additional Comments: Decreased visual attnetion to L. Will further assess     Perception Perception Spatial deficits: decreased safety with use of space   Praxis Praxis Praxis tested?: Within functional limits    Pertinent Vitals/Pain Pain Assessment: Faces Faces Pain Scale: Hurts a little bit Pain Location: hands/feet from neuropathy Pain Descriptors / Indicators: Burning Pain Intervention(s): Limited activity within patient's tolerance     Hand Dominance Right   Extremity/Trunk Assessment Upper Extremity Assessment Upper Extremity Assessment: RUE deficits/detail RUE Deficits / Details: Attempting ot use . Moving out of synergy pattern/isolated movment. Brunstrom level IV/V. Difficulty with fine motor control RUE Coordination: decreased fine motor;decreased gross motor   Lower Extremity Assessment Lower Extremity Assessment: Defer to PT evaluation RLE Deficits / Details: midfoot amputaon LLE Deficits / Details: weaker LLE which she is reliant on dueto midfoot ampuation on R   Cervical / Trunk Assessment Cervical / Trunk Assessment: Normal   Communication Communication Communication: Expressive difficulties   Cognition Arousal/Alertness: Awake/alert Behavior During Therapy: WFL for tasks assessed/performed Overall Cognitive Status:  Within Functional Limits for tasks assessed                                 General Comments: will further assess cognition   General Comments       Exercises     Shoulder Instructions      Home Living Family/patient expects to be discharged to:: Private residence (to her god daughter's home) Living Arrangements: Other (Comment) (god daughter's help) Available Help at Discharge: Family Type of Home: House (patients) Home Access: Stairs to enter [employer] of Steps: 2 (pt's home) Entrance Stairs-Rails: Right;Left Home Layout: One level     Bathroom Shower/Tub: Teacher, early years/pre: [place]: Environmental consultant - 2 wheels;Bedside commode;Tub bench   Additional Comments: information is for pt's home.      Prior Functioning/Environment Level of Independence: Independent with assistive device(s)                 OT Problem List: Decreased strength;Decreased range of motion;Decreased activity tolerance;Impaired balance (sitting and/or standing);Decreased coordination;Decreased safety awareness;Decreased knowledge of use of DME or AE;Obesity;Impaired tone;Impaired UE functional use;Pain      OT Treatment/Interventions: Self-care/ADL training;Therapeutic exercise;Neuromuscular education;DME and/or AE instruction;Therapeutic activities;Cognitive remediation/compensation;Visual/perceptual remediation/compensation;Patient/family education;Balance training    OT Goals(Current goals can be found in the care plan section) Acute Rehab OT Goals Patient Stated Goal: Ultimately get back home alone OT Goal Formulation: With patient Time For Goal Achievement: 08/05/17 Potential to Achieve Goals: Good ADL Goals Pt Will Perform Lower Body Bathing: with modified independence;sit to/from stand Pt Will Perform Lower Body Dressing: with modified independence;sit to/from stand Pt Will Transfer to Toilet: with modified independence;bedside  commode Pt Will Perform Toileting - Clothing Manipulation and hygiene: with modified independence;sitting/lateral leans  OT Frequency: Min 2X/week   Barriers to D/C:            Co-evaluation              AM-PAC PT "6 Clicks" Daily Activity     Outcome Measure Help from another person eating meals?: None Help from another person taking care of personal grooming?: A Little Help from another person toileting, which includes using toliet, bedpan, or urinal?: A Lot Help from another person bathing (including washing, rinsing, drying)?: A Little Help from another person to put on and taking off regular upper body clothing?: A Little Help from another person to put on and taking off regular lower body clothing?: A Lot 6 Click Score: 17   End of Session Equipment Utilized During Treatment: Gait belt;Rolling walker Nurse Communication: Mobility status  Activity Tolerance: Patient tolerated treatment well Patient left: in chair;with call bell/phone within reach;with chair alarm set  OT Visit Diagnosis: Other abnormalities of gait and mobility (R26.89);Muscle weakness (generalized) (M62.81);Hemiplegia and hemiparesis Hemiplegia - Right/Left: Left Hemiplegia - dominant/non-dominant: Non-Dominant Hemiplegia - caused by: Cerebral infarction                Time: 1000-1021 OT Time Calculation (min): 21 min Charges:  OT General Charges $OT Visit: 1 Visit OT Evaluation $OT Eval Moderate Complexity: 1 Mod G-Codes:     Hawa Henly, OT/L  [phone] 07/22/2017  Graciano Batson,HILLARY 07/22/2017, 10:34 AM

## 2017-07-22 NOTE — Care Management Note (Signed)
Case Management Note  Patient Details  Name: Tara Hopkins MRN: [id number] Date of Birth: Jan 12, 1951  Subjective/Objective:  Pt admitted with CVA. She is from home alone.                    Action/Plan: PT/OT recommending CIR. CM following for d/c disposition.  Expected Discharge Date:                  Expected Discharge Plan:  [place]  In-House Referral:     Discharge planning Services  CM Consult  Post Acute Care Choice:    Choice offered to:     DME Arranged:    DME Agency:     HH Arranged:    [place] Agency:     Status of Service:  In process, will continue to follow  If discussed at Long Length of Stay Meetings, dates discussed:    Additional Comments:  Pollie Friar, RN 07/22/2017, 3:05 PM

## 2017-07-22 NOTE — Progress Notes (Signed)
SLP Cancellation Note  Patient Details Name: Tara Hopkins MRN: [id number] DOB: 1950-12-08   Cancelled treatment:       Reason Eval/Treat Not Completed: Other (comment). Pt with known history of cognitive impairment with plan for f/u with CIR. Will defer acute SLP eval to next level of care.    Daryl Quiros, Katherene Ponto 07/22/2017, 11:00 AM

## 2017-07-22 NOTE — Progress Notes (Signed)
[facility]                                                                              Patient Demographics  Tara Hopkins, is a 66 y.o. female, DOB - May 10, 1951, PYP:[id number]  Admit date - 07/20/2017   Admitting Physician Norval Morton, MD  Outpatient Primary MD for the patient is [name], NP  Outpatient specialists:   LOS - 1  days   Medical records reviewed and are as summarized below:    Chief Complaint  Patient presents with  . Extremity Weakness    left side        Brief summary   Plan admit note by Dr. Barbaraann Faster on 9/12  Tara Hopkins is a 66 y.o. female with medical history significant of anemia, anxiety/Bipolar, DM, glaucoma, HTN, HLD, peripheral neuropathy, CVA w/ L sided paralysis.  Pt reports awaking on 07/20/17 with speech difficulty predominantly described as slurring of speech. Patient didn't do anything for her symptoms prevent cannot around the middle of the day. When patient awoke at approximately 15:00 the L side of her body had become weak.  CT head showed no acute intracranial abnormalities  Assessment & Plan    Principal Problem:   CVA (cerebral vascular accident) ([place]) Presented with left-sided weakness - CT head was negative for acute intracranial abnormality - MRI of the brain showed subcentimeter acute/early subacute infarction in the right posterior limb of the internal capsule, no acute hemorrhage. Moderate chronic microvascular ischemic changes. Chronic lacunar infarcts of the basal ganglia and pons - MRA head and neck: No acute findings, intracranial stenosis was significantly affecting the left PCA, high-grade P2 present stenosis. Neck MRA showed ~40% left ICA origin stenosis, 60% proximal left subclavian stenosis - LDL 87  - hemoglobin A1c 9.2 -  continue Lipitor, placed on Plavix  - Neurology following, PT evaluation recommended CIR   Active Problems:   DM type 2 with diabetic peripheral neuropathy  (HCC) - Continue sliding scale insulin  - Hemoglobin A1c 9.2    Essential hypertension - Continue hydralazine as needed with parameters, permissive hypertension for 24 hours    Hyperlipidemia - LDL 87, continue Lipitor   Anxiety Continue Klonopin 0.5mg  TID  Code Status: full code  DVT Prophylaxis:   heparin  Family Communication: Discussed in detail with the patient, all imaging results, lab results explained to the patient    Disposition Plan:   Time Spent in minutes  25 minutes  Procedures:  MRI brain MRA head and neck   Consultants:    neurology   Antimicrobials:      Medications  Scheduled Meds: .  stroke: mapping our early stages of recovery book   Does not apply Once  . atorvastatin  40 mg Oral q1800  . clonazePAM  0.5 mg Oral TID  . clopidogrel  75 mg Oral Daily  . gabapentin  1,200 mg Oral TID  . heparin  5,000 Units Subcutaneous Q8H  . insulin aspart  0-5 Units Subcutaneous QHS  . insulin aspart  0-9 Units Subcutaneous TID WC  . insulin glargine  15 Units Subcutaneous QHS   Continuous Infusions: .  sodium chloride 50 mL/hr at 07/22/17 1151   PRN Meds:.acetaminophen **OR** acetaminophen, hydrALAZINE, nicotine polacrilex, ondansetron **OR** ondansetron (ZOFRAN) IV   Antibiotics   Anti-infectives    None        Subjective:   [name] was seen and examined today.  Feeling a lot better today, feels her symptoms are improving. Patient denies dizziness, chest pain, shortness of breath, abdominal pain, N/V/D/C. No acute events overnight.    Objective:   Vitals:   07/21/17 2130 07/22/17 0126 07/22/17 0500 07/22/17 1049  BP: (!) 141/58 (!) 160/86 126/67 (!) 178/69  Pulse: 62 (!) 54 (!) 54 (!) 56  Resp: 16 16 18 18   Temp: 97.7 F (36.5 C) 97.7 F (36.5 C) 98 F (36.7 C) 98.2 F (36.8 C)  TempSrc: Oral Oral Oral Oral  SpO2: 98% 94% 98% 97%  Weight:      Height:        Intake/Output Summary (Last 24 hours) at 07/22/17  1321 Last data filed at 07/21/17 2100  Gross per 24 hour  Intake              615 ml  Output              500 ml  Net              115 ml     Wt Readings from Last 3 Encounters:  07/21/17 84.1 kg (185 lb 6.5 oz)  06/10/17 86.6 kg (191 lb)  04/07/17 85.1 kg (187 lb 11.2 oz)     Exam  General: Alert and oriented x 3, NAD  Eyes: PERRLA, EOMI  HEENT:  Atraumatic, normocephalic  Cardiovascular: S1 S2 auscultated, no rubs, murmurs or gallops. Regular rate and rhythm.  Respiratory: Clear to auscultation bilaterally, no wheezing, rales or rhonchi  Gastrointestinal: Soft, nontender, nondistended, + bowel sounds  Ext: no pedal edema bilaterally  Neuro: AAOx3, Cr N's II- XII. Strength 5/5 RUE, RLE. 4/5 LUE, 5/5 LLE   Musculoskeletal: No digital cyanosis, clubbing  Skin: No rashes  Psych: Normal affect and demeanor, alert and oriented x3    Data Reviewed:  I have personally reviewed following labs and imaging studies  Micro Results No results found for this or any previous visit (from the past 240 hour(s)).  Radiology Reports Ct Head Wo Contrast  Result Date: 07/20/2017 CLINICAL DATA:  Focal neurologic deficit for greater than 6 hours, stroke suspected. Left-sided weakness. EXAM: CT HEAD WITHOUT CONTRAST TECHNIQUE: Contiguous axial images were obtained from the base of the skull through the vertex without intravenous contrast. COMPARISON:  Head CT 07/17/2015 FINDINGS: Brain: No acute hemorrhage. No evidence of acute territorial ischemia. Generalized atrophy and chronic small vessel ischemic changes, stable from prior. Remote lacunar infarct in the periventricular white matter, unchanged. No hydrocephalus, midline shift or mass effect. Vascular: Atherosclerosis of skullbase vasculature without hyperdense vessel or abnormal calcification. Skull: No skull fracture or focal lesion. Sinuses/Orbits: Frontal sinuses and left mastoid air cells are hypoplastic. No acute finding. No  inflammatory change. Bilateral cataract resection. Other: None. IMPRESSION: 1. No acute intracranial abnormality. No evidence of acute ischemia on noncontrast head CT. 2. Stable atrophy, chronic small vessel ischemia and remote lacunar infarct. Electronically Signed   By: Jeb Levering M.D.   On: 07/20/2017 23:58   Mr Jodene Nam Neck W Wo Contrast  Result Date: 07/21/2017 CLINICAL DATA:  Stroke workup. EXAM: MRA NECK WITHOUT AND WITH CONTRAST MRA HEAD WITHOUT CONTRAST TECHNIQUE: Multiplanar and multiecho pulse sequences of the  neck were obtained without and with intravenous contrast. Angiographic images of the neck were obtained using MRA technique without and with intravenous contast.; Angiographic images of the Circle of Willis were obtained using MRA technique without intravenous contrast. CONTRAST:  17mL MULTIHANCE GADOBENATE DIMEGLUMINE 529 MG/ML IV SOLN COMPARISON:  Brain MRI from earlier today FINDINGS: MRA NECK FINDINGS The arch has a normal diameter. Time-of-flight shows antegrade flow in both carotid and vertebral circulations. There is a 3 vessel arch branching. Proximal common carotid on the right is not well visualized due to artifact. The brachiocephalic is widely patent. Some atheromatous changes at the right common carotid bifurcation without flow limiting stenosis or ulceration. No indication of dissection. The left common carotid is diffusely patent. Left ICA origin stenosis measuring 40%. Negative for beading or dissection. Bilateral vertebral artery origin stenosis with bilateral flow gap. There is a stenosis of the proximal left subclavian artery which is difficult to quantify due to scan limitations, but at minimum there is 60% narrowing. Other than the V1 segments, the vertebral arteries are smooth and diffusely patent. Incidental goiter. MRA HEAD FINDINGS Strongly dominant right vertebral artery. Most of the left vertebral artery flow goes to a large PICA. Dominant right AICA. The vertebral  and basilar arteries are smooth and widely patent. Atheromatous irregularity of the left more than right PCA, with a high-grade stenoses of both left P2 branches. Symmetric carotid arteries and branching. There is a low moderate right proximal M1 segment stenosis, considered atheromatous. Negative for aneurysm, branch occlusion, or generalized beading. IMPRESSION: Intracranial MRA: 1. No acute finding. 2. Intracranial atherosclerosis most significantly affecting the left PCA where there are high-grade P2 branch stenoses. 3. Moderate right proximal M1 segment stenosis. Neck MRA: 1. ~40% left ICA origin stenosis. 2. At least 60% proximal left subclavian stenosis. 3. Bilateral high-grade vertebral origins stenoses with flow gaps. 4. Goiter. Electronically Signed   By: Monte Fantasia M.D.   On: 07/21/2017 10:56   Mr Brain Wo Contrast  Result Date: 07/21/2017 CLINICAL DATA:  66 y/o F; increase left extremity weakness and slurred speech. History of stroke with left-sided deficits. EXAM: MRI HEAD WITHOUT CONTRAST TECHNIQUE: Multiplanar, multiecho pulse sequences of the brain and surrounding structures were obtained without intravenous contrast. COMPARISON:  07/20/2017 CT of the head FINDINGS: Brain: Subcentimeter focus of reduced diffusion in the right posterior limb of internal capsule. Mild associated T2 hyperintensity. No acute hemorrhage. Moderate chronic microvascular ischemic changes and mild brain parenchymal volume loss. Chronic lacunar infarcts within the right paramedian pons, bilateral thalamus, left mid corona radiata, and right posterior corona radiata. No abnormal susceptibility hypointensity to indicate intracranial hemorrhage. No hydrocephalus or effacement of basilar cisterns. No focal mass effect. Vascular:  Normal flow voids. Skull and upper cervical spine: Normal marrow signal. Sinuses/Orbits: Negative. Other: None. IMPRESSION: 1. Subcentimeter acute/early subacute infarction in right posterior  limb of internal capsule. No acute hemorrhage. 2. Moderate chronic microvascular ischemic changes and moderate parenchymal volume loss of the brain. Chronic lacunar infarcts of basal ganglia and pons. These results will be called to the ordering clinician or representative by the Radiologist Assistant, and communication documented in the PACS or zVision Dashboard. Electronically Signed   By: Kristine Garbe M.D.   On: 07/21/2017 04:59   Mr Jodene Nam Head Wo Contrast  Result Date: 07/21/2017 CLINICAL DATA:  Stroke workup. EXAM: MRA NECK WITHOUT AND WITH CONTRAST MRA HEAD WITHOUT CONTRAST TECHNIQUE: Multiplanar and multiecho pulse sequences of the neck were obtained without and with intravenous contrast.  Angiographic images of the neck were obtained using MRA technique without and with intravenous contast.; Angiographic images of the Circle of Willis were obtained using MRA technique without intravenous contrast. CONTRAST:  12mL MULTIHANCE GADOBENATE DIMEGLUMINE 529 MG/ML IV SOLN COMPARISON:  Brain MRI from earlier today FINDINGS: MRA NECK FINDINGS The arch has a normal diameter. Time-of-flight shows antegrade flow in both carotid and vertebral circulations. There is a 3 vessel arch branching. Proximal common carotid on the right is not well visualized due to artifact. The brachiocephalic is widely patent. Some atheromatous changes at the right common carotid bifurcation without flow limiting stenosis or ulceration. No indication of dissection. The left common carotid is diffusely patent. Left ICA origin stenosis measuring 40%. Negative for beading or dissection. Bilateral vertebral artery origin stenosis with bilateral flow gap. There is a stenosis of the proximal left subclavian artery which is difficult to quantify due to scan limitations, but at minimum there is 60% narrowing. Other than the V1 segments, the vertebral arteries are smooth and diffusely patent. Incidental goiter. MRA HEAD FINDINGS Strongly  dominant right vertebral artery. Most of the left vertebral artery flow goes to a large PICA. Dominant right AICA. The vertebral and basilar arteries are smooth and widely patent. Atheromatous irregularity of the left more than right PCA, with a high-grade stenoses of both left P2 branches. Symmetric carotid arteries and branching. There is a low moderate right proximal M1 segment stenosis, considered atheromatous. Negative for aneurysm, branch occlusion, or generalized beading. IMPRESSION: Intracranial MRA: 1. No acute finding. 2. Intracranial atherosclerosis most significantly affecting the left PCA where there are high-grade P2 branch stenoses. 3. Moderate right proximal M1 segment stenosis. Neck MRA: 1. ~40% left ICA origin stenosis. 2. At least 60% proximal left subclavian stenosis. 3. Bilateral high-grade vertebral origins stenoses with flow gaps. 4. Goiter. Electronically Signed   By: Monte Fantasia M.D.   On: 07/21/2017 10:56    Lab Data:  CBC:  Recent Labs Lab 07/20/17 2355 07/22/17 0456  WBC 10.2 8.7  NEUTROABS 6.1  --   HGB 11.1* 12.0  HCT 35.4* 39.1  MCV 88.1 89.3  PLT 314 093   Basic Metabolic Panel:  Recent Labs Lab 07/20/17 2355 07/22/17 0456  NA 142 141  K 3.7 3.9  CL 110 109  CO2 24 27  GLUCOSE 110* 72  BUN 18 15  CREATININE 1.46* 1.39*  CALCIUM 9.1 8.8*   GFR: Estimated Creatinine Clearance: 43.2 mL/min (A) (by C-G formula based on SCr of 1.39 mg/dL (H)). Liver Function Tests:  Recent Labs Lab 07/20/17 2355  AST 19  ALT 15  ALKPHOS 67  BILITOT 0.4  PROT 6.2*  ALBUMIN 3.1*   No results for input(s): LIPASE, AMYLASE in the last 168 hours. No results for input(s): AMMONIA in the last 168 hours. Coagulation Profile:  Recent Labs Lab 07/21/17 0049  INR 1.02   Cardiac Enzymes: No results for input(s): CKTOTAL, CKMB, CKMBINDEX, TROPONINI in the last 168 hours. BNP (last 3 results) No results for input(s): PROBNP in the last 8760  hours. HbA1C:  Recent Labs  07/21/17 0823  HGBA1C 9.2*   CBG:  Recent Labs Lab 07/21/17 1313 07/21/17 1618 07/21/17 2145 07/22/17 0820 07/22/17 1123  GLUCAP 170* 143* 120* 73 167*   Lipid Profile:  Recent Labs  07/22/17 0456  CHOL 166  HDL 41  LDLCALC 87  TRIG 191*  CHOLHDL 4.0   Thyroid Function Tests: No results for input(s): TSH, T4TOTAL, FREET4, T3FREE, THYROIDAB in the  last 72 hours. Anemia Panel: No results for input(s): VITAMINB12, FOLATE, FERRITIN, TIBC, IRON, RETICCTPCT in the last 72 hours. Urine analysis:    Component Value Date/Time   COLORURINE YELLOW 07/21/2017 [place] 07/21/2017 0347   LABSPEC 1.017 07/21/2017 0347   PHURINE 5.0 07/21/2017 0347   GLUCOSEU NEGATIVE 07/21/2017 0347   HGBUR NEGATIVE 07/21/2017 0347   BILIRUBINUR NEGATIVE 07/21/2017 0347   KETONESUR NEGATIVE 07/21/2017 0347   PROTEINUR >=300 (A) 07/21/2017 0347   UROBILINOGEN 0.2 07/17/2015 1016   NITRITE NEGATIVE 07/21/2017 0347   LEUKOCYTESUR NEGATIVE 07/21/2017 0347     Ripudeep Rai M.D. [facility] 07/22/2017, 1:21 PM  Pager: [phone] Between 7am to 7pm - call Pager - [phone]  After 7pm go to [email or web address] - password [email or web address]  Call night coverage person covering after 7pm

## 2017-07-22 NOTE — Consult Note (Signed)
Physical Medicine and Rehabilitation Consult  Reason for Consult: Stroke with worsening of left sided weakness Referring Physician: Dr. Tana Coast.    HPI: Tara Hopkins is a 66 y.o. female with history of HTN, T2DM with peripheral neuropathy, bipolar disorder, polysubstance abuse, CVA with residual L-HP--CIR stay, who was admitted on 07/21/17 with worsening of left sided weakness. MRI brain revealed acute/subacute infarct in right posterior limb of internal capsule and moderate parenchymal volume loss of brain with moderate SVD. MRA brain/neck showed intracranial stenosis affecting left PCA with high grade stenosis P2 branches, 40% stenosis L-ICA, 60% stensis L-SA and bilateral high grade vertebral origin stenosis. 2D echo showed EF 55-60% with no wall abnormality and grade 2 diastolic dysfunction. UDS positive for THC and patient admitted to using cocaine 3-4 weeks ago. Neurology recommended changing ASA to plavix and starting high intensity statin. PT evaluation done yesterday and CIR recommended due to deficits in mobility.    Review of Systems  Constitutional: Negative for chills and fever.  HENT: Negative for hearing loss and tinnitus.   Eyes: Negative for blurred vision and double vision.  Respiratory: Negative for cough, sputum production and shortness of breath.   Cardiovascular: Negative for chest pain and palpitations.  Gastrointestinal: Positive for constipation. Negative for heartburn and nausea.  Genitourinary: Negative for dysuria and urgency.  Musculoskeletal: Negative for back pain, myalgias and neck pain.  Skin: Negative for itching and rash.  Neurological: Positive for tingling, sensory change and focal weakness. Negative for dizziness and headaches.  Psychiatric/Behavioral: The patient is not nervous/anxious and does not have insomnia.       Past Medical History:  Diagnosis Date  . Anemia   . Anxiety   . Bipolar 1 disorder ([place])   . Cataracts, bilateral   .  Depression   . Diabetes mellitus without complication ([place])   . Glaucoma   . History of blood transfusion   . History of hyperbaric oxygen therapy    pt reports 80 treatments.  . Hypercholesteremia   . Hypertension   . Peripheral neuropathy   . Stroke [facility]) 02/2015    Past Surgical History:  Procedure Laterality Date  . AMPUTATION Right 12/15/2013   Procedure: Right Transmetatarsal Amputation;  Surgeon: Newt Minion, MD;  Location: [place];  Service: Orthopedics;  Laterality: Right;  . AMPUTATION Right 02/23/2014   Procedure: AMPUTATION FOOT;  Surgeon: Newt Minion, MD;  Location: [place];  Service: Orthopedics;  Laterality: Right;  Right Foot Revision Transmetatarsal Amputation  . I&D EXTREMITY Right 05/23/2013   Procedure: IRRIGATION AND DEBRIDEMENT RIGHT GREAT TOE;  Surgeon: Mauri Pole, MD;  Location: [facility];  Service: Orthopedics;  Laterality: Right;    Family History  Problem Relation Age of Onset  . Liver cancer Mother   . Lung cancer Mother   . Depression Mother   . Hypertension Father   . Depression Father   . Colon cancer Neg Hx     Social History:  Lives alone--independent PTA. Ex husband assists with groceries.  Disabled. Uses cane/wheelfchair in the home and walker out of home. She reports that she has been smoking Cigarettes--has cut down to 2 cigarettes daily.  She has never used smokeless tobacco. She reports that she drinks alcohol. She reports that she uses marijuana to help with neuropathy.     Allergies: No Known Allergies    Medications Prior to Admission  Medication Sig Dispense Refill  . clonazePAM (KLONOPIN) 0.5 MG tablet take 1 tablet  by mouth three times a day 90 tablet 0  . gabapentin (NEURONTIN) 400 MG capsule Take 3 capsules (1,200 mg total) by mouth 3 (three) times daily. 270 capsule 5  . glimepiride (AMARYL) 4 MG tablet take 1 tablet once daily 90 tablet 3  . Insulin Glargine (BASAGLAR KWIKPEN) 100 UNIT/ML SOPN Inject 0.38 mLs (38 Units total)  into the skin at bedtime. 9 mL 6  . lisinopril (PRINIVIL,ZESTRIL) 20 MG tablet Take 1 tablet (20 mg total) by mouth daily. 90 tablet 3  . simvastatin (ZOCOR) 40 MG tablet Take 1 tablet (40 mg total) by mouth at bedtime. 90 tablet 1  . aspirin EC 325 MG EC tablet Take 1 tablet (325 mg total) by mouth daily. 30 tablet 0  . COD LIVER OIL PO Take 1 capsule by mouth daily.    Marland Kitchen glucose blood test strip Test blood sugars 3 times daily Dx: E11.42 100 each 12  . Insulin Pen Needle (B-D UF III MINI PEN NEEDLES) 31G X 5 MM MISC Use as directed. 100 each 3  . lamoTRIgine (LAMICTAL) 100 MG tablet take 1/2 tablet by mouth twice a day (Patient not taking: Reported on 07/21/2017) 30 tablet 5  . ONE TOUCH LANCETS MISC USE TO CHECK BLOOD SUGAR 3 TIMES DAILY 200 each 3    Home: Home Living Family/patient expects to be discharged to:: Private residence (to her god daughter's home) Living Arrangements: Other (Comment) (god daughter's help) Available Help at Discharge: Family Type of Home: House (patients) Home Access: Stairs to enter Technical brewer of Steps: 2 (pt's home) Entrance Stairs-Rails: Right, Left Home Layout: One level Bathroom Shower/Tub: Chiropodist: Standard Home Equipment: Walker - 2 wheels, Bedside commode, Tub bench Additional Comments: information is for pt's home.  Functional History: Prior Function Level of Independence: Independent with assistive device(s) Comments: husband (separated) takes her to get groceries. Functional Status:  Mobility: Bed Mobility Overal bed mobility: Needs Assistance Bed Mobility: Rolling, Sidelying to Sit, Sit to Sidelying Rolling: Min guard Sidelying to sit: Min assist Sit to sidelying: Mod assist General bed mobility comments: pt used rail to roll R and assist coming up from R. Pt given some truncal support. Transfers Overall transfer level: Needs assistance Transfers: Sit to/from Stand Sit to Stand: Min assist General  transfer comment: assisted pt coming forward and for stability.  Activity limited to standing. pt with difficulty moving for feet to takes steps up toward [facility]. Ambulation/Gait General Gait Details: Not tested, attempted sidestepping with difficulty and aborted in lieu of sitting down further up in the bed.    ADL:    Cognition: Cognition Overall Cognitive Status: Within Functional Limits for tasks assessed Orientation Level: Oriented X4, Oriented to person, Oriented to place, Oriented to time, Oriented to situation Cognition Arousal/Alertness: Awake/alert Behavior During Therapy: [facility] for tasks assessed/performed Overall Cognitive Status: Within Functional Limits for tasks assessed  Blood pressure 126/67, pulse (!) 54, temperature 98 F (36.7 C), temperature source Oral, resp. rate 18, height 5\' 5"  (1.651 m), weight 84.1 kg (185 lb 6.5 oz), SpO2 98 %. Physical Exam  Nursing note and vitals reviewed. Constitutional: She is oriented to person, place, and time. She appears well-developed and well-nourished.  Obese female in NAD.   HENT:  Head: Normocephalic and atraumatic.  Eyes: Pupils are equal, round, and reactive to light. Conjunctivae are normal.  Neck: Normal range of motion. Neck supple.  Cardiovascular: Normal rate and regular rhythm.   Respiratory: Effort normal and breath sounds normal.  No stridor.  GI: Soft. Bowel sounds are normal. She exhibits no distension. There is no tenderness.  Musculoskeletal: She exhibits no edema or tenderness.  Right foot with transmetatarsal amputation.    Neurological: She is alert and oriented to person, place, and time.  Mild dysarthria. Able to follow commands without difficulty. Decrease in fine motor movements LUE. LUE 3 to 3+/5. LLE 3+/5. Decreased balance to left. Fair insight  Skin: Skin is warm and dry. No rash noted. No erythema.  Psychiatric: She has a normal mood and affect. Her behavior is normal. Thought content normal.     Results for orders placed or performed during the hospital encounter of 07/20/17 (from the past 24 hour(s))  HIV antibody (Routine Testing)     Status: None   Collection Time: 07/21/17  8:22 AM  Result Value Ref Range   HIV Screen 4th Generation wRfx Non Reactive Non Reactive  Hemoglobin A1c     Status: Abnormal   Collection Time: 07/21/17  8:23 AM  Result Value Ref Range   Hgb A1c MFr Bld 9.2 (H) 4.8 - 5.6 %   Mean Plasma Glucose 217.34 mg/dL  CBG monitoring, ED     Status: Abnormal   Collection Time: 07/21/17  9:03 AM  Result Value Ref Range   Glucose-Capillary 53 (L) 65 - 99 mg/dL  CBG monitoring, ED     Status: Abnormal   Collection Time: 07/21/17  9:22 AM  Result Value Ref Range   Glucose-Capillary 61 (L) 65 - 99 mg/dL   Comment 1 Notify RN    Comment 2 Document in Chart   CBG monitoring, ED     Status: Abnormal   Collection Time: 07/21/17 10:30 AM  Result Value Ref Range   Glucose-Capillary 206 (H) 65 - 99 mg/dL   Comment 1 Notify RN    Comment 2 Document in Chart   CBG monitoring, ED     Status: Abnormal   Collection Time: 07/21/17  1:13 PM  Result Value Ref Range   Glucose-Capillary 170 (H) 65 - 99 mg/dL   Comment 1 Notify RN    Comment 2 Document in Chart   Glucose, capillary     Status: Abnormal   Collection Time: 07/21/17  4:18 PM  Result Value Ref Range   Glucose-Capillary 143 (H) 65 - 99 mg/dL   Comment 1 Notify RN    Comment 2 Document in Chart   Glucose, capillary     Status: Abnormal   Collection Time: 07/21/17  9:45 PM  Result Value Ref Range   Glucose-Capillary 120 (H) 65 - 99 mg/dL   Comment 1 Notify RN    Comment 2 Document in Chart   CBC     Status: None   Collection Time: 07/22/17  4:56 AM  Result Value Ref Range   WBC 8.7 4.0 - 10.5 K/uL   RBC 4.38 3.87 - 5.11 MIL/uL   Hemoglobin 12.0 12.0 - 15.0 g/dL   HCT 39.1 36.0 - 46.0 %   MCV 89.3 78.0 - 100.0 fL   MCH 27.4 26.0 - 34.0 pg   MCHC 30.7 30.0 - 36.0 g/dL   RDW 15.5 11.5 - 15.5 %    Platelets 317 150 - 400 K/uL  Basic metabolic panel     Status: Abnormal   Collection Time: 07/22/17  4:56 AM  Result Value Ref Range   Sodium 141 135 - 145 mmol/L   Potassium 3.9 3.5 - 5.1 mmol/L   Chloride 109 101 -  111 mmol/L   CO2 27 22 - 32 mmol/L   Glucose, Bld 72 65 - 99 mg/dL   BUN 15 6 - 20 mg/dL   Creatinine, Ser 1.39 (H) 0.44 - 1.00 mg/dL   Calcium 8.8 (L) 8.9 - 10.3 mg/dL   GFR calc non Af Amer 39 (L) >60 mL/min   GFR calc Af Amer 45 (L) >60 mL/min   Anion gap 5 5 - 15  Lipid panel     Status: Abnormal   Collection Time: 07/22/17  4:56 AM  Result Value Ref Range   Cholesterol 166 0 - 200 mg/dL   Triglycerides 191 (H) <150 mg/dL   HDL 41 >40 mg/dL   Total CHOL/HDL Ratio 4.0 RATIO   VLDL 38 0 - 40 mg/dL   LDL Cholesterol 87 0 - 99 mg/dL   Ct Head Wo Contrast  Result Date: 07/20/2017 CLINICAL DATA:  Focal neurologic deficit for greater than 6 hours, stroke suspected. Left-sided weakness. EXAM: CT HEAD WITHOUT CONTRAST TECHNIQUE: Contiguous axial images were obtained from the base of the skull through the vertex without intravenous contrast. COMPARISON:  Head CT 07/17/2015 FINDINGS: Brain: No acute hemorrhage. No evidence of acute territorial ischemia. Generalized atrophy and chronic small vessel ischemic changes, stable from prior. Remote lacunar infarct in the periventricular white matter, unchanged. No hydrocephalus, midline shift or mass effect. Vascular: Atherosclerosis of skullbase vasculature without hyperdense vessel or abnormal calcification. Skull: No skull fracture or focal lesion. Sinuses/Orbits: Frontal sinuses and left mastoid air cells are hypoplastic. No acute finding. No inflammatory change. Bilateral cataract resection. Other: None. IMPRESSION: 1. No acute intracranial abnormality. No evidence of acute ischemia on noncontrast head CT. 2. Stable atrophy, chronic small vessel ischemia and remote lacunar infarct. Electronically Signed   By: Jeb Levering M.D.    On: 07/20/2017 23:58   Mr Jodene Nam Neck W Wo Contrast  Result Date: 07/21/2017 CLINICAL DATA:  Stroke workup. EXAM: MRA NECK WITHOUT AND WITH CONTRAST MRA HEAD WITHOUT CONTRAST TECHNIQUE: Multiplanar and multiecho pulse sequences of the neck were obtained without and with intravenous contrast. Angiographic images of the neck were obtained using MRA technique without and with intravenous contast.; Angiographic images of the Circle of Willis were obtained using MRA technique without intravenous contrast. CONTRAST:  62mL MULTIHANCE GADOBENATE DIMEGLUMINE 529 MG/ML IV SOLN COMPARISON:  Brain MRI from earlier today FINDINGS: MRA NECK FINDINGS The arch has a normal diameter. Time-of-flight shows antegrade flow in both carotid and vertebral circulations. There is a 3 vessel arch branching. Proximal common carotid on the right is not well visualized due to artifact. The brachiocephalic is widely patent. Some atheromatous changes at the right common carotid bifurcation without flow limiting stenosis or ulceration. No indication of dissection. The left common carotid is diffusely patent. Left ICA origin stenosis measuring 40%. Negative for beading or dissection. Bilateral vertebral artery origin stenosis with bilateral flow gap. There is a stenosis of the proximal left subclavian artery which is difficult to quantify due to scan limitations, but at minimum there is 60% narrowing. Other than the V1 segments, the vertebral arteries are smooth and diffusely patent. Incidental goiter. MRA HEAD FINDINGS Strongly dominant right vertebral artery. Most of the left vertebral artery flow goes to a large PICA. Dominant right AICA. The vertebral and basilar arteries are smooth and widely patent. Atheromatous irregularity of the left more than right PCA, with a high-grade stenoses of both left P2 branches. Symmetric carotid arteries and branching. There is a low moderate right proximal M1  segment stenosis, considered atheromatous. Negative  for aneurysm, branch occlusion, or generalized beading. IMPRESSION: Intracranial MRA: 1. No acute finding. 2. Intracranial atherosclerosis most significantly affecting the left PCA where there are high-grade P2 branch stenoses. 3. Moderate right proximal M1 segment stenosis. Neck MRA: 1. ~40% left ICA origin stenosis. 2. At least 60% proximal left subclavian stenosis. 3. Bilateral high-grade vertebral origins stenoses with flow gaps. 4. Goiter. Electronically Signed   By: Monte Fantasia M.D.   On: 07/21/2017 10:56   Mr Brain Wo Contrast  Result Date: 07/21/2017 CLINICAL DATA:  66 y/o F; increase left extremity weakness and slurred speech. History of stroke with left-sided deficits. EXAM: MRI HEAD WITHOUT CONTRAST TECHNIQUE: Multiplanar, multiecho pulse sequences of the brain and surrounding structures were obtained without intravenous contrast. COMPARISON:  07/20/2017 CT of the head FINDINGS: Brain: Subcentimeter focus of reduced diffusion in the right posterior limb of internal capsule. Mild associated T2 hyperintensity. No acute hemorrhage. Moderate chronic microvascular ischemic changes and mild brain parenchymal volume loss. Chronic lacunar infarcts within the right paramedian pons, bilateral thalamus, left mid corona radiata, and right posterior corona radiata. No abnormal susceptibility hypointensity to indicate intracranial hemorrhage. No hydrocephalus or effacement of basilar cisterns. No focal mass effect. Vascular:  Normal flow voids. Skull and upper cervical spine: Normal marrow signal. Sinuses/Orbits: Negative. Other: None. IMPRESSION: 1. Subcentimeter acute/early subacute infarction in right posterior limb of internal capsule. No acute hemorrhage. 2. Moderate chronic microvascular ischemic changes and moderate parenchymal volume loss of the brain. Chronic lacunar infarcts of basal ganglia and pons. These results will be called to the ordering clinician or representative by the Radiologist  Assistant, and communication documented in the PACS or zVision Dashboard. Electronically Signed   By: Kristine Garbe M.D.   On: 07/21/2017 04:59   Mr Jodene Nam Head Wo Contrast  Result Date: 07/21/2017 CLINICAL DATA:  Stroke workup. EXAM: MRA NECK WITHOUT AND WITH CONTRAST MRA HEAD WITHOUT CONTRAST TECHNIQUE: Multiplanar and multiecho pulse sequences of the neck were obtained without and with intravenous contrast. Angiographic images of the neck were obtained using MRA technique without and with intravenous contast.; Angiographic images of the Circle of Willis were obtained using MRA technique without intravenous contrast. CONTRAST:  59mL MULTIHANCE GADOBENATE DIMEGLUMINE 529 MG/ML IV SOLN COMPARISON:  Brain MRI from earlier today FINDINGS: MRA NECK FINDINGS The arch has a normal diameter. Time-of-flight shows antegrade flow in both carotid and vertebral circulations. There is a 3 vessel arch branching. Proximal common carotid on the right is not well visualized due to artifact. The brachiocephalic is widely patent. Some atheromatous changes at the right common carotid bifurcation without flow limiting stenosis or ulceration. No indication of dissection. The left common carotid is diffusely patent. Left ICA origin stenosis measuring 40%. Negative for beading or dissection. Bilateral vertebral artery origin stenosis with bilateral flow gap. There is a stenosis of the proximal left subclavian artery which is difficult to quantify due to scan limitations, but at minimum there is 60% narrowing. Other than the V1 segments, the vertebral arteries are smooth and diffusely patent. Incidental goiter. MRA HEAD FINDINGS Strongly dominant right vertebral artery. Most of the left vertebral artery flow goes to a large PICA. Dominant right AICA. The vertebral and basilar arteries are smooth and widely patent. Atheromatous irregularity of the left more than right PCA, with a high-grade stenoses of both left P2 branches.  Symmetric carotid arteries and branching. There is a low moderate right proximal M1 segment stenosis, considered atheromatous. Negative for aneurysm,  branch occlusion, or generalized beading. IMPRESSION: Intracranial MRA: 1. No acute finding. 2. Intracranial atherosclerosis most significantly affecting the left PCA where there are high-grade P2 branch stenoses. 3. Moderate right proximal M1 segment stenosis. Neck MRA: 1. ~40% left ICA origin stenosis. 2. At least 60% proximal left subclavian stenosis. 3. Bilateral high-grade vertebral origins stenoses with flow gaps. 4. Goiter. Electronically Signed   By: Monte Fantasia M.D.   On: 07/21/2017 10:56    Assessment/Plan: Diagnosis: Right PLIC infarct. Hx of prior CVA 1. Does the need for close, 24 hr/day medical supervision in concert with the patient's rehab needs make it unreasonable for this patient to be served in a less intensive setting? Yes 2. Co-Morbidities requiring supervision/potential complications: polysubstance abuse, DM 2 with DPN, Htn 3. Due to bladder management, bowel management, safety, skin/wound care, disease management, medication administration, pain management and patient education, does the patient require 24 hr/day rehab nursing? Yes 4. Does the patient require coordinated care of a physician, rehab nurse, PT (1-2 hrs/day, 5 days/week), OT (1-2 hrs/day, 5 days/week) and SLP (1-2 hrs/day, 5 days/week) to address physical and functional deficits in the context of the above medical diagnosis(es)? Yes Addressing deficits in the following areas: balance, endurance, locomotion, strength, transferring, bowel/bladder control, bathing, dressing, feeding, grooming, toileting, cognition, speech and psychosocial support 5. Can the patient actively participate in an intensive therapy program of at least 3 hrs of therapy per day at least 5 days per week? Yes 6. The potential for patient to make measurable gains while on inpatient rehab is  good 7. Anticipated functional outcomes upon discharge from inpatient rehab are modified independent  with PT, modified independent and supervision with OT, modified independent with SLP. 8. Estimated rehab length of stay to reach the above functional goals is: 13-17 days 9. Anticipated D/C setting: Home 10. Anticipated post D/C treatments: HH therapy and Outpatient therapy 11. Overall Rehab/Functional Prognosis: excellent  RECOMMENDATIONS: This patient's condition is appropriate for continued rehabilitative care in the following setting: CIR Patient has agreed to participate in recommended program. Yes Note that insurance prior authorization may be required for reimbursement for recommended care.  Comment: Rehab Admissions Coordinator to follow up.  Thanks,  Meredith Staggers, MD, [place] Pillar, PA-C 07/22/2017

## 2017-07-23 ENCOUNTER — Inpatient Hospital Stay (HOSPITAL_COMMUNITY)
Admission: RE | Admit: 2017-07-23 | Discharge: 2017-08-02 | DRG: 057 | Discharge status: Against Medical Advice | Payer: Medicare Other | Source: Intra-hospital | Attending: Physical Medicine & Rehabilitation | Admitting: Physical Medicine & Rehabilitation

## 2017-07-23 ENCOUNTER — Encounter (HOSPITAL_COMMUNITY): Payer: Self-pay

## 2017-07-23 DIAGNOSIS — I63311 Cerebral infarction due to thrombosis of right middle cerebral artery: Secondary | ICD-10-CM | POA: Diagnosis present

## 2017-07-23 DIAGNOSIS — G8194 Hemiplegia, unspecified affecting left nondominant side: Secondary | ICD-10-CM

## 2017-07-23 DIAGNOSIS — F1490 Cocaine use, unspecified, uncomplicated: Secondary | ICD-10-CM | POA: Diagnosis present

## 2017-07-23 DIAGNOSIS — Z79899 Other long term (current) drug therapy: Secondary | ICD-10-CM

## 2017-07-23 DIAGNOSIS — Z23 Encounter for immunization: Secondary | ICD-10-CM

## 2017-07-23 DIAGNOSIS — Z89431 Acquired absence of right foot: Secondary | ICD-10-CM

## 2017-07-23 DIAGNOSIS — E1165 Type 2 diabetes mellitus with hyperglycemia: Secondary | ICD-10-CM | POA: Diagnosis present

## 2017-07-23 DIAGNOSIS — M542 Cervicalgia: Secondary | ICD-10-CM

## 2017-07-23 DIAGNOSIS — E785 Hyperlipidemia, unspecified: Secondary | ICD-10-CM

## 2017-07-23 DIAGNOSIS — I6300 Cerebral infarction due to thrombosis of unspecified precerebral artery: Secondary | ICD-10-CM | POA: Diagnosis not present

## 2017-07-23 DIAGNOSIS — R531 Weakness: Secondary | ICD-10-CM

## 2017-07-23 DIAGNOSIS — G8191 Hemiplegia, unspecified affecting right dominant side: Secondary | ICD-10-CM | POA: Diagnosis not present

## 2017-07-23 DIAGNOSIS — I69398 Other sequelae of cerebral infarction: Secondary | ICD-10-CM | POA: Diagnosis not present

## 2017-07-23 DIAGNOSIS — I69354 Hemiplegia and hemiparesis following cerebral infarction affecting left non-dominant side: Principal | ICD-10-CM

## 2017-07-23 DIAGNOSIS — E7800 Pure hypercholesterolemia, unspecified: Secondary | ICD-10-CM | POA: Diagnosis present

## 2017-07-23 DIAGNOSIS — I10 Essential (primary) hypertension: Secondary | ICD-10-CM | POA: Diagnosis present

## 2017-07-23 DIAGNOSIS — H409 Unspecified glaucoma: Secondary | ICD-10-CM | POA: Diagnosis present

## 2017-07-23 DIAGNOSIS — F1210 Cannabis abuse, uncomplicated: Secondary | ICD-10-CM | POA: Diagnosis present

## 2017-07-23 DIAGNOSIS — Z794 Long term (current) use of insulin: Secondary | ICD-10-CM

## 2017-07-23 DIAGNOSIS — F319 Bipolar disorder, unspecified: Secondary | ICD-10-CM | POA: Diagnosis present

## 2017-07-23 DIAGNOSIS — I63331 Cerebral infarction due to thrombosis of right posterior cerebral artery: Secondary | ICD-10-CM

## 2017-07-23 DIAGNOSIS — E1142 Type 2 diabetes mellitus with diabetic polyneuropathy: Secondary | ICD-10-CM | POA: Diagnosis present

## 2017-07-23 DIAGNOSIS — F17210 Nicotine dependence, cigarettes, uncomplicated: Secondary | ICD-10-CM | POA: Diagnosis present

## 2017-07-23 DIAGNOSIS — Z7982 Long term (current) use of aspirin: Secondary | ICD-10-CM | POA: Diagnosis not present

## 2017-07-23 DIAGNOSIS — M4802 Spinal stenosis, cervical region: Secondary | ICD-10-CM | POA: Diagnosis not present

## 2017-07-23 DIAGNOSIS — I639 Cerebral infarction, unspecified: Secondary | ICD-10-CM | POA: Diagnosis not present

## 2017-07-23 DIAGNOSIS — G629 Polyneuropathy, unspecified: Secondary | ICD-10-CM

## 2017-07-23 DIAGNOSIS — D62 Acute posthemorrhagic anemia: Secondary | ICD-10-CM | POA: Diagnosis present

## 2017-07-23 DIAGNOSIS — R269 Unspecified abnormalities of gait and mobility: Secondary | ICD-10-CM | POA: Diagnosis not present

## 2017-07-23 LAB — BASIC METABOLIC PANEL
Anion gap: 4 — ABNORMAL LOW (ref 5–15)
BUN: 14 mg/dL (ref 6–20)
CALCIUM: 8.4 mg/dL — AB (ref 8.9–10.3)
CHLORIDE: 109 mmol/L (ref 101–111)
CO2: 26 mmol/L (ref 22–32)
CREATININE: 1.37 mg/dL — AB (ref 0.44–1.00)
GFR calc non Af Amer: 40 mL/min — ABNORMAL LOW (ref >60)
GFR, EST AFRICAN AMERICAN: 46 mL/min — AB (ref >60)
Glucose, Bld: 147 mg/dL — ABNORMAL HIGH (ref 65–99)
Potassium: 4 mmol/L (ref 3.5–5.1)
Sodium: 139 mmol/L (ref 135–145)

## 2017-07-23 LAB — CBC
HCT: 34.8 % — ABNORMAL LOW (ref 36.0–46.0)
Hemoglobin: 10.9 g/dL — ABNORMAL LOW (ref 12.0–15.0)
MCH: 27.7 pg (ref 26.0–34.0)
MCHC: 31.3 g/dL (ref 30.0–36.0)
MCV: 88.5 fL (ref 78.0–100.0)
PLATELETS: 284 10*3/uL (ref 150–400)
RBC: 3.93 MIL/uL (ref 3.87–5.11)
RDW: 15 % (ref 11.5–15.5)
WBC: 7.3 10*3/uL (ref 4.0–10.5)

## 2017-07-23 LAB — GLUCOSE, CAPILLARY
GLUCOSE-CAPILLARY: 145 mg/dL — AB (ref 65–99)
Glucose-Capillary: 135 mg/dL — ABNORMAL HIGH (ref 65–99)
Glucose-Capillary: 98 mg/dL (ref 65–99)
Glucose-Capillary: 120 mg/dL — ABNORMAL HIGH (ref 65–99)

## 2017-07-23 MED ORDER — PROCHLORPERAZINE MALEATE 5 MG PO TABS: 5.0000 mg | ORAL_TABLET | Freq: Four times a day (QID) | ORAL | Status: DC | PRN

## 2017-07-23 MED ORDER — ALUM & MAG HYDROXIDE-SIMETH 200-200-20 MG/5ML PO SUSP: 30.0000 mL | ORAL | Status: DC | PRN

## 2017-07-23 MED ORDER — GABAPENTIN 400 MG PO CAPS
1200.0000 mg | ORAL_CAPSULE | Freq: Three times a day (TID) | ORAL | Status: DC
  Administered 2017-07-23 – 2017-07-24 (×3): 1200 mg via ORAL
  Filled 2017-07-23 (×3): qty 3

## 2017-07-23 MED ORDER — BISACODYL 10 MG RE SUPP: 10.0000 mg | Freq: Every day | RECTAL | Status: DC | PRN

## 2017-07-23 MED ORDER — PROCHLORPERAZINE EDISYLATE 5 MG/ML IJ SOLN: 5.0000 mg | Freq: Four times a day (QID) | INTRAMUSCULAR | Status: DC | PRN

## 2017-07-23 MED ORDER — GLIMEPIRIDE 2 MG PO TABS
4.0000 mg | ORAL_TABLET | Freq: Every day | ORAL | Status: DC
  Administered 2017-07-23 – 2017-08-02 (×11): 4 mg via ORAL
  Filled 2017-07-23 (×11): qty 2

## 2017-07-23 MED ORDER — TRAZODONE HCL 50 MG PO TABS: 25.0000 mg | ORAL_TABLET | Freq: Every evening | ORAL | Status: DC | PRN

## 2017-07-23 MED ORDER — DIPHENHYDRAMINE HCL 12.5 MG/5ML PO ELIX
12.5000 mg | ORAL_SOLUTION | Freq: Four times a day (QID) | ORAL | Status: DC | PRN
Start: 2017-07-23 — End: 2017-08-02

## 2017-07-23 MED ORDER — FLEET ENEMA 7-19 GM/118ML RE ENEM: 1.0000 | ENEMA | Freq: Once | RECTAL | Status: DC | PRN

## 2017-07-23 MED ORDER — MUSCLE RUB 10-15 % EX CREA
1.0000 "application " | TOPICAL_CREAM | Freq: Two times a day (BID) | CUTANEOUS | Status: DC
  Administered 2017-07-23 – 2017-08-01 (×17): 1 via TOPICAL
  Filled 2017-07-23 (×2): qty 85

## 2017-07-23 MED ORDER — ATORVASTATIN CALCIUM 40 MG PO TABS
40.0000 mg | ORAL_TABLET | Freq: Every day | ORAL | Status: DC
  Administered 2017-07-23 – 2017-08-01 (×10): 40 mg via ORAL
  Filled 2017-07-23 (×10): qty 1

## 2017-07-23 MED ORDER — POLYETHYLENE GLYCOL 3350 17 G PO PACK
17.0000 g | PACK | Freq: Every day | ORAL | Status: DC | PRN
  Filled 2017-07-23: qty 1

## 2017-07-23 MED ORDER — INSULIN GLARGINE 100 UNIT/ML ~~LOC~~ SOLN
15.0000 [IU] | Freq: Every day | SUBCUTANEOUS | Status: DC
  Administered 2017-07-23 – 2017-07-27 (×5): 15 [IU] via SUBCUTANEOUS
  Filled 2017-07-23 (×5): qty 0.15

## 2017-07-23 MED ORDER — TROLAMINE SALICYLATE 10 % EX CREA
TOPICAL_CREAM | Freq: Two times a day (BID) | CUTANEOUS | Status: DC
  Filled 2017-07-23: qty 85

## 2017-07-23 MED ORDER — ACETAMINOPHEN 325 MG PO TABS
325.0000 mg | ORAL_TABLET | ORAL | Status: DC | PRN
  Administered 2017-07-29 – 2017-08-01 (×4): 650 mg via ORAL
  Filled 2017-07-23 (×4): qty 2

## 2017-07-23 MED ORDER — INSULIN ASPART 100 UNIT/ML ~~LOC~~ SOLN
0.0000 [IU] | Freq: Every day | SUBCUTANEOUS | Status: DC
  Administered 2017-07-26: 3 [IU] via SUBCUTANEOUS
  Administered 2017-07-28: 2 [IU] via SUBCUTANEOUS

## 2017-07-23 MED ORDER — NICOTINE POLACRILEX 2 MG MT GUM
2.0000 mg | CHEWING_GUM | OROMUCOSAL | Status: DC | PRN
  Administered 2017-07-29 – 2017-07-30 (×2): 2 mg via ORAL
  Filled 2017-07-23 (×2): qty 1

## 2017-07-23 MED ORDER — PROCHLORPERAZINE 25 MG RE SUPP: 12.5000 mg | Freq: Four times a day (QID) | RECTAL | Status: DC | PRN

## 2017-07-23 MED ORDER — INSULIN ASPART 100 UNIT/ML ~~LOC~~ SOLN
0.0000 [IU] | Freq: Three times a day (TID) | SUBCUTANEOUS | Status: DC
  Administered 2017-07-24: 2 [IU] via SUBCUTANEOUS
  Administered 2017-07-26: 1 [IU] via SUBCUTANEOUS
  Administered 2017-07-27 (×2): 2 [IU] via SUBCUTANEOUS
  Administered 2017-07-27 – 2017-07-28 (×2): 3 [IU] via SUBCUTANEOUS
  Administered 2017-07-28 (×2): 2 [IU] via SUBCUTANEOUS
  Administered 2017-07-29: 1 [IU] via SUBCUTANEOUS
  Administered 2017-07-29: 5 [IU] via SUBCUTANEOUS
  Administered 2017-07-30: 1 [IU] via SUBCUTANEOUS
  Administered 2017-07-30: 2 [IU] via SUBCUTANEOUS
  Administered 2017-07-30: 7 [IU] via SUBCUTANEOUS
  Administered 2017-07-31: 2 [IU] via SUBCUTANEOUS
  Administered 2017-07-31: 3 [IU] via SUBCUTANEOUS
  Administered 2017-07-31: 2 [IU] via SUBCUTANEOUS
  Administered 2017-08-01: 5 [IU] via SUBCUTANEOUS
  Administered 2017-08-02: 1 [IU] via SUBCUTANEOUS
  Administered 2017-08-02: 2 [IU] via SUBCUTANEOUS

## 2017-07-23 MED ORDER — ENOXAPARIN SODIUM 40 MG/0.4ML ~~LOC~~ SOLN
40.0000 mg | SUBCUTANEOUS | Status: DC
  Administered 2017-07-23 – 2017-08-02 (×11): 40 mg via SUBCUTANEOUS
  Filled 2017-07-23 (×11): qty 0.4

## 2017-07-23 MED ORDER — CLOPIDOGREL BISULFATE 75 MG PO TABS
75.0000 mg | ORAL_TABLET | Freq: Every day | ORAL | Status: DC
  Administered 2017-07-24 – 2017-08-02 (×10): 75 mg via ORAL
  Filled 2017-07-23 (×10): qty 1

## 2017-07-23 MED ORDER — GUAIFENESIN-DM 100-10 MG/5ML PO SYRP: 5.0000 mL | ORAL_SOLUTION | Freq: Four times a day (QID) | ORAL | Status: DC | PRN

## 2017-07-23 MED ORDER — TRAMADOL HCL 50 MG PO TABS
50.0000 mg | ORAL_TABLET | Freq: Four times a day (QID) | ORAL | Status: DC | PRN
  Administered 2017-07-23 – 2017-07-30 (×10): 50 mg via ORAL
  Filled 2017-07-23 (×11): qty 1

## 2017-07-23 MED ORDER — CLONAZEPAM 0.5 MG PO TABS
0.5000 mg | ORAL_TABLET | Freq: Three times a day (TID) | ORAL | Status: DC
  Administered 2017-07-23 – 2017-08-02 (×28): 0.5 mg via ORAL
  Filled 2017-07-23 (×30): qty 1

## 2017-07-23 NOTE — Progress Notes (Addendum)
MEDICATION RELATED NOTE   Pharmacy Re:  Gabapentin  Assessment: Patient ordered 1200mg  Gabapentin tid (max dose).  Mild renal impairment with crcl < 57ml/min.  See below for recommended renal dosing.     Rober Minion, PharmD., MS Clinical Pharmacist Pager:  [phone] Thank you for allowing pharmacy to be part of this patients care team. 07/23/2017,2:49 PM

## 2017-07-23 NOTE — Progress Notes (Signed)
Tara Staggers, MD Physician Signed Physical Medicine and Rehabilitation  Consult Note Date of Service: 07/22/2017 7:56 AM  Related encounter: ED to Hosp-Admission (Discharged) from 07/20/2017 in [place] All Collapse All   [] Hide copied text [] Hover for attribution information      Physical Medicine and Rehabilitation Consult  Reason for Consult: Stroke with worsening of left sided weakness Referring Physician: Dr. Tana Coast.    HPI: Tara Hopkins is a 66 y.o. female with history of HTN, T2DM with peripheral neuropathy, bipolar disorder, polysubstance abuse, CVA with residual L-HP--CIR stay, who was admitted on 07/21/17 with worsening of left sided weakness. MRI Tara Hopkins revealed acute/subacute infarct in right posterior limb of internal capsule and moderate parenchymal volume loss of Tara Hopkins with moderate SVD. MRA Tara Hopkins/neck showed intracranial stenosis affecting left PCA with high grade stenosis P2 branches, 40% stenosis L-ICA, 60% stensis L-SA and bilateral high grade vertebral origin stenosis. 2D echo showed EF 55-60% with no wall abnormality and grade 2 diastolic dysfunction. UDS positive for THC and patient admitted to using cocaine 3-4 weeks ago. Neurology recommended changing ASA to plavix and starting high intensity statin. PT evaluation done yesterday and CIR recommended due to deficits in mobility.    Review of Systems  Constitutional: Negative for chills and fever.  HENT: Negative for hearing loss and tinnitus.   Eyes: Negative for blurred vision and double vision.  Respiratory: Negative for cough, sputum production and shortness of breath.   Cardiovascular: Negative for chest pain and palpitations.  Gastrointestinal: Positive for constipation. Negative for heartburn and nausea.  Genitourinary: Negative for dysuria and urgency.  Musculoskeletal: Negative for back pain, myalgias and neck pain.  Skin: Negative for itching  and rash.  Neurological: Positive for tingling, sensory change and focal weakness. Negative for dizziness and headaches.  Psychiatric/Behavioral: The patient is not nervous/anxious and does not have insomnia.           Past Medical History:  Diagnosis Date  . Anemia   . Anxiety   . Bipolar 1 disorder ([place])   . Cataracts, bilateral   . Depression   . Diabetes mellitus without complication ([place])   . Glaucoma   . History of blood transfusion   . History of hyperbaric oxygen therapy    pt reports 80 treatments.  . Hypercholesteremia   . Hypertension   . Peripheral neuropathy   . Stroke [facility]) 02/2015         Past Surgical History:  Procedure Laterality Date  . AMPUTATION Right 12/15/2013   Procedure: Right Transmetatarsal Amputation;  Surgeon: Newt Minion, MD;  Location: [place];  Service: Orthopedics;  Laterality: Right;  . AMPUTATION Right 02/23/2014   Procedure: AMPUTATION FOOT;  Surgeon: Newt Minion, MD;  Location: [place];  Service: Orthopedics;  Laterality: Right;  Right Foot Revision Transmetatarsal Amputation  . I&D EXTREMITY Right 05/23/2013   Procedure: IRRIGATION AND DEBRIDEMENT RIGHT GREAT TOE;  Surgeon: Mauri Pole, MD;  Location: [facility];  Service: Orthopedics;  Laterality: Right;         Family History  Problem Relation Age of Onset  . Liver cancer Mother   . Lung cancer Mother   . Depression Mother   . Hypertension Father   . Depression Father   . Colon cancer Neg Hx     Social History:  Lives alone--independent PTA. Ex husband assists with groceries.  Disabled. Uses cane/wheelfchair in the home and walker out of  home. She reports that she has been smoking Cigarettes--has cut down to 2 cigarettes daily.  She has never used smokeless tobacco. She reports that she drinks alcohol. She reports that she uses marijuana to help with neuropathy.     Allergies: No Known Allergies          Medications Prior to Admission    Medication Sig Dispense Refill  . clonazePAM (KLONOPIN) 0.5 MG tablet take 1 tablet by mouth three times a day 90 tablet 0  . gabapentin (NEURONTIN) 400 MG capsule Take 3 capsules (1,200 mg total) by mouth 3 (three) times daily. 270 capsule 5  . glimepiride (AMARYL) 4 MG tablet take 1 tablet once daily 90 tablet 3  . Insulin Glargine (BASAGLAR KWIKPEN) 100 UNIT/ML SOPN Inject 0.38 mLs (38 Units total) into the skin at bedtime. 9 mL 6  . lisinopril (PRINIVIL,ZESTRIL) 20 MG tablet Take 1 tablet (20 mg total) by mouth daily. 90 tablet 3  . simvastatin (ZOCOR) 40 MG tablet Take 1 tablet (40 mg total) by mouth at bedtime. 90 tablet 1  . aspirin EC 325 MG EC tablet Take 1 tablet (325 mg total) by mouth daily. 30 tablet 0  . COD LIVER OIL PO Take 1 capsule by mouth daily.    Marland Kitchen glucose blood test strip Test blood sugars 3 times daily Dx: E11.42 100 each 12  . Insulin Pen Needle (B-D UF III MINI PEN NEEDLES) 31G X 5 MM MISC Use as directed. 100 each 3  . lamoTRIgine (LAMICTAL) 100 MG tablet take 1/2 tablet by mouth twice a day (Patient not taking: Reported on 07/21/2017) 30 tablet 5  . ONE TOUCH LANCETS MISC USE TO CHECK BLOOD SUGAR 3 TIMES DAILY 200 each 3    Home: Home Living Family/patient expects to be discharged to:: Private residence (to her god daughter's home) Living Arrangements: Other (Comment) (god daughter's help) Available Help at Discharge: Family Type of Home: House (patients) Home Access: Stairs to enter Technical brewer of Steps: 2 (pt's home) Entrance Stairs-Rails: Right, Left Home Layout: One level Bathroom Shower/Tub: Chiropodist: Standard Home Equipment: Walker - 2 wheels, Bedside commode, Tub bench Additional Comments: information is for pt's home.  Functional History: Prior Function Level of Independence: Independent with assistive device(s) Comments: husband (separated) takes her to get groceries. Functional Status:  Mobility: Bed  Mobility Overal bed mobility: Needs Assistance Bed Mobility: Rolling, Sidelying to Sit, Sit to Sidelying Rolling: Min guard Sidelying to sit: Min assist Sit to sidelying: Mod assist General bed mobility comments: pt used rail to roll R and assist coming up from R. Pt given some truncal support. Transfers Overall transfer level: Needs assistance Transfers: Sit to/from Stand Sit to Stand: Min assist General transfer comment: assisted pt coming forward and for stability.  Activity limited to standing. pt with difficulty moving for feet to takes steps up toward [facility]. Ambulation/Gait General Gait Details: Not tested, attempted sidestepping with difficulty and aborted in lieu of sitting down further up in the bed.  ADL:  Cognition: Cognition Overall Cognitive Status: Within Functional Limits for tasks assessed Orientation Level: Oriented X4, Oriented to person, Oriented to place, Oriented to time, Oriented to situation Cognition Arousal/Alertness: Awake/alert Behavior During Therapy: [facility] for tasks assessed/performed Overall Cognitive Status: Within Functional Limits for tasks assessed  Blood pressure 126/67, pulse (!) 54, temperature 98 F (36.7 C), temperature source Oral, resp. rate 18, height 5\' 5"  (1.651 m), weight 84.1 kg (185 lb 6.5 oz), SpO2 98 %. Physical  Exam  Nursing note and vitals reviewed. Constitutional: She is oriented to person, place, and time. She appears well-developed and well-nourished.  Obese female in NAD.   HENT:  Head: Normocephalic and atraumatic.  Eyes: Pupils are equal, round, and reactive to light. Conjunctivae are normal.  Neck: Normal range of motion. Neck supple.  Cardiovascular: Normal rate and regular rhythm.   Respiratory: Effort normal and breath sounds normal. No stridor.  GI: Soft. Bowel sounds are normal. She exhibits no distension. There is no tenderness.  Musculoskeletal: She exhibits no edema or tenderness.  Right foot with transmetatarsal  amputation.    Neurological: She is alert and oriented to person, place, and time.  Mild dysarthria. Able to follow commands without difficulty. Decrease in fine motor movements LUE. LUE 3 to 3+/5. LLE 3+/5. Decreased balance to left. Fair insight  Skin: Skin is warm and dry. No rash noted. No erythema.  Psychiatric: She has a normal mood and affect. Her behavior is normal. Thought content normal.    Lab Results Last 24 Hours       Results for orders placed or performed during the hospital encounter of 07/20/17 (from the past 24 hour(s))  HIV antibody (Routine Testing)     Status: None   Collection Time: 07/21/17  8:22 AM  Result Value Ref Range   HIV Screen 4th Generation wRfx Non Reactive Non Reactive  Hemoglobin A1c     Status: Abnormal   Collection Time: 07/21/17  8:23 AM  Result Value Ref Range   Hgb A1c MFr Bld 9.2 (H) 4.8 - 5.6 %   Mean Plasma Glucose 217.34 mg/dL  CBG monitoring, ED     Status: Abnormal   Collection Time: 07/21/17  9:03 AM  Result Value Ref Range   Glucose-Capillary 53 (L) 65 - 99 mg/dL  CBG monitoring, ED     Status: Abnormal   Collection Time: 07/21/17  9:22 AM  Result Value Ref Range   Glucose-Capillary 61 (L) 65 - 99 mg/dL   Comment 1 Notify RN    Comment 2 Document in Chart   CBG monitoring, ED     Status: Abnormal   Collection Time: 07/21/17 10:30 AM  Result Value Ref Range   Glucose-Capillary 206 (H) 65 - 99 mg/dL   Comment 1 Notify RN    Comment 2 Document in Chart   CBG monitoring, ED     Status: Abnormal   Collection Time: 07/21/17  1:13 PM  Result Value Ref Range   Glucose-Capillary 170 (H) 65 - 99 mg/dL   Comment 1 Notify RN    Comment 2 Document in Chart   Glucose, capillary     Status: Abnormal   Collection Time: 07/21/17  4:18 PM  Result Value Ref Range   Glucose-Capillary 143 (H) 65 - 99 mg/dL   Comment 1 Notify RN    Comment 2 Document in Chart   Glucose, capillary     Status: Abnormal    Collection Time: 07/21/17  9:45 PM  Result Value Ref Range   Glucose-Capillary 120 (H) 65 - 99 mg/dL   Comment 1 Notify RN    Comment 2 Document in Chart   CBC     Status: None   Collection Time: 07/22/17  4:56 AM  Result Value Ref Range   WBC 8.7 4.0 - 10.5 K/uL   RBC 4.38 3.87 - 5.11 MIL/uL   Hemoglobin 12.0 12.0 - 15.0 g/dL   HCT 39.1 36.0 - 46.0 %   MCV  89.3 78.0 - 100.0 fL   MCH 27.4 26.0 - 34.0 pg   MCHC 30.7 30.0 - 36.0 g/dL   RDW 15.5 11.5 - 15.5 %   Platelets 317 150 - 400 K/uL  Basic metabolic panel     Status: Abnormal   Collection Time: 07/22/17  4:56 AM  Result Value Ref Range   Sodium 141 135 - 145 mmol/L   Potassium 3.9 3.5 - 5.1 mmol/L   Chloride 109 101 - 111 mmol/L   CO2 27 22 - 32 mmol/L   Glucose, Bld 72 65 - 99 mg/dL   BUN 15 6 - 20 mg/dL   Creatinine, Ser 1.39 (H) 0.44 - 1.00 mg/dL   Calcium 8.8 (L) 8.9 - 10.3 mg/dL   GFR calc non Af Amer 39 (L) >60 mL/min   GFR calc Af Amer 45 (L) >60 mL/min   Anion gap 5 5 - 15  Lipid panel     Status: Abnormal   Collection Time: 07/22/17  4:56 AM  Result Value Ref Range   Cholesterol 166 0 - 200 mg/dL   Triglycerides 191 (H) <150 mg/dL   HDL 41 >40 mg/dL   Total CHOL/HDL Ratio 4.0 RATIO   VLDL 38 0 - 40 mg/dL   LDL Cholesterol 87 0 - 99 mg/dL      Imaging Results (Last 48 hours)  Ct Head Wo Contrast  Result Date: 07/20/2017 CLINICAL DATA:  Focal neurologic deficit for greater than 6 hours, stroke suspected. Left-sided weakness. EXAM: CT HEAD WITHOUT CONTRAST TECHNIQUE: Contiguous axial images were obtained from the base of the skull through the vertex without intravenous contrast. COMPARISON:  Head CT 07/17/2015 FINDINGS: Tara Hopkins: No acute hemorrhage. No evidence of acute territorial ischemia. Generalized atrophy and chronic small vessel ischemic changes, stable from prior. Remote lacunar infarct in the periventricular white matter, unchanged. No hydrocephalus, midline shift or  mass effect. Vascular: Atherosclerosis of skullbase vasculature without hyperdense vessel or abnormal calcification. Skull: No skull fracture or focal lesion. Sinuses/Orbits: Frontal sinuses and left mastoid air cells are hypoplastic. No acute finding. No inflammatory change. Bilateral cataract resection. Other: None. IMPRESSION: 1. No acute intracranial abnormality. No evidence of acute ischemia on noncontrast head CT. 2. Stable atrophy, chronic small vessel ischemia and remote lacunar infarct. Electronically Signed   By: Jeb Levering M.D.   On: 07/20/2017 23:58   Mr Tara Hopkins Neck W Wo Contrast  Result Date: 07/21/2017 CLINICAL DATA:  Stroke workup. EXAM: MRA NECK WITHOUT AND WITH CONTRAST MRA HEAD WITHOUT CONTRAST TECHNIQUE: Multiplanar and multiecho pulse sequences of the neck were obtained without and with intravenous contrast. Angiographic images of the neck were obtained using MRA technique without and with intravenous contast.; Angiographic images of the Circle of Willis were obtained using MRA technique without intravenous contrast. CONTRAST:  25mL MULTIHANCE GADOBENATE DIMEGLUMINE 529 MG/ML IV SOLN COMPARISON:  Tara Hopkins MRI from earlier today FINDINGS: MRA NECK FINDINGS The arch has a normal diameter. Time-of-flight shows antegrade flow in both carotid and vertebral circulations. There is a 3 vessel arch branching. Proximal common carotid on the right is not well visualized due to artifact. The brachiocephalic is widely patent. Some atheromatous changes at the right common carotid bifurcation without flow limiting stenosis or ulceration. No indication of dissection. The left common carotid is diffusely patent. Left ICA origin stenosis measuring 40%. Negative for beading or dissection. Bilateral vertebral artery origin stenosis with bilateral flow gap. There is a stenosis of the proximal left subclavian artery which is difficult to  quantify due to scan limitations, but at minimum there is 60% narrowing.  Other than the V1 segments, the vertebral arteries are smooth and diffusely patent. Incidental goiter. MRA HEAD FINDINGS Strongly dominant right vertebral artery. Most of the left vertebral artery flow goes to a large PICA. Dominant right AICA. The vertebral and basilar arteries are smooth and widely patent. Atheromatous irregularity of the left more than right PCA, with a high-grade stenoses of both left P2 branches. Symmetric carotid arteries and branching. There is a low moderate right proximal M1 segment stenosis, considered atheromatous. Negative for aneurysm, branch occlusion, or generalized beading. IMPRESSION: Intracranial MRA: 1. No acute finding. 2. Intracranial atherosclerosis most significantly affecting the left PCA where there are high-grade P2 branch stenoses. 3. Moderate right proximal M1 segment stenosis. Neck MRA: 1. ~40% left ICA origin stenosis. 2. At least 60% proximal left subclavian stenosis. 3. Bilateral high-grade vertebral origins stenoses with flow gaps. 4. Goiter. Electronically Signed   By: Monte Fantasia M.D.   On: 07/21/2017 10:56   Mr Tara Hopkins Wo Contrast  Result Date: 07/21/2017 CLINICAL DATA:  66 y/o F; increase left extremity weakness and slurred speech. History of stroke with left-sided deficits. EXAM: MRI HEAD WITHOUT CONTRAST TECHNIQUE: Multiplanar, multiecho pulse sequences of the Tara Hopkins and surrounding structures were obtained without intravenous contrast. COMPARISON:  07/20/2017 CT of the head FINDINGS: Tara Hopkins: Subcentimeter focus of reduced diffusion in the right posterior limb of internal capsule. Mild associated T2 hyperintensity. No acute hemorrhage. Moderate chronic microvascular ischemic changes and mild Tara Hopkins parenchymal volume loss. Chronic lacunar infarcts within the right paramedian pons, bilateral thalamus, left mid corona radiata, and right posterior corona radiata. No abnormal susceptibility hypointensity to indicate intracranial hemorrhage. No hydrocephalus or  effacement of basilar cisterns. No focal mass effect. Vascular:  Normal flow voids. Skull and upper cervical spine: Normal marrow signal. Sinuses/Orbits: Negative. Other: None. IMPRESSION: 1. Subcentimeter acute/early subacute infarction in right posterior limb of internal capsule. No acute hemorrhage. 2. Moderate chronic microvascular ischemic changes and moderate parenchymal volume loss of the Tara Hopkins. Chronic lacunar infarcts of basal ganglia and pons. These results will be called to the ordering clinician or representative by the Radiologist Assistant, and communication documented in the PACS or zVision Dashboard. Electronically Signed   By: Kristine Garbe M.D.   On: 07/21/2017 04:59   Mr Tara Hopkins Head Wo Contrast  Result Date: 07/21/2017 CLINICAL DATA:  Stroke workup. EXAM: MRA NECK WITHOUT AND WITH CONTRAST MRA HEAD WITHOUT CONTRAST TECHNIQUE: Multiplanar and multiecho pulse sequences of the neck were obtained without and with intravenous contrast. Angiographic images of the neck were obtained using MRA technique without and with intravenous contast.; Angiographic images of the Circle of Willis were obtained using MRA technique without intravenous contrast. CONTRAST:  70mL MULTIHANCE GADOBENATE DIMEGLUMINE 529 MG/ML IV SOLN COMPARISON:  Tara Hopkins MRI from earlier today FINDINGS: MRA NECK FINDINGS The arch has a normal diameter. Time-of-flight shows antegrade flow in both carotid and vertebral circulations. There is a 3 vessel arch branching. Proximal common carotid on the right is not well visualized due to artifact. The brachiocephalic is widely patent. Some atheromatous changes at the right common carotid bifurcation without flow limiting stenosis or ulceration. No indication of dissection. The left common carotid is diffusely patent. Left ICA origin stenosis measuring 40%. Negative for beading or dissection. Bilateral vertebral artery origin stenosis with bilateral flow gap. There is a stenosis of the  proximal left subclavian artery which is difficult to quantify due to scan limitations, but at  minimum there is 60% narrowing. Other than the V1 segments, the vertebral arteries are smooth and diffusely patent. Incidental goiter. MRA HEAD FINDINGS Strongly dominant right vertebral artery. Most of the left vertebral artery flow goes to a large PICA. Dominant right AICA. The vertebral and basilar arteries are smooth and widely patent. Atheromatous irregularity of the left more than right PCA, with a high-grade stenoses of both left P2 branches. Symmetric carotid arteries and branching. There is a low moderate right proximal M1 segment stenosis, considered atheromatous. Negative for aneurysm, branch occlusion, or generalized beading. IMPRESSION: Intracranial MRA: 1. No acute finding. 2. Intracranial atherosclerosis most significantly affecting the left PCA where there are high-grade P2 branch stenoses. 3. Moderate right proximal M1 segment stenosis. Neck MRA: 1. ~40% left ICA origin stenosis. 2. At least 60% proximal left subclavian stenosis. 3. Bilateral high-grade vertebral origins stenoses with flow gaps. 4. Goiter. Electronically Signed   By: Monte Fantasia M.D.   On: 07/21/2017 10:56     Assessment/Plan: Diagnosis: Right PLIC infarct. Hx of prior CVA 1. Does the need for close, 24 hr/day medical supervision in concert with the patient's rehab needs make it unreasonable for this patient to be served in a less intensive setting? Yes 2. Co-Morbidities requiring supervision/potential complications: polysubstance abuse, DM 2 with DPN, Htn 3. Due to bladder management, bowel management, safety, skin/wound care, disease management, medication administration, pain management and patient education, does the patient require 24 hr/day rehab nursing? Yes 4. Does the patient require coordinated care of a physician, rehab nurse, PT (1-2 hrs/day, 5 days/week), OT (1-2 hrs/day, 5 days/week) and SLP (1-2 hrs/day, 5  days/week) to address physical and functional deficits in the context of the above medical diagnosis(es)? Yes Addressing deficits in the following areas: balance, endurance, locomotion, strength, transferring, bowel/bladder control, bathing, dressing, feeding, grooming, toileting, cognition, speech and psychosocial support 5. Can the patient actively participate in an intensive therapy program of at least 3 hrs of therapy per day at least 5 days per week? Yes 6. The potential for patient to make measurable gains while on inpatient rehab is good 7. Anticipated functional outcomes upon discharge from inpatient rehab are modified independent  with PT, modified independent and supervision with OT, modified independent with SLP. 8. Estimated rehab length of stay to reach the above functional goals is: 13-17 days 9. Anticipated D/C setting: Home 10. Anticipated post D/C treatments: HH therapy and Outpatient therapy 11. Overall Rehab/Functional Prognosis: excellent  RECOMMENDATIONS: This patient's condition is appropriate for continued rehabilitative care in the following setting: CIR Patient has agreed to participate in recommended program. Yes Note that insurance prior authorization may be required for reimbursement for recommended care.  Comment: Rehab Admissions Coordinator to follow up.  Thanks,  Tara Staggers, MD, [place] Pillar, PA-C 07/22/2017    Revision History                        Routing History

## 2017-07-23 NOTE — Progress Notes (Signed)
Tara Hopkins Rehab Admission Coordinator Signed Physical Medicine and Rehabilitation  PMR Pre-admission Date of Service: 07/23/2017 9:14 AM  Related encounter: ED to Hosp-Admission (Discharged) from 07/20/2017 in [place]       [] Hide copied text PMR Admission Coordinator Pre-Admission Assessment  [name] is an 66 y.o., female MRN: [id number] DOB: 01-05-1951 Height: 5\' 5"  (165.1 cm) Weight: 84.1 kg (185 lb 6.5 oz)                                                                                                                                                  Insurance Information HMO:     PPO:      PCP:      IPA:      80/20:      OTHER:  PRIMARY: Medicare A & B      Policy#: [id number]      Subscriber: Self CM Name:       Phone#:      Fax#:  Pre-Cert#: Eligible       Employer: Retired/Disabled  Benefits:  Phone #: Verified Online     Name: [employer] Eff. Date: A: 04/10/15 B:10/10/15     Deduct: $1340      Out of Pocket Max: None      Life Max: None CIR: 100%      SNF: 100% days 1-20; 80% days 21-100 Outpatient: 80%     Co-Pay: 20% Home Health: 100%      Co-Pay: None DME: 80%     Co-Pay: 20% Providers: Patient's Choice   SECONDARY: [place]       Policy#: [id number]      Subscriber: Self  CM Name:       Phone#:      Fax#:  Pre-Cert#:       Employer:  Benefits:  Phone #:      Name:  Eff. Date:      Deduct:       Out of Pocket Max:       Life Max:  CIR:       SNF:  Outpatient:      Co-Pay:  Home Health:       Co-Pay:  DME:      Co-Pay:   Medicaid Application Date:       Case Manager:  Disability Application Date:       Case Worker:   Emergency Contact Information        Contact Information    Name Relation Home Work Mobile   Tara Hopkins Spouse [phone]       Current Medical History  Patient Admitting Diagnosis: Right PLIC infarct. 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 MRI brain revealed acute/subacute infarct in right posterior limb of internal capsule and moderate parenchymal volume loss of brain with moderate SVD. MRA brain/neck showed intracranial stenosis affecting left PCA with high grade stenosis P2 branches, 40% stenosis L-ICA, 60% stensis L-SA and bilateral high grade vertebral origin stenosis. 2D echo showed EF 55-60% with no wall abnormality and grade 2 diastolic dysfunction. UDS positive for THC and patient admitted to using cocaine 3-4 weeks ago. Neurology recommended changing ASA to plavix and starting high intensity statin. PT and OT evaluations done with recommendations for CIR due to new deficits in mobility and basic self-care tasks. Patient admitted to [facility] 07/23/17.    NIH Total: 2  Past Medical History      Past Medical History:  Diagnosis Date  . Anemia   . Anxiety   . Bipolar 1 disorder ([place])   . Cataracts, bilateral   . Depression   . Diabetes mellitus without complication ([place])   . Glaucoma   . History of blood transfusion   . History of hyperbaric oxygen therapy    pt reports 80 treatments.  . Hypercholesteremia   . Hypertension   . Peripheral neuropathy   . Stroke [facility]) 02/2015    Family History  family history includes Depression in her father and mother; Hypertension in her father; Liver cancer in her mother; Lung cancer in her mother.  Prior Rehab/Hospitalizations:  Has the patient had major surgery during 100 days prior to admission? No  Current Medications   Current Facility-Administered Medications:  .  0.9 %  sodium chloride infusion, , Intravenous, Continuous, Rosalin Hawking, MD, Last Rate: 50 mL/hr at 07/23/17 0720 .  acetaminophen (TYLENOL) tablet 650 mg,  650 mg, Oral, Q6H PRN, 650 mg at 07/22/17 2015 **OR** acetaminophen (TYLENOL) suppository 650 mg, 650 mg, Rectal, Q6H PRN, Waldemar Dickens, MD .  atorvastatin (LIPITOR) tablet 40 mg, 40 mg, Oral, q1800, Waldemar Dickens, MD, 40 mg at 07/22/17 1709 .  clonazePAM (KLONOPIN) tablet 0.5 mg, 0.5 mg, Oral, TID, Waldemar Dickens, MD, 0.5 mg at 07/23/17 0630 .  clopidogrel (PLAVIX) tablet 75 mg, 75 mg, Oral, Daily, Rosalin Hawking, MD, 75 mg at 07/23/17 0823 .  gabapentin (NEURONTIN) capsule 1,200 mg, 1,200 mg, Oral, TID, Waldemar Dickens, MD, 1,200 mg at 07/23/17 0823 .  heparin injection 5,000 Units, 5,000 Units, Subcutaneous, Q8H, Waldemar Dickens, MD, 5,000 Units at 07/23/17 0541 .  hydrALAZINE (APRESOLINE) injection 5-10 mg, 5-10 mg, Intravenous, Q4H PRN, Waldemar Dickens, MD .  insulin aspart (novoLOG) injection 0-5 Units, 0-5 Units, Subcutaneous, QHS, Waldemar Dickens, MD, Stopped at 07/22/17 2200 .  insulin aspart (novoLOG) injection 0-9 Units, 0-9 Units, Subcutaneous, TID WC, Waldemar Dickens, MD, 1 Units at 07/23/17 [phone] .  insulin glargine (LANTUS) injection 15 Units, 15 Units, Subcutaneous, QHS, Rai, Ripudeep K, MD, 15 Units at 07/22/17 2107 .  nicotine polacrilex (NICORETTE) gum 2 mg, 2 mg, Oral, PRN, Waldemar Dickens, MD .  ondansetron [facility]) tablet 4 mg, 4 mg, Oral, Q6H PRN **OR** ondansetron (ZOFRAN) injection 4 mg, 4 mg, Intravenous, Q6H PRN, Waldemar Dickens, MD .  traMADol Veatrice Bourbon) tablet 50-100 mg, 50-100 mg, Oral, Q6H PRN, Opyd, Ilene Qua, MD, 100 mg at 07/23/17 0932  Patients Current Diet: Diet heart healthy/carb modified Room service appropriate? Yes; Fluid consistency: Thin  Precautions / Restrictions Precautions Precautions: Fall Restrictions Weight Bearing  Restrictions: No   Has the patient had 2 or more falls or a fall with injury in the past year?Yes  Prior Activity Level Limited Community (1-2x/wk): Prior to [employer] patient lived alone, did not drive, but reports  going out several times a week with family and friends.  She has been in IP Rehab before and is eager to regain her independence like last time.  Home Assistive Devices / Equipment Home Assistive Devices/Equipment: None Home Equipment: Walker - 2 wheels, Bedside commode, Tub bench  Prior Device Use: Indicate devices/aids used by the patient prior to current illness, exacerbation or injury? Manual wheelchair, Walker and Single point cane  Prior Functional Level Prior Function Level of Independence: Independent with assistive device(s) Comments: husband (separated) takes her to get groceries.  Self Care: Did the patient need help bathing, dressing, using the toilet or eating? Independent  Indoor Mobility: Did the patient need assistance with walking from room to room (with or without device)? Independent  Stairs: Did the patient need assistance with internal or external stairs (with or without device)? Independent  Functional Cognition: Did the patient need help planning regular tasks such as shopping or remembering to take medications? Independent  Current Functional Level Cognition  Overall Cognitive Status: Within Functional Limits for tasks assessed Orientation Level: Oriented to person, Oriented to place, Oriented to situation, Disoriented to time General Comments: will further assess cognition    Extremity Assessment (includes Sensation/Coordination)  Upper Extremity Assessment: RUE deficits/detail RUE Deficits / Details: Attempting ot use . Moving out of synergy pattern/isolated movment. Brunstrom level IV/V. Difficulty with fine motor control RUE Coordination: decreased fine motor, decreased gross motor  Lower Extremity Assessment: Defer to PT evaluation RLE Deficits / Details: midfoot amputaon RLE Coordination: decreased fine motor LLE Deficits / Details: weaker LLE which she is reliant on dueto midfoot ampuation on R LLE Coordination: decreased fine motor      ADLs  Overall ADL's : Needs assistance/impaired Eating/Feeding: Modified independent Grooming: Supervision/safety, Set up, Sitting Upper Body Bathing: Minimal assistance, Sitting Lower Body Bathing: Moderate assistance, Sit to/from stand Upper Body Dressing : Minimal assistance, Sitting Lower Body Dressing: Moderate assistance, Sit to/from stand Toilet Transfer: Moderate assistance, Squat-pivot, BSC Toileting- Clothing Manipulation and Hygiene: Moderate assistance, Sit to/from stand Functional mobility during ADLs: Moderate assistance, Rolling walker, Cueing for safety General ADL Comments: limited mobility due to weakness    Mobility  Overal bed mobility: Needs Assistance Bed Mobility: Rolling, Sidelying to Sit, Sit to Sidelying Rolling: Min guard Sidelying to sit: Min assist Sit to sidelying: Mod assist General bed mobility comments: OOB on BSC    Transfers  Overall transfer level: Needs assistance Equipment used: Rolling walker (2 wheeled) Transfers: Sit to/from Stand, Stand Pivot Transfers Sit to Stand: Min assist Stand pivot transfers: Mod assist General transfer comment: assisted pt coming forward and for stability.  Activity limited to standing. pt with difficulty moving for feet to takes steps up toward [facility].    Ambulation / Gait / Stairs / Wheelchair Mobility  Ambulation/Gait General Gait Details: Not tested, attempted sidestepping with difficulty and aborted in lieu of sitting down further up in the bed.    Posture / Balance Balance Overall balance assessment: Needs assistance Sitting-balance support: No upper extremity supported Sitting balance-Leahy Scale: Fair Standing balance support: Single extremity supported Standing balance-Leahy Scale: Poor Standing balance comment: needed stationary object or external support    Special needs/care consideration BiPAP/CPAP: No CPM: No Continuous Drip IV: No Dialysis: No  Life Vest: No Oxygen:  No Special Bed: Low bed protocol for Plavix  Trach Size: No Wound Vac (area): No       Skin: WDL, dry                                Bowel mgmt: Continent, last BM 07/22/17 Bladder mgmt: Mostly continent with some urgency and frequency; she wore depends prior to admission Diabetic mgmt: [id number], reports checking her sugars and taking sliding scale insulin at home prior to admission      Previous Home Environment Living Arrangements: Other (Comment) (god daughter's help) Available Help at Discharge: Family Type of Home: House (patients) Home Layout: One level Home Access: Stairs to enter Entrance Stairs-Rails: Right, Left Entrance Stairs-Number of Steps: 2 (pt's home) Bathroom Shower/Tub: Chiropodist: Standard Home Care Services: No Additional Comments: information is for pt's home.  Discharge Living Setting Plans for Discharge Living Setting: Patient's home, Apartment, Alone Type of Home at Discharge: Apartment Discharge Home Layout: One level Discharge Home Access: Level entry Discharge Bathroom Shower/Tub: Tub/shower unit Discharge Bathroom Toilet: Handicapped height Discharge Bathroom Accessibility: Yes How Accessible: Accessible via wheelchair, Accessible via walker Does the patient have any problems obtaining your medications?: No  Social/Family/Support Systems Patient Roles: Parent, Other (Comment) (Friend, God Mother ) Contact Information: Spouse (Seperated) Sherlonda Flater [phone] Anticipated Caregiver: Family and friends to assist as needed  Anticipated Caregiver's Contact Information: see above  Ability/Limitations of Caregiver: They live seperate and he is able to help PRN  Caregiver Availability: Intermittent Discharge Plan Discussed with Primary Caregiver: Yes Is Caregiver In Agreement with Plan?: Yes Does Caregiver/Family have Issues with Lodging/Transportation while Pt is in Rehab?: No  Goals/Additional Needs Patient/Family  Goal for Rehab: PT/SLP: Mod I; OT Supervision-Mod I Expected length of stay: 13-17 days  Cultural Considerations: [employer]  Dietary Needs: Carb. Mod. and Heart Healthy diet restrictions  Equipment Needs: TBD Special Service Needs: None Additional Information: Patient is being moved into her new ADA apartment as of 07/23/17 Pt/Family Agrees to Admission and willing to participate: Yes Program Orientation Provided & Reviewed with Pt/Caregiver Including Roles  & Responsibilities: Yes Additional Information Needs: None Information Needs to be Provided By: N/A  Decrease burden of Care through IP rehab admission: No  Possible need for SNF placement upon discharge: No  Patient Condition: This patient's condition remains as documented in the consult dated 07/22/17 at 10:42 AM, in which the Rehabilitation Physician determined and documented that the patient's condition is appropriate for intensive rehabilitative care in an inpatient rehabilitation facility. Will admit to inpatient rehab today.  Preadmission Screen Completed By:  Tara Hopkins, 07/23/2017 9:14 AM ______________________________________________________________________   Discussed status with Dr. Naaman Plummer on 07/23/17 at 67 and received telephone approval for admission today.  Admission Coordinator:  Tara Hopkins, time1010/Date 07/23/17       Cosigned by: Meredith Staggers, MD at 07/23/2017 10:41 AM  Revision History

## 2017-07-23 NOTE — Progress Notes (Signed)
Pt transferred to inpatient rehab room [id number]. RN gave report to Seth Bake, Therapist, sports. Pt aware of transfer.

## 2017-07-23 NOTE — PMR Pre-admission (Signed)
PMR Admission Coordinator Pre-Admission Assessment  [name] is an 66 y.o., female MRN: [id number] DOB: 1951-07-23 Height: 5\' 5"  (165.1 cm) Weight: 84.1 kg (185 lb 6.5 oz)              Insurance Information HMO:     PPO:      PCP:      IPA:      80/20:      OTHER:  PRIMARY: Medicare A & B      Policy#: [id number]      Subscriber: Self CM Name:       Phone#:      Fax#:  Pre-Cert#: Eligible       Employer: Retired/Disabled  Benefits:  Phone #: Verified Online     Name: [employer] Eff. Date: A: 04/10/15 B:10/10/15     Deduct: $1340      Out of Pocket Max: None      Life Max: None CIR: 100%      SNF: 100% days 1-20; 80% days 21-100 Outpatient: 80%     Co-Pay: 20% Home Health: 100%      Co-Pay: None DME: 80%     Co-Pay: 20% Providers: Patient's Choice   SECONDARY: [place]       Policy#: [id number]      Subscriber: Self  CM Name:       Phone#:      Fax#:  Pre-Cert#:       Employer:  Benefits:  Phone #:      Name:  Eff. Date:      Deduct:       Out of Pocket Max:       Life Max:  CIR:       SNF:  Outpatient:      Co-Pay:  Home Health:       Co-Pay:  DME:      Co-Pay:   Medicaid Application Date:       Case Manager:  Disability Application Date:       Case Worker:   Emergency Contact Information Contact Information    Name Relation Home Work Mobile   Bacigalupo,Peter Spouse [phone]       Current Medical History  Patient Admitting Diagnosis: Right PLIC infarct. Hx of prior CVA  History of Present Illness: Tara Hopkins is a 66 y.o. 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 MRI brain revealed acute/subacute infarct in right posterior limb of internal capsule and moderate parenchymal volume loss of brain with moderate SVD. MRA brain/neck showed intracranial stenosis  affecting left PCA with high grade stenosis P2 branches, 40% stenosis L-ICA, 60% stensis L-SA and bilateral high grade vertebral origin stenosis. 2D echo showed EF 55-60% with no wall abnormality and grade 2 diastolic dysfunction. UDS positive for THC and patient admitted to using cocaine 3-4 weeks ago. Neurology recommended changing ASA to plavix and starting high intensity statin. PT and OT evaluations done with recommendations for CIR due to new deficits in mobility and basic self-care tasks. Patient admitted to [facility] 07/23/17.    NIH Total: 2    Past Medical History  Past Medical History:  Diagnosis Date  . Anemia   . Anxiety   . Bipolar 1 disorder ([place])   . Cataracts, bilateral   . Depression   . Diabetes mellitus without complication ([place])   .  Glaucoma   . History of blood transfusion   . History of hyperbaric oxygen therapy    pt reports 80 treatments.  . Hypercholesteremia   . Hypertension   . Peripheral neuropathy   . Stroke [facility]) 02/2015    Family History  family history includes Depression in her father and mother; Hypertension in her father; Liver cancer in her mother; Lung cancer in her mother.  Prior Rehab/Hospitalizations:  Has the patient had major surgery during 100 days prior to admission? No  Current Medications   Current Facility-Administered Medications:  .  0.9 %  sodium chloride infusion, , Intravenous, Continuous, Rosalin Hawking, MD, Last Rate: 50 mL/hr at 07/23/17 0720 .  acetaminophen (TYLENOL) tablet 650 mg, 650 mg, Oral, Q6H PRN, 650 mg at 07/22/17 2015 **OR** acetaminophen (TYLENOL) suppository 650 mg, 650 mg, Rectal, Q6H PRN, Waldemar Dickens, MD .  atorvastatin (LIPITOR) tablet 40 mg, 40 mg, Oral, q1800, Waldemar Dickens, MD, 40 mg at 07/22/17 1709 .  clonazePAM (KLONOPIN) tablet 0.5 mg, 0.5 mg, Oral, TID, Waldemar Dickens, MD, 0.5 mg at 07/23/17 4098 .  clopidogrel (PLAVIX) tablet 75 mg, 75 mg, Oral, Daily, Rosalin Hawking, MD, 75 mg at 07/23/17 0823 .   gabapentin (NEURONTIN) capsule 1,200 mg, 1,200 mg, Oral, TID, Waldemar Dickens, MD, 1,200 mg at 07/23/17 0823 .  heparin injection 5,000 Units, 5,000 Units, Subcutaneous, Q8H, Waldemar Dickens, MD, 5,000 Units at 07/23/17 0541 .  hydrALAZINE (APRESOLINE) injection 5-10 mg, 5-10 mg, Intravenous, Q4H PRN, Waldemar Dickens, MD .  insulin aspart (novoLOG) injection 0-5 Units, 0-5 Units, Subcutaneous, QHS, Waldemar Dickens, MD, Stopped at 07/22/17 2200 .  insulin aspart (novoLOG) injection 0-9 Units, 0-9 Units, Subcutaneous, TID WC, Waldemar Dickens, MD, 1 Units at 07/23/17 [phone] .  insulin glargine (LANTUS) injection 15 Units, 15 Units, Subcutaneous, QHS, Rai, Ripudeep K, MD, 15 Units at 07/22/17 2107 .  nicotine polacrilex (NICORETTE) gum 2 mg, 2 mg, Oral, PRN, Waldemar Dickens, MD .  ondansetron [facility]) tablet 4 mg, 4 mg, Oral, Q6H PRN **OR** ondansetron (ZOFRAN) injection 4 mg, 4 mg, Intravenous, Q6H PRN, Waldemar Dickens, MD .  traMADol Veatrice Bourbon) tablet 50-100 mg, 50-100 mg, Oral, Q6H PRN, Opyd, Ilene Qua, MD, 100 mg at 07/23/17 4782  Patients Current Diet: Diet heart healthy/carb modified Room service appropriate? Yes; Fluid consistency: Thin  Precautions / Restrictions Precautions Precautions: Fall Restrictions Weight Bearing Restrictions: No   Has the patient had 2 or more falls or a fall with injury in the past year?Yes  Prior Activity Level Limited Community (1-2x/wk): Prior to [employer] patient lived alone, did not drive, but reports going out several times a week with family and friends.  She has been in IP Rehab before and is eager to regain her independence like last time.  Home Assistive Devices / Equipment Home Assistive Devices/Equipment: None Home Equipment: Walker - 2 wheels, Bedside commode, Tub bench  Prior Device Use: Indicate devices/aids used by the patient prior to current illness, exacerbation or injury? Manual wheelchair, Walker and Single point cane  Prior Functional  Level Prior Function Level of Independence: Independent with assistive device(s) Comments: husband (separated) takes her to get groceries.  Self Care: Did the patient need help bathing, dressing, using the toilet or eating? Independent  Indoor Mobility: Did the patient need assistance with walking from room to room (with or without device)? Independent  Stairs: Did the patient need assistance with internal or external stairs (with or without device)?  Independent  Functional Cognition: Did the patient need help planning regular tasks such as shopping or remembering to take medications? Independent  Current Functional Level Cognition  Overall Cognitive Status: Within Functional Limits for tasks assessed Orientation Level: Oriented to person, Oriented to place, Oriented to situation, Disoriented to time General Comments: will further assess cognition    Extremity Assessment (includes Sensation/Coordination)  Upper Extremity Assessment: RUE deficits/detail RUE Deficits / Details: Attempting ot use . Moving out of synergy pattern/isolated movment. Brunstrom level IV/V. Difficulty with fine motor control RUE Coordination: decreased fine motor, decreased gross motor  Lower Extremity Assessment: Defer to PT evaluation RLE Deficits / Details: midfoot amputaon RLE Coordination: decreased fine motor LLE Deficits / Details: weaker LLE which she is reliant on dueto midfoot ampuation on R LLE Coordination: decreased fine motor    ADLs  Overall ADL's : Needs assistance/impaired Eating/Feeding: Modified independent Grooming: Supervision/safety, Set up, Sitting Upper Body Bathing: Minimal assistance, Sitting Lower Body Bathing: Moderate assistance, Sit to/from stand Upper Body Dressing : Minimal assistance, Sitting Lower Body Dressing: Moderate assistance, Sit to/from stand Toilet Transfer: Moderate assistance, Squat-pivot, BSC Toileting- Clothing Manipulation and Hygiene: Moderate assistance,  Sit to/from stand Functional mobility during ADLs: Moderate assistance, Rolling walker, Cueing for safety General ADL Comments: limited mobility due to weakness    Mobility  Overal bed mobility: Needs Assistance Bed Mobility: Rolling, Sidelying to Sit, Sit to Sidelying Rolling: Min guard Sidelying to sit: Min assist Sit to sidelying: Mod assist General bed mobility comments: OOB on BSC    Transfers  Overall transfer level: Needs assistance Equipment used: Rolling walker (2 wheeled) Transfers: Sit to/from Stand, Stand Pivot Transfers Sit to Stand: Min assist Stand pivot transfers: Mod assist General transfer comment: assisted pt coming forward and for stability.  Activity limited to standing. pt with difficulty moving for feet to takes steps up toward [facility].    Ambulation / Gait / Stairs / Wheelchair Mobility  Ambulation/Gait General Gait Details: Not tested, attempted sidestepping with difficulty and aborted in lieu of sitting down further up in the bed.    Posture / Balance Balance Overall balance assessment: Needs assistance Sitting-balance support: No upper extremity supported Sitting balance-Leahy Scale: Fair Standing balance support: Single extremity supported Standing balance-Leahy Scale: Poor Standing balance comment: needed stationary object or external support    Special needs/care consideration BiPAP/CPAP: No CPM: No Continuous Drip IV: No Dialysis: No         Life Vest: No Oxygen: No Special Bed: Low bed protocol for Plavix  Trach Size: No Wound Vac (area): No       Skin: WDL, dry                                Bowel mgmt: Continent, last BM 07/22/17 Bladder mgmt: Mostly continent with some urgency and frequency; she wore depends prior to admission Diabetic mgmt: HgbA1c 9.2, reports checking her sugars and taking sliding scale insulin at home prior to admission      Previous Home Environment Living Arrangements: Other (Comment) (god daughter's help) Available  Help at Discharge: Family Type of Home: House (patients) Home Layout: One level Home Access: Stairs to enter Entrance Stairs-Rails: Right, Left Entrance Stairs-Number of Steps: 2 (pt's home) Bathroom Shower/Tub: Chiropodist: Standard Home Care Services: No Additional Comments: information is for pt's home.  Discharge Living Setting Plans for Discharge Living Setting: Patient's home, Apartment, Alone Type of Home at  Discharge: Apartment Discharge Home Layout: One level Discharge Home Access: Level entry Discharge Bathroom Shower/Tub: Tub/shower unit Discharge Bathroom Toilet: Handicapped height Discharge Bathroom Accessibility: Yes How Accessible: Accessible via wheelchair, Accessible via walker Does the patient have any problems obtaining your medications?: No  Social/Family/Support Systems Patient Roles: Parent, Other (Comment) (Friend, God Mother ) Contact Information: Spouse (Seperated) Yacine Droz [phone] Anticipated Caregiver: Family and friends to assist as needed  Anticipated Caregiver's Contact Information: see above  Ability/Limitations of Caregiver: They live seperate and he is able to help PRN  Caregiver Availability: Intermittent Discharge Plan Discussed with Primary Caregiver: Yes Is Caregiver In Agreement with Plan?: Yes Does Caregiver/Family have Issues with Lodging/Transportation while Pt is in Rehab?: No  Goals/Additional Needs Patient/Family Goal for Rehab: PT/SLP: Mod I; OT Supervision-Mod I Expected length of stay: 13-17 days  Cultural Considerations: Baptist  Dietary Needs: Carb. Mod. and Heart Healthy diet restrictions  Equipment Needs: TBD Special Service Needs: None Additional Information: Patient is being moved into her new ADA apartment as of 07/23/17 Pt/Family Agrees to Admission and willing to participate: Yes Program Orientation Provided & Reviewed with Pt/Caregiver Including Roles  & Responsibilities: Yes Additional  Information Needs: None Information Needs to be Provided By: N/A  Decrease burden of Care through IP rehab admission: No  Possible need for SNF placement upon discharge: No  Patient Condition: This patient's condition remains as documented in the consult dated 07/22/17 at 10:42 AM, in which the Rehabilitation Physician determined and documented that the patient's condition is appropriate for intensive rehabilitative care in an inpatient rehabilitation facility. Will admit to inpatient rehab today.  Preadmission Screen Completed By:  Gunnar Fusi, 07/23/2017 9:14 AM ______________________________________________________________________   Discussed status with Dr. Naaman Plummer on 07/23/17 at 66 and received telephone approval for admission today.  Admission Coordinator:  Gunnar Fusi, time1010/Date 07/23/17

## 2017-07-23 NOTE — H&P (Signed)
Physical Medicine and Rehabilitation Admission H&P    Chief Complaint  Patient presents with  . Stroke with worsening of left sided weakness    HPI: Tara Hopkins is a 66 y.o. female with history of HTN, T2DM with peripheral neuropathy, bipolar disorder, polysubstance abuse, CVA with residual L-HP--CIR stay, who was admitted on 07/21/17 with worsening of left sided weakness. MRI brain revealed acute/subacute infarct in right posterior limb of internal capsule and moderate parenchymal volume loss of brain with moderate SVD. MRA brain/neck showed intracranial stenosis affecting left PCA with high grade stenosis P2 branches, 40% stenosis L-ICA, 60% stensis L-SA and bilateral high grade vertebral origin stenosis. 2D echo showed EF 55-60% with no wall abnormality and grade 2 diastolic dysfunction. UDS positive for THC and patient admitted to using cocaine 3-4 weeks ago. Neurology recommended changing ASA to plavix and starting high intensity statin.  Therapy evaluations done yesterday and CIR recommended due to deficits in mobility.    Review of Systems  HENT: Negative for hearing loss and tinnitus.   Eyes: Negative for blurred vision and double vision.  Respiratory: Negative for cough and shortness of breath.   Cardiovascular: Negative for chest pain and palpitations.  Gastrointestinal: Negative for constipation, heartburn and nausea.  Genitourinary: Negative for dysuria and urgency.  Musculoskeletal: Positive for neck pain (slept wrong last night).  Skin: Negative for itching and rash.  Neurological: Positive for sensory change and focal weakness. Negative for dizziness and headaches.  Psychiatric/Behavioral: Positive for substance abuse (uses marijuana for neuropathy). The patient does not have insomnia.      Past Medical History:  Diagnosis Date  . Anemia   . Anxiety   . Bipolar 1 disorder ([place])   . Cataracts, bilateral   . Depression   . Diabetes mellitus without  complication ([place])   . Glaucoma   . History of blood transfusion   . History of hyperbaric oxygen therapy    pt reports 80 treatments.  . Hypercholesteremia   . Hypertension   . Peripheral neuropathy   . Stroke [facility]) 02/2015    Past Surgical History:  Procedure Laterality Date  . AMPUTATION Right 12/15/2013   Procedure: Right Transmetatarsal Amputation;  Surgeon: Newt Minion, MD;  Location: [place];  Service: Orthopedics;  Laterality: Right;  . AMPUTATION Right 02/23/2014   Procedure: AMPUTATION FOOT;  Surgeon: Newt Minion, MD;  Location: [place];  Service: Orthopedics;  Laterality: Right;  Right Foot Revision Transmetatarsal Amputation  . I&D EXTREMITY Right 05/23/2013   Procedure: IRRIGATION AND DEBRIDEMENT RIGHT GREAT TOE;  Surgeon: Mauri Pole, MD;  Location: [facility];  Service: Orthopedics;  Laterality: Right;    Family History  Problem Relation Age of Onset  . Liver cancer Mother   . Lung cancer Mother   . Depression Mother   . Hypertension Father   . Depression Father   . Colon cancer Neg Hx     Social History:  Lives alone--independent PTA. Ex husband assists with groceries.  Disabled. Uses cane/wheelfchair in the home and walker out of home. She reports that she has been smoking Cigarettes--has cut down to 2 cigarettes daily.  She has never used smokeless tobacco. She reports that she drinks alcohol. She reports that she uses marijuana to help with neuropathy.     Allergies: No Known Allergies    Medications Prior to Admission  Medication Sig Dispense Refill  . clonazePAM (KLONOPIN) 0.5 MG tablet take 1 tablet by mouth three times a day  90 tablet 0  . gabapentin (NEURONTIN) 400 MG capsule Take 3 capsules (1,200 mg total) by mouth 3 (three) times daily. 270 capsule 5  . glimepiride (AMARYL) 4 MG tablet take 1 tablet once daily 90 tablet 3  . Insulin Glargine (BASAGLAR KWIKPEN) 100 UNIT/ML SOPN Inject 0.38 mLs (38 Units total) into the skin at bedtime. 9 mL 6  .  lisinopril (PRINIVIL,ZESTRIL) 20 MG tablet Take 1 tablet (20 mg total) by mouth daily. 90 tablet 3  . simvastatin (ZOCOR) 40 MG tablet Take 1 tablet (40 mg total) by mouth at bedtime. 90 tablet 1  . aspirin EC 325 MG EC tablet Take 1 tablet (325 mg total) by mouth daily. 30 tablet 0  . COD LIVER OIL PO Take 1 capsule by mouth daily.    Marland Kitchen glucose blood test strip Test blood sugars 3 times daily Dx: E11.42 100 each 12  . Insulin Pen Needle (B-D UF III MINI PEN NEEDLES) 31G X 5 MM MISC Use as directed. 100 each 3  . lamoTRIgine (LAMICTAL) 100 MG tablet take 1/2 tablet by mouth twice a day (Patient not taking: Reported on 07/21/2017) 30 tablet 5  . ONE TOUCH LANCETS MISC USE TO CHECK BLOOD SUGAR 3 TIMES DAILY 200 each 3    Drug Regimen Review Drug regimen was reviewed and the following issues were identified and addressed : medications resumed or held per medical problem list below  Home: [place] expects to be discharged to:: Private residence (to her god daughter's home) Living Arrangements: Other (Comment) (god daughter's help) Available Help at Discharge: Family Type of Home: House (patients) Home Access: Stairs to enter Technical brewer of Steps: 2 (pt's home) Entrance Stairs-Rails: Right, Left Home Layout: One level Bathroom Shower/Tub: Chiropodist: Standard Home Equipment: Walker - 2 wheels, Bedside commode, Tub bench Additional Comments: information is for pt's home.   Functional History: Prior Function Level of Independence: Independent with assistive device(s) Comments: husband (separated) takes her to get groceries.  Functional Status:  Mobility: Bed Mobility Overal bed mobility: Needs Assistance Bed Mobility: Rolling, Sidelying to Sit, Sit to Sidelying Rolling: Min guard Sidelying to sit: Min assist Sit to sidelying: Mod assist General bed mobility comments: OOB on BSC Transfers Overall transfer level: Needs  assistance Equipment used: Rolling walker (2 wheeled) Transfers: Sit to/from Stand, Stand Pivot Transfers Sit to Stand: Min assist Stand pivot transfers: Mod assist General transfer comment: assisted pt coming forward and for stability.  Activity limited to standing. pt with difficulty moving for feet to takes steps up toward [facility]. Ambulation/Gait General Gait Details: Not tested, attempted sidestepping with difficulty and aborted in lieu of sitting down further up in the bed.    ADL: ADL Overall ADL's : Needs assistance/impaired Eating/Feeding: Modified independent Grooming: Supervision/safety, Set up, Sitting Upper Body Bathing: Minimal assistance, Sitting Lower Body Bathing: Moderate assistance, Sit to/from stand Upper Body Dressing : Minimal assistance, Sitting Lower Body Dressing: Moderate assistance, Sit to/from stand Toilet Transfer: Moderate assistance, Squat-pivot, BSC Toileting- Clothing Manipulation and Hygiene: Moderate assistance, Sit to/from stand Functional mobility during ADLs: Moderate assistance, Rolling walker, Cueing for safety General ADL Comments: limited mobility due to weakness  Cognition: Cognition Overall Cognitive Status: Within Functional Limits for tasks assessed Orientation Level: Oriented to person, Oriented to place, Oriented to situation, Disoriented to time Cognition Arousal/Alertness: Awake/alert Behavior During Therapy: [facility] for tasks assessed/performed Overall Cognitive Status: Within Functional Limits for tasks assessed General Comments: will further assess cognition   Blood  pressure (!) 154/63, pulse 63, temperature 97.8 F (36.6 C), temperature source Oral, resp. rate 20, height 5' 5"  (1.651 m), weight 84.1 kg (185 lb 6.5 oz), SpO2 94 %. Physical Exam  Nursing note and vitals reviewed. Constitutional: She is oriented to person, place, and time. She appears well-developed and well-nourished.  HENT:  Head: Normocephalic and atraumatic.   Mouth/Throat: Oropharynx is clear and moist.  Eyes: Pupils are equal, round, and reactive to light. Conjunctivae and EOM are normal.  Neck: Normal range of motion. Neck supple.  Cardiovascular: Normal rate and regular rhythm.  Exam reveals no friction rub.   No murmur heard. Respiratory: Effort normal and breath sounds normal. No stridor. No respiratory distress. She has no wheezes.  GI: Soft. Bowel sounds are normal. She exhibits no distension.  Musculoskeletal: She exhibits no edema or tenderness.  Left foot drop with tight heel cord  Neurological: She is alert and oriented to person, place, and time.  Speech with mild dysarthria.  Able to follow commands without difficulty. Left upper extremity 3+/5 prox to distal. LLE 3 to 3+/5. Senses light touch and pain. RUE and RLE 5/5 prox to distal. Displays reasonable insight and awareness.   Skin: Skin is warm and dry.  Psychiatric: She has a normal mood and affect. Her behavior is normal. Thought content normal.    Results for orders placed or performed during the hospital encounter of 07/20/17 (from the past 48 hour(s))  CBG monitoring, ED     Status: Abnormal   Collection Time: 07/21/17 10:30 AM  Result Value Ref Range   Glucose-Capillary 206 (H) 65 - 99 mg/dL   Comment 1 Notify RN    Comment 2 Document in Chart   CBG monitoring, ED     Status: Abnormal   Collection Time: 07/21/17  1:13 PM  Result Value Ref Range   Glucose-Capillary 170 (H) 65 - 99 mg/dL   Comment 1 Notify RN    Comment 2 Document in Chart   Glucose, capillary     Status: Abnormal   Collection Time: 07/21/17  4:18 PM  Result Value Ref Range   Glucose-Capillary 143 (H) 65 - 99 mg/dL   Comment 1 Notify RN    Comment 2 Document in Chart   Glucose, capillary     Status: Abnormal   Collection Time: 07/21/17  9:45 PM  Result Value Ref Range   Glucose-Capillary 120 (H) 65 - 99 mg/dL   Comment 1 Notify RN    Comment 2 Document in Chart   CBC     Status: None    Collection Time: 07/22/17  4:56 AM  Result Value Ref Range   WBC 8.7 4.0 - 10.5 K/uL   RBC 4.38 3.87 - 5.11 MIL/uL   Hemoglobin 12.0 12.0 - 15.0 g/dL   HCT 39.1 36.0 - 46.0 %   MCV 89.3 78.0 - 100.0 fL   MCH 27.4 26.0 - 34.0 pg   MCHC 30.7 30.0 - 36.0 g/dL   RDW 15.5 11.5 - 15.5 %   Platelets 317 150 - 400 K/uL  Basic metabolic panel     Status: Abnormal   Collection Time: 07/22/17  4:56 AM  Result Value Ref Range   Sodium 141 135 - 145 mmol/L   Potassium 3.9 3.5 - 5.1 mmol/L   Chloride 109 101 - 111 mmol/L   CO2 27 22 - 32 mmol/L   Glucose, Bld 72 65 - 99 mg/dL   BUN 15 6 - 20 mg/dL  Creatinine, Ser 1.39 (H) 0.44 - 1.00 mg/dL   Calcium 8.8 (L) 8.9 - 10.3 mg/dL   GFR calc non Af Amer 39 (L) >60 mL/min   GFR calc Af Amer 45 (L) >60 mL/min    Comment: (NOTE) The eGFR has been calculated using the CKD EPI equation. This calculation has not been validated in all clinical situations. eGFR's persistently <60 mL/min signify possible Chronic Kidney Disease.    Anion gap 5 5 - 15  Lipid panel     Status: Abnormal   Collection Time: 07/22/17  4:56 AM  Result Value Ref Range   Cholesterol 166 0 - 200 mg/dL   Triglycerides 191 (H) <150 mg/dL   HDL 41 >40 mg/dL   Total CHOL/HDL Ratio 4.0 RATIO   VLDL 38 0 - 40 mg/dL   LDL Cholesterol 87 0 - 99 mg/dL    Comment:        Total Cholesterol/HDL:CHD Risk Coronary Heart Disease Risk Table                     Men   Women  1/2 Average Risk   3.4   3.3  Average Risk       5.0   4.4  2 X Average Risk   9.6   7.1  3 X Average Risk  23.4   11.0        Use the calculated Patient Ratio above and the CHD Risk Table to determine the patient's CHD Risk.        ATP III CLASSIFICATION (LDL):  <100     mg/dL   Optimal  100-129  mg/dL   Near or Above                    Optimal  130-159  mg/dL   Borderline  160-189  mg/dL   High  >190     mg/dL   Very High   Glucose, capillary     Status: None   Collection Time: 07/22/17  8:20 AM  Result  Value Ref Range   Glucose-Capillary 73 65 - 99 mg/dL  Glucose, capillary     Status: Abnormal   Collection Time: 07/22/17 11:23 AM  Result Value Ref Range   Glucose-Capillary 167 (H) 65 - 99 mg/dL  Glucose, capillary     Status: Abnormal   Collection Time: 07/22/17  4:29 PM  Result Value Ref Range   Glucose-Capillary 173 (H) 65 - 99 mg/dL  Glucose, capillary     Status: Abnormal   Collection Time: 07/22/17  8:56 PM  Result Value Ref Range   Glucose-Capillary 188 (H) 65 - 99 mg/dL  Basic metabolic panel     Status: Abnormal   Collection Time: 07/23/17  5:09 AM  Result Value Ref Range   Sodium 139 135 - 145 mmol/L   Potassium 4.0 3.5 - 5.1 mmol/L   Chloride 109 101 - 111 mmol/L   CO2 26 22 - 32 mmol/L   Glucose, Bld 147 (H) 65 - 99 mg/dL   BUN 14 6 - 20 mg/dL   Creatinine, Ser 1.37 (H) 0.44 - 1.00 mg/dL   Calcium 8.4 (L) 8.9 - 10.3 mg/dL   GFR calc non Af Amer 40 (L) >60 mL/min   GFR calc Af Amer 46 (L) >60 mL/min    Comment: (NOTE) The eGFR has been calculated using the CKD EPI equation. This calculation has not been validated in all clinical situations. eGFR's persistently <60 mL/min  signify possible Chronic Kidney Disease.    Anion gap 4 (L) 5 - 15  CBC     Status: Abnormal   Collection Time: 07/23/17  5:09 AM  Result Value Ref Range   WBC 7.3 4.0 - 10.5 K/uL   RBC 3.93 3.87 - 5.11 MIL/uL   Hemoglobin 10.9 (L) 12.0 - 15.0 g/dL   HCT 34.8 (L) 36.0 - 46.0 %   MCV 88.5 78.0 - 100.0 fL   MCH 27.7 26.0 - 34.0 pg   MCHC 31.3 30.0 - 36.0 g/dL   RDW 15.0 11.5 - 15.5 %   Platelets 284 150 - 400 K/uL  Glucose, capillary     Status: Abnormal   Collection Time: 07/23/17  6:48 AM  Result Value Ref Range   Glucose-Capillary 135 (H) 65 - 99 mg/dL   Comment 1 Notify RN    Comment 2 Document in Chart    Mr Jodene Nam Neck W Wo Contrast  Result Date: 07/21/2017 CLINICAL DATA:  Stroke workup. EXAM: MRA NECK WITHOUT AND WITH CONTRAST MRA HEAD WITHOUT CONTRAST TECHNIQUE: Multiplanar and  multiecho pulse sequences of the neck were obtained without and with intravenous contrast. Angiographic images of the neck were obtained using MRA technique without and with intravenous contast.; Angiographic images of the Circle of Willis were obtained using MRA technique without intravenous contrast. CONTRAST:  38m MULTIHANCE GADOBENATE DIMEGLUMINE 529 MG/ML IV SOLN COMPARISON:  Brain MRI from earlier today FINDINGS: MRA NECK FINDINGS The arch has a normal diameter. Time-of-flight shows antegrade flow in both carotid and vertebral circulations. There is a 3 vessel arch branching. Proximal common carotid on the right is not well visualized due to artifact. The brachiocephalic is widely patent. Some atheromatous changes at the right common carotid bifurcation without flow limiting stenosis or ulceration. No indication of dissection. The left common carotid is diffusely patent. Left ICA origin stenosis measuring 40%. Negative for beading or dissection. Bilateral vertebral artery origin stenosis with bilateral flow gap. There is a stenosis of the proximal left subclavian artery which is difficult to quantify due to scan limitations, but at minimum there is 60% narrowing. Other than the V1 segments, the vertebral arteries are smooth and diffusely patent. Incidental goiter. MRA HEAD FINDINGS Strongly dominant right vertebral artery. Most of the left vertebral artery flow goes to a large PICA. Dominant right AICA. The vertebral and basilar arteries are smooth and widely patent. Atheromatous irregularity of the left more than right PCA, with a high-grade stenoses of both left P2 branches. Symmetric carotid arteries and branching. There is a low moderate right proximal M1 segment stenosis, considered atheromatous. Negative for aneurysm, branch occlusion, or generalized beading. IMPRESSION: Intracranial MRA: 1. No acute finding. 2. Intracranial atherosclerosis most significantly affecting the left PCA where there are  high-grade P2 branch stenoses. 3. Moderate right proximal M1 segment stenosis. Neck MRA: 1. ~40% left ICA origin stenosis. 2. At least 60% proximal left subclavian stenosis. 3. Bilateral high-grade vertebral origins stenoses with flow gaps. 4. Goiter. Electronically Signed   By: JMonte FantasiaM.D.   On: 07/21/2017 10:56   Mr MJodene NamHead Wo Contrast  Result Date: 07/21/2017 CLINICAL DATA:  Stroke workup. EXAM: MRA NECK WITHOUT AND WITH CONTRAST MRA HEAD WITHOUT CONTRAST TECHNIQUE: Multiplanar and multiecho pulse sequences of the neck were obtained without and with intravenous contrast. Angiographic images of the neck were obtained using MRA technique without and with intravenous contast.; Angiographic images of the Circle of Willis were obtained using MRA technique without intravenous  contrast. CONTRAST:  9m MULTIHANCE GADOBENATE DIMEGLUMINE 529 MG/ML IV SOLN COMPARISON:  Brain MRI from earlier today FINDINGS: MRA NECK FINDINGS The arch has a normal diameter. Time-of-flight shows antegrade flow in both carotid and vertebral circulations. There is a 3 vessel arch branching. Proximal common carotid on the right is not well visualized due to artifact. The brachiocephalic is widely patent. Some atheromatous changes at the right common carotid bifurcation without flow limiting stenosis or ulceration. No indication of dissection. The left common carotid is diffusely patent. Left ICA origin stenosis measuring 40%. Negative for beading or dissection. Bilateral vertebral artery origin stenosis with bilateral flow gap. There is a stenosis of the proximal left subclavian artery which is difficult to quantify due to scan limitations, but at minimum there is 60% narrowing. Other than the V1 segments, the vertebral arteries are smooth and diffusely patent. Incidental goiter. MRA HEAD FINDINGS Strongly dominant right vertebral artery. Most of the left vertebral artery flow goes to a large PICA. Dominant right AICA. The  vertebral and basilar arteries are smooth and widely patent. Atheromatous irregularity of the left more than right PCA, with a high-grade stenoses of both left P2 branches. Symmetric carotid arteries and branching. There is a low moderate right proximal M1 segment stenosis, considered atheromatous. Negative for aneurysm, branch occlusion, or generalized beading. IMPRESSION: Intracranial MRA: 1. No acute finding. 2. Intracranial atherosclerosis most significantly affecting the left PCA where there are high-grade P2 branch stenoses. 3. Moderate right proximal M1 segment stenosis. Neck MRA: 1. ~40% left ICA origin stenosis. 2. At least 60% proximal left subclavian stenosis. 3. Bilateral high-grade vertebral origins stenoses with flow gaps. 4. Goiter. Electronically Signed   By: JMonte FantasiaM.D.   On: 07/21/2017 10:56       Medical Problem List and Plan: 1.  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  DVT Prophylaxis/Anticoagulation: Pharmaceutical: Lovenox 3. Pain Management: tylenol prn 4. Mood: LCSW to follow for evaluation and support.  5. Neuropsych: This patient is capable of making decisions on her own behalf. 6. Skin/Wound Care: Continue pressure relief measures.  7. Fluids/Electrolytes/Nutrition: Encourage fluid intake. Check lytes in am.  8. HTN: Monitor BP bid --will continue to hold lisinopril to allow for adequate perfusion another 2-3 days 9. T2DM: Poorly controlled with Hgb A1C- 9.2.  Monitor BS ac/hs. Continue lantus 15 units. Resume amaryl.  10. Acute on chronic renal failure?: Baseline SCr- 1.3- 1.4. --discontinue IVF.  11. H/o bipolar disorder: has been managed with klonopin tid.  12. ABLA: Will recheck CBC in am. Order stool guaiacs.  13. Peripheral neuropathy: Continue gabapentin 1200 mg tid. 14. Substance abuse: team to have further conversations regarding drug use. Set up with appropriate counseling after  discharge     Post Admission Physician Evaluation: 1. Functional deficits secondary  to right PLIC infarct. 2. Patient is admitted to receive collaborative, interdisciplinary care between the physiatrist, rehab nursing staff, and therapy team. 3. Patient's level of medical complexity and substantial therapy needs in context of that medical necessity cannot be provided at a lesser intensity of care such as a SNF. 4. Patient has experienced substantial functional loss from his/her baseline which was documented above under the "Functional History" and "Functional Status" headings.  Judging by the patient's diagnosis, physical exam, and functional history, the patient has potential for functional progress which will result in measurable gains while on inpatient rehab.  These gains will be of substantial and practical  use upon discharge  in facilitating mobility and self-care at the household level. 5. Physiatrist will provide 24 hour management of medical needs as well as oversight of the therapy plan/treatment and provide guidance as appropriate regarding the interaction of the two. 6. The Preadmission Screening has been reviewed and patient status is unchanged unless otherwise stated above. 7. 24 hour rehab nursing will assist with bladder management, bowel management, safety, skin/wound care, disease management, medication administration, pain management and patient education  and help integrate therapy concepts, techniques,education, etc. 8. PT will assess and treat for/with: Lower extremity strength, range of motion, stamina, balance, functional mobility, safety, adaptive techniques and equipment, NMR, vestibular assessment, community reintegration.   Goals are: mod I. 9. OT will assess and treat for/with: ADL's, functional mobility, safety, upper extremity strength, adaptive techniques and equipment, NMR, community reentry, ego support.   Goals are: mod I. Therapy may proceed with showering this  patient. 10. SLP will assess and treat for/with: speech, communication.  Goals are: mod I. 11. Case Management and Social Worker will assess and treat for psychological issues and discharge planning. 12. Team conference will be held weekly to assess progress toward goals and to determine barriers to discharge. 13. Patient will receive at least 3 hours of therapy per day at least 5 days per week. 14. ELOS: 7-11 days       15. Prognosis:  excellent     Meredith Staggers, MD, [place] [facility] 07/23/2017  Bary Leriche, Hershal Coria 07/23/2017

## 2017-07-23 NOTE — Care Management Note (Signed)
Case Management Note  Patient Details  Name: Tara xxxChestnut MRN: [id number] Date of Birth: 03-23-1951  Subjective/Objective:                    Action/Plan: Pt discharging to CIR. No further CM needs.   Expected Discharge Date:  07/23/17               Expected Discharge Plan:  [place]  In-House Referral:     Discharge planning Services  CM Consult  Post Acute Care Choice:    Choice offered to:     DME Arranged:    DME Agency:     HH Arranged:    HH Agency:     Status of Service:  Completed, signed off  If discussed at [employer], dates discussed:    Additional Comments:  Pollie Friar, RN 07/23/2017, 11:12 AM

## 2017-07-23 NOTE — Discharge Summary (Signed)
Physician Discharge Summary   Patient ID: Tara Hopkins MRN: [id number] DOB/AGE: August 23, 1951 66 y.o.  Admit date: 07/20/2017 Discharge date: 07/23/2017  Primary Care Physician:  Tara Peng, NP  Discharge Diagnoses:    . acute CVA (cerebral vascular accident) ([place]) . DM type 2 with diabetic peripheral neuropathy ([place]) . Essential hypertension . Hyperlipidemia  anxiety    Consults: neurology CIR  Recommendations for Outpatient Follow-up:  1. Please repeat CBC/BMET at next visit 2. Patient placed on Lipitor, Plavix. Aspirin discontinued   DIET:carb modified diet    Allergies:  No Known Allergies   DISCHARGE MEDICATIONS: Current Discharge Medication List    START taking these medications   Details  atorvastatin (LIPITOR) 40 MG tablet Take 1 tablet (40 mg total) by mouth daily at 6 PM. Qty: 30 tablet, Refills: 4    clopidogrel (PLAVIX) 75 MG tablet Take 1 tablet (75 mg total) by mouth daily. Qty: 30 tablet, Refills: 3      CONTINUE these medications which have NOT CHANGED   Details  clonazePAM (KLONOPIN) 0.5 MG tablet take 1 tablet by mouth three times a day Qty: 90 tablet, Refills: 0    gabapentin (NEURONTIN) 400 MG capsule Take 3 capsules (1,200 mg total) by mouth 3 (three) times daily. Qty: 270 capsule, Refills: 5   Associated Diagnoses: Neuropathy    glimepiride (AMARYL) 4 MG tablet take 1 tablet once daily Qty: 90 tablet, Refills: 3    Insulin Glargine (BASAGLAR KWIKPEN) 100 UNIT/ML SOPN Inject 0.38 mLs (38 Units total) into the skin at bedtime. Qty: 9 mL, Refills: 6   Associated Diagnoses: DM type 2 with diabetic peripheral neuropathy (H[facility])    lisinopril (PRINIVIL,ZESTRIL) 20 MG tablet Take 1 tablet (20 mg total) by mouth daily. Qty: 90 tablet, Refills: 3   Associated Diagnoses: Essential hypertension    COD LIVER OIL PO Take 1 capsule by mouth daily.    glucose blood test strip Test blood sugars 3 times daily Dx: E11.42 Qty: 100 each,  Refills: 12    Insulin Pen Needle (B-D UF III MINI PEN NEEDLES) 31G X 5 MM MISC Use as directed. Qty: 100 each, Refills: 3    lamoTRIgine (LAMICTAL) 100 MG tablet take 1/2 tablet by mouth twice a day Qty: 30 tablet, Refills: 5    ONE TOUCH LANCETS MISC USE TO CHECK BLOOD SUGAR 3 TIMES DAILY Qty: 200 each, Refills: 3      STOP taking these medications     simvastatin (ZOCOR) 40 MG tablet      aspirin EC 325 MG EC tablet        Patient needs to have following prescriptions at the final discharge from CIR  Lipitor 40 mg daily Plavix 75 mg daily  Brief H and P: For complete details please refer to admission H and P, but in brief admit note by Dr. Barbaraann Hopkins on 9/12 Tara Hopkins a 66 y.o.femalewith medical history significant of anemia, anxiety/Bipolar, DM, glaucoma, HTN, HLD, peripheral neuropathy, CVA w/ L sided paralysis. Pt reports awaking on 07/20/17 with speech difficulty predominantly described as slurring of speech. Patient didn't do anything for her symptoms prevent cannot around the middle of the day. When patient awoke at approximately 15:00 the L side of her body had become weak.  CT head showed no acute intracranial abnormalities.   Hospital Course:  CVA (cerebral vascular accident) [facility]) Presented with left-sided weakness - CT head was negative for acute intracranial abnormality - MRI of the brain showed subcentimeter acute/early subacute infarction  in the right posterior limb of the internal capsule, no acute hemorrhage. Moderate chronic microvascular ischemic changes. Chronic lacunar infarcts of the basal ganglia and pons - MRA head and neck: No acute findings, intracranial stenosis was significantly affecting the left PCA, high-grade P2 present stenosis. Neck MRA showed ~40% left ICA origin stenosis, 60% proximal left subclavian stenosis - hemoglobin A1c 9.2 - LDL 87, goal less than 70, placed on Lipitor. Aspirin discontinued, placed on Plavix  - Neurology  following, PT evaluation recommended CIR     DM type 2 with diabetic peripheral neuropathy ([place]) - Continue sliding scale insulin while inpatient,  - Hemoglobin A1c 9.2    Essential hypertension - restart lisinopril    Hyperlipidemia - LDL 87, continue Lipitor   Anxiety Continue Klonopin 0.5mg  TID   Day of Discharge BP (!) 154/63 (BP Location: Right Arm)   Pulse 63   Temp 97.8 F (36.6 C) (Oral)   Resp 20   Ht 5\' 5"  (1.651 m)   Wt 84.1 kg (185 lb 6.5 oz)   SpO2 94%   BMI 30.85 kg/m   Physical Exam: General: Alert and awake oriented x3 not in any acute distress. HEENT: anicteric sclera, pupils reactive to light and accommodation CVS: S1-S2 clear no murmur rubs or gallops Chest: clear to auscultation bilaterally, no wheezing rales or rhonchi Abdomen: soft nontender, nondistended, normal bowel sounds Extremities: no cyanosis, clubbing or edema noted bilaterally Neuro: : AAOx3, Cr N's II- XII. Strength 5/5 RUE, RLE. 4/5 LUE, 5/5 LLE    The results of significant diagnostics from this hospitalization (including imaging, microbiology, ancillary and laboratory) are listed below for reference.    LAB RESULTS: Basic Metabolic Panel:  Recent Labs Lab 07/22/17 0456 07/23/17 0509  NA 141 139  K 3.9 4.0  CL 109 109  CO2 27 26  GLUCOSE 72 147*  BUN 15 14  CREATININE 1.39* 1.37*  CALCIUM 8.8* 8.4*   Liver Function Tests:  Recent Labs Lab 07/20/17 2355  AST 19  ALT 15  ALKPHOS 67  BILITOT 0.4  PROT 6.2*  ALBUMIN 3.1*   No results for input(s): LIPASE, AMYLASE in the last 168 hours. No results for input(s): AMMONIA in the last 168 hours. CBC:  Recent Labs Lab 07/20/17 2355 07/22/17 0456 07/23/17 0509  WBC 10.2 8.7 7.3  NEUTROABS 6.1  --   --   HGB 11.1* 12.0 10.9*  HCT 35.4* 39.1 34.8*  MCV 88.1 89.3 88.5  PLT 314 317 284   Cardiac Enzymes: No results for input(s): CKTOTAL, CKMB, CKMBINDEX, TROPONINI in the last 168 hours. BNP: Invalid  input(s): POCBNP CBG:  Recent Labs Lab 07/23/17 0648 07/23/17 1107  GLUCAP 135* 145*    Significant Diagnostic Studies:  Ct Head Wo Contrast  Result Date: 07/20/2017 CLINICAL DATA:  Focal neurologic deficit for greater than 6 hours, stroke suspected. Left-sided weakness. EXAM: CT HEAD WITHOUT CONTRAST TECHNIQUE: Contiguous axial images were obtained from the base of the skull through the vertex without intravenous contrast. COMPARISON:  Head CT 07/17/2015 FINDINGS: Brain: No acute hemorrhage. No evidence of acute territorial ischemia. Generalized atrophy and chronic small vessel ischemic changes, stable from prior. Remote lacunar infarct in the periventricular white matter, unchanged. No hydrocephalus, midline shift or mass effect. Vascular: Atherosclerosis of skullbase vasculature without hyperdense vessel or abnormal calcification. Skull: No skull fracture or focal lesion. Sinuses/Orbits: Frontal sinuses and left mastoid air cells are hypoplastic. No acute finding. No inflammatory change. Bilateral cataract resection. Other: None. IMPRESSION: 1. No  acute intracranial abnormality. No evidence of acute ischemia on noncontrast head CT. 2. Stable atrophy, chronic small vessel ischemia and remote lacunar infarct. Electronically Signed   By: Jeb Levering M.D.   On: 07/20/2017 23:58   Mr Jodene Nam Neck W Wo Contrast  Result Date: 07/21/2017 CLINICAL DATA:  Stroke workup. EXAM: MRA NECK WITHOUT AND WITH CONTRAST MRA HEAD WITHOUT CONTRAST TECHNIQUE: Multiplanar and multiecho pulse sequences of the neck were obtained without and with intravenous contrast. Angiographic images of the neck were obtained using MRA technique without and with intravenous contast.; Angiographic images of the Circle of Willis were obtained using MRA technique without intravenous contrast. CONTRAST:  27mL MULTIHANCE GADOBENATE DIMEGLUMINE 529 MG/ML IV SOLN COMPARISON:  Brain MRI from earlier today FINDINGS: MRA NECK FINDINGS The arch  has a normal diameter. Time-of-flight shows antegrade flow in both carotid and vertebral circulations. There is a 3 vessel arch branching. Proximal common carotid on the right is not well visualized due to artifact. The brachiocephalic is widely patent. Some atheromatous changes at the right common carotid bifurcation without flow limiting stenosis or ulceration. No indication of dissection. The left common carotid is diffusely patent. Left ICA origin stenosis measuring 40%. Negative for beading or dissection. Bilateral vertebral artery origin stenosis with bilateral flow gap. There is a stenosis of the proximal left subclavian artery which is difficult to quantify due to scan limitations, but at minimum there is 60% narrowing. Other than the V1 segments, the vertebral arteries are smooth and diffusely patent. Incidental goiter. MRA HEAD FINDINGS Strongly dominant right vertebral artery. Most of the left vertebral artery flow goes to a large PICA. Dominant right AICA. The vertebral and basilar arteries are smooth and widely patent. Atheromatous irregularity of the left more than right PCA, with a high-grade stenoses of both left P2 branches. Symmetric carotid arteries and branching. There is a low moderate right proximal M1 segment stenosis, considered atheromatous. Negative for aneurysm, branch occlusion, or generalized beading. IMPRESSION: Intracranial MRA: 1. No acute finding. 2. Intracranial atherosclerosis most significantly affecting the left PCA where there are high-grade P2 branch stenoses. 3. Moderate right proximal M1 segment stenosis. Neck MRA: 1. ~40% left ICA origin stenosis. 2. At least 60% proximal left subclavian stenosis. 3. Bilateral high-grade vertebral origins stenoses with flow gaps. 4. Goiter. Electronically Signed   By: Monte Fantasia M.D.   On: 07/21/2017 10:56   Mr Brain Wo Contrast  Result Date: 07/21/2017 CLINICAL DATA:  66 y/o F; increase left extremity weakness and slurred speech.  History of stroke with left-sided deficits. EXAM: MRI HEAD WITHOUT CONTRAST TECHNIQUE: Multiplanar, multiecho pulse sequences of the brain and surrounding structures were obtained without intravenous contrast. COMPARISON:  07/20/2017 CT of the head FINDINGS: Brain: Subcentimeter focus of reduced diffusion in the right posterior limb of internal capsule. Mild associated T2 hyperintensity. No acute hemorrhage. Moderate chronic microvascular ischemic changes and mild brain parenchymal volume loss. Chronic lacunar infarcts within the right paramedian pons, bilateral thalamus, left mid corona radiata, and right posterior corona radiata. No abnormal susceptibility hypointensity to indicate intracranial hemorrhage. No hydrocephalus or effacement of basilar cisterns. No focal mass effect. Vascular:  Normal flow voids. Skull and upper cervical spine: Normal marrow signal. Sinuses/Orbits: Negative. Other: None. IMPRESSION: 1. Subcentimeter acute/early subacute infarction in right posterior limb of internal capsule. No acute hemorrhage. 2. Moderate chronic microvascular ischemic changes and moderate parenchymal volume loss of the brain. Chronic lacunar infarcts of basal ganglia and pons. These results will be called to the ordering  clinician or representative by the Radiologist Assistant, and communication documented in the PACS or zVision Dashboard. Electronically Signed   By: Kristine Garbe M.D.   On: 07/21/2017 04:59   Mr Jodene Nam Head Wo Contrast  Result Date: 07/21/2017 CLINICAL DATA:  Stroke workup. EXAM: MRA NECK WITHOUT AND WITH CONTRAST MRA HEAD WITHOUT CONTRAST TECHNIQUE: Multiplanar and multiecho pulse sequences of the neck were obtained without and with intravenous contrast. Angiographic images of the neck were obtained using MRA technique without and with intravenous contast.; Angiographic images of the Circle of Willis were obtained using MRA technique without intravenous contrast. CONTRAST:  72mL  MULTIHANCE GADOBENATE DIMEGLUMINE 529 MG/ML IV SOLN COMPARISON:  Brain MRI from earlier today FINDINGS: MRA NECK FINDINGS The arch has a normal diameter. Time-of-flight shows antegrade flow in both carotid and vertebral circulations. There is a 3 vessel arch branching. Proximal common carotid on the right is not well visualized due to artifact. The brachiocephalic is widely patent. Some atheromatous changes at the right common carotid bifurcation without flow limiting stenosis or ulceration. No indication of dissection. The left common carotid is diffusely patent. Left ICA origin stenosis measuring 40%. Negative for beading or dissection. Bilateral vertebral artery origin stenosis with bilateral flow gap. There is a stenosis of the proximal left subclavian artery which is difficult to quantify due to scan limitations, but at minimum there is 60% narrowing. Other than the V1 segments, the vertebral arteries are smooth and diffusely patent. Incidental goiter. MRA HEAD FINDINGS Strongly dominant right vertebral artery. Most of the left vertebral artery flow goes to a large PICA. Dominant right AICA. The vertebral and basilar arteries are smooth and widely patent. Atheromatous irregularity of the left more than right PCA, with a high-grade stenoses of both left P2 branches. Symmetric carotid arteries and branching. There is a low moderate right proximal M1 segment stenosis, considered atheromatous. Negative for aneurysm, branch occlusion, or generalized beading. IMPRESSION: Intracranial MRA: 1. No acute finding. 2. Intracranial atherosclerosis most significantly affecting the left PCA where there are high-grade P2 branch stenoses. 3. Moderate right proximal M1 segment stenosis. Neck MRA: 1. ~40% left ICA origin stenosis. 2. At least 60% proximal left subclavian stenosis. 3. Bilateral high-grade vertebral origins stenoses with flow gaps. 4. Goiter. Electronically Signed   By: Monte Fantasia M.D.   On: 07/21/2017 10:56     2D ECHO: Study Conclusions  - Left ventricle: The cavity size was normal. There was moderate   focal basal hypertrophy of the septum. Systolic function was   normal. The estimated ejection fraction was in the range of 55%   to 60%. Wall motion was normal; there were no regional wall   motion abnormalities. Features are consistent with a pseudonormal   left ventricular filling pattern, with concomitant abnormal   relaxation and increased filling pressure (grade 2 diastolic   dysfunction). - Mitral valve: There was mild regurgitation. - Tricuspid valve: There was mild regurgitation. - Pulmonary arteries: Systolic pressure was mildly increased. PA   peak pressure: 34 mm Hg (S).  Impressions:  - No cardiac source of emboli was indentified. Compared to the   prior study, there has been no significant interval change.  Disposition and Follow-up: Discharge Instructions    Ambulatory referral to Neurology    Complete by:  As directed    Follow up with stroke clinic Cecille Rubin preferred, if not available, then consider Caesar Mceachron, [facility] or Jaynee Eagles whoever is available) at [facility] in about 6-8 weeks. Thanks.  DISPOSITION: INPATIENT REHABILITATION   DISCHARGE FOLLOW-UP Follow-up Information    Dennie Bible, NP. Schedule an appointment as soon as possible for a visit in 6 week(s).   Specialty:  Family Medicine Contact information: [address] [place] [address] [phone]        Tara Hopkins, [place]. Schedule an appointment as soon as possible for a visit in 2 week(s).   Specialty:  Family Medicine Contact information: [address] [place] [address] [phone]            Time spent on Discharge: 35MINS   Signed:   Estill Cotta M.D. [facility] 07/23/2017, 11:55 AM Pager: [phone]

## 2017-07-23 NOTE — H&P (Signed)
Physical Medicine and Rehabilitation Admission H&P     Chief Complaint  Patient presents with  . Stroke with worsening of left sided weakness  HPI: Tara Hopkins is a 66 y.o. female with history of HTN, T2DM with peripheral neuropathy, bipolar disorder, polysubstance abuse, CVA with residual L-HP--CIR stay, who was admitted on 07/21/17 with worsening of left sided weakness. MRI brain revealed acute/subacute infarct in right posterior limb of internal capsule and moderate parenchymal volume loss of brain with moderate SVD. MRA brain/neck showed intracranial stenosis affecting left PCA with high grade stenosis P2 branches, 40% stenosis L-ICA, 60% stensis L-SA and bilateral high grade vertebral origin stenosis. 2D echo showed EF 55-60% with no wall abnormality and grade 2 diastolic dysfunction. UDS positive for THC and patient admitted to using cocaine 3-4 weeks ago. Neurology recommended changing ASA to plavix and starting high intensity statin. Therapy evaluations done yesterday and CIR recommended due to deficits in mobility.  Review of Systems  HENT: Negative for hearing loss and tinnitus.  Eyes: Negative for blurred vision and double vision.  Respiratory: Negative for cough and shortness of breath.  Cardiovascular: Negative for chest pain and palpitations.  Gastrointestinal: Negative for constipation, heartburn and nausea.  Genitourinary: Negative for dysuria and urgency.  Musculoskeletal: Positive for neck pain (slept wrong last night).  Skin: Negative for itching and rash.  Neurological: Positive for sensory change and focal weakness. Negative for dizziness and headaches.  Psychiatric/Behavioral: Positive for substance abuse (uses marijuana for neuropathy). The patient does not have insomnia.       Past Medical History:  Diagnosis Date  . Anemia   . Anxiety   . Bipolar 1 disorder ([place])   . Cataracts, bilateral   . Depression   . Diabetes mellitus without complication ([place])   .  Glaucoma   . History of blood transfusion   . History of hyperbaric oxygen therapy    pt reports 80 treatments.  . Hypercholesteremia   . Hypertension   . Peripheral neuropathy   . Stroke [facility]) 02/2015        Past Surgical History:  Procedure Laterality Date  . AMPUTATION Right 12/15/2013   Procedure: Right Transmetatarsal Amputation; Surgeon: Newt Minion, MD; Location: [place]; Service: Orthopedics; Laterality: Right;  . AMPUTATION Right 02/23/2014   Procedure: AMPUTATION FOOT; Surgeon: Newt Minion, MD; Location: [place]; Service: Orthopedics; Laterality: Right; Right Foot Revision Transmetatarsal Amputation  . I&D EXTREMITY Right 05/23/2013   Procedure: IRRIGATION AND DEBRIDEMENT RIGHT GREAT TOE; Surgeon: Mauri Pole, MD; Location[facility]; Service: Orthopedics; Laterality: Right;        Family History  Problem Relation Age of Onset  . Liver cancer Mother   . Lung cancer Mother   . Depression Mother   . Hypertension Father   . Depression Father   . Colon cancer Neg Hx    Social History: Lives alone--independent PTA. Ex husband assists with groceries. Disabled. Uses cane/wheelfchair in the home and walker out of home. She reports that she has been smoking Cigarettes--has cut down to 2 cigarettes daily. She has never used smokeless tobacco. She reports that she drinks alcohol. She reports that she uses marijuana to help with neuropathy.  Allergies: No Known Allergies        Medications Prior to Admission  Medication Sig Dispense Refill  . clonazePAM (KLONOPIN) 0.5 MG tablet take 1 tablet by mouth three times a day 90 tablet 0  . gabapentin (NEURONTIN) 400 MG capsule Take 3 capsules (1,200 mg  total) by mouth 3 (three) times daily. 270 capsule 5  . glimepiride (AMARYL) 4 MG tablet take 1 tablet once daily 90 tablet 3  . Insulin Glargine (BASAGLAR KWIKPEN) 100 UNIT/ML SOPN Inject 0.38 mLs (38 Units total) into the skin at bedtime. 9 mL 6  . lisinopril (PRINIVIL,ZESTRIL) 20 MG tablet  Take 1 tablet (20 mg total) by mouth daily. 90 tablet 3  . simvastatin (ZOCOR) 40 MG tablet Take 1 tablet (40 mg total) by mouth at bedtime. 90 tablet 1  . aspirin EC 325 MG EC tablet Take 1 tablet (325 mg total) by mouth daily. 30 tablet 0  . COD LIVER OIL PO Take 1 capsule by mouth daily.    Marland Kitchen glucose blood test strip Test blood sugars 3 times daily Dx: E11.42 100 each 12  . Insulin Pen Needle (B-D UF III MINI PEN NEEDLES) 31G X 5 MM MISC Use as directed. 100 each 3  . lamoTRIgine (LAMICTAL) 100 MG tablet take 1/2 tablet by mouth twice a day (Patient not taking: Reported on 07/21/2017) 30 tablet 5  . ONE TOUCH LANCETS MISC USE TO CHECK BLOOD SUGAR 3 TIMES DAILY 200 each 3   Drug Regimen Review  Drug regimen was reviewed and the following issues were identified and addressed : medications resumed or held per medical problem list below  Home:  [place] expects to be discharged to:: Private residence (to her god daughter's home)  Living Arrangements: Other (Comment) (god daughter's help)  Available Help at Discharge: Family  Type of Home: House (patients)  Home Access: Stairs to enter  Technical brewer of Steps: 2 (pt's home)  Entrance Stairs-Rails: Right, Left  Home Layout: One level  Bathroom Shower/Tub: Administrator, Civil Service: Standard  Home Equipment: Walker - 2 wheels, Bedside commode, Tub bench  Additional Comments: information is for pt's home.  Functional History:  Prior Function  Level of Independence: Independent with assistive device(s)  Comments: husband (separated) takes her to get groceries.  Functional Status:  Mobility:  Bed Mobility  Overal bed mobility: Needs Assistance  Bed Mobility: Rolling, Sidelying to Sit, Sit to Sidelying  Rolling: Min guard  Sidelying to sit: Min assist  Sit to sidelying: Mod assist  General bed mobility comments: OOB on BSC  Transfers  Overall transfer level: Needs assistance  Equipment used: Rolling  walker (2 wheeled)  Transfers: Sit to/from Stand, Stand Pivot Transfers  Sit to Stand: Min assist  Stand pivot transfers: Mod assist  General transfer comment: assisted pt coming forward and for stability. Activity limited to standing. pt with difficulty moving for feet to takes steps up toward [facility].  Ambulation/Gait  General Gait Details: Not tested, attempted sidestepping with difficulty and aborted in lieu of sitting down further up in the bed.   ADL:  ADL  Overall ADL's : Needs assistance/impaired  Eating/Feeding: Modified independent  Grooming: Supervision/safety, Set up, Sitting  Upper Body Bathing: Minimal assistance, Sitting  Lower Body Bathing: Moderate assistance, Sit to/from stand  Upper Body Dressing : Minimal assistance, Sitting  Lower Body Dressing: Moderate assistance, Sit to/from stand  Toilet Transfer: Moderate assistance, Squat-pivot, BSC  Toileting- Clothing Manipulation and Hygiene: Moderate assistance, Sit to/from stand  Functional mobility during ADLs: Moderate assistance, Rolling walker, Cueing for safety  General ADL Comments: limited mobility due to weakness  Cognition:  Cognition  Overall Cognitive Status: Within Functional Limits for tasks assessed  Orientation Level: Oriented to person, Oriented to place, Oriented to situation, Disoriented to  time  Cognition  Arousal/Alertness: Awake/alert  Behavior During Therapy: WFL for tasks assessed/performed  Overall Cognitive Status: Within Functional Limits for tasks assessed  General Comments: will further assess cognition  Blood pressure (!) 154/63, pulse 63, temperature 97.8 F (36.6 C), temperature source Oral, resp. rate 20, height 5\' 5"  (1.651 m), weight 84.1 kg (185 lb 6.5 oz), SpO2 94 %.  Physical Exam  Nursing note and vitals reviewed.  Constitutional: She is oriented to person, place, and time. She appears well-developed and well-nourished.  HENT:  Head: Normocephalic and atraumatic.  Mouth/Throat:  Oropharynx is clear and moist.  Eyes: Pupils are equal, round, and reactive to light. Conjunctivae and EOM are normal.  Neck: Normal range of motion. Neck supple.  Cardiovascular: Normal rate and regular rhythm. Exam reveals no friction rub.  No murmur heard.  Respiratory: Effort normal and breath sounds normal. No stridor. No respiratory distress. She has no wheezes.  GI: Soft. Bowel sounds are normal. She exhibits no distension.  Musculoskeletal: She exhibits no edema or tenderness.  Left foot drop with tight heel cord  Neurological: She is alert and oriented to person, place, and time.  Speech with mild dysarthria. Able to follow commands without difficulty. Left upper extremity 3+/5 prox to distal. LLE 3 to 3+/5. Senses light touch and pain. RUE and RLE 5/5 prox to distal. Displays reasonable insight and awareness.  Skin: Skin is warm and dry.  Psychiatric: She has a normal mood and affect. Her behavior is normal. Thought content normal.   Lab Results Last 48 Hours  Imaging Results (Last 48 hours)     Medical Problem List and Plan:  1. 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 DVT Prophylaxis/Anticoagulation: Pharmaceutical: Lovenox  3.  Pain Management: tylenol prn  4. Mood: LCSW to follow for evaluation and support.  5. Neuropsych: This patient is capable of making decisions on her own behalf.  6. Skin/Wound Care: Continue pressure relief measures.  7. Fluids/Electrolytes/Nutrition: Encourage fluid intake. Check lytes in am.  8. HTN: Monitor BP bid --will continue to hold lisinopril to allow for adequate perfusion another 2-3 days  9. T2DM: Poorly controlled with Hgb A1C- 9.2. Monitor BS ac/hs. Continue lantus 15 units. Resume amaryl.  10. Acute on chronic renal failure?: Baseline SCr- 1.3- 1.4. --discontinue IVF.  11. H/o bipolar disorder: has been managed with klonopin tid.  12. ABLA: Will recheck CBC in am. Order stool guaiacs.  13. Peripheral neuropathy: Continue gabapentin 1200 mg tid.  14. Substance abuse: team to have further conversations regarding drug use. Set up with appropriate counseling after discharge  Post Admission Physician Evaluation:  1. Functional deficits secondary to right PLIC infarct. 2. Patient is admitted to receive collaborative, interdisciplinary care between the physiatrist, rehab nursing staff, and therapy team. 3. Patient's level of medical complexity and substantial therapy needs in context of that medical necessity cannot be provided at a lesser intensity of care such as a SNF. 4. Patient has experienced substantial functional loss from his/her baseline which was documented above under the "Functional History" and "Functional Status" headings. Judging by the patient's diagnosis, physical exam, and functional history, the patient has potential for functional progress which will result in measurable gains while on inpatient rehab. These gains will be of substantial and practical use upon discharge in facilitating mobility and self-care at the household level. 5. Physiatrist will provide 24 hour management of medical needs as well as oversight of the therapy plan/treatment and provide guidance as  appropriate regarding the interaction of the two. 6. The Preadmission Screening has been reviewed and patient status is unchanged unless otherwise stated above. 7. 24 hour rehab nursing will assist with bladder management, bowel management, safety, skin/wound care, disease management, medication administration, pain management and patient education and help integrate therapy concepts, techniques,education, etc. 8. PT will assess and treat for/with: Lower extremity strength, range of motion, stamina, balance, functional mobility, safety, adaptive techniques and equipment, NMR, vestibular assessment, community reintegration. Goals are: mod I. 9. OT will assess and treat for/with: ADL's, functional mobility, safety, upper extremity strength, adaptive techniques and equipment, NMR, community reentry, ego support. Goals are: mod I. Therapy may proceed with showering this patient. 10. SLP will assess and treat for/with: speech, communication. Goals are: mod I. 11. Case Management and Social Worker will assess and treat for psychological issues and discharge planning. 12. Team conference will be held weekly to assess progress toward goals and to determine barriers to discharge. 13. Patient will receive at least 3 hours of therapy per day at least 5 days per week. 14. ELOS: 7-11 days  15. Prognosis: excellent Meredith Staggers, MD, [facility] [place] [facility]  07/23/2017  Bary Leriche, Hershal Coria  07/23/2017

## 2017-07-23 NOTE — Plan of Care (Signed)
Problem: Food- and Nutrition-Related Knowledge Deficit (NB-1.1) Goal: Nutrition education Formal process to instruct or train a patient/client in a skill or to impart knowledge to help patients/clients voluntarily manage or modify food choices and eating behavior to maintain or improve health. Outcome: Completed/Met Date Met: 07/23/17 Nutrition Education Note  RD consulted for diet education.  Lipid Panel     Component Value Date/Time   CHOL 166 07/22/2017 0456   TRIG 191 (H) 07/22/2017 0456   HDL 41 07/22/2017 0456   CHOLHDL 4.0 07/22/2017 0456   VLDL 38 07/22/2017 0456   LDLCALC 87 07/22/2017 0456    RD provided "Heart Healthy Nutrition Therapy" as well as "Counting Carbohydrates for People with Diabetes" handouts from the Academy of Nutrition and Dietetics. Reviewed patient's dietary recall. Provided examples on ways to decrease sodium and fat intake in diet. Discouraged intake of processed foods and use of salt shaker. Encouraged fresh fruits and vegetables as well as whole grain sources of carbohydrates to maximize fiber intake. Discussed diabetic friendly drink options. Teach back method used.  Expect good compliance.  Current diet order is heart healthy/carbohydrate modified. Pt reports having a good appetite currently and PTA with no other difficulties. Labs and medications reviewed. No further nutrition interventions warranted at this time. RD contact information provided. If additional nutrition issues arise, please re-consult RD.  Corrin Parker, MS, RD, LDN Pager # [phone] After hours/ weekend pager # [phone]

## 2017-07-23 NOTE — Progress Notes (Signed)
Inpatient Rehabilitation  Notified that Medicare A & B is primary and [place] is secondary.  Met with patient to discuss team's recommendation for IP Rehab.  Shared booklets and answered questions.  Patient is eager to regain her independence and participate in IP Rehab.  I have received medical clearance and will proceed with admitting patient later today.  Nicoletta Dress nurse case manager.  Please call with questions.   Carmelia Roller., CCC/SLP Admission Coordinator  [place]  Cell [phone]

## 2017-07-24 ENCOUNTER — Inpatient Hospital Stay (HOSPITAL_COMMUNITY): Payer: Self-pay | Admitting: Occupational Therapy

## 2017-07-24 ENCOUNTER — Inpatient Hospital Stay (HOSPITAL_COMMUNITY): Payer: Medicare Other | Admitting: Speech Pathology

## 2017-07-24 ENCOUNTER — Inpatient Hospital Stay (HOSPITAL_COMMUNITY): Payer: Medicare Other

## 2017-07-24 ENCOUNTER — Inpatient Hospital Stay (HOSPITAL_COMMUNITY): Payer: Self-pay | Admitting: Physical Therapy

## 2017-07-24 DIAGNOSIS — M542 Cervicalgia: Secondary | ICD-10-CM

## 2017-07-24 LAB — COMPREHENSIVE METABOLIC PANEL
ALBUMIN: 3.5 g/dL (ref 3.5–5.0)
ALT: 17 U/L (ref 14–54)
AST: 24 U/L (ref 15–41)
Alkaline Phosphatase: 73 U/L (ref 38–126)
Anion gap: 7 (ref 5–15)
BILIRUBIN TOTAL: 0.6 mg/dL (ref 0.3–1.2)
BUN: 18 mg/dL (ref 6–20)
CHLORIDE: 106 mmol/L (ref 101–111)
CO2: 26 mmol/L (ref 22–32)
Calcium: 8.7 mg/dL — ABNORMAL LOW (ref 8.9–10.3)
Creatinine, Ser: 1.65 mg/dL — ABNORMAL HIGH (ref 0.44–1.00)
GFR calc Af Amer: 37 mL/min — ABNORMAL LOW (ref >60)
GFR calc non Af Amer: 32 mL/min — ABNORMAL LOW (ref >60)
GLUCOSE: 95 mg/dL (ref 65–99)
POTASSIUM: 4.4 mmol/L (ref 3.5–5.1)
Sodium: 139 mmol/L (ref 135–145)
Total Protein: 6.6 g/dL (ref 6.5–8.1)

## 2017-07-24 LAB — CBC WITH DIFFERENTIAL/PLATELET
BASOS ABS: 0 10*3/uL (ref 0.0–0.1)
BASOS PCT: 0 %
Eosinophils Absolute: 0.3 10*3/uL (ref 0.0–0.7)
Eosinophils Relative: 4 %
HEMATOCRIT: 39.8 % (ref 36.0–46.0)
Hemoglobin: 12.4 g/dL (ref 12.0–15.0)
Lymphocytes Relative: 33 %
Lymphs Abs: 3 10*3/uL (ref 0.7–4.0)
MCH: 28 pg (ref 26.0–34.0)
MCHC: 31.2 g/dL (ref 30.0–36.0)
MCV: 89.8 fL (ref 78.0–100.0)
MONO ABS: 0.6 10*3/uL (ref 0.1–1.0)
Monocytes Relative: 7 %
NEUTROS ABS: 5 10*3/uL (ref 1.7–7.7)
Neutrophils Relative %: 56 %
PLATELETS: 280 10*3/uL (ref 150–400)
RBC: 4.43 MIL/uL (ref 3.87–5.11)
RDW: 15.3 % (ref 11.5–15.5)
WBC: 9 10*3/uL (ref 4.0–10.5)

## 2017-07-24 LAB — GLUCOSE, CAPILLARY
GLUCOSE-CAPILLARY: 111 mg/dL — AB (ref 65–99)
GLUCOSE-CAPILLARY: 148 mg/dL — AB (ref 65–99)
Glucose-Capillary: 79 mg/dL (ref 65–99)
Glucose-Capillary: 180 mg/dL — ABNORMAL HIGH (ref 65–99)

## 2017-07-24 MED ORDER — TROLAMINE SALICYLATE 10 % EX CREA
TOPICAL_CREAM | Freq: Two times a day (BID) | CUTANEOUS | Status: DC | PRN
  Filled 2017-07-24: qty 85

## 2017-07-24 MED ORDER — GABAPENTIN 400 MG PO CAPS
700.0000 mg | ORAL_CAPSULE | Freq: Two times a day (BID) | ORAL | Status: DC
  Administered 2017-07-24 – 2017-08-02 (×18): 700 mg via ORAL
  Filled 2017-07-24 (×18): qty 1

## 2017-07-24 NOTE — Evaluation (Signed)
Speech Language Pathology Assessment and Plan  Patient Details  Name: Tara Hopkins MRN: [id number] Date of Birth: Sep 27, 1951  SLP Diagnosis: Speech and Language deficits  Rehab Potential: Good ELOS: 1 week    Today's Date: 07/24/2017 SLP Individual Time: 8329-1916 SLP Individual Time Calculation (min): 60 min   Problem List:  Patient Active Problem List   Diagnosis Date Noted  . Stroke (cerebrum) ([place]) 07/23/2017  . Subcortical infarction ([place])   . Hyperlipidemia 07/22/2017  . Mild tetrahydrocannabinol (THC) abuse   . Smoker   . CVA (cerebral vascular accident) ([place]) 07/21/2017  . Ischemic stroke ([place]) 07/21/2017  . Diabetes mellitus with complication ([place]) 60/60/0459  . Slurred speech 07/21/2017  . Bipolar disorder ([place]) 09/09/2015  . Left hemiparesis ([place]) 02/20/2015  . Cocaine abuse   . Stroke ([place]) 02/15/2015  . Osteomyelitis of foot, right, acute ([place]) 02/23/2014  . Status post transmetatarsal amputation of right foot ([place]) 12/15/2013  . DM type 2 with diabetic peripheral neuropathy ([place]) 05/21/2013  . Essential hypertension 05/21/2013  . Depression 05/21/2013  . Anemia 05/21/2013  . Renal insufficiency 05/21/2013   Past Medical History:  Past Medical History:  Diagnosis Date  . Anemia   . Anxiety   . Bipolar 1 disorder ([place])   . Cataracts, bilateral   . Depression   . Diabetes mellitus without complication ([place])   . Glaucoma   . History of blood transfusion   . History of hyperbaric oxygen therapy    pt reports 80 treatments.  . Hypercholesteremia   . Hypertension   . Peripheral neuropathy   . Stroke [facility]) 02/2015   Past Surgical History:  Past Surgical History:  Procedure Laterality Date  . AMPUTATION Right 12/15/2013   Procedure: Right Transmetatarsal Amputation;  Surgeon: Newt Minion, MD;  Location: [place];  Service: Orthopedics;  Laterality: Right;  . AMPUTATION Right 02/23/2014   Procedure: AMPUTATION FOOT;  Surgeon: Newt Minion, MD;   Location: [place];  Service: Orthopedics;  Laterality: Right;  Right Foot Revision Transmetatarsal Amputation  . I&D EXTREMITY Right 05/23/2013   Procedure: IRRIGATION AND DEBRIDEMENT RIGHT GREAT TOE;  Surgeon: Mauri Pole, MD;  Location[facility];  Service: Orthopedics;  Laterality: Right;    Assessment / Plan / Recommendation Clinical Impression Tara Hopkins is a 66 y.o. female with history of HTN, T2DM with peripheral neuropathy, bipolar disorder, polysubstance abuse, CVA with residual L-HP--CIR stay, who was admitted on 07/21/17 with worsening of left sided weakness. MRI brain revealed acute/subacute infarct in right posterior limb of internal capsule and moderate parenchymal volume loss of brain with moderate SVD. MRA brain/neck showed intracranial stenosis affecting left PCA with high grade stenosis P2 branches, 40% stenosis L-ICA, 60% stensis L-SA and bilateral high grade vertebral origin stenosis. 2D echo showed EF 55-60% with no wall abnormality and grade 2 diastolic dysfunction. UDS positive for THC and patient admitted to using cocaine 3-4 weeks ago. Neurology recommended changing ASA to plavix and starting high intensity statin. Therapy evaluations done yesterday and CIR recommended due to deficits in mobility and was admitted on 07/23/17.   Pt with history of baseline cognitive impairments and most recently discharged from CIR on at Mod I level. At time of discharge, it was recommend pt receive Mod I to supervision level of support. Per pt report, she has ex husband, grandchildren and god children who provide for all of her needs. Despite this level of help, pt continues to demonstrate poor pyschosocial decision making such as engaging in  drug abuse. Pt's cognitive abilities appear close to baseline. However, looking towards discharge, pt needs to demonstrate anticipatory safety awareness within a Mod I to supervision level of support to prevent rehospitalization. Pt's speech intelligibility  is also reduced d/t imprecise articulation, fast rate and differences in resononance. Pt achieves ~ 75% intelligibility at the sentence level. Short course of skilled ST recommended to target speech intelligibility, anticipatory awareness and semi-complex problem solving to reduce rehospitalization, increase functional independence and reduce caregiver burden.    Skilled Therapeutic Interventions          Skilled treatment session focused on completion of speech-language evaluation, see above. PT appears close to baseline but her speech intelligibility is reduced and her anticipatory awarness is is not at supervision to Mod I level.    SLP Assessment  Patient will need skilled [facility] during CIR admission    Recommendations  Recommendations for Other Services: Neuropsych consult Patient destination: Home Follow up Recommendations: 24 hour supervision/assistance;Home Health SLP Equipment Recommended: None recommended by SLP    SLP Frequency 3 to 5 out of 7 days   SLP Duration  SLP Intensity  SLP Treatment/Interventions 1 week  Minumum of 1-2 x/day, 30 to 90 minutes  Cognitive remediation/compensation;Environmental controls;Patient/family education;Speech/Language facilitation    Pain Pain Assessment Pain Assessment: 0-10 Pain Score: 10-Worst pain ever Pain Type: Acute pain Pain Location: Shoulder Pain Orientation: Right Pain Descriptors / Indicators: Aching Pain Onset: On-going Pain Intervention(s): Medication (See eMAR)  Prior Functioning Cognitive/Linguistic Baseline: Baseline deficits Baseline deficit details: Pt with baseline deficits from previous CVA Type of Home: House  Lives With: Alone Available Help at Discharge: Family Vocation: On disability  Function:    Cognition Comprehension Comprehension assist level: Follows basic conversation/direction with extra time/assistive device  Expression   Expression assist level: Expresses basic  needs/ideas: With extra time/assistive device  Social Interaction Social Interaction assist level: Interacts appropriately with others with medication or extra time (anti-anxiety, antidepressant).  Problem Solving Problem solving assist level: Solves basic 90% of the time/requires cueing < 10% of the time;Solves basic problems with no assist  Memory Memory assist level: Recognizes or recalls 75 - 89% of the time/requires cueing 10 - 24% of the time;Recognizes or recalls 90% of the time/requires cueing < 10% of the time   Short Term Goals: Week 1: SLP Short Term Goal 1 (Week 1): Pt will demonstrate anticipatory awareness by listing 3 activities that are safe for pt to participate in within home environment.  SLP Short Term Goal 2 (Week 1): Pt will solve mildly complex problems related to daily living with supervision to Mod I cues.  SLP Short Term Goal 3 (Week 1): Pt will implement speech intelligibility strategies to achieve ~90% at the simple conversation level.   Refer to Care Plan for Long Term Goals  Recommendations for other services: Neuropsych  Discharge Criteria: Patient will be discharged from SLP if patient refuses treatment 3 consecutive times without medical reason, if treatment goals not met, if there is a change in medical status, if patient makes no progress towards goals or if patient is discharged from hospital.  The above assessment, treatment plan, treatment alternatives and goals were discussed and mutually agreed upon: by patient  Haneef Hallquist 07/24/2017, 11:10 AM

## 2017-07-24 NOTE — Progress Notes (Signed)
Subjective/Complaints: Pt tearful this am, sad about her weakness Also c/o neck pain shooting down R arm Has been txed for CTS as OP, scheduled for CTS injection ROS- no CP, SOB, N/V/D Objective: Vital Signs: Blood pressure (!) 143/78, pulse (!) 56, temperature 98.3 F (36.8 C), temperature source Oral, resp. rate 16, SpO2 98 %. No results found. Results for orders placed or performed during the hospital encounter of 07/23/17 (from the past 72 hour(s))  Glucose, capillary     Status: None   Collection Time: 07/23/17  5:14 PM  Result Value Ref Range   Glucose-Capillary 98 65 - 99 mg/dL  Glucose, capillary     Status: Abnormal   Collection Time: 07/23/17  9:26 PM  Result Value Ref Range   Glucose-Capillary 120 (H) 65 - 99 mg/dL   Comment 1 Notify RN   CBC WITH DIFFERENTIAL     Status: None   Collection Time: 07/24/17  5:43 AM  Result Value Ref Range   WBC 9.0 4.0 - 10.5 K/uL   RBC 4.43 3.87 - 5.11 MIL/uL   Hemoglobin 12.4 12.0 - 15.0 g/dL   HCT 39.8 36.0 - 46.0 %   MCV 89.8 78.0 - 100.0 fL   MCH 28.0 26.0 - 34.0 pg   MCHC 31.2 30.0 - 36.0 g/dL   RDW 15.3 11.5 - 15.5 %   Platelets 280 150 - 400 K/uL   Neutrophils Relative % 56 %   Neutro Abs 5.0 1.7 - 7.7 K/uL   Lymphocytes Relative 33 %   Lymphs Abs 3.0 0.7 - 4.0 K/uL   Monocytes Relative 7 %   Monocytes Absolute 0.6 0.1 - 1.0 K/uL   Eosinophils Relative 4 %   Eosinophils Absolute 0.3 0.0 - 0.7 K/uL   Basophils Relative 0 %   Basophils Absolute 0.0 0.0 - 0.1 K/uL  Comprehensive metabolic panel     Status: Abnormal   Collection Time: 07/24/17  5:43 AM  Result Value Ref Range   Sodium 139 135 - 145 mmol/L   Potassium 4.4 3.5 - 5.1 mmol/L   Chloride 106 101 - 111 mmol/L   CO2 26 22 - 32 mmol/L   Glucose, Bld 95 65 - 99 mg/dL   BUN 18 6 - 20 mg/dL   Creatinine, Ser 1.65 (H) 0.44 - 1.00 mg/dL   Calcium 8.7 (L) 8.9 - 10.3 mg/dL   Total Protein 6.6 6.5 - 8.1 g/dL   Albumin 3.5 3.5 - 5.0 g/dL   AST 24 15 - 41 U/L   ALT  17 14 - 54 U/L   Alkaline Phosphatase 73 38 - 126 U/L   Total Bilirubin 0.6 0.3 - 1.2 mg/dL   GFR calc non Af Amer 32 (L) >60 mL/min   GFR calc Af Amer 37 (L) >60 mL/min    Comment: (NOTE) The eGFR has been calculated using the CKD EPI equation. This calculation has not been validated in all clinical situations. eGFR's persistently <60 mL/min signify possible Chronic Kidney Disease.    Anion gap 7 5 - 15  Glucose, capillary     Status: None   Collection Time: 07/24/17  6:41 AM  Result Value Ref Range   Glucose-Capillary 79 65 - 99 mg/dL   Comment 1 Notify RN      HEENT: mildly reduced neck ROM has 3/4 nl ROM, neg Spurlng's Cardio: RRR and no murmur Resp: CTA B/L and unlabored GI: BS positive and non tender non distended Extremity:  Pulses positive and No Edema  Skin:   Intact Neuro: Alert/Oriented, Normal Sensory, Abnormal Motor 4/r R delt, bi, tri, grip, 3/5 Left delt Bi tri grip, 5/5 RLE , 4/5 LLE and Abnormal FMC Ataxic/ dec FMC Musc/Skel:  Other Right TMA Gen NAD, emotionally labile   Assessment/Plan: 1. 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 Physiatrist is providing close team supervision and 24 hour management of active medical problems listed below. Physiatrist and rehab team continue to assess barriers to discharge/monitor patient progress toward functional and medical goals. FIM:                   Function - Comprehension Comprehension: Auditory Comprehension assist level: Follows basic conversation/direction with extra time/assistive device  Function - Expression Expression: Verbal Expression assist level: Expresses basic needs/ideas: With extra time/assistive device           Medical Problem List and Plan:  1. 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 DVT  Prophylaxis/Anticoagulation: Pharmaceutical: Lovenox  3. Pain Management: tylenol prn  4. Mood: LCSW to follow for evaluation and support.  5. Neuropsych: This patient is capable of making decisions on her own behalf.  6. Skin/Wound Care: Continue pressure relief measures.  7. Fluids/Electrolytes/Nutrition: Encourage fluid intake. Check lytes in am.  8. HTN: Monitor BP bid --will continue to hold lisinopril to allow for adequate perfusion another 2-3 days  9. T2DM: Poorly controlled with Hgb A1C- 9.2. Monitor BS ac/hs. Continue lantus 15 units. Resume amaryl. Monitor for hypoglycemia CBG (last 3)   Recent Labs  07/23/17 1714 07/23/17 2126 07/24/17 0641  GLUCAP 98 120* 79    10. Acute on chronic renal failure?: Baseline SCr- 1.3- 1.4.now 1.65 --discontinue IVF.  11. H/o bipolar disorder: has been managed with klonopin tid.  12.Anemia resolved: Hgb normal now12.4gm 13. Peripheral neuropathy: based on worsening of renal status pharmacy consult rec reducing  gabapentin 1200 mg tid to 758m BID  14. Substance abuse: team to have further conversations regarding drug use. Set up with appropriate counseling after discharge  15.  Neck pain radiating to LUE, RUE weakness may be related to chronic R CR infarct, also may have cervical radic, check neck Xray, warm Kpad, sportscreme and tramadol LOS (Days) 1 A FACE TO FACE EVALUATION WAS PERFORMED  Tara Hopkins 07/24/2017, 8:37 AM

## 2017-07-24 NOTE — Evaluation (Signed)
Occupational Therapy Assessment and Plan  Patient Details  Name: Tara Hopkins MRN: [id number] Date of Birth: April 15, 1951  OT Diagnosis: abnormal posture, hemiplegia affecting non-dominant side, muscle weakness (generalized) and coordination disorder Rehab Potential: Rehab Potential (ACUTE ONLY): Excellent ELOS: 2-2.5 weeks   Today's Date: 07/24/2017 OT Individual Time: 5573-2202 OT Individual Time Calculation (min): 78 min     Problem List:  Patient Active Problem List   Diagnosis Date Noted  . Stroke (cerebrum) ([place]) 07/23/2017  . Subcortical infarction ([place])   . Hyperlipidemia 07/22/2017  . Mild tetrahydrocannabinol (THC) abuse   . Smoker   . CVA (cerebral vascular accident) ([name]) 07/21/2017  . Ischemic stroke ([place]) 07/21/2017  . Diabetes mellitus with complication ([place]) 54/27/0623  . Slurred speech 07/21/2017  . Bipolar disorder ([place]) 09/09/2015  . Left hemiparesis ([place]) 02/20/2015  . Cocaine abuse   . Stroke ([place]) 02/15/2015  . Osteomyelitis of foot, right, acute ([place]) 02/23/2014  . 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 DM type 2 with diabetic peripheral neuropathy ([place]) 05/21/2013  . Essential hypertension 05/21/2013  . Depression 05/21/2013  . Anemia 05/21/2013  . Renal insufficiency 05/21/2013    Past Medical History:  Past Medical History:  Diagnosis Date  . Anemia   . Anxiety   . Bipolar 1 disorder ([place])   . Cataracts, bilateral   . Depression   . Diabetes mellitus without complication ([place])   . Glaucoma   . History of blood transfusion   . History of hyperbaric oxygen therapy    pt reports 80 treatments.  . Hypercholesteremia   . Hypertension   . Peripheral neuropathy   . Stroke [facility]) 02/2015   Past Surgical History:  Past Surgical History:  Procedure Laterality Date  . AMPUTATION Right 12/15/2013   Procedure: Right Transmetatarsal Amputation;  Surgeon: Newt Minion, MD;  Location: [facility];  Service:  Orthopedics;  Laterality: Right;  . AMPUTATION Right 02/23/2014   Procedure: AMPUTATION FOOT;  Surgeon: Newt Minion, MD;  Location: [facility];  Service: Orthopedics;  Laterality: Right;  Right Foot Revision Transmetatarsal Amputation  . I&D EXTREMITY Right 05/23/2013   Procedure: IRRIGATION AND DEBRIDEMENT RIGHT GREAT TOE;  Surgeon: Mauri Pole, MD;  Location: [facility];  Service: Orthopedics;  Laterality: Right;    Assessment & Plan Clinical Impression: Tara Hopkins is a 66 y.o. female with history of HTN, T2DM with peripheral neuropathy, bipolar disorder, polysubstance abuse, CVA with residual L-HP--CIR stay, who was admitted on 07/21/17 with worsening of left sided weakness. MRI brain revealed acute/subacute infarct in right posterior limb of internal capsule and moderate parenchymal volume loss of brain with moderate SVD. MRA brain/neck showed intracranial stenosis affecting left PCA with high grade stenosis P2 branches, 40% stenosis L-ICA, 60% stensis L-SA and bilateral high grade vertebral origin stenosis. 2D echo showed EF 55-60% with no wall abnormality and grade 2 diastolic dysfunction. UDS positive for THC and patient admitted to using cocaine 3-4 weeks ago. Neurology recommended changing ASA to plavix and starting high intensity statin. Therapy evaluations done yesterday and CIR recommended due to deficits in mobility.   Patient currently requires max with basic self-care skills secondary to muscle weakness, decreased cardiorespiratoy endurance, impaired timing and sequencing, unbalanced muscle activation, ataxia and decreased coordination, decreased problem solving and decreased safety awareness and decreased sitting balance, decreased standing balance, decreased postural control and hemiplegia.  Prior to hospitalization, patient could complete BADLs with min.  Patient will benefit from skilled intervention to increase  independence with basic self-care skills prior to discharge home with  supportive grandchildren/family.  Anticipate patient will require minimal physical assistance and follow up home health.  OT - End of Session Endurance Deficit: Yes OT Assessment Rehab Potential (ACUTE ONLY): Excellent OT Barriers to Discharge: Decreased caregiver support OT Patient demonstrates impairments in the following area(s): Balance;Safety;Cognition;Endurance;Motor OT Basic ADL's Functional Problem(s): Grooming;Bathing;Dressing;Toileting OT Advanced ADL's Functional Problem(s): Simple Meal Preparation OT Transfers Functional Problem(s): Toilet;Tub/Shower OT Plan OT Intensity: Minimum of 1-2 x/day, 45 to 90 minutes OT Frequency: 5 out of 7 days OT Duration/Estimated Length of Stay: 2-2.5 weeks OT Treatment/Interventions: Balance/vestibular training;Discharge planning;Functional electrical stimulation;Pain management;Therapeutic Activities;Self Care/advanced ADL retraining;UE/LE Coordination activities;Cognitive remediation/compensation;Disease mangement/prevention;Functional mobility training;Patient/family education;Skin care/wound managment;Therapeutic Exercise;Community reintegration;DME/adaptive equipment instruction;Neuromuscular re-education;Psychosocial support;Splinting/orthotics;UE/LE Strength taining/ROM;Wheelchair propulsion/positioning;Visual/perceptual remediation/compensation OT Self Feeding Anticipated Outcome(s): No goal OT Basic Self-Care Anticipated Outcome(s): Supervision-Min A OT Toileting Anticipated Outcome(s): Supervision  OT Bathroom Transfers Anticipated Outcome(s): Supervision  OT Recommendation Recommendations for Other Services: Neuropsych consult;Therapeutic Recreation consult Therapeutic Recreation Interventions: Pet therapy;Stress management Patient destination: Home Follow Up Recommendations: Home health OT Equipment Recommended: To be determined   Skilled Therapeutic Intervention Skilled OT session completed with focus on initial evaluation,  education on OT role/POC, and establishment of patient-centered goals.   Pt greeted supine in bed with RN present. Max A for supine<sit due to UB weakness and Lt hemiplegia. Stand pivot<w/c with Max A and cues for technique, and then pt transferred to shower bench with heavy use of grab bars, though was provided cues to power up with armrests. Pt bathed while seated with overall Mod A for lifting LEs into figure 4 position as needed. Slow and methodical [place] bilateral hands, however, with extra time, pt able to manipulate wash cloth wash UB/LB. Afterwards she transferred<w/c<EOB to proceed with dressing. Pt requiring overall Max A for LB tasks and sit<stands without DME. Pt intermittently losing balance  posteriorally during dynamic tasks, and required Mod A to recover. Oral care/grooming tasks completed w/c level at sink with significantly increased time due to decreased coordination/FMC speed. Also practiced stand pivot toilet transfers with elevated toilet (Mod A with grab bars). Pt with significant fatigue afterwards. Provided her with fitted w/c cushion for promoting upright tolerance. At end of session, oriented her to call bell. Pt was left with all needs within reach at time of departure.   She was teary during tx, verbalized frustration regarding her current functional level. OT provided support, encouragement, and therapeutic listening to ease psychosocial concerns. Noted medical hx of depression and bipolar disorder.   OT Evaluation Precautions/Restrictions  Precautions Precautions: Fall Precaution Comments: Right transmetatarsal amputation Restrictions Weight Bearing Restrictions: No General Chart Reviewed: Yes Family/Caregiver Present: No Pain Rt shoulder pain, RN aware Pain Assessment Pain Assessment: 0-10 Pain Score: Asleep Pain Type: Acute pain Pain Location: Shoulder Pain Orientation: Right Pain Descriptors / Indicators: Aching Pain Onset: On-going Pain Intervention(s):  Medication (See eMAR) Home Living/Prior Functioning Home Living Family/patient expects to be discharged to:: Private residence Living Arrangements: Alone Available Help at Discharge: Family, Available PRN/intermittently Type of Home: Apartment (family in process of moving her into this ADA apartment) Home Access: Level entry Home Layout: One level Bathroom Shower/Tub: Multimedia programmer: Handicapped height Bathroom Accessibility: Yes  Lives With: Alone (she reports family visits frequently to assist with ADLs/IADLs) IADL History Homemaking Responsibilities: No Current License: No Occupation: Retired Type of Occupation: Worked in Programmer, 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 Integrated L UE during functional tasks. Perception  Perception: Within Functional Limits Praxis Praxis: Intact Cognition Overall Cognitive Status: History of cognitive impairments - at baseline Arousal/Alertness: Awake/alert Orientation Level: Person;Place;Situation Person: Oriented Place: Oriented Situation: Oriented Year: 2018 Month: September Day of Week: Correct Memory: Impaired Memory Impairment: Decreased recall of new information Immediate Memory Recall: Sock;Blue;Bed Memory Recall: Sock;Blue;Bed Memory Recall Sock: With Cue Memory Recall Blue: With Cue Memory Recall Bed: With Cue Attention:  Sustained Sustained Attention: Appears intact Awareness: Impaired Awareness Impairment: Anticipatory impairment Problem Solving: Impaired Safety/Judgment: Impaired Sensation Sensation Light Touch: Appears Intact Stereognosis: Not tested Hot/Cold: Appears Intact Proprioception: Impaired Detail Proprioception Impaired Details: Impaired RUE;Impaired LUE Coordination Gross Motor Movements are Fluid and Coordinated: No Fine Motor Movements are Fluid and Coordinated: No Coordination and Movement Description: Pt with slow and methodical FM movements bilateral UEs L>R Finger Nose Finger Test:  (Dysmetria bilateral UEs) Motor  Motor Motor: Hemiplegia;Abnormal postural alignment and control Motor - Skilled Clinical Observations: Mild Lt lean in sitting, significant UB/LB weakness Mobility  Transfers Transfers: Sit to Stand;Stand to Sit Sit to Stand: 2: Max assist Sit to Stand Details: Verbal cues for technique;Verbal cues for sequencing;Manual facilitation for weight shifting Stand to Sit: 2: Max assist  Trunk/Postural Assessment  Cervical Assessment Cervical Assessment: Within Functional Limits Thoracic Assessment Thoracic Assessment: Exceptions to WFL (Lt lean EOB during LB dressing) Lumbar Assessment Lumbar Assessment: Exceptions to [facility] (posterior pelvic tilt) Postural Control Postural Control: Deficits on evaluation (Pt with several posterior LOBs EOB during dynamic dressing tasks)  Balance Balance Balance Assessed: Yes Dynamic Sitting Balance Dynamic Sitting - Level of Assistance: 4: Min assist Dynamic Standing Balance Dynamic Standing - Level of Assistance: 2: Max assist Extremity/Trunk Assessment RUE Assessment RUE Assessment: Exceptions to [facility] (3+/5 MMT, FMC deficits) LUE Assessment LUE Assessment: Exceptions to WFL (3/5 MMT, FMC deficits L>R)   See Function Navigator for Current Functional Status.   Refer to Care Plan for Long Term Goals  Recommendations for  other services: Neuropsych and Therapeutic Recreation  Pet therapy and Stress management   Discharge Criteria: Patient will be discharged from OT if patient refuses treatment 3 consecutive times without medical reason, if treatment goals not met, if there is a change in medical status, if patient makes no progress towards goals or if patient is discharged from hospital.  The above assessment, treatment plan, treatment alternatives and goals were discussed and mutually agreed upon: by patient  Skeet Simmer 07/24/2017, 9:19 PM

## 2017-07-24 NOTE — Evaluation (Signed)
Physical Therapy Assessment and Plan  Patient Details  Name: Tara Hopkins MRN: [id number] Date of Birth: 1951/11/08  PT Diagnosis: Abnormality of gait, Coordination disorder, Hemiplegia non-dominant and Muscle weakness Rehab Potential: Good ELOS: 14-18 days    Today's Date: 07/24/2017 PT Individual Time: 1300-1400 PT Individual Time Calculation (min): 60 min    Problem List:  Patient Active Problem List   Diagnosis Date Noted  . Stroke (cerebrum) ([place]) 07/23/2017  . Subcortical infarction ([place])   . Hyperlipidemia 07/22/2017  . Mild tetrahydrocannabinol (THC) abuse   . Smoker   . CVA (cerebral vascular accident) ([place]) 07/21/2017  . Ischemic stroke ([place]) 07/21/2017  . Diabetes mellitus with complication ([place]) 07/68/0881  . Slurred speech 07/21/2017  . Bipolar disorder ([place]) 09/09/2015  . Left hemiparesis ([place]) 02/20/2015  . Cocaine abuse   . Stroke ([place]) 02/15/2015  . Osteomyelitis of foot, right, acute ([place]) 02/23/2014  . Status post transmetatarsal amputation of right foot ([place]) 12/15/2013  . DM type 2 with diabetic peripheral neuropathy ([place]) 05/21/2013  . Essential hypertension 05/21/2013  . Depression 05/21/2013  . Anemia 05/21/2013  . Renal insufficiency 05/21/2013    Past Medical History:  Past Medical History:  Diagnosis Date  . Anemia   . Anxiety   . Bipolar 1 disorder ([place])   . Cataracts, bilateral   . Depression   . Diabetes mellitus without complication ([place])   . Glaucoma   . History of blood transfusion   . History of hyperbaric oxygen therapy    pt reports 80 treatments.  . Hypercholesteremia   . Hypertension   . Peripheral neuropathy   . Stroke [facility]) 02/2015   Past Surgical History:  Past Surgical History:  Procedure Laterality Date  . AMPUTATION Right 12/15/2013   Procedure: Right Transmetatarsal Amputation;  Surgeon: Newt Minion, MD;  Location: [facility];  Service: Orthopedics;  Laterality: Right;  . AMPUTATION Right 02/23/2014   Procedure:  AMPUTATION FOOT;  Surgeon: Newt Minion, MD;  Location: [facility];  Service: Orthopedics;  Laterality: Right;  Right Foot Revision Transmetatarsal Amputation  . I&D EXTREMITY Right 05/23/2013   Procedure: IRRIGATION AND DEBRIDEMENT RIGHT GREAT TOE;  Surgeon: Mauri Pole, MD;  Location: [facility];  Service: Orthopedics;  Laterality: Right;    Assessment & Plan Clinical Impression: Patient is a 66 y.o. female with history of HTN, T2DM with peripheral neuropathy, bipolar disorder, polysubstance abuse, CVA with residual L-HP--CIR stay, who was admitted on 07/21/17 with worsening of left sided weakness. MRI brain revealed acute/subacute infarct in right posterior limb of internal capsule and moderate parenchymal volume loss of brain with moderate SVD. MRA brain/neck showed intracranial stenosis affecting left PCA with high grade stenosis P2 branches, 40% stenosis L-ICA, 60% stensis L-SA and bilateral high grade vertebral origin stenosis. 2D echo showed EF 55-60% with no wall abnormality and grade 2 diastolic dysfunction. UDS positive for THC and patient admitted to using cocaine 3-4 weeks ago. Neurology recommended changing ASA to plavix and starting high intensity statin.   Patient transferred to CIR on 07/23/2017 .   Patient currently requires mod with mobility secondary to muscle weakness, impaired timing and sequencing, unbalanced muscle activation and decreased coordination, decreased attention to left and decreased standing balance, decreased postural control, hemiplegia and decreased balance strategies.  Prior to hospitalization, patient was supervision-Mod I  with mobility and lived with Alone, Friend(s) in a [place] home.  Home access is  Level entry.  Patient will benefit from skilled PT intervention to maximize safe  functional mobility, minimize fall risk and decrease caregiver burden for planned discharge home with 24 hour supervision.  Anticipate patient will benefit from follow up HH at  discharge.  PT - End of Session Activity Tolerance: Tolerates 10 - 20 min activity with multiple rests Endurance Deficit: Yes PT Assessment Rehab Potential (ACUTE/IP ONLY): Good PT Barriers to Discharge: Decreased caregiver support;Medical stability PT Patient demonstrates impairments in the following area(s): Balance;Behavior;Endurance;Motor;Nutrition;Perception;Safety;Skin Integrity;Sensory PT Transfers Functional Problem(s): Bed Mobility;Bed to Chair;Car;Furniture;Floor PT Locomotion Functional Problem(s): Ambulation;Wheelchair Mobility;Stairs PT Plan PT Intensity: Minimum of 1-2 x/day ,45 to 90 minutes PT Frequency: 5 out of 7 days PT Duration Estimated Length of Stay: 14-18 days  PT Treatment/Interventions: Ambulation/gait training;Balance/vestibular training;Cognitive remediation/compensation;Community reintegration;Discharge planning;Disease management/prevention;DME/adaptive equipment instruction;Functional electrical stimulation;Patient/family education;Functional mobility training;Pain management;Neuromuscular re-education;Skin care/wound management;Stair training;Psychosocial support;Therapeutic Activities;Splinting/orthotics;Therapeutic Exercise;Visual/perceptual remediation/compensation;UE/LE Coordination activities;UE/LE Strength taining/ROM;Wheelchair propulsion/positioning PT Transfers Anticipated Outcome(s): supervision assist with LRAD  PT Locomotion Anticipated Outcome(s): Supervision assist for household distances PT Recommendation Recommendations for Other Services: Therapeutic Recreation consult Therapeutic Recreation Interventions: Stress management Follow Up Recommendations: Home health PT Patient destination: Home Equipment Recommended: To be determined  Skilled Therapeutic Intervention Pt received supine in bed and agreeable to PT. Supine>sit transfer with min assist and min cues for safety. PT instructed patient in PT Evaluation and initiated treatment  intervention; see below for results. PT educated patient in POC, rehab potential, rehab goals, and discharge recommendations. Pt returned to room and performed stand pivot transfer to bed with min-mod assist. Sit>supine completed with min assist, and pt left supine in bed with call bell in reach and all needs met.       PT Evaluation General   Vital Signs Pain Pain Assessment Pain Assessment: 0-10 Pain Score: Asleep Pain Type: Acute pain Pain Location: Shoulder Pain Orientation: Right Pain Descriptors / Indicators: Aching Pain Onset: On-going Pain Intervention(s): Medication (See eMAR) Home Living/Prior Functioning Home Living Available Help at Discharge: Family;Available PRN/intermittently Type of Home: Apartment Home Access: Level entry Home Layout: One level Bathroom Shower/Tub: Walk-in shower (has lip) Bathroom Toilet: Handicapped height Bathroom Accessibility: Yes  Lives With: Alone;Friend(s) Prior Function Level of Independence: Needs assistance with ADLs;Needs assistance with homemaking;Requires assistive device for independence  Able to Take Stairs?: Yes Driving: No Vocation: Retired Vision/Perception  Vision - Assessment Additional Comments: mild inattention to the L. all others WFL Perception Perception: Impaired (mild L inattention) Praxis Praxis: Intact  Cognition Overall Cognitive Status: History of cognitive impairments - at baseline Arousal/Alertness: Awake/alert Orientation Level: Oriented X4 Attention: Sustained Sustained Attention: Appears intact Memory: Impaired (At baseline) Awareness: Impaired Awareness Impairment: Anticipatory impairment Safety/Judgment: Impaired Sensation Sensation Light Touch: Appears Intact Proprioception: Appears Intact Coordination Gross Motor Movements are Fluid and Coordinated: No Fine Motor Movements are Fluid and Coordinated: No Coordination and Movement Description: decreased speed and accurancy on the L UE and  LE Finger Nose Finger Test: delayed on the L Heel Shin Test: decreased smoothness of movement Motor  Motor Motor: Hemiplegia Motor - Skilled Clinical Observations: LUE and LLE hemiplegia  Mobility Bed Mobility Bed Mobility: Rolling Right;Rolling Left;Sit to Supine;Supine to Sit Rolling Right: 4: Min assist Rolling Left: 4: Min assist Supine to Sit: 4: Min assist Supine to Sit Details: Verbal cues for technique;Verbal cues for safe use of DME/AE;Verbal cues for precautions/safety Sit to Supine: 4: Min assist Sit to Supine - Details: Verbal cues for sequencing;Verbal cues for precautions/safety;Visual cues/gestures for sequencing Transfers Transfers: Yes Sit to Stand: 3: Mod assist;4: Min assist Stand to Sit: 4: Min   assist Stand Pivot Transfers: 4: Min assist;3: Mod assist Stand Pivot Transfer Details: Verbal cues for sequencing;Verbal cues for precautions/safety Stand Pivot Transfer Details (indicate cue type and reason): car transfer also completed with mod assist and UE support on rail of car, per pt choice. Squat Pivot Transfers: 4: Min assist;3: Mod Risk manager Details: Verbal cues for sequencing;Verbal cues for precautions/safety Locomotion  Ambulation Ambulation: Yes Ambulation/Gait Assistance: 3: Mod assist Ambulation Distance (Feet): 30 Feet Assistive device: Rolling walker Ambulation/Gait Assistance Details: Verbal cues for technique;Verbal cues for precautions/safety;Verbal cues for safe use of DME/AE;Verbal cues for gait pattern Gait Gait: Yes Gait Pattern: Decreased stance time - left;Decreased step length - right;Left genu recurvatum;Shuffle Stairs / Additional Locomotion Stairs: No Architect: Yes Wheelchair Assistance: 4: Hospital doctor: Both upper extremities Wheelchair Parts Management: Needs assistance Distance: 143f  Trunk/Postural Assessment  Cervical Assessment Cervical Assessment: Within  FScientist, physiologicalAssessment: Exceptions to WHoly [facility] Surgery Center LLCLumbar Assessment Lumbar Assessment:  (posterior pelvic tilt) Postural Control Postural Control: Deficits on evaluation (delayed in stance)  Balance Balance Balance Assessed: Yes Static Sitting Balance Static Sitting - Balance Support: No upper extremity supported Static Sitting - Level of Assistance: 5: Stand by assistance Dynamic Sitting Balance Dynamic Sitting - Level of Assistance: 4: Min assist Static Standing Balance Static Standing - Level of Assistance: 4: Min assist Dynamic Standing Balance Dynamic Standing - Level of Assistance: 3: Mod assist Extremity Assessment      RLE Assessment RLE Assessment: Within Functional Limits (R distal foot amputation) LLE Assessment LLE Assessment: Exceptions to WFL LLE Strength LLE Overall Strength Comments: ankle DF 1/5 all others tested 4+/5 to 5/5   See Function Navigator for Current Functional Status.   Refer to Care Plan for Long Term Goals  Recommendations for other services: Therapeutic Recreation  Stress management  Discharge Criteria: Patient will be discharged from PT if patient refuses treatment 3 consecutive times without medical reason, if treatment goals not met, if there is a change in medical status, if patient makes no progress towards goals or if patient is discharged from hospital.  The above assessment, treatment plan, treatment alternatives and goals were discussed and mutually agreed upon: by patient  ALorie Phenix9/15/2018, 2:01 PM

## 2017-07-25 ENCOUNTER — Inpatient Hospital Stay (HOSPITAL_COMMUNITY): Payer: Self-pay | Admitting: Occupational Therapy

## 2017-07-25 DIAGNOSIS — I6300 Cerebral infarction due to thrombosis of unspecified precerebral artery: Secondary | ICD-10-CM

## 2017-07-25 LAB — GLUCOSE, CAPILLARY
GLUCOSE-CAPILLARY: 48 mg/dL — AB (ref 65–99)
GLUCOSE-CAPILLARY: 88 mg/dL (ref 65–99)
Glucose-Capillary: 93 mg/dL (ref 65–99)
Glucose-Capillary: 105 mg/dL — ABNORMAL HIGH (ref 65–99)
Glucose-Capillary: 159 mg/dL — ABNORMAL HIGH (ref 65–99)

## 2017-07-25 MED ORDER — LISINOPRIL 5 MG PO TABS
5.0000 mg | ORAL_TABLET | Freq: Every day | ORAL | Status: DC
  Administered 2017-07-25 – 2017-07-28 (×4): 5 mg via ORAL
  Filled 2017-07-25 (×4): qty 1

## 2017-07-25 NOTE — Progress Notes (Signed)
Occupational Therapy Session Note  Patient Details  Name: Tara xxxChestnut MRN: [id number] Date of Birth: Nov 06, 1951  Today's Date: 07/25/2017 OT Individual Time: 1453-1540 OT Individual Time Calculation (min): 47 min    Short Term Goals: Week 1:  OT Short Term Goal 1 (Week 1): Pt will complete bathing with Min A  OT Short Term Goal 2 (Week 1): Pt will complete 1/3 components of donning pants OT Short Term Goal 3 (Week 1): Pt will don shirt with supervision   Skilled Therapeutic Interventions/Progress Updates:    Tx focus on hand coordination, endurance, and psychosocial wellness during meaningful leisure participation.   Pt greeted supine in bed, agreeable to tx. Declined B/D and grooming tasks. She completed supine<sit with Max A and stand pivot<w/c with RW and Mod A with cues for safe use of device and hand placement. She was then escorted to dayroom. Had her engage in coloring activity, able to exhibit weak Rt tripod grasp, stayed within lines and attended to Lt/Rt aspects of page. Pt turning page throughout session with extra time and bilateral UEs. She was teary, verbalized concern over whether she will be able to move into her new apartment (she has to sign the lease but no one has brought it to her). Pt verbalizing that family will not visit her this week because "they don't care." Provided her with therapeutic listening, support, and encouragement (also tissue box). Affect brightened when listening to [place]. At end of session she was escorted back to room and left with all needs within reach.   Therapy Documentation Precautions:  Precautions Precautions: Fall Precaution Comments: Right transmetatarsal amputation Restrictions Weight Bearing Restrictions: No Vital Signs: Therapy Vitals Temp: 98.1 F (36.7 C) Temp Source: Oral Pulse Rate: 60 Resp: 19 BP: (!) 190/95 (RN notified) Patient Position (if appropriate): Lying Oxygen Therapy SpO2: 100 % O2 Device:  Not Delivered Pain: Rt shoulder tightness/soreness after coloring. Subsided with rest.   ADL: ADL ADL Comments: Please see functional navigator for ADL status     See Function Navigator for Current Functional Status.   Therapy/Group: Individual Therapy  Donna Silverman A Alphonsa Brickle 07/25/2017, 4:21 PM

## 2017-07-25 NOTE — Significant Event (Signed)
Hypoglycemic Event  CBG: 48  Treatment: 15 GM carbohydrate snack  Symptoms: None  Follow-up CBG: Time:1214 CBG Result:88  Possible Reasons for Event: Inadequate meal intake  Comments/MD notified:Hypoglycemia protocol followed    Ehab Humber, Warnell Bureau

## 2017-07-25 NOTE — Plan of Care (Signed)
Problem: RH BOWEL ELIMINATION Goal: RH STG MANAGE BOWEL WITH ASSISTANCE STG Manage Bowel with MIn Assistance.  Outcome: Not Progressing Incontinent-max assist

## 2017-07-25 NOTE — Plan of Care (Signed)
Problem: RH SAFETY Goal: RH STG ADHERE TO SAFETY PRECAUTIONS W/ASSISTANCE/DEVICE STG Adhere to Safety Precautions With Min Assistance/Device.  Outcome: Not Progressing Attempted OOB without assistance.

## 2017-07-25 NOTE — Progress Notes (Signed)
Subjective/Complaints:  No issues overnite RN notes coughing during meals  Pt concerned about her lease, needs to sign and landlord reportedly not willing to have pt sign lease in hospital  Neck and R arm pain better  ROS- no CP, SOB, N/V/D Objective: Vital Signs: Blood pressure (!) 152/95, pulse 60, temperature 97.9 F (36.6 C), temperature source Oral, resp. rate 18, SpO2 98 %. Dg Cervical Spine 2 Or 3 Views  Result Date: 07/24/2017 CLINICAL DATA:  Right-sided neck pain x3 days. History of stroke 3 days ago with right-sided weakness. No injury or fall. EXAM: CERVICAL SPINE - 2-3 VIEW COMPARISON:  None. FINDINGS: There is no evidence of cervical spine fracture or prevertebral soft tissue swelling. Moderate multilevel bilateral facet arthropathy. Uncovertebral joint spurring is identified on the right at C5-6. The atlantodental interval is maintained. No jumped appearing facets. Alignment is normal. Moderate disc space narrowing C5-6 with anterior osteophytes. 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 No acute fracture or suspicious osseous lesions. Electronically Signed   By: Ashley Royalty M.D.   On: 07/24/2017 17:26   Results for orders placed or performed during the hospital encounter of 07/23/17 (from the past 72 hour(s))  Glucose, capillary     Status: None   Collection Time: 07/23/17  5:14 PM  Result Value Ref Range   Glucose-Capillary 98 65 - 99 mg/dL  Glucose, capillary     Status: Abnormal   Collection Time: 07/23/17  9:26 PM  Result Value Ref Range   Glucose-Capillary 120 (H) 65 - 99 mg/dL   Comment 1 Notify RN   CBC WITH DIFFERENTIAL     Status: None   Collection Time: 07/24/17  5:43 AM  Result Value Ref Range   WBC 9.0 4.0 - 10.5 K/uL   RBC 4.43 3.87 - 5.11 MIL/uL   Hemoglobin 12.4 12.0 - 15.0 g/dL   HCT 39.8 36.0 - 46.0 %   MCV 89.8 78.0 - 100.0 fL   MCH 28.0 26.0 - 34.0 pg   MCHC 31.2 30.0 - 36.0 g/dL    RDW 15.3 11.5 - 15.5 %   Platelets 280 150 - 400 K/uL   Neutrophils Relative % 56 %   Neutro Abs 5.0 1.7 - 7.7 K/uL   Lymphocytes Relative 33 %   Lymphs Abs 3.0 0.7 - 4.0 K/uL   Monocytes Relative 7 %   Monocytes Absolute 0.6 0.1 - 1.0 K/uL   Eosinophils Relative 4 %   Eosinophils Absolute 0.3 0.0 - 0.7 K/uL   Basophils Relative 0 %   Basophils Absolute 0.0 0.0 - 0.1 K/uL  Comprehensive metabolic panel     Status: Abnormal   Collection Time: 07/24/17  5:43 AM  Result Value Ref Range   Sodium 139 135 - 145 mmol/L   Potassium 4.4 3.5 - 5.1 mmol/L   Chloride 106 101 - 111 mmol/L   CO2 26 22 - 32 mmol/L   Glucose, Bld 95 65 - 99 mg/dL   BUN 18 6 - 20 mg/dL   Creatinine, Ser 1.65 (H) 0.44 - 1.00 mg/dL   Calcium 8.7 (L) 8.9 - 10.3 mg/dL   Total Protein 6.6 6.5 - 8.1 g/dL   Albumin 3.5 3.5 - 5.0 g/dL   AST 24 15 - 41 U/L   ALT 17 14 - 54 U/L   Alkaline Phosphatase 73 38 - 126 U/L   Total Bilirubin 0.6 0.3 - 1.2 mg/dL   GFR calc non Af Amer 32 (  L) >60 mL/min   GFR calc Af Amer 37 (L) >60 mL/min    Comment: (NOTE) The eGFR has been calculated using the CKD EPI equation. This calculation has not been validated in all clinical situations. eGFR's persistently <60 mL/min signify possible Chronic Kidney Disease.    Anion gap 7 5 - 15  Glucose, capillary     Status: None   Collection Time: 07/24/17  6:41 AM  Result Value Ref Range   Glucose-Capillary 79 65 - 99 mg/dL   Comment 1 Notify RN   Glucose, capillary     Status: Abnormal   Collection Time: 07/24/17 11:35 AM  Result Value Ref Range   Glucose-Capillary 180 (H) 65 - 99 mg/dL  Glucose, capillary     Status: Abnormal   Collection Time: 07/24/17  4:30 PM  Result Value Ref Range   Glucose-Capillary 111 (H) 65 - 99 mg/dL  Glucose, capillary     Status: Abnormal   Collection Time: 07/24/17  8:58 PM  Result Value Ref Range   Glucose-Capillary 148 (H) 65 - 99 mg/dL   Comment 1 Notify RN   Glucose, capillary     Status: None    Collection Time: 07/25/17  6:27 AM  Result Value Ref Range   Glucose-Capillary 93 65 - 99 mg/dL   Comment 1 Notify RN      HEENT: mildly reduced neck ROM has 3/4 nl ROM, neg Spurlng's Cardio: RRR and no murmur Resp: CTA B/L and unlabored GI: BS positive and non tender non distended Extremity:  Pulses positive and No Edema Skin:   Intact Neuro: Alert/Oriented, Normal Sensory, Abnormal Motor 4/r R delt, bi, tri, grip, 3/5 Left delt Bi tri grip, 5/5 RLE , 4/5 LLE and Abnormal FMC Ataxic/ dec FMC Musc/Skel:  Other Right TMA Gen NAD, emotionally labile   Assessment/Plan: 1. 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 Physiatrist is providing close team supervision and 24 hour management of active medical problems listed below. Physiatrist and rehab team continue to assess barriers to discharge/monitor patient progress toward functional and medical goals. FIM: Function - Bathing Position: Shower Body parts bathed by patient: Right arm, Left arm, Chest, Abdomen, Front perineal area, Right upper leg, Left upper leg Body parts bathed by helper: Buttocks, Right lower leg, Left lower leg, Back  Function- Upper Body Dressing/Undressing What is the patient wearing?: Pull over shirt/dress Pull over shirt/dress - Perfomed by patient: Thread/unthread right sleeve, Thread/unthread left sleeve, Put head through opening Pull over shirt/dress - Perfomed by helper: Pull shirt over trunk Assist Level: Touching or steadying assistance(Pt > 75%) Function - Lower Body Dressing/Undressing What is the patient wearing?: Underwear, Pants, Non-skid slipper socks Position: Sitting EOB Underwear - Performed by helper: Thread/unthread right underwear leg, Thread/unthread left underwear leg, Pull underwear up/down Pants- Performed by patient: Thread/unthread right pants leg, Thread/unthread  left pants leg, Pull pants up/down Non-skid slipper socks- Performed by helper: Don/doff right sock, Don/doff left sock Assist for lower body dressing:  (Total A)  Function - Toileting Toileting activity did not occur: No continent bowel/bladder event  Function - Air cabin crew transfer assistive device: Grab bar, Elevated toilet seat/BSC over toilet Assist level to toilet: Moderate assist (Pt 50 - 74%/lift or lower) Assist level from toilet: Moderate assist (Pt 50 - 74%/lift or lower)  Function - Chair/bed transfer Chair/bed transfer method: Stand pivot Chair/bed transfer assist level: Moderate  assist (Pt 50 - 74%/lift or lower) Chair/bed transfer assistive device: Armrests, Walker  Function - Locomotion: Wheelchair Type: Manual Max wheelchair distance: 160f  Assist Level: Touching or steadying assistance (Pt > 75%) Assist Level: Supervision or verbal cues Assist Level: Touching or steadying assistance (Pt > 75%) Function - Locomotion: Ambulation Assistive device: Walker-rolling Max distance: 31f Assist level: Moderate assist (Pt 50 - 74%) Assist level: Moderate assist (Pt 50 - 74%) Walk 50 feet with 2 turns activity did not occur: Safety/medical concerns Walk 150 feet activity did not occur: Safety/medical concerns Walk 10 feet on uneven surfaces activity did not occur: Safety/medical concerns  Function - Comprehension Comprehension: Auditory Comprehension assist level: Understands basic 90% of the time/cues < 10% of the time  Function - Expression Expression: Verbal Expression assist level: Expresses basic needs/ideas: With extra time/assistive device  Function - Social Interaction Social Interaction assist level: Interacts appropriately with others with medication or extra time (anti-anxiety, antidepressant).  Function - Problem Solving Problem solving assist level: Solves basic 90% of the time/requires cueing < 10% of the time  Function - Memory Memory  assist level: Recognizes or recalls 75 - 89% of the time/requires cueing 10 - 24% of the time Patient normally able to recall (first 3 days only): Current season, Location of own room, That he or she is in a hospital, Staff names and faces  Medical Problem List and Plan:  1. Functional deficits and left hemiparesis secondary to right PLIC infarct  -CIR PT, OT, SLP ask speech to eval swallow as  Well as cognition 2. DVT Prophylaxis/Anticoagulation: Pharmaceutical: Lovenox  3. Pain Management: tylenol prn  4. Mood: LCSW to follow for evaluation and support.  5. Neuropsych: This patient is capable of making decisions on her own behalf.  6. Skin/Wound Care: Continue pressure relief measures.  7. Fluids/Electrolytes/Nutrition: Encourage fluid intake. Check lytes in am.  8. HTN: Monitor BP bid --will continue to hold lisinopril to allow for adequate perfusion another 2-3 days  9. T2DM: Poorly controlled with Hgb A1C- 9.2. Monitor BS ac/hs. Continue lantus 15 units. Resume amaryl. Monitor for hypoglycemia CBG (last 3)   Recent Labs  07/24/17 1630 07/24/17 2058 07/25/17 0627  GLUCAP 111* 148* 93    10. Acute on chronic renal failure?: Baseline SCr- 1.3- 1.4.now 1.65 --discontinue IVF.  11. H/o bipolar disorder: has been managed with klonopin tid.  12.Anemia resolved: Hgb normal now12.4gm 13. Peripheral neuropathy: based on worsening of renal status pharmacy consult rec reducing  gabapentin 1200 mg tid to [id number]  14. Substance abuse: team to have further conversations regarding drug use. Set up with appropriate counseling after discharge  15.  Neck pain radiating to LUE, RUE weakness may be related to chronic R CR infarct, also may have cervical radic,discussed  neck Xray, warm Kpad, sportscreme and tramadol LOS (Days) 2 A FACE TO FACE EVALUATION WAS PERFORMED  KIRSTEINS,ANDREW E 07/25/2017, 8:16 AM

## 2017-07-26 ENCOUNTER — Telehealth: Payer: Self-pay | Admitting: Adult Health

## 2017-07-26 ENCOUNTER — Inpatient Hospital Stay (HOSPITAL_COMMUNITY): Payer: Self-pay | Admitting: Occupational Therapy

## 2017-07-26 ENCOUNTER — Inpatient Hospital Stay (HOSPITAL_COMMUNITY): Payer: Self-pay | Admitting: Physical Therapy

## 2017-07-26 ENCOUNTER — Inpatient Hospital Stay (HOSPITAL_COMMUNITY): Payer: Medicare Other | Admitting: Speech Pathology

## 2017-07-26 ENCOUNTER — Inpatient Hospital Stay (HOSPITAL_COMMUNITY): Payer: Medicare Other

## 2017-07-26 DIAGNOSIS — R269 Unspecified abnormalities of gait and mobility: Secondary | ICD-10-CM

## 2017-07-26 DIAGNOSIS — I69398 Other sequelae of cerebral infarction: Secondary | ICD-10-CM

## 2017-07-26 LAB — GLUCOSE, CAPILLARY
Glucose-Capillary: 106 mg/dL — ABNORMAL HIGH (ref 65–99)
Glucose-Capillary: 101 mg/dL — ABNORMAL HIGH (ref 65–99)
Glucose-Capillary: 139 mg/dL — ABNORMAL HIGH (ref 65–99)
Glucose-Capillary: 230 mg/dL — ABNORMAL HIGH (ref 65–99)

## 2017-07-26 NOTE — Progress Notes (Signed)
Physical Therapy Session Note  Patient Details  Name: Tara Hopkins MRN: [id number] Date of Birth: [birth year]  Today's Date: 07/26/2017 PT Individual Time: 0626-9485 PT Individual Time Calculation (min): 30 min   Short Term Goals: Week 1:  PT Short Term Goal 1 (Week 1): pt will transfer with consistent min assist and LRAD PT Short Term Goal 2 (Week 1): pt will ambulate 38' with min assist  PT Short Term Goal 3 (Week 1): pt will propel w/c 150 with min assist  Skilled Therapeutic Interventions/Progress Updates:    Session focused on NMR to address transfers, postural control, and balance during functional mobility. Squat pivot transfers with extra time and mod assist for clearance and cues for activation through BLE and technique. Sit <> stand retraining with RW with focus on achieving full upright position with mod assist and verbal cues for hand placement. Side stepping R and L and progressed to stand step transfer with cues for foot placement, positioning of RW, and maintaining upright posture. Pt demonstrates decreased recall of activities throughout day (worked on similar activities with previous PT and unable to recall that she walked with therapy this weekend). Stair negotiation training initiated for home entry practice on 3" step with rails with min assist and cues for sequencing. Attempted gait, but pt unable to maintain upright standing (question physical fatigue vs. Mental block as pt repeated before "I can't walk yet."). Returned to room via w/c total assist and pt became tearful stating "this is so much harder this time." emotional support provided and all needs in reach.   Therapy Documentation Precautions:  Precautions Precautions: Fall Precaution Comments: Right transmetatarsal amputation Restrictions Weight Bearing Restrictions: No  Pain: Denies pain.   See Function Navigator for Current Functional Status.   Therapy/Group: Individual Therapy  Canary Brim  Ivory Broad, PT, DPT  07/26/2017, 12:01 PM

## 2017-07-26 NOTE — Progress Notes (Signed)
Tearful and upset at change of shift, attempting to get OOB. "Everybody's mad at me." Reports, I'm just feeling sorry for myself." Spent 15 minutes talking with patient, providing emotional support. Scheduled Klonipin given. No further complaints during night.Patrici Ranks A
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
## 2017-07-26 NOTE — Progress Notes (Signed)
Subjective/Complaints:  Pt worried that she needs to sign lease for new apt, wants a pass to do this or someone to call the landlord  ROS- no CP, SOB, N/V/D Objective: Vital Signs: Blood pressure (!) 159/85, pulse (!) 58, temperature 98 F (36.7 C), temperature source Oral, resp. rate 16, SpO2 100 %. Dg Cervical Spine 2 Or 3 Views  Result Date: 07/24/2017 CLINICAL DATA:  Right-sided neck pain x3 days. History of stroke 3 days ago with right-sided weakness. No injury or fall. EXAM: CERVICAL SPINE - 2-3 VIEW COMPARISON:  None. FINDINGS: There is no evidence of cervical spine fracture or prevertebral soft tissue swelling. Moderate multilevel bilateral facet arthropathy. Uncovertebral joint spurring is identified on the right at C5-6. The atlantodental interval is maintained. No jumped appearing facets. Alignment is normal. Moderate disc space narrowing C5-6 with anterior osteophytes. 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 No acute fracture or suspicious osseous lesions. Electronically Signed   By: Ashley Royalty M.D.   On: 07/24/2017 17:26   Results for orders placed or performed during the hospital encounter of 07/23/17 (from the past 72 hour(s))  Glucose, capillary     Status: None   Collection Time: 07/23/17  5:14 PM  Result Value Ref Range   Glucose-Capillary 98 65 - 99 mg/dL  Glucose, capillary     Status: Abnormal   Collection Time: 07/23/17  9:26 PM  Result Value Ref Range   Glucose-Capillary 120 (H) 65 - 99 mg/dL   Comment 1 Notify RN   CBC WITH DIFFERENTIAL     Status: None   Collection Time: 07/24/17  5:43 AM  Result Value Ref Range   WBC 9.0 4.0 - 10.5 K/uL   RBC 4.43 3.87 - 5.11 MIL/uL   Hemoglobin 12.4 12.0 - 15.0 g/dL   HCT 39.8 36.0 - 46.0 %   MCV 89.8 78.0 - 100.0 fL   MCH 28.0 26.0 - 34.0 pg   MCHC 31.2 30.0 - 36.0 g/dL   RDW 15.3 11.5 - 15.5 %   Platelets 280 150 - 400 K/uL   Neutrophils Relative % 56  %   Neutro Abs 5.0 1.7 - 7.7 K/uL   Lymphocytes Relative 33 %   Lymphs Abs 3.0 0.7 - 4.0 K/uL   Monocytes Relative 7 %   Monocytes Absolute 0.6 0.1 - 1.0 K/uL   Eosinophils Relative 4 %   Eosinophils Absolute 0.3 0.0 - 0.7 K/uL   Basophils Relative 0 %   Basophils Absolute 0.0 0.0 - 0.1 K/uL  Comprehensive metabolic panel     Status: Abnormal   Collection Time: 07/24/17  5:43 AM  Result Value Ref Range   Sodium 139 135 - 145 mmol/L   Potassium 4.4 3.5 - 5.1 mmol/L   Chloride 106 101 - 111 mmol/L   CO2 26 22 - 32 mmol/L   Glucose, Bld 95 65 - 99 mg/dL   BUN 18 6 - 20 mg/dL   Creatinine, Ser 1.65 (H) 0.44 - 1.00 mg/dL   Calcium 8.7 (L) 8.9 - 10.3 mg/dL   Total Protein 6.6 6.5 - 8.1 g/dL   Albumin 3.5 3.5 - 5.0 g/dL   AST 24 15 - 41 U/L   ALT 17 14 - 54 U/L   Alkaline Phosphatase 73 38 - 126 U/L   Total Bilirubin 0.6 0.3 - 1.2 mg/dL   GFR calc non Af Amer 32 (L) >60 mL/min   GFR calc Af Amer 37 (L) >60  mL/min    Comment: (NOTE) The eGFR has been calculated using the CKD EPI equation. This calculation has not been validated in all clinical situations. eGFR's persistently <60 mL/min signify possible Chronic Kidney Disease.    Anion gap 7 5 - 15  Glucose, capillary     Status: None   Collection Time: 07/24/17  6:41 AM  Result Value Ref Range   Glucose-Capillary 79 65 - 99 mg/dL   Comment 1 Notify RN   Glucose, capillary     Status: Abnormal   Collection Time: 07/24/17 11:35 AM  Result Value Ref Range   Glucose-Capillary 180 (H) 65 - 99 mg/dL  Glucose, capillary     Status: Abnormal   Collection Time: 07/24/17  4:30 PM  Result Value Ref Range   Glucose-Capillary 111 (H) 65 - 99 mg/dL  Glucose, capillary     Status: Abnormal   Collection Time: 07/24/17  8:58 PM  Result Value Ref Range   Glucose-Capillary 148 (H) 65 - 99 mg/dL   Comment 1 Notify RN   Glucose, capillary     Status: None   Collection Time: 07/25/17  6:27 AM  Result Value Ref Range   Glucose-Capillary 93  65 - 99 mg/dL   Comment 1 Notify RN   Glucose, capillary     Status: Abnormal   Collection Time: 07/25/17 11:32 AM  Result Value Ref Range   Glucose-Capillary 48 (L) 65 - 99 mg/dL  Glucose, capillary     Status: None   Collection Time: 07/25/17 12:14 PM  Result Value Ref Range   Glucose-Capillary 88 65 - 99 mg/dL  Glucose, capillary     Status: Abnormal   Collection Time: 07/25/17  4:46 PM  Result Value Ref Range   Glucose-Capillary 105 (H) 65 - 99 mg/dL  Glucose, capillary     Status: Abnormal   Collection Time: 07/25/17  7:48 PM  Result Value Ref Range   Glucose-Capillary 159 (H) 65 - 99 mg/dL   Comment 1 Notify RN   Glucose, capillary     Status: Abnormal   Collection Time: 07/26/17  6:35 AM  Result Value Ref Range   Glucose-Capillary 106 (H) 65 - 99 mg/dL   Comment 1 Notify RN      HEENT: mildly reduced neck ROM has 3/4 nl ROM, neg Spurlng's Cardio: RRR and no murmur Resp: CTA B/L and unlabored GI: BS positive and non tender non distended Extremity:  Pulses positive and No Edema Skin:   Intact Neuro: Alert/Oriented, Normal Sensory, Abnormal Motor 4/r R delt, bi, tri, grip, 3/5 Left delt Bi tri grip, 5/5 RLE , 4/5 LLE and Abnormal FMC Ataxic/ dec FMC Musc/Skel:  Other Right TMA Gen NAD, emotionally labile   Assessment/Plan: 1. 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 Physiatrist is providing close team supervision and 24 hour management of active medical problems listed below. Physiatrist and rehab team continue to assess barriers to discharge/monitor patient progress toward functional and medical goals. FIM: Function - Bathing Position: Shower Body parts bathed by patient: Right arm, Left arm, Chest, Abdomen, Front perineal area, Right upper leg, Left upper leg Body parts bathed by helper: Buttocks, Right lower leg, Left lower leg,  Back  Function- Upper Body Dressing/Undressing What is the patient wearing?: Pull over shirt/dress Pull over shirt/dress - Perfomed by patient: Thread/unthread right sleeve, Thread/unthread left sleeve, Put head through opening Pull over  shirt/dress - Perfomed by helper: Pull shirt over trunk Assist Level: Touching or steadying assistance(Pt > 75%) Function - Lower Body Dressing/Undressing What is the patient wearing?: Underwear, Pants, Non-skid slipper socks Position: Sitting EOB Underwear - Performed by helper: Thread/unthread right underwear leg, Thread/unthread left underwear leg, Pull underwear up/down Pants- Performed by patient: Thread/unthread right pants leg, Thread/unthread left pants leg, Pull pants up/down Non-skid slipper socks- Performed by helper: Don/doff right sock, Don/doff left sock Assist for lower body dressing:  (Total A)  Function - Toileting Toileting activity did not occur: No continent bowel/bladder event Toileting steps completed by patient: Performs perineal hygiene Toileting steps completed by helper: Adjust clothing prior to toileting, Adjust clothing after toileting Assist level: Two helpers  Function - Air cabin crew transfer assistive device: Grab bar Assist level to toilet: Moderate assist (Pt 50 - 74%/lift or lower) Assist level from toilet: Moderate assist (Pt 50 - 74%/lift or lower)  Function - Chair/bed transfer Chair/bed transfer method: Stand pivot Chair/bed transfer assist level: Moderate assist (Pt 50 - 74%/lift or lower) Chair/bed transfer assistive device: Armrests, Walker  Function - Locomotion: Wheelchair Type: Manual Max wheelchair distance: 176f  Assist Level: Touching or steadying assistance (Pt > 75%) Assist Level: Supervision or verbal cues Assist Level: Touching or steadying assistance (Pt > 75%) Function - Locomotion: Ambulation Assistive device: Walker-rolling Max distance: 317f Assist level: Moderate assist (Pt  50 - 74%) Assist level: Moderate assist (Pt 50 - 74%) Walk 50 feet with 2 turns activity did not occur: Safety/medical concerns Walk 150 feet activity did not occur: Safety/medical concerns Walk 10 feet on uneven surfaces activity did not occur: Safety/medical concerns  Function - Comprehension Comprehension: Auditory Comprehension assist level: Understands basic 90% of the time/cues < 10% of the time  Function - Expression Expression: Verbal Expression assist level: Expresses basic needs/ideas: With extra time/assistive device  Function - Social Interaction Social Interaction assist level: Interacts appropriately with others with medication or extra time (anti-anxiety, antidepressant).  Function - Problem Solving Problem solving assist level: Solves basic 90% of the time/requires cueing < 10% of the time  Function - Memory Memory assist level: Recognizes or recalls 75 - 89% of the time/requires cueing 10 - 24% of the time Patient normally able to recall (first 3 days only): Current season, Location of own room, That he or she is in a hospital, Staff names and faces  Medical Problem List and Plan:  1. 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 DVT Prophylaxis/Anticoagulation: Pharmaceutical: Lovenox  3. Pain Management: tylenol prn  4. Mood: LCSW to follow for evaluation and support.  5. Neuropsych: This patient is capable of making decisions on her own behalf.  6. Skin/Wound Care: Continue pressure relief measures.  7. Fluids/Electrolytes/Nutrition: Encourage fluid intake. Check lytes in am.  8. HTN: Monitor BP bid --Restarted prinivil at 48m8mmay need to increase (home dose 13m66mitals:   07/25/17 1400 07/26/17 0500  BP: (!) 190/95 (!) 159/85  Pulse: 60 (!) 58  Resp: 19 16  Temp: 98.1 F (36.7 C) 98 F (36.7 C)  SpO2: 100% 100%   9. T2DM: Poorly controlled with Hgb A1C-  9.2. Monitor BS ac/hs. Continue lantus 15 units. Resume amaryl. Monitor for hypoglycemia CBG (last 3)   Recent Labs  07/25/17 1646 07/25/17 1948 07/26/17 0635  GLUCAP 105* 159* 106*  Controlled 9/17  10. Acute on chronic  renal failure?: Baseline SCr- 1.3- 1.4.now 1.65, enc fluids --11. H/o bipolar disorder: has been managed with klonopin tid.   11. Peripheral neuropathy: based on worsening of renal status pharmacy consult rec reducing  gabapentin 1200 mg tid to 763m BID  12. Substance abuse: team to have further conversations regarding drug use. Set up with appropriate counseling after discharge  13.  Neck pain improved  warm Kpad, sportscreme and tramadol LOS (Days) 3 A FACE TO FACE EVALUATION WAS PERFORMED  KIRSTEINS,ANDREW E 07/26/2017, 6:57 AM

## 2017-07-26 NOTE — IPOC Note (Addendum)
Overall Plan of Care [facility]) Patient Details Name: Tara Hopkins MRN: [id number] DOB: 1951/11/06  Admitting Diagnosis: <principal problem not specified>  Hospital Problems: Active Problems:   Stroke (cerebrum) ([place])   Subcortical infarction ([place])     Functional Problem List: Nursing Endurance, Nutrition, Safety  PT Balance, Behavior, Endurance, Motor, Nutrition, Perception, Safety, Skin Integrity, Sensory  OT Balance, Safety, Cognition, Endurance, Motor  SLP Safety, Motor  TR         Basic ADL's: OT Grooming, Bathing, Dressing, Toileting     Advanced  ADL's: OT Simple Meal Preparation     Transfers: PT Bed Mobility, Bed to Chair, Car, Furniture, Floor  OT Toilet, Metallurgist: PT Ambulation, Emergency planning/management officer, Stairs     Additional Impairments: OT    SLP Communication, Social Cognition expression Problem Solving, Awareness  TR      Anticipated Outcomes Item Anticipated Outcome  Self Feeding No goal  Swallowing      Basic self-care  Supervision-Min A  Comptroller Transfers Supervision   Bowel/Bladder  Patient will continue to be continent of bowel and bladder during admission  Transfers  supervision assist with LRAD   Locomotion  Supervision assist for household distances  Communication  Mod I  Cognition  Supervision to Mod I  Pain  Patient will be pain free or pain less than 3 during admission  Safety/Judgment  Patient will be free from falls and adhere to safety plan   Therapy Plan: PT Intensity: Minimum of 1-2 x/day ,45 to 90 minutes PT Frequency: 5 out of 7 days PT Duration Estimated Length of Stay: 14-18 days  OT Intensity: Minimum of 1-2 x/day, 45 to 90 minutes OT Frequency: 5 out of 7 days OT Duration/Estimated Length of Stay: 2-2.5 weeks SLP Intensity: Minumum of 1-2 x/day, 30 to 90 minutes SLP Frequency: 3 to 5 out of 7 days SLP Duration/Estimated Length of Stay: 1 week    Team  Interventions: Nursing Interventions Patient/Family Education, Disease Management/Prevention, Medication Management, Psychosocial Support  PT interventions Ambulation/gait training, Training and development officer, Cognitive remediation/compensation, Community reintegration, Discharge planning, Disease management/prevention, DME/adaptive equipment instruction, Functional electrical stimulation, Patient/family education, Functional mobility training, Pain management, Neuromuscular re-education, Skin care/wound management, Stair training, Psychosocial support, Therapeutic Activities, Splinting/orthotics, Therapeutic Exercise, Visual/perceptual remediation/compensation, UE/LE Coordination activities, UE/LE Strength taining/ROM, Wheelchair propulsion/positioning  OT Interventions Training and development officer, Discharge planning, Functional electrical stimulation, Pain management, Therapeutic Activities, Self Care/advanced ADL retraining, UE/LE Coordination activities, Cognitive remediation/compensation, Disease mangement/prevention, Functional mobility training, Patient/family education, Skin care/wound managment, Therapeutic Exercise, Community reintegration, Engineer, drilling, Neuromuscular re-education, Psychosocial support, Splinting/orthotics, UE/LE Strength taining/ROM, Wheelchair propulsion/positioning, Visual/perceptual remediation/compensation  SLP Interventions Cognitive remediation/compensation, Environmental controls, Patient/family education, Speech/Language facilitation  TR Interventions    SW/CM Interventions Discharge Planning, Psychosocial Support, Patient/Family Education   Barriers to Discharge MD  Medical stability and Medication compliance  Nursing      PT Decreased caregiver support, Medical stability    OT Decreased caregiver support    SLP Decreased caregiver support, Medication compliance history of polysubstance abuse  SW       Team Discharge  Planning: Destination: PT-Home ,OT- Home , SLP-Home Projected Follow-up: PT-Home health PT, OT-  24 hour supervision/assistance, SLP-24 hour supervision/assistance, Home Health SLP Projected Equipment Needs: PT-To be determined, OT- To be determined, SLP-None recommended by SLP Equipment Details: PT- , OT-  Patient/family involved in discharge planning: PT- Patient,  OT-Patient, SLP-Patient  MD ELOS: 7-11d Medical Rehab Prognosis:  Good Assessment:  66 y.o. female with history of HTN, T2DM with peripheral neuropathy, bipolar disorder, polysubstance abuse, CVA with residual L-HP--CIR stay, who was admitted on 07/21/17 with worsening of left sided weakness. MRI brain revealed acute/subacute infarct in right posterior limb of internal capsule and moderate parenchymal volume loss of brain with moderate SVD. MRA brain/neck showed intracranial stenosis affecting left PCA with high grade stenosis P2 branches, 40% stenosis L-ICA, 60% stensis L-SA and bilateral high grade vertebral origin stenosis. 2D echo showed EF 55-60% with no wall abnormality and grade 2 diastolic dysfunction. UDS positive for THC and patient admitted to using cocaine 3-4 weeks ago. Neurology recommended changing ASA to plavix and starting high intensity statin   Now requiring 24/7 Rehab RN,MD, as well as CIR level PT, OT and SLP.  Treatment team will focus on ADLs and mobility with goals set at [place] See Team Conference Notes for weekly updates to the plan of care

## 2017-07-26 NOTE — Progress Notes (Signed)
Occupational Therapy Session Note  Patient Details  Name: Tara Hopkins xxxChestnut MRN: [id number] Date of Birth: Apr 03, 1951  Today's Date: 07/26/2017 OT Individual Time: 1100-1215 OT Individual Time Calculation (min): 75 min   Short Term Goals: Week 1:  OT Short Term Goal 1 (Week 1): Pt will complete bathing with Min A  OT Short Term Goal 2 (Week 1): Pt will complete 1/3 components of donning pants OT Short Term Goal 3 (Week 1): Pt will don shirt with supervision   Skilled Therapeutic Interventions/Progress Updates:    Pt greeted seated in wc, tearful about her current situation. Pt states "I am just feeling sorry for myself today." Provided therapeutic listening and emotional support to encourage pt. B UE coordination with wc propulsion 1/2 way to laundry room. Worked in sit<>stand, standing balance, and functional use of L UE with loading top loader washer in standing. Pt needed mod A to power up to stand and L knee block. B UE strengthening with [employer] arm bike. Pt competed 10 mins on level 1 with intermittent bouts of just using L UE to focus on L UE strengthening. Standing balance/endurance and L hand strengthening with graded clothes-pin task in standing frame.  Addressed L hand strengthening/coordination with thera-putty and fine motor home program. Worked in in-hand manipulation, translation, and rotation. Pt left seated in wc at end of session with needs met and call bell in hand.   Therapy Documentation Precautions:  Precautions Precautions: Fall Precaution Comments: Right transmetatarsal amputation Restrictions Weight Bearing Restrictions: No Pain:  none/denies pain ADL: ADL ADL Comments: Please see functional navigator for ADL status  See Function Navigator for Current Functional Status.  Therapy/Group: Individual Therapy  Valma Cava 07/26/2017, 11:37 AM

## 2017-07-26 NOTE — Telephone Encounter (Signed)
Social Worker called to state that the patient has been hospitalized and is currently in rehab. Patient states "no information should be shared with anyone"

## 2017-07-26 NOTE — Progress Notes (Signed)
[facility] Individual Statement of Services  Patient Name:  Connie Lasater  Date:  07/26/2017  Welcome to the [facility].  Our goal is to provide you with an individualized program based on your diagnosis and situation, designed to meet your specific needs.  With this comprehensive rehabilitation program, you will be expected to participate in at least 3 hours of rehabilitation therapies Monday-Friday, with modified therapy programming on the weekends.  Your rehabilitation program will include the following services:  Physical Therapy (PT), Occupational Therapy (OT), Speech Therapy (ST), 24 hour per day rehabilitation nursing, Neuropsychology, Case Management (Social Worker), Rehabilitation Medicine, Nutrition Services and Pharmacy Services  Weekly team conferences will be held on Wednesdays to discuss your progress.  Your Social Worker will talk with you frequently to get your input and to update you on team discussions.  Team conferences with you and your family in attendance may also be held.  Expected length of stay:  2 to 2 1/2 weeks  Overall anticipated outcome:  Supervision with minimal assistance for lower body dressing and stairs  Depending on your progress and recovery, your program may change. Your Social Worker will coordinate services and will keep you informed of any changes. Your Social Worker's name and contact numbers are listed  below.  The following services may also be recommended but are not provided by the [facility] will be made to provide these services after discharge if needed.  Arrangements include referral to agencies that provide these services.  Your insurance has been verified to be:  Medicare and [employer] Your primary doctor is:  [name], NP  Pertinent information  will be shared with your doctor and your insurance company.  Social Worker:  Alfonse Alpers, LCSW  [phone] or [phone]  Information discussed with and copy given to patient by: Trey Sailors, 07/26/2017, 6:26 PM

## 2017-07-26 NOTE — Progress Notes (Signed)
Physical Therapy Session Note  Patient Details  Name: Tara Hopkins MRN: [id number] Date of Birth: 16-Apr-1951  Today's Date: 07/26/2017 PT Individual Time: 0900-0956 PT Individual Time Calculation (min): 56 min   Short Term Goals: Week 1:  PT Short Term Goal 1 (Week 1): pt will transfer with consistent min assist and LRAD PT Short Term Goal 2 (Week 1): pt will ambulate 12' with min assist  PT Short Term Goal 3 (Week 1): pt will propel w/c 150 with min assist  Skilled Therapeutic Interventions/Progress Updates:    no c/o pain.  Session focus on postural re-ed with functional mobility.    Pt transfers supine>sit with mod assist to maintain forward weight shift.  Squat/pivot to L with mod/max assist and mod verbal cues.  Stand/pivot to therapy mat on R with RW and mod assist and increased time to come to full standing.  Repeated sit<>stand with RW focus on achieving full upright and midline posture.  Pt with posterior/left lean in standing.  Repeated sit<>stand with 3" wedge, mod assist to stand and mod/max for static standing balance.  When wedge removed, pt able to perform sit<>stand with mod assist and maintain standing balance with min assist fade to supervision with mod cues for posture and midline orientation.  PT instructed pt in BLE therex with 2# ankle weights for 10-15 reps LAQ and hip flexion, min cues for pacing and technique.  Pt propels w/c x70' back towards room with BUEs and RLE, increased time and supervision.  Positioned upright with call bell in reach and needs met.   Therapy Documentation Precautions:  Precautions Precautions: Fall Precaution Comments: Right transmetatarsal amputation Restrictions Weight Bearing Restrictions: No   See Function Navigator for Current Functional Status.   Therapy/Group: Individual Therapy  Michel Santee 07/26/2017, 9:31 AM

## 2017-07-26 NOTE — Progress Notes (Signed)
Speech Language Pathology Daily Session Note  Patient Details  Name: Tara Hopkins MRN: [id number] Date of Birth: 1951-04-02  Today's Date: 07/26/2017 SLP Individual Time: 1300-1330 SLP Individual Time Calculation (min): 30 min  Short Term Goals: Week 1: SLP Short Term Goal 1 (Week 1): Pt will demonstrate anticipatory awareness by listing 3 activities that are safe for pt to participate in within home environment.  SLP Short Term Goal 2 (Week 1): Pt will solve mildly complex problems related to daily living with supervision to Mod I cues.  SLP Short Term Goal 3 (Week 1): Pt will implement speech intelligibility strategies to achieve ~90% at the simple conversation level.   Skilled Therapeutic Interventions: Skilled treatment session focused on cognitive goals. SLP facilitated session by providing Mod A verbal cues for recall of her current medications and their functions and Min A verbal cues for problem solving while organizing a BID pill box.  Patient left upright in wheelchair with all needs within reach. Continue with current plan of care.      Function:  Cognition Comprehension Comprehension assist level: Understands basic 90% of the time/cues < 10% of the time  Expression   Expression assist level: Expresses basic 75 - 89% of the time/requires cueing 10 - 24% of the time. Needs helper to occlude trach/needs to repeat words.  Social Interaction Social Interaction assist level: Interacts appropriately with others with medication or extra time (anti-anxiety, antidepressant).  Problem Solving Problem solving assist level: Solves basic 90% of the time/requires cueing < 10% of the time  Memory Memory assist level: Recognizes or recalls 75 - 89% of the time/requires cueing 10 - 24% of the time    Pain Pain Assessment Pain Assessment: No/denies pain  Therapy/Group: Individual Therapy  Jayan Raymundo 07/26/2017, 3:33 PM

## 2017-07-26 NOTE — Telephone Encounter (Signed)
Noted  

## 2017-07-26 NOTE — Progress Notes (Signed)
Patient information reviewed and entered into eRehab system by Xavior Niazi, RN, CRRN, PPS Coordinator.  Information including medical coding and functional independence measure will be reviewed and updated through discharge.     Per nursing patient was given "Data Collection Information Summary for Patients in Inpatient Rehabilitation Facilities with attached "Privacy Act Statement-Health Care Records" upon admission.  

## 2017-07-27 ENCOUNTER — Inpatient Hospital Stay (HOSPITAL_COMMUNITY): Payer: Medicare Other | Admitting: Physical Therapy

## 2017-07-27 ENCOUNTER — Inpatient Hospital Stay (HOSPITAL_COMMUNITY): Payer: Self-pay | Admitting: Occupational Therapy

## 2017-07-27 ENCOUNTER — Inpatient Hospital Stay (HOSPITAL_COMMUNITY): Payer: Self-pay | Admitting: Speech Pathology

## 2017-07-27 ENCOUNTER — Inpatient Hospital Stay (HOSPITAL_COMMUNITY): Payer: Medicare Other | Admitting: Speech Pathology

## 2017-07-27 ENCOUNTER — Inpatient Hospital Stay (HOSPITAL_COMMUNITY): Payer: Self-pay | Admitting: Physical Therapy

## 2017-07-27 ENCOUNTER — Encounter (HOSPITAL_COMMUNITY): Payer: Self-pay | Admitting: Psychology

## 2017-07-27 LAB — GLUCOSE, CAPILLARY
GLUCOSE-CAPILLARY: 215 mg/dL — AB (ref 65–99)
GLUCOSE-CAPILLARY: 183 mg/dL — AB (ref 65–99)
Glucose-Capillary: 191 mg/dL — ABNORMAL HIGH (ref 65–99)
Glucose-Capillary: 161 mg/dL — ABNORMAL HIGH (ref 65–99)

## 2017-07-27 NOTE — Progress Notes (Signed)
Occupational Therapy Session Note  Patient Details  Name: Tara Hopkins MRN: [id number] Date of Birth: 07/25/1951  Today's Date: 07/27/2017 OT Individual Time: 0352-4818 OT Individual Time Calculation (min): 85 min   Short Term Goals: Week 1:  OT Short Term Goal 1 (Week 1): Pt will complete bathing with Min A  OT Short Term Goal 2 (Week 1): Pt will complete 1/3 components of donning pants OT Short Term Goal 3 (Week 1): Pt will don shirt with supervision   Skilled Therapeutic Interventions/Progress Updates:    Pt tearful upon OT arrival. Frustrated that her "arms don't work" and feels she is not getting better. Utilized therapeutic use of self and discussed progress patient has made since admission then redirected pt to dressing tasks. Reviewed hemi-techniques for dressing and assistance needed to orient shirt and fully push L arm through shirt sleeve. Incorporated forced use of L UE with min verbal cues to integrate. Sit<>stand with mod A overall and L knee block to power up. Facilitation to achieve full upright posture while reaching to advance pants over hips. Overall mod A to maintain standing balance during functional task.  B UE coordination with seated grooming task of toothbrushing and donning deodorant. Pt completed squat pivot transfers w/c<>EOM with Mod A to facilitate pivot.  Pt scooted L and R on mat with Min A to facilitate lift.  Pt completed functional reaching activity with LUE to improve gross motor control and proximal strength.  Min A and VCing to use 3-jaw chuck prehension pattern to place clothespins on middle rod.  Pt tossed ball back and forth to improve bilateral integration and incorporation of LUE into functional task.  Mod A to facilitate L hand placement and coordination when tossing ball. Pt returned to room and worked on B UE coordination with functional laundry folding task at [place]. Continued with L UE fine motor coordination activities with medium soft pink  thera-putty. Focus on finger rotation, pinch strength, and weight bearing. Pt left seated in wc at end of session with lunch tray set-up and needs met.   Therapy Documentation Precautions:  Precautions Precautions: Fall Precaution Comments: Right transmetatarsal amputation Restrictions Weight Bearing Restrictions: No Pain: Pain Assessment Pain Assessment: No/denies pain Pain Score: 0-No pain ADL: ADL ADL Comments: Please see functional navigator for ADL status  See Function Navigator for Current Functional Status.   Therapy/Group: Individual Therapy  Valma Cava 07/27/2017, 11:28 AM

## 2017-07-27 NOTE — Progress Notes (Signed)
Speech Language Pathology Daily Session Note  Patient Details  Name: Tara Hopkins MRN: [id number] Date of Birth: 1951-09-12  Today's Date: 07/27/2017 SLP Individual Time: 0735-0800 SLP Individual Time Calculation (min): 25 min  Short Term Goals: Week 1: SLP Short Term Goal 1 (Week 1): Pt will demonstrate anticipatory awareness by listing 3 activities that are safe for pt to participate in within home environment.  SLP Short Term Goal 2 (Week 1): Pt will solve mildly complex problems related to daily living with supervision to Mod I cues.  SLP Short Term Goal 3 (Week 1): Pt will implement speech intelligibility strategies to achieve ~90% at the simple conversation level.   Skilled Therapeutic Interventions: Skilled treatment session focused on cognitive goals. SLP facilitated session by providing supervision verbal cues for problem solving while completing the medication management task of organizing a BID pill box. SLP also provided Min A question cues for anticipatory awareness in regards to d/c planning and need for supervision due to physical deficits at discharge. Patient left upright in bed with all needs within reach. Continue with current plan of care.      Function:  Cognition Comprehension Comprehension assist level: Understands basic 90% of the time/cues < 10% of the time  Expression   Expression assist level: Expresses basic 75 - 89% of the time/requires cueing 10 - 24% of the time. Needs helper to occlude trach/needs to repeat words.  Social Interaction Social Interaction assist level: Interacts appropriately with others with medication or extra time (anti-anxiety, antidepressant).  Problem Solving Problem solving assist level: Solves basic 90% of the time/requires cueing < 10% of the time  Memory Memory assist level: Recognizes or recalls 75 - 89% of the time/requires cueing 10 - 24% of the time    Pain No/Denies Pain   Therapy/Group: Individual Therapy  [name],  [place] 07/27/2017, 2:16 PM

## 2017-07-27 NOTE — Consult Note (Signed)
Neuropsychological Consultation   Patient:   Tara Hopkins   DOB:   October 21, 1951  MR Number:  [id number]  Location:  [place] [address] [facility] [address] [id number] [place] [address] Dept: [place]: [phone]           Date of Service:   07/27/2017  Start Time:   1 PM End Time:   2 PM  Provider/Observer:  Ilean Skill, Psy.D.       Clinical Neuropsychologist       Billing Code/Service: [phone] 4 Units  Chief Complaint:    Tara Hopkins is a 66 year old female with history of HTN, T2DM with peripheral neuropath, bipolar disorder, polysubstance abuse and prior CVA.  Admitted on 07/21/2017 with worsening left sided weakness.  MRI acute/subacute infarct and moderate SVD.  Patient admits to using cocaine 3-4 weeks prior and UDS positive for THC.  Patient has prior history of depressive events likely related to Bipolar Disorder.  Patient is exprierincing depressive symptoms during rehab treatment.    Reason for Service:  Tara Hopkins was referred for neuropsychological consultation due to adjustment issues and possible exacerbation of long-standing issues with depressive events due to Bipolar Disorder.  Below is the HPI for the current admission.  HPI: Tara Hopkins is a 66 y.o. female with history of HTN, T2DM with peripheral neuropathy, bipolar disorder, polysubstance abuse, CVA with residual L-HP--CIR stay, who was admitted on 07/21/17 with worsening of left sided weakness. MRI brain revealed acute/subacute infarct in right posterior limb of internal capsule and moderate parenchymal volume loss of brain with moderate SVD. MRA brain/neck showed intracranial stenosis affecting left PCA with high grade stenosis P2 branches, 40% stenosis L-ICA, 60% stensis L-SA and bilateral high grade vertebral origin stenosis. 2D echo showed EF 55-60% with no wall abnormality and grade 2 diastolic dysfunction. UDS  positive for THC and patient admitted to using cocaine 3-4 weeks ago. Neurology recommended changing ASA to plavix and starting high intensity statin. Therapy evaluations done yesterday and CIR recommended due to deficits in mobility.   Current Status:  The patient reports that she is depressed and was tearful during the visit today.  Treatment staff also report periods of tearfulness as well.  The patient reports that her left leg (primary motor changes post stroke) is improving but still fearful of long-term limitations.  Behavioral Observation: Tara Hopkins  presents as a 66 y.o.-year-old Right African American Female who appeared her stated age. her dress was Appropriate and she was Well Groomed and her manners were Appropriate to the situation.  her participation was indicative of Appropriate and Attentive behaviors.  There were physical disabilities noted.  she displayed an appropriate level of cooperation and motivation.     Interactions:    Active Appropriate and Attentive  Attention:   abnormal and attention span appeared shorter than expected for age  Memory:   within normal limits; recent and remote memory intact  Visuo-spatial:  not examined  Speech (Volume):  low  Speech:   normal; normal  Thought Process:  Coherent and Relevant  Though Content:  WNL; not suicidal  Orientation:   person, place, time/date and situation  Judgment:   Fair  Planning:   Fair  Affect:    Depressed and Tearful  Mood:    Depressed and Dysphoric  Insight:   Fair  Intelligence:   high  Current Employment: Patient is disabled.  Past Employment:  Patient worked in [employer] for  [employer] system.  Substance Use:  There is a documented history of alcohol, cocaine and marijuana abuse confirmed by the patient and medical records..  The patient reports that she went 2 years after last hospitalization without using but friend provided cocaine 3-4 weeks ago and patient used.  She was also positive  for THC on UDS at admission.  Education:   Engineering geologist History:   Past Medical History:  Diagnosis Date  . Anemia   . Anxiety   . Bipolar 1 disorder ([place])   . Cataracts, bilateral   . Depression   . Diabetes mellitus without complication ([place])   . Glaucoma   . History of blood transfusion   . History of hyperbaric oxygen therapy    pt reports 80 treatments.  . Hypercholesteremia   . Hypertension   . Peripheral neuropathy   . Stroke [facility]) 02/2015        Psychiatric History:  Patient has prior medical history of bipolar disorder and episodes of depression.  Family Med/Psych History:  Family History  Problem Relation Age of Onset  . Liver cancer Mother   . Lung cancer Mother   . Depression Mother   . Hypertension Father   . Depression Father   . Colon cancer Neg Hx     Risk of Suicide/Violence: low Patient denies SI or HI.  Impression/DX:  Tara Hopkins is a 66 year old female with history of HTN, T2DM with peripheral neuropath, bipolar disorder, polysubstance abuse and prior CVA.  Admitted on 07/21/2017 with worsening left sided weakness.  MRI acute/subacute infarct and moderate SVD.  Patient admits to using cocaine 3-4 weeks prior and UDS positive for THC.  Patient has prior history of depressive events likely related to Bipolar Disorder.  Patient is exprierincing depressive symptoms during rehab treatment. The patient reports that she is depressed and was tearful during the visit today.  Treatment staff also report periods of tearfulness as well.  The patient reports that her left leg (primary motor changes post stroke) is improving but still fearful of long-term limitations.  We worked on coping responses and more adaptive skills to help cope with acute changes in motor function and on coping skills for dealing with current and future issues.  Disposition/Plan:  Will see the patient again next week if needed due to depressive symptoms.  Diagnosis:    Acute CVA,  history of Bipolar Disorder and Polysubstance abuse (limited over past 2 years).        Electronically Signed   _______________________ Ilean Skill, Psy.D.

## 2017-07-27 NOTE — Progress Notes (Signed)
Physical Therapy Session Note  Patient Details  Name: Tara Hopkins MRN: [id number] Date of Birth: July 28, 1951  Today's Date: 07/27/2017 PT Individual Time: 1415-1455 PT Individual Time Calculation (min): 40 min   Short Term Goals: Week 1:  PT Short Term Goal 1 (Week 1): pt will transfer with consistent min assist and LRAD PT Short Term Goal 2 (Week 1): pt will ambulate 33' with min assist  PT Short Term Goal 3 (Week 1): pt will propel w/c 150 with min assist  Skilled Therapeutic Interventions/Progress Updates:    no c/o pain.  Pt very tearful throughout session, requiring multiple rest breaks to calm down.  Session focus on emotional support, pt education, activity tolerance, and gait training.    Pt propelled w/c x100' with BUEs and increased time for mobility and activity tolerance.  Gait training on carpet surface with min assist to facilitate L<>R weight shift 2x20' with increased time to complete turns safely.  Pt upset throughout session, PT provided emotional support and pt education regarding goals of PT and progress towards goals over the last few days.  Pt returned to room at end of session, positioned upright in w/c with call bell in reach and needs met.   Therapy Documentation Precautions:  Precautions Precautions: Fall Precaution Comments: Right transmetatarsal amputation Restrictions Weight Bearing Restrictions: No   See Function Navigator for Current Functional Status.   Therapy/Group: Individual Therapy  Michel Santee 07/27/2017, 4:25 PM

## 2017-07-27 NOTE — Progress Notes (Signed)
Physical Therapy Session Note  Patient Details  Name: Tara Hopkins MRN: [id number] Date of Birth: Jul 25, 1951  Today's Date: 07/27/2017 PT Individual Time: 1003-1031 PT Individual Time Calculation (min): 28 min   Short Term Goals: Week 1:  PT Short Term Goal 1 (Week 1): pt will transfer with consistent min assist and LRAD PT Short Term Goal 2 (Week 1): pt will ambulate 34' with min assist  PT Short Term Goal 3 (Week 1): pt will propel w/c 150 with min assist  Skilled Therapeutic Interventions/Progress Updates:  Pt received asleep in bed but awakened with audio stimulation. Pt reported soreness in RUE on this date. Pt requires max assist for supine>sitting EOB with HOB elevated, bed rails, and manual facilitation for weight shifting to come to EOB. Pt washed face sitting EOB with set up assistance. Pt completed bed>w/c via stand pivot with manual facilitation for weight shifting and cuing for hand placement and sequencing. Transported pt to dayroom via w/c total assist for time management. Pt engaged in graded resistance clothespin activity with LUE reporting more difficulty with task on this date. At end of session pt returned to room & left sitting in w/c with all needs within reach. Pt becoming emotional over experiencing more difficulty with clothespin activity today & therapist provided encouragement & emotional support.   Therapy Documentation Precautions:  Precautions Precautions: Fall Precaution Comments: Right transmetatarsal amputation Restrictions Weight Bearing Restrictions: No   See Function Navigator for Current Functional Status.   Therapy/Group: Individual Therapy  Waunita Schooner 07/27/2017, 12:42 PM

## 2017-07-27 NOTE — Progress Notes (Signed)
Subjective/Complaints:  No issues overnite, pt slept ok, discussed her help at home, husband, friends and children   ROS- no CP, SOB, N/V/D Objective: Vital Signs: Blood pressure (!) 149/50, pulse 72, temperature 98.2 F (36.8 C), temperature source Oral, resp. rate 18, SpO2 95 %. No results found. Results for orders placed or performed during the hospital encounter of 07/23/17 (from the past 72 hour(s))  Glucose, capillary     Status: Abnormal   Collection Time: 07/24/17 11:35 AM  Result Value Ref Range   Glucose-Capillary 180 (H) 65 - 99 mg/dL  Glucose, capillary     Status: Abnormal   Collection Time: 07/24/17  4:30 PM  Result Value Ref Range   Glucose-Capillary 111 (H) 65 - 99 mg/dL  Glucose, capillary     Status: Abnormal   Collection Time: 07/24/17  8:58 PM  Result Value Ref Range   Glucose-Capillary 148 (H) 65 - 99 mg/dL   Comment 1 Notify RN   Glucose, capillary     Status: None   Collection Time: 07/25/17  6:27 AM  Result Value Ref Range   Glucose-Capillary 93 65 - 99 mg/dL   Comment 1 Notify RN   Glucose, capillary     Status: Abnormal   Collection Time: 07/25/17 11:32 AM  Result Value Ref Range   Glucose-Capillary 48 (L) 65 - 99 mg/dL  Glucose, capillary     Status: None   Collection Time: 07/25/17 12:14 PM  Result Value Ref Range   Glucose-Capillary 88 65 - 99 mg/dL  Glucose, capillary     Status: Abnormal   Collection Time: 07/25/17  4:46 PM  Result Value Ref Range   Glucose-Capillary 105 (H) 65 - 99 mg/dL  Glucose, capillary     Status: Abnormal   Collection Time: 07/25/17  7:48 PM  Result Value Ref Range   Glucose-Capillary 159 (H) 65 - 99 mg/dL   Comment 1 Notify RN   Glucose, capillary     Status: Abnormal   Collection Time: 07/26/17  6:35 AM  Result Value Ref Range   Glucose-Capillary 106 (H) 65 - 99 mg/dL   Comment 1 Notify RN   Glucose, capillary     Status: Abnormal   Collection Time: 07/26/17 12:02 PM  Result Value Ref Range   Glucose-Capillary 101 (H) 65 - 99 mg/dL  Glucose, capillary     Status: Abnormal   Collection Time: 07/26/17  5:04 PM  Result Value Ref Range   Glucose-Capillary 139 (H) 65 - 99 mg/dL  Glucose, capillary     Status: Abnormal   Collection Time: 07/26/17  8:21 PM  Result Value Ref Range   Glucose-Capillary 230 (H) 65 - 99 mg/dL   Comment 1 Notify RN   Glucose, capillary     Status: Abnormal   Collection Time: 07/27/17  6:46 AM  Result Value Ref Range   Glucose-Capillary 191 (H) 65 - 99 mg/dL     HEENT: mildly reduced neck ROM has 3/4 nl ROM, neg Spurlng's Cardio: RRR and no murmur Resp: CTA B/L and unlabored GI: BS positive and non tender non distended Extremity:  Pulses positive and No Edema Skin:   Intact Neuro: Alert/Oriented, Normal Sensory, Abnormal Motor 4/r R delt, bi, tri, grip, 3/5 Left delt Bi tri grip, 5/5 RLE , 4/5 LLE and Abnormal FMC Ataxic/ dec FMC Musc/Skel:  Other Right TMA Gen NAD, emotionally labile   Assessment/Plan: 1. 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 Physiatrist is providing close team supervision and 24 hour management of active medical problems listed below. Physiatrist and rehab team continue to assess barriers to discharge/monitor patient progress toward functional and medical goals. FIM: Function - Bathing Position: Shower Body parts bathed by patient: Right arm, Left arm, Chest, Abdomen, Front perineal area, Right upper leg, Left upper leg Body parts bathed by helper: Buttocks, Right lower leg, Left lower leg, Back  Function- Upper Body Dressing/Undressing What is the patient wearing?: Pull over shirt/dress Pull over shirt/dress - Perfomed by patient: Thread/unthread right sleeve, Thread/unthread left sleeve, Put head through opening Pull over shirt/dress - Perfomed by helper: Pull shirt over trunk Assist Level: Touching  or steadying assistance(Pt > 75%) Function - Lower Body Dressing/Undressing What is the patient wearing?: Non-skid slipper socks Position: Sitting EOB Underwear - Performed by helper: Thread/unthread right underwear leg, Thread/unthread left underwear leg, Pull underwear up/down Pants- Performed by helper: Thread/unthread right pants leg, Thread/unthread left pants leg, Pull pants up/down (wrong row per OT note) Non-skid slipper socks- Performed by helper: Don/doff left sock, Don/doff right sock Assist for footwear: Maximal assist Assist for lower body dressing: Touching or steadying assistance (Pt > 75%)  Function - Toileting Toileting activity did not occur: No continent bowel/bladder event Toileting steps completed by patient: Performs perineal hygiene Toileting steps completed by helper: Adjust clothing prior to toileting, Adjust clothing after toileting Assist level: Two helpers  Function - Air cabin crew transfer assistive device: Grab bar Assist level to toilet: Moderate assist (Pt 50 - 74%/lift or lower) Assist level from toilet: Moderate assist (Pt 50 - 74%/lift or lower)  Function - Chair/bed transfer Chair/bed transfer method: Stand pivot Chair/bed transfer assist level: Moderate assist (Pt 50 - 74%/lift or lower) Chair/bed transfer assistive device: Armrests, Walker  Function - Locomotion: Wheelchair Type: Manual Max wheelchair distance: 166ft  Assist Level: Touching or steadying assistance (Pt > 75%) Assist Level: Supervision or verbal cues Assist Level: Touching or steadying assistance (Pt > 75%) Function - Locomotion: Ambulation Assistive device: Walker-rolling Max distance: 82ft  Assist level: Moderate assist (Pt 50 - 74%) Assist level: Moderate assist (Pt 50 - 74%) Walk 50 feet with 2 turns activity did not occur: Safety/medical concerns Walk 150 feet activity did not occur: Safety/medical concerns Walk 10 feet on uneven surfaces activity did not  occur: Safety/medical concerns  Function - Comprehension Comprehension: Auditory Comprehension assist level: Understands basic 90% of the time/cues < 10% of the time  Function - Expression Expression: Verbal Expression assist level: Expresses basic 75 - 89% of the time/requires cueing 10 - 24% of the time. Needs helper to occlude trach/needs to repeat words.  Function - Social Interaction Social Interaction assist level: Interacts appropriately with others with medication or extra time (anti-anxiety, antidepressant).  Function - Problem Solving Problem solving assist level: Solves basic 90% of the time/requires cueing < 10% of the time  Function - Memory Memory assist level: Recognizes or recalls 75 - 89% of the time/requires cueing 10 - 24% of the time Patient normally able to recall (first 3 days only): Current season, Location of own room, That he or she is in a hospital, Staff names and faces  Medical Problem List and Plan:  1. 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 DVT Prophylaxis/Anticoagulation: Pharmaceutical: Lovenox  3. Pain Management: tylenol prn  4.  Mood: LCSW to follow for evaluation and support.  5. Neuropsych: This patient is capable of making decisions on her own behalf.  6. Skin/Wound Care: Continue pressure relief measures.  7. Fluids/Electrolytes/Nutrition: Encourage fluid intake. Check lytes in am.  8. HTN: Monitor BP bid --Restarted prinivil at 5mg ,  Increase to 10mg  (home dose 20mg ) Vitals:   07/26/17 1423 07/27/17 0505  BP: (!) 172/80 (!) 149/50  Pulse: 64 72  Resp: 17 18  Temp: 98.4 F (36.9 C) 98.2 F (36.8 C)  SpO2: 100% 95%   9. T2DM: Poorly controlled with Hgb A1C- 9.2. Monitor BS ac/hs. Continue lantus 15 units. Resume amaryl. Monitor for hypoglycemia CBG (last 3)   Recent Labs  07/26/17 1704 07/26/17 2021 07/27/17 0646  GLUCAP 139* 230*  191*  Controlled 9/17  10. Acute on chronic renal failure?: Baseline SCr- 1.3- 1.4.now 1.65, enc fluids --11. H/o bipolar disorder: has been managed with klonopin tid.   11. Peripheral neuropathy: based on worsening of renal status pharmacy consult rec reducing  gabapentin 1200 mg tid to 700mg  BID  12. Substance abuse: team to have further conversations regarding drug use. Set up with appropriate counseling after discharge  13.  Neck pain improved  warm Kpad, sportscreme and tramadol LOS (Days) 4 A FACE TO FACE EVALUATION WAS PERFORMED  KIRSTEINS,ANDREW E 07/27/2017, 7:49 AM

## 2017-07-28 ENCOUNTER — Inpatient Hospital Stay (HOSPITAL_COMMUNITY): Payer: Medicare Other | Admitting: Speech Pathology

## 2017-07-28 ENCOUNTER — Inpatient Hospital Stay (HOSPITAL_COMMUNITY): Payer: Self-pay | Admitting: Occupational Therapy

## 2017-07-28 ENCOUNTER — Inpatient Hospital Stay (HOSPITAL_COMMUNITY): Payer: Self-pay | Admitting: Physical Therapy

## 2017-07-28 DIAGNOSIS — G8191 Hemiplegia, unspecified affecting right dominant side: Secondary | ICD-10-CM

## 2017-07-28 LAB — GLUCOSE, CAPILLARY
GLUCOSE-CAPILLARY: 208 mg/dL — AB (ref 65–99)
GLUCOSE-CAPILLARY: 222 mg/dL — AB (ref 65–99)
Glucose-Capillary: 197 mg/dL — ABNORMAL HIGH (ref 65–99)
Glucose-Capillary: 169 mg/dL — ABNORMAL HIGH (ref 65–99)

## 2017-07-28 MED ORDER — LISINOPRIL 10 MG PO TABS
10.0000 mg | ORAL_TABLET | Freq: Every day | ORAL | Status: DC
  Administered 2017-07-29 – 2017-08-01 (×4): 10 mg via ORAL
  Filled 2017-07-28 (×4): qty 1

## 2017-07-28 MED ORDER — INFLUENZA VAC SPLIT HIGH-DOSE 0.5 ML IM SUSY
0.5000 mL | PREFILLED_SYRINGE | INTRAMUSCULAR | Status: AC
  Administered 2017-07-29: 0.5 mL via INTRAMUSCULAR
  Filled 2017-07-28: qty 0.5

## 2017-07-28 MED ORDER — INSULIN GLARGINE 100 UNIT/ML ~~LOC~~ SOLN
20.0000 [IU] | Freq: Every day | SUBCUTANEOUS | Status: DC
  Administered 2017-07-28 – 2017-07-29 (×2): 20 [IU] via SUBCUTANEOUS
  Filled 2017-07-28 (×2): qty 0.2

## 2017-07-28 NOTE — Progress Notes (Signed)
Subjective/Complaints:  No issues overnite, pt slept ok, discussed her help at home, husband, friends and children   ROS- no CP, SOB, N/V/D Objective: Vital Signs: Blood pressure (!) 174/72, pulse 75, temperature 97.9 F (36.6 C), temperature source Oral, resp. rate 18, weight 78 kg (172 lb), SpO2 97 %. No results found. Results for orders placed or performed during the hospital encounter of 07/23/17 (from the past 72 hour(s))  Glucose, capillary     Status: Abnormal   Collection Time: 07/25/17 11:32 AM  Result Value Ref Range   Glucose-Capillary 48 (L) 65 - 99 mg/dL  Glucose, capillary     Status: None   Collection Time: 07/25/17 12:14 PM  Result Value Ref Range   Glucose-Capillary 88 65 - 99 mg/dL  Glucose, capillary     Status: Abnormal   Collection Time: 07/25/17  4:46 PM  Result Value Ref Range   Glucose-Capillary 105 (H) 65 - 99 mg/dL  Glucose, capillary     Status: Abnormal   Collection Time: 07/25/17  7:48 PM  Result Value Ref Range   Glucose-Capillary 159 (H) 65 - 99 mg/dL   Comment 1 Notify RN   Glucose, capillary     Status: Abnormal   Collection Time: 07/26/17  6:35 AM  Result Value Ref Range   Glucose-Capillary 106 (H) 65 - 99 mg/dL   Comment 1 Notify RN   Glucose, capillary     Status: Abnormal   Collection Time: 07/26/17 12:02 PM  Result Value Ref Range   Glucose-Capillary 101 (H) 65 - 99 mg/dL  Glucose, capillary     Status: Abnormal   Collection Time: 07/26/17  5:04 PM  Result Value Ref Range   Glucose-Capillary 139 (H) 65 - 99 mg/dL  Glucose, capillary     Status: Abnormal   Collection Time: 07/26/17  8:21 PM  Result Value Ref Range   Glucose-Capillary 230 (H) 65 - 99 mg/dL   Comment 1 Notify RN   Glucose, capillary     Status: Abnormal   Collection Time: 07/27/17  6:46 AM  Result Value Ref Range   Glucose-Capillary 191 (H) 65 - 99 mg/dL  Glucose, capillary     Status: Abnormal   Collection Time: 07/27/17 12:06 PM  Result Value Ref Range    Glucose-Capillary 161 (H) 65 - 99 mg/dL  Glucose, capillary     Status: Abnormal   Collection Time: 07/27/17  4:35 PM  Result Value Ref Range   Glucose-Capillary 215 (H) 65 - 99 mg/dL  Glucose, capillary     Status: Abnormal   Collection Time: 07/27/17  9:07 PM  Result Value Ref Range   Glucose-Capillary 183 (H) 65 - 99 mg/dL  Glucose, capillary     Status: Abnormal   Collection Time: 07/28/17  6:36 AM  Result Value Ref Range   Glucose-Capillary 197 (H) 65 - 99 mg/dL     HEENT: mildly reduced neck ROM has 3/4 nl ROM, neg Spurlng's Cardio: RRR and no murmur Resp: CTA B/L and unlabored GI: BS positive and non tender non distended Extremity:  Pulses positive and No Edema Skin:   Intact Neuro: Alert/Oriented, Normal Sensory, Abnormal Motor 4/r R delt, bi, tri, grip, 3/5 Left delt Bi tri grip, 5/5 RLE , 4/5 LLE and Abnormal FMC Ataxic/ dec FMC Musc/Skel:  Other Right TMA Gen NAD, emotionally labile   Assessment/Plan: 1. Functional deficits secondary to R PL:IC infarct with L HP which require 3+ hours per day of interdisciplinary therapy in a comprehensive  inpatient rehab setting. Physiatrist is providing close team supervision and 24 hour management of active medical problems listed below. Physiatrist and rehab team continue to assess barriers to discharge/monitor patient progress toward functional and medical goals. FIM: Function - Bathing Position: Shower Body parts bathed by patient: Right arm, Left arm, Chest, Abdomen, Front perineal area, Right upper leg, Left upper leg Body parts bathed by helper: Buttocks, Right lower leg, Left lower leg, Back  Function- Upper Body Dressing/Undressing What is the patient wearing?: Pull over shirt/dress Pull over shirt/dress - Perfomed by patient: Thread/unthread right sleeve Pull over shirt/dress - Perfomed by helper: Thread/unthread left sleeve, Put head through opening, Pull shirt over trunk Assist Level: Touching or steadying  assistance(Pt > 75%) Function - Lower Body Dressing/Undressing What is the patient wearing?: Pants, Non-skid slipper socks Position: Wheelchair/chair at sink Underwear - Performed by helper: Thread/unthread right underwear leg, Thread/unthread left underwear leg, Pull underwear up/down Pants- Performed by helper: Thread/unthread right pants leg, Thread/unthread left pants leg, Pull pants up/down, Fasten/unfasten pants Non-skid slipper socks- Performed by helper: Don/doff left sock, Don/doff right sock Assist for footwear: Maximal assist Assist for lower body dressing: Touching or steadying assistance (Pt > 75%)  Function - Toileting Toileting activity did not occur: No continent bowel/bladder event Toileting steps completed by patient: Performs perineal hygiene Toileting steps completed by helper: Adjust clothing prior to toileting, Adjust clothing after toileting Toileting Assistive Devices: Grab bar or rail Assist level: Two helpers  Function - Air cabin crew transfer assistive device: Grab bar Assist level to toilet: Moderate assist (Pt 50 - 74%/lift or lower) Assist level from toilet: Moderate assist (Pt 50 - 74%/lift or lower)  Function - Chair/bed transfer Chair/bed transfer method: Stand pivot Chair/bed transfer assist level: Maximal assist (Pt 25 - 49%/lift and lower) Chair/bed transfer assistive device: Armrests, Walker Chair/bed transfer details: Tactile cues for sequencing, Tactile cues for weight shifting, Tactile cues for posture, Tactile cues for placement, Manual facilitation for weight shifting, Manual facilitation for placement, Verbal cues for technique, Verbal cues for sequencing  Function - Locomotion: Wheelchair Type: Manual Max wheelchair distance: 129ft  Assist Level: Touching or steadying assistance (Pt > 75%) Assist Level: Supervision or verbal cues Assist Level: Touching or steadying assistance (Pt > 75%) Function - Locomotion:  Ambulation Assistive device: Walker-rolling Max distance: 7ft  Assist level: Moderate assist (Pt 50 - 74%) Assist level: Moderate assist (Pt 50 - 74%) Walk 50 feet with 2 turns activity did not occur: Safety/medical concerns Walk 150 feet activity did not occur: Safety/medical concerns Walk 10 feet on uneven surfaces activity did not occur: Safety/medical concerns  Function - Comprehension Comprehension: Auditory Comprehension assist level: Understands basic 90% of the time/cues < 10% of the time  Function - Expression Expression: Verbal Expression assist level: Expresses basic 75 - 89% of the time/requires cueing 10 - 24% of the time. Needs helper to occlude trach/needs to repeat words.  Function - Social Interaction Social Interaction assist level: Interacts appropriately with others with medication or extra time (anti-anxiety, antidepressant).  Function - Problem Solving Problem solving assist level: Solves basic 90% of the time/requires cueing < 10% of the time  Function - Memory Memory assist level: Recognizes or recalls 75 - 89% of the time/requires cueing 10 - 24% of the time Patient normally able to recall (first 3 days only): Current season, Location of own room, That he or she is in a hospital, Staff names and faces  Medical Problem List and Plan:  1. 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 DVT Prophylaxis/Anticoagulation: Pharmaceutical: Lovenox  3. Pain Management: tylenol prn  4. Mood: LCSW to follow for evaluation and support.  5. Neuropsych: This patient is capable of making decisions on her own behalf.  6. Skin/Wound Care: Continue pressure relief measures.  7. Fluids/Electrolytes/Nutrition: Encourage fluid intake. Check lytes in am.  8. HTN: Monitor BP bid --Restarted prinivil at 5mg ,  Increase to 10mg  (home dose 20mg ) Vitals:   07/27/17 1531 07/28/17 0500   BP: (!) 177/69 (!) 174/72  Pulse: 78 75  Resp: 18 18  Temp: 98.1 F (36.7 C) 97.9 F (36.6 C)  SpO2: 98% 97%   9. T2DM: Poorly controlled with Hgb A1C- 9.2. Monitor BS ac/hs. Continue lantus 15 units. Resume amaryl. Monitor for hypoglycemia CBG (last 3)   Recent Labs  07/27/17 1635 07/27/17 2107 07/28/17 0636  GLUCAP 215* 183* 197*  Controlled 9/19  10. Acute on chronic renal failure?: Baseline SCr- 1.3- 1.4.now 1.65, enc fluids --11. H/o bipolar disorder: has been managed with klonopin tid.   11. Peripheral neuropathy: based on worsening of renal status pharmacy consult rec reducing  gabapentin 1200 mg tid to 700mg  BID  12. Substance abuse: team to have further conversations regarding drug use. Pt discussed this with neuropsych, I also explained the mechanisms by which cocaine increases stroke risk 13.  Neck pain improved  warm Kpad, sportscreme and tramadol 14.  Several stools semisolid- not taking miralax LOS (Days) 5 A FACE TO FACE EVALUATION WAS PERFORMED  Christel Bai E 07/28/2017, 9:15 AM

## 2017-07-28 NOTE — Progress Notes (Signed)
Speech Language Pathology Daily Session Note  Patient Details  Name: Tara Hopkins MRN: [id number] Date of Birth: [birth year]  Today's Date: 07/28/2017 SLP Individual Time: 1000-1055 SLP Individual Time Calculation (min): 55 min  Short Term Goals: Week 1: SLP Short Term Goal 1 (Week 1): Pt will demonstrate anticipatory awareness by listing 3 activities that are safe for pt to participate in within home environment.  SLP Short Term Goal 2 (Week 1): Pt will solve mildly complex problems related to daily living with supervision to Mod I cues.  SLP Short Term Goal 3 (Week 1): Pt will implement speech intelligibility strategies to achieve ~90% at the simple conversation level.   Skilled Therapeutic Interventions: Skilled treatment session focused on speech intelligibility goals. Upon arrival, patient was awake while supine in bed with reports of not feeling well, however, agreeable to participate in treatment session. Patient also reported she transferred herself back to bed. Patient educated on the importance of safety and calling for assistance. SLP facilitated session by providing supervision verbal cues for use of speech intelligibility strategies at the phrase and sentence level during a verbal description task to achieve 100% intelligibility. Patient left supine in bed with all needs within reach. Continue with current plan of care.      Function:  Cognition Comprehension Comprehension assist level: Understands basic 90% of the time/cues < 10% of the time  Expression   Expression assist level: Expresses basic 75 - 89% of the time/requires cueing 10 - 24% of the time. Needs helper to occlude trach/needs to repeat words.  Social Interaction Social Interaction assist level: Interacts appropriately with others with medication or extra time (anti-anxiety, antidepressant).  Problem Solving Problem solving assist level: Solves basic 90% of the time/requires cueing < 10% of the time  Memory Memory  assist level: Recognizes or recalls 75 - 89% of the time/requires cueing 10 - 24% of the time    Pain No/Denies Pain   Therapy/Group: Individual Therapy  Minetta Krisher, [place] Ignatowski 07/28/2017, 11:14 AM

## 2017-07-28 NOTE — Progress Notes (Signed)
Occupational Therapy Note  Patient Details  Name: Tara Hopkins MRN: [id number] Date of Birth: 06/15/1951  Today's Date: 07/28/2017 OT Missed Time: 61 Minutes Missed Time Reason: Patient ill (comment) (Pt states she has had diarrhea all night and all morning, unable to participate)     Marcella Dubs 07/28/2017, 12:01 PM

## 2017-07-28 NOTE — Progress Notes (Signed)
Physical Therapy Session Note  Patient Details  Name: Tara Hopkins MRN: [id number] Date of Birth: 08-10-1951  Today's Date: 07/28/2017 PT Individual Time: 1420-1515 PT Individual Time Calculation (min): 55 min   Short Term Goals: Week 1:  PT Short Term Goal 1 (Week 1): pt will transfer with consistent min assist and LRAD PT Short Term Goal 2 (Week 1): pt will ambulate 20' with min assist  PT Short Term Goal 3 (Week 1): pt will propel w/c 150 with min assist  Skilled Therapeutic Interventions/Progress Updates:    pt c/o not feeling well all day, but agreeable to going outside and to cafeteria with therapy.    Pt takes increased time and min assist to transfer to EOB and squat/pivot to w/c.  Attempted for supervision level transfer multiple attempts but pt unable to maintain forward weight shift enough to pivot to w/c.  PT propelled pt in w/c for time management.  Engaged in pet therapy with therapy dogs supervision for forward reach towards floor.  PT engaged pt in therapeutic conversation regarding second stroke, risk factors, and expectations for d/c and recovery.  Pt tearful again this session though not wanting to speak to neuropsych.  In cafeteria focus on communication and managing items while in w/c.  Returned to room and set up with lunch, call bell in reach and needs met.   Therapy Documentation Precautions:  Precautions Precautions: Fall Precaution Comments: Right transmetatarsal amputation Restrictions Weight Bearing Restrictions: No   See Function Navigator for Current Functional Status.   Therapy/Group: Individual Therapy  Michel Santee 07/28/2017, 4:39 PM

## 2017-07-28 NOTE — Progress Notes (Addendum)
Occupational Therapy Session Note  Patient Details  Name: Tara Hopkins MRN: [id number] Date of Birth: December 08, 1950  Today's Date: 07/28/2017  Session 1 OT Individual Time: 0800-0900 OT Individual Time Calculation (min): 60 min   Short Term Goals: Week 1:  OT Short Term Goal 1 (Week 1): Pt will complete bathing with Min A  OT Short Term Goal 2 (Week 1): Pt will complete 1/3 components of donning pants OT Short Term Goal 3 (Week 1): Pt will don shirt with supervision   Skilled Therapeutic Interventions/Progress Updates:  Session 1   Pt greeted supine in bed, stating she had a terrible night 2/2 multiple bouts of diarrhea. Pt agreeable to participate. Pt's emotions seem more stable today with no tearful episodes. Pt completed stand-pivot transfers bed>wc>raised toilet>wc>tub bench with min/mod A and heavy use of grab bars despite verbal and tactile cues for LE's to power up. Addressed hip hike and trunk rotation to reach behind for toileting. Pt able to complete hygiene but needed assistance for clothing management. Provided pt with long handled sponge to increase access to LB. Focused on forced use of L UE within bathing/dressing including ringing out towels and drying shower wall. Pt needed assistance to position LLE into figure 4 position, then she was able to use B UEs to thread socks. Pt donned dress with set-up A and completed sit<>stands with Min/Mod A and VC for hand placement. Once standing, facilitated anterior weight shift and full hip/trunk extension while pt tried to pull up pants. Pt ended up needed assistance to completely advance pants over hips. Pt left seated in wc at end of session with breakfast set-up and needs met.   Therapy Documentation Precautions:  Precautions Precautions: Fall Precaution Comments: Right transmetatarsal amputation Restrictions Weight Bearing Restrictions: No Pain:  none/denies pain ADL: ADL ADL Comments: Please see functional navigator for ADL  status  See Function Navigator for Current Functional Status.   Therapy/Group: Individual Therapy  Valma Cava 07/28/2017, 9:06 AM

## 2017-07-29 ENCOUNTER — Inpatient Hospital Stay (HOSPITAL_COMMUNITY): Payer: Self-pay | Admitting: Occupational Therapy

## 2017-07-29 ENCOUNTER — Inpatient Hospital Stay (HOSPITAL_COMMUNITY): Payer: Medicare Other | Admitting: Speech Pathology

## 2017-07-29 ENCOUNTER — Inpatient Hospital Stay (HOSPITAL_COMMUNITY): Payer: Self-pay | Admitting: Physical Therapy

## 2017-07-29 LAB — GLUCOSE, CAPILLARY
GLUCOSE-CAPILLARY: 269 mg/dL — AB (ref 65–99)
GLUCOSE-CAPILLARY: 164 mg/dL — AB (ref 65–99)
Glucose-Capillary: 152 mg/dL — ABNORMAL HIGH (ref 65–99)
Glucose-Capillary: 149 mg/dL — ABNORMAL HIGH (ref 65–99)

## 2017-07-29 NOTE — Progress Notes (Signed)
During report, it was mentioned that patient could possibly be smoking cigarettes during her pass to go off the floor with family. Discussed with patient, who admitted that she had 1 cigarette yesterday when she went out with her sister & that she had not smoked before that in 2 weeks. Did discuss the possible risks of smoking & benefits of smoking cessation. Patient verbalized understanding, but was concerned with who told & how I knew. She denied having any other cigarettes or paraphenalia and stated that she would not smoke again. She was asked if she would like to use the patch, but she stated she wanted the gum. The gum is ordered prn, but has not been asked for. Will encourage the patient to ask for the gum, but the patch may make her feel less embarrassed. Patient also talked about the events that led up to her admission & she went back to her first discharge from this department. She discussed certain drug usage leading up to her present admission as per her belief. I"I learned my lesson," she stated. Encouraged patient to talk about her feelings, to be honest with health care providers for health reasons & to prevent another CVA in the future. She verbalized understanding. No acute distress noted. Communication left for provider to re-evaluate for a nicotine patch & on-coming nurse will be notified of conversation.

## 2017-07-29 NOTE — Progress Notes (Signed)
Occupational Therapy Session Note  Patient Details  Name: Tara Hopkins MRN: [id number] Date of Birth: Oct 16, 1951  Today's Date: 07/29/2017 OT Individual Time: 2091-9802 OT Individual Time Calculation (min): 44 min   Short Term Goals: Week 1:  OT Short Term Goal 1 (Week 1): Pt will complete bathing with Min A  OT Short Term Goal 2 (Week 1): Pt will complete 1/3 components of donning pants OT Short Term Goal 3 (Week 1): Pt will don shirt with supervision   Skilled Therapeutic Interventions/Progress Updates:    Pt greeted wc in room with family present and requested privacy to have short discussion with family. OT returned to room and pt tearful about finding housing once dc. Provided emotional support and discussed importance of case manager involvement. Pt continued to be intermittently tearful throughout session. Pt completed stand-pivot transfers with RW and min A. Addressed core, glute, hip, and UB strengthening with quadruped position on therapy mat. Pt came to supine, then rolled to prone with assistance at hips and L UE to push up to quadruped. Pt held position for ~ 30 seconds x2  Before letting L UE give out. Addressed L hand fine motor coordination with graded peg board task. Despite weakness and ataxia, pt reports she does not want to work on her L UE and only cares about her L Leg strength. Pt completed LB there-ex seated on mat with knee marches and knee extension 10x2 sets. Sit<>stand with min/Mod A, then worked on weight shifting onto LLE with stepping activity and mini lunges. Pt needed L knee support during mini lunges. Side stepping along edge of mat with min A and assistance for RW managamenet. Focused on increased side-step length. Pt returned to room at end of session and left seated in wc with nurse tech present and needs met.   Therapy Documentation Precautions:  Precautions Precautions: Fall Precaution Comments: Right transmetatarsal amputation Restrictions Weight  Bearing Restrictions: No General: General OT Amount of Missed Time: 16 Minutes Pain:  none/denies pain ADL: ADL ADL Comments: Please see functional navigator for ADL status  See Function Navigator for Current Functional Status.   Therapy/Group: Individual Therapy  Valma Cava 07/29/2017, 12:50 PM

## 2017-07-29 NOTE — Progress Notes (Signed)
0930: Pt. Signed out on grounds pass, accompanied by friend.  Pt aware of therapy scheduled at 1030; verbalizes understanding to be back for therapy, instructed to not smoke while on pass. Pt's friend states she would ensure pt would not do so. At 1105 pt had not returned, missing PT session.  NT off unit to search for pt; pt. returned to unit in meantime, at 1130. Pt states she was aware she had missed therapy. Also stating she had to "take care of business."  Friend states she drove her to the apartment complex pt is expecting to go to at d/c. Also states pt did in fact smoke. Pt made aware [employer] revoked.  Social worker in to see pt regarding D/C plan.

## 2017-07-29 NOTE — Progress Notes (Signed)
Physical Therapy Note  Patient Details  Name: Erionna Strum MRN: [id number] Date of Birth: [birth year] Today's Date: 07/29/2017   Arrived for scheduled therapy session, NT reports pt had left unit with family member and not yet returned.  Will f/u this PM.   Shann Medal, PT, DPT    07/29/2017, 10:49 AM

## 2017-07-29 NOTE — Progress Notes (Signed)
Physical Therapy Session Note  Patient Details  Name: Tara Hopkins MRN: [id number] Date of Birth: [birth year]  Today's Date: 07/29/2017 PT Individual Time: 1500-1600 PT Individual Time Calculation (min): 60 min   Short Term Goals: Week 1:  PT Short Term Goal 1 (Week 1): pt will transfer with consistent min assist and LRAD PT Short Term Goal 2 (Week 1): pt will ambulate 13' with min assist  PT Short Term Goal 3 (Week 1): pt will propel w/c 150 with min assist  Skilled Therapeutic Interventions/Progress Updates:    no c/o pain, c/o fatigue throughout session, session focused on gait, activity tolerance, balance, and patient education.  Pt performed sit>stand and stand pivot transfers with min assist throughout session. Pt amb about 50 ft on carpet with min assist and RW, tactile cuing for weight shifting and verbal instruction for sequencing. Pt performed NuStep with B LE and UE x 10 min for activity tolerance, aiming to keep steps/minute at or above 30. Pt performed transfer from NuStep > w/c stand pivot with min assist. Pt performed transfer to mat table from w/c with min assist. Pt demonstrated good static sitting balance on mat table with step under feet for foot support. Pt worked on 28 piece puzzle while sitting on mat table to work on activity tolerance as well as sitting balance with reaching. Pt required increased time for puzzle and verbal cuing to rotate pieces and try pieces in different spots, c/o fatigue throughout working on puzzle. Pt educated on importance of therapy sessions and being present for them. Pt educated on living independently and having safety awareness rather than acting impulsively. Pt reported having therapy for 60 min at end of day (after 3 pm) was very difficult. Pt amb additional 5' from mat table to w/c with RW and min assist with verbal cuing for foot placement and safety awareness. Pt returned to room, left upright in w/c, agrees to call nurse tech for assist  back to bed.   Therapy Documentation Precautions:  Precautions Precautions: Fall Precaution Comments: Right transmetatarsal amputation Restrictions Weight Bearing Restrictions: No   See Function Navigator for Current Functional Status.   Therapy/Group: Individual Therapy  Michel Santee 07/29/2017, 4:02 PM

## 2017-07-29 NOTE — Progress Notes (Signed)
Speech Language Pathology Daily Session Note  Patient Details  Name: Tara Hopkins MRN: [id number] Date of Birth: [birth year]  Today's Date: 07/29/2017 SLP Individual Time: 1415-1500 SLP Individual Time Calculation (min): 45 min  Short Term Goals: Week 1: SLP Short Term Goal 1 (Week 1): Pt will demonstrate anticipatory awareness by listing 3 activities that are safe for pt to participate in within home environment.  SLP Short Term Goal 2 (Week 1): Pt will solve mildly complex problems related to daily living with supervision to Mod I cues.  SLP Short Term Goal 3 (Week 1): Pt will implement speech intelligibility strategies to achieve ~90% at the simple conversation level.   Skilled Therapeutic Interventions: Skilled treatment session focused on cognitive goals. SLP facilitated session by providing Min A verbal cues for organization and problem solving during a mildly complex money management task. SLP also provided patient with emotional support and Min A verbal cues for anticipatory awareness in regards to d/c planning. Patient left upright in wheelchair with all needs within reach. Continue with current plan of care.      Function:  Cognition Comprehension Comprehension assist level: Understands basic 90% of the time/cues < 10% of the time  Expression   Expression assist level: Expresses basic 90% of the time/requires cueing < 10% of the time.  Social Interaction Social Interaction assist level: Interacts appropriately with others with medication or extra time (anti-anxiety, antidepressant).  Problem Solving Problem solving assist level: Solves basic 90% of the time/requires cueing < 10% of the time  Memory Memory assist level: Recognizes or recalls 90% of the time/requires cueing < 10% of the time    Pain No/Denies Pain   Therapy/Group: Individual Therapy  Carolan Avedisian 07/29/2017, 3:13 PM

## 2017-07-29 NOTE — Progress Notes (Signed)
Subjective/Complaints:  Asking about D/C date We discussed her efforts to smoke while on hospital campus and increased stroke risk  ROS- no CP, SOB, N/V/D Objective: Vital Signs: Blood pressure 120/64, pulse 75, temperature 97.8 F (36.6 C), temperature source Oral, resp. rate 18, height 5\' 5"  (1.651 m), weight 78 kg (172 lb), SpO2 100 %. No results found. Results for orders placed or performed during the hospital encounter of 07/23/17 (from the past 72 hour(s))  Glucose, capillary     Status: Abnormal   Collection Time: 07/26/17 12:02 PM  Result Value Ref Range   Glucose-Capillary 101 (H) 65 - 99 mg/dL  Glucose, capillary     Status: Abnormal   Collection Time: 07/26/17  5:04 PM  Result Value Ref Range   Glucose-Capillary 139 (H) 65 - 99 mg/dL  Glucose, capillary     Status: Abnormal   Collection Time: 07/26/17  8:21 PM  Result Value Ref Range   Glucose-Capillary 230 (H) 65 - 99 mg/dL   Comment 1 Notify RN   Glucose, capillary     Status: Abnormal   Collection Time: 07/27/17  6:46 AM  Result Value Ref Range   Glucose-Capillary 191 (H) 65 - 99 mg/dL  Glucose, capillary     Status: Abnormal   Collection Time: 07/27/17 12:06 PM  Result Value Ref Range   Glucose-Capillary 161 (H) 65 - 99 mg/dL  Glucose, capillary     Status: Abnormal   Collection Time: 07/27/17  4:35 PM  Result Value Ref Range   Glucose-Capillary 215 (H) 65 - 99 mg/dL  Glucose, capillary     Status: Abnormal   Collection Time: 07/27/17  9:07 PM  Result Value Ref Range   Glucose-Capillary 183 (H) 65 - 99 mg/dL  Glucose, capillary     Status: Abnormal   Collection Time: 07/28/17  6:36 AM  Result Value Ref Range   Glucose-Capillary 197 (H) 65 - 99 mg/dL  Glucose, capillary     Status: Abnormal   Collection Time: 07/28/17 11:47 AM  Result Value Ref Range   Glucose-Capillary 169 (H) 65 - 99 mg/dL  Glucose, capillary     Status: Abnormal   Collection Time: 07/28/17  4:43 PM  Result Value Ref Range    Glucose-Capillary 208 (H) 65 - 99 mg/dL  Glucose, capillary     Status: Abnormal   Collection Time: 07/28/17  9:02 PM  Result Value Ref Range   Glucose-Capillary 222 (H) 65 - 99 mg/dL  Glucose, capillary     Status: Abnormal   Collection Time: 07/29/17  6:35 AM  Result Value Ref Range   Glucose-Capillary 152 (H) 65 - 99 mg/dL     HEENT: mildly reduced neck ROM has 3/4 nl ROM, neg Spurlng's Cardio: RRR and no murmur Resp: CTA B/L and unlabored GI: BS positive and non tender non distended Extremity:  Pulses positive and No Edema Skin:   Intact Neuro: Alert/Oriented, Normal Sensory, Abnormal Motor 4/r R delt, bi, tri, grip, 3/5 Left delt Bi tri grip, 5/5 RLE , 4/5 LLE and Abnormal FMC Ataxic/ dec FMC Musc/Skel:  Other Right TMA Gen NAD, emotionally labile   Assessment/Plan: 1. 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 Physiatrist is providing close team supervision and 24 hour management of active medical problems listed below. Physiatrist and rehab team continue to assess barriers to discharge/monitor patient progress toward functional and medical goals. FIM:  Function - Bathing Position: Shower Body parts bathed by patient: Right arm, Left arm, Chest, Abdomen, Front perineal area, Right upper leg, Right lower leg, Left lower leg, Left upper leg Body parts bathed by helper: Back, Buttocks Assist Level: Touching or steadying assistance(Pt > 75%)  Function- Upper Body Dressing/Undressing What is the patient wearing?: Pull over shirt/dress Pull over shirt/dress - Perfomed by patient: Thread/unthread right sleeve, Thread/unthread left sleeve, Put head through opening, Pull shirt over trunk Pull over shirt/dress - Perfomed by helper: Thread/unthread left sleeve, Put head through opening, Pull shirt over trunk Assist Level: Touching or steadying assistance(Pt > 75%),  Supervision or verbal cues Function - Lower Body Dressing/Undressing What is the patient wearing?: Underwear, Pants, Non-skid slipper socks Position: Wheelchair/chair at sink Underwear - Performed by helper: Thread/unthread left underwear leg, Thread/unthread right underwear leg, Pull underwear up/down Pants- Performed by helper: Thread/unthread right pants leg, Thread/unthread left pants leg, Pull pants up/down, Fasten/unfasten pants Non-skid slipper socks- Performed by patient: Don/doff left sock Non-skid slipper socks- Performed by helper: Don/doff right sock Assist for footwear: Partial/moderate assist Assist for lower body dressing: Touching or steadying assistance (Pt > 75%)  Function - Toileting Toileting activity did not occur: No continent bowel/bladder event Toileting steps completed by patient: Performs perineal hygiene Toileting steps completed by helper: Adjust clothing prior to toileting, Adjust clothing after toileting Toileting Assistive Devices: Grab bar or rail Assist level: Touching or steadying assistance (Pt.75%)  Function - Air cabin crew transfer assistive device: Grab bar Assist level to toilet: Moderate assist (Pt 50 - 74%/lift or lower) Assist level from toilet: Moderate assist (Pt 50 - 74%/lift or lower)  Function - Chair/bed transfer Chair/bed transfer method: Stand pivot Chair/bed transfer assist level: Maximal assist (Pt 25 - 49%/lift and lower) Chair/bed transfer assistive device: Armrests, Walker Chair/bed transfer details: Tactile cues for sequencing, Tactile cues for weight shifting, Tactile cues for posture, Tactile cues for placement, Manual facilitation for weight shifting, Manual facilitation for placement, Verbal cues for technique, Verbal cues for sequencing  Function - Locomotion: Wheelchair Type: Manual Max wheelchair distance: 144ft  Assist Level: Touching or steadying assistance (Pt > 75%) Assist Level: Supervision or verbal  cues Assist Level: Touching or steadying assistance (Pt > 75%) Function - Locomotion: Ambulation Assistive device: Walker-rolling Max distance: 24ft  Assist level: Moderate assist (Pt 50 - 74%) Assist level: Moderate assist (Pt 50 - 74%) Walk 50 feet with 2 turns activity did not occur: Safety/medical concerns Walk 150 feet activity did not occur: Safety/medical concerns Walk 10 feet on uneven surfaces activity did not occur: Safety/medical concerns  Function - Comprehension Comprehension: Auditory Comprehension assist level: Understands basic 90% of the time/cues < 10% of the time  Function - Expression Expression: Verbal Expression assist level: Expresses basic 75 - 89% of the time/requires cueing 10 - 24% of the time. Needs helper to occlude trach/needs to repeat words.  Function - Social Interaction Social Interaction assist level: Interacts appropriately with others with medication or extra time (anti-anxiety, antidepressant).  Function - Problem Solving Problem solving assist level: Solves basic 90% of the time/requires cueing < 10% of the time  Function - Memory Memory assist level: Recognizes or recalls 75 - 89% of the time/requires cueing 10 - 24% of the time Patient normally able to recall (first 3 days only): Current season, Location of own room, That he or she is in a hospital, Staff names and faces  Medical Problem List and Plan:  1. 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 DVT Prophylaxis/Anticoagulation: Pharmaceutical: Lovenox  3. Pain Management: tylenol prn  4. Mood: LCSW to follow for evaluation and support.  5. Neuropsych: This patient is capable of making decisions on her own behalf.  6. Skin/Wound Care: Continue pressure relief measures.  7. Fluids/Electrolytes/Nutrition: Encourage fluid intake. Check lytes in am.  8. HTN: Monitor BP bid --Restarted prinivil  at 5mg ,  Increase to 10mg  (home dose 20mg ) Controlled 9/20 Vitals:   07/28/17 1345 07/29/17 0522  BP: 134/77 120/64  Pulse: 64 75  Resp: 18 18  Temp: 97.8 F (36.6 C) 97.8 F (36.6 C)  SpO2: 98% 100%   9. T2DM: Poorly controlled with Hgb A1C- 9.2. Monitor BS ac/hs. Continue lantus 15 units. Resume amaryl. Monitor for hypoglycemia CBG (last 3)   Recent Labs  07/28/17 1643 07/28/17 2102 07/29/17 0635  GLUCAP 208* 222* 152*  elevated 9/20, just increased lantus check effect today  10. Acute on chronic renal failure?: Baseline SCr- 1.3- 1.4.now 1.65, enc fluids --11. H/o bipolar disorder: has been managed with klonopin tid.   11. Peripheral neuropathy: based on worsening of renal status pharmacy consult rec reducing  gabapentin 1200 mg tid to 700mg  BID  12. Substance abuse: team to have further conversations regarding drug use. Pt discussed this with neuropsych, I also explained the mechanisms by which cocaine increases stroke risk 13.  Neck pain improved  warm Kpad, sportscreme and tramadol 14.  Several stools semisolid- not taking miralax 15.  Tobacco abuse, discussed stroke risk and smoking, pt states that she will quit LOS (Days) 6 A FACE TO FACE EVALUATION WAS PERFORMED  KIRSTEINS,ANDREW E 07/29/2017, 7:57 AM

## 2017-07-30 ENCOUNTER — Inpatient Hospital Stay (HOSPITAL_COMMUNITY): Payer: Self-pay | Admitting: Occupational Therapy

## 2017-07-30 ENCOUNTER — Inpatient Hospital Stay (HOSPITAL_COMMUNITY): Payer: Self-pay | Admitting: Physical Therapy

## 2017-07-30 ENCOUNTER — Encounter (HOSPITAL_COMMUNITY): Payer: Self-pay | Admitting: *Deleted

## 2017-07-30 ENCOUNTER — Inpatient Hospital Stay (HOSPITAL_COMMUNITY): Payer: Medicare Other | Admitting: Speech Pathology

## 2017-07-30 LAB — GLUCOSE, CAPILLARY
GLUCOSE-CAPILLARY: 125 mg/dL — AB (ref 65–99)
GLUCOSE-CAPILLARY: 348 mg/dL — AB (ref 65–99)
GLUCOSE-CAPILLARY: 124 mg/dL — AB (ref 65–99)
Glucose-Capillary: 170 mg/dL — ABNORMAL HIGH (ref 65–99)

## 2017-07-30 LAB — BASIC METABOLIC PANEL
ANION GAP: 9 (ref 5–15)
BUN: 20 mg/dL (ref 6–20)
CO2: 23 mmol/L (ref 22–32)
Calcium: 8.9 mg/dL (ref 8.9–10.3)
Chloride: 108 mmol/L (ref 101–111)
Creatinine, Ser: 1.35 mg/dL — ABNORMAL HIGH (ref 0.44–1.00)
GFR calc Af Amer: 47 mL/min — ABNORMAL LOW (ref >60)
GFR, EST NON AFRICAN AMERICAN: 40 mL/min — AB (ref >60)
Glucose, Bld: 176 mg/dL — ABNORMAL HIGH (ref 65–99)
POTASSIUM: 3.9 mmol/L (ref 3.5–5.1)
SODIUM: 140 mmol/L (ref 135–145)

## 2017-07-30 LAB — CBC
HEMATOCRIT: 38 % (ref 36.0–46.0)
HEMOGLOBIN: 12 g/dL (ref 12.0–15.0)
MCH: 27.7 pg (ref 26.0–34.0)
MCHC: 31.6 g/dL (ref 30.0–36.0)
MCV: 87.8 fL (ref 78.0–100.0)
Platelets: 268 10*3/uL (ref 150–400)
RBC: 4.33 MIL/uL (ref 3.87–5.11)
RDW: 14.8 % (ref 11.5–15.5)
WBC: 6.8 10*3/uL (ref 4.0–10.5)

## 2017-07-30 MED ORDER — INSULIN GLARGINE 100 UNIT/ML ~~LOC~~ SOLN
25.0000 [IU] | Freq: Every day | SUBCUTANEOUS | Status: DC
  Administered 2017-07-30 – 2017-08-01 (×3): 25 [IU] via SUBCUTANEOUS
  Filled 2017-07-30 (×4): qty 0.25

## 2017-07-30 MED ORDER — LATANOPROST 0.005 % OP SOLN
1.0000 [drp] | Freq: Every day | OPHTHALMIC | Status: DC
  Administered 2017-07-30 – 2017-08-01 (×3): 1 [drp] via OPHTHALMIC
  Filled 2017-07-30: qty 2.5

## 2017-07-30 NOTE — Progress Notes (Signed)
Social Work Patient ID: Tara Hopkins, female   DOB: 01-04-1951, 66 y.o.   MRN: [id number]   Tara Hopkins, Tara Legacy, LCSW Social Worker Signed   Patient Care Conference Date of Service: 07/30/2017 10:02 AM      Hide copied text Hover for attribution information Inpatient RehabilitationTeam Conference and Plan of Care Update Date: 07/28/2017   Time: 11:10 AM      Patient Name: Tara Hopkins      Medical Record Number: [id number]  Date of Birth: [birth year] Sex: Female         Room/Bed: 4M09C/4M09C-01 Payor Info: Payor: MEDICARE / Plan: MEDICARE PART A AND B / Product Type: *No Product type* /     Admitting Diagnosis: CVA  Admit Date/Time:  07/23/2017  2:05 PM Admission Comments: No comment available    Primary Diagnosis:  Stroke (cerebrum) ([place]) Principal Problem: Stroke (cerebrum) [facility])       Patient Active Problem List    Diagnosis Date Noted  . Stroke (cerebrum) ([place]) 07/23/2017  . 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 Hyperlipidemia 07/22/2017  . Mild tetrahydrocannabinol (THC) abuse    . Smoker    . CVA (cerebral vascular accident) ([place]) 07/21/2017  . Ischemic stroke ([place]) 07/21/2017  . Diabetes mellitus with complication ([place]) 27/25/3664  . Slurred speech 07/21/2017  . Bipolar disorder ([place]) 09/09/2015  . Left hemiparesis ([place]) 02/20/2015  . Cocaine abuse    . Stroke ([place]) 02/15/2015  . Osteomyelitis of foot, right, acute ([place]) 02/23/2014  . 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 DM type 2 with diabetic peripheral neuropathy ([place]) 05/21/2013  . Essential hypertension 05/21/2013  . Depression 05/21/2013  . Anemia 05/21/2013  . Renal insufficiency 05/21/2013      Expected Discharge Date: Expected Discharge Date: 08/06/17   Team Members Present: Physician leading conference: Dr. Alysia Penna Social Worker Present: Alfonse Alpers, LCSW Nurse Present: Elliot Cousin, RN PT Present: Dwyane Dee, PT OT Present: Cherylynn Ridges, OT SLP Present: Weston Anna, SLP PPS Coordinator present : Daiva Nakayama, RN, CRRN       Current Status/Progress Goal Weekly Team Focus  Medical     supervision transfers, LUE strenght inmproving, R side neck and UE pain improved  improve problem solving and judgement  decreased emotional lability   Bowel/Bladder     Incontinent of bowel & bladder, LBM 07/27/17  less episodes of incontinence  timed toileting after meals & before bed   Swallow/Nutrition/ Hydration               ADL's     Min A overall  Supervision goals  modified bathing/dressing, standing balance/endurance, sit<>stand, transfer training, L NMR, L fine motor control   Mobility     supervision/min for transfers, mod for gait with RW, deconditioned  supervision overall short distance ambulation, min assist on stairs  strengthening, balance, activity tolerance, gait with RW   Communication     Supervision-Mod I  Mod I  speech intelligibility strategies    Safety/Cognition/ Behavioral Observations   Supervision   Mod I  problem solving, awareness    Pain     no c/o pain during night shift/ has tylenol & tramadol prn & neurontin scheduled, scheduled muscle rub to right shoulder for pain  pain scale <4  continue to assess & treat as needed   Skin     scatter bruising to abdomen, perineal irritation from frequent stools last night , skin barrier applied  no new areas of  skin break down  assess q shift     Rehab Goals Patient on target to meet rehab goals: Yes Rehab Goals Revised: none - first conference *See Care Plan and progress notes for long and short-term goals.      Barriers to Discharge   Current Status/Progress Possible Resolutions Date Resolved   Physician     Home environment access/layout;Decreased caregiver support;Medical stability  complicated social situation  progressing toward goals  cont rehab, SW to address discharge dispo      Nursing   Incontinence;Medication compliance             PT                      OT                 SLP            SW Lack of/limited family support              Discharge Planning/Teaching Needs:  Pt plans to d/c to her own, new apartment.  She will go with her sister for the first couple of days and then to her new place once her family has everything moved in for her.    Pt is not planning to have someone with her 24/7.  She will have intermittent help and will potentially hire someone to assist.   Team Discussion:  Pt with another stroke and hx of polysubstance abuse.  Pt seeing neuropsychologist on day of conference and has a difficult social situation for which CSW is providing her support.  Pt's neck/arm pain is improved.  She has had loose stools and is incontinent at night of urine.  Pt was tearful with PT and is supervision for tx and mod A for gait with overall supervision level goals.  Pt is min A for ADLs overall and OT is working on LB B&D techniques.  Upper body is getting stronger.  Pt needs more time due to safety awareness issues.  ST stated that pt put herself back to bed alone, confirming lack of safety awareness issues.  Working on higher level speech issues and awareness.  Revisions to Treatment Plan:  none    Continued Need for Acute Rehabilitation Level of Care: The patient requires daily medical management by a physician with specialized training in physical medicine and rehabilitation for the following conditions: Daily direction of a multidisciplinary physical rehabilitation program to ensure safe treatment while eliciting the highest outcome that is of practical value to the patient.: Yes Daily medical management of patient stability for increased activity during participation in an intensive rehabilitation regime.: Yes Daily analysis of laboratory values and/or radiology reports with any subsequent need for medication adjustment of medical intervention for : Neurological problems;Blood pressure problems   Donevin Sainsbury, Silvestre Mesi 07/30/2017, 10:03 AM

## 2017-07-30 NOTE — Progress Notes (Signed)
Subjective/Complaints:  Pt upset , her landlord will not come to hospital to sign lease  ROS- no CP, SOB, N/V/D Objective: Vital Signs: Blood pressure (!) 156/71, pulse 64, temperature 98.5 F (36.9 C), temperature source Oral, resp. rate 20, height _0  (1.651 m), weight 78 kg (172 lb), SpO2 99 %. No results found. Results for orders placed or performed during the hospital encounter of 07/23/17 (from the past 72 hour(s))  Glucose, capillary     Status: Abnormal   Collection Time: 07/27/17 12:06 PM  Result Value Ref Range   Glucose-Capillary 161 (H) 65 - 99 mg/dL  Glucose, capillary     Status: Abnormal   Collection Time: 07/27/17  4:35 PM  Result Value Ref Range   Glucose-Capillary 215 (H) 65 - 99 mg/dL  Glucose, capillary     Status: Abnormal   Collection Time: 07/27/17  9:07 PM  Result Value Ref Range   Glucose-Capillary 183 (H) 65 - 99 mg/dL  Glucose, capillary     Status: Abnormal   Collection Time: 07/28/17  6:36 AM  Result Value Ref Range   Glucose-Capillary 197 (H) 65 - 99 mg/dL  Glucose, capillary     Status: Abnormal   Collection Time: 07/28/17 11:47 AM  Result Value Ref Range   Glucose-Capillary 169 (H) 65 - 99 mg/dL  Glucose, capillary     Status: Abnormal   Collection Time: 07/28/17  4:43 PM  Result Value Ref Range   Glucose-Capillary 208 (H) 65 - 99 mg/dL  Glucose, capillary     Status: Abnormal   Collection Time: 07/28/17  9:02 PM  Result Value Ref Range   Glucose-Capillary 222 (H) 65 - 99 mg/dL  Glucose, capillary     Status: Abnormal   Collection Time: 07/29/17  6:35 AM  Result Value Ref Range   Glucose-Capillary 152 (H) 65 - 99 mg/dL  Glucose, capillary     Status: Abnormal   Collection Time: 07/29/17 11:55 AM  Result Value Ref Range   Glucose-Capillary 149 (H) 65 - 99 mg/dL   Comment 1 Notify RN   Glucose, capillary     Status: Abnormal   Collection Time: 07/29/17  5:02 PM  Result Value Ref Range   Glucose-Capillary 269 (H) 65 - 99 mg/dL    Comment 1 Notify RN   Glucose, capillary     Status: Abnormal   Collection Time: 07/29/17  9:10 PM  Result Value Ref Range   Glucose-Capillary 164 (H) 65 - 99 mg/dL  Basic metabolic panel     Status: Abnormal   Collection Time: 07/30/17  5:16 AM  Result Value Ref Range   Sodium 140 135 - 145 mmol/L   Potassium 3.9 3.5 - 5.1 mmol/L   Chloride 108 101 - 111 mmol/L   CO2 23 22 - 32 mmol/L   Glucose, Bld 176 (H) 65 - 99 mg/dL   BUN 20 6 - 20 mg/dL   Creatinine, Ser 1.35 (H) 0.44 - 1.00 mg/dL   Calcium 8.9 8.9 - 10.3 mg/dL   GFR calc non Af Amer 40 (L) >60 mL/min   GFR calc Af Amer 47 (L) >60 mL/min    Comment: (NOTE) The eGFR has been calculated using the CKD EPI equation. This calculation has not been validated in all clinical situations. eGFR's persistently <60 mL/min signify possible Chronic Kidney Disease.    Anion gap 9 5 - 15  CBC     Status: None   Collection Time: 07/30/17  5:16 AM  Result  Value Ref Range   WBC 6.8 4.0 - 10.5 K/uL   RBC 4.33 3.87 - 5.11 MIL/uL   Hemoglobin 12.0 12.0 - 15.0 g/dL   HCT 38.0 36.0 - 46.0 %   MCV 87.8 78.0 - 100.0 fL   MCH 27.7 26.0 - 34.0 pg   MCHC 31.6 30.0 - 36.0 g/dL   RDW 14.8 11.5 - 15.5 %   Platelets 268 150 - 400 K/uL  Glucose, capillary     Status: Abnormal   Collection Time: 07/30/17  6:24 AM  Result Value Ref Range   Glucose-Capillary 170 (H) 65 - 99 mg/dL     HEENT: mildly reduced neck ROM has 3/4 nl ROM, neg Spurlng's Cardio: RRR and no murmur Resp: CTA B/L and unlabored GI: BS positive and non tender non distended Extremity:  Pulses positive and No Edema Skin:   Intact Neuro: Alert/Oriented, Normal Sensory, Abnormal Motor 4/r R delt, bi, tri, grip, 3/5 Left delt Bi tri grip, 5/5 RLE , 4/5 LLE and Abnormal FMC Ataxic/ dec FMC Musc/Skel:  Other Right TMA Gen NAD, emotionally labile   Assessment/Plan: 1. Functional deficits secondary to R PL:IC infarct with L HP which require 3+ hours per day of interdisciplinary  therapy in a comprehensive inpatient rehab setting. Physiatrist is providing close team supervision and 24 hour management of active medical problems listed below. Physiatrist and rehab team continue to assess barriers to discharge/monitor patient progress toward functional and medical goals. FIM: Function - Bathing Position: Shower Body parts bathed by patient: Right arm, Left arm, Chest, Abdomen, Front perineal area, Right upper leg, Right lower leg, Left lower leg, Left upper leg Body parts bathed by helper: Back, Buttocks Assist Level: Touching or steadying assistance(Pt > 75%)  Function- Upper Body Dressing/Undressing What is the patient wearing?: Pull over shirt/dress Pull over shirt/dress - Perfomed by patient: Thread/unthread right sleeve, Thread/unthread left sleeve, Put head through opening, Pull shirt over trunk Pull over shirt/dress - Perfomed by helper: Thread/unthread left sleeve, Put head through opening, Pull shirt over trunk Assist Level: Touching or steadying assistance(Pt > 75%), Supervision or verbal cues Function - Lower Body Dressing/Undressing What is the patient wearing?: Underwear, Pants, Non-skid slipper socks Position: Wheelchair/chair at sink Underwear - Performed by helper: Thread/unthread left underwear leg, Thread/unthread right underwear leg, Pull underwear up/down Pants- Performed by helper: Thread/unthread right pants leg, Thread/unthread left pants leg, Pull pants up/down, Fasten/unfasten pants Non-skid slipper socks- Performed by patient: Don/doff left sock Non-skid slipper socks- Performed by helper: Don/doff right sock Assist for footwear: Partial/moderate assist Assist for lower body dressing: Touching or steadying assistance (Pt > 75%)  Function - Toileting Toileting activity did not occur: No continent bowel/bladder event Toileting steps completed by patient: Adjust clothing after toileting Toileting steps completed by helper: Performs perineal  hygiene, Adjust clothing prior to toileting Toileting Assistive Devices: Other (comment) Assist level: Touching or steadying assistance (Pt.75%)  Function - Toilet Transfers Toilet transfer assistive device: Grab bar Assist level to toilet: Moderate assist (Pt 50 - 74%/lift or lower) Assist level from toilet: Moderate assist (Pt 50 - 74%/lift or lower)  Function - Chair/bed transfer Chair/bed transfer method: Stand pivot Chair/bed transfer assist level: Touching or steadying assistance (Pt > 75%) Chair/bed transfer assistive device: Armrests Chair/bed transfer details: Tactile cues for weight shifting, Verbal cues for sequencing  Function - Locomotion: Wheelchair Type: Manual Max wheelchair distance: 135f  Assist Level: Touching or steadying assistance (Pt > 75%) Assist Level: Supervision or verbal cues Assist Level: Touching  or steadying assistance (Pt > 75%) Function - Locomotion: Ambulation Assistive device: Walker-rolling Max distance: 27' Assist level: Touching or steadying assistance (Pt > 75%) Assist level: Touching or steadying assistance (Pt > 75%) Walk 50 feet with 2 turns activity did not occur: Safety/medical concerns Assist level: Touching or steadying assistance (Pt > 75%) Walk 150 feet activity did not occur: Safety/medical concerns Walk 10 feet on uneven surfaces activity did not occur: Safety/medical concerns  Function - Comprehension Comprehension: Auditory Comprehension assist level: Understands basic 90% of the time/cues < 10% of the time  Function - Expression Expression: Verbal Expression assist level: Expresses basic 90% of the time/requires cueing < 10% of the time.  Function - Social Interaction Social Interaction assist level: Interacts appropriately with others with medication or extra time (anti-anxiety, antidepressant).  Function - Problem Solving Problem solving assist level: Solves basic 90% of the time/requires cueing < 10% of the  time  Function - Memory Memory assist level: Recognizes or recalls 90% of the time/requires cueing < 10% of the time Patient normally able to recall (first 3 days only): Current season, Location of own room, That he or she is in a hospital, Staff names and faces  Medical Problem List and Plan:  1. 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 DVT Prophylaxis/Anticoagulation: Pharmaceutical: Lovenox  3. Pain Management: tylenol prn  4. Mood: LCSW to follow for evaluation and support.  5. Neuropsych: This patient is capable of making decisions on her own behalf.  6. Skin/Wound Care: Continue pressure relief measures.  7. Fluids/Electrolytes/Nutrition: Encourage fluid intake. Check lytes in am.  8. HTN: Monitor BP bid --Restarted prinivil at 50m,  Increase to 140m(home dose 2091YOmild systolic elevation 9/0/60itals:   07/29/17 1357 07/30/17 0540  BP: 135/65 (!) 156/71  Pulse: (!) 57 64  Resp: 19 20  Temp: 97.9 F (36.6 C) 98.5 F (36.9 C)  SpO2: 99% 99%   9. T2DM: Poorly controlled with Hgb A1C- 9.2. Monitor BS ac/hs. Continue lantus 15 units. Resume amaryl. Monitor for hypoglycemia CBG (last 3)   Recent Labs  07/29/17 1702 07/29/17 2110 07/30/17 0624  GLUCAP 269* 164* 170*  elevated 9/21, just increased lantus again to 25U  10. Acute on chronic renal failure?: Baseline SCr- 1.3- 1.4.now 1.65, enc fluids --11. H/o bipolar disorder: has been managed with klonopin tid.   11. Peripheral neuropathy: based on worsening of renal status pharmacy consult rec reducing  gabapentin 1200 mg tid to 70034mID  12. Substance abuse: team to have further conversations regarding drug use. Pt discussed this with neuropsych, I also explained the mechanisms by which cocaine increases stroke risk 13.  Neck pain improved  warm Kpad, sportscreme and tramadol 14.  Several stools semisolid- not taking  miralax 15.  Tobacco abuse, discussed stroke risk and smoking, pt states that she will quit LOS (Days) 7 A FACE TO FACE EVALUATION WAS PERFORMED  KIRSTEINS,ANDREW E 07/30/2017, 7:54 AM

## 2017-07-30 NOTE — Patient Care Conference (Signed)
Inpatient RehabilitationTeam Conference and Plan of Care Update Date: 07/28/2017   Time: 11:10 AM    Patient Name: Tara Hopkins      Medical Record Number: [id number]  Date of Birth: 07/04/1951 Sex: Female         Room/Bed: 4M09C/4M09C-01 Payor Info: Payor: MEDICARE / Plan: MEDICARE PART A AND B / Product Type: *No Product type* /    Admitting Diagnosis: CVA  Admit Date/Time:  07/23/2017  2:05 PM Admission Comments: No comment available   Primary Diagnosis:  Stroke (cerebrum) ([place]) Principal Problem: Stroke (cerebrum) [facility])  Patient Active Problem List   Diagnosis Date Noted  . Stroke (cerebrum) ([place]) 07/23/2017  . 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 Hyperlipidemia 07/22/2017  . Mild tetrahydrocannabinol (THC) abuse   . Smoker   . CVA (cerebral vascular accident) ([place]) 07/21/2017  . Ischemic stroke ([place]) 07/21/2017  . Diabetes mellitus with complication ([place]) 64/33/2951  . Slurred speech 07/21/2017  . Bipolar disorder ([place]) 09/09/2015  . Left hemiparesis ([place]) 02/20/2015  . Cocaine abuse   . Stroke ([place]) 02/15/2015  . Osteomyelitis of foot, right, acute ([place]) 02/23/2014  . Status post transmetatarsal amputation of right foot ([place]) 12/15/2013  . DM type 2 with diabetic peripheral neuropathy ([place]) 05/21/2013  . Essential hypertension 05/21/2013  . Depression 05/21/2013  . Anemia 05/21/2013  . Renal insufficiency 05/21/2013    Expected Discharge Date: Expected Discharge Date: 08/06/17  Team Members Present: Physician leading conference: Dr. Alysia Penna Social Worker Present: Alfonse Alpers, LCSW Nurse Present: Elliot Cousin, RN PT Present: Dwyane Dee, PT OT Present: Cherylynn Ridges, OT SLP Present: Weston Anna, SLP PPS Coordinator present : Daiva Nakayama, RN, CRRN     Current Status/Progress Goal Weekly Team Focus  Medical   supervision transfers, LUE strenght inmproving, R side neck and UE pain improved  improve problem solving and judgement  decreased  emotional lability   Bowel/Bladder   Incontinent of bowel & bladder, LBM 07/27/17  less episodes of incontinence  timed toileting after meals & before bed   Swallow/Nutrition/ Hydration             ADL's   Min A overall  Supervision goals  modified bathing/dressing, standing balance/endurance, sit<>stand, transfer training, L NMR, L fine motor control   Mobility   supervision/min for transfers, mod for gait with RW, deconditioned  supervision overall short distance ambulation, min assist on stairs  strengthening, balance, activity tolerance, gait with RW   Communication   Supervision-Mod I  Mod I  speech intelligibility strategies    Safety/Cognition/ Behavioral Observations  Supervision   Mod I  problem solving, awareness    Pain   no c/o pain during night shift/ has tylenol & tramadol prn & neurontin scheduled, scheduled muscle rub to right shoulder for pain  pain scale <4  continue to assess & treat as needed   Skin   scatter bruising to abdomen, perineal irritation from frequent stools last night , skin barrier applied  no new areas of skin break down  assess q shift    Rehab Goals Patient on target to meet rehab goals: Yes Rehab Goals Revised: none - first conference *See Care Plan and progress notes for long and short-term goals.     Barriers to Discharge  Current Status/Progress Possible Resolutions Date Resolved   Physician    Home environment access/layout;Decreased caregiver support;Medical stability  complicated social situation  progressing toward goals  cont rehab, SW to address discharge dispo  Nursing  Incontinence;Medication compliance               PT                    OT                  SLP                SW Lack of/limited family support              Discharge Planning/Teaching Needs:  Pt plans to d/c to her own, new apartment.  She will go with her sister for the first couple of days and then to her new place once her family has everything  moved in for her.    Pt is not planning to have someone with her 24/7.  She will have intermittent help and will potentially hire someone to assist.   Team Discussion:  Pt with another stroke and hx of polysubstance abuse.  Pt seeing neuropsychologist on day of conference and has a difficult social situation for which CSW is providing her support.  Pt's neck/arm pain is improved.  She has had loose stools and is incontinent at night of urine.  Pt was tearful with PT and is supervision for tx and mod A for gait with overall supervision level goals.  Pt is min A for ADLs overall and OT is working on LB B&D techniques.  Upper body is getting stronger.  Pt needs more time due to safety awareness issues.  ST stated that pt put herself back to bed alone, confirming lack of safety awareness issues.  Working on higher level speech issues and awareness.  Revisions to Treatment Plan:  none    Continued Need for Acute Rehabilitation Level of Care: The patient requires daily medical management by a physician with specialized training in physical medicine and rehabilitation for the following conditions: Daily direction of a multidisciplinary physical rehabilitation program to ensure safe treatment while eliciting the highest outcome that is of practical value to the patient.: Yes Daily medical management of patient stability for increased activity during participation in an intensive rehabilitation regime.: Yes Daily analysis of laboratory values and/or radiology reports with any subsequent need for medication adjustment of medical intervention for : Neurological problems;Blood pressure problems  Daijha Leggio, Silvestre Mesi 07/30/2017, 10:03 AM

## 2017-07-30 NOTE — Progress Notes (Signed)
Physical Therapy Session Note  Patient Details  Name: Tara Hopkins MRN: [id number] Date of Birth: 1951-07-21  Today's Date: 07/30/2017 PT Individual Time: 1400-1500 PT Individual Time Calculation (min): 60 min   Short Term Goals: Week 1:  PT Short Term Goal 1 (Week 1): pt will transfer with consistent min assist and LRAD PT Short Term Goal 2 (Week 1): pt will ambulate 3' with min assist  PT Short Term Goal 3 (Week 1): pt will propel w/c 150 with min assist  Skilled Therapeutic Interventions/Progress Updates:    no c/o pain, session focus on activity tolerance and balance.    Pt transfers sit<>stand throughout session with supervision>min assist throughout session with verbal cues for maintaining forward weight shift, squaring up to surface before sitting, and reaching back for sitting session.    Gait training 2x40' with up to mod assist on first trial and min assist on second trial, seated rest break in between.  Pt requires verbal and tactile cues for step length and maintaining walker positioning, especially during turns.    Standing balance/tolerance with horseshoe task x2 trials until fatigue.  Standing marching 1 trial to fatigue, focus on LLE weight bearing and L knee position in stance.    Pt propelled w/c back to room x100' with BUEs and increased time.  Transfer to recliner at end of session, call bell in reach and needs met.   Therapy Documentation Precautions:  Precautions Precautions: Fall Precaution Comments: Right transmetatarsal amputation Restrictions Weight Bearing Restrictions: No   See Function Navigator for Current Functional Status.   Therapy/Group: Individual Therapy  Michel Santee 07/30/2017, 3:13 PM

## 2017-07-30 NOTE — Progress Notes (Signed)
Social Work Patient ID: Tara Hopkins, female   DOB: 08-01-1951, 66 y.o.   MRN: [id number]   CSW has met with pt daily this week and sometimes more than once per day to discuss her housing situation.  CSW also gave her team conference update and she knows of 08-06-17 targeted d/c date.  Pt is concerned that her family needs the keys to her new apartment to move her, but she has to sign the lease and she must do that in person.  CSW tried to advocate for pt with the apartment manager due to the special circumstances, but she stated it was fair housing law that the resident must sign the lease in the office, even when CSW offered for a notary to witness her signature in the hospital.  Manager did assure pt and CSW that pt's apartment would be there for her when she was discharged.  CSW talked with pt after this phone call and pt was calmer this morning about the whole situation and has peace in the new plan.  Pt's sister will pick her up next Friday when she is d/c'd from [place] and will take her to the leasing office where she can sign her lease and get her keys.  Her son will then move her in next weekend while pt stays with her sister. Pt will move to the apartment after next weekend.  All this being said, pt knows that our recommendation is for her to have supervision.  It seems pt will not heed this recommendation, but will have intermittent assistance from family/friends and is considering hiring someone for a couple hours a day.  CSW will continue to follow pt, but for now, she seems to have worked out the housing situation herself.

## 2017-07-30 NOTE — Progress Notes (Signed)
Occupational Therapy Session Note  Patient Details  Name: Tara Hopkins MRN: [id number] Date of Birth: 11/30/1950  Today's Date: 07/30/2017 OT Individual Time: 0933-1000 OT Individual Time Calculation (min): 27 min    Short Term Goals: Week 1:  OT Short Term Goal 1 (Week 1): Pt will complete bathing with Min A  OT Short Term Goal 2 (Week 1): Pt will complete 1/3 components of donning pants OT Short Term Goal 3 (Week 1): Pt will don shirt with supervision   Skilled Therapeutic Interventions/Progress Updates:    Pt seated EOB upon OT arrival and was agreeable to participating in dressing and grooming tasks.  Pt completed UB dressing with SU of clothing.  Improved I this date with donning pants, however pt is unable to don over hips or fasten without LOB posteriorly and requires assistance for both tasks.  Pt resistant to improving I in managing pants, stating she will not wear pants at home and does not see the need to practice skills.  Encouragement and education provided, however pt continued to express frustration over incorporating LUE into functional tasks and addressing LB dressing.  Pt completed grooming from w/c level at sink with set up of supplies.  Pt remained seated in w/c with call light and all other needs in reach upon OT departure.  Therapy Documentation Precautions:  Precautions Precautions: Fall Precaution Comments: Right transmetatarsal amputation Restrictions Weight Bearing Restrictions: No General:   Vital Signs:  Pain: Pain Assessment Pain Assessment: No/denies pain Pain Score: 0-No pain ADL: ADL ADL Comments: Please see functional navigator for ADL status See Function Navigator for Current Functional Status.   Therapy/Group: Individual Therapy  Marcella Dubs 07/30/2017, 1:35 PM

## 2017-07-30 NOTE — Progress Notes (Signed)
Occupational Therapy Session Note  Patient Details  Name: Tara Hopkins MRN: [id number] Date of Birth: 04-05-1951  Today's Date: 07/30/2017 OT Individual Time: 1100-1201 OT Individual Time Calculation (min): 61 min   Short Term Goals: Week 1:  OT Short Term Goal 1 (Week 1): Pt will complete bathing with Min A  OT Short Term Goal 2 (Week 1): Pt will complete 1/3 components of donning pants OT Short Term Goal 3 (Week 1): Pt will don shirt with supervision   Skilled Therapeutic Interventions/Progress Updates:    OT treatment session focused on UB and LBstrengthening, standing balance/endurance, and there-ex. Pt brought to therapy kitchen where pt initially stated she would be needing to get small snacks at home, but when asked to participate in kitchen activity, pt refused. Agreeable to work on standing endurance and UB coordination to access upper cabinets to collect fruit placed on 1st and 2nd upper shelf. Sit<>stand with min A and facilitation for upright posture and hand placement, Pt completed 15 mins UB and LB there-ex on Nustep level 3 with steps per minute about 27. Discussed OT purpose and goals with pt including functional use of L UE-pt verbalized understanding. Standing balance, reaction time,  and UB there-ex with dynavision activity. 2 trials for sit<>stand with mod A and increased time without AD. Pt needed min A to maintain standing balance while reaching with B UEs to touch light buttons. Overhead reaching facilitated trunk extension and addressed hip/ankle balance strategies with reaching outside base of support. Pt propelled wc on carpet with min A and VC for hand placement. She returned to room and was left seated in wc with needs met.   Therapy Documentation Precautions:  Precautions Precautions: Fall Precaution Comments: Right transmetatarsal amputation Restrictions Weight Bearing Restrictions: No Pain: Pain Assessment Pain Assessment: No/denies pain ADL: ADL ADL  Comments: Please see functional navigator for ADL status  See Function Navigator for Current Functional Status.   Therapy/Group: Individual Therapy  Valma Cava 07/30/2017, 12:24 PM

## 2017-07-30 NOTE — Progress Notes (Signed)
Nicorette gum that was given to the patient during the previous shift was found in her room not used. It was discarded. Will pass on to the next shift. No c/o pain or distress at this time. Will continue to monitor.

## 2017-07-30 NOTE — Progress Notes (Signed)
Speech Language Pathology Session Note & Discharge Summary  Patient Details  Name: Tara Hopkins MRN: [id number] Date of Birth: 1951/03/15  Today's Date: 07/30/2017 SLP Individual Time: 1000-1100 SLP Individual Time Calculation (min): 60 min   Skilled Therapeutic Interventions:  Skilled treatment session focused on cognitive goals. SLP facilitated session by providing extra time and Mod I for complex problem solving during a novel card task. Patient demonstrates increased anticipatory awareness in regards to d/c planning and reported she will be discharging home with her sister for supervision. Patient was also 100% intelligible at the conversation level with Mod I. Patient is at her baseline level of cognitive and speech functioning, therefore, patient will be discharged from skilled SLP intervention. Patient left upright in wheelchair with all needs within reach. Continue with current plan of care.   Patient has met 3 of 3 long term goals.  Patient to discharge at overall Modified Independent;Supervision level.   Reasons goals not met: N/A   Clinical Impression/Discharge Summary: Patient has made excellent gains and has met 3 of 3 LTG's this reporting period. Currently, patient is 90-100% at the conversation level with Mod I and is overall Mod I to complete functional and familiar tasks safely in regards to problem solving and awareness. Patient education is complete. Patient is at her baseline level of cognitive and speech functioning, therefore, she will be discharged from skilled SLP intervention without recommendations for f/u.   Recommendation:  None      Equipment: N/A   Reasons for discharge: Treatment goals met   Patient/Family Agrees with Progress Made and Goals Achieved: Yes   Function:   Cognition Comprehension Comprehension assist level: Follows complex conversation/direction with extra time/assistive device  Expression   Expression assist level: Expresses complex  ideas: With extra time/assistive device  Social Interaction Social Interaction assist level: Interacts appropriately with others with medication or extra time (anti-anxiety, antidepressant).  Problem Solving Problem solving assist level: Solves complex problems: With extra time  Memory Memory assist level: More than reasonable amount of time   Tara Hopkins 07/30/2017, 12:38 PM

## 2017-07-31 ENCOUNTER — Inpatient Hospital Stay (HOSPITAL_COMMUNITY): Payer: Medicare Other | Admitting: Occupational Therapy

## 2017-07-31 DIAGNOSIS — E1142 Type 2 diabetes mellitus with diabetic polyneuropathy: Secondary | ICD-10-CM

## 2017-07-31 LAB — GLUCOSE, CAPILLARY
GLUCOSE-CAPILLARY: 138 mg/dL — AB (ref 65–99)
Glucose-Capillary: 168 mg/dL — ABNORMAL HIGH (ref 65–99)
Glucose-Capillary: 196 mg/dL — ABNORMAL HIGH (ref 65–99)
Glucose-Capillary: 246 mg/dL — ABNORMAL HIGH (ref 65–99)

## 2017-07-31 NOTE — Progress Notes (Signed)
N.o. Administered this shift for xalatan eye gtts and lantus administered to abdomen. Without any noted adverse reactions this shift. callbell within reach. Will continue to monitor.

## 2017-07-31 NOTE — Progress Notes (Signed)
Pt is resting in bed quietly. Easily aroused. Jovial mood noted this shift. Varying conversations noted this shift and pt expresses her dislike of one staff member and expresses her desire to "teach her a lesson in dealing with people." Easily redirected in conversation. c/o pain to left shoulder and muscle rub applied per order and effective. Also c/o pain in the right elbow, heat applied with ace wrap to hold in place and effective. Pain management provided also and effective. Incontinent of b/b. Medium BM noted this shift that is brown, soft and formed. Able to make needs known. callbell within reach. Safety maintained. Will continue to monitor.

## 2017-07-31 NOTE — Progress Notes (Signed)
07/31/17 1637 nursing CBG 246. NT said family came to visit and she had food from cookout. RN educated patient regarding this matter.

## 2017-07-31 NOTE — Plan of Care (Signed)
Problem: RH BLADDER ELIMINATION Goal: RH STG MANAGE BLADDER WITH ASSISTANCE STG Manage Bladder With max Assistance  Outcome: Not Progressing Patient incontinent

## 2017-07-31 NOTE — Progress Notes (Signed)
Occupational Therapy Session Note  Patient Details  Name: Tara Hopkins MRN: [id number] Date of Birth: Oct 07, 1951  Today's Date: 07/31/2017 OT Individual Time: 0900-1000 OT Individual Time Calculation (min): 60 min    Short Term Goals: Week 1:  OT Short Term Goal 1 (Week 1): Pt will complete bathing with Min A  OT Short Term Goal 2 (Week 1): Pt will complete 1/3 components of donning pants OT Short Term Goal 3 (Week 1): Pt will don shirt with supervision   Skilled Therapeutic Interventions/Progress Updates:    Pt seen this session to facilitate balance and use of LUE with ADL training.  Pt received in bed and had just woken up and said she had to have time to have her coffee and breakfast now. Recommended we begin therapy and reheat breakfast as this was her only session of the day.  She wanted to eat first. Sat to EOB with min A and then maintained sitting balance EOB with S as she actively used her L hand to open containers with extra time and used L hand as a non dominant A with bimanual tasks.  Pt stated, "see I can use my hand just fine, I dont need it to get better, I can do everything I need right now". Discussed the current safety concerns with her balance and what she can work on to get more independent.  Pt was resistant to the idea that she could improve more physically, stating "this is how it is and I have accepted that, that is why the therapy is a waste of time".  Pt opened up more with counseling and became tearful about what she has and is going through. She then became receptive to the encouragement to work hard in therapy and try daily to improve her independence. Despite this she was very adamant that she did not want to bathe or shower even with max encouragement.  She did not have clean clothes to don and only wanted to wear hospital gown. Pt changed gowns, washed up her face and hands, then completed a stand pivot transfer to w/c with min A and cues for technique. Pt  requested to go sit at nursing station for some company.  Started a load of laundry for pt so she will have clothing for the rest of the week.   Therapy Documentation Precautions:  Precautions Precautions: Fall Precaution Comments: Right transmetatarsal amputation Restrictions Weight Bearing Restrictions: No      Pain: c/o R shoulder pain - pt states it is mild this AM   ADL: ADL ADL Comments: Please see functional navigator for ADL status    See Function Navigator for Current Functional Status.   Therapy/Group: Individual Therapy  Saul Dorsi 07/31/2017, 12:37 PM

## 2017-07-31 NOTE — Progress Notes (Signed)
Subjective/Complaints:  Sleeping soundly. No issues overnight  ROS: pt denies nausea, vomiting, diarrhea, cough, shortness of breath or chest pain   Objective: Vital Signs: Blood pressure (!) 148/69, pulse 64, temperature 97.8 F (36.6 C), temperature source Oral, resp. rate 16, height _0  (1.651 m), weight 78 kg (172 lb), SpO2 98 %. No results found. Results for orders placed or performed during the hospital encounter of 07/23/17 (from the past 72 hour(s))  Glucose, capillary     Status: Abnormal   Collection Time: 07/28/17 11:47 AM  Result Value Ref Range   Glucose-Capillary 169 (H) 65 - 99 mg/dL  Glucose, capillary     Status: Abnormal   Collection Time: 07/28/17  4:43 PM  Result Value Ref Range   Glucose-Capillary 208 (H) 65 - 99 mg/dL  Glucose, capillary     Status: Abnormal   Collection Time: 07/28/17  9:02 PM  Result Value Ref Range   Glucose-Capillary 222 (H) 65 - 99 mg/dL  Glucose, capillary     Status: Abnormal   Collection Time: 07/29/17  6:35 AM  Result Value Ref Range   Glucose-Capillary 152 (H) 65 - 99 mg/dL  Glucose, capillary     Status: Abnormal   Collection Time: 07/29/17 11:55 AM  Result Value Ref Range   Glucose-Capillary 149 (H) 65 - 99 mg/dL   Comment 1 Notify RN   Glucose, capillary     Status: Abnormal   Collection Time: 07/29/17  5:02 PM  Result Value Ref Range   Glucose-Capillary 269 (H) 65 - 99 mg/dL   Comment 1 Notify RN   Glucose, capillary     Status: Abnormal   Collection Time: 07/29/17  9:10 PM  Result Value Ref Range   Glucose-Capillary 164 (H) 65 - 99 mg/dL  Basic metabolic panel     Status: Abnormal   Collection Time: 07/30/17  5:16 AM  Result Value Ref Range   Sodium 140 135 - 145 mmol/L   Potassium 3.9 3.5 - 5.1 mmol/L   Chloride 108 101 - 111 mmol/L   CO2 23 22 - 32 mmol/L   Glucose, Bld 176 (H) 65 - 99 mg/dL   BUN 20 6 - 20 mg/dL   Creatinine, Ser 1.35 (H) 0.44 - 1.00 mg/dL   Calcium 8.9 8.9 - 10.3 mg/dL   GFR calc non Af  Amer 40 (L) >60 mL/min   GFR calc Af Amer 47 (L) >60 mL/min    Comment: (NOTE) The eGFR has been calculated using the CKD EPI equation. This calculation has not been validated in all clinical situations. eGFR's persistently <60 mL/min signify possible Chronic Kidney Disease.    Anion gap 9 5 - 15  CBC     Status: None   Collection Time: 07/30/17  5:16 AM  Result Value Ref Range   WBC 6.8 4.0 - 10.5 K/uL   RBC 4.33 3.87 - 5.11 MIL/uL   Hemoglobin 12.0 12.0 - 15.0 g/dL   HCT 38.0 36.0 - 46.0 %   MCV 87.8 78.0 - 100.0 fL   MCH 27.7 26.0 - 34.0 pg   MCHC 31.6 30.0 - 36.0 g/dL   RDW 14.8 11.5 - 15.5 %   Platelets 268 150 - 400 K/uL  Glucose, capillary     Status: Abnormal   Collection Time: 07/30/17  6:24 AM  Result Value Ref Range   Glucose-Capillary 170 (H) 65 - 99 mg/dL  Glucose, capillary     Status: Abnormal   Collection Time: 07/30/17 12:02  PM  Result Value Ref Range   Glucose-Capillary 125 (H) 65 - 99 mg/dL   Comment 1 Notify RN   Glucose, capillary     Status: Abnormal   Collection Time: 07/30/17  4:50 PM  Result Value Ref Range   Glucose-Capillary 348 (H) 65 - 99 mg/dL   Comment 1 Notify RN   Glucose, capillary     Status: Abnormal   Collection Time: 07/30/17  9:25 PM  Result Value Ref Range   Glucose-Capillary 124 (H) 65 - 99 mg/dL   Comment 1 Notify RN   Glucose, capillary     Status: Abnormal   Collection Time: 07/31/17  6:49 AM  Result Value Ref Range   Glucose-Capillary 168 (H) 65 - 99 mg/dL   Comment 1 Notify RN      HEENT: mildly reduced neck ROM has 3/4 nl ROM, neg Spurlng's Cardio: RRR without murmur. No JVD  Resp: CTA Bilaterally without wheezes or rales. Normal effort  GI: BS positive and non tender non distended Extremity:  Pulses positive and No Edema Skin:   Intact Neuro: Alert/Oriented, Normal Sensory, Abnormal Motor 4/r R delt, bi, tri, grip, 3/5 Left delt Bi tri grip, 5/5 RLE , 4/5 LLE and Abnormal FMC Ataxic/ dec FMC Musc/Skel:  Other Right  TMA Gen NAD, emotionally labile   Assessment/Plan: 1. 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 Physiatrist is providing close team supervision and 24 hour management of active medical problems listed below. Physiatrist and rehab team continue to assess barriers to discharge/monitor patient progress toward functional and medical goals. FIM: Function - Bathing Position: Shower Body parts bathed by patient: Right arm, Left arm, Chest, Abdomen, Front perineal area, Right upper leg, Right lower leg, Left lower leg, Left upper leg Body parts bathed by helper: Back, Buttocks Assist Level: Touching or steadying assistance(Pt > 75%)  Function- Upper Body Dressing/Undressing What is the patient wearing?: Pull over shirt/dress Pull over shirt/dress - Perfomed by patient: Thread/unthread right sleeve, Thread/unthread left sleeve, Put head through opening, Pull shirt over trunk Pull over shirt/dress - Perfomed by helper: Thread/unthread left sleeve, Put head through opening, Pull shirt over trunk Assist Level: Set up Function - Lower Body Dressing/Undressing What is the patient wearing?: Pants Position: Sitting EOB Underwear - Performed by helper: Thread/unthread left underwear leg, Thread/unthread right underwear leg, Pull underwear up/down Pants- Performed by patient: Thread/unthread right pants leg, Thread/unthread left pants leg Pants- Performed by helper: Pull pants up/down, Fasten/unfasten pants Non-skid slipper socks- Performed by patient: Don/doff left sock Non-skid slipper socks- Performed by helper: Don/doff right sock Assist for footwear: Partial/moderate assist Assist for lower body dressing: Touching or steadying assistance (Pt > 75%)  Function - Toileting Toileting activity did not occur: No continent bowel/bladder event Toileting steps completed by patient:  Adjust clothing after toileting Toileting steps completed by helper: Adjust clothing prior to toileting, Adjust clothing after toileting, Performs perineal hygiene Toileting Assistive Devices: Grab bar or rail Assist level: Touching or steadying assistance (Pt.75%)  Function - Air cabin crew transfer activity did not occur: N/A Toilet transfer assistive device: Grab bar Assist level to toilet: Moderate assist (Pt 50 - 74%/lift or lower) Assist level from toilet: Moderate assist (Pt 50 - 74%/lift or lower)  Function - Chair/bed transfer Chair/bed transfer method: Stand pivot Chair/bed transfer assist level: Touching or steadying assistance (Pt > 75%) Chair/bed transfer assistive device: Armrests Chair/bed  transfer details: Tactile cues for weight shifting, Verbal cues for sequencing  Function - Locomotion: Wheelchair Type: Manual Max wheelchair distance: 178f  Assist Level: Touching or steadying assistance (Pt > 75%) Assist Level: Supervision or verbal cues Assist Level: Touching or steadying assistance (Pt > 75%) Function - Locomotion: Ambulation Ambulation activity did not occur: N/A Assistive device: Walker-rolling Max distance: 558 Assist level: Touching or steadying assistance (Pt > 75%) Assist level: Touching or steadying assistance (Pt > 75%) Walk 50 feet with 2 turns activity did not occur: Safety/medical concerns Assist level: Touching or steadying assistance (Pt > 75%) Walk 150 feet activity did not occur: Safety/medical concerns Walk 10 feet on uneven surfaces activity did not occur: Safety/medical concerns  Function - Comprehension Comprehension: Auditory Comprehension assist level: Follows complex conversation/direction with extra time/assistive device  Function - Expression Expression: Verbal Expression assist level: Expresses complex ideas: With extra time/assistive device  Function - Social Interaction Social Interaction assist level: Interacts  appropriately with others with medication or extra time (anti-anxiety, antidepressant).  Function - Problem Solving Problem solving assist level: Solves complex problems: With extra time  Function - Memory Memory assist level: More than reasonable amount of time Patient normally able to recall (first 3 days only): Current season, Location of own room, That he or she is in a hospital, Staff names and faces  Medical Problem List and Plan:  1. 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 DVT Prophylaxis/Anticoagulation: Pharmaceutical: Lovenox  3. Pain Management: tylenol prn  4. Mood: LCSW to follow for evaluation and support.  5. Neuropsych: This patient is capable of making decisions on her own behalf.  6. Skin/Wound Care: Continue pressure relief measures.  7. Fluids/Electrolytes/Nutrition: Encourage fluid intake. Check lytes in am.  8. HTN: Monitor BP bid --Restarted prinivil at 53m  Increased to 1054mhome dose 22m67mith improvement so far Vitals:   07/30/17 1433 07/31/17 0500  BP: 129/63 (!) 148/69  Pulse: 78 64  Resp: 18 16  Temp: 98.8 F (37.1 C) 97.8 F (36.6 C)  SpO2: 98% 98%   9. T2DM: Poorly controlled with Hgb A1C- 9.2. Monitor BS ac/hs. Continue lantus 15 units. Resumed amaryl on 9.21. lantus increased to 25u on 9/21  -follow for pattern today CBG (last 3)   Recent Labs  07/30/17 1650 07/30/17 2125 07/31/17 0649  GLUCAP 348* 124* 168*     10. Acute on chronic renal failure?: Baseline SCr- 1.3- 1.4.now 1.65, enc fluids --11. H/o bipolar disorder: has been managed with klonopin tid.   11. Peripheral neuropathy: based on worsening of renal status pharmacy consult rec reducing  gabapentin 1200 mg tid to 700mg14m  12. Substance abuse: team to have further conversations regarding drug use. Pt discussed this with neuropsych, I also explained the mechanisms by which  cocaine increases stroke risk 13.  Neck pain improved  warm Kpad, sportscreme and tramadol 14.  Several stools semisolid- not taking miralax 15.  Tobacco abuse, discussed stroke risk and smoking, pt states that she will quit   LOS (Days) 8 A FACE TO FACE EVALUATION WAS PERFORMED  Kj Imbert T 07/31/2017, 8:45 AM

## 2017-08-01 ENCOUNTER — Inpatient Hospital Stay (HOSPITAL_COMMUNITY): Payer: Self-pay

## 2017-08-01 LAB — GLUCOSE, CAPILLARY
Glucose-Capillary: 139 mg/dL — ABNORMAL HIGH (ref 65–99)
Glucose-Capillary: 96 mg/dL (ref 65–99)
Glucose-Capillary: 114 mg/dL — ABNORMAL HIGH (ref 65–99)
Glucose-Capillary: 283 mg/dL — ABNORMAL HIGH (ref 65–99)
Glucose-Capillary: 176 mg/dL — ABNORMAL HIGH (ref 65–99)

## 2017-08-01 MED ORDER — ALPRAZOLAM 0.25 MG PO TABS
0.5000 mg | ORAL_TABLET | Freq: Two times a day (BID) | ORAL | Status: DC | PRN
  Administered 2017-08-01: 0.5 mg via ORAL
  Filled 2017-08-01: qty 2

## 2017-08-01 MED ORDER — LISINOPRIL 20 MG PO TABS
20.0000 mg | ORAL_TABLET | Freq: Every day | ORAL | Status: DC
  Administered 2017-08-02: 20 mg via ORAL
  Filled 2017-08-01: qty 1

## 2017-08-01 NOTE — Progress Notes (Signed)
Pt resting in bed quietly. Easily aroused. Without any further concerns s/p fall noted at this time. Another set of v/s btained at 0530. Will continue to monitor.

## 2017-08-01 NOTE — Progress Notes (Signed)
08/01/17 7944 nursing Patient very agitated when telesitter was placed in the room per NT she was tried to pull the cord of the camera and almost fell on her, she refused chair alarm. When RN went to room she said "to leave her alone."  telesitter monitor tech advised that she cannot have this and needed 1:1 sitter care. MD notified. New order noted.Agricultural consultant notified.   08/01/17 0954 nursing Patient is calmer RN was able to give her Xanax per order.  She agreed to move to room closer to nurses station. She agreed to place  pink belt but she does not like the chair alarm and the camera. . RN reassured patient that this is all for her safety. Pa Continued to monitor patient.  1131 08/01/17 nursing Patient took her pink belt off; Per NT she's leaning on her side at times trying to fix things in her room. NT reminded her but she told NT to mind her own business. RN checked patient from time to time but gets agitated.

## 2017-08-01 NOTE — Progress Notes (Signed)
Around 0130 pt used callbell for assistance. Staff NT went down to pt's room to find her on the floor on side of the bed lying on her right side and states when asked what happened "I slid off of the side of the bed." Pt was assisted up form the floor with the assistance of three staff members after assessment completed by RN. Pt states that she let down the the top of the side rail. Pt denies any pain. Was able to complete AROM and skin is without any noted areas of ecchymosis except for lower abdomen where she initially c/o tenderness R/T injections. Pt was noted earlier in the shift to have a visitor, wanted to stay up until late and make telephone calls. Pt was asked at 2100 if she was ready to go to bed and declined. Pt was assisted to bed around 2300 by two staff members because she was noted to be leaning to the right side while in the w/c and was not able to assist in transfer to bed safely. T/XC placed to on call provider and no new orders received. T/C placed to spouse, Nashley Cordoba and the phone just continuously rang. Will reattempt in the morning. Pt was safely assisted to bed with bed alarm active. Bed alarm was active and properly functioning this shift. Will continue to monitor.

## 2017-08-01 NOTE — Progress Notes (Signed)
08/01/17 1831 nursing Patient up on chair  calm and does not want pink belt on. Patient claims that she was overwhelmed this morning when telesitter was brought in and chair alarm was placed. Encouraged patient to call and not to get up unassisted; Patient agreed.

## 2017-08-01 NOTE — Progress Notes (Signed)
Physical Therapy Note  Patient Details  Name: Tara Hopkins MRN: [id number] Date of Birth: 22-Jan-1951 Today's Date: 08/01/2017  Pt missed 60 min individual tx  Pt seated in w/c, with quick release belt on recliner near her. Pt refused all attempts at tx, including seated or in bed. Pt became agitated when encourage to allow quick release belt to be applied while she is in w/c. Pt left resting in w/c with all needs within reach. Marjorie Smolder, RN informed.  See function navigator for current status. Goldie Dimmer 08/01/2017, 12:55 PM

## 2017-08-01 NOTE — Progress Notes (Signed)
Subjective/Complaints:  Up in bed eating breakfast. Tried to get out of bed on her own early this morning ("trying to move on my own"). Apparently slid to floor. No injuries.   ROS: pt denies nausea, vomiting, diarrhea, cough, shortness of breath or chest pain   Objective: Vital Signs: Blood pressure (!) 169/83, pulse 64, temperature 97.7 F (36.5 C), temperature source Oral, resp. rate 18, height 5' 5"  (1.651 m), weight 78 kg (172 lb), SpO2 99 %. No results found. Results for orders placed or performed during the hospital encounter of 07/23/17 (from the past 72 hour(s))  Glucose, capillary     Status: Abnormal   Collection Time: 07/29/17 11:55 AM  Result Value Ref Range   Glucose-Capillary 149 (H) 65 - 99 mg/dL   Comment 1 Notify RN   Glucose, capillary     Status: Abnormal   Collection Time: 07/29/17  5:02 PM  Result Value Ref Range   Glucose-Capillary 269 (H) 65 - 99 mg/dL   Comment 1 Notify RN   Glucose, capillary     Status: Abnormal   Collection Time: 07/29/17  9:10 PM  Result Value Ref Range   Glucose-Capillary 164 (H) 65 - 99 mg/dL  Basic metabolic panel     Status: Abnormal   Collection Time: 07/30/17  5:16 AM  Result Value Ref Range   Sodium 140 135 - 145 mmol/L   Potassium 3.9 3.5 - 5.1 mmol/L   Chloride 108 101 - 111 mmol/L   CO2 23 22 - 32 mmol/L   Glucose, Bld 176 (H) 65 - 99 mg/dL   BUN 20 6 - 20 mg/dL   Creatinine, Ser 1.35 (H) 0.44 - 1.00 mg/dL   Calcium 8.9 8.9 - 10.3 mg/dL   GFR calc non Af Amer 40 (L) >60 mL/min   GFR calc Af Amer 47 (L) >60 mL/min    Comment: (NOTE) The eGFR has been calculated using the CKD EPI equation. This calculation has not been validated in all clinical situations. eGFR's persistently <60 mL/min signify possible Chronic Kidney Disease.    Anion gap 9 5 - 15  CBC     Status: None   Collection Time: 07/30/17  5:16 AM  Result Value Ref Range   WBC 6.8 4.0 - 10.5 K/uL   RBC 4.33 3.87 - 5.11 MIL/uL   Hemoglobin 12.0 12.0 - 15.0  g/dL   HCT 38.0 36.0 - 46.0 %   MCV 87.8 78.0 - 100.0 fL   MCH 27.7 26.0 - 34.0 pg   MCHC 31.6 30.0 - 36.0 g/dL   RDW 14.8 11.5 - 15.5 %   Platelets 268 150 - 400 K/uL  Glucose, capillary     Status: Abnormal   Collection Time: 07/30/17  6:24 AM  Result Value Ref Range   Glucose-Capillary 170 (H) 65 - 99 mg/dL  Glucose, capillary     Status: Abnormal   Collection Time: 07/30/17 12:02 PM  Result Value Ref Range   Glucose-Capillary 125 (H) 65 - 99 mg/dL   Comment 1 Notify RN   Glucose, capillary     Status: Abnormal   Collection Time: 07/30/17  4:50 PM  Result Value Ref Range   Glucose-Capillary 348 (H) 65 - 99 mg/dL   Comment 1 Notify RN   Glucose, capillary     Status: Abnormal   Collection Time: 07/30/17  9:25 PM  Result Value Ref Range   Glucose-Capillary 124 (H) 65 - 99 mg/dL   Comment 1 Notify RN  Glucose, capillary     Status: Abnormal   Collection Time: 07/31/17  6:49 AM  Result Value Ref Range   Glucose-Capillary 168 (H) 65 - 99 mg/dL   Comment 1 Notify RN   Glucose, capillary     Status: Abnormal   Collection Time: 07/31/17 11:44 AM  Result Value Ref Range   Glucose-Capillary 196 (H) 65 - 99 mg/dL  Glucose, capillary     Status: Abnormal   Collection Time: 07/31/17  4:35 PM  Result Value Ref Range   Glucose-Capillary 246 (H) 65 - 99 mg/dL  Glucose, capillary     Status: Abnormal   Collection Time: 07/31/17  8:16 PM  Result Value Ref Range   Glucose-Capillary 138 (H) 65 - 99 mg/dL  Glucose, capillary     Status: Abnormal   Collection Time: 08/01/17  2:03 AM  Result Value Ref Range   Glucose-Capillary 139 (H) 65 - 99 mg/dL  Glucose, capillary     Status: None   Collection Time: 08/01/17  6:44 AM  Result Value Ref Range   Glucose-Capillary 96 65 - 99 mg/dL   Comment 1 Notify RN      HEENT: mildly reduced neck ROM has 3/4 nl ROM, neg Spurlng's Cardio: RRR without murmur. No JVD   Resp: CTA Bilaterally without wheezes or rales. Normal effort   GI: BS  positive and non tender non distended Extremity:  Pulses positive and No Edema Skin:   Intact Neuro: Alert/Oriented, Normal Sensory, Abnormal Motor 4/r R delt, bi, tri, grip, 3/5 Left delt Bi tri grip, 5/5 RLE , 4/5 LLE and Abnormal FMC Ataxic/ dec FMC Musc/Skel:  Other Right TMA Gen NAD, emotionally labile   Assessment/Plan: 1. 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 Physiatrist is providing close team supervision and 24 hour management of active medical problems listed below. Physiatrist and rehab team continue to assess barriers to discharge/monitor patient progress toward functional and medical goals. FIM: Function - Bathing Position: Shower Body parts bathed by patient: Right arm, Left arm, Chest, Abdomen, Front perineal area, Right upper leg, Right lower leg, Left lower leg, Left upper leg Body parts bathed by helper: Back, Buttocks Assist Level: Touching or steadying assistance(Pt > 75%)  Function- Upper Body Dressing/Undressing What is the patient wearing?: Pull over shirt/dress Pull over shirt/dress - Perfomed by patient: Thread/unthread right sleeve, Thread/unthread left sleeve, Put head through opening, Pull shirt over trunk Pull over shirt/dress - Perfomed by helper: Thread/unthread left sleeve, Put head through opening, Pull shirt over trunk Assist Level: Set up Function - Lower Body Dressing/Undressing What is the patient wearing?: Pants Position: Sitting EOB Underwear - Performed by helper: Thread/unthread left underwear leg, Thread/unthread right underwear leg, Pull underwear up/down Pants- Performed by patient: Thread/unthread right pants leg, Thread/unthread left pants leg Pants- Performed by helper: Pull pants up/down, Fasten/unfasten pants Non-skid slipper socks- Performed by patient: Don/doff left sock Non-skid slipper socks- Performed by  helper: Don/doff right sock Assist for footwear: Partial/moderate assist Assist for lower body dressing: Touching or steadying assistance (Pt > 75%)  Function - Toileting Toileting activity did not occur: No continent bowel/bladder event Toileting steps completed by patient: Adjust clothing after toileting Toileting steps completed by helper: Adjust clothing prior to toileting, Adjust clothing after toileting, Performs perineal hygiene Toileting Assistive Devices: Grab bar or rail Assist level: Touching or steadying assistance (Pt.75%)  Function - Air cabin crew  transfer activity did not occur: N/A Toilet transfer assistive device: Grab bar Assist level to toilet: Moderate assist (Pt 50 - 74%/lift or lower) Assist level from toilet: Moderate assist (Pt 50 - 74%/lift or lower)  Function - Chair/bed transfer Chair/bed transfer activity did not occur: Safety/medical concerns Chair/bed transfer method: Other Chair/bed transfer assist level: Maximal assist (Pt 25 - 49%/lift and lower) Chair/bed transfer assistive device: Armrests Chair/bed transfer details: Tactile cues for weight shifting, Verbal cues for sequencing  Function - Locomotion: Wheelchair Type: Manual Max wheelchair distance: 143f  Assist Level: Touching or steadying assistance (Pt > 75%) Assist Level: Supervision or verbal cues Assist Level: Touching or steadying assistance (Pt > 75%) Function - Locomotion: Ambulation Ambulation activity did not occur: N/A Assistive device: Walker-rolling Max distance: 542 Assist level: Touching or steadying assistance (Pt > 75%) Assist level: Touching or steadying assistance (Pt > 75%) Walk 50 feet with 2 turns activity did not occur: Safety/medical concerns Assist level: Touching or steadying assistance (Pt > 75%) Walk 150 feet activity did not occur: Safety/medical concerns Walk 10 feet on uneven surfaces activity did not occur: Safety/medical concerns  Function -  Comprehension Comprehension: Auditory Comprehension assist level: Understands basic 75 - 89% of the time/ requires cueing 10 - 24% of the time  Function - Expression Expression: Verbal Expression assist level: Expresses basic 75 - 89% of the time/requires cueing 10 - 24% of the time. Needs helper to occlude trach/needs to repeat words.  Function - Social Interaction Social Interaction assist level: Interacts appropriately with others with medication or extra time (anti-anxiety, antidepressant).  Function - Problem Solving Problem solving assist level: Solves complex problems: With extra time  Function - Memory Memory assist level: More than reasonable amount of time Patient normally able to recall (first 3 days only): Current season, Location of own room, That he or she is in a hospital, Staff names and faces  Medical Problem List and Plan:  1. 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  2. DVT Prophylaxis/Anticoagulation: Pharmaceutical: Lovenox  3. Pain Management: tylenol prn  4. Mood: LCSW to follow for evaluation and support.  5. Neuropsych: This patient is capable of making decisions on her own behalf.  6. Skin/Wound Care: Continue pressure relief measures.  7. Fluids/Electrolytes/Nutrition: Encourage fluid intake. Check lytes in am.  8. HTN: Monitor BP bid --Restarted prinivil at 551m  Increased to 1081mhome dose 69m23m--will resume home dose of 69mg53mals:   08/01/17 0330 08/01/17 0500  BP: (!) 158/77 (!) 169/83  Pulse: 63 64  Resp:  18  Temp: (!) 97.5 F (36.4 C) 97.7 F (36.5 C)  SpO2: 97% 99%   9. T2DM: Poorly controlled with Hgb A1C- 9.2. Monitor BS ac/hs. Continue lantus 15 units. Resumed amaryl on 9.21. lantus increased to 25u on 9/21  -some improvement over last 24 hours  CBG (last 3)   Recent Labs  07/31/17 2016  08/01/17 0203 08/01/17 0644  GLUCAP 138* 139* 96     10. Acute on chronic renal failure?: Baseline SCr- 1.3- 1.4.now 1.65, enc fluids --11. H/o bipolar disorder: has been managed with klonopin tid.   11. Peripheral neuropathy: based on worsening of renal status pharmacy consult rec reducing  gabapentin 1200 mg tid to 700mg 49m 12. Substance abuse: team to have further conversations regarding drug use. Pt discussed this with  neuropsych, I also explained the mechanisms by which cocaine increases stroke risk 13.  Neck pain improved  warm Kpad, sportscreme and tramadol 14.  Stools now formed. No bm this weekend yet 15.  Tobacco abuse   LOS (Days) 9 A FACE TO FACE EVALUATION WAS PERFORMED  Kalan Yeley T 08/01/2017, 8:35 AM

## 2017-08-01 NOTE — Progress Notes (Signed)
08/01/17 1426 nursing Patient is calm at this time ; she was sorry of how she acted this morning.. She requested to go to bed. Per PT she refused therapy.

## 2017-08-02 ENCOUNTER — Inpatient Hospital Stay (HOSPITAL_COMMUNITY): Payer: Medicare Other | Admitting: Occupational Therapy

## 2017-08-02 ENCOUNTER — Inpatient Hospital Stay (HOSPITAL_COMMUNITY): Payer: Self-pay | Admitting: Occupational Therapy

## 2017-08-02 ENCOUNTER — Inpatient Hospital Stay (HOSPITAL_COMMUNITY): Payer: Self-pay | Admitting: Physical Therapy

## 2017-08-02 LAB — GLUCOSE, CAPILLARY
Glucose-Capillary: 123 mg/dL — ABNORMAL HIGH (ref 65–99)
Glucose-Capillary: 185 mg/dL — ABNORMAL HIGH (ref 65–99)

## 2017-08-02 MED ORDER — BASAGLAR KWIKPEN 100 UNIT/ML ~~LOC~~ SOPN: 25.0000 [IU] | PEN_INJECTOR | Freq: Every day | SUBCUTANEOUS | 6 refills | Status: DC

## 2017-08-02 MED ORDER — TROLAMINE SALICYLATE 10 % EX CREA: TOPICAL_CREAM | Freq: Two times a day (BID) | CUTANEOUS | 0 refills | Status: DC | PRN

## 2017-08-02 MED ORDER — ATORVASTATIN CALCIUM 40 MG PO TABS: 40.0000 mg | ORAL_TABLET | Freq: Every day | ORAL | 0 refills | Status: DC

## 2017-08-02 MED ORDER — CLOPIDOGREL BISULFATE 75 MG PO TABS: 75.0000 mg | ORAL_TABLET | Freq: Every day | ORAL | 3 refills | Status: DC

## 2017-08-02 MED ORDER — GABAPENTIN 400 MG PO CAPS: 800.0000 mg | ORAL_CAPSULE | Freq: Two times a day (BID) | ORAL | 5 refills | Status: DC

## 2017-08-02 MED ORDER — LATANOPROST 0.005 % OP SOLN: 1.0000 [drp] | Freq: Every day | OPHTHALMIC | 12 refills | Status: DC

## 2017-08-02 NOTE — Progress Notes (Signed)
Patient reported to be sleepy and leaning more to the right. She reports that she is fatigued as was up late watching TV last night. Motor She is drooling more but able to answer questions and follow commands without difficulty. Appears sleepy and tired--discussed need to rule out UTI v/s another stroke. Will order BMET, CBC w/diff, UA/UCS and CXR. Will also order UDS.

## 2017-08-02 NOTE — Discharge Instructions (Signed)
Inpatient Rehab Discharge Instructions  [facility] Discharge date and time:    Activities/Precautions/ Functional Status: Activity: no lifting, driving, or strenuous exercise till cleared by MD Diet: cardiac diet and diabetic diet Wound Care: none needed   Functional status:  ___ No restrictions     ___ Walk up steps independently _X__ 24/7 supervision/assistance   ___ Walk up steps with assistance ___ Intermittent supervision/assistance  ___ Bathe/dress independently ___ Walk with walker     ___ Bathe/dress with assistance ___ Walk Independently    ___ Shower independently ___ Walk with assistance    ___ Shower with assistance ___ No alcohol     ___ Return to work/school ________  Special Instructions:   STROKE/TIA DISCHARGE INSTRUCTIONS SMOKING Cigarette smoking nearly doubles your risk of having a stroke & is the single most alterable risk factor  If you smoke or have smoked in the last 12 months, you are advised to quit smoking for your health.  Most of the excess cardiovascular risk related to smoking disappears within a year of stopping.  Ask you doctor about anti-smoking medications  [place] Quit Line: 1-800-QUIT NOW  Free Smoking Cessation Classes [phone]  CHOLESTEROL Know your levels; limit fat & cholesterol in your diet  Lipid Panel     Component Value Date/Time   CHOL 166 07/22/2017 0456   TRIG 191 (H) 07/22/2017 0456   HDL 41 07/22/2017 0456   CHOLHDL 4.0 07/22/2017 0456   VLDL 38 07/22/2017 0456   LDLCALC 87 07/22/2017 0456      Many patients benefit from treatment even if their cholesterol is at goal.  Goal: Total Cholesterol (CHOL) less than 160  Goal:  Triglycerides (TRIG) less than 150  Goal:  HDL greater than 40  Goal:  LDL (LDLCALC) less than 100   BLOOD PRESSURE American Stroke Association blood pressure target is less that 120/80 mm/Hg  Your discharge blood pressure is:  BP: (!) 160/72  Monitor your blood pressure  Limit your salt  and alcohol intake  Many individuals will require more than one medication for high blood pressure  DIABETES (A1c is a blood sugar average for last 3 months) Goal HGBA1c is under 7% (HBGA1c is blood sugar average for last 3 months)  Diabetes:     Lab Results  Component Value Date   HGBA1C 9.2 (H) 07/21/2017     Your HGBA1c can be lowered with medications, healthy diet, and exercise.  Check your blood sugar as directed by your physician  Call your physician if you experience unexplained or low blood sugars.  PHYSICAL ACTIVITY/REHABILITATION Goal is 30 minutes at least 4 days per week  Activity: No driving, Therapies: see above Return to work: N/A  Activity decreases your risk of heart attack and stroke and makes your heart stronger.  It helps control your weight and blood pressure; helps you relax and can improve your mood.  Participate in a regular exercise program.  Talk with your doctor about the best form of exercise for you (dancing, walking, swimming, cycling).  DIET/WEIGHT Goal is to maintain a healthy weight  Your discharge diet is: Diet heart healthy/carb modified Room service appropriate? Yes; Fluid consistency: Thin liquids Your height is:  Height: 5\' 5"  (165.1 cm) Your current weight is: Weight: 78 kg (172 lb) Your Body Mass Index (BMI) is:  BMI (Calculated): 28.62  Following the type of diet specifically designed for you will help prevent another stroke.  Your goal weight is:  150 lbs  Your goal  Body Mass Index (BMI) is 19-24.  Healthy food habits can help reduce 3 risk factors for stroke:  High cholesterol, hypertension, and excess weight.  RESOURCES Stroke/Support Group:  Call [phone]   STROKE EDUCATION PROVIDED/REVIEWED AND GIVEN TO PATIENT Stroke warning signs and symptoms How to activate emergency medical system (call 911). Medications prescribed at discharge. Need for follow-up after discharge. Personal risk factors for stroke. Pneumonia vaccine  given:  Flu vaccine given:  My questions have been answered, the writing is legible, and I understand these instructions.  I will adhere to these goals & educational materials that have been provided to me after my discharge from the hospital.     My questions have been answered and I understand these instructions. I will adhere to these goals and the provided educational materials after my discharge from the hospital.  Patient/Caregiver Signature _______________________________ Date __________  Clinician Signature _______________________________________ Date __________  Please bring this form and your medication list with you to all your follow-up doctor's appointments.

## 2017-08-02 NOTE — Progress Notes (Signed)
Occupational Therapy Session Note  Patient Details  Name: Tara Hopkins MRN: [id number] Date of Birth: May 10, 1951  Today's Date: 08/02/2017 OT Individual Time: 1030-1058 (make up time) OT Individual Time Calculation (min): 28 min    Short Term Goals: Week 1:  OT Short Term Goal 1 (Week 1): Pt will complete bathing with Min A  OT Short Term Goal 2 (Week 1): Pt will complete 1/3 components of donning pants OT Short Term Goal 3 (Week 1): Pt will don shirt with supervision   Skilled Therapeutic Interventions/Progress Updates:    Pt seen for make up session with focus on trunk control with transfers and sitting balance.  Pt received upright in w/c with minimal interaction with therapist, however stating we could do "whatever" during therapy session.  Squat pivot transfer mod assist to Lt to therapy mat with good initiation, therapist providing manual facilitation for weight shift.  Attempted to engage in table top task with pt reporting she was not going to complete it.  Therefore transitioned to increased therapeutic strategies to increase participation.  Engaged in positioning with focus on increased trunk control and active assisted stretching at neck and shoulders for increased alignment.  Engaged in reaching across midline with RUE to challenge sitting balance, weight shifting, and postural control with min assist from therapist to provide weight shift and trunk control.  Returned to w/c squat pivot max assist due to decreased initiation and participation.  Left upright in w/c with all needs in reach.  Therapy Documentation Precautions:  Precautions Precautions: Fall Precaution Comments: Right transmetatarsal amputation Restrictions Weight Bearing Restrictions: No General:   Vital Signs: Therapy Vitals BP: 130/60 Pain: Pain Assessment Pain Assessment: No/denies pain Pain Score: 0-No pain Patients Stated Pain Goal: 2 ADL: ADL ADL Comments: Please see functional navigator for ADL  status  See Function Navigator for Current Functional Status.   Therapy/Group: Individual Therapy  [name] 08/02/2017, 12:09 PM

## 2017-08-02 NOTE — Plan of Care (Signed)
Problem: RH Balance Goal: LTG Patient will maintain dynamic standing balance (PT) LTG:  Patient will maintain dynamic standing balance with assistance during mobility activities (PT)  Downgraded due to pt progress  Problem: RH Ambulation Goal: LTG Patient will ambulate in controlled environment (PT) LTG: Patient will ambulate in a controlled environment, # of feet with assistance (PT).  Downgraded due to pt progress Goal: LTG Patient will ambulate in home environment (PT) LTG: Patient will ambulate in home environment, # of feet with assistance (PT).  Downgraded due to pt progress

## 2017-08-02 NOTE — Progress Notes (Signed)
Patient left AMA. Patient reported she" will not stay another day at this hospital when my family can take care of me". Lezlie Lye, PA notified and reviewed with patient she should stay until discharge day. Patient refused and initially refused to sign AMA form but was agreeable to sign. Patient discharged at 29 with [facility] with [place] belongings.  Reviewed signs and symptoms of stroke and when to call 911 if needed.  Mliss Sax

## 2017-08-02 NOTE — Progress Notes (Addendum)
Patient's HH aide and grandaughter packed up patients and elected to sign patient out AMA.  Patient stated that she does not want to be here, has refused labs or work up.  Family was advised that this was not the best decision and that she will require  24 hours supervision/assistance after discharge. Patient and family advised to follow up with primary care provider in 24-48 hours for evaluation.

## 2017-08-02 NOTE — Plan of Care (Signed)
Problem: RH SAFETY Goal: RH STG ADHERE TO SAFETY PRECAUTIONS W/ASSISTANCE/DEVICE STG Adhere to Safety Precautions With Min Assistance/Device.  Outcome: Not Progressing Patient requires mod cueing to maintain safety and staff required to check q hour Goal: RH STG DECREASED RISK OF FALL WITH ASSISTANCE STG Decreased Risk of Fall With min. Assistance.   Outcome: Not Progressing Mod assist   Problem: RH COGNITION-NURSING Goal: RH STG ANTICIPATES NEEDS/CALLS FOR ASSIST W/ASSIST/CUES STG Anticipates Needs/Calls for Assist With Min Assistance/Cues.  Outcome: Not Progressing Mod assist

## 2017-08-02 NOTE — Discharge Summary (Signed)
Physician Discharge Summary  Patient ID: Tara Hopkins MRN: [id number] DOB/AGE: Mar 25, 1951 66 y.o.  Admit date: 07/23/2017 Discharge date: 08/02/2017  Discharge Diagnoses:  Principal Problem:   Stroke (cerebrum) [facility]) Active Problems:   DM type 2 with diabetic peripheral neuropathy (HCC)   Left hemiparesis (HCC)   Hyperlipidemia   Mild tetrahydrocannabinol (THC) abuse   Subcortical infarction [facility])   Discharged Condition: stable   Significant Diagnostic Studies: Dg Cervical Spine 2 Or 3 Views  Result Date: 07/24/2017 CLINICAL DATA:  Right-sided neck pain x3 days. History of stroke 3 days ago with right-sided weakness. No injury or fall. EXAM: CERVICAL SPINE - 2-3 VIEW COMPARISON:  None. FINDINGS: There is no evidence of cervical spine fracture or prevertebral soft tissue swelling. Moderate multilevel bilateral facet arthropathy. Uncovertebral joint spurring is identified on the right at C5-6. The atlantodental interval is maintained. No jumped appearing facets. Alignment is normal. Moderate disc space narrowing C5-6 with anterior osteophytes. 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 No acute fracture or suspicious osseous lesions. Electronically Signed   By: Ashley Royalty M.D.   On: 07/24/2017 17:26    Labs:  Basic Metabolic Panel: BMP Latest Ref Rng & Units 07/30/2017 07/24/2017 07/23/2017  Glucose 65 - 99 mg/dL 176(H) 95 147(H)  BUN 6 - 20 mg/dL 20 18 14   Creatinine 0.44 - 1.00 mg/dL 1.35(H) 1.65(H) 1.37(H)  Sodium 135 - 145 mmol/L 140 139 139  Potassium 3.5 - 5.1 mmol/L 3.9 4.4 4.0  Chloride 101 - 111 mmol/L 108 106 109  CO2 22 - 32 mmol/L 23 26 26   Calcium 8.9 - 10.3 mg/dL 8.9 8.7(L) 8.4(L)    CBC: CBC Latest Ref Rng & Units 07/30/2017 07/24/2017 07/23/2017  WBC 4.0 - 10.5 K/uL 6.8 9.0 7.3  Hemoglobin 12.0 - 15.0 g/dL 12.0 12.4 10.9(L)  Hematocrit 36.0 - 46.0 % 38.0 39.8 34.8(L)  Platelets 150 - 400 K/uL 268  280 284    CBG:  Recent Labs Lab 08/01/17 1128 08/01/17 1628 08/01/17 2033 08/02/17 0700 08/02/17 1134  GLUCAP 114* 283* 176* 123* 185*    Brief HPI:   Tara Hopkins is a 66 y.o. female with history of HTN, T2DM with peripheral neuropathy, bipolar disorder, polysubstance abuse, CVA with residual L-HP--CIR stay, who was admitted on 07/21/17 with worsening of left sided weakness. MRI brain revealed acute/subacute infarct in right posterior limb of internal capsule and moderate parenchymal volume loss of brain with moderate SVD. MRA brain/neck showed intracranial stenosis affecting left PCA with high grade stenosis P2 branches, 40% stenosis L-ICA, 60% stensis L-SA and bilateral high grade vertebral origin stenosis. 2D echo showed EF 55-60% with no wall abnormality and grade 2 diastolic dysfunction. UDS positive for THC and patient admitted to using cocaine 3-4 weeks ago. Neurology recommended changing ASA to plavix and starting high intensity statin. Therapy evaluations done yesterday and CIR recommended due to deficits in mobility   Hospital Course: [name] was admitted to rehab 07/23/2017 for inpatient therapies to consist of PT, ST and OT at least three hours five days a week. Past admission physiatrist, therapy team and rehab RN have worked together to provide customized collaborative inpatient rehab. Blood pressures were monitored with permissive hypertension for few days. As blood pressures continued to be elevated, lisinopril was resumed and titrated to home dose. Diabetes has been monitored on ac/hs basis and lantus was resumed on 9/14. This has been titrated upwards as intake has been good. She has been educated  on importance of cessation of tobacco and marijuana for stroke prevention measures. Dr. Sima Matas was consulted for evaluation of mood and has worked with patient on adaptive skills to help with coping. She exhibited poor insight into deficits with impulsivity and poor  choices. She slid to the floor on early am of 9/23 and no injury reported. She has required cues for safety and fall prevention. On9/24, patient was noted to be tired with lethargy in am, had increased balance deficits and wet voice. She reported fatigue due to decreased sleep but refused work up to rule out infection. She elected to sign out AMA as she felt that we were too restrictive. Family was advised that this was against medical advise as patient needed assistance with all activity. They reported that they would  24 hours assistance and have been instructed to follow up with primary care provider in 24-48 hours.     Rehab course: During patient's stay in rehab weekly team conferences were held to monitor patient's progress, set goals and discuss barriers to discharge. At admission, patient required mod assist with mobility and max assist with basic self care tasks. Speech therapy has focused on strategies to improve speech intelligibility as well deficits with  semi complex tasks. She has had improvement in activity tolerance, balance, postural control, as well as ability to compensate for deficits. She is modified independent to perform complex tasks with extra time. Speech is 100% intelligible at conversation level and cognition is at baseline. Speech therapy signed off on 9/21. She requires min to mod assist to complete ADL tasks. She requires min to mod assist for transfers, min assist to ambulate 40' and needed supervision for wheelchair mobility. She continued to have issues with poor safety awareness and impulsivity. SNF was recommended due to decline in function but patient refused this recommendation. .     Disposition: 01-Home or Self Care against medical advise.   Diet: Diabetic diet.   Special Instructions: 1. Needs to follow up with primary provider in 24-48 hours. 2. Family to provide 24 hours supervision/assistance.    Discharge Medication List as of 08/02/2017  4:06 PM    START  taking these medications   Details  latanoprost (XALATAN) 0.005 % ophthalmic solution Place 1 drop into both eyes at bedtime., Starting Mon 08/02/2017, No Print    trolamine salicylate (ASPERCREME) 10 % cream Apply topically 2 (two) times daily as needed for muscle pain., Starting Mon 08/02/2017, Normal      CONTINUE these medications which have CHANGED   Details  clopidogrel (PLAVIX) 75 MG tablet Take 1 tablet (75 mg total) by mouth daily., Starting Mon 08/02/2017, Normal    gabapentin (NEURONTIN) 400 MG capsule Take 2 capsules (800 mg total) by mouth 2 (two) times daily., Starting Mon 08/02/2017, Until Wed 09/01/2017, No Print    Insulin Glargine (BASAGLAR KWIKPEN) 100 UNIT/ML SOPN Inject 0.25 mLs (25 Units total) into the skin at bedtime., Starting Mon 08/02/2017, Normal      CONTINUE these medications which have NOT CHANGED   Details  clonazePAM (KLONOPIN) 0.5 MG tablet take 1 tablet by mouth three times a day, Phone In    glimepiride (AMARYL) 4 MG tablet take 1 tablet once daily, Normal    glucose blood test strip Test blood sugars 3 times daily Dx: E11.42, Normal    Insulin Pen Needle (B-D UF III MINI PEN NEEDLES) 31G X 5 MM MISC Use as directed., Normal    lisinopril (PRINIVIL,ZESTRIL) 20 MG tablet Take 1  tablet (20 mg total) by mouth daily., Starting Tue 11/12/2015, Normal    ONE TOUCH LANCETS MISC USE TO CHECK BLOOD SUGAR 3 TIMES DAILY, Normal    atorvastatin (LIPITOR) 40 MG tablet Take 1 tablet (40 mg total) by mouth daily at 6 PM., Starting Thu 07/22/2017, Print      STOP taking these medications     COD LIVER OIL PO      lamoTRIgine (LAMICTAL) 100 MG tablet        Follow-up Information    Kirsteins, Luanna Salk, MD Follow up.   Specialty:  Physical Medicine and Rehabilitation Why:  Office will call you with follow up appointment Contact information: [place] [address] [phone]        Rosalin Hawking, MD. Call in 1 day(s).   Specialty:   Neurology Why:  for follow up appointment in 4 weeks Contact information: [address] [place] [address] [phone]        [name], NP. Call in 1 day(s).   Specialty:  Family Medicine Why:  Needs to follow up with primary care in next 1-2 days for evaluation.  Contact information: [place] [address] [phone]           Signed: Bary Leriche 08/02/2017, 4:22 PM

## 2017-08-02 NOTE — Progress Notes (Signed)
Occupational Therapy Weekly Progress Note  Patient Details  Name: Tara Hopkins MRN: [id number] Date of Birth: 12/26/1950  Beginning of progress report period: July 24, 2017 End of progress report period: August 02, 2017  Today's Date: 08/02/2017  Session 1 OT Individual Time: 0800-0900 OT Individual Time Calculation (min): 60 min   Session 2 OT Individual Time: 1415-1530 OT Individual Time Calculation (min): 75 min   Patient has met 3 of 3 short term goals.  Pt has been making steady progress with OT over the week, but has experienced a decline over there weekend. Pt very negative and required encouragement to participate in any therapy. Pt had progressed to min A with transfers last week, and today she needed a Mod/Max A for squat-pivot transfers. Pt's goals are currently set for supervision level. If she does not have a better day tomorrow, some goals may have to be downgraded. Will follow up.  Patient continues to demonstrate the following deficits: muscle weakness, impaired timing and sequencing, unbalanced muscle activation and decreased coordination, decreased awareness, decreased problem solving and decreased safety awareness and decreased sitting balance, decreased standing balance, decreased postural control, hemiplegia and decreased balance strategies and therefore will continue to benefit from skilled OT intervention to enhance overall performance with BADL.  Patient progressing toward long term goals..  Continue plan of care.  OT Short Term Goals Week 1:  OT Short Term Goal 1 (Week 1): Pt will complete bathing with Min A  OT Short Term Goal 1 - Progress (Week 1): Met OT Short Term Goal 2 (Week 1): Pt will complete 1/3 components of donning pants OT Short Term Goal 2 - Progress (Week 1): Met OT Short Term Goal 3 (Week 1): Pt will don shirt with supervision  OT Short Term Goal 3 - Progress (Week 1): Met Week 2:  OT Short Term Goal 1 (Week 2): LTG=STG 2/2  ELOS  Skilled Therapeutic Interventions/Progress Updates:  Session 1  Pt greeted semi-reclined in bed eating breakfast with set-up A to open containers. Pt agitated stating she was not going to do any more therapy and perseverating on being unhappy with alarms. OT provided therapeutic listening and encouragement and pt eventually agreeable to participate. Pt came to sitting EOB with HOB elevated, min A to advance LLE and Mod A to elevate trunk. Pt very slow moving this am needing increased time for all motor movements. Attempted squat-pivot transfer to L and R and pt unable to power up for any clearance under bottom with Max A. Attempted sit<>stand with raised bed and RW x5 with pt unable to fully extend hips to achieve upright posture. Pt completed UB bathing/dressing seated EOB with set-up and verbal cues for posture. Returned to supine in bed to wash buttocks and pull up pants. Pt very lethargic toward the end of session and left semi-reclined in bed with needs met.   Session 2 Pt greeted in the hallway propelling wc slowly with nursing staff. Pt reported she was trying to find her room. She was leaning to the R in her wheelchair, drooling more, and had difficulty keeping her eyes open. Escorted pt to her room in wc and asked if she needed to go to the bathroom. Pt adamant that she did not need to urinate, but agreeable to OT treatment. OT propelled wc to therapy gym and addressed sit<>stand with RW. Pt unable to achieve full stand with RW. Provided pt with standing walker to promote upright posture and provided more facilitation at chest and hips.Squat-pivot  wc>mat with mod A. Utilized NDT techniques to promote postural alignment. Tried to engage pt in wii bowling activity, but pt unable to understand concept of game or motor plan movement. Pt with increased drooling, general fatigue, unable to keep her eyes open, slurring words, and leaning over to the R while seated on mat. Worked on squat-pivot  transfer back to wc with Mod A.  B UE coordination with wc propulsion. Tactile and verbal cues for L UE placement on wheel and cues to thrust forward. Pt then propelled wc into bathroom for toilet transfer. When pt saw urine collection device in commode, she became agitated, lifted the lid, threw out the urine collection hat and stated she wasn't going to the bathroom any more despite max encouragement from OT. Pt declined safety belt and was left seated in wc with bedside table over top of pt's lap, call bell in reach.   Therapy Documentation Precautions:  Precautions Precautions: Fall Precaution Comments: Right transmetatarsal amputation Restrictions Weight Bearing Restrictions: No  See Function Navigator for Current Functional Status.  Therapy/Group: Individual Therapy  Valma Cava 08/02/2017, 3:32 PM

## 2017-08-02 NOTE — Progress Notes (Signed)
Subjective/Complaints:  No issues overnite, per CNA , pt has beengetting up without assist  ROS: pt denies nausea, vomiting, diarrhea, cough, shortness of breath or chest pain   Objective: Vital Signs: Blood pressure 136/70, pulse 66, temperature 97.8 F (36.6 C), temperature source Oral, resp. rate 18, height 5\' 5"  (1.651 m), weight 78 kg (172 lb), SpO2 100 %. No results found. Results for orders placed or performed during the hospital encounter of 07/23/17 (from the past 72 hour(s))  Glucose, capillary     Status: Abnormal   Collection Time: 07/30/17 12:02 PM  Result Value Ref Range   Glucose-Capillary 125 (H) 65 - 99 mg/dL   Comment 1 Notify RN   Glucose, capillary     Status: Abnormal   Collection Time: 07/30/17  4:50 PM  Result Value Ref Range   Glucose-Capillary 348 (H) 65 - 99 mg/dL   Comment 1 Notify RN   Glucose, capillary     Status: Abnormal   Collection Time: 07/30/17  9:25 PM  Result Value Ref Range   Glucose-Capillary 124 (H) 65 - 99 mg/dL   Comment 1 Notify RN   Glucose, capillary     Status: Abnormal   Collection Time: 07/31/17  6:49 AM  Result Value Ref Range   Glucose-Capillary 168 (H) 65 - 99 mg/dL   Comment 1 Notify RN   Glucose, capillary     Status: Abnormal   Collection Time: 07/31/17 11:44 AM  Result Value Ref Range   Glucose-Capillary 196 (H) 65 - 99 mg/dL  Glucose, capillary     Status: Abnormal   Collection Time: 07/31/17  4:35 PM  Result Value Ref Range   Glucose-Capillary 246 (H) 65 - 99 mg/dL  Glucose, capillary     Status: Abnormal   Collection Time: 07/31/17  8:16 PM  Result Value Ref Range   Glucose-Capillary 138 (H) 65 - 99 mg/dL  Glucose, capillary     Status: Abnormal   Collection Time: 08/01/17  2:03 AM  Result Value Ref Range   Glucose-Capillary 139 (H) 65 - 99 mg/dL  Glucose, capillary     Status: None   Collection Time: 08/01/17  6:44 AM  Result Value Ref Range   Glucose-Capillary 96 65 - 99 mg/dL   Comment 1 Notify RN    Glucose, capillary     Status: Abnormal   Collection Time: 08/01/17 11:28 AM  Result Value Ref Range   Glucose-Capillary 114 (H) 65 - 99 mg/dL  Glucose, capillary     Status: Abnormal   Collection Time: 08/01/17  4:28 PM  Result Value Ref Range   Glucose-Capillary 283 (H) 65 - 99 mg/dL  Glucose, capillary     Status: Abnormal   Collection Time: 08/01/17  8:33 PM  Result Value Ref Range   Glucose-Capillary 176 (H) 65 - 99 mg/dL  Glucose, capillary     Status: Abnormal   Collection Time: 08/02/17  7:00 AM  Result Value Ref Range   Glucose-Capillary 123 (H) 65 - 99 mg/dL   Comment 1 Notify RN    Comment 2 Document in Chart      HEENT: mildly reduced neck ROM  Cardio: RRR without murmur. No JVD   Resp: CTA Bilaterally without wheezes or rales. Normal effort   GI: BS positive and non tender non distended Extremity:  Pulses positive and No Edema Skin:   Intact Neuro: Alert/Oriented, Normal Sensory, Abnormal Motor 4/r R delt, bi, tri, grip, 3/5 Left delt Bi tri grip, 5/5 RLE ,  4/5 LLE and Abnormal FMC Ataxic/ dec FMC Musc/Skel:  Other Right TMA Gen NAD, emotionally labile   Assessment/Plan: 1. Functional deficits secondary to R PLIC infarct with L HP which require 3+ hours per day of interdisciplinary therapy in a comprehensive inpatient rehab setting. Physiatrist is providing close team supervision and 24 hour management of active medical problems listed below. Physiatrist and rehab team continue to assess barriers to discharge/monitor patient progress toward functional and medical goals. FIM: Function - Bathing Position: Shower Body parts bathed by patient: Right arm, Left arm, Chest, Abdomen, Front perineal area, Right upper leg, Right lower leg, Left lower leg, Left upper leg Body parts bathed by helper: Back, Buttocks Assist Level: Touching or steadying assistance(Pt > 75%)  Function- Upper Body Dressing/Undressing What is the patient wearing?: Pull over shirt/dress Pull  over shirt/dress - Perfomed by patient: Thread/unthread right sleeve, Thread/unthread left sleeve, Put head through opening, Pull shirt over trunk Pull over shirt/dress - Perfomed by helper: Thread/unthread left sleeve, Put head through opening, Pull shirt over trunk Assist Level: Set up Function - Lower Body Dressing/Undressing What is the patient wearing?: Pants Position: Sitting EOB Underwear - Performed by helper: Thread/unthread left underwear leg, Thread/unthread right underwear leg, Pull underwear up/down Pants- Performed by patient: Thread/unthread right pants leg, Thread/unthread left pants leg Pants- Performed by helper: Pull pants up/down, Fasten/unfasten pants Non-skid slipper socks- Performed by patient: Don/doff left sock Non-skid slipper socks- Performed by helper: Don/doff right sock Assist for footwear: Partial/moderate assist Assist for lower body dressing: Touching or steadying assistance (Pt > 75%)  Function - Toileting Toileting activity did not occur: No continent bowel/bladder event Toileting steps completed by patient: Adjust clothing after toileting Toileting steps completed by helper: Adjust clothing prior to toileting, Performs perineal hygiene, Adjust clothing after toileting Toileting Assistive Devices: Grab bar or rail Assist level: Touching or steadying assistance (Pt.75%)  Function - Air cabin crew transfer activity did not occur: N/A Toilet transfer assistive device: Grab bar Assist level to toilet: Moderate assist (Pt 50 - 74%/lift or lower) Assist level from toilet: Moderate assist (Pt 50 - 74%/lift or lower)  Function - Chair/bed transfer Chair/bed transfer activity did not occur: Safety/medical concerns Chair/bed transfer method: Other Chair/bed transfer assist level: Maximal assist (Pt 25 - 49%/lift and lower) Chair/bed transfer assistive device: Armrests Chair/bed transfer details: Tactile cues for weight shifting, Verbal cues for  sequencing  Function - Locomotion: Wheelchair Type: Manual Max wheelchair distance: 149ft  Assist Level: Touching or steadying assistance (Pt > 75%) Assist Level: Supervision or verbal cues Assist Level: Touching or steadying assistance (Pt > 75%) Function - Locomotion: Ambulation Ambulation activity did not occur: N/A Assistive device: Walker-rolling Max distance: 47' Assist level: Touching or steadying assistance (Pt > 75%) Assist level: Touching or steadying assistance (Pt > 75%) Walk 50 feet with 2 turns activity did not occur: Safety/medical concerns Assist level: Touching or steadying assistance (Pt > 75%) Walk 150 feet activity did not occur: Safety/medical concerns Walk 10 feet on uneven surfaces activity did not occur: Safety/medical concerns  Function - Comprehension Comprehension: Auditory Comprehension assist level: Understands basic 75 - 89% of the time/ requires cueing 10 - 24% of the time  Function - Expression Expression: Verbal Expression assist level: Expresses basic 75 - 89% of the time/requires cueing 10 - 24% of the time. Needs helper to occlude trach/needs to repeat words.  Function - Social Interaction Social Interaction assist level: Interacts appropriately with others with medication or extra time (anti-anxiety, antidepressant).  Function - Problem Solving Problem solving assist level: Solves complex problems: With extra time  Function - Memory Memory assist level: More than reasonable amount of time Patient normally able to recall (first 3 days only): Current season, Location of own room, That he or she is in a hospital, Staff names and faces  Medical Problem List and Plan:  1. Functional deficits and left hemiparesis secondary to right PLIC infarct  -CIR PT, OT, SLP   -spoke to patient regarding safety awareness.  2. DVT Prophylaxis/Anticoagulation: Pharmaceutical: Lovenox  3. Pain Management: tylenol prn  4. Mood: LCSW to follow for evaluation  and support.  5. Neuropsych: This patient is capable of making decisions on her own behalf.  6. Skin/Wound Care: Continue pressure relief measures.  7. Fluids/Electrolytes/Nutrition: Encourage fluid intake. Check lytes in am.  8. HTN: Monitor BP bid -- improved lisinopril home dose of 20mg  Vitals:   08/02/17 0130 08/02/17 0637  BP: 127/62 136/70  Pulse: 62 66  Resp: 18 18  Temp: 97.6 F (36.4 C) 97.8 F (36.6 C)  SpO2: 93% 100%   9. T2DM: Poorly controlled with Hgb A1C- 9.2. Monitor BS ac/hs. Continue lantus 15 units. Resumed amaryl on 9.21. lantus increased to 25u on 9/21  -some improvement over last 24 hours , cont current dose Creat mildly elevated so no metformin rec CBG (last 3)   Recent Labs  08/01/17 1628 08/01/17 2033 08/02/17 0700  GLUCAP 283* 176* 123*     10. Acute on chronic renal failure?: Baseline SCr- 1.3- 1.4.now 1.65, enc fluids --11. H/o bipolar disorder: has been managed with klonopin tid.   11. Peripheral neuropathy: based on worsening of renal status pharmacy consult rec reducing  gabapentin 1200 mg tid to 700mg  BID  12. Substance abuse: team to have further conversations regarding drug use. Pt discussed this with neuropsych, I also explained the mechanisms by which cocaine increases stroke risk 13.  Neck pain improved  warm Kpad, sportscreme and tramadol 14.  Stools now formed. No bm this weekend yet 15.  Tobacco abuse   LOS (Days) 10 A FACE TO FACE EVALUATION WAS PERFORMED  KIRSTEINS,ANDREW E 08/02/2017, 7:18 AM

## 2017-08-02 NOTE — Progress Notes (Signed)
Physical Therapy Weekly Progress Note  Patient Details  Name: Tara Hopkins MRN: [id number] Date of Birth: 1951-08-03  Beginning of progress report period: July 25, 2017 End of progress report period: August 02, 2017  Today's Date: 08/02/2017 PT Individual Time: 0900-1000 PT Individual Time Calculation (min): 60 min   Patient has met 1 of 3 short term goals.  Pt has made slow progress with therapy and is currently requiring min/mod for transfers, min for gait up to 40', and supervision for w/c mobility.  Pt with ongoing challenges regarding safety awareness, frequently getting up without calling for assist, resulting in a fall over the weekend.    Patient continues to demonstrate the following deficits muscle weakness, unbalanced muscle activation and decreased coordination, decreased motor planning, decreased problem solving and decreased safety awareness and decreased sitting balance, decreased standing balance, decreased postural control, hemiplegia and decreased balance strategies and therefore will continue to benefit from skilled PT intervention to increase functional independence with mobility.  Patient progressing toward long term goals..  Plan of care revisions: downgraded standing/ambulation goals to min assist to reduce risk of falls once d/c home.  PT Short Term Goals Week 1:  PT Short Term Goal 1 (Week 1): pt will transfer with consistent min assist and LRAD PT Short Term Goal 1 - Progress (Week 1): Progressing toward goal PT Short Term Goal 2 (Week 1): pt will ambulate 63' with min assist  PT Short Term Goal 2 - Progress (Week 1): Not met PT Short Term Goal 3 (Week 1): pt will propel w/c 150 with min assist PT Short Term Goal 3 - Progress (Week 1): Met Week 2:  PT Short Term Goal 1 (Week 2): =LTGs due to ELOS  Skilled Therapeutic Interventions/Progress Updates:    no c/o pain but pt initially declining therapy.  Pt states she's in a bad mood from yesterday's events  and adamantly refused PT.  Therapist provided education of goals of therapy and recommendations for d/c home, as well as expected decline in function with refusal to participate potentially leading to change in d/c recommendation to SNF.  Pt continues to refuse.  SLP in to speak with pt and pt then agreeable to therapy.  Session focus on transfers and activity tolerance.    Pt transferred bed > w/c with mod A and increased time, requiring v/c for scooting back in chair. Pt states she does not want to do anything but agrees to NuStep x 10 min. Pt transferred w/c > NuStep with mod A and increased time with v/c for foot and hand placement. Pt rode NuStep x 10 min and required 1:1 contact for active participation and encouragement to increase stepping. Pt transferred NuStep > w/c with max A, increased time, and v/c for hand and foot placement throughout. Pt left up in w/c in room with call bell and all needs addressed.   Therapy Documentation Precautions:  Precautions Precautions: Fall Precaution Comments: Right transmetatarsal amputation Restrictions Weight Bearing Restrictions: No   See Function Navigator for Current Functional Status.  Therapy/Group: Individual Therapy  Michel Santee 08/02/2017, 10:14 AM

## 2017-08-03 ENCOUNTER — Inpatient Hospital Stay (HOSPITAL_COMMUNITY): Payer: Self-pay | Admitting: Physical Therapy

## 2017-08-03 ENCOUNTER — Inpatient Hospital Stay (HOSPITAL_COMMUNITY): Payer: Self-pay | Admitting: Occupational Therapy

## 2017-08-03 ENCOUNTER — Telehealth: Payer: Self-pay | Admitting: *Deleted

## 2017-08-03 NOTE — Telephone Encounter (Signed)
Unable to reach patient at time of TCM Call. Left message for patient to return call when available.  

## 2017-08-03 NOTE — Progress Notes (Signed)
Social Work Patient ID: Tara Hopkins, female   DOB: [birth year], 66 y.o.   MRN: [id number]   CSW noted this morning that pt left AMA yesterday prior to targeted d/c on 08-06-17.  Therefore, pt was not given any equipment or follow up therapies.  CSW noted in d/c summary that PA recommended pt f/u with her PCP within 24-48 hours.  CSW tried to talk to pt yesterday regarding some frustrations, but pt didn't want to talk at the time, but said she'd call CSW when she wanted to talk.  Pt agreed to stay until Friday with CSW, but that must have changed hours after our conversation.  Pt was more difficult to understand 08-02-17 and CSW told Reesa Chew, PA about this and she was going to work pt up to make sure she did not have a change in condition.  Pt apparently refused this and chose to leave AMA.

## 2017-08-03 NOTE — Progress Notes (Addendum)
Occupational Therapy Discharge Summary  Patient Details  Name: Tara Hopkins MRN: [id number] Date of Birth: 1951-08-22  Patient has met 4 of 11 long term goals due to improved activity tolerance, improved balance, postural control, ability to compensate for deficits, functional use of  LEFT upper and LEFT lower extremity and improved coordination.  Patient to discharge at overall Mod Assist/Max A level. Pt was doing well and experienced medical decline in the past few days. She refused medical work up and was often refusing therapy. Pt left against medical advice and would benefit from further OT services as she is requiring Mod/Max A for ADLs at this time.  Reasons goals not met: The patient did not meet 11 of her OT goals 2/2 pt decided to leave AMA and did not finish rehabilitation program.  Recommendation:  Patient will benefit from ongoing skilled OT services in home health setting to continue to advance functional skills in the area of BADL and Reduce care partner burden.  Equipment: No equipment provided  Reasons for discharge: Patient left AMA  OT Discharge Precautions/Restrictions  Precautions Precautions: Fall Restrictions Weight Bearing Restrictions: No ADL ADL Eating: Set up Grooming: Setup Upper Body Bathing: Supervision/safety Lower Body Bathing: Minimal assistance Upper Body Dressing: Supervision/safety Lower Body Dressing: Moderate assistance Toileting: Moderate assistance Toilet Transfer: Maximal assistance Toilet Transfer Equipment: Energy manager: Maximal Firefighter Method: Squat pivot ADL Comments: Please see functional navigator Perception  Perception: Within Functional Limits Praxis Praxis: Intact Cognition Overall Cognitive Status: History of cognitive impairments - at baseline Arousal/Alertness: Awake/alert Safety/Judgment: Impaired Sensation Sensation Light Touch: Appears Intact Coordination Gross  Motor Movements are Fluid and Coordinated: No Fine Motor Movements are Fluid and Coordinated: No Coordination and Movement Description: Improved L UE fine motor coordination, but continues to be slower with L UE Motor  Motor Motor: Hemiplegia;Abnormal postural alignment and control Motor - Discharge Observations: UB and LB weakness, lateral lean to R   See Function Navigator for Current Functional Status.  Daneen Schick Geniece Akers 08/03/2017, 2:13 PM

## 2017-08-03 NOTE — Progress Notes (Signed)
Physical Therapy Discharge Summary  Patient Details  Name: Tara Hopkins MRN: [id number] Date of Birth: 1951-06-25   Patient has met 3 of 10 long term goals.  Patient to discharge at a wheelchair level mod to max assist.  Pt did not complete rehab program and therefore did not meet majority of goals.  Pt with decline in functional mobility during most recent therapy sessions (on 9/24), requiring consistent mod to max assist for basic transfers, increased R lean in sitting, and decreased ability to manage secretions.  Medical team attempted work up, but pt adamantly refused and left AMA with grand daughter.    Recommendation:  Patient will benefit from ongoing skilled PT services in home health setting to continue to advance safe functional mobility, address ongoing impairments in balance, postural control, strength, coordination, motor planning, and safety awareness, and minimize fall risk.  Equipment: No equipment provided  Reasons for discharge: Pt left AMA   See Function Navigator for Current Functional Status.  Michel Santee 08/03/2017, 9:25 AM

## 2017-08-03 NOTE — Plan of Care (Signed)
Problem: RH Balance Goal: LTG Patient will maintain dynamic standing with ADLs (OT) LTG:  Patient will maintain dynamic standing balance with assist during activities of daily living (OT)   Outcome: Not Met (add Reason) Goal not met 2/2 pt leaving AMA and not finishing rehabilitation program  Problem: RH Bathing Goal: LTG Patient will bathe with assist, cues/equipment (OT) LTG: Patient will bathe specified number of body parts with assist with/without cues using equipment (position)  (OT)  Outcome: Not Met (add Reason) Goal not met 2/2 pt leaving AMA and not finishing rehabilitation program- pt needs min A for bathing  Problem: RH Dressing Goal: LTG Patient will perform lower body dressing w/assist (OT) LTG: Patient will perform lower body dressing with assist, with/without cues in positioning using equipment (OT)  Outcome: Not Met (add Reason) Goal not met 2/2 pt leaving AMA and not finishing rehabilitation program- pt needs mod A for LB dressing  Problem: RH Toileting Goal: LTG Patient will perform toileting w/assist, cues/equip (OT) LTG: Patient will perform toiletiing (clothes management/hygiene) with assist, with/without cues using equipment (OT)  Outcome: Not Met (add Reason) Goal not met 2/2 pt leaving AMA and not finishing rehabilitation program  Problem: RH Simple Meal Prep Goal: LTG Patient will perform simple meal prep w/assist (OT) LTG: Patient will perform simple meal prep with assistance, with/without cues (OT).  Outcome: Not Met (add Reason) Goal not met 2/2 pt leaving AMA and not finishing rehabilitation program  Problem: RH Toilet Transfers Goal: LTG Patient will perform toilet transfers w/assist (OT) LTG: Patient will perform toilet transfers with assist, with/without cues using equipment (OT)  Outcome: Not Met (add Reason) Goal not met 2/2 pt leaving AMA and not finishing rehabilitation program- Mod/Max A for transfers  Problem: RH Tub/Shower Transfers Goal:  LTG Patient will perform tub/shower transfers w/assist (OT) LTG: Patient will perform tub/shower transfers with assist, with/without cues using equipment (OT)  Outcome: Not Met (add Reason) Goal not met 2/2 pt leaving AMA and not finishing rehabilitation program mod/max

## 2017-08-09 ENCOUNTER — Other Ambulatory Visit: Payer: Self-pay | Admitting: Adult Health

## 2017-08-09 NOTE — Telephone Encounter (Signed)
Unable to reach patient at time of call.  Left message for patient to return call when available.

## 2017-08-11 ENCOUNTER — Ambulatory Visit (INDEPENDENT_AMBULATORY_CARE_PROVIDER_SITE_OTHER): Payer: Medicare Other | Admitting: Adult Health

## 2017-08-11 VITALS — BP 140/80 | Temp 98.2°F | Ht 65.0 in | Wt 184.0 lb

## 2017-08-11 DIAGNOSIS — I10 Essential (primary) hypertension: Secondary | ICD-10-CM

## 2017-08-11 DIAGNOSIS — I69354 Hemiplegia and hemiparesis following cerebral infarction affecting left non-dominant side: Secondary | ICD-10-CM | POA: Diagnosis not present

## 2017-08-11 DIAGNOSIS — F329 Major depressive disorder, single episode, unspecified: Secondary | ICD-10-CM | POA: Diagnosis not present

## 2017-08-11 DIAGNOSIS — F1210 Cannabis abuse, uncomplicated: Secondary | ICD-10-CM

## 2017-08-11 DIAGNOSIS — F32A Depression, unspecified: Secondary | ICD-10-CM

## 2017-08-11 DIAGNOSIS — I63331 Cerebral infarction due to thrombosis of right posterior cerebral artery: Secondary | ICD-10-CM

## 2017-08-11 DIAGNOSIS — N289 Disorder of kidney and ureter, unspecified: Secondary | ICD-10-CM | POA: Diagnosis not present

## 2017-08-11 LAB — CBC WITH DIFFERENTIAL/PLATELET
Basophils Absolute: 0.1 10*3/uL (ref 0.0–0.1)
Basophils Relative: 0.8 % (ref 0.0–3.0)
EOS PCT: 4.8 % (ref 0.0–5.0)
Eosinophils Absolute: 0.3 10*3/uL (ref 0.0–0.7)
HEMATOCRIT: 37.5 % (ref 36.0–46.0)
Hemoglobin: 12.2 g/dL (ref 12.0–15.0)
LYMPHS ABS: 2.7 10*3/uL (ref 0.7–4.0)
LYMPHS PCT: 39.5 % (ref 12.0–46.0)
MCHC: 32.7 g/dL (ref 30.0–36.0)
MCV: 89 fl (ref 78.0–100.0)
Monocytes Absolute: 0.3 10*3/uL (ref 0.1–1.0)
Monocytes Relative: 4.9 % (ref 3.0–12.0)
NEUTROS ABS: 3.4 10*3/uL (ref 1.4–7.7)
NEUTROS PCT: 50 % (ref 43.0–77.0)
PLATELETS: 370 10*3/uL (ref 150.0–400.0)
RBC: 4.21 Mil/uL (ref 3.87–5.11)
RDW: 15.3 % (ref 11.5–15.5)
WBC: 6.8 10*3/uL (ref 4.0–10.5)

## 2017-08-11 LAB — BASIC METABOLIC PANEL
BUN: 25 mg/dL — AB (ref 6–23)
CALCIUM: 9.6 mg/dL (ref 8.4–10.5)
CO2: 28 meq/L (ref 19–32)
CREATININE: 1.62 mg/dL — AB (ref 0.40–1.20)
Chloride: 108 mEq/L (ref 96–112)
GFR: 40.99 mL/min — ABNORMAL LOW (ref >60.00)
Glucose, Bld: 148 mg/dL — ABNORMAL HIGH (ref 70–99)
Potassium: 4.5 mEq/L (ref 3.5–5.1)
Sodium: 143 mEq/L (ref 135–145)

## 2017-08-11 MED ORDER — CITALOPRAM HYDROBROMIDE 10 MG PO TABS
10.0000 mg | ORAL_TABLET | Freq: Every day | ORAL | 3 refills | Status: DC
Start: 2017-08-11 — End: 2017-11-24

## 2017-08-11 NOTE — Telephone Encounter (Signed)
Called to the pharmacy and left on machine. 

## 2017-08-11 NOTE — Progress Notes (Signed)
Subjective:    Patient ID: Tara Hopkins, female    DOB: 01/14/1951, 66 y.o.   MRN: [id number]  HPI  66 year old female who  has a past medical history of Anemia; Anxiety; Bipolar 1 disorder ([place]); Cataracts, bilateral; Depression; Diabetes mellitus without complication ([place]); Glaucoma; History of blood transfusion; History of hyperbaric oxygen therapy; Hypercholesteremia; Hypertension; Peripheral neuropathy; and Stroke ([place]) (02/2015).   She presents to the clinic today for TCM   Admit Date 07/24/19`8  Discharge Date: 08/02/2017   She was admitted to [facility] with worsening of left sided weakness. MRI of brain revealed acute/subacute infarct in right postrior limb of internal capsule and moderate parenchymal volume loss of brain with moderate SVD. MRA brain/neck showed intracranial stenosis affecting left PCA with high grade stenosis P2 branches, 40% stenosis L-ICA, 60% stensis L-SA and bilateral high grade vertebral origin stenosis. 2D echo showed EF 55-60% with no wall abnormality and grade 2 diastolic dysfunction. UDS positive for THC and patient admitted to using cocaine 3-4 weeks ago.  She was started on Plavix while in the hospital and reports today that she is taking this as prescribed   Lamictal 100 mg was discontinued   She is to follow up with Neurology ( Dr. [name]) and Dr. Letta Pate ( Physical Medicine and Rehab)   In the office today she is tearful through much of the exam. She is disappointed in herself and is discouraged that she has to "start over". She mentions that she signed out AMA from the hospital because " they were working me hard, and I did not feel like I was benefiting from the therapy." She is upset with herself that she signed out [place].   She reports to me that she was homeless for a short while before the stroke and was sleeping in homeless shelters. She is currently in an apartment and is happy with her living situation. She has not been receiving any  help from her estranged husband nor daughter, and this upset the patient as well. She does state that she is getting some minimal assistance from her sister, but more times than not she has to take [employer] bus to her appointments.   [name] does not feel as though she is recovering well. She continues to have left sided lower extremity deficits, and she is unable to walk without feeling as though she is going fall. She has been using a wheelchair but finds this hard to navigate. She did have a walker but this was not taken from the hospital when she signed out AMA.   She also expresses depression since the stroke. She denies any suicidal ideation. The depression stems from her frustration and " feeling overwhelmed with everything that I need to go to get back to where I was before".   She denies any chest pain, shortness of breath, headaches, or blurred vision. She has not noticed any bruising or sustained falls since being released and placed on Eliquis    Review of Systems  Constitutional: Positive for activity change. Negative for appetite change.  HENT: Negative.   Eyes: Negative.   Respiratory: Negative.   Cardiovascular: Negative.   Gastrointestinal: Negative.   Genitourinary: Negative.   Musculoskeletal: Positive for arthralgias and gait problem.  Skin: Negative.   Hematological: Negative.   Psychiatric/Behavioral: Positive for sleep disturbance. Negative for suicidal ideas. The patient is not nervous/anxious.        Depressed        Past Medical History:  Diagnosis Date  . Anemia   . Anxiety   . Bipolar 1 disorder ([place])   . Cataracts, bilateral   . Depression   . Diabetes mellitus without complication ([place])   . Glaucoma   . History of blood transfusion   . History of hyperbaric oxygen therapy    pt reports 80 treatments.  . Hypercholesteremia   . Hypertension   . Peripheral neuropathy   . Stroke [facility]) 02/2015    Social History   Social History  . Marital status:  Married    Spouse name: N/A  . Number of children: 3  . Years of education: 14   Occupational History  . Disbaled    Social History Main Topics  . Smoking status: Current Every Day Smoker    Packs/day: 0.01    Years: 20.00    Types: Cigarettes  . Smokeless tobacco: Never Used  . Alcohol use 0.0 oz/week     Comment: beer every 3 months  . Drug use: No  . Sexual activity: Not on file   Other Topics Concern  . Not on file   Social History Narrative   Lives at home alone.   Right-handed.   No caffeine use.   Oldest of 12 children        Past Surgical History:  Procedure Laterality Date  . AMPUTATION Right 12/15/2013   Procedure: Right Transmetatarsal Amputation;  Surgeon: Newt Minion, MD;  Location: [facility];  Service: Orthopedics;  Laterality: Right;  . AMPUTATION Right 02/23/2014   Procedure: AMPUTATION FOOT;  Surgeon: Newt Minion, MD;  Location: [facility];  Service: Orthopedics;  Laterality: Right;  Right Foot Revision Transmetatarsal Amputation  . I&D EXTREMITY Right 05/23/2013   Procedure: IRRIGATION AND DEBRIDEMENT RIGHT GREAT TOE;  Surgeon: Mauri Pole, MD;  Location: [facility];  Service: Orthopedics;  Laterality: Right;    Family History  Problem Relation Age of Onset  . Liver cancer Mother   . Lung cancer Mother   . Depression Mother   . Hypertension Father   . Depression Father   . Colon cancer Neg Hx     No Known Allergies  Current Outpatient Prescriptions on File Prior to Visit  Medication Sig Dispense Refill  . atorvastatin (LIPITOR) 40 MG tablet Take 1 tablet (40 mg total) by mouth daily at 6 PM. 30 tablet 0  . clonazePAM (KLONOPIN) 0.5 MG tablet take 1 tablet by mouth three times a day 90 tablet 0  . clopidogrel (PLAVIX) 75 MG tablet Take 1 tablet (75 mg total) by mouth daily. 30 tablet 3  . gabapentin (NEURONTIN) 400 MG capsule Take 2 capsules (800 mg total) by mouth 2 (two) times daily. 270 capsule 5  . glimepiride (AMARYL) 4 MG tablet take 1 tablet  once daily 90 tablet 3  . glucose blood test strip Test blood sugars 3 times daily Dx: E11.42 100 each 12  . Insulin Glargine (BASAGLAR KWIKPEN) 100 UNIT/ML SOPN Inject 0.25 mLs (25 Units total) into the skin at bedtime. 9 mL 6  . Insulin Pen Needle (B-D UF III MINI PEN NEEDLES) 31G X 5 MM MISC Use as directed. 100 each 3  . latanoprost (XALATAN) 0.005 % ophthalmic solution Place 1 drop into both eyes at bedtime. 2.5 mL 12  . lisinopril (PRINIVIL,ZESTRIL) 20 MG tablet Take 1 tablet (20 mg total) by mouth daily. 90 tablet 3  . ONE TOUCH LANCETS MISC USE TO CHECK BLOOD SUGAR 3 TIMES DAILY 200 each 3  .  trolamine salicylate (ASPERCREME) 10 % cream Apply topically 2 (two) times daily as needed for muscle pain. 85 g 0   No current facility-administered medications on file prior to visit.     BP 140/80 (BP Location: Right Arm)   Temp 98.2 F (36.8 C) (Oral)   Ht 5' 5"  (1.651 m)   Wt 184 lb (83.5 kg)   BMI 30.62 kg/m       Objective:   Physical Exam  Constitutional: She is oriented to person, place, and time. She appears well-developed and well-nourished. No distress.  Tearful during much of exam    Cardiovascular: Normal rate, regular rhythm, normal heart sounds and intact distal pulses.  Exam reveals no gallop and no friction rub.   No murmur heard. Pulmonary/Chest: Effort normal and breath sounds normal. No respiratory distress. She has no wheezes. She exhibits no tenderness.  Musculoskeletal: She exhibits no edema or tenderness.  Unsteady on feet. Very off balance when trying to stand from wheelchair   Neurological: She is alert and oriented to person, place, and time. She displays normal reflexes. A sensory deficit (lower extremities ) is present. No cranial nerve deficit. She exhibits abnormal muscle tone. Coordination and gait abnormal.  Reflex Scores:      Tricep reflexes are 3+ on the right side and 2+ on the left side.      Patellar reflexes are 3+ on the right side and 3+ on  the left side.      Achilles reflexes are 3+ on the right side and 3+ on the left side. Skin: Skin is warm and dry. No rash noted. She is not diaphoretic. No erythema. No pallor.  Psychiatric: Her speech is normal and behavior is normal. Judgment and thought content normal. Cognition and memory are normal. She exhibits a depressed mood.  Nursing note and vitals reviewed.      Assessment & Plan:  1. Cerebrovascular accident (CVA) due to thrombosis of right posterior cerebral artery ([place]) - She would benefit from a motorized wheel chair and walker.  - I am going to refer her to home health and PT if needed. She is waiting to hear from [place] to call Neurology and schedule an appointment, phone number given  - Ambulatory referral to [facility] with Differential/Platelet - BMP with eGFR - DME Wheelchair electric - DME Walker platform   2. Essential hypertension - Not at goal. Will continue to monitor  - CBC with Differential/Platelet - BMP with eGFR  3. Renal insufficiency  - CBC with Differential/Platelet - BMP with eGFR - Basic metabolic panel; Future - Basic metabolic panel  4. Depression, unspecified depression type - Will start on low dose celexa  - citalopram (CELEXA) 10 MG tablet; Take 1 tablet (10 mg total) by mouth daily.  Dispense: 30 tablet; Refill: 3  5. Mild tetrahydrocannabinol (THC) abuse - Advised to no longer use illicit drugs   [name], NP

## 2017-08-11 NOTE — Telephone Encounter (Signed)
Ok to refill for 30 days  

## 2017-08-11 NOTE — Patient Instructions (Signed)
Someone from [facility] is going to call you to schedule their appointment with you   I am going to get some blood work on you today   Please call [facility] today to schedule your appointment   [phone]

## 2017-08-11 NOTE — Telephone Encounter (Signed)
Last filled on 06/29/17 #90 Seen today for hospital follow up Scheduled for follow up on 09/21/17 Please advise.  Thanks!!

## 2017-08-12 DIAGNOSIS — E1143 Type 2 diabetes mellitus with diabetic autonomic (poly)neuropathy: Secondary | ICD-10-CM | POA: Diagnosis not present

## 2017-08-12 DIAGNOSIS — F17210 Nicotine dependence, cigarettes, uncomplicated: Secondary | ICD-10-CM | POA: Diagnosis not present

## 2017-08-12 DIAGNOSIS — D649 Anemia, unspecified: Secondary | ICD-10-CM | POA: Diagnosis not present

## 2017-08-12 DIAGNOSIS — M6281 Muscle weakness (generalized): Secondary | ICD-10-CM | POA: Diagnosis not present

## 2017-08-12 DIAGNOSIS — I10 Essential (primary) hypertension: Secondary | ICD-10-CM | POA: Diagnosis not present

## 2017-08-12 DIAGNOSIS — I69354 Hemiplegia and hemiparesis following cerebral infarction affecting left non-dominant side: Secondary | ICD-10-CM | POA: Diagnosis not present

## 2017-08-13 ENCOUNTER — Telehealth: Payer: Self-pay | Admitting: Emergency Medicine

## 2017-08-13 DIAGNOSIS — M6281 Muscle weakness (generalized): Secondary | ICD-10-CM | POA: Diagnosis not present

## 2017-08-13 DIAGNOSIS — I69354 Hemiplegia and hemiparesis following cerebral infarction affecting left non-dominant side: Secondary | ICD-10-CM | POA: Diagnosis not present

## 2017-08-13 DIAGNOSIS — I10 Essential (primary) hypertension: Secondary | ICD-10-CM | POA: Diagnosis not present

## 2017-08-13 DIAGNOSIS — E1143 Type 2 diabetes mellitus with diabetic autonomic (poly)neuropathy: Secondary | ICD-10-CM | POA: Diagnosis not present

## 2017-08-13 DIAGNOSIS — D649 Anemia, unspecified: Secondary | ICD-10-CM | POA: Diagnosis not present

## 2017-08-13 DIAGNOSIS — F17210 Nicotine dependence, cigarettes, uncomplicated: Secondary | ICD-10-CM | POA: Diagnosis not present

## 2017-08-13 NOTE — Telephone Encounter (Signed)
Flonnie Hailstone for [facility] called needing verbal orders for OT  once a week for 4 weeks regarding ADL independence, Home Safety, and Strengthen.    Call back number [phone]

## 2017-08-15 NOTE — Telephone Encounter (Signed)
Ok for verbal orders ?

## 2017-08-16 ENCOUNTER — Telehealth: Payer: Self-pay | Admitting: Adult Health

## 2017-08-16 DIAGNOSIS — M6281 Muscle weakness (generalized): Secondary | ICD-10-CM | POA: Diagnosis not present

## 2017-08-16 DIAGNOSIS — I10 Essential (primary) hypertension: Secondary | ICD-10-CM | POA: Diagnosis not present

## 2017-08-16 DIAGNOSIS — F17210 Nicotine dependence, cigarettes, uncomplicated: Secondary | ICD-10-CM | POA: Diagnosis not present

## 2017-08-16 DIAGNOSIS — E1143 Type 2 diabetes mellitus with diabetic autonomic (poly)neuropathy: Secondary | ICD-10-CM | POA: Diagnosis not present

## 2017-08-16 DIAGNOSIS — D649 Anemia, unspecified: Secondary | ICD-10-CM | POA: Diagnosis not present

## 2017-08-16 DIAGNOSIS — I69354 Hemiplegia and hemiparesis following cerebral infarction affecting left non-dominant side: Secondary | ICD-10-CM | POA: Diagnosis not present

## 2017-08-16 NOTE — Telephone Encounter (Signed)
Pt notified that rx was sent in on 08/11/17 and order for electric wheelchair was placed.  She should call back at the end of the week if she has not heard from [facility].

## 2017-08-16 NOTE — Telephone Encounter (Signed)
Left a message for a return call.  Order was sent to [facility].  May take several days.  Pt needs to be notified.  Can call back at the end of the week if she has not heard from [facility].

## 2017-08-16 NOTE — Telephone Encounter (Signed)
° ° ° °  Pt call and ask if paperwork is complete for her motorize wheelchair

## 2017-08-16 NOTE — Telephone Encounter (Signed)
Remo Lipps notified to proceed with orders.

## 2017-08-16 NOTE — Telephone Encounter (Signed)
Tara Hopkins pt is calling to check the status of Rx clonazepam and her motorized wheelchair.

## 2017-08-17 DIAGNOSIS — D649 Anemia, unspecified: Secondary | ICD-10-CM | POA: Diagnosis not present

## 2017-08-17 DIAGNOSIS — M6281 Muscle weakness (generalized): Secondary | ICD-10-CM | POA: Diagnosis not present

## 2017-08-17 DIAGNOSIS — I10 Essential (primary) hypertension: Secondary | ICD-10-CM | POA: Diagnosis not present

## 2017-08-17 DIAGNOSIS — I69354 Hemiplegia and hemiparesis following cerebral infarction affecting left non-dominant side: Secondary | ICD-10-CM | POA: Diagnosis not present

## 2017-08-17 DIAGNOSIS — E1143 Type 2 diabetes mellitus with diabetic autonomic (poly)neuropathy: Secondary | ICD-10-CM | POA: Diagnosis not present

## 2017-08-17 DIAGNOSIS — F17210 Nicotine dependence, cigarettes, uncomplicated: Secondary | ICD-10-CM | POA: Diagnosis not present

## 2017-08-18 ENCOUNTER — Telehealth: Payer: Self-pay | Admitting: Adult Health

## 2017-08-18 NOTE — Telephone Encounter (Signed)
[facility] is calling to let the office know that they have not been able to get in touch with the pt so that they are able to do home health.  They have left several msgs and pt has not returned their call but they will continue to try to get in touch with pt.

## 2017-08-18 NOTE — Telephone Encounter (Signed)
Noted  

## 2017-08-19 ENCOUNTER — Telehealth: Payer: Self-pay | Admitting: Adult Health

## 2017-08-19 ENCOUNTER — Other Ambulatory Visit: Payer: Self-pay | Admitting: Adult Health

## 2017-08-19 DIAGNOSIS — I10 Essential (primary) hypertension: Secondary | ICD-10-CM | POA: Diagnosis not present

## 2017-08-19 DIAGNOSIS — D649 Anemia, unspecified: Secondary | ICD-10-CM | POA: Diagnosis not present

## 2017-08-19 DIAGNOSIS — M6281 Muscle weakness (generalized): Secondary | ICD-10-CM | POA: Diagnosis not present

## 2017-08-19 DIAGNOSIS — E1143 Type 2 diabetes mellitus with diabetic autonomic (poly)neuropathy: Secondary | ICD-10-CM | POA: Diagnosis not present

## 2017-08-19 DIAGNOSIS — F17210 Nicotine dependence, cigarettes, uncomplicated: Secondary | ICD-10-CM | POA: Diagnosis not present

## 2017-08-19 DIAGNOSIS — I69354 Hemiplegia and hemiparesis following cerebral infarction affecting left non-dominant side: Secondary | ICD-10-CM | POA: Diagnosis not present

## 2017-08-19 MED ORDER — DOXYCYCLINE HYCLATE 100 MG PO CAPS: 100.0000 mg | ORAL_CAPSULE | Freq: Two times a day (BID) | ORAL | 0 refills | Status: DC

## 2017-08-19 NOTE — Telephone Encounter (Signed)
[name] with [facility] called to advise pt has a fall today.  EMS came and helped the pt up.  Pt is without injury. However, [name] states pt does have a stage 3 wound on right big toe.it has been there a week.

## 2017-08-19 NOTE — Telephone Encounter (Signed)
Spoke with pt and advised Cory sent in abx he would like to start for her toe sore. Pt verbalized understanding. Also, a follow up appointment was made for pt to return in one week per [facility].

## 2017-08-19 NOTE — Telephone Encounter (Signed)
Called and spoke to [place].  Advised that Tara Hopkins has sent in rx for doxy and pt should follow up in 1 week.  Informed [name] that I did try to reach the pt myself but no answer.  [name] did state that the pt does not answer her phone.  [name] to get in touch with pt.

## 2017-08-19 NOTE — Telephone Encounter (Signed)
I have sent in a prescription for doxycycline as she is a diabetic and has already had toes amputated. Please have her follow up in one week for wound check or sooner if needed

## 2017-08-20 DIAGNOSIS — I69354 Hemiplegia and hemiparesis following cerebral infarction affecting left non-dominant side: Secondary | ICD-10-CM | POA: Diagnosis not present

## 2017-08-20 DIAGNOSIS — F17210 Nicotine dependence, cigarettes, uncomplicated: Secondary | ICD-10-CM | POA: Diagnosis not present

## 2017-08-20 DIAGNOSIS — M6281 Muscle weakness (generalized): Secondary | ICD-10-CM | POA: Diagnosis not present

## 2017-08-20 DIAGNOSIS — I10 Essential (primary) hypertension: Secondary | ICD-10-CM | POA: Diagnosis not present

## 2017-08-20 DIAGNOSIS — D649 Anemia, unspecified: Secondary | ICD-10-CM | POA: Diagnosis not present

## 2017-08-20 DIAGNOSIS — E1143 Type 2 diabetes mellitus with diabetic autonomic (poly)neuropathy: Secondary | ICD-10-CM | POA: Diagnosis not present

## 2017-08-23 DIAGNOSIS — I10 Essential (primary) hypertension: Secondary | ICD-10-CM | POA: Diagnosis not present

## 2017-08-23 DIAGNOSIS — I69354 Hemiplegia and hemiparesis following cerebral infarction affecting left non-dominant side: Secondary | ICD-10-CM | POA: Diagnosis not present

## 2017-08-23 DIAGNOSIS — D649 Anemia, unspecified: Secondary | ICD-10-CM | POA: Diagnosis not present

## 2017-08-23 DIAGNOSIS — F17210 Nicotine dependence, cigarettes, uncomplicated: Secondary | ICD-10-CM | POA: Diagnosis not present

## 2017-08-23 DIAGNOSIS — M6281 Muscle weakness (generalized): Secondary | ICD-10-CM | POA: Diagnosis not present

## 2017-08-23 DIAGNOSIS — E1143 Type 2 diabetes mellitus with diabetic autonomic (poly)neuropathy: Secondary | ICD-10-CM | POA: Diagnosis not present

## 2017-08-23 NOTE — Progress Notes (Deleted)
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

## 2017-08-25 DIAGNOSIS — D649 Anemia, unspecified: Secondary | ICD-10-CM | POA: Diagnosis not present

## 2017-08-25 DIAGNOSIS — F17210 Nicotine dependence, cigarettes, uncomplicated: Secondary | ICD-10-CM | POA: Diagnosis not present

## 2017-08-25 DIAGNOSIS — I10 Essential (primary) hypertension: Secondary | ICD-10-CM | POA: Diagnosis not present

## 2017-08-25 DIAGNOSIS — I69354 Hemiplegia and hemiparesis following cerebral infarction affecting left non-dominant side: Secondary | ICD-10-CM | POA: Diagnosis not present

## 2017-08-25 DIAGNOSIS — E1143 Type 2 diabetes mellitus with diabetic autonomic (poly)neuropathy: Secondary | ICD-10-CM | POA: Diagnosis not present

## 2017-08-25 DIAGNOSIS — M6281 Muscle weakness (generalized): Secondary | ICD-10-CM | POA: Diagnosis not present

## 2017-08-26 DIAGNOSIS — I10 Essential (primary) hypertension: Secondary | ICD-10-CM | POA: Diagnosis not present

## 2017-08-26 DIAGNOSIS — M6281 Muscle weakness (generalized): Secondary | ICD-10-CM | POA: Diagnosis not present

## 2017-08-26 DIAGNOSIS — D649 Anemia, unspecified: Secondary | ICD-10-CM | POA: Diagnosis not present

## 2017-08-26 DIAGNOSIS — F17210 Nicotine dependence, cigarettes, uncomplicated: Secondary | ICD-10-CM | POA: Diagnosis not present

## 2017-08-26 DIAGNOSIS — E1143 Type 2 diabetes mellitus with diabetic autonomic (poly)neuropathy: Secondary | ICD-10-CM | POA: Diagnosis not present

## 2017-08-26 DIAGNOSIS — I69354 Hemiplegia and hemiparesis following cerebral infarction affecting left non-dominant side: Secondary | ICD-10-CM | POA: Diagnosis not present

## 2017-08-27 ENCOUNTER — Ambulatory Visit (INDEPENDENT_AMBULATORY_CARE_PROVIDER_SITE_OTHER): Payer: Medicare Other | Admitting: Adult Health

## 2017-08-27 ENCOUNTER — Encounter: Payer: Self-pay | Admitting: Adult Health

## 2017-08-27 VITALS — BP 164/80 | HR 65 | Temp 98.3°F

## 2017-08-27 DIAGNOSIS — E1142 Type 2 diabetes mellitus with diabetic polyneuropathy: Secondary | ICD-10-CM | POA: Diagnosis not present

## 2017-08-27 DIAGNOSIS — S91109A Unspecified open wound of unspecified toe(s) without damage to nail, initial encounter: Secondary | ICD-10-CM

## 2017-08-27 DIAGNOSIS — I63331 Cerebral infarction due to thrombosis of right posterior cerebral artery: Secondary | ICD-10-CM | POA: Diagnosis not present

## 2017-08-27 MED ORDER — DOXYCYCLINE HYCLATE 100 MG PO CAPS: 100.0000 mg | ORAL_CAPSULE | Freq: Two times a day (BID) | ORAL | 0 refills | Status: DC

## 2017-08-27 MED ORDER — GLUCOSE BLOOD VI STRP: ORAL_STRIP | 12 refills | Status: DC

## 2017-08-27 NOTE — Progress Notes (Signed)
Subjective:    Patient ID: Tara Hopkins, female    DOB: Sep 11, 1951, 66 y.o.   MRN: [id number]  HPI  66 year old female who  has a past medical history of Anemia; Anxiety; Bipolar 1 disorder ([place]); Cataracts, bilateral; Depression; Diabetes mellitus without complication ([place]); Glaucoma; History of blood transfusion; History of hyperbaric oxygen therapy; Hypercholesteremia; Hypertension; Peripheral neuropathy; and Stroke ([place]) (02/2015).  She presents to the office today for follow up of wound to diabetic foot. She sustained a fall and I was notified one week ago by her home health agency that she had a wound on her left great toe. A course of doxycycline was prescribed. Today in the office she reports that her wound appears to be healing well. She has been having home health help with dressing changes. Denies any pain or discharge.   She does report she has an appointment with wound care in November   She needs refills on testing strips   Review of Systems See HPI   Past Medical History:  Diagnosis Date  . Anemia   . Anxiety   . Bipolar 1 disorder ([place])   . Cataracts, bilateral   . Depression   . Diabetes mellitus without complication ([place])   . Glaucoma   . History of blood transfusion   . History of hyperbaric oxygen therapy    pt reports 80 treatments.  . Hypercholesteremia   . Hypertension   . Peripheral neuropathy   . Stroke [facility]) 02/2015    Social History   Social History  . Marital status: Married    Spouse name: N/A  . Number of children: 3  . Years of education: 14   Occupational History  . Disbaled    Social History Main Topics  . Smoking status: Current Every Day Smoker    Packs/day: 0.01    Years: 20.00    Types: Cigarettes  . Smokeless tobacco: Never Used  . Alcohol use 0.0 oz/week     Comment: beer every 3 months  . Drug use: No  . Sexual activity: Not on file   Other Topics Concern  . Not on file   Social History Narrative   Lives at home  alone.   Right-handed.   No caffeine use.   Oldest of 12 children        Past Surgical History:  Procedure Laterality Date  . AMPUTATION Right 12/15/2013   Procedure: Right Transmetatarsal Amputation;  Surgeon: Newt Minion, MD;  Location: [facility];  Service: Orthopedics;  Laterality: Right;  . AMPUTATION Right 02/23/2014   Procedure: AMPUTATION FOOT;  Surgeon: Newt Minion, MD;  Location: [facility];  Service: Orthopedics;  Laterality: Right;  Right Foot Revision Transmetatarsal Amputation  . I&D EXTREMITY Right 05/23/2013   Procedure: IRRIGATION AND DEBRIDEMENT RIGHT GREAT TOE;  Surgeon: Mauri Pole, MD;  Location: [facility];  Service: Orthopedics;  Laterality: Right;    Family History  Problem Relation Age of Onset  . Liver cancer Mother   . Lung cancer Mother   . Depression Mother   . Hypertension Father   . Depression Father   . Colon cancer Neg Hx     No Known Allergies  Current Outpatient Prescriptions on File Prior to Visit  Medication Sig Dispense Refill  . atorvastatin (LIPITOR) 40 MG tablet Take 1 tablet (40 mg total) by mouth daily at 6 PM. 30 tablet 0  . citalopram (CELEXA) 10 MG tablet Take 1 tablet (10 mg total) by mouth  daily. 30 tablet 3  . clonazePAM (KLONOPIN) 0.5 MG tablet take 1 tablet by mouth three times a day 90 tablet 0  . clopidogrel (PLAVIX) 75 MG tablet Take 1 tablet (75 mg total) by mouth daily. 30 tablet 3  . doxycycline (VIBRAMYCIN) 100 MG capsule Take 1 capsule (100 mg total) by mouth 2 (two) times daily. 14 capsule 0  . gabapentin (NEURONTIN) 400 MG capsule Take 2 capsules (800 mg total) by mouth 2 (two) times daily. 270 capsule 5  . glimepiride (AMARYL) 4 MG tablet take 1 tablet once daily 90 tablet 3  . glucose blood test strip Test blood sugars 3 times daily Dx: E11.42 100 each 12  . Insulin Glargine (BASAGLAR KWIKPEN) 100 UNIT/ML SOPN Inject 0.25 mLs (25 Units total) into the skin at bedtime. 9 mL 6  . Insulin Pen Needle (B-D UF III MINI PEN NEEDLES)  31G X 5 MM MISC Use as directed. 100 each 3  . latanoprost (XALATAN) 0.005 % ophthalmic solution Place 1 drop into both eyes at bedtime. 2.5 mL 12  . lisinopril (PRINIVIL,ZESTRIL) 20 MG tablet Take 1 tablet (20 mg total) by mouth daily. 90 tablet 3  . ONE TOUCH LANCETS MISC USE TO CHECK BLOOD SUGAR 3 TIMES DAILY 200 each 3  . trolamine salicylate (ASPERCREME) 10 % cream Apply topically 2 (two) times daily as needed for muscle pain. 85 g 0   No current facility-administered medications on file prior to visit.     BP (!) 164/80 (BP Location: Right Arm)   Pulse 65   Temp 98.3 F (36.8 C)   SpO2 97%       Objective:   Physical Exam  Constitutional: She is oriented to person, place, and time. She appears well-developed and well-nourished. No distress.  Cardiovascular: Normal rate, regular rhythm, normal heart sounds and intact distal pulses.  Exam reveals no gallop and no friction rub.   No murmur heard. Pulmonary/Chest: Effort normal and breath sounds normal. No respiratory distress. She has no wheezes. She has no rales. She exhibits no tenderness.  Neurological: She is alert and oriented to person, place, and time.  Skin: Skin is warm and dry. No rash noted. She is not diaphoretic. There is erythema. No pallor.  Well healing wound on dorsal aspect of left great toe. No drainage noted. Has pink granulation tissue in center of wound. Slight redness noted along outside of wound   Psychiatric: She has a normal mood and affect. Her behavior is normal. Judgment and thought content normal.  Nursing note and vitals reviewed.     Assessment & Plan:  1. Open wound of toe, initial encounter - I am going to do another round of doxycycline to resolve infection. Follow up if not completely resolved by the end of antibiotic treatment  - doxycycline (VIBRAMYCIN) 100 MG capsule; Take 1 capsule (100 mg total) by mouth 2 (two) times daily.  Dispense: 14 capsule; Refill: 0  2. DM type 2 with diabetic  peripheral neuropathy (HCC)  - glucose blood test strip; Use as instructed  Dispense: 100 each; Refill: [place], NP

## 2017-09-01 DIAGNOSIS — E1143 Type 2 diabetes mellitus with diabetic autonomic (poly)neuropathy: Secondary | ICD-10-CM | POA: Diagnosis not present

## 2017-09-01 DIAGNOSIS — I10 Essential (primary) hypertension: Secondary | ICD-10-CM | POA: Diagnosis not present

## 2017-09-01 DIAGNOSIS — I69354 Hemiplegia and hemiparesis following cerebral infarction affecting left non-dominant side: Secondary | ICD-10-CM | POA: Diagnosis not present

## 2017-09-01 DIAGNOSIS — D649 Anemia, unspecified: Secondary | ICD-10-CM | POA: Diagnosis not present

## 2017-09-01 DIAGNOSIS — M6281 Muscle weakness (generalized): Secondary | ICD-10-CM | POA: Diagnosis not present

## 2017-09-01 DIAGNOSIS — F17210 Nicotine dependence, cigarettes, uncomplicated: Secondary | ICD-10-CM | POA: Diagnosis not present

## 2017-09-02 ENCOUNTER — Telehealth: Payer: Self-pay | Admitting: Adult Health

## 2017-09-02 DIAGNOSIS — E1143 Type 2 diabetes mellitus with diabetic autonomic (poly)neuropathy: Secondary | ICD-10-CM | POA: Diagnosis not present

## 2017-09-02 DIAGNOSIS — I10 Essential (primary) hypertension: Secondary | ICD-10-CM | POA: Diagnosis not present

## 2017-09-02 DIAGNOSIS — F17210 Nicotine dependence, cigarettes, uncomplicated: Secondary | ICD-10-CM | POA: Diagnosis not present

## 2017-09-02 DIAGNOSIS — M6281 Muscle weakness (generalized): Secondary | ICD-10-CM | POA: Diagnosis not present

## 2017-09-02 DIAGNOSIS — D649 Anemia, unspecified: Secondary | ICD-10-CM | POA: Diagnosis not present

## 2017-09-02 DIAGNOSIS — I69354 Hemiplegia and hemiparesis following cerebral infarction affecting left non-dominant side: Secondary | ICD-10-CM | POA: Diagnosis not present

## 2017-09-02 NOTE — Telephone Encounter (Signed)
Tara Hopkins with [facility] called to advise pt cancelled her nurse visit for today.  Also, if you want them to do wound care on her foot, they will need orders faxed to:  Fax:  [phone]

## 2017-09-06 ENCOUNTER — Telehealth: Payer: Self-pay | Admitting: Adult Health

## 2017-09-06 DIAGNOSIS — M6281 Muscle weakness (generalized): Secondary | ICD-10-CM | POA: Diagnosis not present

## 2017-09-06 DIAGNOSIS — D649 Anemia, unspecified: Secondary | ICD-10-CM | POA: Diagnosis not present

## 2017-09-06 DIAGNOSIS — E1143 Type 2 diabetes mellitus with diabetic autonomic (poly)neuropathy: Secondary | ICD-10-CM | POA: Diagnosis not present

## 2017-09-06 DIAGNOSIS — I69354 Hemiplegia and hemiparesis following cerebral infarction affecting left non-dominant side: Secondary | ICD-10-CM | POA: Diagnosis not present

## 2017-09-06 DIAGNOSIS — F17210 Nicotine dependence, cigarettes, uncomplicated: Secondary | ICD-10-CM | POA: Diagnosis not present

## 2017-09-06 DIAGNOSIS — I10 Essential (primary) hypertension: Secondary | ICD-10-CM | POA: Diagnosis not present

## 2017-09-06 NOTE — Telephone Encounter (Signed)
° ° ° °  Marlowe Kays with [facility] call to ask if pt is to have wound care to her left toe. Marlowe Kays stated pt sai there should not be anything done ot but Marlowe Kays said looks like something should be done to it.    [phone]

## 2017-09-07 NOTE — Telephone Encounter (Signed)
Spoke with Tara Hopkins and informed her to proceed with orders for wound care.  No further action needed.  Will close note.

## 2017-09-07 NOTE — Telephone Encounter (Signed)
Verbal orders for wound care are ok

## 2017-09-09 DIAGNOSIS — F17210 Nicotine dependence, cigarettes, uncomplicated: Secondary | ICD-10-CM | POA: Diagnosis not present

## 2017-09-09 DIAGNOSIS — M6281 Muscle weakness (generalized): Secondary | ICD-10-CM | POA: Diagnosis not present

## 2017-09-09 DIAGNOSIS — E1143 Type 2 diabetes mellitus with diabetic autonomic (poly)neuropathy: Secondary | ICD-10-CM | POA: Diagnosis not present

## 2017-09-09 DIAGNOSIS — D649 Anemia, unspecified: Secondary | ICD-10-CM | POA: Diagnosis not present

## 2017-09-09 DIAGNOSIS — I10 Essential (primary) hypertension: Secondary | ICD-10-CM | POA: Diagnosis not present

## 2017-09-09 DIAGNOSIS — I69354 Hemiplegia and hemiparesis following cerebral infarction affecting left non-dominant side: Secondary | ICD-10-CM | POA: Diagnosis not present

## 2017-09-13 DIAGNOSIS — D649 Anemia, unspecified: Secondary | ICD-10-CM | POA: Diagnosis not present

## 2017-09-13 DIAGNOSIS — I10 Essential (primary) hypertension: Secondary | ICD-10-CM | POA: Diagnosis not present

## 2017-09-13 DIAGNOSIS — M6281 Muscle weakness (generalized): Secondary | ICD-10-CM | POA: Diagnosis not present

## 2017-09-13 DIAGNOSIS — I69354 Hemiplegia and hemiparesis following cerebral infarction affecting left non-dominant side: Secondary | ICD-10-CM | POA: Diagnosis not present

## 2017-09-13 DIAGNOSIS — E1143 Type 2 diabetes mellitus with diabetic autonomic (poly)neuropathy: Secondary | ICD-10-CM | POA: Diagnosis not present

## 2017-09-13 DIAGNOSIS — F17210 Nicotine dependence, cigarettes, uncomplicated: Secondary | ICD-10-CM | POA: Diagnosis not present

## 2017-09-14 ENCOUNTER — Telehealth: Payer: Self-pay | Admitting: Adult Health

## 2017-09-14 DIAGNOSIS — M6281 Muscle weakness (generalized): Secondary | ICD-10-CM | POA: Diagnosis not present

## 2017-09-14 DIAGNOSIS — F17210 Nicotine dependence, cigarettes, uncomplicated: Secondary | ICD-10-CM | POA: Diagnosis not present

## 2017-09-14 DIAGNOSIS — D649 Anemia, unspecified: Secondary | ICD-10-CM | POA: Diagnosis not present

## 2017-09-14 DIAGNOSIS — I10 Essential (primary) hypertension: Secondary | ICD-10-CM | POA: Diagnosis not present

## 2017-09-14 DIAGNOSIS — I69354 Hemiplegia and hemiparesis following cerebral infarction affecting left non-dominant side: Secondary | ICD-10-CM | POA: Diagnosis not present

## 2017-09-14 DIAGNOSIS — E1143 Type 2 diabetes mellitus with diabetic autonomic (poly)neuropathy: Secondary | ICD-10-CM | POA: Diagnosis not present

## 2017-09-14 NOTE — Telephone Encounter (Signed)
Pt is calling to stating that [facility] need more information faxed [phone]) concerning her wheelchair.

## 2017-09-14 NOTE — Telephone Encounter (Signed)
Pt needs new rx for one touch meter. Pt has discount card. [facility] [address]

## 2017-09-15 DIAGNOSIS — E1143 Type 2 diabetes mellitus with diabetic autonomic (poly)neuropathy: Secondary | ICD-10-CM | POA: Diagnosis not present

## 2017-09-15 DIAGNOSIS — D649 Anemia, unspecified: Secondary | ICD-10-CM | POA: Diagnosis not present

## 2017-09-15 DIAGNOSIS — M6281 Muscle weakness (generalized): Secondary | ICD-10-CM | POA: Diagnosis not present

## 2017-09-15 DIAGNOSIS — F17210 Nicotine dependence, cigarettes, uncomplicated: Secondary | ICD-10-CM | POA: Diagnosis not present

## 2017-09-15 DIAGNOSIS — I10 Essential (primary) hypertension: Secondary | ICD-10-CM | POA: Diagnosis not present

## 2017-09-15 DIAGNOSIS — I69354 Hemiplegia and hemiparesis following cerebral infarction affecting left non-dominant side: Secondary | ICD-10-CM | POA: Diagnosis not present

## 2017-09-16 DIAGNOSIS — D649 Anemia, unspecified: Secondary | ICD-10-CM | POA: Diagnosis not present

## 2017-09-16 DIAGNOSIS — F17210 Nicotine dependence, cigarettes, uncomplicated: Secondary | ICD-10-CM | POA: Diagnosis not present

## 2017-09-16 DIAGNOSIS — M6281 Muscle weakness (generalized): Secondary | ICD-10-CM | POA: Diagnosis not present

## 2017-09-16 DIAGNOSIS — I10 Essential (primary) hypertension: Secondary | ICD-10-CM | POA: Diagnosis not present

## 2017-09-16 DIAGNOSIS — I69354 Hemiplegia and hemiparesis following cerebral infarction affecting left non-dominant side: Secondary | ICD-10-CM | POA: Diagnosis not present

## 2017-09-16 DIAGNOSIS — E1143 Type 2 diabetes mellitus with diabetic autonomic (poly)neuropathy: Secondary | ICD-10-CM | POA: Diagnosis not present

## 2017-09-20 DIAGNOSIS — F17210 Nicotine dependence, cigarettes, uncomplicated: Secondary | ICD-10-CM | POA: Diagnosis not present

## 2017-09-20 DIAGNOSIS — M6281 Muscle weakness (generalized): Secondary | ICD-10-CM | POA: Diagnosis not present

## 2017-09-20 DIAGNOSIS — E1143 Type 2 diabetes mellitus with diabetic autonomic (poly)neuropathy: Secondary | ICD-10-CM | POA: Diagnosis not present

## 2017-09-20 DIAGNOSIS — I10 Essential (primary) hypertension: Secondary | ICD-10-CM | POA: Diagnosis not present

## 2017-09-20 DIAGNOSIS — D649 Anemia, unspecified: Secondary | ICD-10-CM | POA: Diagnosis not present

## 2017-09-20 DIAGNOSIS — I69354 Hemiplegia and hemiparesis following cerebral infarction affecting left non-dominant side: Secondary | ICD-10-CM | POA: Diagnosis not present

## 2017-09-21 ENCOUNTER — Ambulatory Visit: Payer: Self-pay | Admitting: Adult Health

## 2017-09-21 MED ORDER — GLUCOSE BLOOD VI STRP: ORAL_STRIP | 12 refills | Status: DC

## 2017-09-21 MED ORDER — ONETOUCH VERIO W/DEVICE KIT: 1.0000 | PACK | Freq: Once | 0 refills | Status: AC

## 2017-09-21 NOTE — Telephone Encounter (Signed)
Sent to the pharmacy by e-scribe. 

## 2017-09-21 NOTE — Telephone Encounter (Signed)
The name of dm meter is one touch verio. [facility] [address]

## 2017-09-22 DIAGNOSIS — I10 Essential (primary) hypertension: Secondary | ICD-10-CM | POA: Diagnosis not present

## 2017-09-22 DIAGNOSIS — I69354 Hemiplegia and hemiparesis following cerebral infarction affecting left non-dominant side: Secondary | ICD-10-CM | POA: Diagnosis not present

## 2017-09-22 DIAGNOSIS — E1143 Type 2 diabetes mellitus with diabetic autonomic (poly)neuropathy: Secondary | ICD-10-CM | POA: Diagnosis not present

## 2017-09-22 DIAGNOSIS — M6281 Muscle weakness (generalized): Secondary | ICD-10-CM | POA: Diagnosis not present

## 2017-09-22 DIAGNOSIS — D649 Anemia, unspecified: Secondary | ICD-10-CM | POA: Diagnosis not present

## 2017-09-22 DIAGNOSIS — F17210 Nicotine dependence, cigarettes, uncomplicated: Secondary | ICD-10-CM | POA: Diagnosis not present

## 2017-09-23 DIAGNOSIS — M6281 Muscle weakness (generalized): Secondary | ICD-10-CM | POA: Diagnosis not present

## 2017-09-23 DIAGNOSIS — F17210 Nicotine dependence, cigarettes, uncomplicated: Secondary | ICD-10-CM | POA: Diagnosis not present

## 2017-09-23 DIAGNOSIS — E1143 Type 2 diabetes mellitus with diabetic autonomic (poly)neuropathy: Secondary | ICD-10-CM | POA: Diagnosis not present

## 2017-09-23 DIAGNOSIS — I10 Essential (primary) hypertension: Secondary | ICD-10-CM | POA: Diagnosis not present

## 2017-09-23 DIAGNOSIS — I69354 Hemiplegia and hemiparesis following cerebral infarction affecting left non-dominant side: Secondary | ICD-10-CM | POA: Diagnosis not present

## 2017-09-23 DIAGNOSIS — D649 Anemia, unspecified: Secondary | ICD-10-CM | POA: Diagnosis not present

## 2017-09-23 MED ORDER — ONETOUCH LANCETS MISC: 3 refills | Status: AC

## 2017-09-23 NOTE — Telephone Encounter (Signed)
Misty patient also needs lancets. [facility] on [address]

## 2017-09-23 NOTE — Addendum Note (Signed)
Addended by: Miles Costain T on: 09/23/2017 11:03 AM   Modules accepted: Orders

## 2017-09-23 NOTE — Telephone Encounter (Signed)
Sent to the pharmacy by e-scribe. 

## 2017-09-24 ENCOUNTER — Other Ambulatory Visit: Payer: Self-pay | Admitting: Adult Health

## 2017-09-24 DIAGNOSIS — I10 Essential (primary) hypertension: Secondary | ICD-10-CM

## 2017-09-24 NOTE — Telephone Encounter (Signed)
Last prescribed 11/12/15.  Is pt still taking?  Please advise.

## 2017-09-24 NOTE — Telephone Encounter (Signed)
She should be....  Go ahead and refill please

## 2017-09-27 DIAGNOSIS — D649 Anemia, unspecified: Secondary | ICD-10-CM | POA: Diagnosis not present

## 2017-09-27 DIAGNOSIS — I10 Essential (primary) hypertension: Secondary | ICD-10-CM | POA: Diagnosis not present

## 2017-09-27 DIAGNOSIS — M6281 Muscle weakness (generalized): Secondary | ICD-10-CM | POA: Diagnosis not present

## 2017-09-27 DIAGNOSIS — F17210 Nicotine dependence, cigarettes, uncomplicated: Secondary | ICD-10-CM | POA: Diagnosis not present

## 2017-09-27 DIAGNOSIS — I69354 Hemiplegia and hemiparesis following cerebral infarction affecting left non-dominant side: Secondary | ICD-10-CM | POA: Diagnosis not present

## 2017-09-27 DIAGNOSIS — E1143 Type 2 diabetes mellitus with diabetic autonomic (poly)neuropathy: Secondary | ICD-10-CM | POA: Diagnosis not present

## 2017-09-27 NOTE — Telephone Encounter (Signed)
Sent to the pharmacy by e-scribe. 

## 2017-09-28 ENCOUNTER — Encounter (HOSPITAL_BASED_OUTPATIENT_CLINIC_OR_DEPARTMENT_OTHER): Payer: Medicare Other | Attending: Surgery

## 2017-09-28 DIAGNOSIS — Z79899 Other long term (current) drug therapy: Secondary | ICD-10-CM | POA: Insufficient documentation

## 2017-09-28 DIAGNOSIS — I739 Peripheral vascular disease, unspecified: Secondary | ICD-10-CM | POA: Diagnosis not present

## 2017-09-28 DIAGNOSIS — E119 Type 2 diabetes mellitus without complications: Secondary | ICD-10-CM | POA: Diagnosis not present

## 2017-09-28 DIAGNOSIS — E1151 Type 2 diabetes mellitus with diabetic peripheral angiopathy without gangrene: Secondary | ICD-10-CM | POA: Insufficient documentation

## 2017-09-28 DIAGNOSIS — F17210 Nicotine dependence, cigarettes, uncomplicated: Secondary | ICD-10-CM | POA: Insufficient documentation

## 2017-09-28 DIAGNOSIS — E1140 Type 2 diabetes mellitus with diabetic neuropathy, unspecified: Secondary | ICD-10-CM | POA: Insufficient documentation

## 2017-09-28 DIAGNOSIS — F17218 Nicotine dependence, cigarettes, with other nicotine-induced disorders: Secondary | ICD-10-CM | POA: Diagnosis not present

## 2017-09-28 DIAGNOSIS — Z872 Personal history of diseases of the skin and subcutaneous tissue: Secondary | ICD-10-CM | POA: Diagnosis not present

## 2017-09-28 DIAGNOSIS — Z8631 Personal history of diabetic foot ulcer: Secondary | ICD-10-CM | POA: Insufficient documentation

## 2017-09-28 DIAGNOSIS — L84 Corns and callosities: Secondary | ICD-10-CM | POA: Diagnosis not present

## 2017-09-28 DIAGNOSIS — Z89431 Acquired absence of right foot: Secondary | ICD-10-CM | POA: Insufficient documentation

## 2017-09-28 DIAGNOSIS — Z8673 Personal history of transient ischemic attack (TIA), and cerebral infarction without residual deficits: Secondary | ICD-10-CM | POA: Diagnosis not present

## 2017-09-28 DIAGNOSIS — Z7902 Long term (current) use of antithrombotics/antiplatelets: Secondary | ICD-10-CM | POA: Diagnosis not present

## 2017-09-28 DIAGNOSIS — S91102A Unspecified open wound of left great toe without damage to nail, initial encounter: Secondary | ICD-10-CM | POA: Diagnosis not present

## 2017-09-28 NOTE — Telephone Encounter (Signed)
Called to the pharmacy and left on machine. 

## 2017-09-28 NOTE — Telephone Encounter (Signed)
Ok to refill for 30 days  

## 2017-10-01 DIAGNOSIS — D649 Anemia, unspecified: Secondary | ICD-10-CM | POA: Diagnosis not present

## 2017-10-01 DIAGNOSIS — I10 Essential (primary) hypertension: Secondary | ICD-10-CM | POA: Diagnosis not present

## 2017-10-01 DIAGNOSIS — M6281 Muscle weakness (generalized): Secondary | ICD-10-CM | POA: Diagnosis not present

## 2017-10-01 DIAGNOSIS — I69354 Hemiplegia and hemiparesis following cerebral infarction affecting left non-dominant side: Secondary | ICD-10-CM | POA: Diagnosis not present

## 2017-10-01 DIAGNOSIS — F17210 Nicotine dependence, cigarettes, uncomplicated: Secondary | ICD-10-CM | POA: Diagnosis not present

## 2017-10-01 DIAGNOSIS — E1143 Type 2 diabetes mellitus with diabetic autonomic (poly)neuropathy: Secondary | ICD-10-CM | POA: Diagnosis not present

## 2017-10-05 ENCOUNTER — Encounter: Payer: Self-pay | Admitting: Neurology

## 2017-10-05 ENCOUNTER — Ambulatory Visit (INDEPENDENT_AMBULATORY_CARE_PROVIDER_SITE_OTHER): Payer: Medicare Other | Admitting: Neurology

## 2017-10-05 VITALS — BP 172/75 | HR 76 | Ht 65.0 in | Wt 188.0 lb

## 2017-10-05 DIAGNOSIS — I10 Essential (primary) hypertension: Secondary | ICD-10-CM | POA: Diagnosis not present

## 2017-10-05 DIAGNOSIS — F1910 Other psychoactive substance abuse, uncomplicated: Secondary | ICD-10-CM | POA: Diagnosis not present

## 2017-10-05 DIAGNOSIS — E118 Type 2 diabetes mellitus with unspecified complications: Secondary | ICD-10-CM | POA: Diagnosis not present

## 2017-10-05 DIAGNOSIS — Z87891 Personal history of nicotine dependence: Secondary | ICD-10-CM

## 2017-10-05 DIAGNOSIS — Z8673 Personal history of transient ischemic attack (TIA), and cerebral infarction without residual deficits: Secondary | ICD-10-CM | POA: Diagnosis not present

## 2017-10-05 DIAGNOSIS — I63311 Cerebral infarction due to thrombosis of right middle cerebral artery: Secondary | ICD-10-CM

## 2017-10-05 DIAGNOSIS — I63331 Cerebral infarction due to thrombosis of right posterior cerebral artery: Secondary | ICD-10-CM | POA: Diagnosis not present

## 2017-10-05 MED ORDER — ASPIRIN 325 MG PO TBEC: 325.0000 mg | DELAYED_RELEASE_TABLET | Freq: Every day | ORAL | 0 refills | Status: DC

## 2017-10-05 NOTE — Progress Notes (Signed)
STROKE NEUROLOGY FOLLOW UP NOTE  NAME: Tara Hopkins DOB: [birth year]  REASON FOR VISIT: stroke follow up HISTORY FROM: pt and chart  Today we had the pleasure of seeing Tara Hopkins in follow-up at our Neurology Clinic. Pt was accompanied by no one.   History Summary Tara Hopkins is a 66 y.o. female with history of PMH of DM, hypertension, hyperlipidemia, substance abuse, amputation left foot and bilateral lacunar infarct in 2016 admitted on 07/20/17 for slurred speech and left-sided weakness.  MRI showed right PLIC acute infarct.  MRA head neck showed right M1 and left P2 stenosis, 60% left subclavian stenosis, bilateral VA origin stenosis with flow gaps, and left ICA 40% stenosis.  EF 55-60%.  UDS positive for THC.  Patient admitted taking cocaine 4 weeks prior.  LDL 88 and A1c 9.2.  Patient stroke most consistent with small vessel disease due to uncontrolled risk factors.  Aspirin 325 continued, but change Zocor to Lipitor 40 mg.  Patient discharged to CIR.  Interval History During the interval time, the patient has been doing better.  Tara Hopkins signed AMA during CIR.  Had home PT/OT afterwards, currently finished.  Dysarthria and left-sided weakness much improved, now near baseline.  Her blood pressure at home is still high, and today in clinic 172/75.  However, her glucose was low this morning at 64 and Tara Hopkins is on Lantus.  Tara Hopkins has appointment with PCP tomorrow and Tara Hopkins is going to discuss with PCP about BP and DM control.  Stated that Tara Hopkins has quit smoking 2 weeks ago, and no more cocaine or THC after discharge. Came in with walker today but Tara Hopkins said Tara Hopkins was on wheelchair before and just recently use walker.   REVIEW OF SYSTEMS: Full 14 system review of systems performed and notable only for those listed below and in HPI above, all others are negative:  Constitutional:   Cardiovascular:  Ear/Nose/Throat:   Skin:  Eyes:   Respiratory:   Gastroitestinal:   Genitourinary:    Hematology/Lymphatic:   Endocrine:  Musculoskeletal:   Allergy/Immunology:   Neurological:   Psychiatric:  Sleep:   The following represents the patient's updated allergies and side effects list: No Known Allergies  The neurologically relevant items on the patient's problem list were reviewed on today's visit.  Neurologic Examination  A problem focused neurological exam (12 or more points of the single system neurologic examination, vital signs counts as 1 point, cranial nerves count for 8 points) was performed.  Blood pressure (!) 172/75, pulse 76, height 5\' 5"  (1.651 m), weight 188 lb (85.3 kg).  General - Well nourished, well developed, in no apparent distress.  Ophthalmologic - Fundi not visualized due to noncooperation.  Cardiovascular - Regular rate and rhythm with no murmur.  Mental Status -  Level of arousal and orientation to time, place, and person were intact. Language including expression, naming, repetition, comprehension was assessed and found intact.  Cranial Nerves II - XII - II - Visual field intact OU. III, IV, VI - Extraocular movements intact. V - Facial sensation intact bilaterally. VII - Facial movement intact bilaterally. VIII - Hearing & vestibular intact bilaterally. X - Palate elevates symmetrically. XI - Chin turning & shoulder shrug intact bilaterally. XII - Tongue protrusion intact.  Motor Strength - The patient's strength was 4+/5 in all extremities and pronator drift was absent.  Bulk was normal and fasciculations were absent.   Motor Tone - Muscle tone was assessed at the neck and appendages and was normal.  Reflexes - The patient's reflexes were 1+ in all extremities and Tara Hopkins had no pathological reflexes.  Sensory - Light touch, temperature/pinprick were assessed and were normal.    Coordination - The patient had normal movements in the hands with no ataxia or dysmetria.  Tremor was absent.  Gait and Station - walk with walker and  shuffling gait, difficulty getting out of chair   Functional score  mRS = 3   0 - No symptoms.   1 - No significant disability. Able to carry out all usual activities, despite some symptoms.   2 - Slight disability. Able to look after own affairs without assistance, but unable to carry out all previous activities.   3 - Moderate disability. Requires some help, but able to walk unassisted.   4 - Moderately severe disability. Unable to attend to own bodily needs without assistance, and unable to walk unassisted.   5 - Severe disability. Requires constant nursing care and attention, bedridden, incontinent.   6 - Dead.   NIH Stroke Scale = 0   Data reviewed: I personally reviewed the images and agree with the radiology interpretations.  Ct Head Wo Contrast 07/20/2017  IMPRESSION: 1. No acute intracranial abnormality. No evidence of acute ischemia on noncontrast head CT. 2. Stable atrophy, chronic small vessel ischemia and remote lacunar infarct.  Mr [facility] Contrast Mr Jodene Nam Neck W Wo Contrast 07/21/2017 IMPRESSION: Intracranial MRA: 1. No acute finding. 2. Intracranial atherosclerosis most significantly affecting the left PCA where there are high-grade P2 branch stenoses. 3. Moderate right proximal M1 segment stenosis. Neck MRA: 1. ~40% left ICA origin stenosis. 2. At least 60% proximal left subclavian stenosis. 3. Bilateral high-grade vertebral origins stenoses with flow gaps. 4. Goiter.  Mr Brain Wo Contrast 07/21/2017 IMPRESSION: 1. Subcentimeter acute/early subacute infarction in right posterior limb of internal capsule. No acute hemorrhage. 2. Moderate chronic microvascular ischemic changes and moderate parenchymal volume loss of the brain. Chronic lacunar infarcts of basal ganglia and pons.    TTE 07/21/2017  Study Conclusions - Left ventricle: The cavity size was normal. There was moderatefocal basal hypertrophy of the septum. Systolic function wasnormal. The  estimated ejection fraction was in the range of 55%to 60%. Wall motion was normal; there were no regional wallmotion abnormalities. Features are consistent with a pseudonormalleft ventricular filling pattern, with concomitant abnormalrelaxation and increased filling pressure (grade 2 diastolicdysfunction). - Mitral valve: There was mild regurgitation. - Tricuspid valve: There was mild regurgitation. - Pulmonary arteries: Systolic pressure was mildly increased. PApeak pressure: 34 mm Hg (S). Impressions: - No cardiac source of emboli was indentified. Compared to the prior study, there has been no significant interval change.  Component     Latest Ref Rng & Units 07/21/2017 07/22/2017  Cholesterol     0 - 200 mg/dL  166  Triglycerides     <150 mg/dL  191 (H)  HDL Cholesterol     >40 mg/dL  41  Total CHOL/HDL Ratio     RATIO  4.0  VLDL     0 - 40 mg/dL  38  LDL (calc)     0 - 99 mg/dL  87  Hemoglobin A1C     4.8 - 5.6 % 9.2 (H)   Mean Plasma Glucose     mg/dL 217.34   HIV Screen 4th Generation wRfx     Non Reactive Non Reactive     Assessment: As you may recall, Tara Hopkins is a 66 y.o. African American female with PMH  of DM, hypertension, hyperlipidemia, substance abuse, amputation left foot and bilateral lacunar infarct in 2016 admitted on 5/40/08 for right PLIC acute infarct.  MRA head neck showed right M1 and left P2 stenosis, 60% left subclavian stenosis, bilateral VA origin stenosis with flow gaps, and left ICA 40% stenosis.  EF 55-60%.  UDS positive for THC.  Patient admitted taking cocaine 4 weeks prior.  LDL 88 and A1c 9.2.  Patient stroke most consistent with small vessel disease due to uncontrolled risk factors.  Aspirin 325 continued, but change Zocor to Lipitor 40 mg.  During the interval time, dysarthria and left-sided weakness much improved, now near baseline. BP and DM not in good control yet. Has PCP follow up tomorrow. Stated that Tara Hopkins has quit smoking 2 weeks ago, and no  more cocaine or THC after discharge. Now walk with walker.  Plan:  - continue ASA 325mg  and lipitor for stroke prevention - Follow up with your primary care physician for stroke risk factor modification. Recommend maintain blood pressure goal <130/80, diabetes with hemoglobin A1c goal below 7.0% and lipids with LDL cholesterol goal below 70 mg/dL.  - check BP and glucose at home and record - diabetic diet and self exercise - continue abstain from smoking, cocaine or THC - use walker more to improve walking  - follow up in 4 months.   I spent more than 25 minutes of face to face time with the patient. Greater than 50% of time was spent in counseling and coordination of care.    No orders of the defined types were placed in this encounter.   Meds ordered this encounter  Medications  . aspirin 325 MG EC tablet    Sig: Take 1 tablet (325 mg total) by mouth daily.    Dispense:  30 tablet    Refill:  0    Patient Instructions  - continue ASA 325mg  and lipitor for stroke prevention - Follow up with your primary care physician for stroke risk factor modification. Recommend maintain blood pressure goal <130/80, diabetes with hemoglobin A1c goal below 7.0% and lipids with LDL cholesterol goal below 70 mg/dL.  - check BP and glucose at home and record - diabetic diet and self exercise - continue abstain from smoking, cocaine or THC - use walker more to improve walking  - follow up in 4 months.    Rosalin Hawking, MD PhD [facility] [address] [place], [place] [address] [phone]

## 2017-10-05 NOTE — Patient Instructions (Addendum)
-   continue ASA 325mg  and lipitor for stroke prevention - Follow up with your primary care physician for stroke risk factor modification. Recommend maintain blood pressure goal <130/80, diabetes with hemoglobin A1c goal below 7.0% and lipids with LDL cholesterol goal below 70 mg/dL.  - check BP and glucose at home and record - diabetic diet and self exercise - continue abstain from smoking, cocaine or THC - use walker more to improve walking  - follow up in 4 months.

## 2017-10-06 ENCOUNTER — Ambulatory Visit: Payer: Self-pay | Admitting: Adult Health

## 2017-10-07 ENCOUNTER — Telehealth: Payer: Self-pay | Admitting: *Deleted

## 2017-10-07 DIAGNOSIS — F17210 Nicotine dependence, cigarettes, uncomplicated: Secondary | ICD-10-CM | POA: Diagnosis not present

## 2017-10-07 DIAGNOSIS — D649 Anemia, unspecified: Secondary | ICD-10-CM | POA: Diagnosis not present

## 2017-10-07 DIAGNOSIS — M6281 Muscle weakness (generalized): Secondary | ICD-10-CM | POA: Diagnosis not present

## 2017-10-07 DIAGNOSIS — I69354 Hemiplegia and hemiparesis following cerebral infarction affecting left non-dominant side: Secondary | ICD-10-CM | POA: Diagnosis not present

## 2017-10-07 DIAGNOSIS — E1143 Type 2 diabetes mellitus with diabetic autonomic (poly)neuropathy: Secondary | ICD-10-CM | POA: Diagnosis not present

## 2017-10-07 DIAGNOSIS — I10 Essential (primary) hypertension: Secondary | ICD-10-CM | POA: Diagnosis not present

## 2017-10-07 NOTE — Telephone Encounter (Signed)
Copied from [place] [phone]. Topic: General - Other >> Oct 07, 2017 12:53 PM Synthia Innocent wrote: Reason for CRM: Need verbal orders to DM teaching and monitor newly closed wound

## 2017-10-08 NOTE — Telephone Encounter (Signed)
Called and spoke to [facility].  Informed her to proceed with orders.

## 2017-10-08 NOTE — Telephone Encounter (Signed)
Ok for verbal orders ?

## 2017-10-11 DIAGNOSIS — D649 Anemia, unspecified: Secondary | ICD-10-CM | POA: Diagnosis not present

## 2017-10-11 DIAGNOSIS — S90412D Abrasion, left great toe, subsequent encounter: Secondary | ICD-10-CM | POA: Diagnosis not present

## 2017-10-11 DIAGNOSIS — E1143 Type 2 diabetes mellitus with diabetic autonomic (poly)neuropathy: Secondary | ICD-10-CM | POA: Diagnosis not present

## 2017-10-11 DIAGNOSIS — F319 Bipolar disorder, unspecified: Secondary | ICD-10-CM | POA: Diagnosis not present

## 2017-10-11 DIAGNOSIS — I69354 Hemiplegia and hemiparesis following cerebral infarction affecting left non-dominant side: Secondary | ICD-10-CM | POA: Diagnosis not present

## 2017-10-11 DIAGNOSIS — I10 Essential (primary) hypertension: Secondary | ICD-10-CM | POA: Diagnosis not present

## 2017-10-12 ENCOUNTER — Encounter (HOSPITAL_BASED_OUTPATIENT_CLINIC_OR_DEPARTMENT_OTHER): Payer: Medicare Other | Attending: Surgery

## 2017-10-12 DIAGNOSIS — I10 Essential (primary) hypertension: Secondary | ICD-10-CM | POA: Diagnosis not present

## 2017-10-12 DIAGNOSIS — D649 Anemia, unspecified: Secondary | ICD-10-CM | POA: Diagnosis not present

## 2017-10-12 DIAGNOSIS — S90412D Abrasion, left great toe, subsequent encounter: Secondary | ICD-10-CM | POA: Diagnosis not present

## 2017-10-12 DIAGNOSIS — E1143 Type 2 diabetes mellitus with diabetic autonomic (poly)neuropathy: Secondary | ICD-10-CM | POA: Diagnosis not present

## 2017-10-12 DIAGNOSIS — F319 Bipolar disorder, unspecified: Secondary | ICD-10-CM | POA: Diagnosis not present

## 2017-10-12 DIAGNOSIS — I69354 Hemiplegia and hemiparesis following cerebral infarction affecting left non-dominant side: Secondary | ICD-10-CM | POA: Diagnosis not present

## 2017-10-14 DIAGNOSIS — I10 Essential (primary) hypertension: Secondary | ICD-10-CM | POA: Diagnosis not present

## 2017-10-14 DIAGNOSIS — D649 Anemia, unspecified: Secondary | ICD-10-CM | POA: Diagnosis not present

## 2017-10-14 DIAGNOSIS — E1143 Type 2 diabetes mellitus with diabetic autonomic (poly)neuropathy: Secondary | ICD-10-CM | POA: Diagnosis not present

## 2017-10-14 DIAGNOSIS — S90412D Abrasion, left great toe, subsequent encounter: Secondary | ICD-10-CM | POA: Diagnosis not present

## 2017-10-14 DIAGNOSIS — F319 Bipolar disorder, unspecified: Secondary | ICD-10-CM | POA: Diagnosis not present

## 2017-10-14 DIAGNOSIS — I69354 Hemiplegia and hemiparesis following cerebral infarction affecting left non-dominant side: Secondary | ICD-10-CM | POA: Diagnosis not present

## 2017-10-21 ENCOUNTER — Telehealth: Payer: Self-pay | Admitting: Adult Health

## 2017-10-21 DIAGNOSIS — F319 Bipolar disorder, unspecified: Secondary | ICD-10-CM | POA: Diagnosis not present

## 2017-10-21 DIAGNOSIS — S90412D Abrasion, left great toe, subsequent encounter: Secondary | ICD-10-CM | POA: Diagnosis not present

## 2017-10-21 DIAGNOSIS — D649 Anemia, unspecified: Secondary | ICD-10-CM | POA: Diagnosis not present

## 2017-10-21 DIAGNOSIS — I69354 Hemiplegia and hemiparesis following cerebral infarction affecting left non-dominant side: Secondary | ICD-10-CM | POA: Diagnosis not present

## 2017-10-21 DIAGNOSIS — E1143 Type 2 diabetes mellitus with diabetic autonomic (poly)neuropathy: Secondary | ICD-10-CM | POA: Diagnosis not present

## 2017-10-21 DIAGNOSIS — I10 Essential (primary) hypertension: Secondary | ICD-10-CM | POA: Diagnosis not present

## 2017-10-21 NOTE — Telephone Encounter (Signed)
Ok to order 

## 2017-10-21 NOTE — Telephone Encounter (Signed)
Copied from [place] [id number]. Topic: Quick Communication - See Telephone Encounter >> Oct 21, 2017 10:14 AM Cleaster Corin, NT wrote: CRM for notification. See Telephone encounter for:   10/21/17. Pt. Calling and stating that [facility] has not received order for pt. To get a wheelchair.

## 2017-10-25 ENCOUNTER — Telehealth: Payer: Self-pay | Admitting: Family Medicine

## 2017-10-25 DIAGNOSIS — I69354 Hemiplegia and hemiparesis following cerebral infarction affecting left non-dominant side: Secondary | ICD-10-CM | POA: Diagnosis not present

## 2017-10-25 DIAGNOSIS — E1143 Type 2 diabetes mellitus with diabetic autonomic (poly)neuropathy: Secondary | ICD-10-CM | POA: Diagnosis not present

## 2017-10-25 DIAGNOSIS — I10 Essential (primary) hypertension: Secondary | ICD-10-CM | POA: Diagnosis not present

## 2017-10-25 DIAGNOSIS — S90412D Abrasion, left great toe, subsequent encounter: Secondary | ICD-10-CM | POA: Diagnosis not present

## 2017-10-25 DIAGNOSIS — F319 Bipolar disorder, unspecified: Secondary | ICD-10-CM | POA: Diagnosis not present

## 2017-10-25 DIAGNOSIS — D649 Anemia, unspecified: Secondary | ICD-10-CM | POA: Diagnosis not present

## 2017-10-25 NOTE — Telephone Encounter (Signed)
Order faxed to [facility].  Received confirmation that the fax was successful.

## 2017-10-25 NOTE — Telephone Encounter (Signed)
Copied from [place] [phone]. Topic: Referral - Question >> Oct 25, 2017 12:57 PM Pricilla Handler wrote: Reason for CRM: Patient wants [facility] to give her a call. Patient stated that she needs a referral for her right hand. It is very painful and unbearable. Patient also wants to check the status of her wheelchair.       Thank You!!!

## 2017-10-26 ENCOUNTER — Telehealth: Payer: Self-pay | Admitting: Family Medicine

## 2017-10-26 NOTE — Telephone Encounter (Signed)
Pt requesting electric wheelchair and not manual.  Informed pt that there has to be documentation in an ov note in order to qualify for electric wheelchair.  Pt will also discuss referral to hand specialist next week when she comes for follow up on 11/03/17

## 2017-10-26 NOTE — Telephone Encounter (Signed)
Copied from [place] [id number]. Topic: General - Other >> Oct 26, 2017  1:30 PM Marin Olp L wrote: Reason for CRM: Patient calling b/c the PA Nafz[place] ordered a manual wheelchair but patient needs a motor wheelchair b/c she has carpel tunnel. Please f/u with patient.

## 2017-10-26 NOTE — Telephone Encounter (Signed)
Explained to pt that in order to qualify for electric wheelchair there has to be documentation in an office visit.  Pt has an appt scheduled on 11/03/17 and will discuss at that time.

## 2017-11-03 ENCOUNTER — Ambulatory Visit: Payer: Medicare Other | Admitting: Adult Health

## 2017-11-04 ENCOUNTER — Other Ambulatory Visit: Payer: Self-pay | Admitting: Adult Health

## 2017-11-04 ENCOUNTER — Other Ambulatory Visit: Payer: Self-pay | Admitting: Family Medicine

## 2017-11-04 MED ORDER — CLONAZEPAM 0.5 MG PO TABS: 0.5000 mg | ORAL_TABLET | Freq: Three times a day (TID) | ORAL | 0 refills | Status: DC

## 2017-11-04 NOTE — Telephone Encounter (Signed)
Ok to refill for 6 months

## 2017-11-04 NOTE — Telephone Encounter (Signed)
Please find out if she is taking lipitor or Zocor..both are on medication list

## 2017-11-04 NOTE — Telephone Encounter (Signed)
Medication sent to pharmacy  

## 2017-11-04 NOTE — Telephone Encounter (Signed)
Tara Hopkins patient

## 2017-11-05 ENCOUNTER — Ambulatory Visit: Payer: Medicare Other | Admitting: Adult Health

## 2017-11-05 NOTE — Telephone Encounter (Signed)
Tara Hopkins, See discharge note from 07/23/17.  Simvastatin discontinued.  Looks like pt should be taking atorvastatin 40 mg.  Please advise.

## 2017-11-05 NOTE — Telephone Encounter (Signed)
Medication request denied.  Pt is no longer taking this medication.  Message sent to the pharmacy to discontinue.

## 2017-11-05 NOTE — Telephone Encounter (Signed)
DUPLICATE REQUEST.  THIS WAS SENT IN ELECTRONICALLY BY [facility] ON 121/27/18

## 2017-11-05 NOTE — Telephone Encounter (Signed)
D/c simvastatin

## 2017-11-11 ENCOUNTER — Telehealth: Payer: Self-pay | Admitting: Family Medicine

## 2017-11-11 MED ORDER — INSULIN GLARGINE 100 UNITS/ML SOLOSTAR PEN: 25.0000 [IU] | PEN_INJECTOR | Freq: Every day | SUBCUTANEOUS | 0 refills | Status: DC

## 2017-11-11 NOTE — Telephone Encounter (Signed)
Rx re sent to the pharamcy by fax.  Received confirmation that the transaction was complete.

## 2017-11-11 NOTE — Telephone Encounter (Signed)
Copied from [place]. Topic: Quick Communication - See Telephone Encounter >> Nov 11, 2017  3:29 PM Percell Belt A wrote: Pt called back in, I explained to her that the lantus was called in to the pharmacy.  She is going to call the pharmacy and see how much it is.  She said that last year the Lantas was $300.0 but she will check.

## 2017-11-11 NOTE — Telephone Encounter (Signed)
Noted  

## 2017-11-11 NOTE — Telephone Encounter (Signed)
Patient states that the pharmacy has not received refill, please advise

## 2017-11-11 NOTE — Telephone Encounter (Signed)
Pt called back to inform that indeed the LANTIS is too expensive, around $129, she is still looking for an alternative for her insulin, she said she will wait for a phone call to see what resolution Dr. Tommi Rumps comes to, contact the pt to advise

## 2017-11-11 NOTE — Telephone Encounter (Signed)
Copied from [place] [id number]. Topic: General - Other >> Nov 11, 2017  2:54 PM Tara Hopkins, RMA wrote: Reason for CRM:Pt called back and stated that she is almost out of her insuline and would like a call back

## 2017-11-11 NOTE — Telephone Encounter (Signed)
Copied from [place] [phone]. Topic: General - Other >> Nov 11, 2017 10:06 AM Yvette Rack wrote: Reason for CRM: patient calling wanting Dr Carlisle Cater assistant  to give her a call back about her insuline she couldn't remember the name of it she states that provider assistant knows which insulin that she's talking about

## 2017-11-11 NOTE — Telephone Encounter (Signed)
Called and spoke to the pharmacy.  Pt's copay is $128 for Basaglar.  Pt cannot afford this.  Per Tommi Rumps, ok to send in Lantus.  Medication sent in.  Pt needs to be notified.

## 2017-11-12 NOTE — Telephone Encounter (Signed)
Left a message for a return call.  CRM created. 

## 2017-11-12 NOTE — Telephone Encounter (Signed)
Please have her contact her insurance company and find out which insulins they cover. Also have her find out if they cover Trulicity

## 2017-11-24 ENCOUNTER — Encounter: Payer: Self-pay | Admitting: Adult Health

## 2017-11-24 ENCOUNTER — Ambulatory Visit (INDEPENDENT_AMBULATORY_CARE_PROVIDER_SITE_OTHER): Payer: Medicare Other | Admitting: Adult Health

## 2017-11-24 VITALS — BP 170/80 | Temp 98.5°F | Ht 65.0 in

## 2017-11-24 DIAGNOSIS — R2681 Unsteadiness on feet: Secondary | ICD-10-CM | POA: Diagnosis not present

## 2017-11-24 DIAGNOSIS — F329 Major depressive disorder, single episode, unspecified: Secondary | ICD-10-CM | POA: Diagnosis not present

## 2017-11-24 DIAGNOSIS — F32A Depression, unspecified: Secondary | ICD-10-CM

## 2017-11-24 MED ORDER — FLUOXETINE HCL 20 MG PO TABS: 20.0000 mg | ORAL_TABLET | Freq: Every day | ORAL | 3 refills | Status: DC

## 2017-11-24 NOTE — Progress Notes (Signed)
Subjective:    Patient ID: Tara Hopkins, female    DOB: December 26, 1950, 67 y.o.   MRN: [id number]  HPI 67 year old female who  has a past medical history of Anemia, Anxiety, Bipolar 1 disorder ([place]), Cataracts, bilateral, Depression, Diabetes mellitus without complication ([place]), Glaucoma, History of blood transfusion, History of hyperbaric oxygen therapy, Hypercholesteremia, Hypertension, Peripheral neuropathy, and Stroke ([place]) (02/2015).  He presents to the office today for multiple needs.  1.  Depression-she reports being severely depressed past her baseline.  Has been prescribed Celexa 10 mg in the past but has not taken it for an extended period of time.  Per patient, she reports that her depression stems from her most recent CVA in October 2018.  She is upset because she has not recovering like she did after her first CVA.  She continues to have issues with ambulation and caring for herself.  Her family(ex-husband and daughter)are no  longer staying in communication with her, and nobody is coming to visit her at her home.  She is no longer eligible for physical therapy or home health care.  She does report about 3 weeks ago she thought about killing herself but luckily decided it was not worth it.  Currently she denies any suicidal ideation.  Would like to go back on something for depression.  She expresses hope as her niece is going to take her to local assisted living facilities to see if they can get her in there.  2.  She is currently using self-propelled wheelchair.  She finds this difficult to maneuver and being contained to a low sitting wheelchair for most of the day she is unable to do or has great difficulty in doing household chores such as cooking dinner, washing dishes, or using her sink.  She would like to get a motorized wheelchair  3.  He needs a note to her apartment complex for them to change the doorknob on her front door.  This is so that she can hang a lock box for EMS to  access in case something happens to her where she is unable to get to the door to unlock it during our emergency.  She also would like in the note to state that the shoulder going into the front door should have a metal plate over it so it is easier for her to wheel her chair into her apartment.     Review of Systems See HPI   Past Medical History:  Diagnosis Date  . Anemia   . Anxiety   . Bipolar 1 disorder ([place])   . Cataracts, bilateral   . Depression   . Diabetes mellitus without complication ([place])   . Glaucoma   . History of blood transfusion   . History of hyperbaric oxygen therapy    pt reports 80 treatments.  . Hypercholesteremia   . Hypertension   . Peripheral neuropathy   . Stroke [facility]) 02/2015    Social History   Socioeconomic History  . Marital status: Married    Spouse name: Not on file  . Number of children: 3  . Years of education: 62  . Highest education level: Not on file  Social Needs  . Financial resource strain: Not on file  . Food insecurity - worry: Not on file  . Food insecurity - inability: Not on file  . Transportation needs - medical: Not on file  . Transportation needs - non-medical: Not on file  Occupational History  . Occupation: Disbaled  Tobacco Use  . Smoking status: Former Smoker    Packs/day: 0.01    Years: 20.00    Pack years: 0.20    Types: Cigarettes    Last attempt to quit: 09/26/2017    Years since quitting: 0.1  . Smokeless tobacco: Never Used  Substance and Sexual Activity  . Alcohol use: Yes    Alcohol/week: 0.0 oz    Comment: beer every 3 months  . Drug use: No  . Sexual activity: Not on file  Other Topics Concern  . Not on file  Social History Narrative   Lives at home alone.   Right-handed.   No caffeine use.   Oldest of 12 children     Past Surgical History:  Procedure Laterality Date  . AMPUTATION Right 12/15/2013   Procedure: Right Transmetatarsal Amputation;  Surgeon: Newt Minion, MD;  Location: [facility];   Service: Orthopedics;  Laterality: Right;  . AMPUTATION Right 02/23/2014   Procedure: AMPUTATION FOOT;  Surgeon: Newt Minion, MD;  Location: [facility];  Service: Orthopedics;  Laterality: Right;  Right Foot Revision Transmetatarsal Amputation  . I&D EXTREMITY Right 05/23/2013   Procedure: IRRIGATION AND DEBRIDEMENT RIGHT GREAT TOE;  Surgeon: Mauri Pole, MD;  Location: [facility];  Service: Orthopedics;  Laterality: Right;    Family History  Problem Relation Age of Onset  . Liver cancer Mother   . Lung cancer Mother   . Depression Mother   . Hypertension Father   . Depression Father   . Colon cancer Neg Hx     No Known Allergies  Current Outpatient Medications on File Prior to Visit  Medication Sig Dispense Refill  . ALPRAZolam (XANAX) 0.25 MG tablet Take by mouth.    Marland Kitchen aspirin 325 MG EC tablet Take 1 tablet (325 mg total) by mouth daily. 30 tablet 0  . atorvastatin (LIPITOR) 40 MG tablet Take 1 tablet (40 mg total) by mouth daily at 6 PM. 30 tablet 0  . clonazePAM (KLONOPIN) 0.5 MG tablet Take 1 tablet (0.5 mg total) by mouth 3 (three) times daily. 90 tablet 0  . COD LIVER OIL PO Take 1 mg by mouth daily.    . collagenase (SANTYL) ointment Apply topically.    Marland Kitchen doxycycline (VIBRAMYCIN) 100 MG capsule Take 1 capsule (100 mg total) by mouth 2 (two) times daily. 14 capsule 0  . glimepiride (AMARYL) 4 MG tablet take 1 tablet once daily 90 tablet 3  . glucose blood (ONETOUCH VERIO) test strip USE TO TEST BLOOD GLUCOSE 3 TIMES DAILY 100 each 12  . glucose blood test strip Test blood sugars 3 times daily Dx: E11.42 100 each 12  . glucose blood test strip Use as instructed 100 each 12  . insulin glargine (LANTUS) 100 unit/mL SOPN Inject 0.25 mLs (25 Units total) into the skin at bedtime. 15 mL 0  . Insulin Pen Needle (B-D UF III MINI PEN NEEDLES) 31G X 5 MM MISC Use as directed. 100 each 3  . latanoprost (XALATAN) 0.005 % ophthalmic solution Place 1 drop into both eyes at bedtime. 2.5 mL 12  .  lisinopril (PRINIVIL,ZESTRIL) 20 MG tablet take 1 tablet by mouth once daily 90 tablet 1  . Lurasidone HCl 60 MG TABS Take by mouth.    . ONE TOUCH LANCETS MISC USE TO CHECK BLOOD SUGAR 3 TIMES DAILY 200 each 3  . rosuvastatin (CRESTOR) 10 MG tablet Take by mouth.    . simvastatin (ZOCOR) 40 MG tablet  0  . trolamine salicylate (ASPERCREME) 10 % cream Apply topically 2 (two) times daily as needed for muscle pain. 85 g 0  . zinc gluconate 50 MG tablet Take by mouth.    . gabapentin (NEURONTIN) 400 MG capsule Take 2 capsules (800 mg total) by mouth 2 (two) times daily. 270 capsule 5   No current facility-administered medications on file prior to visit.     BP (!) 170/80 (BP Location: Left Arm, Patient Position: Sitting, Cuff Size: Normal)   Temp 98.5 F (36.9 C) (Oral)   Ht 5\' 5"  (1.651 m)   BMI 31.28 kg/m       Objective:   Physical Exam  Constitutional: She is oriented to person, place, and time. She appears well-developed and well-nourished. No distress.  Cardiovascular: Normal rate, regular rhythm, normal heart sounds and intact distal pulses. Exam reveals no gallop and no friction rub.  No murmur heard. Musculoskeletal:  Sitting in wheelchair.  She is able to get up out of her wheelchair with minimal assist but has a very slow unsteady gait  Neurological: She is alert and oriented to person, place, and time.  Skin: Skin is warm and dry. She is not diaphoretic.  Psychiatric: Her speech is normal. Judgment normal. She is withdrawn. Thought content is not paranoid. Cognition and memory are normal. She exhibits a depressed mood. She expresses no homicidal and no suicidal ideation. She expresses no suicidal plans and no homicidal plans.  Tearful through much of the exam  Nursing note and vitals reviewed.     Assessment & Plan:  1. Depression, unspecified depression type -PHQ 9 of 9 - Will start on Prozac due to history of depression and bipolar disorder.  She denies any suicidal  ideation during today's visit - FLUoxetine (PROZAC) 20 MG tablet; Take 1 tablet (20 mg total) by mouth daily.  Dispense: 30 tablet; Refill: 3 -We will up in 2 weeks or sooner if needed -Advised  to call 911 to get to the emergency room room immediately if she has any suicidal ideation 2. Gait instability -I do believe due to her chronic conditions and history of multiple CVAs that motorized wheelchair would greatly benefit this patient and aid her in being able to care for herself more completely. -We will write note and fax to her apartment complex about needing a new doorknob as well as a threshold barrier for her front door so that she can more easily get her wheelchair in and out.  [name], NP

## 2017-11-24 NOTE — Telephone Encounter (Signed)
Pt seen in [place] today with [facility].  No further action required.  Will close note.

## 2017-11-24 NOTE — Patient Instructions (Signed)
I have sent in prozac for your depression   I would like to see you back in two weeks

## 2017-11-26 ENCOUNTER — Encounter: Payer: Self-pay | Admitting: Adult Health

## 2017-11-26 ENCOUNTER — Telehealth: Payer: Self-pay | Admitting: Family Medicine

## 2017-11-26 NOTE — Telephone Encounter (Signed)
Copied from [place] [id number]. Topic: General - Other >> Nov 26, 2017  9:07 AM Valla Leaver wrote: Reason for CRM: [facility] manager, Hoyle Sauer, calling to get a letter from [employer] indicating why the patient needs her apartment doorknob changed to assist her with her disability. They need the letter from [facility] before they can change her doorknob. Please fax it to [phone].

## 2017-11-26 NOTE — Telephone Encounter (Signed)
Letter faxed.

## 2017-11-26 NOTE — Telephone Encounter (Signed)
Okay to write letter? Do you know what type of door knob she is requesting? Not sure if this was discussed at last visit.

## 2017-11-30 ENCOUNTER — Telehealth: Payer: Self-pay | Admitting: Adult Health

## 2017-11-30 ENCOUNTER — Encounter: Payer: Self-pay | Admitting: Family Medicine

## 2017-11-30 NOTE — Telephone Encounter (Signed)
Copied from [place]. Topic: Quick Communication - Rx Refill/Question >> Nov 30, 2017  1:24 PM Arletha Grippe wrote: Medication: cholesterol medication   Has the patient contacted their pharmacy? No.   (Agent: If no, request that the patient contact the pharmacy for the refill.)   Preferred Pharmacy (with phone number or street name): [facility] [address]  Pt needs refill on cholesterol med.  When I told her to call the pharmacy to at least get the name, she got argumentative and said no, that missy would know the name of the medicine. She says she has not had it in the last 2 weeks   Agent: Please be advised that RX refills may take up to 3 business days. We ask that you follow-up with your pharmacy.

## 2017-11-30 NOTE — Telephone Encounter (Signed)
Pt seen on 11/24/17 for wheelchair eval.  Order sent to [facility].

## 2017-11-30 NOTE — Telephone Encounter (Signed)
Copied from [place]. Topic: Inquiry >> Nov 30, 2017  4:07 PM Corie Chiquito, [place] wrote: Reason for CRM: Patient calling because she would like to speak with [facility] Nurse. Stated that it has been over a year and she still hasn't received her electric wheelchair.Stated that [facility] said all they are waiting on is an order.Patient stated that she is waiting for a home health referral as well. If someone could give her a call back about these two things at [phone]

## 2017-11-30 NOTE — Telephone Encounter (Signed)
Left a message for a return call.  Need to know what cholesterol med she is taking.  Pt has 3 listed on active med list.

## 2017-11-30 NOTE — Telephone Encounter (Signed)
Patient has Lipitor and Crestor on her list- although Lipitor seems to be the most current. Please review for refill.

## 2017-12-03 ENCOUNTER — Other Ambulatory Visit: Payer: Self-pay | Admitting: Adult Health

## 2017-12-03 ENCOUNTER — Telehealth: Payer: Self-pay | Admitting: *Deleted

## 2017-12-03 MED ORDER — ATORVASTATIN CALCIUM 40 MG PO TABS: 40.0000 mg | ORAL_TABLET | Freq: Every day | ORAL | 3 refills | Status: DC

## 2017-12-03 NOTE — Telephone Encounter (Signed)
Copied from [place]. Topic: Inquiry >> Nov 30, 2017  4:07 PM Corie Chiquito, [place] wrote: Reason for CRM: Patient calling because she would like to speak with [facility]. Stated that it has been over a year and she still hasn't received her electric wheelchair.Stated that [facility] said all they are waiting on is an order that they haven't gotten.Patient stated that she is waiting for a home health referral as well. If someone could give her a call back about these two things at [phone]  >> Dec 03, 2017 12:57 PM Boyd Kerbs wrote: Pt. Called back with fax # [phone]  This is for wheelchair and depends

## 2017-12-03 NOTE — Telephone Encounter (Signed)
Spoke to the pt and informed her that she has 3 cholesterol medications on her active medication list.  She is unsure which medication she is taking.  No longer has the bottles.  Please advise.  Thanks!

## 2017-12-03 NOTE — Telephone Encounter (Signed)
Order was refaxed today.  This is a duplicate phone note.  See 11/30/17 phone note.

## 2017-12-03 NOTE — Telephone Encounter (Signed)
Sent to the pharmacy by e-scribe. 

## 2017-12-03 NOTE — Telephone Encounter (Signed)
Has been re faxed.  Pt was notified to discuss adult diapers with [facility].

## 2017-12-03 NOTE — Telephone Encounter (Signed)
Ok to refill Lipitor 40 mg 90 + 3  D/c others

## 2017-12-03 NOTE — Telephone Encounter (Signed)
Ok to refill for one month  

## 2017-12-03 NOTE — Telephone Encounter (Signed)
Pt states [facility] doesn't have the order for the wheel chair.  Pt also wanting you to order depends from [facility] as well Pt says their number is [phone]

## 2017-12-07 NOTE — Telephone Encounter (Signed)
Denied.  Tara Hopkins does not prescribe this medication.  Message sent to the pharmacy.

## 2017-12-07 NOTE — Telephone Encounter (Signed)
Looks like [facility] has filled it in the past

## 2017-12-07 NOTE — Telephone Encounter (Signed)
Called to the pharmacy and left on machine. 

## 2017-12-07 NOTE — Telephone Encounter (Signed)
Tara Hopkins, are you filling this?

## 2017-12-08 ENCOUNTER — Other Ambulatory Visit: Payer: Self-pay | Admitting: Adult Health

## 2017-12-08 NOTE — Telephone Encounter (Signed)
Pt is calling stating the phone number [phone] for [facility] is not correct. She states she has been calling them for a few days about getting her supplies and they state they do not have any documentation of her. Please advise. Patient is best reached at [phone].

## 2017-12-09 ENCOUNTER — Telehealth: Payer: Self-pay | Admitting: *Deleted

## 2017-12-09 NOTE — Telephone Encounter (Signed)
Copied from [place]. Topic: Inquiry >> Dec 09, 2017  9:13 AM Scherrie Gerlach wrote: Reason for CRM: pt is calling back again to address the order for the wheelchair.  Pt states [facility] still does not have the order.  Also following up on request for the Depends.  Pt states Misty advised needs to come from the same place as the wheelchair.

## 2017-12-09 NOTE — Telephone Encounter (Signed)
Left message for patient to return phone call. Information faxed to [facility].

## 2017-12-09 NOTE — Telephone Encounter (Signed)
Pt called again to check on status of order. Advised her I would contact [facility] [place] and call her back.  Spoke with [facility] and she states that referral was "lost" in their system. She is going to call pt right now and set up appt.   Pt does need order for Depends to be sent to [facility].  Misty - see above. Thanks!

## 2017-12-09 NOTE — Telephone Encounter (Signed)
Patient called back and said the number is [phone] fax

## 2017-12-10 ENCOUNTER — Other Ambulatory Visit: Payer: Self-pay | Admitting: Adult Health

## 2017-12-10 ENCOUNTER — Ambulatory Visit: Payer: Medicare Other | Admitting: Podiatry

## 2017-12-13 ENCOUNTER — Telehealth: Payer: Self-pay | Admitting: Adult Health

## 2017-12-13 NOTE — Telephone Encounter (Signed)
Copied from [place] [phone]. Topic: Quick Communication - See Telephone Encounter >> Dec 13, 2017  8:11 AM Boyd Kerbs wrote: CRM for notification. See Telephone encounter for:   Pt. Is needing some depends supply, she asks to please call [phone] [employer] [facility]   12/13/17.

## 2017-12-13 NOTE — Telephone Encounter (Signed)
When would you like to see the pt for A1C follow up?  Last A1C 07/2017.

## 2017-12-14 ENCOUNTER — Encounter: Payer: Self-pay | Admitting: Family Medicine

## 2017-12-14 NOTE — Telephone Encounter (Signed)
Order faxed and received confirmation that the transmission was successful.

## 2017-12-14 NOTE — Telephone Encounter (Signed)
Sent to the pharmacy by e-scribe. 

## 2017-12-14 NOTE — Telephone Encounter (Signed)
Ok to refill for 30 days. She is coming in on 2/7

## 2017-12-15 ENCOUNTER — Telehealth: Payer: Self-pay | Admitting: Adult Health

## 2017-12-15 NOTE — Telephone Encounter (Signed)
Shelia Thorton,NP with [facility] calling to report findings after doing a Wellness Exam with the pt. Tara Hopkins states during the depression screening the pt was tearful and voiced that sometimes she feels like she would be better off dead. Tara Hopkins states that the pt had a depression screening score of 19. Pt denied having suicide thoughts at this time and denied having a plan. Tara Hopkins states that the pt was provided with a suicide hotline number if she began to have suicidal thoughts and was told to call 911 if she developed suicidal thoughts with a plan. Tara Hopkins states the pt verbalized understanding and agreed to follow the recommendations. Tara Hopkins can be contacted at [phone] for further questions or concerns.

## 2017-12-15 NOTE — Telephone Encounter (Signed)
Noted, pt to be notified by [facility].

## 2017-12-15 NOTE — Telephone Encounter (Signed)
Caller name: Colletta Maryland with [facility] Can be reached: [phone]  Reason for call: [facility] and [employer] do not cover incontinence supplies - she will call to notify pt

## 2017-12-16 ENCOUNTER — Ambulatory Visit (INDEPENDENT_AMBULATORY_CARE_PROVIDER_SITE_OTHER): Payer: Medicare Other | Admitting: Adult Health

## 2017-12-16 ENCOUNTER — Encounter: Payer: Self-pay | Admitting: Adult Health

## 2017-12-16 ENCOUNTER — Encounter: Payer: Self-pay | Admitting: Nurse Practitioner

## 2017-12-16 VITALS — BP 120/64 | Temp 99.6°F | Wt 184.0 lb

## 2017-12-16 DIAGNOSIS — E1142 Type 2 diabetes mellitus with diabetic polyneuropathy: Secondary | ICD-10-CM

## 2017-12-16 DIAGNOSIS — F329 Major depressive disorder, single episode, unspecified: Secondary | ICD-10-CM | POA: Diagnosis not present

## 2017-12-16 DIAGNOSIS — F32A Depression, unspecified: Secondary | ICD-10-CM

## 2017-12-16 LAB — POCT GLYCOSYLATED HEMOGLOBIN (HGB A1C): HEMOGLOBIN A1C: 6.8

## 2017-12-16 MED ORDER — INSULIN GLARGINE 100 UNITS/ML SOLOSTAR PEN: 25.0000 [IU] | PEN_INJECTOR | Freq: Every day | SUBCUTANEOUS | 3 refills | Status: DC

## 2017-12-16 MED ORDER — FLUOXETINE HCL 20 MG PO TABS: 40.0000 mg | ORAL_TABLET | Freq: Every day | ORAL | 0 refills | Status: DC

## 2017-12-16 NOTE — Patient Instructions (Signed)
I have increased your prozac to 40 mg   Please follow up with me in 2 months or sooner if needed

## 2017-12-16 NOTE — Progress Notes (Signed)
Subjective:    Patient ID: Tara Hopkins, female    DOB: 11-Mar-1951, 67 y.o.   MRN: [id number]  HPI  67 year old female who  has a past medical history of Anemia, Anxiety, Bipolar 1 disorder ([place]), Cataracts, bilateral, Depression, Diabetes mellitus without complication ([place]), Glaucoma, History of blood transfusion, History of hyperbaric oxygen therapy, Hypercholesteremia, Hypertension, Peripheral neuropathy, and Stroke ([place]) (02/2015).  She presents to the office today for follow up regarding depression. During her last visit we started her Prozac 20 mg for severe depression. She reports that she is less depressed but continues to feel lonely and has crying spells. Her family visits her a few times a week and her daughter is going to take her to see some assisted living facilities. She is supposed to be going next week to get her motorized wheel chair.   She continues to deny any suicidal ideation   We are also following up on her diabetes today. Her last A1c was 9.7 - six months ago. She has been using her medications as directed and is watching her diet and eating healthier. She does not monitor her blood sugars on a constant basis   Review of Systems  Constitutional: Negative.   Respiratory: Negative.   Cardiovascular: Negative.   Psychiatric/Behavioral: Positive for decreased concentration. Negative for sleep disturbance and suicidal ideas. The patient is not nervous/anxious.        Depression   All other systems reviewed and are negative.  See HPI   Past Medical History:  Diagnosis Date  . Anemia   . Anxiety   . Bipolar 1 disorder ([place])   . Cataracts, bilateral   . Depression   . Diabetes mellitus without complication ([place])   . Glaucoma   . History of blood transfusion   . History of hyperbaric oxygen therapy    pt reports 80 treatments.  . Hypercholesteremia   . Hypertension   . Peripheral neuropathy   . Stroke [facility]) 02/2015    Social History   Socioeconomic  History  . Marital status: Married    Spouse name: Not on file  . Number of children: 3  . Years of education: 46  . Highest education level: Not on file  Social Needs  . Financial resource strain: Not on file  . Food insecurity - worry: Not on file  . Food insecurity - inability: Not on file  . Transportation needs - medical: Not on file  . Transportation needs - non-medical: Not on file  Occupational History  . Occupation: Disbaled  Tobacco Use  . Smoking status: Former Smoker    Packs/day: 0.01    Years: 20.00    Pack years: 0.20    Types: Cigarettes    Last attempt to quit: 09/26/2017    Years since quitting: 0.2  . Smokeless tobacco: Never Used  Substance and Sexual Activity  . Alcohol use: Yes    Alcohol/week: 0.0 oz    Comment: beer every 3 months  . Drug use: No  . Sexual activity: Not on file  Other Topics Concern  . Not on file  Social History Narrative   Lives at home alone.   Right-handed.   No caffeine use.   Oldest of 12 children     Past Surgical History:  Procedure Laterality Date  . AMPUTATION Right 12/15/2013   Procedure: Right Transmetatarsal Amputation;  Surgeon: Newt Minion, MD;  Location: [facility];  Service: Orthopedics;  Laterality: Right;  . AMPUTATION Right 02/23/2014  Procedure: AMPUTATION FOOT;  Surgeon: Newt Minion, MD;  Location: [facility];  Service: Orthopedics;  Laterality: Right;  Right Foot Revision Transmetatarsal Amputation  . I&D EXTREMITY Right 05/23/2013   Procedure: IRRIGATION AND DEBRIDEMENT RIGHT GREAT TOE;  Surgeon: Mauri Pole, MD;  [facility];  Service: Orthopedics;  Laterality: Right;    Family History  Problem Relation Age of Onset  . Liver cancer Mother   . Lung cancer Mother   . Depression Mother   . Hypertension Father   . Depression Father   . Colon cancer Neg Hx     No Known Allergies  Current Outpatient Medications on File Prior to Visit  Medication Sig Dispense Refill  . ALPRAZolam (XANAX) 0.25 MG  tablet Take by mouth.    Marland Kitchen aspirin 325 MG EC tablet Take 1 tablet (325 mg total) by mouth daily. 30 tablet 0  . atorvastatin (LIPITOR) 40 MG tablet Take 1 tablet (40 mg total) by mouth daily at 6 PM. 90 tablet 3  . clonazePAM (KLONOPIN) 0.5 MG tablet take 1 tablet by mouth three times a day 90 tablet 0  . COD LIVER OIL PO Take 1 mg by mouth daily.    . collagenase (SANTYL) ointment Apply topically.    Marland Kitchen doxycycline (VIBRAMYCIN) 100 MG capsule Take 1 capsule (100 mg total) by mouth 2 (two) times daily. 14 capsule 0  . FLUoxetine (PROZAC) 20 MG tablet Take 1 tablet (20 mg total) by mouth daily. 30 tablet 3  . glimepiride (AMARYL) 4 MG tablet take 1 tablet once daily 30 tablet 0  . glucose blood (ONETOUCH VERIO) test strip USE TO TEST BLOOD GLUCOSE 3 TIMES DAILY 100 each 12  . glucose blood test strip Test blood sugars 3 times daily Dx: E11.42 100 each 12  . glucose blood test strip Use as instructed 100 each 12  . insulin glargine (LANTUS) 100 unit/mL SOPN Inject 0.25 mLs (25 Units total) into the skin at bedtime. 15 mL 0  . Insulin Pen Needle (B-D UF III MINI PEN NEEDLES) 31G X 5 MM MISC Use as directed. 100 each 3  . latanoprost (XALATAN) 0.005 % ophthalmic solution Place 1 drop into both eyes at bedtime. 2.5 mL 12  . lisinopril (PRINIVIL,ZESTRIL) 20 MG tablet take 1 tablet by mouth once daily 90 tablet 1  . Lurasidone HCl 60 MG TABS Take by mouth.    . ONE TOUCH LANCETS MISC USE TO CHECK BLOOD SUGAR 3 TIMES DAILY 200 each 3  . trolamine salicylate (ASPERCREME) 10 % cream Apply topically 2 (two) times daily as needed for muscle pain. 85 g 0  . zinc gluconate 50 MG tablet Take by mouth.    . gabapentin (NEURONTIN) 400 MG capsule Take 2 capsules (800 mg total) by mouth 2 (two) times daily. 270 capsule 5   No current facility-administered medications on file prior to visit.     BP 120/64 (BP Location: Left Arm)   Temp 99.6 F (37.6 C) (Oral)   Wt 184 lb (83.5 kg)   LMP  (Exact Date)   BMI  30.62 kg/m       Objective:   Physical Exam  Constitutional: She is oriented to person, place, and time. She appears well-developed and well-nourished. No distress.  Tearful during exam   Cardiovascular: Normal rate, regular rhythm, normal heart sounds and intact distal pulses. Exam reveals no gallop and no friction rub.  No murmur heard. Pulmonary/Chest: Effort normal and breath sounds normal. No  respiratory distress. She has no wheezes. She has no rales. She exhibits no tenderness.  Musculoskeletal:  In wheelchair    Neurological: She is alert and oriented to person, place, and time.  Skin: Skin is warm and dry. No rash noted. She is not diaphoretic. No erythema. No pallor.  Psychiatric: She has a normal mood and affect. Her behavior is normal. Judgment and thought content normal.  Nursing note and vitals reviewed.     Assessment & Plan:  1. DM type 2 with diabetic peripheral neuropathy (HCC) - POCT A1C- 6.8 - has improved. Congratulated on improvement in diet  - Samples of insulin given  - Lantus refilled   2. Depression, unspecified depression type - Seems to be improving. I think starting the process of assisted living facilities would be a good idea - I am going to increase her Prozac from 20 mg to 40 mg  - Follow up in 2 months or sooner if needed - FLUoxetine (PROZAC) 20 MG tablet; Take 2 tablets (40 mg total) by mouth daily.  Dispense: 180 tablet; Refill: 0 - If she has any suicidal ideation then go straight to the ER    [name], NP

## 2017-12-16 NOTE — Telephone Encounter (Signed)
Noted  

## 2017-12-22 ENCOUNTER — Ambulatory Visit: Payer: Medicare Other | Attending: Adult Health | Admitting: Physical Therapy

## 2017-12-22 DIAGNOSIS — M6281 Muscle weakness (generalized): Secondary | ICD-10-CM | POA: Insufficient documentation

## 2017-12-22 DIAGNOSIS — R2689 Other abnormalities of gait and mobility: Secondary | ICD-10-CM

## 2017-12-23 ENCOUNTER — Telehealth: Payer: Self-pay | Admitting: Family Medicine

## 2017-12-23 ENCOUNTER — Encounter: Payer: Self-pay | Admitting: Physical Therapy

## 2017-12-23 DIAGNOSIS — G5602 Carpal tunnel syndrome, left upper limb: Secondary | ICD-10-CM

## 2017-12-23 NOTE — Telephone Encounter (Signed)
Copied from [place]. Topic: Referral - Request >> Dec 23, 2017 12:49 PM Ahmed Prima L wrote: Patient is requesting to see Dr Paulla Fore for carpal tunnel in right hand, she said she had a referral months ago but It has been canceled. Call back [phone]

## 2017-12-23 NOTE — Therapy (Signed)
[facility] [address] [place], [place], [address] Phone: [phone]   Fax:  [phone]  Physical Therapy Evaluation  Patient Details  Name: Labrisha Wuellner MRN: [id number] Date of Birth: [birth year] Referring Provider: [name], NP   Encounter Date: 12/22/2017  PT End of Session - 12/23/17 2141    Visit Number  1    Authorization Type  [employer] Medicare    PT Start Time  7035    PT Stop Time  1515    PT Time Calculation (min)  59 min       Past Medical History:  Diagnosis Date  . Anemia   . Anxiety   . Bipolar 1 disorder ([place])   . Cataracts, bilateral   . Depression   . Diabetes mellitus without complication ([place])   . Glaucoma   . History of blood transfusion   . History of hyperbaric oxygen therapy    pt reports 80 treatments.  . Hypercholesteremia   . Hypertension   . Peripheral neuropathy   . Stroke [facility]) 02/2015    Past Surgical History:  Procedure Laterality Date  . AMPUTATION Right 12/15/2013   Procedure: Right Transmetatarsal Amputation;  Surgeon: Newt Minion, MD;  Location: [facility];  Service: Orthopedics;  Laterality: Right;  . AMPUTATION Right 02/23/2014   Procedure: AMPUTATION FOOT;  Surgeon: Newt Minion, MD;  Location: [facility];  Service: Orthopedics;  Laterality: Right;  Right Foot Revision Transmetatarsal Amputation  . I&D EXTREMITY Right 05/23/2013   Procedure: IRRIGATION AND DEBRIDEMENT RIGHT GREAT TOE;  Surgeon: Mauri Pole, MD;  Location: [facility];  Service: Orthopedics;  Laterality: Right;    There were no vitals filed for this visit.   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  No/denies         [facility] PT Assessment - 12/23/17 0001      Assessment   Medical Diagnosis   h/o CVA's (most recent Oct. 2018)    Referring Provider  [name], NP    Onset Date/Surgical Date  -- Oct. 2018      Precautions   Precautions  Fall      Balance Screen   Has the patient fallen in the past 6 months  No             Objective measurements completed on examination: See above findings.         Mobility/Seating Evaluation    PATIENT INFORMATION: Name: Zakyla Tonche DOB: 10/17/1951  Sex: F Date seen: 12-22-17 Time: 1415  Address:  [address] [place], [place] [address] Physician: [name], NP This evaluation/justification form will serve as the LMN for the following suppliers: __________________________ Supplier: [employer] Contact Person: Felton Clinton, Wess Botts Phone:  [phone]   Seating Therapist: Guido Sander, PT Phone:   [phone]   Phone: [phone]    Spouse/Parent/Caregiver name: ?????  Phone number: ????? Insurance/Payer: [facility] Medicare     Reason for Referral: power wheechair evaluation  Patient/Caregiver Goals: obtain power wheelchair  Patient was seen for face-to-face evaluation for new power wheelchair.  Also present was [employer] to discuss recommendations and  wheelchair options.  Further paperwork was completed and sent to vendor.  Patient appears to qualify for power mobility device at this time per objective findings.   MEDICAL HISTORY: Diagnosis: Primary Diagnosis: CVA x 2 Onset: April 2016 and Oct. 2018 Diagnosis: s/p  Transmetatarsal amputation Rt foot 12-15-13 with revision 02-23-14:  DM Type II:  Peripheral Neuropathy   _0 Progressive Disease Relevant past and future surgeries: Rt transmetatarsal amputation 12-15-13 with revision 02-23-14 due to osteomyelitis   Height: 5'5" Weight: 184# Explain recent changes or trends in weight: ?????   History including Falls: Pt reports she has been unable to ambulate since she had a 2nd stroke in Oct. 2018.  Pt has had manual wheelchair since she had metatarsal  amputation Rt foot in 2015, but has not used it very much until most recent stroke in Oct. . 2018. Pt reports having had 1 fall approximately 6 months ago - did not sustain injury.  Pt lives alone but is at high risk for falls due to unsteady gait and decreased balance.     HOME ENVIRONMENT: _1 House  _2 Condo/town home  _3 Apartment  _4 Assisted Living    _5 Lives Alone _6  Lives with Others                                                                                          Hours with caregiver: ?????  _7 Home is accessible to patient           Stairs      _8 Yes _9  No     Ramp _10 Yes _11 No Comments:  pt lives in handicapped accessible apartment   COMMUNITY ADL: TRANSPORTATION: _12 Car    _13 Van    <ZOXWRUEAVWUJWJXB>_1<\/YNWGNFAOZHYQMVHQ>_46 Public Transportation    _15 Adapted w/c Lift    _16 Ambulance    _17 Other:       _18 Sits in wheelchair during transport  Employment/School: ????? Specific requirements pertaining to mobility ?????  Other: ?????    FUNCTIONAL/SENSORY PROCESSING SKILLS:  Handedness:   _19 Right     _20 Left    _21 NA  Comments:  ?????  Functional Processing Skills for Wheeled Mobility _22 Processing Skills are adequate for safe wheelchair operation  Areas of concern than may interfere with safe operation of wheelchair Description of problem   _23  Attention to environment      _24 Judgment      _25  Hearing  _26  Vision or visual processing      _27 Motor Planning  _28  Fluctuations in Behavior  ?????    VERBAL COMMUNICATION: _29 WFL receptive _30  WFL expressive _31 Understandable  _32 Difficult to understand  _33 non-communicative _34  Uses an augmented communication device  CURRENT SEATING / MOBILITY: Current Mobility Base:  _35 None _36 Dependent _37 Manual _38 Scooter _39 Power  Type of Control: ?????  Manufacturer:  [employer] IIISize:  18x18Age: 6 yrs  Current Condition of Mobility Base:  the manual wheelchair is no longer meeting pt's needs   Current Wheelchair components:  ?????  Describe posture in present seating system:  ?????       SENSATION and SKIN ISSUES: Sensation _40 Intact  _41 Impaired _42 Absent  Level of sensation: Rt hand impaired sensation due to CTS Pressure Relief: Able to perform effective pressure relief :    [  x]Yes  _0  No Method: ???? If not, Why?: ?????  Skin Issues/Skin Integrity Current Skin Issues  _1 Yes _2 No _3 Intact _4  Red area_5  Open Area  _6 Scar Tissue _7 At risk from prolonged sitting Where  ?????  History of Skin Issues  _8 Yes _9 No Where  ????? When  ?????  Hx of skin flap surgeries  _10 Yes _11 No Where  ????? When  ?????  Limited sitting tolerance _12 Yes _13 No Hours spent sitting in wheelchair daily: 8+  Complaint of Pain:  Please describe: None reported   Swelling/Edema: None reported   ADL STATUS (in reference to wheelchair use):  Indep Assist Unable Indep with Equip Not assessed Comments  Dressing ????? ????? ????? X ????? dresses from wheelchair  Eating X ????? ????? ????? ????? ?????  Toileting ????? ????? ????? X ????? Uses elevated commode seat and grab bars  Bathing ????? ????? ????? X ????? Uses tub bench  Grooming/Hygiene ????? ????? ????? X ????? Performs from wheelchair  Meal Prep ????? X ????? ????? ????? pt receives Meals on Wheels  IADLS ????? X ????? ????? ????? Has assistance and uses motorized cart in store  Bowel Management: _14 Continent  _15 Incontinent  _16 Accidents Comments:  ?????  Bladder Management: _17 Continent  _18 Incontinent  _19 Accidents Comments:  ?????     WHEELCHAIR SKILLS: Manual w/c Propulsion: _20 UE or LE strength and endurance sufficient to participate in ADLs using manual wheelchair Arm : _21 left _22 right   _23 Both      Distance: ????? Foot:  _24 left _25 right   _26 Both  Operate Scooter: _27  Strength, hand grip, balance and transfer appropriate for use _28 Living environment is accessible for use of scooter  Operate Power w/c:  _29  Std. Joystick   _30  Alternative Controls Indep _31  Assist _32  Dependent/unable _33  N/A _34   _35 Safe          _36   Functional      Distance: 120  Bed confined without wheelchair _37  Yes _38  No   STRENGTH/RANGE OF MOTION:  Active  Range of Motion Strength  Shoulder Rt shoulder flexion 118 degrees:  Lt shoulder flexion 106 degrees:  Rt shoulder abdct = 114:  Lt shoulder abdct= 104 degrees 3-/5 bil. UE's  Elbow WNL's bil. LE's WNL's bil. LE's  Wrist/Hand WFL's bil. LE's WFL's bil. UE's  Hip WFL's bil. LE's 3-/5 bil. hip flexors  Knee WFL's bil. LE's 4/5 bil. quads:  4-/5 Lt hamstrings:  Rt hamstrings 4/5  Ankle WFLs' bil. LE's bil. dorsiftlexors 3+/5;  Lt plantartflexors 3/5;  Rt plantarflexors at least 2/5 as tested in seated position     MOBILITY/BALANCE:  _39  Patient is totally dependent for mobility  ?????    Balance Transfers Ambulation  Sitting Balance: Standing Balance: _40  Independent _41  Independent/Modified Independent  _42  WFL     _43  WFL _44  Supervision _45  Supervision  _46  Uses UE for balance  _47  Supervision _48  Min Assist _49  Ambulates with Assist  20' - pt has unsteady gait with shuffling gait pattern    _50  Min Assist _51  Min assist _52  Mod Assist _53  Ambulates with Device:      _54  RW  _55  StW  _56  Cane  _57    _58  Mod Assist _59  Mod assist _60  Max assist   _61  Max Assist _62  Max assist _63  Dependent _64  Indep. Short Distance Only  _65  Unable _66  Unable _67  Lift / Sling Required Distance (in feet)  ?????   _68  Sliding board _69  Unable to Ambulate (see explanation below)  Cardio Status:  _70 Intact  _71  Impaired   _72   NA     ?????  Respiratory Status:  _0 Intact   _1 Impaired   _2 NA     ?????  Orthotics/Prosthetics: ?????  Comments (Address manual vs power w/c vs scooter): Pt has decreased standing balance and is unable to stand unsupported; pt's Timed Up and Go score = 1"9.93 secs with use of RW; scores > 13.5 secs are indicative of fall risk and scores >30 secs are indicative of ADL dysfunction.  Pt amb. with significant gait deviations including decreased step length with shuffling gait with decreased foot  clearance of Rt and Lt feet in swing phase of gait .  Pt is unable to functionally and effectively propel a manual wheelchair due to dereased strength in bil. shoulder musc. and  decreased AROM.  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  Pt is unable to operate a scooter due to her inability to safely transfer on and off the platform of a scooter. Pt requires a power wheelchair for indepence and safety with ADL's in her home environment.  Pt currently lives alone and is at very high risk of falling; a power wheelchair will greatly reduce her fall risk with mobility related ADL's.           Anterior / Posterior Obliquity Rotation-Pelvis ?????  PELVIS    _3  _4  _5   Neutral Posterior Anterior  _6  _7  _8   WFL Rt elev Lt elev  _9  _10  _11   WFL Right Left                      Anterior    Anterior     _12  Fixed _13  Other _14  Partly Flexible _15  Flexible   _16  Fixed _17  Other _18  Partly Flexible  _19  Flexible  _20  Fixed _21  Other _22  Partly Flexible  _23  Flexible   TRUNK  _24  _25  _26   WFL ? Thoracic ? Lumbar  Kyphosis Lordosis  _27  _28  _29   WFL Convex Convex  Right Left _30 c-curve _31 s-curve _32 multiple  _33  Neutral _34  Left-anterior _35  Right-anterior     _36  Fixed _37  Flexible _38  Partly Flexible _39  Other  _40  Fixed _41  Flexible _42  Partly Flexible _43  Other  _44  Fixed             _45  Flexible _46  Partly Flexible _47  Other    Position Windswept  ?????  HIPS          _48            _49               _50    Neutral       Abduct        ADduct         _51           _52            _53   Neutral Right           Left      _54  Fixed _55  Subluxed _56  Partly Flexible _57  Dislocated _58  Flexible  _59  Fixed _60  Other _61  Partly Flexible  _62  Flexible                 Foot Positioning Knee Positioning  ?????    _63  WFL  _64 Lt _65 Rt _66  WFL  _67 Lt _68 Rt    KNEES ROM concerns: ROM concerns:    & Dorsi-Flexed _69 Lt _70 Rt ?????    FEET Plantar Flexed _71 Lt _72 Rt  Inversion                 _0 Lt _1 Rt      Eversion                 _2 Lt _3 Rt     HEAD _4  Functional _5  Good Head Control  ?????  & _6  Flexed         _7  Extended _8  Adequate Head Control    NECK _9  Rotated  Lt  _10  Lat Flexed Lt _11  Rotated  Rt _12  Lat Flexed Rt _13  Limited Head Control     _14  Cervical Hyperextension _15  Absent  Head Control     SHOULDERS ELBOWS WRIST& HAND ?????      Left     Right    Left     Right    Left     Right   U/E _16 Functional           _17 Functional WNL's WNL's _18 Fisting             _19 Fisting      _20 elev   _21 dep      _22 elev   _23 dep       _24 pro -_25 retract     _26 pro  _27 retract _28 subluxed             _29 subluxed           Goals for Wheelchair Mobility  _30  Independence with mobility in the home with motor related ADLs (MRADLs)  _31  Independence with MRADLs in the community _32  Provide dependent mobility  _33  Provide recline     _34 Provide tilt   Goals for Seating system _35  Optimize pressure distribution _36  Provide support needed to facilitate function or safety _37  Provide corrective forces to assist with maintaining or improving posture _38  Accommodate client's posture:   current seated postures and positions are not flexible or will not tolerate corrective forces _39  Client to be independent with relieving pressure in the wheelchair _40 Enhance physiological function such as breathing, swallowing, digestion  Simulation ideas/Equipment trials:????? State why other equipment was unsuccessful:?????   MOBILITY BASE RECOMMENDATIONS and JUSTIFICATION: MOBILITY COMPONENT JUSTIFICATION  Manufacturer: [employer] manager: [employer]   Size: Width 18Seat Depth 20 _41 provide transport from point A to B      _42 promote Indep mobility  _43 is not a safe, functional ambulator _44 walker or cane inadequate _45 non-standard width/depth necessary to accommodate anatomical measurement _46  ?????  _47 Manual Mobility Base _48 non-functional ambulator    _49 Scooter/POV  _50 can safely operate  _51 can  safely transfer   _52 has adequate trunk stability  _53 cannot functionally propel manual w/c  _54 Power Mobility Base  _55 non-ambulatory  _56 cannot functionally propel manual wheelchair  _57  cannot functionally and safely operate scooter/POV _58 can safely operate and willing to  _59 Stroller Base _60 infant/child  _61 unable to propel manual wheelchair _62 allows for growth _63 non-functional ambulator _64 non-functional UE _65 Indep mobility is not a goal at this time  _66 Tilt  _67 Forward _68 Backward _69 Powered tilt  _70 Manual tilt  _71 change position against gravitational force on head and shoulders  _72 change position for pressure relief/cannot weight shift _73 transfers  _74 management of tone _75 rest periods _76 control edema _77 facilitate postural control  _78  ?????  _79 Recline  _80 Power recline on power base _81 Manual recline on manual base  _82 accommodate femur to back angle  _83 bring to full recline for ADL care  _84 change position for pressure relief/cannot weight shift _85 rest periods _86 repositioning for transfers or clothing/diaper /catheter changes _87 head positioning  _88 Lighter weight required _89 self- propulsion  _90 lifting _91  ?????  _92 Heavy Duty required _93 user  weight greater than 250# _0 extreme tone/ over active movement _1 broken frame on previous chair _2  ?????  _3  Back  _4  Angle Adjustable _5  Custom molded Captain's back  _6 postural control _7 control of tone/spasticity _8 accommodation of range of motion _9 UE functional control _10 accommodation for seating system _11  ????? _12 provide lateral trunk support _13 accommodate deformity _14 provide posterior trunk support _15 provide lumbar/sacral support _16 support trunk in midline _17 Pressure relief over spinal processes  _18  Seat Cushion Captain's seat _19 impaired sensation  _20 decubitus ulcers present _21 history of pressure ulceration _22 prevent pelvic extension _23 low maintenance  _24 stabilize pelvis  _25 accommodate obliquity _26 accommodate multiple  deformity _27 neutralize lower extremity position _28 increase pressure distribution _29  ?????  _30  Pelvic/thigh support  _31  Lateral thigh guide _32  Distal medial pad  _33  Distal lateral pad _34  pelvis in neutral _35 accommodate pelvis _36  position upper legs _37  alignment _38  accommodate ROM _39  decr adduction _40 accommodate tone _41 removable for transfers _42 decr abduction  _43  Lateral trunk Supports _44  Lt     _45  Rt _46 decrease lateral trunk leaning _47 control tone _48 contour for increased contact _49 safety  _50 accommodate asymmetry _51  ?????  _52  Mounting hardware  _53 lateral trunk supports  _54 back   _55 seat _56 headrest      _57  thigh support _58 fixed   _59 swing away _60 attach seat platform/cushion to w/c frame _61 attach back cushion to w/c frame _62 mount postural supports _63 mount headrest  _64 swing medial thigh support away _65 swing lateral supports away for transfers  _66  ?????    Armrests  _67 fixed _68 adjustable height _69 removable   _70 swing away  _71 flip back   _72 reclining _73 full length pads _74 desk    _75 pads tubular  _76 provide support with elbow at 90   _77 provide support for w/c tray _78 change of height/angles for variable activities _79 remove for transfers _80 allow to come closer to table top _81 remove for access to tables _82  ?????  Hangers/ Leg rests  _83 60 _84 70 _85 90 _86 elevating _87 heavy duty  _88 articulating _89 fixed _90 lift off _91 swing away     _92 power _93 provide LE support  _94 accommodate to hamstring tightness _95 elevate legs during recline   _96 provide change in position for Legs _97 Maintain placement of feet on footplate _98 durability _99 enable transfers _100 decrease edema _101 Accommodate lower leg length _102  ?????  Foot support Footplate    <WGNFAOZHYQMVHQIO>_9<\/GEXBMWUXLKGMWNUU>_725 Lt  _104  Rt  _105  Center mount _106 flip up     _107 depth/angle adjustable _108 Amputee adapter    _109  Lt     _110  Rt _111 provide foot support _112 accommodate to ankle ROM _113 transfers _114 Provide support for residual extremity _115  allow foot to go under wheelchair base _116   decrease tone  _117  ?????  _118  Ankle strap/heel loops _119 support foot on foot support _120 decrease extraneous movement _121 provide input to heel  _122 protect foot  Tires: _123 pneumatic  _124 flat free inserts  _125 solid  _126 decrease maintenance  _127 prevent frequent flats _128 increase shock absorbency _129 decrease pain from road shock _130 decrease spasms from road shock _131  ?????  _132  Headrest  _133 provide posterior head support _134 provide posterior neck support _135 provide lateral head support _136 provide anterior head support _137 support during tilt and recline _138 improve feeding   _139 improve respiration _140 placement of switches _141 safety  _142 accommodate ROM  _143 accommodate tone _144 improve visual orientation  _145  Anterior chest strap _146  Vest _147  Shoulder retractors  _148 decrease forward movement of shoulder _149 accommodation of TLSO _150 decrease forward movement of trunk _151 decrease shoulder elevation _152 added abdominal support _153 alignment _154 assistance with shoulder control  _155  ?????  Pelvic Positioner _156 Belt _157 SubASIS bar _158 Dual Pull _159 stabilize tone _160 decrease falling out of chair/ **will not Decr potential for sliding due to pelvic tilting _161 prevent excessive rotation _162 pad for protection over boney prominence _163 prominence comfort _164 special  pull angle to control rotation _0  ?????  Upper Extremity Support _1 L   _2  R _3 Arm trough    _4 hand support _5  tray       _6 full tray _7 swivel mount _8 decrease edema      _9 decrease subluxation   _10 control tone   _11 placement for AAC/Computer/EADL _12 decrease gravitational pull on shoulders _13 provide midline positioning _14 provide support to increase UE function _15 provide hand support in natural position _16 provide work surface   POWER WHEELCHAIR CONTROLS  _17 Proportional  _18 Non-Proportional Type Joystick _19 Left  _20 Right _21 provides access for controlling wheelchair   _22 lacks motor control to operate proportional drive control <AVWUJWJXBJYNWGNF>_6<\/OZHYQMVHQIONGEXB>_28 unable to understand proportional  controls  Actuator Control Module  _24 Single  _25 Multiple   _26 Allow the client to operate the power seat function(s) through the joystick control   _27 Safety Reset Switches _28 Used to change modes and stop the wheelchair when driving in latch mode    _29 [employer]   _30 programming for accurate control _31 progressive Disease/changing condition _32 non-proportional drive control needed _33 Needed in order to operate power seat functions through joystick control   _34 Display box _35 Allows user to see in which mode and drive the wheelchair is set  _36 necessary for alternate controls    _37 Digital interface electronics _38 Allows w/c to operate when using alternative drive controls  <UXLKGMWNUUVOZDGU>_4<\/QIHKVQQVZDGLOVFI>_43 ASL Head Array _40 Allows client to operate wheelchair  through switches placed in tri-panel headrest  _41 Sip and puff with tubing kit _42 needed to operate sip and puff drive controls  <PIRJJOACZYSAYTKZ>_6<\/WFUXNATFTDDUKGUR>_42 Upgraded tracking electronics _44 increase safety when driving <HCWCBJSEGBTDVVOH>_6<\/WVPXTGGYIRSWNIOE>_70 correct tracking when on uneven surfaces  _46 [facility] for switches or joystick _47 Attaches switches to w/c  _48 Swing away for access or transfers _49 midline for optimal placement _50 provides for consistent access  _51 Attendant controlled joystick plus mount _52 safety _53 long distance driving <JJKKXFGHWEXHBZJI>_9<\/CVELFYBOFBPZWCHE>_52 operation of seat functions _55 compliance with transportation regulations _56  ?????    Rear wheel placement/Axle adjustability _57 None _58 semi adjustable _59 fully adjustable  _60 improved UE access to wheels _61 improved stability _62 changing angle in space for improvement of postural stability _63 1-arm drive access <DPOEUMPNTIRWERXV>_4<\/MGQQPYPPJKDTOIZT>_24 amputee pad placement _65  ?????  Wheel rims/ hand rims  _66 metal  _67 plastic coated _68 oblique projections _69 vertical projections _70 Provide ability to propel manual wheelchair  _71  Increase self-propulsion with hand weakness/decreased grasp  Push handles _72 extended  _73 angle adjustable  _74 standard _75 caregiver access _76 caregiver assist _77 allows "hooking" to enable increased ability to perform  ADLs or maintain balance  One armed device  _78 Lt   _79 Rt _80 enable propulsion of manual wheelchair with one arm   _81  ?????   Brake/wheel lock extension _82  Lt   _83  Rt _84 increase indep in applying wheel locks   _85 Side guards _86 prevent clothing getting caught in wheel or becoming soiled _87  prevent skin tears/abrasions  Battery: U1's x 2 _88 to power wheelchair ?????  Other: ????? ????? ?????  The above equipment has a life- long use expectancy. Growth and changes in medical and/or functional conditions would be the exceptions. This is to certify that the therapist has no financial relationship with durable medical provider or manufacturer. The therapist will not receive remuneration of any kind for the equipment recommended in this evaluation.   Patient has mobility limitation that significantly impairs safe, timely participation in one or more mobility related ADL's.  (bathing, toileting, feeding, dressing, grooming, moving from room to room)                                                             [  x] Yes _0  No Will mobility device sufficiently improve ability to participate and/or be aided in participation of MRADL's?         _1  Yes _2  No Can limitation be compensated for with use of a cane or walker?                                                                                _3  Yes _4  No Does patient or caregiver demonstrate ability/potential ability & willingness to safely use the mobility device?   _5  Yes _6  No Does patient's home environment support use of recommended mobility device?                                                    _7  Yes _8  No Does patient have sufficient upper extremity function necessary to functionally propel a manual wheelchair?    _9  Yes _10  No Does patient have sufficient strength and trunk stability to safely operate a POV (scooter)?                                  _11  Yes _12  No Does patient need additional features/benefits provided by a power wheelchair for  MRADL's in the home?       _13  Yes _14  No Does the patient demonstrate the ability to safely use a power wheelchair?                                                              _15  Yes _16  No  Therapist Name Printed: Guido Sander, PT Date: 12-22-17  Therapist's Signature:   Date:   Supplier's Name Printed: [employer] Date: 12-22-17  Supplier's Signature:   Date:  Patient/Caregiver Signature:   Date:     This is to certify that I have read this evaluation and do agree with the content within:      Physician's Name Printed: [name], NP  Physician's Signature:  Date:     This is to certify that I, the above signed therapist have the following affiliations: _17  This DME provider _18  Manufacturer of recommended equipment _19  Patient's long term care facility _20  None of the above                        Plan - 12/23/17 2143    Clinical Impression Statement  Pt presents for power wheelchair evaluation - Josh Cadle, ATP present for eval    Clinical Presentation  Evolving    Clinical Decision Making  Moderate    PT Frequency  One time visit    PT Treatment/Interventions  -- wheelchair management       Patient will benefit from skilled therapeutic intervention in order to improve the following deficits and  impairments:  Abnormal gait, Decreased balance, Decreased strength  Visit Diagnosis: Muscle weakness (generalized) - Plan: PT plan of care cert/re-cert  Other abnormalities of gait and mobility - Plan: PT plan of care cert/re-cert     Problem List Patient Active Problem List   Diagnosis Date Noted  . History of stroke 10/05/2017  . Former smoker 10/05/2017  . Substance abuse ([place]) 10/05/2017  . Cerebrovascular accident (CVA) due to thrombosis of right middle cerebral artery ([place]) 07/23/2017  . Subcortical infarction ([place])   . Hyperlipidemia 07/22/2017  . Mild tetrahydrocannabinol (THC) abuse   . Smoker   . CVA (cerebral vascular accident)  ([place]) 07/21/2017  . Ischemic stroke ([place]) 07/21/2017  . Diabetes mellitus with complication ([place]) 56/31/4970  . Slurred speech 07/21/2017  . Bipolar disorder ([place]) 09/09/2015  . Left hemiparesis ([place]) 02/20/2015  . Cocaine abuse ([place])   . Stroke ([place]) 02/15/2015  . Osteomyelitis of foot, right, acute ([place]) 02/23/2014  . Status post transmetatarsal amputation of right foot ([place]) 12/15/2013  . DM type 2 with diabetic peripheral neuropathy ([place]) 05/21/2013  . Essential hypertension 05/21/2013  . Depression 05/21/2013  . Anemia 05/21/2013  . Renal insufficiency 05/21/2013    Alda Lea, PT 12/23/2017, 9:49 PM  [facility] [address] [place], [place], [address] Phone: [phone]   Fax:  [phone]  Name: Sya Nestler MRN: [id number] Date of Birth: [birth year]

## 2017-12-28 NOTE — Telephone Encounter (Signed)
Referral placed again to Dr. Paulla Fore for left side carpal tunnel.

## 2017-12-30 ENCOUNTER — Ambulatory Visit: Payer: Medicare Other | Admitting: Podiatry

## 2018-01-03 ENCOUNTER — Ambulatory Visit: Payer: Medicare Other | Admitting: Sports Medicine

## 2018-01-03 DIAGNOSIS — Z0289 Encounter for other administrative examinations: Secondary | ICD-10-CM

## 2018-01-05 ENCOUNTER — Encounter: Payer: Self-pay | Admitting: Sports Medicine

## 2018-01-07 ENCOUNTER — Ambulatory Visit: Payer: Medicare Other | Admitting: Sports Medicine

## 2018-01-07 NOTE — Progress Notes (Deleted)
  Tara Hopkins. [place], [place] at [facility]  Tara Hopkins - 67 y.o. female MRN [id number]  Date of birth: 1951/03/16  Visit Date: 01/07/2018  PCP: [name], NP   Referred by: [name], NP   Scribe for today's visit: Josepha Pigg, CMA     SUBJECTIVE:  [name] is here for No chief complaint on file. [place] Kitchen  Referred by: Ezzard Standing, NP Her carpal tunnel R hand/wrist symptoms INITIALLY: Began {Onset/duration} and {Mechanism/Context} Described as {mild/mod/severe:3049053} {*Pain/Burning/Swelling}, {Pain radiation-gi:31246} Worsened with {exacerbating} Improved with {alleviating} Additional associated symptoms include: {pertinent positives & negatives}    At this time symptoms {Improving/worsening/no change:60406} compared to onset {*describe change} She has been {*therapies/medications/ice/heat/bracing} {prev rx:317276}   ROS {ACTIONS;DENIES/REPORTS:21021675::"Denies"} night time disturbances. {ACTIONS;DENIES/REPORTS:21021675::"Denies"} fevers, chills, or night sweats. {ACTIONS;DENIES/REPORTS:21021675::"Denies"} unexplained weight loss. {ACTIONS;DENIES/REPORTS:21021675::"Denies"} personal history of cancer. {ACTIONS;DENIES/REPORTS:21021675::"Denies"} changes in bowel or bladder habits. {ACTIONS;DENIES/REPORTS:21021675::"Denies"} recent unreported falls. {ACTIONS;DENIES/REPORTS:21021675::"Denies"} new or worsening dyspnea or wheezing. {ACTIONS;DENIES/REPORTS:21021675::"Denies"} headaches or dizziness.  {ACTIONS;DENIES/REPORTS:21021675::"Denies"} numbness, tingling or weakness  In the extremities.  {ACTIONS;DENIES/REPORTS:21021675::"Denies"} dizziness or presyncopal episodes {ACTIONS;DENIES/REPORTS:21021675::"Denies"} lower extremity edema   { }

## 2018-01-10 ENCOUNTER — Telehealth: Payer: Self-pay

## 2018-01-10 NOTE — Telephone Encounter (Signed)
Patient has been No Show for 2 scheduled OV's with Dr. Paulla Fore.  She would like referral to another office.     Copied from [place] [phone]. Topic: General - Other >> Jan 10, 2018 11:53 AM Carolyn Stare wrote:  Pt call to say she was late for appt to see Dr Paulla Fore and when she tried to reschedule they told her he did not want to see her. She need to be referred to somebody else she said . [phone]

## 2018-01-11 ENCOUNTER — Other Ambulatory Visit: Payer: Self-pay | Admitting: Adult Health

## 2018-01-11 ENCOUNTER — Telehealth: Payer: Self-pay | Admitting: *Deleted

## 2018-01-11 NOTE — Telephone Encounter (Signed)
Copied from [place]. Topic: Referral - Request >> Jan 11, 2018 11:24 AM Scherrie Gerlach wrote: Reason for CRM: pt following up on request she said she asked [facility] for a nurse to come in a couple times a week.

## 2018-01-11 NOTE — Telephone Encounter (Signed)
Please advise. I do not see any recent referral or notes regarding HHRN.

## 2018-01-11 NOTE — Telephone Encounter (Signed)
Please arrange referral to another sports med provider.

## 2018-01-11 NOTE — Telephone Encounter (Signed)
Last OV: 12/16/17  Last filled: 12/07/17, #90, 0 RF Sig: take 1 tablet by mouth three times a day UDS: 07/21/17 in ED, negative for benzos, positive for [facility]

## 2018-01-12 ENCOUNTER — Telehealth: Payer: Self-pay | Admitting: Family Medicine

## 2018-01-12 DIAGNOSIS — I639 Cerebral infarction, unspecified: Secondary | ICD-10-CM

## 2018-01-12 DIAGNOSIS — Z89431 Acquired absence of right foot: Secondary | ICD-10-CM

## 2018-01-12 DIAGNOSIS — E1142 Type 2 diabetes mellitus with diabetic polyneuropathy: Secondary | ICD-10-CM

## 2018-01-12 DIAGNOSIS — R4781 Slurred speech: Secondary | ICD-10-CM

## 2018-01-12 DIAGNOSIS — I63311 Cerebral infarction due to thrombosis of right middle cerebral artery: Secondary | ICD-10-CM

## 2018-01-12 NOTE — Telephone Encounter (Signed)
Copied from [place]. Topic: Referral - Request >> Jan 11, 2018 11:24 AM Scherrie Gerlach wrote: Reason for CRM: pt following up on request she said she asked [facility] for a nurse to come in a couple times a week.   >> Jan 12, 2018  1:22 PM Lolita Rieger, [place] wrote: Pt called back and stated that her insurance will cover a home nurse if something is faxed to the company that will be used

## 2018-01-12 NOTE — Telephone Encounter (Signed)
She was to call her insurance and see if it was covered

## 2018-01-12 NOTE — Telephone Encounter (Signed)
Spoke to the pt.  She states her insurance does not pay for home health aid.  She will try to get the money.  Will call back when needed to order home health.

## 2018-01-12 NOTE — Telephone Encounter (Signed)
Copied from [place]. Topic: Referral - Request >> Jan 12, 2018  1:22 PM Lolita Rieger, [place] wrote: Pt called back and stated that her insurance will cover a home nurse if something is faxed to the company that will be used

## 2018-01-13 NOTE — Telephone Encounter (Signed)
Orders for home health placed in Epic.

## 2018-01-13 NOTE — Telephone Encounter (Signed)
Copied from [place]. Topic: Referral - Request >> Jan 13, 2018 10:49 AM Pilar Grammes F wrote: Patient would like to use [employer] to come out to the house

## 2018-01-13 NOTE — Telephone Encounter (Signed)
Call made to [place].  Gave pt's information and [employer] to contact the pt.

## 2018-01-17 ENCOUNTER — Telehealth: Payer: Self-pay | Admitting: Adult Health

## 2018-01-17 NOTE — Telephone Encounter (Signed)
Copied from [place]. Topic: Quick Communication - See Telephone Encounter >> Jan 17, 2018  9:01 AM Burnis Medin, NT wrote: CRM for notification. See Telephone encounter for: [name] Pt said she doesn't need a health nurse and would like to stay with [facility].  Pt would like a call back.   01/17/18.

## 2018-01-19 ENCOUNTER — Ambulatory Visit (INDEPENDENT_AMBULATORY_CARE_PROVIDER_SITE_OTHER): Payer: Medicare Other | Admitting: Family Medicine

## 2018-01-19 VITALS — BP 162/64 | Ht 65.0 in | Wt 184.0 lb

## 2018-01-19 DIAGNOSIS — M79641 Pain in right hand: Secondary | ICD-10-CM

## 2018-01-19 NOTE — Telephone Encounter (Signed)
Have been in contact with Tara Hopkins at [facility].  They have spoke to the pt.  Pt has notified them that she does not wish to have nursing services.  Only want a home health aid for help with ADLs.  [facility] is not able to send a home health aid without nursing services per insurance.  Nicki to call the pt and encourage evaluation for nursing services.  Advised that it would be beneficial for the pt.  Nicki to call back and inform me of outcome.

## 2018-01-19 NOTE — Patient Instructions (Addendum)
Your history and exam are reassuring. This is not clearly consistent with carpal tunnel syndrome - your ultrasound shows the nerve here isn't being pinched and it's not swollen as I would expect with carpal tunnel. Your pain also involves a larger area than carpal tunnel typically does. This may be related to the neuropathy you have in your feet. Keep using the wrist brace at night and as often as possible at night. Things to consider (I will send a message to your family doctor): increasing your gabapentin to three times a day, nerve conduction studies by neurology of your right arm. Follow up with me as needed otherwise.

## 2018-01-19 NOTE — Progress Notes (Signed)
   HPI  Tara Hopkins is a 67 y/o woman with a history of diabetes, peripheral neuropathy, stroke with left sided deficits, transmetatarsal amputation who is here today for evaluation of right wrist and hand pain. Her pain started since about 1 year ago and has never resolved since that time. She describes it as burning pain that hurts from the wrist to the tips of all fingers. This pain is worst at night. She also reports dropping things and having poor grip strength. She is wearing a wrist brace most days and especially at night for her symptoms. She is on gabapentin 400mg  twice daily for her lower extremity peripheral neuropathy.  CC: Right wrist pain   Medications/Interventions Tried: Wrist brace, gabapentin  See HPI and/or previous note for associated ROS.  Objective: BP (!) 162/64   Ht 5\' 5"  (1.651 m)   Wt 184 lb (83.5 kg)   BMI 30.62 kg/m  Gen: Right-Hand Dominant. NAD, normal affect.  CV: Well-perfused. Warm. Palpable pulses in both wrists with normal capillary refill Resp: Non-labored.  Neuro: Sensation intact throughout both hands Triceps and brachioradialis reflexes are 2+ bilaterally.  Gait: She is moving via wheelchair MSK Exam: Both wrists show no swelling or erythema. Phalen and Tinel tests are negative. There is no tenderness on direct palpation of either wrist or digits. Range of motion and grip strength, wrist flexion and extension, and finger abduction and adduction are all intact and symmetric.  Assessment and plan:  Right hand pain Her presentation is not entirely consistent with carpal tunnel syndrome although it remains a possibility. She is also at risk for pain from peripheral neuropathy although this is less consistent because it is unilateral. Median nerve cross section area is 0.11 cm^2 which is borderline for enlargement. She is already on conservative treatment with a splint and can continue this. Due to her multiple comorbidities empiric treatment with  steroids may not be ideal. I would defer to her PCP any empiric adjustment of neuropathy treatment. She would need nerve conduction study for definitive diagnosis due to equivocal findings.   No orders of the defined types were placed in this encounter.   No orders of the defined types were placed in this encounter.  Collier Salina, MD PGY-III Internal Medicine Resident 01/19/2018, 3:42 PM  Addendum:  Patient seen and examined with Resident, agree with his note and findings.    Right wrist: No deformity. FROM with 5/5 strength. No tenderness to palpation. NVI distally. Negative phalens and tinels.   Left wrist: No deformity. FROM with 5/5 strength. No tenderness to palpation. NVI distally. Negative phalens and tinels.  As he noted, her pain, exam, ultrasound are not clearly consistent with carpal tunnel.  Would expect with 1+ year of carpal tunnel her median nerve area to be much larger than 0.08 to 0.12 cm2 and for her phalens, tinels to be positive.  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  I advised she consider nerve conduction studies by neurology and/or increasing her gabapentin to three times a day but will defer to her PCP.  F/u with me as needed.

## 2018-01-19 NOTE — Assessment & Plan Note (Signed)
Her presentation is not entirely consistent with carpal tunnel syndrome although it remains a possibility. She is also at risk for pain from peripheral neuropathy although this is less consistent because it is unilateral. Median nerve cross section area is 0.11 cm^2 which is borderline for enlargement. She is already on conservative treatment with a splint and can continue this. Due to her multiple comorbidities empiric treatment with steroids may not be ideal. I would defer to her PCP any empiric adjustment of neuropathy treatment. She would need nerve conduction study for definitive diagnosis due to equivocal findings.

## 2018-01-20 ENCOUNTER — Telehealth: Payer: Self-pay | Admitting: Family Medicine

## 2018-01-20 NOTE — Telephone Encounter (Signed)
Copied from [place]. Topic: Quick Communication - See Telephone Encounter >> Jan 17, 2018  9:01 AM Burnis Medin, NT wrote: CRM for notification. See Telephone encounter for: [name] Pt said she doesn't need a health nurse and would like to stay with [facility].  Pt would like a call back.   01/17/18. >> Jan 20, 2018  4:31 PM Percell Belt A wrote: Pt stated she will take both the nursing and the aid if that is the only way she can get the aid

## 2018-01-21 NOTE — Telephone Encounter (Signed)
Pt has agreed to home health and Sherron Flemings will proceed with orders.  No further action required.

## 2018-01-23 ENCOUNTER — Encounter: Payer: Self-pay | Admitting: Family Medicine

## 2018-01-26 ENCOUNTER — Telehealth: Payer: Self-pay | Admitting: Adult Health

## 2018-01-26 NOTE — Telephone Encounter (Signed)
Copied from [place] [phone]. Topic: Quick Communication - See Telephone Encounter >> Jan 26, 2018 10:02 AM Ether Griffins B wrote: CRM for notification. See Telephone encounter for:  Marlowe Kays RN with [facility] calling needing verbal orders for home health aid, home care for nursing, also evaluations with OT for bathing and speech for some coughing when swallowing liquids. Also wanting to report a BP today of 174/88. Call back number is [phone].  01/26/18.

## 2018-01-26 NOTE — Telephone Encounter (Signed)
Informed Tara Hopkins to proceed with orders.  Spoke to her about bp reading.  This was at the pt's start of care visit.  Only seen the one time.

## 2018-01-26 NOTE — Progress Notes (Signed)
Spoke to patient about her recent visit with Sports Medicine. Dr. Benjamine Mola advised either increasing Gabapentin from BID to TID and/or nerve conduction studies. She does not want to do anything at this time. She will continue to wear here wrist splints.   Also received a note from home health today about her blood pressure being elevated. She reports that she had not taken her medication this morning. She took it when home health was there

## 2018-01-26 NOTE — Telephone Encounter (Signed)
OK for verbal orders.   I will call and discuss her BP

## 2018-02-02 ENCOUNTER — Ambulatory Visit: Payer: Medicare Other | Admitting: Nurse Practitioner

## 2018-02-02 ENCOUNTER — Telehealth: Payer: Self-pay | Admitting: Family Medicine

## 2018-02-02 ENCOUNTER — Other Ambulatory Visit: Payer: Self-pay | Admitting: Adult Health

## 2018-02-02 NOTE — Telephone Encounter (Signed)
Copied from [place] [phone]. Topic: Quick Communication - See Telephone Encounter >> Feb 02, 2018  3:55 PM Burnis Medin, NT wrote: CRM for notification. See Telephone encounter for: 02/02/18. Will is calling to get verbal orders for OT two times a week for 3 weeks. He would like a call back.

## 2018-02-03 NOTE — Telephone Encounter (Signed)
Sent to the pharmacy by e-scribe. 

## 2018-02-03 NOTE — Telephone Encounter (Signed)
Ok for verbal orders ?

## 2018-02-03 NOTE — Telephone Encounter (Signed)
Spoke to Will and informed him to proceed with orders.  No further action required.

## 2018-02-14 ENCOUNTER — Telehealth: Payer: Self-pay | Admitting: Adult Health

## 2018-02-14 NOTE — Telephone Encounter (Signed)
Copied from [place]. Topic: Quick Communication - See Telephone Encounter >> Feb 14, 2018 12:21 PM Percell Belt A wrote: CRM for notification. See Telephone encounter for: 02/14/18.- Marita [place] with [facility] called and stated she faxed of a form for pt Wheelchair with some corrections and she got the same form back.  The top sheet had the instructions. She is faxing it back over now.  Fax number [phone]

## 2018-02-22 NOTE — Telephone Encounter (Signed)
Left a message for [facility] to return my call.

## 2018-02-23 NOTE — Telephone Encounter (Signed)
Spoke to Cary at [facility] and corrections made.  Will have Cory sign and will then fax.  No further action required.

## 2018-03-03 ENCOUNTER — Other Ambulatory Visit: Payer: Self-pay | Admitting: Adult Health

## 2018-03-07 ENCOUNTER — Telehealth: Payer: Self-pay | Admitting: Adult Health

## 2018-03-07 DIAGNOSIS — N186 End stage renal disease: Secondary | ICD-10-CM | POA: Diagnosis not present

## 2018-03-07 DIAGNOSIS — D649 Anemia, unspecified: Secondary | ICD-10-CM | POA: Diagnosis not present

## 2018-03-07 DIAGNOSIS — I120 Hypertensive chronic kidney disease with stage 5 chronic kidney disease or end stage renal disease: Secondary | ICD-10-CM | POA: Diagnosis not present

## 2018-03-07 DIAGNOSIS — R2689 Other abnormalities of gait and mobility: Secondary | ICD-10-CM | POA: Diagnosis not present

## 2018-03-07 DIAGNOSIS — E1122 Type 2 diabetes mellitus with diabetic chronic kidney disease: Secondary | ICD-10-CM | POA: Diagnosis not present

## 2018-03-07 DIAGNOSIS — E1142 Type 2 diabetes mellitus with diabetic polyneuropathy: Secondary | ICD-10-CM | POA: Diagnosis not present

## 2018-03-07 NOTE — Telephone Encounter (Signed)
Copied from [place] [phone]. Topic: Quick Communication - See Telephone Encounter >> Mar 07, 2018  3:40 PM Neva Seat wrote: Marlowe Kays - [facility] [phone]  Verbal orders:  Restart home care -skilled nursing, pt in home health aid [place] is faxing : Power wheelchair - updated 7 element form

## 2018-03-08 DIAGNOSIS — N186 End stage renal disease: Secondary | ICD-10-CM | POA: Diagnosis not present

## 2018-03-08 DIAGNOSIS — R2689 Other abnormalities of gait and mobility: Secondary | ICD-10-CM | POA: Diagnosis not present

## 2018-03-08 DIAGNOSIS — E1142 Type 2 diabetes mellitus with diabetic polyneuropathy: Secondary | ICD-10-CM | POA: Diagnosis not present

## 2018-03-08 DIAGNOSIS — D649 Anemia, unspecified: Secondary | ICD-10-CM | POA: Diagnosis not present

## 2018-03-08 DIAGNOSIS — E1122 Type 2 diabetes mellitus with diabetic chronic kidney disease: Secondary | ICD-10-CM | POA: Diagnosis not present

## 2018-03-08 DIAGNOSIS — I120 Hypertensive chronic kidney disease with stage 5 chronic kidney disease or end stage renal disease: Secondary | ICD-10-CM | POA: Diagnosis not present

## 2018-03-08 NOTE — Telephone Encounter (Signed)
Left a message to proceed with orders to re start home care.  Advised a call back if any questions.

## 2018-03-09 ENCOUNTER — Telehealth: Payer: Self-pay | Admitting: Adult Health

## 2018-03-09 DIAGNOSIS — I120 Hypertensive chronic kidney disease with stage 5 chronic kidney disease or end stage renal disease: Secondary | ICD-10-CM | POA: Diagnosis not present

## 2018-03-09 DIAGNOSIS — E1122 Type 2 diabetes mellitus with diabetic chronic kidney disease: Secondary | ICD-10-CM | POA: Diagnosis not present

## 2018-03-09 DIAGNOSIS — D649 Anemia, unspecified: Secondary | ICD-10-CM | POA: Diagnosis not present

## 2018-03-09 DIAGNOSIS — R2689 Other abnormalities of gait and mobility: Secondary | ICD-10-CM | POA: Diagnosis not present

## 2018-03-09 DIAGNOSIS — N186 End stage renal disease: Secondary | ICD-10-CM | POA: Diagnosis not present

## 2018-03-09 DIAGNOSIS — E1142 Type 2 diabetes mellitus with diabetic polyneuropathy: Secondary | ICD-10-CM | POA: Diagnosis not present

## 2018-03-09 NOTE — Telephone Encounter (Signed)
Copied from [place] [phone]. Topic: Quick Communication - See Telephone Encounter >> Mar 09, 2018  2:00 PM Ivar Drape wrote: CRM for notification. See Telephone encounter for: 03/09/18. [place] PT w/[facility] [phone] need a verbal okay to see the patient twice a week for 7 weeks for [facility] PT services.

## 2018-03-09 NOTE — Telephone Encounter (Signed)
Spoke to [place] and advised ok to proceed with PT.  No further action required.

## 2018-03-10 DIAGNOSIS — E1122 Type 2 diabetes mellitus with diabetic chronic kidney disease: Secondary | ICD-10-CM | POA: Diagnosis not present

## 2018-03-10 DIAGNOSIS — D649 Anemia, unspecified: Secondary | ICD-10-CM | POA: Diagnosis not present

## 2018-03-10 DIAGNOSIS — N186 End stage renal disease: Secondary | ICD-10-CM | POA: Diagnosis not present

## 2018-03-10 DIAGNOSIS — R2689 Other abnormalities of gait and mobility: Secondary | ICD-10-CM | POA: Diagnosis not present

## 2018-03-10 DIAGNOSIS — E1142 Type 2 diabetes mellitus with diabetic polyneuropathy: Secondary | ICD-10-CM | POA: Diagnosis not present

## 2018-03-10 DIAGNOSIS — I120 Hypertensive chronic kidney disease with stage 5 chronic kidney disease or end stage renal disease: Secondary | ICD-10-CM | POA: Diagnosis not present

## 2018-03-11 DIAGNOSIS — D649 Anemia, unspecified: Secondary | ICD-10-CM | POA: Diagnosis not present

## 2018-03-11 DIAGNOSIS — N186 End stage renal disease: Secondary | ICD-10-CM | POA: Diagnosis not present

## 2018-03-11 DIAGNOSIS — I120 Hypertensive chronic kidney disease with stage 5 chronic kidney disease or end stage renal disease: Secondary | ICD-10-CM | POA: Diagnosis not present

## 2018-03-11 DIAGNOSIS — R2689 Other abnormalities of gait and mobility: Secondary | ICD-10-CM | POA: Diagnosis not present

## 2018-03-11 DIAGNOSIS — E1142 Type 2 diabetes mellitus with diabetic polyneuropathy: Secondary | ICD-10-CM | POA: Diagnosis not present

## 2018-03-11 DIAGNOSIS — E1122 Type 2 diabetes mellitus with diabetic chronic kidney disease: Secondary | ICD-10-CM | POA: Diagnosis not present

## 2018-03-14 DIAGNOSIS — R2689 Other abnormalities of gait and mobility: Secondary | ICD-10-CM | POA: Diagnosis not present

## 2018-03-14 DIAGNOSIS — E1142 Type 2 diabetes mellitus with diabetic polyneuropathy: Secondary | ICD-10-CM | POA: Diagnosis not present

## 2018-03-14 DIAGNOSIS — E1122 Type 2 diabetes mellitus with diabetic chronic kidney disease: Secondary | ICD-10-CM | POA: Diagnosis not present

## 2018-03-14 DIAGNOSIS — I120 Hypertensive chronic kidney disease with stage 5 chronic kidney disease or end stage renal disease: Secondary | ICD-10-CM | POA: Diagnosis not present

## 2018-03-14 DIAGNOSIS — D649 Anemia, unspecified: Secondary | ICD-10-CM | POA: Diagnosis not present

## 2018-03-14 DIAGNOSIS — N186 End stage renal disease: Secondary | ICD-10-CM | POA: Diagnosis not present

## 2018-03-15 ENCOUNTER — Ambulatory Visit: Payer: Medicare Other | Admitting: Nurse Practitioner

## 2018-03-15 DIAGNOSIS — N186 End stage renal disease: Secondary | ICD-10-CM | POA: Diagnosis not present

## 2018-03-15 DIAGNOSIS — E1122 Type 2 diabetes mellitus with diabetic chronic kidney disease: Secondary | ICD-10-CM | POA: Diagnosis not present

## 2018-03-15 DIAGNOSIS — D649 Anemia, unspecified: Secondary | ICD-10-CM | POA: Diagnosis not present

## 2018-03-15 DIAGNOSIS — E1142 Type 2 diabetes mellitus with diabetic polyneuropathy: Secondary | ICD-10-CM | POA: Diagnosis not present

## 2018-03-15 DIAGNOSIS — I120 Hypertensive chronic kidney disease with stage 5 chronic kidney disease or end stage renal disease: Secondary | ICD-10-CM | POA: Diagnosis not present

## 2018-03-15 DIAGNOSIS — R2689 Other abnormalities of gait and mobility: Secondary | ICD-10-CM | POA: Diagnosis not present

## 2018-03-17 DIAGNOSIS — N186 End stage renal disease: Secondary | ICD-10-CM | POA: Diagnosis not present

## 2018-03-17 DIAGNOSIS — R2689 Other abnormalities of gait and mobility: Secondary | ICD-10-CM | POA: Diagnosis not present

## 2018-03-17 DIAGNOSIS — I120 Hypertensive chronic kidney disease with stage 5 chronic kidney disease or end stage renal disease: Secondary | ICD-10-CM | POA: Diagnosis not present

## 2018-03-17 DIAGNOSIS — E1122 Type 2 diabetes mellitus with diabetic chronic kidney disease: Secondary | ICD-10-CM | POA: Diagnosis not present

## 2018-03-17 DIAGNOSIS — D649 Anemia, unspecified: Secondary | ICD-10-CM | POA: Diagnosis not present

## 2018-03-17 DIAGNOSIS — E1142 Type 2 diabetes mellitus with diabetic polyneuropathy: Secondary | ICD-10-CM | POA: Diagnosis not present

## 2018-03-21 DIAGNOSIS — N186 End stage renal disease: Secondary | ICD-10-CM | POA: Diagnosis not present

## 2018-03-21 DIAGNOSIS — R2689 Other abnormalities of gait and mobility: Secondary | ICD-10-CM | POA: Diagnosis not present

## 2018-03-21 DIAGNOSIS — E1142 Type 2 diabetes mellitus with diabetic polyneuropathy: Secondary | ICD-10-CM | POA: Diagnosis not present

## 2018-03-21 DIAGNOSIS — E1122 Type 2 diabetes mellitus with diabetic chronic kidney disease: Secondary | ICD-10-CM | POA: Diagnosis not present

## 2018-03-21 DIAGNOSIS — I120 Hypertensive chronic kidney disease with stage 5 chronic kidney disease or end stage renal disease: Secondary | ICD-10-CM | POA: Diagnosis not present

## 2018-03-21 DIAGNOSIS — D649 Anemia, unspecified: Secondary | ICD-10-CM | POA: Diagnosis not present

## 2018-03-22 ENCOUNTER — Ambulatory Visit: Payer: Self-pay | Admitting: Adult Health

## 2018-03-22 DIAGNOSIS — N186 End stage renal disease: Secondary | ICD-10-CM | POA: Diagnosis not present

## 2018-03-22 DIAGNOSIS — E1142 Type 2 diabetes mellitus with diabetic polyneuropathy: Secondary | ICD-10-CM | POA: Diagnosis not present

## 2018-03-22 DIAGNOSIS — D649 Anemia, unspecified: Secondary | ICD-10-CM | POA: Diagnosis not present

## 2018-03-22 DIAGNOSIS — E1122 Type 2 diabetes mellitus with diabetic chronic kidney disease: Secondary | ICD-10-CM | POA: Diagnosis not present

## 2018-03-22 DIAGNOSIS — R2689 Other abnormalities of gait and mobility: Secondary | ICD-10-CM | POA: Diagnosis not present

## 2018-03-22 DIAGNOSIS — I120 Hypertensive chronic kidney disease with stage 5 chronic kidney disease or end stage renal disease: Secondary | ICD-10-CM | POA: Diagnosis not present

## 2018-03-24 DIAGNOSIS — R2689 Other abnormalities of gait and mobility: Secondary | ICD-10-CM | POA: Diagnosis not present

## 2018-03-24 DIAGNOSIS — D649 Anemia, unspecified: Secondary | ICD-10-CM | POA: Diagnosis not present

## 2018-03-24 DIAGNOSIS — E1142 Type 2 diabetes mellitus with diabetic polyneuropathy: Secondary | ICD-10-CM | POA: Diagnosis not present

## 2018-03-24 DIAGNOSIS — E1122 Type 2 diabetes mellitus with diabetic chronic kidney disease: Secondary | ICD-10-CM | POA: Diagnosis not present

## 2018-03-24 DIAGNOSIS — N186 End stage renal disease: Secondary | ICD-10-CM | POA: Diagnosis not present

## 2018-03-24 DIAGNOSIS — I120 Hypertensive chronic kidney disease with stage 5 chronic kidney disease or end stage renal disease: Secondary | ICD-10-CM | POA: Diagnosis not present

## 2018-03-25 DIAGNOSIS — E1122 Type 2 diabetes mellitus with diabetic chronic kidney disease: Secondary | ICD-10-CM | POA: Diagnosis not present

## 2018-03-25 DIAGNOSIS — R2689 Other abnormalities of gait and mobility: Secondary | ICD-10-CM | POA: Diagnosis not present

## 2018-03-25 DIAGNOSIS — I120 Hypertensive chronic kidney disease with stage 5 chronic kidney disease or end stage renal disease: Secondary | ICD-10-CM | POA: Diagnosis not present

## 2018-03-25 DIAGNOSIS — E1142 Type 2 diabetes mellitus with diabetic polyneuropathy: Secondary | ICD-10-CM | POA: Diagnosis not present

## 2018-03-25 DIAGNOSIS — D649 Anemia, unspecified: Secondary | ICD-10-CM | POA: Diagnosis not present

## 2018-03-25 DIAGNOSIS — N186 End stage renal disease: Secondary | ICD-10-CM | POA: Diagnosis not present

## 2018-03-28 DIAGNOSIS — I120 Hypertensive chronic kidney disease with stage 5 chronic kidney disease or end stage renal disease: Secondary | ICD-10-CM | POA: Diagnosis not present

## 2018-03-28 DIAGNOSIS — R2689 Other abnormalities of gait and mobility: Secondary | ICD-10-CM | POA: Diagnosis not present

## 2018-03-28 DIAGNOSIS — E1122 Type 2 diabetes mellitus with diabetic chronic kidney disease: Secondary | ICD-10-CM | POA: Diagnosis not present

## 2018-03-28 DIAGNOSIS — E1142 Type 2 diabetes mellitus with diabetic polyneuropathy: Secondary | ICD-10-CM | POA: Diagnosis not present

## 2018-03-28 DIAGNOSIS — N186 End stage renal disease: Secondary | ICD-10-CM | POA: Diagnosis not present

## 2018-03-28 DIAGNOSIS — D649 Anemia, unspecified: Secondary | ICD-10-CM | POA: Diagnosis not present

## 2018-03-29 ENCOUNTER — Ambulatory Visit (INDEPENDENT_AMBULATORY_CARE_PROVIDER_SITE_OTHER): Payer: Medicare Other | Admitting: Adult Health

## 2018-03-29 ENCOUNTER — Encounter: Payer: Self-pay | Admitting: Adult Health

## 2018-03-29 ENCOUNTER — Other Ambulatory Visit: Payer: Self-pay | Admitting: Adult Health

## 2018-03-29 VITALS — BP 170/86 | Temp 98.3°F | Wt 162.0 lb

## 2018-03-29 DIAGNOSIS — R2689 Other abnormalities of gait and mobility: Secondary | ICD-10-CM | POA: Diagnosis not present

## 2018-03-29 DIAGNOSIS — I10 Essential (primary) hypertension: Secondary | ICD-10-CM

## 2018-03-29 DIAGNOSIS — E1122 Type 2 diabetes mellitus with diabetic chronic kidney disease: Secondary | ICD-10-CM | POA: Diagnosis not present

## 2018-03-29 DIAGNOSIS — E1142 Type 2 diabetes mellitus with diabetic polyneuropathy: Secondary | ICD-10-CM

## 2018-03-29 DIAGNOSIS — D649 Anemia, unspecified: Secondary | ICD-10-CM | POA: Diagnosis not present

## 2018-03-29 DIAGNOSIS — F329 Major depressive disorder, single episode, unspecified: Secondary | ICD-10-CM

## 2018-03-29 DIAGNOSIS — I120 Hypertensive chronic kidney disease with stage 5 chronic kidney disease or end stage renal disease: Secondary | ICD-10-CM | POA: Diagnosis not present

## 2018-03-29 DIAGNOSIS — F32A Depression, unspecified: Secondary | ICD-10-CM

## 2018-03-29 DIAGNOSIS — N186 End stage renal disease: Secondary | ICD-10-CM | POA: Diagnosis not present

## 2018-03-29 LAB — POCT GLYCOSYLATED HEMOGLOBIN (HGB A1C): Hemoglobin A1C: 6.7 % — AB (ref 4.0–5.6)

## 2018-03-29 MED ORDER — GABAPENTIN 400 MG PO CAPS: ORAL_CAPSULE | ORAL | 2 refills | Status: DC

## 2018-03-29 MED ORDER — FLUOXETINE HCL 20 MG PO TABS: 40.0000 mg | ORAL_TABLET | Freq: Every day | ORAL | 0 refills | Status: DC

## 2018-03-29 MED ORDER — LISINOPRIL 40 MG PO TABS: 40.0000 mg | ORAL_TABLET | Freq: Every day | ORAL | 0 refills | Status: DC

## 2018-03-29 MED ORDER — ATORVASTATIN CALCIUM 40 MG PO TABS: 40.0000 mg | ORAL_TABLET | Freq: Every day | ORAL | 0 refills | Status: DC

## 2018-03-29 MED ORDER — GLIMEPIRIDE 4 MG PO TABS: 4.0000 mg | ORAL_TABLET | Freq: Every day | ORAL | 0 refills | Status: DC

## 2018-03-29 NOTE — Progress Notes (Signed)
Subjective:    Patient ID: Tara Hopkins, female    DOB: 1951-03-16, 67 y.o.   MRN: [id number]  HPI   67 year old female who  has a past medical history of Anemia, Anxiety, Bipolar 1 disorder ([place]), Cataracts, bilateral, Depression, Diabetes mellitus without complication ([place]), Glaucoma, History of blood transfusion, History of hyperbaric oxygen therapy, Hypercholesteremia, Hypertension, Peripheral neuropathy, and Stroke ([place]) (02/2015).  She presents to the office today for follow up regarding depression, hypertension, and DM.   DM -currently controlled with Lantus 25 units at bedtime and Amaryl 4 mg daily.  She reports being compliant with her medications on a daily basis and has been working on weight loss through heart healthy eating.  She has given up any sugary drinks and has been drinking a lot more water.  These lifestyle modification she is been able to lose approximately 20 pounds.  She does report feeling "a lot better than I did last year".  Previous A1c was 6.8  Wt Readings from Last 3 Encounters:  03/29/18 162 lb (73.5 kg)  01/19/18 184 lb (83.5 kg)  12/16/17 184 lb (83.5 kg)   Depression -only taking Prozac 40 mg daily and Klonopin 0.5 mg 3 times a day.  Overall she states that she feels much better, she is no longer feeling depressed.  Factors that contribute to this include finalizing a divorce and starting a new relationship with the daughter that she was not talking to previously.  She denies any suicidal ideation.   Hypertension -currently prescribed lisinopril 20 mg.  He does report that she is taking her medications on a daily basis.  Denies any headaches, blurred vision, lightheadedness, or dizziness.  BP Readings from Last 3 Encounters:  03/29/18 (!) 170/86  01/19/18 (!) 162/64  12/16/17 120/64   Review of Systems See HPI   Past Medical History:  Diagnosis Date  . Anemia   . Anxiety   . Bipolar 1 disorder ([place])   . Cataracts, bilateral   . Depression     . Diabetes mellitus without complication ([place])   . Glaucoma   . History of blood transfusion   . History of hyperbaric oxygen therapy    pt reports 80 treatments.  . Hypercholesteremia   . Hypertension   . Peripheral neuropathy   . Stroke [facility]) 02/2015    Social History   Socioeconomic History  . Marital status: Married    Spouse name: Not on file  . Number of children: 3  . Years of education: 5  . Highest education level: Not on file  Occupational History  . Occupation: Disbaled  Social Needs  . Financial resource strain: Not on file  . Food insecurity:    Worry: Not on file    Inability: Not on file  . Transportation needs:    Medical: Not on file    Non-medical: Not on file  Tobacco Use  . Smoking status: Former Smoker    Packs/day: 0.01    Years: 20.00    Pack years: 0.20    Types: Cigarettes    Last attempt to quit: 09/26/2017    Years since quitting: 0.5  . Smokeless tobacco: Never Used  Substance and Sexual Activity  . Alcohol use: Yes    Alcohol/week: 0.0 oz    Comment: beer every 3 months  . Drug use: No  . Sexual activity: Not on file  Lifestyle  . Physical activity:    Days per week: Not on file  Minutes per session: Not on file  . Stress: Not on file  Relationships  . Social connections:    Talks on phone: Not on file    Gets together: Not on file    Attends religious service: Not on file    Active member of club or organization: Not on file    Attends meetings of clubs or organizations: Not on file    Relationship status: Not on file  . Intimate partner violence:    Fear of current or ex partner: Not on file    Emotionally abused: Not on file    Physically abused: Not on file    Forced sexual activity: Not on file  Other Topics Concern  . Not on file  Social History Narrative   Lives at home alone.   Right-handed.   No caffeine use.   Oldest of 12 children     Past Surgical History:  Procedure Laterality Date  . AMPUTATION  Right 12/15/2013   Procedure: Right Transmetatarsal Amputation;  Surgeon: Newt Minion, MD;  Location: [facility];  Service: Orthopedics;  Laterality: Right;  . AMPUTATION Right 02/23/2014   Procedure: AMPUTATION FOOT;  Surgeon: Newt Minion, MD;  Location: [facility];  Service: Orthopedics;  Laterality: Right;  Right Foot Revision Transmetatarsal Amputation  . I&D EXTREMITY Right 05/23/2013   Procedure: IRRIGATION AND DEBRIDEMENT RIGHT GREAT TOE;  Surgeon: Mauri Pole, MD;  Location: [facility];  Service: Orthopedics;  Laterality: Right;    Family History  Problem Relation Age of Onset  . Liver cancer Mother   . Lung cancer Mother   . Depression Mother   . Hypertension Father   . Depression Father   . Colon cancer Neg Hx     No Known Allergies  Current Outpatient Medications on File Prior to Visit  Medication Sig Dispense Refill  . ALPRAZolam (XANAX) 0.25 MG tablet Take by mouth.    Marland Kitchen aspirin 325 MG EC tablet Take 1 tablet (325 mg total) by mouth daily. 30 tablet 0  . clonazePAM (KLONOPIN) 0.5 MG tablet TAKE 1 TABLET BY MOUTH THREE TIMES A DAY 90 tablet 0  . COD LIVER OIL PO Take 1 mg by mouth daily.    . collagenase (SANTYL) ointment Apply topically.    Marland Kitchen glucose blood (ONETOUCH VERIO) test strip USE TO TEST BLOOD GLUCOSE 3 TIMES DAILY 100 each 12  . insulin glargine (LANTUS) 100 unit/mL SOPN Inject 0.25 mLs (25 Units total) into the skin at bedtime. 15 mL 3  . Insulin Pen Needle (B-D UF III MINI PEN NEEDLES) 31G X 5 MM MISC Use as directed. 100 each 3  . latanoprost (XALATAN) 0.005 % ophthalmic solution Place 1 drop into both eyes at bedtime. 2.5 mL 12  . Lurasidone HCl 60 MG TABS Take by mouth.    . ONE TOUCH LANCETS MISC USE TO CHECK BLOOD SUGAR 3 TIMES DAILY 200 each 3  . trolamine salicylate (ASPERCREME) 10 % cream Apply topically 2 (two) times daily as needed for muscle pain. 85 g 0  . zinc gluconate 50 MG tablet Take by mouth.     No current facility-administered medications on file  prior to visit.     BP (!) 170/86   Temp 98.3 F (36.8 C) (Oral)   Wt 162 lb (73.5 kg)   BMI 26.96 kg/m       Objective:   Physical Exam  Constitutional: She is oriented to person, place, and time. She appears well-developed and  well-nourished. No distress.  Cardiovascular: Normal rate, regular rhythm, normal heart sounds and intact distal pulses. Exam reveals no gallop and no friction rub.  No murmur heard. Pulmonary/Chest: Effort normal and breath sounds normal. No stridor. No respiratory distress. She has no wheezes. She has no rales. She exhibits no tenderness.  Musculoskeletal:  Sitting in wheelchair  Neurological: She is alert and oriented to person, place, and time. Coordination abnormal.  Left-sided weakness status post stroke  Skin: Skin is warm and dry. Capillary refill takes less than 2 seconds. She is not diaphoretic.  Psychiatric: She has a normal mood and affect. Her behavior is normal. Judgment and thought content normal.  Nursing note and vitals reviewed.     Assessment & Plan:  1. Depression, unspecified depression type PHQ 9 score 0 -Continue with current plan of care and follow-up as needed - clonazePAM (KLONOPIN) 0.5 MG tablet; Take 1 tablet (0.5 mg total) by mouth 3 (three) times daily.  Dispense: 90 tablet; Refill: 2  2. DM type 2 with diabetic peripheral neuropathy (HCC)  - POCT A1C-X.7.  Has improved.  Will continue on current regimen with follow-up in 3 months - insulin glargine (LANTUS) 100 unit/mL SOPN; Inject 0.25 mLs (25 Units total) into the skin at bedtime.  Dispense: 15 mL; Refill: 3  3. Essential hypertension -Not at goal.  Will increase lisinopril from 20 mg to 40 mg.  Follow-up in 2 to 3 weeks - lisinopril (PRINIVIL,ZESTRIL) 40 MG tablet; Take 1 tablet (40 mg total) by mouth daily.  Dispense: 90 tablet; Refill: 0  [name], NP

## 2018-03-29 NOTE — Patient Instructions (Signed)
It was great seeing you today and I am so happy you are feeling better.   Your A1c is 6.7 - this has improved   I am going to increase Lisinopril from 20 mg to 40 mg   Please follow up in October for your physical

## 2018-03-29 NOTE — Telephone Encounter (Signed)
Sent to the pharmacy by e-scribe. 

## 2018-03-30 ENCOUNTER — Encounter: Payer: Self-pay | Admitting: Adult Health

## 2018-03-30 DIAGNOSIS — E1142 Type 2 diabetes mellitus with diabetic polyneuropathy: Secondary | ICD-10-CM | POA: Diagnosis not present

## 2018-03-30 DIAGNOSIS — E1122 Type 2 diabetes mellitus with diabetic chronic kidney disease: Secondary | ICD-10-CM | POA: Diagnosis not present

## 2018-03-30 DIAGNOSIS — D649 Anemia, unspecified: Secondary | ICD-10-CM | POA: Diagnosis not present

## 2018-03-30 DIAGNOSIS — N186 End stage renal disease: Secondary | ICD-10-CM | POA: Diagnosis not present

## 2018-03-30 DIAGNOSIS — R2689 Other abnormalities of gait and mobility: Secondary | ICD-10-CM | POA: Diagnosis not present

## 2018-03-30 DIAGNOSIS — I120 Hypertensive chronic kidney disease with stage 5 chronic kidney disease or end stage renal disease: Secondary | ICD-10-CM | POA: Diagnosis not present

## 2018-03-30 MED ORDER — INSULIN GLARGINE 100 UNITS/ML SOLOSTAR PEN: 25.0000 [IU] | PEN_INJECTOR | Freq: Every day | SUBCUTANEOUS | 0 refills | Status: DC

## 2018-03-30 MED ORDER — INSULIN GLARGINE 100 UNITS/ML SOLOSTAR PEN: 25.0000 [IU] | PEN_INJECTOR | Freq: Every day | SUBCUTANEOUS | 3 refills | Status: DC

## 2018-03-30 MED ORDER — CLONAZEPAM 0.5 MG PO TABS: 0.5000 mg | ORAL_TABLET | Freq: Three times a day (TID) | ORAL | 2 refills | Status: DC

## 2018-03-30 NOTE — Addendum Note (Signed)
Addended by: Miles Costain T on: 03/30/2018 07:42 AM   Modules accepted: Orders

## 2018-04-01 DIAGNOSIS — N186 End stage renal disease: Secondary | ICD-10-CM | POA: Diagnosis not present

## 2018-04-01 DIAGNOSIS — E1122 Type 2 diabetes mellitus with diabetic chronic kidney disease: Secondary | ICD-10-CM | POA: Diagnosis not present

## 2018-04-01 DIAGNOSIS — R2689 Other abnormalities of gait and mobility: Secondary | ICD-10-CM | POA: Diagnosis not present

## 2018-04-01 DIAGNOSIS — I120 Hypertensive chronic kidney disease with stage 5 chronic kidney disease or end stage renal disease: Secondary | ICD-10-CM | POA: Diagnosis not present

## 2018-04-01 DIAGNOSIS — E1142 Type 2 diabetes mellitus with diabetic polyneuropathy: Secondary | ICD-10-CM | POA: Diagnosis not present

## 2018-04-01 DIAGNOSIS — D649 Anemia, unspecified: Secondary | ICD-10-CM | POA: Diagnosis not present

## 2018-04-06 DIAGNOSIS — E1142 Type 2 diabetes mellitus with diabetic polyneuropathy: Secondary | ICD-10-CM | POA: Diagnosis not present

## 2018-04-06 DIAGNOSIS — N186 End stage renal disease: Secondary | ICD-10-CM | POA: Diagnosis not present

## 2018-04-06 DIAGNOSIS — R2689 Other abnormalities of gait and mobility: Secondary | ICD-10-CM | POA: Diagnosis not present

## 2018-04-06 DIAGNOSIS — I120 Hypertensive chronic kidney disease with stage 5 chronic kidney disease or end stage renal disease: Secondary | ICD-10-CM | POA: Diagnosis not present

## 2018-04-06 DIAGNOSIS — E1122 Type 2 diabetes mellitus with diabetic chronic kidney disease: Secondary | ICD-10-CM | POA: Diagnosis not present

## 2018-04-06 DIAGNOSIS — D649 Anemia, unspecified: Secondary | ICD-10-CM | POA: Diagnosis not present

## 2018-04-07 ENCOUNTER — Telehealth: Payer: Self-pay | Admitting: Adult Health

## 2018-04-07 DIAGNOSIS — I120 Hypertensive chronic kidney disease with stage 5 chronic kidney disease or end stage renal disease: Secondary | ICD-10-CM | POA: Diagnosis not present

## 2018-04-07 DIAGNOSIS — N186 End stage renal disease: Secondary | ICD-10-CM | POA: Diagnosis not present

## 2018-04-07 DIAGNOSIS — R2689 Other abnormalities of gait and mobility: Secondary | ICD-10-CM | POA: Diagnosis not present

## 2018-04-07 DIAGNOSIS — E1142 Type 2 diabetes mellitus with diabetic polyneuropathy: Secondary | ICD-10-CM | POA: Diagnosis not present

## 2018-04-07 DIAGNOSIS — D649 Anemia, unspecified: Secondary | ICD-10-CM | POA: Diagnosis not present

## 2018-04-07 DIAGNOSIS — E1122 Type 2 diabetes mellitus with diabetic chronic kidney disease: Secondary | ICD-10-CM | POA: Diagnosis not present

## 2018-04-07 NOTE — Telephone Encounter (Signed)
Copied from [place] [phone]. Topic: Quick Communication - See Telephone Encounter >> Apr 07, 2018  4:05 PM Boyd Kerbs wrote: CRM for notification. See Telephone encounter for: 04/07/18.  Pt is asking for Home Health care.  [facility] will need an assessment and order for this

## 2018-04-08 ENCOUNTER — Telehealth: Payer: Self-pay | Admitting: Family Medicine

## 2018-04-08 DIAGNOSIS — E1142 Type 2 diabetes mellitus with diabetic polyneuropathy: Secondary | ICD-10-CM | POA: Diagnosis not present

## 2018-04-08 DIAGNOSIS — E1122 Type 2 diabetes mellitus with diabetic chronic kidney disease: Secondary | ICD-10-CM | POA: Diagnosis not present

## 2018-04-08 DIAGNOSIS — I120 Hypertensive chronic kidney disease with stage 5 chronic kidney disease or end stage renal disease: Secondary | ICD-10-CM | POA: Diagnosis not present

## 2018-04-08 DIAGNOSIS — N186 End stage renal disease: Secondary | ICD-10-CM | POA: Diagnosis not present

## 2018-04-08 DIAGNOSIS — D649 Anemia, unspecified: Secondary | ICD-10-CM | POA: Diagnosis not present

## 2018-04-08 DIAGNOSIS — R2689 Other abnormalities of gait and mobility: Secondary | ICD-10-CM | POA: Diagnosis not present

## 2018-04-08 NOTE — Telephone Encounter (Signed)
Left a message for a return call.

## 2018-04-08 NOTE — Telephone Encounter (Signed)
Copied from [place] [phone]. Topic: Quick Communication - See Telephone Encounter >> Apr 08, 2018 11:16 AM Hewitt Shorts wrote: Pt is requesting that Dr. Tommi Rumps assistant give her a call she would not give me details of the message just that she would like a call to [phone]

## 2018-04-12 DIAGNOSIS — D649 Anemia, unspecified: Secondary | ICD-10-CM | POA: Diagnosis not present

## 2018-04-12 DIAGNOSIS — I120 Hypertensive chronic kidney disease with stage 5 chronic kidney disease or end stage renal disease: Secondary | ICD-10-CM | POA: Diagnosis not present

## 2018-04-12 DIAGNOSIS — R2689 Other abnormalities of gait and mobility: Secondary | ICD-10-CM | POA: Diagnosis not present

## 2018-04-12 DIAGNOSIS — E1122 Type 2 diabetes mellitus with diabetic chronic kidney disease: Secondary | ICD-10-CM | POA: Diagnosis not present

## 2018-04-12 DIAGNOSIS — E1142 Type 2 diabetes mellitus with diabetic polyneuropathy: Secondary | ICD-10-CM | POA: Diagnosis not present

## 2018-04-12 DIAGNOSIS — N186 End stage renal disease: Secondary | ICD-10-CM | POA: Diagnosis not present

## 2018-04-13 NOTE — Telephone Encounter (Signed)
Spoke to the pt.  She will stay with [facility].  No further action required.

## 2018-04-13 NOTE — Telephone Encounter (Signed)
Spoke to the pt.  She is seeking services from [facility].  She is looking for someone to help her with bathing, cooking, grocery shopping etc.  Will speak with [facility] to see if they offer services like this before transferring care.

## 2018-04-14 DIAGNOSIS — E1122 Type 2 diabetes mellitus with diabetic chronic kidney disease: Secondary | ICD-10-CM | POA: Diagnosis not present

## 2018-04-14 DIAGNOSIS — N186 End stage renal disease: Secondary | ICD-10-CM | POA: Diagnosis not present

## 2018-04-14 DIAGNOSIS — E1142 Type 2 diabetes mellitus with diabetic polyneuropathy: Secondary | ICD-10-CM | POA: Diagnosis not present

## 2018-04-14 DIAGNOSIS — D649 Anemia, unspecified: Secondary | ICD-10-CM | POA: Diagnosis not present

## 2018-04-14 DIAGNOSIS — I120 Hypertensive chronic kidney disease with stage 5 chronic kidney disease or end stage renal disease: Secondary | ICD-10-CM | POA: Diagnosis not present

## 2018-04-14 DIAGNOSIS — R2689 Other abnormalities of gait and mobility: Secondary | ICD-10-CM | POA: Diagnosis not present

## 2018-04-15 DIAGNOSIS — E1122 Type 2 diabetes mellitus with diabetic chronic kidney disease: Secondary | ICD-10-CM | POA: Diagnosis not present

## 2018-04-15 DIAGNOSIS — I120 Hypertensive chronic kidney disease with stage 5 chronic kidney disease or end stage renal disease: Secondary | ICD-10-CM | POA: Diagnosis not present

## 2018-04-15 DIAGNOSIS — R2689 Other abnormalities of gait and mobility: Secondary | ICD-10-CM | POA: Diagnosis not present

## 2018-04-15 DIAGNOSIS — N186 End stage renal disease: Secondary | ICD-10-CM | POA: Diagnosis not present

## 2018-04-15 DIAGNOSIS — D649 Anemia, unspecified: Secondary | ICD-10-CM | POA: Diagnosis not present

## 2018-04-15 DIAGNOSIS — E1142 Type 2 diabetes mellitus with diabetic polyneuropathy: Secondary | ICD-10-CM | POA: Diagnosis not present

## 2018-04-15 NOTE — Telephone Encounter (Signed)
Called and spoke to Tara Hopkins.  They do not need any information from the provider.  Pt needs only to call and speak to a representative.  Called and notified pt.  No further action required.  Will close note.

## 2018-04-20 DIAGNOSIS — I120 Hypertensive chronic kidney disease with stage 5 chronic kidney disease or end stage renal disease: Secondary | ICD-10-CM | POA: Diagnosis not present

## 2018-04-20 DIAGNOSIS — E1122 Type 2 diabetes mellitus with diabetic chronic kidney disease: Secondary | ICD-10-CM | POA: Diagnosis not present

## 2018-04-20 DIAGNOSIS — R2689 Other abnormalities of gait and mobility: Secondary | ICD-10-CM | POA: Diagnosis not present

## 2018-04-20 DIAGNOSIS — D649 Anemia, unspecified: Secondary | ICD-10-CM | POA: Diagnosis not present

## 2018-04-20 DIAGNOSIS — E1142 Type 2 diabetes mellitus with diabetic polyneuropathy: Secondary | ICD-10-CM | POA: Diagnosis not present

## 2018-04-20 DIAGNOSIS — N186 End stage renal disease: Secondary | ICD-10-CM | POA: Diagnosis not present

## 2018-04-22 ENCOUNTER — Telehealth: Payer: Self-pay | Admitting: Adult Health

## 2018-04-22 DIAGNOSIS — N186 End stage renal disease: Secondary | ICD-10-CM | POA: Diagnosis not present

## 2018-04-22 DIAGNOSIS — E1142 Type 2 diabetes mellitus with diabetic polyneuropathy: Secondary | ICD-10-CM | POA: Diagnosis not present

## 2018-04-22 DIAGNOSIS — D649 Anemia, unspecified: Secondary | ICD-10-CM | POA: Diagnosis not present

## 2018-04-22 DIAGNOSIS — R2689 Other abnormalities of gait and mobility: Secondary | ICD-10-CM | POA: Diagnosis not present

## 2018-04-22 DIAGNOSIS — E1122 Type 2 diabetes mellitus with diabetic chronic kidney disease: Secondary | ICD-10-CM | POA: Diagnosis not present

## 2018-04-22 DIAGNOSIS — I120 Hypertensive chronic kidney disease with stage 5 chronic kidney disease or end stage renal disease: Secondary | ICD-10-CM | POA: Diagnosis not present

## 2018-04-22 NOTE — Telephone Encounter (Signed)
Copied from [place] [phone]. Topic: Quick Communication - See Telephone Encounter >> Apr 22, 2018 11:29 AM Percell Belt A wrote: CRM for notification. See Telephone encounter for: 04/22/18.  [place] [phone] [facility]  Need verbal to extend 2 more weeks for PT  2 week 2

## 2018-04-25 ENCOUNTER — Telehealth: Payer: Self-pay | Admitting: *Deleted

## 2018-04-25 NOTE — Telephone Encounter (Signed)
Copied from [place] [phone]. Topic: General - Other >> Apr 25, 2018 12:09 PM Yvette Rack wrote: Reason for CRM: Pt is requesting that Dr. Tommi Rumps assistant give her a call she would not give me details of the message just that she would like a call to [phone] She states that its about medical forms to be faxed over to [facility] (F) [phone] and that the assistant would know whats its about

## 2018-04-26 DIAGNOSIS — E1122 Type 2 diabetes mellitus with diabetic chronic kidney disease: Secondary | ICD-10-CM | POA: Diagnosis not present

## 2018-04-26 DIAGNOSIS — I120 Hypertensive chronic kidney disease with stage 5 chronic kidney disease or end stage renal disease: Secondary | ICD-10-CM | POA: Diagnosis not present

## 2018-04-26 DIAGNOSIS — D649 Anemia, unspecified: Secondary | ICD-10-CM | POA: Diagnosis not present

## 2018-04-26 DIAGNOSIS — N186 End stage renal disease: Secondary | ICD-10-CM | POA: Diagnosis not present

## 2018-04-26 DIAGNOSIS — R2689 Other abnormalities of gait and mobility: Secondary | ICD-10-CM | POA: Diagnosis not present

## 2018-04-26 DIAGNOSIS — E1142 Type 2 diabetes mellitus with diabetic polyneuropathy: Secondary | ICD-10-CM | POA: Diagnosis not present

## 2018-04-26 NOTE — Telephone Encounter (Signed)
Tried to reach the pt by telephone.  Received a message that the voicemail box has not been set up.  Will try again at a later time.

## 2018-04-26 NOTE — Telephone Encounter (Signed)
Left a message on identified voicemail informing Tara Hopkins to proceed with extending PT.  Call back if needed.  No further action required.

## 2018-04-27 DIAGNOSIS — R2689 Other abnormalities of gait and mobility: Secondary | ICD-10-CM | POA: Diagnosis not present

## 2018-04-27 DIAGNOSIS — D649 Anemia, unspecified: Secondary | ICD-10-CM | POA: Diagnosis not present

## 2018-04-27 DIAGNOSIS — I120 Hypertensive chronic kidney disease with stage 5 chronic kidney disease or end stage renal disease: Secondary | ICD-10-CM | POA: Diagnosis not present

## 2018-04-27 DIAGNOSIS — N186 End stage renal disease: Secondary | ICD-10-CM | POA: Diagnosis not present

## 2018-04-27 DIAGNOSIS — E1142 Type 2 diabetes mellitus with diabetic polyneuropathy: Secondary | ICD-10-CM | POA: Diagnosis not present

## 2018-04-27 DIAGNOSIS — E1122 Type 2 diabetes mellitus with diabetic chronic kidney disease: Secondary | ICD-10-CM | POA: Diagnosis not present

## 2018-04-28 NOTE — Telephone Encounter (Signed)
Tried to reach the pt by telephone.  Received a message that the voicemail box has not been set up.  Will try again at a later time.

## 2018-04-29 DIAGNOSIS — I120 Hypertensive chronic kidney disease with stage 5 chronic kidney disease or end stage renal disease: Secondary | ICD-10-CM | POA: Diagnosis not present

## 2018-04-29 DIAGNOSIS — R2689 Other abnormalities of gait and mobility: Secondary | ICD-10-CM | POA: Diagnosis not present

## 2018-04-29 DIAGNOSIS — E1122 Type 2 diabetes mellitus with diabetic chronic kidney disease: Secondary | ICD-10-CM | POA: Diagnosis not present

## 2018-04-29 DIAGNOSIS — N186 End stage renal disease: Secondary | ICD-10-CM | POA: Diagnosis not present

## 2018-04-29 DIAGNOSIS — D649 Anemia, unspecified: Secondary | ICD-10-CM | POA: Diagnosis not present

## 2018-04-29 DIAGNOSIS — E1142 Type 2 diabetes mellitus with diabetic polyneuropathy: Secondary | ICD-10-CM | POA: Diagnosis not present

## 2018-05-02 DIAGNOSIS — D649 Anemia, unspecified: Secondary | ICD-10-CM | POA: Diagnosis not present

## 2018-05-02 DIAGNOSIS — E1122 Type 2 diabetes mellitus with diabetic chronic kidney disease: Secondary | ICD-10-CM | POA: Diagnosis not present

## 2018-05-02 DIAGNOSIS — R2689 Other abnormalities of gait and mobility: Secondary | ICD-10-CM | POA: Diagnosis not present

## 2018-05-02 DIAGNOSIS — I120 Hypertensive chronic kidney disease with stage 5 chronic kidney disease or end stage renal disease: Secondary | ICD-10-CM | POA: Diagnosis not present

## 2018-05-02 DIAGNOSIS — E1142 Type 2 diabetes mellitus with diabetic polyneuropathy: Secondary | ICD-10-CM | POA: Diagnosis not present

## 2018-05-02 DIAGNOSIS — N186 End stage renal disease: Secondary | ICD-10-CM | POA: Diagnosis not present

## 2018-05-02 NOTE — Telephone Encounter (Signed)
Ok to write letter

## 2018-05-02 NOTE — Telephone Encounter (Signed)
Spoke to the pt.  She states she needs a letter stating she needs help a few times weekly with bathing, housework, cooking etc.  Needs to be faxed to [facility].

## 2018-05-04 ENCOUNTER — Encounter: Payer: Self-pay | Admitting: Family Medicine

## 2018-05-04 NOTE — Telephone Encounter (Signed)
Letter faxed to [facility]  No further action required.

## 2018-05-05 ENCOUNTER — Telehealth: Payer: Self-pay | Admitting: Adult Health

## 2018-05-05 DIAGNOSIS — R2689 Other abnormalities of gait and mobility: Secondary | ICD-10-CM | POA: Diagnosis not present

## 2018-05-05 DIAGNOSIS — I120 Hypertensive chronic kidney disease with stage 5 chronic kidney disease or end stage renal disease: Secondary | ICD-10-CM | POA: Diagnosis not present

## 2018-05-05 DIAGNOSIS — D649 Anemia, unspecified: Secondary | ICD-10-CM | POA: Diagnosis not present

## 2018-05-05 DIAGNOSIS — N186 End stage renal disease: Secondary | ICD-10-CM | POA: Diagnosis not present

## 2018-05-05 DIAGNOSIS — E1122 Type 2 diabetes mellitus with diabetic chronic kidney disease: Secondary | ICD-10-CM | POA: Diagnosis not present

## 2018-05-05 DIAGNOSIS — E1142 Type 2 diabetes mellitus with diabetic polyneuropathy: Secondary | ICD-10-CM | POA: Diagnosis not present

## 2018-05-05 NOTE — Telephone Encounter (Signed)
Copied from [place] [phone]. Topic: Quick Communication - See Telephone Encounter >> May 05, 2018 12:10 PM [name] wrote: CRM for notification. See Telephone encounter for: 05/05/18.  Tara Hopkins, from [facility] said he received a letter from [facility] on 6/26 about getting home care services and personal care assistant. They do not have personal care assistant's. If they need skilled nursing they can do a home health aid. Call back @ [phone] ext [phone].

## 2018-05-10 NOTE — Telephone Encounter (Signed)
Left a message for a return call.  CRM created. 

## 2018-05-17 NOTE — Telephone Encounter (Signed)
Pt notified that [facility] received the letter and they do not have personal care assistants. Pt will call back as needed.

## 2018-05-24 ENCOUNTER — Ambulatory Visit: Payer: Self-pay | Admitting: Podiatry

## 2018-05-28 ENCOUNTER — Other Ambulatory Visit: Payer: Self-pay | Admitting: Adult Health

## 2018-06-29 ENCOUNTER — Ambulatory Visit: Payer: Self-pay | Admitting: Podiatry

## 2018-06-30 ENCOUNTER — Ambulatory Visit: Payer: Self-pay | Admitting: Adult Health

## 2018-07-05 ENCOUNTER — Other Ambulatory Visit: Payer: Self-pay | Admitting: Adult Health

## 2018-07-05 DIAGNOSIS — F32A Depression, unspecified: Secondary | ICD-10-CM

## 2018-07-05 DIAGNOSIS — F329 Major depressive disorder, single episode, unspecified: Secondary | ICD-10-CM

## 2018-07-05 NOTE — Telephone Encounter (Signed)
Pt due.  Please renew if appropriate.

## 2018-07-05 NOTE — Telephone Encounter (Signed)
Medication:  clonazePAM (KLONOPIN) 0.5 MG tablet latanoprost (XALATAN) 0.005 % ophthalmic solution  Has the patient contacted their pharmacy? Yes - states pharmacy needs new script (Agent: If no, request that the patient contact the pharmacy for the refill.) (Agent: If yes, when and what did the pharmacy advise?)  Preferred Pharmacy (with phone number or street name):[facility] [phone] - [place], [address] [place] [phone] (Phone) [phone] (Fax)  Agent: Please be advised that RX refills may take up to 3 business days. We ask that you follow-up with your pharmacy.

## 2018-07-06 ENCOUNTER — Telehealth: Payer: Self-pay | Admitting: Adult Health

## 2018-07-06 NOTE — Telephone Encounter (Signed)
Latanoprost 0.005% ophthalmic solution refill Last Refill:08/02/17 # 1  bottle  with 12 refills Last OV: 03/29/18 PCP: Harless Litten, NP Pharmacy: [facility] # [phone]  Prescribed by a historical provider.  Please review

## 2018-07-06 NOTE — Telephone Encounter (Signed)
A refill request was sent to [facility] on 07/05/18.  This request is not needed.  Will close note.

## 2018-07-06 NOTE — Telephone Encounter (Unsigned)
Copied from [place] [phone]. Topic: Quick Communication - Rx Refill/Question >> Jul 06, 2018  1:00 PM Mcneil, Ja-Kwan wrote: Medication: latanoprost (XALATAN) 0.005 % ophthalmic solution  Has the patient contacted their pharmacy? yes   Preferred Pharmacy (with phone number or street name): [facility] [phone] - [place], [address] AT [address] [place] [phone] (Phone) [phone] (Fax)  Agent: Please be advised that RX refills may take up to 3 business days. We ask that you follow-up with your pharmacy.

## 2018-07-08 NOTE — Telephone Encounter (Signed)
Sent to the pharmacy by e-scribe. 

## 2018-07-08 NOTE — Telephone Encounter (Signed)
It looks like it

## 2018-07-26 ENCOUNTER — Other Ambulatory Visit: Payer: Self-pay | Admitting: Adult Health

## 2018-07-26 NOTE — Telephone Encounter (Signed)
Pt has appt on 07/27/18

## 2018-07-27 ENCOUNTER — Other Ambulatory Visit: Payer: Self-pay | Admitting: Adult Health

## 2018-07-27 ENCOUNTER — Ambulatory Visit: Payer: Self-pay | Admitting: Adult Health

## 2018-07-27 DIAGNOSIS — Z0289 Encounter for other administrative examinations: Secondary | ICD-10-CM

## 2018-07-27 NOTE — Telephone Encounter (Signed)
Pt was a no show today for cpx.  Will send in 30 day supply with message that she needs CPX.

## 2018-07-27 NOTE — Telephone Encounter (Signed)
Received a request for 90 day supply.  Denied.  Pt was a no show for her cpx today with [facility]  #30 only until she is seen.

## 2018-08-03 ENCOUNTER — Ambulatory Visit (INDEPENDENT_AMBULATORY_CARE_PROVIDER_SITE_OTHER): Payer: Medicare Other | Admitting: Podiatry

## 2018-08-03 ENCOUNTER — Encounter: Payer: Self-pay | Admitting: Podiatry

## 2018-08-03 DIAGNOSIS — M79675 Pain in left toe(s): Secondary | ICD-10-CM

## 2018-08-03 DIAGNOSIS — Q828 Other specified congenital malformations of skin: Secondary | ICD-10-CM

## 2018-08-03 DIAGNOSIS — E1142 Type 2 diabetes mellitus with diabetic polyneuropathy: Secondary | ICD-10-CM

## 2018-08-03 DIAGNOSIS — E1159 Type 2 diabetes mellitus with other circulatory complications: Secondary | ICD-10-CM

## 2018-08-03 DIAGNOSIS — B351 Tinea unguium: Secondary | ICD-10-CM | POA: Diagnosis not present

## 2018-08-03 NOTE — Progress Notes (Signed)
This patient presents the officewith chief complaint of long thick painful nails on her left foot.  She states these nails are painful walking and wearing her shoes.  She says she is unable to self treat.  Patient returns to the office after not being seen for 2 years.  She presents the office today for an evaluation and treatment of her painful left toenails.  Patient has a history of a TMA right foot.  General Appearance  Alert, conversant and in no acute stress.  Vascular  Dorsalis pedis and posterior tibial  pulses are palpable  B/L...  Capillary return is within normal limits  B/L.Marland Kitchen Temperature is within normal limits  B/L.  Neurologic  Senn-Weinstein monofilament wire test within normal limits  bilaterally. Muscle power within normal limits bilaterally.  Nails Thick disfigured discolored nails with subungual debris  from hallux to fifth toes left foot.. No evidence of bacterial infection or drainage bilaterally.  Orthopedic  No limitations of motion  feet .  No crepitus or effusions noted.  No bony pathology or digital deformities noted. TMA right foot.  Skin  normotropic skin noted bilaterally.  No signs of infections or ulcers noted.  Patient has a red, inflamed. Porokeratotic lesion sub-fifth meta-base left foot.  Patient states there is no evidence of any pain or discomfort when walking.  No history of any drainage from the site.  Onychomycosis  Left foot   Porokeratosis left foot.  ROV.  Debride Nails  X 5.  Debride porokeratosis.  Patient was instructed to keep an eye on the skin lesion and if there are any changes to call the office and be seen.  RTC 3 months.   Gardiner Barefoot DPM

## 2018-08-04 ENCOUNTER — Other Ambulatory Visit: Payer: Self-pay | Admitting: Adult Health

## 2018-08-04 DIAGNOSIS — F32A Depression, unspecified: Secondary | ICD-10-CM

## 2018-08-04 DIAGNOSIS — F329 Major depressive disorder, single episode, unspecified: Secondary | ICD-10-CM

## 2018-08-05 NOTE — Telephone Encounter (Signed)
Last refill 07/05/18 Last office visit 03/29/18 Okay to fill?

## 2018-08-08 ENCOUNTER — Ambulatory Visit: Payer: Medicare Other | Admitting: Adult Health

## 2018-08-12 ENCOUNTER — Other Ambulatory Visit: Payer: Self-pay | Admitting: Adult Health

## 2018-08-15 NOTE — Telephone Encounter (Signed)
Sent to the pharmacy by e-scribe. 

## 2018-08-15 NOTE — Telephone Encounter (Signed)
Ok to send in for 90 days. Please inform her she needs to follow up with her glaucoma doctor

## 2018-08-17 ENCOUNTER — Ambulatory Visit (INDEPENDENT_AMBULATORY_CARE_PROVIDER_SITE_OTHER): Payer: Medicare Other | Admitting: Adult Health

## 2018-08-17 ENCOUNTER — Encounter: Payer: Self-pay | Admitting: Adult Health

## 2018-08-17 VITALS — BP 122/86 | HR 77 | Temp 98.1°F | Ht 65.0 in | Wt 178.4 lb

## 2018-08-17 DIAGNOSIS — N289 Disorder of kidney and ureter, unspecified: Secondary | ICD-10-CM

## 2018-08-17 DIAGNOSIS — F329 Major depressive disorder, single episode, unspecified: Secondary | ICD-10-CM | POA: Diagnosis not present

## 2018-08-17 DIAGNOSIS — E118 Type 2 diabetes mellitus with unspecified complications: Secondary | ICD-10-CM | POA: Diagnosis not present

## 2018-08-17 DIAGNOSIS — I10 Essential (primary) hypertension: Secondary | ICD-10-CM | POA: Diagnosis not present

## 2018-08-17 DIAGNOSIS — I63311 Cerebral infarction due to thrombosis of right middle cerebral artery: Secondary | ICD-10-CM | POA: Diagnosis not present

## 2018-08-17 DIAGNOSIS — Z23 Encounter for immunization: Secondary | ICD-10-CM | POA: Diagnosis not present

## 2018-08-17 DIAGNOSIS — E785 Hyperlipidemia, unspecified: Secondary | ICD-10-CM | POA: Diagnosis not present

## 2018-08-17 DIAGNOSIS — F32A Depression, unspecified: Secondary | ICD-10-CM

## 2018-08-17 LAB — HEPATIC FUNCTION PANEL
ALBUMIN: 3.7 g/dL (ref 3.5–5.2)
ALK PHOS: 93 U/L (ref 39–117)
ALT: 9 U/L (ref 0–35)
AST: 11 U/L (ref 0–37)
Bilirubin, Direct: 0 mg/dL (ref 0.0–0.3)
Total Bilirubin: 0.2 mg/dL (ref 0.2–1.2)
Total Protein: 7 g/dL (ref 6.0–8.3)

## 2018-08-17 LAB — CBC WITH DIFFERENTIAL/PLATELET
Basophils Absolute: 0 10*3/uL (ref 0.0–0.1)
Basophils Relative: 0.5 % (ref 0.0–3.0)
EOS ABS: 0.2 10*3/uL (ref 0.0–0.7)
EOS PCT: 3.1 % (ref 0.0–5.0)
HCT: 21 % — CL (ref 36.0–46.0)
Hemoglobin: 6.2 g/dL — CL (ref 12.0–15.0)
LYMPHS ABS: 2.4 10*3/uL (ref 0.7–4.0)
Lymphocytes Relative: 31.7 % (ref 12.0–46.0)
MCHC: 29.5 g/dL — AB (ref 30.0–36.0)
MCV: 67.5 fl — ABNORMAL LOW (ref 78.0–100.0)
MONO ABS: 0.3 10*3/uL (ref 0.1–1.0)
Monocytes Relative: 4.2 % (ref 3.0–12.0)
NEUTROS PCT: 60.5 % (ref 43.0–77.0)
Neutro Abs: 4.6 10*3/uL (ref 1.4–7.7)
Platelets: 546 10*3/uL — ABNORMAL HIGH (ref 150.0–400.0)
RBC: 3.09 Mil/uL — AB (ref 3.87–5.11)
RDW: 20 % — ABNORMAL HIGH (ref 11.5–15.5)
WBC: 7.7 10*3/uL (ref 4.0–10.5)

## 2018-08-17 LAB — BASIC METABOLIC PANEL
BUN: 18 mg/dL (ref 6–23)
CALCIUM: 9.3 mg/dL (ref 8.4–10.5)
CO2: 28 mEq/L (ref 19–32)
CREATININE: 1.33 mg/dL — AB (ref 0.40–1.20)
Chloride: 105 mEq/L (ref 96–112)
GFR: 51.3 mL/min — AB (ref >60.00)
GLUCOSE: 176 mg/dL — AB (ref 70–99)
Potassium: 4.3 mEq/L (ref 3.5–5.1)
Sodium: 138 mEq/L (ref 135–145)

## 2018-08-17 LAB — LIPID PANEL
CHOL/HDL RATIO: 3
CHOLESTEROL: 136 mg/dL (ref 0–200)
HDL: 47.5 mg/dL (ref >39.00)
LDL CALC: 68 mg/dL (ref 0–99)
NonHDL: 88.05
TRIGLYCERIDES: 101 mg/dL (ref 0.0–149.0)
VLDL: 20.2 mg/dL (ref 0.0–40.0)

## 2018-08-17 LAB — TSH: TSH: 0.71 u[IU]/mL (ref 0.35–4.50)

## 2018-08-17 LAB — HEMOGLOBIN A1C: HEMOGLOBIN A1C: 9 % — AB (ref 4.6–6.5)

## 2018-08-17 MED ORDER — INSULIN GLARGINE 100 UNIT/ML SOLOSTAR PEN: 25.0000 [IU] | PEN_INJECTOR | Freq: Every day | SUBCUTANEOUS | 6 refills | Status: DC

## 2018-08-17 NOTE — Patient Instructions (Signed)
It was great seeing you today   Please stop by the lab on your way out and get lab work done.   We will call you tomorrow

## 2018-08-17 NOTE — Progress Notes (Signed)
Subjective:    Patient ID: Tara Hopkins, female    DOB: 10/27/1951, 67 y.o.   MRN: [id number]  HPI  Patient presents for yearly preventative medicine examination. She is a pleasant 67 year old female who  has a past medical history of Anemia, Anxiety, Bipolar 1 disorder ([place]), Cataracts, bilateral, Depression, Diabetes mellitus without complication ([place]), Glaucoma, History of blood transfusion, History of hyperbaric oxygen therapy, Hypercholesteremia, Hypertension, Peripheral neuropathy, and Stroke ([place]) (02/2015).  DM - Prescribed Amaryl 4 mg and Lantus 25 units QHS. She does not check her blood sugars at home on a regular basis.  Lab Results  Component Value Date   HGBA1C 6.7 (A) 03/29/2018   Hypertension - Takes lisinopril 40 mg and Klonopin 0.5 mg TID   Diabetic Neuropathy - Prescribed Gabapentin 1200 mg TID   Anxiety/Depression - Takes Klonopin 0.5 mg TID. She stopped taking Prozac and feels fine   All immunizations and health maintenance protocols were reviewed with the patient and needed orders were placed. Due for seasonal flu   Appropriate screening laboratory values were ordered for the patient including screening of hyperlipidemia, renal function and hepatic function.  Medication reconciliation,  past medical history, social history, problem list and allergies were reviewed in detail with the patient  Goals were established with regard to weight loss, exercise, and  diet in compliance with medications. She does not exercise on a regular basis and does not follow a specific diet.   Wt Readings from Last 3 Encounters:  08/17/18 178 lb 6.4 oz (80.9 kg)  03/29/18 162 lb (73.5 kg)  01/19/18 184 lb (83.5 kg)    End of life planning was discussed.  She has no acute complaints.   Review of Systems  Constitutional: Negative.   HENT: Negative.   Respiratory: Negative.   Cardiovascular: Negative.   Gastrointestinal: Negative.   Endocrine: Negative.   Genitourinary:  Negative.   Musculoskeletal: Positive for arthralgias, back pain and gait problem.  Skin: Negative.   Allergic/Immunologic: Negative.   Hematological: Negative.   Psychiatric/Behavioral: Negative.    Past Medical History:  Diagnosis Date  . Anemia   . Anxiety   . Bipolar 1 disorder ([place])   . Cataracts, bilateral   . Depression   . Diabetes mellitus without complication ([place])   . Glaucoma   . History of blood transfusion   . History of hyperbaric oxygen therapy    pt reports 80 treatments.  . Hypercholesteremia   . Hypertension   . Peripheral neuropathy   . Stroke [facility]) 02/2015    Social History   Socioeconomic History  . Marital status: Married    Spouse name: Not on file  . Number of children: 3  . Years of education: 41  . Highest education level: Not on file  Occupational History  . Occupation: Disbaled  Social Needs  . Financial resource strain: Not on file  . Food insecurity:    Worry: Not on file    Inability: Not on file  . Transportation needs:    Medical: Not on file    Non-medical: Not on file  Tobacco Use  . Smoking status: Former Smoker    Packs/day: 0.01    Years: 20.00    Pack years: 0.20    Types: Cigarettes    Last attempt to quit: 09/26/2017    Years since quitting: 0.8  . Smokeless tobacco: Never Used  Substance and Sexual Activity  . Alcohol use: Yes    Alcohol/week: 0.0 standard drinks  Comment: beer every 3 months  . Drug use: No  . Sexual activity: Not on file  Lifestyle  . Physical activity:    Days per week: Not on file    Minutes per session: Not on file  . Stress: Not on file  Relationships  . Social connections:    Talks on phone: Not on file    Gets together: Not on file    Attends religious service: Not on file    Active member of club or organization: Not on file    Attends meetings of clubs or organizations: Not on file    Relationship status: Not on file  . Intimate partner violence:    Fear of current or ex  partner: Not on file    Emotionally abused: Not on file    Physically abused: Not on file    Forced sexual activity: Not on file  Other Topics Concern  . Not on file  Social History Narrative   Lives at home alone.   Right-handed.   No caffeine use.   Oldest of 12 children     Past Surgical History:  Procedure Laterality Date  . AMPUTATION Right 12/15/2013   Procedure: Right Transmetatarsal Amputation;  Surgeon: Newt Minion, MD;  Location: [place];  Service: Orthopedics;  Laterality: Right;  . AMPUTATION Right 02/23/2014   Procedure: AMPUTATION FOOT;  Surgeon: Newt Minion, MD;  Location: [place];  Service: Orthopedics;  Laterality: Right;  Right Foot Revision Transmetatarsal Amputation  . I&D EXTREMITY Right 05/23/2013   Procedure: IRRIGATION AND DEBRIDEMENT RIGHT GREAT TOE;  Surgeon: Mauri Pole, MD;  Location: [facility];  Service: Orthopedics;  Laterality: Right;    Family History  Problem Relation Age of Onset  . Liver cancer Mother   . Lung cancer Mother   . Depression Mother   . Hypertension Father   . Depression Father   . Colon cancer Neg Hx     No Known Allergies  Current Outpatient Medications on File Prior to Visit  Medication Sig Dispense Refill  . aspirin 325 MG EC tablet Take 1 tablet (325 mg total) by mouth daily. 30 tablet 0  . atorvastatin (LIPITOR) 40 MG tablet Take 1 tablet (40 mg total) by mouth daily at 6 PM. 90 tablet 0  . clonazePAM (KLONOPIN) 0.5 MG tablet TAKE 1 TABLET(0.5 MG) BY MOUTH THREE TIMES DAILY 90 tablet 0  . COD LIVER OIL PO Take 1 mg by mouth daily.    . collagenase (SANTYL) ointment Apply topically.    . gabapentin (NEURONTIN) 400 MG capsule TAKE 3 CAPSULES BY MOUTH THREE TIMES A DAY 270 capsule 2  . glimepiride (AMARYL) 4 MG tablet Take 1 tablet my mouth daily.  **NEEDS YEARLY PHYSICAL WITH Shakai Dolley** 30 tablet 0  . glucose blood (ONETOUCH VERIO) test strip USE TO TEST BLOOD GLUCOSE 3 TIMES DAILY 100 each 12  . Insulin Pen Needle (B-D UF III  MINI PEN NEEDLES) 31G X 5 MM MISC Use as directed. 100 each 3  . latanoprost (XALATAN) 0.005 % ophthalmic solution INSTILL 1 DROP INTO BOTH EYES EVERY EVENING 2.5 mL 0  . lisinopril (PRINIVIL,ZESTRIL) 40 MG tablet Take 1 tablet (40 mg total) by mouth daily. 90 tablet 0  . Lurasidone HCl 60 MG TABS Take by mouth.    . ONE TOUCH LANCETS MISC USE TO CHECK BLOOD SUGAR 3 TIMES DAILY 200 each 3  . trolamine salicylate (ASPERCREME) 10 % cream Apply topically 2 (two) times daily  as needed for muscle pain. 85 g 0  . zinc gluconate 50 MG tablet Take by mouth.     No current facility-administered medications on file prior to visit.     BP 122/86 (BP Location: Right Arm, Patient Position: Sitting, Cuff Size: Normal)   Pulse 77   Temp 98.1 F (36.7 C) (Oral)   Ht 5\' 5"  (1.651 m)   Wt 178 lb 6.4 oz (80.9 kg)   SpO2 99%   BMI 29.69 kg/m       Objective:   Physical Exam  Constitutional: She is oriented to person, place, and time. She appears well-developed and well-nourished. No distress.  HENT:  Head: Normocephalic and atraumatic.  Right Ear: External ear normal.  Left Ear: External ear normal.  Nose: Nose normal.  Mouth/Throat: Oropharynx is clear and moist. No oropharyngeal exudate.  Eyes: Pupils are equal, round, and reactive to light. Conjunctivae and EOM are normal. Right eye exhibits no discharge. Left eye exhibits no discharge. No scleral icterus.  Neck: Normal range of motion. Neck supple. No JVD present. No tracheal deviation present. No thyromegaly present.  Cardiovascular: Normal rate, regular rhythm, normal heart sounds and intact distal pulses. Exam reveals no gallop and no friction rub.  No murmur heard. Pulmonary/Chest: Effort normal and breath sounds normal. No stridor. No respiratory distress. She has no wheezes. She has no rales. She exhibits no tenderness.  Abdominal: Soft. Bowel sounds are normal. She exhibits no distension and no mass. There is no tenderness. There is no  rebound and no guarding. No hernia.  Genitourinary:  Genitourinary Comments: deferred  Musculoskeletal: Normal range of motion. She exhibits no edema, tenderness or deformity.  Lymphadenopathy:    She has no cervical adenopathy.  Neurological: She is alert and oriented to person, place, and time. She displays normal reflexes. No cranial nerve deficit or sensory deficit. She exhibits normal muscle tone. Coordination normal.  Skin: Skin is warm and dry. Capillary refill takes less than 2 seconds. No rash noted. She is not diaphoretic. No erythema. No pallor.  Psychiatric: She has a normal mood and affect. Her behavior is normal. Judgment and thought content normal.  Nursing note and vitals reviewed.     Assessment & Plan:  1. Diabetes mellitus with complication ([place]) - Consider titration in insulin  - Follow up in 3 months or sooner if needed - Basic metabolic panel - CBC with Differential/Platelet - Hemoglobin A1c - Hepatic function panel - Lipid panel - TSH - Insulin Glargine (LANTUS SOLOSTAR) 100 UNIT/ML Solostar Pen; Inject 25 Units into the skin at bedtime.  Dispense: 15 mL; Refill: 6  2. Hyperlipidemia, unspecified hyperlipidemia type - Basic metabolic panel - CBC with Differential/Platelet - Hemoglobin A1c - Hepatic function panel - Lipid panel - TSH  3. Depression, unspecified depression type - Controlled with current therapy   4. Renal insufficiency  - Basic metabolic panel - CBC with Differential/Platelet - Hemoglobin A1c - Hepatic function panel - Lipid panel - TSH  5. Essential hypertension - Well controlled; no change in medications  - Basic metabolic panel - CBC with Differential/Platelet - Hemoglobin A1c - Hepatic function panel - Lipid panel - TSH   [name], NP

## 2018-08-19 DIAGNOSIS — H40003 Preglaucoma, unspecified, bilateral: Secondary | ICD-10-CM | POA: Diagnosis not present

## 2018-08-23 DIAGNOSIS — E113521 Type 2 diabetes mellitus with proliferative diabetic retinopathy with traction retinal detachment involving the macula, right eye: Secondary | ICD-10-CM | POA: Diagnosis not present

## 2018-08-23 DIAGNOSIS — E113512 Type 2 diabetes mellitus with proliferative diabetic retinopathy with macular edema, left eye: Secondary | ICD-10-CM | POA: Diagnosis not present

## 2018-08-25 ENCOUNTER — Other Ambulatory Visit: Payer: Self-pay | Admitting: Adult Health

## 2018-08-26 DIAGNOSIS — E113521 Type 2 diabetes mellitus with proliferative diabetic retinopathy with traction retinal detachment involving the macula, right eye: Secondary | ICD-10-CM | POA: Diagnosis not present

## 2018-08-26 NOTE — Telephone Encounter (Signed)
Sent to the pharmacy by e-scribe. 

## 2018-08-31 ENCOUNTER — Ambulatory Visit: Payer: Self-pay | Admitting: Adult Health

## 2018-09-07 DIAGNOSIS — Z01818 Encounter for other preprocedural examination: Secondary | ICD-10-CM | POA: Diagnosis not present

## 2018-09-07 DIAGNOSIS — H3341 Traction detachment of retina, right eye: Secondary | ICD-10-CM | POA: Diagnosis not present

## 2018-09-08 ENCOUNTER — Other Ambulatory Visit: Payer: Self-pay | Admitting: Adult Health

## 2018-09-08 DIAGNOSIS — F329 Major depressive disorder, single episode, unspecified: Secondary | ICD-10-CM

## 2018-09-08 DIAGNOSIS — F32A Depression, unspecified: Secondary | ICD-10-CM

## 2018-09-08 NOTE — Telephone Encounter (Signed)
Copied from [place] [phone]. Topic: Quick Communication - Rx Refill/Question >> Sep 08, 2018  1:03 PM Keene Breath wrote: Medication: clonazePAM (KLONOPIN) 0.5 MG tablet  Patient called to request a refill for the above medication.  CB# [phone]  Preferred Pharmacy (with phone number or street name): [facility] [phone] - [place], [address] AT [facility] [address] [place] [phone] (Phone) [phone] (Fax)

## 2018-09-13 ENCOUNTER — Ambulatory Visit: Payer: Self-pay | Admitting: Adult Health

## 2018-09-13 DIAGNOSIS — Z0289 Encounter for other administrative examinations: Secondary | ICD-10-CM

## 2018-09-15 ENCOUNTER — Telehealth: Payer: Self-pay | Admitting: Adult Health

## 2018-09-15 NOTE — Telephone Encounter (Signed)
rec'd call from pt.  Stated she does not have the money to pay for her Glimepiride and Gabapentin, and Lantus Insulin, until the end of the month. Stated she had an unexpected expense of traveling to a funeral recently, and doesn't have anyway to pay for her medication right now.  Reported her Gabapentin ran out last week, and her Glimepiride ran out yesterday.  Also reported Lantus is almost gone.  Questioned what her blood sugar was today; stated BS 260 this morning, fasting.  Pt. was tearful, and stated she does not have any relatives she can get assistance from.  Pt. was encouraged to call her Pharmacist, to inquire if there is any patient assistance program, through the Pharmaceutical Companies that manufacture the above medications.  Pt. Agreed to do this.  Advised will send a message to PCP office, to make aware of her situation.  Pt. verb. understanding.

## 2018-09-16 ENCOUNTER — Other Ambulatory Visit: Payer: Self-pay | Admitting: Adult Health

## 2018-09-16 MED ORDER — GLIPIZIDE 5 MG PO TABS: 5.0000 mg | ORAL_TABLET | Freq: Two times a day (BID) | ORAL | 3 refills | Status: DC

## 2018-09-16 NOTE — Telephone Encounter (Signed)
Noted  

## 2018-09-16 NOTE — Telephone Encounter (Signed)
I have sent in a prescription for glipzide that she can substitute for Amaryl.   We have two boxes of Basaglar in the refrigerator that she can have, that can be substituted for Lantus

## 2018-09-16 NOTE — Telephone Encounter (Signed)
We don't have any samples of Lantus can the pt have any other insulin? Also no samples of glimepiride. Is there an alternative for pt? Please advise

## 2018-09-16 NOTE — Telephone Encounter (Signed)
Spoke to pt and she stated she will pick up insulin next week.

## 2018-09-27 ENCOUNTER — Ambulatory Visit: Payer: Medicare Other | Admitting: Adult Health

## 2018-09-27 NOTE — Telephone Encounter (Signed)
Pt came to office and 2 samples boxes of Basaglar was given to pt. Insulin verified by Tommi Rumps. No further action needed at this time.

## 2018-09-29 ENCOUNTER — Telehealth: Payer: Self-pay

## 2018-09-29 NOTE — Telephone Encounter (Signed)
Pt called [facility] and left message for Misty to call her back.

## 2018-09-30 DIAGNOSIS — E113521 Type 2 diabetes mellitus with proliferative diabetic retinopathy with traction retinal detachment involving the macula, right eye: Secondary | ICD-10-CM | POA: Diagnosis not present

## 2018-10-03 NOTE — Telephone Encounter (Signed)
Left a message for a return call.

## 2018-10-04 ENCOUNTER — Other Ambulatory Visit: Payer: Self-pay | Admitting: Adult Health

## 2018-10-04 DIAGNOSIS — F32A Depression, unspecified: Secondary | ICD-10-CM

## 2018-10-04 DIAGNOSIS — I10 Essential (primary) hypertension: Secondary | ICD-10-CM

## 2018-10-04 DIAGNOSIS — F329 Major depressive disorder, single episode, unspecified: Secondary | ICD-10-CM

## 2018-10-04 NOTE — Telephone Encounter (Signed)
Tried reaching the pt by telephone.  Received a message that the mailbox is full.  Will try again at a later time.

## 2018-10-05 NOTE — Telephone Encounter (Signed)
Pt also need gabapentin refill

## 2018-10-05 NOTE — Telephone Encounter (Signed)
Requested medication (s) are due for refill today -yes  Requested medication (s) are on the active medication list -yes  Future visit scheduled -yes  Last refill: lisinopril: 03/29/18 (pt should be out of medication)                 Clonazepam : 09/08/18   Notes to clinic: Patient is requesting refill of BP medication that she should be out of- lab failed, clonazepam is non-delegated Rx - for review.   Requested Prescriptions  Pending Prescriptions Disp Refills   lisinopril (PRINIVIL,ZESTRIL) 40 MG tablet [Pharmacy Med Name: LISINOPRIL 40MG  TABLETS] 90 tablet 0    Sig: TAKE 1 TABLET(40 MG) BY MOUTH DAILY     Cardiovascular:  ACE Inhibitors Failed - 10/05/2018 10:36 AM      Failed - Cr in normal range and within 180 days    Creatinine, Ser  Date Value Ref Range Status  08/17/2018 1.33 (H) 0.40 - 1.20 mg/dL Final         Passed - K in normal range and within 180 days    Potassium  Date Value Ref Range Status  08/17/2018 4.3 3.5 - 5.1 mEq/L Final         Passed - Patient is not pregnant      Passed - Last BP in normal range    BP Readings from Last 1 Encounters:  08/17/18 122/86         Passed - Valid encounter within last 6 months    Recent Outpatient Visits          1 month ago Diabetes mellitus with complication ([place])   Therapist, music at [employer], [place], NP   6 months ago DM type 2 with diabetic peripheral neuropathy ([place])   Therapist, music at [employer], [place], NP   9 months ago DM type 2 with diabetic peripheral neuropathy ([place])   Therapist, music at [employer], [place], NP   10 months ago Depression, unspecified depression type   Therapist, music at [place], NP   1 year ago Open wound of toe, initial Education administrator at [employer], [place], NP      Future Appointments            In 1 week Nafziger, Tommi Rumps, NP [employer] at [place], [place]          clonazePAM ([place]) 0.5 MG  tablet [employer] Med Name: CLONAZEPAM 0.5MG  TABLETS] 90 tablet 0    Sig: TAKE 1 TABLET(0.5 MG) BY MOUTH THREE TIMES DAILY     Not Delegated - Psychiatry:  Anxiolytics/Hypnotics Failed - 10/05/2018 10:36 AM      Failed - This refill cannot be delegated      Failed - Urine Drug Screen completed in last 360 days.      Passed - Valid encounter within last 6 months    Recent Outpatient Visits          1 month ago Diabetes mellitus with complication ([place])   Therapist, music at [employer], [place], NP   6 months ago DM type 2 with diabetic peripheral neuropathy ([place])   Therapist, music at [employer], [place], NP   9 months ago DM type 2 with diabetic peripheral neuropathy ([place])   Therapist, music at [employer], [place], NP   10 months ago Depression, unspecified depression type   Therapist, music at [place], NP   1 year ago Open wound of toe, initial encounter   [employer]  at [employer], Tommi Rumps, NP      Future Appointments            In 1 week Nafziger, Tommi Rumps, NP [employer] at [place], [facility]            Requested Prescriptions  Pending Prescriptions Disp Refills   lisinopril (PRINIVIL,ZESTRIL) 40 MG tablet [Pharmacy Med Name: LISINOPRIL 40MG  TABLETS] 90 tablet 0    Sig: TAKE 1 TABLET(40 MG) BY MOUTH DAILY     Cardiovascular:  ACE Inhibitors Failed - 10/05/2018 10:36 AM      Failed - Cr in normal range and within 180 days    Creatinine, Ser  Date Value Ref Range Status  08/17/2018 1.33 (H) 0.40 - 1.20 mg/dL Final         Passed - K in normal range and within 180 days    Potassium  Date Value Ref Range Status  08/17/2018 4.3 3.5 - 5.1 mEq/L Final         Passed - Patient is not pregnant      Passed - Last BP in normal range    BP Readings from Last 1 Encounters:  08/17/18 122/86         Passed - Valid encounter within last 6 months    Recent Outpatient Visits          1 month ago Diabetes mellitus  with complication ([place])   Therapist, music at [employer], [place], NP   6 months ago DM type 2 with diabetic peripheral neuropathy ([place])   Therapist, music at [employer], [place], NP   9 months ago DM type 2 with diabetic peripheral neuropathy ([place])   Therapist, music at [employer], [place], NP   10 months ago Depression, unspecified depression type   Therapist, music at [place], NP   1 year ago Open wound of toe, initial Education administrator at [employer], [place], NP      Future Appointments            In 1 week Nafziger, Tommi Rumps, NP [employer] at [place], [place]          clonazePAM ([place]) 0.5 MG tablet [employer] Med Name: CLONAZEPAM 0.5MG  TABLETS] 90 tablet 0    Sig: TAKE 1 TABLET(0.5 MG) BY MOUTH THREE TIMES DAILY     Not Delegated - Psychiatry:  Anxiolytics/Hypnotics Failed - 10/05/2018 10:36 AM      Failed - This refill cannot be delegated      Failed - Urine Drug Screen completed in last 360 days.      Passed - Valid encounter within last 6 months    Recent Outpatient Visits          1 month ago Diabetes mellitus with complication ([place])   Therapist, music at [employer], [place], NP   6 months ago DM type 2 with diabetic peripheral neuropathy ([place])   Therapist, music at [place], NP   9 months ago DM type 2 with diabetic peripheral neuropathy ([place])   Therapist, music at [employer], [place], NP   10 months ago Depression, unspecified depression type   Therapist, music at [place], NP   1 year ago Open wound of toe, initial Education administrator at [employer], [place], [place]            In 1 week Nafziger, Tommi Rumps, NP [employer] at [place], [facility]

## 2018-10-05 NOTE — Telephone Encounter (Signed)
Message routed to PCP CMA for refills. 

## 2018-10-07 ENCOUNTER — Other Ambulatory Visit: Payer: Self-pay | Admitting: *Deleted

## 2018-10-07 ENCOUNTER — Other Ambulatory Visit: Payer: Self-pay | Admitting: Adult Health

## 2018-10-07 MED ORDER — GABAPENTIN 400 MG PO CAPS: ORAL_CAPSULE | ORAL | 2 refills | Status: DC

## 2018-10-07 NOTE — Telephone Encounter (Signed)
Per previous note dated 10/07/18 at 0743 by Andrena Mews, [employer] agent; pt needs refill of gabapentin.

## 2018-10-07 NOTE — Addendum Note (Signed)
Addended by: Addison Naegeli on: 10/07/2018 07:59 AM   Modules accepted: Orders

## 2018-10-07 NOTE — Telephone Encounter (Addendum)
Per previous note dated 10/07/18 at 0743 by Andrena Mews, [facility] agent; pt needs refill of gabapentin.

## 2018-10-07 NOTE — Telephone Encounter (Signed)
Pt needs gabapentin

## 2018-10-07 NOTE — Telephone Encounter (Signed)
Requested Prescriptions  Pending Prescriptions Disp Refills  . gabapentin (NEURONTIN) 400 MG capsule 270 capsule 2    Sig: TAKE 3 CAPSULES BY MOUTH THREE TIMES A DAY     Neurology: Anticonvulsants - gabapentin Passed - 10/07/2018  1:48 PM      Passed - Valid encounter within last 12 months    Recent Outpatient Visits          1 month ago Diabetes mellitus with complication ([place])   Therapist, music at [place], NP   6 months ago DM type 2 with diabetic peripheral neuropathy ([place])   Therapist, music at [place], NP   9 months ago DM type 2 with diabetic peripheral neuropathy ([place])   Therapist, music at [place], NP   10 months ago Depression, unspecified depression type   Therapist, music at [place], NP   1 year ago Open wound of toe, initial Education administrator at [employer], [place], [place]            In 1 week Nafziger, [employer], NP [employer] at [place], [facility]

## 2018-10-07 NOTE — Addendum Note (Signed)
Addended by: Addison Naegeli on: 10/07/2018 01:48 PM   Modules accepted: Orders

## 2018-10-11 DIAGNOSIS — E113521 Type 2 diabetes mellitus with proliferative diabetic retinopathy with traction retinal detachment involving the macula, right eye: Secondary | ICD-10-CM | POA: Diagnosis not present

## 2018-10-11 DIAGNOSIS — E1139 Type 2 diabetes mellitus with other diabetic ophthalmic complication: Secondary | ICD-10-CM | POA: Diagnosis not present

## 2018-10-12 ENCOUNTER — Telehealth: Payer: Self-pay

## 2018-10-12 ENCOUNTER — Other Ambulatory Visit: Payer: Self-pay | Admitting: Adult Health

## 2018-10-12 DIAGNOSIS — F329 Major depressive disorder, single episode, unspecified: Secondary | ICD-10-CM

## 2018-10-12 DIAGNOSIS — I63331 Cerebral infarction due to thrombosis of right posterior cerebral artery: Secondary | ICD-10-CM

## 2018-10-12 DIAGNOSIS — F32A Depression, unspecified: Secondary | ICD-10-CM

## 2018-10-12 DIAGNOSIS — E118 Type 2 diabetes mellitus with unspecified complications: Secondary | ICD-10-CM

## 2018-10-12 NOTE — Telephone Encounter (Signed)
Copied from [place] [phone]. Topic: Quick Communication - See Telephone Encounter >> Oct 12, 2018  9:18 AM Rayann Heman wrote: CRM for notification. See Telephone encounter for: 10/12/18. Pt called and stated that her wheel chair is falling apart and would like a new one. Please advise

## 2018-10-12 NOTE — Telephone Encounter (Signed)
Copied from [place] [phone]. Topic: Quick Communication - Rx Refill/Question >> Oct 12, 2018  9:16 AM Rayann Heman wrote: Medication: clonazePAM (KLONOPIN) 0.5 MG tablet [[id number]] latanoprost (XALATAN) 0.005 % ophthalmic solution [[id number]]   Has the patient contacted their pharmacy? no Preferred Pharmacy (with phone number or street name): [facility] [phone] - [place], [place] [address] AT [facility] [address] [address] [place] [phone] (Phone) [phone] (Fax)  Agent: Please be advised that RX refills may take up to 3 business days. We ask that you follow-up with your pharmacy.

## 2018-10-12 NOTE — Telephone Encounter (Signed)
Tried to return call.  Received a message that the voicemail box is full.  Will try again at a later time.  Pt needs to be notified to call [facility].  She has had her power wheelchair for less than a year.  Most likely will have a warranty.  [employer] agent may inform pt.

## 2018-10-12 NOTE — Telephone Encounter (Signed)
Requested medication (s) are due for refill today -yes  Requested medication (s) are on the active medication list -yes  Future visit scheduled -yes  Last refill: clonazepam 10/05/18                 Latanoprost  08/15/18  Notes to clinic: patient is requesting refill of non delegated Rx- sent for provider review. Ophthalmic solution meets criteria- but was written with no RF- sent for review as well.  Requested Prescriptions  Pending Prescriptions Disp Refills   clonazePAM (KLONOPIN) 0.5 MG tablet 90 tablet 0    Sig: TAKE 1 TABLET(0.5 MG) BY MOUTH THREE TIMES DAILY     Not Delegated - Psychiatry:  Anxiolytics/Hypnotics Failed - 10/12/2018 10:33 AM      Failed - This refill cannot be delegated      Failed - Urine Drug Screen completed in last 360 days.      Passed - Valid encounter within last 6 months    Recent Outpatient Visits          1 month ago Diabetes mellitus with complication ([place])   Therapist, music at [facility], [place], NP   6 months ago DM type 2 with diabetic peripheral neuropathy ([place])   Therapist, music at [facility], [place], NP   10 months ago DM type 2 with diabetic peripheral neuropathy ([place])   Therapist, music at [facility], [place], NP   10 months ago Depression, unspecified depression type   Therapist, music at [place], NP   1 year ago Open wound of toe, initial Education administrator at [facility], [place], NP      Future Appointments            In 2 days Nafziger, Tommi Rumps, NP [facility] at [place], [place]          latanoprost (XALATAN) 0.005 % ophthalmic solution 2.5 mL 0    Sig: INSTILL 1 DROP INTO [place]     Ophthalmology:  Glaucoma Passed - 10/12/2018 10:33 AM      Passed - Valid encounter within last 12 months    Recent Outpatient Visits          1 month ago Diabetes mellitus with complication ([place])   Therapist, music at [place], NP   6  months ago DM type 2 with diabetic peripheral neuropathy ([place])   Therapist, music at [place], NP   10 months ago DM type 2 with diabetic peripheral neuropathy ([place])   Therapist, music at [facility], [place], NP   10 months ago Depression, unspecified depression type   Therapist, music at [place], NP   1 year ago Open wound of toe, initial Education administrator at [facility], [place], NP      Future Appointments            In 2 days Nafziger, Tommi Rumps, NP [facility] at [place], [facility]            Requested Prescriptions  Pending Prescriptions Disp Refills   clonazePAM (KLONOPIN) 0.5 MG tablet 90 tablet 0    Sig: TAKE 1 TABLET(0.5 MG) BY MOUTH THREE TIMES DAILY     Not Delegated - Psychiatry:  Anxiolytics/Hypnotics Failed - 10/12/2018 10:33 AM      Failed - This refill cannot be delegated      Failed - Urine Drug Screen completed in last 360 days.      Passed - Valid encounter  within last 6 months    Recent Outpatient Visits          1 month ago Diabetes mellitus with complication ([place])   Therapist, music at [place], NP   6 months ago DM type 2 with diabetic peripheral neuropathy ([place])   Therapist, music at [facility], [place], NP   10 months ago DM type 2 with diabetic peripheral neuropathy ([place])   Therapist, music at [facility], [place], NP   10 months ago Depression, unspecified depression type   Therapist, music at [place], NP   1 year ago Open wound of toe, initial Education administrator at [facility], [place], NP      Future Appointments            In 2 days Nafziger, Tommi Rumps, NP [facility] at [place], [place]          latanoprost (XALATAN) 0.005 % ophthalmic solution 2.5 mL 0    Sig: INSTILL 1 DROP INTO [place]     Ophthalmology:  Glaucoma Passed - 10/12/2018 10:33 AM      Passed - Valid encounter  within last 12 months    Recent Outpatient Visits          1 month ago Diabetes mellitus with complication ([place])   Therapist, music at [place], NP   6 months ago DM type 2 with diabetic peripheral neuropathy ([place])   Therapist, music at [place], NP   10 months ago DM type 2 with diabetic peripheral neuropathy ([place])   Therapist, music at [place], NP   10 months ago Depression, unspecified depression type   Therapist, music at [place], NP   1 year ago Open wound of toe, initial Education administrator at [facility], [place], NP      Future Appointments            In 2 days Nafziger, Tommi Rumps, NP [facility] at [place], [facility]

## 2018-10-12 NOTE — Telephone Encounter (Signed)
See telephone encounter from 10/12/18.

## 2018-10-13 MED ORDER — LATANOPROST 0.005 % OP SOLN: OPHTHALMIC | 0 refills | Status: DC

## 2018-10-13 NOTE — Telephone Encounter (Signed)
Done

## 2018-10-13 NOTE — Telephone Encounter (Signed)
Ok to fill eye drops for 90 days.   Klonopin was filled last week - refused

## 2018-10-14 ENCOUNTER — Telehealth: Payer: Self-pay | Admitting: Adult Health

## 2018-10-14 ENCOUNTER — Encounter: Payer: Self-pay | Admitting: Adult Health

## 2018-10-14 ENCOUNTER — Ambulatory Visit (INDEPENDENT_AMBULATORY_CARE_PROVIDER_SITE_OTHER): Payer: Medicare Other | Admitting: Adult Health

## 2018-10-14 VITALS — BP 182/86 | HR 75 | Temp 98.8°F

## 2018-10-14 DIAGNOSIS — F32A Depression, unspecified: Secondary | ICD-10-CM

## 2018-10-14 DIAGNOSIS — Z760 Encounter for issue of repeat prescription: Secondary | ICD-10-CM

## 2018-10-14 DIAGNOSIS — I63311 Cerebral infarction due to thrombosis of right middle cerebral artery: Secondary | ICD-10-CM

## 2018-10-14 DIAGNOSIS — E118 Type 2 diabetes mellitus with unspecified complications: Secondary | ICD-10-CM | POA: Diagnosis not present

## 2018-10-14 DIAGNOSIS — I10 Essential (primary) hypertension: Secondary | ICD-10-CM | POA: Diagnosis not present

## 2018-10-14 DIAGNOSIS — R7989 Other specified abnormal findings of blood chemistry: Secondary | ICD-10-CM | POA: Diagnosis not present

## 2018-10-14 DIAGNOSIS — F329 Major depressive disorder, single episode, unspecified: Secondary | ICD-10-CM | POA: Diagnosis not present

## 2018-10-14 LAB — BASIC METABOLIC PANEL
BUN: 11 mg/dL (ref 6–23)
CO2: 29 mEq/L (ref 19–32)
Calcium: 9.3 mg/dL (ref 8.4–10.5)
Chloride: 108 mEq/L (ref 96–112)
Creatinine, Ser: 1.41 mg/dL — ABNORMAL HIGH (ref 0.40–1.20)
GFR: 47.94 mL/min — ABNORMAL LOW (ref >60.00)
Glucose, Bld: 167 mg/dL — ABNORMAL HIGH (ref 70–99)
Potassium: 4.3 mEq/L (ref 3.5–5.1)
Sodium: 144 mEq/L (ref 135–145)

## 2018-10-14 LAB — CBC WITH DIFFERENTIAL/PLATELET
Basophils Absolute: 0.1 10*3/uL (ref 0.0–0.1)
Basophils Relative: 1.2 % (ref 0.0–3.0)
EOS ABS: 0.4 10*3/uL (ref 0.0–0.7)
Eosinophils Relative: 6.6 % — ABNORMAL HIGH (ref 0.0–5.0)
HCT: 33.4 % — ABNORMAL LOW (ref 36.0–46.0)
Hemoglobin: 10.3 g/dL — ABNORMAL LOW (ref 12.0–15.0)
Lymphocytes Relative: 36.9 % (ref 12.0–46.0)
Lymphs Abs: 2.3 10*3/uL (ref 0.7–4.0)
MCHC: 30.7 g/dL (ref 30.0–36.0)
MCV: 73.6 fl — ABNORMAL LOW (ref 78.0–100.0)
Monocytes Absolute: 0.2 10*3/uL (ref 0.1–1.0)
Monocytes Relative: 3.9 % (ref 3.0–12.0)
NEUTROS ABS: 3.2 10*3/uL (ref 1.4–7.7)
NEUTROS PCT: 51.4 % (ref 43.0–77.0)
Platelets: 458 10*3/uL — ABNORMAL HIGH (ref 150.0–400.0)
RBC: 4.54 Mil/uL (ref 3.87–5.11)
RDW: 24.2 % — ABNORMAL HIGH (ref 11.5–15.5)
WBC: 6.2 10*3/uL (ref 4.0–10.5)

## 2018-10-14 LAB — HEMOGLOBIN A1C: HEMOGLOBIN A1C: 7.9 % — AB (ref 4.6–6.5)

## 2018-10-14 MED ORDER — LISINOPRIL 40 MG PO TABS: 40.0000 mg | ORAL_TABLET | Freq: Every day | ORAL | 1 refills | Status: DC

## 2018-10-14 MED ORDER — CITALOPRAM HYDROBROMIDE 10 MG PO TABS: 10.0000 mg | ORAL_TABLET | Freq: Every day | ORAL | 1 refills | Status: DC

## 2018-10-14 MED ORDER — GLIMEPIRIDE 4 MG PO TABS: ORAL_TABLET | ORAL | 0 refills | Status: DC

## 2018-10-14 MED ORDER — TROLAMINE SALICYLATE 10 % EX CREA: TOPICAL_CREAM | Freq: Two times a day (BID) | CUTANEOUS | 0 refills | Status: DC | PRN

## 2018-10-14 MED ORDER — INSULIN GLARGINE 100 UNIT/ML SOLOSTAR PEN: 25.0000 [IU] | PEN_INJECTOR | Freq: Every day | SUBCUTANEOUS | 6 refills | Status: DC

## 2018-10-14 MED ORDER — CLONAZEPAM 0.5 MG PO TABS: ORAL_TABLET | ORAL | 0 refills | Status: DC

## 2018-10-14 MED ORDER — INSULIN PEN NEEDLE 31G X 5 MM MISC: 3 refills | Status: AC

## 2018-10-14 MED ORDER — COLLAGENASE 250 UNIT/GM EX OINT: TOPICAL_OINTMENT | Freq: Every day | CUTANEOUS | 2 refills | Status: DC

## 2018-10-14 MED ORDER — ATORVASTATIN CALCIUM 40 MG PO TABS: 40.0000 mg | ORAL_TABLET | Freq: Every day | ORAL | 0 refills | Status: DC

## 2018-10-14 MED ORDER — LATANOPROST 0.005 % OP SOLN: OPHTHALMIC | 0 refills | Status: DC

## 2018-10-14 MED ORDER — GLIPIZIDE 5 MG PO TABS: 5.0000 mg | ORAL_TABLET | Freq: Two times a day (BID) | ORAL | 3 refills | Status: DC

## 2018-10-14 MED ORDER — GABAPENTIN 400 MG PO CAPS: ORAL_CAPSULE | ORAL | 2 refills | Status: DC

## 2018-10-14 MED ORDER — ASPIRIN 325 MG PO TBEC: 325.0000 mg | DELAYED_RELEASE_TABLET | Freq: Every day | ORAL | 0 refills | Status: DC

## 2018-10-14 MED ORDER — GLUCOSE BLOOD VI STRP: ORAL_STRIP | 12 refills | Status: AC

## 2018-10-14 NOTE — Telephone Encounter (Signed)
Medication was sent to [facility].  Called and confirmed with the pharmacy.  Nothing further needed at this time.

## 2018-10-14 NOTE — Telephone Encounter (Signed)
Also needs an order for [facility]

## 2018-10-14 NOTE — Progress Notes (Signed)
Subjective:    Patient ID: Tara Hopkins, female    DOB: 09-04-1951, 67 y.o.   MRN: [id number]  HPI 67 year old female who  has a past medical history of Anemia, Anxiety, Bipolar 1 disorder ([place]), Cataracts, bilateral, Depression, Diabetes mellitus without complication ([place]), Glaucoma, History of blood transfusion, History of hyperbaric oxygen therapy, Hypercholesteremia, Hypertension, Peripheral neuropathy, and Stroke ([place]) (02/2015).  She presents to the office for redraw of labs and to talk about depression.  She was seen approximately 2 months ago and at that time her hemoglobin was 6.2.  She was advised to follow-up 2 weeks later for redraw.  Unfortunately she had appointments but had to cancel as her main transportation is the [employer] [facility].  She denies any rectal bleeding, any blood, or feeling acutely ill/fatigued, or short of breath.   Patient would also like to be placed back on Celexa for depression.  She stopped this medication a few months ago after she thought her depression was controlled without medication.  Unfortunately, she is unable to get out of her apartment much and usually sits at home all day watching TV.  He feels depressed because she does not feel like she can do anything d/t her disabilities and feels as though her friends want nothing to do with her.   He does report that she has had better compliance with her medications, and is checking her blood sugars at home and getting readings between 120 and 180.  Needs refills on her medications and she needs them all sent to her new pharmacy.  She is out of insulin and out of her blood pressure medications.   Review of Systems See HPI   Past Medical History:  Diagnosis Date  . Anemia   . Anxiety   . Bipolar 1 disorder ([place])   . Cataracts, bilateral   . Depression   . Diabetes mellitus without complication ([place])   . Glaucoma   . History of blood transfusion   . History of hyperbaric oxygen therapy    pt reports 80  treatments.  . Hypercholesteremia   . Hypertension   . Peripheral neuropathy   . Stroke [facility]) 02/2015    Social History   Socioeconomic History  . Marital status: Married    Spouse name: Not on file  . Number of children: 3  . Years of education: 66  . Highest education level: Not on file  Occupational History  . Occupation: Disbaled  Social Needs  . Financial resource strain: Not on file  . Food insecurity:    Worry: Not on file    Inability: Not on file  . Transportation needs:    Medical: Not on file    Non-medical: Not on file  Tobacco Use  . Smoking status: Former Smoker    Packs/day: 0.01    Years: 20.00    Pack years: 0.20    Types: Cigarettes    Last attempt to quit: 09/26/2017    Years since quitting: 1.0  . Smokeless tobacco: Never Used  Substance and Sexual Activity  . Alcohol use: Yes    Alcohol/week: 0.0 standard drinks    Comment: beer every 3 months  . Drug use: No  . Sexual activity: Not on file  Lifestyle  . Physical activity:    Days per week: Not on file    Minutes per session: Not on file  . Stress: Not on file  Relationships  . Social connections:    Talks on phone: Not  on file    Gets together: Not on file    Attends religious service: Not on file    Active member of club or organization: Not on file    Attends meetings of clubs or organizations: Not on file    Relationship status: Not on file  . Intimate partner violence:    Fear of current or ex partner: Not on file    Emotionally abused: Not on file    Physically abused: Not on file    Forced sexual activity: Not on file  Other Topics Concern  . Not on file  Social History Narrative   Lives at home alone.   Right-handed.   No caffeine use.   Oldest of 12 children     Past Surgical History:  Procedure Laterality Date  . AMPUTATION Right 12/15/2013   Procedure: Right Transmetatarsal Amputation;  Surgeon: Newt Minion, MD;  Location: [facility];  Service: Orthopedics;  Laterality:  Right;  . AMPUTATION Right 02/23/2014   Procedure: AMPUTATION FOOT;  Surgeon: Newt Minion, MD;  Location: [facility];  Service: Orthopedics;  Laterality: Right;  Right Foot Revision Transmetatarsal Amputation  . I&D EXTREMITY Right 05/23/2013   Procedure: IRRIGATION AND DEBRIDEMENT RIGHT GREAT TOE;  Surgeon: Mauri Pole, MD;  Location: [facility];  Service: Orthopedics;  Laterality: Right;    Family History  Problem Relation Age of Onset  . Liver cancer Mother   . Lung cancer Mother   . Depression Mother   . Hypertension Father   . Depression Father   . Colon cancer Neg Hx     No Known Allergies  No current facility-administered medications on file prior to visit.    Current Outpatient Medications on File Prior to Visit  Medication Sig Dispense Refill  . COD LIVER OIL PO Take 1 mg by mouth daily.    . Lurasidone HCl 60 MG TABS Take 60 mg by mouth daily.     . ONE TOUCH LANCETS MISC USE TO CHECK BLOOD SUGAR 3 TIMES DAILY 200 each 3  . zinc gluconate 50 MG tablet Take by mouth.      BP (!) 182/86   Pulse 75   Temp 98.8 F (37.1 C)   SpO2 95%       Objective:   Physical Exam  Constitutional: She appears well-developed and well-nourished. No distress.  Cardiovascular: Normal rate, regular rhythm, normal heart sounds and intact distal pulses.  Pulmonary/Chest: Effort normal and breath sounds normal.  Skin: Skin is warm and dry. She is not diaphoretic.  Psychiatric: Her speech is normal and behavior is normal. Judgment and thought content normal. Cognition and memory are normal. She exhibits a depressed mood.  Tearful throughout much of the exam   Nursing note and vitals reviewed.      Assessment & Plan:  1. Diabetes mellitus with complication ([place]) - Sample of insulin given  - CBC with Differential/Platelet - Basic Metabolic Panel - Hemoglobin A1c - Insulin Glargine (LANTUS SOLOSTAR) 100 UNIT/ML Solostar Pen; Inject 25 Units into the skin at bedtime.  Dispense: 15 mL;  Refill: 6 - Follow up in 3 months or sooner if needed  2. Essential hypertension - Not at goal. She will get her BP medications today after this visit.  - CBC with Differential/Platelet - Basic Metabolic Panel - lisinopril (PRINIVIL,ZESTRIL) 40 MG tablet; Take 1 tablet (40 mg total) by mouth daily.  Dispense: 90 tablet; Refill: 1 - Iron, TIBC and Ferritin Panel  3. Depression, unspecified  depression type - Educated on the importance of taking medication on a daily basis. This is not a medication I want her to stop when she feels "happy".  - citalopram (CELEXA) 10 MG tablet; Take 1 tablet (10 mg total) by mouth daily.  Dispense: 90 tablet; Refill: 1 - clonazePAM (KLONOPIN) 0.5 MG tablet; TAKE 1 TABLET(0.5 MG) BY MOUTH THREE TIMES DAILY (Patient taking differently: Take 0.5 mg by mouth 3 (three) times daily. )  Dispense: 90 tablet; Refill: 0 - Follow up in one month or sooner if needed - Advised to go to the ER with any thoughts of suicide or self harm  4. Abnormal CBC measurement - Consider referral to hematology/iron supplement/ further evaluation in ER  - CBC with Differential/Platelet - Basic Metabolic Panel - Iron, TIBC and Ferritin Panel  5. Medication refill  - atorvastatin (LIPITOR) 40 MG tablet; Take 1 tablet (40 mg total) by mouth daily at 6 PM.  Dispense: 90 tablet; Refill: 0 - gabapentin (NEURONTIN) 400 MG capsule; TAKE 3 CAPSULES BY MOUTH THREE TIMES A DAY (Patient taking differently: Take 2,400 mg by mouth at bedtime. TAKE 3 CAPSULES BY MOUTH THREE TIMES A DAY)  Dispense: 270 capsule; Refill: 2 - glimepiride (AMARYL) 4 MG tablet; TAKE 1 TABLET BY MOUTH EVERY DAY( NEEDS YEARLY PHYSICAL WITH Garrie Elenes) (Patient taking differently: Take 4 mg by mouth daily with breakfast. )  Dispense: 90 tablet; Refill: 0 - glipiZIDE (GLUCOTROL) 5 MG tablet; Take 1 tablet (5 mg total) by mouth 2 (two) times daily before a meal.  Dispense: 60 tablet; Refill: 3 - Insulin Glargine (LANTUS SOLOSTAR) 100  UNIT/ML Solostar Pen; Inject 25 Units into the skin at bedtime.  Dispense: 15 mL; Refill: 6 - glucose blood (ONETOUCH VERIO) test strip; USE TO TEST BLOOD GLUCOSE 3 TIMES DAILY  Dispense: 100 each; Refill: 12 - Insulin Pen Needle (B-D UF III MINI PEN NEEDLES) 31G X 5 MM MISC; Use as directed.  Dispense: 100 each; Refill: 3 - lisinopril (PRINIVIL,ZESTRIL) 40 MG tablet; Take 1 tablet (40 mg total) by mouth daily.  Dispense: 90 tablet; Refill: 1 - latanoprost (XALATAN) 0.005 % ophthalmic solution; INSTILL 1 DROP INTO BOTH EYES EVERY EVENING (Patient taking differently: Place 1 drop into both eyes at bedtime. )  Dispense: 7.5 mL; Refill: 0 - trolamine salicylate (ASPERCREME) 10 % cream; Apply topically 2 (two) times daily as needed for muscle pain.  Dispense: 85 g; Refill: 0 - collagenase (SANTYL) ointment; Apply topically daily.  Dispense: 30 g; Refill: 2 - clonazePAM (KLONOPIN) 0.5 MG tablet; TAKE 1 TABLET(0.5 MG) BY MOUTH THREE TIMES DAILY (Patient taking differently: Take 0.5 mg by mouth 3 (three) times daily. )  Dispense: 90 tablet; Refill: 0 - aspirin 325 MG EC tablet; Take 1 tablet (325 mg total) by mouth daily.  Dispense: 30 tablet; Refill: 0   [name], NP

## 2018-10-14 NOTE — Telephone Encounter (Signed)
Copied from [place] [id number]. Topic: Quick Communication - See Telephone Encounter >> Oct 14, 2018 11:04 AM Rutherford Nail, NT wrote: CRM for notification. See Telephone encounter for: 10/14/18. Patient calling and states that she would like her clonazePAM (KLONOPIN) 0.5 MG tablet sent to [place], [place]. Advised her to call the [facility] and have the prescription transferred from the [facility] that it is currently at and she said she did that and they advised her to call the office. Please advise.

## 2018-10-14 NOTE — Telephone Encounter (Signed)
Pt seen in office visit today.  She needs a manual wheelchair order faxed to [place].  She is currently unable to get her electric wheelchair due to the cost of $300.  She is having a hard time paying for her medications and also has to have retina surgery.

## 2018-10-14 NOTE — Patient Instructions (Signed)
Please call [facility] - this is an adult day program  Phone: [phone]  We will follow up with you regarding your blood work   We will try and get you accepted to [facility] which can provide additional resources  We will order you a new wheelchair

## 2018-10-14 NOTE — Telephone Encounter (Signed)
Requesting medication sent to another pharmacy. Klonopin to be sent to:  [facility] at [place], [place] [phone] (Phone) [phone] (Fax)

## 2018-10-15 LAB — IRON,TIBC AND FERRITIN PANEL
%SAT: 17 % (calc) (ref 16–45)
Ferritin: 18 ng/mL (ref 16–288)
Iron: 58 ug/dL (ref 45–160)
TIBC: 340 mcg/dL (calc) (ref 250–450)

## 2018-10-16 ENCOUNTER — Encounter (HOSPITAL_COMMUNITY): Payer: Self-pay | Admitting: Emergency Medicine

## 2018-10-16 ENCOUNTER — Emergency Department (HOSPITAL_COMMUNITY)
Admission: EM | Admit: 2018-10-16 | Discharge: 2018-10-16 | Disposition: A | Discharge status: Home / Self Care | Payer: Medicare Other | Attending: Emergency Medicine | Admitting: Emergency Medicine

## 2018-10-16 ENCOUNTER — Emergency Department (HOSPITAL_COMMUNITY): Payer: Medicare Other

## 2018-10-16 ENCOUNTER — Other Ambulatory Visit: Payer: Self-pay

## 2018-10-16 DIAGNOSIS — Z7982 Long term (current) use of aspirin: Secondary | ICD-10-CM | POA: Insufficient documentation

## 2018-10-16 DIAGNOSIS — Z79899 Other long term (current) drug therapy: Secondary | ICD-10-CM | POA: Insufficient documentation

## 2018-10-16 DIAGNOSIS — E119 Type 2 diabetes mellitus without complications: Secondary | ICD-10-CM | POA: Diagnosis not present

## 2018-10-16 DIAGNOSIS — F319 Bipolar disorder, unspecified: Secondary | ICD-10-CM | POA: Diagnosis not present

## 2018-10-16 DIAGNOSIS — R404 Transient alteration of awareness: Secondary | ICD-10-CM | POA: Diagnosis not present

## 2018-10-16 DIAGNOSIS — R0902 Hypoxemia: Secondary | ICD-10-CM | POA: Diagnosis not present

## 2018-10-16 DIAGNOSIS — S199XXA Unspecified injury of neck, initial encounter: Secondary | ICD-10-CM | POA: Diagnosis not present

## 2018-10-16 DIAGNOSIS — Z794 Long term (current) use of insulin: Secondary | ICD-10-CM | POA: Insufficient documentation

## 2018-10-16 DIAGNOSIS — R4182 Altered mental status, unspecified: Secondary | ICD-10-CM | POA: Diagnosis not present

## 2018-10-16 DIAGNOSIS — I10 Essential (primary) hypertension: Secondary | ICD-10-CM | POA: Diagnosis not present

## 2018-10-16 DIAGNOSIS — S0990XA Unspecified injury of head, initial encounter: Secondary | ICD-10-CM | POA: Diagnosis not present

## 2018-10-16 DIAGNOSIS — R000 Tachycardia, unspecified: Secondary | ICD-10-CM | POA: Diagnosis not present

## 2018-10-16 DIAGNOSIS — R410 Disorientation, unspecified: Secondary | ICD-10-CM | POA: Diagnosis not present

## 2018-10-16 DIAGNOSIS — R918 Other nonspecific abnormal finding of lung field: Secondary | ICD-10-CM | POA: Diagnosis not present

## 2018-10-16 DIAGNOSIS — Z87891 Personal history of nicotine dependence: Secondary | ICD-10-CM | POA: Diagnosis not present

## 2018-10-16 DIAGNOSIS — R569 Unspecified convulsions: Secondary | ICD-10-CM | POA: Diagnosis not present

## 2018-10-16 DIAGNOSIS — R4020 Unspecified coma: Secondary | ICD-10-CM | POA: Diagnosis not present

## 2018-10-16 LAB — CBC WITH DIFFERENTIAL/PLATELET
Abs Immature Granulocytes: 0.05 10*3/uL (ref 0.00–0.07)
Basophils Absolute: 0 10*3/uL (ref 0.0–0.1)
Basophils Relative: 0 %
Eosinophils Absolute: 0.1 10*3/uL (ref 0.0–0.5)
Eosinophils Relative: 1 %
HCT: 35.5 % — ABNORMAL LOW (ref 36.0–46.0)
HEMOGLOBIN: 9.7 g/dL — AB (ref 12.0–15.0)
Immature Granulocytes: 0 %
Lymphocytes Relative: 10 %
Lymphs Abs: 1.3 10*3/uL (ref 0.7–4.0)
MCH: 21.7 pg — ABNORMAL LOW (ref 26.0–34.0)
MCHC: 27.3 g/dL — ABNORMAL LOW (ref 30.0–36.0)
MCV: 79.4 fL — ABNORMAL LOW (ref 80.0–100.0)
Monocytes Absolute: 0.7 10*3/uL (ref 0.1–1.0)
Monocytes Relative: 5 %
NEUTROS PCT: 84 %
Neutro Abs: 11 10*3/uL — ABNORMAL HIGH (ref 1.7–7.7)
Platelets: 471 10*3/uL — ABNORMAL HIGH (ref 150–400)
RBC: 4.47 MIL/uL (ref 3.87–5.11)
RDW: 22.2 % — ABNORMAL HIGH (ref 11.5–15.5)
WBC: 13.1 10*3/uL — ABNORMAL HIGH (ref 4.0–10.5)
nRBC: 0 % (ref 0.0–0.2)

## 2018-10-16 LAB — URINALYSIS, COMPLETE (UACMP) WITH MICROSCOPIC
Bacteria, UA: NONE SEEN
Bilirubin Urine: NEGATIVE
Glucose, UA: 50 mg/dL — AB
Ketones, ur: NEGATIVE mg/dL
Leukocytes, UA: NEGATIVE
Nitrite: NEGATIVE
Protein, ur: >=300 mg/dL — AB
Specific Gravity, Urine: 1.012 (ref 1.005–1.030)
pH: 7 (ref 5.0–8.0)

## 2018-10-16 LAB — COMPREHENSIVE METABOLIC PANEL
ALT: 16 U/L (ref 0–44)
AST: 21 U/L (ref 15–41)
Albumin: 3.5 g/dL (ref 3.5–5.0)
Alkaline Phosphatase: 88 U/L (ref 38–126)
Anion gap: 12 (ref 5–15)
BUN: 6 mg/dL — ABNORMAL LOW (ref 8–23)
CALCIUM: 9.4 mg/dL (ref 8.9–10.3)
CO2: 25 mmol/L (ref 22–32)
Chloride: 104 mmol/L (ref 98–111)
Creatinine, Ser: 1.23 mg/dL — ABNORMAL HIGH (ref 0.44–1.00)
GFR calc Af Amer: 53 mL/min — ABNORMAL LOW (ref >60)
GFR calc non Af Amer: 46 mL/min — ABNORMAL LOW (ref >60)
Glucose, Bld: 180 mg/dL — ABNORMAL HIGH (ref 70–99)
Potassium: 3.4 mmol/L — ABNORMAL LOW (ref 3.5–5.1)
Sodium: 141 mmol/L (ref 135–145)
Total Bilirubin: 0.7 mg/dL (ref 0.3–1.2)
Total Protein: 7.3 g/dL (ref 6.5–8.1)

## 2018-10-16 LAB — RAPID URINE DRUG SCREEN, HOSP PERFORMED
AMPHETAMINES: NOT DETECTED
BENZODIAZEPINES: NOT DETECTED
Barbiturates: NOT DETECTED
COCAINE: POSITIVE — AB
Opiates: NOT DETECTED
Tetrahydrocannabinol: NOT DETECTED

## 2018-10-16 LAB — PROTIME-INR
INR: 1
Prothrombin Time: 13.1 seconds (ref 11.4–15.2)

## 2018-10-16 LAB — AMMONIA: Ammonia: 21 umol/L (ref 9–35)

## 2018-10-16 LAB — CBG MONITORING, ED: Glucose-Capillary: 168 mg/dL — ABNORMAL HIGH (ref 70–99)

## 2018-10-16 LAB — ETHANOL: Alcohol, Ethyl (B): <10 mg/dL (ref <10)

## 2018-10-16 LAB — TROPONIN I: TROPONIN I: 0.03 ng/mL — AB (ref <0.03)

## 2018-10-16 MED ORDER — LEVETIRACETAM IN NACL 1000 MG/100ML IV SOLN
1000.0000 mg | Freq: Once | INTRAVENOUS | Status: AC
  Administered 2018-10-16: 1000 mg via INTRAVENOUS
  Filled 2018-10-16: qty 100

## 2018-10-16 MED ORDER — LEVETIRACETAM 500 MG PO TABS: 500.0000 mg | ORAL_TABLET | Freq: Two times a day (BID) | ORAL | 1 refills | Status: DC

## 2018-10-16 NOTE — ED Notes (Signed)
Patient states she wants to go home intermittently.

## 2018-10-16 NOTE — ED Notes (Signed)
Returned from radiology. 

## 2018-10-16 NOTE — Discharge Instructions (Signed)
You were evaluated in the emergency department for a seizure.  You had a CAT scan of your head and lab tests.  Neurology recommended that we start you on some seizure medicine and you were given a dose in the emergency department.  You will need to continue the Keppra twice a day and follow-up with [facility] neurology.  Please return if any concerns.

## 2018-10-16 NOTE — ED Notes (Signed)
Patient verbalizes understanding of discharge instructions. Opportunity for questioning and answers were provided. Armband removed by staff, pt discharged from ED.  

## 2018-10-16 NOTE — ED Provider Notes (Signed)
[facility] EMERGENCY DEPARTMENT Provider Note   CSN: [id number] Arrival date & time: 10/16/18  1238     History   Chief Complaint No chief complaint on file.   HPI Tara Hopkins is a 67 y.o. female.  Level 5 caveat secondary to altered mental status.  Patient presents by ambulance after she pushed her life alert button at home.  They found her on the floor seemingly confused.  During transport she experienced about a 15-second tonic-clonic seizure.  No report of prior seizure history per EMS.  At baseline she is had a stroke and has some left-sided deficits but per neighbors was able to speak without any difficulty.  Unclear baseline.  Patient here denies any pain but is difficult to get any history from.  The history is provided by the patient and the EMS personnel.  Seizures   This is a new problem. The current episode started less than 1 hour ago. The problem has been resolved. There was 1 seizure. The most recent episode lasted 30 to 120 seconds. Characteristics include bladder incontinence, rhythmic jerking and loss of consciousness. The episode was witnessed. The seizures did not continue in the ED. The seizure(s) had no focality. There were no medications administered prior to arrival.    Past Medical History:  Diagnosis Date  . Anemia   . Anxiety   . Bipolar 1 disorder ([place])   . Cataracts, bilateral   . Depression   . Diabetes mellitus without complication ([place])   . Glaucoma   . History of blood transfusion   . History of hyperbaric oxygen therapy    pt reports 80 treatments.  . Hypercholesteremia   . Hypertension   . Peripheral neuropathy   . Stroke [facility]) 02/2015    Patient Active Problem List   Diagnosis Date Noted  . Right hand pain 01/19/2018  . History of stroke 10/05/2017  . Former smoker 10/05/2017  . Substance abuse ([place]) 10/05/2017  . Cerebrovascular accident (CVA) due to thrombosis of right middle cerebral artery ([place]) 07/23/2017    . Subcortical infarction ([place])   . Hyperlipidemia 07/22/2017  . Mild tetrahydrocannabinol (THC) abuse   . Smoker   . CVA (cerebral vascular accident) ([place]) 07/21/2017  . Ischemic stroke ([place]) 07/21/2017  . Diabetes mellitus with complication ([place]) 94/17/4081  . Slurred speech 07/21/2017  . Bipolar disorder ([place]) 09/09/2015  . Left hemiparesis ([place]) 02/20/2015  . Cocaine abuse ([place])   . Stroke ([place]) 02/15/2015  . Osteomyelitis of foot, right, acute ([place]) 02/23/2014  . Status post transmetatarsal amputation of right foot ([place]) 12/15/2013  . DM type 2 with diabetic peripheral neuropathy ([place]) 05/21/2013  . Essential hypertension 05/21/2013  . Depression 05/21/2013  . Anemia 05/21/2013  . Renal insufficiency 05/21/2013    Past Surgical History:  Procedure Laterality Date  . AMPUTATION Right 12/15/2013   Procedure: Right Transmetatarsal Amputation;  Surgeon: Newt Minion, MD;  Location: [facility];  Service: Orthopedics;  Laterality: Right;  . AMPUTATION Right 02/23/2014   Procedure: AMPUTATION FOOT;  Surgeon: Newt Minion, MD;  Location: [place];  Service: Orthopedics;  Laterality: Right;  Right Foot Revision Transmetatarsal Amputation  . I&D EXTREMITY Right 05/23/2013   Procedure: IRRIGATION AND DEBRIDEMENT RIGHT GREAT TOE;  Surgeon: Mauri Pole, MD;  Location: [facility];  Service: Orthopedics;  Laterality: Right;     OB History   None      Home Medications    Prior to Admission medications   Medication Sig  Start Date End Date Taking? Authorizing Provider  aspirin 325 MG EC tablet Take 1 tablet (325 mg total) by mouth daily. 10/14/18   Nafziger, Tommi Rumps, NP  atorvastatin (LIPITOR) 40 MG tablet Take 1 tablet (40 mg total) by mouth daily at 6 PM. 10/14/18   Nafziger, Tommi Rumps, NP  citalopram (CELEXA) 10 MG tablet Take 1 tablet (10 mg total) by mouth daily. 10/14/18 01/12/19  Nafziger, Tommi Rumps, NP  clonazePAM (KLONOPIN) 0.5 MG tablet TAKE 1 TABLET(0.5 MG) BY MOUTH THREE TIMES DAILY 10/14/18    Nafziger, Tommi Rumps, NP  COD LIVER OIL PO Take 1 mg by mouth daily.    [provider]  collagenase (SANTYL) ointment Apply topically daily. 10/14/18   Nafziger, Tommi Rumps, NP  gabapentin (NEURONTIN) 400 MG capsule TAKE 3 CAPSULES BY MOUTH THREE TIMES A DAY 10/14/18   Nafziger, Tommi Rumps, NP  glimepiride (AMARYL) 4 MG tablet TAKE 1 TABLET BY MOUTH EVERY DAY( NEEDS YEARLY PHYSICAL WITH CORY) 10/14/18   Nafziger, Tommi Rumps, NP  glipiZIDE (GLUCOTROL) 5 MG tablet Take 1 tablet (5 mg total) by mouth 2 (two) times daily before a meal. 10/14/18   Nafziger, Tommi Rumps, NP  glucose blood (ONETOUCH VERIO) test strip USE TO TEST BLOOD GLUCOSE 3 TIMES DAILY 10/14/18   Nafziger, Tommi Rumps, NP  Insulin Glargine (LANTUS SOLOSTAR) 100 UNIT/ML Solostar Pen Inject 25 Units into the skin at bedtime. 10/14/18   Nafziger, Tommi Rumps, NP  Insulin Pen Needle (B-D UF III MINI PEN NEEDLES) 31G X 5 MM MISC Use as directed. 10/14/18   Nafziger, Tommi Rumps, NP  latanoprost (XALATAN) 0.005 % ophthalmic solution INSTILL 1 DROP INTO BOTH EYES EVERY EVENING 10/14/18   Nafziger, Tommi Rumps, NP  lisinopril (PRINIVIL,ZESTRIL) 40 MG tablet Take 1 tablet (40 mg total) by mouth daily. 10/14/18   Nafziger, Tommi Rumps, NP  Lurasidone HCl 60 MG TABS Take by mouth. 07/24/13   [provider]  ONE TOUCH LANCETS MISC USE TO CHECK BLOOD SUGAR 3 TIMES DAILY 09/23/17   Nafziger, Tommi Rumps, NP  trolamine salicylate (ASPERCREME) 10 % cream Apply topically 2 (two) times daily as needed for muscle pain. 10/14/18   Nafziger, Tommi Rumps, NP  zinc gluconate 50 MG tablet Take by mouth.    [provider]    Family History Family History  Problem Relation Age of Onset  . Liver cancer Mother   . Lung cancer Mother   . Depression Mother   . Hypertension Father   . Depression Father   . Colon cancer Neg Hx     Social History Social History   Tobacco Use  . Smoking status: Former Smoker    Packs/day: 0.01    Years: 20.00    Pack years: 0.20    Types: Cigarettes    Last attempt to quit:  09/26/2017    Years since quitting: 1.0  . Smokeless tobacco: Never Used  Substance Use Topics  . Alcohol use: Yes    Alcohol/week: 0.0 standard drinks    Comment: beer every 3 months  . Drug use: No     Allergies   Patient has no known allergies.   Review of Systems Review of Systems  Unable to perform ROS: Mental status change  Genitourinary: Positive for bladder incontinence.  Neurological: Positive for seizures and loss of consciousness.     Physical Exam Updated Vital Signs BP (!) 154/64   Pulse (!) 55   Temp 98.4 F (36.9 C) (Oral)   Resp 16   Ht 5\' 4"  (1.626 m)   Wt 80 kg  SpO2 98%   BMI 30.27 kg/m   Physical Exam  Constitutional: She appears well-developed and well-nourished. No distress.  HENT:  Head: Normocephalic and atraumatic.  Eyes: Conjunctivae are normal.  Neck: No tracheal deviation present.  In cervical collar  Cardiovascular: Normal rate and regular rhythm.  No murmur heard. Pulmonary/Chest: Effort normal and breath sounds normal. No respiratory distress.  Abdominal: Soft. She exhibits no mass. There is no tenderness. There is no guarding.  Musculoskeletal: Normal range of motion. She exhibits no edema or deformity.  Right transmetatarsal amputation.  Neurological: She is alert.  Patient is awake and alert.  She is not oriented to time or place.  She is able to move her upper and lower extremities non-focally.  Grip strength with 5 out of 5.  She may be a little bit weak on her right leg versus her left but unsure if this is just her following instructions.  Skin: Skin is warm and dry. Capillary refill takes less than 2 seconds.  Psychiatric: She has a normal mood and affect.  Nursing note and vitals reviewed.    ED Treatments / Results  Labs (all labs ordered are listed, but only abnormal results are displayed) Labs Reviewed  CBC WITH DIFFERENTIAL/PLATELET - Abnormal; Notable for the following components:      Result Value   WBC 13.1  (*)    Hemoglobin 9.7 (*)    HCT 35.5 (*)    MCV 79.4 (*)    MCH 21.7 (*)    MCHC 27.3 (*)    RDW 22.2 (*)    Platelets 471 (*)    Neutro Abs 11.0 (*)    All other components within normal limits  RAPID URINE DRUG SCREEN, HOSP PERFORMED - Abnormal; Notable for the following components:   Cocaine POSITIVE (*)    All other components within normal limits  URINALYSIS, COMPLETE (UACMP) WITH MICROSCOPIC - Abnormal; Notable for the following components:   Color, Urine STRAW (*)    Glucose, UA 50 (*)    Hgb urine dipstick SMALL (*)    Protein, ur >=300 (*)    All other components within normal limits  TROPONIN I - Abnormal; Notable for the following components:   Troponin I 0.03 (*)    All other components within normal limits  COMPREHENSIVE METABOLIC PANEL - Abnormal; Notable for the following components:   Potassium 3.4 (*)    Glucose, Bld 180 (*)    BUN 6 (*)    Creatinine, Ser 1.23 (*)    GFR calc non Af Amer 46 (*)    GFR calc Af Amer 53 (*)    All other components within normal limits  CBG MONITORING, ED - Abnormal; Notable for the following components:   Glucose-Capillary 168 (*)    All other components within normal limits  ETHANOL  PROTIME-INR  AMMONIA    EKG EKG Interpretation  Date/Time:  Sunday October 16 2018 12:38:59 EST Ventricular Rate:  96 PR Interval:    QRS Duration: 90 QT Interval:  345 QTC Calculation: 436 R Axis:   43 Text Interpretation:  Sinus tachycardia Atrial premature complex Short PR interval Probable LVH with secondary repol abnrm poor baseline affects interpretation compared with prior 09/18 Confirmed by Aletta Edouard [phone]) on 10/16/2018 12:48:26 PM   Radiology Ct Head Wo Contrast  Result Date: 10/16/2018 CLINICAL DATA:  Reported patient found having tremors upper extremities with confusion. Found on the floor near her bed EXAM: CT HEAD WITHOUT CONTRAST CT CERVICAL SPINE  WITHOUT CONTRAST TECHNIQUE: Multidetector CT imaging of the head  and cervical spine was performed following the standard protocol without intravenous contrast. Multiplanar CT image reconstructions of the cervical spine were also generated. COMPARISON:  None. FINDINGS: CT HEAD FINDINGS Brain: No evidence of acute infarction, hemorrhage, extra-axial collection, ventriculomegaly, or mass effect. Generalized cerebral atrophy. Periventricular white matter low attenuation likely secondary to microangiopathy. Vascular: Cerebrovascular atherosclerotic calcifications are noted. Skull: Negative for fracture or focal lesion. Sinuses/Orbits: Visualized portions of the orbits are unremarkable. Visualized portions of the paranasal sinuses and mastoid air cells are unremarkable. Other: None. CT CERVICAL SPINE FINDINGS Alignment: Normal. Skull base and vertebrae: No acute fracture. No primary bone lesion or focal pathologic process. Soft tissues and spinal canal: No prevertebral fluid or swelling. No visible canal hematoma. Disc levels: Degenerative disc disease disc height loss at C5-6. Broad-based disc bulge at C4-5. Mild left facet arthropathy at C2-3. Moderate right facet arthropathy at C3-4 with right foraminal stenosis. Moderate left facet arthropathy at C4-5 with mild left foraminal stenosis. Moderate right facet arthropathy at C5-6. Upper chest: Lung apices are clear. Other: Bilateral carotid artery atherosclerosis. Right paratracheal lymphadenopathy measuring up to 2 cm in short axis. IMPRESSION: 1. No acute intracranial pathology. 2.  No acute osseous injury of the cervical spine. 3. Nonspecific right paratracheal lymphadenopathy. Appearance is most concerning for malignancy such as lymphoma or metastatic disease until proven otherwise. Recommend further evaluation with a nonemergent CT of the chest. Electronically Signed   By: Kathreen Devoid   On: 10/16/2018 14:23   Ct Cervical Spine Wo Contrast  Result Date: 10/16/2018 CLINICAL DATA:  Reported patient found having tremors upper  extremities with confusion. Found on the floor near her bed EXAM: CT HEAD WITHOUT CONTRAST CT CERVICAL SPINE WITHOUT CONTRAST TECHNIQUE: Multidetector CT imaging of the head and cervical spine was performed following the standard protocol without intravenous contrast. Multiplanar CT image reconstructions of the cervical spine were also generated. COMPARISON:  None. FINDINGS: CT HEAD FINDINGS Brain: No evidence of acute infarction, hemorrhage, extra-axial collection, ventriculomegaly, or mass effect. Generalized cerebral atrophy. Periventricular white matter low attenuation likely secondary to microangiopathy. Vascular: Cerebrovascular atherosclerotic calcifications are noted. Skull: Negative for fracture or focal lesion. Sinuses/Orbits: Visualized portions of the orbits are unremarkable. Visualized portions of the paranasal sinuses and mastoid air cells are unremarkable. Other: None. CT CERVICAL SPINE FINDINGS Alignment: Normal. Skull base and vertebrae: No acute fracture. No primary bone lesion or focal pathologic process. Soft tissues and spinal canal: No prevertebral fluid or swelling. No visible canal hematoma. Disc levels: Degenerative disc disease disc height loss at C5-6. Broad-based disc bulge at C4-5. Mild left facet arthropathy at C2-3. Moderate right facet arthropathy at C3-4 with right foraminal stenosis. Moderate left facet arthropathy at C4-5 with mild left foraminal stenosis. Moderate right facet arthropathy at C5-6. Upper chest: Lung apices are clear. Other: Bilateral carotid artery atherosclerosis. Right paratracheal lymphadenopathy measuring up to 2 cm in short axis. IMPRESSION: 1. No acute intracranial pathology. 2.  No acute osseous injury of the cervical spine. 3. Nonspecific right paratracheal lymphadenopathy. Appearance is most concerning for malignancy such as lymphoma or metastatic disease until proven otherwise. Recommend further evaluation with a nonemergent CT of the chest. Electronically  Signed   By: Kathreen Devoid   On: 10/16/2018 14:23   Dg Chest Port 1 View  Result Date: 10/16/2018 CLINICAL DATA:  Confusion.  Found down. EXAM: PORTABLE CHEST 1 VIEW COMPARISON:  12/15/2013 FINDINGS: The heart is mildly enlarged. Prominent  right paratracheal density is likely prominent vasculature. No infiltrates, edema or effusions. The bony thorax is intact. IMPRESSION: Mild cardiac enlargement. Prominent right paratracheal density, likely dilated or tortuous vascularity but recommend an upright PA and lateral chest when able for further evaluation. Electronically Signed   By: Marijo Sanes M.D.   On: 10/16/2018 13:15    Procedures Procedures (including critical care time)  Medications Ordered in ED Medications  levETIRAcetam (KEPPRA) IVPB 1000 mg/100 mL premix (0 mg Intravenous Stopped 10/16/18 1640)     Initial Impression / Assessment and Plan / ED Course  I have reviewed the triage vital signs and the nursing notes.  Pertinent labs & imaging results that were available during my care of the patient were reviewed by me and considered in my medical decision making (see chart for details).  Clinical Course as of Oct 18 923  Sun Oct 16, 2018  1500 Reevaluated patient and family members are here now.  She is much more alert since and fairly angry about being here and wants to be discharged.  Her daughter states she has a history of stroke in the past that left her with some residual deficits that she needed therapy for but she was not sure what side they were on of her body.  She does not recall the patient ever having had a seizure.  The patient denies having seized and think she is fine and she should go home now.  Awaiting the results of her imaging and blood work.   [MB]  1509 Was able to see in a note from a year ago that she had a stroke with left-sided deficits and speech deficits.  Her imaging here does not show any acute findings.  Waiting for the rest of her labs to come back and  then will review this with neurology.    [MB]  0349 Reviewed the case with neurology Dr. Malen Gauze who recommends that we IV load the patient with 1 g of IV Keppra and send her home with a prescription of 500 twice daily of Keppra and have her follow-up with [facility] neurology who she had seen in the past.   [MB]    Clinical Course User Index [MB] Hayden Rasmussen, MD     Final Clinical Impressions(s) / ED Diagnoses   Final diagnoses:  Seizure [facility])    ED Discharge Orders         Ordered    levETIRAcetam (KEPPRA) 500 MG tablet  2 times daily     10/16/18 1557           Hayden Rasmussen, MD 10/17/18 [phone]

## 2018-10-16 NOTE — ED Triage Notes (Signed)
Arrived via EMS patient pushed her life alert button. Lives at home alone EMS reported patient found having tremors upper extremities with confusion. Found on the floor near her bed. While with EMS patient had a grand mal seizure lasting 30-45 seconds. Post ictal for 10 minutes with repetitive answers. While in parking lot of ED EMS report patient alert following commands appropriate with some confusion.

## 2018-10-16 NOTE — ED Notes (Signed)
Lab called critical value troponin 0.03 Doctor notified.

## 2018-10-16 NOTE — ED Notes (Signed)
Patient has small amount of clear emesis. Airway intact bilateral equal chest rise and fall.

## 2018-10-17 ENCOUNTER — Ambulatory Visit: Payer: Self-pay | Admitting: *Deleted

## 2018-10-17 NOTE — Telephone Encounter (Signed)
[place]

## 2018-10-17 NOTE — Telephone Encounter (Signed)
Hospital follow up needed- patient reports her R hand is weaker and she is having more pain and numbness in it since she had the seizure.She is having more trouble controlling her chair now. She needs her referral for pain management. Call to office-Patient scheduled for hospital follow up. Patient to call back if she gets worse.  Reason for Disposition . [1] Weakness of arm / hand, or leg / foot AND [2] is a chronic symptom (recurrent or ongoing AND present > 4 weeks)    Weakness is worse now and patient states it is hard for her to control her chair now.  Answer Assessment - Initial Assessment Questions 1. SYMPTOM: "What is the main symptom you are concerned about?" (e.g., weakness, numbness)     Patient states she has been numb in her R hand since yesterday at hospital 2. ONSET: "When did this start?" (minutes, hours, days; while sleeping)     Before she went to the hospital- she has numbness and pain 3. LAST NORMAL: "When was the last time you were normal (no symptoms)?"     Before yesterday 4. PATTERN "Does this come and go, or has it been constant since it started?"  "Is it present now?"     constant 5. CARDIAC SYMPTOMS: "Have you had any of the following symptoms: chest pain, difficulty breathing, palpitations?"     no 6. NEUROLOGIC SYMPTOMS: "Have you had any of the following symptoms: headache, dizziness, vision loss, double vision, changes in speech, unsteady on your feet?"     no 7. OTHER SYMPTOMS: "Do you have any other symptoms?"     Numbness/pain in R hand 8. PREGNANCY: "Is there any chance you are pregnant?" "When was your last menstrual period?"     n/a  Protocols used: NEUROLOGIC DEFICIT-A-AH

## 2018-10-19 ENCOUNTER — Encounter: Payer: Self-pay | Admitting: Family Medicine

## 2018-10-19 NOTE — Telephone Encounter (Signed)
Order faxed and referral placed.

## 2018-10-19 NOTE — Telephone Encounter (Signed)
Received confirmation the fax was successful.  Nothing further needed.

## 2018-10-19 NOTE — Telephone Encounter (Signed)
Faxed and was not successful.  Will refax order.

## 2018-10-19 NOTE — Telephone Encounter (Signed)
Tara Hopkins 10/19/2018 08:24 AM  Summary: wheel chair order     Pt called In this morning stating that she called [facility] about her wheel chair order and she was advised of no orders in place for her chair/ pleas advise

## 2018-10-20 ENCOUNTER — Inpatient Hospital Stay: Payer: Self-pay | Admitting: Adult Health

## 2018-10-20 ENCOUNTER — Telehealth: Payer: Self-pay | Admitting: Adult Health

## 2018-10-20 DIAGNOSIS — G894 Chronic pain syndrome: Secondary | ICD-10-CM

## 2018-10-20 DIAGNOSIS — R590 Localized enlarged lymph nodes: Secondary | ICD-10-CM

## 2018-10-20 NOTE — Telephone Encounter (Signed)
Spoke to patient on the phone, she had an appointment today but cancelled it after being seen in the ER for suspected seizure. She does not feel as though she had a seizure and reports that she fell out of bed. She does not want to come in for an office visit at this time.   I updated her on her labs   She would like a  Referral to pain management for chronic pain   There was also a concern on her CT of cervical spine that was done in the ER, which showed:   Nonspecific right paratracheal lymphadenopathy. Appearance is most concerning for malignancy such as lymphoma or metastatic disease until proven otherwise. Recommend further evaluation with a nonemergent CT of the chest.  She is ok with having CT of chest

## 2018-10-26 ENCOUNTER — Other Ambulatory Visit: Payer: Self-pay

## 2018-10-28 ENCOUNTER — Ambulatory Visit: Payer: Medicare Other | Admitting: Podiatry

## 2018-10-28 ENCOUNTER — Ambulatory Visit
Admission: RE | Admit: 2018-10-28 | Discharge: 2018-10-28 | Disposition: A | Discharge status: Home / Self Care | Payer: Medicare Other | Source: Ambulatory Visit | Attending: Adult Health | Admitting: Adult Health

## 2018-10-28 DIAGNOSIS — R590 Localized enlarged lymph nodes: Secondary | ICD-10-CM

## 2018-10-28 DIAGNOSIS — R599 Enlarged lymph nodes, unspecified: Secondary | ICD-10-CM | POA: Diagnosis not present

## 2018-10-28 MED ORDER — IOHEXOL 300 MG/ML  SOLN
75.0000 mL | Freq: Once | INTRAMUSCULAR | Status: AC | PRN
  Administered 2018-10-28: 75 mL via INTRAVENOUS

## 2018-10-31 ENCOUNTER — Telehealth: Payer: Self-pay | Admitting: Adult Health

## 2018-10-31 NOTE — Telephone Encounter (Signed)
Tara Hopkins, Radiology Assistant from [facility] called with report for CT chest 10/28/18: bulky mediatstinal lymphamandopathy compatible with metastatic disease or lymphoma; posterior right upper lobe nodule with adjacent satellite nodules; neoplasm not excluded; PET CT may prove helpful to further evaluate; read by Dr Tery Sanfilippo; results repeated for correctness; [name] also states that the report is ready for review in Epic; the pt is normally seen by [name], [facility]; spoke with [name] and she requests that that this information be routed to office and she will give the information to [name].

## 2018-11-03 ENCOUNTER — Telehealth: Payer: Self-pay | Admitting: Adult Health

## 2018-11-03 ENCOUNTER — Other Ambulatory Visit: Payer: Self-pay | Admitting: Adult Health

## 2018-11-03 DIAGNOSIS — R9389 Abnormal findings on diagnostic imaging of other specified body structures: Secondary | ICD-10-CM

## 2018-11-03 DIAGNOSIS — R918 Other nonspecific abnormal finding of lung field: Secondary | ICD-10-CM

## 2018-11-03 DIAGNOSIS — C8590 Non-Hodgkin lymphoma, unspecified, unspecified site: Secondary | ICD-10-CM

## 2018-11-03 NOTE — Telephone Encounter (Signed)
Left VM to call back to discuss CT results

## 2018-11-03 NOTE — Telephone Encounter (Signed)
Spoke to patient and informed her of CT results  1. Bulky mediastinal lymphadenopathy. Imaging features compatible with metastatic disease or lymphoma. 2. 10 mm posterior right upper lobe nodule with adjacent satellite nodules. Neoplasm not excluded  She was tearful on the phone. All questions answered.   Will refer to pulmonary and order PET scan

## 2018-11-04 NOTE — Telephone Encounter (Signed)
Patient is calling to see if Tara Hopkins is calling to call her to notify her where to go for further testing? Please advise [phone]

## 2018-11-07 DIAGNOSIS — M79662 Pain in left lower leg: Secondary | ICD-10-CM | POA: Diagnosis not present

## 2018-11-10 ENCOUNTER — Telehealth: Payer: Self-pay

## 2018-11-10 ENCOUNTER — Ambulatory Visit (INDEPENDENT_AMBULATORY_CARE_PROVIDER_SITE_OTHER): Payer: Medicare Other | Admitting: Internal Medicine

## 2018-11-10 ENCOUNTER — Encounter: Payer: Self-pay | Admitting: Internal Medicine

## 2018-11-10 VITALS — BP 138/80 | HR 77 | Ht 64.0 in | Wt 167.0 lb

## 2018-11-10 DIAGNOSIS — Z87891 Personal history of nicotine dependence: Secondary | ICD-10-CM | POA: Diagnosis not present

## 2018-11-10 DIAGNOSIS — R918 Other nonspecific abnormal finding of lung field: Secondary | ICD-10-CM | POA: Diagnosis not present

## 2018-11-10 NOTE — Patient Instructions (Signed)
I will call you after the PET scan to review it with you and decide on the next step

## 2018-11-10 NOTE — Progress Notes (Signed)
Tara Hopkins, female    DOB: 1951/06/03,   MRN: [id number]    Brief patient profile:  51 yobf quit smoking 10/17/18 and is   w/c  bound   since around 2015  Due to PVD/neuropathy related to dm with episode where fell and couldn't get up > [facility] eval 10/16/18  for ? SZ (though no loc or ams assoc with spell per pt but note UDS pos cocaine)  > abn cxr > CT chest 10/28/18 > pet ordered for 11/16/18 and referred to pulmonary clinic 11/10/2018 by [name] NP    History of Present Illness  11/10/2018  Pulmonary/ 1st office eval/Merilyn Pagan  Chief Complaint  Patient presents with  . Pulmonary Consult    Referred by [name] for eval of abnormal ct chest. She states she has developed a cold approx 2 wks ago and has had some non prod cough.   Dyspnea:  Not limited by breathing from desired activities  But w/c bound  Cough: with cold x several weeks / congested rattling nothing comes up "just a usual cold" denies chronic cough   Sleep: able to lie flat  SABA use: none  Past Medical History:  Diagnosis Date  . Anemia   . Anxiety   . Bipolar 1 disorder ([place])   . Cataracts, bilateral   . Depression   . Diabetes mellitus without complication ([place])   . Glaucoma   . History of blood transfusion   . History of hyperbaric oxygen therapy    pt reports 80 treatments.  . Hypercholesteremia   . Hypertension   . Peripheral neuropathy   . Stroke [facility]) 02/2015    Outpatient Medications Prior to Visit  Medication Sig Dispense Refill  . aspirin 325 MG EC tablet Take 1 tablet (325 mg total) by mouth daily. 30 tablet 0  . atorvastatin (LIPITOR) 40 MG tablet Take 1 tablet (40 mg total) by mouth daily at 6 PM. 90 tablet 0  . citalopram (CELEXA) 10 MG tablet Take 1 tablet (10 mg total) by mouth daily. 90 tablet 1  . clonazePAM (KLONOPIN) 0.5 MG tablet TAKE 1 TABLET(0.5 MG) BY MOUTH THREE TIMES DAILY (Patient taking differently: Take 0.5 mg by mouth 3 (three) times daily. ) 90 tablet 0  . COD LIVER OIL PO  Take 1 mg by mouth daily.    Marland Kitchen gabapentin (NEURONTIN) 400 MG capsule TAKE 3 CAPSULES BY MOUTH THREE TIMES A DAY (Patient taking differently: Take 2,400 mg by mouth at bedtime. TAKE 3 CAPSULES BY MOUTH THREE TIMES A DAY) 270 capsule 2  . glimepiride (AMARYL) 4 MG tablet TAKE 1 TABLET BY MOUTH EVERY DAY( NEEDS YEARLY PHYSICAL WITH CORY) (Patient taking differently: Take 4 mg by mouth daily with breakfast. ) 90 tablet 0  . glipiZIDE (GLUCOTROL) 5 MG tablet Take 1 tablet (5 mg total) by mouth 2 (two) times daily before a meal. 60 tablet 3  . glucose blood (ONETOUCH VERIO) test strip USE TO TEST BLOOD GLUCOSE 3 TIMES DAILY 100 each 12  . Insulin Glargine (LANTUS SOLOSTAR) 100 UNIT/ML Solostar Pen Inject 25 Units into the skin at bedtime. 15 mL 6  . Insulin Pen Needle (B-D UF III MINI PEN NEEDLES) 31G X 5 MM MISC Use as directed. 100 each 3  . latanoprost (XALATAN) 0.005 % ophthalmic solution INSTILL 1 DROP INTO BOTH EYES EVERY EVENING (Patient taking differently: Place 1 drop into both eyes at bedtime. ) 7.5 mL 0  . lisinopril (PRINIVIL,ZESTRIL) 40 MG tablet  Take 1 tablet (40 mg total) by mouth daily. 90 tablet 1  . ONE TOUCH LANCETS MISC USE TO CHECK BLOOD SUGAR 3 TIMES DAILY 200 each 3  . zinc gluconate 50 MG tablet Take by mouth.    . collagenase (SANTYL) ointment Apply topically daily. (Patient not taking: Reported on 11/10/2018) 30 g 2  . levETIRAcetam (KEPPRA) 500 MG tablet Take 1 tablet (500 mg total) by mouth 2 (two) times daily. (Patient not taking: Reported on 11/10/2018) 60 tablet 1  . Lurasidone HCl 60 MG TABS Take 60 mg by mouth daily.     Marland Kitchen trolamine salicylate (ASPERCREME) 10 % cream Apply topically 2 (two) times daily as needed for muscle pain. 85 g 0      Objective:     BP 138/80 (BP Location: Left Arm, Cuff Size: Normal)   Pulse 77   Ht 5\' 4"  (1.626 m)   Wt 167 lb (75.8 kg)   SpO2 97%   BMI 28.67 kg/m   SpO2: 97 %   RA   wc bound wf somewhat tearful   HEENT:  No upper teeth,  few lower teeth poor shape, nl  turbinates bilaterally, and oropharynx. Nl external ear canals without cough reflex   NECK :  without JVD/Nodes/TM/ nl carotid upstrokes bilaterally   LUNGS: no acc muscle use,  Nl contour chest which is clear to A and P bilaterally without cough on insp or exp maneuvers   CV:  RRR  no s3 or murmur or increase in P2, and trace edema both le's  ABD:  Obese soft and nontender with limited inspiratory excursion   No bruits or organomegaly appreciated, bowel sounds nl  MS:    ext warm without deformities, calf tenderness, cyanosis or clubbing No obvious joint restrictions   SKIN: warm and dry without lesions    NEURO:  alert, approp, nl sensorium with  no motor or cerebellar deficits apparent.     I personally reviewed images and agree with radiology impression as follows:   Chest CT  10/28/18 with contrast: Bulky mediastinal lymphadenopathy. Imaging features compatible with metastatic disease or lymphoma. 2. 10 mm posterior right upper lobe nodule with adjacent satellite nodules.       Assessment   No problem-specific Assessment & Plan notes found for this encounter.     [name], MD 11/10/2018

## 2018-11-10 NOTE — Telephone Encounter (Signed)
Copied from [place] [id number]. Topic: Appointment Scheduling - Scheduling Inquiry for Clinic >> Nov 10, 2018 11:44 AM Tara Hopkins, NT wrote: Reason for CRM: patient is calling and states she fell lastnight 11/09/18 and the EMT that came out told her that she had a strong odor to her urine. Patient requested an appointment for next week I offered Monday with Dr. Sarajane Jews since Tommi Rumps was out of office or another day with [facility]. Patient states she wants Tommi Rumps to call her because she wants to be referred to a doctor. Please advise.

## 2018-11-11 ENCOUNTER — Encounter: Payer: Self-pay | Admitting: Internal Medicine

## 2018-11-11 NOTE — Assessment & Plan Note (Addendum)
Congratulated on stopping .  Counseled re importance of maintaining smoking cessation but did not meet criteria  for separate billing     Total time devoted to counseling  > 50 % of initial 60 min office visit:  review case with pt/ discussion of options/alternatives/ personally creating written customized instructions  in presence of pt  then going over those specific  Instructions directly with the pt including how to use all of the meds but in particular covering each new medication in detail and the difference between the maintenance= "automatic" meds and the prns using an action plan format for the latter (If this problem/symptom => do that organization reading Left to right).  Please see AVS from this visit for a full list of these instructions which I personally wrote for this pt and  are unique to this visit.

## 2018-11-11 NOTE — Assessment & Plan Note (Addendum)
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 Accurate with poor f/v conture    In all likelihood this is unresectable lung ca and just need to get a tissue dx with the least invasive means - the adenopathy is so bulky may be able to just do a quick Wang bx with putting her to sleep for EBUS but PET may yield other options so rec wait on this before making a final decision   Dian Situ out in black ink using "cartoon" graphics the way the nl tracheal/bronchial tree looks and superimposed in red the findings on her CT chest.   Discussed in detail all the  indications, usual  risks and alternatives  relative to the benefits with patient who agrees to proceed with w/u as outlined.

## 2018-11-11 NOTE — Telephone Encounter (Signed)
Spoke to the pt.  She informed me that she is is urinating normally.  She had one episode of incontinence and she said the EMT told her that she had a strong odor to her urine and that he thought she should see Tommi Rumps.  Pt denied chills, fever, abdominal pain, back pain, flank pain and dysuria.  Advised if she started to experience any of those sx then she should call and schedule an appt to see [facility].  Pt agreed to plan.  Will forward to [facility] as [place].  Nothing further needed at this time.

## 2018-11-14 ENCOUNTER — Ambulatory Visit: Payer: Self-pay | Admitting: Family Medicine

## 2018-11-16 ENCOUNTER — Telehealth: Payer: Self-pay | Admitting: Family Medicine

## 2018-11-16 ENCOUNTER — Ambulatory Visit (HOSPITAL_COMMUNITY): Payer: Medicare Other

## 2018-11-16 NOTE — Telephone Encounter (Signed)
Deb, can you give pt the telephone number to reschedule?

## 2018-11-16 NOTE — Telephone Encounter (Signed)
Copied from [place]. Topic: Referral - Status >> Nov 16, 2018  9:06 AM Reyne Dumas L wrote: Reason for CRM:   Pt states she was referred by Tommi Rumps to have a scan done today at [facility].  Pt missed the [facility] bus because she fell.  Pt states that she needs to have her appointment rescheduled.  Pt states that she can not do the 8am appointments because she is disabled and has to get her wheelchair and everything ready. Pt states that she needs advanced notice of all of this and would like a call back. Pt can be reached at [phone]

## 2018-11-18 ENCOUNTER — Telehealth: Payer: Self-pay | Admitting: Adult Health

## 2018-11-18 NOTE — Telephone Encounter (Signed)
Copied from [place] [id number]. Topic: General - Other >> Nov 18, 2018  2:53 PM Keene Breath wrote: Reason for CRM: Patient called to speak with the nurse or doctor regarding her referral.  Patient was not able to make the scheduled appointment at [facility] because she had fallen on that day.  She would like another appointment to be made after 10 am.  Please advise and call patient back.  CB# [phone]

## 2018-11-18 NOTE — Telephone Encounter (Signed)
Deb, can you give the pt the telephone number to reschedule or give to me and I will call.

## 2018-11-22 NOTE — Telephone Encounter (Signed)
Pt called back to give the times and days she can make an appt.  Advised pt I could give her the number so that she could make her own appt as provider in note.  Pt stated she cannot call because she is handicapped. I transferred the call for her, and made sure they were on the phone before I hung up

## 2018-11-22 NOTE — Telephone Encounter (Signed)
Called pt no answer left a message on VM to call our office back.  I can provide her with the number to reschedule her appt it is [phone] awaiting on pt to return my call

## 2018-11-22 NOTE — Telephone Encounter (Signed)
Called pt no answer let a message on VM to call our office back I can provide her with the number to reschedule her appt it is [phone]

## 2018-11-29 ENCOUNTER — Telehealth: Payer: Self-pay

## 2018-11-29 DIAGNOSIS — Z79899 Other long term (current) drug therapy: Secondary | ICD-10-CM | POA: Diagnosis not present

## 2018-11-29 DIAGNOSIS — G629 Polyneuropathy, unspecified: Secondary | ICD-10-CM | POA: Diagnosis not present

## 2018-11-29 DIAGNOSIS — E559 Vitamin D deficiency, unspecified: Secondary | ICD-10-CM | POA: Diagnosis not present

## 2018-11-29 DIAGNOSIS — G894 Chronic pain syndrome: Secondary | ICD-10-CM | POA: Diagnosis not present

## 2018-11-29 DIAGNOSIS — M129 Arthropathy, unspecified: Secondary | ICD-10-CM | POA: Diagnosis not present

## 2018-11-29 NOTE — Telephone Encounter (Signed)
Copied from [place]. Topic: Referral - Status >> Nov 29, 2018 11:07 AM Scherrie Gerlach wrote: .Reason for CRM: pt states she just left [facility] [facility] appt and they advised her of some issues that she was unaware of.  Pt states she thinks Tommi Rumps should have let her know about these issues, and she would like Cory to give her a call.

## 2018-11-30 ENCOUNTER — Other Ambulatory Visit: Payer: Self-pay | Admitting: Adult Health

## 2018-11-30 DIAGNOSIS — F32A Depression, unspecified: Secondary | ICD-10-CM

## 2018-11-30 DIAGNOSIS — F329 Major depressive disorder, single episode, unspecified: Secondary | ICD-10-CM

## 2018-11-30 DIAGNOSIS — Z760 Encounter for issue of repeat prescription: Secondary | ICD-10-CM

## 2018-12-01 ENCOUNTER — Ambulatory Visit: Payer: Self-pay

## 2018-12-01 ENCOUNTER — Ambulatory Visit (HOSPITAL_COMMUNITY)
Admission: RE | Admit: 2018-12-01 | Discharge: 2018-12-01 | Disposition: A | Discharge status: Home / Self Care | Payer: Medicare Other | Source: Ambulatory Visit | Attending: Adult Health | Admitting: Adult Health

## 2018-12-01 ENCOUNTER — Telehealth: Payer: Self-pay | Admitting: Adult Health

## 2018-12-01 DIAGNOSIS — C8590 Non-Hodgkin lymphoma, unspecified, unspecified site: Secondary | ICD-10-CM | POA: Diagnosis not present

## 2018-12-01 DIAGNOSIS — R9389 Abnormal findings on diagnostic imaging of other specified body structures: Secondary | ICD-10-CM | POA: Insufficient documentation

## 2018-12-01 LAB — GLUCOSE, CAPILLARY: Glucose-Capillary: 68 mg/dL — ABNORMAL LOW (ref 70–99)

## 2018-12-01 MED ORDER — FLUDEOXYGLUCOSE F - 18 (FDG) INJECTION
9.3500 | Freq: Once | INTRAVENOUS | Status: AC
  Administered 2018-12-01: 9.35 via INTRAVENOUS

## 2018-12-01 NOTE — Telephone Encounter (Signed)
Attempted to return call to patient after she attemped to call us back. Left VM to return call.  Pt returned call.  See triage note

## 2018-12-01 NOTE — Telephone Encounter (Signed)
Attempted to call patient .  Left VM to please call office.

## 2018-12-01 NOTE — Telephone Encounter (Unsigned)
Copied from [place] [id number]. Topic: General - Other >> Dec 01, 2018 10:09 AM Tara Hopkins wrote: Reason for CRM: Pt stated she would like to increase the amount of gabapentin that she is taking. Pt stated she needs to speak with [name] about increasing the medication. Pt requests call back.

## 2018-12-01 NOTE — Telephone Encounter (Signed)
  Pt daughter called back and Ms Niccoli gave verbal permission for me to speak to her. Pt was referred to pain clinic for neuropathy pain. Per daughter pain clinic was unable to help patient because of her hx of kidney issues. Pt is requesting to increase her gabapentin. Pt rates her pain at 10.  She has pain to both hands and both feet. 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 She has numbness to her hands and feet. Pain has increase over several weeks and pt is currently unable to have relief. Appointment scheduled per protocol. Care advice read to daughter. Daughter verbalized understanding of all instructions. Reason for Disposition . Numbness (i.e., loss of sensation) in hand or fingers (Exception: just tingling; numbness present > 2 weeks)  Answer Assessment - Initial Assessment Questions 1. ONSET: "When did the pain start?"     onging 2. LOCATION: "Where is the pain located?"     Both feel and hands and wrist 3. PAIN: "How bad is the pain?" (Scale 1-10; or mild, moderate, severe)   - MILD (1-3): doesn't interfere with normal activities   - MODERATE (4-7): interferes with normal activities (e.g., work or school) or awakens from sleep   - SEVERE (8-10): excruciating pain, unable to use hand at all     10 4. WORK OR EXERCISE: "Has there been any recent work or exercise that involved this part of the body?"     no 5. CAUSE: "What do you think is causing the pain?"     neuropathy 6. AGGRAVATING FACTORS: "What makes the pain worse?" (e.g., using computer)     no 7. OTHER SYMPTOMS: "Do you have any other symptoms?" (e.g., neck pain, swelling, rash, numbness, fever)    Numbness to hands and feet 8. PREGNANCY: "Is there any chance you are pregnant?" "When was your last menstrual period?"     N/A  Protocols used: HAND AND WRIST PAIN-A-AH

## 2018-12-02 ENCOUNTER — Ambulatory Visit: Payer: Medicare Other | Admitting: Adult Health

## 2018-12-02 DIAGNOSIS — Z0289 Encounter for other administrative examinations: Secondary | ICD-10-CM

## 2018-12-02 NOTE — Telephone Encounter (Signed)
I have not seen this message. Would you have time to give her a call and see what she is talking about?

## 2018-12-02 NOTE — Telephone Encounter (Signed)
I called the pt and she stated she did not come in for the appt today as her daughter was in an MVA and could not come with her.  She stated the doctor at the pain management clinic told her she only had one kidney, asked why Tara Hopkins did not treat her for pain and was told they would not treat this.  Message sent to [facility].

## 2018-12-05 NOTE — Progress Notes (Signed)
LMTCB

## 2018-12-09 ENCOUNTER — Ambulatory Visit: Payer: Medicare Other | Admitting: Podiatry

## 2018-12-15 ENCOUNTER — Telehealth: Payer: Self-pay | Admitting: Pulmonary Disease

## 2018-12-15 ENCOUNTER — Ambulatory Visit: Payer: Self-pay | Admitting: Pulmonary Disease

## 2018-12-15 NOTE — Telephone Encounter (Signed)
[place] noted. Thanks for sending.   Aaron Edelman

## 2018-12-15 NOTE — Telephone Encounter (Signed)
PCCM:  I called and spoke with the patient. She was scheduled to see me today for bronchoscopy evaluation . She was unable to get transport today due to the weather.   I have reviewed the images and I think she needs an expedited evaluation.   We will schedule her for bronchoscopy on Thursday afternoon.   We will schedule her for bronchoscopy follow up   [name], [facility] 12/15/2018 3:55 PM

## 2018-12-16 ENCOUNTER — Telehealth: Payer: Self-pay | Admitting: Family Medicine

## 2018-12-16 ENCOUNTER — Telehealth: Payer: Self-pay | Admitting: Pulmonary Disease

## 2018-12-16 NOTE — Telephone Encounter (Signed)
Copied from [place] [phone]. Topic: General - Inquiry >> Dec 16, 2018 10:00 AM Ahmed Prima L wrote: Reason for YOF:[id number] states she was just diagnosed with Lung cancer and would like to speak with [facility].

## 2018-12-16 NOTE — Telephone Encounter (Signed)
lmtcb x1 pt 

## 2018-12-19 ENCOUNTER — Ambulatory Visit (INDEPENDENT_AMBULATORY_CARE_PROVIDER_SITE_OTHER): Payer: Medicare Other | Admitting: Nurse Practitioner

## 2018-12-19 ENCOUNTER — Encounter: Payer: Self-pay | Admitting: Nurse Practitioner

## 2018-12-19 VITALS — BP 164/90 | HR 86 | Ht 65.0 in | Wt 167.2 lb

## 2018-12-19 DIAGNOSIS — R918 Other nonspecific abnormal finding of lung field: Secondary | ICD-10-CM | POA: Diagnosis not present

## 2018-12-19 DIAGNOSIS — I639 Cerebral infarction, unspecified: Secondary | ICD-10-CM | POA: Diagnosis not present

## 2018-12-19 DIAGNOSIS — Z01818 Encounter for other preprocedural examination: Secondary | ICD-10-CM | POA: Diagnosis not present

## 2018-12-19 LAB — CBC WITH DIFFERENTIAL/PLATELET
BASOS PCT: 0.7 % (ref 0.0–3.0)
Basophils Absolute: 0 10*3/uL (ref 0.0–0.1)
Eosinophils Absolute: 0.2 10*3/uL (ref 0.0–0.7)
Eosinophils Relative: 2.4 % (ref 0.0–5.0)
HCT: 33.9 % — ABNORMAL LOW (ref 36.0–46.0)
Hemoglobin: 10.7 g/dL — ABNORMAL LOW (ref 12.0–15.0)
Lymphocytes Relative: 27.3 % (ref 12.0–46.0)
Lymphs Abs: 1.9 10*3/uL (ref 0.7–4.0)
MCHC: 31.6 g/dL (ref 30.0–36.0)
MCV: 78.5 fl (ref 78.0–100.0)
Monocytes Absolute: 0.4 10*3/uL (ref 0.1–1.0)
Monocytes Relative: 5.6 % (ref 3.0–12.0)
Neutro Abs: 4.5 10*3/uL (ref 1.4–7.7)
Neutrophils Relative %: 64 % (ref 43.0–77.0)
PLATELETS: 352 10*3/uL (ref 150.0–400.0)
RBC: 4.31 Mil/uL (ref 3.87–5.11)
RDW: 19 % — AB (ref 11.5–15.5)
WBC: 7 10*3/uL (ref 4.0–10.5)

## 2018-12-19 LAB — APTT: aPTT: 38.3 s — ABNORMAL HIGH (ref 23.4–32.7)

## 2018-12-19 LAB — PROTIME-INR
INR: 1.1 ratio — ABNORMAL HIGH (ref 0.8–1.0)
Prothrombin Time: 12.5 s (ref 9.6–13.1)

## 2018-12-19 NOTE — Telephone Encounter (Signed)
Attempted to call Patient.  No answer and unable to leave message, VM full.

## 2018-12-19 NOTE — Patient Instructions (Addendum)
Your procedure is scheduled for Wednesday February 13th at 1:00pm. This will be done at [facility]. You need to arrive by 11:30am. Go to admitting. Nothing to eat after midnight the night before. Aspirin 71m ONLY until after procedure. The morning of the procedure take your Blood Pressure Medication and Diabetic medication with a SIP of water. If you have questions or need to reschedule please give [place] call in advance.    Bronchoscopy  Flexible bronchoscopy is a procedure that is used to examine the passageways in the lungs. During the procedure, a thin, flexible tool with a camera on it (bronchoscope) is passed into the mouth or nose, down through the windpipe (trachea), and into the air tubes (bronchi) in the lungs. This tool allows your health care provider to look at your lungs from the inside and take testing (diagnostic) samples if needed. Tell a health care provider about:  Any allergies you have.  All medicines you are taking, including vitamins, herbs, eye drops, creams, and over-the-counter medicines.  Any problems you or family members have had with anesthetic medicines.  Any blood disorders you have.  Any surgeries you have had.  Any medical conditions you have.  Whether you are pregnant or may be pregnant. What are the risks? Generally, this is a safe procedure. However, problems may occur, including:  Infection.  Bleeding.  Damage to other structures or organs.  Allergic reactions to medicines.  Collapsed lung (pneumothorax).  Increased need for oxygen or difficulty breathing after the procedure. What happens before the procedure? Medicines You may be given antibiotic medicine to help prevent infection.  Eating and drinking Nothing to eat or drink after midnight the night before procedure  General instructions  Plan to have someone take you home from the hospital or clinic.  If you will be going home right after the procedure, plan to have someone with  you for 24 hours.  What happens during the procedure?  To lower your risk of infection: ? Your health care team will wash or sanitize their hands. ? Your skin will be washed with soap.  An IV tube will be inserted into one of your veins.  You will be given a medicine (local anesthetic) to numb your mouth, nose, throat, and voice box (larynx). You may also be given one or more of the following: ? A medicine to help you relax (sedative). ? A medicine to control coughing. ? A medicine to dry up any fluids in your lungs (secretions).  A bronchoscope will be passed into your nose or mouth, and into your lungs. Your health care provider will examine your lungs.  Samples of airway secretions may be collected for testing.  If abnormal areas are seen in your airways, tissue samples may be removed for examination under a microscope (biopsy).  If tissue samples are needed from the outer parts of the lung, a type of X-ray (fluoroscopy) may be used to guide the bronchoscope to these areas.  If bleeding occurs, you may be given medicine to stop or decrease the bleeding. The procedure may vary among health care providers and hospitals. What happens after the procedure?  Do not drive for 24 hours if you were given a sedative.  Your blood pressure, heart rate, breathing rate, and blood oxygen level will be monitored until the medicines you were given have worn off.  You may have a chest X-ray to check for signs of pneumothorax.  You will not be allowed to eat or drink anything  for 2 hours after your procedure.  If a biopsy was taken, it is up to you to get the results of your procedure. Ask your health care provider, or the department that is doing the procedure, when your results will be ready. Summary  Flexible bronchoscopy is a procedure that allows your health care provider to look closely at your lungs from the inside and take testing (diagnostic) samples if needed.  Risks of flexible  bronchoscopy include bleeding, infection, and pneumothorax.  Before a flexible bronchoscopy, you will be given a medicine (local anesthetic) to numb your mouth, nose, throat, and voice box (larynx). Then, a bronchoscope will be passed into your nose or mouth, and into your lungs.  After the procedure, your blood pressure, heart rate, breathing rate, and blood oxygen level will be monitored until the medicines you were given have worn off. You may have a chest X-ray to check for signs of pneumothorax.  You will not be allowed to eat or drink anything for 2 hours after your procedure. This information is not intended to replace advice given to you by your health care provider. Make sure you discuss any questions you have with your health care provider. Document Released: 10/23/2000 Document Revised: 11/28/2016 Document Reviewed: 11/28/2016 Elsevier Interactive Patient Education  2019 [employer].   Follow up:  Follow up with Dr. [name] in 2 weeks or sooner if needed

## 2018-12-19 NOTE — Telephone Encounter (Signed)
ATC pt, I did not receive an answer and I could not leave a message due to her voicemail being full.

## 2018-12-19 NOTE — Progress Notes (Addendum)
_0  ID: Tara Hopkins, female    DOB: 1950-11-18, 68 y.o.   MRN: [id number]  Chief Complaint  Patient presents with  . Follow-up    OV to discuss bronch.     Referring provider: [name], NP  HPI  68 year old female former smoker and is w/c  bound since around 2015  Due to PVD/neuropathy related to dm with episode where fell and couldn't get up > Ricardo eval 10/16/18  for ? SZ (though no loc or ams assoc with spell per pt but note UDS pos cocaine)  > abn cxr > CT chest 10/28/18 > pet ordered for 11/16/18 and referred to pulmonary clinic 11/10/2018. Patient is followed by Dr. Melvyn Novas.  Tests:  PET 12/01/18 - Mediastinal lymphadenopathy is markedly hypermetabolic. No evidence for hypermetabolic lymphadenopathy in the axillary regions or abdomen/pelvis. Hypermetabolic FDG accumulation in the tonsillar regions bilaterally and posterior left nasopharynx/oropharynx. Indeterminate, but lymphoma involvement not excluded. 10 mm right upper lobe nodule is hypermetabolic.  Chest CT 10/28/18 - Bulky mediastinal lymphadenopathy. Imaging features compatible with metastatic disease or lymphoma. 10 mm posterior right upper lobe nodule with adjacent satellite nodules. Neoplasm not excluded. PET-CT may prove helpful to further evaluate.  Spirometry 11/10/2018  FEV1 0.6 (33%)  Ratio 72 ? Accurate with poor f/v conture   OV 12/19/18 - Preop Bronch Patient is here today for a preop visit for upcoming ebus on 12/22/2018 with Dr. [name].  She was last seen on 11/10/2018 by Dr. Melvyn Novas for an initial consult of mass of right lung.  She was scheduled a PET scan at that visit.  After PET scan it was decided that she needed ebus.  Procedure has been scheduled for 12/22/2018 at 1:00pm.  Overall patient is doing well.  She will have preop labs drawn today.  She does have concerns about having the procedure done this week because she is also scheduled for an eye procedure on Friday, 12/23/2018 for detached retina.  This was  discussed with Dr. [name] and she has been told it is okay to have the ebus on Thursday and the eye procedure on Friday.  The procedure and preop instructions were discussed with her at the visit today.  She is afebrile today.  She denies any recent fever, chest pain, or edema.    No Known Allergies  Immunization History  Administered Date(s) Administered  . Influenza, High Dose Seasonal PF 07/29/2017, 08/17/2018  . Influenza,inj,Quad PF,6+ Mos 09/29/2012, 07/02/2013, 08/25/2016  . Influenza-Unspecified 08/10/2015  . Pneumococcal Conjugate-13 08/20/2014  . Pneumococcal Polysaccharide-23 05/22/2013, 08/10/2015    Past Medical History:  Diagnosis Date  . Anemia   . Anxiety   . Bipolar 1 disorder ([place])   . Cataracts, bilateral   . Depression   . Diabetes mellitus without complication ([place])   . Glaucoma   . History of blood transfusion   . History of hyperbaric oxygen therapy    pt reports 80 treatments.  . Hypercholesteremia   . Hypertension   . Peripheral neuropathy   . Stroke [facility]) 02/2015    Tobacco History: Social History   Tobacco Use  Smoking Status Former Smoker  . Packs/day: 0.50  . Years: 40.00  . Pack years: 20.00  . Types: Cigarettes  . Last attempt to quit: 10/17/2018  . Years since quitting: 0.1  Smokeless Tobacco Never Used   Counseling given: Not Answered   Outpatient Encounter Medications as of 12/19/2018  Medication Sig  . aspirin 325 MG EC tablet Take 1 tablet (  325 mg total) by mouth daily.  Marland Kitchen aspirin EC 81 MG tablet Take 81 mg by mouth daily.  Marland Kitchen atorvastatin (LIPITOR) 40 MG tablet Take 1 tablet (40 mg total) by mouth daily at 6 PM.  . citalopram (CELEXA) 10 MG tablet Take 1 tablet (10 mg total) by mouth daily.  . clonazePAM (KLONOPIN) 0.5 MG tablet Take 1 tablet (0.5 mg total) by mouth 3 (three) times daily.  Marland Kitchen gabapentin (NEURONTIN) 400 MG capsule TAKE 3 CAPSULES BY MOUTH THREE TIMES A DAY (Patient taking differently: Take 1,200 mg by mouth 3  (three) times daily. TAKE 3 CAPSULES BY MOUTH THREE TIMES A DAY)  . glimepiride (AMARYL) 4 MG tablet TAKE 1 TABLET BY MOUTH EVERY DAY( NEEDS YEARLY PHYSICAL WITH CORY) (Patient taking differently: Take 4 mg by mouth daily with breakfast. )  . glipiZIDE (GLUCOTROL) 5 MG tablet Take 1 tablet (5 mg total) by mouth 2 (two) times daily before a meal.  . glucose blood (ONETOUCH VERIO) test strip USE TO TEST BLOOD GLUCOSE 3 TIMES DAILY  . Insulin Glargine (LANTUS SOLOSTAR) 100 UNIT/ML Solostar Pen Inject 25 Units into the skin at bedtime. (Patient taking differently: Inject 40 Units into the skin at bedtime. )  . Insulin Pen Needle (B-D UF III MINI PEN NEEDLES) 31G X 5 MM MISC Use as directed.  . latanoprost (XALATAN) 0.005 % ophthalmic solution INSTILL 1 DROP INTO BOTH EYES EVERY EVENING (Patient taking differently: Place 1 drop into both eyes at bedtime. )  . lisinopril (PRINIVIL,ZESTRIL) 40 MG tablet Take 1 tablet (40 mg total) by mouth daily.  . ONE TOUCH LANCETS MISC USE TO CHECK BLOOD SUGAR 3 TIMES DAILY   No facility-administered encounter medications on file as of 12/19/2018.      Review of Systems  Review of Systems  Constitutional: Negative.  Negative for chills and fever.  HENT: Negative.   Respiratory: Negative for cough, shortness of breath and wheezing.   Cardiovascular: Negative.  Negative for chest pain, palpitations and leg swelling.  Gastrointestinal: Negative.   Allergic/Immunologic: Negative.   Neurological: Negative.   Psychiatric/Behavioral: Negative.        Physical Exam  BP (!) 164/90   Pulse 86   Ht _0  (1.651 m)   Wt 167 lb 3.2 oz (75.8 kg)   SpO2 97%   BMI 27.82 kg/m   Wt Readings from Last 5 Encounters:  12/19/18 167 lb 3.2 oz (75.8 kg)  11/10/18 167 lb (75.8 kg)  10/16/18 176 lb 5.9 oz (80 kg)  08/17/18 178 lb 6.4 oz (80.9 kg)  03/29/18 162 lb (73.5 kg)     Physical Exam Vitals signs and nursing note reviewed.  Constitutional:      General:  She is not in acute distress.    Appearance: She is well-developed.  Cardiovascular:     Rate and Rhythm: Normal rate and regular rhythm.  Pulmonary:     Effort: Pulmonary effort is normal. No respiratory distress.     Breath sounds: Normal breath sounds. No wheezing or rhonchi.  Musculoskeletal:        General: No swelling.  Neurological:     Mental Status: She is alert and oriented to person, place, and time.     Imaging: Nm Pet Image Initial (pi) Skull Base To Thigh  Result Date: 12/01/2018 CLINICAL DATA:  Initial treatment strategy for lymphoma. EXAM: NUCLEAR MEDICINE PET SKULL BASE TO THIGH TECHNIQUE: 9.4 mCi F-18 FDG was injected intravenously. Full-ring PET imaging was performed from  the skull base to thigh after the radiotracer. CT data was obtained and used for attenuation correction and anatomic localization. Fasting blood glucose: 60 a mg/dl COMPARISON:  Chest CT 10/28/2018 FINDINGS: Mediastinal blood pool activity: SUV max 2.9 NECK: Asymmetric FDG uptake is identified in the left nasopharynx and oropharynx with prominent uptake in the tonsillar regions bilaterally. Incidental CT findings: none CHEST: Lymphadenopathy in the mediastinum is markedly hypermetabolic. 2.1 cm short axis right paratracheal node (56/4) demonstrates SUV max = 25.2. Index node in the anterior mediastinum is 1.5 cm short axis today with SUV max = 17.2 bulky subcarinal lymphadenopathy measuring 2.8 cm short axis demonstrates SUV max = 23.3. No evidence for hypermetabolic lymphadenopathy in the axillary regions. Posterior right upper lobe pulmonary nodule measuring 10 mm on the previous diagnostic CT is hypermetabolic with SUV max = 3.1 (image 65/4). Incidental CT findings: Similar appearance of the small pericardial effusion. Coronary artery calcification is evident. Atherosclerotic calcification is noted in the wall of the thoracic aorta. ABDOMEN/PELVIS: No abnormal hypermetabolic activity within the liver,  pancreas, adrenal glands, or spleen. No hypermetabolic lymph nodes in the abdomen or pelvis. Incidental CT findings: none SKELETON: No focal hypermetabolic activity to suggest skeletal metastasis. Uptake in the hips likely degenerative. Incidental CT findings: none IMPRESSION: 1. Mediastinal lymphadenopathy is markedly hypermetabolic. No evidence for hypermetabolic lymphadenopathy in the axillary regions or abdomen/pelvis. 2. Hypermetabolic FDG accumulation in the tonsillar regions bilaterally and posterior left nasopharynx/oropharynx. Indeterminate, but lymphoma involvement not excluded. 3. 10 mm right upper lobe nodule is hypermetabolic. Electronically Signed   By: Misty Stanley M.D.   On: 12/01/2018 13:50     Assessment & Plan:   From pulmonary stand point patient is at low risk for procedure Will order pre-op labs today Major Pulmonary risks identified in the multifactorial risk analysis are but not limited to a) pneumonia; b) recurrent intubation risk; c) prolonged or recurrent acute respiratory failure needing mechanical ventilation; d) prolonged hospitalization; e) DVT/Pulmonary embolism; f) Acute Pulmonary edema  Recommend 1. Short duration of surgery as much as possible and avoid paralytic if possible 2. DVT prophylaxis 3. Aggressive pulmonary toilet with O2, bronchodilatation, and incentive spirometry and early ambulation   CANET Postperative Pulmonary Risk Score -for any pulm complication Comment Score  Age - <50 (0), 50-80 (3), >80 (16)  3  Preoperative pulse ox - >96 (0), 91-95 (8), <90 (24)  0  Respiratory infection in last month - Yes (17)  0  Preoperative anemia - < 10gm% - Yes (11)  0  Surgical incision - Upper abdominal (15), Thoracic (24)  0  Duration of surgery - <2h (0), 2-3h (16), >3h (23)  0  Emergency Surgery - Yes (8)  0  TOTAL    Risk Stratification - Low (<26), Intermediate (26-44), High (>45)  3     Mass of right lung Patient Instructions  Your procedure is  scheduled for Wednesday February 13th at 1:00pm. [facility] be [facility] at [facility]. You need to arrive by 11:30am. Go to admitting. Nothing to eat after midnight the night before. Aspirin 57m ONLY until after procedure. The morning of the procedure take your Blood Pressure Medication and Diabetic medication with a SIP of water. If you have questions or need to reschedule please give [facility] call in advance.    Bronchoscopy  Flexible bronchoscopy is a procedure that is used to examine the passageways in the lungs. During the procedure, a thin, flexible tool with a camera on it (bronchoscope)  is passed into the mouth or nose, down through the windpipe (trachea), and into the air tubes (bronchi) in the lungs. This tool allows your health care provider to look at your lungs from the inside and take testing (diagnostic) samples if needed. Tell a health care provider about:  Any allergies you have.  All medicines you are taking, including vitamins, herbs, eye drops, creams, and over-the-counter medicines.  Any problems you or family members have had with anesthetic medicines.  Any blood disorders you have.  Any surgeries you have had.  Any medical conditions you have.  Whether you are pregnant or may be pregnant. What are the risks? Generally, this is a safe procedure. However, problems may occur, including:  Infection.  Bleeding.  Damage to other structures or organs.  Allergic reactions to medicines.  Collapsed lung (pneumothorax).  Increased need for oxygen or difficulty breathing after the procedure. What happens before the procedure? Medicines You may be given antibiotic medicine to help prevent infection.  Eating and drinking Nothing to eat or drink after midnight the night before procedure  General instructions  Plan to have someone take you home from the hospital or clinic.  If you will be going home right after the procedure, plan to have someone with you for 24  hours.  What happens during the procedure?  To lower your risk of infection: ? Your health care team will wash or sanitize their hands. ? Your skin will be washed with soap.  An IV tube will be inserted into one of your veins.  You will be given a medicine (local anesthetic) to numb your mouth, nose, throat, and voice box (larynx). You may also be given one or more of the following: ? A medicine to help you relax (sedative). ? A medicine to control coughing. ? A medicine to dry up any fluids in your lungs (secretions).  A bronchoscope will be passed into your nose or mouth, and into your lungs. Your health care provider will examine your lungs.  Samples of airway secretions may be collected for testing.  If abnormal areas are seen in your airways, tissue samples may be removed for examination under a microscope (biopsy).  If tissue samples are needed from the outer parts of the lung, a type of X-ray (fluoroscopy) may be used to guide the bronchoscope to these areas.  If bleeding occurs, you may be given medicine to stop or decrease the bleeding. The procedure may vary among health care providers and hospitals. What happens after the procedure?  Do not drive for 24 hours if you were given a sedative.  Your blood pressure, heart rate, breathing rate, and blood oxygen level will be monitored until the medicines you were given have worn off.  You may have a chest X-ray to check for signs of pneumothorax.  You will not be allowed to eat or drink anything for 2 hours after your procedure.  If a biopsy was taken, it is up to you to get the results of your procedure. Ask your health care provider, or the department that is doing the procedure, when your results will be ready. Summary  Flexible bronchoscopy is a procedure that allows your health care provider to look closely at your lungs from the inside and take testing (diagnostic) samples if needed.  Risks of flexible bronchoscopy  include bleeding, infection, and pneumothorax.  Before a flexible bronchoscopy, you will be given a medicine (local anesthetic) to numb your mouth, nose, throat, and voice box (larynx).  Then, a bronchoscope will be passed into your nose or mouth, and into your lungs.  After the procedure, your blood pressure, heart rate, breathing rate, and blood oxygen level will be monitored until the medicines you were given have worn off. You may have a chest X-ray to check for signs of pneumothorax.  You will not be allowed to eat or drink anything for 2 hours after your procedure. This information is not intended to replace advice given to you by your health care provider. Make sure you discuss any questions you have with your health care provider. Document Released: 10/23/2000 Document Revised: 11/28/2016 Document Reviewed: 11/28/2016 Elsevier Interactive Patient Education  2019 [employer].   Follow up:  Follow up with Dr. [name] in 2 weeks or sooner if needed         Fenton Foy, NP 12/19/2018

## 2018-12-19 NOTE — Telephone Encounter (Signed)
Pt is calling back [phone]

## 2018-12-19 NOTE — H&P (View-Only) (Signed)
_0  ID: Tara Hopkins, female    DOB: October 24, 1951, 68 y.o.   MRN: [id number]  Chief Complaint  Patient presents with  . Follow-up    OV to discuss bronch.     Referring provider: [name], NP  HPI  68 year old female former smoker and is w/c  bound since around 2015  Due to PVD/neuropathy related to dm with episode where fell and couldn't get up > [place] eval 10/16/18  for ? SZ (though no loc or ams assoc with spell per pt but note UDS pos cocaine)  > abn cxr > CT chest 10/28/18 > pet ordered for 11/16/18 and referred to pulmonary clinic 11/10/2018. Patient is followed by Dr. Melvyn Novas.  Tests:  PET 12/01/18 - Mediastinal lymphadenopathy is markedly hypermetabolic. No evidence for hypermetabolic lymphadenopathy in the axillary regions or abdomen/pelvis. Hypermetabolic FDG accumulation in the tonsillar regions bilaterally and posterior left nasopharynx/oropharynx. Indeterminate, but lymphoma involvement not excluded. 10 mm right upper lobe nodule is hypermetabolic.  Chest CT 10/28/18 - Bulky mediastinal lymphadenopathy. Imaging features compatible with metastatic disease or lymphoma. 10 mm posterior right upper lobe nodule with adjacent satellite nodules. Neoplasm not excluded. PET-CT may prove helpful to further evaluate.  Spirometry 11/10/2018  FEV1 0.6 (33%)  Ratio 72 ? Accurate with poor f/v conture   OV 12/19/18 - Preop Bronch Patient is here today for a preop visit for upcoming ebus on 12/22/2018 with Dr. [name].  She was last seen on 11/10/2018 by Dr. Melvyn Novas for an initial consult of mass of right lung.  She was scheduled a PET scan at that visit.  After PET scan it was decided that she needed ebus.  Procedure has been scheduled for 12/22/2018 at 1:00pm.  Overall patient is doing well.  She will have preop labs drawn today.  She does have concerns about having the procedure done this week because she is also scheduled for an eye procedure on Friday, 12/23/2018 for detached retina.  This was  discussed with Dr. [name] and she has been told it is okay to have the ebus on Thursday and the eye procedure on Friday.  The procedure and preop instructions were discussed with her at the visit today.  She is afebrile today.  She denies any recent fever, chest pain, or edema.    No Known Allergies  Immunization History  Administered Date(s) Administered  . Influenza, High Dose Seasonal PF 07/29/2017, 08/17/2018  . Influenza,inj,Quad PF,6+ Mos 09/29/2012, 07/02/2013, 08/25/2016  . Influenza-Unspecified 08/10/2015  . Pneumococcal Conjugate-13 08/20/2014  . Pneumococcal Polysaccharide-23 05/22/2013, 08/10/2015    Past Medical History:  Diagnosis Date  . Anemia   . Anxiety   . Bipolar 1 disorder ([place])   . Cataracts, bilateral   . Depression   . Diabetes mellitus without complication ([place])   . Glaucoma   . History of blood transfusion   . History of hyperbaric oxygen therapy    pt reports 80 treatments.  . Hypercholesteremia   . Hypertension   . Peripheral neuropathy   . Stroke [facility]) 02/2015    Tobacco History: Social History   Tobacco Use  Smoking Status Former Smoker  . Packs/day: 0.50  . Years: 40.00  . Pack years: 20.00  . Types: Cigarettes  . Last attempt to quit: 10/17/2018  . Years since quitting: 0.1  Smokeless Tobacco Never Used   Counseling given: Not Answered   Outpatient Encounter Medications as of 12/19/2018  Medication Sig  . aspirin 325 MG EC tablet Take 1 tablet (  325 mg total) by mouth daily.  Marland Kitchen aspirin EC 81 MG tablet Take 81 mg by mouth daily.  Marland Kitchen atorvastatin (LIPITOR) 40 MG tablet Take 1 tablet (40 mg total) by mouth daily at 6 PM.  . citalopram (CELEXA) 10 MG tablet Take 1 tablet (10 mg total) by mouth daily.  . clonazePAM (KLONOPIN) 0.5 MG tablet Take 1 tablet (0.5 mg total) by mouth 3 (three) times daily.  Marland Kitchen gabapentin (NEURONTIN) 400 MG capsule TAKE 3 CAPSULES BY MOUTH THREE TIMES A DAY (Patient taking differently: Take 1,200 mg by mouth 3  (three) times daily. TAKE 3 CAPSULES BY MOUTH THREE TIMES A DAY)  . glimepiride (AMARYL) 4 MG tablet TAKE 1 TABLET BY MOUTH EVERY DAY( NEEDS YEARLY PHYSICAL WITH CORY) (Patient taking differently: Take 4 mg by mouth daily with breakfast. )  . glipiZIDE (GLUCOTROL) 5 MG tablet Take 1 tablet (5 mg total) by mouth 2 (two) times daily before a meal.  . glucose blood (ONETOUCH VERIO) test strip USE TO TEST BLOOD GLUCOSE 3 TIMES DAILY  . Insulin Glargine (LANTUS SOLOSTAR) 100 UNIT/ML Solostar Pen Inject 25 Units into the skin at bedtime. (Patient taking differently: Inject 40 Units into the skin at bedtime. )  . Insulin Pen Needle (B-D UF III MINI PEN NEEDLES) 31G X 5 MM MISC Use as directed.  . latanoprost (XALATAN) 0.005 % ophthalmic solution INSTILL 1 DROP INTO BOTH EYES EVERY EVENING (Patient taking differently: Place 1 drop into both eyes at bedtime. )  . lisinopril (PRINIVIL,ZESTRIL) 40 MG tablet Take 1 tablet (40 mg total) by mouth daily.  . ONE TOUCH LANCETS MISC USE TO CHECK BLOOD SUGAR 3 TIMES DAILY   No facility-administered encounter medications on file as of 12/19/2018.      Review of Systems  Review of Systems  Constitutional: Negative.  Negative for chills and fever.  HENT: Negative.   Respiratory: Negative for cough, shortness of breath and wheezing.   Cardiovascular: Negative.  Negative for chest pain, palpitations and leg swelling.  Gastrointestinal: Negative.   Allergic/Immunologic: Negative.   Neurological: Negative.   Psychiatric/Behavioral: Negative.        Physical Exam  BP (!) 164/90   Pulse 86   Ht _0  (1.651 m)   Wt 167 lb 3.2 oz (75.8 kg)   SpO2 97%   BMI 27.82 kg/m   Wt Readings from Last 5 Encounters:  12/19/18 167 lb 3.2 oz (75.8 kg)  11/10/18 167 lb (75.8 kg)  10/16/18 176 lb 5.9 oz (80 kg)  08/17/18 178 lb 6.4 oz (80.9 kg)  03/29/18 162 lb (73.5 kg)     Physical Exam Vitals signs and nursing note reviewed.  Constitutional:      General:  She is not in acute distress.    Appearance: She is well-developed.  Cardiovascular:     Rate and Rhythm: Normal rate and regular rhythm.  Pulmonary:     Effort: Pulmonary effort is normal. No respiratory distress.     Breath sounds: Normal breath sounds. No wheezing or rhonchi.  Musculoskeletal:        General: No swelling.  Neurological:     Mental Status: She is alert and oriented to person, place, and time.     Imaging: Nm Pet Image Initial (pi) Skull Base To Thigh  Result Date: 12/01/2018 CLINICAL DATA:  Initial treatment strategy for lymphoma. EXAM: NUCLEAR MEDICINE PET SKULL BASE TO THIGH TECHNIQUE: 9.4 mCi F-18 FDG was injected intravenously. Full-ring PET imaging was performed from  the skull base to thigh after the radiotracer. CT data was obtained and used for attenuation correction and anatomic localization. Fasting blood glucose: 60 a mg/dl COMPARISON:  Chest CT 10/28/2018 FINDINGS: Mediastinal blood pool activity: SUV max 2.9 NECK: Asymmetric FDG uptake is identified in the left nasopharynx and oropharynx with prominent uptake in the tonsillar regions bilaterally. Incidental CT findings: none CHEST: Lymphadenopathy in the mediastinum is markedly hypermetabolic. 2.1 cm short axis right paratracheal node (56/4) demonstrates SUV max = 25.2. Index node in the anterior mediastinum is 1.5 cm short axis today with SUV max = 17.2 bulky subcarinal lymphadenopathy measuring 2.8 cm short axis demonstrates SUV max = 23.3. No evidence for hypermetabolic lymphadenopathy in the axillary regions. Posterior right upper lobe pulmonary nodule measuring 10 mm on the previous diagnostic CT is hypermetabolic with SUV max = 3.1 (image 65/4). Incidental CT findings: Similar appearance of the small pericardial effusion. Coronary artery calcification is evident. Atherosclerotic calcification is noted in the wall of the thoracic aorta. ABDOMEN/PELVIS: No abnormal hypermetabolic activity within the liver,  pancreas, adrenal glands, or spleen. No hypermetabolic lymph nodes in the abdomen or pelvis. Incidental CT findings: none SKELETON: No focal hypermetabolic activity to suggest skeletal metastasis. Uptake in the hips likely degenerative. Incidental CT findings: none IMPRESSION: 1. Mediastinal lymphadenopathy is markedly hypermetabolic. No evidence for hypermetabolic lymphadenopathy in the axillary regions or abdomen/pelvis. 2. Hypermetabolic FDG accumulation in the tonsillar regions bilaterally and posterior left nasopharynx/oropharynx. Indeterminate, but lymphoma involvement not excluded. 3. 10 mm right upper lobe nodule is hypermetabolic. Electronically Signed   By: Eric  Mansell M.D.   On: 12/01/2018 13:50     Assessment & Plan:   From pulmonary stand point patient is at low risk for procedure Will order pre-op labs today Major Pulmonary risks identified in the multifactorial risk analysis are but not limited to a) pneumonia; b) recurrent intubation risk; c) prolonged or recurrent acute respiratory failure needing mechanical ventilation; d) prolonged hospitalization; e) DVT/Pulmonary embolism; f) Acute Pulmonary edema  Recommend 1. Short duration of surgery as much as possible and avoid paralytic if possible 2. DVT prophylaxis 3. Aggressive pulmonary toilet with O2, bronchodilatation, and incentive spirometry and early ambulation   CANET Postperative Pulmonary Risk Score -for any pulm complication Comment Score  Age - <50 (0), 50-80 (3), >80 (16)  3  Preoperative pulse ox - >96 (0), 91-95 (8), <90 (24)  0  Respiratory infection in last month - Yes (17)  0  Preoperative anemia - < 10gm% - Yes (11)  0  Surgical incision - Upper abdominal (15), Thoracic (24)  0  Duration of surgery - <2h (0), 2-3h (16), >3h (23)  0  Emergency Surgery - Yes (8)  0  TOTAL    Risk Stratification - Low (<26), Intermediate (26-44), High (>45)  3     Mass of right lung Patient Instructions  Your procedure is  scheduled for Wednesday February 13th at 1:00pm. This will be done at [facility]. You need to arrive by 11:30am. Go to admitting. Nothing to eat after midnight the night before. Aspirin 81mg ONLY until after procedure. The morning of the procedure take your Blood Pressure Medication and Diabetic medication with a SIP of water. If you have questions or need to reschedule please give us a call in advance.    Bronchoscopy  Flexible bronchoscopy is a procedure that is used to examine the passageways in the lungs. During the procedure, a thin, flexible tool with a camera on it (bronchoscope)   is passed into the mouth or nose, down through the windpipe (trachea), and into the air tubes (bronchi) in the lungs. This tool allows your health care provider to look at your lungs from the inside and take testing (diagnostic) samples if needed. Tell a health care provider about:  Any allergies you have.  All medicines you are taking, including vitamins, herbs, eye drops, creams, and over-the-counter medicines.  Any problems you or family members have had with anesthetic medicines.  Any blood disorders you have.  Any surgeries you have had.  Any medical conditions you have.  Whether you are pregnant or may be pregnant. What are the risks? Generally, this is a safe procedure. However, problems may occur, including:  Infection.  Bleeding.  Damage to other structures or organs.  Allergic reactions to medicines.  Collapsed lung (pneumothorax).  Increased need for oxygen or difficulty breathing after the procedure. What happens before the procedure? Medicines You may be given antibiotic medicine to help prevent infection.  Eating and drinking Nothing to eat or drink after midnight the night before procedure  General instructions  Plan to have someone take you home from the hospital or clinic.  If you will be going home right after the procedure, plan to have someone with you for 24  hours.  What happens during the procedure?  To lower your risk of infection: ? Your health care team will wash or sanitize their hands. ? Your skin will be washed with soap.  An IV tube will be inserted into one of your veins.  You will be given a medicine (local anesthetic) to numb your mouth, nose, throat, and voice box (larynx). You may also be given one or more of the following: ? A medicine to help you relax (sedative). ? A medicine to control coughing. ? A medicine to dry up any fluids in your lungs (secretions).  A bronchoscope will be passed into your nose or mouth, and into your lungs. Your health care provider will examine your lungs.  Samples of airway secretions may be collected for testing.  If abnormal areas are seen in your airways, tissue samples may be removed for examination under a microscope (biopsy).  If tissue samples are needed from the outer parts of the lung, a type of X-ray (fluoroscopy) may be used to guide the bronchoscope to these areas.  If bleeding occurs, you may be given medicine to stop or decrease the bleeding. The procedure may vary among health care providers and hospitals. What happens after the procedure?  Do not drive for 24 hours if you were given a sedative.  Your blood pressure, heart rate, breathing rate, and blood oxygen level will be monitored until the medicines you were given have worn off.  You may have a chest X-ray to check for signs of pneumothorax.  You will not be allowed to eat or drink anything for 2 hours after your procedure.  If a biopsy was taken, it is up to you to get the results of your procedure. Ask your health care provider, or the department that is doing the procedure, when your results will be ready. Summary  Flexible bronchoscopy is a procedure that allows your health care provider to look closely at your lungs from the inside and take testing (diagnostic) samples if needed.  Risks of flexible bronchoscopy  include bleeding, infection, and pneumothorax.  Before a flexible bronchoscopy, you will be given a medicine (local anesthetic) to numb your mouth, nose, throat, and voice box (larynx).  Then, a bronchoscope will be passed into your nose or mouth, and into your lungs.  After the procedure, your blood pressure, heart rate, breathing rate, and blood oxygen level will be monitored until the medicines you were given have worn off. You may have a chest X-ray to check for signs of pneumothorax.  You will not be allowed to eat or drink anything for 2 hours after your procedure. This information is not intended to replace advice given to you by your health care provider. Make sure you discuss any questions you have with your health care provider. Document Released: 10/23/2000 Document Revised: 11/28/2016 Document Reviewed: 11/28/2016 Elsevier Interactive Patient Education  2019 [employer].   Follow up:  Follow up with Dr. [name] in 2 weeks or sooner if needed         Fenton Foy, NP 12/19/2018

## 2018-12-19 NOTE — Assessment & Plan Note (Signed)
Patient Instructions  Your procedure is scheduled for Wednesday February 13th at 1:00pm. This will be done at [facility]. You need to arrive by 11:30am. Go to admitting. Nothing to eat after midnight the night before. Aspirin 24m ONLY until after procedure. The morning of the procedure take your Blood Pressure Medication and Diabetic medication with a SIP of water. If you have questions or need to reschedule please give [place] call in advance.    Bronchoscopy  Flexible bronchoscopy is a procedure that is used to examine the passageways in the lungs. During the procedure, a thin, flexible tool with a camera on it (bronchoscope) is passed into the mouth or nose, down through the windpipe (trachea), and into the air tubes (bronchi) in the lungs. This tool allows your health care provider to look at your lungs from the inside and take testing (diagnostic) samples if needed. Tell a health care provider about:  Any allergies you have.  All medicines you are taking, including vitamins, herbs, eye drops, creams, and over-the-counter medicines.  Any problems you or family members have had with anesthetic medicines.  Any blood disorders you have.  Any surgeries you have had.  Any medical conditions you have.  Whether you are pregnant or may be pregnant. What are the risks? Generally, this is a safe procedure. However, problems may occur, including:  Infection.  Bleeding.  Damage to other structures or organs.  Allergic reactions to medicines.  Collapsed lung (pneumothorax).  Increased need for oxygen or difficulty breathing after the procedure. What happens before the procedure? Medicines You may be given antibiotic medicine to help prevent infection.  Eating and drinking Nothing to eat or drink after midnight the night before procedure  General instructions  Plan to have someone take you home from the hospital or clinic.  If you will be going home right after the procedure, plan  to have someone with you for 24 hours.  What happens during the procedure?  To lower your risk of infection: ? Your health care team will wash or sanitize their hands. ? Your skin will be washed with soap.  An IV tube will be inserted into one of your veins.  You will be given a medicine (local anesthetic) to numb your mouth, nose, throat, and voice box (larynx). You may also be given one or more of the following: ? A medicine to help you relax (sedative). ? A medicine to control coughing. ? A medicine to dry up any fluids in your lungs (secretions).  A bronchoscope will be passed into your nose or mouth, and into your lungs. Your health care provider will examine your lungs.  Samples of airway secretions may be collected for testing.  If abnormal areas are seen in your airways, tissue samples may be removed for examination under a microscope (biopsy).  If tissue samples are needed from the outer parts of the lung, a type of X-ray (fluoroscopy) may be used to guide the bronchoscope to these areas.  If bleeding occurs, you may be given medicine to stop or decrease the bleeding. The procedure may vary among health care providers and hospitals. What happens after the procedure?  Do not drive for 24 hours if you were given a sedative.  Your blood pressure, heart rate, breathing rate, and blood oxygen level will be monitored until the medicines you were given have worn off.  You may have a chest X-ray to check for signs of pneumothorax.  You will not be allowed to eat  or drink anything for 2 hours after your procedure.  If a biopsy was taken, it is up to you to get the results of your procedure. Ask your health care provider, or the department that is doing the procedure, when your results will be ready. Summary  Flexible bronchoscopy is a procedure that allows your health care provider to look closely at your lungs from the inside and take testing (diagnostic) samples if  needed.  Risks of flexible bronchoscopy include bleeding, infection, and pneumothorax.  Before a flexible bronchoscopy, you will be given a medicine (local anesthetic) to numb your mouth, nose, throat, and voice box (larynx). Then, a bronchoscope will be passed into your nose or mouth, and into your lungs.  After the procedure, your blood pressure, heart rate, breathing rate, and blood oxygen level will be monitored until the medicines you were given have worn off. You may have a chest X-ray to check for signs of pneumothorax.  You will not be allowed to eat or drink anything for 2 hours after your procedure. This information is not intended to replace advice given to you by your health care provider. Make sure you discuss any questions you have with your health care provider. Document Released: 10/23/2000 Document Revised: 11/28/2016 Document Reviewed: 11/28/2016 Elsevier Interactive Patient Education  2019 [employer].   Follow up:  Follow up with Dr. [name] in 2 weeks or sooner if needed

## 2018-12-20 ENCOUNTER — Encounter (HOSPITAL_COMMUNITY): Payer: Self-pay | Admitting: *Deleted

## 2018-12-20 ENCOUNTER — Ambulatory Visit: Payer: Self-pay | Admitting: Pulmonary Disease

## 2018-12-20 ENCOUNTER — Other Ambulatory Visit: Payer: Self-pay

## 2018-12-20 NOTE — Progress Notes (Signed)
PCCM: Agree. Patient would benefit from planned EBUS bronchoscopy.  [facility] 12/20/2018 5:56 PM

## 2018-12-20 NOTE — Progress Notes (Signed)
Pt denies SOB, chest pain, and being under the care of a cardiologist. Pt denies having a stress test and cardiac cath. Pt made aware to stop taking vitamins, fish oil and herbal medications. Do not take any NSAIDs ie: Ibuprofen, Advil, Naproxen (Aleve), Motrin, BC and Goody Powder. Pt made aware to hold Glipizide Wednesday night and DOS, no Glimepiride DOS and only 20 units of Lantus Insulin on Wednesday night. Pt made aware to check BG every 2 hours prior to arrival to hospital on DOS. Pt made aware to treat a BG < 70 with 4 ounces of  cranberry juice, wait 15 minutes after intervention to recheck BG, if BG remains < 70, call Short Stay unit to speak with a nurse. Pt verbalized understanding of all pre-op instructions. PA, Anesthesiology, asked to review pt history.

## 2018-12-20 NOTE — Telephone Encounter (Signed)
Called patient, unable to reach and unable to leave voicemail as mailbox was full. WCB

## 2018-12-21 NOTE — Telephone Encounter (Signed)
Called and spoke with patient, she had questions in regards to what will happen during the procedure. Explained the procedure to the patient and answered all questions patient had. Patient verbalized understanding and stated before hanging up that she had no further questions or concerns. Nothing further needed.

## 2018-12-21 NOTE — Anesthesia Preprocedure Evaluation (Addendum)
Anesthesia Evaluation  Patient identified by MRN, date of birth, ID band Patient awake    Reviewed: Allergy & Precautions, H&P , NPO status , Patient's Chart, lab work & pertinent test results  Airway Mallampati: II  TM Distance: >3 FB Neck ROM: Full    Dental no notable dental hx. (+) Edentulous Upper, Dental Advisory Given   Pulmonary neg pulmonary ROS, former smoker,    Pulmonary exam normal breath sounds clear to auscultation       Cardiovascular hypertension, Pt. on medications + Peripheral Vascular Disease   Rhythm:Regular Rate:Normal     Neuro/Psych Anxiety Depression Bipolar Disorder CVA, Residual Symptoms    GI/Hepatic negative GI ROS, Neg liver ROS,   Endo/Other  diabetes, Type 1, Insulin Dependent  Renal/GU Renal InsufficiencyRenal disease  negative genitourinary   Musculoskeletal   Abdominal   Peds  Hematology  (+) Blood dyscrasia, anemia ,   Anesthesia Other Findings   Reproductive/Obstetrics negative OB ROS                          Anesthesia Physical Anesthesia Plan  ASA: III  Anesthesia Plan: General   Post-op Pain Management:    Induction: Intravenous  PONV Risk Score and Plan: 4 or greater and Ondansetron and Midazolam  Airway Management Planned: Oral ETT  Additional Equipment:   Intra-op Plan:   Post-operative Plan: Extubation in OR  Informed Consent: I have reviewed the patients History and Physical, chart, labs and discussed the procedure including the risks, benefits and alternatives for the proposed anesthesia with the patient or authorized representative who has indicated his/her understanding and acceptance.     Dental advisory given  Plan Discussed with: CRNA  Anesthesia Plan Comments: (See PAT note by Karoline Caldwell, PA-C )       Anesthesia Quick Evaluation

## 2018-12-21 NOTE — Progress Notes (Addendum)
Anesthesia Chart Review: [name]   Case:  [id number] Date/Time:  12/22/18 1300   Procedure:  VIDEO BRONCHOSCOPY WITH ENDOBRONCHIAL ULTRASOUND (N/A )   Anesthesia type:  General   Pre-op diagnosis:  Mediastinal Adenopathy   Location:  [facility] OR ROOM 10 / [name]   Surgeon:  [name], DO      DISCUSSION: 68 yo female former smoker. Pertinent hx includes IDDMII, HTN, Bipolar, peripheral neuropathy, CVA left sided deficits, polysubstance abuse, s/p right foot amputation, w/c bound since around 2015 due to PVD/neuropathy related to dm   Pt seen in ED 10/16/2018 for eval of new onset seizure. Per ED note "Patient presents by ambulance after she pushed her life alert button at home. They found her on the floor seemingly confused.  During transport she experienced about a 15-second tonic-clonic seizure... Reviewed the case with neurology Dr. Malen Gauze who recommends that we IV load the patient with 1 g of IV Keppra and send her home with a prescription of 500 mg twice daily Keppra and have her follow-up with [facility] neurology who she has seen in the past." CT of the head showed no acute intracranial pathology. Of note, tox screen during ED workup was positive for cocaine. No further seizure episodes.   During ED workup pt had abnormal CXR ultimately followed up with CT and PET which revealed bulky lymphadenopathy in the mediastinum that is markedly hypermetabolic. She was referred to pulmonology and scheduled for EBUS. Per Dr. Gustavus Bryant notes the pt states the reason she called EMS on 12/8 was due to a fall out of bed, she denies seizure, although ED documentation says seizure activity witnessed by EMS.   Pulmonary clearance by Lazaro Arms, NP 12/19/18 "From pulmonary stand point patient is at low risk for procedure."  I called the pt due to new onset seizure. She stated she has not had any additional seizure activity (although she also denies that she had a seizure during EMS transport). Unfortunately  she is not taking the Keppra as prescribed and has not had followup with neurology. I advised her that it is important she follows up with neurology to complete workup and see if she needs ongoing treatment. She verbalized understanding but also stated she had difficulty both financially and with transportation and it's hard for her to get to appointments or afford new medications.   I discussed the case with Dr. [name] and Dr. [name]. Both advised that it was okay to tentatively proceed with surgery as planned. Pt will need eval tomorrow. I sent a message to Dr. [name] at [facility] as she previously followed with him s/p CVA to make him aware. I have also made Dr. [name] aware.  VS: There were no vitals taken for this visit.  PROVIDERS: [name], NP is PCP  [name], MD is Pulmonologist  LABS: Will need DOS BMP. CBC from 12/19/2018 reviewed notable for anemia with hgb 10.7.  Labs Reviewed - No data to display   IMAGES: NM PET Imaging 12/01/2018: IMPRESSION: 1. Mediastinal lymphadenopathy is markedly hypermetabolic. No evidence for hypermetabolic lymphadenopathy in the axillary regions or abdomen/pelvis. 2. Hypermetabolic FDG accumulation in the tonsillar regions bilaterally and posterior left nasopharynx/oropharynx. Indeterminate, but lymphoma involvement not excluded. 3. 10 mm right upper lobe nodule is hypermetabolic.  EKG: 10/16/2018: Sinus tachycardia. Rate 96. Atrial premature complex. Short PR interval (difficult to measure with baseline artifact). Probable LVH with secondary repol abnrm. poor baseline affects interpretation  CV: TTE 07/21/2017: Study Conclusions  -  Left ventricle: The cavity size was normal. There was moderate   focal basal hypertrophy of the septum. Systolic function was   normal. The estimated ejection fraction was in the range of 55%   to 60%. Wall motion was normal; there were no regional wall   motion abnormalities. Features are consistent with a  pseudonormal   left ventricular filling pattern, with concomitant abnormal   relaxation and increased filling pressure (grade 2 diastolic   dysfunction). - Mitral valve: There was mild regurgitation. - Tricuspid valve: There was mild regurgitation. - Pulmonary arteries: Systolic pressure was mildly increased. PA   peak pressure: 34 mm Hg (S).  Impressions:  - No cardiac source of emboli was indentified. Compared to the   prior study, there has been no significant interval change.  Past Medical History:  Diagnosis Date  . Anemia   . Anxiety   . Bipolar 1 disorder ([place])   . Cataracts, bilateral   . Depression   . Diabetes mellitus without complication ([place])   . Glaucoma   . History of blood transfusion   . History of hyperbaric oxygen therapy    pt reports 80 treatments.  . Hypercholesteremia   . Hypertension   . Mediastinal adenopathy   . Peripheral neuropathy   . Pneumonia   . Stroke ([place]) 02/2015  . Wears glasses     Past Surgical History:  Procedure Laterality Date  . AMPUTATION Right 12/15/2013   Procedure: Right Transmetatarsal Amputation;  Surgeon: Newt Minion, MD;  Location: [place];  Service: Orthopedics;  Laterality: Right;  . AMPUTATION Right 02/23/2014   Procedure: AMPUTATION FOOT;  Surgeon: Newt Minion, MD;  Location: [place];  Service: Orthopedics;  Laterality: Right;  Right Foot Revision Transmetatarsal Amputation  . CATARACT EXTRACTION W/ INTRAOCULAR LENS  IMPLANT, BILATERAL    . COLONOSCOPY W/ BIOPSIES AND POLYPECTOMY    . I&D EXTREMITY Right 05/23/2013   Procedure: IRRIGATION AND DEBRIDEMENT RIGHT GREAT TOE;  Surgeon: Mauri Pole, MD;  Location: [facility];  Service: Orthopedics;  Laterality: Right;  . TUBAL LIGATION      MEDICATIONS: No current facility-administered medications for this encounter.    Marland Kitchen aspirin EC 81 MG tablet  . atorvastatin (LIPITOR) 40 MG tablet  . clonazePAM (KLONOPIN) 0.5 MG tablet  . gabapentin (NEURONTIN) 400 MG capsule  .  glimepiride (AMARYL) 4 MG tablet  . Insulin Glargine (LANTUS SOLOSTAR) 100 UNIT/ML Solostar Pen  . latanoprost (XALATAN) 0.005 % ophthalmic solution  . lisinopril (PRINIVIL,ZESTRIL) 40 MG tablet  . aspirin 325 MG EC tablet  . citalopram (CELEXA) 10 MG tablet  . glipiZIDE (GLUCOTROL) 5 MG tablet  . glucose blood (ONETOUCH VERIO) test strip  . Insulin Pen Needle (B-D UF III MINI PEN NEEDLES) 31G X 5 MM MISC  . ONE TOUCH LANCETS MISC    Wynonia Musty [facility] Short Stay Center/Anesthesiology Phone [phone] 12/21/2018 11:13 AM

## 2018-12-22 ENCOUNTER — Ambulatory Visit (HOSPITAL_COMMUNITY): Payer: Medicare Other | Admitting: Physician Assistant

## 2018-12-22 ENCOUNTER — Other Ambulatory Visit: Payer: Self-pay

## 2018-12-22 ENCOUNTER — Ambulatory Visit (HOSPITAL_COMMUNITY)
Admission: RE | Admit: 2018-12-22 | Discharge: 2018-12-22 | Disposition: A | Discharge status: Home / Self Care | Payer: Medicare Other | Attending: Pulmonary Disease | Admitting: Pulmonary Disease

## 2018-12-22 ENCOUNTER — Encounter (HOSPITAL_COMMUNITY)
Admission: RE | Disposition: A | Discharge status: Home / Self Care | Payer: Self-pay | Source: Home / Self Care | Attending: Pulmonary Disease

## 2018-12-22 ENCOUNTER — Ambulatory Visit: Payer: Self-pay | Admitting: Pulmonary Disease

## 2018-12-22 DIAGNOSIS — E1051 Type 1 diabetes mellitus with diabetic peripheral angiopathy without gangrene: Secondary | ICD-10-CM | POA: Insufficient documentation

## 2018-12-22 DIAGNOSIS — I10 Essential (primary) hypertension: Secondary | ICD-10-CM | POA: Diagnosis not present

## 2018-12-22 DIAGNOSIS — F319 Bipolar disorder, unspecified: Secondary | ICD-10-CM | POA: Insufficient documentation

## 2018-12-22 DIAGNOSIS — Z8673 Personal history of transient ischemic attack (TIA), and cerebral infarction without residual deficits: Secondary | ICD-10-CM | POA: Diagnosis not present

## 2018-12-22 DIAGNOSIS — E7800 Pure hypercholesterolemia, unspecified: Secondary | ICD-10-CM | POA: Diagnosis not present

## 2018-12-22 DIAGNOSIS — Z794 Long term (current) use of insulin: Secondary | ICD-10-CM | POA: Diagnosis not present

## 2018-12-22 DIAGNOSIS — H409 Unspecified glaucoma: Secondary | ICD-10-CM | POA: Diagnosis not present

## 2018-12-22 DIAGNOSIS — D630 Anemia in neoplastic disease: Secondary | ICD-10-CM | POA: Diagnosis not present

## 2018-12-22 DIAGNOSIS — E1040 Type 1 diabetes mellitus with diabetic neuropathy, unspecified: Secondary | ICD-10-CM | POA: Insufficient documentation

## 2018-12-22 DIAGNOSIS — C771 Secondary and unspecified malignant neoplasm of intrathoracic lymph nodes: Secondary | ICD-10-CM | POA: Diagnosis not present

## 2018-12-22 DIAGNOSIS — F419 Anxiety disorder, unspecified: Secondary | ICD-10-CM | POA: Diagnosis not present

## 2018-12-22 DIAGNOSIS — Z79899 Other long term (current) drug therapy: Secondary | ICD-10-CM | POA: Insufficient documentation

## 2018-12-22 DIAGNOSIS — C801 Malignant (primary) neoplasm, unspecified: Secondary | ICD-10-CM | POA: Insufficient documentation

## 2018-12-22 DIAGNOSIS — E785 Hyperlipidemia, unspecified: Secondary | ICD-10-CM | POA: Diagnosis not present

## 2018-12-22 DIAGNOSIS — C3490 Malignant neoplasm of unspecified part of unspecified bronchus or lung: Secondary | ICD-10-CM

## 2018-12-22 DIAGNOSIS — R590 Localized enlarged lymph nodes: Secondary | ICD-10-CM | POA: Diagnosis not present

## 2018-12-22 DIAGNOSIS — Z87891 Personal history of nicotine dependence: Secondary | ICD-10-CM | POA: Diagnosis not present

## 2018-12-22 DIAGNOSIS — Z7982 Long term (current) use of aspirin: Secondary | ICD-10-CM | POA: Insufficient documentation

## 2018-12-22 HISTORY — PX: VIDEO BRONCHOSCOPY WITH ENDOBRONCHIAL ULTRASOUND: SHX6177

## 2018-12-22 HISTORY — DX: Localized enlarged lymph nodes: R59.0

## 2018-12-22 HISTORY — DX: Pneumonia, unspecified organism: J18.9

## 2018-12-22 HISTORY — DX: Presence of spectacles and contact lenses: Z97.3

## 2018-12-22 LAB — COMPREHENSIVE METABOLIC PANEL
ALK PHOS: 79 U/L (ref 38–126)
ALT: 12 U/L (ref 0–44)
AST: 17 U/L (ref 15–41)
Albumin: 3.2 g/dL — ABNORMAL LOW (ref 3.5–5.0)
Anion gap: 11 (ref 5–15)
BUN: 8 mg/dL (ref 8–23)
CO2: 23 mmol/L (ref 22–32)
Calcium: 8.7 mg/dL — ABNORMAL LOW (ref 8.9–10.3)
Chloride: 109 mmol/L (ref 98–111)
Creatinine, Ser: 1.27 mg/dL — ABNORMAL HIGH (ref 0.44–1.00)
GFR calc Af Amer: 51 mL/min — ABNORMAL LOW (ref >60)
GFR calc non Af Amer: 44 mL/min — ABNORMAL LOW (ref >60)
Glucose, Bld: 85 mg/dL (ref 70–99)
Potassium: 3.7 mmol/L (ref 3.5–5.1)
Sodium: 143 mmol/L (ref 135–145)
Total Bilirubin: 0.3 mg/dL (ref 0.3–1.2)
Total Protein: 7.2 g/dL (ref 6.5–8.1)

## 2018-12-22 LAB — CBC
HCT: 35.1 % — ABNORMAL LOW (ref 36.0–46.0)
Hemoglobin: 10.2 g/dL — ABNORMAL LOW (ref 12.0–15.0)
MCH: 24.1 pg — AB (ref 26.0–34.0)
MCHC: 29.1 g/dL — AB (ref 30.0–36.0)
MCV: 83 fL (ref 80.0–100.0)
Platelets: 338 10*3/uL (ref 150–400)
RBC: 4.23 MIL/uL (ref 3.87–5.11)
RDW: 17.5 % — ABNORMAL HIGH (ref 11.5–15.5)
WBC: 6.7 10*3/uL (ref 4.0–10.5)
nRBC: 0 % (ref 0.0–0.2)

## 2018-12-22 LAB — APTT: aPTT: 35 seconds (ref 24–36)

## 2018-12-22 LAB — PROTIME-INR
INR: 1.02
Prothrombin Time: 13.3 seconds (ref 11.4–15.2)

## 2018-12-22 LAB — GLUCOSE, CAPILLARY
Glucose-Capillary: 78 mg/dL (ref 70–99)
Glucose-Capillary: 79 mg/dL (ref 70–99)

## 2018-12-22 SURGERY — BRONCHOSCOPY, WITH EBUS
Anesthesia: General

## 2018-12-22 MED ORDER — ONDANSETRON HCL 4 MG/2ML IJ SOLN
INTRAMUSCULAR | Status: AC
  Filled 2018-12-22: qty 2

## 2018-12-22 MED ORDER — LACTATED RINGERS IV SOLN: INTRAVENOUS | Status: DC

## 2018-12-22 MED ORDER — LIDOCAINE HCL (PF) 1 % IJ SOLN: INTRAMUSCULAR | Status: DC | PRN

## 2018-12-22 MED ORDER — PROPOFOL 10 MG/ML IV BOLUS
INTRAVENOUS | Status: DC | PRN
  Administered 2018-12-22: 150 mg via INTRAVENOUS
  Administered 2018-12-22: 50 mg via INTRAVENOUS

## 2018-12-22 MED ORDER — DEXAMETHASONE SODIUM PHOSPHATE 10 MG/ML IJ SOLN
INTRAMUSCULAR | Status: AC
  Filled 2018-12-22: qty 1

## 2018-12-22 MED ORDER — FENTANYL CITRATE (PF) 250 MCG/5ML IJ SOLN
INTRAMUSCULAR | Status: AC
  Filled 2018-12-22: qty 5

## 2018-12-22 MED ORDER — ONDANSETRON HCL 4 MG/2ML IJ SOLN
INTRAMUSCULAR | Status: DC | PRN
  Administered 2018-12-22: 4 mg via INTRAVENOUS

## 2018-12-22 MED ORDER — LIDOCAINE 2% (20 MG/ML) 5 ML SYRINGE
INTRAMUSCULAR | Status: AC
  Filled 2018-12-22: qty 5

## 2018-12-22 MED ORDER — DEXAMETHASONE SODIUM PHOSPHATE 10 MG/ML IJ SOLN
INTRAMUSCULAR | Status: DC | PRN
  Administered 2018-12-22: 10 mg via INTRAVENOUS

## 2018-12-22 MED ORDER — EPHEDRINE SULFATE-NACL 50-0.9 MG/10ML-% IV SOSY
PREFILLED_SYRINGE | INTRAVENOUS | Status: DC | PRN
  Administered 2018-12-22: 10 mg via INTRAVENOUS

## 2018-12-22 MED ORDER — FENTANYL CITRATE (PF) 100 MCG/2ML IJ SOLN: 25.0000 ug | INTRAMUSCULAR | Status: DC | PRN

## 2018-12-22 MED ORDER — ROCURONIUM BROMIDE 50 MG/5ML IV SOSY
PREFILLED_SYRINGE | INTRAVENOUS | Status: AC
  Filled 2018-12-22: qty 5

## 2018-12-22 MED ORDER — ROCURONIUM BROMIDE 10 MG/ML (PF) SYRINGE
PREFILLED_SYRINGE | INTRAVENOUS | Status: DC | PRN
  Administered 2018-12-22: 50 mg via INTRAVENOUS
  Administered 2018-12-22: 40 mg via INTRAVENOUS

## 2018-12-22 MED ORDER — LIDOCAINE 2% (20 MG/ML) 5 ML SYRINGE
INTRAMUSCULAR | Status: DC | PRN
  Administered 2018-12-22: 60 mg via INTRAVENOUS

## 2018-12-22 MED ORDER — 0.9 % SODIUM CHLORIDE (POUR BTL) OPTIME
TOPICAL | Status: DC | PRN
  Administered 2018-12-22: 1000 mL

## 2018-12-22 MED ORDER — EPHEDRINE 5 MG/ML INJ
INTRAVENOUS | Status: AC
  Filled 2018-12-22: qty 10

## 2018-12-22 MED ORDER — MIDAZOLAM HCL 5 MG/5ML IJ SOLN
INTRAMUSCULAR | Status: DC | PRN
  Administered 2018-12-22: 2 mg via INTRAVENOUS

## 2018-12-22 MED ORDER — PROPOFOL 10 MG/ML IV BOLUS
INTRAVENOUS | Status: AC
  Filled 2018-12-22: qty 20

## 2018-12-22 MED ORDER — LACTATED RINGERS IV SOLN
INTRAVENOUS | Status: DC | PRN
  Administered 2018-12-22: 12:00:00 via INTRAVENOUS

## 2018-12-22 MED ORDER — LIDOCAINE HCL (PF) 1 % IJ SOLN
INTRAMUSCULAR | Status: AC
  Filled 2018-12-22: qty 30

## 2018-12-22 MED ORDER — ACETAMINOPHEN 500 MG PO TABS
1000.0000 mg | ORAL_TABLET | Freq: Once | ORAL | Status: AC
  Administered 2018-12-22: 1000 mg via ORAL

## 2018-12-22 MED ORDER — SUGAMMADEX SODIUM 200 MG/2ML IV SOLN
INTRAVENOUS | Status: DC | PRN
  Administered 2018-12-22: 200 mg via INTRAVENOUS

## 2018-12-22 MED ORDER — ACETAMINOPHEN 500 MG PO TABS
ORAL_TABLET | ORAL | Status: AC
  Administered 2018-12-22: 1000 mg via ORAL
  Filled 2018-12-22: qty 2

## 2018-12-22 MED ORDER — MIDAZOLAM HCL 2 MG/2ML IJ SOLN
INTRAMUSCULAR | Status: AC
  Filled 2018-12-22: qty 2

## 2018-12-22 MED ORDER — FENTANYL CITRATE (PF) 100 MCG/2ML IJ SOLN
INTRAMUSCULAR | Status: DC | PRN
  Administered 2018-12-22: 100 ug via INTRAVENOUS
  Administered 2018-12-22: 50 ug via INTRAVENOUS

## 2018-12-22 SURGICAL SUPPLY — 35 items
ADAPTER VALVE BIOPSY EBUS (MISCELLANEOUS) IMPLANT
ADPTR VALVE BIOPSY EBUS (MISCELLANEOUS)
BALLN FOR EBUS SCOPE (BALLOONS) ×3
BALLOON FOR EBUS SCOPE (BALLOONS) ×1 IMPLANT
BRUSH CYTOL CELLEBRITY 1.5X140 (MISCELLANEOUS) IMPLANT
CANISTER SUCT 3000ML PPV (MISCELLANEOUS) ×3 IMPLANT
CONT SPEC 4OZ CLIKSEAL STRL BL (MISCELLANEOUS) ×3 IMPLANT
COVER BACK TABLE 60X90IN (DRAPES) ×3 IMPLANT
FILTER STRAW FLUID ASPIR (MISCELLANEOUS) IMPLANT
FORCEPS BIOP RJ4 1.8 (CUTTING FORCEPS) IMPLANT
GAUZE SPONGE 4X4 12PLY STRL (GAUZE/BANDAGES/DRESSINGS) ×3 IMPLANT
GLOVE SURG SS PI 7.5 STRL IVOR (GLOVE) ×6 IMPLANT
GOWN STRL REUS W/ TWL LRG LVL3 (GOWN DISPOSABLE) ×2 IMPLANT
GOWN STRL REUS W/TWL LRG LVL3 (GOWN DISPOSABLE) ×4
KIT CLEAN ENDO COMPLIANCE (KITS) ×6 IMPLANT
KIT TURNOVER KIT B (KITS) ×3 IMPLANT
MARKER SKIN DUAL TIP RULER LAB (MISCELLANEOUS) ×3 IMPLANT
NEEDLE ASPIRATION VIZISHOT 19G (NEEDLE) ×3 IMPLANT
NEEDLE ASPIRATION VIZISHOT 21G (NEEDLE) IMPLANT
NS IRRIG 1000ML POUR BTL (IV SOLUTION) ×3 IMPLANT
OIL SILICONE PENTAX (PARTS (SERVICE/REPAIRS)) ×3 IMPLANT
PAD ARMBOARD 7.5X6 YLW CONV (MISCELLANEOUS) ×6 IMPLANT
STOPCOCK 4 WAY LG BORE MALE ST (IV SETS) ×3 IMPLANT
SYR 20CC LL (SYRINGE) ×3 IMPLANT
SYR 20ML ECCENTRIC (SYRINGE) ×9 IMPLANT
SYR 3ML LL SCALE MARK (SYRINGE) IMPLANT
SYR 50ML SLIP (SYRINGE) ×3 IMPLANT
SYR 5ML LL (SYRINGE) ×3 IMPLANT
TRAP SPECIMEN MUCOUS 40CC (MISCELLANEOUS) IMPLANT
TUBE CONNECTING 20'X1/4 (TUBING) ×1
TUBE CONNECTING 20X1/4 (TUBING) ×2 IMPLANT
VALVE BIOPSY  SINGLE USE (MISCELLANEOUS) ×2
VALVE BIOPSY SINGLE USE (MISCELLANEOUS) ×1 IMPLANT
VALVE SUCTION BRONCHIO DISP (MISCELLANEOUS) ×3 IMPLANT
WATER STERILE IRR 1000ML POUR (IV SOLUTION) ×3 IMPLANT

## 2018-12-22 NOTE — Interval H&P Note (Signed)
History and Physical Interval Note:  12/22/2018 12:09 PM  Tara Hopkins  has presented today for surgery, with the diagnosis of Mediastinal Adenopathy  The various methods of treatment have been discussed with the patient and family. After consideration of risks, benefits and other options for treatment, the patient has consented to  Procedure(s): [place] (N/A) as a surgical intervention .  The patient's history has been reviewed, patient examined, no change in status, stable for surgery.  I have reviewed the patient's chart and labs.  Questions were answered to the patient's satisfaction.     Patient seen in pre-op. All questions answered. 325mg  ASA stopped last week. Patient with no concerns. Discussed risk, benefits and alternatives with the patient as well as family at bedside. No barriers to proceed.   [name], DO [facility] 12/22/2018 12:09 PM

## 2018-12-22 NOTE — Discharge Instructions (Signed)
Flexible Bronchoscopy, Care After This sheet gives you information about how to care for yourself after your procedure. Your health care provider may also give you more specific instructions. If you have problems or questions, contact your health care provider. What can I expect after the procedure? After the procedure, it is common to have the following symptoms for 24-48 hours:  A cough that is worse than it was before the procedure.  A low-grade fever.  A sore throat or hoarse voice.  Small streaks of blood in the mucus from your lungs (sputum), if tissue samples were removed (biopsy). Follow these instructions at home: Eating and drinking  The day after the procedure, return to your normal diet. Driving  Do not drive for 24 hours if you were given a medicine to help you relax (sedative).  Do not drive or use heavy machinery while taking prescription pain medicine. General instructions   Take over-the-counter and prescription medicines only as told by your health care provider.  Return to your normal activities as told by your health care provider. Ask your health care provider what activities are safe for you.  Do not use any products that contain nicotine or tobacco, such as cigarettes and e-cigarettes. If you need help quitting, ask your health care provider.  Keep all follow-up visits as told by your health care provider. This is important, especially if you had a biopsy taken. Get help right away if:  You have shortness of breath that gets worse.  You become light-headed or feel like you might faint.  You have chest pain.  You cough up more than a small amount of blood.  The amount of blood you cough up increases. Summary  Common symptoms in the 24-48 hours following a flexible bronchoscopy include cough, low-grade fever, sore throat or hoarse voice, and blood-streaked mucus from the lungs (if you had a biopsy).  Do not eat or drink anything (including water) for  2 hours after your procedure, or until your local anesthetic has worn off. You can return to your normal diet the day after the procedure.  Get help right away if you develop worsening shortness of breath, have chest pain, become light-headed, or cough up more than a small amount of blood. This information is not intended to replace advice given to you by your health care provider. Make sure you discuss any questions you have with your health care provider. Document Released: 05/15/2005 Document Revised: 11/13/2016 Document Reviewed: 11/13/2016 Elsevier Interactive Patient Education  2019 [employer].

## 2018-12-22 NOTE — Op Note (Signed)
Video Bronchoscopy with Endobronchial Ultrasound Procedure Note  Date of Operation: 12/22/2018  Pre-op Diagnosis: PET avid mediastinal adenopathy  Post-op Diagnosis: PET avid mediastinal adenopathy  Surgeon: [name], DO   Assistants: None   Anesthesia: General endotracheal anesthesia  Operation: Flexible video fiberoptic bronchoscopy with endobronchial ultrasound and biopsies.  Estimated Blood Loss: Minimal, <6YI   Complications: None   Indications and History: Tara Hopkins is a 68 y.o. female with large PET avid mediastinal adenopathy, hilar adenopathy and mediastinal mass.  The risks, benefits, complications, treatment options and expected outcomes were discussed with the patient.  The possibilities of pneumothorax, pneumonia, reaction to medication, pulmonary aspiration, perforation of a viscus, bleeding, failure to diagnose a condition and creating a complication requiring transfusion or operation were discussed with the patient who freely signed the consent.    Description of Procedure: The patient was examined in the preoperative area and history and data from the preprocedure consultation were reviewed. It was deemed appropriate to proceed.  The patient was taken to [facility], identified as [name] and the procedure verified as Flexible Video Fiberoptic Bronchoscopy.  A Time Out was held and the above information confirmed. After being taken to the operating room general anesthesia was initiated and the patient  was orally intubated. The video fiberoptic bronchoscope was introduced via the endotracheal tube and a general inspection was performed which showed erythematous and hyperemic mucosa along the anterior medial right mainstem as well as the left upper lobe bronchial mucosa.  Visualization under narrowband imaging revealed significant angiogenesis.  This was concerning for tumor infiltration within the bronchial mucosa. The standard scope was then withdrawn and  the endobronchial ultrasound was used to identify and characterize the peritracheal, hilar and bronchial lymph nodes. Inspection showed enlarged 4R as well as station 7 lymph node, the largest cross-section visualized within station 7 was approximately 3.5 cm in diameter. Using real-time ultrasound guidance Wang needle biopsies were take from Station 7 node and were sent for cytology. The patient tolerated the procedure well without apparent complications. There was no significant blood loss. The bronchoscope was withdrawn. Anesthesia was reversed and the patient was taken to the PACU for recovery.   Samples: 1. Wang needle biopsies from station 7 lymph node  Preliminary pathology: Small blue cell malignancy.  Plans:  The patient will be discharged from the PACU to home when recovered from anesthesia. We will review the cytology, pathology and microbiology results with the patient when they become available. Outpatient followup will be with [name], DO.  [name], DO [facility] 12/22/2018 2:03 PM

## 2018-12-22 NOTE — Transfer of Care (Signed)
Immediate Anesthesia Transfer of Care Note  [name]  Procedure(s) Performed: VIDEO BRONCHOSCOPY WITH ENDOBRONCHIAL ULTRASOUND (N/A )  Patient Location: PACU  Anesthesia Type:General  Level of Consciousness: drowsy and patient cooperative  Airway & Oxygen Therapy: Patient Spontanous Breathing and Patient connected to face mask oxygen  Post-op Assessment: Report given to RN and Post -op Vital signs reviewed and stable  Post vital signs: Reviewed and stable  Last Vitals:  Vitals Value Taken Time  BP 166/78 12/22/2018  2:01 PM  Temp 36.5 C 12/22/2018  2:00 PM  Pulse 80 12/22/2018  2:04 PM  Resp 18 12/22/2018  2:04 PM  SpO2 100 % 12/22/2018  2:04 PM  Vitals shown include unvalidated device data.  Last Pain:  Vitals:   12/22/18 1400  TempSrc:   PainSc: 0-No pain         Complications: No apparent anesthesia complications

## 2018-12-22 NOTE — Anesthesia Procedure Notes (Signed)
Procedure Name: Intubation Performed by: Milford Cage, CRNA Pre-anesthesia Checklist: Patient identified, Emergency Drugs available, Suction available and Patient being monitored Patient Re-evaluated:Patient Re-evaluated prior to induction Oxygen Delivery Method: Circle System Utilized Preoxygenation: Pre-oxygenation with 100% oxygen Induction Type: IV induction Ventilation: Mask ventilation without difficulty Laryngoscope Size: Mac and 3 Grade View: Grade I Tube type: Oral Tube size: 8.0 mm Number of attempts: 1 Airway Equipment and Method: Stylet and Oral airway Placement Confirmation: ETT inserted through vocal cords under direct vision,  positive ETCO2 and breath sounds checked- equal and bilateral Secured at: 22 cm Tube secured with: Tape Dental Injury: Teeth and Oropharynx as per pre-operative assessment

## 2018-12-22 NOTE — Progress Notes (Signed)
Patient stated that they will not be able to arrive until 1030. Dr. [name] and OR desk aware.

## 2018-12-23 ENCOUNTER — Telehealth: Payer: Self-pay | Admitting: *Deleted

## 2018-12-23 ENCOUNTER — Encounter: Payer: Self-pay | Admitting: *Deleted

## 2018-12-23 ENCOUNTER — Encounter (HOSPITAL_COMMUNITY): Payer: Self-pay | Admitting: Pulmonary Disease

## 2018-12-23 DIAGNOSIS — E113411 Type 2 diabetes mellitus with severe nonproliferative diabetic retinopathy with macular edema, right eye: Secondary | ICD-10-CM | POA: Diagnosis not present

## 2018-12-23 NOTE — Anesthesia Postprocedure Evaluation (Signed)
Anesthesia Post Note  Patient: [name]  Procedure(s) Performed: VIDEO BRONCHOSCOPY WITH ENDOBRONCHIAL ULTRASOUND (N/A )     Patient location during evaluation: PACU Anesthesia Type: General Level of consciousness: awake and alert Pain management: pain level controlled Vital Signs Assessment: post-procedure vital signs reviewed and stable Respiratory status: spontaneous breathing, nonlabored ventilation and respiratory function stable Cardiovascular status: blood pressure returned to baseline and stable Postop Assessment: no apparent nausea or vomiting Anesthetic complications: no    Last Vitals:  Vitals:   12/22/18 1501 12/22/18 1515  BP: (!) 152/72 127/71  Pulse: 77 80  Resp: 13 19  Temp:    SpO2: 95% 93%    Last Pain:  Vitals:   12/22/18 1500  TempSrc:   PainSc: 0-No pain                 Zamire Whitehurst,W. EDMOND

## 2018-12-23 NOTE — Telephone Encounter (Signed)
Oncology Nurse Navigator Documentation  Oncology Nurse Navigator Flowsheets 12/23/2018  Navigator Location [facility]  Referral date to RadOnc/MedOnc 12/22/2018  Navigator Encounter Type Telephone/I received referral on Tara Hopkins.  I called to schedule her to be seen at [facility] next week.  I was unable to reach but did leave a vm message with my name and phone number to call.   Telephone Outgoing Call  Barriers/Navigation Needs Education;Coordination of Care  Education Other  Interventions Coordination of Care;Education  Coordination of Care Other  Education Method Verbal  Acuity Level 2  Time Spent with Patient 30

## 2018-12-23 NOTE — Progress Notes (Signed)
Tara Hopkins called me back.  I updated her on the appt 12/29/2018.  She verbalized understanding of appt time and place.

## 2018-12-27 ENCOUNTER — Telehealth: Payer: Self-pay | Admitting: Family Medicine

## 2018-12-27 ENCOUNTER — Telehealth: Payer: Self-pay | Admitting: Pulmonary Disease

## 2018-12-27 ENCOUNTER — Other Ambulatory Visit: Payer: Self-pay | Admitting: Neurology

## 2018-12-27 ENCOUNTER — Telehealth: Payer: Self-pay | Admitting: Neurology

## 2018-12-27 DIAGNOSIS — Z01818 Encounter for other preprocedural examination: Secondary | ICD-10-CM | POA: Diagnosis not present

## 2018-12-27 DIAGNOSIS — E113521 Type 2 diabetes mellitus with proliferative diabetic retinopathy with traction retinal detachment involving the macula, right eye: Secondary | ICD-10-CM | POA: Diagnosis not present

## 2018-12-27 DIAGNOSIS — R569 Unspecified convulsions: Secondary | ICD-10-CM

## 2018-12-27 DIAGNOSIS — H33051 Total retinal detachment, right eye: Secondary | ICD-10-CM | POA: Diagnosis not present

## 2018-12-27 DIAGNOSIS — H3341 Traction detachment of retina, right eye: Secondary | ICD-10-CM | POA: Diagnosis not present

## 2018-12-27 NOTE — Telephone Encounter (Signed)
Please call patient for a follow up visit with Sarah in 1-2 weeks.

## 2018-12-27 NOTE — Telephone Encounter (Signed)
The patient's daughter returned the call and her mother is schedule on 01/13/2019.

## 2018-12-27 NOTE — Telephone Encounter (Signed)
Copied from [place] [id number]. Topic: General - Other >> Dec 26, 2018  9:29 AM Alanda Slim E wrote: Reason for CRM: Pt waould like Tommi Rumps to call her as soon as he can. She stated she missed his call in reference to some test she had done/ please advise

## 2018-12-27 NOTE — Telephone Encounter (Signed)
PCCM:  I called and spoke with the patients daughter. The patient had retina surgery today.  Final path positive for small cell.   Diagnosis FINE NEEDLE ASPIRATION, EBUS 7 NODE, A (SPECIMEN 1 OF 1, COLLECTED 12/22/18): - SMALL CELL CARCINOMA, SEE COMMENT.  [name], DO [facility] 12/27/2018 5:52 PM    CC: Norton Blizzard and Dr. Julien Nordmann

## 2018-12-27 NOTE — Telephone Encounter (Signed)
-----   Message from Rosalin Hawking, MD sent at 12/27/2018  8:50 AM EST ----- Regarding: RE: New seizure Hi, James:  Thank you for letting us know. Apparently, the seizure was missed. She saw Dr. Krista Blue at [facility] in 2016, I will forward the message to her for possible follow up on this issue. Thanks much.  Rosalin Hawking, MD PhD Stroke Neurology 12/27/2018 8:51 AM  ----- Message ----- From: Claud Kelp Sent: 12/21/2018  12:03 PM EST To: Rosalin Hawking, MD Subject: New seizure                                    Dr. [name],  It looks like you have previously followed Ms. Fallert for CVA with residual L side weakness. I was reviewing her chart today for EBUS scheduled tomorrow. She was seen in the ED on 10/16/18 with new onset seizure. At the time she was also found to have mediastinal adenopathy. In her workup for lung CA it looks like the new seizure got lost. I called her and she has not been taking the Keppra that was prescribed in the ED. She does not have any neurology followup scheduled. I wanted to make you aware as it looks to be something that will need followup. I advised her to call your office as well, but I'm not completely confident that she will.  Wynonia Musty [facility] Short Stay Center/Anesthesiology Phone [phone] 12/21/2018 12:09 PM

## 2018-12-27 NOTE — Telephone Encounter (Signed)
I have attempted to reach the patient, multiple times, without success.  Her phone rings repeatedly then eventually a recording states that her mailbox is full.  I have also reached out to her daughter, Jake Seats, and left a message requesting a call back to schedule an appt (ok per DPR on file).   When patient or daughter returns our call, please schedule her with Butler Denmark, NP.

## 2018-12-28 ENCOUNTER — Other Ambulatory Visit: Payer: Self-pay | Admitting: Adult Health

## 2018-12-28 ENCOUNTER — Other Ambulatory Visit: Payer: Self-pay | Admitting: Medical Oncology

## 2018-12-28 ENCOUNTER — Ambulatory Visit (HOSPITAL_COMMUNITY)
Admission: RE | Admit: 2018-12-28 | Discharge: 2018-12-28 | Disposition: A | Discharge status: Home / Self Care | Payer: Medicare Other | Source: Ambulatory Visit | Attending: Pulmonary Disease | Admitting: Pulmonary Disease

## 2018-12-28 DIAGNOSIS — C3490 Malignant neoplasm of unspecified part of unspecified bronchus or lung: Secondary | ICD-10-CM | POA: Diagnosis not present

## 2018-12-28 DIAGNOSIS — R918 Other nonspecific abnormal finding of lung field: Secondary | ICD-10-CM

## 2018-12-28 MED ORDER — GADOBUTROL 1 MMOL/ML IV SOLN
7.0000 mL | Freq: Once | INTRAVENOUS | Status: AC | PRN
  Administered 2018-12-28: 7 mL via INTRAVENOUS

## 2018-12-29 ENCOUNTER — Inpatient Hospital Stay: Payer: Medicare Other

## 2018-12-29 ENCOUNTER — Ambulatory Visit: Payer: Self-pay | Admitting: Internal Medicine

## 2018-12-29 ENCOUNTER — Ambulatory Visit: Payer: Medicare Other | Admitting: Radiation Oncology

## 2018-12-29 ENCOUNTER — Telehealth: Payer: Self-pay | Admitting: *Deleted

## 2018-12-29 DIAGNOSIS — C3411 Malignant neoplasm of upper lobe, right bronchus or lung: Secondary | ICD-10-CM | POA: Insufficient documentation

## 2018-12-29 NOTE — Telephone Encounter (Signed)
Received TC from patient stating that she needed to cancel today's appt with Dr. Julien Nordmann and [place] due to transportation issues.

## 2018-12-30 ENCOUNTER — Other Ambulatory Visit: Payer: Self-pay | Admitting: Adult Health

## 2018-12-30 ENCOUNTER — Telehealth: Payer: Self-pay | Admitting: Internal Medicine

## 2018-12-30 ENCOUNTER — Telehealth: Payer: Self-pay | Admitting: Adult Health

## 2018-12-30 ENCOUNTER — Encounter: Payer: Self-pay | Admitting: Internal Medicine

## 2018-12-30 MED ORDER — TRAMADOL HCL 50 MG PO TABS: 50.0000 mg | ORAL_TABLET | Freq: Two times a day (BID) | ORAL | 0 refills | Status: DC | PRN

## 2018-12-30 NOTE — Telephone Encounter (Signed)
Copied from [place] [id number]. Topic: Quick Communication - See Telephone Encounter >> Dec 30, 2018  4:47 PM Blase Mess A wrote: CRM for notification. See Telephone encounter for: 12/30/18.  [facility] is calling to see if willing to dispence a 3 day supply of traMADol (ULTRAM) 50 MG tablet [[id number]] . First opid use. To decrease chances of low term use. Wanting to know if willing to decrease the quantity? Please advise. [phone]

## 2018-12-30 NOTE — Telephone Encounter (Signed)
Copied from [place] [id number]. Topic: Quick Communication - See Telephone Encounter >> Dec 30, 2018  1:35 PM Blase Mess A wrote: CRM for notification. See Telephone encounter for: 12/30/18.  Patient states that she missed Cory's call regarding a prescription. Please advise. Thank you

## 2018-12-30 NOTE — Telephone Encounter (Signed)
A new patient appt has been scheduled for the pt to see Dr. Julien Nordmann on 2/28 at 930am w/labs at 9am. I called and left the appt date and time on the pt's voicemail. Letter mailed.

## 2019-01-02 ENCOUNTER — Telehealth: Payer: Self-pay | Admitting: Medical Oncology

## 2019-01-02 NOTE — Telephone Encounter (Signed)
Pt called to follow up on pain medication for her hand. Please advise.

## 2019-01-02 NOTE — Telephone Encounter (Signed)
She confirmed appt for Friday.

## 2019-01-03 NOTE — Telephone Encounter (Signed)
Rx sent in on 12/30/2018 by [facility].  Nothing further needed.

## 2019-01-04 ENCOUNTER — Telehealth: Payer: Self-pay | Admitting: *Deleted

## 2019-01-04 NOTE — Telephone Encounter (Signed)
Oncology Nurse Navigator Documentation  Oncology Nurse Navigator Flowsheets 01/04/2019  Navigator Location [facility]  Navigator Encounter Type Telephone/I called Tara Hopkins to follow up with her.  She missed her clinic appt last week but is scheduled for another appt with Dr. Julien Nordmann on 01/06/2019.  I was unable to reach her or leave a vm message with her or her daughter.    Telephone Outgoing Call  Barriers/Navigation Needs Coordination of Care  Interventions Coordination of Care  Acuity Level 1  Time Spent with Patient 15

## 2019-01-05 NOTE — Progress Notes (Deleted)
[name] DOB: 1951-04-21  REASON FOR VISIT: follow up HISTORY FROM: patient  HISTORY OF PRESENT ILLNESS: Today 01/13/19  HISTORY Tara Hopkins is a 68 year old female who was seen in the emergency department on October 16, 2018 for a new seizure.  Apparently had a seizure lasted about 30 to 120 seconds, bladder incontinence, rhythmic jerking, loss of consciousness was witnessed.  This was a single event.  She was loaded with 1 g of IV Keppra and discharged home with a prescription to take Keppra 500 mg twice daily. She had MRI of the brain in February 2020 for seizures, generalized atrophy, advanced small vessel disease.  She was seen in this office in August 2016 by Dr. Krista Blue with and MRI of the brain showed right lenticular nucleus/posterior radiata infarction, punctuated left internal capsule infarction, likely due to small vessel disease. She presented with residual mild left sided weakness.  MRI of the brain showed intracranial atherosclerotic disease, no significant large vessel stenosis.   In 2018 she presented to the ED with increasing left extremity weakness, slurred speech. MRI of brain showed acute/early subacute infarction in right posterior limb of internal capsule. Moderate chronic microvascular ischemic changes and moderate parenchymal volume loss of brain. Chronic lacunar infaracts of basal ganglia and pons. MRA head neck showed right M1 and left P2 stenosis.   She has been evaluated by pulmonology for mass on her right lung.CT scan showed bulky mediastinal lymphadenopathy imaging features compatible with metastatic disease or lymphoma.  She was to have a EBUS procedure February 13, eye procedure February 14 for detached retina.  MRI of the brain in February 2020 showed no evidence of metastatic disease to the brain.  She will be starting chemotherapy and radiation for lung cancer on January 16, 2019    REVIEW OF SYSTEMS: Out of a complete 14 system review of  symptoms, the patient complains only of the following symptoms, and all other reviewed systems are negative.  ALLERGIES: No Known Allergies  HOME MEDICATIONS: Outpatient Medications Prior to Visit  Medication Sig Dispense Refill  . aspirin 325 MG EC tablet Take 1 tablet (325 mg total) by mouth daily. 30 tablet 0  . aspirin EC 81 MG tablet Take 81 mg by mouth daily.    Marland Kitchen atorvastatin (LIPITOR) 40 MG tablet Take 1 tablet (40 mg total) by mouth daily at 6 PM. 90 tablet 0  . citalopram (CELEXA) 10 MG tablet Take 1 tablet (10 mg total) by mouth daily. 90 tablet 1  . clonazePAM (KLONOPIN) 0.5 MG tablet Take 1 tablet (0.5 mg total) by mouth 3 (three) times daily. 90 tablet 1  . gabapentin (NEURONTIN) 400 MG capsule TAKE 3 CAPSULES BY MOUTH THREE TIMES A DAY (Patient taking differently: Take 1,200 mg by mouth 3 (three) times daily. TAKE 3 CAPSULES BY MOUTH THREE TIMES A DAY) 270 capsule 2  . glimepiride (AMARYL) 4 MG tablet TAKE 1 TABLET BY MOUTH EVERY DAY( NEEDS YEARLY PHYSICAL WITH CORY) (Patient taking differently: Take 4 mg by mouth daily with breakfast. ) 90 tablet 0  . glucose blood (ONETOUCH VERIO) test strip USE TO TEST BLOOD GLUCOSE 3 TIMES DAILY 100 each 12  . Insulin Glargine (LANTUS SOLOSTAR) 100 UNIT/ML Solostar Pen Inject 25 Units into the skin at bedtime. (Patient taking differently: Inject 40 Units into the skin at bedtime. ) 15 mL 6  . Insulin Pen Needle (B-D UF III MINI PEN NEEDLES) 31G X 5 MM MISC Use as directed. 100 each 3  .  latanoprost (XALATAN) 0.005 % ophthalmic solution INSTILL 1 DROP INTO BOTH EYES EVERY EVENING (Patient taking differently: Place 1 drop into both eyes at bedtime. ) 7.5 mL 0  . lisinopril (PRINIVIL,ZESTRIL) 40 MG tablet Take 1 tablet (40 mg total) by mouth daily. 90 tablet 1  . ONE TOUCH LANCETS MISC USE TO CHECK BLOOD SUGAR 3 TIMES DAILY 200 each 3  . prochlorperazine (COMPAZINE) 10 MG tablet Take 1 tablet (10 mg total) by mouth every 6 (six) hours as needed  for nausea or vomiting. 30 tablet 0  . traMADol (ULTRAM) 50 MG tablet Take 1 tablet (50 mg total) by mouth every 12 (twelve) hours as needed. 30 tablet 0   No facility-administered medications prior to visit.     PAST MEDICAL HISTORY: Past Medical History:  Diagnosis Date  . Anemia   . Anxiety   . Bipolar 1 disorder ([place])   . Cataracts, bilateral   . Depression   . Diabetes mellitus without complication ([place])   . Glaucoma   . History of blood transfusion   . History of hyperbaric oxygen therapy    pt reports 80 treatments.  . Hypercholesteremia   . Hypertension   . Mediastinal adenopathy   . Peripheral neuropathy   . Pneumonia   . Stroke ([place]) 02/2015  . Wears glasses     PAST SURGICAL HISTORY: Past Surgical History:  Procedure Laterality Date  . AMPUTATION Right 12/15/2013   Procedure: Right Transmetatarsal Amputation;  Surgeon: Newt Minion, MD;  Location: [facility];  Service: Orthopedics;  Laterality: Right;  . AMPUTATION Right 02/23/2014   Procedure: AMPUTATION FOOT;  Surgeon: Newt Minion, MD;  Location: [facility];  Service: Orthopedics;  Laterality: Right;  Right Foot Revision Transmetatarsal Amputation  . CATARACT EXTRACTION W/ INTRAOCULAR LENS  IMPLANT, BILATERAL    . COLONOSCOPY W/ BIOPSIES AND POLYPECTOMY    . I&D EXTREMITY Right 05/23/2013   Procedure: IRRIGATION AND DEBRIDEMENT RIGHT GREAT TOE;  Surgeon: Mauri Pole, MD;  Location: [facility];  Service: Orthopedics;  Laterality: Right;  . TUBAL LIGATION    . VIDEO BRONCHOSCOPY WITH ENDOBRONCHIAL ULTRASOUND N/A 12/22/2018   Procedure: VIDEO BRONCHOSCOPY WITH ENDOBRONCHIAL ULTRASOUND;  Surgeon: [name], DO;  Location: [facility];  Service: Thoracic;  Laterality: N/A;    FAMILY HISTORY: Family History  Problem Relation Age of Onset  . Liver cancer Mother   . Lung cancer Mother        smoked  . Depression Mother   . Hypertension Father   . Depression Father   . Colon cancer Neg Hx     SOCIAL HISTORY: Social  History   Socioeconomic History  . Marital status: Married    Spouse name: Not on file  . Number of children: 3  . Years of education: 50  . Highest education level: Not on file  Occupational History  . Occupation: Disbaled  Social Needs  . Financial resource strain: Not on file  . Food insecurity:    Worry: Not on file    Inability: Not on file  . Transportation needs:    Medical: Not on file    Non-medical: Not on file  Tobacco Use  . Smoking status: Former Smoker    Packs/day: 0.50    Years: 40.00    Pack years: 20.00    Types: Cigarettes    Last attempt to quit: 10/17/2018    Years since quitting: 0.2  . Smokeless tobacco: Never Used  Substance and Sexual Activity  .  Alcohol use: Not Currently    Alcohol/week: 0.0 standard drinks  . Drug use: Yes    Types: Cocaine, Marijuana    Comment: last use of cocaine last use 12/09/2018  . Sexual activity: Not on file  Lifestyle  . Physical activity:    Days per week: Not on file    Minutes per session: Not on file  . Stress: Not on file  Relationships  . Social connections:    Talks on phone: Not on file    Gets together: Not on file    Attends religious service: Not on file    Active member of club or organization: Not on file    Attends meetings of clubs or organizations: Not on file    Relationship status: Not on file  . Intimate partner violence:    Fear of current or ex partner: Not on file    Emotionally abused: Not on file    Physically abused: Not on file    Forced sexual activity: Not on file  Other Topics Concern  . Not on file  Social History Narrative   Lives at home alone.   Right-handed.   No caffeine use.   Oldest of 12 children       PHYSICAL EXAM  There were no vitals filed for this visit. There is no height or weight on file to calculate BMI.  Generalized: Well developed, in no acute distress   Neurological examination  Mentation: Alert oriented to time, place, history taking. Follows  all commands speech and language fluent Cranial nerve II-XII: Pupils were equal round reactive to light. Extraocular movements were full, visual field were full on confrontational test. Facial sensation and strength were normal. Uvula tongue midline. Head turning and shoulder shrug  were normal and symmetric. Motor: The motor testing reveals 5 over 5 strength of all 4 extremities. Good symmetric motor tone is noted throughout.  Sensory: Sensory testing is intact to soft touch on all 4 extremities. No evidence of extinction is noted.  Coordination: Cerebellar testing reveals good finger-nose-finger and heel-to-shin bilaterally.  Gait and station: Gait is normal. Tandem gait is normal. Romberg is negative. No drift is seen.  Reflexes: Deep tendon reflexes are symmetric and normal bilaterally.   DIAGNOSTIC DATA (LABS, IMAGING, TESTING) - I reviewed patient records, labs, notes, testing and imaging myself where available.  Lab Results  Component Value Date   WBC 7.8 01/06/2019   HGB 11.5 (L) 01/06/2019   HCT 38.6 01/06/2019   MCV 82.1 01/06/2019   PLT 470 (H) 01/06/2019      Component Value Date/Time   NA 142 01/06/2019 0922   K 4.0 01/06/2019 0922   CL 106 01/06/2019 0922   CO2 26 01/06/2019 0922   GLUCOSE 93 01/06/2019 0922   BUN 13 01/06/2019 0922   CREATININE 1.21 (H) 01/06/2019 0922   CALCIUM 9.8 01/06/2019 0922   PROT 8.3 (H) 01/06/2019 0922   ALBUMIN 3.5 01/06/2019 0922   AST 17 01/06/2019 0922   ALT 10 01/06/2019 0922   ALKPHOS 107 01/06/2019 0922   BILITOT 0.3 01/06/2019 0922   GFRNONAA 47 (L) 01/06/2019 0922   GFRAA 54 (L) 01/06/2019 0922   Lab Results  Component Value Date   CHOL 136 08/17/2018   HDL 47.50 08/17/2018   LDLCALC 68 08/17/2018   LDLDIRECT 85.0 06/10/2017   TRIG 101.0 08/17/2018   CHOLHDL 3 08/17/2018   Lab Results  Component Value Date   HGBA1C 7.9 (H) 10/14/2018   No  results found for: VITAMINB12 Lab Results  Component Value Date   TSH 0.71  08/17/2018      ASSESSMENT AND PLAN 68 y.o. year old female  has a past medical history of Anemia, Anxiety, Bipolar 1 disorder ([place]), Cataracts, bilateral, Depression, Diabetes mellitus without complication ([place]), Glaucoma, History of blood transfusion, History of hyperbaric oxygen therapy, Hypercholesteremia, Hypertension, Mediastinal adenopathy, Peripheral neuropathy, Pneumonia, Stroke ([place]) (02/2015), and Wears glasses. here with ***   I spent 15 minutes with the patient. 50% of this time was spent   Butler Denmark, [place], DNP 01/13/2019, 7:34 AM [facility] [address], [place], [place] [address] [phone]

## 2019-01-06 ENCOUNTER — Inpatient Hospital Stay: Payer: Medicare Other

## 2019-01-06 ENCOUNTER — Encounter: Payer: Self-pay | Admitting: *Deleted

## 2019-01-06 ENCOUNTER — Other Ambulatory Visit: Payer: Self-pay

## 2019-01-06 ENCOUNTER — Inpatient Hospital Stay: Payer: Medicare Other | Attending: Internal Medicine | Admitting: Internal Medicine

## 2019-01-06 ENCOUNTER — Encounter: Payer: Self-pay | Admitting: Internal Medicine

## 2019-01-06 VITALS — BP 190/101 | HR 72 | Temp 98.2°F | Resp 17 | Ht 65.0 in | Wt 157.2 lb

## 2019-01-06 DIAGNOSIS — C3411 Malignant neoplasm of upper lobe, right bronchus or lung: Secondary | ICD-10-CM

## 2019-01-06 DIAGNOSIS — C3412 Malignant neoplasm of upper lobe, left bronchus or lung: Secondary | ICD-10-CM | POA: Diagnosis not present

## 2019-01-06 DIAGNOSIS — D649 Anemia, unspecified: Secondary | ICD-10-CM | POA: Diagnosis not present

## 2019-01-06 DIAGNOSIS — Z8673 Personal history of transient ischemic attack (TIA), and cerebral infarction without residual deficits: Secondary | ICD-10-CM

## 2019-01-06 DIAGNOSIS — Z794 Long term (current) use of insulin: Secondary | ICD-10-CM | POA: Diagnosis not present

## 2019-01-06 DIAGNOSIS — Z5111 Encounter for antineoplastic chemotherapy: Secondary | ICD-10-CM | POA: Insufficient documentation

## 2019-01-06 DIAGNOSIS — Z79899 Other long term (current) drug therapy: Secondary | ICD-10-CM | POA: Insufficient documentation

## 2019-01-06 DIAGNOSIS — E119 Type 2 diabetes mellitus without complications: Secondary | ICD-10-CM | POA: Diagnosis not present

## 2019-01-06 DIAGNOSIS — Z800 Family history of malignant neoplasm of digestive organs: Secondary | ICD-10-CM | POA: Diagnosis not present

## 2019-01-06 DIAGNOSIS — Z801 Family history of malignant neoplasm of trachea, bronchus and lung: Secondary | ICD-10-CM | POA: Diagnosis not present

## 2019-01-06 DIAGNOSIS — R918 Other nonspecific abnormal finding of lung field: Secondary | ICD-10-CM

## 2019-01-06 DIAGNOSIS — F17200 Nicotine dependence, unspecified, uncomplicated: Secondary | ICD-10-CM

## 2019-01-06 DIAGNOSIS — I10 Essential (primary) hypertension: Secondary | ICD-10-CM | POA: Diagnosis not present

## 2019-01-06 DIAGNOSIS — Z7189 Other specified counseling: Secondary | ICD-10-CM | POA: Insufficient documentation

## 2019-01-06 DIAGNOSIS — Z87891 Personal history of nicotine dependence: Secondary | ICD-10-CM | POA: Insufficient documentation

## 2019-01-06 DIAGNOSIS — Z7982 Long term (current) use of aspirin: Secondary | ICD-10-CM | POA: Diagnosis not present

## 2019-01-06 LAB — CBC WITH DIFFERENTIAL (CANCER CENTER ONLY)
Abs Immature Granulocytes: 0.03 10*3/uL (ref 0.00–0.07)
Basophils Absolute: 0.1 10*3/uL (ref 0.0–0.1)
Basophils Relative: 1 %
Eosinophils Absolute: 0.1 10*3/uL (ref 0.0–0.5)
Eosinophils Relative: 2 %
HCT: 38.6 % (ref 36.0–46.0)
Hemoglobin: 11.5 g/dL — ABNORMAL LOW (ref 12.0–15.0)
Immature Granulocytes: 0 %
Lymphocytes Relative: 23 %
Lymphs Abs: 1.8 10*3/uL (ref 0.7–4.0)
MCH: 24.5 pg — ABNORMAL LOW (ref 26.0–34.0)
MCHC: 29.8 g/dL — ABNORMAL LOW (ref 30.0–36.0)
MCV: 82.1 fL (ref 80.0–100.0)
MONO ABS: 0.3 10*3/uL (ref 0.1–1.0)
Monocytes Relative: 4 %
Neutro Abs: 5.5 10*3/uL (ref 1.7–7.7)
Neutrophils Relative %: 70 %
Platelet Count: 470 10*3/uL — ABNORMAL HIGH (ref 150–400)
RBC: 4.7 MIL/uL (ref 3.87–5.11)
RDW: 16.6 % — ABNORMAL HIGH (ref 11.5–15.5)
WBC: 7.8 10*3/uL (ref 4.0–10.5)
nRBC: 0 % (ref 0.0–0.2)

## 2019-01-06 LAB — CMP (CANCER CENTER ONLY)
ALBUMIN: 3.5 g/dL (ref 3.5–5.0)
ALT: 10 U/L (ref 0–44)
AST: 17 U/L (ref 15–41)
Alkaline Phosphatase: 107 U/L (ref 38–126)
Anion gap: 10 (ref 5–15)
BILIRUBIN TOTAL: 0.3 mg/dL (ref 0.3–1.2)
BUN: 13 mg/dL (ref 8–23)
CO2: 26 mmol/L (ref 22–32)
Calcium: 9.8 mg/dL (ref 8.9–10.3)
Chloride: 106 mmol/L (ref 98–111)
Creatinine: 1.21 mg/dL — ABNORMAL HIGH (ref 0.44–1.00)
GFR, Est AFR Am: 54 mL/min — ABNORMAL LOW (ref >60)
GFR, Estimated: 47 mL/min — ABNORMAL LOW (ref >60)
Glucose, Bld: 93 mg/dL (ref 70–99)
Potassium: 4 mmol/L (ref 3.5–5.1)
Sodium: 142 mmol/L (ref 135–145)
Total Protein: 8.3 g/dL — ABNORMAL HIGH (ref 6.5–8.1)

## 2019-01-06 MED ORDER — PROCHLORPERAZINE MALEATE 10 MG PO TABS: 10.0000 mg | ORAL_TABLET | Freq: Four times a day (QID) | ORAL | 0 refills | Status: DC | PRN

## 2019-01-06 NOTE — Progress Notes (Signed)
Oncology Nurse Navigator Documentation  Oncology Nurse Navigator Flowsheets 01/06/2019  Navigator Location [facility]  Navigator Encounter Type Clinic/MDC/I spoke with patient and daughter today.  Patient is newly DX small cell lung cancer.  I gave and explained information on lung cancer, treatment plan, and next steps.   Patient has a HX of anxiety and depression.  Daughter would like to speak to a CSW about patient's anxiety.  She was give [place] contact information.  I will reach out to [facility] with an update.  Patient needs to see Rad Onc.  I will update that team on referral and need for appt.  Daughter states she is very involved in patients care but is looking at placement for her mom at this time.    Abnormal Finding Date 10/16/2018  Confirmed Diagnosis Date 12/28/2018  Patient Visit Type MedOnc  Treatment Phase Pre-Tx/Tx Discussion  Barriers/Navigation Needs Education;Coordination of Care  Education Understanding Cancer/ Treatment Options;Newly Diagnosed Cancer Education;Other  Interventions Coordination of Care;Education;Other  Coordination of Care Other  Education Method Verbal;Written  Support Groups/Services Other  Acuity Level 2  Time Spent with Patient 71

## 2019-01-06 NOTE — Progress Notes (Signed)
START ON PATHWAY REGIMEN - Small Cell Lung     A cycle is every 21 days:     Etoposide      Carboplatin   **Always confirm dose/schedule in your pharmacy ordering system**  Patient Characteristics: Newly Diagnosed, Preoperative or Nonsurgical Candidate (Clinical Staging), First Line, Limited Stage, Nonsurgical Candidate Therapeutic Status: Newly Diagnosed, Preoperative or Nonsurgical Candidate (Clinical Staging) AJCC T Category: cT2 AJCC N Category: cN2 AJCC M Category: cM0 AJCC 8 Stage Grouping: IIIA Stage Classification: Limited Surgical Candidacy: Nonsurgical Candidate Intent of Therapy: Curative Intent, Discussed with Patient

## 2019-01-06 NOTE — Progress Notes (Signed)
[name] Telephone:([phone]) [phone]   [phone]) [phone]  CONSULT NOTE  REFERRING PHYSICIAN: Dr. Leory Plowman Icard  REASON FOR CONSULTATION:  68 years old African-American female recently diagnosed with lung cancer.  HPI Tara Hopkins is a 68 y.o. female with past medical history significant for multiple medical problems including hypertension, diabetes mellitus, peripheral neuropathy, depression, bilateral cataract, anxiety, anemia, dyslipidemia, several strokes since 2016 as well as long history of smoking.  The patient has a fall in December 2019 and she was seen at the emergency department for evaluation.  During her evaluation she had chest x-ray performed on October 16, 2018 and that showed prominent right paratracheal density.  This was followed by CT scan of the chest on October 28, 2018 and that showed bulky necrotic mediastinal lymphadenopathy including index right paratracheal lymph node measuring 2.3 cm in short axis, subcarinal 3.1 cm.  There was also 1.0 cm posterior right upper lobe nodule identified with adjacent tiny satellite nodules and 0.3 cm posterior left upper lobe nodule.  A PET scan was performed on December 01, 2018 and that showed mediastinal lymphadenopathy markedly hypermetabolic.  No evidence for hypermetabolic lymphadenopathy in the axillary region or abdomen/pelvis.  There was also hypermetabolic FDG accumulation in the tonsillar regions bilaterally and posterior left nasopharynx/oropharynx that was indeterminate.  The scan also showed 1.0 cm right upper lobe hypermetabolic nodule. The patient was referred to Dr. [name] and on December 22, 2018 she underwent bronchoscopy with endobronchial ultrasound and biopsies.  The final cytology ([id number]) was consistent with a small cell carcinoma. Immunostains for synaptophysin, TTF1, CD56 and AE1/AE3 are positive in the tumor cells. Immunostain for Napsin A is negative. This immunoprofile is consistent with the  above diagnosis. Ki-67 shows a proliferative index of about 50-60%.  The patient also had MRI of the brain performed on December 28, 2018 that showed no evidence of metastatic disease to the brain. The patient was referred to me today for evaluation and recommendation regarding her recent diagnosis of the lung cancer.  When seen today she is feeling fine except for the neuropathy and the feet and fingers.  She denied having any current chest pain, shortness of breath but continues to have dry cough with no hemoptysis.  She lost around 10 pounds in the last few months.  She had cataract surgery recently.  The patient denied having any nausea, vomiting, diarrhea or constipation. Family history significant for mother with lung cancer, father had hypertension and brother had heart disease. The patient is single and has 3 children.  She was accompanied today by her daughter Tara Hopkins.  The patient used to work in [employer] for [place].  She has a history of smoking 1 pack/day for over 50 years and quit 1 week ago.  She has no history of alcohol abuse but she has a history of drug abuse including cocaine.   HPI  Past Medical History:  Diagnosis Date  . Anemia   . Anxiety   . Bipolar 1 disorder ([place])   . Cataracts, bilateral   . Depression   . Diabetes mellitus without complication ([place])   . Glaucoma   . History of blood transfusion   . History of hyperbaric oxygen therapy    pt reports 80 treatments.  . Hypercholesteremia   . Hypertension   . Mediastinal adenopathy   . Peripheral neuropathy   . Pneumonia   . Stroke ([place]) 02/2015  . Wears glasses     Past  Surgical History:  Procedure Laterality Date  . AMPUTATION Right 12/15/2013   Procedure: Right Transmetatarsal Amputation;  Surgeon: Newt Minion, MD;  Location: [facility];  Service: Orthopedics;  Laterality: Right;  . AMPUTATION Right 02/23/2014   Procedure: AMPUTATION FOOT;  Surgeon: Newt Minion, MD;  Location: [facility];   Service: Orthopedics;  Laterality: Right;  Right Foot Revision Transmetatarsal Amputation  . CATARACT EXTRACTION W/ INTRAOCULAR LENS  IMPLANT, BILATERAL    . COLONOSCOPY W/ BIOPSIES AND POLYPECTOMY    . I&D EXTREMITY Right 05/23/2013   Procedure: IRRIGATION AND DEBRIDEMENT RIGHT GREAT TOE;  Surgeon: Mauri Pole, MD;  Location: [facility];  Service: Orthopedics;  Laterality: Right;  . TUBAL LIGATION    . VIDEO BRONCHOSCOPY WITH ENDOBRONCHIAL ULTRASOUND N/A 12/22/2018   Procedure: VIDEO BRONCHOSCOPY WITH ENDOBRONCHIAL ULTRASOUND;  Surgeon: [name], DO;  Location: [facility];  Service: Thoracic;  Laterality: N/A;    Family History  Problem Relation Age of Onset  . Liver cancer Mother   . Lung cancer Mother        smoked  . Depression Mother   . Hypertension Father   . Depression Father   . Colon cancer Neg Hx     Social History Social History   Tobacco Use  . Smoking status: Former Smoker    Packs/day: 0.50    Years: 40.00    Pack years: 20.00    Types: Cigarettes    Last attempt to quit: 10/17/2018    Years since quitting: 0.2  . Smokeless tobacco: Never Used  Substance Use Topics  . Alcohol use: Not Currently    Alcohol/week: 0.0 standard drinks  . Drug use: Yes    Types: Cocaine, Marijuana    Comment: last use of cocaine last use 12/09/2018    No Known Allergies  Current Outpatient Medications  Medication Sig Dispense Refill  . aspirin 325 MG EC tablet Take 1 tablet (325 mg total) by mouth daily. 30 tablet 0  . aspirin EC 81 MG tablet Take 81 mg by mouth daily.    Marland Kitchen atorvastatin (LIPITOR) 40 MG tablet Take 1 tablet (40 mg total) by mouth daily at 6 PM. 90 tablet 0  . citalopram (CELEXA) 10 MG tablet Take 1 tablet (10 mg total) by mouth daily. 90 tablet 1  . clonazePAM (KLONOPIN) 0.5 MG tablet Take 1 tablet (0.5 mg total) by mouth 3 (three) times daily. 90 tablet 1  . gabapentin (NEURONTIN) 400 MG capsule TAKE 3 CAPSULES BY MOUTH THREE TIMES A DAY (Patient taking  differently: Take 1,200 mg by mouth 3 (three) times daily. TAKE 3 CAPSULES BY MOUTH THREE TIMES A DAY) 270 capsule 2  . glimepiride (AMARYL) 4 MG tablet TAKE 1 TABLET BY MOUTH EVERY DAY( NEEDS YEARLY PHYSICAL WITH CORY) (Patient taking differently: Take 4 mg by mouth daily with breakfast. ) 90 tablet 0  . glucose blood (ONETOUCH VERIO) test strip USE TO TEST BLOOD GLUCOSE 3 TIMES DAILY 100 each 12  . Insulin Glargine (LANTUS SOLOSTAR) 100 UNIT/ML Solostar Pen Inject 25 Units into the skin at bedtime. (Patient taking differently: Inject 40 Units into the skin at bedtime. ) 15 mL 6  . Insulin Pen Needle (B-D UF III MINI PEN NEEDLES) 31G X 5 MM MISC Use as directed. 100 each 3  . latanoprost (XALATAN) 0.005 % ophthalmic solution INSTILL 1 DROP INTO BOTH EYES EVERY EVENING (Patient taking differently: Place 1 drop into both eyes at bedtime. ) 7.5 mL 0  .  lisinopril (PRINIVIL,ZESTRIL) 40 MG tablet Take 1 tablet (40 mg total) by mouth daily. 90 tablet 1  . ONE TOUCH LANCETS MISC USE TO CHECK BLOOD SUGAR 3 TIMES DAILY 200 each 3  . traMADol (ULTRAM) 50 MG tablet Take 1 tablet (50 mg total) by mouth every 12 (twelve) hours as needed. 30 tablet 0   No current facility-administered medications for this visit.     Review of Systems  Constitutional: positive for fatigue and weight loss Eyes: negative Ears, nose, mouth, throat, and face: negative Respiratory: positive for cough Cardiovascular: negative Gastrointestinal: negative Genitourinary:negative Integument/breast: negative Hematologic/lymphatic: negative Musculoskeletal:positive for muscle weakness Neurological: negative Behavioral/Psych: negative Endocrine: negative Allergic/Immunologic: negative  Physical Exam  WLS:LHTDS, healthy, no distress, well nourished, well developed and anxious SKIN: skin color, texture, turgor are normal, no rashes or significant lesions HEAD: Normocephalic, No masses, lesions, tenderness or abnormalities EYES:  normal, PERRLA, Conjunctiva are pink and non-injected EARS: External ears normal, Canals clear OROPHARYNX:no exudate, no erythema and lips, buccal mucosa, and tongue normal  NECK: supple, no adenopathy, no JVD LYMPH:  no palpable lymphadenopathy, no hepatosplenomegaly BREAST:not examined LUNGS: clear to auscultation , and palpation HEART: regular rate & rhythm, no murmurs and no gallops ABDOMEN:abdomen soft, non-tender, normal bowel sounds and no masses or organomegaly BACK: Back symmetric, no curvature., No CVA tenderness EXTREMITIES:no joint deformities, effusion, or inflammation, no edema  NEURO: alert & oriented x 3 with fluent speech, no focal motor/sensory deficits  PERFORMANCE STATUS: ECOG 1-2  LABORATORY DATA: Lab Results  Component Value Date   WBC 7.8 01/06/2019   HGB 11.5 (L) 01/06/2019   HCT 38.6 01/06/2019   MCV 82.1 01/06/2019   PLT 470 (H) 01/06/2019      Chemistry      Component Value Date/Time   NA 143 12/22/2018 1108   K 3.7 12/22/2018 1108   CL 109 12/22/2018 1108   CO2 23 12/22/2018 1108   BUN 8 12/22/2018 1108   CREATININE 1.27 (H) 12/22/2018 1108      Component Value Date/Time   CALCIUM 8.7 (L) 12/22/2018 1108   ALKPHOS 79 12/22/2018 1108   AST 17 12/22/2018 1108   ALT 12 12/22/2018 1108   BILITOT 0.3 12/22/2018 1108       RADIOGRAPHIC STUDIES: Mr Jeri Cos KA Contrast  Result Date: 12/28/2018 CLINICAL DATA:  Seizures for 3 months. Difficulty ambulating. Slurred speech. Small cell lung cancer. EXAM: MRI HEAD WITHOUT AND WITH CONTRAST TECHNIQUE: Multiplanar, multiecho pulse sequences of the brain and surrounding structures were obtained without and with intravenous contrast. CONTRAST:  Gadavist 7 mL. COMPARISON:  PET scan 12/01/2018 FINDINGS: The patient was unable to remain motionless for the exam. Small or subtle lesions could be overlooked. Brain: No evidence for acute infarction, hemorrhage, mass lesion, hydrocephalus, or extra-axial fluid.  Generalized atrophy. Extensive T2 and FLAIR hyperintensities throughout the white matter, also involving the brainstem, scattered chronic lacunar infarcts, consistent with advanced small vessel disease. Post infusion images are motion degraded. No definite metastatic disease one could not exclude metastases with a high degree of certainty because of the patient motion. Vascular: Normal flow voids. Skull and upper cervical spine: Unremarkable skull base. Pannus. Cervical spondylosis. Sinuses/Orbits: No significant sinus or mastoid fluid. BILATERAL cataract extraction. Other: Trace mastoid effusions, greater on the LEFT. IMPRESSION: Generalized atrophy.  Advanced small vessel disease. No focal intracranial abnormality contributing to seizures is observed. Motion degraded exam, particularly on post infusion images, demonstrates no definite metastatic disease, see discussion above. Electronically Signed  By: Staci Righter M.D.   On: 12/28/2018 12:03    ASSESSMENT: This is a very pleasant 68 years old African-American female recently diagnosed with limited stage (T1c, N2, M0) small cell lung cancer presented with left upper lobe lung nodule in addition to mediastinal lymphadenopathy diagnosed in February 2020.   PLAN: I had a lengthy discussion with the patient and her daughter today about her current disease stage, prognosis and treatment options. I personally and independently reviewed the scan images and discussed the result and showed the images with the patient and her daughter today.  The patient has a potentially curable condition. I recommended for the patient to consider a course of systemic chemotherapy with carboplatin for AUC of 5 on day 1 and etoposide at 100 mg/M2 on days 1, 2 and 3 with Neulasta support every 3 weeks.  This will be concurrent with radiation. I discussed with the patient and her daughter the adverse effect of this treatment including but not limited to alopecia, myelosuppression,  nausea and vomiting, peripheral neuropathy, liver or renal dysfunction. She is expected to start the first cycle of this treatment on January 16, 2019. I will arrange for the patient to have a chemotherapy education class before the first dose of her treatment. I will refer the patient to radiation oncology for evaluation and discussion of the radiotherapy option. I will call her pharmacy with prescription for Compazine 10 mg p.o. every 6 hours as needed for nausea. The patient will come back for follow-up visit 1 week after her first cycle of the treatment for evaluation and management of any adverse effect of her treatment. For the other medical condition, the patient will continue her current treatment under the care of her primary care physician. The patient lives by herself and will refer her to the social worker at the cancer center for evaluation and recommendation regarding her social status. For smoking cessation, I strongly encouraged the patient to continue quitting and offered her assistance if needed. The patient was advised to call immediately if she has any concerning symptoms in the interval. The patient voices understanding of current disease status and treatment options and is in agreement with the current care plan.  All questions were answered. The patient knows to call the clinic with any problems, questions or concerns. We can certainly see the patient much sooner if necessary.  Thank you so much for allowing me to participate in the care of [facility]. I will continue to follow up the patient with you and assist in her care.  I spent 55 minutes counseling the patient face to face. The total time spent in the appointment was 80 minutes.  Disclaimer: This note was dictated with voice recognition software. Similar sounding words can inadvertently be transcribed and may not be corrected upon review.   Eilleen Kempf January 06, 2019, 9:45 AM

## 2019-01-09 ENCOUNTER — Encounter: Payer: Self-pay | Admitting: *Deleted

## 2019-01-09 NOTE — Progress Notes (Signed)
Oncology Nurse Navigator Documentation  Oncology Nurse Navigator Flowsheets 01/09/2019  Navigator Location [facility]  Navigator Encounter Type Other/I followed up on Tara Hopkins's appt to see Rad Onc.  I did not see an appt yet so I reached out to the clerical staff to help expedite.   Treatment Phase Pre-Tx/Tx Discussion  Barriers/Navigation Needs Coordination of Care  Interventions Coordination of Care  Coordination of Care Other  Acuity Level 1  Time Spent with Patient 15

## 2019-01-11 ENCOUNTER — Ambulatory Visit: Payer: Self-pay | Admitting: Pulmonary Disease

## 2019-01-11 ENCOUNTER — Telehealth: Payer: Self-pay | Admitting: *Deleted

## 2019-01-11 NOTE — Telephone Encounter (Signed)
Today I spoke with Dr. Sondra Come and he is willing to see this patient on Friday March 6th @ 10:30 - 11:00 for a consult. I called the patient and left a voicemail and I attempted to call her daughter which the mailbox is full.

## 2019-01-12 ENCOUNTER — Telehealth: Payer: Self-pay | Admitting: *Deleted

## 2019-01-12 ENCOUNTER — Inpatient Hospital Stay: Payer: HMO

## 2019-01-12 NOTE — Telephone Encounter (Signed)
Oncology Nurse Navigator Documentation  Oncology Nurse Navigator Flowsheets 01/12/2019  Navigator Location [facility]  Navigator Encounter Type Telephone/I called patient's daughter to follow up with her due to patient missing her appt with chemo ed today.  I was unable to reach her or leave a vm message.   Telephone Outgoing Call  Treatment Phase Pre-Tx/Tx Discussion  Acuity Level 1  Time Spent with Patient 15

## 2019-01-13 ENCOUNTER — Ambulatory Visit: Payer: Medicare Other | Admitting: Neurology

## 2019-01-13 ENCOUNTER — Telehealth: Payer: Self-pay

## 2019-01-13 ENCOUNTER — Ambulatory Visit: Payer: HMO

## 2019-01-13 ENCOUNTER — Ambulatory Visit
Admission: RE | Admit: 2019-01-13 | Discharge: 2019-01-13 | Disposition: A | Discharge status: Home / Self Care | Payer: HMO | Source: Ambulatory Visit | Attending: Radiation Oncology | Admitting: Radiation Oncology

## 2019-01-13 ENCOUNTER — Inpatient Hospital Stay: Payer: HMO | Attending: Internal Medicine

## 2019-01-13 DIAGNOSIS — R590 Localized enlarged lymph nodes: Secondary | ICD-10-CM | POA: Insufficient documentation

## 2019-01-13 DIAGNOSIS — F419 Anxiety disorder, unspecified: Secondary | ICD-10-CM | POA: Insufficient documentation

## 2019-01-13 DIAGNOSIS — Z89431 Acquired absence of right foot: Secondary | ICD-10-CM | POA: Insufficient documentation

## 2019-01-13 DIAGNOSIS — I10 Essential (primary) hypertension: Secondary | ICD-10-CM | POA: Insufficient documentation

## 2019-01-13 DIAGNOSIS — Z5111 Encounter for antineoplastic chemotherapy: Secondary | ICD-10-CM | POA: Insufficient documentation

## 2019-01-13 DIAGNOSIS — F319 Bipolar disorder, unspecified: Secondary | ICD-10-CM | POA: Insufficient documentation

## 2019-01-13 DIAGNOSIS — Z818 Family history of other mental and behavioral disorders: Secondary | ICD-10-CM | POA: Insufficient documentation

## 2019-01-13 DIAGNOSIS — I69354 Hemiplegia and hemiparesis following cerebral infarction affecting left non-dominant side: Secondary | ICD-10-CM | POA: Insufficient documentation

## 2019-01-13 DIAGNOSIS — E1142 Type 2 diabetes mellitus with diabetic polyneuropathy: Secondary | ICD-10-CM | POA: Insufficient documentation

## 2019-01-13 DIAGNOSIS — Z7982 Long term (current) use of aspirin: Secondary | ICD-10-CM | POA: Insufficient documentation

## 2019-01-13 DIAGNOSIS — E7800 Pure hypercholesterolemia, unspecified: Secondary | ICD-10-CM | POA: Insufficient documentation

## 2019-01-13 DIAGNOSIS — Z8249 Family history of ischemic heart disease and other diseases of the circulatory system: Secondary | ICD-10-CM | POA: Insufficient documentation

## 2019-01-13 DIAGNOSIS — C3412 Malignant neoplasm of upper lobe, left bronchus or lung: Secondary | ICD-10-CM | POA: Insufficient documentation

## 2019-01-13 DIAGNOSIS — Z87891 Personal history of nicotine dependence: Secondary | ICD-10-CM | POA: Insufficient documentation

## 2019-01-13 DIAGNOSIS — E1140 Type 2 diabetes mellitus with diabetic neuropathy, unspecified: Secondary | ICD-10-CM | POA: Insufficient documentation

## 2019-01-13 DIAGNOSIS — Z8673 Personal history of transient ischemic attack (TIA), and cerebral infarction without residual deficits: Secondary | ICD-10-CM | POA: Insufficient documentation

## 2019-01-13 DIAGNOSIS — Z800 Family history of malignant neoplasm of digestive organs: Secondary | ICD-10-CM | POA: Insufficient documentation

## 2019-01-13 DIAGNOSIS — Z794 Long term (current) use of insulin: Secondary | ICD-10-CM | POA: Insufficient documentation

## 2019-01-13 DIAGNOSIS — Z5189 Encounter for other specified aftercare: Secondary | ICD-10-CM | POA: Insufficient documentation

## 2019-01-13 DIAGNOSIS — Z801 Family history of malignant neoplasm of trachea, bronchus and lung: Secondary | ICD-10-CM | POA: Insufficient documentation

## 2019-01-13 DIAGNOSIS — Z79899 Other long term (current) drug therapy: Secondary | ICD-10-CM | POA: Insufficient documentation

## 2019-01-13 DIAGNOSIS — C3411 Malignant neoplasm of upper lobe, right bronchus or lung: Secondary | ICD-10-CM | POA: Insufficient documentation

## 2019-01-13 DIAGNOSIS — H409 Unspecified glaucoma: Secondary | ICD-10-CM | POA: Insufficient documentation

## 2019-01-13 NOTE — Telephone Encounter (Signed)
Patient was a no call/no show for their appointment today.   

## 2019-01-16 ENCOUNTER — Telehealth: Payer: Self-pay | Admitting: *Deleted

## 2019-01-16 ENCOUNTER — Telehealth: Payer: Self-pay | Admitting: Internal Medicine

## 2019-01-16 ENCOUNTER — Telehealth: Payer: Self-pay

## 2019-01-16 ENCOUNTER — Inpatient Hospital Stay: Payer: HMO

## 2019-01-16 ENCOUNTER — Encounter: Payer: Self-pay | Admitting: Neurology
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
## 2019-01-16 NOTE — Telephone Encounter (Signed)
[place] Work  Clinical Social Work received message from medical oncology RN, patient needing transportation to this week's chemotherapy appointment.  CSW unable to reach patient, left VM to contact CSW as soon as possible.  Gwinda Maine, LCSW  Clinical Social Worker [facility]

## 2019-01-16 NOTE — Telephone Encounter (Signed)
Missed appt. Transportation issue. Asking for new appts.LVM with Jake Seats to return call for appt.

## 2019-01-16 NOTE — Telephone Encounter (Signed)
Scheduled appt per 3/09 sch message . Unable to reach pt . Left message with appt date and time

## 2019-01-16 NOTE — Telephone Encounter (Signed)
Called patient concerning her appointment for this morning but no response. Left her a voice message.

## 2019-01-17 ENCOUNTER — Inpatient Hospital Stay: Payer: HMO

## 2019-01-17 ENCOUNTER — Telehealth: Payer: Self-pay | Admitting: *Deleted

## 2019-01-17 ENCOUNTER — Other Ambulatory Visit: Payer: Self-pay | Admitting: Adult Health

## 2019-01-17 DIAGNOSIS — Z760 Encounter for issue of repeat prescription: Secondary | ICD-10-CM

## 2019-01-17 NOTE — Telephone Encounter (Signed)
Oncology Nurse Navigator Documentation  Oncology Nurse Navigator Flowsheets 01/17/2019  Navigator Location [facility]  Navigator Encounter Type Telephone/I called to check on Ms. Cerami.  She has missed several appts.  I was able to get speak with her and she states she has been waiting for our call.  I updated she has missed several appts with [place].  She has difficulty with transportation. At that time we were disconnected.  I called back but was unable to speak with her.  I did leave a vm message with my name and phone number.  I will also updated transportation to help set her up with rides.    Telephone Outgoing Call  Treatment Phase Pre-Tx/Tx Discussion  Barriers/Navigation Needs Education;Coordination of Care  Education Other  Interventions Coordination of Care;Education  Coordination of Care Other  Acuity Level 2  Time Spent with Patient 30

## 2019-01-17 NOTE — Telephone Encounter (Signed)
[place] Work  Clinical Social Work contacted patient and discussed importance of attending chemo appointments.  Patient agreed to utilize [facility] transportation services (patient is already enrolled).  CSW contacted [facility] to request rides and notified patient. [facility] will pick up patient between 7:07-7:37 and will pick up patient at [facility] to bring home between 2:06-2:36.   Gwinda Maine, LCSW  Clinical Social Worker [facility]

## 2019-01-17 NOTE — Progress Notes (Signed)
Nutrition  Patient identified on Malnutrition Screening tool for weight loss.  RD planning to see patient during infusion today but patient was a no show for infusion. Will plan to see patient on 3/31 during next infusion  Kent Braunschweig B. Zenia Resides, [place], [place] Registered Dietitian [phone] (pager)

## 2019-01-18 ENCOUNTER — Telehealth: Payer: Self-pay | Admitting: *Deleted

## 2019-01-18 ENCOUNTER — Encounter: Payer: Self-pay | Admitting: Internal Medicine

## 2019-01-18 ENCOUNTER — Inpatient Hospital Stay: Payer: HMO

## 2019-01-18 ENCOUNTER — Ambulatory Visit: Payer: HMO | Admitting: Nutrition

## 2019-01-18 ENCOUNTER — Other Ambulatory Visit: Payer: Self-pay

## 2019-01-18 DIAGNOSIS — E1140 Type 2 diabetes mellitus with diabetic neuropathy, unspecified: Secondary | ICD-10-CM | POA: Diagnosis not present

## 2019-01-18 DIAGNOSIS — Z79899 Other long term (current) drug therapy: Secondary | ICD-10-CM | POA: Diagnosis not present

## 2019-01-18 DIAGNOSIS — I69354 Hemiplegia and hemiparesis following cerebral infarction affecting left non-dominant side: Secondary | ICD-10-CM | POA: Diagnosis not present

## 2019-01-18 DIAGNOSIS — C3411 Malignant neoplasm of upper lobe, right bronchus or lung: Secondary | ICD-10-CM

## 2019-01-18 DIAGNOSIS — C3412 Malignant neoplasm of upper lobe, left bronchus or lung: Secondary | ICD-10-CM | POA: Diagnosis not present

## 2019-01-18 DIAGNOSIS — Z5189 Encounter for other specified aftercare: Secondary | ICD-10-CM | POA: Diagnosis not present

## 2019-01-18 DIAGNOSIS — Z7982 Long term (current) use of aspirin: Secondary | ICD-10-CM | POA: Diagnosis not present

## 2019-01-18 DIAGNOSIS — F419 Anxiety disorder, unspecified: Secondary | ICD-10-CM | POA: Diagnosis not present

## 2019-01-18 DIAGNOSIS — E7800 Pure hypercholesterolemia, unspecified: Secondary | ICD-10-CM | POA: Diagnosis not present

## 2019-01-18 DIAGNOSIS — I10 Essential (primary) hypertension: Secondary | ICD-10-CM | POA: Diagnosis not present

## 2019-01-18 DIAGNOSIS — Z794 Long term (current) use of insulin: Secondary | ICD-10-CM | POA: Diagnosis not present

## 2019-01-18 DIAGNOSIS — Z5111 Encounter for antineoplastic chemotherapy: Secondary | ICD-10-CM | POA: Diagnosis not present

## 2019-01-18 DIAGNOSIS — F319 Bipolar disorder, unspecified: Secondary | ICD-10-CM | POA: Diagnosis not present

## 2019-01-18 LAB — CMP (CANCER CENTER ONLY)
ALT: 8 U/L (ref 0–44)
AST: 11 U/L — ABNORMAL LOW (ref 15–41)
Albumin: 2.9 g/dL — ABNORMAL LOW (ref 3.5–5.0)
Alkaline Phosphatase: 102 U/L (ref 38–126)
Anion gap: 12 (ref 5–15)
BILIRUBIN TOTAL: 0.2 mg/dL — AB (ref 0.3–1.2)
BUN: 27 mg/dL — ABNORMAL HIGH (ref 8–23)
CALCIUM: 8.9 mg/dL (ref 8.9–10.3)
CO2: 26 mmol/L (ref 22–32)
Chloride: 102 mmol/L (ref 98–111)
Creatinine: 1.65 mg/dL — ABNORMAL HIGH (ref 0.44–1.00)
GFR, Est AFR Am: 37 mL/min — ABNORMAL LOW (ref >60)
GFR, Estimated: 32 mL/min — ABNORMAL LOW (ref >60)
Glucose, Bld: 205 mg/dL — ABNORMAL HIGH (ref 70–99)
Potassium: 3.7 mmol/L (ref 3.5–5.1)
Sodium: 140 mmol/L (ref 135–145)
Total Protein: 7.6 g/dL (ref 6.5–8.1)

## 2019-01-18 LAB — CBC WITH DIFFERENTIAL (CANCER CENTER ONLY)
Abs Immature Granulocytes: 0.03 10*3/uL (ref 0.00–0.07)
Basophils Absolute: 0 10*3/uL (ref 0.0–0.1)
Basophils Relative: 0 %
Eosinophils Absolute: 0.1 10*3/uL (ref 0.0–0.5)
Eosinophils Relative: 2 %
HEMATOCRIT: 36 % (ref 36.0–46.0)
HEMOGLOBIN: 10.9 g/dL — AB (ref 12.0–15.0)
Immature Granulocytes: 0 %
LYMPHS PCT: 28 %
Lymphs Abs: 2.3 10*3/uL (ref 0.7–4.0)
MCH: 25.4 pg — ABNORMAL LOW (ref 26.0–34.0)
MCHC: 30.3 g/dL (ref 30.0–36.0)
MCV: 83.9 fL (ref 80.0–100.0)
Monocytes Absolute: 0.3 10*3/uL (ref 0.1–1.0)
Monocytes Relative: 4 %
Neutro Abs: 5.4 10*3/uL (ref 1.7–7.7)
Neutrophils Relative %: 66 %
Platelet Count: 542 10*3/uL — ABNORMAL HIGH (ref 150–400)
RBC: 4.29 MIL/uL (ref 3.87–5.11)
RDW: 16.8 % — ABNORMAL HIGH (ref 11.5–15.5)
WBC Count: 8.2 10*3/uL (ref 4.0–10.5)
nRBC: 0 % (ref 0.0–0.2)

## 2019-01-18 MED ORDER — DEXAMETHASONE SODIUM PHOSPHATE 10 MG/ML IJ SOLN
10.0000 mg | Freq: Once | INTRAMUSCULAR | Status: AC
  Administered 2019-01-18: 10 mg via INTRAVENOUS

## 2019-01-18 MED ORDER — PALONOSETRON HCL INJECTION 0.25 MG/5ML
0.2500 mg | Freq: Once | INTRAVENOUS | Status: AC
  Administered 2019-01-18: 0.25 mg via INTRAVENOUS

## 2019-01-18 MED ORDER — PALONOSETRON HCL INJECTION 0.25 MG/5ML
INTRAVENOUS | Status: AC
  Filled 2019-01-18: qty 5

## 2019-01-18 MED ORDER — SODIUM CHLORIDE 0.9 % IV SOLN
313.0000 mg | Freq: Once | INTRAVENOUS | Status: AC
  Administered 2019-01-18: 310 mg via INTRAVENOUS
  Filled 2019-01-18: qty 31

## 2019-01-18 MED ORDER — DEXAMETHASONE SODIUM PHOSPHATE 10 MG/ML IJ SOLN
INTRAMUSCULAR | Status: AC
  Filled 2019-01-18: qty 1

## 2019-01-18 MED ORDER — SODIUM CHLORIDE 0.9 % IV SOLN
100.0000 mg/m2 | Freq: Once | INTRAVENOUS | Status: AC
  Administered 2019-01-18: 180 mg via INTRAVENOUS
  Filled 2019-01-18: qty 9

## 2019-01-18 MED ORDER — SODIUM CHLORIDE 0.9 % IV SOLN
Freq: Once | INTRAVENOUS | Status: AC
  Administered 2019-01-18: 12:00:00 via INTRAVENOUS
  Filled 2019-01-18: qty 250

## 2019-01-18 NOTE — Progress Notes (Signed)
CBC and CMET reviewed by MD, VO ok to treat despite labs.

## 2019-01-18 NOTE — Progress Notes (Signed)
Patient is a 68 year old female diagnosed with small cell lung cancer.  She is a patient of Dr. Julien Nordmann.  Past medical history includes stroke, hypertension, hypercholesterolemia, diabetes, depression, tobacco, bipolar disease, anxiety, anemia, and right foot amputation.  Patient also noted for polysubstance abuse.  Medications include Lipitor, Klonopin, Amaryl, Lantus, and Compazine.  Labs include glucose 205, BUN 27, creatinine 1.65, and albumin 2.9 on March 11.  Height: 65 inches. Weight: 157.2 pounds. Usual body weight: 167 pounds in January 2020 BMI: 26.16.  Patient reports she has a good appetite and tries to eat well. She states she has lost weight recently because she had eye surgery. Reports she lives in an apartment and she cooks and shops for herself. She denies nutrition impact symptoms. She is tearful today due to her diagnosis and her children's reaction to her cancer. Nutrition focused physical exam deferred.  Nutrition diagnosis:  Unintended weight loss related to inadequate oral intake as evidenced by 10 pound weight loss over 2 months.  Intervention: Patient educated to consume regular meals with snacks 2-3 times daily. Educated patient on higher calorie, higher protein foods. Encouraged weight maintenance. Provided samples of Glucerna and boost glucose control and suggested patient may want to snack on these. Questions were answered.  Teach back method used.  Contact information given.  Monitoring, evaluation, goals: Patient will tolerate adequate calories and protein for weight maintenance.  Next visit: Tuesday, March 31 during infusion.  **Disclaimer: This note was dictated with voice recognition software. Similar sounding words can inadvertently be transcribed and this note may contain transcription errors which may not have been corrected upon publication of note.**

## 2019-01-18 NOTE — Telephone Encounter (Signed)
Pt has not shown for education appt today.  Called pt's # & left message to call us back.  Tried daughter's # & unable to leave message b/c mailbox full.  Informed Dr Olene Floss.

## 2019-01-18 NOTE — Telephone Encounter (Signed)
Call to [facility] for p/u at 4pm today. Arranged transportation for pt for 3/12, 3/13 and 3/16 pick up at home and return back from [facility]. Call to Nutrition to advise pt will be here next 3 business days if possible to r/s 3/16 appt as pt transportation is [facility].

## 2019-01-18 NOTE — Patient Instructions (Signed)
[place] Discharge Instructions for Patients Receiving Chemotherapy  Today you received the following chemotherapy agents Carboplatin, VP-16  To help prevent nausea and vomiting after your treatment, we encourage you to take your nausea medication as directed  If you develop nausea and vomiting that is not controlled by your nausea medication, call the clinic.   BELOW ARE SYMPTOMS THAT SHOULD BE REPORTED IMMEDIATELY:  *FEVER GREATER THAN 100.5 F  *CHILLS WITH OR WITHOUT FEVER  NAUSEA AND VOMITING THAT IS NOT CONTROLLED WITH YOUR NAUSEA MEDICATION  *UNUSUAL SHORTNESS OF BREATH  *UNUSUAL BRUISING OR BLEEDING  TENDERNESS IN MOUTH AND THROAT WITH OR WITHOUT PRESENCE OF ULCERS  *URINARY PROBLEMS  *BOWEL PROBLEMS  UNUSUAL RASH Items with * indicate a potential emergency and should be followed up as soon as possible.  Feel free to call the clinic should you have any questions or concerns. The clinic phone number is [phone].  Please show the [place] at check-in to the Emergency Department and triage nurse.

## 2019-01-18 NOTE — Progress Notes (Signed)
Went to infusion to introduce myself as Arboriculturist and to offer available resources.  Discussed one-time $85 Engineer, drilling to assist with personal expenses while going through treatment. Advised what is needed to apply. She verbalized understanding.  Gave her my card for any additional financial questions or concerns.

## 2019-01-19 ENCOUNTER — Inpatient Hospital Stay: Payer: HMO

## 2019-01-19 ENCOUNTER — Telehealth: Payer: Self-pay | Admitting: Adult Health
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
## 2019-01-20 ENCOUNTER — Inpatient Hospital Stay: Payer: HMO

## 2019-01-20 ENCOUNTER — Other Ambulatory Visit: Payer: Self-pay

## 2019-01-20 ENCOUNTER — Ambulatory Visit: Payer: Self-pay

## 2019-01-20 VITALS — BP 173/83 | HR 58 | Temp 97.9°F | Resp 16

## 2019-01-20 DIAGNOSIS — Z5111 Encounter for antineoplastic chemotherapy: Secondary | ICD-10-CM | POA: Diagnosis not present

## 2019-01-20 DIAGNOSIS — C3411 Malignant neoplasm of upper lobe, right bronchus or lung: Secondary | ICD-10-CM

## 2019-01-20 MED ORDER — SODIUM CHLORIDE 0.9 % IV SOLN
Freq: Once | INTRAVENOUS | Status: AC
  Administered 2019-01-20: 09:00:00 via INTRAVENOUS
  Filled 2019-01-20: qty 250

## 2019-01-20 MED ORDER — DEXAMETHASONE SODIUM PHOSPHATE 10 MG/ML IJ SOLN
10.0000 mg | Freq: Once | INTRAMUSCULAR | Status: AC
  Administered 2019-01-20: 10 mg via INTRAVENOUS

## 2019-01-20 MED ORDER — DEXAMETHASONE SODIUM PHOSPHATE 10 MG/ML IJ SOLN
INTRAMUSCULAR | Status: AC
  Filled 2019-01-20: qty 1

## 2019-01-20 MED ORDER — SODIUM CHLORIDE 0.9 % IV SOLN
100.0000 mg/m2 | Freq: Once | INTRAVENOUS | Status: AC
  Administered 2019-01-20: 180 mg via INTRAVENOUS
  Filled 2019-01-20: qty 9

## 2019-01-20 NOTE — Progress Notes (Signed)
Spoke with Dr. Inda Merlin regarding patient missed yesterday, 01/19/19 dose of etoposide. He wishes to hold third dose of etoposide this round instead of waiting until Monday for third dose. Pharamacy notified of this change.

## 2019-01-20 NOTE — Patient Instructions (Signed)
Etoposide, VP-16 injection What is this medicine? ETOPOSIDE, VP-16 (e toe POE side) is a chemotherapy drug. It is used to treat testicular cancer, lung cancer, and other cancers. This medicine may be used for other purposes; ask your health care provider or pharmacist if you have questions. COMMON BRAND NAME(S): Etopophos, Toposar, VePesid What should I tell my health care provider before I take this medicine? They need to know if you have any of these conditions: -infection -kidney disease -liver disease -low blood counts, like low white cell, platelet, or red cell counts -an unusual or allergic reaction to etoposide, other medicines, foods, dyes, or preservatives -pregnant or trying to get pregnant -breast-feeding How should I use this medicine? This medicine is for infusion into a vein. It is administered in a hospital or clinic by a specially trained health care professional. Talk to your pediatrician regarding the use of this medicine in children. Special care may be needed. Overdosage: If you think you have taken too much of this medicine contact a poison control center or emergency room at once. NOTE: This medicine is only for you. Do not share this medicine with others. What if I miss a dose? It is important not to miss your dose. Call your doctor or health care professional if you are unable to keep an appointment. What may interact with this medicine? -aspirin -certain medications for seizures like carbamazepine, phenobarbital, phenytoin, valproic acid -cyclosporine -levamisole -warfarin This list may not describe all possible interactions. Give your health care provider a list of all the medicines, herbs, non-prescription drugs, or dietary supplements you use. Also tell them if you smoke, drink alcohol, or use illegal drugs. Some items may interact with your medicine. What should I watch for while using this medicine? Visit your doctor for checks on your progress. This drug  may make you feel generally unwell. This is not uncommon, as chemotherapy can affect healthy cells as well as cancer cells. Report any side effects. Continue your course of treatment even though you feel ill unless your doctor tells you to stop. In some cases, you may be given additional medicines to help with side effects. Follow all directions for their use. Call your doctor or health care professional for advice if you get a fever, chills or sore throat, or other symptoms of a cold or flu. Do not treat yourself. This drug decreases your body's ability to fight infections. Try to avoid being around people who are sick. This medicine may increase your risk to bruise or bleed. Call your doctor or health care professional if you notice any unusual bleeding. Talk to your doctor about your risk of cancer. You may be more at risk for certain types of cancers if you take this medicine. Do not become pregnant while taking this medicine or for at least 6 months after stopping it. Women should inform their doctor if they wish to become pregnant or think they might be pregnant. Women of child-bearing potential will need to have a negative pregnancy test before starting this medicine. There is a potential for serious side effects to an unborn child. Talk to your health care professional or pharmacist for more information. Do not breast-feed an infant while taking this medicine. Men must use a latex condom during sexual contact with a woman while taking this medicine and for at least 4 months after stopping it. A latex condom is needed even if you have had a vasectomy. Contact your doctor right away if your partner becomes pregnant. Do   not donate sperm while taking this medicine and for at least 4 months after you stop taking this medicine. Men should inform their doctors if they wish to father a child. This medicine may lower sperm counts. What side effects may I notice from receiving this medicine? Side effects that  you should report to your doctor or health care professional as soon as possible: -allergic reactions like skin rash, itching or hives, swelling of the face, lips, or tongue -low blood counts - this medicine may decrease the number of white blood cells, red blood cells and platelets. You may be at increased risk for infections and bleeding. -signs of infection - fever or chills, cough, sore throat, pain or difficulty passing urine -signs of decreased platelets or bleeding - bruising, pinpoint red spots on the skin, black, tarry stools, blood in the urine -signs of decreased red blood cells - unusually weak or tired, fainting spells, lightheadedness -breathing problems -changes in vision -mouth or throat sores or ulcers -pain, redness, swelling or irritation at the injection site -pain, tingling, numbness in the hands or feet -redness, blistering, peeling or loosening of the skin, including inside the mouth -seizures -vomiting Side effects that usually do not require medical attention (report to your doctor or health care professional if they continue or are bothersome): -diarrhea -hair loss -loss of appetite -nausea -stomach pain This list may not describe all possible side effects. Call your doctor for medical advice about side effects. You may report side effects to FDA at 1-800-FDA-1088. Where should I keep my medicine? This drug is given in a hospital or clinic and will not be stored at home. NOTE: This sheet is a summary. It may not cover all possible information. If you have questions about this medicine, talk to your doctor, pharmacist, or health care provider.  2019 Elsevier/Gold Standard (2015-10-18 11:53:23)  

## 2019-01-23 ENCOUNTER — Ambulatory Visit: Payer: Self-pay | Admitting: Adult Health

## 2019-01-23 ENCOUNTER — Inpatient Hospital Stay (HOSPITAL_BASED_OUTPATIENT_CLINIC_OR_DEPARTMENT_OTHER): Payer: HMO | Admitting: Physician Assistant

## 2019-01-23 ENCOUNTER — Encounter: Payer: Self-pay | Admitting: Physician Assistant

## 2019-01-23 ENCOUNTER — Inpatient Hospital Stay: Payer: HMO

## 2019-01-23 ENCOUNTER — Other Ambulatory Visit: Payer: Self-pay

## 2019-01-23 VITALS — BP 184/88 | HR 55 | Temp 97.9°F | Resp 20 | Ht 65.0 in | Wt 163.5 lb

## 2019-01-23 DIAGNOSIS — E1140 Type 2 diabetes mellitus with diabetic neuropathy, unspecified: Secondary | ICD-10-CM

## 2019-01-23 DIAGNOSIS — Z794 Long term (current) use of insulin: Secondary | ICD-10-CM

## 2019-01-23 DIAGNOSIS — I10 Essential (primary) hypertension: Secondary | ICD-10-CM

## 2019-01-23 DIAGNOSIS — C3411 Malignant neoplasm of upper lobe, right bronchus or lung: Secondary | ICD-10-CM

## 2019-01-23 DIAGNOSIS — C3412 Malignant neoplasm of upper lobe, left bronchus or lung: Secondary | ICD-10-CM

## 2019-01-23 DIAGNOSIS — Z5111 Encounter for antineoplastic chemotherapy: Secondary | ICD-10-CM | POA: Diagnosis not present

## 2019-01-23 DIAGNOSIS — Z7982 Long term (current) use of aspirin: Secondary | ICD-10-CM | POA: Diagnosis not present

## 2019-01-23 DIAGNOSIS — Z79899 Other long term (current) drug therapy: Secondary | ICD-10-CM | POA: Diagnosis not present

## 2019-01-23 LAB — CBC WITH DIFFERENTIAL (CANCER CENTER ONLY)
Abs Immature Granulocytes: 0.01 10*3/uL (ref 0.00–0.07)
BASOS ABS: 0 10*3/uL (ref 0.0–0.1)
Basophils Relative: 1 %
Eosinophils Absolute: 0.1 10*3/uL (ref 0.0–0.5)
Eosinophils Relative: 2 %
HCT: 34.9 % — ABNORMAL LOW (ref 36.0–46.0)
Hemoglobin: 10.3 g/dL — ABNORMAL LOW (ref 12.0–15.0)
Immature Granulocytes: 0 %
LYMPHS PCT: 42 %
Lymphs Abs: 2.2 10*3/uL (ref 0.7–4.0)
MCH: 24.6 pg — ABNORMAL LOW (ref 26.0–34.0)
MCHC: 29.5 g/dL — ABNORMAL LOW (ref 30.0–36.0)
MCV: 83.5 fL (ref 80.0–100.0)
Monocytes Absolute: 0 10*3/uL — ABNORMAL LOW (ref 0.1–1.0)
Monocytes Relative: 1 %
NRBC: 0 % (ref 0.0–0.2)
Neutro Abs: 2.8 10*3/uL (ref 1.7–7.7)
Neutrophils Relative %: 54 %
Platelet Count: 420 10*3/uL — ABNORMAL HIGH (ref 150–400)
RBC: 4.18 MIL/uL (ref 3.87–5.11)
RDW: 16.4 % — ABNORMAL HIGH (ref 11.5–15.5)
WBC Count: 5.2 10*3/uL (ref 4.0–10.5)

## 2019-01-23 LAB — CMP (CANCER CENTER ONLY)
ALT: 12 U/L (ref 0–44)
AST: 13 U/L — AB (ref 15–41)
Albumin: 3 g/dL — ABNORMAL LOW (ref 3.5–5.0)
Alkaline Phosphatase: 87 U/L (ref 38–126)
Anion gap: 9 (ref 5–15)
BUN: 18 mg/dL (ref 8–23)
CO2: 25 mmol/L (ref 22–32)
Calcium: 8.8 mg/dL — ABNORMAL LOW (ref 8.9–10.3)
Chloride: 108 mmol/L (ref 98–111)
Creatinine: 1.25 mg/dL — ABNORMAL HIGH (ref 0.44–1.00)
GFR, Est AFR Am: 52 mL/min — ABNORMAL LOW (ref >60)
GFR, Estimated: 45 mL/min — ABNORMAL LOW (ref >60)
Glucose, Bld: 154 mg/dL — ABNORMAL HIGH (ref 70–99)
Potassium: 4 mmol/L (ref 3.5–5.1)
Sodium: 142 mmol/L (ref 135–145)
Total Bilirubin: 0.4 mg/dL (ref 0.3–1.2)
Total Protein: 6.9 g/dL (ref 6.5–8.1)

## 2019-01-23 MED ORDER — PEGFILGRASTIM-CBQV 6 MG/0.6ML ~~LOC~~ SOSY
6.0000 mg | PREFILLED_SYRINGE | Freq: Once | SUBCUTANEOUS | Status: AC
  Administered 2019-01-23: 6 mg via SUBCUTANEOUS

## 2019-01-23 MED ORDER — PEGFILGRASTIM-CBQV 6 MG/0.6ML ~~LOC~~ SOSY
PREFILLED_SYRINGE | SUBCUTANEOUS | Status: AC
  Filled 2019-01-23: qty 0.6

## 2019-01-23 NOTE — Telephone Encounter (Signed)
I returned pt's call.  "The pain is intensifying in my hands and legs".   The people at the [facility] said my BP was high this morning.   You want Tommi Rumps to prescribe you more medicine for your pain.  I have made an appt with [name], AGNP-C for 01/24/2019 at 3:00.    Reason for Disposition . [0] Systolic BP  >= 511 OR Diastolic >= 80 AND [0] taking BP medications  Answer Assessment - Initial Assessment Questions 1. BLOOD PRESSURE: "What is the blood pressure?" "Did you take at least two measurements 5 minutes apart?"     It was high at the cancer center. 2. ONSET: "When did you take your blood pressure?"     They checked it at the [facility] before my infusion. 3. HOW: "How did you obtain the blood pressure?" (e.g., visiting nurse, automatic home BP monitor)     Nurse did it. 4. HISTORY: "Do you have a history of high blood pressure?"     Yes 5. MEDICATIONS: "Are you taking any medications for blood pressure?" "Have you missed any doses recently?"     Yes 6. OTHER SYMPTOMS: "Do you have any symptoms?" (e.g., headache, chest pain, blurred vision, difficulty breathing, weakness)     No headache or blurred vision 7. PREGNANCY: "Is there any chance you are pregnant?" "When was your last menstrual period?"     N/A  Protocols used: HIGH BLOOD PRESSURE-A-AH

## 2019-01-23 NOTE — Patient Instructions (Addendum)
Hydrate and eat Imodium for diarrhea Call Immediately if a fever of >100.4

## 2019-01-23 NOTE — Progress Notes (Signed)
[name] OFFICE PROGRESS NOTE  Tara Peng, NP [place] [address]  DIAGNOSIS: Limited stage (T1c, N2, M0) small cell lung cancer presented with left upper lobe lung nodule in addition to mediastinal lymphadenopathy diagnosed in February 2020.  PRIOR THERAPY: None  CURRENT THERAPY: Systemic chemotherapy with carboplatin for AUC of 5 on day 1 and etoposide at 100 mg/M2 on days 1, 2 and 3 with Neulasta support every 3 weeks. First dose January 16, 2019. Status post cycle #1.   INTERVAL HISTORY: Tara Hopkins 68 y.o. female returns to the clinic for a follow-up visit. 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 The patient has recently been having difficulty making it to her appointments due to lack of transportation. She missed day 3 of her last treatment due to transportation. Fortunately, she has recently resolved her transportation issues. She lives at home independently in a handicap apartment and gets assistance from her daughter and grandchildren. She tolerated her first treatment fairly well except for nausea, vomiting, and diarrhea. She states that she has nausea with vomiting 1x a day, particularly in the morning. She has compazine at home for nausea which helps, however, sometimes her symptoms are provoked before she is able to take compazine. Additionally, she states that she has about 3-4 loose bowel movements daily since receiving treatment. She has not taken anything to alleviate her diarrhea. She denies associated hematochezia, melena, weight loss, or decreased appetite. She continues to stay hydrated. She denies fever, chills, or night sweats. She denies chest pain, cough, hemoptysis, or shortness of breath. She denies constipation. She denies headache or visual changes. She is here today for a one-week follow-up after completing her first cycle of treatment.     MEDICAL HISTORY:  Past Medical  History:  Diagnosis Date  . Anemia   . Anxiety   . Bipolar 1 disorder ([place])   . Cataracts, bilateral   . Depression   . Diabetes mellitus without complication ([place])   . Glaucoma   . History of blood transfusion   . History of hyperbaric oxygen therapy    pt reports 80 treatments.  . Hypercholesteremia   . Hypertension   . Mediastinal adenopathy   . Peripheral neuropathy   . Pneumonia   . Stroke ([place]) 02/2015  . Wears glasses     ALLERGIES:  has No Known Allergies.  MEDICATIONS:  Current Outpatient Medications  Medication Sig Dispense Refill  . aspirin 325 MG EC tablet Take 1 tablet (325 mg total) by mouth daily. 30 tablet 0  . aspirin EC 81 MG tablet Take 81 mg by mouth daily.    Marland Kitchen atorvastatin (LIPITOR) 40 MG tablet TAKE ONE TABLET BY MOUTH DAILY AT 6 PM 90 tablet 0  . citalopram (CELEXA) 10 MG tablet Take 1 tablet (10 mg total) by mouth daily. 90 tablet 1  . clonazePAM (KLONOPIN) 0.5 MG tablet Take 1 tablet (0.5 mg total) by mouth 3 (three) times daily. 90 tablet 1  . gabapentin (NEURONTIN) 400 MG capsule TAKE 3 CAPSULES BY MOUTH THREE TIMES A DAY (Patient taking differently: Take 1,200 mg by mouth 3 (three) times daily. TAKE 3 CAPSULES BY MOUTH THREE TIMES A DAY) 270 capsule 2  . glimepiride (AMARYL) 4 MG tablet Take 1 tablet (4 mg total) by mouth daily with breakfast. 90 tablet 0  . glucose blood (ONETOUCH VERIO) test strip USE TO TEST BLOOD GLUCOSE 3 TIMES DAILY 100 each 12  .  Insulin Glargine (LANTUS SOLOSTAR) 100 UNIT/ML Solostar Pen Inject 25 Units into the skin at bedtime. (Patient taking differently: Inject 40 Units into the skin at bedtime. ) 15 mL 6  . Insulin Pen Needle (B-D UF III MINI PEN NEEDLES) 31G X 5 MM MISC Use as directed. 100 each 3  . latanoprost (XALATAN) 0.005 % ophthalmic solution INSTILL 1 DROP INTO BOTH EYES EVERY EVENING (Patient taking differently: Place 1 drop into both eyes at bedtime. ) 7.5 mL 0  . lisinopril (PRINIVIL,ZESTRIL) 40 MG tablet Take 1  tablet (40 mg total) by mouth daily. 90 tablet 1  . ONE TOUCH LANCETS MISC USE TO CHECK BLOOD SUGAR 3 TIMES DAILY 200 each 3  . prochlorperazine (COMPAZINE) 10 MG tablet Take 1 tablet (10 mg total) by mouth every 6 (six) hours as needed for nausea or vomiting. 30 tablet 0  . traMADol (ULTRAM) 50 MG tablet Take 1 tablet (50 mg total) by mouth every 12 (twelve) hours as needed. 30 tablet 0   No current facility-administered medications for this visit.     SURGICAL HISTORY:  Past Surgical History:  Procedure Laterality Date  . AMPUTATION Right 12/15/2013   Procedure: Right Transmetatarsal Amputation;  Surgeon: Newt Minion, MD;  Location: [facility];  Service: Orthopedics;  Laterality: Right;  . AMPUTATION Right 02/23/2014   Procedure: AMPUTATION FOOT;  Surgeon: Newt Minion, MD;  Location: [facility];  Service: Orthopedics;  Laterality: Right;  Right Foot Revision Transmetatarsal Amputation  . CATARACT EXTRACTION W/ INTRAOCULAR LENS  IMPLANT, BILATERAL    . COLONOSCOPY W/ BIOPSIES AND POLYPECTOMY    . I&D EXTREMITY Right 05/23/2013   Procedure: IRRIGATION AND DEBRIDEMENT RIGHT GREAT TOE;  Surgeon: Mauri Pole, MD;  Location: [facility];  Service: Orthopedics;  Laterality: Right;  . TUBAL LIGATION    . VIDEO BRONCHOSCOPY WITH ENDOBRONCHIAL ULTRASOUND N/A 12/22/2018   Procedure: VIDEO BRONCHOSCOPY WITH ENDOBRONCHIAL ULTRASOUND;  Surgeon: [name], DO;  Location: [facility];  Service: Thoracic;  Laterality: N/A;    REVIEW OF SYSTEMS:   Review of Systems  Constitutional: Negative for appetite change, chills, fatigue, fever and unexpected weight change.  HENT:   Negative for mouth sores, nosebleeds, sore throat and trouble swallowing.   Eyes: Negative for eye problems and icterus.  Respiratory: Negative for cough, hemoptysis, shortness of breath and wheezing.   Cardiovascular: Negative for chest pain and leg swelling.  Gastrointestinal:Positive for diarrhea, nausea, and vomiting. Negative for abdominal  pain and constipation. Genitourinary: Negative for bladder incontinence, difficulty urinating, dysuria, frequency and hematuria.   Musculoskeletal: Negative for back pain, gait problem, neck pain and neck stiffness.  Skin: Negative for itching and rash.  Neurological: Positive for neuropathy. Negative for dizziness, extremity weakness, gait problem, headaches, light-headedness and seizures.  Hematological: Negative for adenopathy. Does not bruise/bleed easily.  Psychiatric/Behavioral: Negative for confusion, depression and sleep disturbance. The patient is not nervous/anxious.     PHYSICAL EXAMINATION:  Blood pressure (!) 184/88, pulse (!) 55, temperature 97.9 F (36.6 C), temperature source Oral, resp. rate 20, height 5\' 5"  (1.651 m), weight 163 lb 8 oz (74.2 kg), SpO2 100 %.  ECOG PERFORMANCE STATUS: 1 - Symptomatic but completely ambulatory  Physical Exam  Constitutional: Oriented to person, place, and time and well-developed, well-nourished, and in no distress. No distress. Patient examined in her wheelchair. HENT:  Head: Normocephalic and atraumatic.  Mouth/Throat: Oropharynx is clear and moist. No oropharyngeal exudate.  Eyes: Conjunctivae are normal. Right eye exhibits no discharge.  Left eye exhibits no discharge. No scleral icterus.  Neck: Normal range of motion. Neck supple.  Cardiovascular: Normal rate, regular rhythm, normal heart sounds and intact distal pulses.   Pulmonary/Chest: Rhonchi in right lower lobe. Effort normal. No respiratory distress. No wheezes. No rales.  Abdominal: Soft. Bowel sounds are normal. Exhibits no distension and no mass. There is no tenderness.  Musculoskeletal:  Right foot amputation. Normal range of motion. Exhibits no edema.  Lymphadenopathy:    No cervical adenopathy.  Neurological: Alert and oriented to person, place, and time. Exhibits normal muscle tone. Gait normal. Coordination normal.  Skin: Skin is warm and dry. No rash noted. Not  diaphoretic. No erythema. No pallor.  Psychiatric: Mood, memory and judgment normal.  Vitals reviewed.  LABORATORY DATA: Lab Results  Component Value Date   WBC 5.2 01/23/2019   HGB 10.3 (L) 01/23/2019   HCT 34.9 (L) 01/23/2019   MCV 83.5 01/23/2019   PLT 420 (H) 01/23/2019      Chemistry      Component Value Date/Time   NA 142 01/23/2019 0800   K 4.0 01/23/2019 0800   CL 108 01/23/2019 0800   CO2 25 01/23/2019 0800   BUN 18 01/23/2019 0800   CREATININE 1.25 (H) 01/23/2019 0800      Component Value Date/Time   CALCIUM 8.8 (L) 01/23/2019 0800   ALKPHOS 87 01/23/2019 0800   AST 13 (L) 01/23/2019 0800   ALT 12 01/23/2019 0800   BILITOT 0.4 01/23/2019 0800       RADIOGRAPHIC STUDIES:  Mr Jeri Cos IR Contrast  Result Date: 12/28/2018 CLINICAL DATA:  Seizures for 3 months. Difficulty ambulating. Slurred speech. Small cell lung cancer. EXAM: MRI HEAD WITHOUT AND WITH CONTRAST TECHNIQUE: Multiplanar, multiecho pulse sequences of the brain and surrounding structures were obtained without and with intravenous contrast. CONTRAST:  Gadavist 7 mL. COMPARISON:  PET scan 12/01/2018 FINDINGS: The patient was unable to remain motionless for the exam. Small or subtle lesions could be overlooked. Brain: No evidence for acute infarction, hemorrhage, mass lesion, hydrocephalus, or extra-axial fluid. Generalized atrophy. Extensive T2 and FLAIR hyperintensities throughout the white matter, also involving the brainstem, scattered chronic lacunar infarcts, consistent with advanced small vessel disease. Post infusion images are motion degraded. No definite metastatic disease one could not exclude metastases with a high degree of certainty because of the patient motion. Vascular: Normal flow voids. Skull and upper cervical spine: Unremarkable skull base. Pannus. Cervical spondylosis. Sinuses/Orbits: No significant sinus or mastoid fluid. BILATERAL cataract extraction. Other: Trace mastoid effusions,  greater on the LEFT. IMPRESSION: Generalized atrophy.  Advanced small vessel disease. No focal intracranial abnormality contributing to seizures is observed. Motion degraded exam, particularly on post infusion images, demonstrates no definite metastatic disease, see discussion above. Electronically Signed   By: Staci Righter M.D.   On: 12/28/2018 12:03     ASSESSMENT/PLAN:  This is a very pleasant 68 year old African-American female recently diagnosed with limited stage (T1c, N2, M0) small cell lung cancer who presented with a right upper lobe lung nodule in addition to mediastinal lymphadenopathy.  She was diagnosed in February 2020. She is currently undergoing chemotherapy with carboplatin for an AUC of 5 on day 1 and etoposide 100 mg/m on days 1, 2, and 3 with Neulasta support every 3 weeks.  She is status post 1 cycle. She missed day 3 of etoposide last week due to difficulty obtaining transportation. She is meeting with radiation oncology on 02/01/2019 for her consultation.  The patient  was seen with Dr. Julien Nordmann today.  The patient tolerated her first treatment fairly well except for nausea, vomiting, and diarrhea.  I recommend she proceed with cycle #2 of her treatment in 2 weeks.  I will see her back in 2 weeks for evaluation and routine blood.   She was advised to continue taking compazine for nausea.  Recommend that she use imodium OTC for diarrhea.  She has a history of hypertension and neuropathy. Her blood pressure continues to be elevated at her appointments. She states she is going to follow-up with her PCP regarding optimal blood pressure management. She also expresses concern for continued neuropathy. Encouraged the patient to follow-up with her PCP regarding her neuropathy managment. She is currently taking 400 mg TID of gabapentin daily.  The patient was advised to call immediately if she has any concerning symptoms in the interval. The patient voices understanding of current disease  status and treatment options and is in agreement with the current care plan. All questions were answered. The patient knows to call the clinic with any problems, questions or concerns. We can certainly see the patient much sooner if necessary  No orders of the defined types were placed in this encounter.    Mikiya Nebergall L Zamyra Allensworth, PA-C 01/23/19  ADDENDUM: Hematology/Oncology Attending: I had a face-to-face encounter with the patient today.  I recommended her care plan.  This is a very pleasant 68 years old African-American female recently diagnosed with limited stage small cell lung cancer and she is currently undergoing systemic chemotherapy with carboplatin and etoposide status post 1 cycle.  Her treatment started last week and she tolerated the first cycle of her treatment well except for mild nausea.  The patient is feeling very well today with no concerning complaints.  Her CBC is unremarkable except for leukocytosis secondary to Neulasta injection. I recommended for the patient to continue on her treatment and she will proceed with cycle number 22 weeks. She was advised to call immediately if she has any concerning symptoms in the interval. Disclaimer: This note was dictated with voice recognition software. Similar sounding words can inadvertently be transcribed and may be missed upon review. Eilleen Kempf, MD 01/23/19

## 2019-01-24 ENCOUNTER — Ambulatory Visit (INDEPENDENT_AMBULATORY_CARE_PROVIDER_SITE_OTHER): Payer: HMO | Admitting: Adult Health

## 2019-01-24 ENCOUNTER — Encounter: Payer: Self-pay | Admitting: Nutrition

## 2019-01-24 ENCOUNTER — Telehealth: Payer: Self-pay | Admitting: Internal Medicine

## 2019-01-24 ENCOUNTER — Encounter: Payer: Self-pay | Admitting: Adult Health

## 2019-01-24 VITALS — BP 170/74 | HR 63 | Temp 98.3°F

## 2019-01-24 DIAGNOSIS — E118 Type 2 diabetes mellitus with unspecified complications: Secondary | ICD-10-CM | POA: Diagnosis not present

## 2019-01-24 DIAGNOSIS — C3411 Malignant neoplasm of upper lobe, right bronchus or lung: Secondary | ICD-10-CM

## 2019-01-24 DIAGNOSIS — I10 Essential (primary) hypertension: Secondary | ICD-10-CM

## 2019-01-24 DIAGNOSIS — M792 Neuralgia and neuritis, unspecified: Secondary | ICD-10-CM

## 2019-01-24 LAB — POCT GLYCOSYLATED HEMOGLOBIN (HGB A1C): HbA1c, POC (controlled diabetic range): 8.6 % — AB (ref 0.0–7.0)

## 2019-01-24 MED ORDER — AMLODIPINE BESYLATE 5 MG PO TABS: 5.0000 mg | ORAL_TABLET | Freq: Every day | ORAL | 0 refills | Status: DC

## 2019-01-24 MED ORDER — TRAMADOL HCL 50 MG PO TABS: 50.0000 mg | ORAL_TABLET | Freq: Two times a day (BID) | ORAL | 1 refills | Status: DC | PRN

## 2019-01-24 NOTE — Telephone Encounter (Signed)
Patient already scheduled per 03/16 los.

## 2019-01-24 NOTE — Patient Instructions (Addendum)
I am going to restart you on Norvasc 5 mg to help lower your blood pressure   I would like to see you back in 2 weeks for a blood pressure check   Continue to work on diet and exercise   I have refilled your Tramadol for neuropathic pain

## 2019-01-24 NOTE — Progress Notes (Signed)
Subjective:    Patient ID: Tara Hopkins, female    DOB: 1951/09/06, 68 y.o.   MRN: [id number]  HPI  68 year old female who  has a past medical history of Anemia, Anxiety, Bipolar 1 disorder ([place]), Cataracts, bilateral, Depression, Diabetes mellitus without complication ([place]), Glaucoma, History of blood transfusion, History of hyperbaric oxygen therapy, Hypercholesteremia, Hypertension, Mediastinal adenopathy, Peripheral neuropathy, Pneumonia, Stroke ([place]) (02/2015), and Wears glasses.   She presents to the office today for elevated blood pressure readings. Currently going through systemic chemotherapy for small cell lung cancer.  In the clinic her blood pressures have been elevated.  She is currently prescribed lisinopril 40 mg daily and reports taking this every day.  She denies headaches, blurred vision, dizziness, or lightheadedness.  Additionally she needs a refill of tramadol for arthritic and neuropathic pain.  She was started on this a couple months ago and had significant improvement in her pain.  She reports that she is changed her lifestyle since the diagnosis of lung cancer.  She is no longer smoking, doing illicit drugs, and her diet is changed to more lean meats and vegetables.  Reports that she is tolerating chemotherapy well, does have some nausea, vomiting and diarrhea immediately after her treatment.  She reports being in good spirits and does not feel depressed about her diagnosis.   Review of Systems See HPI   Past Medical History:  Diagnosis Date  . Anemia   . Anxiety   . Bipolar 1 disorder ([place])   . Cataracts, bilateral   . Depression   . Diabetes mellitus without complication ([employer])   . Glaucoma   . History of blood transfusion   . History of hyperbaric oxygen therapy    pt reports 80 treatments.  . Hypercholesteremia   . Hypertension   . Mediastinal adenopathy   . Peripheral neuropathy   . Pneumonia   . Stroke ([place]) 02/2015  . Wears glasses      Social History   Socioeconomic History  . Marital status: Married    Spouse name: Not on file  . Number of children: 3  . Years of education: 82  . Highest education level: Not on file  Occupational History  . Occupation: Disbaled  Social Needs  . Financial resource strain: Not on file  . Food insecurity:    Worry: Not on file    Inability: Not on file  . Transportation needs:    Medical: Not on file    Non-medical: Not on file  Tobacco Use  . Smoking status: Former Smoker    Packs/day: 0.50    Years: 40.00    Pack years: 20.00    Types: Cigarettes    Last attempt to quit: 10/17/2018    Years since quitting: 0.2  . Smokeless tobacco: Never Used  Substance and Sexual Activity  . Alcohol use: Not Currently    Alcohol/week: 0.0 standard drinks  . Drug use: Yes    Types: Cocaine, Marijuana    Comment: last use of cocaine last use 12/09/2018  . Sexual activity: Not on file  Lifestyle  . Physical activity:    Days per week: Not on file    Minutes per session: Not on file  . Stress: Not on file  Relationships  . Social connections:    Talks on phone: Not on file    Gets together: Not on file    Attends religious service: Not on file    Active member of club or organization: Not  on file    Attends meetings of clubs or organizations: Not on file    Relationship status: Not on file  . Intimate partner violence:    Fear of current or ex partner: Not on file    Emotionally abused: Not on file    Physically abused: Not on file    Forced sexual activity: Not on file  Other Topics Concern  . Not on file  Social History Narrative   Lives at home alone.   Right-handed.   No caffeine use.   Oldest of 12 children     Past Surgical History:  Procedure Laterality Date  . AMPUTATION Right 12/15/2013   Procedure: Right Transmetatarsal Amputation;  Surgeon: Newt Minion, MD;  Location: [facility];  Service: Orthopedics;  Laterality: Right;  . AMPUTATION Right 02/23/2014   Procedure:  AMPUTATION FOOT;  Surgeon: Newt Minion, MD;  Location: [facility];  Service: Orthopedics;  Laterality: Right;  Right Foot Revision Transmetatarsal Amputation  . CATARACT EXTRACTION W/ INTRAOCULAR LENS  IMPLANT, BILATERAL    . COLONOSCOPY W/ BIOPSIES AND POLYPECTOMY    . I&D EXTREMITY Right 05/23/2013   Procedure: IRRIGATION AND DEBRIDEMENT RIGHT GREAT TOE;  Surgeon: Mauri Pole, MD;  Location: [facility];  Service: Orthopedics;  Laterality: Right;  . TUBAL LIGATION    . VIDEO BRONCHOSCOPY WITH ENDOBRONCHIAL ULTRASOUND N/A 12/22/2018   Procedure: VIDEO BRONCHOSCOPY WITH ENDOBRONCHIAL ULTRASOUND;  Surgeon: [name], DO;  Location: [facility];  Service: Thoracic;  Laterality: N/A;    Family History  Problem Relation Age of Onset  . Liver cancer Mother   . Lung cancer Mother        smoked  . Depression Mother   . Hypertension Father   . Depression Father   . Colon cancer Neg Hx     No Known Allergies  Current Outpatient Medications on File Prior to Visit  Medication Sig Dispense Refill  . aspirin 325 MG EC tablet Take 1 tablet (325 mg total) by mouth daily. 30 tablet 0  . aspirin EC 81 MG tablet Take 81 mg by mouth daily.    Marland Kitchen atorvastatin (LIPITOR) 40 MG tablet TAKE ONE TABLET BY MOUTH DAILY AT 6 PM 90 tablet 0  . clonazePAM (KLONOPIN) 0.5 MG tablet Take 1 tablet (0.5 mg total) by mouth 3 (three) times daily. 90 tablet 1  . gabapentin (NEURONTIN) 400 MG capsule TAKE 3 CAPSULES BY MOUTH THREE TIMES A DAY (Patient taking differently: Take 1,200 mg by mouth 3 (three) times daily. TAKE 3 CAPSULES BY MOUTH THREE TIMES A DAY) 270 capsule 2  . glimepiride (AMARYL) 4 MG tablet Take 1 tablet (4 mg total) by mouth daily with breakfast. 90 tablet 0  . glucose blood (ONETOUCH VERIO) test strip USE TO TEST BLOOD GLUCOSE 3 TIMES DAILY 100 each 12  . Insulin Glargine (LANTUS SOLOSTAR) 100 UNIT/ML Solostar Pen Inject 25 Units into the skin at bedtime. (Patient taking differently: Inject 40 Units into the  skin at bedtime. ) 15 mL 6  . Insulin Pen Needle (B-D UF III MINI PEN NEEDLES) 31G X 5 MM MISC Use as directed. 100 each 3  . latanoprost (XALATAN) 0.005 % ophthalmic solution INSTILL 1 DROP INTO BOTH EYES EVERY EVENING (Patient taking differently: Place 1 drop into both eyes at bedtime. ) 7.5 mL 0  . lisinopril (PRINIVIL,ZESTRIL) 40 MG tablet Take 1 tablet (40 mg total) by mouth daily. 90 tablet 1  . ONE TOUCH LANCETS MISC USE TO  CHECK BLOOD SUGAR 3 TIMES DAILY 200 each 3  . prochlorperazine (COMPAZINE) 10 MG tablet Take 1 tablet (10 mg total) by mouth every 6 (six) hours as needed for nausea or vomiting. 30 tablet 0  . citalopram (CELEXA) 10 MG tablet Take 1 tablet (10 mg total) by mouth daily. 90 tablet 1   No current facility-administered medications on file prior to visit.     BP (!) 170/74   Pulse 63   Temp 98.3 F (36.8 C)   SpO2 96%       Objective:   Physical Exam Vitals signs and nursing note reviewed.  Constitutional:      Appearance: Normal appearance.  HENT:     Mouth/Throat:     Mouth: Mucous membranes are moist.     Pharynx: Oropharynx is clear.  Cardiovascular:     Rate and Rhythm: Normal rate and regular rhythm.     Pulses: Normal pulses.     Heart sounds: Normal heart sounds.  Pulmonary:     Effort: Pulmonary effort is normal.     Breath sounds: Normal breath sounds.  Musculoskeletal: Normal range of motion.     Comments: Sitting in wheelchair during exam  Skin:    General: Skin is warm and dry.  Neurological:     General: No focal deficit present.     Mental Status: She is alert and oriented to person, place, and time.  Psychiatric:        Mood and Affect: Mood normal.        Behavior: Behavior normal.        Thought Content: Thought content normal.        Judgment: Judgment normal.       Assessment & Plan:  1. Essential hypertension -Add Norvasc 5 mg tablet.  She is advised to take this with lisinopril.  Return precautions and side effects  reviewed with the patient.  Follow-up in 2 weeks for blood pressure check - amLODipine (NORVASC) 5 MG tablet; Take 1 tablet (5 mg total) by mouth daily for 30 days.  Dispense: 30 tablet; Refill: 0  2. Neuropathic pain  - traMADol (ULTRAM) 50 MG tablet; Take 1 tablet (50 mg total) by mouth every 12 (twelve) hours as needed for up to 30 days.  Dispense: 60 tablet; Refill: 1  3. Diabetes mellitus with complication ([facility])  - POCT A1C - 8.6  -Change in medications at this time since he started working on lifestyle modifications.  We will have her follow-up in 3 months consider increase in Lantus  4. 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  Advised to follow-up if she needs anything going forward  [name], NP

## 2019-01-27 ENCOUNTER — Ambulatory Visit: Payer: HMO | Admitting: Podiatry

## 2019-01-27 DIAGNOSIS — M869 Osteomyelitis, unspecified: Secondary | ICD-10-CM | POA: Diagnosis not present

## 2019-01-27 DIAGNOSIS — E1142 Type 2 diabetes mellitus with diabetic polyneuropathy: Secondary | ICD-10-CM | POA: Diagnosis not present

## 2019-01-27 DIAGNOSIS — E113512 Type 2 diabetes mellitus with proliferative diabetic retinopathy with macular edema, left eye: Secondary | ICD-10-CM | POA: Diagnosis not present

## 2019-01-27 DIAGNOSIS — E113511 Type 2 diabetes mellitus with proliferative diabetic retinopathy with macular edema, right eye: Secondary | ICD-10-CM | POA: Diagnosis not present

## 2019-01-27 DIAGNOSIS — I639 Cerebral infarction, unspecified: Secondary | ICD-10-CM | POA: Diagnosis not present

## 2019-01-27 DIAGNOSIS — G8194 Hemiplegia, unspecified affecting left nondominant side: Secondary | ICD-10-CM | POA: Diagnosis not present

## 2019-01-29 ENCOUNTER — Other Ambulatory Visit: Payer: Self-pay | Admitting: Internal Medicine

## 2019-01-30 ENCOUNTER — Inpatient Hospital Stay: Payer: HMO

## 2019-01-30 ENCOUNTER — Other Ambulatory Visit: Payer: Self-pay

## 2019-01-30 ENCOUNTER — Encounter: Payer: Self-pay | Admitting: Internal Medicine

## 2019-01-30 DIAGNOSIS — Z5111 Encounter for antineoplastic chemotherapy: Secondary | ICD-10-CM | POA: Diagnosis not present

## 2019-01-30 DIAGNOSIS — C3411 Malignant neoplasm of upper lobe, right bronchus or lung: Secondary | ICD-10-CM

## 2019-01-30 LAB — CMP (CANCER CENTER ONLY)
ALBUMIN: 3.3 g/dL — AB (ref 3.5–5.0)
ALT: 12 U/L (ref 0–44)
AST: 11 U/L — ABNORMAL LOW (ref 15–41)
Alkaline Phosphatase: 118 U/L (ref 38–126)
Anion gap: 9 (ref 5–15)
BUN: 20 mg/dL (ref 8–23)
CHLORIDE: 111 mmol/L (ref 98–111)
CO2: 21 mmol/L — ABNORMAL LOW (ref 22–32)
Calcium: 8.6 mg/dL — ABNORMAL LOW (ref 8.9–10.3)
Creatinine: 1.33 mg/dL — ABNORMAL HIGH (ref 0.44–1.00)
GFR, Est AFR Am: 48 mL/min — ABNORMAL LOW (ref >60)
GFR, Estimated: 42 mL/min — ABNORMAL LOW (ref >60)
Glucose, Bld: 212 mg/dL — ABNORMAL HIGH (ref 70–99)
Potassium: 4 mmol/L (ref 3.5–5.1)
Sodium: 141 mmol/L (ref 135–145)
Total Bilirubin: <0.2 mg/dL — ABNORMAL LOW (ref 0.3–1.2)
Total Protein: 7 g/dL (ref 6.5–8.1)

## 2019-01-30 LAB — CBC WITH DIFFERENTIAL (CANCER CENTER ONLY)
Abs Immature Granulocytes: 0.83 10*3/uL — ABNORMAL HIGH (ref 0.00–0.07)
Basophils Absolute: 0.1 10*3/uL (ref 0.0–0.1)
Basophils Relative: 1 %
Eosinophils Absolute: 0.1 10*3/uL (ref 0.0–0.5)
Eosinophils Relative: 1 %
HCT: 31.9 % — ABNORMAL LOW (ref 36.0–46.0)
Hemoglobin: 9.7 g/dL — ABNORMAL LOW (ref 12.0–15.0)
Immature Granulocytes: 7 %
Lymphocytes Relative: 22 %
Lymphs Abs: 2.6 10*3/uL (ref 0.7–4.0)
MCH: 25.6 pg — ABNORMAL LOW (ref 26.0–34.0)
MCHC: 30.4 g/dL (ref 30.0–36.0)
MCV: 84.2 fL (ref 80.0–100.0)
MONOS PCT: 5 %
Monocytes Absolute: 0.6 10*3/uL (ref 0.1–1.0)
Neutro Abs: 7.9 10*3/uL — ABNORMAL HIGH (ref 1.7–7.7)
Neutrophils Relative %: 64 %
Platelet Count: 221 10*3/uL (ref 150–400)
RBC: 3.79 MIL/uL — ABNORMAL LOW (ref 3.87–5.11)
RDW: 16.7 % — ABNORMAL HIGH (ref 11.5–15.5)
WBC Count: 12.2 10*3/uL — ABNORMAL HIGH (ref 4.0–10.5)
nRBC: 0.2 % (ref 0.0–0.2)

## 2019-01-30 NOTE — Progress Notes (Signed)
Patient dropped off proof of income for one-time $700 [employer].  Patient approved for the grant. She signed documents in the lobby. Made her a copy of the expense sheet as well as the Outpatient pharmacy information. Explained to her how expenses are covered briefly as she was waiting for SCAT.  She has my card for any additional financial questions or concerns.

## 2019-01-31 ENCOUNTER — Telehealth: Payer: Self-pay | Admitting: *Deleted

## 2019-01-31 ENCOUNTER — Telehealth: Payer: Self-pay

## 2019-01-31 NOTE — Telephone Encounter (Signed)
Contacted pt to convey the current visitor restrictions due to Covid 19. Pt is agreeable to present to appt tomorrow without visitors/family present. Loma Sousa, RN BSN

## 2019-01-31 NOTE — Telephone Encounter (Signed)
Copied from [place] [id number]. Topic: General - Other >> Jan 31, 2019 11:45 AM Yvette Rack wrote: Reason for CRM: Pt stated she is going through cancer treatment and she would like [place] to approved for a nurse to come out to her home.

## 2019-01-31 NOTE — Telephone Encounter (Signed)
For what reasons?

## 2019-01-31 NOTE — Progress Notes (Signed)
Thoracic Location of Tumor / Histology: 1.0 cm right upper lobe hypermetabolic nodule.  Patient presented  with symptoms of: The patient has a fall in December 2019 and she was seen at the emergency department for evaluation.  During her evaluation she had chest x-ray performed on October 16, 2018 and that showed prominent right paratracheal density.  This was followed by CT scan of the chest on October 28, 2018 and that showed bulky necrotic mediastinal lymphadenopathy including index right paratracheal lymph node measuring 2.3 cm in short axis, subcarinal 3.1 cm.  There was also 1.0 cm posterior right upper lobe nodule identified with adjacent tiny satellite nodules and 0.3 cm posterior left upper lobe nodule.  A PET scan was performed on December 01, 2018 and that showed mediastinal lymphadenopathy markedly hypermetabolic.  No evidence for hypermetabolic lymphadenopathy in the axillary region or abdomen/pelvis.  There was also hypermetabolic FDG accumulation in the tonsillar regions bilaterally and posterior left nasopharynx/oropharynx that was indeterminate.  The scan also showed 1.0 cm right upper lobe hypermetabolic nodule.  Biopsies revealed: 12/22/18:  Diagnosis FINE NEEDLE ASPIRATION, EBUS 7 NODE, A (SPECIMEN 1 OF 1, COLLECTED 12/22/18): - SMALL CELL CARCINOMA, SEE COMMENT.  Tobacco/Marijuana/Snuff/ETOH use:  Tobacco Use  . Smoking status: Former Smoker    Packs/day: 0.50    Years: 40.00    Pack years: 20.00    Types: Cigarettes    Last attempt to quit: 10/17/2018    Years since quitting: 0.2  . Smokeless tobacco: Never Used  Substance Use Topics  . Alcohol use: Not Currently    Alcohol/week: 0.0 standard drinks  . Drug use: Yes    Types: Cocaine, Marijuana    Comment: last use of cocaine last use 12/09/2018    Past/Anticipated interventions by cardiothoracic surgery, if any: None at this time.  Past/Anticipated interventions by medical oncology, if any: Per Dr.  Julien Nordmann 01/23/19:  She is currently undergoing chemotherapy with carboplatin for an AUC of 5 on day 1 and etoposide 100 mg/m on days 1, 2, and 3 with Neulasta support every 3 weeks.  She is status post 1 cycle. She missed day 3 of etoposide last week due to difficulty obtaining transportation. She is meeting with radiation oncology on 02/01/2019 for her consultation.  The patient was seen with Dr. Julien Nordmann today.  The patient tolerated her first treatment fairly well except for nausea, vomiting, and diarrhea.  I recommend she proceed with cycle #2 of her treatment in 2 weeks.  I will see her back in 2 weeks for evaluation and routine blood.   She was advised to continue taking compazine for nausea.  Recommend that she use imodium OTC for diarrhea.  She has a history of hypertension and neuropathy. Her blood pressure continues to be elevated at her appointments. She states she is going to follow-up with her PCP regarding optimal blood pressure management. She also expresses concern for continued neuropathy. Encouraged the patient to follow-up with her PCP regarding her neuropathy managment. She is currently taking 400 mg TID of gabapentin daily.  Signs/Symptoms  Weight changes, if any: She lost around 10 pounds in the last few months.  Respiratory complaints, if any: She denied having any current chest pain, shortness of breath but continues to have dry cough with no hemoptysis.  Hemoptysis, if any: No  Pain issues, if any: Pt denies c/o pain. The patient denied having any nausea, vomiting, diarrhea or constipation.  SAFETY ISSUES:  Prior radiation? No  Pacemaker/ICD? No   Possible  current pregnancy? No  Is the patient on methotrexate? No  Current Complaints / other details:  Pt presents today for initial consult with Dr. Sondra Come for Radiation Oncology. Pt is unaccompanied.   BP (!) 169/85 (BP Location: Left Arm, Patient Position: Sitting)   Pulse 63   Temp 98.3 F (36.8 C) (Oral)    Resp 20   Ht 5\' 5"  (1.651 m)   Wt 164 lb 9.6 oz (74.7 kg)   SpO2 97%   BMI 27.39 kg/m   Wt Readings from Last 3 Encounters:  02/01/19 164 lb 9.6 oz (74.7 kg)  01/23/19 163 lb 8 oz (74.2 kg)  01/06/19 157 lb 3.2 oz (71.3 kg)   Loma Sousa, RN BSN

## 2019-02-01 ENCOUNTER — Other Ambulatory Visit: Payer: Self-pay

## 2019-02-01 ENCOUNTER — Ambulatory Visit
Admission: RE | Admit: 2019-02-01 | Discharge: 2019-02-01 | Disposition: A | Discharge status: Home / Self Care | Payer: HMO | Source: Ambulatory Visit | Attending: Radiation Oncology | Admitting: Radiation Oncology

## 2019-02-01 ENCOUNTER — Encounter: Payer: Self-pay | Admitting: Radiation Oncology

## 2019-02-01 VITALS — BP 169/85 | HR 63 | Temp 98.3°F | Resp 20 | Ht 65.0 in | Wt 164.6 lb

## 2019-02-01 DIAGNOSIS — Z800 Family history of malignant neoplasm of digestive organs: Secondary | ICD-10-CM | POA: Diagnosis not present

## 2019-02-01 DIAGNOSIS — H409 Unspecified glaucoma: Secondary | ICD-10-CM | POA: Diagnosis not present

## 2019-02-01 DIAGNOSIS — C3411 Malignant neoplasm of upper lobe, right bronchus or lung: Secondary | ICD-10-CM

## 2019-02-01 DIAGNOSIS — F319 Bipolar disorder, unspecified: Secondary | ICD-10-CM | POA: Diagnosis not present

## 2019-02-01 DIAGNOSIS — Z8673 Personal history of transient ischemic attack (TIA), and cerebral infarction without residual deficits: Secondary | ICD-10-CM | POA: Diagnosis not present

## 2019-02-01 DIAGNOSIS — F17211 Nicotine dependence, cigarettes, in remission: Secondary | ICD-10-CM | POA: Diagnosis not present

## 2019-02-01 DIAGNOSIS — Z794 Long term (current) use of insulin: Secondary | ICD-10-CM | POA: Diagnosis not present

## 2019-02-01 DIAGNOSIS — Z7982 Long term (current) use of aspirin: Secondary | ICD-10-CM | POA: Diagnosis not present

## 2019-02-01 DIAGNOSIS — R590 Localized enlarged lymph nodes: Secondary | ICD-10-CM | POA: Diagnosis not present

## 2019-02-01 DIAGNOSIS — Z79899 Other long term (current) drug therapy: Secondary | ICD-10-CM | POA: Diagnosis not present

## 2019-02-01 DIAGNOSIS — Z801 Family history of malignant neoplasm of trachea, bronchus and lung: Secondary | ICD-10-CM | POA: Diagnosis not present

## 2019-02-01 DIAGNOSIS — E7800 Pure hypercholesterolemia, unspecified: Secondary | ICD-10-CM | POA: Diagnosis not present

## 2019-02-01 DIAGNOSIS — I10 Essential (primary) hypertension: Secondary | ICD-10-CM | POA: Diagnosis not present

## 2019-02-01 DIAGNOSIS — Z89431 Acquired absence of right foot: Secondary | ICD-10-CM | POA: Diagnosis not present

## 2019-02-01 DIAGNOSIS — Z8249 Family history of ischemic heart disease and other diseases of the circulatory system: Secondary | ICD-10-CM | POA: Diagnosis not present

## 2019-02-01 DIAGNOSIS — Z818 Family history of other mental and behavioral disorders: Secondary | ICD-10-CM | POA: Diagnosis not present

## 2019-02-01 DIAGNOSIS — F419 Anxiety disorder, unspecified: Secondary | ICD-10-CM | POA: Diagnosis not present

## 2019-02-01 DIAGNOSIS — Z87891 Personal history of nicotine dependence: Secondary | ICD-10-CM | POA: Diagnosis not present

## 2019-02-01 DIAGNOSIS — E1142 Type 2 diabetes mellitus with diabetic polyneuropathy: Secondary | ICD-10-CM | POA: Diagnosis not present

## 2019-02-01 NOTE — Progress Notes (Signed)
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 (T1c, N2, M0) small cell lung cancer (limited stage) presenting in the  right upper lobe lung nodule in addition to mediastinal lymphadenopathy diagnosed in February 2020  Marion is a 68 y.o. female who is presenting to the office today for evaluation of right lung cancer. The patient had a fall in December 2019 and she was seen at the emergency department for evaluation.  During her evaluation she had chest x-ray performed on 10/16/18 and that showed prominent right paratracheal density.  This was followed by CT scan of the chest on 10/28/18 and that showed bulky necrotic mediastinal lymphadenopathy including index right paratracheal lymph node measuring 2.3 cm in short axis, subcarinal 3.1 cm.  There was also 1.0 cm posterior right upper lobe nodule identified with adjacent tiny satellite nodules and 0.3 cm posterior left upper lobe nodule. She was referred to Dr. Melvyn Novas at [facility] after the abnormal CT scan. During this visit on 11/10/18 he ordered a PET scan.  A PET scan was performed on 12/01/18 and that showed mediastinal lymphadenopathy markedly hypermetabolic.  No evidence for hypermetabolic lymphadenopathy in the axillary region or abdomen/pelvis.  There was also hypermetabolic FDG accumulation in the tonsillar regions bilaterally and posterior left nasopharynx/oropharynx that was indeterminate.  The scan also showed 1.0 cm right upper lobe hypermetabolic nodule.She had bronchoscopy with biopsies on 12/22/18 by Dr. [name]. The final cytology (NZA [id number]) was consistent with a small cell carcinoma.  Immunostains for synaptophysin, TTF1, CD56 and AE1/AE3 are positive in the tumor cells. Immunostain for Napsin A is negative. This immunoprofile is consistent with the above diagnosis. Ki-67 shows a proliferative index of about 50-60%.  The patient also had MRI of the brain performed on December 28, 2018 that showed no evidence of metastatic disease although it was motion degraded. She consulted with Dr. Julien Nordmann on 01/06/19 who recommended concurrent chemoradiation. She began Carboplatin, VP-16 on 3/11. She is here today on referral from Dr. Julien Nordmann to discuss radiotherapy options.    she had no symptoms to report today.    PREVIOUS RADIATION THERAPY: No  PAST MEDICAL HISTORY:  has a past medical history of Anemia, Anxiety, Bipolar 1 disorder ([place]), Cataracts, bilateral, Depression, Diabetes mellitus without complication ([place]), Glaucoma, History of blood transfusion, History of hyperbaric oxygen therapy, Hypercholesteremia, Hypertension, Mediastinal adenopathy, Peripheral neuropathy, Pneumonia, Stroke ([place]) (02/2015), and Wears glasses.    PAST SURGICAL HISTORY: Past Surgical History:  Procedure Laterality Date   AMPUTATION Right 12/15/2013   Procedure: Right Transmetatarsal Amputation;  Surgeon: Newt Minion, MD;  Location: [facility];  Service: Orthopedics;  Laterality: Right;   AMPUTATION Right 02/23/2014   Procedure: AMPUTATION FOOT;  Surgeon: Newt Minion, MD;  Location: [facility];  Service: Orthopedics;  Laterality: Right;  Right Foot Revision Transmetatarsal Amputation   CATARACT EXTRACTION W/ INTRAOCULAR LENS  IMPLANT, BILATERAL     COLONOSCOPY W/ BIOPSIES AND POLYPECTOMY     I&D EXTREMITY Right 05/23/2013   Procedure: IRRIGATION AND DEBRIDEMENT RIGHT GREAT TOE;  Surgeon: Mauri Pole, MD;  Location: [facility];  Service: Orthopedics;  Laterality: Right;   TUBAL LIGATION     VIDEO BRONCHOSCOPY WITH  ENDOBRONCHIAL ULTRASOUND N/A 12/22/2018   Procedure: VIDEO BRONCHOSCOPY WITH ENDOBRONCHIAL  ULTRASOUND;  Surgeon: [name], DO;  Location: [facility];  Service: Thoracic;  Laterality: N/A;    FAMILY HISTORY: family history includes Depression in her father and mother; Hypertension in her father; Liver cancer in her mother; Lung cancer in her mother.  SOCIAL HISTORY:  reports that she quit smoking about 3 months ago. Her smoking use included cigarettes. She has a 20.00 pack-year smoking history. She has never used smokeless tobacco. She reports previous alcohol use. She reports current drug use. Drugs: Cocaine and Marijuana.  ALLERGIES: Patient has no known allergies.  MEDICATIONS:  Current Outpatient Medications  Medication Sig Dispense Refill   amLODipine (NORVASC) 5 MG tablet Take 1 tablet (5 mg total) by mouth daily for 30 days. 30 tablet 0   aspirin 325 MG EC tablet Take 1 tablet (325 mg total) by mouth daily. 30 tablet 0   aspirin EC 81 MG tablet Take 81 mg by mouth daily.     atorvastatin (LIPITOR) 40 MG tablet TAKE ONE TABLET BY MOUTH DAILY AT 6 PM 90 tablet 0   clonazePAM (KLONOPIN) 0.5 MG tablet Take 1 tablet (0.5 mg total) by mouth 3 (three) times daily. 90 tablet 1   gabapentin (NEURONTIN) 400 MG capsule TAKE 3 CAPSULES BY MOUTH THREE TIMES A DAY (Patient taking differently: Take 1,200 mg by mouth 3 (three) times daily. TAKE 3 CAPSULES BY MOUTH THREE TIMES A DAY) 270 capsule 2   glimepiride (AMARYL) 4 MG tablet Take 1 tablet (4 mg total) by mouth daily with breakfast. 90 tablet 0   glucose blood (ONETOUCH VERIO) test strip USE TO TEST BLOOD GLUCOSE 3 TIMES DAILY 100 each 12   Insulin Glargine (LANTUS SOLOSTAR) 100 UNIT/ML Solostar Pen Inject 25 Units into the skin at bedtime. (Patient taking differently: Inject 40 Units into the skin at bedtime. ) 15 mL 6   Insulin Pen Needle (B-D UF III MINI PEN NEEDLES) 31G X 5 MM MISC Use as directed. 100 each 3   latanoprost (XALATAN) 0.005 % ophthalmic solution INSTILL 1 DROP INTO BOTH EYES EVERY EVENING (Patient taking  differently: Place 1 drop into both eyes at bedtime. ) 7.5 mL 0   lisinopril (PRINIVIL,ZESTRIL) 40 MG tablet Take 1 tablet (40 mg total) by mouth daily. 90 tablet 1   ONE TOUCH LANCETS MISC USE TO CHECK BLOOD SUGAR 3 TIMES DAILY 200 each 3   prochlorperazine (COMPAZINE) 10 MG tablet TAKE ONE TABLET BY MOUTH EVERY 6 HOURS AS NEEDED FOR NAUSEA OR VOMITING 30 tablet 0   traMADol (ULTRAM) 50 MG tablet Take 1 tablet (50 mg total) by mouth every 12 (twelve) hours as needed for up to 30 days. 60 tablet 1   citalopram (CELEXA) 10 MG tablet Take 1 tablet (10 mg total) by mouth daily. 90 tablet 1   No current facility-administered medications for this encounter.     REVIEW OF SYSTEMS:  A 10+ POINT REVIEW OF SYSTEMS WAS OBTAINED including neurology, dermatology, psychiatry, cardiac, respiratory, lymph, extremities, GI, GU, musculoskeletal, constitutional, reproductive, HEENT. All pertinent positives are noted in the HPI. All others are negative.    PHYSICAL EXAM:  height is _0  (1.651 m) and weight is 164 lb 9.6 oz (74.7 kg). Her oral temperature is 98.3 F (36.8 C). Her blood pressure is 169/85 (abnormal) and her pulse is 63. Her respiration is 20 and oxygen saturation is 97%.   General: Alert and oriented, in no  acute distress HEENT: Head is normocephalic. Extraocular movements are intact. Oropharynx is clear. Neck: Neck is supple, no palpable cervical or supraclavicular lymphadenopathy. Heart: Regular in rate and rhythm with no murmurs, rubs, or gallops. Chest: Clear to auscultation bilaterally, with no rhonchi, wheezes, or rales. Abdomen: Soft, nontender, nondistended, with no rigidity or guarding. Extremities: No cyanosis or edema. Lymphatics: see Neck Exam Skin: No concerning lesions. Musculoskeletal: symmetric strength and muscle tone throughout. Neurologic: Cranial nerves II through XII are grossly intact. No obvious focalities. Speech is fluent. Coordination is intact. Psychiatric:  Judgment and insight are intact. Affect is appropriate.   ECOG = 2  0 - Asymptomatic (Fully active, able to carry on all predisease activities without restriction)  1 - Symptomatic but completely ambulatory (Restricted in physically strenuous activity but ambulatory and able to carry out work of a light or sedentary nature. For example, light housework, office work)  2 - Symptomatic, <50% in bed during the day (Ambulatory and capable of all self care but unable to carry out any work activities. Up and about more than 50% of waking hours)  3 - Symptomatic, >50% in bed, but not bedbound (Capable of only limited self-care, confined to bed or chair 50% or more of waking hours)  4 - Bedbound (Completely disabled. Cannot carry on any self-care. Totally confined to bed or chair)  5 - Death   Eustace Pen MM, Creech RH, Tormey DC, et al. [phone]). "Toxicity and response criteria of the [facility]". [place] Oncol. 5 (6): 649-55  LABORATORY DATA:  Lab Results  Component Value Date   WBC 12.2 (H) 01/30/2019   HGB 9.7 (L) 01/30/2019   HCT 31.9 (L) 01/30/2019   MCV 84.2 01/30/2019   PLT 221 01/30/2019   NEUTROABS 7.9 (H) 01/30/2019   Lab Results  Component Value Date   NA 141 01/30/2019   K 4.0 01/30/2019   CL 111 01/30/2019   CO2 21 (L) 01/30/2019   GLUCOSE 212 (H) 01/30/2019   CREATININE 1.33 (H) 01/30/2019   CALCIUM 8.6 (L) 01/30/2019      RADIOGRAPHY: No results found.    IMPRESSION: (T1c, N2, M0) small cell lung cancer (limited stage) presented with right upper lobe lung nodule in addition to mediastinal lymphadenopathy diagnosed in February 2020  Patient would be a good candidate for a curative course of radiation therapy, along with chemotherapy. The pt has already received one cycle of chemotherapy and seemed to tolerate this well.   Today, I talked to the patient and about the findings and work-up thus far.  We discussed the natural history of her disease  and general treatment, highlighting the role of radiotherapy in the management.  We discussed the available radiation techniques, and focused on the details of logistics and delivery.  We reviewed the anticipated acute and late sequelae associated with radiation in this setting.  The patient was encouraged to ask questions that I answered to the best of my ability.  A patient consent form was discussed and signed.  We retained a copy for our records.  The patient would like to proceed with radiation and will be scheduled for CT simulation.   PLAN:She will return on March 30th at [facility] for [facility]. Treatments to begin concomitant with her second cycle of chemotherapy.  Anticipate 6 weeks of radiation therapy directed at the central right chest area. We will also need to consider discussion of prophylactic cranial radiation at a later date, but this may be somewhat  challenging for this patient with her prior history of CVAs,  vascular issues, diabetes mellitus and bipolar disorder.   ------------------------------------------------  Blair Promise, PhD, MD      This document serves as a record of services personally performed by Gery Pray, MD. It was created on his behalf by Mary-Margaret Loma Messing, a trained medical scribe. The creation of this record is based on the scribe's personal observations and the provider's statements to them. This document has been checked and approved by the attending provider.

## 2019-02-01 NOTE — Patient Instructions (Signed)
Coronavirus (COVID-19) Are you at risk?  Are you at risk for the Coronavirus (COVID-19)?  To be considered HIGH RISK for Coronavirus (COVID-19), you have to meet the following criteria:  . Traveled to [place], [place], [place], [place] or [place]; or in the [place] to [place], [place], [place], or [place]; and have fever, cough, and shortness of breath within the last 2 weeks of travel OR . Been in close contact with a person diagnosed with COVID-19 within the last 2 weeks and have fever, cough, and shortness of breath . IF YOU DO NOT MEET THESE CRITERIA, YOU ARE CONSIDERED LOW RISK FOR COVID-19.  What to do if you are HIGH RISK for COVID-19?  . If you are having a medical emergency, call 911. . Seek medical care right away. Before you go to a doctor's office, urgent care or emergency department, call ahead and tell them about your recent travel, contact with someone diagnosed with COVID-19, and your symptoms. You should receive instructions from your physician's office regarding next steps of care.  . When you arrive at healthcare provider, tell the healthcare staff immediately you have returned from visiting [place], [place], [place], [place] or [place]; or traveled in the [place] to [place], [place], [place], or [place]; in the last two weeks or you have been in close contact with a person diagnosed with COVID-19 in the last 2 weeks.   . Tell the health care staff about your symptoms: fever, cough and shortness of breath. . After you have been seen by a medical provider, you will be either: o Tested for (COVID-19) and discharged home on quarantine except to seek medical care if symptoms worsen, and asked to  - Stay home and avoid contact with others until you get your results (4-5 days)  - Avoid travel on public transportation if possible (such as bus, train, or airplane) or o Sent to the Emergency Department by EMS for evaluation, COVID-19 testing, and possible  admission depending on your condition and test results.  What to do if you are LOW RISK for COVID-19?  Reduce your risk of any infection by using the same precautions used for avoiding the common cold or flu:  . Wash your hands often with soap and warm water for at least 20 seconds.  If soap and water are not readily available, use an alcohol-based hand sanitizer with at least 60% alcohol.  . If coughing or sneezing, cover your mouth and nose by coughing or sneezing into the elbow areas of your shirt or coat, into a tissue or into your sleeve (not your hands). . Avoid shaking hands with others and consider head nods or verbal greetings only. . Avoid touching your eyes, nose, or mouth with unwashed hands.  . Avoid close contact with people who are sick. . Avoid places or events with large numbers of people in one location, like concerts or sporting events. . Carefully consider travel plans you have or are making. . If you are planning any travel outside or inside the US, visit the CDC's Travelers' Health webpage for the latest health notices. . If you have some symptoms but not all symptoms, continue to monitor at home and seek medical attention if your symptoms worsen. . If you are having a medical emergency, call 911.   ADDITIONAL HEALTHCARE OPTIONS FOR PATIENTS  [facility] / e-Visit: [email or web address]         [facility]: [phone]  [facility]: [phone]                   [facility]: [phone]   

## 2019-02-02 ENCOUNTER — Ambulatory Visit: Payer: HMO | Admitting: Adult Health

## 2019-02-02 NOTE — Telephone Encounter (Signed)
LMOM for a return call.  

## 2019-02-02 NOTE — Telephone Encounter (Signed)
Patient is returning a call from the nurse.  Tried to reach office but phone was busy.  Please call patient back at [phone]

## 2019-02-03 ENCOUNTER — Other Ambulatory Visit: Payer: Self-pay | Admitting: *Deleted

## 2019-02-03 ENCOUNTER — Encounter: Payer: Self-pay | Admitting: General Practice

## 2019-02-03 DIAGNOSIS — E1142 Type 2 diabetes mellitus with diabetic polyneuropathy: Secondary | ICD-10-CM

## 2019-02-03 DIAGNOSIS — I10 Essential (primary) hypertension: Secondary | ICD-10-CM

## 2019-02-03 DIAGNOSIS — F3175 Bipolar disorder, in partial remission, most recent episode depressed: Secondary | ICD-10-CM

## 2019-02-03 DIAGNOSIS — C3411 Malignant neoplasm of upper lobe, right bronchus or lung: Secondary | ICD-10-CM

## 2019-02-03 DIAGNOSIS — E785 Hyperlipidemia, unspecified: Secondary | ICD-10-CM

## 2019-02-03 DIAGNOSIS — I63311 Cerebral infarction due to thrombosis of right middle cerebral artery: Secondary | ICD-10-CM

## 2019-02-03 NOTE — Patient Outreach (Addendum)
  [facility]) Care Management Chronic Special Needs Program  02/03/2019  Name: Tara Hopkins DOB: 09-09-1951  MRN: [id number]  Tara Hopkins is enrolled in a chronic special needs plan for  Diabetes. A completed health risk assessment has not been received from the client and client has not responded to outreach attempts by their health care concierge.  The client's individualized care plan was developed based on available data.  Plan:  . Send unsuccessful outreach letter with a copy of individualized care plan to client . Send client educational material on: self management of HTN; fall prevention; prevention of another stroke . Refer client to [facility] Management pharmacist related to polypharmacy  . Send individualized care plan to provider  Chronic care management coordinator, Tara Hopkins, will attempt outreach in 2-4 months.   Barrington Ellison RN,CCM,CDE [facility] Management Coordinator Office Phone [phone] Office Fax [phone]

## 2019-02-03 NOTE — Progress Notes (Signed)
[facility] Psychosocial Distress Screening Clinical Social Work  Clinical Social Work was referred by distress screening protocol.  The patient scored a 5 on the Psychosocial Distress Thermometer which indicates moderate distress. Clinical Social Worker contacted patient by phone to assess for distress and other psychosocial needs. Left VM w information about [facility], how to contact and encouragement to reach out if needs arise.  CSW Wallis Bamberg is already connected w this patient and has been in contact as well.    ON[facility] DISTRESS SCREENING 02/01/2019  Screening Type Initial Screening  Distress experienced in past week (1-10) 5  Practical problem type Transportation  Emotional problem type Nervousness/Anxiety  Physical Problem type Getting around    Clinical Social Worker follow up needed: No.  If yes, follow up plan:  Beverely Pace, [place], LCSW Clinical Social Worker Phone:  [phone]

## 2019-02-03 NOTE — Addendum Note (Signed)
Addended by: Barrington Ellison on: 02/03/2019 02:24 PM   Modules accepted: Orders

## 2019-02-06 ENCOUNTER — Ambulatory Visit: Payer: HMO | Admitting: Radiation Oncology

## 2019-02-07 ENCOUNTER — Other Ambulatory Visit: Payer: Self-pay

## 2019-02-07 ENCOUNTER — Inpatient Hospital Stay: Payer: HMO

## 2019-02-07 ENCOUNTER — Telehealth: Payer: Self-pay | Admitting: *Deleted

## 2019-02-07 ENCOUNTER — Inpatient Hospital Stay (HOSPITAL_BASED_OUTPATIENT_CLINIC_OR_DEPARTMENT_OTHER): Payer: HMO | Admitting: Physician Assistant

## 2019-02-07 ENCOUNTER — Ambulatory Visit: Payer: HMO | Admitting: Adult Health

## 2019-02-07 ENCOUNTER — Telehealth: Payer: Self-pay

## 2019-02-07 ENCOUNTER — Other Ambulatory Visit: Payer: Self-pay | Admitting: Pharmacist

## 2019-02-07 VITALS — BP 185/93 | HR 71 | Temp 98.5°F | Resp 18 | Ht 65.0 in | Wt 160.0 lb

## 2019-02-07 DIAGNOSIS — F419 Anxiety disorder, unspecified: Secondary | ICD-10-CM | POA: Diagnosis not present

## 2019-02-07 DIAGNOSIS — Z794 Long term (current) use of insulin: Secondary | ICD-10-CM | POA: Diagnosis not present

## 2019-02-07 DIAGNOSIS — E1140 Type 2 diabetes mellitus with diabetic neuropathy, unspecified: Secondary | ICD-10-CM

## 2019-02-07 DIAGNOSIS — C3411 Malignant neoplasm of upper lobe, right bronchus or lung: Secondary | ICD-10-CM

## 2019-02-07 DIAGNOSIS — Z5111 Encounter for antineoplastic chemotherapy: Secondary | ICD-10-CM

## 2019-02-07 DIAGNOSIS — I69354 Hemiplegia and hemiparesis following cerebral infarction affecting left non-dominant side: Secondary | ICD-10-CM

## 2019-02-07 DIAGNOSIS — I10 Essential (primary) hypertension: Secondary | ICD-10-CM

## 2019-02-07 DIAGNOSIS — Z7982 Long term (current) use of aspirin: Secondary | ICD-10-CM | POA: Diagnosis not present

## 2019-02-07 DIAGNOSIS — E7800 Pure hypercholesterolemia, unspecified: Secondary | ICD-10-CM

## 2019-02-07 DIAGNOSIS — Z79899 Other long term (current) drug therapy: Secondary | ICD-10-CM | POA: Diagnosis not present

## 2019-02-07 DIAGNOSIS — C3412 Malignant neoplasm of upper lobe, left bronchus or lung: Secondary | ICD-10-CM

## 2019-02-07 DIAGNOSIS — I63311 Cerebral infarction due to thrombosis of right middle cerebral artery: Secondary | ICD-10-CM

## 2019-02-07 DIAGNOSIS — F319 Bipolar disorder, unspecified: Secondary | ICD-10-CM

## 2019-02-07 LAB — CBC WITH DIFFERENTIAL (CANCER CENTER ONLY)
Abs Immature Granulocytes: 0.13 10*3/uL — ABNORMAL HIGH (ref 0.00–0.07)
Basophils Absolute: 0 10*3/uL (ref 0.0–0.1)
Basophils Relative: 0 %
EOS ABS: 0.1 10*3/uL (ref 0.0–0.5)
Eosinophils Relative: 1 %
HCT: 34.2 % — ABNORMAL LOW (ref 36.0–46.0)
Hemoglobin: 10.4 g/dL — ABNORMAL LOW (ref 12.0–15.0)
Immature Granulocytes: 1 %
Lymphocytes Relative: 16 %
Lymphs Abs: 2.1 10*3/uL (ref 0.7–4.0)
MCH: 26 pg (ref 26.0–34.0)
MCHC: 30.4 g/dL (ref 30.0–36.0)
MCV: 85.5 fL (ref 80.0–100.0)
Monocytes Absolute: 0.5 10*3/uL (ref 0.1–1.0)
Monocytes Relative: 4 %
NEUTROS PCT: 78 %
Neutro Abs: 10.3 10*3/uL — ABNORMAL HIGH (ref 1.7–7.7)
Platelet Count: 198 10*3/uL (ref 150–400)
RBC: 4 MIL/uL (ref 3.87–5.11)
RDW: 18.6 % — ABNORMAL HIGH (ref 11.5–15.5)
WBC Count: 13.1 10*3/uL — ABNORMAL HIGH (ref 4.0–10.5)
nRBC: 0 % (ref 0.0–0.2)

## 2019-02-07 LAB — CMP (CANCER CENTER ONLY)
ALT: 16 U/L (ref 0–44)
AST: 12 U/L — ABNORMAL LOW (ref 15–41)
Albumin: 3 g/dL — ABNORMAL LOW (ref 3.5–5.0)
Alkaline Phosphatase: 133 U/L — ABNORMAL HIGH (ref 38–126)
Anion gap: 9 (ref 5–15)
BILIRUBIN TOTAL: 0.3 mg/dL (ref 0.3–1.2)
BUN: 11 mg/dL (ref 8–23)
CO2: 26 mmol/L (ref 22–32)
Calcium: 8.3 mg/dL — ABNORMAL LOW (ref 8.9–10.3)
Chloride: 107 mmol/L (ref 98–111)
Creatinine: 1.4 mg/dL — ABNORMAL HIGH (ref 0.44–1.00)
GFR, EST NON AFRICAN AMERICAN: 39 mL/min — AB (ref >60)
GFR, Est AFR Am: 45 mL/min — ABNORMAL LOW (ref >60)
Glucose, Bld: 237 mg/dL — ABNORMAL HIGH (ref 70–99)
Potassium: 3.3 mmol/L — ABNORMAL LOW (ref 3.5–5.1)
Sodium: 142 mmol/L (ref 135–145)
Total Protein: 7 g/dL (ref 6.5–8.1)

## 2019-02-07 MED ORDER — SODIUM CHLORIDE 0.9 % IV SOLN
100.0000 mg/m2 | Freq: Once | INTRAVENOUS | Status: AC
  Administered 2019-02-07: 180 mg via INTRAVENOUS
  Filled 2019-02-07: qty 9

## 2019-02-07 MED ORDER — DEXAMETHASONE SODIUM PHOSPHATE 10 MG/ML IJ SOLN
INTRAMUSCULAR | Status: AC
  Filled 2019-02-07: qty 1

## 2019-02-07 MED ORDER — DEXAMETHASONE SODIUM PHOSPHATE 10 MG/ML IJ SOLN: 10.0000 mg | Freq: Once | INTRAMUSCULAR | Status: DC

## 2019-02-07 MED ORDER — SODIUM CHLORIDE 0.9 % IV SOLN
347.5000 mg | Freq: Once | INTRAVENOUS | Status: AC
  Administered 2019-02-07: 350 mg via INTRAVENOUS
  Filled 2019-02-07: qty 35

## 2019-02-07 MED ORDER — PALONOSETRON HCL INJECTION 0.25 MG/5ML
INTRAVENOUS | Status: AC
  Filled 2019-02-07: qty 5

## 2019-02-07 MED ORDER — CLONIDINE HCL 0.1 MG PO TABS
ORAL_TABLET | ORAL | Status: AC
  Filled 2019-02-07: qty 2

## 2019-02-07 MED ORDER — SODIUM CHLORIDE 0.9 % IV SOLN
Freq: Once | INTRAVENOUS | Status: AC
  Administered 2019-02-07: 10:00:00 via INTRAVENOUS
  Filled 2019-02-07: qty 250

## 2019-02-07 MED ORDER — SODIUM CHLORIDE 0.9 % IV SOLN
Freq: Once | INTRAVENOUS | Status: AC
  Administered 2019-02-07: 11:00:00 via INTRAVENOUS
  Filled 2019-02-07: qty 5

## 2019-02-07 MED ORDER — CLONIDINE HCL 0.1 MG PO TABS
0.2000 mg | ORAL_TABLET | Freq: Once | ORAL | Status: AC
  Administered 2019-02-07: 0.2 mg via ORAL

## 2019-02-07 MED ORDER — PALONOSETRON HCL INJECTION 0.25 MG/5ML
0.2500 mg | Freq: Once | INTRAVENOUS | Status: AC
  Administered 2019-02-07: 0.25 mg via INTRAVENOUS

## 2019-02-07 NOTE — Progress Notes (Signed)
Ok to treat with blood pressure reading today.  Patient failed to take am dose of blood pressure medication.  While here please give one time dose of Clonidine.

## 2019-02-07 NOTE — Progress Notes (Signed)
Added Emend + Dex 12 IV- regimen is highly emetogenic (Carbo AUC > 4; per NCCN).  Ok'd by Julien Nordmann.  Pt did have CINV w/ 1st cycle. Kennith Center, Pharm.D., CPP 02/07/2019@10 :26 AM

## 2019-02-07 NOTE — Patient Instructions (Signed)
Coronavirus (COVID-19) Are you at risk?  Are you at risk for the Coronavirus (COVID-19)?  To be considered HIGH RISK for Coronavirus (COVID-19), you have to meet the following criteria:  . Traveled to [place], [place], [place], [place] or [place]; or in the [place] to [place], [place], [place], or [place]; and have fever, cough, and shortness of breath within the last 2 weeks of travel OR . Been in close contact with a person diagnosed with COVID-19 within the last 2 weeks and have fever, cough, and shortness of breath . IF YOU DO NOT MEET THESE CRITERIA, YOU ARE CONSIDERED LOW RISK FOR COVID-19.  What to do if you are HIGH RISK for COVID-19?  . If you are having a medical emergency, call 911. . Seek medical care right away. Before you go to a doctor's office, urgent care or emergency department, call ahead and tell them about your recent travel, contact with someone diagnosed with COVID-19, and your symptoms. You should receive instructions from your physician's office regarding next steps of care.  . When you arrive at healthcare provider, tell the healthcare staff immediately you have returned from visiting [place], [place], [place], [place] or [place]; or traveled in the [place] to [place], [place], [place], or [place]; in the last two weeks or you have been in close contact with a person diagnosed with COVID-19 in the last 2 weeks.   . Tell the health care staff about your symptoms: fever, cough and shortness of breath. . After you have been seen by a medical provider, you will be either: o Tested for (COVID-19) and discharged home on quarantine except to seek medical care if symptoms worsen, and asked to  - Stay home and avoid contact with others until you get your results (4-5 days)  - Avoid travel on public transportation if possible (such as bus, train, or airplane) or o Sent to the Emergency Department by EMS for evaluation, COVID-19 testing, and possible  admission depending on your condition and test results.  What to do if you are LOW RISK for COVID-19?  Reduce your risk of any infection by using the same precautions used for avoiding the common cold or flu:  . Wash your hands often with soap and warm water for at least 20 seconds.  If soap and water are not readily available, use an alcohol-based hand sanitizer with at least 60% alcohol.  . If coughing or sneezing, cover your mouth and nose by coughing or sneezing into the elbow areas of your shirt or coat, into a tissue or into your sleeve (not your hands). . Avoid shaking hands with others and consider head nods or verbal greetings only. . Avoid touching your eyes, nose, or mouth with unwashed hands.  . Avoid close contact with people who are sick. . Avoid places or events with large numbers of people in one location, like concerts or sporting events. . Carefully consider travel plans you have or are making. . If you are planning any travel outside or inside the [place], visit the CDC's Travelers' Health webpage for the latest health notices. . If you have some symptoms but not all symptoms, continue to monitor at home and seek medical attention if your symptoms worsen. . If you are having a medical emergency, call 911.  ADDITIONAL HEALTHCARE OPTIONS FOR PATIENTS  [facility] Tele[facility] / e-Visit: [email or web address]         [facility]: [phone]  [facility]: [facility]: [facility] Discharge Instructions for Patients Receiving Chemotherapy  Today you received the following chemotherapy agents: Etoposide (Vepesid, VP-16) and Carboplatin (Paraplatin)  To help prevent nausea and vomiting after your treatment, we encourage you to take your nausea medication as directed.     If you develop nausea and vomiting that is not controlled by your  nausea medication, call the clinic.   BELOW ARE SYMPTOMS THAT SHOULD BE REPORTED IMMEDIATELY:  *FEVER GREATER THAN 100.5 F  *CHILLS WITH OR WITHOUT FEVER  NAUSEA AND VOMITING THAT IS NOT CONTROLLED WITH YOUR NAUSEA MEDICATION  *UNUSUAL SHORTNESS OF BREATH  *UNUSUAL BRUISING OR BLEEDING  TENDERNESS IN MOUTH AND THROAT WITH OR WITHOUT PRESENCE OF ULCERS  *URINARY PROBLEMS  *BOWEL PROBLEMS  UNUSUAL RASH Items with * indicate a potential emergency and should be followed up as soon as possible.  Feel free to call the clinic should you have any questions or concerns. The clinic phone number is [phone].  Please show the [facility] at check-in to the Emergency Department and triage nurse.

## 2019-02-07 NOTE — Telephone Encounter (Signed)
Faxed DME order for [employer] to [employer] [phone]

## 2019-02-07 NOTE — Progress Notes (Signed)
[facility] OFFICE PROGRESS NOTE  Tara Peng, NP [place] [address]  DIAGNOSIS:  Limited stage (T1c, N2, M0) small cell lung cancer presented with left upper lobe lung nodule in addition to mediastinal lymphadenopathy diagnosed in February 2020.  PRIOR THERAPY: None  CURRENT THERAPY: Systemic chemotherapy with carboplatin for AUC of 5 on day 1 and etoposide at 100 mg/M2 on days 1, 2 and 3 with Neulasta support every 3 weeks. First dose January 16, 2019. Status post cycle #1.   INTERVAL HISTORY: Tara Hopkins 68 y.o. female returns to the clinic for a follow-up visit. The patient is feeling well today without any concerning complaints. Since her last visit, she has seen her PCP for management of her blood pressure. Unfortunately she was not able to take her blood pressure medications this morning due to her early appointment. Additionally, approximately 4 days ago, she states she fell at home trying to transfer out of her wheel chair. She did not sustain any injuries or hit her head. Her temperature was checked after her fall which was 101 degrees F. The patient denied any sick contacts or any symptoms of infection. She denied chills, night sweats, sore throat, abdominal pain, dysuria, hemoptyosis, shortness of breath, chest pain, cough, or nasal congestion. Her temperature resolved with tylenol and she has not had any fevers since. She has felt well since that time.  She tolerated her first treatment well except for some nausea, vomiting, and diarrhea. She takes compazine nausea. She has not taken anything for management of the diarrhea. She denies any associated melena, weight loss, appetite change, or hematochezia. She also experienced myalgias after her Neulasta injection.  She denies any chest pain, shortness of breath, cough, or hemoptysis.  She denies any current nausea, vomiting, diarrhea, or constipation.  She denies any headache or visual changes.  She is here today for evaluation prior to starting cycle #2 of her treatment.     MEDICAL HISTORY: Past Medical History:  Diagnosis Date  . Anemia   . Anxiety   . Bipolar 1 disorder ([place])   . Cataracts, bilateral   . Depression   . Diabetes mellitus without complication ([place])   . Glaucoma   . History of blood transfusion   . History of hyperbaric oxygen therapy    pt reports 80 treatments.  . Hypercholesteremia   . Hypertension   . Mediastinal adenopathy   . Peripheral neuropathy   . Pneumonia   . Stroke ([place]) 02/2015  . Wears glasses     ALLERGIES:  has No Known Allergies.  MEDICATIONS:  Current Outpatient Medications  Medication Sig Dispense Refill  . amLODipine (NORVASC) 5 MG tablet Take 1 tablet (5 mg total) by mouth daily for 30 days. 30 tablet 0  . aspirin 325 MG EC tablet Take 1 tablet (325 mg total) by mouth daily. 30 tablet 0  . aspirin EC 81 MG tablet Take 81 mg by mouth daily.    Marland Kitchen atorvastatin (LIPITOR) 40 MG tablet TAKE ONE TABLET BY MOUTH DAILY AT 6 PM 90 tablet 0  . citalopram (CELEXA) 10 MG tablet Take 1 tablet (10 mg total) by mouth daily. 90 tablet 1  . clonazePAM (KLONOPIN) 0.5 MG tablet Take 1 tablet (0.5 mg total) by mouth 3 (three) times daily. 90 tablet 1  . gabapentin (NEURONTIN) 400 MG capsule TAKE 3 CAPSULES BY MOUTH THREE TIMES A DAY (Patient taking differently: Take 1,200 mg by mouth 3 (three) times daily. TAKE  3 CAPSULES BY MOUTH THREE TIMES A DAY) 270 capsule 2  . glimepiride (AMARYL) 4 MG tablet Take 1 tablet (4 mg total) by mouth daily with breakfast. 90 tablet 0  . glucose blood (ONETOUCH VERIO) test strip USE TO TEST BLOOD GLUCOSE 3 TIMES DAILY 100 each 12  . Insulin Glargine (LANTUS SOLOSTAR) 100 UNIT/ML Solostar Pen Inject 25 Units into the skin at bedtime. (Patient taking differently: Inject 40 Units into the skin at bedtime. ) 15 mL 6  . Insulin Pen Needle (B-D UF III MINI PEN NEEDLES) 31G X 5 MM MISC Use as directed. 100 each 3  .  latanoprost (XALATAN) 0.005 % ophthalmic solution INSTILL 1 DROP INTO BOTH EYES EVERY EVENING (Patient taking differently: Place 1 drop into both eyes at bedtime. ) 7.5 mL 0  . lisinopril (PRINIVIL,ZESTRIL) 40 MG tablet Take 1 tablet (40 mg total) by mouth daily. 90 tablet 1  . ONE TOUCH LANCETS MISC USE TO CHECK BLOOD SUGAR 3 TIMES DAILY 200 each 3  . prochlorperazine (COMPAZINE) 10 MG tablet TAKE ONE TABLET BY MOUTH EVERY 6 HOURS AS NEEDED FOR NAUSEA OR VOMITING 30 tablet 0  . traMADol (ULTRAM) 50 MG tablet Take 1 tablet (50 mg total) by mouth every 12 (twelve) hours as needed for up to 30 days. 60 tablet 1   No current facility-administered medications for this visit.    Facility-Administered Medications Ordered in Other Visits  Medication Dose Route Frequency Provider Last Rate Last Dose  . CARBOplatin (PARAPLATIN) 350 mg in sodium chloride 0.9 % 250 mL chemo infusion  350 mg Intravenous Once Curt Bears, MD 570 mL/hr at 02/07/19 1149 350 mg at 02/07/19 1149  . etoposide (VEPESID) 180 mg in sodium chloride 0.9 % 500 mL chemo infusion  100 mg/m2 (Treatment Plan Recorded) Intravenous Once Curt Bears, MD        SURGICAL HISTORY:  Past Surgical History:  Procedure Laterality Date  . AMPUTATION Right 12/15/2013   Procedure: Right Transmetatarsal Amputation;  Surgeon: Newt Minion, MD;  Location: [facility];  Service: Orthopedics;  Laterality: Right;  . AMPUTATION Right 02/23/2014   Procedure: AMPUTATION FOOT;  Surgeon: Newt Minion, MD;  Location: [facility];  Service: Orthopedics;  Laterality: Right;  Right Foot Revision Transmetatarsal Amputation  . CATARACT EXTRACTION W/ INTRAOCULAR LENS  IMPLANT, BILATERAL    . COLONOSCOPY W/ BIOPSIES AND POLYPECTOMY    . I&D EXTREMITY Right 05/23/2013   Procedure: IRRIGATION AND DEBRIDEMENT RIGHT GREAT TOE;  Surgeon: Mauri Pole, MD;  Location: [facility];  Service: Orthopedics;  Laterality: Right;  . TUBAL LIGATION    . VIDEO BRONCHOSCOPY WITH  ENDOBRONCHIAL ULTRASOUND N/A 12/22/2018   Procedure: VIDEO BRONCHOSCOPY WITH ENDOBRONCHIAL ULTRASOUND;  Surgeon: [name], DO;  Location: [facility];  Service: Thoracic;  Laterality: N/A;    REVIEW OF SYSTEMS:   Review of Systems  Constitutional: Positive for one time fever 4 days ago. Negative for appetite change, chills, fatigue, fever and unexpected weight change.  HENT:   Negative for mouth sores, nosebleeds, sore throat and trouble swallowing.   Eyes: Negative for eye problems and icterus.  Respiratory: Negative for cough, hemoptysis, shortness of breath and wheezing.   Cardiovascular: Negative for chest pain and leg swelling.  Gastrointestinal: Negative for any current abdominal pain, constipation, diarrhea, nausea and vomiting.  Genitourinary: Negative for bladder incontinence, difficulty urinating, dysuria, frequency and hematuria.   Musculoskeletal: Negative for back pain, neck pain and neck stiffness.  Skin: Negative for itching  and rash.  Neurological: Positive for left extremity weakness secondary to her history of CVA. Positive for neuropathy. Negative for dizziness, gait problem, headaches, light-headedness and seizures.  Hematological: Negative for adenopathy. Does not bruise/bleed easily.  Psychiatric/Behavioral: Negative for confusion, depression and sleep disturbance. The patient is not nervous/anxious.     PHYSICAL EXAMINATION:  Blood pressure (!) 185/93, pulse 71, temperature 98.5 F (36.9 C), temperature source Oral, resp. rate 18, height 5\' 5"  (1.651 m), weight 160 lb (72.6 kg), SpO2 98 %.  ECOG PERFORMANCE STATUS: 2 - Symptomatic, <50% confined to bed  Physical Exam  Constitutional: Oriented to person, place, and time and well-developed, well-nourished, and in no distress. No distress.  HENT:  Head: Normocephalic and atraumatic.  Mouth/Throat: Oropharynx is clear and moist. No oropharyngeal exudate.  Eyes: Conjunctivae are normal. Right eye exhibits no  discharge. Left eye exhibits no discharge. No scleral icterus.  Neck: Normal range of motion. Neck supple.  Cardiovascular: Normal rate, regular rhythm, normal heart sounds and intact distal pulses.   Pulmonary/Chest: Effort normal and breath sounds normal. No respiratory distress. No wheezes. No rales.  Abdominal: Soft. Bowel sounds are normal. Exhibits no distension and no mass. There is no tenderness.  Musculoskeletal: Normal range of motion. Exhibits no edema.  Lymphadenopathy:    No cervical adenopathy.  Neurological: Alert and oriented to person, place, and time. Exhibits normal muscle tone. Gait normal. Coordination normal.  Skin: Skin is warm and dry. No rash noted. Not diaphoretic. No erythema. No pallor.  Psychiatric: Mood, memory and judgment normal.  Vitals reviewed.  LABORATORY DATA: Lab Results  Component Value Date   WBC 13.1 (H) 02/07/2019   HGB 10.4 (L) 02/07/2019   HCT 34.2 (L) 02/07/2019   MCV 85.5 02/07/2019   PLT 198 02/07/2019      Chemistry      Component Value Date/Time   NA 142 02/07/2019 0834   K 3.3 (L) 02/07/2019 0834   CL 107 02/07/2019 0834   CO2 26 02/07/2019 0834   BUN 11 02/07/2019 0834   CREATININE 1.40 (H) 02/07/2019 0834      Component Value Date/Time   CALCIUM 8.3 (L) 02/07/2019 0834   ALKPHOS 133 (H) 02/07/2019 0834   AST 12 (L) 02/07/2019 0834   ALT 16 02/07/2019 0834   BILITOT 0.3 02/07/2019 0834       RADIOGRAPHIC STUDIES:  No results found.   ASSESSMENT/PLAN:  This is a very pleasant 68 year old African-American female recently diagnosed with limited stage (T1c, N2, M0) small cell lung cancer who presented with a right upper lobe lung nodule in addition to mediastinal lymphadenopathy.  She was diagnosed in February 2020. She is currently undergoing chemotherapy with carboplatin for an AUC of 5 on day 1 and etoposide 100 mg/m on days 1, 2, and 3 with Neulasta support every 3 weeks.  She is status post 1 cycle. She missed day  3 of etoposide of cycle 1 due to difficulty obtaining transportation. She had a meeting with radiation oncology on 02/01/2019 for consultation. The plan is to receive 6 weeks of radiation to the central right chest area.  The patient was seen with Dr. Julien Nordmann today.  The patient tolerated her treatment fairly well except for  nausea, vomiting, and diarrhea she manages with Compazine. The patient's blood pressure was elevated today at 185/93. The patient did not take her blood pressure medication this morning. She was advised to take her medication when she returns home and the importance of taking her  medication before her appointments. She will receive a one time dose of clonidine 0.2 mg today prior to her treatment.  We recommend she proceed with cycle #2 today as scheduled.  I will arrange for a restaging chest CT scan to be performed prior to her next visit.  I will see her back for a follow-up visit in 3 weeks for evaluation and to review her scan results before starting cycle #3.   She was advised to take imodium over the counter for diarrhea. She also was encouraged to take Claritin for her symptoms secondary to the neulasta injection.   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 She was encouraged to monitor her blood sugars closely at home and to take her medication as prescribed.   The patient has a history significant for peripheral neuropathy, left sided weakness due to her history of CVA, and a right transmetatarsal amputation. She lives alone at home and requires a wheel chair for mobility. She had a fall approximately 4 days ago secondary to trouble transferring. Considering the patient has multiple disabling comorbidities and lives alone, she would benefit from a motorized wheelchair. I have sent a request for an electric wheelchair.   Regarding the patient's one time fever at 52 F, the patient was advised to monitor her  temperature closely at home. Should she develop any signs of fever or infection, to please seek medical evaluation. She has our number and the patient was advised to call immediately if she has any concerning symptoms in the interval. The patient voices understanding of current disease status and treatment options and is in agreement with the current care plan. All questions were answered. The patient knows to call the clinic with any problems, questions or concerns. We can certainly see the patient much sooner if necessary   Orders Placed This Encounter  Procedures  . DME Wheelchair electric    Patient has a significant history for multiple disabling comorbidties including peripheral neuropathy, left sided weakness secondary to a stroke, and an amputation of the right transmetatarsal. She lives alone and has trouble managing her non-electric wheelchair independently. The patient would benefit from an electric wheelchair.  . CT CHEST W CONTRAST    Standing Status:   Future    Standing Expiration Date:   02/07/2020    Order Specific Question:   ** REASON FOR EXAM (FREE TEXT)    Answer:   Restaging Lung Cancer    Order Specific Question:   If indicated for the ordered procedure, I authorize the administration of contrast media per Radiology protocol    Answer:   Yes    Order Specific Question:   Preferred imaging location?    Answer:   [facility]    Order Specific Question:   Radiology Contrast Protocol - do NOT remove file path    Answer:   [email or web address]     Cassandra L Heilingoetter, PA-C 02/07/19  ADDENDUM: Hematology/Oncology Attending: I had a face-to-face encounter with the patient today.  I recommended her care plan.  This is a very pleasant 68 years old African-American female with limited stage small cell lung cancer and currently undergoing systemic chemotherapy with carboplatin and etoposide status post 1 cycle.  The patient tolerated her  treatment well with no concerning complaints except for the aching pain and fatigue after the Neulasta injection. She is feeling very well today and her lab parameters are good for the treatment. I  recommended for her to proceed with cycle #2 today as scheduled. I will see her back for follow-up visit in 3 weeks for evaluation with repeat CT scan of the chest for restaging of her disease. The patient was advised to call immediately if she has any concerning symptoms in the interval. Disclaimer: This note was dictated with voice recognition software. Similar sounding words can inadvertently be transcribed and may be missed upon review. Eilleen Kempf, MD 02/07/19

## 2019-02-07 NOTE — Telephone Encounter (Signed)
Nutrition  RD working remotely.  RD called patient this pm for nutrition follow-up.  Patient did not answer and mailbox is full so unable to leave call back number.    Square Jowett B. Zenia Resides, [place], [place] Registered Dietitian [phone] (pager)

## 2019-02-07 NOTE — Patient Outreach (Signed)
[facility]) Care Management  [place]   02/07/2019  Shawntel Farnworth 02-28-1951 [id number]  Reason for referral: Medication Review  Referral source: [place] Management RN with [employer] Current insurance: [employer]   Outreach:  Unsuccessful telephone call attempt #1 to patient. Unable to leave message as mail box is full.   Plan:  I will make another outreach attempt to patient within 3-4 business days   Ralene Bathe, PharmD, [place] [phone]

## 2019-02-08 ENCOUNTER — Other Ambulatory Visit: Payer: Self-pay

## 2019-02-08 ENCOUNTER — Encounter: Payer: Self-pay | Admitting: General Practice

## 2019-02-08 ENCOUNTER — Ambulatory Visit: Payer: HMO | Admitting: Podiatry

## 2019-02-08 ENCOUNTER — Inpatient Hospital Stay: Payer: HMO | Attending: Internal Medicine

## 2019-02-08 VITALS — BP 157/84 | HR 71 | Temp 97.8°F | Resp 18

## 2019-02-08 DIAGNOSIS — Z794 Long term (current) use of insulin: Secondary | ICD-10-CM | POA: Diagnosis not present

## 2019-02-08 DIAGNOSIS — F419 Anxiety disorder, unspecified: Secondary | ICD-10-CM | POA: Insufficient documentation

## 2019-02-08 DIAGNOSIS — Z5111 Encounter for antineoplastic chemotherapy: Secondary | ICD-10-CM | POA: Diagnosis not present

## 2019-02-08 DIAGNOSIS — C3412 Malignant neoplasm of upper lobe, left bronchus or lung: Secondary | ICD-10-CM | POA: Insufficient documentation

## 2019-02-08 DIAGNOSIS — E7800 Pure hypercholesterolemia, unspecified: Secondary | ICD-10-CM | POA: Diagnosis not present

## 2019-02-08 DIAGNOSIS — Z79899 Other long term (current) drug therapy: Secondary | ICD-10-CM | POA: Diagnosis not present

## 2019-02-08 DIAGNOSIS — Z7982 Long term (current) use of aspirin: Secondary | ICD-10-CM | POA: Insufficient documentation

## 2019-02-08 DIAGNOSIS — I10 Essential (primary) hypertension: Secondary | ICD-10-CM | POA: Insufficient documentation

## 2019-02-08 DIAGNOSIS — F319 Bipolar disorder, unspecified: Secondary | ICD-10-CM | POA: Diagnosis not present

## 2019-02-08 DIAGNOSIS — E1140 Type 2 diabetes mellitus with diabetic neuropathy, unspecified: Secondary | ICD-10-CM | POA: Insufficient documentation

## 2019-02-08 DIAGNOSIS — C3411 Malignant neoplasm of upper lobe, right bronchus or lung: Secondary | ICD-10-CM

## 2019-02-08 MED ORDER — DEXAMETHASONE SODIUM PHOSPHATE 10 MG/ML IJ SOLN
10.0000 mg | Freq: Once | INTRAMUSCULAR | Status: AC
  Administered 2019-02-08: 10 mg via INTRAVENOUS

## 2019-02-08 MED ORDER — SODIUM CHLORIDE 0.9 % IV SOLN
100.0000 mg/m2 | Freq: Once | INTRAVENOUS | Status: AC
  Administered 2019-02-08: 11:00:00 180 mg via INTRAVENOUS
  Filled 2019-02-08: qty 9

## 2019-02-08 MED ORDER — SODIUM CHLORIDE 0.9 % IV SOLN
Freq: Once | INTRAVENOUS | Status: AC
  Administered 2019-02-08: 10:00:00 via INTRAVENOUS
  Filled 2019-02-08: qty 250

## 2019-02-08 MED ORDER — DEXAMETHASONE SODIUM PHOSPHATE 10 MG/ML IJ SOLN
INTRAMUSCULAR | Status: AC
  Filled 2019-02-08: qty 1

## 2019-02-08 NOTE — Progress Notes (Signed)
[facility] CSW Progress NOtes  Report received that patient has had difficulty contacting [facility] bus to schedule "on demand" return trips at conclusion of [facility] CC visits.  Tried to call patient - her VM box is full - pt needs to be reminded to empty the box so messages can be left.  Per [facility] dispatcher, patient has scheduled defined [place] pick up windows for the next three appointments.  Patient needs to be out in the lobby by the beginning of these pick up windows as her transport can come at any times during these 30 minute windows.  If there are any delays and patient will not be ready on time, she needs to call [facility] dispatch [phone]) and as for a change in her pick up time.  If she needs to change the pick up time, she will need to wait in the lobby for the bus as [facility] cannot guarantee a definite pick up time for these "on demand" transports and has to work around their preexisting schedule.    Edwyna Shell, LCSW Clinical Social Worker Phone:  [phone]

## 2019-02-08 NOTE — Patient Instructions (Signed)
[facility] Discharge Instructions for Patients Receiving Chemotherapy  Today you received the following chemotherapy agents Etoposide (VEPESID).  To help prevent nausea and vomiting after your treatment, we encourage you to take your nausea medication as prescribed.   If you develop nausea and vomiting that is not controlled by your nausea medication, call the clinic.   BELOW ARE SYMPTOMS THAT SHOULD BE REPORTED IMMEDIATELY:  *FEVER GREATER THAN 100.5 F  *CHILLS WITH OR WITHOUT FEVER  NAUSEA AND VOMITING THAT IS NOT CONTROLLED WITH YOUR NAUSEA MEDICATION  *UNUSUAL SHORTNESS OF BREATH  *UNUSUAL BRUISING OR BLEEDING  TENDERNESS IN MOUTH AND THROAT WITH OR WITHOUT PRESENCE OF ULCERS  *URINARY PROBLEMS  *BOWEL PROBLEMS  UNUSUAL RASH Items with * indicate a potential emergency and should be followed up as soon as possible.  Feel free to call the clinic should you have any questions or concerns. The clinic phone number is [phone].  Please show the [facility] at check-in to the Emergency Department and triage nurse.  Coronavirus (COVID-19) Are you at risk?  Are you at risk for the Coronavirus (COVID-19)?  To be considered HIGH RISK for Coronavirus (COVID-19), you have to meet the following criteria:  . Traveled to [place], [place], [place], [place] or [place]; or in the [place] to [place], [place], [place], or [place]; and have fever, cough, and shortness of breath within the last 2 weeks of travel OR . Been in close contact with a person diagnosed with COVID-19 within the last 2 weeks and have fever, cough, and shortness of breath . IF YOU DO NOT MEET THESE CRITERIA, YOU ARE CONSIDERED LOW RISK FOR COVID-19.  What to do if you are HIGH RISK for COVID-19?  Marland Kitchen If you are having a medical emergency, call 911. . Seek medical care right away. Before you go to a doctor's office, urgent care or emergency department, call ahead and tell them  about your recent travel, contact with someone diagnosed with COVID-19, and your symptoms. You should receive instructions from your physician's office regarding next steps of care.  . When you arrive at healthcare provider, tell the healthcare staff immediately you have returned from visiting [place], [place], [place], [place] or [place]; or traveled in the [place] to [place], [place], [place], or [place]; in the last two weeks or you have been in close contact with a person diagnosed with COVID-19 in the last 2 weeks.   . Tell the health care staff about your symptoms: fever, cough and shortness of breath. . After you have been seen by a medical provider, you will be either: o Tested for (COVID-19) and discharged home on quarantine except to seek medical care if symptoms worsen, and asked to  - Stay home and avoid contact with others until you get your results (4-5 days)  - Avoid travel on public transportation if possible (such as bus, train, or airplane) or o Sent to the Emergency Department by EMS for evaluation, COVID-19 testing, and possible admission depending on your condition and test results.  What to do if you are LOW RISK for COVID-19?  Reduce your risk of any infection by using the same precautions used for avoiding the common cold or flu:  Marland Kitchen Wash your hands often with soap and warm water for at least 20 seconds.  If soap and water are not readily available, use an alcohol-based hand sanitizer with at least 60% alcohol.  . If coughing or  sneezing, cover your mouth and nose by coughing or sneezing into the elbow areas of your shirt or coat, into a tissue or into your sleeve (not your hands). . Avoid shaking hands with others and consider head nods or verbal greetings only. . Avoid touching your eyes, nose, or mouth with unwashed hands.  . Avoid close contact with people who are sick. . Avoid places or events with large numbers of people in one location, like concerts or  sporting events. . Carefully consider travel plans you have or are making. . If you are planning any travel outside or inside the [place], visit the CDC's Travelers' Health webpage for the latest health notices. . If you have some symptoms but not all symptoms, continue to monitor at home and seek medical attention if your symptoms worsen. . If you are having a medical emergency, call 911.   [place] / e-Visit: [email or web address]         [facility]: [facility]: [phone]                   [facility]: [phone]

## 2019-02-08 NOTE — Progress Notes (Signed)
Per Cassie, PA okay for patient to D/C home with PIV due to being a difficult stick.

## 2019-02-09 ENCOUNTER — Inpatient Hospital Stay: Payer: HMO

## 2019-02-09 ENCOUNTER — Other Ambulatory Visit: Payer: Self-pay

## 2019-02-09 ENCOUNTER — Encounter: Payer: Self-pay | Admitting: General Practice

## 2019-02-09 VITALS — BP 159/84 | HR 62 | Temp 97.8°F | Resp 18

## 2019-02-09 DIAGNOSIS — Z5111 Encounter for antineoplastic chemotherapy: Secondary | ICD-10-CM | POA: Diagnosis not present

## 2019-02-09 DIAGNOSIS — C3411 Malignant neoplasm of upper lobe, right bronchus or lung: Secondary | ICD-10-CM

## 2019-02-09 MED ORDER — DEXAMETHASONE SODIUM PHOSPHATE 10 MG/ML IJ SOLN
10.0000 mg | Freq: Once | INTRAMUSCULAR | Status: AC
  Administered 2019-02-09: 10 mg via INTRAVENOUS

## 2019-02-09 MED ORDER — DEXAMETHASONE SODIUM PHOSPHATE 10 MG/ML IJ SOLN
INTRAMUSCULAR | Status: AC
  Filled 2019-02-09: qty 1

## 2019-02-09 MED ORDER — SODIUM CHLORIDE 0.9 % IV SOLN
Freq: Once | INTRAVENOUS | Status: AC
  Administered 2019-02-09: 09:00:00 via INTRAVENOUS
  Filled 2019-02-09: qty 250

## 2019-02-09 MED ORDER — SODIUM CHLORIDE 0.9 % IV SOLN
100.0000 mg/m2 | Freq: Once | INTRAVENOUS | Status: AC
  Administered 2019-02-09: 180 mg via INTRAVENOUS
  Filled 2019-02-09: qty 9

## 2019-02-09 NOTE — Patient Instructions (Signed)
[facility] Discharge Instructions for Patients Receiving Chemotherapy  Today you received the following chemotherapy agents Etoposide (VEPESID).  To help prevent nausea and vomiting after your treatment, we encourage you to take your nausea medication as prescribed.   If you develop nausea and vomiting that is not controlled by your nausea medication, call the clinic.   BELOW ARE SYMPTOMS THAT SHOULD BE REPORTED IMMEDIATELY:  *FEVER GREATER THAN 100.5 F  *CHILLS WITH OR WITHOUT FEVER  NAUSEA AND VOMITING THAT IS NOT CONTROLLED WITH YOUR NAUSEA MEDICATION  *UNUSUAL SHORTNESS OF BREATH  *UNUSUAL BRUISING OR BLEEDING  TENDERNESS IN MOUTH AND THROAT WITH OR WITHOUT PRESENCE OF ULCERS  *URINARY PROBLEMS  *BOWEL PROBLEMS  UNUSUAL RASH Items with * indicate a potential emergency and should be followed up as soon as possible.  Feel free to call the clinic should you have any questions or concerns. The clinic phone number is [phone].  Please show the [facility] at check-in to the Emergency Department and triage nurse.  Coronavirus (COVID-19) Are you at risk?  Are you at risk for the Coronavirus (COVID-19)?  To be considered HIGH RISK for Coronavirus (COVID-19), you have to meet the following criteria:  . Traveled to [place], [place], [place], [place] or [place]; or in the [place] to [place], [place], [place], or [place]; and have fever, cough, and shortness of breath within the last 2 weeks of travel OR . Been in close contact with a person diagnosed with COVID-19 within the last 2 weeks and have fever, cough, and shortness of breath . IF YOU DO NOT MEET THESE CRITERIA, YOU ARE CONSIDERED LOW RISK FOR COVID-19.  What to do if you are HIGH RISK for COVID-19?  Marland Kitchen If you are having a medical emergency, call 911. . Seek medical care right away. Before you go to a doctor's office, urgent care or emergency department, call ahead and tell them  about your recent travel, contact with someone diagnosed with COVID-19, and your symptoms. You should receive instructions from your physician's office regarding next steps of care.  . When you arrive at healthcare provider, tell the healthcare staff immediately you have returned from visiting [place], [place], [place], [place] or [place]; or traveled in the [place] to [place], [place], [place], or [place]; in the last two weeks or you have been in close contact with a person diagnosed with COVID-19 in the last 2 weeks.   . Tell the health care staff about your symptoms: fever, cough and shortness of breath. . After you have been seen by a medical provider, you will be either: o Tested for (COVID-19) and discharged home on quarantine except to seek medical care if symptoms worsen, and asked to  - Stay home and avoid contact with others until you get your results (4-5 days)  - Avoid travel on public transportation if possible (such as bus, train, or airplane) or o Sent to the Emergency Department by EMS for evaluation, COVID-19 testing, and possible admission depending on your condition and test results.  What to do if you are LOW RISK for COVID-19?  Reduce your risk of any infection by using the same precautions used for avoiding the common cold or flu:  Marland Kitchen Wash your hands often with soap and warm water for at least 20 seconds.  If soap and water are not readily available, use an alcohol-based hand sanitizer with at least 60% alcohol.  . If coughing or  sneezing, cover your mouth and nose by coughing or sneezing into the elbow areas of your shirt or coat, into a tissue or into your sleeve (not your hands). . Avoid shaking hands with others and consider head nods or verbal greetings only. . Avoid touching your eyes, nose, or mouth with unwashed hands.  . Avoid close contact with people who are sick. . Avoid places or events with large numbers of people in one location, like concerts or  sporting events. . Carefully consider travel plans you have or are making. . If you are planning any travel outside or inside the [place], visit the CDC's Travelers' Health webpage for the latest health notices. . If you have some symptoms but not all symptoms, continue to monitor at home and seek medical attention if your symptoms worsen. . If you are having a medical emergency, call 911.   [place] / e-Visit: [email or web address]         [facility]: [facility]: [phone]                   [facility]: [phone]

## 2019-02-09 NOTE — Progress Notes (Signed)
[facility] CSW Progress Notes  Patient scheduled pickup by [facility] today from 10:30 - 11:00.  Per infusion room, she will not be ready before 11 AM.  Pick up rescheduled w [facility] for 11:00 - 11:30.  Patient must be in lobby by 11 AM.  Please notify CSW if further changes need to be made.  Edwyna Shell, LCSW Clinical Social Worker Phone:  [phone]

## 2019-02-11 ENCOUNTER — Inpatient Hospital Stay: Payer: HMO

## 2019-02-11 MED ORDER — PEGFILGRASTIM-CBQV 6 MG/0.6ML ~~LOC~~ SOSY
PREFILLED_SYRINGE | SUBCUTANEOUS | Status: AC
  Filled 2019-02-11: qty 0.6

## 2019-02-13 ENCOUNTER — Ambulatory Visit: Payer: Self-pay | Admitting: Pharmacist

## 2019-02-13 ENCOUNTER — Other Ambulatory Visit: Payer: Self-pay | Admitting: Pharmacist

## 2019-02-13 ENCOUNTER — Inpatient Hospital Stay: Payer: HMO

## 2019-02-13 NOTE — Patient Outreach (Signed)
[facility]) Care Management  [place]  02/13/2019  Tara Hopkins 14-Jan-1951 [id number]   Reason for referral: Medication Review  Referral source: [facility] Management RN with [employer] Current insurance: [employer]  Outreach:  Unsuccessful telephone call attempt #2 to patient.   HIPAA compliant voicemail left requesting a return call  Plan:  I will make another outreach attempt to patient within 3-4 business days   Ralene Bathe, PharmD, [place] [phone]

## 2019-02-14 ENCOUNTER — Inpatient Hospital Stay: Payer: HMO

## 2019-02-14 ENCOUNTER — Other Ambulatory Visit: Payer: Self-pay

## 2019-02-14 DIAGNOSIS — Z5111 Encounter for antineoplastic chemotherapy: Secondary | ICD-10-CM | POA: Diagnosis not present

## 2019-02-14 DIAGNOSIS — C3411 Malignant neoplasm of upper lobe, right bronchus or lung: Secondary | ICD-10-CM

## 2019-02-14 LAB — CMP (CANCER CENTER ONLY)
ALT: 15 U/L (ref 0–44)
AST: 13 U/L — ABNORMAL LOW (ref 15–41)
Albumin: 3 g/dL — ABNORMAL LOW (ref 3.5–5.0)
Alkaline Phosphatase: 97 U/L (ref 38–126)
Anion gap: 7 (ref 5–15)
BUN: 20 mg/dL (ref 8–23)
CO2: 26 mmol/L (ref 22–32)
Calcium: 8.7 mg/dL — ABNORMAL LOW (ref 8.9–10.3)
Chloride: 105 mmol/L (ref 98–111)
Creatinine: 1.14 mg/dL — ABNORMAL HIGH (ref 0.44–1.00)
GFR, Est AFR Am: 58 mL/min — ABNORMAL LOW (ref >60)
GFR, Estimated: 50 mL/min — ABNORMAL LOW (ref >60)
Glucose, Bld: 184 mg/dL — ABNORMAL HIGH (ref 70–99)
Potassium: 4.3 mmol/L (ref 3.5–5.1)
Sodium: 138 mmol/L (ref 135–145)
Total Bilirubin: 1 mg/dL (ref 0.3–1.2)
Total Protein: 6.4 g/dL — ABNORMAL LOW (ref 6.5–8.1)

## 2019-02-14 LAB — CBC WITH DIFFERENTIAL (CANCER CENTER ONLY)
Abs Immature Granulocytes: 0.06 10*3/uL (ref 0.00–0.07)
Basophils Absolute: 0 10*3/uL (ref 0.0–0.1)
Basophils Relative: 1 %
Eosinophils Absolute: 0 10*3/uL (ref 0.0–0.5)
Eosinophils Relative: 1 %
HCT: 28.4 % — ABNORMAL LOW (ref 36.0–46.0)
Hemoglobin: 8.9 g/dL — ABNORMAL LOW (ref 12.0–15.0)
Immature Granulocytes: 1 %
Lymphocytes Relative: 39 %
Lymphs Abs: 1.7 10*3/uL (ref 0.7–4.0)
MCH: 26.6 pg (ref 26.0–34.0)
MCHC: 31.3 g/dL (ref 30.0–36.0)
MCV: 85 fL (ref 80.0–100.0)
Monocytes Absolute: 0 10*3/uL — ABNORMAL LOW (ref 0.1–1.0)
Monocytes Relative: 1 %
Neutro Abs: 2.6 10*3/uL (ref 1.7–7.7)
Neutrophils Relative %: 57 %
Platelet Count: 326 10*3/uL (ref 150–400)
RBC: 3.34 MIL/uL — ABNORMAL LOW (ref 3.87–5.11)
RDW: 17.5 % — ABNORMAL HIGH (ref 11.5–15.5)
WBC Count: 4.4 10*3/uL (ref 4.0–10.5)
nRBC: 0 % (ref 0.0–0.2)

## 2019-02-15 ENCOUNTER — Telehealth: Payer: Self-pay | Admitting: Medical Oncology

## 2019-02-15 NOTE — Telephone Encounter (Signed)
Labs-pt called to ask if she needed growth factor injection . I called back and LM that she did not need an injection this week and to keep appt for next week.

## 2019-02-15 NOTE — Telephone Encounter (Signed)
Pt does not need udencya now. Monitor pt for now.

## 2019-02-16 ENCOUNTER — Other Ambulatory Visit: Payer: Self-pay | Admitting: Pharmacist

## 2019-02-16 ENCOUNTER — Ambulatory Visit: Payer: Self-pay | Admitting: Pharmacist

## 2019-02-16 NOTE — Patient Outreach (Signed)
[facility]) Care Management  [place] 02/16/2019  Tara Hopkins May 04, 1951 [id number]  Reason for referral: Medication Review  Referral source: [place] Management RN with [employer] Current insurance: [employer]  Outreach:  Unsuccessful telephone call attempt #3 to patient. HIPAA compliant voicemail left requesting a return call  Plan:  -I will close [place] case at this time as I have been unable to establish and/or maintain contact with patient.  -I am happy to assist in the future as needed.    Ralene Bathe, PharmD, [place] [phone]

## 2019-02-16 NOTE — Telephone Encounter (Addendum)
Tried to reach the pt.  Have discussed this with the pt in the past.  She cannot receive a home health aid unless she is receiving skilled nursing care through a home health agency.  She will have to pay out of pocket from an organization of her choosing if she wants to proceed.  She can have any necessary paper work sent to [place] by fax or she can drop it off at the front desk.

## 2019-02-20 ENCOUNTER — Telehealth: Payer: Self-pay | Admitting: Medical Oncology

## 2019-02-20 ENCOUNTER — Other Ambulatory Visit: Payer: Self-pay

## 2019-02-20 ENCOUNTER — Inpatient Hospital Stay: Payer: HMO

## 2019-02-20 DIAGNOSIS — Z5111 Encounter for antineoplastic chemotherapy: Secondary | ICD-10-CM | POA: Diagnosis not present

## 2019-02-20 DIAGNOSIS — C3411 Malignant neoplasm of upper lobe, right bronchus or lung: Secondary | ICD-10-CM

## 2019-02-20 LAB — CBC WITH DIFFERENTIAL (CANCER CENTER ONLY)
Abs Immature Granulocytes: 0 10*3/uL (ref 0.00–0.07)
Basophils Absolute: 0 10*3/uL (ref 0.0–0.1)
Basophils Relative: 0 %
Eosinophils Absolute: 0 10*3/uL (ref 0.0–0.5)
Eosinophils Relative: 1 %
HCT: 26.1 % — ABNORMAL LOW (ref 36.0–46.0)
Hemoglobin: 8 g/dL — ABNORMAL LOW (ref 12.0–15.0)
Immature Granulocytes: 0 %
Lymphocytes Relative: 76 %
Lymphs Abs: 1.7 10*3/uL (ref 0.7–4.0)
MCH: 26.6 pg (ref 26.0–34.0)
MCHC: 30.7 g/dL (ref 30.0–36.0)
MCV: 86.7 fL (ref 80.0–100.0)
Monocytes Absolute: 0.1 10*3/uL (ref 0.1–1.0)
Monocytes Relative: 4 %
Neutro Abs: 0.4 10*3/uL — CL (ref 1.7–7.7)
Neutrophils Relative %: 19 %
Platelet Count: 123 10*3/uL — ABNORMAL LOW (ref 150–400)
RBC: 3.01 MIL/uL — ABNORMAL LOW (ref 3.87–5.11)
RDW: 17.1 % — ABNORMAL HIGH (ref 11.5–15.5)
WBC Count: 2.3 10*3/uL — ABNORMAL LOW (ref 4.0–10.5)
nRBC: 0 % (ref 0.0–0.2)

## 2019-02-20 LAB — CMP (CANCER CENTER ONLY)
ALT: 13 U/L (ref 0–44)
AST: 14 U/L — ABNORMAL LOW (ref 15–41)
Albumin: 2.9 g/dL — ABNORMAL LOW (ref 3.5–5.0)
Alkaline Phosphatase: 119 U/L (ref 38–126)
Anion gap: 10 (ref 5–15)
BUN: 23 mg/dL (ref 8–23)
CO2: 23 mmol/L (ref 22–32)
Calcium: 8.3 mg/dL — ABNORMAL LOW (ref 8.9–10.3)
Chloride: 106 mmol/L (ref 98–111)
Creatinine: 1.56 mg/dL — ABNORMAL HIGH (ref 0.44–1.00)
GFR, Est AFR Am: 40 mL/min — ABNORMAL LOW
GFR, Estimated: 34 mL/min — ABNORMAL LOW
Glucose, Bld: 229 mg/dL — ABNORMAL HIGH (ref 70–99)
Potassium: 4.5 mmol/L (ref 3.5–5.1)
Sodium: 139 mmol/L (ref 135–145)
Total Bilirubin: <0.2 mg/dL — ABNORMAL LOW (ref 0.3–1.2)
Total Protein: 6.4 g/dL — ABNORMAL LOW (ref 6.5–8.1)

## 2019-02-20 NOTE — Telephone Encounter (Signed)
LVM with neutropenic precautions and to call for temp >/= 100.5f

## 2019-02-21 NOTE — Telephone Encounter (Signed)
LMOM for a return call.  

## 2019-02-22 ENCOUNTER — Other Ambulatory Visit: Payer: Self-pay | Admitting: Physician Assistant

## 2019-02-22 DIAGNOSIS — C3411 Malignant neoplasm of upper lobe, right bronchus or lung: Secondary | ICD-10-CM

## 2019-02-23 ENCOUNTER — Telehealth: Payer: Self-pay | Admitting: *Deleted

## 2019-02-23 ENCOUNTER — Telehealth: Payer: Self-pay | Admitting: Physician Assistant

## 2019-02-23 NOTE — Telephone Encounter (Signed)
Called the patient and left a voicemail. She is has an appointment with me on 4/20. I had ordered at restaging CT scan that has not been scheduled. I left a voicemail with the number to the [facility] imaging center and requested that the patient call and schedule her scan at her convenience. I will try calling her daughter as well.

## 2019-02-23 NOTE — Telephone Encounter (Signed)
Spoke with patient about scheduling her CT scan. The patient stated that she has a transportation problem and did not want to get her CT scan done tomorrow because she does not want to ride the bus tomorrow and next week. I told her I would reach out to our transportation specialist. She expressed understanding.

## 2019-02-23 NOTE — Telephone Encounter (Signed)
Attempted to reach patient to let her know that her scan needs to be scheduled.  Location was changed to [place] because of COVID 19 hospital changes.  Left Message pending call back.

## 2019-02-23 NOTE — Telephone Encounter (Signed)
I tried to explain to pt yesterday I did not see [facility] had called, but oncology tried to call.  She said no, it was from [facility]. I relayed the message to pt today from [facility] and now she believe me.

## 2019-02-23 NOTE — Telephone Encounter (Signed)
Copied from [place] [id number]. Topic: General - Inquiry >> Feb 22, 2019 10:32 AM Scherrie Gerlach wrote: Reason for CRM:  pt states she thinks Tommi Rumps called her and she wants to know why.  Clinic RN called patient back no answer. LVM informing her that office did not call her but her oncology office did and to contact them.

## 2019-02-25 ENCOUNTER — Other Ambulatory Visit: Payer: Self-pay | Admitting: Adult Health

## 2019-02-25 DIAGNOSIS — I10 Essential (primary) hypertension: Secondary | ICD-10-CM

## 2019-02-27 ENCOUNTER — Other Ambulatory Visit: Payer: Self-pay

## 2019-02-27 ENCOUNTER — Encounter: Payer: Self-pay | Admitting: Physician Assistant

## 2019-02-27 ENCOUNTER — Inpatient Hospital Stay (HOSPITAL_BASED_OUTPATIENT_CLINIC_OR_DEPARTMENT_OTHER): Payer: HMO | Admitting: Physician Assistant

## 2019-02-27 ENCOUNTER — Inpatient Hospital Stay: Payer: HMO

## 2019-02-27 ENCOUNTER — Encounter: Payer: Self-pay | Admitting: *Deleted

## 2019-02-27 VITALS — BP 137/68 | HR 64 | Temp 98.3°F | Resp 18 | Ht 65.0 in | Wt 163.3 lb

## 2019-02-27 DIAGNOSIS — M869 Osteomyelitis, unspecified: Secondary | ICD-10-CM | POA: Diagnosis not present

## 2019-02-27 DIAGNOSIS — C3412 Malignant neoplasm of upper lobe, left bronchus or lung: Secondary | ICD-10-CM | POA: Diagnosis not present

## 2019-02-27 DIAGNOSIS — E1142 Type 2 diabetes mellitus with diabetic polyneuropathy: Secondary | ICD-10-CM | POA: Diagnosis not present

## 2019-02-27 DIAGNOSIS — C3411 Malignant neoplasm of upper lobe, right bronchus or lung: Secondary | ICD-10-CM

## 2019-02-27 DIAGNOSIS — G8194 Hemiplegia, unspecified affecting left nondominant side: Secondary | ICD-10-CM | POA: Diagnosis not present

## 2019-02-27 DIAGNOSIS — I639 Cerebral infarction, unspecified: Secondary | ICD-10-CM | POA: Diagnosis not present

## 2019-02-27 DIAGNOSIS — Z5111 Encounter for antineoplastic chemotherapy: Secondary | ICD-10-CM

## 2019-02-27 DIAGNOSIS — Z79899 Other long term (current) drug therapy: Secondary | ICD-10-CM | POA: Diagnosis not present

## 2019-02-27 LAB — CMP (CANCER CENTER ONLY)
ALT: 12 U/L (ref 0–44)
AST: 13 U/L — ABNORMAL LOW (ref 15–41)
Albumin: 3.1 g/dL — ABNORMAL LOW (ref 3.5–5.0)
Alkaline Phosphatase: 121 U/L (ref 38–126)
Anion gap: 10 (ref 5–15)
BUN: 16 mg/dL (ref 8–23)
CO2: 22 mmol/L (ref 22–32)
Calcium: 8.9 mg/dL (ref 8.9–10.3)
Chloride: 109 mmol/L (ref 98–111)
Creatinine: 1.42 mg/dL — ABNORMAL HIGH (ref 0.44–1.00)
GFR, Est AFR Am: 44 mL/min — ABNORMAL LOW (ref >60)
GFR, Estimated: 38 mL/min — ABNORMAL LOW (ref >60)
Glucose, Bld: 156 mg/dL — ABNORMAL HIGH (ref 70–99)
Potassium: 4.3 mmol/L (ref 3.5–5.1)
Sodium: 141 mmol/L (ref 135–145)
Total Bilirubin: <0.2 mg/dL — ABNORMAL LOW (ref 0.3–1.2)
Total Protein: 6.8 g/dL (ref 6.5–8.1)

## 2019-02-27 LAB — CBC WITH DIFFERENTIAL (CANCER CENTER ONLY)
Abs Immature Granulocytes: 0.05 10*3/uL (ref 0.00–0.07)
Basophils Absolute: 0 10*3/uL (ref 0.0–0.1)
Basophils Relative: 0 %
Eosinophils Absolute: 0.1 10*3/uL (ref 0.0–0.5)
Eosinophils Relative: 2 %
HCT: 30.7 % — ABNORMAL LOW (ref 36.0–46.0)
Hemoglobin: 9.5 g/dL — ABNORMAL LOW (ref 12.0–15.0)
Immature Granulocytes: 1 %
Lymphocytes Relative: 57 %
Lymphs Abs: 2.4 10*3/uL (ref 0.7–4.0)
MCH: 27.6 pg (ref 26.0–34.0)
MCHC: 30.9 g/dL (ref 30.0–36.0)
MCV: 89.2 fL (ref 80.0–100.0)
Monocytes Absolute: 0.4 10*3/uL (ref 0.1–1.0)
Monocytes Relative: 9 %
Neutro Abs: 1.3 10*3/uL — ABNORMAL LOW (ref 1.7–7.7)
Neutrophils Relative %: 31 %
Platelet Count: 274 10*3/uL (ref 150–400)
RBC: 3.44 MIL/uL — ABNORMAL LOW (ref 3.87–5.11)
RDW: 20.1 % — ABNORMAL HIGH (ref 11.5–15.5)
WBC Count: 4.2 10*3/uL (ref 4.0–10.5)
nRBC: 0 % (ref 0.0–0.2)

## 2019-02-27 MED ORDER — SODIUM CHLORIDE 0.9 % IV SOLN
Freq: Once | INTRAVENOUS | Status: AC
  Administered 2019-02-27: 10:00:00 via INTRAVENOUS
  Filled 2019-02-27: qty 5

## 2019-02-27 MED ORDER — SODIUM CHLORIDE 0.9 % IV SOLN
100.0000 mg/m2 | Freq: Once | INTRAVENOUS | Status: AC
  Administered 2019-02-27: 180 mg via INTRAVENOUS
  Filled 2019-02-27: qty 9

## 2019-02-27 MED ORDER — SODIUM CHLORIDE 0.9 % IV SOLN
344.5000 mg | Freq: Once | INTRAVENOUS | Status: AC
  Administered 2019-02-27: 340 mg via INTRAVENOUS
  Filled 2019-02-27: qty 34

## 2019-02-27 MED ORDER — SODIUM CHLORIDE 0.9 % IV SOLN
Freq: Once | INTRAVENOUS | Status: AC
  Administered 2019-02-27: 10:00:00 via INTRAVENOUS
  Filled 2019-02-27: qty 250

## 2019-02-27 MED ORDER — PALONOSETRON HCL INJECTION 0.25 MG/5ML
INTRAVENOUS | Status: AC
  Filled 2019-02-27: qty 5

## 2019-02-27 MED ORDER — PALONOSETRON HCL INJECTION 0.25 MG/5ML
0.2500 mg | Freq: Once | INTRAVENOUS | Status: AC
  Administered 2019-02-27: 0.25 mg via INTRAVENOUS

## 2019-02-27 NOTE — Progress Notes (Signed)
Okay to treat D1C3 Carboplatin and Etoposide with ANC of 1.3 per Cassandra Heilingoetter. PA-C.

## 2019-02-27 NOTE — Patient Instructions (Signed)
[place] Discharge Instructions for Patients Receiving Chemotherapy  Today you received the following chemotherapy agents: Carboplatin and Etoposide.   To help prevent nausea and vomiting after your treatment, we encourage you to take your nausea medication as directed.    If you develop nausea and vomiting that is not controlled by your nausea medication, call the clinic.   BELOW ARE SYMPTOMS THAT SHOULD BE REPORTED IMMEDIATELY:  *FEVER GREATER THAN 100.5 F  *CHILLS WITH OR WITHOUT FEVER  NAUSEA AND VOMITING THAT IS NOT CONTROLLED WITH YOUR NAUSEA MEDICATION  *UNUSUAL SHORTNESS OF BREATH  *UNUSUAL BRUISING OR BLEEDING  TENDERNESS IN MOUTH AND THROAT WITH OR WITHOUT PRESENCE OF ULCERS  *URINARY PROBLEMS  *BOWEL PROBLEMS  UNUSUAL RASH Items with * indicate a potential emergency and should be followed up as soon as possible.  Feel free to call the clinic should you have any questions or concerns. The clinic phone number is [phone].  Please show the [place] at check-in to the Emergency Department and triage nurse.

## 2019-02-27 NOTE — Progress Notes (Signed)
[facility] OFFICE PROGRESS NOTE  Tara Peng, NP [place] [address]  DIAGNOSIS: Limited stage (T1c, N2, M0) small cell lung cancer presented with left upper lobe lung nodule in addition to mediastinal lymphadenopathy diagnosed in February 2020.  PRIOR THERAPY: None  CURRENT THERAPY: Systemic chemotherapy with carboplatin for AUC of 5 on day 1 and etoposide at 100 mg/M2 on days 1, 2 and 3 with Neulasta support every 3 weeks. First dose January 16, 2019. Status post cycle 2.  INTERVAL HISTORY: Tara Hopkins 68 y.o. female returns to the clinic for a follow-up visit.  The patient is feeling well today without any concerning complaints.  She tolerated her last treatment well without any adverse effects.  During her last cycle of treatment, Emend was added to her premedications per pharmacy recommendation for her chemotherapy induced nausea and vomiting which seem to have significantly helped her nausea. She denied any nausea or vomiting after the last treatment. The patient also picked up Imodium for diarrhea which she states has been very helpful in managing any diarrhea. She denies any fever, chills, night sweats, or weight loss.  She denies any chest pain, shortness of breath, cough, or hemoptysis.  She denies any nausea, vomiting, diarrhea, or constipation.  She denies any headaches or visual changes.  The patient missed her Udenyca injection after the last cycle of treatment due to missing her bus. She was also supposed to have a restaging CT scan performed prior to her appointment today; however, this was never scheduled. The patient is here today for evaluation before starting cycle #3 of her treatment.  MEDICAL HISTORY: Past Medical History:  Diagnosis Date  . Anemia   . Anxiety   . Bipolar 1 disorder ([facility])   . Cataracts, bilateral   . Depression   . Diabetes mellitus without complication (Tara Hopkins)   . Glaucoma   . History of blood transfusion    . History of hyperbaric oxygen therapy    pt reports 80 treatments.  . Hypercholesteremia   . Hypertension   . Mediastinal adenopathy   . Peripheral neuropathy   . Pneumonia   . Stroke (Tara Hopkins) 02/2015  . Wears glasses     ALLERGIES:  has No Known Allergies.  MEDICATIONS:  Current Outpatient Medications  Medication Sig Dispense Refill  . amLODipine (NORVASC) 5 MG tablet Take 1 tablet (5 mg total) by mouth daily for 30 days. 30 tablet 0  . aspirin 325 MG EC tablet Take 1 tablet (325 mg total) by mouth daily. 30 tablet 0  . aspirin EC 81 MG tablet Take 81 mg by mouth daily.    Marland Kitchen atorvastatin (LIPITOR) 40 MG tablet TAKE ONE TABLET BY MOUTH DAILY AT 6 PM 90 tablet 0  . citalopram (CELEXA) 10 MG tablet Take 1 tablet (10 mg total) by mouth daily. 90 tablet 1  . clonazePAM (KLONOPIN) 0.5 MG tablet Take 1 tablet (0.5 mg total) by mouth 3 (three) times daily. 90 tablet 1  . gabapentin (NEURONTIN) 400 MG capsule TAKE 3 CAPSULES BY MOUTH THREE TIMES A DAY (Patient taking differently: Take 1,200 mg by mouth 3 (three) times daily. TAKE 3 CAPSULES BY MOUTH THREE TIMES A DAY) 270 capsule 2  . glimepiride (AMARYL) 4 MG tablet Take 1 tablet (4 mg total) by mouth daily with breakfast. 90 tablet 0  . glucose blood (ONETOUCH VERIO) test strip USE TO TEST BLOOD GLUCOSE 3 TIMES DAILY 100 each 12  . Insulin Glargine (LANTUS SOLOSTAR)  100 UNIT/ML Solostar Pen Inject 25 Units into the skin at bedtime. (Patient taking differently: Inject 40 Units into the skin at bedtime. ) 15 mL 6  . Insulin Pen Needle (B-D UF III MINI PEN NEEDLES) 31G X 5 MM MISC Use as directed. 100 each 3  . latanoprost (XALATAN) 0.005 % ophthalmic solution INSTILL 1 DROP INTO BOTH EYES EVERY EVENING (Patient taking differently: Place 1 drop into both eyes at bedtime. ) 7.5 mL 0  . lisinopril (PRINIVIL,ZESTRIL) 40 MG tablet Take 1 tablet (40 mg total) by mouth daily. 90 tablet 1  . ONE TOUCH LANCETS MISC USE TO CHECK BLOOD SUGAR 3 TIMES DAILY  200 each 3  . prochlorperazine (COMPAZINE) 10 MG tablet TAKE ONE TABLET BY MOUTH EVERY 6 HOURS AS NEEDED FOR NAUSEA OR VOMITING 30 tablet 0   No current facility-administered medications for this visit.    Facility-Administered Medications Ordered in Other Visits  Medication Dose Route Frequency Provider Last Rate Last Dose  . CARBOplatin (PARAPLATIN) 340 mg in sodium chloride 0.9 % 100 mL chemo infusion  340 mg Intravenous Once Tara Bears, MD      . etoposide (VEPESID) 180 mg in sodium chloride 0.9 % 500 mL chemo infusion  100 mg/m2 (Treatment Plan Recorded) Intravenous Once Tara Bears, MD      . fosaprepitant (EMEND) 150 mg, dexamethasone (DECADRON) 12 mg in sodium chloride 0.9 % 145 mL IVPB   Intravenous Once Tara Bears, MD      . palonosetron (ALOXI) injection 0.25 mg  0.25 mg Intravenous Once Tara Bears, MD        SURGICAL HISTORY:  Past Surgical History:  Procedure Laterality Date  . AMPUTATION Right 12/15/2013   Procedure: Right Transmetatarsal Amputation;  Surgeon: Newt Minion, MD;  Location: [facility];  Service: Orthopedics;  Laterality: Right;  . AMPUTATION Right 02/23/2014   Procedure: AMPUTATION FOOT;  Surgeon: Newt Minion, MD;  Location: [facility];  Service: Orthopedics;  Laterality: Right;  Right Foot Revision Transmetatarsal Amputation  . CATARACT EXTRACTION W/ INTRAOCULAR LENS  IMPLANT, BILATERAL    . COLONOSCOPY W/ BIOPSIES AND POLYPECTOMY    . I&D EXTREMITY Right 05/23/2013   Procedure: IRRIGATION AND DEBRIDEMENT RIGHT GREAT TOE;  Surgeon: Mauri Pole, MD;  Location: [facility];  Service: Orthopedics;  Laterality: Right;  . TUBAL LIGATION    . VIDEO BRONCHOSCOPY WITH ENDOBRONCHIAL ULTRASOUND N/A 12/22/2018   Procedure: VIDEO BRONCHOSCOPY WITH ENDOBRONCHIAL ULTRASOUND;  Surgeon: [name], DO;  Location: [facility];  Service: Thoracic;  Laterality: N/A;    REVIEW OF SYSTEMS:   Review of Systems  Constitutional: Negative for appetite change, chills,  fatigue, fever and unexpected weight change.  HENT:   Negative for mouth sores, nosebleeds, sore throat and trouble swallowing.   Eyes: Negative for eye problems and icterus.  Respiratory: Negative for cough, hemoptysis, shortness of breath and wheezing.   Cardiovascular: Negative for chest pain and leg swelling.  Gastrointestinal: Negative for abdominal pain, constipation, diarrhea, nausea and vomiting.  Genitourinary: Negative for bladder incontinence, difficulty urinating, dysuria, frequency and hematuria.   Musculoskeletal: Negative for back pain, gait problem, neck pain and neck stiffness.  Skin: Negative for itching and rash.  Neurological: Negative for dizziness, extremity weakness, gait problem, headaches, light-headedness and seizures.  Hematological: Negative for adenopathy. Does not bruise/bleed easily.  Psychiatric/Behavioral: Negative for confusion, depression and sleep disturbance. The patient is not nervous/anxious.     PHYSICAL EXAMINATION:  Blood pressure 137/68, pulse 64, temperature 98.3  F (36.8 C), temperature source Oral, resp. rate 18, height 5\' 5"  (1.651 m), weight 163 lb 4.8 oz (74.1 kg), SpO2 97 %.  ECOG PERFORMANCE STATUS: 2 - Symptomatic, <50% confined to bed  Physical Exam  Constitutional: Oriented to person, place, and time and well-developed, well-nourished, and in no distress. No distress.  HENT:  Head: Normocephalic and atraumatic.  Mouth/Throat: Oropharynx is clear and moist. No oropharyngeal exudate.  Eyes: Conjunctivae are normal. Right eye exhibits no discharge. Left eye exhibits no discharge. No scleral icterus.  Neck: Normal range of motion. Neck supple.  Cardiovascular: Normal rate, regular rhythm, normal heart sounds and intact distal pulses.   Pulmonary/Chest: Effort normal and breath sounds normal. No respiratory distress. No wheezes. No rales.  Abdominal: Soft. Bowel sounds are normal. Exhibits no distension and no mass. There is no tenderness.   Musculoskeletal: Right Transmetatarsal Amputation. Normal range of motion. Exhibits no edema.  Lymphadenopathy:    No cervical adenopathy.  Neurological: Alert and oriented to person, place, and time. Exhibits normal muscle tone. Gait normal. Coordination normal.  Skin: Skin is warm and dry. No rash noted. Not diaphoretic. No erythema. No pallor.  Psychiatric: Mood, memory and judgment normal.  Vitals reviewed.  LABORATORY DATA: Lab Results  Component Value Date   WBC 4.2 02/27/2019   HGB 9.5 (L) 02/27/2019   HCT 30.7 (L) 02/27/2019   MCV 89.2 02/27/2019   PLT 274 02/27/2019      Chemistry      Component Value Date/Time   NA 141 02/27/2019 0734   K 4.3 02/27/2019 0734   CL 109 02/27/2019 0734   CO2 22 02/27/2019 0734   BUN 16 02/27/2019 0734   CREATININE 1.42 (H) 02/27/2019 0734      Component Value Date/Time   CALCIUM 8.9 02/27/2019 0734   ALKPHOS 121 02/27/2019 0734   AST 13 (L) 02/27/2019 0734   ALT 12 02/27/2019 0734   BILITOT <0.2 (L) 02/27/2019 0734       RADIOGRAPHIC STUDIES:  No results found.   ASSESSMENT/PLAN:  This is a very pleasant 68 year old African-American female recently diagnosed with limited stage (T1c, N2, M0) small cell lung cancer who presented with a right upper lobe lung nodule in addition to mediastinal lymphadenopathy.  She was diagnosed in February 2020. She is currently undergoing chemotherapy with carboplatin for an AUC of 5 on days 1 and etoposide 100 mg/m2 on days 1, 2, and 3 with Neulasta support every 3 weeks.  Status post 2 cycles.  The patient experienced nausea, vomiting, and diarrhea after the first cycle of treatment.  Since her first cycle of treatment, the patient has been using Compazine and Imodium. She also had adjustments to her premedications per pharmacy recommendation. She denies any adverse effects such as nausea, vomiting, or diarrhea with her second cycle of treatment. She missed her Udenyca injection after cycle 2 due  to difficulties obtaining transportation.   The patient was seen with Dr. Julien Nordmann today.  Labs were reviewed with the patient. The patient's ANC was low at 0.4 last week due to missing her Udenyca injection. It has recovered to 1.3 today with a WBC count of 4.2. Dr. Julien Nordmann recommends she proceed with cycle 3 today as scheduled. The patient is aware of the importance of receiving her Udenyca injection and knows to call us in the future to reschedule, should she miss her ride for her future appointments.   The patient was not able to get her CT scan of the chest  performed to restage her disease. The patient states [facility] is the most accessible site to obtain her scan. I will put in an order for a restaging CT chest without contrast to be performed prior to her next office visit. She will not receive contrast due to her renal insuffiencey.   I will see her back for a follow-up visit in 3 weeks for evaluation to review her scan results prior to starting cycle #4.  For her hypertension, the patient's blood pressure was improved compared to prior appointments due to her taking her blood pressure medication this morning. She was encouraged to continue taking her blood pressure medication as prescribed.   She will continue with Claritin for myalgias, 400 mg 3 times daily of gabapentin for neuropathy secondary to diabetes, Compazine for nausea, and Imodium for diarrhea.   The patient was advised to call immediately if she has any concerning symptoms in the interval. The patient voices understanding of current disease status and treatment options and is in agreement with the current care plan. All questions were answered. The patient knows to call the clinic with any problems, questions or concerns. We can certainly see the patient much sooner if necessary   Orders Placed This Encounter  Procedures  . CT Chest Wo Contrast    Standing Status:   Future    Standing Expiration Date:   02/27/2020     Order Specific Question:   ** REASON FOR EXAM (FREE TEXT)    Answer:   Restaging Lung Cancer    Order Specific Question:   Preferred imaging location?    Answer:   [facility]    Order Specific Question:   Radiology Contrast Protocol - do NOT remove file path    Answer:   [email or web address]     Tara Makela L Lakin Romer, PA-C 02/27/19  ADDENDUM: Hematology/Oncology Attending: I had a face-to-face encounter with the patient today.  I recommended her care plan.  This is very pleasant 68 years old African-American female with limited stage small cell lung cancer and currently undergoing systemic chemotherapy with carboplatin and etoposide status post 2 cycles.  The patient has been tolerating this treatment well with no concerning adverse effects.  She missed her Neulasta injection after cycle #2.  Her total white blood count recovered today.  She was also supposed to have CT scan of the chest before this visit but unfortunately for logistic reason it was not performed. I recommended for the patient to proceed with cycle #3 today as a schedule and we will try to arrange for her scan to be done before her next visit. I also strongly encouraged the patient to keep her appointment for the Neulasta injection after her treatment to avoid any neutropenia or neutropenic fever with her treatment. She was advised to call immediately if she has any concerning symptoms in the interval.  Disclaimer: This note was dictated with voice recognition software. Similar sounding words can inadvertently be transcribed and may be missed upon review. Eilleen Kempf, MD 02/27/19

## 2019-02-27 NOTE — Progress Notes (Signed)
Neulasta Onpro Recommendation Note  Patient was talked to in infusion in regards to switching G-CSF therapy to Neulasta Onpro (pegfilgrastim). Patient was educated on the purpose of this proposed change in therapy due to COVID-19 pandemic. Patient was educated about Neulasta Onpro on-body injector and patient will be provided with an educational video while in infusion during this treatment cycle.   No auth required per [place] on 02/27/19.   Based on the following circumstances, this patient would qualify for need for Neulasta Onpro (pegfilgrastim) therapy:  [x]  G-CSF is clinically necessary and warranted for this patient based on risk of neutropenia per NCCN guidelines.   [x]  Patient would benefit from decreased exposure to healthcare facility due to COVID-19 pandemic.  [x]  Patient medical oncologist preference and recommends for patient to receive Neulasta Onpro (pegfilgrastim).   Final Recommendation:   [x]  Patient agrees to change in therapy. Change to Neulasta Onpro.  []  Patient does not agree to change in therapy. No change to Neulasta Onpro at this time.    Thank Candise Bowens [facility]  02/27/2019 10:20 AM

## 2019-02-27 NOTE — Progress Notes (Signed)
[place] Work  Clinical Social Work met with patient in infusion room to provide support and assess for needs.  Patient completed SCAT application with APP, CSW will turn in application on patient's behalf.  Ms. Basley reported she is "getting along  Much better" with her daughter. She shared her grandchildren came over to her house over the weekend to clean it for her, she enjoyed spending time with them.  CSW encouraged patient to follow up as needed.  Gwinda Maine, LCSW  Clinical Social Worker [facility]

## 2019-02-28 ENCOUNTER — Inpatient Hospital Stay: Payer: HMO

## 2019-02-28 ENCOUNTER — Other Ambulatory Visit: Payer: Self-pay

## 2019-02-28 VITALS — BP 167/87 | HR 76 | Temp 97.8°F | Resp 18

## 2019-02-28 DIAGNOSIS — C3411 Malignant neoplasm of upper lobe, right bronchus or lung: Secondary | ICD-10-CM

## 2019-02-28 DIAGNOSIS — Z5111 Encounter for antineoplastic chemotherapy: Secondary | ICD-10-CM | POA: Diagnosis not present

## 2019-02-28 MED ORDER — DEXAMETHASONE SODIUM PHOSPHATE 10 MG/ML IJ SOLN
10.0000 mg | Freq: Once | INTRAMUSCULAR | Status: AC
  Administered 2019-02-28: 09:00:00 10 mg via INTRAVENOUS

## 2019-02-28 MED ORDER — SODIUM CHLORIDE 0.9 % IV SOLN
Freq: Once | INTRAVENOUS | Status: AC
  Administered 2019-02-28: 09:00:00 via INTRAVENOUS
  Filled 2019-02-28: qty 250

## 2019-02-28 MED ORDER — DEXAMETHASONE SODIUM PHOSPHATE 10 MG/ML IJ SOLN
INTRAMUSCULAR | Status: AC
  Filled 2019-02-28: qty 1

## 2019-02-28 MED ORDER — SODIUM CHLORIDE 0.9 % IV SOLN
100.0000 mg/m2 | Freq: Once | INTRAVENOUS | Status: AC
  Administered 2019-02-28: 180 mg via INTRAVENOUS
  Filled 2019-02-28: qty 9

## 2019-02-28 NOTE — Progress Notes (Signed)
Nutrition Follow-up:  RD working remotely.  Patient with small cell lung cancer.  Patient followed by Dr. Julien Nordmann.  Patient receiving chemotherapy.  Called patient for nutrition follow-up.  Patient reports that her appetite is up and down.  Reports that she normally eats good.  Reports eats sausage biscuit or egg for breakfast with coffee and juice. Lunch is usually a sandwich and supper is meat and vegetables.  Reports that she tried the oral nutrition supplement shakes that RD gave her on 3/11 visit and loved them.  Has not purchased them yet but plans to get some today.   Denies any nutrition impact symptoms.      Medications: reviewed  Labs: reviewed  Anthropometrics:   Weight today was 163 lb increased from 157 lb on 3/11 RD visit.     NUTRITION DIAGNOSIS: Unintentional weight loss improved   INTERVENTION:  Encouraged patient to continue 3 meals per day and add oral nutrition or snack between meals to prevent weight loss. Contact information given     MONITORING, EVALUATION, GOAL: Patient will tolerate adequate calories and protein for weight maintenance   NEXT VISIT: Tuesday, May 12 phone f/u  Rachelle Edwards B. Zenia Resides, [place], [place] Registered Dietitian [phone] (pager)

## 2019-02-28 NOTE — Telephone Encounter (Signed)
Does pt qualify for skilled nursing?

## 2019-02-28 NOTE — Telephone Encounter (Signed)
Sent to the pharmacy by e-scribe. 

## 2019-02-28 NOTE — Patient Instructions (Signed)
[facility] Discharge Instructions for Patients Receiving Chemotherapy  Today you received the following chemotherapy agents Etoposide (VEPESID).  To help prevent nausea and vomiting after your treatment, we encourage you to take your nausea medication as prescribed.   If you develop nausea and vomiting that is not controlled by your nausea medication, call the clinic.   BELOW ARE SYMPTOMS THAT SHOULD BE REPORTED IMMEDIATELY:  *FEVER GREATER THAN 100.5 F  *CHILLS WITH OR WITHOUT FEVER  NAUSEA AND VOMITING THAT IS NOT CONTROLLED WITH YOUR NAUSEA MEDICATION  *UNUSUAL SHORTNESS OF BREATH  *UNUSUAL BRUISING OR BLEEDING  TENDERNESS IN MOUTH AND THROAT WITH OR WITHOUT PRESENCE OF ULCERS  *URINARY PROBLEMS  *BOWEL PROBLEMS  UNUSUAL RASH Items with * indicate a potential emergency and should be followed up as soon as possible.  Feel free to call the clinic should you have any questions or concerns. The clinic phone number is [phone].  Please show the [facility] at check-in to the Emergency Department and triage nurse.  Coronavirus (COVID-19) Are you at risk?  Are you at risk for the Coronavirus (COVID-19)?  To be considered HIGH RISK for Coronavirus (COVID-19), you have to meet the following criteria:  . Traveled to [place], [place], [place], [place] or [place]; or in the [place] to [place], [place], [place], or [place]; and have fever, cough, and shortness of breath within the last 2 weeks of travel OR . Been in close contact with a person diagnosed with COVID-19 within the last 2 weeks and have fever, cough, and shortness of breath . IF YOU DO NOT MEET THESE CRITERIA, YOU ARE CONSIDERED LOW RISK FOR COVID-19.  What to do if you are HIGH RISK for COVID-19?  Marland Kitchen If you are having a medical emergency, call 911. . Seek medical care right away. Before you go to a doctor's office, urgent care or emergency department, call ahead and tell them  about your recent travel, contact with someone diagnosed with COVID-19, and your symptoms. You should receive instructions from your physician's office regarding next steps of care.  . When you arrive at healthcare provider, tell the healthcare staff immediately you have returned from visiting [place], [place], [place], [place] or [place]; or traveled in the [place] to [place], [place], [place], or [place]; in the last two weeks or you have been in close contact with a person diagnosed with COVID-19 in the last 2 weeks.   . Tell the health care staff about your symptoms: fever, cough and shortness of breath. . After you have been seen by a medical provider, you will be either: o Tested for (COVID-19) and discharged home on quarantine except to seek medical care if symptoms worsen, and asked to  - Stay home and avoid contact with others until you get your results (4-5 days)  - Avoid travel on public transportation if possible (such as bus, train, or airplane) or o Sent to the Emergency Department by EMS for evaluation, COVID-19 testing, and possible admission depending on your condition and test results.  What to do if you are LOW RISK for COVID-19?  Reduce your risk of any infection by using the same precautions used for avoiding the common cold or flu:  Marland Kitchen Wash your hands often with soap and warm water for at least 20 seconds.  If soap and water are not readily available, use an alcohol-based hand sanitizer with at least 60% alcohol.  . If coughing or  sneezing, cover your mouth and nose by coughing or sneezing into the elbow areas of your shirt or coat, into a tissue or into your sleeve (not your hands). . Avoid shaking hands with others and consider head nods or verbal greetings only. . Avoid touching your eyes, nose, or mouth with unwashed hands.  . Avoid close contact with people who are sick. . Avoid places or events with large numbers of people in one location, like concerts or  sporting events. . Carefully consider travel plans you have or are making. . If you are planning any travel outside or inside the [place], visit the CDC's Travelers' Health webpage for the latest health notices. . If you have some symptoms but not all symptoms, continue to monitor at home and seek medical attention if your symptoms worsen. . If you are having a medical emergency, call 911.   [place] / e-Visit: [email or web address]         [facility]: [facility]: [phone]                   [facility]: [phone]

## 2019-02-28 NOTE — Telephone Encounter (Signed)
Ok to refill for 90 days  

## 2019-03-01 ENCOUNTER — Inpatient Hospital Stay: Payer: HMO

## 2019-03-01 ENCOUNTER — Other Ambulatory Visit: Payer: Self-pay

## 2019-03-01 VITALS — BP 137/86 | HR 62 | Temp 98.0°F | Resp 16

## 2019-03-01 DIAGNOSIS — Z5111 Encounter for antineoplastic chemotherapy: Secondary | ICD-10-CM | POA: Diagnosis not present

## 2019-03-01 DIAGNOSIS — C3411 Malignant neoplasm of upper lobe, right bronchus or lung: Secondary | ICD-10-CM

## 2019-03-01 MED ORDER — PEGFILGRASTIM 6 MG/0.6ML ~~LOC~~ PSKT
PREFILLED_SYRINGE | SUBCUTANEOUS | Status: AC
  Filled 2019-03-01: qty 0.6

## 2019-03-01 MED ORDER — DEXAMETHASONE SODIUM PHOSPHATE 10 MG/ML IJ SOLN
10.0000 mg | Freq: Once | INTRAMUSCULAR | Status: AC
  Administered 2019-03-01: 10 mg via INTRAVENOUS

## 2019-03-01 MED ORDER — SODIUM CHLORIDE 0.9 % IV SOLN
100.0000 mg/m2 | Freq: Once | INTRAVENOUS | Status: AC
  Administered 2019-03-01: 180 mg via INTRAVENOUS
  Filled 2019-03-01: qty 9

## 2019-03-01 MED ORDER — SODIUM CHLORIDE 0.9 % IV SOLN
Freq: Once | INTRAVENOUS | Status: AC
  Administered 2019-03-01: 09:00:00 via INTRAVENOUS
  Filled 2019-03-01: qty 250

## 2019-03-01 MED ORDER — PEGFILGRASTIM 6 MG/0.6ML ~~LOC~~ PSKT
6.0000 mg | PREFILLED_SYRINGE | Freq: Once | SUBCUTANEOUS | Status: AC
  Administered 2019-03-01: 6 mg via SUBCUTANEOUS

## 2019-03-01 MED ORDER — DEXAMETHASONE SODIUM PHOSPHATE 10 MG/ML IJ SOLN
INTRAMUSCULAR | Status: AC
  Filled 2019-03-01: qty 1

## 2019-03-01 NOTE — Patient Instructions (Signed)
[place] Discharge Instructions for Patients Receiving Chemotherapy  Today you received the following chemotherapy agents:  Etoposide.  To help prevent nausea and vomiting after your treatment, we encourage you to take your nausea medication as directed.   If you develop nausea and vomiting that is not controlled by your nausea medication, call the clinic.   BELOW ARE SYMPTOMS THAT SHOULD BE REPORTED IMMEDIATELY:  *FEVER GREATER THAN 100.5 F  *CHILLS WITH OR WITHOUT FEVER  NAUSEA AND VOMITING THAT IS NOT CONTROLLED WITH YOUR NAUSEA MEDICATION  *UNUSUAL SHORTNESS OF BREATH  *UNUSUAL BRUISING OR BLEEDING  TENDERNESS IN MOUTH AND THROAT WITH OR WITHOUT PRESENCE OF ULCERS  *URINARY PROBLEMS  *BOWEL PROBLEMS  UNUSUAL RASH Items with * indicate a potential emergency and should be followed up as soon as possible.  Feel free to call the clinic should you have any questions or concerns. The clinic phone number is [phone].  Please show the [place] at check-in to the Emergency Department and triage nurse.

## 2019-03-01 NOTE — Telephone Encounter (Signed)
I am working on this with [facility]

## 2019-03-03 ENCOUNTER — Other Ambulatory Visit: Payer: Self-pay | Admitting: Internal Medicine

## 2019-03-03 ENCOUNTER — Other Ambulatory Visit: Payer: Self-pay | Admitting: Adult Health

## 2019-03-03 ENCOUNTER — Ambulatory Visit: Payer: Self-pay

## 2019-03-03 DIAGNOSIS — Z760 Encounter for issue of repeat prescription: Secondary | ICD-10-CM

## 2019-03-03 DIAGNOSIS — F329 Major depressive disorder, single episode, unspecified: Secondary | ICD-10-CM

## 2019-03-03 DIAGNOSIS — F32A Depression, unspecified: Secondary | ICD-10-CM

## 2019-03-06 ENCOUNTER — Inpatient Hospital Stay: Payer: HMO

## 2019-03-09 ENCOUNTER — Other Ambulatory Visit: Payer: Self-pay

## 2019-03-09 ENCOUNTER — Inpatient Hospital Stay: Payer: HMO

## 2019-03-09 ENCOUNTER — Encounter: Payer: Self-pay | Admitting: General Practice

## 2019-03-09 DIAGNOSIS — Z5111 Encounter for antineoplastic chemotherapy: Secondary | ICD-10-CM | POA: Diagnosis not present

## 2019-03-09 DIAGNOSIS — C3411 Malignant neoplasm of upper lobe, right bronchus or lung: Secondary | ICD-10-CM

## 2019-03-09 LAB — CBC WITH DIFFERENTIAL (CANCER CENTER ONLY)
Abs Immature Granulocytes: 0.2 10*3/uL — ABNORMAL HIGH (ref 0.00–0.07)
Band Neutrophils: 5 %
Basophils Absolute: 0.1 10*3/uL (ref 0.0–0.1)
Basophils Relative: 1 %
Eosinophils Absolute: 0 10*3/uL (ref 0.0–0.5)
Eosinophils Relative: 0 %
HCT: 27.6 % — ABNORMAL LOW (ref 36.0–46.0)
Hemoglobin: 8.5 g/dL — ABNORMAL LOW (ref 12.0–15.0)
Lymphocytes Relative: 62 %
Lymphs Abs: 5.3 10*3/uL — ABNORMAL HIGH (ref 0.7–4.0)
MCH: 27.7 pg (ref 26.0–34.0)
MCHC: 30.8 g/dL (ref 30.0–36.0)
MCV: 89.9 fL (ref 80.0–100.0)
Metamyelocytes Relative: 1 %
Monocytes Absolute: 0.2 10*3/uL (ref 0.1–1.0)
Monocytes Relative: 2 %
Myelocytes: 1 %
Neutro Abs: 2.8 10*3/uL (ref 1.7–17.7)
Neutrophils Relative %: 28 %
Platelet Count: 196 10*3/uL (ref 150–400)
RBC: 3.07 MIL/uL — ABNORMAL LOW (ref 3.87–5.11)
RDW: 20.9 % — ABNORMAL HIGH (ref 11.5–15.5)
WBC Count: 8.5 10*3/uL (ref 4.0–10.5)
nRBC: 0.2 % (ref 0.0–0.2)

## 2019-03-09 LAB — CMP (CANCER CENTER ONLY)
ALT: 14 U/L (ref 0–44)
AST: 16 U/L (ref 15–41)
Albumin: 3.2 g/dL — ABNORMAL LOW (ref 3.5–5.0)
Alkaline Phosphatase: 135 U/L — ABNORMAL HIGH (ref 38–126)
Anion gap: 10 (ref 5–15)
BUN: 19 mg/dL (ref 8–23)
CO2: 22 mmol/L (ref 22–32)
Calcium: 8.6 mg/dL — ABNORMAL LOW (ref 8.9–10.3)
Chloride: 109 mmol/L (ref 98–111)
Creatinine: 1.56 mg/dL — ABNORMAL HIGH (ref 0.44–1.00)
GFR, Est AFR Am: 40 mL/min — ABNORMAL LOW (ref >60)
GFR, Estimated: 34 mL/min — ABNORMAL LOW (ref >60)
Glucose, Bld: 94 mg/dL (ref 70–99)
Potassium: 4.1 mmol/L (ref 3.5–5.1)
Sodium: 141 mmol/L (ref 135–145)
Total Bilirubin: <0.2 mg/dL — ABNORMAL LOW (ref 0.3–1.2)
Total Protein: 6.5 g/dL (ref 6.5–8.1)

## 2019-03-09 NOTE — Patient Outreach (Signed)
  [facility]) Care Management Chronic Special Needs Program    03/09/2019  Name: [name], DOB: 1950/12/03  MRN: [id number]   Ms. Zoriana Farrey is enrolled in a chronic special needs plan for Diabetes. Returned  call from [place] at [facility] to see if [facility] had any contact numbers for client's family as client did not have transportation back home and family did not leave client her wheelchair.  Client usually takes SCAT to appts but today's visit was added on so SCAT could not be used.   States she has made arrangements to loan client a wheelchair and client will take taxi home.  Her apt manager has agreed to help client get in her apt.   Plan to contact client in 1-2 days to follow up with client.  Peter Garter RN, Jackquline Denmark, CDE Chronic Care Management Coordinator [facility] Care Management [phone]

## 2019-03-09 NOTE — Progress Notes (Signed)
[place] CSW Progress Notes  Patient struggling to get ride home - was dropped off for add on appointment by family member - does not have this person's number so she can get a ride home.  Patient's phone is out of battery and she does not know numbers from memory.  In addition, patient does not have her own wheelchair - rode here in car this morning without wheelchair, currently in [facility] wheelchair.  Patient's daughter not answering phone.  Called apartment Freight forwarder, Chrys Racer, at [employer] - she is willing to help patient out of [facility] transport no earlier than 2:45, willing to wheel patient to her apartment.  Patient will be given spare [facility] wheelchair as she does not have her own at the Center.  Practice Administrator aware and agreeable to plan, Lauralee Evener Transport Coordinator helping to facilitate.  CSW [place] asked to help patient develop more adaptive plan for when SCAT transport is not available - if they do not drop patient off for appointments, they will not provide return trip ride.  Appears that patient currently has safe plan to get home after today's visit.  Edwyna Shell, LCSW Clinical Social Worker Phone:  [phone]

## 2019-03-10 ENCOUNTER — Ambulatory Visit: Payer: HMO | Attending: Adult Health | Admitting: Physical Therapy

## 2019-03-14 ENCOUNTER — Encounter (HOSPITAL_COMMUNITY): Payer: Self-pay

## 2019-03-14 ENCOUNTER — Inpatient Hospital Stay: Payer: HMO | Attending: Internal Medicine

## 2019-03-14 ENCOUNTER — Other Ambulatory Visit: Payer: Self-pay

## 2019-03-14 ENCOUNTER — Ambulatory Visit (HOSPITAL_COMMUNITY)
Admission: RE | Admit: 2019-03-14 | Discharge: 2019-03-14 | Disposition: A | Discharge status: Home / Self Care | Payer: HMO | Source: Ambulatory Visit | Attending: Physician Assistant | Admitting: Physician Assistant

## 2019-03-14 DIAGNOSIS — C3411 Malignant neoplasm of upper lobe, right bronchus or lung: Secondary | ICD-10-CM

## 2019-03-14 DIAGNOSIS — I10 Essential (primary) hypertension: Secondary | ICD-10-CM | POA: Insufficient documentation

## 2019-03-14 DIAGNOSIS — R590 Localized enlarged lymph nodes: Secondary | ICD-10-CM | POA: Diagnosis not present

## 2019-03-14 DIAGNOSIS — Z79899 Other long term (current) drug therapy: Secondary | ICD-10-CM | POA: Diagnosis not present

## 2019-03-14 DIAGNOSIS — Z5111 Encounter for antineoplastic chemotherapy: Secondary | ICD-10-CM | POA: Insufficient documentation

## 2019-03-14 DIAGNOSIS — C3412 Malignant neoplasm of upper lobe, left bronchus or lung: Secondary | ICD-10-CM | POA: Diagnosis not present

## 2019-03-14 HISTORY — DX: Malignant (primary) neoplasm, unspecified: C80.1

## 2019-03-14 LAB — CMP (CANCER CENTER ONLY)
ALT: 14 U/L (ref 0–44)
AST: 13 U/L — ABNORMAL LOW (ref 15–41)
Albumin: 3.2 g/dL — ABNORMAL LOW (ref 3.5–5.0)
Alkaline Phosphatase: 132 U/L — ABNORMAL HIGH (ref 38–126)
Anion gap: 11 (ref 5–15)
BUN: 12 mg/dL (ref 8–23)
CO2: 25 mmol/L (ref 22–32)
Calcium: 8.4 mg/dL — ABNORMAL LOW (ref 8.9–10.3)
Chloride: 107 mmol/L (ref 98–111)
Creatinine: 1.34 mg/dL — ABNORMAL HIGH (ref 0.44–1.00)
GFR, Est AFR Am: 48 mL/min — ABNORMAL LOW (ref >60)
GFR, Estimated: 41 mL/min — ABNORMAL LOW (ref >60)
Glucose, Bld: 192 mg/dL — ABNORMAL HIGH (ref 70–99)
Potassium: 3.8 mmol/L (ref 3.5–5.1)
Sodium: 143 mmol/L (ref 135–145)
Total Bilirubin: <0.2 mg/dL — ABNORMAL LOW (ref 0.3–1.2)
Total Protein: 6.5 g/dL (ref 6.5–8.1)

## 2019-03-14 LAB — CBC WITH DIFFERENTIAL (CANCER CENTER ONLY)
Abs Immature Granulocytes: 3.48 10*3/uL — ABNORMAL HIGH (ref 0.00–0.07)
Basophils Absolute: 0.1 10*3/uL (ref 0.0–0.1)
Basophils Relative: 0 %
Eosinophils Absolute: 0.1 10*3/uL (ref 0.0–0.5)
Eosinophils Relative: 0 %
HCT: 27.9 % — ABNORMAL LOW (ref 36.0–46.0)
Hemoglobin: 8.7 g/dL — ABNORMAL LOW (ref 12.0–15.0)
Immature Granulocytes: 14 %
Lymphocytes Relative: 13 %
Lymphs Abs: 3.3 10*3/uL (ref 0.7–4.0)
MCH: 27.8 pg (ref 26.0–34.0)
MCHC: 31.2 g/dL (ref 30.0–36.0)
MCV: 89.1 fL (ref 80.0–100.0)
Monocytes Absolute: 1.1 10*3/uL — ABNORMAL HIGH (ref 0.1–1.0)
Monocytes Relative: 4 %
Neutro Abs: 17.5 10*3/uL — ABNORMAL HIGH (ref 1.7–7.7)
Neutrophils Relative %: 69 %
Platelet Count: 102 10*3/uL — ABNORMAL LOW (ref 150–400)
RBC: 3.13 MIL/uL — ABNORMAL LOW (ref 3.87–5.11)
RDW: 20.8 % — ABNORMAL HIGH (ref 11.5–15.5)
WBC Count: 25.5 10*3/uL — ABNORMAL HIGH (ref 4.0–10.5)
nRBC: 0.3 % — ABNORMAL HIGH (ref 0.0–0.2)

## 2019-03-17 ENCOUNTER — Telehealth: Payer: Self-pay | Admitting: *Deleted

## 2019-03-17 NOTE — Telephone Encounter (Signed)
Received TC from patient. She states someone from cancer center called her.  She didn't know what it was about. Informed pt that it was reminder call about her appts on Tuesday, 03/21/19.  She tought the appts were on Monday.  Reviewed appts with her x3. She voiced understanding

## 2019-03-21 ENCOUNTER — Inpatient Hospital Stay: Payer: HMO

## 2019-03-21 ENCOUNTER — Other Ambulatory Visit: Payer: Self-pay

## 2019-03-21 ENCOUNTER — Encounter: Payer: Self-pay | Admitting: Physician Assistant

## 2019-03-21 ENCOUNTER — Telehealth: Payer: Self-pay

## 2019-03-21 ENCOUNTER — Inpatient Hospital Stay (HOSPITAL_BASED_OUTPATIENT_CLINIC_OR_DEPARTMENT_OTHER): Payer: HMO | Admitting: Physician Assistant

## 2019-03-21 VITALS — BP 171/79 | HR 66 | Temp 98.9°F | Resp 18 | Ht 65.0 in | Wt 165.1 lb

## 2019-03-21 VITALS — BP 141/71

## 2019-03-21 DIAGNOSIS — C3411 Malignant neoplasm of upper lobe, right bronchus or lung: Secondary | ICD-10-CM

## 2019-03-21 DIAGNOSIS — R590 Localized enlarged lymph nodes: Secondary | ICD-10-CM

## 2019-03-21 DIAGNOSIS — I10 Essential (primary) hypertension: Secondary | ICD-10-CM

## 2019-03-21 DIAGNOSIS — Z5111 Encounter for antineoplastic chemotherapy: Secondary | ICD-10-CM

## 2019-03-21 DIAGNOSIS — C3412 Malignant neoplasm of upper lobe, left bronchus or lung: Secondary | ICD-10-CM | POA: Diagnosis not present

## 2019-03-21 DIAGNOSIS — Z79899 Other long term (current) drug therapy: Secondary | ICD-10-CM

## 2019-03-21 LAB — CMP (CANCER CENTER ONLY)
ALT: 25 U/L (ref 0–44)
AST: 16 U/L (ref 15–41)
Albumin: 3.2 g/dL — ABNORMAL LOW (ref 3.5–5.0)
Alkaline Phosphatase: 122 U/L (ref 38–126)
Anion gap: 9 (ref 5–15)
BUN: 21 mg/dL (ref 8–23)
CO2: 23 mmol/L (ref 22–32)
Calcium: 8.6 mg/dL — ABNORMAL LOW (ref 8.9–10.3)
Chloride: 108 mmol/L (ref 98–111)
Creatinine: 1.53 mg/dL — ABNORMAL HIGH (ref 0.44–1.00)
GFR, Est AFR Am: 41 mL/min — ABNORMAL LOW (ref >60)
GFR, Estimated: 35 mL/min — ABNORMAL LOW (ref >60)
Glucose, Bld: 229 mg/dL — ABNORMAL HIGH (ref 70–99)
Potassium: 4.3 mmol/L (ref 3.5–5.1)
Sodium: 140 mmol/L (ref 135–145)
Total Bilirubin: <0.2 mg/dL — ABNORMAL LOW (ref 0.3–1.2)
Total Protein: 6.6 g/dL (ref 6.5–8.1)

## 2019-03-21 LAB — CBC WITH DIFFERENTIAL (CANCER CENTER ONLY)
Abs Immature Granulocytes: 0.08 10*3/uL — ABNORMAL HIGH (ref 0.00–0.07)
Basophils Absolute: 0.1 10*3/uL (ref 0.0–0.1)
Basophils Relative: 1 %
Eosinophils Absolute: 0.1 10*3/uL (ref 0.0–0.5)
Eosinophils Relative: 1 %
HCT: 29.3 % — ABNORMAL LOW (ref 36.0–46.0)
Hemoglobin: 8.8 g/dL — ABNORMAL LOW (ref 12.0–15.0)
Immature Granulocytes: 1 %
Lymphocytes Relative: 22 %
Lymphs Abs: 2.3 10*3/uL (ref 0.7–4.0)
MCH: 27.9 pg (ref 26.0–34.0)
MCHC: 30 g/dL (ref 30.0–36.0)
MCV: 93 fL (ref 80.0–100.0)
Monocytes Absolute: 0.7 10*3/uL (ref 0.1–1.0)
Monocytes Relative: 6 %
Neutro Abs: 7.4 10*3/uL (ref 1.7–7.7)
Neutrophils Relative %: 69 %
Platelet Count: 366 10*3/uL (ref 150–400)
RBC: 3.15 MIL/uL — ABNORMAL LOW (ref 3.87–5.11)
RDW: 22.7 % — ABNORMAL HIGH (ref 11.5–15.5)
WBC Count: 10.6 10*3/uL — ABNORMAL HIGH (ref 4.0–10.5)
nRBC: 0.8 % — ABNORMAL HIGH (ref 0.0–0.2)

## 2019-03-21 MED ORDER — CLONIDINE HCL 0.1 MG PO TABS
ORAL_TABLET | ORAL | Status: AC
  Filled 2019-03-21: qty 1

## 2019-03-21 MED ORDER — CLONIDINE HCL 0.1 MG PO TABS
0.2000 mg | ORAL_TABLET | Freq: Once | ORAL | Status: AC
  Administered 2019-03-21: 10:00:00 0.2 mg via ORAL

## 2019-03-21 MED ORDER — SODIUM CHLORIDE 0.9 % IV SOLN
Freq: Once | INTRAVENOUS | Status: AC
  Administered 2019-03-21: 10:00:00 via INTRAVENOUS
  Filled 2019-03-21: qty 250

## 2019-03-21 MED ORDER — PALONOSETRON HCL INJECTION 0.25 MG/5ML
0.2500 mg | Freq: Once | INTRAVENOUS | Status: AC
  Administered 2019-03-21: 10:00:00 0.25 mg via INTRAVENOUS

## 2019-03-21 MED ORDER — SODIUM CHLORIDE 0.9 % IV SOLN
357.5000 mg | Freq: Once | INTRAVENOUS | Status: AC
  Administered 2019-03-21: 12:00:00 360 mg via INTRAVENOUS
  Filled 2019-03-21: qty 36

## 2019-03-21 MED ORDER — SODIUM CHLORIDE 0.9 % IV SOLN
100.0000 mg/m2 | Freq: Once | INTRAVENOUS | Status: AC
  Administered 2019-03-21: 12:00:00 180 mg via INTRAVENOUS
  Filled 2019-03-21: qty 9

## 2019-03-21 MED ORDER — SODIUM CHLORIDE 0.9 % IV SOLN
Freq: Once | INTRAVENOUS | Status: AC
  Administered 2019-03-21: 11:00:00 via INTRAVENOUS
  Filled 2019-03-21: qty 5

## 2019-03-21 MED ORDER — PALONOSETRON HCL INJECTION 0.25 MG/5ML
INTRAVENOUS | Status: AC
  Filled 2019-03-21: qty 5

## 2019-03-21 NOTE — Telephone Encounter (Signed)
Nutrition  RD working remotely.  Called patient for nutrition follow-up.  No answer. Left message for patient to return call.  Jakobee Brackins B. Zenia Resides, [place], [place] Registered Dietitian [phone] (pager)

## 2019-03-21 NOTE — Progress Notes (Signed)
[facility] to [facility] w/ Creatinine 1.53 per C. Heilingoetter PA

## 2019-03-21 NOTE — Patient Instructions (Signed)
[facility] Discharge Instructions for Patients Receiving Chemotherapy  Today you received the following chemotherapy agents: Carboplatin and Etoposide.   To help prevent nausea and vomiting after your treatment, we encourage you to take your nausea medication as directed.    If you develop nausea and vomiting that is not controlled by your nausea medication, call the clinic.   BELOW ARE SYMPTOMS THAT SHOULD BE REPORTED IMMEDIATELY:  *FEVER GREATER THAN 100.5 F  *CHILLS WITH OR WITHOUT FEVER  NAUSEA AND VOMITING THAT IS NOT CONTROLLED WITH YOUR NAUSEA MEDICATION  *UNUSUAL SHORTNESS OF BREATH  *UNUSUAL BRUISING OR BLEEDING  TENDERNESS IN MOUTH AND THROAT WITH OR WITHOUT PRESENCE OF ULCERS  *URINARY PROBLEMS  *BOWEL PROBLEMS  UNUSUAL RASH Items with * indicate a potential emergency and should be followed up as soon as possible.  Feel free to call the clinic should you have any questions or concerns. The clinic phone number is [phone].  Please show the [place] at check-in to the Emergency Department and triage nurse.

## 2019-03-21 NOTE — Progress Notes (Signed)
Per Cassie Heilingoetter, PA ok to treat with elevated blood pressure today 03/21/2019.  SEE MAR for one time dose of Clonidine.  Infusion nurse notified.

## 2019-03-21 NOTE — Progress Notes (Signed)
[facility] OFFICE PROGRESS NOTE  Tara Peng, NP [place] [address]  DIAGNOSIS: Limited stage (T1c, N2, M0) small cell lung cancer presented with left upper lobe lung nodule in addition to mediastinal lymphadenopathy diagnosed in February 2020.  PRIOR THERAPY: None  CURRENT THERAPY: Systemic chemotherapy with carboplatin for AUC of 5 on day 1 and etoposide at 100 mg/M2 on days 1, 2 and 3 with Neulasta support every 3 weeks. First dose January 16, 2019. Status post cycle 3.  INTERVAL HISTORY: Tara Hopkins 68 y.o. female returns to the clinic today for a follow-up visit.  The patient is feeling well today without any concerning complaints.  She tolerated her last treatment well without any adverse effects. In the past, she had nausea and diarrhea with treatment but she did not experience these symptoms with the last cycle. She has transportation difficulties and is working closely with our Education officer, museum and transportation services.  The patient is very appreciative of their assistance. The patient was also recently switched to Onpro due to the current COVID pandemic which is also beneficial for her because she has transportation difficulties. She said that she "loves" Onpro and that transitioning to that was very convenient for her.   The patient denies any fever, chills, night sweats, or weight loss.  She denies any chest pain, shortness of breath, cough, or hemoptysis.  She denies any nausea, vomiting, diarrhea, or constipation.  She denies any headache or visual changes.  She recently had a restaging CT scan performed.  She is here today for evaluation and to discuss her scan results prior to starting cycle #4.  MEDICAL HISTORY: Past Medical History:  Diagnosis Date  . Anemia   . Anxiety   . Bipolar 1 disorder ([place])   . Cataracts, bilateral   . Depression   . Diabetes mellitus without complication ([facility])   . Glaucoma   . History of blood  transfusion   . History of hyperbaric oxygen therapy    pt reports 80 treatments.  . Hypercholesteremia   . Hypertension   . Mediastinal adenopathy   . Peripheral neuropathy   . Pneumonia   . SCL ca Dx'd 12/2018  . Stroke ([place]) 02/2015  . Wears glasses     ALLERGIES:  has No Known Allergies.  MEDICATIONS:  Current Outpatient Medications  Medication Sig Dispense Refill  . amLODipine (NORVASC) 5 MG tablet TAKE ONE TABLET BY MOUTH DAILY 90 tablet 0  . aspirin 325 MG EC tablet Take 1 tablet (325 mg total) by mouth daily. 30 tablet 0  . aspirin EC 81 MG tablet Take 81 mg by mouth daily.    Marland Kitchen atorvastatin (LIPITOR) 40 MG tablet TAKE ONE TABLET BY MOUTH DAILY AT 6 PM 90 tablet 0  . clonazePAM (KLONOPIN) 0.5 MG tablet TAKE 1 TABLET BY MOUTH THREE TIMES A DAY 90 tablet 0  . gabapentin (NEURONTIN) 400 MG capsule TAKE 3 CAPSULES BY MOUTH THREE TIMES A DAY (Patient taking differently: Take 1,200 mg by mouth 3 (three) times daily. TAKE 3 CAPSULES BY MOUTH THREE TIMES A DAY) 270 capsule 2  . glimepiride (AMARYL) 4 MG tablet Take 1 tablet (4 mg total) by mouth daily with breakfast. 90 tablet 0  . glucose blood (ONETOUCH VERIO) test strip USE TO TEST BLOOD GLUCOSE 3 TIMES DAILY 100 each 12  . Insulin Glargine (LANTUS SOLOSTAR) 100 UNIT/ML Solostar Pen Inject 25 Units into the skin at bedtime. (Patient taking differently: Inject 40 Units  into the skin at bedtime. ) 15 mL 6  . Insulin Pen Needle (B-D UF III MINI PEN NEEDLES) 31G X 5 MM MISC Use as directed. 100 each 3  . latanoprost (XALATAN) 0.005 % ophthalmic solution INSTILL 1 DROP INTO BOTH EYES EVERY EVENING (Patient taking differently: Place 1 drop into both eyes at bedtime. ) 7.5 mL 0  . lisinopril (PRINIVIL,ZESTRIL) 40 MG tablet Take 1 tablet (40 mg total) by mouth daily. 90 tablet 1  . ONE TOUCH LANCETS MISC USE TO CHECK BLOOD SUGAR 3 TIMES DAILY 200 each 3  . prochlorperazine (COMPAZINE) 10 MG tablet TAKE ONE TABLET BY MOUTH EVERY 6 HOURS AS  NEEDED FOR NAUSEA OR VOMITING 30 tablet 0  . citalopram (CELEXA) 10 MG tablet Take 1 tablet (10 mg total) by mouth daily. 90 tablet 1   Current Facility-Administered Medications  Medication Dose Route Frequency Provider Last Rate Last Dose  . cloNIDine (CATAPRES) tablet 0.2 mg  0.2 mg Oral Once ,  L, PA-C        SURGICAL HISTORY:  Past Surgical History:  Procedure Laterality Date  . AMPUTATION Right 12/15/2013   Procedure: Right Transmetatarsal Amputation;  Surgeon: Newt Minion, MD;  Location: [facility];  Service: Orthopedics;  Laterality: Right;  . AMPUTATION Right 02/23/2014   Procedure: AMPUTATION FOOT;  Surgeon: Newt Minion, MD;  Location: [facility];  Service: Orthopedics;  Laterality: Right;  Right Foot Revision Transmetatarsal Amputation  . CATARACT EXTRACTION W/ INTRAOCULAR LENS  IMPLANT, BILATERAL    . COLONOSCOPY W/ BIOPSIES AND POLYPECTOMY    . I&D EXTREMITY Right 05/23/2013   Procedure: IRRIGATION AND DEBRIDEMENT RIGHT GREAT TOE;  Surgeon: Mauri Pole, MD;  Location: [facility];  Service: Orthopedics;  Laterality: Right;  . TUBAL LIGATION    . VIDEO BRONCHOSCOPY WITH ENDOBRONCHIAL ULTRASOUND N/A 12/22/2018   Procedure: VIDEO BRONCHOSCOPY WITH ENDOBRONCHIAL ULTRASOUND;  Surgeon: [name], DO;  Location: [facility];  Service: Thoracic;  Laterality: N/A;    REVIEW OF SYSTEMS:   Review of Systems  Constitutional: Negative for appetite change, chills, fatigue, fever and unexpected weight change.  HENT:   Negative for mouth sores, nosebleeds, sore throat and trouble swallowing.   Eyes: Negative for eye problems and icterus.  Respiratory: Negative for cough, hemoptysis, shortness of breath and wheezing.   Cardiovascular: Negative for chest pain and leg swelling.  Gastrointestinal: Negative for abdominal pain, constipation, diarrhea, nausea and vomiting.  Genitourinary: Negative for bladder incontinence, difficulty urinating, dysuria, frequency and hematuria.    Musculoskeletal: Negative for back pain, gait problem, neck pain and neck stiffness.  Skin: Negative for itching and rash.  Neurological: Negative for dizziness, extremity weakness, gait problem, headaches, light-headedness and seizures.  Hematological: Negative for adenopathy. Does not bruise/bleed easily.  Psychiatric/Behavioral: Negative for confusion, depression and sleep disturbance. The patient is not nervous/anxious.     PHYSICAL EXAMINATION:  Blood pressure (!) 171/79, pulse 66, temperature 98.9 F (37.2 C), temperature source Oral, resp. rate 18, height 5\' 5"  (1.651 m), weight 165 lb 1.6 oz (74.9 kg), SpO2 100 %.  ECOG PERFORMANCE STATUS: 2 - Symptomatic, <50% confined to bed  Physical Exam  Constitutional: Oriented to person, place, and time and well-developed, well-nourished, and in no distress. The patient was examined in the wheelchair.  HENT:  Head: Normocephalic and atraumatic.  Mouth/Throat: Oropharynx is clear and moist. No oropharyngeal exudate.  Eyes: Conjunctivae are normal. Right eye exhibits no discharge. Left eye exhibits no discharge. No scleral icterus.  Neck: Normal range of motion. Neck supple.  Cardiovascular: Normal rate, regular rhythm, normal heart sounds and intact distal pulses.   Pulmonary/Chest: Effort normal and breath sounds normal. No respiratory distress. No wheezes. No rales.  Abdominal: Soft. Bowel sounds are normal. Exhibits no distension and no mass. There is no tenderness.  Musculoskeletal: Right Transmetatarsal Amputation. Normal range of motion. Exhibits no edema.  Lymphadenopathy:    No cervical adenopathy.  Neurological: Alert and oriented to person, place, and time. Exhibits normal muscle tone. Gait normal. Coordination normal.  Skin: Skin is warm and dry. No rash noted. Not diaphoretic. No erythema. No pallor.  Psychiatric: Mood, memory and judgment normal.  Vitals reviewed.  LABORATORY DATA: Lab Results  Component Value Date   WBC  10.6 (H) 03/21/2019   HGB 8.8 (L) 03/21/2019   HCT 29.3 (L) 03/21/2019   MCV 93.0 03/21/2019   PLT 366 03/21/2019      Chemistry      Component Value Date/Time   NA 143 03/14/2019 0836   K 3.8 03/14/2019 0836   CL 107 03/14/2019 0836   CO2 25 03/14/2019 0836   BUN 12 03/14/2019 0836   CREATININE 1.34 (H) 03/14/2019 0836      Component Value Date/Time   CALCIUM 8.4 (L) 03/14/2019 0836   ALKPHOS 132 (H) 03/14/2019 0836   AST 13 (L) 03/14/2019 0836   ALT 14 03/14/2019 0836   BILITOT <0.2 (L) 03/14/2019 0836       RADIOGRAPHIC STUDIES:  Ct Chest Wo Contrast  Result Date: 03/14/2019 CLINICAL DATA:  Small-cell lung cancer EXAM: CT CHEST WITHOUT CONTRAST TECHNIQUE: Multidetector CT imaging of the chest was performed following the standard protocol without IV contrast. COMPARISON:  PET-CT 12/01/2018.  Chest CT 10/28/2018. FINDINGS: Cardiovascular: The heart size is normal. No substantial pericardial effusion. Coronary artery calcification is evident. Atherosclerotic calcification is noted in the wall of the thoracic aorta. Mediastinum/Nodes: 1.5 cm short axis prevascular lymph node measured on the previous exam has decreased to 0.5 cm in the interval. Index right paratracheal node measured previously at 2.1 cm is now 1.2 cm short axis. 2.8 cm short axis subcarinal lymph node measured previously has decreased in the interval, now measuring 2.1 cm. No bulky hilar lymphadenopathy evident although assessment limited by lack of intravenous contrast material. The esophagus has normal imaging features. There is no axillary lymphadenopathy. Lungs/Pleura: The central tracheobronchial airways are patent. Centrilobular emphsyema noted. Mild bronchiectasis noted right middle lobe, lingula, and both lower lobes.10 mm posterior right upper lobe pulmonary nodule identified on the previous study has decreased, measuring 4 mm today (58/7). No new suspicious nodule or mass. No pleural effusion. Upper Abdomen: 9  mm left adrenal nodule is similar to prior PET-CT. No evidence for hypermetabolism on that study. Musculoskeletal: No worrisome lytic or sclerotic osseous abnormality. IMPRESSION: 1. Interval decrease in mediastinal lymphadenopathy. 2. Interval decrease in the posterior right upper lobe pulmonary nodule. 3. Stable 9 mm left adrenal nodule, likely adenoma. 4. No new or progressive findings on today's study. 5.  Aortic Atherosclerois (ICD10-170.0) 6.  Emphysema. ([id number]) Electronically Signed   By: Misty Stanley M.D.   On: 03/14/2019 13:21     ASSESSMENT/PLAN:  This is a very pleasant 68 year old African-American female recently diagnosed with limited stage (T1c, N2, M0) small cell lung cancer who presented with a right upper lobe lung nodule in addition to mediastinal lymphadenopathy.  She was diagnosed in February 2020. She is currently undergoing chemotherapy with carboplatin for an AUC of  5 on days 1 and etoposide 100 mg/m2 on days 1, 2, and 3 with Neulasta support every 3 weeks.  Status post 3 cycles.  The patient experienced nausea, vomiting, and diarrhea after the first cycle of treatment.  Since her first cycle of treatment, the patient has been using Compazine and Imodium. She also had adjustments to her premedications per pharmacy recommendation. She denies any adverse effects such as nausea, vomiting, or diarrhea with her second cycle of treatment. She missed her Udenyca injection after cycle 2 due to difficulties obtaining transportation. She was then switched to Onpro due to convenience and the COVID pandemic.   The patient recently had a restaging CT scan performed.  Dr. Julien Nordmann personally and independently reviewed the scan and discussed the results with the patient today.  The scan showed a response to treatment with a decrease in the size of mediastinal lymphadenopathy and posterior right upper lobe pulmonary nodule. He recommends that she proceed with cycle #4 today as scheduled.  We  will see her back for follow-up visit in 3 weeks for evaluation prior to starting cycle #5.   The patient's blood pressure was noted to be elevated today at 171/79. The patient was given a 1x dose of clonidine in the clinic today.   The patient was advised to call immediately if she has any concerning symptoms in the interval. The patient voices understanding of current disease status and treatment options and is in agreement with the current care plan. All questions were answered. The patient knows to call the clinic with any problems, questions or concerns. We can certainly see the patient much sooner if necessary  No orders of the defined types were placed in this encounter.     L , PA-C 03/21/19  ADDENDUM: Hematology/Oncology Attending: I had a face-to-face encounter with the patient today.  I recommended her care plan.  This is a very pleasant 68 years old African-American female with limited stage small cell lung cancer and she is currently undergoing systemic chemotherapy with carboplatin and etoposide status post 3 cycles.  The patient is tolerating this treatment much better.  She denied having any current nausea, vomiting or diarrhea. She had repeat CT scan of the chest performed recently.  I personally and independently reviewed the scans and discussed the results with the patient today. Her scan showed improvement of her disease with decrease in the size of the mediastinal lymphadenopathy as well as the right upper lobe pulmonary nodule. I recommended for the patient to continue her current treatment with carboplatin and etoposide and she will proceed with cycle #4 today. She will come back for follow-up visit in 3 weeks for evaluation before the next cycle of her treatment. For hypertension she was advised to take her blood pressure medication as prescribed and we will give the patient a dose of clonidine 0.2 mg p.o. x1. She was advised to call immediately if  she has any concerning symptoms in the interval.  Disclaimer: This note was dictated with voice recognition software. Similar sounding words can inadvertently be transcribed and may be missed upon review. Eilleen Kempf, MD 03/21/19

## 2019-03-22 ENCOUNTER — Telehealth: Payer: Self-pay | Admitting: Physician Assistant

## 2019-03-22 ENCOUNTER — Other Ambulatory Visit: Payer: Self-pay

## 2019-03-22 ENCOUNTER — Inpatient Hospital Stay: Payer: HMO

## 2019-03-22 VITALS — BP 143/66 | HR 60 | Temp 98.3°F | Resp 18

## 2019-03-22 DIAGNOSIS — C3411 Malignant neoplasm of upper lobe, right bronchus or lung: Secondary | ICD-10-CM

## 2019-03-22 DIAGNOSIS — Z5111 Encounter for antineoplastic chemotherapy: Secondary | ICD-10-CM | POA: Diagnosis not present

## 2019-03-22 MED ORDER — DEXAMETHASONE SODIUM PHOSPHATE 10 MG/ML IJ SOLN
10.0000 mg | Freq: Once | INTRAMUSCULAR | Status: AC
  Administered 2019-03-22: 10 mg via INTRAVENOUS

## 2019-03-22 MED ORDER — SODIUM CHLORIDE 0.9 % IV SOLN
Freq: Once | INTRAVENOUS | Status: AC
  Administered 2019-03-22: 09:00:00 via INTRAVENOUS
  Filled 2019-03-22: qty 250

## 2019-03-22 MED ORDER — SODIUM CHLORIDE 0.9 % IV SOLN
100.0000 mg/m2 | Freq: Once | INTRAVENOUS | Status: AC
  Administered 2019-03-22: 10:00:00 180 mg via INTRAVENOUS
  Filled 2019-03-22: qty 9

## 2019-03-22 MED ORDER — DEXAMETHASONE SODIUM PHOSPHATE 10 MG/ML IJ SOLN
INTRAMUSCULAR | Status: AC
  Filled 2019-03-22: qty 1

## 2019-03-22 NOTE — Patient Instructions (Signed)
[place] Discharge Instructions for Patients Receiving Chemotherapy  Today you received the following chemotherapy agents:  Etoposide.  To help prevent nausea and vomiting after your treatment, we encourage you to take your nausea medication as directed.   If you develop nausea and vomiting that is not controlled by your nausea medication, call the clinic.   BELOW ARE SYMPTOMS THAT SHOULD BE REPORTED IMMEDIATELY:  *FEVER GREATER THAN 100.5 F  *CHILLS WITH OR WITHOUT FEVER  NAUSEA AND VOMITING THAT IS NOT CONTROLLED WITH YOUR NAUSEA MEDICATION  *UNUSUAL SHORTNESS OF BREATH  *UNUSUAL BRUISING OR BLEEDING  TENDERNESS IN MOUTH AND THROAT WITH OR WITHOUT PRESENCE OF ULCERS  *URINARY PROBLEMS  *BOWEL PROBLEMS  UNUSUAL RASH Items with * indicate a potential emergency and should be followed up as soon as possible.  Feel free to call the clinic should you have any questions or concerns. The clinic phone number is [phone].  Please show the [place] at check-in to the Emergency Department and triage nurse.

## 2019-03-22 NOTE — Telephone Encounter (Signed)
Scheduled appt per 5/12 los - pt to get an updated schedule next visit.

## 2019-03-23 ENCOUNTER — Other Ambulatory Visit: Payer: Self-pay | Admitting: Adult Health

## 2019-03-23 ENCOUNTER — Inpatient Hospital Stay: Payer: HMO

## 2019-03-23 ENCOUNTER — Encounter: Payer: Self-pay | Admitting: General Practice

## 2019-03-23 ENCOUNTER — Other Ambulatory Visit: Payer: Self-pay

## 2019-03-23 VITALS — BP 153/74 | HR 60 | Temp 98.2°F | Resp 18 | Ht 65.0 in

## 2019-03-23 DIAGNOSIS — C3411 Malignant neoplasm of upper lobe, right bronchus or lung: Secondary | ICD-10-CM

## 2019-03-23 DIAGNOSIS — Z5111 Encounter for antineoplastic chemotherapy: Secondary | ICD-10-CM | POA: Diagnosis not present

## 2019-03-23 DIAGNOSIS — Z760 Encounter for issue of repeat prescription: Secondary | ICD-10-CM

## 2019-03-23 MED ORDER — SODIUM CHLORIDE 0.9 % IV SOLN
100.0000 mg/m2 | Freq: Once | INTRAVENOUS | Status: AC
  Administered 2019-03-23: 10:00:00 180 mg via INTRAVENOUS
  Filled 2019-03-23: qty 9

## 2019-03-23 MED ORDER — DEXAMETHASONE SODIUM PHOSPHATE 10 MG/ML IJ SOLN
INTRAMUSCULAR | Status: AC
  Filled 2019-03-23: qty 1

## 2019-03-23 MED ORDER — SODIUM CHLORIDE 0.9 % IV SOLN
Freq: Once | INTRAVENOUS | Status: AC
  Administered 2019-03-23: 09:00:00 via INTRAVENOUS
  Filled 2019-03-23: qty 250

## 2019-03-23 MED ORDER — DEXAMETHASONE SODIUM PHOSPHATE 10 MG/ML IJ SOLN
10.0000 mg | Freq: Once | INTRAMUSCULAR | Status: AC
  Administered 2019-03-23: 10 mg via INTRAVENOUS

## 2019-03-23 MED ORDER — PEGFILGRASTIM 6 MG/0.6ML ~~LOC~~ PSKT
6.0000 mg | PREFILLED_SYRINGE | Freq: Once | SUBCUTANEOUS | Status: AC
  Administered 2019-03-23: 11:00:00 6 mg via SUBCUTANEOUS

## 2019-03-23 MED ORDER — PEGFILGRASTIM 6 MG/0.6ML ~~LOC~~ PSKT
PREFILLED_SYRINGE | SUBCUTANEOUS | Status: AC
  Filled 2019-03-23: qty 0.6

## 2019-03-23 NOTE — Progress Notes (Signed)
[facility] called for patient pick up.

## 2019-03-23 NOTE — Patient Instructions (Signed)
[facility] Discharge Instructions for Patients Receiving Chemotherapy  Today you received the following chemotherapy agents Etoposide (VEPESID).  To help prevent nausea and vomiting after your treatment, we encourage you to take your nausea medication as prescribed.   If you develop nausea and vomiting that is not controlled by your nausea medication, call the clinic.   BELOW ARE SYMPTOMS THAT SHOULD BE REPORTED IMMEDIATELY:  *FEVER GREATER THAN 100.5 F  *CHILLS WITH OR WITHOUT FEVER  NAUSEA AND VOMITING THAT IS NOT CONTROLLED WITH YOUR NAUSEA MEDICATION  *UNUSUAL SHORTNESS OF BREATH  *UNUSUAL BRUISING OR BLEEDING  TENDERNESS IN MOUTH AND THROAT WITH OR WITHOUT PRESENCE OF ULCERS  *URINARY PROBLEMS  *BOWEL PROBLEMS  UNUSUAL RASH Items with * indicate a potential emergency and should be followed up as soon as possible.  Feel free to call the clinic should you have any questions or concerns. The clinic phone number is [phone].  Please show the [facility] at check-in to the Emergency Department and triage nurse.  Pegfilgrastim injection What is this medicine? PEGFILGRASTIM (PEG fil gra stim) is a long-acting granulocyte colony-stimulating factor that stimulates the growth of neutrophils, a type of white blood cell important in the body's fight against infection. It is used to reduce the incidence of fever and infection in patients with certain types of cancer who are receiving chemotherapy that affects the bone marrow, and to increase survival after being exposed to high doses of radiation. This medicine may be used for other purposes; ask your health care provider or pharmacist if you have questions. COMMON BRAND NAME(S): Domenic Moras, UDENYCA What should I tell my health care provider before I take this medicine? They need to know if you have any of these conditions: -kidney disease -latex allergy -ongoing radiation therapy -sickle cell  disease -skin reactions to acrylic adhesives (On-Body Injector only) -an unusual or allergic reaction to pegfilgrastim, filgrastim, other medicines, foods, dyes, or preservatives -pregnant or trying to get pregnant -breast-feeding How should I use this medicine? This medicine is for injection under the skin. If you get this medicine at home, you will be taught how to prepare and give the pre-filled syringe or how to use the On-body Injector. Refer to the patient Instructions for Use for detailed instructions. Use exactly as directed. Tell your healthcare provider immediately if you suspect that the On-body Injector may not have performed as intended or if you suspect the use of the On-body Injector resulted in a missed or partial dose. It is important that you put your used needles and syringes in a special sharps container. Do not put them in a trash can. If you do not have a sharps container, call your pharmacist or healthcare provider to get one. Talk to your pediatrician regarding the use of this medicine in children. While this drug may be prescribed for selected conditions, precautions do apply. Overdosage: If you think you have taken too much of this medicine contact a poison control center or emergency room at once. NOTE: This medicine is only for you. Do not share this medicine with others. What if I miss a dose? It is important not to miss your dose. Call your doctor or health care professional if you miss your dose. If you miss a dose due to an On-body Injector failure or leakage, a new dose should be administered as soon as possible using a single prefilled syringe for manual use. What may interact with this medicine? Interactions have not been studied.  Give your health care provider a list of all the medicines, herbs, non-prescription drugs, or dietary supplements you use. Also tell them if you smoke, drink alcohol, or use illegal drugs. Some items may interact with your medicine. This  list may not describe all possible interactions. Give your health care provider a list of all the medicines, herbs, non-prescription drugs, or dietary supplements you use. Also tell them if you smoke, drink alcohol, or use illegal drugs. Some items may interact with your medicine. What should I watch for while using this medicine? You may need blood work done while you are taking this medicine. If you are going to need a MRI, CT scan, or other procedure, tell your doctor that you are using this medicine (On-Body Injector only). What side effects may I notice from receiving this medicine? Side effects that you should report to your doctor or health care professional as soon as possible: -allergic reactions like skin rash, itching or hives, swelling of the face, lips, or tongue -back pain -dizziness -fever -pain, redness, or irritation at site where injected -pinpoint red spots on the skin -red or dark-brown urine -shortness of breath or breathing problems -stomach or side pain, or pain at the shoulder -swelling -tiredness -trouble passing urine or change in the amount of urine Side effects that usually do not require medical attention (report to your doctor or health care professional if they continue or are bothersome): -bone pain -muscle pain This list may not describe all possible side effects. Call your doctor for medical advice about side effects. You may report side effects to FDA at 1-800-FDA-1088. Where should I keep my medicine? Keep out of the reach of children. If you are using this medicine at home, you will be instructed on how to store it. Throw away any unused medicine after the expiration date on the label. NOTE: This sheet is a summary. It may not cover all possible information. If you have questions about this medicine, talk to your doctor, pharmacist, or health care provider.  2019 Elsevier/Gold Standard (2018-01-31 16:57:08)  Coronavirus (COVID-19) Are you at  risk?  Are you at risk for the Coronavirus (COVID-19)?  To be considered HIGH RISK for Coronavirus (COVID-19), you have to meet the following criteria:  . Traveled to [place], [place], [place], [place] or [place]; or in the [place] to [place], [place], [place], or [place]; and have fever, cough, and shortness of breath within the last 2 weeks of travel OR . Been in close contact with a person diagnosed with COVID-19 within the last 2 weeks and have fever, cough, and shortness of breath . IF YOU DO NOT MEET THESE CRITERIA, YOU ARE CONSIDERED LOW RISK FOR COVID-19.  What to do if you are HIGH RISK for COVID-19?  Marland Kitchen If you are having a medical emergency, call 911. . Seek medical care right away. Before you go to a doctor's office, urgent care or emergency department, call ahead and tell them about your recent travel, contact with someone diagnosed with COVID-19, and your symptoms. You should receive instructions from your physician's office regarding next steps of care.  . When you arrive at healthcare provider, tell the healthcare staff immediately you have returned from visiting [place], [place], [place], [place] or [place]; or traveled in the [place] to [place], [place], [place], or [place]; in the last two weeks or you have been in close contact with a person diagnosed with COVID-19 in the last 2 weeks.   Marland Kitchen  Tell the health care staff about your symptoms: fever, cough and shortness of breath. . After you have been seen by a medical provider, you will be either: o Tested for (COVID-19) and discharged home on quarantine except to seek medical care if symptoms worsen, and asked to  - Stay home and avoid contact with others until you get your results (4-5 days)  - Avoid travel on public transportation if possible (such as bus, train, or airplane) or o Sent to the Emergency Department by EMS for evaluation, COVID-19 testing, and possible admission depending on your condition and  test results.  What to do if you are LOW RISK for COVID-19?  Reduce your risk of any infection by using the same precautions used for avoiding the common cold or flu:  Marland Kitchen Wash your hands often with soap and warm water for at least 20 seconds.  If soap and water are not readily available, use an alcohol-based hand sanitizer with at least 60% alcohol.  . If coughing or sneezing, cover your mouth and nose by coughing or sneezing into the elbow areas of your shirt or coat, into a tissue or into your sleeve (not your hands). . Avoid shaking hands with others and consider head nods or verbal greetings only. . Avoid touching your eyes, nose, or mouth with unwashed hands.  . Avoid close contact with people who are sick. . Avoid places or events with large numbers of people in one location, like concerts or sporting events. . Carefully consider travel plans you have or are making. . If you are planning any travel outside or inside the [place], visit the CDC's Travelers' Health webpage for the latest health notices. . If you have some symptoms but not all symptoms, continue to monitor at home and seek medical attention if your symptoms worsen. . If you are having a medical emergency, call 911.   [place] / e-Visit: [email or web address]         [facility]: [facility]: [phone]                   [facility]: [phone]

## 2019-03-23 NOTE — Progress Notes (Signed)
[facility] CSW Progress Notes  Patient will be transported home via [facility] - they have pick up scheduled for 12:30 - 1 at [facility].  If this time needs to be adjusted, patient or staff can call reservation line at [phone].  Infusion room advised.   Edwyna Shell, LCSW Clinical Social Worker Phone:  [phone]

## 2019-03-25 ENCOUNTER — Inpatient Hospital Stay: Payer: HMO

## 2019-03-27 ENCOUNTER — Emergency Department (HOSPITAL_COMMUNITY): Payer: HMO

## 2019-03-27 ENCOUNTER — Encounter (HOSPITAL_COMMUNITY): Payer: Self-pay | Admitting: Radiology

## 2019-03-27 ENCOUNTER — Emergency Department (HOSPITAL_COMMUNITY)
Admission: EM | Admit: 2019-03-27 | Discharge: 2019-03-27 | Disposition: A | Discharge status: Home / Self Care | Payer: HMO | Attending: Emergency Medicine | Admitting: Emergency Medicine

## 2019-03-27 ENCOUNTER — Other Ambulatory Visit: Payer: Self-pay

## 2019-03-27 ENCOUNTER — Inpatient Hospital Stay: Payer: HMO

## 2019-03-27 DIAGNOSIS — I10 Essential (primary) hypertension: Secondary | ICD-10-CM | POA: Diagnosis not present

## 2019-03-27 DIAGNOSIS — Z7982 Long term (current) use of aspirin: Secondary | ICD-10-CM | POA: Insufficient documentation

## 2019-03-27 DIAGNOSIS — Z87891 Personal history of nicotine dependence: Secondary | ICD-10-CM | POA: Diagnosis not present

## 2019-03-27 DIAGNOSIS — Z794 Long term (current) use of insulin: Secondary | ICD-10-CM | POA: Insufficient documentation

## 2019-03-27 DIAGNOSIS — R1110 Vomiting, unspecified: Secondary | ICD-10-CM | POA: Diagnosis not present

## 2019-03-27 DIAGNOSIS — R1111 Vomiting without nausea: Secondary | ICD-10-CM | POA: Diagnosis not present

## 2019-03-27 DIAGNOSIS — R112 Nausea with vomiting, unspecified: Secondary | ICD-10-CM | POA: Insufficient documentation

## 2019-03-27 DIAGNOSIS — Z89431 Acquired absence of right foot: Secondary | ICD-10-CM | POA: Diagnosis not present

## 2019-03-27 DIAGNOSIS — Z79899 Other long term (current) drug therapy: Secondary | ICD-10-CM | POA: Insufficient documentation

## 2019-03-27 DIAGNOSIS — E1165 Type 2 diabetes mellitus with hyperglycemia: Secondary | ICD-10-CM | POA: Diagnosis not present

## 2019-03-27 DIAGNOSIS — R110 Nausea: Secondary | ICD-10-CM | POA: Diagnosis not present

## 2019-03-27 DIAGNOSIS — R197 Diarrhea, unspecified: Secondary | ICD-10-CM | POA: Insufficient documentation

## 2019-03-27 DIAGNOSIS — C3410 Malignant neoplasm of upper lobe, unspecified bronchus or lung: Secondary | ICD-10-CM | POA: Diagnosis not present

## 2019-03-27 DIAGNOSIS — Z85118 Personal history of other malignant neoplasm of bronchus and lung: Secondary | ICD-10-CM | POA: Insufficient documentation

## 2019-03-27 DIAGNOSIS — E119 Type 2 diabetes mellitus without complications: Secondary | ICD-10-CM | POA: Insufficient documentation

## 2019-03-27 LAB — COMPREHENSIVE METABOLIC PANEL
ALT: 16 U/L (ref 0–44)
AST: 14 U/L — ABNORMAL LOW (ref 15–41)
Albumin: 3.6 g/dL (ref 3.5–5.0)
Alkaline Phosphatase: 124 U/L (ref 38–126)
Anion gap: 8 (ref 5–15)
BUN: 23 mg/dL (ref 8–23)
CO2: 25 mmol/L (ref 22–32)
Calcium: 8.6 mg/dL — ABNORMAL LOW (ref 8.9–10.3)
Chloride: 102 mmol/L (ref 98–111)
Creatinine, Ser: 1.25 mg/dL — ABNORMAL HIGH (ref 0.44–1.00)
GFR calc Af Amer: 52 mL/min — ABNORMAL LOW (ref >60)
GFR calc non Af Amer: 45 mL/min — ABNORMAL LOW (ref >60)
Glucose, Bld: 255 mg/dL — ABNORMAL HIGH (ref 70–99)
Potassium: 4.2 mmol/L (ref 3.5–5.1)
Sodium: 135 mmol/L (ref 135–145)
Total Bilirubin: 0.6 mg/dL (ref 0.3–1.2)
Total Protein: 6.6 g/dL (ref 6.5–8.1)

## 2019-03-27 LAB — TROPONIN I: Troponin I: <0.03 ng/mL (ref <0.03)

## 2019-03-27 LAB — CBC WITH DIFFERENTIAL/PLATELET
Abs Immature Granulocytes: 0.3 10*3/uL — ABNORMAL HIGH (ref 0.00–0.07)
Basophils Absolute: 0 10*3/uL (ref 0.0–0.1)
Basophils Relative: 0 %
Eosinophils Absolute: 0.3 10*3/uL (ref 0.0–0.5)
Eosinophils Relative: 1 %
HCT: 27.3 % — ABNORMAL LOW (ref 36.0–46.0)
Hemoglobin: 8.4 g/dL — ABNORMAL LOW (ref 12.0–15.0)
Lymphocytes Relative: 6 %
Lymphs Abs: 1.6 10*3/uL (ref 0.7–4.0)
MCH: 28.3 pg (ref 26.0–34.0)
MCHC: 30.8 g/dL (ref 30.0–36.0)
MCV: 91.9 fL (ref 80.0–100.0)
Monocytes Absolute: 0.3 10*3/uL (ref 0.1–1.0)
Monocytes Relative: 1 %
Myelocytes: 1 %
Neutro Abs: 23.9 10*3/uL — ABNORMAL HIGH (ref 1.7–7.7)
Neutrophils Relative %: 91 %
Platelets: 269 10*3/uL (ref 150–400)
RBC: 2.97 MIL/uL — ABNORMAL LOW (ref 3.87–5.11)
RDW: 22.5 % — ABNORMAL HIGH (ref 11.5–15.5)
WBC: 26.3 10*3/uL — ABNORMAL HIGH (ref 4.0–10.5)
nRBC: 0 % (ref 0.0–0.2)

## 2019-03-27 LAB — URINALYSIS, ROUTINE W REFLEX MICROSCOPIC
Bacteria, UA: NONE SEEN
Bilirubin Urine: NEGATIVE
Glucose, UA: 50 mg/dL — AB
Hgb urine dipstick: NEGATIVE
Ketones, ur: NEGATIVE mg/dL
Leukocytes,Ua: NEGATIVE
Nitrite: NEGATIVE
Protein, ur: 100 mg/dL — AB
Specific Gravity, Urine: 1.021 (ref 1.005–1.030)
pH: 7 (ref 5.0–8.0)

## 2019-03-27 LAB — LIPASE, BLOOD: Lipase: 48 U/L (ref 11–51)

## 2019-03-27 MED ORDER — ONDANSETRON 4 MG PO TBDP: 4.0000 mg | ORAL_TABLET | Freq: Three times a day (TID) | ORAL | 0 refills | Status: DC | PRN

## 2019-03-27 MED ORDER — ONDANSETRON HCL 4 MG/2ML IJ SOLN: 4.0000 mg | INTRAMUSCULAR | Status: DC | PRN

## 2019-03-27 MED ORDER — IOHEXOL 300 MG/ML  SOLN
100.0000 mL | Freq: Once | INTRAMUSCULAR | Status: AC | PRN
  Administered 2019-03-27: 100 mL via INTRAVENOUS

## 2019-03-27 MED ORDER — SODIUM CHLORIDE 0.9 % IV BOLUS
1000.0000 mL | Freq: Once | INTRAVENOUS | Status: AC
  Administered 2019-03-27: 1000 mL via INTRAVENOUS

## 2019-03-27 MED ORDER — FAMOTIDINE IN NACL 20-0.9 MG/50ML-% IV SOLN
20.0000 mg | Freq: Once | INTRAVENOUS | Status: AC
  Administered 2019-03-27: 20 mg via INTRAVENOUS
  Filled 2019-03-27: qty 50

## 2019-03-27 MED ORDER — SODIUM CHLORIDE (PF) 0.9 % IJ SOLN
INTRAMUSCULAR | Status: AC
  Filled 2019-03-27: qty 50

## 2019-03-27 NOTE — Discharge Instructions (Signed)
Take the prescription as directed.  Increase your fluid intake (ie:  Gatoraide) for the next few days.  Eat a bland diet and advance to your regular diet slowly as you can tolerate it.   Avoid full strength juices, as well as milk and milk products until your diarrhea has resolved.   Call your regular medical doctor today to schedule a follow up appointment in the next 2 days.  Return to the Emergency Department immediately sooner if worsening.

## 2019-03-27 NOTE — ED Triage Notes (Signed)
Transported by [facility] from home--history of lung cancer, n/v/d since Friday. Denies abdominal pain. Had Chemo therapy on Thursday.

## 2019-03-27 NOTE — ED Notes (Signed)
Bed: BH41 Expected date:  Expected time:  Means of arrival:  Comments: EMS n/v/d

## 2019-03-27 NOTE — ED Provider Notes (Signed)
[facility] DEPT Provider Note   CSN: [id number] Arrival date & time: 03/27/19  0945    History   Chief Complaint Chief Complaint  Patient presents with  . Emesis    HPI Tara Hopkins is a 68 y.o. female.     HPI  Pt was seen at 0955.  Per pt, c/o gradual onset and persistence of multiple intermittent episodes of N/V/D that began 3 days ago.   Describes the stools as "watery."  Pt states her LD chemo (+decadron+Neulasta, was the day before her symptoms began. Pt states she has been taking her home antiemetic without improvement (cannot recall the name). Has been unable to tol PO due to N/V. Denies fevers, cough, known COVID+ exposures. Denies abd pain, no CP/SOB, no back pain, no rash, no black or blood in stools or emesis.      Past Medical History:  Diagnosis Date  . Anemia   . Anxiety   . Bipolar 1 disorder ([place])   . Cataracts, bilateral   . Depression   . Diabetes mellitus without complication ([place])   . Glaucoma   . History of blood transfusion   . History of hyperbaric oxygen therapy    pt reports 80 treatments.  . Hypercholesteremia   . Hypertension   . Mediastinal adenopathy   . Peripheral neuropathy   . Pneumonia   . SCL ca Dx'd 12/2018  . Stroke ([place]) 02/2015  . Wears glasses     Patient Active Problem List   Diagnosis Date Noted  . Encounter for antineoplastic chemotherapy 01/06/2019  . Goals of care, counseling/discussion 01/06/2019  . 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 Mediastinal adenopathy 12/22/2018  . Mass of right lung 11/10/2018  . Right hand pain 01/19/2018  . History of stroke 10/05/2017  . Former smoker 10/05/2017  . Substance abuse ([place]) 10/05/2017  . Cerebrovascular accident (CVA) due to thrombosis of right middle cerebral artery ([place]) 07/23/2017  . Subcortical infarction ([place])   . Hyperlipidemia 07/22/2017  . Mild tetrahydrocannabinol (THC) abuse   . Smoker   . CVA  (cerebral vascular accident) ([place]) 07/21/2017  . Ischemic stroke ([place]) 07/21/2017  . Diabetes mellitus with complication ([place]) 02/40/9735  . Slurred speech 07/21/2017  . Bipolar disorder ([place]) 09/09/2015  . Left hemiparesis ([place]) 02/20/2015  . Cocaine abuse ([place])   . Stroke ([place]) 02/15/2015  . Osteomyelitis of foot, right, acute ([place]) 02/23/2014  . 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 Essential hypertension 05/21/2013  . Depression 05/21/2013  . Anemia 05/21/2013  . Renal insufficiency 05/21/2013    Past Surgical History:  Procedure Laterality Date  . AMPUTATION Right 12/15/2013   Procedure: Right Transmetatarsal Amputation;  Surgeon: Newt Minion, MD;  Location: [facility];  Service: Orthopedics;  Laterality: Right;  . AMPUTATION Right 02/23/2014   Procedure: AMPUTATION FOOT;  Surgeon: Newt Minion, MD;  Location: [facility];  Service: Orthopedics;  Laterality: Right;  Right Foot Revision Transmetatarsal Amputation  . CATARACT EXTRACTION W/ INTRAOCULAR LENS  IMPLANT, BILATERAL    . COLONOSCOPY W/ BIOPSIES AND POLYPECTOMY    . I&D EXTREMITY Right 05/23/2013   Procedure: IRRIGATION AND DEBRIDEMENT RIGHT GREAT TOE;  Surgeon: Mauri Pole, MD;  Location: [facility];  Service: Orthopedics;  Laterality: Right;  . TUBAL LIGATION    . VIDEO BRONCHOSCOPY WITH ENDOBRONCHIAL ULTRASOUND N/A 12/22/2018   Procedure:  VIDEO BRONCHOSCOPY WITH ENDOBRONCHIAL ULTRASOUND;  Surgeon: [name], DO;  Location: [facility];  Service: Thoracic;  Laterality: N/A;     OB History   No obstetric history on file.      Home Medications    Prior to Admission medications   Medication Sig Start Date End Date Taking? Authorizing Provider  amLODipine (NORVASC) 5 MG tablet TAKE ONE TABLET BY MOUTH DAILY 02/28/19   Nafziger, Tommi Rumps, NP  aspirin 325 MG EC tablet Take 1 tablet (325 mg total) by mouth daily. 10/14/18   Nafziger,  Tommi Rumps, NP  aspirin EC 81 MG tablet Take 81 mg by mouth daily.    [provider]  atorvastatin (LIPITOR) 40 MG tablet TAKE ONE TABLET BY MOUTH DAILY AT 6 PM 01/18/19   Nafziger, Tommi Rumps, NP  citalopram (CELEXA) 10 MG tablet Take 1 tablet (10 mg total) by mouth daily. 10/14/18 01/12/19  Nafziger, Tommi Rumps, NP  clonazePAM (KLONOPIN) 0.5 MG tablet TAKE 1 TABLET BY MOUTH THREE TIMES A DAY 03/03/19   Nafziger, Tommi Rumps, NP  gabapentin (NEURONTIN) 400 MG capsule TAKE 3 CAPSULES BY MOUTH THREE TIMES A DAY Patient taking differently: Take 1,200 mg by mouth 3 (three) times daily. TAKE 3 CAPSULES BY MOUTH THREE TIMES A DAY 10/14/18   Nafziger, Tommi Rumps, NP  glimepiride (AMARYL) 4 MG tablet Take 1 tablet (4 mg total) by mouth daily with breakfast. 01/18/19   Nafziger, Tommi Rumps, NP  glucose blood (ONETOUCH VERIO) test strip USE TO TEST BLOOD GLUCOSE 3 TIMES DAILY 10/14/18   Nafziger, Tommi Rumps, NP  Insulin Glargine (LANTUS SOLOSTAR) 100 UNIT/ML Solostar Pen Inject 25 Units into the skin at bedtime. Patient taking differently: Inject 40 Units into the skin at bedtime.  10/14/18   Nafziger, Tommi Rumps, NP  Insulin Pen Needle (B-D UF III MINI PEN NEEDLES) 31G X 5 MM MISC Use as directed. 10/14/18   Nafziger, Tommi Rumps, NP  latanoprost (XALATAN) 0.005 % ophthalmic solution INSTILL 1 DROP INTO BOTH EYES EVERY EVENING Patient taking differently: Place 1 drop into both eyes at bedtime.  10/14/18   Nafziger, Tommi Rumps, NP  lisinopril (PRINIVIL,ZESTRIL) 40 MG tablet Take 1 tablet (40 mg total) by mouth daily. 10/14/18   Nafziger, Tommi Rumps, NP  ONE TOUCH LANCETS MISC USE TO CHECK BLOOD SUGAR 3 TIMES DAILY 09/23/17   Nafziger, Tommi Rumps, NP  prochlorperazine (COMPAZINE) 10 MG tablet TAKE ONE TABLET BY MOUTH EVERY 6 HOURS AS NEEDED FOR NAUSEA OR VOMITING 03/03/19   Curt Bears, MD    Family History Family History  Problem Relation Age of Onset  . Liver cancer Mother   . Lung cancer Mother        smoked  . Depression Mother   . Hypertension Father   . Depression  Father   . Colon cancer Neg Hx     Social History Social History   Tobacco Use  . Smoking status: Former Smoker    Packs/day: 0.50    Years: 40.00    Pack years: 20.00    Types: Cigarettes    Last attempt to quit: 10/17/2018    Years since quitting: 0.4  . Smokeless tobacco: Never Used  Substance Use Topics  . Alcohol use: Not Currently    Alcohol/week: 0.0 standard drinks  . Drug use: Yes    Types: Cocaine, Marijuana    Comment: last use of cocaine last use 12/09/2018     Allergies   Patient has no known allergies.   Review of Systems Review of Systems ROS: Statement: All  systems negative except as marked or noted in the HPI; Constitutional: Negative for fever and chills. ; ; Eyes: Negative for eye pain, redness and discharge. ; ; ENMT: Negative for ear pain, hoarseness, nasal congestion, sinus pressure and sore throat. ; ; Cardiovascular: Negative for chest pain, palpitations, diaphoresis, dyspnea and peripheral edema. ; ; Respiratory: Negative for cough, wheezing and stridor. ; ; Gastrointestinal: +N/V/D. Negative for abdominal pain, blood in stool, hematemesis, jaundice and rectal bleeding. . ; ; Genitourinary: Negative for dysuria, flank pain and hematuria. ; ; Musculoskeletal: Negative for back pain and neck pain. Negative for swelling and trauma.; ; Skin: Negative for pruritus, rash, abrasions, blisters, bruising and skin lesion.; ; Neuro: Negative for headache, lightheadedness and neck stiffness. Negative for weakness, altered level of consciousness, altered mental status, extremity weakness, paresthesias, involuntary movement, seizure and syncope.        Physical Exam Updated Vital Signs BP (!) 155/69 (BP Location: Left Arm)   Pulse 76   Temp 98.7 F (37.1 C) (Oral)   Resp 18   SpO2 95%    14:18 Orthostatic Vital Signs JL  Orthostatic Lying   BP- Lying: 188/91Abnormal   Pulse- Lying: 88      Orthostatic Sitting  BP- Sitting: 184/101Abnormal   Pulse-  Sitting: 97      Orthostatic Standing at 0 minutes  BP- Standing at 0 minutes: 178/85  Pulse- Standing at 0 minutes: 115      Physical Exam 1000: Physical examination:  Nursing notes reviewed; Vital signs and O2 SAT reviewed;  Constitutional: Well developed, Well nourished, In no acute distress; Head:  Normocephalic, atraumatic; Eyes: EOMI, PERRL, No scleral icterus; ENMT: Mouth and pharynx normal, Mucous membranes dry; Neck: Supple, Full range of motion, No lymphadenopathy; Cardiovascular: Regular rate and rhythm, No gallop; Respiratory: Breath sounds clear & equal bilaterally, No wheezes.  Speaking full sentences with ease, Normal respiratory effort/excursion; Chest: Nontender, Movement normal; Abdomen: Soft, Nontender, Nondistended, Normal bowel sounds; Genitourinary: No CVA tenderness; Extremities: Peripheral pulses normal, No tenderness, No edema, No calf edema or asymmetry.; Neuro: AA&Ox3, Major CN grossly intact.  Speech clear. No gross focal motor or sensory deficits in extremities.; Skin: Color normal, Warm, Dry.    ED Treatments / Results  Labs (all labs ordered are listed, but only abnormal results are displayed)   EKG EKG Interpretation  Date/Time:  Monday Mar 27 2019 10:12:33 EDT Ventricular Rate:  80 PR Interval:    QRS Duration: 90 QT Interval:  351 QTC Calculation: 405 R Axis:   60 Text Interpretation:  Sinus rhythm LVH with secondary repolarization abnormality Baseline wander When compared with ECG of 07/20/2017 No significant change was found Confirmed by Francine Graven [phone]) on 03/27/2019 10:27:31 AM   Radiology   Procedures Procedures (including critical care time)  Medications Ordered in ED Medications  ondansetron (ZOFRAN) injection 4 mg (has no administration in time range)  famotidine (PEPCID) IVPB 20 mg premix (has no administration in time range)  sodium chloride 0.9 % bolus 1,000 mL (1,000 mLs Intravenous New Bag/Given 03/27/19 1007)      Initial Impression / Assessment and Plan / ED Course  I have reviewed the triage vital signs and the nursing notes.  Pertinent labs & imaging results that were available during my care of the patient were reviewed by me and considered in my medical decision making (see chart for details).    MDM Reviewed: previous chart, nursing note and vitals Reviewed previous: labs and ECG Interpretation: labs, ECG, x-ray  and CT scan   Results for orders placed or performed during the hospital encounter of 03/27/19  Urinalysis, Routine w reflex microscopic  Result Value Ref Range   Color, Urine COLORLESS (A) YELLOW   APPearance CLEAR CLEAR   Specific Gravity, Urine 1.021 1.005 - 1.030   pH 7.0 5.0 - 8.0   Glucose, UA 50 (A) NEGATIVE mg/dL   Hgb urine dipstick NEGATIVE NEGATIVE   Bilirubin Urine NEGATIVE NEGATIVE   Ketones, ur NEGATIVE NEGATIVE mg/dL   Protein, ur 100 (A) NEGATIVE mg/dL   Nitrite NEGATIVE NEGATIVE   Leukocytes,Ua NEGATIVE NEGATIVE   RBC / HPF 0-5 0 - 5 RBC/hpf   WBC, UA 0-5 0 - 5 WBC/hpf   Bacteria, UA NONE SEEN NONE SEEN   Squamous Epithelial / LPF 0-5 0 - 5   Mucus PRESENT   Comprehensive metabolic panel  Result Value Ref Range   Sodium 135 135 - 145 mmol/L   Potassium 4.2 3.5 - 5.1 mmol/L   Chloride 102 98 - 111 mmol/L   CO2 25 22 - 32 mmol/L   Glucose, Bld 255 (H) 70 - 99 mg/dL   BUN 23 8 - 23 mg/dL   Creatinine, Ser 1.25 (H) 0.44 - 1.00 mg/dL   Calcium 8.6 (L) 8.9 - 10.3 mg/dL   Total Protein 6.6 6.5 - 8.1 g/dL   Albumin 3.6 3.5 - 5.0 g/dL   AST 14 (L) 15 - 41 U/L   ALT 16 0 - 44 U/L   Alkaline Phosphatase 124 38 - 126 U/L   Total Bilirubin 0.6 0.3 - 1.2 mg/dL   GFR calc non Af Amer 45 (L) >60 mL/min   GFR calc Af Amer 52 (L) >60 mL/min   Anion gap 8 5 - 15  Lipase, blood  Result Value Ref Range   Lipase 48 11 - 51 U/L  Troponin I - Once  Result Value Ref Range   Troponin I <0.03 <0.03 ng/mL  CBC with Differential  Result Value Ref Range   WBC 26.3  (H) 4.0 - 10.5 K/uL   RBC 2.97 (L) 3.87 - 5.11 MIL/uL   Hemoglobin 8.4 (L) 12.0 - 15.0 g/dL   HCT 27.3 (L) 36.0 - 46.0 %   MCV 91.9 80.0 - 100.0 fL   MCH 28.3 26.0 - 34.0 pg   MCHC 30.8 30.0 - 36.0 g/dL   RDW 22.5 (H) 11.5 - 15.5 %   Platelets 269 150 - 400 K/uL   nRBC 0.0 0.0 - 0.2 %   Neutrophils Relative % 91 %   Neutro Abs 23.9 (H) 1.7 - 7.7 K/uL   Lymphocytes Relative 6 %   Lymphs Abs 1.6 0.7 - 4.0 K/uL   Monocytes Relative 1 %   Monocytes Absolute 0.3 0.1 - 1.0 K/uL   Eosinophils Relative 1 %   Eosinophils Absolute 0.3 0.0 - 0.5 K/uL   Basophils Relative 0 %   Basophils Absolute 0.0 0.0 - 0.1 K/uL   WBC Morphology DOHLE BODIES    RBC Morphology POLYCHROMASIA PRESENT    Myelocytes 1 %   Abs Immature Granulocytes 0.30 (H) 0.00 - 0.07 K/uL   Hypersegmented Neutrophils PRESENT    Dg Chest 2 View Result Date: 03/27/2019 CLINICAL DATA:  Lung carcinoma.  Nausea and vomiting EXAM: CHEST - 2 VIEW COMPARISON:  Chest CT Mar 14, 2019; chest radiographic examination October 16, 2018 FINDINGS: There is no evident edema or consolidation. Previously noted right pulmonary nodular lesion not evident by radiography. The heart  is slightly increased in size with pulmonary vascularity normal. Adenopathy appreciated on recent CT is not well delineated by radiography. There is degenerative change in the thoracic spine. No blastic or lytic bone lesions evident. IMPRESSION: No edema or consolidation. Known adenopathy seen on recent CT is not well delineated by chest radiography. Heart is slightly increased in size. Electronically Signed   By: Lowella Grip III M.D.   On: 03/27/2019 10:45    Ct Abdomen Pelvis W Contrast Result Date: 03/27/2019 CLINICAL DATA:  Diarrhea, nausea, and vomiting. EXAM: CT ABDOMEN AND PELVIS WITH CONTRAST TECHNIQUE: Multidetector CT imaging of the abdomen and pelvis was performed using the standard protocol following bolus administration of intravenous contrast. CONTRAST:   175mL OMNIPAQUE IOHEXOL 300 MG/ML  SOLN COMPARISON:  PET-CT 12/01/2018 FINDINGS: Lower chest: Coronary calcifications seen along the lateral left ventricle. Hepatobiliary: No focal liver abnormality.No evidence of biliary obstruction or stone. Pancreas: Unremarkable. Spleen: Unremarkable. Adrenals/Urinary Tract: Negative adrenals. No hydronephrosis or stone. Unremarkable bladder. Stomach/Bowel: No obstruction. No appendicitis. Formed stool throughout much of the colon. Vascular/Lymphatic: No acute vascular abnormality. Moderate atherosclerotic calcification. No mass or adenopathy. Reproductive:Negative. Other: No ascites or pneumoperitoneum. Musculoskeletal: No acute abnormalities. Advanced disc degeneration at L2-3 and L3-4. IMPRESSION: No acute finding or explanation for symptoms. Electronically Signed   By: Monte Fantasia M.D.   On: 03/27/2019 11:39    1530:  Pt has tol PO well while in the ED without N/V.  No stooling while in the ED.  Abd remains benign, resps easy, VSS. Feels better and wants to go home now. Pt not orthostatic on VS. WBC count elevated: doubt sepsis as pt has just received IV steroids as well as Neulasta several days ago. No clear indication for admission at this time and pt wants to go home. Tx symptomatically, f/u PMD or Onc MD. Dx and testing, as well as incidental finding(s), d/w pt.  Questions answered.  Verb understanding, agreeable to d/c home with outpt f/u.       Final Clinical Impressions(s) / ED Diagnoses   Final diagnoses:  None    ED Discharge Orders    None       Francine Graven, DO 04/01/19 1618

## 2019-03-27 NOTE — ED Notes (Signed)
Periwick applied to patient, patient incontinent of urine; bed lines changed and new brief applied.

## 2019-03-27 NOTE — ED Notes (Signed)
Patient transported to X-ray 

## 2019-03-27 NOTE — ED Notes (Signed)
ED Provider at bedside. 

## 2019-03-27 NOTE — ED Notes (Signed)
Patient transported to CT 

## 2019-03-28 LAB — URINE CULTURE

## 2019-03-29 DIAGNOSIS — E1142 Type 2 diabetes mellitus with diabetic polyneuropathy: Secondary | ICD-10-CM | POA: Diagnosis not present

## 2019-03-29 DIAGNOSIS — M869 Osteomyelitis, unspecified: Secondary | ICD-10-CM | POA: Diagnosis not present

## 2019-03-29 DIAGNOSIS — G8194 Hemiplegia, unspecified affecting left nondominant side: Secondary | ICD-10-CM | POA: Diagnosis not present

## 2019-03-29 DIAGNOSIS — I639 Cerebral infarction, unspecified: Secondary | ICD-10-CM | POA: Diagnosis not present

## 2019-03-31 ENCOUNTER — Telehealth: Payer: Self-pay | Admitting: *Deleted

## 2019-03-31 ENCOUNTER — Other Ambulatory Visit: Payer: Self-pay | Admitting: Adult Health

## 2019-03-31 DIAGNOSIS — M792 Neuralgia and neuritis, unspecified: Secondary | ICD-10-CM

## 2019-03-31 NOTE — Telephone Encounter (Signed)
"  [name] [phone]).  I do not know who but someone there called me.  Maybe it was because I missed my appointment Monday.  Did not feel well all weekend so I went to the [place] much better today.  No traveling in past 14 days.  No flu-like symptoms.  Have not been around anyone sick or been tested for [facility]  [facility]."  Provided 04-04-2019 appointment information.

## 2019-04-04 ENCOUNTER — Telehealth: Payer: Self-pay | Admitting: Internal Medicine

## 2019-04-04 ENCOUNTER — Inpatient Hospital Stay: Payer: HMO

## 2019-04-04 NOTE — Telephone Encounter (Signed)
Called pt per 5/26 sch message - number disconnected

## 2019-04-05 ENCOUNTER — Telehealth: Payer: Self-pay | Admitting: *Deleted

## 2019-04-05 NOTE — Telephone Encounter (Signed)
VM message left for patient to remind her of her appt next week 04/10/19 and time 8am. Informed her that she can get a schedule for the rest of the week's appts on Monday

## 2019-04-06 ENCOUNTER — Other Ambulatory Visit: Payer: Self-pay

## 2019-04-06 ENCOUNTER — Ambulatory Visit (INDEPENDENT_AMBULATORY_CARE_PROVIDER_SITE_OTHER): Payer: HMO | Admitting: Adult Health

## 2019-04-06 ENCOUNTER — Encounter: Payer: Self-pay | Admitting: Adult Health

## 2019-04-06 VITALS — BP 170/90 | HR 72 | Temp 98.2°F

## 2019-04-06 DIAGNOSIS — Z742 Need for assistance at home and no other household member able to render care: Secondary | ICD-10-CM | POA: Diagnosis not present

## 2019-04-06 DIAGNOSIS — I10 Essential (primary) hypertension: Secondary | ICD-10-CM | POA: Diagnosis not present

## 2019-04-06 MED ORDER — AMLODIPINE BESYLATE 10 MG PO TABS: 10.0000 mg | ORAL_TABLET | Freq: Every day | ORAL | 0 refills | Status: DC

## 2019-04-06 NOTE — Patient Instructions (Signed)
It was great seeing you today   I have increased your blood pressure medication ( Norvasc) from 5 mg to 10mg . You can take two pills of what you have at home until you pick up your new prescription  Please stop drinking juice, this is causing your blood sugars to be too high.

## 2019-04-06 NOTE — Progress Notes (Addendum)
Subjective:    Patient ID: Tara Hopkins, female    DOB: 1951/01/21, 68 y.o.   MRN: [id number]  HPI 68 year old female who  has a past medical history of Anemia, Anxiety, Bipolar 1 disorder ([place]), Cataracts, bilateral, Depression, Diabetes mellitus without complication ([place]), Glaucoma, History of blood transfusion, History of hyperbaric oxygen therapy, Hypercholesteremia, Hypertension, Mediastinal adenopathy, Peripheral neuropathy, Pneumonia, SCL ca (Dx'd 12/2018), Stroke ([place]) (02/2015), and Wears glasses.    The 68 year old female who is being evaluated today for home health care needs.  She is currently going through treatment for lung cancer and finds it difficult to bathe, dress, cook, and clean.  She is pretty much wheelchair-bound at this point.   Reports that she is tolerating treatment well and voices no complaints.  Additionally it is noticed that her blood pressure continues to be elevated by taking Norvasc 5 mg lisinopril 40 mg.  She does report taking her medications on a daily basis.   BP Readings from Last 3 Encounters:  04/06/19 (!) 170/90  03/27/19 (!) 178/85  03/23/19 (!) 153/74      Review of Systems See HPI   Past Medical History:  Diagnosis Date  . Anemia   . Anxiety   . Bipolar 1 disorder ([place])   . Cataracts, bilateral   . Depression   . Diabetes mellitus without complication ([place])   . Glaucoma   . History of blood transfusion   . History of hyperbaric oxygen therapy    pt reports 80 treatments.  . Hypercholesteremia   . Hypertension   . Mediastinal adenopathy   . Peripheral neuropathy   . Pneumonia   . SCL ca Dx'd 12/2018  . Stroke ([place]) 02/2015  . Wears glasses     Social History   Socioeconomic History  . Marital status: Married    Spouse name: Not on file  . Number of children: 3  . Years of education: 56  . Highest education level: Not on file  Occupational History  . Occupation: Disbaled  Social Needs  . Financial resource  strain: Not on file  . Food insecurity:    Worry: Not on file    Inability: Not on file  . Transportation needs:    Medical: Not on file    Non-medical: Not on file  Tobacco Use  . Smoking status: Former Smoker    Packs/day: 0.50    Years: 40.00    Pack years: 20.00    Types: Cigarettes    Last attempt to quit: 10/17/2018    Years since quitting: 0.4  . Smokeless tobacco: Never Used  Substance and Sexual Activity  . Alcohol use: Not Currently    Alcohol/week: 0.0 standard drinks  . Drug use: Yes    Types: Cocaine, Marijuana    Comment: last use of cocaine last use 12/09/2018  . Sexual activity: Not on file  Lifestyle  . Physical activity:    Days per week: Not on file    Minutes per session: Not on file  . Stress: Not on file  Relationships  . Social connections:    Talks on phone: Not on file    Gets together: Not on file    Attends religious service: Not on file    Active member of club or organization: Not on file    Attends meetings of clubs or organizations: Not on file    Relationship status: Not on file  . Intimate partner violence:    Fear of current or  ex partner: Not on file    Emotionally abused: Not on file    Physically abused: Not on file    Forced sexual activity: Not on file  Other Topics Concern  . Not on file  Social History Narrative   Lives at home alone.   Right-handed.   No caffeine use.   Oldest of 12 children     Past Surgical History:  Procedure Laterality Date  . AMPUTATION Right 12/15/2013   Procedure: Right Transmetatarsal Amputation;  Surgeon: Newt Minion, MD;  Location: [facility];  Service: Orthopedics;  Laterality: Right;  . AMPUTATION Right 02/23/2014   Procedure: AMPUTATION FOOT;  Surgeon: Newt Minion, MD;  Location: [facility];  Service: Orthopedics;  Laterality: Right;  Right Foot Revision Transmetatarsal Amputation  . CATARACT EXTRACTION W/ INTRAOCULAR LENS  IMPLANT, BILATERAL    . COLONOSCOPY W/ BIOPSIES AND POLYPECTOMY    . I&D  EXTREMITY Right 05/23/2013   Procedure: IRRIGATION AND DEBRIDEMENT RIGHT GREAT TOE;  Surgeon: Mauri Pole, MD;  Location: [facility];  Service: Orthopedics;  Laterality: Right;  . TUBAL LIGATION    . VIDEO BRONCHOSCOPY WITH ENDOBRONCHIAL ULTRASOUND N/A 12/22/2018   Procedure: VIDEO BRONCHOSCOPY WITH ENDOBRONCHIAL ULTRASOUND;  Surgeon: [name], DO;  Location: [facility];  Service: Thoracic;  Laterality: N/A;    Family History  Problem Relation Age of Onset  . Liver cancer Mother   . Lung cancer Mother        smoked  . Depression Mother   . Hypertension Father   . Depression Father   . Colon cancer Neg Hx     No Known Allergies  Current Outpatient Medications on File Prior to Visit  Medication Sig Dispense Refill  . amLODipine (NORVASC) 5 MG tablet TAKE ONE TABLET BY MOUTH DAILY 90 tablet 0  . aspirin 325 MG EC tablet Take 1 tablet (325 mg total) by mouth daily. 30 tablet 0  . aspirin EC 81 MG tablet Take 81 mg by mouth daily.    Marland Kitchen atorvastatin (LIPITOR) 40 MG tablet TAKE ONE TABLET BY MOUTH DAILY AT 6 PM (Patient taking differently: Take 40 mg by mouth daily at 6 PM. ) 90 tablet 0  . clonazePAM (KLONOPIN) 0.5 MG tablet TAKE 1 TABLET BY MOUTH THREE TIMES A DAY 90 tablet 0  . gabapentin (NEURONTIN) 400 MG capsule Take 3 capsules (1,200 mg total) by mouth 3 (three) times daily for 30 days. TAKE 3 CAPSULES BY MOUTH THREE TIMES A DAY 270 capsule 3  . glimepiride (AMARYL) 4 MG tablet Take 1 tablet (4 mg total) by mouth daily with breakfast. 90 tablet 0  . glucose blood (ONETOUCH VERIO) test strip USE TO TEST BLOOD GLUCOSE 3 TIMES DAILY 100 each 12  . Insulin Glargine (LANTUS SOLOSTAR) 100 UNIT/ML Solostar Pen Inject 25 Units into the skin at bedtime. (Patient taking differently: Inject 40 Units into the skin at bedtime. ) 15 mL 6  . Insulin Pen Needle (B-D UF III MINI PEN NEEDLES) 31G X 5 MM MISC Use as directed. 100 each 3  . latanoprost (XALATAN) 0.005 % ophthalmic solution INSTILL 1 DROP  INTO BOTH EYES EVERY EVENING (Patient taking differently: Place 1 drop into both eyes at bedtime. ) 7.5 mL 0  . lisinopril (PRINIVIL,ZESTRIL) 40 MG tablet Take 1 tablet (40 mg total) by mouth daily. 90 tablet 1  . ondansetron (ZOFRAN ODT) 4 MG disintegrating tablet Take 1 tablet (4 mg total) by mouth every 8 (eight) hours as  needed for nausea or vomiting. 6 tablet 0  . ONE TOUCH LANCETS MISC USE TO CHECK BLOOD SUGAR 3 TIMES DAILY 200 each 3  . prochlorperazine (COMPAZINE) 10 MG tablet TAKE ONE TABLET BY MOUTH EVERY 6 HOURS AS NEEDED FOR NAUSEA OR VOMITING (Patient taking differently: Take 10 mg by mouth every 6 (six) hours as needed for nausea or vomiting. ) 30 tablet 0  . traMADol (ULTRAM) 50 MG tablet TAKE ONE TABLET BY MOUTH EVERY 12 HOURS AS NEEDED 60 tablet 0  . citalopram (CELEXA) 10 MG tablet Take 1 tablet (10 mg total) by mouth daily. 90 tablet 1   No current facility-administered medications on file prior to visit.     BP (!) 170/90   Pulse 72   Temp 98.2 F (36.8 C)   SpO2 94%       Objective:   Physical Exam Vitals signs and nursing note reviewed.  Constitutional:      Appearance: Normal appearance.  HENT:     Mouth/Throat:     Mouth: Mucous membranes are moist.     Pharynx: Oropharynx is clear.  Cardiovascular:     Rate and Rhythm: Normal rate and regular rhythm.     Pulses: Normal pulses.     Heart sounds: Normal heart sounds.  Pulmonary:     Effort: Pulmonary effort is normal.     Breath sounds: Normal breath sounds.  Musculoskeletal:     Comments: In wheelchair    Skin:    General: Skin is warm and dry.  Neurological:     Mental Status: She is alert.  Psychiatric:        Mood and Affect: Mood normal.        Behavior: Behavior normal.        Thought Content: Thought content normal.        Judgment: Judgment normal.       Assessment & Plan:  1. Need for home health care -Do believe she could benefit greatly from home health aide.  Will place order but  this may be something that she will have to pay out of pocket for.   2. Essential hypertension - Will increase Norvasc to 10 mg. Likely need to add another agent  - amLODipine (NORVASC) 10 MG tablet; Take 1 tablet (10 mg total) by mouth daily.  Dispense: 90 tablet; Refill: 0  [name], NP

## 2019-04-10 ENCOUNTER — Inpatient Hospital Stay: Payer: HMO | Attending: Internal Medicine

## 2019-04-10 ENCOUNTER — Other Ambulatory Visit: Payer: Self-pay | Admitting: Physician Assistant

## 2019-04-10 ENCOUNTER — Encounter: Payer: Self-pay | Admitting: Physician Assistant

## 2019-04-10 ENCOUNTER — Inpatient Hospital Stay (HOSPITAL_BASED_OUTPATIENT_CLINIC_OR_DEPARTMENT_OTHER): Payer: HMO | Admitting: Physician Assistant

## 2019-04-10 ENCOUNTER — Other Ambulatory Visit: Payer: Self-pay

## 2019-04-10 ENCOUNTER — Inpatient Hospital Stay: Payer: HMO

## 2019-04-10 VITALS — BP 163/76 | HR 65 | Temp 98.9°F | Resp 20 | Ht 65.0 in | Wt 163.8 lb

## 2019-04-10 VITALS — BP 148/67 | HR 61 | Temp 98.0°F | Resp 20

## 2019-04-10 DIAGNOSIS — Z5111 Encounter for antineoplastic chemotherapy: Secondary | ICD-10-CM | POA: Diagnosis not present

## 2019-04-10 DIAGNOSIS — R112 Nausea with vomiting, unspecified: Secondary | ICD-10-CM | POA: Diagnosis not present

## 2019-04-10 DIAGNOSIS — C3411 Malignant neoplasm of upper lobe, right bronchus or lung: Secondary | ICD-10-CM

## 2019-04-10 DIAGNOSIS — Z5189 Encounter for other specified aftercare: Secondary | ICD-10-CM

## 2019-04-10 DIAGNOSIS — C3412 Malignant neoplasm of upper lobe, left bronchus or lung: Secondary | ICD-10-CM | POA: Diagnosis not present

## 2019-04-10 DIAGNOSIS — I10 Essential (primary) hypertension: Secondary | ICD-10-CM | POA: Insufficient documentation

## 2019-04-10 DIAGNOSIS — K521 Toxic gastroenteritis and colitis: Secondary | ICD-10-CM | POA: Insufficient documentation

## 2019-04-10 LAB — CMP (CANCER CENTER ONLY)
ALT: 19 U/L (ref 0–44)
AST: 16 U/L (ref 15–41)
Albumin: 3.5 g/dL (ref 3.5–5.0)
Alkaline Phosphatase: 134 U/L — ABNORMAL HIGH (ref 38–126)
Anion gap: 8 (ref 5–15)
BUN: 15 mg/dL (ref 8–23)
CO2: 25 mmol/L (ref 22–32)
Calcium: 9.1 mg/dL (ref 8.9–10.3)
Chloride: 107 mmol/L (ref 98–111)
Creatinine: 1.17 mg/dL — ABNORMAL HIGH (ref 0.44–1.00)
GFR, Est AFR Am: 56 mL/min — ABNORMAL LOW (ref >60)
GFR, Estimated: 49 mL/min — ABNORMAL LOW (ref >60)
Glucose, Bld: 197 mg/dL — ABNORMAL HIGH (ref 70–99)
Potassium: 4.3 mmol/L (ref 3.5–5.1)
Sodium: 140 mmol/L (ref 135–145)
Total Bilirubin: <0.2 mg/dL — ABNORMAL LOW (ref 0.3–1.2)
Total Protein: 6.9 g/dL (ref 6.5–8.1)

## 2019-04-10 LAB — CBC WITH DIFFERENTIAL (CANCER CENTER ONLY)
Abs Immature Granulocytes: 0.09 10*3/uL — ABNORMAL HIGH (ref 0.00–0.07)
Basophils Absolute: 0 10*3/uL (ref 0.0–0.1)
Basophils Relative: 0 %
Eosinophils Absolute: 0.1 10*3/uL (ref 0.0–0.5)
Eosinophils Relative: 1 %
HCT: 28.5 % — ABNORMAL LOW (ref 36.0–46.0)
Hemoglobin: 8.7 g/dL — ABNORMAL LOW (ref 12.0–15.0)
Immature Granulocytes: 1 %
Lymphocytes Relative: 13 %
Lymphs Abs: 1.7 10*3/uL (ref 0.7–4.0)
MCH: 29.4 pg (ref 26.0–34.0)
MCHC: 30.5 g/dL (ref 30.0–36.0)
MCV: 96.3 fL (ref 80.0–100.0)
Monocytes Absolute: 0.4 10*3/uL (ref 0.1–1.0)
Monocytes Relative: 3 %
Neutro Abs: 10.7 10*3/uL — ABNORMAL HIGH (ref 1.7–7.7)
Neutrophils Relative %: 82 %
Platelet Count: 303 10*3/uL (ref 150–400)
RBC: 2.96 MIL/uL — ABNORMAL LOW (ref 3.87–5.11)
RDW: 24.1 % — ABNORMAL HIGH (ref 11.5–15.5)
WBC Count: 13 10*3/uL — ABNORMAL HIGH (ref 4.0–10.5)
nRBC: 0 % (ref 0.0–0.2)

## 2019-04-10 MED ORDER — SODIUM CHLORIDE 0.9 % IV SOLN
100.0000 mg/m2 | Freq: Once | INTRAVENOUS | Status: AC
  Administered 2019-04-10: 13:00:00 180 mg via INTRAVENOUS
  Filled 2019-04-10: qty 9

## 2019-04-10 MED ORDER — CLONIDINE HCL 0.1 MG PO TABS
0.1000 mg | ORAL_TABLET | Freq: Once | ORAL | Status: AC
  Administered 2019-04-10: 12:00:00 0.1 mg via ORAL

## 2019-04-10 MED ORDER — SODIUM CHLORIDE 0.9 % IV SOLN
360.0000 mg | Freq: Once | INTRAVENOUS | Status: AC
  Administered 2019-04-10: 360 mg via INTRAVENOUS
  Filled 2019-04-10: qty 36

## 2019-04-10 MED ORDER — SODIUM CHLORIDE 0.9 % IV SOLN
Freq: Once | INTRAVENOUS | Status: AC
  Administered 2019-04-10: 11:00:00 via INTRAVENOUS
  Filled 2019-04-10: qty 5

## 2019-04-10 MED ORDER — ONDANSETRON HCL 8 MG PO TABS: 8.0000 mg | ORAL_TABLET | Freq: Three times a day (TID) | ORAL | 0 refills | Status: DC | PRN

## 2019-04-10 MED ORDER — PALONOSETRON HCL INJECTION 0.25 MG/5ML
INTRAVENOUS | Status: AC
  Filled 2019-04-10: qty 5

## 2019-04-10 MED ORDER — PALONOSETRON HCL INJECTION 0.25 MG/5ML
0.2500 mg | Freq: Once | INTRAVENOUS | Status: AC
  Administered 2019-04-10: 11:00:00 0.25 mg via INTRAVENOUS

## 2019-04-10 MED ORDER — SODIUM CHLORIDE 0.9 % IV SOLN
Freq: Once | INTRAVENOUS | Status: AC
  Administered 2019-04-10: 11:00:00 via INTRAVENOUS
  Filled 2019-04-10: qty 250

## 2019-04-10 MED ORDER — CLONIDINE HCL 0.1 MG PO TABS
ORAL_TABLET | ORAL | Status: AC
  Filled 2019-04-10: qty 1

## 2019-04-10 NOTE — Progress Notes (Signed)
Pt. BP elevated 183/71, pt. denies chest pain, dizziness, and no shortness of breath noted. C. Heilingoepter, PA notified and new orders received. Ok to treat with chemotherapy today.

## 2019-04-10 NOTE — Patient Instructions (Signed)
Oral: 4 mg, followed by 2 mg every 2 to 4 hours or after each loose stool (more persistent diarrhea will require the higher frequency); doses >16 mg/day may not provide benefit Quita Skye 2014) or 4 mg, followed by 2 mg every 4 hours or after each loose stool; Maximum: 16 mg/day Britta Mccreedy 2004; [place] 2012) or 4 mg followed by 2 mg every 2 hours (may utilize 4 mg every 4 hours at night) until 12 hours have passed without a loose bowel movement

## 2019-04-10 NOTE — Progress Notes (Signed)
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 I will add Aloxi + Emend for Day 3- ok'd per Cassie Heilingoetter, PA. No PA required.  Also, dose of Carbo will remain "as-is" per Dr. Julien Nordmann (360 mg; not increasing to 400 mg today).  Kennith Center, Pharm.D., CPP 04/10/2019@11 :32 AM

## 2019-04-10 NOTE — Patient Instructions (Signed)
[facility] Discharge Instructions for Patients Receiving Chemotherapy  Today you received the following chemotherapy agents Carboplatin, Etoposide.  To help prevent nausea and vomiting after your treatment, we encourage you to take your nausea medication as directed by your MD.   If you develop nausea and vomiting that is not controlled by your nausea medication, call the clinic.   BELOW ARE SYMPTOMS THAT SHOULD BE REPORTED IMMEDIATELY:  *FEVER GREATER THAN 100.5 F  *CHILLS WITH OR WITHOUT FEVER  NAUSEA AND VOMITING THAT IS NOT CONTROLLED WITH YOUR NAUSEA MEDICATION  *UNUSUAL SHORTNESS OF BREATH  *UNUSUAL BRUISING OR BLEEDING  TENDERNESS IN MOUTH AND THROAT WITH OR WITHOUT PRESENCE OF ULCERS  *URINARY PROBLEMS  *BOWEL PROBLEMS  UNUSUAL RASH Items with * indicate a potential emergency and should be followed up as soon as possible.  Feel free to call the clinic should you have any questions or concerns. The clinic phone number is [phone].  Please show the [facility] at check-in to the Emergency Department and triage nurse.  Coronavirus (COVID-19) Are you at risk?  Are you at risk for the Coronavirus (COVID-19)?  To be considered HIGH RISK for Coronavirus (COVID-19), you have to meet the following criteria:  . Traveled to [place], [place], [place], [place] or [place]; or in the [place] to [place], [place], [place], or [place]; and have fever, cough, and shortness of breath within the last 2 weeks of travel OR . Been in close contact with a person diagnosed with COVID-19 within the last 2 weeks and have fever, cough, and shortness of breath . IF YOU DO NOT MEET THESE CRITERIA, YOU ARE CONSIDERED LOW RISK FOR COVID-19.  What to do if you are HIGH RISK for COVID-19?  Marland Kitchen If you are having a medical emergency, call 911. . Seek medical care right away. Before you go to a doctor's office, urgent care or emergency department, call ahead and  tell them about your recent travel, contact with someone diagnosed with COVID-19, and your symptoms. You should receive instructions from your physician's office regarding next steps of care.  . When you arrive at healthcare provider, tell the healthcare staff immediately you have returned from visiting [place], [place], [place], [place] or [place]; or traveled in the [place] to [place], [place], [place], or [place]; in the last two weeks or you have been in close contact with a person diagnosed with COVID-19 in the last 2 weeks.   . Tell the health care staff about your symptoms: fever, cough and shortness of breath. . After you have been seen by a medical provider, you will be either: o Tested for (COVID-19) and discharged home on quarantine except to seek medical care if symptoms worsen, and asked to  - Stay home and avoid contact with others until you get your results (4-5 days)  - Avoid travel on public transportation if possible (such as bus, train, or airplane) or o Sent to the Emergency Department by EMS for evaluation, COVID-19 testing, and possible admission depending on your condition and test results.  What to do if you are LOW RISK for COVID-19?  Reduce your risk of any infection by using the same precautions used for avoiding the common cold or flu:  Marland Kitchen Wash your hands often with soap and warm water for at least 20 seconds.  If soap and water are not readily available, use an alcohol-based hand sanitizer with at least 60% alcohol.  Marland Kitchen  If coughing or sneezing, cover your mouth and nose by coughing or sneezing into the elbow areas of your shirt or coat, into a tissue or into your sleeve (not your hands). . Avoid shaking hands with others and consider head nods or verbal greetings only. . Avoid touching your eyes, nose, or mouth with unwashed hands.  . Avoid close contact with people who are sick. . Avoid places or events with large numbers of people in one location, like  concerts or sporting events. . Carefully consider travel plans you have or are making. . If you are planning any travel outside or inside the [place], visit the CDC's Travelers' Health webpage for the latest health notices. . If you have some symptoms but not all symptoms, continue to monitor at home and seek medical attention if your symptoms worsen. . If you are having a medical emergency, call 911.   [facility] / e-Visit: [email or web address]         [facility]: [facility]: [phone]                   [facility]: [phone]

## 2019-04-10 NOTE — Progress Notes (Signed)
[facility] OFFICE PROGRESS NOTE  Tara Peng, NP [place] [address]  DIAGNOSIS: Limited stage (T1c, N2, M0) small cell lung cancer presented with left upper lobe lung nodule in addition to mediastinal lymphadenopathy diagnosed in February 2020.  PRIOR THERAPY: None  CURRENT THERAPY: Systemic chemotherapy with carboplatin for AUC of 5 on day 1 and etoposide at 100 mg/M2 on days 1, 2 and 3 with Neulasta support every 3 weeks. First dose January 16, 2019. Status post cycle 4.  INTERVAL HISTORY: Tara Hopkins 68 y.o. female returns to the clinic for a follow-up visit.  The patient is feeling well today without any concerning complaints. She developed nausea, vomiting, and diarrhea on day 3 of the last cycle of treatment. Her symptoms persisted for 4 days before she went to the ER on 03/27/2019. She had been taking her compazine every 6 hours with no resolution of her nausea. She had about 3 loose bowel movements a day during this time without any associated melena, hematochezia, or abdominal pain. She tried taking 1 pill of imodium daily without relief. Since being discharged from the emergency room, her symptoms have resolved. She received Zofran, Pepcid, and fluids while in the ER. She also had a CT of the abdomen which was unremarkable.  Today, she denies any nausea, vomiting, diarrhea, or constipation. She denies any fever, chills, night sweats, or significant weight loss. She denies any chest pain, shortness of breath, cough, or hemoptysis.  She denies headaches or visual changes. She recently has seen her PCP for management of her hypertension. She states he is considering increasing her dose. She had taken her BP medications today as prescribed. She is here today for evaluation before starting cycle #5.   MEDICAL HISTORY: Past Medical History:  Diagnosis Date  . Anemia   . Anxiety   . Bipolar 1 disorder ([place])   . Cataracts, bilateral   .  Depression   . Diabetes mellitus without complication ([place])   . Glaucoma   . History of blood transfusion   . History of hyperbaric oxygen therapy    pt reports 80 treatments.  . Hypercholesteremia   . Hypertension   . Mediastinal adenopathy   . Peripheral neuropathy   . Pneumonia   . SCL ca Dx'd 12/2018  . Stroke ([place]) 02/2015  . Wears glasses     ALLERGIES:  has No Known Allergies.  MEDICATIONS:  Current Outpatient Medications  Medication Sig Dispense Refill  . amLODipine (NORVASC) 10 MG tablet Take 1 tablet (10 mg total) by mouth daily. 90 tablet 0  . aspirin 325 MG EC tablet Take 1 tablet (325 mg total) by mouth daily. 30 tablet 0  . aspirin EC 81 MG tablet Take 81 mg by mouth daily.    Marland Kitchen atorvastatin (LIPITOR) 40 MG tablet TAKE ONE TABLET BY MOUTH DAILY AT 6 PM (Patient taking differently: Take 40 mg by mouth daily at 6 PM. ) 90 tablet 0  . citalopram (CELEXA) 10 MG tablet Take 1 tablet (10 mg total) by mouth daily. 90 tablet 1  . clonazePAM (KLONOPIN) 0.5 MG tablet TAKE 1 TABLET BY MOUTH THREE TIMES A DAY 90 tablet 0  . gabapentin (NEURONTIN) 400 MG capsule Take 3 capsules (1,200 mg total) by mouth 3 (three) times daily for 30 days. TAKE 3 CAPSULES BY MOUTH THREE TIMES A DAY 270 capsule 3  . glimepiride (AMARYL) 4 MG tablet Take 1 tablet (4 mg total) by mouth daily with breakfast.  90 tablet 0  . glucose blood (ONETOUCH VERIO) test strip USE TO TEST BLOOD GLUCOSE 3 TIMES DAILY 100 each 12  . Insulin Glargine (LANTUS SOLOSTAR) 100 UNIT/ML Solostar Pen Inject 25 Units into the skin at bedtime. (Patient taking differently: Inject 40 Units into the skin at bedtime. ) 15 mL 6  . Insulin Pen Needle (B-D UF III MINI PEN NEEDLES) 31G X 5 MM MISC Use as directed. 100 each 3  . latanoprost (XALATAN) 0.005 % ophthalmic solution INSTILL 1 DROP INTO BOTH EYES EVERY EVENING (Patient taking differently: Place 1 drop into both eyes at bedtime. ) 7.5 mL 0  . lisinopril (PRINIVIL,ZESTRIL) 40 MG  tablet Take 1 tablet (40 mg total) by mouth daily. 90 tablet 1  . ondansetron (ZOFRAN ODT) 4 MG disintegrating tablet Take 1 tablet (4 mg total) by mouth every 8 (eight) hours as needed for nausea or vomiting. 6 tablet 0  . ondansetron (ZOFRAN) 8 MG tablet Take 1 tablet (8 mg total) by mouth every 8 (eight) hours as needed for nausea or vomiting. 30 tablet 0  . ONE TOUCH LANCETS MISC USE TO CHECK BLOOD SUGAR 3 TIMES DAILY 200 each 3  . prochlorperazine (COMPAZINE) 10 MG tablet TAKE ONE TABLET BY MOUTH EVERY 6 HOURS AS NEEDED FOR NAUSEA OR VOMITING (Patient taking differently: Take 10 mg by mouth every 6 (six) hours as needed for nausea or vomiting. ) 30 tablet 0  . traMADol (ULTRAM) 50 MG tablet TAKE ONE TABLET BY MOUTH EVERY 12 HOURS AS NEEDED 60 tablet 0   Current Facility-Administered Medications  Medication Dose Route Frequency Provider Last Rate Last Dose  . cloNIDine (CATAPRES) tablet 0.1 mg  0.1 mg Oral Once Heilingoetter, Cassandra L, PA-C       Facility-Administered Medications Ordered in Other Visits  Medication Dose Route Frequency Provider Last Rate Last Dose  . CARBOplatin (PARAPLATIN) 360 mg in sodium chloride 0.9 % 250 mL chemo infusion  360 mg Intravenous Once Curt Bears, MD      . etoposide (VEPESID) 180 mg in sodium chloride 0.9 % 500 mL chemo infusion  100 mg/m2 (Treatment Plan Recorded) Intravenous Once Curt Bears, MD      . fosaprepitant (EMEND) 150 mg, dexamethasone (DECADRON) 12 mg in sodium chloride 0.9 % 145 mL IVPB   Intravenous Once Curt Bears, MD      . palonosetron (ALOXI) injection 0.25 mg  0.25 mg Intravenous Once Curt Bears, MD        SURGICAL HISTORY:  Past Surgical History:  Procedure Laterality Date  . AMPUTATION Right 12/15/2013   Procedure: Right Transmetatarsal Amputation;  Surgeon: Newt Minion, MD;  Location: [facility];  Service: Orthopedics;  Laterality: Right;  . AMPUTATION Right 02/23/2014   Procedure: AMPUTATION FOOT;  Surgeon:  Newt Minion, MD;  Location: [facility];  Service: Orthopedics;  Laterality: Right;  Right Foot Revision Transmetatarsal Amputation  . CATARACT EXTRACTION W/ INTRAOCULAR LENS  IMPLANT, BILATERAL    . COLONOSCOPY W/ BIOPSIES AND POLYPECTOMY    . I&D EXTREMITY Right 05/23/2013   Procedure: IRRIGATION AND DEBRIDEMENT RIGHT GREAT TOE;  Surgeon: Mauri Pole, MD;  Location: [facility];  Service: Orthopedics;  Laterality: Right;  . TUBAL LIGATION    . VIDEO BRONCHOSCOPY WITH ENDOBRONCHIAL ULTRASOUND N/A 12/22/2018   Procedure: VIDEO BRONCHOSCOPY WITH ENDOBRONCHIAL ULTRASOUND;  Surgeon: [name], DO;  Location: [facility];  Service: Thoracic;  Laterality: N/A;    REVIEW OF SYSTEMS:   Review of Systems  Constitutional: Negative for appetite change, chills, fatigue, fever and unexpected weight change.  HENT:   Negative for mouth sores, nosebleeds, sore throat and trouble swallowing.   Eyes: Negative for eye problems and icterus.  Respiratory: Negative for cough, hemoptysis, shortness of breath and wheezing.   Cardiovascular: Negative for chest pain and leg swelling.  Gastrointestinal: Positive for nausea, vomiting, and diarrhea after her last chemotherapy (resolved). Negative for abdominal pain and constipation. Genitourinary: Negative for bladder incontinence, difficulty urinating, dysuria, frequency and hematuria.   Musculoskeletal: Negative for back pain, gait problem, neck pain and neck stiffness.  Skin: Negative for itching and rash.  Neurological: Negative for dizziness, extremity weakness, gait problem, headaches, light-headedness and seizures.  Hematological: Negative for adenopathy. Does not bruise/bleed easily.  Psychiatric/Behavioral: Negative for confusion, depression and sleep disturbance. The patient is not nervous/anxious.     PHYSICAL EXAMINATION:  Blood pressure (!) 163/76, pulse 65, temperature 98.9 F (37.2 C), temperature source Oral, resp. rate 20, height 5\' 5"  (1.651 m), weight 163  lb 12.8 oz (74.3 kg), SpO2 100 %.  ECOG PERFORMANCE STATUS: 1 - Symptomatic but completely ambulatory  Physical Exam  Constitutional: Oriented to person, place, and time and well-developed, well-nourished, and in no distress. The patient was examined in the wheelchair.  HENT:  Head: Normocephalic and atraumatic.  Mouth/Throat: Oropharynx is clear and moist. No oropharyngeal exudate.  Eyes: Conjunctivae are normal. Right eye exhibits no discharge. Left eye exhibits no discharge. No scleral icterus.  Neck: Normal range of motion. Neck supple.  Cardiovascular: Normal rate, regular rhythm, normal heart sounds and intact distal pulses.   Pulmonary/Chest: Effort normal and breath sounds normal. No respiratory distress. No wheezes. No rales.  Abdominal: Soft. Bowel sounds are normal. Exhibits no distension and no mass. There is no tenderness.  Musculoskeletal: Right Transmetatarsal Amputation. Normal range of motion. Exhibits no edema.  Lymphadenopathy:    No cervical adenopathy.  Neurological: Alert and oriented to person, place, and time. Exhibits normal muscle tone. Gait normal. Coordination normal.  Skin: Skin is warm and dry. No rash noted. Not diaphoretic. No erythema. No pallor.  Psychiatric: Mood, memory and judgment normal.  Vitals reviewed.  LABORATORY DATA: Lab Results  Component Value Date   WBC 13.0 (H) 04/10/2019   HGB 8.7 (L) 04/10/2019   HCT 28.5 (L) 04/10/2019   MCV 96.3 04/10/2019   PLT 303 04/10/2019      Chemistry      Component Value Date/Time   NA 140 04/10/2019 0941   K 4.3 04/10/2019 0941   CL 107 04/10/2019 0941   CO2 25 04/10/2019 0941   BUN 15 04/10/2019 0941   CREATININE 1.17 (H) 04/10/2019 0941      Component Value Date/Time   CALCIUM 9.1 04/10/2019 0941   ALKPHOS 134 (H) 04/10/2019 0941   AST 16 04/10/2019 0941   ALT 19 04/10/2019 0941   BILITOT <0.2 (L) 04/10/2019 0941       RADIOGRAPHIC STUDIES:  Dg Chest 2 View  Result Date:  03/27/2019 CLINICAL DATA:  Lung carcinoma.  Nausea and vomiting EXAM: CHEST - 2 VIEW COMPARISON:  Chest CT Mar 14, 2019; chest radiographic examination October 16, 2018 FINDINGS: There is no evident edema or consolidation. Previously noted right pulmonary nodular lesion not evident by radiography. The heart is slightly increased in size with pulmonary vascularity normal. Adenopathy appreciated on recent CT is not well delineated by radiography. There is degenerative change in the thoracic spine. No blastic or lytic bone lesions evident. IMPRESSION:  No edema or consolidation. Known adenopathy seen on recent CT is not well delineated by chest radiography. Heart is slightly increased in size. Electronically Signed   By: Lowella Grip III M.D.   On: 03/27/2019 10:45   Ct Chest Wo Contrast  Result Date: 03/14/2019 CLINICAL DATA:  Small-cell lung cancer EXAM: CT CHEST WITHOUT CONTRAST TECHNIQUE: Multidetector CT imaging of the chest was performed following the standard protocol without IV contrast. COMPARISON:  PET-CT 12/01/2018.  Chest CT 10/28/2018. FINDINGS: Cardiovascular: The heart size is normal. No substantial pericardial effusion. Coronary artery calcification is evident. Atherosclerotic calcification is noted in the wall of the thoracic aorta. Mediastinum/Nodes: 1.5 cm short axis prevascular lymph node measured on the previous exam has decreased to 0.5 cm in the interval. Index right paratracheal node measured previously at 2.1 cm is now 1.2 cm short axis. 2.8 cm short axis subcarinal lymph node measured previously has decreased in the interval, now measuring 2.1 cm. No bulky hilar lymphadenopathy evident although assessment limited by lack of intravenous contrast material. The esophagus has normal imaging features. There is no axillary lymphadenopathy. Lungs/Pleura: The central tracheobronchial airways are patent. Centrilobular emphsyema noted. Mild bronchiectasis noted right middle lobe, lingula, and both  lower lobes.10 mm posterior right upper lobe pulmonary nodule identified on the previous study has decreased, measuring 4 mm today (58/7). No new suspicious nodule or mass. No pleural effusion. Upper Abdomen: 9 mm left adrenal nodule is similar to prior PET-CT. No evidence for hypermetabolism on that study. Musculoskeletal: No worrisome lytic or sclerotic osseous abnormality. IMPRESSION: 1. Interval decrease in mediastinal lymphadenopathy. 2. Interval decrease in the posterior right upper lobe pulmonary nodule. 3. Stable 9 mm left adrenal nodule, likely adenoma. 4. No new or progressive findings on today's study. 5.  Aortic Atherosclerois (ICD10-170.0) 6.  Emphysema. ([id number]) Electronically Signed   By: Misty Stanley M.D.   On: 03/14/2019 13:21   Ct Abdomen Pelvis W Contrast  Result Date: 03/27/2019 CLINICAL DATA:  Diarrhea, nausea, and vomiting. EXAM: CT ABDOMEN AND PELVIS WITH CONTRAST TECHNIQUE: Multidetector CT imaging of the abdomen and pelvis was performed using the standard protocol following bolus administration of intravenous contrast. CONTRAST:  116mL OMNIPAQUE IOHEXOL 300 MG/ML  SOLN COMPARISON:  PET-CT 12/01/2018 FINDINGS: Lower chest: Coronary calcifications seen along the lateral left ventricle. Hepatobiliary: No focal liver abnormality.No evidence of biliary obstruction or stone. Pancreas: Unremarkable. Spleen: Unremarkable. Adrenals/Urinary Tract: Negative adrenals. No hydronephrosis or stone. Unremarkable bladder. Stomach/Bowel: No obstruction. No appendicitis. Formed stool throughout much of the colon. Vascular/Lymphatic: No acute vascular abnormality. Moderate atherosclerotic calcification. No mass or adenopathy. Reproductive:Negative. Other: No ascites or pneumoperitoneum. Musculoskeletal: No acute abnormalities. Advanced disc degeneration at L2-3 and L3-4. IMPRESSION: No acute finding or explanation for symptoms. Electronically Signed   By: Monte Fantasia M.D.   On: 03/27/2019 11:39      ASSESSMENT/PLAN:  This is a very pleasant 68 year old African-American female recently diagnosed with limited stage (T1c, N2, M0)small cell lung cancer who presented with a right upper lobe lung nodule in addition to mediastinal lymphadenopathy. She was diagnosed in February 2020.  She is currently undergoing chemotherapy with carboplatin for an AUC of 5 on days 1 and etoposide100 mg/m2on days 1, 2, and 3 with Neulasta support every 3 weeks. She is status post 4 cycles. The patient developed nausea, vomiting, and diarrhea after cycle #1 and #4. She went to the emergency room after her most recent cycle of treatment. She also is recieving Onpro due to convenience and the  COVID pandemic.   The patient was seen with Dr. Julien Nordmann today. Labs were reviewed. We recommend that she proceed with cycle #5 today as scheduled.  Her restaging CT scan was performed after cycle #3 due to transportation difficulties. We will obtain a restaging CT scan after cycle #6.  I will see her back for a follow up visit in 3 weeks for evaluation before starting cycle #6.   For the nausea and vomiting, the patient's medications will be adjusted on day 3 of infusion per pharmacy recommendation. She will also continue compazine at home every 6 hours as needed.   I counseled the patient on optimal use of imodium. I provided her a handout of the instructions for dosing in the AVS. I discussed that she may take 4 mg, followed by 2 mg every 2 to 4 hours or after each loose stool for a max of 16 mg/day.  The patient's blood pressure was noted to be elevated again today. She is currently in contact with her PCP regarding optimizing her blood pressure medications. We will administer a once time dose clonidine during infusion today for her blood pressure.   The patient was advised to call immediately if she has any concerning symptoms in the interval. The patient voices understanding of current disease status and treatment  options and is in agreement with the current care plan. All questions were answered. The patient knows to call the clinic with any problems, questions or concerns. We can certainly see the patient much sooner if necessary  No orders of the defined types were placed in this encounter.    Cassandra L Heilingoetter, PA-C 04/10/19  ADDENDUM: Hematology/Oncology Attending: I had a face-to-face encounter with the patient.  I recommended her care plan.  This is a very pleasant 68 years old African-American female with limited stage small cell lung cancer.  She is currently undergoing systemic chemotherapy with carboplatin 2 etoposide status post 4 cycles.  She has been tolerating this treatment well except for delayed nausea and vomiting.  She was seen recently at the emergency department with dehydration from the nausea and vomiting.  She is feeling much better today.  We will proceed with cycle #5 today as planned. For the delayed nausea we will add Aloxi to her premedication. The patient will come back for follow-up visit in 3 weeks for evaluation before the next cycle of her treatment. She was advised to call immediately if she has any concerning symptoms in the interval.  Disclaimer: This note was dictated with voice recognition software. Similar sounding words can inadvertently be transcribed and may be missed upon review. Eilleen Kempf, MD 04/10/19

## 2019-04-11 ENCOUNTER — Inpatient Hospital Stay: Payer: HMO

## 2019-04-11 ENCOUNTER — Telehealth: Payer: Self-pay | Admitting: Internal Medicine

## 2019-04-11 ENCOUNTER — Other Ambulatory Visit: Payer: Self-pay

## 2019-04-11 ENCOUNTER — Other Ambulatory Visit: Payer: Self-pay | Admitting: Internal Medicine

## 2019-04-11 ENCOUNTER — Other Ambulatory Visit: Payer: Self-pay | Admitting: Adult Health

## 2019-04-11 VITALS — BP 145/71 | HR 61 | Temp 98.7°F | Resp 20

## 2019-04-11 DIAGNOSIS — Z5111 Encounter for antineoplastic chemotherapy: Secondary | ICD-10-CM | POA: Diagnosis not present

## 2019-04-11 DIAGNOSIS — F32A Depression, unspecified: Secondary | ICD-10-CM

## 2019-04-11 DIAGNOSIS — Z760 Encounter for issue of repeat prescription: Secondary | ICD-10-CM

## 2019-04-11 DIAGNOSIS — C3411 Malignant neoplasm of upper lobe, right bronchus or lung: Secondary | ICD-10-CM

## 2019-04-11 DIAGNOSIS — F329 Major depressive disorder, single episode, unspecified: Secondary | ICD-10-CM

## 2019-04-11 MED ORDER — SODIUM CHLORIDE 0.9 % IV SOLN
100.0000 mg/m2 | Freq: Once | INTRAVENOUS | Status: AC
  Administered 2019-04-11: 10:00:00 180 mg via INTRAVENOUS
  Filled 2019-04-11: qty 9

## 2019-04-11 MED ORDER — DEXAMETHASONE SODIUM PHOSPHATE 10 MG/ML IJ SOLN
10.0000 mg | Freq: Once | INTRAMUSCULAR | Status: AC
  Administered 2019-04-11: 10 mg via INTRAVENOUS

## 2019-04-11 MED ORDER — DEXAMETHASONE SODIUM PHOSPHATE 10 MG/ML IJ SOLN
INTRAMUSCULAR | Status: AC
  Filled 2019-04-11: qty 1

## 2019-04-11 MED ORDER — SODIUM CHLORIDE 0.9 % IV SOLN
Freq: Once | INTRAVENOUS | Status: AC
  Administered 2019-04-11: 09:00:00 via INTRAVENOUS
  Filled 2019-04-11: qty 250

## 2019-04-11 NOTE — Patient Instructions (Signed)
[facility] Discharge Instructions for Patients Receiving Chemotherapy  Today you received the following chemotherapy agents:  Etoposide.  To help prevent nausea and vomiting after your treatment, we encourage you to take your nausea medication as directed.   If you develop nausea and vomiting that is not controlled by your nausea medication, call the clinic.   BELOW ARE SYMPTOMS THAT SHOULD BE REPORTED IMMEDIATELY:  *FEVER GREATER THAN 100.5 F  *CHILLS WITH OR WITHOUT FEVER  NAUSEA AND VOMITING THAT IS NOT CONTROLLED WITH YOUR NAUSEA MEDICATION  *UNUSUAL SHORTNESS OF BREATH  *UNUSUAL BRUISING OR BLEEDING  TENDERNESS IN MOUTH AND THROAT WITH OR WITHOUT PRESENCE OF ULCERS  *URINARY PROBLEMS  *BOWEL PROBLEMS  UNUSUAL RASH Items with * indicate a potential emergency and should be followed up as soon as possible.  Feel free to call the clinic should you have any questions or concerns. The clinic phone number is [phone].  Please show the [facility] at check-in to the Emergency Department and triage nurse.

## 2019-04-11 NOTE — Addendum Note (Signed)
Addended by: Apolinar Junes on: 04/11/2019 03:01 PM   Modules accepted: Orders

## 2019-04-11 NOTE — Telephone Encounter (Signed)
Cancelled 6/27 appt per sch msg. Called and left msg

## 2019-04-11 NOTE — Addendum Note (Signed)
Addended by: Apolinar Junes on: 04/11/2019 02:58 PM   Modules accepted: Orders

## 2019-04-12 ENCOUNTER — Other Ambulatory Visit: Payer: Self-pay

## 2019-04-12 ENCOUNTER — Inpatient Hospital Stay: Payer: HMO

## 2019-04-12 VITALS — BP 148/78 | HR 54 | Temp 99.1°F | Resp 16

## 2019-04-12 DIAGNOSIS — C3411 Malignant neoplasm of upper lobe, right bronchus or lung: Secondary | ICD-10-CM

## 2019-04-12 DIAGNOSIS — Z5111 Encounter for antineoplastic chemotherapy: Secondary | ICD-10-CM | POA: Diagnosis not present

## 2019-04-12 MED ORDER — PEGFILGRASTIM 6 MG/0.6ML ~~LOC~~ PSKT
PREFILLED_SYRINGE | SUBCUTANEOUS | Status: AC
  Filled 2019-04-12: qty 0.6

## 2019-04-12 MED ORDER — SODIUM CHLORIDE 0.9 % IV SOLN
100.0000 mg/m2 | Freq: Once | INTRAVENOUS | Status: AC
  Administered 2019-04-12: 180 mg via INTRAVENOUS
  Filled 2019-04-12: qty 9

## 2019-04-12 MED ORDER — SODIUM CHLORIDE 0.9 % IV SOLN
Freq: Once | INTRAVENOUS | Status: AC
  Administered 2019-04-12: 11:00:00 via INTRAVENOUS
  Filled 2019-04-12: qty 5

## 2019-04-12 MED ORDER — PALONOSETRON HCL INJECTION 0.25 MG/5ML
0.2500 mg | Freq: Once | INTRAVENOUS | Status: AC
  Administered 2019-04-12: 11:00:00 0.25 mg via INTRAVENOUS

## 2019-04-12 MED ORDER — PALONOSETRON HCL INJECTION 0.25 MG/5ML
INTRAVENOUS | Status: AC
  Filled 2019-04-12: qty 5

## 2019-04-12 MED ORDER — SODIUM CHLORIDE 0.9 % IV SOLN
Freq: Once | INTRAVENOUS | Status: AC
  Administered 2019-04-12: 11:00:00 via INTRAVENOUS
  Filled 2019-04-12: qty 250

## 2019-04-12 MED ORDER — PEGFILGRASTIM 6 MG/0.6ML ~~LOC~~ PSKT
6.0000 mg | PREFILLED_SYRINGE | Freq: Once | SUBCUTANEOUS | Status: AC
  Administered 2019-04-12: 13:00:00 6 mg via SUBCUTANEOUS

## 2019-04-12 NOTE — Patient Instructions (Signed)
[place] Discharge Instructions for Patients Receiving Chemotherapy  Today you received the following chemotherapy agents:  Etoposide.  To help prevent nausea and vomiting after your treatment, we encourage you to take your nausea medication as directed.   If you develop nausea and vomiting that is not controlled by your nausea medication, call the clinic.   BELOW ARE SYMPTOMS THAT SHOULD BE REPORTED IMMEDIATELY:  *FEVER GREATER THAN 100.5 F  *CHILLS WITH OR WITHOUT FEVER  NAUSEA AND VOMITING THAT IS NOT CONTROLLED WITH YOUR NAUSEA MEDICATION  *UNUSUAL SHORTNESS OF BREATH  *UNUSUAL BRUISING OR BLEEDING  TENDERNESS IN MOUTH AND THROAT WITH OR WITHOUT PRESENCE OF ULCERS  *URINARY PROBLEMS  *BOWEL PROBLEMS  UNUSUAL RASH Items with * indicate a potential emergency and should be followed up as soon as possible.  Feel free to call the clinic should you have any questions or concerns. The clinic phone number is [phone].  Please show the [place] at check-in to the Emergency Department and triage nurse.

## 2019-04-13 ENCOUNTER — Ambulatory Visit: Payer: Self-pay

## 2019-04-14 ENCOUNTER — Ambulatory Visit: Payer: Self-pay

## 2019-04-17 ENCOUNTER — Telehealth: Payer: Self-pay | Admitting: Adult Health

## 2019-04-17 ENCOUNTER — Other Ambulatory Visit: Payer: Self-pay

## 2019-04-17 ENCOUNTER — Inpatient Hospital Stay: Payer: HMO

## 2019-04-17 DIAGNOSIS — Z5111 Encounter for antineoplastic chemotherapy: Secondary | ICD-10-CM | POA: Diagnosis not present

## 2019-04-17 DIAGNOSIS — C3411 Malignant neoplasm of upper lobe, right bronchus or lung: Secondary | ICD-10-CM

## 2019-04-17 LAB — CBC WITH DIFFERENTIAL (CANCER CENTER ONLY)
Abs Immature Granulocytes: 0 10*3/uL (ref 0.00–0.07)
Basophils Absolute: 0 10*3/uL (ref 0.0–0.1)
Basophils Relative: 0 %
Eosinophils Absolute: 0 10*3/uL (ref 0.0–0.5)
Eosinophils Relative: 0 %
HCT: 25.8 % — ABNORMAL LOW (ref 36.0–46.0)
Hemoglobin: 7.8 g/dL — ABNORMAL LOW (ref 12.0–15.0)
Lymphocytes Relative: 16 %
Lymphs Abs: 1.4 10*3/uL (ref 0.7–4.0)
MCH: 29.7 pg (ref 26.0–34.0)
MCHC: 30.2 g/dL (ref 30.0–36.0)
MCV: 98.1 fL (ref 80.0–100.0)
Monocytes Absolute: 0.1 10*3/uL (ref 0.1–1.0)
Monocytes Relative: 1 %
Neutro Abs: 7.1 10*3/uL (ref 1.7–17.7)
Neutrophils Relative %: 83 %
Platelet Count: 201 10*3/uL (ref 150–400)
RBC: 2.63 MIL/uL — ABNORMAL LOW (ref 3.87–5.11)
RDW: 23.2 % — ABNORMAL HIGH (ref 11.5–15.5)
WBC Count: 8.5 10*3/uL (ref 4.0–10.5)
nRBC: 0 % (ref 0.0–0.2)

## 2019-04-17 LAB — CMP (CANCER CENTER ONLY)
ALT: 16 U/L (ref 0–44)
AST: 15 U/L (ref 15–41)
Albumin: 3.3 g/dL — ABNORMAL LOW (ref 3.5–5.0)
Alkaline Phosphatase: 135 U/L — ABNORMAL HIGH (ref 38–126)
Anion gap: 10 (ref 5–15)
BUN: 31 mg/dL — ABNORMAL HIGH (ref 8–23)
CO2: 21 mmol/L — ABNORMAL LOW (ref 22–32)
Calcium: 8.3 mg/dL — ABNORMAL LOW (ref 8.9–10.3)
Chloride: 108 mmol/L (ref 98–111)
Creatinine: 1.42 mg/dL — ABNORMAL HIGH (ref 0.44–1.00)
GFR, Est AFR Am: 44 mL/min — ABNORMAL LOW (ref >60)
GFR, Estimated: 38 mL/min — ABNORMAL LOW (ref >60)
Glucose, Bld: 312 mg/dL — ABNORMAL HIGH (ref 70–99)
Potassium: 5 mmol/L (ref 3.5–5.1)
Sodium: 139 mmol/L (ref 135–145)
Total Bilirubin: 0.3 mg/dL (ref 0.3–1.2)
Total Protein: 6.1 g/dL — ABNORMAL LOW (ref 6.5–8.1)

## 2019-04-17 NOTE — Telephone Encounter (Signed)
Copied from [facility] [id number]. Topic: General - Other >> Apr 17, 2019 11:19 AM Lennox Solders wrote: Reason for CRM: pt is calling and she is taking tramadol 3 pills a day instead of 2 pills also pt is taking gabapentin 7 pills a day. Pt has increase the medications due to pain. [place]

## 2019-04-18 NOTE — Telephone Encounter (Signed)
Spoke to the pt and she is now scheduled for follow up on 04/19/2019 with [facility] to discuss changes in medication.  Nothing further needed.

## 2019-04-18 NOTE — Telephone Encounter (Signed)
Lets set up a phone call visit

## 2019-04-19 ENCOUNTER — Other Ambulatory Visit: Payer: Self-pay

## 2019-04-19 ENCOUNTER — Ambulatory Visit (INDEPENDENT_AMBULATORY_CARE_PROVIDER_SITE_OTHER): Payer: HMO | Admitting: Adult Health

## 2019-04-19 ENCOUNTER — Encounter: Payer: Self-pay | Admitting: Adult Health

## 2019-04-19 DIAGNOSIS — M792 Neuralgia and neuritis, unspecified: Secondary | ICD-10-CM

## 2019-04-19 DIAGNOSIS — Z760 Encounter for issue of repeat prescription: Secondary | ICD-10-CM

## 2019-04-19 MED ORDER — GABAPENTIN 300 MG PO CAPS: 300.0000 mg | ORAL_CAPSULE | Freq: Three times a day (TID) | ORAL | 1 refills | Status: DC

## 2019-04-19 MED ORDER — TRAMADOL HCL 50 MG PO TABS: 50.0000 mg | ORAL_TABLET | Freq: Three times a day (TID) | ORAL | 2 refills | Status: AC

## 2019-04-19 NOTE — Progress Notes (Signed)
Virtual Visit via Telephone Note  I connected with Tara Hopkins on 04/19/19 at  1:00 PM EDT by telephone and verified that I am speaking with the correct person using two identifiers.   I discussed the limitations, risks, security and privacy concerns of performing an evaluation and management service by telephone and the availability of in person appointments. I also discussed with the patient that there may be a patient responsible charge related to this service. The patient expressed understanding and agreed to proceed.  Location patient: home Location provider: work or home office Participants present for the call: patient, provider Patient did not have a visit in the prior 7 days to address this/these issue(s).   History of Present Illness: 68 year old female who  has a past medical history of Anemia, Anxiety, Bipolar 1 disorder ([place]), Cataracts, bilateral, Depression, Diabetes mellitus without complication ([place]), Glaucoma, History of blood transfusion, History of hyperbaric oxygen therapy, Hypercholesteremia, Hypertension, Mediastinal adenopathy, Peripheral neuropathy, Pneumonia, SCL ca (Dx'd 12/2018), Stroke ([place]) (02/2015), and Wears glasses.  She is being evaluated today for worsening neuropathic pain in bilateral upper and lower extremities.  He has taken it upon herself to titrate gabapentin and and tramadol.  Currently she is taking tramadol 50 mg 3 times daily ( up from BID) and has increased gabapentin to 800 mg 3 times daily ( up from 400 mg TID).  She does report that this helps with her pain more than her current dosing   Lab Results  Component Value Date   CREATININE 1.42 (H) 04/17/2019   BUN 31 (H) 04/17/2019   NA 139 04/17/2019   K 5.0 04/17/2019   CL 108 04/17/2019   CO2 21 (L) 04/17/2019      Observations/Objective: Patient sounds cheerful and well on the phone. I do not appreciate any SOB. Speech and thought processing are grossly intact. Patient reported  vitals:  Assessment and Plan: 1. Neuropathic pain -Advised against titrating medication on her own.  I will increase tramadol to 50 mg 3 times daily.  Due to most recent kidney function will need to decrease gabapentin to 300 mg 3 times daily.  She was advised to follow-up in 1 month for further evaluation and labs - gabapentin (NEURONTIN) 300 MG capsule; Take 1 capsule (300 mg total) by mouth 3 (three) times daily.  Dispense: 270 capsule; Refill: 1 - traMADol (ULTRAM) 50 MG tablet; Take 1 tablet (50 mg total) by mouth 3 (three) times daily for 30 days.  Dispense: 90 tablet; Refill: 2   Follow Up Instructions:  I did not refer this patient for an OV in the next 24 hours for this/these issue(s).  I discussed the assessment and treatment plan with the patient. The patient was provided an opportunity to ask questions and all were answered. The patient agreed with the plan and demonstrated an understanding of the instructions.   The patient was advised to call back or seek an in-person evaluation if the symptoms worsen or if the condition fails to improve as anticipated.  I provided 25 minutes of non-face-to-face time during this encounter.   [name], NP

## 2019-04-21 ENCOUNTER — Other Ambulatory Visit: Payer: Self-pay | Admitting: Medical Oncology

## 2019-04-21 DIAGNOSIS — C3411 Malignant neoplasm of upper lobe, right bronchus or lung: Secondary | ICD-10-CM

## 2019-04-24 ENCOUNTER — Inpatient Hospital Stay: Payer: HMO

## 2019-04-26 ENCOUNTER — Telehealth: Payer: Self-pay | Admitting: *Deleted

## 2019-04-26 NOTE — Telephone Encounter (Signed)
Received call from patient to confirm her upcoming appts.  Reviewed them with her. She states she is planning on coming.  She cancelled appts on 04/24/19.

## 2019-04-29 DIAGNOSIS — M869 Osteomyelitis, unspecified: Secondary | ICD-10-CM | POA: Diagnosis not present

## 2019-04-29 DIAGNOSIS — I639 Cerebral infarction, unspecified: Secondary | ICD-10-CM | POA: Diagnosis not present

## 2019-04-29 DIAGNOSIS — E1142 Type 2 diabetes mellitus with diabetic polyneuropathy: Secondary | ICD-10-CM | POA: Diagnosis not present

## 2019-04-29 DIAGNOSIS — G8194 Hemiplegia, unspecified affecting left nondominant side: Secondary | ICD-10-CM | POA: Diagnosis not present

## 2019-05-01 ENCOUNTER — Telehealth: Payer: Self-pay | Admitting: Adult Health

## 2019-05-01 DIAGNOSIS — Z760 Encounter for issue of repeat prescription: Secondary | ICD-10-CM

## 2019-05-01 DIAGNOSIS — F329 Major depressive disorder, single episode, unspecified: Secondary | ICD-10-CM

## 2019-05-01 DIAGNOSIS — M792 Neuralgia and neuritis, unspecified: Secondary | ICD-10-CM

## 2019-05-01 DIAGNOSIS — F32A Depression, unspecified: Secondary | ICD-10-CM

## 2019-05-01 DIAGNOSIS — I10 Essential (primary) hypertension: Secondary | ICD-10-CM

## 2019-05-01 DIAGNOSIS — E118 Type 2 diabetes mellitus with unspecified complications: Secondary | ICD-10-CM

## 2019-05-01 NOTE — Telephone Encounter (Signed)
P PCRM for notification. See Telephone encounter for: 05/01/19.  Patient is calling to see if a list of her medications [facility]  [id number] [address], [place]. [phone] (Phone) [phone] (Fax)

## 2019-05-02 ENCOUNTER — Inpatient Hospital Stay: Payer: HMO

## 2019-05-02 ENCOUNTER — Inpatient Hospital Stay (HOSPITAL_BASED_OUTPATIENT_CLINIC_OR_DEPARTMENT_OTHER): Payer: HMO | Admitting: Internal Medicine

## 2019-05-02 ENCOUNTER — Encounter: Payer: Self-pay | Admitting: Internal Medicine

## 2019-05-02 ENCOUNTER — Other Ambulatory Visit: Payer: Self-pay

## 2019-05-02 VITALS — BP 158/86 | HR 81 | Temp 98.7°F | Resp 18 | Ht 65.0 in | Wt 168.0 lb

## 2019-05-02 DIAGNOSIS — Z5111 Encounter for antineoplastic chemotherapy: Secondary | ICD-10-CM

## 2019-05-02 DIAGNOSIS — I10 Essential (primary) hypertension: Secondary | ICD-10-CM

## 2019-05-02 DIAGNOSIS — C3412 Malignant neoplasm of upper lobe, left bronchus or lung: Secondary | ICD-10-CM | POA: Diagnosis not present

## 2019-05-02 DIAGNOSIS — C3411 Malignant neoplasm of upper lobe, right bronchus or lung: Secondary | ICD-10-CM

## 2019-05-02 LAB — CBC WITH DIFFERENTIAL (CANCER CENTER ONLY)
Abs Immature Granulocytes: 0.07 10*3/uL (ref 0.00–0.07)
Basophils Absolute: 0 10*3/uL (ref 0.0–0.1)
Basophils Relative: 0 %
Eosinophils Absolute: 0.1 10*3/uL (ref 0.0–0.5)
Eosinophils Relative: 1 %
HCT: 28.4 % — ABNORMAL LOW (ref 36.0–46.0)
Hemoglobin: 8.9 g/dL — ABNORMAL LOW (ref 12.0–15.0)
Immature Granulocytes: 1 %
Lymphocytes Relative: 17 %
Lymphs Abs: 2.1 10*3/uL (ref 0.7–4.0)
MCH: 31.1 pg (ref 26.0–34.0)
MCHC: 31.3 g/dL (ref 30.0–36.0)
MCV: 99.3 fL (ref 80.0–100.0)
Monocytes Absolute: 0.6 10*3/uL (ref 0.1–1.0)
Monocytes Relative: 5 %
Neutro Abs: 9.6 10*3/uL — ABNORMAL HIGH (ref 1.7–7.7)
Neutrophils Relative %: 76 %
Platelet Count: 316 10*3/uL (ref 150–400)
RBC: 2.86 MIL/uL — ABNORMAL LOW (ref 3.87–5.11)
RDW: 22.4 % — ABNORMAL HIGH (ref 11.5–15.5)
WBC Count: 12.4 10*3/uL — ABNORMAL HIGH (ref 4.0–10.5)
nRBC: 0.2 % (ref 0.0–0.2)

## 2019-05-02 LAB — CMP (CANCER CENTER ONLY)
ALT: 22 U/L (ref 0–44)
AST: 16 U/L (ref 15–41)
Albumin: 3.5 g/dL (ref 3.5–5.0)
Alkaline Phosphatase: 142 U/L — ABNORMAL HIGH (ref 38–126)
Anion gap: 9 (ref 5–15)
BUN: 14 mg/dL (ref 8–23)
CO2: 24 mmol/L (ref 22–32)
Calcium: 8.5 mg/dL — ABNORMAL LOW (ref 8.9–10.3)
Chloride: 104 mmol/L (ref 98–111)
Creatinine: 1.43 mg/dL — ABNORMAL HIGH (ref 0.44–1.00)
GFR, Est AFR Am: 44 mL/min — ABNORMAL LOW (ref >60)
GFR, Estimated: 38 mL/min — ABNORMAL LOW (ref >60)
Glucose, Bld: 335 mg/dL — ABNORMAL HIGH (ref 70–99)
Potassium: 4.6 mmol/L (ref 3.5–5.1)
Sodium: 137 mmol/L (ref 135–145)
Total Bilirubin: <0.2 mg/dL — ABNORMAL LOW (ref 0.3–1.2)
Total Protein: 6.7 g/dL (ref 6.5–8.1)

## 2019-05-02 MED ORDER — SODIUM CHLORIDE 0.9 % IV SOLN
100.0000 mg/m2 | Freq: Once | INTRAVENOUS | Status: AC
  Administered 2019-05-02: 180 mg via INTRAVENOUS
  Filled 2019-05-02: qty 9

## 2019-05-02 MED ORDER — PALONOSETRON HCL INJECTION 0.25 MG/5ML
INTRAVENOUS | Status: AC
  Filled 2019-05-02: qty 5

## 2019-05-02 MED ORDER — SODIUM CHLORIDE 0.9 % IV SOLN
Freq: Once | INTRAVENOUS | Status: AC
  Administered 2019-05-02: 15:00:00 via INTRAVENOUS
  Filled 2019-05-02: qty 5

## 2019-05-02 MED ORDER — SODIUM CHLORIDE 0.9 % IV SOLN
340.0000 mg | Freq: Once | INTRAVENOUS | Status: AC
  Administered 2019-05-02: 340 mg via INTRAVENOUS
  Filled 2019-05-02: qty 34

## 2019-05-02 MED ORDER — SODIUM CHLORIDE 0.9 % IV SOLN
Freq: Once | INTRAVENOUS | Status: AC
  Administered 2019-05-02: 15:00:00 via INTRAVENOUS
  Filled 2019-05-02: qty 250

## 2019-05-02 MED ORDER — PALONOSETRON HCL INJECTION 0.25 MG/5ML
0.2500 mg | Freq: Once | INTRAVENOUS | Status: AC
  Administered 2019-05-02: 0.25 mg via INTRAVENOUS

## 2019-05-02 NOTE — Telephone Encounter (Signed)
Left a message for a return call.  Need to know reason for faxing medication list.

## 2019-05-02 NOTE — Progress Notes (Signed)
Nutrition Follow-up:  RD working remotely.  Patient with small cell lung cancer.  Patient followed by Dr. Julien Nordmann.  Patient receiving chemotherapy.    Spoke with patient via phone.  Patient reports that appetite is overall good but does go down at times.  Reports that she has not had any issues with nausea in the last few weeks.  Typically eats breakfast and supper and has shake (boost or glucerna) for lunch.       Medications: reviewed  Labs: reviewed  Anthropometrics:   Weight 163 lb on 6/1 stable from April   NUTRITION DIAGNOSIS: Unintentional weight loss stable   INTERVENTION:  Encouraged patient to continue with good sources of protein and drinking oral nutrition supplement to maintain weight    MONITORING, EVALUATION, GOAL: Patient will tolerate adequate calories and protein for weight maintenance   NEXT VISIT: phone f/u August 25  Cattleya Dobratz B. Zenia Resides, [place], [place] Registered Dietitian [phone] (pager)

## 2019-05-02 NOTE — Patient Instructions (Signed)
[facility] Discharge Instructions for Patients Receiving Chemotherapy  Today you received the following chemotherapy agents Carboplatin, Etoposide.  To help prevent nausea and vomiting after your treatment, we encourage you to take your nausea medication as directed by your MD.   If you develop nausea and vomiting that is not controlled by your nausea medication, call the clinic.   BELOW ARE SYMPTOMS THAT SHOULD BE REPORTED IMMEDIATELY:  *FEVER GREATER THAN 100.5 F  *CHILLS WITH OR WITHOUT FEVER  NAUSEA AND VOMITING THAT IS NOT CONTROLLED WITH YOUR NAUSEA MEDICATION  *UNUSUAL SHORTNESS OF BREATH  *UNUSUAL BRUISING OR BLEEDING  TENDERNESS IN MOUTH AND THROAT WITH OR WITHOUT PRESENCE OF ULCERS  *URINARY PROBLEMS  *BOWEL PROBLEMS  UNUSUAL RASH Items with * indicate a potential emergency and should be followed up as soon as possible.  Feel free to call the clinic should you have any questions or concerns. The clinic phone number is [phone].  Please show the [facility] at check-in to the Emergency Department and triage nurse.  Coronavirus (COVID-19) Are you at risk?  Are you at risk for the Coronavirus (COVID-19)?  To be considered HIGH RISK for Coronavirus (COVID-19), you have to meet the following criteria:  . Traveled to [place], [place], [place], [place] or [place]; or in the [place] to [place], [place], [place], or [place]; and have fever, cough, and shortness of breath within the last 2 weeks of travel OR . Been in close contact with a person diagnosed with COVID-19 within the last 2 weeks and have fever, cough, and shortness of breath . IF YOU DO NOT MEET THESE CRITERIA, YOU ARE CONSIDERED LOW RISK FOR COVID-19.  What to do if you are HIGH RISK for COVID-19?  Marland Kitchen If you are having a medical emergency, call 911. . Seek medical care right away. Before you go to a doctor's office, urgent care or emergency department, call ahead and  tell them about your recent travel, contact with someone diagnosed with COVID-19, and your symptoms. You should receive instructions from your physician's office regarding next steps of care.  . When you arrive at healthcare provider, tell the healthcare staff immediately you have returned from visiting [place], [place], [place], [place] or [place]; or traveled in the [place] to [place], [place], [place], or [place]; in the last two weeks or you have been in close contact with a person diagnosed with COVID-19 in the last 2 weeks.   . Tell the health care staff about your symptoms: fever, cough and shortness of breath. . After you have been seen by a medical provider, you will be either: o Tested for (COVID-19) and discharged home on quarantine except to seek medical care if symptoms worsen, and asked to  - Stay home and avoid contact with others until you get your results (4-5 days)  - Avoid travel on public transportation if possible (such as bus, train, or airplane) or o Sent to the Emergency Department by EMS for evaluation, COVID-19 testing, and possible admission depending on your condition and test results.  What to do if you are LOW RISK for COVID-19?  Reduce your risk of any infection by using the same precautions used for avoiding the common cold or flu:  Marland Kitchen Wash your hands often with soap and warm water for at least 20 seconds.  If soap and water are not readily available, use an alcohol-based hand sanitizer with at least 60% alcohol.  Marland Kitchen  If coughing or sneezing, cover your mouth and nose by coughing or sneezing into the elbow areas of your shirt or coat, into a tissue or into your sleeve (not your hands). . Avoid shaking hands with others and consider head nods or verbal greetings only. . Avoid touching your eyes, nose, or mouth with unwashed hands.  . Avoid close contact with people who are sick. . Avoid places or events with large numbers of people in one location, like  concerts or sporting events. . Carefully consider travel plans you have or are making. . If you are planning any travel outside or inside the [place], visit the CDC's Travelers' Health webpage for the latest health notices. . If you have some symptoms but not all symptoms, continue to monitor at home and seek medical attention if your symptoms worsen. . If you are having a medical emergency, call 911.   [place] / e-Visit: [email or web address]         [facility]: [facility]: [phone]                   [facility]: [phone]

## 2019-05-02 NOTE — Telephone Encounter (Signed)
CRM created. 

## 2019-05-02 NOTE — Telephone Encounter (Signed)
Patient states medication list needs to be faxed over (per pharmacy) because she is in the process of switching pharmacy from [facility] to [facility].

## 2019-05-02 NOTE — Progress Notes (Signed)
Center [phone]   [phone]  OFFICE PROGRESS NOTE  [name], NP [place] [address]  DIAGNOSIS: Limited stage (T1c, N2, M0) small cell lung cancer presented with left upper lobe lung nodule in addition to mediastinal lymphadenopathy diagnosed in February 2020.  PRIOR THERAPY: None  CURRENT THERAPY: Systemic chemotherapy with carboplatin for AUC of 5 on day 1 and etoposide at 100 mg/M2 on days 1, 2 and 3 with Neulasta support every 3 weeks. First dose January 16, 2019. Status post cycle5.  INTERVAL HISTORY: Tara Hopkins 68 y.o. female returns to the clinic today for follow-up visit.  The patient is feeling fine today with no concerning complaints except for mild fatigue.  She is a little bit depressed after several family member died recently from different causes including heart, dementia as well as cancer.  The patient denied having any chest pain, shortness of breath, cough or hemoptysis.  She denied having any fever or chills.  She has no nausea, vomiting, diarrhea or constipation.  She tolerated the last cycle of her treatment well with no concerning adverse effects.  She is here today for evaluation before starting cycle #6.  MEDICAL HISTORY: Past Medical History:  Diagnosis Date  . Anemia   . Anxiety   . Bipolar 1 disorder ([facility])   . Cataracts, bilateral   . Depression   . Diabetes mellitus without complication ([facility])   . Glaucoma   . History of blood transfusion   . History of hyperbaric oxygen therapy    pt reports 80 treatments.  . Hypercholesteremia   . Hypertension   . Mediastinal adenopathy   . Peripheral neuropathy   . Pneumonia   . SCL ca Dx'd 12/2018  . Stroke ([facility]) 02/2015  . Wears glasses     ALLERGIES:  has No Known Allergies.  MEDICATIONS:  Current Outpatient Medications  Medication Sig Dispense Refill  . amLODipine (NORVASC) 10 MG tablet Take 1 tablet (10 mg total) by  mouth daily. 90 tablet 0  . aspirin 325 MG EC tablet Take 1 tablet (325 mg total) by mouth daily. 30 tablet 0  . aspirin EC 81 MG tablet Take 81 mg by mouth daily.    Marland Kitchen atorvastatin (LIPITOR) 40 MG tablet TAKE ONE TABLET BY MOUTH DAILY AT 6 PM (Patient taking differently: Take 40 mg by mouth daily at 6 PM. ) 90 tablet 0  . citalopram (CELEXA) 10 MG tablet Take 1 tablet (10 mg total) by mouth daily. 90 tablet 1  . clonazePAM (KLONOPIN) 0.5 MG tablet TAKE ONE TABLET BY MOUTH THREE TIMES A DAY 90 tablet 0  . gabapentin (NEURONTIN) 300 MG capsule Take 1 capsule (300 mg total) by mouth 3 (three) times daily. 270 capsule 1  . glimepiride (AMARYL) 4 MG tablet Take 1 tablet (4 mg total) by mouth daily with breakfast. 90 tablet 0  . glucose blood (ONETOUCH VERIO) test strip USE TO TEST BLOOD GLUCOSE 3 TIMES DAILY 100 each 12  . Insulin Glargine (LANTUS SOLOSTAR) 100 UNIT/ML Solostar Pen Inject 25 Units into the skin at bedtime. (Patient taking differently: Inject 40 Units into the skin at bedtime. ) 15 mL 6  . Insulin Pen Needle (B-D UF III MINI PEN NEEDLES) 31G X 5 MM MISC Use as directed. 100 each 3  . latanoprost (XALATAN) 0.005 % ophthalmic solution INSTILL 1 DROP INTO BOTH EYES EVERY EVENING (Patient taking differently: Place 1 drop into both eyes  at bedtime. ) 7.5 mL 0  . lisinopril (PRINIVIL,ZESTRIL) 40 MG tablet Take 1 tablet (40 mg total) by mouth daily. 90 tablet 1  . ondansetron (ZOFRAN ODT) 4 MG disintegrating tablet Take 1 tablet (4 mg total) by mouth every 8 (eight) hours as needed for nausea or vomiting. 6 tablet 0  . ONE TOUCH LANCETS MISC USE TO CHECK BLOOD SUGAR 3 TIMES DAILY 200 each 3  . prochlorperazine (COMPAZINE) 10 MG tablet TAKE ONE TABLET BY MOUTH EVERY 6 HOURS AS NEEDED FOR NAUSEA OR VOMITING 30 tablet 0  . traMADol (ULTRAM) 50 MG tablet Take 1 tablet (50 mg total) by mouth 3 (three) times daily for 30 days. 90 tablet 2   No current facility-administered medications for this  visit.     SURGICAL HISTORY:  Past Surgical History:  Procedure Laterality Date  . AMPUTATION Right 12/15/2013   Procedure: Right Transmetatarsal Amputation;  Surgeon: Newt Minion, MD;  Location: [facility];  Service: Orthopedics;  Laterality: Right;  . AMPUTATION Right 02/23/2014   Procedure: AMPUTATION FOOT;  Surgeon: Newt Minion, MD;  Location: [facility];  Service: Orthopedics;  Laterality: Right;  Right Foot Revision Transmetatarsal Amputation  . CATARACT EXTRACTION W/ INTRAOCULAR LENS  IMPLANT, BILATERAL    . COLONOSCOPY W/ BIOPSIES AND POLYPECTOMY    . I&D EXTREMITY Right 05/23/2013   Procedure: IRRIGATION AND DEBRIDEMENT RIGHT GREAT TOE;  Surgeon: Mauri Pole, MD;  Location: [facility];  Service: Orthopedics;  Laterality: Right;  . TUBAL LIGATION    . VIDEO BRONCHOSCOPY WITH ENDOBRONCHIAL ULTRASOUND N/A 12/22/2018   Procedure: VIDEO BRONCHOSCOPY WITH ENDOBRONCHIAL ULTRASOUND;  Surgeon: [name], DO;  Location: [facility];  Service: Thoracic;  Laterality: N/A;    REVIEW OF SYSTEMS:  A comprehensive review of systems was negative except for: Constitutional: positive for fatigue   PHYSICAL EXAMINATION: General appearance: alert, cooperative, fatigued and no distress Head: Normocephalic, without obvious abnormality, atraumatic Neck: no adenopathy, no JVD, supple, symmetrical, trachea midline and thyroid not enlarged, symmetric, no tenderness/mass/nodules Lymph nodes: Cervical, supraclavicular, and axillary nodes normal. Resp: clear to auscultation bilaterally Back: symmetric, no curvature. ROM normal. No CVA tenderness. Cardio: regular rate and rhythm, S1, S2 normal, no murmur, click, rub or gallop GI: soft, non-tender; bowel sounds normal; no masses,  no organomegaly Extremities: extremities normal, atraumatic, no cyanosis or edema  ECOG PERFORMANCE STATUS: 1 - Symptomatic but completely ambulatory  Blood pressure (!) 158/86, pulse 81, temperature 98.7 F (37.1 C), temperature source  Oral, resp. rate 18, height 5\' 5"  (1.651 m), weight 168 lb (76.2 kg), SpO2 97 %.  LABORATORY DATA: Lab Results  Component Value Date   WBC 12.4 (H) 05/02/2019   HGB 8.9 (L) 05/02/2019   HCT 28.4 (L) 05/02/2019   MCV 99.3 05/02/2019   PLT 316 05/02/2019      Chemistry      Component Value Date/Time   NA 137 05/02/2019 1249   K 4.6 05/02/2019 1249   CL 104 05/02/2019 1249   CO2 24 05/02/2019 1249   BUN 14 05/02/2019 1249   CREATININE 1.43 (H) 05/02/2019 1249      Component Value Date/Time   CALCIUM 8.5 (L) 05/02/2019 1249   ALKPHOS 142 (H) 05/02/2019 1249   AST 16 05/02/2019 1249   ALT 22 05/02/2019 1249   BILITOT <0.2 (L) 05/02/2019 1249       RADIOGRAPHIC STUDIES: No results found.  ASSESSMENT AND PLAN: This is a very pleasant 68 years old African-American female  with limited stage small cell lung cancer diagnosed in February 2020.  The patient is currently undergoing systemic chemotherapy with carboplatin and etoposide status post 5 cycles.  This is concurrent with radiotherapy.  She has been tolerating her systemic chemotherapy fairly well with no concerning adverse effects. I recommended for the patient to proceed with cycle #6 today as planned. I will see her back for follow-up visit in 3 weeks for evaluation with repeat CT scan of the chest for restaging of her disease. After completion of the chemotherapy, I will refer the patient back to radiation oncology for discussion of likely cranial radiation. The patient was advised to call immediately if she has any concerning symptoms in the interval. The patient voices understanding of current disease status and treatment options and is in agreement with the current care plan.  All questions were answered. The patient knows to call the clinic with any problems, questions or concerns. We can certainly see the patient much sooner if necessary.  I spent 10 minutes counseling the patient face to face. The total time spent in  the appointment was 15 minutes.  Disclaimer: This note was dictated with voice recognition software. Similar sounding words can inadvertently be transcribed and may not be corrected upon review.

## 2019-05-03 ENCOUNTER — Telehealth: Payer: Self-pay | Admitting: Internal Medicine

## 2019-05-03 ENCOUNTER — Inpatient Hospital Stay: Payer: HMO

## 2019-05-03 ENCOUNTER — Other Ambulatory Visit: Payer: Self-pay

## 2019-05-03 VITALS — BP 163/78 | HR 77 | Temp 98.7°F | Resp 18

## 2019-05-03 DIAGNOSIS — C3411 Malignant neoplasm of upper lobe, right bronchus or lung: Secondary | ICD-10-CM

## 2019-05-03 DIAGNOSIS — Z5111 Encounter for antineoplastic chemotherapy: Secondary | ICD-10-CM | POA: Diagnosis not present

## 2019-05-03 MED ORDER — SODIUM CHLORIDE 0.9 % IV SOLN
Freq: Once | INTRAVENOUS | Status: AC
  Administered 2019-05-03: 09:00:00 via INTRAVENOUS
  Filled 2019-05-03: qty 250

## 2019-05-03 MED ORDER — DEXAMETHASONE SODIUM PHOSPHATE 10 MG/ML IJ SOLN
10.0000 mg | Freq: Once | INTRAMUSCULAR | Status: AC
  Administered 2019-05-03: 10 mg via INTRAVENOUS

## 2019-05-03 MED ORDER — SODIUM CHLORIDE 0.9 % IV SOLN
100.0000 mg/m2 | Freq: Once | INTRAVENOUS | Status: AC
  Administered 2019-05-03: 180 mg via INTRAVENOUS
  Filled 2019-05-03: qty 9

## 2019-05-03 MED ORDER — DEXAMETHASONE SODIUM PHOSPHATE 10 MG/ML IJ SOLN
INTRAMUSCULAR | Status: AC
  Filled 2019-05-03: qty 1

## 2019-05-03 NOTE — Telephone Encounter (Signed)
Faxed and received confirmation the transmission was successful.

## 2019-05-03 NOTE — Telephone Encounter (Signed)
Scheduled appt per 6/23 los - pt to get an updated schedule next visit.

## 2019-05-03 NOTE — Patient Instructions (Signed)
[facility] Discharge Instructions for Patients Receiving Chemotherapy  Today you received the following chemotherapy agents:  Etoposide.  To help prevent nausea and vomiting after your treatment, we encourage you to take your nausea medication as directed.   If you develop nausea and vomiting that is not controlled by your nausea medication, call the clinic.   BELOW ARE SYMPTOMS THAT SHOULD BE REPORTED IMMEDIATELY:  *FEVER GREATER THAN 100.5 F  *CHILLS WITH OR WITHOUT FEVER  NAUSEA AND VOMITING THAT IS NOT CONTROLLED WITH YOUR NAUSEA MEDICATION  *UNUSUAL SHORTNESS OF BREATH  *UNUSUAL BRUISING OR BLEEDING  TENDERNESS IN MOUTH AND THROAT WITH OR WITHOUT PRESENCE OF ULCERS  *URINARY PROBLEMS  *BOWEL PROBLEMS  UNUSUAL RASH Items with * indicate a potential emergency and should be followed up as soon as possible.  Feel free to call the clinic should you have any questions or concerns. The clinic phone number is [phone].  Please show the [facility] at check-in to the Emergency Department and triage nurse.

## 2019-05-04 ENCOUNTER — Other Ambulatory Visit: Payer: Self-pay

## 2019-05-04 ENCOUNTER — Inpatient Hospital Stay: Payer: HMO

## 2019-05-04 VITALS — BP 185/85 | HR 66 | Temp 98.7°F | Resp 17

## 2019-05-04 DIAGNOSIS — Z5111 Encounter for antineoplastic chemotherapy: Secondary | ICD-10-CM | POA: Diagnosis not present

## 2019-05-04 DIAGNOSIS — C3411 Malignant neoplasm of upper lobe, right bronchus or lung: Secondary | ICD-10-CM

## 2019-05-04 MED ORDER — PEGFILGRASTIM 6 MG/0.6ML ~~LOC~~ PSKT
6.0000 mg | PREFILLED_SYRINGE | Freq: Once | SUBCUTANEOUS | Status: AC
  Administered 2019-05-04: 15:00:00 6 mg via SUBCUTANEOUS

## 2019-05-04 MED ORDER — SODIUM CHLORIDE 0.9 % IV SOLN
Freq: Once | INTRAVENOUS | Status: AC
  Administered 2019-05-04: 13:00:00 via INTRAVENOUS
  Filled 2019-05-04: qty 5

## 2019-05-04 MED ORDER — SODIUM CHLORIDE 0.9 % IV SOLN
Freq: Once | INTRAVENOUS | Status: AC
  Administered 2019-05-04: 12:00:00 via INTRAVENOUS
  Filled 2019-05-04: qty 250

## 2019-05-04 MED ORDER — PEGFILGRASTIM 6 MG/0.6ML ~~LOC~~ PSKT
PREFILLED_SYRINGE | SUBCUTANEOUS | Status: AC
  Filled 2019-05-04: qty 0.6

## 2019-05-04 MED ORDER — PALONOSETRON HCL INJECTION 0.25 MG/5ML
INTRAVENOUS | Status: AC
  Filled 2019-05-04: qty 5

## 2019-05-04 MED ORDER — PALONOSETRON HCL INJECTION 0.25 MG/5ML
0.2500 mg | Freq: Once | INTRAVENOUS | Status: AC
  Administered 2019-05-04: 0.25 mg via INTRAVENOUS

## 2019-05-04 MED ORDER — SODIUM CHLORIDE 0.9 % IV SOLN
100.0000 mg/m2 | Freq: Once | INTRAVENOUS | Status: AC
  Administered 2019-05-04: 180 mg via INTRAVENOUS
  Filled 2019-05-04: qty 9

## 2019-05-04 NOTE — Patient Instructions (Signed)
[facility] Discharge Instructions for Patients Receiving Chemotherapy  Today you received the following chemotherapy agents:  Etoposide.  To help prevent nausea and vomiting after your treatment, we encourage you to take your nausea medication as directed.   If you develop nausea and vomiting that is not controlled by your nausea medication, call the clinic.   BELOW ARE SYMPTOMS THAT SHOULD BE REPORTED IMMEDIATELY:  *FEVER GREATER THAN 100.5 F  *CHILLS WITH OR WITHOUT FEVER  NAUSEA AND VOMITING THAT IS NOT CONTROLLED WITH YOUR NAUSEA MEDICATION  *UNUSUAL SHORTNESS OF BREATH  *UNUSUAL BRUISING OR BLEEDING  TENDERNESS IN MOUTH AND THROAT WITH OR WITHOUT PRESENCE OF ULCERS  *URINARY PROBLEMS  *BOWEL PROBLEMS  UNUSUAL RASH Items with * indicate a potential emergency and should be followed up as soon as possible.  Feel free to call the clinic should you have any questions or concerns. The clinic phone number is [phone].  Please show the [facility] at check-in to the Emergency Department and triage nurse.

## 2019-05-06 ENCOUNTER — Ambulatory Visit: Payer: HMO

## 2019-05-08 ENCOUNTER — Other Ambulatory Visit: Payer: Self-pay | Admitting: *Deleted

## 2019-05-08 DIAGNOSIS — C3411 Malignant neoplasm of upper lobe, right bronchus or lung: Secondary | ICD-10-CM

## 2019-05-08 NOTE — Telephone Encounter (Signed)
Pt called to speak with [facility] regarding medication refills. Did not give agent specific ones in order to follow up on them.

## 2019-05-09 ENCOUNTER — Inpatient Hospital Stay: Payer: HMO

## 2019-05-09 NOTE — Telephone Encounter (Signed)
Primary Information  Source Subject Topic  Tara Hopkins (Patient) Tara Hopkins (Patient) General - Other  Summary: Patient is returning a call to the assistant  Reason for CRM: Patient stated that she received a call from the doctor's assistant. She is call back. Please return the call to [phone]

## 2019-05-09 NOTE — Telephone Encounter (Signed)
Left a message for a return call.

## 2019-05-10 ENCOUNTER — Telehealth: Payer: Self-pay | Admitting: Adult Health

## 2019-05-10 MED ORDER — LISINOPRIL 40 MG PO TABS: 40.0000 mg | ORAL_TABLET | Freq: Every day | ORAL | 0 refills | Status: DC

## 2019-05-10 MED ORDER — GABAPENTIN 300 MG PO CAPS: 300.0000 mg | ORAL_CAPSULE | Freq: Three times a day (TID) | ORAL | 0 refills | Status: DC

## 2019-05-10 MED ORDER — GLIMEPIRIDE 4 MG PO TABS: 4.0000 mg | ORAL_TABLET | Freq: Every day | ORAL | 0 refills | Status: DC

## 2019-05-10 MED ORDER — LANTUS SOLOSTAR 100 UNIT/ML ~~LOC~~ SOPN: 40.0000 [IU] | PEN_INJECTOR | Freq: Every day | SUBCUTANEOUS | 0 refills | Status: DC

## 2019-05-10 MED ORDER — LATANOPROST 0.005 % OP SOLN: OPHTHALMIC | 0 refills | Status: AC

## 2019-05-10 MED ORDER — CITALOPRAM HYDROBROMIDE 10 MG PO TABS: 10.0000 mg | ORAL_TABLET | Freq: Every day | ORAL | 0 refills | Status: DC

## 2019-05-10 MED ORDER — ATORVASTATIN CALCIUM 40 MG PO TABS: 40.0000 mg | ORAL_TABLET | Freq: Every day | ORAL | 0 refills | Status: DC

## 2019-05-10 MED ORDER — AMLODIPINE BESYLATE 10 MG PO TABS: 10.0000 mg | ORAL_TABLET | Freq: Every day | ORAL | 0 refills | Status: DC

## 2019-05-10 MED ORDER — ASPIRIN 325 MG PO TBEC: 325.0000 mg | DELAYED_RELEASE_TABLET | Freq: Every day | ORAL | 0 refills | Status: AC

## 2019-05-10 NOTE — Addendum Note (Signed)
Addended by: Miles Costain T on: 05/10/2019 11:59 AM   Modules accepted: Orders

## 2019-05-10 NOTE — Telephone Encounter (Signed)
Pt is transferring pharmacies.  Can you send in controls?  How long to fill others?

## 2019-05-10 NOTE — Telephone Encounter (Signed)
Source Subject Topic  Tara Hopkins (Patient) Tara Hopkins (Patient) General - Other  Summary: Patient is returning a call to the assistant  Reason for CRM: Patient stated that she received a call from the doctor's assistant. She is call back. Please return the call to [phone]

## 2019-05-10 NOTE — Addendum Note (Signed)
Addended by: Miles Costain T on: 05/10/2019 12:17 PM   Modules accepted: Orders

## 2019-05-10 NOTE — Telephone Encounter (Signed)
Fax # [phone]  Attn: [place]  Per pt please fax to

## 2019-05-10 NOTE — Telephone Encounter (Signed)
Sent to the pharmacy by e-scribe.  Nothing further needed. 

## 2019-05-10 NOTE — Telephone Encounter (Signed)
Ok to fill for 90 days   She is not due for any controlled medications until July 5th

## 2019-05-10 NOTE — Telephone Encounter (Signed)
Patient states she has not heard anything about her home health nurse.  She states that [facility] doesn't have anything on her when she called.

## 2019-05-11 NOTE — Telephone Encounter (Signed)
Cory notified this will be handled next week and agreed to plan.

## 2019-05-11 NOTE — Telephone Encounter (Signed)
[facility] for home health though [facility]

## 2019-05-16 ENCOUNTER — Inpatient Hospital Stay: Payer: HMO | Attending: Internal Medicine

## 2019-05-16 ENCOUNTER — Other Ambulatory Visit: Payer: Self-pay

## 2019-05-16 DIAGNOSIS — C3412 Malignant neoplasm of upper lobe, left bronchus or lung: Secondary | ICD-10-CM | POA: Diagnosis not present

## 2019-05-16 DIAGNOSIS — Z9221 Personal history of antineoplastic chemotherapy: Secondary | ICD-10-CM | POA: Diagnosis not present

## 2019-05-16 DIAGNOSIS — C3411 Malignant neoplasm of upper lobe, right bronchus or lung: Secondary | ICD-10-CM

## 2019-05-16 DIAGNOSIS — Z79899 Other long term (current) drug therapy: Secondary | ICD-10-CM | POA: Diagnosis not present

## 2019-05-16 DIAGNOSIS — I10 Essential (primary) hypertension: Secondary | ICD-10-CM | POA: Diagnosis not present

## 2019-05-16 DIAGNOSIS — Z923 Personal history of irradiation: Secondary | ICD-10-CM | POA: Insufficient documentation

## 2019-05-16 LAB — CBC WITH DIFFERENTIAL/PLATELET
Abs Immature Granulocytes: 0.01 10*3/uL (ref 0.00–0.07)
Basophils Absolute: 0 10*3/uL (ref 0.0–0.1)
Basophils Relative: 1 %
Eosinophils Absolute: 0.1 10*3/uL (ref 0.0–0.5)
Eosinophils Relative: 2 %
HCT: 27.2 % — ABNORMAL LOW (ref 36.0–46.0)
Hemoglobin: 8.3 g/dL — ABNORMAL LOW (ref 12.0–15.0)
Immature Granulocytes: 0 %
Lymphocytes Relative: 56 %
Lymphs Abs: 1.5 10*3/uL (ref 0.7–4.0)
MCH: 31.7 pg (ref 26.0–34.0)
MCHC: 30.5 g/dL (ref 30.0–36.0)
MCV: 103.8 fL — ABNORMAL HIGH (ref 80.0–100.0)
Monocytes Absolute: 0.2 10*3/uL (ref 0.1–1.0)
Monocytes Relative: 6 %
Neutro Abs: 0.9 10*3/uL — ABNORMAL LOW (ref 1.7–7.7)
Neutrophils Relative %: 35 %
Platelets: 121 10*3/uL — ABNORMAL LOW (ref 150–400)
RBC: 2.62 MIL/uL — ABNORMAL LOW (ref 3.87–5.11)
RDW: 18.4 % — ABNORMAL HIGH (ref 11.5–15.5)
WBC: 2.7 10*3/uL — ABNORMAL LOW (ref 4.0–10.5)
nRBC: 0 % (ref 0.0–0.2)

## 2019-05-16 LAB — CMP (CANCER CENTER ONLY)
ALT: 15 U/L (ref 0–44)
AST: 14 U/L — ABNORMAL LOW (ref 15–41)
Albumin: 3.4 g/dL — ABNORMAL LOW (ref 3.5–5.0)
Alkaline Phosphatase: 116 U/L (ref 38–126)
Anion gap: 11 (ref 5–15)
BUN: 16 mg/dL (ref 8–23)
CO2: 18 mmol/L — ABNORMAL LOW (ref 22–32)
Calcium: 8.4 mg/dL — ABNORMAL LOW (ref 8.9–10.3)
Chloride: 110 mmol/L (ref 98–111)
Creatinine: 1.4 mg/dL — ABNORMAL HIGH (ref 0.44–1.00)
GFR, Est AFR Am: 45 mL/min — ABNORMAL LOW (ref >60)
GFR, Estimated: 39 mL/min — ABNORMAL LOW (ref >60)
Glucose, Bld: 222 mg/dL — ABNORMAL HIGH (ref 70–99)
Potassium: 4.4 mmol/L (ref 3.5–5.1)
Sodium: 139 mmol/L (ref 135–145)
Total Bilirubin: <0.2 mg/dL — ABNORMAL LOW (ref 0.3–1.2)
Total Protein: 6.5 g/dL (ref 6.5–8.1)

## 2019-05-17 ENCOUNTER — Encounter: Payer: Self-pay | Admitting: Family Medicine

## 2019-05-18 ENCOUNTER — Ambulatory Visit: Payer: HMO | Attending: Adult Health | Admitting: Physical Therapy

## 2019-05-18 ENCOUNTER — Other Ambulatory Visit: Payer: Self-pay

## 2019-05-18 ENCOUNTER — Encounter: Payer: Self-pay | Admitting: Physical Therapy

## 2019-05-18 DIAGNOSIS — M6281 Muscle weakness (generalized): Secondary | ICD-10-CM | POA: Diagnosis not present

## 2019-05-18 DIAGNOSIS — R2689 Other abnormalities of gait and mobility: Secondary | ICD-10-CM

## 2019-05-18 DIAGNOSIS — R296 Repeated falls: Secondary | ICD-10-CM | POA: Diagnosis not present

## 2019-05-18 NOTE — Therapy (Signed)
[facility] [address] [place], [place], [address] Phone: [phone]   Fax:  [phone]  Physical Therapy Evaluation  Patient Details  Name: Tara Hopkins MRN: [id number] Date of Birth: 67-Dec-1952 Referring Provider (PT): [name], NP   Encounter Date: 67/07/2019   Physical Therapy Telehealth Visit for Wheelchair Evaluation:  I connected with [name] today at 67:10 by Webex video conference and verified that I am speaking with the correct person using two identifiers.  I discussed the limitations, risks, security and privacy concerns of performing an evaluation and management service by Webex and the availability of in person appointments.  I also discussed with the patient that there may be a patient responsible charge related to this service. The patient expressed understanding and agreed to proceed.    The patient's address was confirmed.  Identified to the patient that therapist is a licensed Physical Therapist in the state of [place].  Verified phone # as [phone] to call in case of technical difficulties.   PT End of Session - 05/18/19 1433    Visit Number  1    Number of Visits  1    Authorization Type  [employer]    PT Start Time  1310    PT Stop Time  1420    PT Time Calculation (min)  70 min    Activity Tolerance  Patient tolerated treatment well    Behavior During Therapy  WFL for tasks assessed/performed       Past Medical History:  Diagnosis Date  . Anemia   . Anxiety   . Bipolar 1 disorder ([place])   . Cataracts, bilateral   . Depression   . Diabetes mellitus without complication ([place])   . Glaucoma   . History of blood transfusion   . History of hyperbaric oxygen therapy    pt reports 67 treatments.  . Hypercholesteremia   . Hypertension   . Mediastinal adenopathy   . Peripheral neuropathy   . Pneumonia   . SCL ca Dx'd 12/2018  . Stroke ([place]) 02/2015  .  Wears glasses     Past Surgical History:  Procedure Laterality Date  . AMPUTATION Right 12/15/2013   Procedure: Right Transmetatarsal Amputation;  Surgeon: Newt Minion, MD;  Location: [facility];  Service: Orthopedics;  Laterality: Right;  . AMPUTATION Right 02/23/2014   Procedure: AMPUTATION FOOT;  Surgeon: Newt Minion, MD;  Location: [facility];  Service: Orthopedics;  Laterality: Right;  Right Foot Revision Transmetatarsal Amputation  . CATARACT EXTRACTION W/ INTRAOCULAR LENS  IMPLANT, BILATERAL    . COLONOSCOPY W/ BIOPSIES AND POLYPECTOMY    . I&D EXTREMITY Right 05/23/2013   Procedure: IRRIGATION AND DEBRIDEMENT RIGHT GREAT TOE;  Surgeon: Mauri Pole, MD;  Location: [facility];  Service: Orthopedics;  Laterality: Right;  . TUBAL LIGATION    . VIDEO BRONCHOSCOPY WITH ENDOBRONCHIAL ULTRASOUND N/A 12/22/2018   Procedure: VIDEO BRONCHOSCOPY WITH ENDOBRONCHIAL ULTRASOUND;  Surgeon: [name], DO;  Location: [facility];  Service: Thoracic;  Laterality: N/A;    There were no vitals filed for this visit.   Subjective Assessment - 05/18/19 1419    Subjective  Patient evaluated for a power wheelchair in 12/2017 after two strokes but was unable to pay co-pay.  Has recently been diagnosed with Lung CA and undergoing chemotherapy treatment with further deconditioning.  Physician has placed new referral for power w/c and Cancer center is willing to provide financial aide to assist with obtaining  power w/c.    Pertinent History  Anemia, Anxiety, Bipolar 1 disorder ([employer]), Cataracts, bilateral, Depression, Diabetes mellitus without complication ([facility]), Glaucoma, History of blood transfusion, History of hyperbaric oxygen therapy, Hypercholesteremia, Hypertension, Mediastinal adenopathy, Peripheral neuropathy, Pneumonia, h/o seizure, h/o falls    Patient Stated Goals  To obtain a power wheelchair    Currently in Pain?  No/denies         [facility] PT Assessment - 05/18/19 1426      Assessment   Medical Diagnosis   Lung Cancer    Referring Provider (PT)  [name], NP    Onset Date/Surgical Date  03/01/19    Hand Dominance  Right    Prior Therapy  yes      Precautions   Precautions  Other (comment);Fall    Precaution Comments  Anemia, Anxiety, Bipolar 1 disorder ([place]), Cataracts, bilateral, Depression, Diabetes mellitus without complication ([place]), Glaucoma, History of blood transfusion, History of hyperbaric oxygen therapy, Hypercholesteremia, Hypertension, Mediastinal adenopathy, Peripheral neuropathy, Pneumonia, h/o seizure, h/o falls      Balance Screen   Has the patient fallen in the past 6 months  Yes    How many times?  6    Has the patient had a decrease in activity level because of a fear of falling?   Yes      Prior Function   Level of Independence  Independent with household mobility with device;Requires assistive device for independence;Needs assistance with ADLs;Needs assistance with homemaking;Needs assistance with transfers;Needs assistance with gait      Ambulation/Gait   Ambulation/Gait  Yes    Ambulation/Gait Assistance  4: Min assist    Ambulation Distance (Feet)  25 Feet    Assistive device  Rolling walker    Gait Pattern  Step-to pattern;Decreased step length - right;Decreased step length - left;Decreased stride length;Decreased hip/knee flexion - right;Decreased hip/knee flexion - left;Decreased dorsiflexion - right;Decreased dorsiflexion - left;Shuffle;Trunk flexed;Poor foot clearance - left;Poor foot clearance - right    Ambulation Surface  Level;Indoor      Standardized Balance Assessment   Standardized Balance Assessment  Timed Up and Go Test      Timed Up and Go Test   TUG  Normal TUG    Normal TUG (seconds)  70    TUG Comments  with RW and min A of [employer]; required assistance at end to prevent fall when turning to sit in the wheelchair          Mobility/Seating Evaluation    PATIENT INFORMATION: Name: Tara Hopkins DOB: [birth year]  Sex: Female Date seen:  05/18/19 Time: 1300 via Telehealth Webex  Address:  [address], [place], [place] [address] Physician: Carmelia Roller, NP This evaluation/justification form will serve as the LMN for the following suppliers: __________________________ Supplier: [employer] Contact Person: Luz Brazen, ATP Phone:  [phone]   Seating Therapist: Misty Stanley, PT Phone:   [phone]   Phone: [phone]    Spouse/Parent/Caregiver name: Jake Seats  Phone number: [phone] Insurance/Payer: [employer]     Reason for Referral: To be evaluated for a wheelchair due to significant deconditioning  Patient/Caregiver Goals: To maintain independent mobility and prevent falls  Patient was seen for face-to-face evaluation for new power wheelchair.  Also present was [employer], [employer] to discuss recommendations and wheelchair options.  Further paperwork was completed and sent to vendor.  Patient appears to qualify for power mobility device at this time per objective findings.   MEDICAL HISTORY: Diagnosis:  Primary Diagnosis: Small cell lung cancer who presented with a right upper lobe lung nodule in addition to mediastinal lymphadenopathy; undergoing chemotherapy Onset: 12/2018 Diagnosis: CVA in 02/2015 and 08/2017   [] Progressive Disease Relevant past and future surgeries: R transmetatarsal amputation with revision in 2015    Height: 5'5" Weight: 163 lb Explain recent changes or trends in weight: significant weight loss when diagnosed with CA; continues to lose weight with chemotherapy   History including Falls: Anemia, Anxiety, Bipolar 1 disorder ([place]), Cataracts, bilateral, Depression, Diabetes mellitus without complication ([facility]), Glaucoma, History of blood transfusion, History of hyperbaric oxygen therapy, Hypercholesteremia, Hypertension, Mediastinal adenopathy, Peripheral neuropathy, Pneumonia, h/o seizure, h/o multiple falls including 6 within the past 6 months -  requires assistance of friend or family to get up off of the floor    HOME ENVIRONMENT: [] House  [] Condo/town home  [x] Apartment  [] Assisted Living    [x] Lives Alone []  Lives with Others                                                                                          Hours with caregiver: 6-8 hours  [x] Home is accessible to patient           Stairs      [] Yes [x]  No     Ramp [] Yes [x] No Comments:  Apartment is handicap accessible with level entry and 36" doorways   COMMUNITY ADL: TRANSPORTATION: [] Car    [] Van    [x] Public Transportation    [] Adapted w/c Lift    [] Ambulance    [] Other:       [x] Sits in wheelchair during transport  Employment/School: ????? Specific requirements pertaining to mobility ?????  Other: Patient has been receiving chemotherapy; goes out 2x/week for medical appointments    FUNCTIONAL/SENSORY PROCESSING SKILLS:  Handedness:   [x] Right     [] Left    [] NA  Comments:  but RUE is weaker than L; would like joystick on L side  Functional Processing Skills for Wheeled Mobility [x] Processing Skills are adequate for safe wheelchair operation  Areas of concern than may interfere with safe operation of wheelchair Description of problem   []  Attention to environment      [] Judgment      []  Hearing  []  Vision or visual processing      [] Motor Planning  []  Fluctuations in Behavior  ?????    VERBAL COMMUNICATION: [x] WFL receptive [x]  WFL expressive [] Understandable  [] Difficult to understand  [] non-communicative []  Uses an augmented communication device  CURRENT SEATING / MOBILITY: Current Mobility Base:  [] None [] Dependent [x] Manual [] Scooter [] Power  Type of Control: ?????  Manufacturer:  Camera operator III Size:  18 x 66 Age: >81 years old  Current Condition of Mobility Base:  Poor; no cushion, no longer has arm rest pads, front casters are worn and chunks missing from caster wheel,  rusted frame   Current Wheelchair components:  basic sling seat and back, leg  rests which patient does not use  Describe posture in present seating system:  seat back too high, posterior pelvic tilt with increased kyphosis and sacral sitting, rounded shoulders, leaning to R side, left pelvis elevated and  anteriorly rotated to right       SENSATION and SKIN ISSUES: Sensation [] Intact  [x] Impaired [] Absent  Level of sensation: reports numbness and tingling R hand and bilat LE Pressure Relief: Able to perform effective pressure relief :    [x] Yes  []  No Method: sit > stand with assistance If not, Why?: ?????  Skin Issues/Skin Integrity Current Skin Issues  [] Yes [x] No [x] Intact []  Red area[]  Open Area  [] Scar Tissue [] At risk from prolonged sitting Where  ?????  History of Skin Issues  [] Yes [x] No Where  ????? When  ?????  Hx of skin flap surgeries  [] Yes [x] No Where  ????? When  ?????  Limited sitting tolerance [] Yes [x] No Hours spent sitting in wheelchair daily: 8+  Complaint of Pain:  Please describe: None   Swelling/Edema: yes, pitting edema in bilat LE; R > L.  Dependent edema.  Pt elevates bilat LE during the day to help manage edema   ADL STATUS (in reference to wheelchair use):  Indep Assist Unable Indep with Equip Not assessed Comments  Dressing ????? ????? ????? X ????? Seated in wheelchair  Eating X ????? ????? ????? ????? seated in wheelchair  Toileting ????? ????? ????? X ????? transfers from wheelchair, uses grab bars and elevated commode seat  Bathing ????? X ????? ????? ????? requires family assist and uses tub bench  Grooming/Hygiene ????? ????? ????? X ????? seated in wheelchair  Meal Prep ????? ????? X ????? ????? meals delivered from [employer]  IADLS ????? X ????? ????? ????? uses motorized shopping cart at store  Bowel Management: [x] Continent  [] Incontinent  [] Accidents Comments:  ?????  Bladder Management: [x] Continent  [] Incontinent  [] Accidents Comments:  ?????     WHEELCHAIR SKILLS: Manual w/c Propulsion: [] UE or LE  strength and endurance sufficient to participate in ADLs using manual wheelchair Arm : [] left [] right   [x] Both      Distance: 25' Foot:  [] left [] right   [x] Both  Operate Scooter: []  Strength, hand grip, balance and transfer appropriate for use [] Living environment is accessible for use of scooter  Operate Power w/c:  [x]  Std. Joystick   []  Alternative Controls Indep [x]  Assist []  Dependent/unable []  N/A []   [x] Safe          [x]  Functional      Distance: 150'  Bed confined without wheelchair [x]  Yes []  No   STRENGTH/RANGE OF MOTION:  Active Range of Motion Strength  Shoulder Pt demonstrates greater than 100 deg of flexion and ABD but less than 120 deg of flexion/ABD; extension WFL 3+/5 bilateral  Elbow WFL RUE 3+/5, LUE 4/5  Wrist/Hand WFL RUE 3+/5, LUE 4/5  Increased weakness in RUE due to neuropathy and sequelae of CVA  Hip WFL 3/5 bilateral flexion  Knee WFL L knee extension and flexion: 4-/5; R knee extension and flexion: 4/5  Ankle Limited ankle DF and PF range due to edema  2/5 bilateral due to edema and neuropathy     MOBILITY/BALANCE:  []  Patient is totally dependent for mobility  ?????    Balance Transfers Ambulation  Sitting Balance: Standing Balance: []  Independent []  Independent/Modified Independent  [x]  WFL     []  WFL []  Supervision []  Supervision  []  Uses UE for balance  []  Supervision [x]  Min Assist [x]  Ambulates with Assist  Min - Mod A    []  Min Assist [x]  Min assist []  Mod Assist [x]  Ambulates with Device:      [x]  RW  []  StW  []   Cane  []  ?????  []  Mod Assist []  Mod assist []  Max assist   []  Max Assist []  Max assist []  Dependent []  Indep. Short Distance Only  []  Unable []  Unable []  Lift / Sling Required Distance (in feet)  25'   []  Sliding board []  Unable to Ambulate (see explanation below)  Cardio Status:  [x] Intact  []  Impaired   []  NA     ?????  Respiratory Status:  [] Intact   [x] Impaired   [] NA     due to Lung cancer  Orthotics/Prosthetics: None   Comments (Address manual vs power w/c vs scooter): The patient currently requires assistance to safely stand, transfer and ambulate and when she does not have assistance she experiences falls.  Patient's Timed Up and Go time was 1 minute 10 seconds with rolling walker and min-mod A to perform safely.  TUG times > 13.5 seconds indicate increased falls risk and times > 30 seconds indicate ADL dysfunction.  Pt is not a safe, functional ambulator due to requiring min-mod A to perform safely very short household distances.  Pt also demonstrates significant gait abnormalities including flexed trunk and shuffling gait placing pt at increased risk for falling.  Pt is unable to functionally propel a manual wheelchair due to pulmonary dysfunction, decreased activity tolerance/endurance, impaired UE and grip strength, ROM and sensation.  Also due to poor posture and positioning in the manual wheelchair pt is at risk for shoulder injury when manually propeling.  Pt is unable to safely transfer on and off of scooter platform due to impaired balance and decreased LE strength; pt also lacks sufficient UE strength to propel an electric scooter.  Patient requires a power wheelchair for independence and safety with ADL's in her home environment and to decrease her risk for falls with transfers and mobility.           Anterior / Posterior Obliquity Rotation-Pelvis Pt sits with R lateral lean  PELVIS    []  [x]  []   Neutral Posterior Anterior  []  []  [x]   WFL Rt elev Lt elev  []  [x]  []   WFL Right Left                      Anterior    Anterior     []  Fixed []  Other []  Partly Flexible [x]  Flexible   []  Fixed []  Other []  Partly Flexible  [x]  Flexible  []  Fixed []  Other []  Partly Flexible  [x]  Flexible   TRUNK  []  [x]  []   WFL ? Thoracic ? Lumbar  Kyphosis Lordosis  []  []  [x]   WFL Convex Convex  Right Left [x] c-curve [] s-curve [] multiple  [x]  Neutral []  Left-anterior []  Right-anterior     []  Fixed [x]   Flexible []  Partly Flexible []  Other  []  Fixed [x]  Flexible []  Partly Flexible []  Other  []  Fixed             []  Flexible []  Partly Flexible []  Other    Position Windswept  ?????  HIPS          [x]            []               []    Neutral       Abduct        ADduct         [x]           []            []   Neutral Right           Left      []  Fixed []  Subluxed []  Partly Flexible []  Dislocated []  Flexible  []  Fixed []  Other []  Partly Flexible  []  Flexible                 Foot Positioning Knee Positioning  ?????    []  WFL  [] Lt [] Rt [x]  WFL  [x] Lt [x] Rt    KNEES ROM concerns: ROM concerns:    & Dorsi-Flexed [] Lt [] Rt ?????    FEET Plantar Flexed [x] Lt [x] Rt      Inversion                 [] Lt [] Rt      Eversion                 [] Lt [] Rt     HEAD [x]  Functional [x]  Good Head Control  ?????  & []  Flexed         []  Extended []  Adequate Head Control    NECK []  Rotated  Lt  []  Lat Flexed Lt []  Rotated  Rt []  Lat Flexed Rt []  Limited Head Control     []  Cervical Hyperextension []  Absent  Head Control     SHOULDERS ELBOWS WRIST& HAND ?????      Left     Right    Left     Right    Left     Right   U/E [x] Functional           [x] Functional WFL WFL [] Fisting             [] Fisting      [] elev   [] dep      [] elev   [] dep       [] pro -[] retract     [] pro  [] retract [] subluxed             [] subluxed           Goals for Wheelchair Mobility  [x]  Independence with mobility in the home with motor related ADLs (MRADLs)  []  Independence with MRADLs in the community []  Provide dependent mobility  []  Provide recline     [] Provide tilt   Goals for Seating system [x]  Optimize pressure distribution [x]  Provide support needed to facilitate function or safety []  Provide corrective forces to assist with maintaining or improving posture []  Accommodate client's posture:   current seated postures and positions are not flexible or will not tolerate corrective forces []  Client to be independent with  relieving pressure in the wheelchair [] Enhance physiological function such as breathing, swallowing, digestion  Simulation ideas/Equipment trials:????? State why other equipment was unsuccessful:?????   MOBILITY BASE RECOMMENDATIONS and JUSTIFICATION: MOBILITY COMPONENT JUSTIFICATION  Manufacturer: Theatre manager: [employer]   Size: Width 18 Seat Depth 20 [x] provide transport from point A to B      [x] promote Indep mobility  [x] is not a safe, functional ambulator [x] walker or cane inadequate [] non-standard width/depth necessary to accommodate anatomical measurement []  ?????  [] Manual Mobility Base [] non-functional ambulator    [] Scooter/POV  [] can safely operate  [] can safely transfer   [] has adequate trunk stability  [] cannot functionally propel manual w/c  [x] Power Mobility Base  [] non-ambulatory  [x] cannot functionally propel manual wheelchair  [x]  cannot functionally and safely operate scooter/POV [x] can safely operate and willing to  [] Stroller Base [] infant/child  [] unable to propel manual wheelchair [] allows for growth [] non-functional ambulator [] non-functional UE [] Indep mobility is not a goal at this  time  [] Tilt  [] Forward [] Backward [] Powered tilt  [] Manual tilt  [] change position against gravitational force on head and shoulders  [] change position for pressure relief/cannot weight shift [] transfers  [] management of tone [] rest periods [] control edema [] facilitate postural control  []  ?????  [] Recline  [] Power recline on power base [] Manual recline on manual base  [] accommodate femur to back angle  [] bring to full recline for ADL care  [] change position for pressure relief/cannot weight shift [] rest periods [] repositioning for transfers or clothing/diaper /catheter changes [] head positioning  [] Lighter weight required [] self- propulsion  [] lifting []  ?????  [] Heavy Duty required [] user weight greater than 250# [] extreme tone/ over active movement [] broken frame on  previous chair []  ?????  [x]  Back  []  Angle Adjustable []  Custom molded Captain's Back [] postural control [] control of tone/spasticity [] accommodation of range of motion [] UE functional control [] accommodation for seating system []  ????? [] provide lateral trunk support [] accommodate deformity [x] provide posterior trunk support [x] provide lumbar/sacral support [x] support trunk in midline [x] Pressure relief over spinal processes  [x]  Seat Cushion Captain's Seat [] impaired sensation  [] decubitus ulcers present [] history of pressure ulceration [] prevent pelvic extension [x] low maintenance  [] stabilize pelvis  [] accommodate obliquity [] accommodate multiple deformity [x] neutralize lower extremity position [x] increase pressure distribution []  ?????  []  Pelvic/thigh support  []  Lateral thigh guide []  Distal medial pad  []  Distal lateral pad []  pelvis in neutral [] accommodate pelvis []  position upper legs []  alignment []  accommodate ROM []  decr adduction [] accommodate tone [] removable for transfers [] decr abduction  []  Lateral trunk Supports []  Lt     []  Rt [] decrease lateral trunk leaning [] control tone [] contour for increased contact [] safety  [] accommodate asymmetry []  ?????  []  Mounting hardware  [] lateral trunk supports  [] back   [] seat [] headrest      []  thigh support [] fixed   [] swing away [] attach seat platform/cushion to w/c frame [] attach back cushion to w/c frame [] mount postural supports [] mount headrest  [] swing medial thigh support away [] swing lateral supports away for transfers  []  ?????    Armrests  [] fixed [x] adjustable height [] removable   [] swing away  [x] flip back   [] reclining [x] full length pads [] desk    [] pads tubular  [x] provide support with elbow at 90   [] provide support for w/c tray [x] change of height/angles for variable activities [] remove for transfers [x] allow to come closer to table top [x] remove for access to tables []  ?????   Hangers/ Leg rests  [] 60 [] 70 [] 90 [] elevating [] heavy duty  [] articulating [] fixed [] lift off [] swing away     [] power [] provide LE support  [] accommodate to hamstring tightness [] elevate legs during recline   [] provide change in position for Legs [] Maintain placement of feet on footplate [] durability [] enable transfers [] decrease edema [] Accommodate lower leg length []  ?????  Foot support Footplate    [] Lt  []  Rt  [x]  Center mount [x] flip up     [] depth/angle adjustable [] Amputee adapter    []  Lt     []  Rt [x] provide foot support [] accommodate to ankle ROM [x] transfers [] Provide support for residual extremity []  allow foot to go under wheelchair base []  decrease tone  []  ?????  []  Ankle strap/heel loops [] support foot on foot support [] decrease extraneous movement [] provide input to heel  [] protect foot  Tires: [] pneumatic  [x] flat free inserts  [] solid  [x] decrease maintenance  [x] prevent frequent flats [] increase shock absorbency [] decrease pain from road shock [] decrease spasms from road shock []  ?????  []  Headrest  [] provide posterior head support [] provide posterior neck support [] provide lateral head  support [] provide anterior head support [] support during tilt and recline [] improve feeding   [] improve respiration [] placement of switches [] safety  [] accommodate ROM  [] accommodate tone [] improve visual orientation  []  Anterior chest strap []  Vest []  Shoulder retractors  [] decrease forward movement of shoulder [] accommodation of TLSO [] decrease forward movement of trunk [] decrease shoulder elevation [] added abdominal support [] alignment [] assistance with shoulder control  []  ?????  Pelvic Positioner [x] Belt [] SubASIS bar [] Dual Pull [] stabilize tone [x] decrease falling out of chair/ **will not Decr potential for sliding due to pelvic tilting [] prevent excessive rotation [] pad for protection over boney prominence [] prominence comfort [] special pull  angle to control rotation []  ?????  Upper Extremity Support [] L   []  R [] Arm trough    [] hand support []  tray       [] full tray [] swivel mount [] decrease edema      [] decrease subluxation   [] control tone   [] placement for AAC/Computer/EADL [] decrease gravitational pull on shoulders [] provide midline positioning [] provide support to increase UE function [] provide hand support in natural position [] provide work surface   POWER WHEELCHAIR CONTROLS  [x] Proportional  [] Non-Proportional Type Joystick [x] Left  [] Right [x] provides access for controlling wheelchair   [] lacks motor control to operate proportional drive control [] unable to understand proportional controls  Actuator Control Module  [] Single  [] Multiple   [] Allow the client to operate the power seat function(s) through the joystick control   [] Safety Reset Switches [] Used to change modes and stop the wheelchair when driving in latch mode    [] Upgraded Electronics   [] programming for accurate control [] progressive Disease/changing condition [] non-proportional drive control needed [] Needed in order to operate power seat functions through joystick control   [] Display box [] Allows user to see in which mode and drive the wheelchair is set  [] necessary for alternate controls    [] Digital interface electronics [] Allows w/c to operate when using alternative drive controls  [] ASL Head Array [] Allows client to operate wheelchair  through switches placed in tri-panel headrest  [] Sip and puff with tubing kit [] needed to operate sip and puff drive controls  [] Upgraded tracking electronics [] increase safety when driving [] correct tracking when on uneven surfaces  [x] Mount for switches or joystick [x] Attaches switches to w/c  [x] Swing away for access or transfers [] midline for optimal placement [] provides for consistent access  [] Attendant controlled joystick plus mount [] safety [] long distance driving [] operation of seat functions  [] compliance with transportation regulations []  ?????    Rear wheel placement/Axle adjustability [] None [] semi adjustable [] fully adjustable  [] improved UE access to wheels [] improved stability [] changing angle in space for improvement of postural stability [] 1-arm drive access [] amputee pad placement []  ?????  Wheel rims/ hand rims  [] metal  [] plastic coated [] oblique projections [] vertical projections [] Provide ability to propel manual wheelchair  []  Increase self-propulsion with hand weakness/decreased grasp  Push handles [] extended  [] angle adjustable  [] standard [] caregiver access [] caregiver assist [] allows "hooking" to enable increased ability to perform ADLs or maintain balance  One armed device  [] Lt   [] Rt [] enable propulsion of manual wheelchair with one arm   []  ?????   Brake/wheel lock extension []  Lt   []  Rt [] increase indep in applying wheel locks   [] Side guards [] prevent clothing getting caught in wheel or becoming soiled []  prevent skin tears/abrasions  Battery: U1's x 2 [x] to power wheelchair ?????  Other: ????? ????? ?????  The above equipment has a life- long use expectancy. Growth and changes in medical and/or functional conditions would be the exceptions. This is to certify that the  therapist has no financial relationship with durable medical provider or manufacturer. The therapist will not receive remuneration of any kind for the equipment recommended in this evaluation.   Patient has mobility limitation that significantly impairs safe, timely participation in one or more mobility related ADL's.  (bathing, toileting, feeding, dressing, grooming, moving from room to room)                                                             [x]  Yes []  No Will mobility device sufficiently improve ability to participate and/or be aided in participation of MRADL's?         [x]  Yes []  No Can limitation be compensated for with use of a cane or walker?                                                                                 []  Yes [x]  No Does patient or caregiver demonstrate ability/potential ability & willingness to safely use the mobility device?   [x]  Yes []  No Does patient's home environment support use of recommended mobility device?                                                    [x]  Yes []  No Does patient have sufficient upper extremity function necessary to functionally propel a manual wheelchair?    []  Yes [x]  No Does patient have sufficient strength and trunk stability to safely operate a POV (scooter)?                                  []  Yes [x]  No Does patient need additional features/benefits provided by a power wheelchair for MRADL's in the home?       [x]  Yes []  No Does the patient demonstrate the ability to safely use a power wheelchair?                                                              [x]  Yes []  No  Therapist Name Printed: Rico Junker, PT, DPT Date: 05/18/2019  Therapist's Signature:   Date:   Supplier's Name Printed: [employer], [employer] Date: 05/18/2019  Supplier's Signature:   Date:  Patient/Caregiver Signature:   Date:     This is to certify that I have read this evaluation and do agree with the content within:      Physician's Name Printed: [name], NP  Physician's Signature:  Date:     This is to certify that I, the above signed therapist have the following affiliations: []  This  DME provider []  Manufacturer of recommended equipment []  Patient's long term care facility [x]  None of the above            Objective measurements completed on examination: See above findings.              PT Education - 05/18/19 1433    Education Details  w/c recommendations    Person(s) Educated  Patient    Methods  Explanation    Comprehension  Verbalized understanding            Plan - 05/18/19 1434    Clinical Impression Statement  Pt is a 68 year old female referred to Neuro OPPT for evaluation of  power wheelchair due to lung cancer, history of R transmetatarsal amputation and h/o CVA x 2.  Pt's PMH is significant for the following: Anemia, Anxiety, Bipolar 1 disorder ([place]), Cataracts, bilateral, Depression, Diabetes mellitus without complication ([place]), Glaucoma, History of blood transfusion, History of hyperbaric oxygen therapy, Hypercholesteremia, Hypertension, Mediastinal adenopathy, Peripheral neuropathy, Pneumonia, h/o seizure, h/o falls. The following deficits were noted during pt's exam: impaired sensation in RUE and bilat LE, pitting edema in bilat LE, impaired UE and LE strength and AROM, impaired posture and postural control, impaired balance, decreased activity tolerance and endurance and abnormal gait.  Pt's TUG score indicates pt is at high risk for falls. Pt would benefit from power mobility to maximize functional mobility independence and reduce falls risk.    Personal Factors and Comorbidities  Age;Comorbidity 3+    Comorbidities  Lung CA, chemotherapy, CVA x 2, R transmetatarsal amputation, Anemia, Anxiety, Bipolar 1 disorder ([place]), Cataracts, bilateral, Depression, Diabetes mellitus without complication ([place]), Glaucoma, History of blood transfusion, History of hyperbaric oxygen therapy, Hypercholesteremia, Hypertension, Mediastinal adenopathy, Peripheral neuropathy, Pneumonia, h/o seizure, h/o falls    Examination-Activity Limitations  Bathing;Locomotion Level;Stand;Transfers    Examination-Participation Restrictions  Cleaning;Laundry;Shop    Stability/Clinical Decision Making  Evolving/Moderate complexity    Clinical Decision Making  Moderate    PT Frequency  One time visit    PT Duration  Other (comment)   wheelchair evaluation only   PT Treatment/Interventions  Other (comment)   wheelchair management   Consulted and Agree with Plan of Care  Patient       Patient will benefit from skilled therapeutic intervention in order to improve the following deficits and impairments:   Abnormal gait, Cardiopulmonary status limiting activity, Decreased activity tolerance, Decreased balance, Decreased endurance, Decreased mobility, Decreased range of motion, Decreased strength, Difficulty walking, Increased edema, Impaired sensation, Impaired UE functional use, Postural dysfunction  Visit Diagnosis: 1. Repeated falls   2. Muscle weakness (generalized)   3. Other abnormalities of gait and mobility        Problem List Patient Active Problem List   Diagnosis Date Noted  . Chemotherapy induced diarrhea 04/10/2019  . Encounter for antineoplastic chemotherapy 01/06/2019  . Goals of care, counseling/discussion 01/06/2019  . 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 Mediastinal adenopathy 12/22/2018  . Mass of right lung 11/10/2018  . Right hand pain 01/19/2018  . History of stroke 10/05/2017  . Former smoker 10/05/2017  . Substance abuse ([place]) 10/05/2017  . Cerebrovascular accident (CVA) due to thrombosis of right middle cerebral artery ([place]) 07/23/2017  . Subcortical infarction ([place])   . Hyperlipidemia 07/22/2017  . Mild tetrahydrocannabinol (THC) abuse   . Smoker   . CVA (cerebral vascular accident) ([place]) 07/21/2017  . Ischemic stroke ([place]) 07/21/2017  . Diabetes  mellitus with complication ([place]) 92/42/6834  . Slurred speech 07/21/2017  . Bipolar disorder ([place]) 09/09/2015  . Left hemiparesis ([place]) 02/20/2015  . Cocaine abuse ([place])   . Stroke ([place]) 02/15/2015  . Osteomyelitis of foot, right, acute ([place]) 02/23/2014  . Status post transmetatarsal amputation of right foot ([place]) 12/15/2013  . DM type 2 with diabetic peripheral neuropathy ([place]) 05/21/2013  . Essential hypertension 05/21/2013  . Depression 05/21/2013  . Anemia 05/21/2013  . Renal insufficiency 05/21/2013    Rico Junker, PT, DPT 05/18/19    2:53 PM    [facility] [address] [place], [place],  [address] Phone: [phone]   Fax:  [phone]  Name: Jericca Russett MRN: [id number] Date of Birth: 07-18-1951

## 2019-05-22 ENCOUNTER — Other Ambulatory Visit: Payer: Self-pay | Admitting: Medical Oncology

## 2019-05-22 DIAGNOSIS — C3411 Malignant neoplasm of upper lobe, right bronchus or lung: Secondary | ICD-10-CM

## 2019-05-23 ENCOUNTER — Other Ambulatory Visit: Payer: Self-pay

## 2019-05-23 ENCOUNTER — Inpatient Hospital Stay: Payer: HMO

## 2019-05-23 ENCOUNTER — Ambulatory Visit (HOSPITAL_COMMUNITY)
Admission: RE | Admit: 2019-05-23 | Discharge: 2019-05-23 | Disposition: A | Discharge status: Home / Self Care | Payer: HMO | Source: Ambulatory Visit | Attending: Internal Medicine | Admitting: Internal Medicine

## 2019-05-23 DIAGNOSIS — C3411 Malignant neoplasm of upper lobe, right bronchus or lung: Secondary | ICD-10-CM | POA: Insufficient documentation

## 2019-05-23 DIAGNOSIS — C3412 Malignant neoplasm of upper lobe, left bronchus or lung: Secondary | ICD-10-CM | POA: Diagnosis not present

## 2019-05-23 DIAGNOSIS — R590 Localized enlarged lymph nodes: Secondary | ICD-10-CM | POA: Diagnosis not present

## 2019-05-23 DIAGNOSIS — C3490 Malignant neoplasm of unspecified part of unspecified bronchus or lung: Secondary | ICD-10-CM | POA: Diagnosis not present

## 2019-05-23 LAB — CMP (CANCER CENTER ONLY)
ALT: 14 U/L (ref 0–44)
AST: 15 U/L (ref 15–41)
Albumin: 3.5 g/dL (ref 3.5–5.0)
Alkaline Phosphatase: 115 U/L (ref 38–126)
Anion gap: 11 (ref 5–15)
BUN: 15 mg/dL (ref 8–23)
CO2: 25 mmol/L (ref 22–32)
Calcium: 8.2 mg/dL — ABNORMAL LOW (ref 8.9–10.3)
Chloride: 104 mmol/L (ref 98–111)
Creatinine: 1.41 mg/dL — ABNORMAL HIGH (ref 0.44–1.00)
GFR, Est AFR Am: 45 mL/min — ABNORMAL LOW (ref >60)
GFR, Estimated: 39 mL/min — ABNORMAL LOW (ref >60)
Glucose, Bld: 295 mg/dL — ABNORMAL HIGH (ref 70–99)
Potassium: 4.1 mmol/L (ref 3.5–5.1)
Sodium: 140 mmol/L (ref 135–145)
Total Bilirubin: 0.2 mg/dL — ABNORMAL LOW (ref 0.3–1.2)
Total Protein: 6.6 g/dL (ref 6.5–8.1)

## 2019-05-23 LAB — CBC WITH DIFFERENTIAL (CANCER CENTER ONLY)
Abs Immature Granulocytes: 0.01 10*3/uL (ref 0.00–0.07)
Basophils Absolute: 0 10*3/uL (ref 0.0–0.1)
Basophils Relative: 0 %
Eosinophils Absolute: 0.1 10*3/uL (ref 0.0–0.5)
Eosinophils Relative: 3 %
HCT: 30.8 % — ABNORMAL LOW (ref 36.0–46.0)
Hemoglobin: 9.5 g/dL — ABNORMAL LOW (ref 12.0–15.0)
Immature Granulocytes: 0 %
Lymphocytes Relative: 35 %
Lymphs Abs: 1.2 10*3/uL (ref 0.7–4.0)
MCH: 31.7 pg (ref 26.0–34.0)
MCHC: 30.8 g/dL (ref 30.0–36.0)
MCV: 102.7 fL — ABNORMAL HIGH (ref 80.0–100.0)
Monocytes Absolute: 0.3 10*3/uL (ref 0.1–1.0)
Monocytes Relative: 9 %
Neutro Abs: 1.7 10*3/uL (ref 1.7–7.7)
Neutrophils Relative %: 53 %
Platelet Count: 282 10*3/uL (ref 150–400)
RBC: 3 MIL/uL — ABNORMAL LOW (ref 3.87–5.11)
RDW: 16.9 % — ABNORMAL HIGH (ref 11.5–15.5)
WBC Count: 3.3 10*3/uL — ABNORMAL LOW (ref 4.0–10.5)
nRBC: 0 % (ref 0.0–0.2)

## 2019-05-23 MED ORDER — IOHEXOL 300 MG/ML  SOLN
60.0000 mL | Freq: Once | INTRAMUSCULAR | Status: AC | PRN
  Administered 2019-05-23: 15:00:00 60 mL via INTRAVENOUS

## 2019-05-23 MED ORDER — SODIUM CHLORIDE (PF) 0.9 % IJ SOLN
INTRAMUSCULAR | Status: AC
  Filled 2019-05-23: qty 50

## 2019-05-25 ENCOUNTER — Inpatient Hospital Stay (HOSPITAL_BASED_OUTPATIENT_CLINIC_OR_DEPARTMENT_OTHER): Payer: HMO | Admitting: Internal Medicine

## 2019-05-25 ENCOUNTER — Encounter: Payer: Self-pay | Admitting: Internal Medicine

## 2019-05-25 ENCOUNTER — Other Ambulatory Visit: Payer: Self-pay

## 2019-05-25 VITALS — BP 173/87 | HR 66 | Temp 98.5°F | Resp 20 | Ht 65.0 in | Wt 164.2 lb

## 2019-05-25 DIAGNOSIS — I10 Essential (primary) hypertension: Secondary | ICD-10-CM | POA: Diagnosis not present

## 2019-05-25 DIAGNOSIS — C3411 Malignant neoplasm of upper lobe, right bronchus or lung: Secondary | ICD-10-CM

## 2019-05-25 DIAGNOSIS — Z79899 Other long term (current) drug therapy: Secondary | ICD-10-CM

## 2019-05-25 DIAGNOSIS — Z923 Personal history of irradiation: Secondary | ICD-10-CM | POA: Diagnosis not present

## 2019-05-25 DIAGNOSIS — Z9221 Personal history of antineoplastic chemotherapy: Secondary | ICD-10-CM

## 2019-05-25 DIAGNOSIS — C3490 Malignant neoplasm of unspecified part of unspecified bronchus or lung: Secondary | ICD-10-CM

## 2019-05-25 DIAGNOSIS — C3412 Malignant neoplasm of upper lobe, left bronchus or lung: Secondary | ICD-10-CM | POA: Diagnosis not present

## 2019-05-25 NOTE — Progress Notes (Signed)
[facility] [phone]   [phone]  OFFICE PROGRESS NOTE  Tara Peng, NP [place] [address]  DIAGNOSIS: Limited stage (T1c, N2, M0) small cell lung cancer presented with left upper lobe lung nodule in addition to mediastinal lymphadenopathy diagnosed in February 2020.  PRIOR THERAPY: Systemic chemotherapy with carboplatin for AUC of 5 on day 1 and etoposide at 100 mg/M2 on days 1, 2 and 3 with Neulasta support every 3 weeks. First dose January 16, 2019. Status post cycle6. Last dose was given May 02, 2019   CURRENT THERAPY:  Observation.  INTERVAL HISTORY: Tara Hopkins 68 y.o. female returns to the clinic today for follow-up visit.  The patient is feeling fine today with no concerning complaints.  She denied having any current chest pain, shortness of breath, cough or hemoptysis.  She has no nausea, vomiting, diarrhea or constipation.  She denied having any headache or visual changes.  The patient has no weight loss or night sweats.  She tolerated the previous course of systemic chemotherapy fairly well.  She was supposed to have concurrent radiotherapy but for some reason this was not done during her chemotherapy.  She had repeat CT scan of the chest performed recently and she is here for evaluation and discussion of her scan results.   MEDICAL HISTORY: Past Medical History:  Diagnosis Date  . Anemia   . Anxiety   . Bipolar 1 disorder ([place])   . Cataracts, bilateral   . Depression   . Diabetes mellitus without complication ([place])   . Glaucoma   . History of blood transfusion   . History of hyperbaric oxygen therapy    pt reports 80 treatments.  . Hypercholesteremia   . Hypertension   . Mediastinal adenopathy   . Peripheral neuropathy   . Pneumonia   . SCL ca Dx'd 12/2018  . Stroke ([place]) 02/2015  . Wears glasses     ALLERGIES:  has No Known Allergies.  MEDICATIONS:  Current Outpatient Medications   Medication Sig Dispense Refill  . amLODipine (NORVASC) 10 MG tablet Take 1 tablet (10 mg total) by mouth daily. 90 tablet 0  . aspirin 325 MG EC tablet Take 1 tablet (325 mg total) by mouth daily. 90 tablet 0  . aspirin EC 81 MG tablet Take 81 mg by mouth daily.    Marland Kitchen atorvastatin (LIPITOR) 40 MG tablet Take 1 tablet (40 mg total) by mouth daily. TAKE ONE TABLET BY MOUTH DAILY AT 6 PM 90 tablet 0  . citalopram (CELEXA) 10 MG tablet Take 1 tablet (10 mg total) by mouth daily. 90 tablet 0  . clonazePAM (KLONOPIN) 0.5 MG tablet TAKE ONE TABLET BY MOUTH THREE TIMES A DAY 90 tablet 0  . gabapentin (NEURONTIN) 300 MG capsule Take 1 capsule (300 mg total) by mouth 3 (three) times daily. 270 capsule 0  . glimepiride (AMARYL) 4 MG tablet Take 1 tablet (4 mg total) by mouth daily with breakfast. 90 tablet 0  . glucose blood (ONETOUCH VERIO) test strip USE TO TEST BLOOD GLUCOSE 3 TIMES DAILY 100 each 12  . Insulin Glargine (LANTUS SOLOSTAR) 100 UNIT/ML Solostar Pen Inject 40 Units into the skin at bedtime. 45 mL 0  . Insulin Pen Needle (B-D UF III MINI PEN NEEDLES) 31G X 5 MM MISC Use as directed. 100 each 3  . latanoprost (XALATAN) 0.005 % ophthalmic solution INSTILL 1 DROP INTO BOTH EYES EVERY EVENING 7.5 mL 0  .  lisinopril (ZESTRIL) 40 MG tablet Take 1 tablet (40 mg total) by mouth daily. 90 tablet 0  . ondansetron (ZOFRAN ODT) 4 MG disintegrating tablet Take 1 tablet (4 mg total) by mouth every 8 (eight) hours as needed for nausea or vomiting. 6 tablet 0  . ONE TOUCH LANCETS MISC USE TO CHECK BLOOD SUGAR 3 TIMES DAILY 200 each 3  . prochlorperazine (COMPAZINE) 10 MG tablet TAKE ONE TABLET BY MOUTH EVERY 6 HOURS AS NEEDED FOR NAUSEA OR VOMITING 30 tablet 0   No current facility-administered medications for this visit.     SURGICAL HISTORY:  Past Surgical History:  Procedure Laterality Date  . AMPUTATION Right 12/15/2013   Procedure: Right Transmetatarsal Amputation;  Surgeon: Newt Minion, MD;   Location: [facility];  Service: Orthopedics;  Laterality: Right;  . AMPUTATION Right 02/23/2014   Procedure: AMPUTATION FOOT;  Surgeon: Newt Minion, MD;  Location: [facility];  Service: Orthopedics;  Laterality: Right;  Right Foot Revision Transmetatarsal Amputation  . CATARACT EXTRACTION W/ INTRAOCULAR LENS  IMPLANT, BILATERAL    . COLONOSCOPY W/ BIOPSIES AND POLYPECTOMY    . I&D EXTREMITY Right 05/23/2013   Procedure: IRRIGATION AND DEBRIDEMENT RIGHT GREAT TOE;  Surgeon: Mauri Pole, MD;  Location: [facility];  Service: Orthopedics;  Laterality: Right;  . TUBAL LIGATION    . VIDEO BRONCHOSCOPY WITH ENDOBRONCHIAL ULTRASOUND N/A 12/22/2018   Procedure: VIDEO BRONCHOSCOPY WITH ENDOBRONCHIAL ULTRASOUND;  Surgeon: [name], DO;  Location: [facility];  Service: Thoracic;  Laterality: N/A;    REVIEW OF SYSTEMS:  Constitutional: negative Eyes: negative Ears, nose, mouth, throat, and face: negative Respiratory: negative Cardiovascular: negative Gastrointestinal: negative Genitourinary:negative Integument/breast: negative Hematologic/lymphatic: negative Musculoskeletal:positive for muscle weakness Neurological: negative Behavioral/Psych: negative Endocrine: negative Allergic/Immunologic: negative   PHYSICAL EXAMINATION: General appearance: alert, cooperative, fatigued and no distress Head: Normocephalic, without obvious abnormality, atraumatic Neck: no adenopathy, no JVD, supple, symmetrical, trachea midline and thyroid not enlarged, symmetric, no tenderness/mass/nodules Lymph nodes: Cervical, supraclavicular, and axillary nodes normal. Resp: clear to auscultation bilaterally Back: symmetric, no curvature. ROM normal. No CVA tenderness. Cardio: regular rate and rhythm, S1, S2 normal, no murmur, click, rub or gallop GI: soft, non-tender; bowel sounds normal; no masses,  no organomegaly Extremities: extremities normal, atraumatic, no cyanosis or edema Neurologic: Alert and oriented X 3, normal  strength and tone. Normal symmetric reflexes. Normal coordination and gait  ECOG PERFORMANCE STATUS: 1 - Symptomatic but completely ambulatory  Blood pressure (!) 173/87, pulse 66, temperature 98.5 F (36.9 C), temperature source Oral, resp. rate 20, height 5\' 5"  (1.651 m), weight 164 lb 3 oz (74.5 kg), SpO2 99 %.  LABORATORY DATA: Lab Results  Component Value Date   WBC 3.3 (L) 05/23/2019   HGB 9.5 (L) 05/23/2019   HCT 30.8 (L) 05/23/2019   MCV 102.7 (H) 05/23/2019   PLT 282 05/23/2019      Chemistry      Component Value Date/Time   NA 140 05/23/2019 1118   K 4.1 05/23/2019 1118   CL 104 05/23/2019 1118   CO2 25 05/23/2019 1118   BUN 15 05/23/2019 1118   CREATININE 1.41 (H) 05/23/2019 1118      Component Value Date/Time   CALCIUM 8.2 (L) 05/23/2019 1118   ALKPHOS 115 05/23/2019 1118   AST 15 05/23/2019 1118   ALT 14 05/23/2019 1118   BILITOT 0.2 (L) 05/23/2019 1118       RADIOGRAPHIC STUDIES: Ct Chest W Contrast  Addendum Date: 05/23/2019  ADDENDUM REPORT: 05/23/2019 17:55 ADDENDUM: Correction: The aberrant RIGHT subclavian artery described in the findings section is normal anatomy. Electronically Signed   By: Suzy Bouchard M.D.   On: 05/23/2019 17:55   Result Date: 05/23/2019 CLINICAL DATA:  Small cell lung cancer. Diagnosis February 2020. Chemotherapy radiation therapy complete. EXAM: CT CHEST WITH CONTRAST TECHNIQUE: Multidetector CT imaging of the chest was performed during intravenous contrast administration. CONTRAST:  32mL OMNIPAQUE IOHEXOL 300 MG/ML  SOLN COMPARISON:  CT 03/14/2019, PET-CT 12/01/2018 FINDINGS: Cardiovascular: No significant vascular findings. Normal heart size. No pericardial effusion. 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 Coronary artery calcification and aortic atherosclerotic calcification. Mediastinum/Nodes: Mediastinal lymph nodes stable to slightly decreased. For example RIGHT lower paratracheal lymph node measures  1.7 cm compared to 1.6 cm on prior. Subcarinal lymph node measures 1.9 cm compared to 2.0 cm. No new mediastinal adenopathy. No pericardial fluid. Lungs/Pleura: No suspicious pulmonary nodules. Resolution of previously described nodules. Small sub 5 mm LEFT upper lobe nodules unchanged (image 53/5). Upper Abdomen: Limited view of the liver, kidneys, pancreas are unremarkable. Normal adrenal glands. Musculoskeletal: Degenerative osteophytosis of the spine. IMPRESSION: 1. Stable to mildly decreased mediastinal adenopathy. 2. No suspicious pulmonary nodules. 3. No evidence disease progression. Electronically Signed: By: Suzy Bouchard M.D. On: 05/23/2019 17:47    ASSESSMENT AND PLAN: This is a very pleasant 68 years old African-American female with limited stage small cell lung cancer diagnosed in February 2020.  The patient underwent systemic chemotherapy with carboplatin and etoposide status post 6 cycles.  Last dose was given May 02, 2019.  The patient tolerated this course of treatment well with no concerning adverse effects.  She was supposed to have concurrent radiotherapy during her treatment but for some reason this was missed.  She was evaluated in the past by Dr. Sondra Come. She had repeat CT scan of the chest performed recently.  I personally and independently reviewed the scans and discussed the results with the patient today. Her scan showed a stable disease with no concerning findings for progression. I recommended for the patient to see Dr. Sondra Come for evaluation of the radiotherapy option as well as discussion of prophylactic cranial irradiation. I will see her back for follow-up visit in 3 months with repeat CT scan of the chest for restaging of her disease. For hypertension she was advised to take her blood pressure medication as prescribed and to monitor it closely at home. The patient was advised to call immediately if she has any concerning symptoms in the interval. The patient voices  understanding of current disease status and treatment options and is in agreement with the current care plan.  All questions were answered. The patient knows to call the clinic with any problems, questions or concerns. We can certainly see the patient much sooner if necessary.  Disclaimer: This note was dictated with voice recognition software. Similar sounding words can inadvertently be transcribed and may not be corrected upon review.

## 2019-05-27 ENCOUNTER — Other Ambulatory Visit: Payer: Self-pay | Admitting: Adult Health

## 2019-05-27 DIAGNOSIS — Z760 Encounter for issue of repeat prescription: Secondary | ICD-10-CM

## 2019-05-29 ENCOUNTER — Telehealth: Payer: Self-pay | Admitting: Adult Health

## 2019-05-29 DIAGNOSIS — E1142 Type 2 diabetes mellitus with diabetic polyneuropathy: Secondary | ICD-10-CM | POA: Diagnosis not present

## 2019-05-29 DIAGNOSIS — I639 Cerebral infarction, unspecified: Secondary | ICD-10-CM | POA: Diagnosis not present

## 2019-05-29 DIAGNOSIS — G8194 Hemiplegia, unspecified affecting left nondominant side: Secondary | ICD-10-CM | POA: Diagnosis not present

## 2019-05-29 DIAGNOSIS — M869 Osteomyelitis, unspecified: Secondary | ICD-10-CM | POA: Diagnosis not present

## 2019-05-29 NOTE — Telephone Encounter (Signed)
Copied from [id number]. Topic: Quick Communication - See Telephone Encounter >> May 29, 2019 10:21 AM Nils Flack wrote: CRM for notification. See Telephone encounter for: 05/29/19. Pt says she is handicapp and she needs a home health nurse or nurse aid. Please call pt to update

## 2019-05-30 ENCOUNTER — Other Ambulatory Visit: Payer: Self-pay | Admitting: *Deleted

## 2019-05-30 NOTE — Patient Outreach (Signed)
  [facility]) Care Management Chronic Special Needs Program    05/30/2019  Name: Tara Hopkins, DOB: 1951/02/04  MRN: [id number]   Ms. Tara Hopkins is enrolled in a chronic special needs plan for Diabetes.  Attempted to reach Mrs. Skelton via home number to complete initial telephonic assessment; no answer and HIPAA compliant message left.  Per review of the electronic medical record, patient called primary care office on 05/29/19 requesting home health nurse or aide due to her handicap status.   Plan: Will send in basket message to primary care provider requesting the office make outreach to patient to encourage her to return call to this RNCM so that referral can be made to social swore for evaluation for home health aid. If no return call from patient, will make second outreach call in one week.   Kelli Churn RN, CCM, [facility] Management [phone]

## 2019-05-30 NOTE — Telephone Encounter (Signed)
DENIED.  SENT TO [facility] [address] 05/10/2019.  THANKS

## 2019-05-31 ENCOUNTER — Ambulatory Visit: Payer: HMO | Admitting: *Deleted

## 2019-05-31 ENCOUNTER — Other Ambulatory Visit: Payer: Self-pay | Admitting: *Deleted

## 2019-05-31 NOTE — Patient Outreach (Signed)
  [facility]) Care Management Chronic Special Needs Program    05/31/2019  Name: Tara Hopkins, DOB: [birth year]  MRN: [id number]   Ms. Tara Hopkins is enrolled in a chronic special needs plan for Diabetes.  Received voice mail from Mrs. Riddle at 12:54 pm requesting return call, returned call to her at 12:55 pm;.no answer and HIPAA compliant message left requesting return call.  Plan: Will make another telephone outreach attempt later this week.  Barrington Ellison RN,CCM,CDE [facility] Management Coordinator Office Phone [phone] Office Fax [phone]

## 2019-06-01 NOTE — Telephone Encounter (Signed)
Left a message for a return call.

## 2019-06-02 ENCOUNTER — Other Ambulatory Visit: Payer: Self-pay | Admitting: Adult Health

## 2019-06-02 DIAGNOSIS — F329 Major depressive disorder, single episode, unspecified: Secondary | ICD-10-CM

## 2019-06-02 DIAGNOSIS — I10 Essential (primary) hypertension: Secondary | ICD-10-CM

## 2019-06-02 DIAGNOSIS — Z760 Encounter for issue of repeat prescription: Secondary | ICD-10-CM

## 2019-06-02 DIAGNOSIS — F32A Depression, unspecified: Secondary | ICD-10-CM

## 2019-06-02 NOTE — Telephone Encounter (Signed)
Left a message for a return call.

## 2019-06-02 NOTE — Telephone Encounter (Signed)
Primary Information  Source Subject Topic  Tara Hopkins (Patient) Tara Hopkins (Patient) [facility] Patient (Clinic Use ONLY)  Summary: pt returning call  Reason for CRM: pt returning call to office

## 2019-06-06 ENCOUNTER — Other Ambulatory Visit: Payer: Self-pay | Admitting: *Deleted

## 2019-06-06 ENCOUNTER — Encounter: Payer: Self-pay | Admitting: *Deleted

## 2019-06-06 DIAGNOSIS — C3411 Malignant neoplasm of upper lobe, right bronchus or lung: Secondary | ICD-10-CM

## 2019-06-06 DIAGNOSIS — E1142 Type 2 diabetes mellitus with diabetic polyneuropathy: Secondary | ICD-10-CM

## 2019-06-06 DIAGNOSIS — Z89431 Acquired absence of right foot: Secondary | ICD-10-CM

## 2019-06-06 DIAGNOSIS — E118 Type 2 diabetes mellitus with unspecified complications: Secondary | ICD-10-CM

## 2019-06-06 DIAGNOSIS — I63311 Cerebral infarction due to thrombosis of right middle cerebral artery: Secondary | ICD-10-CM

## 2019-06-06 DIAGNOSIS — I10 Essential (primary) hypertension: Secondary | ICD-10-CM

## 2019-06-06 DIAGNOSIS — I639 Cerebral infarction, unspecified: Secondary | ICD-10-CM

## 2019-06-06 DIAGNOSIS — F3175 Bipolar disorder, in partial remission, most recent episode depressed: Secondary | ICD-10-CM

## 2019-06-06 NOTE — Telephone Encounter (Signed)
DENIED.  SENT TO [id number] [place], [place] ON 05/10/2019.

## 2019-06-06 NOTE — Patient Outreach (Addendum)
[facility]) Care Management Chronic Special Needs Program  06/06/2019  Name: Tara Hopkins DOB: [birth year]  MRN: [id number]  Ms. Tara Hopkins is enrolled in a chronic special needs plan for Diabetes. Chronic Care Management Coordinator telephoned client to review health risk assessment and to develop individualized care plan.  Introduced the chronic care management program, importance of client participation, and taking their care plan to all provider appointments and inpatient facilities.  Reviewed the transition of care process and possible referral to community care management.  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 Tara Hopkins says her blood pressure is "always high" and that her primary care provider is adjusting her medications. She says she has a home blood pressure monitor and she occasionally checks it.  She says she check her blood sugar twice daily and reports her fasting values as 120-130 and her hs readings as 90-120.  She says she is finding it more difficult to perform activities such as cooking, cleaning, bathing, shopping, etc due to the residual effects of 2 strokes, the most recent one in 2018, and severe diabetic neuropathy that she has in her feet and right hand.  She says she is wheelchair bound and lives alone in a handicapped apartment. She says she is able to transfer from her wheelchair independently but cannot walk more than 1-2 steps. She is requesting personal care assistance.  She says she wears a Life Alert necklace that costs $45 per month. She says she uses [employer] and her family and friends for transportation as she cannot drive.  She says she has difficulty at times affording food and her medications and would welcome a referral for assistance.  She says she has 3 children, 2  sons and a daughter and 10 grandchildren. She says her daughter and grandchildren check on her as needed..   Goals Addressed             Client stated goal: " To live as independently as possible and to live in my apartment indefinitely"      This Visit's Progress   . Client understands the importance of follow-up with providers by attending scheduled visits   On track   . Client will have ADL/IADL needs addressed by 09/08/19       Referral made to social worker for eligibility for personal care services    . Client will have food insecurities addressed by 09/08/19       Referred to social worker for community food resources    . Client will report ability to obtain Medications within next 3 months       Referral made to pharmacist for medication copay assistance    . Client will verbalize knowledge of self management of Hypertension as evidences by BP reading of 140/90 or less; or as defined by provider       Assessed medication adherence with medications for blood pressure Educational handout mailed to client on checking your blood pressure at home    . HEMOGLOBIN A1C < 7.0       Client encouraged to avoid sweetened drinks Educational handout mailed to patient on diabetic meal planning    . Maintain timely refills of diabetic medication as prescribed within the year .       Referral to pharmacy for medication copay and coverage gap assistance to  ensure medication adherence is not affected by inability to pay for medications    . Obtain Annual Foot Exam   Not on track   . Obtain annual screen for micro albuminuria (urine) , nephropathy (kidney problems)   Not on track   . Obtain Hemoglobin A1C at least 2 times per year   On track   . Visit Primary Care Provider or Endocrinologist at least 2 times per year    On track    Assessment: Today is Tara Hopkins's birthday so sang Happy Tara Hopkins to her and wished her happy birthday.  Client is not meeting diabetes self-management goal  of hemoglobin A1C of <7% with most recent reading of 8.6% on 01/24/19 without reports of hypoglycemia . Client has good understanding of:  COVID-19 cause, symptoms, precautions (social distancing, stay at home order, hand washing, wear face covering when unable to maintain or ensure 6 foot social distancing), and symptoms requiring provider notification.  Plan:  Send successful outreach letter with a copy of their individualized care plan, send individual care plan to provider and send educational material on home safety, diabetes and blood pressure and diabetic neal planning.  Chronic care management coordination will outreach in:  3 Months  Will refer client to:  Social Work and Pharmacy.   Tara Churn RN, CCM, [facility] Care Management [phone]

## 2019-06-07 ENCOUNTER — Telehealth: Payer: Self-pay | Admitting: Adult Health

## 2019-06-07 NOTE — Telephone Encounter (Signed)
Copied from [place] [phone]. Topic: Quick Communication - Rx Refill/Question >> Jun 07, 2019  5:40 PM Mcneil, Ja-Kwan wrote: Medication: clonazePAM (KLONOPIN) 0.5 MG tablet - Pt also stated that she needs a refill of her diabetes and cholesterol medication but she does not know the name of the medications. Pt stated the list of medications is too long for her to remember.  Has the patient contacted their pharmacy? no  Preferred Pharmacy (with phone number or street name): [id number] [address], [place]. [phone] (Phone) [phone] (Fax)  Agent: Please be advised that RX refills may take up to 3 business days. We ask that you follow-up with your pharmacy.

## 2019-06-08 ENCOUNTER — Other Ambulatory Visit: Payer: Self-pay | Admitting: Pharmacist

## 2019-06-08 NOTE — Patient Outreach (Signed)
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  HIPAA identifiers verified.   Subjective:  Patient reports she is having trouble affording Lantus and Gabapentin.  She states she already uses GoodRx coupons when she can for her medications.  She is not sure how much she has spent so far on medications but that she has been paying for Lantus each month.    Objective: The ASCVD Risk score Mikey Bussing DC Jr., et al., 2013) failed to calculate for the following reasons:   The patient has a prior MI or stroke diagnosis  Lab Results  Component Value Date   CREATININE 1.41 (H) 05/23/2019   CREATININE 1.40 (H) 05/16/2019   CREATININE 1.43 (H) 05/02/2019    Lab Results  Component Value Date   HGBA1C 8.6 (A) 01/24/2019    Lipid Panel     Component Value Date/Time   CHOL 136 08/17/2018 1353   TRIG 101.0 08/17/2018 1353   HDL 47.50 08/17/2018 1353   CHOLHDL 3 08/17/2018 1353   VLDL 20.2 08/17/2018 1353   LDLCALC 68 08/17/2018 1353   LDLDIRECT 85.0 06/10/2017 0841    BP Readings from Last 3 Encounters:  05/25/19 (!) 173/87  05/04/19 (!) 185/85  05/03/19 (!) 163/78    No Known Allergies  Medications Reviewed Today    Reviewed by Barrington Ellison, RN (Registered Nurse) on 06/06/19 at 1613  Med List Status: <None>  Medication Order Taking? Sig Documenting Provider Last Dose Status Informant  amLODipine (NORVASC) 10 MG tablet [id number] Yes Take 1 tablet (10 mg total) by  mouth daily. Nafziger, Tommi Rumps, NP Taking Active   aspirin 325 MG EC tablet [id number] Yes Take 1 tablet (325 mg total) by mouth daily. Nafziger, Tommi Rumps, NP Taking Active   atorvastatin (LIPITOR) 40 MG tablet [id number] Yes Take 1 tablet (40 mg total) by mouth daily. TAKE ONE TABLET BY MOUTH DAILY AT 6 PM Nafziger, Tommi Rumps, NP Taking Active   citalopram (CELEXA) 10 MG tablet [id number] Yes Take 1 tablet (10 mg total) by mouth daily. Nafziger, Tommi Rumps, NP Taking Active   clonazePAM (KLONOPIN) 0.5 MG tablet [id number] Yes TAKE ONE TABLET BY MOUTH THREE TIMES A DAY  Patient taking differently: 0.5 mg at bedtime.    Nafziger, Tommi Rumps, NP Taking Active Self  gabapentin (NEURONTIN) 300 MG capsule [id number] Yes Take 1 capsule (300 mg total) by mouth 3 (three) times daily. Nafziger, Tommi Rumps, NP Taking Active   glimepiride (AMARYL) 4 MG tablet [id number] Yes Take 1 tablet (4 mg total) by mouth daily with breakfast. [name], NP Taking Active   glucose blood (ONETOUCH VERIO) test strip [id number] Yes USE TO TEST BLOOD GLUCOSE 3 TIMES DAILY Nafziger, Tommi Rumps, NP Taking Active Self  Insulin Glargine (LANTUS SOLOSTAR) 100 UNIT/ML Solostar Pen [id number] Yes Inject 40 Units into the skin at bedtime. Nafziger, Tommi Rumps, NP Taking Active   Insulin Pen Needle (B-D UF III MINI PEN NEEDLES) 31G X 5 MM MISC [id number] Yes Use as directed. Nafziger, Tommi Rumps, NP Taking Active Self  latanoprost (XALATAN) 0.005 % ophthalmic solution [id number] Yes INSTILL 1  DROP INTO BOTH EYES EVERY EVENING Nafziger, Tommi Rumps, NP Taking Active   lisinopril (ZESTRIL) 40 MG tablet [id number] Yes Take 1 tablet (40 mg total) by mouth daily. Nafziger, Tommi Rumps, NP Taking Active   ondansetron (ZOFRAN ODT) 4 MG disintegrating tablet [id number] No Take 1 tablet (4 mg total) by mouth every 8 (eight) hours as needed for nausea or vomiting.  Patient not taking: Reported on 06/06/2019   Francine Graven, DO Not Taking Active            Med Note Broadus John, Trude Mcburney   Tue Jun 06, 2019  4:12 PM)  States she only took when she was having nausea from chemo- no longer receiving chemo  ONE TOUCH LANCETS MISC [id number] Yes USE TO CHECK BLOOD SUGAR 3 TIMES DAILY Nafziger, Tommi Rumps, NP Taking Active Self  prochlorperazine (COMPAZINE) 10 MG tablet [id number] No TAKE ONE TABLET BY MOUTH EVERY 6 HOURS AS NEEDED FOR NAUSEA OR VOMITING  Patient not taking: Reported on 06/06/2019   Curt Bears, MD Not Taking Active            Med Note Broadus John, Trude Mcburney   Tue Jun 06, 2019  4:13 PM) Used for nausea related to chemo, no longer receiving chemo so medication not needed  traMADol (ULTRAM) 50 MG tablet [id number] Yes TAKE 1 TABLET (50 MG TOTAL) BY MOUTH 3 (THREE) TIMES DAILY FOR 30 DAYS. [provider] Taking Active           Assessment: Drugs sorted by system:  Neurologic/Psychologic: citalopram, clonazepam, gabapentin  Cardiovascular:  Amlodipine, aspirin 325mg , atorvastatin, lisinopril  Gastrointestinal:ondansetron ODT  Endocrine: glimepiride, insulin glargine  Topical: latanoprost eye drops  Pain:tramadol  Medication Review Findings:  . Patient reports income ~ $28 / month.  She is just over income cut-off for Extra Help unfortunately.  She may qualify for Lantus depending on her TROOP.  If not, can consider substitution from Lantus--> Levemir or Basaglar to apply.   Plan: . Will reach out to [employer] for TROOP to determine which patient assistance program to apply for.    Ralene Bathe, PharmD, [place] [phone]

## 2019-06-08 NOTE — Telephone Encounter (Signed)
Tried reaching the pharmacy,  [place], [place] to cancel the clonazepam prescription that was sent in on 06/02/2019.  Received a busy signal.  Will try again at a later time.

## 2019-06-09 ENCOUNTER — Other Ambulatory Visit: Payer: Self-pay

## 2019-06-09 ENCOUNTER — Other Ambulatory Visit: Payer: Self-pay | Admitting: Pharmacist

## 2019-06-09 NOTE — Patient Outreach (Signed)
[place] [facility]) Care Management  06/09/2019  Pearson Reasons 1951/11/06 [id number]   Social work referral received from [employer], [employer]. "She reports food insecurity at times- would like community resources. She has been homeless in the past and was living at the [employer]  She is handicapped and wheelchair bound;has ADL and IADL deficits. She is requesting personal care services application" Successful outreach to patient today.  BSW and patient discussed food resources in [place] area.  BSW agreed to mail [employer] which provides location and contact information for all kitchens and pantries in the area. BSW and patient discussed personal care services, however, patient reports that she has applied for Medicaid multiple times and does not qualify.  Patient is aware that services would have to be privately paid for since she does not have Medicaid. BSW offered to send her a list of in-home providers but patient declined as she cannot afford private pay. BSW will follow up within the next two weeks to ensure receipt of food resources.  Ronn Melena, BSW Social Worker [phone]

## 2019-06-09 NOTE — Patient Outreach (Addendum)
[place] [facility]) Care Management  [place] 06/09/2019  Tara Hopkins 10/03/1951 [id number]  TROOP = $0 from HTA meaning patient has not had to pay for any of her co-pays yet this year.  She will therefore not be eligible for Lantus patient assistance program.    Outreach:  Unsuccessful telephone call attempt #1 to patient to discuss medication patient assistance programs.   HIPAA compliant voicemail left requesting a return call  Plan:  -I will make another outreach attempt to patient within 3-4 business days.   -Will contact PCP re: substitution from Lantus --> Levemir or [place], PharmD, [place] [phone]   Addendum: Incoming call and voicemail from patient.  Reviewed that she will need to substitute to another insulin to qualify for program.  Patient voiced understanding.  Will mail her application once PCP makes decision on insulin substitution.

## 2019-06-14 ENCOUNTER — Telehealth: Payer: Self-pay | Admitting: *Deleted

## 2019-06-14 ENCOUNTER — Other Ambulatory Visit: Payer: Self-pay | Admitting: Pharmacist

## 2019-06-14 NOTE — Patient Outreach (Signed)
[place] [facility]) Care Management  [place] 06/14/2019  Jacquelynn Friend 1950-12-03 [id number]  Communication received from NP [name] that Lantus --> Basaglar substitution ok in order for patient to apply for patient assistance program through [employer]: I will route patient assistance letter to [place] technician who will coordinate patient assistance program application process for medications listed above.  [facility] pharmacy technician will assist with obtaining all required documents from both patient and provider(s) and submit application(s) once completed.    Ralene Bathe, PharmD, [place] [phone]

## 2019-06-14 NOTE — Telephone Encounter (Signed)
Received vm message from patient asking about appts with RadOnc.  Reviewed Dr. Worthy Flank note from 05/25/19 regarding Radiation treatments.   TCT to Dr. Clabe Seal nurse regarding referral.  She will investigate this and call back tomorrow 06/15/19.

## 2019-06-15 ENCOUNTER — Ambulatory Visit: Payer: HMO | Admitting: Pharmacist

## 2019-06-15 ENCOUNTER — Other Ambulatory Visit: Payer: Self-pay | Admitting: Internal Medicine

## 2019-06-15 DIAGNOSIS — C3411 Malignant neoplasm of upper lobe, right bronchus or lung: Secondary | ICD-10-CM

## 2019-06-15 NOTE — Telephone Encounter (Signed)
Received call from Butch Penny, [place] with Dr. Sondra Come regarding pt's radiation needs.  She states that Dr. Sondra Come will need a new referral for her whole brain radiation.  As patient did receive the concurrent chest radiation while getting her chemotherapy, Butch Penny is asking if patient would need any radiation to her chest.

## 2019-06-15 NOTE — Telephone Encounter (Signed)
Pt has medication.  Did complain that the price went up.  Nothing further needed.

## 2019-06-15 NOTE — Telephone Encounter (Signed)
TCT patient. Spoke with her. Advised that Dr. Julien Nordmann has sent the referral to Radiation Oncology today and that she can expect to hear from them in the next day or two.  Tara Hopkins voiced understanding.  No further questions or concerns.

## 2019-06-15 NOTE — Telephone Encounter (Signed)
See Dr. Worthy Flank response. Thank you

## 2019-06-15 NOTE — Telephone Encounter (Signed)
Yes she will need radiation to the chest as well as PCI.  I will place the referral.  Thank you.

## 2019-06-15 NOTE — Telephone Encounter (Signed)
Note: Correction to earlier not. Pt did NOT receive concurrent radiation while undergoing chemotherapy.

## 2019-06-16 NOTE — Progress Notes (Signed)
Thoracic Location of Tumor / Histology: DIAGNOSIS:Limited stage (T1c, N2, M0) small cell lung cancer presented with left upper lobe lung nodule in addition to mediastinal lymphadenopathy diagnosed in February 2020.  Patient presented: The patient has a fall in December 2019 and she was seen at the emergency department for evaluation. During her evaluation she had chest x-ray performed on October 16, 2018 and that showed prominent right paratracheal density. This was followed by CT scan of the chest on October 28, 2018 and that showed bulky necrotic mediastinal lymphadenopathy including index right paratracheal lymph node measuring 2.3 cm in short axis, subcarinal 3.1 cm.There was also 1.0 cm posterior right upper lobe nodule identified with adjacent tiny satellite nodules and 0.3 cm posterior left upper lobe nodule. A PET scan was performed on December 01, 2018 and that showed mediastinal lymphadenopathy markedly hypermetabolic. No evidence for hypermetabolic lymphadenopathy in the axillary region or abdomen/pelvis. There was also hypermetabolic FDG accumulation in the tonsillar regions bilaterally and posterior left nasopharynx/oropharynx that was indeterminate. The scan also showed 1.0 cm right upper lobe hypermetabolic nodule.  Biopsies of Biopsies revealed: 12/22/18:  Diagnosis FINE NEEDLE ASPIRATION, EBUS 7 NODE, A (SPECIMEN 1 OF 1, COLLECTED 12/22/18): - SMALL CELL CARCINOMA,  Tobacco/Marijuana/Snuff/ETOH BOF:BPZWCHE status: Former Smoker    Packs/day: 0.50   Years: 40.00   Pack years: 20.00   Types: Cigarettes   Last attempt to quit: 10/17/2018   Years since quitting: 0.2 . Smokeless tobacco: none    Past/Anticipated interventions by cardiothoracic surgery, if any: none  Past/Anticipated interventions by medical oncology, if any: Per Dr. Julien Nordmann 05/25/19: ASSESSMENT AND PLAN: This is a very pleasant 68 years old African-American female with limited stage small cell lung  cancer diagnosed in February 2020.  The patient underwent systemic chemotherapy with carboplatin and etoposide status post 6 cycles.  Last dose was given May 02, 2019.  The patient tolerated this course of treatment well with no concerning adverse effects.  She was supposed to have concurrent radiotherapy during her treatment but for some reason this was missed.  She was evaluated in the past by Dr. Sondra Come. She had repeat CT scan of the chest performed recently.  I personally and independently reviewed the scans and discussed the results with the patient today. Her scan showed a stable disease with no concerning findings for progression. I recommended for the patient to see Dr. Sondra Come for evaluation of the radiotherapy option as well as discussion of prophylactic cranial irradiation. I will see her back for follow-up visit in 3 months with repeat CT scan of the chest for restaging of her disease. For hypertension she was advised to take her blood pressure medication as prescribed and to monitor it closely at home.  Signs/Symptoms She denied having any current chest pain, shortness of breath, cough or hemoptysis.  She has no nausea, vomiting, diarrhea or constipation.  She denied having any headache or visual changes.  The patient has no weight loss or night sweats  Pain issues, if any:  Pt denies c/o pain.  SAFETY ISSUES:  Prior radiation? no  Pacemaker/ICD? no  Possible current pregnancy?no  Is the patient on methotrexate? no  Current Complaints / other details:  Pt presents today for follow up new appt with Dr. Sondra Come for Radiation Oncology.   BP (!) 146/72 (BP Location: Left Arm, Patient Position: Sitting)   Pulse (!) 6   Temp 98 F (36.7 C) (Temporal)   Resp 18   SpO2 100%   Wt Readings from  Last 3 Encounters:  05/25/19 164 lb 3 oz (74.5 kg)  05/02/19 168 lb (76.2 kg)  04/10/19 163 lb 12.8 oz (74.3 kg)   Loma Sousa, RN BSN

## 2019-06-19 ENCOUNTER — Other Ambulatory Visit: Payer: Self-pay

## 2019-06-19 ENCOUNTER — Other Ambulatory Visit: Payer: Self-pay | Admitting: Pharmacy Technician

## 2019-06-19 ENCOUNTER — Other Ambulatory Visit: Payer: Self-pay | Admitting: *Deleted

## 2019-06-19 ENCOUNTER — Ambulatory Visit
Admission: RE | Admit: 2019-06-19 | Discharge: 2019-06-19 | Disposition: A | Discharge status: Home / Self Care | Payer: HMO | Source: Ambulatory Visit | Attending: Radiation Oncology | Admitting: Radiation Oncology

## 2019-06-19 ENCOUNTER — Encounter: Payer: Self-pay | Admitting: Radiation Oncology

## 2019-06-19 VITALS — BP 146/72 | HR 6 | Temp 98.0°F | Resp 18

## 2019-06-19 DIAGNOSIS — Z79899 Other long term (current) drug therapy: Secondary | ICD-10-CM | POA: Diagnosis not present

## 2019-06-19 DIAGNOSIS — I700 Atherosclerosis of aorta: Secondary | ICD-10-CM | POA: Insufficient documentation

## 2019-06-19 DIAGNOSIS — Z9221 Personal history of antineoplastic chemotherapy: Secondary | ICD-10-CM | POA: Diagnosis not present

## 2019-06-19 DIAGNOSIS — C3411 Malignant neoplasm of upper lobe, right bronchus or lung: Secondary | ICD-10-CM | POA: Insufficient documentation

## 2019-06-19 DIAGNOSIS — Z794 Long term (current) use of insulin: Secondary | ICD-10-CM | POA: Diagnosis not present

## 2019-06-19 DIAGNOSIS — R590 Localized enlarged lymph nodes: Secondary | ICD-10-CM | POA: Diagnosis not present

## 2019-06-19 DIAGNOSIS — Z7982 Long term (current) use of aspirin: Secondary | ICD-10-CM | POA: Diagnosis not present

## 2019-06-19 DIAGNOSIS — C778 Secondary and unspecified malignant neoplasm of lymph nodes of multiple regions: Secondary | ICD-10-CM | POA: Diagnosis not present

## 2019-06-19 NOTE — Patient Outreach (Signed)
[place] [facility]) Care Management  06/19/2019  Tara Hopkins 1950-12-12 [id number]                          Medication Assistance Referral  Referral From: [place]  Medication/Company: [employer] / Basaglar Patient application portion:  Mailed Provider application portion: Faxed  to C. Nafziger, NP Follow up:  Will follow up with patient in 7-10 business days to confirm application(s) have been received.  Maud Deed Chana Bode [place] Certified Pharmacy Technician [place] Management Direct [phone]

## 2019-06-19 NOTE — Patient Instructions (Signed)
Coronavirus (COVID-19) Are you at risk?  Are you at risk for the Coronavirus (COVID-19)?  To be considered HIGH RISK for Coronavirus (COVID-19), you have to meet the following criteria:  . Traveled to [place], [place], [place], [place] or [place]; or in the [place] to [place], [place], [place], or [place]; and have fever, cough, and shortness of breath within the last 2 weeks of travel OR . Been in close contact with a person diagnosed with COVID-19 within the last 2 weeks and have fever, cough, and shortness of breath . IF YOU DO NOT MEET THESE CRITERIA, YOU ARE CONSIDERED LOW RISK FOR COVID-19.  What to do if you are HIGH RISK for COVID-19?  . If you are having a medical emergency, call 911. . Seek medical care right away. Before you go to a doctor's office, urgent care or emergency department, call ahead and tell them about your recent travel, contact with someone diagnosed with COVID-19, and your symptoms. You should receive instructions from your physician's office regarding next steps of care.  . When you arrive at healthcare provider, tell the healthcare staff immediately you have returned from visiting [place], [place], [place], [place] or [place]; or traveled in the [place] to [place], [place], [place], or [place]; in the last two weeks or you have been in close contact with a person diagnosed with COVID-19 in the last 2 weeks.   . Tell the health care staff about your symptoms: fever, cough and shortness of breath. . After you have been seen by a medical provider, you will be either: o Tested for (COVID-19) and discharged home on quarantine except to seek medical care if symptoms worsen, and asked to  - Stay home and avoid contact with others until you get your results (4-5 days)  - Avoid travel on public transportation if possible (such as bus, train, or airplane) or o Sent to the Emergency Department by EMS for evaluation, COVID-19 testing, and possible  admission depending on your condition and test results.  What to do if you are LOW RISK for COVID-19?  Reduce your risk of any infection by using the same precautions used for avoiding the common cold or flu:  . Wash your hands often with soap and warm water for at least 20 seconds.  If soap and water are not readily available, use an alcohol-based hand sanitizer with at least 60% alcohol.  . If coughing or sneezing, cover your mouth and nose by coughing or sneezing into the elbow areas of your shirt or coat, into a tissue or into your sleeve (not your hands). . Avoid shaking hands with others and consider head nods or verbal greetings only. . Avoid touching your eyes, nose, or mouth with unwashed hands.  . Avoid close contact with people who are sick. . Avoid places or events with large numbers of people in one location, like concerts or sporting events. . Carefully consider travel plans you have or are making. . If you are planning any travel outside or inside the [place], visit the CDC's Travelers' Health webpage for the latest health notices. . If you have some symptoms but not all symptoms, continue to monitor at home and seek medical attention if your symptoms worsen. . If you are having a medical emergency, call 911.   ADDITIONAL HEALTHCARE OPTIONS FOR PATIENTS   Telehealth / e-Visit: [email or web address]         [facility]: [phone]  [facility]: [phone]                   [facility]: [phone]   

## 2019-06-19 NOTE — Progress Notes (Addendum)
Radiation Oncology         [phone] ________________________________  Name: Tara Hopkins MRN: [id number]  Date: 06/19/2019  DOB: 10/03/1951  Re-Evaluation Note  CC: [name], NP  Curt Bears, MD    ICD-10-CM   1. 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 She was last seen in our office on 02/01/2019. She was scheduled to undergo CT simulation on 02/06/2019, but she did not show.  Patient was contacted on several occasions to reschedule her simulation she never returned calls.  She reports falling around that time.  She fortunately proceeded with chemotherapy as recommended.  Since then, she completed systemic chemotherapy, consisting of carboplatin and etoposide x6, under Dr. Julien Nordmann on 05/02/2019.   Most recent chest CT on 05/23/2019 showed: stable to mildly decreased mediastinal adenopathy; no suspicious pulmonary nodules; no evidence disease progression.  She has been kindly referred back to me for consideration of radiation therapy to the chest, as well as prophylactic cranial irradiation.    ALLERGIES:  has No Known Allergies.  Meds: Current Outpatient Medications  Medication Sig Dispense Refill  . amLODipine (NORVASC) 10 MG tablet Take 1 tablet (10 mg total) by mouth daily. 90 tablet 0  . aspirin 325 MG EC tablet Take 1 tablet (325 mg total) by mouth daily. 90 tablet 0  . atorvastatin (LIPITOR) 40 MG tablet Take 1 tablet (40 mg total) by mouth daily. TAKE ONE TABLET BY MOUTH DAILY AT 6 PM 90 tablet 0  . citalopram (CELEXA) 10 MG tablet Take 1 tablet (10 mg total) by mouth daily. 90 tablet 0  . clonazePAM (KLONOPIN) 0.5 MG tablet TAKE ONE TABLET BY MOUTH THREE TIMES A DAY (Patient taking differently: 0.5  mg at bedtime. ) 90 tablet 0  . gabapentin (NEURONTIN) 300 MG capsule Take 1 capsule (300 mg total) by mouth 3 (three) times daily. 270 capsule 0  . glimepiride (AMARYL) 4 MG tablet Take 1 tablet (4 mg total) by mouth daily with breakfast. 90 tablet 0  . glucose blood (ONETOUCH VERIO) test strip USE TO TEST BLOOD GLUCOSE 3 TIMES DAILY 100 each 12  . Insulin Pen Needle (B-D UF III MINI PEN NEEDLES) 31G X 5 MM MISC Use as directed. 100 each 3  . latanoprost (XALATAN) 0.005 % ophthalmic solution INSTILL 1 DROP INTO BOTH EYES EVERY EVENING 7.5 mL 0  . lisinopril (ZESTRIL) 40 MG tablet Take 1 tablet (40 mg total) by mouth daily. 90 tablet 0  . ONE TOUCH LANCETS MISC USE TO CHECK BLOOD SUGAR 3 TIMES DAILY 200 each 3  . traMADol (ULTRAM) 50 MG tablet TAKE 1 TABLET (50 MG TOTAL) BY MOUTH 3 (THREE) TIMES DAILY FOR 30 DAYS.    . Insulin Glargine (BASAGLAR KWIKPEN) 100 UNIT/ML SOPN Inject 0.4 mLs (40 Units total) into the skin at bedtime. 36 mL 0  . ondansetron (ZOFRAN ODT) 4 MG disintegrating tablet Take 1 tablet (4 mg total) by mouth every 8 (eight) hours as needed for nausea or vomiting. (Patient not taking: Reported on 06/19/2019) 6 tablet 0   No current facility-administered medications for this encounter.     Physical Findings: The patient is in no acute distress. Patient is alert and oriented.  temporal temperature is 98  F (36.7 C). Her blood pressure is 146/72 (abnormal) and her pulse is 6 (abnormal). Her respiration is 18 and oxygen saturation is 100%. .  . Lungs are clear to auscultation bilaterally. Heart has regular rate and rhythm. No palpable cervical, supraclavicular, or axillary adenopathy. Abdomen soft, non-tender, normal bowel sounds.  She remains in a wheelchair for the duration of evaluation. 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 Electronically Signed   By: Suzy Bouchard M.D.   On: 05/23/2019 17:55   Result Date: 05/23/2019 CLINICAL DATA:  Small cell lung cancer. Diagnosis February 2020. Chemotherapy radiation therapy complete. EXAM: CT CHEST WITH CONTRAST TECHNIQUE: Multidetector CT imaging of the chest was performed during intravenous contrast administration. CONTRAST:  72mL OMNIPAQUE IOHEXOL 300 MG/ML  SOLN COMPARISON:  CT 03/14/2019, PET-CT 12/01/2018 FINDINGS: Cardiovascular: No significant vascular findings. Normal heart size. No pericardial effusion. 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 Coronary artery calcification and aortic atherosclerotic calcification. Mediastinum/Nodes: Mediastinal lymph nodes stable to slightly decreased. For example RIGHT lower paratracheal lymph node measures 1.7 cm compared to 1.6 cm on prior. Subcarinal lymph node measures 1.9 cm compared to 2.0 cm. No new mediastinal adenopathy. No pericardial fluid. Lungs/Pleura: No suspicious pulmonary nodules. Resolution of previously described nodules. Small sub 5 mm LEFT upper lobe nodules unchanged (image 53/5). Upper Abdomen: Limited view of the liver, kidneys, pancreas are unremarkable. Normal adrenal glands. Musculoskeletal: Degenerative osteophytosis of the spine. IMPRESSION: 1. Stable to mildly decreased mediastinal adenopathy. 2. No suspicious pulmonary nodules. 3. No evidence disease progression. Electronically Signed: By: Suzy Bouchard M.D. On: 05/23/2019 17:47    Impression:  Limited stage (T1c, N2, M0) small cell lung cancer presented with right upper lobe lung nodule in addition to mediastinal lymphadenopathy diagnosed in February 2020.  As above the patient never proceeded with chest  radiation as recommended during her chemotherapy.  She has had a good  response to her chemotherapy, possibly residual disease in the mediastinal area and will be a candidate for chest consolidation at this time.  Would also recommend consideration for prophylactic cranial radiation.  I discussed the course of treatment for the chest and brain with the patient.  We discussed the side effects and potential long-term toxicities of treatments to both areas.  sHe appears to understand and wishes to proceed with planned course of treatment.    Plan: Patient will be scheduled for CT simulation in the near future with treatments to the chest and brain.  Anticipate 6 weeks of radiation therapy to the chest and 2 weeks of prophylactic cranial irradiation.  ____________________________________ Gery Pray, MD   This document serves as a record of services personally performed by Gery Pray, MD. It was created on his behalf by Wilburn Mylar, a trained medical scribe. The creation of this record is based on the scribe's personal observations and the provider's statements to them. This document has been checked and approved by the attending provider.

## 2019-06-19 NOTE — Patient Outreach (Signed)
  [place] [facility]) Care Management Chronic Special Needs Program    06/19/2019  Name: Menaal Russum, DOB: 02/10/1951  MRN: [id number]   Ms. Arcelia Marquina is enrolled in a chronic special needs plan for Diabetes.  Subjective: Returned call to client after she left message earlier today stating a nurse from [facility] came to her house yesterday and took vitals. Client is inquiring when a nurse will come to her house on a regular basis.  Mrs. Suddreth say if she does not qualify for personal care services, which she has requested,  then she wants this RNCM to call her primary care provider and tell him she does not qualify for the services.   Plan: Explained to Mrs Scaglione that according to the [place] [facility] social worker Museum/gallery conservator Chrismon's notes in [place], client does not qualify for personal care assistance through the [place] because she does not have Medicaid. Client says she cannot afford to pay privately for personal care services.  Secure e-mail sent to [place] requesting an outreach to client to discuss special needs plan benefit, if any, related to personal care services.  If client has no benefits for personal care services under the [place], this RNCM will notify her primary care provider.   Barrington Ellison RN,CCM,CDE [place], [place] Management Coordinator Office Phone [phone] Office Fax [phone]

## 2019-06-20 ENCOUNTER — Other Ambulatory Visit: Payer: Self-pay | Admitting: Adult Health

## 2019-06-20 ENCOUNTER — Other Ambulatory Visit: Payer: Self-pay | Admitting: *Deleted

## 2019-06-20 MED ORDER — BASAGLAR KWIKPEN 100 UNIT/ML ~~LOC~~ SOPN: 40.0000 [IU] | PEN_INJECTOR | Freq: Every day | SUBCUTANEOUS | 0 refills | Status: AC

## 2019-06-20 NOTE — Patient Outreach (Signed)
  [place] [facility]) Care Management Chronic Special Needs Program    06/20/2019  Name: Zyann Mabry, DOB: Oct 07, 1951  MRN: [id number]   Ms. Kaliyan Firmin is enrolled in a chronic special needs plan for Diabetes.  Interdisciplinary care team note: Per e-mail direction from assistant director of [place], secure e-mail sent to [facility] with update including: completion of initial telephone assessment by this [place] on 06/06/19; no recent inpatient admission and most recent emergency room visit of 03/27/19 for c/o of nausea, vomiting and diarrhea related to chemotherapy without acute admission;  referral to social worker for [employer] resources;  personal care assistant eligibility without success as client does not qualify for Medicaid; pharmacist for medication copay assistance.  Received reply via secure e-mail from [place] stating that patient is not currently engaged with [facility] and that she is sending the update over to the outreach team today.  Updated primary care provider, [name], via [employer].  Kelli Churn RN, CCM, [place] [facility] Care Management [phone]

## 2019-06-23 ENCOUNTER — Ambulatory Visit: Payer: Self-pay

## 2019-06-23 ENCOUNTER — Other Ambulatory Visit: Payer: Self-pay

## 2019-06-23 NOTE — Patient Outreach (Signed)
[facility] [facility]) Care Management  06/23/2019  Tara Hopkins Jun 19, 1951 [id number]   Unsuccessful outreach to patient to ensure receipt of food resources that were mailed on 06/09/19.  Left voicemail message.  Will attempt to reach again within four business days.  Ronn Melena, BSW Social Worker [phone]

## 2019-06-26 ENCOUNTER — Other Ambulatory Visit: Payer: Self-pay

## 2019-06-26 ENCOUNTER — Ambulatory Visit: Payer: Self-pay

## 2019-06-26 NOTE — Patient Outreach (Signed)
[place] [facility]) Care Management  06/26/2019  Tara Hopkins 1951-03-15 [id number]   Second unsuccessful outreach to patient to ensure receipt of food resources that were mailed on 06/09/19.  Left voicemail message.  Will attempt to reach again within four business days.  Ronn Melena, BSW Social Worker [phone]

## 2019-06-27 ENCOUNTER — Ambulatory Visit: Payer: HMO | Admitting: Podiatry

## 2019-06-28 ENCOUNTER — Other Ambulatory Visit: Payer: Self-pay

## 2019-06-28 ENCOUNTER — Ambulatory Visit: Payer: Self-pay

## 2019-06-28 NOTE — Patient Outreach (Signed)
[place] [facility]) Care Management  06/28/2019  Lulie Hurd 1951/04/18 [id number]   Third unsuccessful outreach to patient to ensure receipt of food resources that were mailed on 06/09/19. Left voicemail message. Will close case if no response by 07/06/2019.  Ronn Melena, BSW Social Worker [phone]

## 2019-06-29 ENCOUNTER — Other Ambulatory Visit: Payer: Self-pay | Admitting: Adult Health

## 2019-06-29 DIAGNOSIS — M869 Osteomyelitis, unspecified: Secondary | ICD-10-CM | POA: Diagnosis not present

## 2019-06-29 DIAGNOSIS — E1142 Type 2 diabetes mellitus with diabetic polyneuropathy: Secondary | ICD-10-CM | POA: Diagnosis not present

## 2019-06-29 DIAGNOSIS — M792 Neuralgia and neuritis, unspecified: Secondary | ICD-10-CM

## 2019-06-29 DIAGNOSIS — G8194 Hemiplegia, unspecified affecting left nondominant side: Secondary | ICD-10-CM | POA: Diagnosis not present

## 2019-06-29 DIAGNOSIS — I639 Cerebral infarction, unspecified: Secondary | ICD-10-CM | POA: Diagnosis not present

## 2019-06-30 NOTE — Telephone Encounter (Signed)
DENIED.  FILLED ON 05/10/2019 FOR 90 DAYS.  REQUEST IS TOO EARLY.  MESSAGE SENT TO THE PHARMACY.

## 2019-07-03 ENCOUNTER — Other Ambulatory Visit: Payer: Self-pay | Admitting: Adult Health

## 2019-07-03 DIAGNOSIS — M792 Neuralgia and neuritis, unspecified: Secondary | ICD-10-CM

## 2019-07-04 ENCOUNTER — Inpatient Hospital Stay: Payer: HMO | Attending: Internal Medicine

## 2019-07-04 NOTE — Progress Notes (Signed)
Nutrition  Called patient for nutrition follow-up.  No answer.  Left message on voice mail with call back number.  Kuron Docken B. Zenia Resides, [place], [name] Registered Dietitian [phone] (pager)

## 2019-07-06 ENCOUNTER — Other Ambulatory Visit: Payer: Self-pay

## 2019-07-06 NOTE — Patient Outreach (Signed)
[place] [facility]) Care Management  07/06/2019  Tara Hopkins [birth year] [id number]   [facility] BSW closing case due to inability to maintain contact.  Ronn Melena, BSW Social Worker [phone]

## 2019-07-11 ENCOUNTER — Telehealth: Payer: Self-pay | Admitting: *Deleted

## 2019-07-11 NOTE — Telephone Encounter (Signed)
Oncology Nurse Navigator Documentation  Oncology Nurse Navigator Flowsheets 07/11/2019  Navigator Location [facility]  Referral Date to RadOnc/MedOnc -  Navigator Encounter Type Telephone/I received a message from [place] that patient is not answering her phone and they are unable to set up her appts.  I called her and left a vm message to call me with my name and phone number.  I called the daughter but was unable to leave a vm message.  I looked at EMR and it shows that several medical professional have tried to reach her but unable starting on 06/20/2019.  I called [employer] to see if they would do a well check on patient.  They updated me that EMS went to this address today due to a fall.  They also updated me that she was not transported via EMS and didn't go to the hospital.  I updated Rad Onc on the information and requested they continue to try and call patient to schedule.   Telephone Outgoing Call  Abnormal Finding Date -  Confirmed Diagnosis Date -  Patient Visit Type -  Treatment Phase Pre-Tx/Tx Discussion  Barriers/Navigation Needs Coordination of Care;Education  Education Other  Interventions Coordination of Care;Education  Coordination of Care Other  Education Method Verbal  Support Groups/Services -  Acuity Level 3  Time Spent with Patient 38

## 2019-07-12 ENCOUNTER — Telehealth: Payer: Self-pay | Admitting: *Deleted

## 2019-07-12 ENCOUNTER — Ambulatory Visit: Payer: HMO | Admitting: Podiatry

## 2019-07-12 NOTE — Telephone Encounter (Signed)
Oncology Nurse Navigator Documentation  Oncology Nurse Navigator Flowsheets 07/12/2019  Navigator Location [facility]  Referral Date to RadOnc/MedOnc -  Navigator Encounter Type Telephone/I called Ms. Gillson to contact her about her radiation treatment needing to be scheduled.  I was unable to reach her but did leave a vm message for her to call me with my name and phone number.    Telephone Outgoing Call  Abnormal Finding Date -  Confirmed Diagnosis Date -  Patient Visit Type -  Treatment Phase Pre-Tx/Tx Discussion  Barriers/Navigation Needs Education  Education Other  Interventions Education  Coordination of Care -  Education Method Verbal  Support Groups/Services -  Acuity Level 1  Time Spent with Patient 15

## 2019-07-13 ENCOUNTER — Telehealth: Payer: Self-pay | Admitting: Adult Health

## 2019-07-13 NOTE — Telephone Encounter (Signed)
Left a message for a return call.  CRM created. 

## 2019-07-13 NOTE — Telephone Encounter (Signed)
Virtual visit. No medication will be sent in until we talk

## 2019-07-13 NOTE — Telephone Encounter (Signed)
Pt called in and was scheduled for an virtual appt with [facility] on 07/14/2019 at 2:00 p.m.

## 2019-07-13 NOTE — Telephone Encounter (Signed)
Pt called in , office on other calls.  Pt wants to make an appt for Friday the the 4th.  Please advised  Best number  [phone]

## 2019-07-13 NOTE — Telephone Encounter (Signed)
Pt states she has been taking 6 gabapentin (NEURONTIN) 300 MG capsule a day.  Rx states 3 a day..  Pt states she is desperate, she does not have ANY left.  She cannot pick up this Rx until Oct.  Would like a call back or call pharmacy to approve she can pick up.

## 2019-07-14 ENCOUNTER — Other Ambulatory Visit: Payer: Self-pay | Admitting: Adult Health

## 2019-07-14 ENCOUNTER — Telehealth: Payer: HMO | Admitting: Adult Health

## 2019-07-14 DIAGNOSIS — M792 Neuralgia and neuritis, unspecified: Secondary | ICD-10-CM

## 2019-07-14 MED ORDER — GABAPENTIN 300 MG PO CAPS: 300.0000 mg | ORAL_CAPSULE | Freq: Three times a day (TID) | ORAL | 0 refills | Status: DC

## 2019-07-19 ENCOUNTER — Other Ambulatory Visit: Payer: Self-pay | Admitting: Pharmacy Technician

## 2019-07-19 NOTE — Patient Outreach (Signed)
[facility] [facility]) Care Management  07/19/2019  Tara Hopkins 04-20-1951 [id number]    Unsuccessful call placed to patient regarding patient assistance application(s) for Basaglar , HIPAA compliant voicemail left.   Follow up:  Will make another call attempt in 3-5 business days if call has not been returned.  Maud Deed Chana Bode [place] Certified Pharmacy Technician [place] Management Direct [phone]

## 2019-07-20 ENCOUNTER — Telehealth: Payer: HMO | Admitting: Adult Health

## 2019-07-21 ENCOUNTER — Telehealth: Payer: HMO | Admitting: Adult Health

## 2019-07-24 ENCOUNTER — Ambulatory Visit
Admission: RE | Admit: 2019-07-24 | Discharge: 2019-07-24 | Disposition: A | Discharge status: Home / Self Care | Payer: HMO | Source: Ambulatory Visit | Attending: Radiation Oncology | Admitting: Radiation Oncology

## 2019-07-24 ENCOUNTER — Other Ambulatory Visit: Payer: Self-pay

## 2019-07-24 DIAGNOSIS — Z510 Encounter for antineoplastic radiation therapy: Secondary | ICD-10-CM | POA: Diagnosis not present

## 2019-07-24 DIAGNOSIS — C3411 Malignant neoplasm of upper lobe, right bronchus or lung: Secondary | ICD-10-CM | POA: Insufficient documentation

## 2019-07-24 NOTE — Progress Notes (Signed)
  Radiation Oncology         [phone] ________________________________  Name: Tara Hopkins MRN: [id number]  Date: 07/24/2019  DOB: 29-Jan-1951  SIMULATION AND TREATMENT PLANNING NOTE    ICD-10-CM   1. 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  Identity was confirmed.  All relevant records and images related to the planned course of therapy were reviewed.  The patient freely provided informed written consent to proceed with treatment after reviewing the details related to the planned course of therapy. The consent form was witnessed and verified by the simulation staff.  Then, the patient was set-up in a stable reproducible  supine position for radiation therapy.  CT images were obtained.  Surface markings were placed.  The CT images were loaded into the planning software.  Then the target and avoidance structures were contoured.  Treatment planning then occurred.  The radiation prescription was entered and confirmed.  Then, I designed and supervised the construction of a total of 5 medically necessary complex treatment devices.  I have requested : 3D Simulation  I have requested a DVH of the following structures: GTV, PTV, heart, lungs, esophagus.  I have ordered:dose calc.  PLAN:  The patient will receive 60 Gy in 30 fractions directed at the mediastinal region.  -----------------------------------  Blair Promise, PhD, MD  This document serves as a record of services personally performed by Gery Pray, MD. It was created on his behalf by Wilburn Mylar, a trained medical scribe. The creation of this record is based on the scribe's personal observations and the provider's statements to them. This document has  been checked and approved by the attending provider.

## 2019-07-25 ENCOUNTER — Telehealth (INDEPENDENT_AMBULATORY_CARE_PROVIDER_SITE_OTHER): Payer: HMO | Admitting: Adult Health

## 2019-07-25 DIAGNOSIS — M792 Neuralgia and neuritis, unspecified: Secondary | ICD-10-CM

## 2019-07-25 MED ORDER — GABAPENTIN 400 MG PO CAPS: 400.0000 mg | ORAL_CAPSULE | Freq: Three times a day (TID) | ORAL | 0 refills | Status: DC

## 2019-07-25 MED ORDER — TRAMADOL HCL 50 MG PO TABS: 100.0000 mg | ORAL_TABLET | Freq: Two times a day (BID) | ORAL | 0 refills | Status: AC

## 2019-07-25 NOTE — Progress Notes (Signed)
  Radiation Oncology         [phone] ________________________________  Name: Tara Hopkins MRN: [id number]  Date: 07/24/2019  DOB: [birth year]  SIMULATION AND TREATMENT PLANNING NOTE    ICD-10-CM   1. 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  Identity was confirmed.  All relevant records and images related to the planned course of therapy were reviewed.  The patient freely provided informed written consent to proceed with treatment after reviewing the details related to the planned course of therapy. The consent form was witnessed and verified by the simulation staff.  Then, the patient was set-up in a stable reproducible  supine position for radiation therapy.  CT images were obtained.  Surface markings were placed.  The CT images were loaded into the planning software.  Then the target and avoidance structures were contoured.  Treatment planning then occurred.  The radiation prescription was entered and confirmed.  Then, I designed and supervised the construction of a total of 5 medically necessary complex treatment devices.  I have requested : Isodose Plan.  I have ordered:dose calc.  PLAN:  The patient will receive 25 Gy in 10 fractions directed at the brain as prophylaxis.    -----------------------------------  Blair Promise, PhD, MD

## 2019-07-25 NOTE — Progress Notes (Signed)
Virtual Visit via Telephone Note  I connected with Tara Hopkins on 07/25/19 at  1:30 PM EDT by telephone and verified that I am speaking with the correct person using two identifiers.   I discussed the limitations, risks, security and privacy concerns of performing an evaluation and management service by telephone and the availability of in person appointments. I also discussed with the patient that there may be a patient responsible charge related to this service. The patient expressed understanding and agreed to proceed.  Location patient: home Location provider: work or home office Participants present for the call: patient, provider Patient did not have a visit in the prior 7 days to address this/these issue(s).   History of Present Illness:  68-year-old female who is being evaluated today for neuropathic pain.  She is currently prescribed gabapentin 300 mg 3 times daily as well as tramadol 50 mg 3 times daily.  She reports that she has increased her gabapentin 900 mg 3 times a day and also subsequently ran out of medication.  She reports no better pain control with the 900 mg.  She does report that tramadol helps relieve some of the pain in her lower extremities.  She is not taking any over-the-counter medication with her prescription medications.  Diabetes has been long uncontrolled, likely due to poor diet, inactivity, and some noncompliance with her medications.   Observations/Objective: Patient sounds cheerful and well on the phone. I do not appreciate any SOB. Speech and thought processing are grossly intact. Patient reported vitals:  Assessment and Plan: 1. Neuropathic pain - I do not want her titrating medications on her own.  Will increase gabapentin to 400 mg 3 times daily and increase tramadol to 100 mg twice daily.  She was encouraged to work on lowering her blood sugars which will hopefully help with neuropathy.  Follow-up in 30 days in the office - gabapentin  (NEURONTIN) 400 MG capsule; Take 1 capsule (400 mg total) by mouth 3 (three) times daily.  Dispense: 270 capsule; Refill: 0 - traMADol (ULTRAM) 50 MG tablet; Take 2 tablets (100 mg total) by mouth 2 (two) times daily.  Dispense: 120 tablet; Refill: 0   Follow Up Instructions:  I did not refer this patient for an OV in the next 24 hours for this/these issue(s).  I discussed the assessment and treatment plan with the patient. The patient was provided an opportunity to ask questions and all were answered. The patient agreed with the plan and demonstrated an understanding of the instructions.   The patient was advised to call back or seek an in-person evaluation if the symptoms worsen or if the condition fails to improve as anticipated.  I provided 18 minutes of non-face-to-face time during this encounter.   [name], NP

## 2019-07-26 DIAGNOSIS — C3411 Malignant neoplasm of upper lobe, right bronchus or lung: Secondary | ICD-10-CM | POA: Diagnosis not present

## 2019-07-26 DIAGNOSIS — Z510 Encounter for antineoplastic radiation therapy: Secondary | ICD-10-CM | POA: Diagnosis not present

## 2019-07-27 ENCOUNTER — Other Ambulatory Visit: Payer: Self-pay

## 2019-07-27 ENCOUNTER — Ambulatory Visit
Admission: RE | Admit: 2019-07-27 | Discharge: 2019-07-27 | Disposition: A | Discharge status: Home / Self Care | Payer: HMO | Source: Ambulatory Visit | Attending: Radiation Oncology | Admitting: Radiation Oncology

## 2019-07-27 DIAGNOSIS — Z510 Encounter for antineoplastic radiation therapy: Secondary | ICD-10-CM | POA: Diagnosis not present

## 2019-07-27 DIAGNOSIS — C3411 Malignant neoplasm of upper lobe, right bronchus or lung: Secondary | ICD-10-CM | POA: Diagnosis not present

## 2019-07-28 ENCOUNTER — Other Ambulatory Visit: Payer: Self-pay

## 2019-07-28 ENCOUNTER — Ambulatory Visit
Admission: RE | Admit: 2019-07-28 | Discharge: 2019-07-28 | Disposition: A | Discharge status: Home / Self Care | Payer: HMO | Source: Ambulatory Visit | Attending: Radiation Oncology | Admitting: Radiation Oncology

## 2019-07-28 DIAGNOSIS — C3411 Malignant neoplasm of upper lobe, right bronchus or lung: Secondary | ICD-10-CM | POA: Diagnosis not present

## 2019-07-28 DIAGNOSIS — Z510 Encounter for antineoplastic radiation therapy: Secondary | ICD-10-CM | POA: Diagnosis not present

## 2019-07-30 DIAGNOSIS — M869 Osteomyelitis, unspecified: Secondary | ICD-10-CM | POA: Diagnosis not present

## 2019-07-30 DIAGNOSIS — I639 Cerebral infarction, unspecified: Secondary | ICD-10-CM | POA: Diagnosis not present

## 2019-07-30 DIAGNOSIS — E1142 Type 2 diabetes mellitus with diabetic polyneuropathy: Secondary | ICD-10-CM | POA: Diagnosis not present

## 2019-07-30 DIAGNOSIS — G8194 Hemiplegia, unspecified affecting left nondominant side: Secondary | ICD-10-CM | POA: Diagnosis not present

## 2019-07-31 ENCOUNTER — Other Ambulatory Visit: Payer: Self-pay | Admitting: Pharmacy Technician

## 2019-07-31 ENCOUNTER — Ambulatory Visit: Payer: HMO

## 2019-07-31 NOTE — Patient Outreach (Signed)
[place] [facility]) Care Management  07/31/2019  Lotta [place] November 15, 1950 [id number]    Unsuccessful call placed to patient regarding patient assistance application(s) for Basaglar , HIPAA compliant voicemail left.   Follow up:  Will route note to [place] for case closure  Maud Deed. Chana Bode [place] Certified Pharmacy Technician [place] Management Direct Dial:[phone]

## 2019-08-01 ENCOUNTER — Other Ambulatory Visit: Payer: Self-pay | Admitting: Pharmacist

## 2019-08-01 ENCOUNTER — Ambulatory Visit: Payer: HMO

## 2019-08-01 NOTE — Patient Outreach (Signed)
[place] [facility]) Care Management [place]  08/01/2019  Tara Hopkins Jan 19, 1951 [id number]  Reason for referral: medication assistance  -Patient assistance program applications mailed to patient on 06/19/2019 and have not been returned.  Multiple phone call attempts to patient have been made without any return calls.    [facility] pharmacy case is being closed due to the following reasons:  -Inability to maintain contact with patient    Ralene Bathe, PharmD, [place] [phone]

## 2019-08-02 ENCOUNTER — Ambulatory Visit: Payer: HMO

## 2019-08-03 ENCOUNTER — Other Ambulatory Visit: Payer: Self-pay

## 2019-08-03 ENCOUNTER — Ambulatory Visit
Admission: RE | Admit: 2019-08-03 | Discharge: 2019-08-03 | Disposition: A | Discharge status: Home / Self Care | Payer: HMO | Source: Ambulatory Visit | Attending: Radiation Oncology | Admitting: Radiation Oncology

## 2019-08-03 ENCOUNTER — Other Ambulatory Visit: Payer: Self-pay | Admitting: Radiation Oncology

## 2019-08-03 DIAGNOSIS — C3411 Malignant neoplasm of upper lobe, right bronchus or lung: Secondary | ICD-10-CM | POA: Diagnosis not present

## 2019-08-03 DIAGNOSIS — Z510 Encounter for antineoplastic radiation therapy: Secondary | ICD-10-CM | POA: Diagnosis not present

## 2019-08-03 MED ORDER — PROCHLORPERAZINE MALEATE 10 MG PO TABS: 10.0000 mg | ORAL_TABLET | Freq: Four times a day (QID) | ORAL | 0 refills | Status: DC | PRN

## 2019-08-04 ENCOUNTER — Telehealth: Payer: Self-pay

## 2019-08-04 ENCOUNTER — Ambulatory Visit: Payer: HMO

## 2019-08-04 NOTE — Telephone Encounter (Signed)
Attempted to contact pt per Dr. Sondra Come. Pt had been seen for undertreatment visit 9/24 with c/o N/V and diarrhea. Pt had been encouraged to use Imodium for diarrhea. Dr. Sondra Come had sent Compazine into pharmacy of choice, [facility] on [address].   No answer. Voice mailbox is full and was unable to leave VM. Pt scheduled for radiation treatment as 1435. Loma Sousa, RN BSN

## 2019-08-07 ENCOUNTER — Ambulatory Visit
Admission: RE | Admit: 2019-08-07 | Discharge: 2019-08-07 | Disposition: A | Discharge status: Home / Self Care | Payer: HMO | Source: Ambulatory Visit | Attending: Radiation Oncology | Admitting: Radiation Oncology

## 2019-08-07 ENCOUNTER — Other Ambulatory Visit: Payer: Self-pay

## 2019-08-07 DIAGNOSIS — C3411 Malignant neoplasm of upper lobe, right bronchus or lung: Secondary | ICD-10-CM | POA: Diagnosis not present

## 2019-08-07 DIAGNOSIS — Z510 Encounter for antineoplastic radiation therapy: Secondary | ICD-10-CM | POA: Diagnosis not present

## 2019-08-08 ENCOUNTER — Telehealth: Payer: Self-pay

## 2019-08-08 ENCOUNTER — Ambulatory Visit: Payer: HMO

## 2019-08-08 ENCOUNTER — Encounter (HOSPITAL_BASED_OUTPATIENT_CLINIC_OR_DEPARTMENT_OTHER): Payer: HMO | Attending: Internal Medicine

## 2019-08-08 NOTE — Telephone Encounter (Signed)
Attempted to reach pt to inquire pt's status. Pt has not arrived to radiation appointment. VM mailbox was full, unable to leave VM. L3 notified, therapist to attempt call. Loma Sousa, RN BSN

## 2019-08-09 ENCOUNTER — Ambulatory Visit
Admission: RE | Admit: 2019-08-09 | Discharge: 2019-08-09 | Disposition: A | Discharge status: Home / Self Care | Payer: HMO | Source: Ambulatory Visit | Attending: Radiation Oncology | Admitting: Radiation Oncology

## 2019-08-09 ENCOUNTER — Other Ambulatory Visit: Payer: Self-pay

## 2019-08-09 DIAGNOSIS — C3411 Malignant neoplasm of upper lobe, right bronchus or lung: Secondary | ICD-10-CM | POA: Diagnosis not present

## 2019-08-09 DIAGNOSIS — Z510 Encounter for antineoplastic radiation therapy: Secondary | ICD-10-CM | POA: Diagnosis not present

## 2019-08-10 ENCOUNTER — Other Ambulatory Visit: Payer: Self-pay

## 2019-08-10 ENCOUNTER — Ambulatory Visit
Admission: RE | Admit: 2019-08-10 | Discharge: 2019-08-10 | Disposition: A | Discharge status: Home / Self Care | Payer: HMO | Source: Ambulatory Visit | Attending: Radiation Oncology | Admitting: Radiation Oncology

## 2019-08-10 DIAGNOSIS — Z510 Encounter for antineoplastic radiation therapy: Secondary | ICD-10-CM | POA: Insufficient documentation

## 2019-08-10 DIAGNOSIS — C3411 Malignant neoplasm of upper lobe, right bronchus or lung: Secondary | ICD-10-CM | POA: Diagnosis not present

## 2019-08-11 ENCOUNTER — Ambulatory Visit: Payer: HMO

## 2019-08-14 ENCOUNTER — Ambulatory Visit: Payer: HMO

## 2019-08-14 ENCOUNTER — Ambulatory Visit
Admission: RE | Admit: 2019-08-14 | Discharge: 2019-08-14 | Disposition: A | Discharge status: Home / Self Care | Payer: HMO | Source: Ambulatory Visit | Attending: Radiation Oncology | Admitting: Radiation Oncology

## 2019-08-14 ENCOUNTER — Other Ambulatory Visit: Payer: Self-pay

## 2019-08-14 DIAGNOSIS — Z510 Encounter for antineoplastic radiation therapy: Secondary | ICD-10-CM | POA: Diagnosis not present

## 2019-08-14 DIAGNOSIS — C3411 Malignant neoplasm of upper lobe, right bronchus or lung: Secondary | ICD-10-CM | POA: Diagnosis not present

## 2019-08-15 ENCOUNTER — Ambulatory Visit
Admission: RE | Admit: 2019-08-15 | Discharge: 2019-08-15 | Disposition: A | Discharge status: Home / Self Care | Payer: HMO | Source: Ambulatory Visit | Attending: Radiation Oncology | Admitting: Radiation Oncology

## 2019-08-15 DIAGNOSIS — Z510 Encounter for antineoplastic radiation therapy: Secondary | ICD-10-CM | POA: Diagnosis not present

## 2019-08-15 DIAGNOSIS — C3411 Malignant neoplasm of upper lobe, right bronchus or lung: Secondary | ICD-10-CM | POA: Diagnosis not present

## 2019-08-16 ENCOUNTER — Ambulatory Visit
Admission: RE | Admit: 2019-08-16 | Discharge: 2019-08-16 | Disposition: A | Discharge status: Home / Self Care | Payer: HMO | Source: Ambulatory Visit | Attending: Radiation Oncology | Admitting: Radiation Oncology

## 2019-08-16 DIAGNOSIS — Z510 Encounter for antineoplastic radiation therapy: Secondary | ICD-10-CM | POA: Diagnosis not present

## 2019-08-16 DIAGNOSIS — C3411 Malignant neoplasm of upper lobe, right bronchus or lung: Secondary | ICD-10-CM | POA: Diagnosis not present

## 2019-08-17 ENCOUNTER — Ambulatory Visit
Admission: RE | Admit: 2019-08-17 | Discharge: 2019-08-17 | Disposition: A | Discharge status: Home / Self Care | Payer: HMO | Source: Ambulatory Visit | Attending: Radiation Oncology | Admitting: Radiation Oncology

## 2019-08-17 DIAGNOSIS — C3411 Malignant neoplasm of upper lobe, right bronchus or lung: Secondary | ICD-10-CM | POA: Diagnosis not present

## 2019-08-17 DIAGNOSIS — Z510 Encounter for antineoplastic radiation therapy: Secondary | ICD-10-CM | POA: Diagnosis not present

## 2019-08-18 ENCOUNTER — Ambulatory Visit
Admission: RE | Admit: 2019-08-18 | Discharge: 2019-08-18 | Disposition: A | Discharge status: Home / Self Care | Payer: HMO | Source: Ambulatory Visit | Attending: Radiation Oncology | Admitting: Radiation Oncology

## 2019-08-18 ENCOUNTER — Other Ambulatory Visit: Payer: Self-pay

## 2019-08-18 DIAGNOSIS — Z510 Encounter for antineoplastic radiation therapy: Secondary | ICD-10-CM | POA: Diagnosis not present

## 2019-08-18 DIAGNOSIS — C3411 Malignant neoplasm of upper lobe, right bronchus or lung: Secondary | ICD-10-CM | POA: Diagnosis not present

## 2019-08-21 ENCOUNTER — Ambulatory Visit
Admission: RE | Admit: 2019-08-21 | Discharge: 2019-08-21 | Disposition: A | Discharge status: Home / Self Care | Payer: HMO | Source: Ambulatory Visit | Attending: Radiation Oncology | Admitting: Radiation Oncology

## 2019-08-21 ENCOUNTER — Other Ambulatory Visit: Payer: Self-pay

## 2019-08-21 DIAGNOSIS — Z510 Encounter for antineoplastic radiation therapy: Secondary | ICD-10-CM | POA: Diagnosis not present

## 2019-08-21 DIAGNOSIS — C3411 Malignant neoplasm of upper lobe, right bronchus or lung: Secondary | ICD-10-CM | POA: Diagnosis not present

## 2019-08-22 ENCOUNTER — Other Ambulatory Visit: Payer: Self-pay

## 2019-08-22 ENCOUNTER — Ambulatory Visit
Admission: RE | Admit: 2019-08-22 | Discharge: 2019-08-22 | Disposition: A | Discharge status: Home / Self Care | Payer: HMO | Source: Ambulatory Visit | Attending: Radiation Oncology | Admitting: Radiation Oncology

## 2019-08-22 DIAGNOSIS — Z510 Encounter for antineoplastic radiation therapy: Secondary | ICD-10-CM | POA: Diagnosis not present

## 2019-08-22 DIAGNOSIS — C3411 Malignant neoplasm of upper lobe, right bronchus or lung: Secondary | ICD-10-CM | POA: Diagnosis not present

## 2019-08-23 ENCOUNTER — Other Ambulatory Visit: Payer: Self-pay

## 2019-08-23 ENCOUNTER — Ambulatory Visit
Admission: RE | Admit: 2019-08-23 | Discharge: 2019-08-23 | Disposition: A | Discharge status: Home / Self Care | Payer: HMO | Source: Ambulatory Visit | Attending: Radiation Oncology | Admitting: Radiation Oncology

## 2019-08-23 DIAGNOSIS — Z510 Encounter for antineoplastic radiation therapy: Secondary | ICD-10-CM | POA: Diagnosis not present

## 2019-08-23 DIAGNOSIS — C3411 Malignant neoplasm of upper lobe, right bronchus or lung: Secondary | ICD-10-CM | POA: Diagnosis not present

## 2019-08-24 ENCOUNTER — Other Ambulatory Visit: Payer: Self-pay | Admitting: *Deleted

## 2019-08-24 ENCOUNTER — Ambulatory Visit
Admission: RE | Admit: 2019-08-24 | Discharge: 2019-08-24 | Disposition: A | Discharge status: Home / Self Care | Payer: HMO | Source: Ambulatory Visit | Attending: Radiation Oncology | Admitting: Radiation Oncology

## 2019-08-24 ENCOUNTER — Ambulatory Visit: Payer: Self-pay | Admitting: *Deleted

## 2019-08-24 ENCOUNTER — Other Ambulatory Visit: Payer: Self-pay

## 2019-08-24 ENCOUNTER — Ambulatory Visit: Payer: HMO

## 2019-08-24 DIAGNOSIS — C3411 Malignant neoplasm of upper lobe, right bronchus or lung: Secondary | ICD-10-CM | POA: Diagnosis not present

## 2019-08-24 DIAGNOSIS — Z510 Encounter for antineoplastic radiation therapy: Secondary | ICD-10-CM | POA: Diagnosis not present

## 2019-08-24 NOTE — Patient Outreach (Signed)
  [place] [facility]) Care Management Chronic Special Needs Program    08/24/2019  Name: Tara Hopkins, DOB: [birth year]  MRN: [id number]   Ms. Assia Freundlich is enrolled in a chronic special needs plan for Diabetes.  Attempted to reach Mrs. Bon via contact number to complete follow up assessment; no answer; left HIPAA compliant message requesting return call.  Plan: If client does not return call, will make second outreach call within one week.  Kelli Churn RN, CCM, [place] [facility] Care Management [phone]

## 2019-08-25 ENCOUNTER — Other Ambulatory Visit: Payer: Self-pay

## 2019-08-25 ENCOUNTER — Ambulatory Visit: Payer: Self-pay | Admitting: *Deleted

## 2019-08-25 ENCOUNTER — Ambulatory Visit (HOSPITAL_COMMUNITY)
Admission: RE | Admit: 2019-08-25 | Discharge: 2019-08-25 | Disposition: A | Discharge status: Home / Self Care | Payer: HMO | Source: Ambulatory Visit | Attending: Internal Medicine | Admitting: Internal Medicine

## 2019-08-25 ENCOUNTER — Encounter (HOSPITAL_COMMUNITY): Payer: Self-pay

## 2019-08-25 ENCOUNTER — Ambulatory Visit
Admission: RE | Admit: 2019-08-25 | Discharge: 2019-08-25 | Disposition: A | Discharge status: Home / Self Care | Payer: HMO | Source: Ambulatory Visit | Attending: Radiation Oncology | Admitting: Radiation Oncology

## 2019-08-25 ENCOUNTER — Other Ambulatory Visit: Payer: Self-pay | Admitting: *Deleted

## 2019-08-25 ENCOUNTER — Inpatient Hospital Stay: Payer: HMO | Attending: Internal Medicine

## 2019-08-25 DIAGNOSIS — Z510 Encounter for antineoplastic radiation therapy: Secondary | ICD-10-CM | POA: Diagnosis not present

## 2019-08-25 DIAGNOSIS — C3412 Malignant neoplasm of upper lobe, left bronchus or lung: Secondary | ICD-10-CM | POA: Insufficient documentation

## 2019-08-25 DIAGNOSIS — Z9221 Personal history of antineoplastic chemotherapy: Secondary | ICD-10-CM | POA: Insufficient documentation

## 2019-08-25 DIAGNOSIS — C3411 Malignant neoplasm of upper lobe, right bronchus or lung: Secondary | ICD-10-CM | POA: Diagnosis not present

## 2019-08-25 DIAGNOSIS — C3490 Malignant neoplasm of unspecified part of unspecified bronchus or lung: Secondary | ICD-10-CM | POA: Diagnosis not present

## 2019-08-25 LAB — CBC WITH DIFFERENTIAL (CANCER CENTER ONLY)
Abs Immature Granulocytes: 0.01 10*3/uL (ref 0.00–0.07)
Basophils Absolute: 0 10*3/uL (ref 0.0–0.1)
Basophils Relative: 1 %
Eosinophils Absolute: 0.1 10*3/uL (ref 0.0–0.5)
Eosinophils Relative: 3 %
HCT: 35.4 % — ABNORMAL LOW (ref 36.0–46.0)
Hemoglobin: 11.4 g/dL — ABNORMAL LOW (ref 12.0–15.0)
Immature Granulocytes: 0 %
Lymphocytes Relative: 19 %
Lymphs Abs: 0.8 10*3/uL (ref 0.7–4.0)
MCH: 29.4 pg (ref 26.0–34.0)
MCHC: 32.2 g/dL (ref 30.0–36.0)
MCV: 91.2 fL (ref 80.0–100.0)
Monocytes Absolute: 0.4 10*3/uL (ref 0.1–1.0)
Monocytes Relative: 8 %
Neutro Abs: 3.1 10*3/uL (ref 1.7–7.7)
Neutrophils Relative %: 69 %
Platelet Count: 220 10*3/uL (ref 150–400)
RBC: 3.88 MIL/uL (ref 3.87–5.11)
RDW: 13.1 % (ref 11.5–15.5)
WBC Count: 4.4 10*3/uL (ref 4.0–10.5)
nRBC: 0 % (ref 0.0–0.2)

## 2019-08-25 LAB — CMP (CANCER CENTER ONLY)
ALT: 18 U/L (ref 0–44)
AST: 16 U/L (ref 15–41)
Albumin: 3.6 g/dL (ref 3.5–5.0)
Alkaline Phosphatase: 79 U/L (ref 38–126)
Anion gap: 10 (ref 5–15)
BUN: 23 mg/dL (ref 8–23)
CO2: 27 mmol/L (ref 22–32)
Calcium: 9.4 mg/dL (ref 8.9–10.3)
Chloride: 105 mmol/L (ref 98–111)
Creatinine: 1.61 mg/dL — ABNORMAL HIGH (ref 0.44–1.00)
GFR, Est AFR Am: 38 mL/min — ABNORMAL LOW (ref >60)
GFR, Estimated: 33 mL/min — ABNORMAL LOW (ref >60)
Glucose, Bld: 187 mg/dL — ABNORMAL HIGH (ref 70–99)
Potassium: 4.9 mmol/L (ref 3.5–5.1)
Sodium: 142 mmol/L (ref 135–145)
Total Bilirubin: 0.3 mg/dL (ref 0.3–1.2)
Total Protein: 7 g/dL (ref 6.5–8.1)

## 2019-08-25 MED ORDER — SODIUM CHLORIDE (PF) 0.9 % IJ SOLN
INTRAMUSCULAR | Status: AC
  Filled 2019-08-25: qty 50

## 2019-08-25 MED ORDER — IOHEXOL 300 MG/ML  SOLN
75.0000 mL | Freq: Once | INTRAMUSCULAR | Status: AC | PRN
  Administered 2019-08-25: 60 mL via INTRAVENOUS

## 2019-08-25 NOTE — Patient Outreach (Signed)
[facility]) Care Management Chronic Special Needs Program  08/25/2019  Name: Tara Hopkins DOB: 12-Oct-1951  MRN: [id number]  Ms. Tara Hopkins is enrolled in a chronic special needs plan for Diabetes. Reviewed and updated care plan.  Subjective: Reached Mrs Tara Hopkins via contact after she left message for this RNCM on 08/24/19 in response to message left for her requesting return call. Spoke briefly with Mrs Tara Hopkins before the call was disconnected. Was able to ascertain that she is currently receiving radiation therapy for her small cell lung cancer Monday through Friday and will finish the therapy in one week.  Inquired about the [place] [facility] pharmacist unable to contact her to assist with obtaining basaglar assistance. Mrs. Tara Hopkins stated the process and paperwork were too much trouble then the call was disconnected. Attempted to call Mrs Tara Hopkins back but the call went unanswered and this RNCM was not able to leave a phone message because the voice mailbox was full.  Goals Addressed            This Visit's Progress     Patient Stated   . " I want to remain in my apartment indefinitely and live as independently as possible" (pt-stated)       Client remains in her apartment living independently as of 08/25/19.      Other   . Client understands the importance of follow-up with providers by attending scheduled visits       Completed telehealth visit with primary care provider on 07/25/19; no follow up appointment currently scheduled.     . COMPLETED: Client will have ADL/IADL needs addressed by 09/08/19       Referral made to social worker for eligibility for personal care services Per social worker consult done 06/09/19  client is not eligible for personal care services via the [place] but she could receive the services via private pay. Client states she cannot afford to private pay  for services.     . COMPLETED: Client will have food insecurities addressed by 09/08/19       Referred to social worker for community food resources Per social work consult completed 06/09/19 , community food resources via "the [facility]" were mailed to client.     . Client will not report change from baseline and no repeated symptoms of stroke with in the next 4 months    On track   . Maintain timely refills of diabetic medication as prescribed within the year .   On track    Referral to pharmacy for medication copay and coverage gap assistance to ensure medication adherence is not affected by inability to pay for medications 08/25/19 Medication dispense report reviewed and client is refilling medications in timely manner    . Obtain annual  Lipid Profile, LDL-C   Not on track    Most recent lipid panel was completed 08/17/18    . Obtain Annual Eye (retinal)  Exam        Unable to find results of diabetic eye exam in electronic medical record or KPN (Know[employer] Now- point of care tool)     . Obtain Annual Foot Exam       Most recent diabetic foot exam per KPN (Know[employer] Now point of care tool) was completed 03/17/17    . Obtain annual screen for micro albuminuria (urine) , nephropathy (kidney problems)       Unable to find results of urine  microalbumin in electronic medical record or [employer] Now (point of care tool)    . Obtain Hemoglobin A1C at least 2 times per year       Most recent Hgb A1C was completed on 01/24/19- result was 8.6%    . Visit Primary Care Provider or Endocrinologist at least 2 times per year        Completed telehealth visit with primary care provider on 07/25/19     Assessment: Unable to complete assessment as call was disconnected and attempt to return call to client unsuccessful.  Plan:   Care plan updated based on data retrieved from chart review and KPN (Knowledge Performance Now point of care tool).  Will attempt  another telephone outreach to client within one week in order to complete follow up assessment.   Kelli Churn RN, CCM, [facility] [phone]

## 2019-08-27 ENCOUNTER — Other Ambulatory Visit: Payer: Self-pay | Admitting: Adult Health

## 2019-08-27 DIAGNOSIS — I10 Essential (primary) hypertension: Secondary | ICD-10-CM

## 2019-08-28 ENCOUNTER — Telehealth: Payer: Self-pay | Admitting: *Deleted

## 2019-08-28 ENCOUNTER — Inpatient Hospital Stay (HOSPITAL_BASED_OUTPATIENT_CLINIC_OR_DEPARTMENT_OTHER): Payer: HMO | Admitting: Internal Medicine

## 2019-08-28 ENCOUNTER — Other Ambulatory Visit: Payer: Self-pay

## 2019-08-28 ENCOUNTER — Encounter: Payer: Self-pay | Admitting: Internal Medicine

## 2019-08-28 ENCOUNTER — Ambulatory Visit
Admission: RE | Admit: 2019-08-28 | Discharge: 2019-08-28 | Disposition: A | Discharge status: Home / Self Care | Payer: HMO | Source: Ambulatory Visit | Attending: Radiation Oncology | Admitting: Radiation Oncology

## 2019-08-28 VITALS — BP 165/84 | HR 77 | Temp 98.7°F | Resp 17 | Ht 65.0 in | Wt 157.2 lb

## 2019-08-28 DIAGNOSIS — C3411 Malignant neoplasm of upper lobe, right bronchus or lung: Secondary | ICD-10-CM

## 2019-08-28 DIAGNOSIS — I10 Essential (primary) hypertension: Secondary | ICD-10-CM | POA: Diagnosis not present

## 2019-08-28 DIAGNOSIS — C3412 Malignant neoplasm of upper lobe, left bronchus or lung: Secondary | ICD-10-CM | POA: Diagnosis not present

## 2019-08-28 DIAGNOSIS — Z510 Encounter for antineoplastic radiation therapy: Secondary | ICD-10-CM | POA: Diagnosis not present

## 2019-08-28 NOTE — Progress Notes (Signed)
Tara Hopkins [phone]   [phone]  OFFICE PROGRESS NOTE  Tara Peng, NP [place] [address]  DIAGNOSIS: Limited stage (T1c, N2, M0) small cell lung cancer presented with left upper lobe lung nodule in addition to mediastinal lymphadenopathy diagnosed in February 2020.  PRIOR THERAPY:  1) Systemic chemotherapy with carboplatin for AUC of 5 on day 1 and etoposide at 100 mg/M2 on days 1, 2 and 3 with Neulasta support every 3 weeks. First dose January 16, 2019. Status post cycle6. Last dose was given May 02, 2019 2) prophylactic cranial irradiation under the care of Dr. Sondra Come   CURRENT THERAPY:  Observation.  INTERVAL HISTORY: Tara Hopkins 68 y.o. female returns to the clinic today for follow-up visit.  The patient is feeling fine today with no concerning complaints except for the weakness in her lower extremities.  She came sitting in a wheelchair.  She would like to have her motorized wheelchair if possible.  She denied having any current chest pain, shortness of breath, cough or hemoptysis.  She denied having any fever or chills.  She has no nausea, vomiting, diarrhea or constipation.  She has no headache or visual changes.  She tolerated the prophylactic cranial irradiation fairly well.  The patient had repeat CT scan of the chest performed recently and she is here for evaluation and discussion of her scan results.  MEDICAL HISTORY: Past Medical History:  Diagnosis Date  . Anemia   . Anxiety   . Bipolar 1 disorder ([place])   . Cataracts, bilateral   . Depression   . Diabetes mellitus without complication ([place])   . Glaucoma   . History of blood transfusion   . History of hyperbaric oxygen therapy    pt reports 80 treatments.  . Hypercholesteremia   . Hypertension   . Mediastinal adenopathy   . Peripheral neuropathy   . Pneumonia   . SCL ca Dx'd 12/2018  . Stroke ([place]) 02/2015  . Wears glasses      ALLERGIES:  has No Known Allergies.  MEDICATIONS:  Current Outpatient Medications  Medication Sig Dispense Refill  . amLODipine (NORVASC) 10 MG tablet Take 1 tablet (10 mg total) by mouth daily. 90 tablet 0  . aspirin 325 MG EC tablet Take 1 tablet (325 mg total) by mouth daily. 90 tablet 0  . atorvastatin (LIPITOR) 40 MG tablet Take 1 tablet (40 mg total) by mouth daily. TAKE ONE TABLET BY MOUTH DAILY AT 6 PM 90 tablet 0  . citalopram (CELEXA) 10 MG tablet Take 1 tablet (10 mg total) by mouth daily. 90 tablet 0  . clonazePAM (KLONOPIN) 0.5 MG tablet TAKE ONE TABLET BY MOUTH THREE TIMES A DAY (Patient taking differently: 0.5 mg at bedtime. ) 90 tablet 0  . gabapentin (NEURONTIN) 400 MG capsule Take 1 capsule (400 mg total) by mouth 3 (three) times daily. 270 capsule 0  . glimepiride (AMARYL) 4 MG tablet Take 1 tablet (4 mg total) by mouth daily with breakfast. 90 tablet 0  . glucose blood (ONETOUCH VERIO) test strip USE TO TEST BLOOD GLUCOSE 3 TIMES DAILY 100 each 12  . Insulin Glargine (BASAGLAR KWIKPEN) 100 UNIT/ML SOPN Inject 0.4 mLs (40 Units total) into the skin at bedtime. 36 mL 0  . Insulin Pen Needle (B-D UF III MINI PEN NEEDLES) 31G X 5 MM MISC Use as directed. 100 each 3  . latanoprost (XALATAN) 0.005 % ophthalmic solution INSTILL 1 DROP  INTO BOTH EYES EVERY EVENING 7.5 mL 0  . lisinopril (ZESTRIL) 40 MG tablet Take 1 tablet (40 mg total) by mouth daily. 90 tablet 0  . ONE TOUCH LANCETS MISC USE TO CHECK BLOOD SUGAR 3 TIMES DAILY 200 each 3  . prochlorperazine (COMPAZINE) 10 MG tablet Take 1 tablet (10 mg total) by mouth every 6 (six) hours as needed for nausea or vomiting. 30 tablet 0   No current facility-administered medications for this visit.     SURGICAL HISTORY:  Past Surgical History:  Procedure Laterality Date  . AMPUTATION Right 12/15/2013   Procedure: Right Transmetatarsal Amputation;  Surgeon: Newt Minion, MD;  Location: [facility];  Service: Orthopedics;  Laterality:  Right;  . AMPUTATION Right 02/23/2014   Procedure: AMPUTATION FOOT;  Surgeon: Newt Minion, MD;  Location: [facility];  Service: Orthopedics;  Laterality: Right;  Right Foot Revision Transmetatarsal Amputation  . CATARACT EXTRACTION W/ INTRAOCULAR LENS  IMPLANT, BILATERAL    . COLONOSCOPY W/ BIOPSIES AND POLYPECTOMY    . I&D EXTREMITY Right 05/23/2013   Procedure: IRRIGATION AND DEBRIDEMENT RIGHT GREAT TOE;  Surgeon: Mauri Pole, MD;  Location: [facility];  Service: Orthopedics;  Laterality: Right;  . TUBAL LIGATION    . VIDEO BRONCHOSCOPY WITH ENDOBRONCHIAL ULTRASOUND N/A 12/22/2018   Procedure: VIDEO BRONCHOSCOPY WITH ENDOBRONCHIAL ULTRASOUND;  Surgeon: [name], DO;  Location: [facility];  Service: Thoracic;  Laterality: N/A;    REVIEW OF SYSTEMS:  A comprehensive review of systems was negative except for: Musculoskeletal: positive for muscle weakness   PHYSICAL EXAMINATION: General appearance: alert, cooperative, fatigued and no distress Head: Normocephalic, without obvious abnormality, atraumatic Neck: no adenopathy, no JVD, supple, symmetrical, trachea midline and thyroid not enlarged, symmetric, no tenderness/mass/nodules Lymph nodes: Cervical, supraclavicular, and axillary nodes normal. Resp: clear to auscultation bilaterally Back: symmetric, no curvature. ROM normal. No CVA tenderness. Cardio: regular rate and rhythm, S1, S2 normal, no murmur, click, rub or gallop GI: soft, non-tender; bowel sounds normal; no masses,  no organomegaly Extremities: extremities normal, atraumatic, no cyanosis or edema  ECOG PERFORMANCE STATUS: 1 - Symptomatic but completely ambulatory  Blood pressure (!) 165/84, pulse 77, temperature 98.7 F (37.1 C), temperature source Temporal, resp. rate 17, height 5\' 5"  (1.651 m), weight 157 lb 3.2 oz (71.3 kg), SpO2 100 %.  LABORATORY DATA: Lab Results  Component Value Date   WBC 4.4 08/25/2019   HGB 11.4 (L) 08/25/2019   HCT 35.4 (L) 08/25/2019   MCV 91.2  08/25/2019   PLT 220 08/25/2019      Chemistry      Component Value Date/Time   NA 142 08/25/2019 0911   K 4.9 08/25/2019 0911   CL 105 08/25/2019 0911   CO2 27 08/25/2019 0911   BUN 23 08/25/2019 0911   CREATININE 1.61 (H) 08/25/2019 0911      Component Value Date/Time   CALCIUM 9.4 08/25/2019 0911   ALKPHOS 79 08/25/2019 0911   AST 16 08/25/2019 0911   ALT 18 08/25/2019 0911   BILITOT 0.3 08/25/2019 0911       RADIOGRAPHIC STUDIES: Ct Chest W Contrast  Result Date: 08/25/2019 CLINICAL DATA:  Limited stage small cell lung cancer status post chemotherapy completed May 2020. Restaging. EXAM: CT CHEST WITH CONTRAST TECHNIQUE: Multidetector CT imaging of the chest was performed during intravenous contrast administration. CONTRAST:  81mL OMNIPAQUE IOHEXOL 300 MG/ML  SOLN COMPARISON:  05/23/2019 chest CT. FINDINGS: Cardiovascular: Normal heart size. Small anterior pericardial effusion is slightly  increased. Three-vessel coronary atherosclerosis. Atherosclerotic nonaneurysmal thoracic aorta. Normal caliber pulmonary arteries. No central pulmonary emboli. Mediastinum/Nodes: No discrete thyroid nodules. Unremarkable esophagus. No axillary adenopathy. Borderline prominent 0.9 cm right prevascular node (series 2/image 33), previously 0.9 cm, stable. Right paratracheal adenopathy up to 2.1 cm short axis diameter (series 2/image 46), previously 1.9 cm using similar measurement technique, minimally increased. Enlarged 1.6 cm subcarinal node (series 2/image 60), previously 1.6 cm, stable. Stable mildly enlarged 1.1 cm right hilar node (series 2/image 57). No new pathologically enlarged mediastinal or hilar nodes. Lungs/Pleura: No pneumothorax. No pleural effusion. No acute consolidative airspace disease, lung masses or significant pulmonary nodules. Upper abdomen: No acute abnormality. Musculoskeletal: No aggressive appearing focal osseous lesions. Moderate thoracic spondylosis. IMPRESSION: Right  paratracheal lymphadenopathy is minimally increased. Otherwise stable mediastinal and right hilar adenopathy. No new or additional progressive findings of metastatic disease in the chest. Aortic Atherosclerosis (ICD10-I70.0). Electronically Signed   By: Ilona Sorrel M.D.   On: 08/25/2019 12:03    ASSESSMENT AND PLAN: This is a very pleasant 68 years old African-American female with limited stage small cell lung cancer diagnosed in February 2020.  The patient underwent systemic chemotherapy with carboplatin and etoposide status post 6 cycles.  Last dose was given May 02, 2019.  The patient tolerated this course of treatment well with no concerning adverse effects.  She was supposed to have concurrent radiotherapy during her treatment but for some reason this was missed.  She was evaluated in the past by Dr. Sondra Come. She also underwent prophylactic cranial irradiation under the care of Dr. Sondra Come. The patient has been on observation and she is feeling fine. She had repeat CT scan of the chest performed recently.  I personally and independently reviewed the scans and discussed the results with the patient today. Her scan showed no concerning findings for disease recurrence or progression except for a slight increase in right paratracheal lymph node that need close monitoring on the upcoming imaging studies. I recommended for her to continue on observation with repeat CT scan of the chest in 3 months. We will try to see if we can arrange for the patient to have her motorized wheelchair. She was advised to call immediately if she has any concerning symptoms in the interval. The patient voices understanding of current disease status and treatment options and is in agreement with the current care plan.  All questions were answered. The patient knows to call the clinic with any problems, questions or concerns. We can certainly see the patient much sooner if necessary.  Disclaimer: This note was dictated with  voice recognition software. Similar sounding words can inadvertently be transcribed and may not be corrected upon review.

## 2019-08-28 NOTE — Telephone Encounter (Signed)
Copied from [place] [phone]. Topic: Quick Communication - See Telephone Encounter >> Aug 28, 2019 10:18 AM Loma Boston wrote: CRM for notification. See Telephone encounter for: 08/28/19. Pt was a call back Cory's nurse, going to cancer center and she took more medication than she should because her feet were hurting this week end,  3 more pills states that the radiation is killing her feet [phone]

## 2019-08-29 ENCOUNTER — Other Ambulatory Visit: Payer: Self-pay

## 2019-08-29 ENCOUNTER — Other Ambulatory Visit: Payer: Self-pay | Admitting: Adult Health

## 2019-08-29 ENCOUNTER — Ambulatory Visit
Admission: RE | Admit: 2019-08-29 | Discharge: 2019-08-29 | Disposition: A | Discharge status: Home / Self Care | Payer: HMO | Source: Ambulatory Visit | Attending: Radiation Oncology | Admitting: Radiation Oncology

## 2019-08-29 DIAGNOSIS — I10 Essential (primary) hypertension: Secondary | ICD-10-CM

## 2019-08-29 DIAGNOSIS — C3411 Malignant neoplasm of upper lobe, right bronchus or lung: Secondary | ICD-10-CM | POA: Diagnosis not present

## 2019-08-29 DIAGNOSIS — Z510 Encounter for antineoplastic radiation therapy: Secondary | ICD-10-CM | POA: Diagnosis not present

## 2019-08-29 MED ORDER — AMLODIPINE BESYLATE 10 MG PO TABS: 10.0000 mg | ORAL_TABLET | Freq: Every day | ORAL | 0 refills | Status: AC

## 2019-08-30 ENCOUNTER — Other Ambulatory Visit: Payer: Self-pay

## 2019-08-30 ENCOUNTER — Ambulatory Visit
Admission: RE | Admit: 2019-08-30 | Discharge: 2019-08-30 | Disposition: A | Discharge status: Home / Self Care | Payer: HMO | Source: Ambulatory Visit | Attending: Radiation Oncology | Admitting: Radiation Oncology

## 2019-08-30 ENCOUNTER — Telehealth: Payer: Self-pay | Admitting: Medical Oncology

## 2019-08-30 ENCOUNTER — Telehealth: Payer: Self-pay | Admitting: Internal Medicine

## 2019-08-30 DIAGNOSIS — C3411 Malignant neoplasm of upper lobe, right bronchus or lung: Secondary | ICD-10-CM | POA: Diagnosis not present

## 2019-08-30 DIAGNOSIS — Z510 Encounter for antineoplastic radiation therapy: Secondary | ICD-10-CM | POA: Diagnosis not present

## 2019-08-30 NOTE — Telephone Encounter (Signed)
Faxed wheelchair request to pts PCP Janna Arch

## 2019-08-30 NOTE — Telephone Encounter (Signed)
Lets see if that helps. No more taking more gabapentin then prescribed

## 2019-08-30 NOTE — Telephone Encounter (Signed)
She has been out of tramadol but stated she is picking it up today when she goes out to cancer center.

## 2019-08-30 NOTE — Telephone Encounter (Signed)
Was the tramadol working for her when she had it last month?

## 2019-08-30 NOTE — Telephone Encounter (Signed)
Left a message for a return call.

## 2019-08-30 NOTE — Telephone Encounter (Signed)
Spoke to the pt.  She wanted to confess to [facility] that she took more gabapentin than she should have in 1 day.  She stated the radiation treatment made her feet hurt so she took 3 tablets at one time and had more later in the day but could not tell me how much.  Will forward to [facility].

## 2019-08-30 NOTE — Telephone Encounter (Signed)
Pt returned call,Misty is at lunch,  advised per PCP not. Call pt back if need be.

## 2019-08-30 NOTE — Telephone Encounter (Signed)
Scheduled appt per 10/19 los - mailed reminder letter with appt date and time

## 2019-08-31 ENCOUNTER — Other Ambulatory Visit: Payer: Self-pay

## 2019-08-31 ENCOUNTER — Ambulatory Visit
Admission: RE | Admit: 2019-08-31 | Discharge: 2019-08-31 | Disposition: A | Discharge status: Home / Self Care | Payer: HMO | Source: Ambulatory Visit | Attending: Radiation Oncology | Admitting: Radiation Oncology

## 2019-08-31 DIAGNOSIS — C3411 Malignant neoplasm of upper lobe, right bronchus or lung: Secondary | ICD-10-CM | POA: Diagnosis not present

## 2019-08-31 DIAGNOSIS — Z510 Encounter for antineoplastic radiation therapy: Secondary | ICD-10-CM | POA: Diagnosis not present

## 2019-08-31 NOTE — Telephone Encounter (Signed)
Left a message for a return call.

## 2019-09-01 ENCOUNTER — Ambulatory Visit: Payer: HMO

## 2019-09-01 ENCOUNTER — Ambulatory Visit: Payer: Self-pay | Admitting: *Deleted

## 2019-09-01 NOTE — Telephone Encounter (Signed)
Left a message for a return call.

## 2019-09-04 ENCOUNTER — Other Ambulatory Visit: Payer: Self-pay | Admitting: Internal Medicine

## 2019-09-04 ENCOUNTER — Other Ambulatory Visit: Payer: Self-pay

## 2019-09-04 ENCOUNTER — Telehealth: Payer: Self-pay | Admitting: *Deleted

## 2019-09-04 ENCOUNTER — Other Ambulatory Visit: Payer: Self-pay | Admitting: *Deleted

## 2019-09-04 ENCOUNTER — Ambulatory Visit
Admission: RE | Admit: 2019-09-04 | Discharge: 2019-09-04 | Disposition: A | Discharge status: Home / Self Care | Payer: HMO | Source: Ambulatory Visit | Attending: Radiation Oncology | Admitting: Radiation Oncology

## 2019-09-04 DIAGNOSIS — C3411 Malignant neoplasm of upper lobe, right bronchus or lung: Secondary | ICD-10-CM | POA: Diagnosis not present

## 2019-09-04 DIAGNOSIS — Z510 Encounter for antineoplastic radiation therapy: Secondary | ICD-10-CM | POA: Diagnosis not present

## 2019-09-04 MED ORDER — NICOTINE 14 MG/24HR TD PT24: 14.0000 mg | MEDICATED_PATCH | Freq: Every day | TRANSDERMAL | 0 refills | Status: DC

## 2019-09-04 NOTE — Telephone Encounter (Signed)
RX will be sent to her Pharmacy.

## 2019-09-04 NOTE — Patient Outreach (Signed)
  [facility]) Care Management Chronic Special Needs Program  09/04/2019  Name: Tara Hopkins DOB: Nov 07, 1951  MRN: [id number]  Ms. [name] is enrolled in a chronic special needs plan for Diabetes. Reviewed and updated care plan.  Third outreach attempt to reach [name] via mobile number to complete follow up assessment; no answer; left HIPAA compliant message requesting return call.  Plan: If client does not return call within the next business day, will update care plan based on available medical record data and send to client and primary care provider.  Kelli Churn RN, CCM, [facility] [phone]

## 2019-09-04 NOTE — Telephone Encounter (Signed)
Requested to see pt in the main lobby. Pt is requesting a prescription for Nicotine Patches.  She has cut back some on her smoking but does continue to smoke.  She sates she needs some help with this.  Please advise.

## 2019-09-05 ENCOUNTER — Other Ambulatory Visit: Payer: Self-pay

## 2019-09-05 ENCOUNTER — Other Ambulatory Visit: Payer: Self-pay | Admitting: Radiation Oncology

## 2019-09-05 ENCOUNTER — Ambulatory Visit
Admission: RE | Admit: 2019-09-05 | Discharge: 2019-09-05 | Disposition: A | Discharge status: Home / Self Care | Payer: HMO | Source: Ambulatory Visit | Attending: Radiation Oncology | Admitting: Radiation Oncology

## 2019-09-05 ENCOUNTER — Telehealth: Payer: Self-pay | Admitting: *Deleted

## 2019-09-05 DIAGNOSIS — Z510 Encounter for antineoplastic radiation therapy: Secondary | ICD-10-CM | POA: Diagnosis not present

## 2019-09-05 DIAGNOSIS — C3411 Malignant neoplasm of upper lobe, right bronchus or lung: Secondary | ICD-10-CM | POA: Diagnosis not present

## 2019-09-05 NOTE — Telephone Encounter (Signed)
TCT patient regarding nicoderm patch prescription. No answer but was able to leave VM message letting patient know that this prescription has been sent to her pharmacy per Dr. Julien Nordmann

## 2019-09-06 ENCOUNTER — Ambulatory Visit: Payer: HMO

## 2019-09-06 ENCOUNTER — Other Ambulatory Visit: Payer: Self-pay | Admitting: Adult Health

## 2019-09-06 DIAGNOSIS — F32A Depression, unspecified: Secondary | ICD-10-CM

## 2019-09-06 DIAGNOSIS — Z760 Encounter for issue of repeat prescription: Secondary | ICD-10-CM

## 2019-09-06 DIAGNOSIS — F329 Major depressive disorder, single episode, unspecified: Secondary | ICD-10-CM

## 2019-09-06 NOTE — Telephone Encounter (Signed)
Requested medication (s) are due for refill today: yes  Requested medication (s) are on the active medication list: yes  Last refill:  06/02/2019  Future visit scheduled: no  Notes to clinic: refill cannot be delegated    Requested Prescriptions  Pending Prescriptions Disp Refills   clonazePAM (KLONOPIN) 0.5 MG tablet 90 tablet 0    Sig: Take 1 tablet (0.5 mg total) by mouth 3 (three) times daily.     Not Delegated - Psychiatry:  Anxiolytics/Hypnotics Failed - 09/06/2019  3:25 PM      Failed - This refill cannot be delegated      Passed - Urine Drug Screen completed in last 360 days.      Passed - Valid encounter within last 6 months    Recent Outpatient Visits          1 month ago Neuropathic pain   Therapist, music at [facility], NP   4 months ago Neuropathic pain   Therapist, music at [facility], [place], NP   5 months ago Need for home health care   [employer] [facility], [place], NP   7 months ago Essential hypertension   Therapist, music at [facility], [place], NP   10 months ago Abnormal CBC measurement   Therapist, music at [facility], [place], [place]

## 2019-09-06 NOTE — Telephone Encounter (Signed)
Copied from [place] [id number]. Topic: Quick Communication - Rx Refill/Question >> Sep 06, 2019  2:44 PM Yvette Rack wrote: Medication: clonazePAM (KLONOPIN) 0.5 MG tablet  Has the patient contacted their pharmacy? no  Preferred Pharmacy (with phone number or street name): [id number] [address], [place]. [phone] (Phone) [phone] (Fax)  Agent: Please be advised that RX refills may take up to 3 business days. We ask that you follow-up with your pharmacy.

## 2019-09-07 ENCOUNTER — Encounter: Payer: Self-pay | Admitting: *Deleted

## 2019-09-07 ENCOUNTER — Ambulatory Visit: Payer: HMO

## 2019-09-07 ENCOUNTER — Other Ambulatory Visit: Payer: Self-pay | Admitting: *Deleted

## 2019-09-07 MED ORDER — CLONAZEPAM 0.5 MG PO TABS: 0.5000 mg | ORAL_TABLET | Freq: Three times a day (TID) | ORAL | 0 refills | Status: AC

## 2019-09-08 ENCOUNTER — Other Ambulatory Visit: Payer: Self-pay

## 2019-09-08 ENCOUNTER — Ambulatory Visit: Payer: HMO

## 2019-09-08 ENCOUNTER — Ambulatory Visit
Admission: RE | Admit: 2019-09-08 | Discharge: 2019-09-08 | Disposition: A | Discharge status: Home / Self Care | Payer: HMO | Source: Ambulatory Visit | Attending: Radiation Oncology | Admitting: Radiation Oncology

## 2019-09-08 DIAGNOSIS — C3411 Malignant neoplasm of upper lobe, right bronchus or lung: Secondary | ICD-10-CM | POA: Diagnosis not present

## 2019-09-08 DIAGNOSIS — Z510 Encounter for antineoplastic radiation therapy: Secondary | ICD-10-CM | POA: Diagnosis not present

## 2019-09-08 NOTE — Patient Outreach (Signed)
  [facility]) Care Management Chronic Special Needs Program  09/08/2019  Name: Saraya Tirey DOB: 03-26-1951  MRN: [id number]  Ms. Kenita Yousuf is enrolled in a chronic special needs plan for Diabetes. Client has not responded to three telephone outreach attempts this month by this RNCM so individualized care plan was updated based on available data.  Goals Addressed            This Visit's Progress     Patient Stated   . " I want to remain in my apartment indefinitely and live as independently as possible" (pt-stated)   On track    Client remains in her apartment living independently as of 08/25/19.      Other   . Client understands the importance of follow-up with providers by attending scheduled visits   On track    Completed telehealth visit with primary care provider on 07/25/19; no follow up appointment currently scheduled.     . Client will not report change from baseline and no repeated symptoms of stroke with in the next 4 months    On track   . Client will report abillity to obtain Medications within next 3 months   On track    Referral made to pharmacist for medication copay assistance Pharmacist contacted client on 06/08/19 , the pharmacist contacted primary care provider to ask for Basaglar substitute for Lantus and pharmacy technician has applied for [place] patient assistance via "[employer]" and mailed forms to client on 06/19/19 for completion and return. Pharmacist closed client to medication assistance on 08/01/19 as client has been unable to reach.      Plan:  Send unsuccessful outreach letter with a copy of their individualized care plan            Send individual care plan to provider           Chronic care management coordinator will outreach in:  3 Months  Kelli Churn RN, CCM, [facility] Coordinator [facility] [phone]     .

## 2019-09-11 ENCOUNTER — Other Ambulatory Visit: Payer: Self-pay | Admitting: Adult Health

## 2019-09-11 ENCOUNTER — Other Ambulatory Visit: Payer: Self-pay

## 2019-09-11 ENCOUNTER — Ambulatory Visit: Payer: HMO

## 2019-09-11 ENCOUNTER — Ambulatory Visit
Admission: RE | Admit: 2019-09-11 | Discharge: 2019-09-11 | Disposition: A | Discharge status: Home / Self Care | Payer: HMO | Source: Ambulatory Visit | Attending: Radiation Oncology | Admitting: Radiation Oncology

## 2019-09-11 DIAGNOSIS — C3411 Malignant neoplasm of upper lobe, right bronchus or lung: Secondary | ICD-10-CM | POA: Diagnosis not present

## 2019-09-11 DIAGNOSIS — Z510 Encounter for antineoplastic radiation therapy: Secondary | ICD-10-CM | POA: Diagnosis not present

## 2019-09-11 DIAGNOSIS — Z760 Encounter for issue of repeat prescription: Secondary | ICD-10-CM

## 2019-09-12 ENCOUNTER — Ambulatory Visit
Admission: RE | Admit: 2019-09-12 | Discharge: 2019-09-12 | Disposition: A | Discharge status: Home / Self Care | Payer: HMO | Source: Ambulatory Visit | Attending: Radiation Oncology | Admitting: Radiation Oncology

## 2019-09-12 ENCOUNTER — Ambulatory Visit: Payer: HMO

## 2019-09-12 ENCOUNTER — Other Ambulatory Visit: Payer: Self-pay

## 2019-09-12 DIAGNOSIS — C3411 Malignant neoplasm of upper lobe, right bronchus or lung: Secondary | ICD-10-CM | POA: Diagnosis not present

## 2019-09-12 DIAGNOSIS — Z510 Encounter for antineoplastic radiation therapy: Secondary | ICD-10-CM | POA: Diagnosis not present

## 2019-09-13 ENCOUNTER — Ambulatory Visit: Payer: HMO

## 2019-09-13 ENCOUNTER — Ambulatory Visit
Admission: RE | Admit: 2019-09-13 | Discharge: 2019-09-13 | Disposition: A | Discharge status: Home / Self Care | Payer: HMO | Source: Ambulatory Visit | Attending: Radiation Oncology | Admitting: Radiation Oncology

## 2019-09-13 ENCOUNTER — Other Ambulatory Visit: Payer: Self-pay

## 2019-09-13 DIAGNOSIS — Z510 Encounter for antineoplastic radiation therapy: Secondary | ICD-10-CM | POA: Diagnosis not present

## 2019-09-13 DIAGNOSIS — C3411 Malignant neoplasm of upper lobe, right bronchus or lung: Secondary | ICD-10-CM | POA: Diagnosis not present

## 2019-09-13 NOTE — Telephone Encounter (Signed)
Left a message for a return call.

## 2019-09-14 ENCOUNTER — Ambulatory Visit: Payer: HMO

## 2019-09-14 ENCOUNTER — Other Ambulatory Visit: Payer: Self-pay

## 2019-09-14 ENCOUNTER — Ambulatory Visit
Admission: RE | Admit: 2019-09-14 | Discharge: 2019-09-14 | Disposition: A | Discharge status: Home / Self Care | Payer: HMO | Source: Ambulatory Visit | Attending: Radiation Oncology | Admitting: Radiation Oncology

## 2019-09-14 DIAGNOSIS — C3411 Malignant neoplasm of upper lobe, right bronchus or lung: Secondary | ICD-10-CM | POA: Diagnosis not present

## 2019-09-14 DIAGNOSIS — Z510 Encounter for antineoplastic radiation therapy: Secondary | ICD-10-CM | POA: Diagnosis not present

## 2019-09-14 NOTE — Telephone Encounter (Signed)
Pt has been scheduled for motorized wheelchair appointment.

## 2019-09-15 ENCOUNTER — Other Ambulatory Visit: Payer: Self-pay

## 2019-09-15 ENCOUNTER — Ambulatory Visit: Payer: HMO

## 2019-09-15 ENCOUNTER — Ambulatory Visit
Admission: RE | Admit: 2019-09-15 | Discharge: 2019-09-15 | Disposition: A | Discharge status: Home / Self Care | Payer: HMO | Source: Ambulatory Visit | Attending: Radiation Oncology | Admitting: Radiation Oncology

## 2019-09-15 DIAGNOSIS — Z510 Encounter for antineoplastic radiation therapy: Secondary | ICD-10-CM | POA: Diagnosis not present

## 2019-09-15 DIAGNOSIS — C3411 Malignant neoplasm of upper lobe, right bronchus or lung: Secondary | ICD-10-CM | POA: Diagnosis not present

## 2019-09-18 ENCOUNTER — Ambulatory Visit
Admission: RE | Admit: 2019-09-18 | Discharge: 2019-09-18 | Disposition: A | Discharge status: Home / Self Care | Payer: HMO | Source: Ambulatory Visit | Attending: Radiation Oncology | Admitting: Radiation Oncology

## 2019-09-18 ENCOUNTER — Other Ambulatory Visit: Payer: Self-pay

## 2019-09-18 ENCOUNTER — Ambulatory Visit: Payer: HMO

## 2019-09-18 DIAGNOSIS — C3411 Malignant neoplasm of upper lobe, right bronchus or lung: Secondary | ICD-10-CM | POA: Diagnosis not present

## 2019-09-18 DIAGNOSIS — Z510 Encounter for antineoplastic radiation therapy: Secondary | ICD-10-CM | POA: Diagnosis not present

## 2019-09-19 ENCOUNTER — Encounter: Payer: Self-pay | Admitting: Radiation Oncology

## 2019-09-19 ENCOUNTER — Other Ambulatory Visit: Payer: Self-pay

## 2019-09-19 ENCOUNTER — Ambulatory Visit: Payer: HMO | Admitting: Podiatry

## 2019-09-19 ENCOUNTER — Ambulatory Visit: Payer: HMO

## 2019-09-19 ENCOUNTER — Telehealth: Payer: Self-pay | Admitting: *Deleted

## 2019-09-19 ENCOUNTER — Ambulatory Visit
Admission: RE | Admit: 2019-09-19 | Discharge: 2019-09-19 | Disposition: A | Discharge status: Home / Self Care | Payer: HMO | Source: Ambulatory Visit | Attending: Radiation Oncology | Admitting: Radiation Oncology

## 2019-09-19 DIAGNOSIS — C3411 Malignant neoplasm of upper lobe, right bronchus or lung: Secondary | ICD-10-CM | POA: Diagnosis not present

## 2019-09-19 DIAGNOSIS — Z510 Encounter for antineoplastic radiation therapy: Secondary | ICD-10-CM | POA: Diagnosis not present

## 2019-09-19 NOTE — Telephone Encounter (Signed)
Copied from [place] [phone]. Topic: Quick Communication - See Telephone Encounter >> Sep 19, 2019 10:09 AM Loma [place] wrote: CRM for notification. See Telephone encounter for: 11/10/2 pt wants the nurse to call her before her appt on 11/25 [phone]

## 2019-09-20 ENCOUNTER — Ambulatory Visit (INDEPENDENT_AMBULATORY_CARE_PROVIDER_SITE_OTHER): Payer: HMO | Admitting: Podiatry

## 2019-09-20 ENCOUNTER — Encounter: Payer: Self-pay | Admitting: Podiatry

## 2019-09-20 ENCOUNTER — Other Ambulatory Visit: Payer: Self-pay

## 2019-09-20 ENCOUNTER — Ambulatory Visit: Payer: HMO

## 2019-09-20 DIAGNOSIS — M79675 Pain in left toe(s): Secondary | ICD-10-CM

## 2019-09-20 DIAGNOSIS — E1159 Type 2 diabetes mellitus with other circulatory complications: Secondary | ICD-10-CM

## 2019-09-20 DIAGNOSIS — E1142 Type 2 diabetes mellitus with diabetic polyneuropathy: Secondary | ICD-10-CM

## 2019-09-20 DIAGNOSIS — Q828 Other specified congenital malformations of skin: Secondary | ICD-10-CM

## 2019-09-20 DIAGNOSIS — B351 Tinea unguium: Secondary | ICD-10-CM

## 2019-09-20 NOTE — Progress Notes (Signed)
This patient presents the officewith chief complaint of long thick painful nails on her left foot.  She states these nails are painful walking and wearing her shoes.  She says she is unable to self treat.  Patient returns to the office after not being seen for 1  year.  She presents the office today for an evaluation and treatment of her painful left toenails.  Patient has a history of a TMA right foot.  Patient also has painful callus on bottom of left foot. Patient presents in a wheelchair and is unable to get into treatment chair. General Appearance  Alert, conversant and in no acute stress.  Vascular  Dorsalis pedis and posterior tibial  pulses are palpable left  ...  Capillary return is within normal limits  Left. Temperature is within normal limits  Bleft.  Neurologic  Senn-Weinstein monofilament wire test diminished  left foot.. Muscle power within normal limits bilaterally.  Nails Thick disfigured discolored nails with subungual debris  from hallux to fifth toes left foot.. No evidence of bacterial infection or drainage bilaterally.  Orthopedic  No limitations of motion  feet .  No crepitus or effusions noted.  No bony pathology or digital deformities noted. TMA right foot.  Skin  normotropic skin noted bilaterally.  No signs of infections or ulcers noted.  . Porokeratotic lesion sub-fifth meta-base left foot.  Patient states there is no evidence of any pain or discomfort when walking.  No history of any drainage from the site.  Onychomycosis  Left foot   Porokeratosis left foot.  ROV.  Debride Nails  X 5.  Debride porokeratosis.  Patient was instructed to keep an eye on the skin lesion and if there are any changes to call the office and be seen.  Iatrogenic lesion left foot which was cauterized and then bandaged.   Patient may have a USTM  Left arch.   Keep an eye on this lesion.  RTC 3 months.   Gardiner Barefoot DPM

## 2019-09-21 NOTE — Telephone Encounter (Signed)
Patient returning call to [facility]. Requesting call back.

## 2019-09-21 NOTE — Telephone Encounter (Signed)
Left a message for a return call.

## 2019-09-24 ENCOUNTER — Other Ambulatory Visit: Payer: Self-pay | Admitting: Adult Health

## 2019-09-24 DIAGNOSIS — I10 Essential (primary) hypertension: Secondary | ICD-10-CM

## 2019-09-24 DIAGNOSIS — Z760 Encounter for issue of repeat prescription: Secondary | ICD-10-CM

## 2019-09-25 ENCOUNTER — Telehealth: Payer: Self-pay | Admitting: Adult Health

## 2019-09-25 NOTE — Telephone Encounter (Signed)
Pt called and stated that she is out of her tramadol and almost out of her gabapentin and would like to see if Tommi Rumps could give her a call back, so that she is able to get her medication due to her being in a lot of pain from radiation.  She stated that she is throwing the pills back up that is why she is running out of the medication.  Pt is aware that [facility] is not in the office today and will be returning back tomorrow and I will give them the msg.  Pt stated that she really needs to have a call back tomorrow.

## 2019-09-25 NOTE — Telephone Encounter (Signed)
Patient returning your call.

## 2019-09-26 ENCOUNTER — Other Ambulatory Visit: Payer: Self-pay | Admitting: Adult Health

## 2019-09-26 DIAGNOSIS — R112 Nausea with vomiting, unspecified: Secondary | ICD-10-CM

## 2019-09-26 DIAGNOSIS — M792 Neuralgia and neuritis, unspecified: Secondary | ICD-10-CM

## 2019-09-26 MED ORDER — TRAMADOL HCL 50 MG PO TABS: 50.0000 mg | ORAL_TABLET | Freq: Four times a day (QID) | ORAL | 0 refills | Status: AC

## 2019-09-26 MED ORDER — ONDANSETRON HCL 4 MG PO TABS: 4.0000 mg | ORAL_TABLET | Freq: Three times a day (TID) | ORAL | 1 refills | Status: DC | PRN

## 2019-09-26 MED ORDER — GABAPENTIN 400 MG PO CAPS: 400.0000 mg | ORAL_CAPSULE | Freq: Three times a day (TID) | ORAL | 0 refills | Status: AC

## 2019-09-26 NOTE — Telephone Encounter (Signed)
Pt returned call. Informed pt of Misty's message and she stated ok

## 2019-09-26 NOTE — Telephone Encounter (Signed)
See telephone note from 09/26/2019.  Nothing further needed.

## 2019-09-26 NOTE — Telephone Encounter (Signed)
Spoke to the pt and advised that her medication has been sent in along with a new rx for Zofran.  She was very happy to hear this.  Nothing further needed.

## 2019-09-26 NOTE — Telephone Encounter (Signed)
Medications renewed and sent to pharmacy.  Also sent in Zofran that she can take prior to taking this medication so she does not throw it up.

## 2019-09-26 NOTE — Telephone Encounter (Signed)
Pt stated she spoke to pharmacy and they had everything except gabapentin. Please advise.

## 2019-09-26 NOTE — Telephone Encounter (Signed)
Spoke to Beulah at the pharmacy.  Refill of gabapentin is early.  Cannot be filled until 10/01/2019.  Pharmacy can fill enough medication to get her to that date for $16.65 + tax.  Pt needs to be notified.  Left a message for a return call.

## 2019-09-27 NOTE — Progress Notes (Incomplete)
°  Patient Name: Tara Hopkins MRN: [id number] DOB: [birth year] Referring Physician: Curt Bears (Profile Not Attached) Date of Service: 09/19/2019 [facility], [place]                                                        End Of Treatment Note  Diagnoses: C34.11-Malignant neoplasm of upper lobe, right bronchus or lung  Cancer Staging: Limited stage (T1c, N2, M0) small cell lung cancer presented with right upper lobe lung nodule in addition to mediastinal lymphadenopathy  Intent: Curative  Radiation Treatment Dates: 07/27/2019 through 09/19/2019 Site Technique Total Dose (Gy) Dose per Fx (Gy) Completed Fx Beam Energies  Brain: Brain Complex 25/25 2.5 10/10 6X  Thorax: Lung_Rt 3D 60/60 2 30/30 6X, 10X, 15X   Narrative: The patient tolerated radiation therapy relatively well. She began to report vomiting and diarrhea during first and second week of treatment, which was controlled with Immodium and Compazine. Patient began to report scalp tenderness and hair loss on 08/15/2019. By 08/23/2019, patient had lost majority of hair on head. She also reported some ongoing moderate to severe fatigue. She had a slight dry cough towards the end of treatment. On 09/19/2019, she began to report some difficulty swallowing but had no further complications.  Plan: The patient will follow-up with radiation oncology in one month.  ________________________________________________  Blair Promise, PhD, MD  This document serves as a record of services personally performed by Gery Pray, MD. It was created on his behalf by Clerance Lav, a trained medical scribe. The creation of this record is based on the scribe's personal observations and the provider's statements to them. This document has been checked and approved by the attending  provider.

## 2019-10-04 ENCOUNTER — Ambulatory Visit: Payer: HMO | Admitting: Adult Health

## 2019-10-04 ENCOUNTER — Other Ambulatory Visit: Payer: Self-pay | Admitting: Adult Health

## 2019-10-04 DIAGNOSIS — Z760 Encounter for issue of repeat prescription: Secondary | ICD-10-CM

## 2019-10-18 ENCOUNTER — Encounter: Payer: Self-pay | Admitting: *Deleted

## 2019-10-23 ENCOUNTER — Ambulatory Visit
Admission: RE | Admit: 2019-10-23 | Discharge: 2019-10-23 | Disposition: A | Discharge status: Home / Self Care | Payer: HMO | Source: Ambulatory Visit | Attending: Radiation Oncology | Admitting: Radiation Oncology

## 2019-10-24 ENCOUNTER — Other Ambulatory Visit: Payer: Self-pay

## 2019-10-24 ENCOUNTER — Emergency Department (HOSPITAL_COMMUNITY)
Admission: EM | Admit: 2019-10-24 | Discharge: 2019-10-24 | Disposition: A | Discharge status: Home / Self Care | Payer: HMO | Attending: Emergency Medicine | Admitting: Emergency Medicine

## 2019-10-24 ENCOUNTER — Encounter (HOSPITAL_COMMUNITY): Payer: Self-pay | Admitting: *Deleted

## 2019-10-24 ENCOUNTER — Emergency Department (HOSPITAL_COMMUNITY): Payer: HMO

## 2019-10-24 DIAGNOSIS — L89620 Pressure ulcer of left heel, unstageable: Secondary | ICD-10-CM

## 2019-10-24 DIAGNOSIS — Z7982 Long term (current) use of aspirin: Secondary | ICD-10-CM | POA: Diagnosis not present

## 2019-10-24 DIAGNOSIS — L089 Local infection of the skin and subcutaneous tissue, unspecified: Secondary | ICD-10-CM

## 2019-10-24 DIAGNOSIS — X58XXXA Exposure to other specified factors, initial encounter: Secondary | ICD-10-CM | POA: Diagnosis not present

## 2019-10-24 DIAGNOSIS — Z87891 Personal history of nicotine dependence: Secondary | ICD-10-CM | POA: Diagnosis not present

## 2019-10-24 DIAGNOSIS — Y939 Activity, unspecified: Secondary | ICD-10-CM | POA: Diagnosis not present

## 2019-10-24 DIAGNOSIS — S90852A Superficial foreign body, left foot, initial encounter: Secondary | ICD-10-CM | POA: Diagnosis not present

## 2019-10-24 DIAGNOSIS — I10 Essential (primary) hypertension: Secondary | ICD-10-CM | POA: Diagnosis not present

## 2019-10-24 DIAGNOSIS — L89629 Pressure ulcer of left heel, unspecified stage: Secondary | ICD-10-CM | POA: Diagnosis not present

## 2019-10-24 DIAGNOSIS — Y999 Unspecified external cause status: Secondary | ICD-10-CM | POA: Diagnosis not present

## 2019-10-24 DIAGNOSIS — M795 Residual foreign body in soft tissue: Secondary | ICD-10-CM

## 2019-10-24 DIAGNOSIS — S91342A Puncture wound with foreign body, left foot, initial encounter: Secondary | ICD-10-CM | POA: Diagnosis not present

## 2019-10-24 DIAGNOSIS — Z794 Long term (current) use of insulin: Secondary | ICD-10-CM | POA: Diagnosis not present

## 2019-10-24 DIAGNOSIS — Z79899 Other long term (current) drug therapy: Secondary | ICD-10-CM | POA: Insufficient documentation

## 2019-10-24 DIAGNOSIS — Y929 Unspecified place or not applicable: Secondary | ICD-10-CM | POA: Insufficient documentation

## 2019-10-24 DIAGNOSIS — S99822A Other specified injuries of left foot, initial encounter: Secondary | ICD-10-CM | POA: Diagnosis present

## 2019-10-24 DIAGNOSIS — E1142 Type 2 diabetes mellitus with diabetic polyneuropathy: Secondary | ICD-10-CM | POA: Diagnosis not present

## 2019-10-24 DIAGNOSIS — M79671 Pain in right foot: Secondary | ICD-10-CM | POA: Diagnosis not present

## 2019-10-24 LAB — CBC WITH DIFFERENTIAL/PLATELET
Abs Immature Granulocytes: 0.02 10*3/uL (ref 0.00–0.07)
Basophils Absolute: 0 10*3/uL (ref 0.0–0.1)
Basophils Relative: 1 %
Eosinophils Absolute: 0.1 10*3/uL (ref 0.0–0.5)
Eosinophils Relative: 2 %
HCT: 41.4 % (ref 36.0–46.0)
Hemoglobin: 13.1 g/dL (ref 12.0–15.0)
Immature Granulocytes: 0 %
Lymphocytes Relative: 18 %
Lymphs Abs: 1 10*3/uL (ref 0.7–4.0)
MCH: 30.4 pg (ref 26.0–34.0)
MCHC: 31.6 g/dL (ref 30.0–36.0)
MCV: 96.1 fL (ref 80.0–100.0)
Monocytes Absolute: 0.4 10*3/uL (ref 0.1–1.0)
Monocytes Relative: 7 %
Neutro Abs: 4.1 10*3/uL (ref 1.7–7.7)
Neutrophils Relative %: 72 %
Platelets: 260 10*3/uL (ref 150–400)
RBC: 4.31 MIL/uL (ref 3.87–5.11)
RDW: 13.5 % (ref 11.5–15.5)
WBC: 5.6 10*3/uL (ref 4.0–10.5)
nRBC: 0 % (ref 0.0–0.2)

## 2019-10-24 LAB — BASIC METABOLIC PANEL
Anion gap: 10 (ref 5–15)
BUN: 19 mg/dL (ref 8–23)
CO2: 19 mmol/L — ABNORMAL LOW (ref 22–32)
Calcium: 8 mg/dL — ABNORMAL LOW (ref 8.9–10.3)
Chloride: 111 mmol/L (ref 98–111)
Creatinine, Ser: 1.34 mg/dL — ABNORMAL HIGH (ref 0.44–1.00)
GFR calc Af Amer: 47 mL/min — ABNORMAL LOW (ref >60)
GFR calc non Af Amer: 41 mL/min — ABNORMAL LOW (ref >60)
Glucose, Bld: 157 mg/dL — ABNORMAL HIGH (ref 70–99)
Potassium: 3.5 mmol/L (ref 3.5–5.1)
Sodium: 140 mmol/L (ref 135–145)

## 2019-10-24 MED ORDER — CIPROFLOXACIN HCL 500 MG PO TABS
500.0000 mg | ORAL_TABLET | Freq: Once | ORAL | Status: AC
  Administered 2019-10-24: 500 mg via ORAL
  Filled 2019-10-24: qty 1

## 2019-10-24 MED ORDER — DOXYCYCLINE HYCLATE 100 MG PO CAPS: 100.0000 mg | ORAL_CAPSULE | Freq: Two times a day (BID) | ORAL | 0 refills | Status: AC

## 2019-10-24 MED ORDER — DOXYCYCLINE HYCLATE 100 MG PO TABS
100.0000 mg | ORAL_TABLET | Freq: Once | ORAL | Status: AC
  Administered 2019-10-24: 100 mg via ORAL
  Filled 2019-10-24: qty 1

## 2019-10-24 MED ORDER — CIPROFLOXACIN HCL 500 MG PO TABS: 500.0000 mg | ORAL_TABLET | Freq: Two times a day (BID) | ORAL | 0 refills | Status: AC

## 2019-10-24 MED ORDER — SODIUM CHLORIDE 0.9 % IV BOLUS
500.0000 mL | Freq: Once | INTRAVENOUS | Status: AC
  Administered 2019-10-24: 500 mL via INTRAVENOUS

## 2019-10-24 NOTE — ED Notes (Signed)
Pt verbalizes understanding of DC instructions. Pt belongings returned and assisted in wheelchair out of ED.

## 2019-10-24 NOTE — ED Triage Notes (Signed)
Pt complains of right foot pain and swelling for the past couple days. Pt has hx of all toes being amputated on her right foot. Pt also hass left foot pain from an injury 6 months ago.

## 2019-10-24 NOTE — ED Provider Notes (Signed)
[facility] DEPT Provider Note   CSN: [id number] Arrival date & time: 10/24/19  1330     History Chief Complaint  Patient presents with  . Foot Pain    Tara Hopkins is a 68 y.o. female.  HPI    68 year old female with history of diabetes, right midfoot amputation comes in a chief complaint of pain in her left foot and pain at the site of her stump over the right foot.  She reports that she had the amputation 7 years ago.  Recently she started developing some discomfort at the stump site.  Additionally she has developed a callus-like lesion in the plantar surface of her left foot and has noted a wound to the left heel.  Patient is essentially bedbound.  Patient's sister reports that patient does not get the best care at home and is living by herself.  Review of system is negative for any nausea, vomiting, fevers, chills. [facility] is seeing [facility] and has seen the wound care clinic as well.  She prefers to follow-up with the same if possible.  Past Medical History:  Diagnosis Date  . Anemia   . Anxiety   . Bipolar 1 disorder ([place])   . Cataracts, bilateral   . Depression   . Diabetes mellitus without complication ([place])   . Glaucoma   . History of blood transfusion   . History of hyperbaric oxygen therapy    pt reports 80 treatments.  . Hypercholesteremia   . Hypertension   . Mediastinal adenopathy   . Peripheral neuropathy   . Pneumonia   . SCL ca Dx'd 12/2018  . Stroke ([place]) 02/2015  . Wears glasses     Patient Active Problem List   Diagnosis Date Noted  . Chemotherapy induced diarrhea 04/10/2019  . Encounter for antineoplastic chemotherapy 01/06/2019  . Goals of care, counseling/discussion 01/06/2019  . Small cell carcinoma of upper lobe of right lung ([place]) 12/29/2018  . Mediastinal adenopathy 12/22/2018  . Mass of right lung 11/10/2018  . Right hand pain 01/19/2018  . History of stroke 10/05/2017  . Former  smoker 10/05/2017  . Substance abuse ([place]) 10/05/2017  . Cerebrovascular accident (CVA) due to thrombosis of right middle cerebral artery ([place]) 07/23/2017  . Subcortical infarction ([place])   . Hyperlipidemia 07/22/2017  . Mild tetrahydrocannabinol (THC) abuse   . Smoker   . CVA (cerebral vascular accident) ([place]) 07/21/2017  . Ischemic stroke ([place]) 07/21/2017  . Diabetes mellitus with complication ([place]) 11/17/3233  . Slurred speech 07/21/2017  . Bipolar disorder ([place]) 09/09/2015  . Left hemiparesis ([place]) 02/20/2015  . Cocaine abuse ([place])   . Stroke ([place]) 02/15/2015  . Osteomyelitis of foot, right, acute ([place]) 02/23/2014  . Status post transmetatarsal amputation of right foot ([place]) 12/15/2013  . DM type 2 with diabetic peripheral neuropathy ([place]) 05/21/2013  . Essential hypertension 05/21/2013  . Depression 05/21/2013  . Anemia 05/21/2013  . Renal insufficiency 05/21/2013    Past Surgical History:  Procedure Laterality Date  . AMPUTATION Right 12/15/2013   Procedure: Right Transmetatarsal Amputation;  Surgeon: Newt Minion, MD;  Location: [facility];  Service: Orthopedics;  Laterality: Right;  . AMPUTATION Right 02/23/2014   Procedure: AMPUTATION FOOT;  Surgeon: Newt Minion, MD;  Location: [facility];  Service: Orthopedics;  Laterality: Right;  Right Foot Revision Transmetatarsal Amputation  . CATARACT EXTRACTION W/ INTRAOCULAR LENS  IMPLANT, BILATERAL    . COLONOSCOPY W/ BIOPSIES AND POLYPECTOMY    . I&D EXTREMITY  Right 05/23/2013   Procedure: IRRIGATION AND DEBRIDEMENT RIGHT GREAT TOE;  Surgeon: Mauri Pole, MD;  Location: [facility];  Service: Orthopedics;  Laterality: Right;  . TUBAL LIGATION    . VIDEO BRONCHOSCOPY WITH ENDOBRONCHIAL ULTRASOUND N/A 12/22/2018   Procedure: VIDEO BRONCHOSCOPY WITH ENDOBRONCHIAL ULTRASOUND;  Surgeon: [name], DO;  Location: [facility];  Service: Thoracic;  Laterality: N/A;     OB History   No obstetric history on file.     Family History  Problem  Relation Age of Onset  . Liver cancer Mother   . Lung cancer Mother        smoked  . Depression Mother   . Hypertension Father   . Depression Father   . Colon cancer Neg Hx     Social History   Tobacco Use  . Smoking status: Former Smoker    Packs/day: 0.50    Years: 40.00    Pack years: 20.00    Types: Cigarettes    Quit date: 10/17/2018    Years since quitting: 1.0  . Smokeless tobacco: Never Used  Substance Use Topics  . Alcohol use: Not Currently    Alcohol/week: 0.0 standard drinks  . Drug use: Yes    Types: Cocaine, Marijuana    Comment: last use of cocaine last use 12/09/2018    Home Medications Prior to Admission medications   Medication Sig Start Date End Date Taking? Authorizing Provider  amLODipine (NORVASC) 10 MG tablet Take 1 tablet (10 mg total) by mouth daily. 08/29/19  Yes Nafziger, Tommi Rumps, NP  aspirin 325 MG EC tablet Take 1 tablet (325 mg total) by mouth daily. 05/10/19  Yes Nafziger, Tommi Rumps, NP  atorvastatin (LIPITOR) 40 MG tablet TAKE 1 TABLET BY MOUTH AT 6 PM Patient taking differently: Take 40 mg by mouth daily at 6 PM.  09/12/19  Yes Nafziger, Tommi Rumps, NP  citalopram (CELEXA) 10 MG tablet Take 1 tablet (10 mg total) by mouth daily. 05/10/19 10/24/19 Yes Nafziger, Tommi Rumps, NP  clonazePAM (KLONOPIN) 0.5 MG tablet Take 1 tablet (0.5 mg total) by mouth 3 (three) times daily. 09/07/19  Yes Nafziger, Tommi Rumps, NP  gabapentin (NEURONTIN) 400 MG capsule Take 1 capsule (400 mg total) by mouth 3 (three) times daily. 09/26/19 12/25/19 Yes Nafziger, Tommi Rumps, NP  glimepiride (AMARYL) 4 MG tablet Take 1 tablet (4 mg total) by mouth daily with breakfast. Need office visit for further refills 10/10/19 11/09/19 Yes Nafziger, Tommi Rumps, NP  glucose blood (ONETOUCH VERIO) test strip USE TO TEST BLOOD GLUCOSE 3 TIMES DAILY 10/14/18  Yes Nafziger, Tommi Rumps, NP  Insulin Glargine (BASAGLAR KWIKPEN) 100 UNIT/ML SOPN Inject 0.4 mLs (40 Units total) into the skin at bedtime. 06/20/19 10/24/19 Yes Nafziger, Tommi Rumps, NP   Insulin Pen Needle (B-D UF III MINI PEN NEEDLES) 31G X 5 MM MISC Use as directed. 10/14/18  Yes Nafziger, Tommi Rumps, NP  latanoprost (XALATAN) 0.005 % ophthalmic solution INSTILL 1 DROP INTO BOTH EYES EVERY EVENING Patient taking differently: Place 1 drop into both eyes at bedtime.  05/10/19  Yes Nafziger, Tommi Rumps, NP  lisinopril (ZESTRIL) 40 MG tablet TAKE 1 TABLET BY MOUTH EVERY DAY Patient taking differently: Take 40 mg by mouth daily.  09/27/19  Yes Nafziger, Tommi Rumps, NP  ONE TOUCH LANCETS MISC USE TO CHECK BLOOD SUGAR 3 TIMES DAILY 09/23/17  Yes Nafziger, Tommi Rumps, NP  traMADol (ULTRAM) 50 MG tablet Take 1 tablet (50 mg total) by mouth 4 (four) times daily. Patient taking differently: Take 50 mg by mouth every 6 (  six) hours as needed for moderate pain.  09/26/19 10/26/19 Yes Nafziger, Tommi Rumps, NP  nicotine (NICODERM CQ) 14 mg/24hr patch Place 1 patch (14 mg total) onto the skin daily. Patient not taking: Reported on 10/24/2019 09/04/19   Curt Bears, MD    Allergies    Patient has no known allergies.  Review of Systems   Review of Systems  Constitutional: Positive for activity change.  Skin: Positive for wound.  Allergic/Immunologic: Positive for immunocompromised state.  Hematological: Does not bruise/bleed easily.  All other systems reviewed and are negative.   Physical Exam Updated Vital Signs BP 134/86   Pulse 86   Temp 99.1 F (37.3 C) (Oral)   Resp 16   SpO2 99%   Physical Exam Vitals and nursing note reviewed.  Constitutional:      Appearance: She is well-developed.  HENT:     Head: Normocephalic.  Cardiovascular:     Rate and Rhythm: Normal rate.     Pulses: Normal pulses.  Pulmonary:     Effort: Pulmonary effort is normal.  Abdominal:     General: Bowel sounds are normal.  Musculoskeletal:     Cervical back: Normal range of motion and neck supple.     Comments: Right foot distally has evidence of skin thinning at the stump site.  There is no fluctuance, erythema,  edema, drainage.  Over the left midfoot plantar surface there is a callus-like lesion.  There is no signs of infection around it and no tenderness to palpation.  Patient has unstageable pressure ulcer developing over the left heel  Skin:    General: Skin is warm and dry.  Neurological:     Mental Status: She is alert and oriented to person, place, and time.     ED Results / Procedures / Treatments   Labs (all labs ordered are listed, but only abnormal results are displayed) Labs Reviewed  BASIC METABOLIC PANEL - Abnormal; Notable for the following components:      Result Value   CO2 19 (*)    Glucose, Bld 157 (*)    Creatinine, Ser 1.34 (*)    Calcium 8.0 (*)    GFR calc non Af Amer 41 (*)    GFR calc Af Amer 47 (*)    All other components within normal limits  CBC WITH DIFFERENTIAL/PLATELET    EKG None  Radiology DG Foot Complete Left  Addendum Date: 10/24/2019   ADDENDUM REPORT: 10/24/2019 19:31 ADDENDUM: These results were called by telephone at the time of interpretation on 10/24/2019 at 7:31 pm to provider [facility] , who verbally acknowledged these results. Electronically Signed   By: Zetta Bills M.D.   On: 10/24/2019 19:31   Result Date: 10/24/2019 CLINICAL DATA:  Is heel pressure ulcer and midfoot plantar painful lesion. EXAM: LEFT FOOT - COMPLETE 3+ VIEW COMPARISON:  None FINDINGS: Is there is a broken needle within the soft tissues of the plantar forefoot along the midportion of the metatarsals. Is there is some soft tissue edema suggested in the underlying fat. No visible signs of osteomyelitis. Signs of vascular disease.  No signs of fracture. IMPRESSION: Needle in the plantar surface of the left foot about the second and third metatarsal, proximal portion. A call is out to the referring provider to further discuss findings in above case. Electronically Signed: By: Zetta Bills M.D. On: 10/24/2019 19:26   DG Foot Complete Right  Result Date:  10/24/2019 CLINICAL DATA:  Foot. Tenderness. EXAM: RIGHT FOOT COMPLETE -  3+ VIEW COMPARISON:  09/28/2013 FINDINGS: Since prior imaging there is been resection of the metatarsals. There is osteopenia with bony fusion of the midfoot. Soft tissue swelling is noted over the "stomach" . No overt signs of osteomyelitis. Ulceration is seen along the anterior surface of the stump on the lateral view. IMPRESSION: Post further amputation of the right foot when compared to prior imaging with signs of soft tissue infection about the stump and likely associated with ulceration. No plain film signs of osteomyelitis. Electronically Signed   By: Zetta Bills M.D.   On: 10/24/2019 19:29    Procedures Procedures (including critical care time)  Medications Ordered in ED Medications  ciprofloxacin (CIPRO) tablet 500 mg (has no administration in time range)  doxycycline (VIBRA-TABS) tablet 100 mg (has no administration in time range)  sodium chloride 0.9 % bolus 500 mL (500 mLs Intravenous New Bag/Given 10/24/19 2121)    ED Course  I have reviewed the triage vital signs and the nursing notes.  Pertinent labs & imaging results that were available during my care of the patient were reviewed by me and considered in my medical decision making (see chart for details).  Clinical Course as of Oct 23 2152  Tue Oct 24, 2019  2150 Left and right foot x-rays reviewed.  DG Foot Complete Right [AN]  2150 Results of the x-ray discussed with the patient.  DG Foot Complete Right [AN]    Clinical Course User Index [AN] Varney Biles, MD   MDM Rules/Calculators/A&P                      68 year old female comes in a chief complaint of foot evaluation.  Right foot is status post midfoot amputation and at the stump site there is some ulceration and possible evolving infection per x-rays.  There is no clear evidence of infection on our evaluation.  Patient is having tenderness to palpation.  There is clearly no abscess.   Suspicion for osteomyelitis clinically is low.  Additionally she has left foot pressure ulcer that is unstageable.  We will need her to follow-up with wound care for optimal management of it.  She has already established relationship with wound care clinic and last saw them 4 months ago.  She also has a needle in her plantar surface soft tissue that has been likely embedded there for a while.  There is no evidence of infection at this time.  We will start her on Cipro and doxy.   I discussed the case with the orthopedic surgeon.  Patient would prefer see the podiatrist and wound care clinic and Dr. Alvan Dame and I reviewed the case and discussed all the findings we have.  He is comfortable with patient following up with the podiatrist and wound care.  They can consult orthopedic surgery if they feel their expertise is needed.  We will provide appropriate follow-up. We will order face-to-face to provide more resources for home health.  The patient appears reasonably screened and/or stabilized for discharge and I doubt any other medical condition or other [facility] requiring further screening, evaluation, or treatment in the ED at this time prior to discharge.   Results from the ER workup discussed with the patient face to face and all questions answered to the best of my ability. The patient is safe for discharge with strict return precautions.   Final Clinical Impression(s) / ED Diagnoses Final diagnoses:  Foreign body (FB) in soft tissue  Pressure injury of left heel,  unstageable [facility])  Soft tissue infection    Rx / DC Orders ED Discharge Orders    None       Varney Biles, MD 10/24/19 2153

## 2019-10-24 NOTE — Discharge Instructions (Addendum)
You are seen in the ER for your foot complaint.  Our evaluation reveals that at the stump site you might have ulceration and soft tissue infection.  Antibiotics have been prescribed for it.  Follow-up with the [facility]iatrist at [facility].  Additionally in your left foot you have started developing a pressure ulcer in your heel.  We would like you to follow-up with the wound clinic for further care and management of the pressure ulcer site.  Finally, he also have foreign body embedded within your foot.  If you start developing any signs of redness, pain, pus drainage at the site please return to the ER immediately.  We hope that the antibiotics we have prescribed will help you with that as well.

## 2019-10-25 ENCOUNTER — Telehealth: Payer: Self-pay | Admitting: Adult Health

## 2019-10-25 NOTE — Telephone Encounter (Signed)
Patients daughter Colletta [place] called and would like to speak with Cory's CMA about getting a hospital bed for patient due to she is falling off bed and hurting herself. Please call daughter back, thanks.

## 2019-10-25 NOTE — Telephone Encounter (Signed)
No DPR on file for daughter.  Left a message on patient's # to call back.

## 2019-10-25 NOTE — Telephone Encounter (Signed)
Message Routed to PCP CMA 

## 2019-10-26 ENCOUNTER — Telehealth: Payer: Self-pay | Admitting: *Deleted

## 2019-10-26 NOTE — Telephone Encounter (Signed)
Spoke to the pt and her daughter (with pt permission).  They are asking for a hospital bed because the pt is weak.  Not able to stand well.  Have wounds to both feet.  Has upcoming appointment for wound treatment.  Pt also needs handicap placard.  Code?  Pt scheduled for CPX in March.  Will be moving to [place] with her daughter but will maintain her providers in [place].

## 2019-10-26 NOTE — Telephone Encounter (Signed)
See other telephone note.  

## 2019-10-26 NOTE — Telephone Encounter (Signed)
Copied from [place] [id number]. Topic: Appointment Scheduling - Scheduling Inquiry for Clinic >> Oct 26, 2019 11:53 AM Rayann Heman wrote: Reason for CRM: pt daughter called and stated that she would like an appointment as soon as possible because foot is not healing and will need a hospital bed. Please advise on if she can be worked in.

## 2019-10-26 NOTE — Telephone Encounter (Signed)
[place] for hospital bed and handicap parking sticker  DX: Failure to thrive, small cell carcinoma of upper lobe of right lung, chronic neuropathic pain of lower extremities

## 2019-10-27 ENCOUNTER — Encounter: Payer: Self-pay | Admitting: Family Medicine

## 2019-10-27 NOTE — Telephone Encounter (Signed)
Left a message for a return call.  Parking placard is available at the front desk. Order for hospital bed has been faxed to [facility] ([place]).  Pt needs to be notified.

## 2019-10-27 NOTE — Telephone Encounter (Signed)
Placard filled out and waiting on signature.  Order completed and waiting on signature.

## 2019-10-30 NOTE — Telephone Encounter (Signed)
Message Routed to PCP CMA 

## 2019-10-30 NOTE — Telephone Encounter (Signed)
Daria with [employer] called and said that she faxed a request for additional office notes to why patient needs hospital bed. She can be reached back if there are any questions.

## 2019-10-31 NOTE — Telephone Encounter (Signed)
More information needed for hospital bed.  Scheduled virtual visit through daughter.  She will assist with the virtual technology that is needed and also information needed for [facility].  Will forward to [facility] so he can see message from home health.  Nothing further needed.

## 2019-11-01 ENCOUNTER — Telehealth (INDEPENDENT_AMBULATORY_CARE_PROVIDER_SITE_OTHER): Payer: HMO | Admitting: Adult Health

## 2019-11-01 ENCOUNTER — Other Ambulatory Visit: Payer: Self-pay

## 2019-11-01 DIAGNOSIS — L89890 Pressure ulcer of other site, unstageable: Secondary | ICD-10-CM

## 2019-11-01 DIAGNOSIS — R296 Repeated falls: Secondary | ICD-10-CM

## 2019-11-01 DIAGNOSIS — F319 Bipolar disorder, unspecified: Secondary | ICD-10-CM

## 2019-11-01 DIAGNOSIS — L89619 Pressure ulcer of right heel, unspecified stage: Secondary | ICD-10-CM | POA: Diagnosis not present

## 2019-11-01 MED ORDER — QUETIAPINE FUMARATE 50 MG PO TABS: 50.0000 mg | ORAL_TABLET | Freq: Every day | ORAL | 1 refills | Status: AC

## 2019-11-01 NOTE — Progress Notes (Signed)
Virtual Visit via Video Note  I connected with Tara Hopkins on 11/01/19 at  4:00 PM EST by a video enabled telemedicine application and verified that I am speaking with the correct person using two identifiers.  Location patient: home Location provider:work or home office Persons participating in the virtual visit: patient, provider, daughter  I discussed the limitations of evaluation and management by telemedicine and the availability of in person appointments. The patient expressed understanding and agreed to proceed.   HPI: 68 year old female who is being evaluated today for the need of a hospital bed.  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  She reports that she is not doing well, and will be moving to [place] to live with her daughter so that she can get better care.  She was living alone.  Was recently seen in the emergency room for complaint of foot pain.  There was some ulceration and possible involvement of action per x-rays.  Additionally she had a pressure ulcer on the left foot that was unstageable.  He also had a needle that was embedded in her plantar surface of the soft tissue but there is no evidence of infection at this time.  She was started on Cipro and doxy with follow-up with wound care.  Her daughter reports that patient will be starting with hyperbaric therapy soon.  Her daughter reports that patient has been spending most of her time in bed, when she is not in bed she sitting in her chair.  She is often falling out of her bed at nighttime and is unable to get back in easily. Her daughter reports that the patient is very weak and has no strength, she can hardly walk more than a few steps any more.    She also continues to suffer from depression, currently taking Celexa 10 mg.  No longer feels as though this medication is helping.  She does have a  remote history of bipolar and feels as though she is currently in a depressive phase.  Her daughter reports that she often has "20 or 30 crying spells throughout the day".  Patient denies suicidal ideation.  She states " my body is just falling apart".  ROS: See pertinent positives and negatives per HPI.  Past Medical History:  Diagnosis Date  . Anemia   . Anxiety   . Bipolar 1 disorder ([place])   . Cataracts, bilateral   . Depression   . Diabetes mellitus without complication ([place])   . Glaucoma   . History of blood transfusion   . History of hyperbaric oxygen therapy    pt reports 80 treatments.  . Hypercholesteremia   . Hypertension   . Mediastinal adenopathy   . Peripheral neuropathy   . Pneumonia   . SCL ca Dx'd 12/2018  . Stroke ([place]) 02/2015  . Wears glasses     Past Surgical History:  Procedure Laterality Date  . AMPUTATION Right 12/15/2013   Procedure: Right Transmetatarsal Amputation;  Surgeon: Newt Minion, MD;  Location: [facility];  Service: Orthopedics;  Laterality: Right;  . AMPUTATION Right 02/23/2014   Procedure: AMPUTATION FOOT;  Surgeon: Newt Minion, MD;  Location: [facility];  Service: Orthopedics;  Laterality: Right;  Right Foot Revision Transmetatarsal Amputation  . CATARACT EXTRACTION W/ INTRAOCULAR LENS  IMPLANT, BILATERAL    . COLONOSCOPY W/ BIOPSIES AND POLYPECTOMY    . I & D  EXTREMITY Right 05/23/2013   Procedure: IRRIGATION AND DEBRIDEMENT RIGHT GREAT TOE;  Surgeon: Mauri Pole, MD;  Location: [facility];  Service: Orthopedics;  Laterality: Right;  . TUBAL LIGATION    . VIDEO BRONCHOSCOPY WITH ENDOBRONCHIAL ULTRASOUND N/A 12/22/2018   Procedure: VIDEO BRONCHOSCOPY WITH ENDOBRONCHIAL ULTRASOUND;  Surgeon: [name], DO;  Location: [facility];  Service: Thoracic;  Laterality: N/A;    Family History  Problem Relation Age of Onset  . Liver cancer Mother   . Lung cancer Mother        smoked  . Depression Mother   . Hypertension Father   . Depression Father   .  Colon cancer Neg Hx      Current Outpatient Medications:  .  amLODipine (NORVASC) 10 MG tablet, Take 1 tablet (10 mg total) by mouth daily., Disp: 90 tablet, Rfl: 0 .  aspirin 325 MG EC tablet, Take 1 tablet (325 mg total) by mouth daily., Disp: 90 tablet, Rfl: 0 .  atorvastatin (LIPITOR) 40 MG tablet, TAKE 1 TABLET BY MOUTH AT 6 PM (Patient taking differently: Take 40 mg by mouth daily at 6 PM. ), Disp: 90 tablet, Rfl: 0 .  ciprofloxacin (CIPRO) 500 MG tablet, Take 1 tablet (500 mg total) by mouth 2 (two) times daily., Disp: 14 tablet, Rfl: 0 .  citalopram (CELEXA) 10 MG tablet, Take 1 tablet (10 mg total) by mouth daily., Disp: 90 tablet, Rfl: 0 .  clonazePAM (KLONOPIN) 0.5 MG tablet, Take 1 tablet (0.5 mg total) by mouth 3 (three) times daily., Disp: 90 tablet, Rfl: 0 .  doxycycline (VIBRAMYCIN) 100 MG capsule, Take 1 capsule (100 mg total) by mouth 2 (two) times daily., Disp: 20 capsule, Rfl: 0 .  gabapentin (NEURONTIN) 400 MG capsule, Take 1 capsule (400 mg total) by mouth 3 (three) times daily., Disp: 270 capsule, Rfl: 0 .  glimepiride (AMARYL) 4 MG tablet, Take 1 tablet (4 mg total) by mouth daily with breakfast. Need office visit for further refills, Disp: 30 tablet, Rfl: 0 .  glucose blood (ONETOUCH VERIO) test strip, USE TO TEST BLOOD GLUCOSE 3 TIMES DAILY, Disp: 100 each, Rfl: 12 .  Insulin Glargine (BASAGLAR KWIKPEN) 100 UNIT/ML SOPN, Inject 0.4 mLs (40 Units total) into the skin at bedtime., Disp: 36 mL, Rfl: 0 .  Insulin Pen Needle (B-D UF III MINI PEN NEEDLES) 31G X 5 MM MISC, Use as directed., Disp: 100 each, Rfl: 3 .  latanoprost (XALATAN) 0.005 % ophthalmic solution, INSTILL 1 DROP INTO BOTH EYES EVERY EVENING (Patient taking differently: Place 1 drop into both eyes at bedtime. ), Disp: 7.5 mL, Rfl: 0 .  lisinopril (ZESTRIL) 40 MG tablet, TAKE 1 TABLET BY MOUTH EVERY DAY (Patient taking differently: Take 40 mg by mouth daily. ), Disp: 90 tablet, Rfl: 0 .  nicotine (NICODERM CQ)  14 mg/24hr patch, Place 1 patch (14 mg total) onto the skin daily. (Patient not taking: Reported on 10/24/2019), Disp: 28 patch, Rfl: 0 .  ONE TOUCH LANCETS MISC, USE TO CHECK BLOOD SUGAR 3 TIMES DAILY, Disp: 200 each, Rfl: 3  EXAM:  VITALS per patient if applicable:  GENERAL: alert, oriented, appears well and in no acute distress  HEENT: atraumatic, conjunttiva clear, no obvious abnormalities on inspection of external nose and ears  NECK: normal movements of the head and neck  LUNGS: on inspection no signs of respiratory distress, breathing rate appears normal, no obvious gross SOB, gasping or wheezing  CV: no obvious cyanosis  MS:  moves all visible extremities without noticeable abnormality  PSYCH/NEURO: pleasant and cooperative. Tearful throughout exam   ASSESSMENT AND PLAN:  Discussed the following assessment and plan:   Due to frequent falls from bed as well as multiple pressure ulcers on bilateral heels this patient requires frequent repositioning of the body in ways that cannot be achieved in an ordinary bed to alleviate pressure.  Hospital bed will also provide protection against frequent falls out of bed.  We will prescribe Seroquel and discontinue Celexa.  Advise follow-up in 3 to 4 weeks or sooner if needed virtual visit  - May need to consider Palliative care/hospice in the future, if the move to be closer to family does not provide benefit to the patient.      I discussed the assessment and treatment plan with the patient. The patient was provided an opportunity to ask questions and all were answered. The patient agreed with the plan and demonstrated an understanding of the instructions.   The patient was advised to call back or seek an in-person evaluation if the symptoms worsen or if the condition fails to improve as anticipated.   [name], NP

## 2019-11-10 ENCOUNTER — Inpatient Hospital Stay: Admit: 2019-11-10 | Payer: MEDICARE | Primary: Adult Health

## 2019-11-10 ENCOUNTER — Emergency Department: Admit: 2019-11-10 | Payer: MEDICARE | Primary: Adult Health

## 2019-11-10 ENCOUNTER — Inpatient Hospital Stay
Admit: 2019-11-10 | Discharge: 2019-11-21 | Disposition: A | Discharge status: Hospice | Payer: MEDICARE | Attending: Adolescent Medicine | Admitting: Adolescent Medicine

## 2019-11-10 DIAGNOSIS — R9082 White matter disease, unspecified: Secondary | ICD-10-CM | POA: Diagnosis not present

## 2019-11-10 DIAGNOSIS — J9690 Respiratory failure, unspecified, unspecified whether with hypoxia or hypercapnia: Secondary | ICD-10-CM | POA: Diagnosis not present

## 2019-11-10 DIAGNOSIS — I10 Essential (primary) hypertension: Secondary | ICD-10-CM | POA: Diagnosis not present

## 2019-11-10 DIAGNOSIS — R0603 Acute respiratory distress: Secondary | ICD-10-CM | POA: Diagnosis not present

## 2019-11-10 DIAGNOSIS — M50322 Other cervical disc degeneration at C5-C6 level: Secondary | ICD-10-CM | POA: Diagnosis not present

## 2019-11-10 DIAGNOSIS — R4182 Altered mental status, unspecified: Secondary | ICD-10-CM | POA: Diagnosis not present

## 2019-11-10 DIAGNOSIS — E119 Type 2 diabetes mellitus without complications: Secondary | ICD-10-CM | POA: Diagnosis not present

## 2019-11-10 DIAGNOSIS — R402122 Coma scale, eyes open, to pain, at arrival to emergency department: Secondary | ICD-10-CM | POA: Diagnosis not present

## 2019-11-10 DIAGNOSIS — E785 Hyperlipidemia, unspecified: Secondary | ICD-10-CM | POA: Diagnosis not present

## 2019-11-10 DIAGNOSIS — J9601 Acute respiratory failure with hypoxia: Secondary | ICD-10-CM | POA: Diagnosis not present

## 2019-11-10 DIAGNOSIS — R402212 Coma scale, best verbal response, none, at arrival to emergency department: Secondary | ICD-10-CM | POA: Diagnosis not present

## 2019-11-10 DIAGNOSIS — J984 Other disorders of lung: Secondary | ICD-10-CM | POA: Diagnosis not present

## 2019-11-10 DIAGNOSIS — R0902 Hypoxemia: Secondary | ICD-10-CM | POA: Diagnosis not present

## 2019-11-10 DIAGNOSIS — J9811 Atelectasis: Secondary | ICD-10-CM | POA: Diagnosis not present

## 2019-11-10 DIAGNOSIS — J690 Pneumonitis due to inhalation of food and vomit: Secondary | ICD-10-CM | POA: Diagnosis not present

## 2019-11-10 DIAGNOSIS — G9341 Metabolic encephalopathy: Secondary | ICD-10-CM | POA: Diagnosis not present

## 2019-11-10 DIAGNOSIS — Z9911 Dependence on respirator [ventilator] status: Secondary | ICD-10-CM | POA: Diagnosis not present

## 2019-11-10 DIAGNOSIS — G931 Anoxic brain damage, not elsewhere classified: Secondary | ICD-10-CM | POA: Diagnosis not present

## 2019-11-10 DIAGNOSIS — I517 Cardiomegaly: Secondary | ICD-10-CM | POA: Diagnosis not present

## 2019-11-10 DIAGNOSIS — Z743 Need for continuous supervision: Secondary | ICD-10-CM | POA: Diagnosis not present

## 2019-11-10 DIAGNOSIS — J9600 Acute respiratory failure, unspecified whether with hypoxia or hypercapnia: Secondary | ICD-10-CM | POA: Diagnosis not present

## 2019-11-10 DIAGNOSIS — R4020 Unspecified coma: Secondary | ICD-10-CM | POA: Diagnosis not present

## 2019-11-10 DIAGNOSIS — L97429 Non-pressure chronic ulcer of left heel and midfoot with unspecified severity: Secondary | ICD-10-CM | POA: Diagnosis not present

## 2019-11-10 DIAGNOSIS — R1011 Right upper quadrant pain: Secondary | ICD-10-CM | POA: Diagnosis not present

## 2019-11-10 DIAGNOSIS — R05 Cough: Secondary | ICD-10-CM | POA: Diagnosis not present

## 2019-11-10 DIAGNOSIS — E11649 Type 2 diabetes mellitus with hypoglycemia without coma: Secondary | ICD-10-CM | POA: Diagnosis not present

## 2019-11-10 DIAGNOSIS — I6523 Occlusion and stenosis of bilateral carotid arteries: Secondary | ICD-10-CM | POA: Diagnosis not present

## 2019-11-10 DIAGNOSIS — C3490 Malignant neoplasm of unspecified part of unspecified bronchus or lung: Secondary | ICD-10-CM | POA: Diagnosis not present

## 2019-11-10 DIAGNOSIS — R402312 Coma scale, best motor response, none, at arrival to emergency department: Secondary | ICD-10-CM | POA: Diagnosis not present

## 2019-11-10 DIAGNOSIS — R55 Syncope and collapse: Secondary | ICD-10-CM | POA: Diagnosis not present

## 2019-11-10 DIAGNOSIS — G9340 Encephalopathy, unspecified: Secondary | ICD-10-CM | POA: Diagnosis not present

## 2019-11-10 DIAGNOSIS — Z9981 Dependence on supplemental oxygen: Secondary | ICD-10-CM | POA: Diagnosis not present

## 2019-11-10 DIAGNOSIS — I469 Cardiac arrest, cause unspecified: Secondary | ICD-10-CM | POA: Diagnosis not present

## 2019-11-10 LAB — TSH 3RD GENERATION
TSH: 0.07 u[IU]/mL — ABNORMAL LOW (ref 0.36–3.74)
TSH: 0.07 u[IU]/mL — ABNORMAL LOW (ref 0.36–3.74)

## 2019-11-10 LAB — BLOOD GAS, ARTERIAL
BASE DEFICIT: 2.6 mmol/L — ABNORMAL HIGH (ref 0–2)
BICARBONATE: 22 mmol/L (ref 22–26)
Base Deficit: 2.6 mmol/L — ABNORMAL HIGH (ref 0–2)
EPAP/CPAP/PEEP: 5
EPAP/CPAP/PEEP: 5
FIO2: 100 %
FIO2: 100 %
HCO3: 22 mmol/L (ref 22–26)
O2 SAT: 100 % (ref >95)
O2 Sat: 100 % (ref >95)
PCO2: 30 mmHg — ABNORMAL LOW (ref 35–45)
PCO2: 30 mmHg — ABNORMAL LOW (ref 35–45)
PO2: 248 mmHg — ABNORMAL HIGH (ref 75–100)
PO2: 248 mmHg — ABNORMAL HIGH (ref 75–100)
SET RATE: 14
Set Rate: 14
Tidal Volume: 450
Tidal volume: 450
pH: 7.45 (ref 7.35–7.45)
pH: 7.45 (ref 7.35–7.45)

## 2019-11-10 LAB — CBC WITH AUTOMATED DIFF
ABS. BASOPHILS: 0 10*3/uL (ref 0.0–0.1)
ABS. EOSINOPHILS: 0 10*3/uL (ref 0.0–0.4)
ABS. IMM. GRANS.: 0 10*3/uL (ref 0.00–0.04)
ABS. LYMPHOCYTES: 0.5 10*3/uL — ABNORMAL LOW (ref 0.8–3.5)
ABS. MONOCYTES: 0.5 10*3/uL (ref 0.0–1.0)
ABS. NEUTROPHILS: 11 10*3/uL — ABNORMAL HIGH (ref 1.8–8.0)
BASOPHILS: 0 % (ref 0–1)
EOSINOPHILS: 0 % (ref 0–7)
HCT: 41.8 % (ref 35.0–47.0)
HGB: 13.6 g/dL (ref 11.5–16.0)
IMMATURE GRANULOCYTES: 0 % (ref 0.0–0.5)
LYMPHOCYTES: 4 % — ABNORMAL LOW (ref 12–49)
MCH: 30.9 PG (ref 26.0–34.0)
MCHC: 32.5 g/dL (ref 30.0–36.5)
MCV: 95 FL (ref 80.0–99.0)
MONOCYTES: 4 % — ABNORMAL LOW (ref 5–13)
MPV: 10.2 FL (ref 8.9–12.9)
NEUTROPHILS: 92 % — ABNORMAL HIGH (ref 32–75)
PLATELET: 305 10*3/uL (ref 150–400)
RBC: 4.4 M/uL (ref 3.80–5.20)
RDW: 14.4 % (ref 11.5–14.5)
WBC: 11.9 10*3/uL — ABNORMAL HIGH (ref 3.6–11.0)

## 2019-11-10 LAB — EKG, 12 LEAD, INITIAL
Atrial Rate: 129 {beats}/min
Calculated P Axis: 40 degrees
Calculated R Axis: 21 degrees
Calculated T Axis: 25 degrees
P-R Interval: 120 ms
Q-T Interval: 318 ms
QRS Duration: 126 ms
QTC Calculation (Bezet): 464 ms
Ventricular Rate: 128 {beats}/min

## 2019-11-10 LAB — METABOLIC PANEL, COMPREHENSIVE
A-G Ratio: 0.8 — ABNORMAL LOW (ref 1.1–2.2)
ALT (SGPT): 23 U/L (ref 12–78)
AST (SGOT): 38 U/L — ABNORMAL HIGH (ref 15–37)
Albumin: 3.8 g/dL (ref 3.5–5.0)
Alk. phosphatase: 109 U/L (ref 45–117)
Anion gap: 9 mmol/L (ref 5–15)
BUN/Creatinine ratio: 18 (ref 12–20)
BUN: 37 mg/dL — ABNORMAL HIGH (ref 6–20)
Bilirubin, total: 1 mg/dL (ref 0.2–1.0)
CO2: 22 mmol/L (ref 21–32)
Calcium: 10.2 mg/dL — ABNORMAL HIGH (ref 8.5–10.1)
Chloride: 116 mmol/L — ABNORMAL HIGH (ref 97–108)
Creatinine: 2.01 mg/dL — ABNORMAL HIGH (ref 0.55–1.02)
GFR est AA: 30 mL/min/{1.73_m2} — ABNORMAL LOW (ref >60)
GFR est non-AA: 25 mL/min/{1.73_m2} — ABNORMAL LOW (ref >60)
Globulin: 4.6 g/dL — ABNORMAL HIGH (ref 2.0–4.0)
Glucose: 146 mg/dL — ABNORMAL HIGH (ref 65–100)
Potassium: 4.1 mmol/L (ref 3.5–5.1)
Protein, total: 8.4 g/dL — ABNORMAL HIGH (ref 6.4–8.2)
Sodium: 147 mmol/L — ABNORMAL HIGH (ref 136–145)

## 2019-11-10 LAB — URINALYSIS W/ REFLEX CULTURE
BACTERIA, URINE: NEGATIVE /hpf
Bacteria: NEGATIVE /hpf
Bilirubin, Urine: NEGATIVE
Bilirubin: NEGATIVE
Glucose, Ur: 50 mg/dL — AB
Glucose: 50 mg/dL — AB
Ketone: 5 mg/dL — AB
Ketones, Urine: 5 mg/dL — AB
Leukocyte Esterase, Urine: NEGATIVE
Leukocyte Esterase: NEGATIVE
Nitrite, Urine: NEGATIVE
Nitrites: NEGATIVE
Protein, UA: 100 mg/dL — AB
Protein: 100 mg/dL — AB
Specific Gravity, UA: 1.014 (ref 1.003–1.030)
Specific gravity: 1.014 (ref 1.003–1.030)
Urobilinogen, UA, POCT: 0.1 EU/dL (ref 0.1–1.0)
Urobilinogen: 0.1 EU/dL (ref 0.1–1.0)
pH (UA): 5 (ref 5.0–8.0)
pH, UA: 5 (ref 5.0–8.0)

## 2019-11-10 LAB — LACTIC ACID
Lactic Acid: 2.7 mmol/L (ref 0.4–2.0)
Lactic acid: 2.7 mmol/L — CR (ref 0.4–2.0)

## 2019-11-10 LAB — PROCALCITONIN
Procalcitonin: <0.05 ng/mL — ABNORMAL HIGH
Procalcitonin: <0.05 ng/mL — ABNORMAL HIGH

## 2019-11-10 LAB — BNP: NT pro-BNP: 390 pg/mL — ABNORMAL HIGH (ref <125)

## 2019-11-10 LAB — PTT: aPTT: 29.4 s (ref 23.0–35.7)

## 2019-11-10 LAB — GLUCOSE, POC: Glucose (POC): 427 mg/dL — ABNORMAL HIGH (ref 65–100)

## 2019-11-10 LAB — MAGNESIUM
Magnesium: 2.2 mg/dL (ref 1.6–2.4)
Magnesium: 2.2 mg/dL (ref 1.6–2.4)

## 2019-11-10 LAB — D DIMER: D DIMER: 10.67 ug/ml(FEU) — ABNORMAL HIGH (ref <0.50)

## 2019-11-10 LAB — PROTHROMBIN TIME + INR
INR: 1.1 (ref 0.9–1.1)
Prothrombin time: 14 s (ref 11.9–14.7)

## 2019-11-10 LAB — TROPONIN I: Troponin-I, Qt.: <0.05 ng/mL (ref <0.05)

## 2019-11-10 LAB — COMPREHENSIVE METABOLIC PANEL
ALT: 23 U/L (ref 12–78)
AST: 38 U/L — ABNORMAL HIGH (ref 15–37)
Albumin/Globulin Ratio: 0.8 — ABNORMAL LOW (ref 1.1–2.2)
Albumin: 3.8 g/dL (ref 3.5–5.0)
Alkaline Phosphatase: 109 U/L (ref 45–117)
Anion Gap: 9 mmol/L (ref 5–15)
BUN: 37 mg/dL — ABNORMAL HIGH (ref 6–20)
Bun/Cre Ratio: 18 (ref 12–20)
CO2: 22 mmol/L (ref 21–32)
Calcium: 10.2 mg/dL — ABNORMAL HIGH (ref 8.5–10.1)
Chloride: 116 mmol/L — ABNORMAL HIGH (ref 97–108)
Creatinine: 2.01 mg/dL — ABNORMAL HIGH (ref 0.55–1.02)
EGFR IF NonAfrican American: 25 mL/min/{1.73_m2} — ABNORMAL LOW (ref >60)
GFR African American: 30 mL/min/{1.73_m2} — ABNORMAL LOW (ref >60)
Globulin: 4.6 g/dL — ABNORMAL HIGH (ref 2.0–4.0)
Glucose: 146 mg/dL — ABNORMAL HIGH (ref 65–100)
Potassium: 4.1 mmol/L (ref 3.5–5.1)
Sodium: 147 mmol/L — ABNORMAL HIGH (ref 136–145)
Total Bilirubin: 1 mg/dL (ref 0.2–1.0)
Total Protein: 8.4 g/dL — ABNORMAL HIGH (ref 6.4–8.2)

## 2019-11-10 LAB — APTT: aPTT: 29.4 s (ref 23.0–35.7)

## 2019-11-10 LAB — BRAIN NATRIURETIC PEPTIDE: BNP: 390 pg/mL — ABNORMAL HIGH (ref <125)

## 2019-11-10 LAB — POCT GLUCOSE: POC Glucose: 427 mg/dL — ABNORMAL HIGH (ref 65–100)

## 2019-11-10 LAB — CBC WITH AUTO DIFFERENTIAL
Basophils %: 0 % (ref 0–1)
Basophils Absolute: 0 10*3/uL (ref 0.0–0.1)
Eosinophils %: 0 % (ref 0–7)
Eosinophils Absolute: 0 10*3/uL (ref 0.0–0.4)
Granulocyte Absolute Count: 0 10*3/uL (ref 0.00–0.04)
Hematocrit: 41.8 % (ref 35.0–47.0)
Hemoglobin: 13.6 g/dL (ref 11.5–16.0)
Immature Granulocytes: 0 % (ref 0.0–0.5)
Lymphocytes %: 4 % — ABNORMAL LOW (ref 12–49)
Lymphocytes Absolute: 0.5 10*3/uL — ABNORMAL LOW (ref 0.8–3.5)
MCH: 30.9 PG (ref 26.0–34.0)
MCHC: 32.5 g/dL (ref 30.0–36.5)
MCV: 95 FL (ref 80.0–99.0)
MPV: 10.2 FL (ref 8.9–12.9)
Monocytes %: 4 % — ABNORMAL LOW (ref 5–13)
Monocytes Absolute: 0.5 10*3/uL (ref 0.0–1.0)
Neutrophils %: 92 % — ABNORMAL HIGH (ref 32–75)
Neutrophils Absolute: 11 10*3/uL — ABNORMAL HIGH (ref 1.8–8.0)
Platelets: 305 10*3/uL (ref 150–400)
RBC: 4.4 M/uL (ref 3.80–5.20)
RDW: 14.4 % (ref 11.5–14.5)
WBC: 11.9 10*3/uL — ABNORMAL HIGH (ref 3.6–11.0)

## 2019-11-10 LAB — EKG 12-LEAD
Atrial Rate: 129 {beats}/min
P Axis: 40 degrees
P-R Interval: 120 ms
Q-T Interval: 318 ms
QRS Duration: 126 ms
QTc Calculation (Bazett): 464 ms
R Axis: 21 degrees
T Axis: 25 degrees
Ventricular Rate: 128 {beats}/min

## 2019-11-10 LAB — PROTIME-INR
INR: 1.1 (ref 0.9–1.1)
Protime: 14 s (ref 11.9–14.7)

## 2019-11-10 LAB — D-DIMER, QUANTITATIVE: D-Dimer, Quant: 10.67 ug/ml(FEU) — ABNORMAL HIGH (ref <0.50)

## 2019-11-10 LAB — TROPONIN: Troponin I: <0.05 ng/mL (ref <0.05)

## 2019-11-10 MED ORDER — SODIUM CHLORIDE 0.9 % IJ SYRG
INTRAMUSCULAR | Status: DC | PRN
Start: 2019-11-10 — End: 2019-11-21
  Administered 2019-11-11: 07:00:00 via INTRAVENOUS

## 2019-11-10 MED ORDER — DEXTROSE 50% IN WATER (D50W) IV SYRG
INTRAVENOUS | Status: DC | PRN
Start: 2019-11-10 — End: 2019-11-16
  Administered 2019-11-11: 07:00:00 via INTRAVENOUS

## 2019-11-10 MED ORDER — ROCURONIUM 10 MG/ML IV
10 mg/mL | INTRAVENOUS | Status: AC
Start: 2019-11-10 — End: 2019-11-11

## 2019-11-10 MED ORDER — ONDANSETRON (PF) 4 MG/2 ML INJECTION
4 mg/2 mL | Freq: Four times a day (QID) | INTRAMUSCULAR | Status: DC | PRN
Start: 2019-11-10 — End: 2019-11-21
  Administered 2019-11-11: 05:00:00 via INTRAVENOUS

## 2019-11-10 MED ORDER — SUCCINYLCHOLINE CHLORIDE 20 MG/ML INJECTION
20 mg/mL | INTRAMUSCULAR | Status: AC
Start: 2019-11-10 — End: 2019-11-11

## 2019-11-10 MED ORDER — HEPARIN (PORCINE) 5,000 UNIT/ML IJ SOLN
5000 unit/mL | Freq: Three times a day (TID) | INTRAMUSCULAR | Status: DC
Start: 2019-11-10 — End: 2019-11-16
  Administered 2019-11-10 – 2019-11-16 (×18): via SUBCUTANEOUS

## 2019-11-10 MED ORDER — INSULIN LISPRO 100 UNIT/ML INJECTION
100 unit/mL | Freq: Four times a day (QID) | SUBCUTANEOUS | Status: DC
Start: 2019-11-10 — End: 2019-11-16
  Administered 2019-11-11 – 2019-11-16 (×20): via SUBCUTANEOUS

## 2019-11-10 MED ORDER — GLUCAGON 1 MG INJECTION
1 mg | INTRAMUSCULAR | Status: DC | PRN
Start: 2019-11-10 — End: 2019-11-16

## 2019-11-10 MED ORDER — LEVOFLOXACIN IN D5W 500 MG/100 ML IV PIGGY BACK
500 mg/100 mL | INTRAVENOUS | Status: DC
Start: 2019-11-10 — End: 2019-11-11
  Administered 2019-11-10: 22:00:00 via INTRAVENOUS

## 2019-11-10 MED ORDER — ACETAMINOPHEN 650 MG RECTAL SUPPOSITORY
650 mg | Freq: Four times a day (QID) | RECTAL | Status: DC | PRN
Start: 2019-11-10 — End: 2019-11-21

## 2019-11-10 MED ORDER — ETOMIDATE 2 MG/ML IV SOLN
2 mg/mL | INTRAVENOUS | Status: AC
Start: 2019-11-10 — End: 2019-11-11

## 2019-11-10 MED ORDER — SODIUM CHLORIDE 0.9% BOLUS IV
0.9 % | Freq: Once | INTRAVENOUS | Status: AC
Start: 2019-11-10 — End: 2019-11-10
  Administered 2019-11-10: 18:00:00 via INTRAVENOUS

## 2019-11-10 MED ORDER — ONDANSETRON 4 MG TAB, RAPID DISSOLVE
4 mg | Freq: Three times a day (TID) | ORAL | Status: DC | PRN
Start: 2019-11-10 — End: 2019-11-21

## 2019-11-10 MED ORDER — ETOMIDATE 2 MG/ML IV SOLN
2 mg/mL | INTRAVENOUS | Status: AC
Start: 2019-11-10 — End: 2019-11-10

## 2019-11-10 MED ORDER — SODIUM CHLORIDE 0.9 % IV PIGGY BACK
3.375 gram | INTRAVENOUS | Status: AC
Start: 2019-11-10 — End: 2019-11-10
  Administered 2019-11-10: 18:00:00 via INTRAVENOUS

## 2019-11-10 MED ORDER — ACETAMINOPHEN 325 MG TABLET
325 mg | Freq: Four times a day (QID) | ORAL | Status: DC | PRN
Start: 2019-11-10 — End: 2019-11-21

## 2019-11-10 MED ORDER — PROPOFOL INFUSION
10 mg/mL | INTRAVENOUS | Status: DC
Start: 2019-11-10 — End: 2019-11-16
  Administered 2019-11-11 – 2019-11-16 (×17): via INTRAVENOUS

## 2019-11-10 MED ORDER — POLYETHYLENE GLYCOL 3350 17 GRAM (100 %) ORAL POWDER PACKET
17 gram | Freq: Every day | ORAL | Status: DC | PRN
Start: 2019-11-10 — End: 2019-11-16

## 2019-11-10 MED ORDER — SODIUM CHLORIDE 0.9 % IV
INTRAVENOUS | Status: DC
Start: 2019-11-10 — End: 2019-11-11
  Administered 2019-11-10: 22:00:00 via INTRAVENOUS

## 2019-11-10 MED ORDER — METOPROLOL TARTRATE 5 MG/5 ML IV SOLN
5 mg/ mL | Freq: Four times a day (QID) | INTRAVENOUS | Status: DC | PRN
Start: 2019-11-10 — End: 2019-11-16
  Administered 2019-11-12: 19:00:00 via INTRAVENOUS

## 2019-11-10 MED ORDER — GLUCOSE 4 GRAM CHEWABLE TAB
4 gram | ORAL | Status: DC | PRN
Start: 2019-11-10 — End: 2019-11-16

## 2019-11-10 MED ORDER — SODIUM CHLORIDE 0.9 % IJ SYRG
Freq: Three times a day (TID) | INTRAMUSCULAR | Status: DC
Start: 2019-11-10 — End: 2019-11-21
  Administered 2019-11-10 – 2019-11-21 (×29): via INTRAVENOUS

## 2019-11-10 MED FILL — QUELICIN 20 MG/ML INJECTION SOLUTION: 20 mg/mL | INTRAMUSCULAR | Qty: 10

## 2019-11-10 MED FILL — ETOMIDATE 2 MG/ML IV SOLN: 2 mg/mL | INTRAVENOUS | Qty: 10

## 2019-11-10 MED FILL — SODIUM CHLORIDE 0.9 % IV: INTRAVENOUS | Qty: 1000

## 2019-11-10 MED FILL — HEPARIN (PORCINE) 5,000 UNIT/ML IJ SOLN: 5000 unit/mL | INTRAMUSCULAR | Qty: 1

## 2019-11-10 MED FILL — SODIUM CHLORIDE 0.9 % IJ SYRG: INTRAMUSCULAR | Qty: 40

## 2019-11-10 MED FILL — ROCURONIUM 10 MG/ML IV: 10 mg/mL | INTRAVENOUS | Qty: 5

## 2019-11-10 MED FILL — LEVOFLOXACIN IN D5W 500 MG/100 ML IV PIGGY BACK: 500 mg/100 mL | INTRAVENOUS | Qty: 100

## 2019-11-10 MED FILL — PIPERACILLIN-TAZOBACTAM 3.375 GRAM IV SOLR: 3.375 gram | INTRAVENOUS | Qty: 3.38

## 2019-11-10 NOTE — ED Notes (Signed)
Writer was about to take pt up to icu when patient started vomiting. Pt suctioned. Biting tube, moved left hand when iv flushed and blinks eyes when touched. Patient medicated with zofran and started on propofol infusion

## 2019-11-10 NOTE — ED Provider Notes (Addendum)
EMERGENCY DEPARTMENT HISTORY AND PHYSICAL EXAM      Date: 11/10/2019  Patient Name: Kristin Cooper    History of Presenting Illness     Chief Complaint   Patient presents with   ??? Unresponsive       History Provided By: EMS    HPI: Kristin Cooper, 69 y.o. female with a past medical history significant lung cancer presents to the ED with chief complaint of Unresponsive  .   Patient was found unresponsive by family today.  EMS found her glucose to be low.  Also pinpoint pupils only takes tramadol.  Narcan 8 mg total intranasal was given as well as D50 with a drip.  Her mental status has improved.  But she is not following any commands.  Unclear what her baseline is.  Patient however cannot provide any history.          There are no other complaints, changes, or physical findings at this time.    PCP: Shirline FreesNafziger, Cory, NP        Past History     Past Medical History:  Past Medical History:   Diagnosis Date   ??? Lung cancer [facility](HCC)        Past Surgical History:  No past surgical history on file.    Family History:  No family history on file.    Social History:  Social History     Tobacco Use   ??? Smoking status: Not on file   Substance Use Topics   ??? Alcohol use: Not on file   ??? Drug use: Not on file       Allergies:  No Known Allergies      Review of Systems   Review of Systems   Unable to perform ROS: Mental status change       Physical Exam   Physical Exam  Vitals signs and nursing note reviewed. Exam conducted with a chaperone present.   Constitutional:       General: She is in acute distress.      Appearance: She is normal weight.   HENT:      Head: Normocephalic and atraumatic.      Nose: Nose normal.      Mouth/Throat:      Mouth: Mucous membranes are moist.      Pharynx: Oropharynx is clear.   Eyes:      Extraocular Movements: Extraocular movements intact.      Conjunctiva/sclera: Conjunctivae normal.      Pupils: Pupils are equal, round, and reactive to light.   Neck:       Musculoskeletal: Normal range of motion and neck supple.   Cardiovascular:      Rate and Rhythm: Regular rhythm. Tachycardia present.      Pulses: Normal pulses.      Heart sounds: Normal heart sounds.   Pulmonary:      Effort: Respiratory distress present.      Breath sounds: Normal breath sounds.   Abdominal:      General: Abdomen is flat. Bowel sounds are normal. There is no distension.      Palpations: Abdomen is soft.      Tenderness: There is no abdominal tenderness. There is no guarding.   Musculoskeletal: Normal range of motion.         General: No swelling, tenderness, deformity or signs of injury.      Right lower leg: No edema.      Left lower leg: No edema.   Skin:  General: Skin is warm and dry.      Capillary Refill: Capillary refill takes less than 2 seconds.      Findings: No lesion or rash.   Neurological:      Cranial Nerves: No cranial nerve deficit.      Comments: unresponsive         Diagnostic Study Results     Labs -     No results found for this or any previous visit (from the past 12 hour(s)).    Radiologic Studies -   XR CHEST PORT   Final Result      XR CHEST PORT   Final Result   Impression: No significant interval change.         XR CHEST PORT   Final Result   IMPRESSION: ET tube tip in 3-4 cm above the carina. Gastric tube coiled within   the proximal stomach. Gaseous distention of stomach. Mild enlargement of   cardiopericardial silhouette, slightly accentuated by rightward patient   obliquity. Subtle left basilar airspace opacities, improved from previous,   consistent with resolving atelectasis and/or pneumonia. No pneumothorax.      XR CHEST PORT   Final Result   IMPRESSION: ET tube tip above the carina. A nasogastric tube is coiled within   the stomach. Subtle left basilar airspace disease, atelectasis versus   pneumonitis. Right lung relatively clear. No pneumothorax. Mild enlargement of   cardiopericardial silhouette.      MRI BRAIN W WO CONT   Final Result    IMPRESSION: Restricted diffusion in the posterior temporal frontal parietal and   occipital regions consistent with hypoxic ischemic encephalopathy. Sparing of   the basal ganglia, thalamus and posterior fossa. Differential diagnoses includes   PRES (posterior reversible encephalopathy syndrome). Repeat MRI in a week's time   is recommended.      Prominent microvascular ischemic changes as described above.      CT HEAD WO CONT   Final Result   IMPRESSION: Similar findings compared to CT head 11/12/2019.                  XR CHEST PORT   Final Result   Impression: Underexpanded lungs with bibasilar atelectasis.  No significant   interval change.         CTA HEAD   Final Result   IMPRESSION:   1.  No evidence of acute intracranial process on noncontrast CT head.   2.  No large vessel occlusion, hemodynamically significant luminal stenosis or   intracranial aneurysm.      XR CHEST PORT   Final Result   Impression:    No acute cardiopulmonary process.      NM LUNG SCAN PERF   Final Result   Impression:   Normal perfusion scintigraphy.         XR CHEST SNGL V   Final Result   IMPRESSION:   1.  The endotracheal tube tip overlies the tracheal airway approximately 37 mm   above the carina.   2.  The lungs are clear without acute pulmonary parenchymal pathology.        XR CHEST SNGL V   Final Result   IMPRESSION:   1.  The lung parenchyma is clear without acute pulmonary parenchymal pathology.   2.  There is milder ectasia of the thoracic aorta related to mild   atherosclerotic vascular changes.        CT HEAD WO CONT   Final Result  IMPRESSION:    1.  This examination is negative for acute intracranial pathology.   2.  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   3.  There is apparent proptosis more pronounced on the right compared to left.                CT SPINE CERV WO CONT   Final Result   Impression:     1.  This examination is negative for an acute fracture or a subluxation injury   to the cervical spine.   2.  There is moderate degenerative disc disease at the C5-C6 level.   3.  There is right-sided facet arthropathy at the C2-C3 and the C3-C4 levels.   There is narrowing of the right neural foramen at C3-C4 level. There is   left-sided facet arthropathy primarily at the C2-C3 C4-C5 levels. There is   narrowing of the left C4-C5 neural foramen.              CT Results  (Last 48 hours)    None        CXR Results  (Last 48 hours)    None          Medical Decision Making and ED Course   I am the first provider for this patient.    I reviewed the vital signs, available nursing notes, past medical history, past surgical history, family history and social history.    Vital Signs-Reviewed the patient's vital signs.  No data found.    EKG interpretation:   EKG at 931.  Tachycardia sinus at 128.  No ST changes.  T wave inversion present V1 only.  Otherwise normal intervals.  Right bundle branch block.  Reason rule out ACS.  Interpreted by ER physician.    Records Reviewed: Previous Hospital chart. EMS run report      ED Course:   Initial assessment performed. The patients presenting problems have been discussed, and they are in agreement with the care plan formulated and outlined with them.  I have encouraged them to ask questions as they arise throughout their visit.    Orders Placed This Encounter   ??? REASON FOR NOT SELECTING BASAL INSULIN     Standing Status:   Standing     Number of Occurrences:   1     Order Specific Question:   Reason for Not Selecting Basal Insulin     Answer:   Concern for hypoglycemia   ??? CULTURE, BLOOD, PAIRED     Standing Status:   Standing     Number of Occurrences:   1   ??? MRSA SCREEN - PCR (NASAL)     Standing Status:   Standing     Number of Occurrences:   1   ??? CULTURE, RESPIRATORY/SPUTUM/BRONCH W GRAM STAIN     Standing Status:   Standing     Number of Occurrences:   1    ??? XR CHEST SNGL V     Standing Status:   Standing     Number of Occurrences:   1     Order Specific Question:   Transport     Answer:   BED [2]     Order Specific Question:   Reason for Exam     Answer:   ams   ??? CT HEAD WO CONT     Standing Status:   Standing     Number of  Occurrences:   1     Order Specific Question:   Transport     Answer:   BED [2]     Order Specific Question:   Reason for Exam     Answer:   syncope   ??? CT SPINE CERV WO CONT     Standing Status:   Standing     Number of Occurrences:   1     Order Specific Question:   Transport     Answer:   BED [2]     Order Specific Question:   Reason for Exam     Answer:   syncope   ??? XR CHEST SNGL V     Standing Status:   Standing     Number of Occurrences:   1     Order Specific Question:   Transport     Answer:   BED [2]     Order Specific Question:   Reason for Exam     Answer:   intubation   ??? NM LUNG SCAN PERF     intubated elevated d dimer     Standing Status:   Standing     Number of Occurrences:   1     Order Specific Question:   Transport     Answer:   Doctor, general practice [5]     Order Specific Question:   Reason for Exam     Answer:   ams/unresponsive   ??? XR CHEST PORT     Standing Status:   Standing     Number of Occurrences:   1     Order Specific Question:   Reason for Exam     Answer:   Cough/PNA   ??? MRI BRAIN W WO CONT     Standing Status:   Standing     Number of Occurrences:   1     Order Specific Question:   Transport     Answer:   Doctor, general practice [5]     Order Specific Question:   Reason for Exam     Answer:   AMS, rule out stroke vs brain mets     Order Specific Question:   Does patient have history of Renal Disease?     Answer:   No   ??? CTA HEAD     Standing Status:   Standing     Number of Occurrences:   1     Order Specific Question:   Transport     Answer:   Doctor, general practice [5]     Order Specific Question:   Reason for Exam     Answer:   AMS, r/o stroke vs mets     Order Specific Question:   Does patient have history of Renal Disease?     Answer:   No    ??? XR CHEST PORT     Standing Status:   Standing     Number of Occurrences:   1     Order Specific Question:   Reason for Exam     Answer:   Respiratory failure   ??? CT HEAD WO CONT     Standing Status:   Standing     Number of Occurrences:   1     Order Specific Question:   Transport     Answer:   BED [2]     Order Specific Question:   Reason for Exam     Answer:   RUE extensor posturing   ??? XR CHEST PORT  Standing Status:   Standing     Number of Occurrences:   1     Order Specific Question:   Reason for Exam     Answer:   Respiratory failure   ??? XR CHEST PORT     Standing Status:   Standing     Number of Occurrences:   1     Order Specific Question:   Reason for Exam     Answer:   Respiratory failure   ??? CBC WITH AUTOMATED DIFF     Standing Status:   Standing     Number of Occurrences:   1   ??? METABOLIC PANEL, COMPREHENSIVE     Standing Status:   Standing     Number of Occurrences:   1   ??? TROPONIN I     Standing Status:   Standing     Number of Occurrences:   1   ??? BNP     Standing Status:   Standing     Number of Occurrences:   1   ??? PROTHROMBIN TIME + INR     Standing Status:   Standing     Number of Occurrences:   1   ??? PTT     Standing Status:   Standing     Number of Occurrences:   1   ??? URINALYSIS W/ REFLEX CULTURE     Standing Status:   Standing     Number of Occurrences:   1   ??? PROCALCITONIN     Standing Status:   Standing     Number of Occurrences:   1   ??? LACTIC ACID     Standing Status:   Standing     Number of Occurrences:   1   ??? TSH 3RD GENERATION     Standing Status:   Standing     Number of Occurrences:   1   ??? MAGNESIUM     Standing Status:   Standing     Number of Occurrences:   1   ??? D DIMER     Standing Status:   Standing     Number of Occurrences:   1   ??? BLOOD GAS, ARTERIAL     Standing Status:   Standing     Number of Occurrences:   1   ??? HEMOGLOBIN A1C WITH EAG     Standing Status:   Standing     Number of Occurrences:   1   ??? METABOLIC PANEL, BASIC     Standing Status:   Standing      Number of Occurrences:   1   ??? CBC WITH AUTOMATED DIFF     Standing Status:   Standing     Number of Occurrences:   1   ??? METABOLIC PANEL, BASIC     Standing Status:   Standing     Number of Occurrences:   1   ??? CBC W/O DIFF     Standing Status:   Standing     Number of Occurrences:   1   ??? METABOLIC PANEL, COMPREHENSIVE     Standing Status:   Standing     Number of Occurrences:   1   ??? MAGNESIUM     Standing Status:   Standing     Number of Occurrences:   1   ??? BLOOD GAS, ARTERIAL     Standing Status:   Standing     Number of Occurrences:   1   ???  BLOOD GAS, ARTERIAL     Standing Status:   Standing     Number of Occurrences:   1   ??? CBC W/O DIFF     Standing Status:   Standing     Number of Occurrences:   1   ??? METABOLIC PANEL, BASIC     Standing Status:   Standing     Number of Occurrences:   1   ??? MAGNESIUM     Standing Status:   Standing     Number of Occurrences:   1   ??? BLOOD GAS, ARTERIAL     Standing Status:   Standing     Number of Occurrences:   1   ??? BLOOD GAS, ARTERIAL     Standing Status:   Standing     Number of Occurrences:   1   ??? METABOLIC PANEL, BASIC     Standing Status:   Standing     Number of Occurrences:   1   ??? BLOOD GAS, ARTERIAL     Standing Status:   Standing     Number of Occurrences:   1   ??? BLOOD GAS, ARTERIAL     Standing Status:   Standing     Number of Occurrences:   1   ??? BLOOD GAS, ARTERIAL     Standing Status:   Standing     Number of Occurrences:   1   ??? NOTIFY PROVIDER: SPECIFY Notify provider on pt's arrival to floor ONE TIME STAT     Standing Status:   Standing     Number of Occurrences:   1     Order Specific Question:   Please describe the test or procedure you would like to order.     Answer:   Notify provider on pt's arrival to floor   ??? NURSING-MISCELLANEOUS: Initiate hypoglycemic protocol if blood glucose is LESS THAN 70 mg/dL CONTINUOUS     Standing Status:   Standing     Number of Occurrences:   1     Order Specific Question:   Description of Order:      Answer:   Initiate hypoglycemic protocol if blood glucose is LESS THAN 70 mg/dL   ??? NURSING-MISCELLANEOUS: FOR CONSCIOUS PATIENT: Administer 4 ounces fruit juice OR regular soda OR 4 glucose tablets. CONTINUOUS     Standing Status:   Standing     Number of Occurrences:   1     Order Specific Question:   Description of Order:     Answer:   FOR CONSCIOUS PATIENT: Administer 4 ounces fruit juice OR regular soda OR 4 glucose tablets.   ??? NURSING-MISCELLANEOUS: If patient NPO, unconscious or unable to swallow and If Blood Glucose between 60 and 70 mg/dL: Follow instructions below If patient NPO, unconscious or unable to swallow and if blood glucose between 60 and 70 mg/dL: Give 25 ...     If patient NPO, unconscious or unable to swallow and if blood glucose between 60 and 70 mg/dL: Give 25 mL U98% IV.  If blood glucose LESS THAN 60 mg/dL: Give 50 mL J19% IV.  If no IV, administer glucagon 1 mg IM.  Repeat blood glucose in 15 minutes.  Continue to repeat blood glucose and treatment every 15 minutes until blood glucose is GREATER THAN 70 mg/dL and notify provider.     Standing Status:   Standing     Number of Occurrences:   1     Order Specific Question:  Description of Order:     Answer:   If patient NPO, unconscious or unable to swallow and If Blood Glucose between 60 and 70 mg/dL: Follow instructions below   ??? NURSING-MISCELLANEOUS: Repeat finger stick blood glucose in 15 minutes AFTER treatment, if LESS THAN 70 repeat hypoglycemic protocol and notify provider. CONTINUOUS     Standing Status:   Standing     Number of Occurrences:   1     Order Specific Question:   Description of Order:     Answer:   Repeat finger stick blood glucose in 15 minutes AFTER treatment, if LESS THAN 70 repeat hypoglycemic protocol and notify provider.   ??? NURSING-MISCELLANEOUS: Following Hypoglycemic Protocol: Once patient is fully alert and BG greater than 70 provide a small snack,  if NPO consider IV fluids with dextrose. CONTINUOUS      Standing Status:   Standing     Number of Occurrences:   1     Order Specific Question:   Description of Order:     Answer:   Following Hypoglycemic Protocol: Once patient is fully alert and BG greater than 70 provide a small snack,  if NPO consider IV fluids with dextrose.   ??? NURSING-MISCELLANEOUS: Document all interventions in the Electronic Medical  Record (EMR). CONTINUOUS     Standing Status:   Standing     Number of Occurrences:   1     Order Specific Question:   Description of Order:     Answer:   Document all interventions in the Electronic Medical  Record (EMR).   ??? NURSING-MISCELLANEOUS: Blood glucose targets -- ICU: 140 - 180 mg/dL;  NON-ICU: 161 - 096 mg/dL CONTINUOUS     Standing Status:   Standing     Number of Occurrences:   1     Order Specific Question:   Description of Order:     Answer:   Blood glucose targets -- ICU: 140 - 180 mg/dL;  NON-ICU: 045 - 409 mg/dL   ??? NOTIFY PROVIDER: SPECIFY Notify physician for pulse less than 50 or greater than 120, respiratory rate less than 12 or greater than 25, oral temperature greater than 101.3 F (38.5 C), systolic BP less than 90 or greater than 180, diastolic BP less...     Notify physician for pulse less than 50 or greater than 120, respiratory rate less than 12 or greater than 25, oral temperature greater than 101.3 F (38.5 C), systolic BP less than 90 or greater than 180, diastolic BP less than 50 or greater than 100.     Standing Status:   Standing     Number of Occurrences:   1   ??? COMFORT MEASURES ONLY     Standing Status:   Standing     Number of Occurrences:   1   ??? NOTIFY PROVIDER: SPECIFY NOTIFY PROVIDER: FOR PAIN, DYSPNEA, AGITATION, OTHER DISTRESS OR NOT RESPONDING TO ORDERED INTERVENTIONS ONE TIME Routine     Standing Status:   Standing     Number of Occurrences:   1     Order Specific Question:   Please describe the test or procedure you would like to order.      Answer:   NOTIFY PROVIDER: FOR PAIN, DYSPNEA, AGITATION, OTHER DISTRESS OR NOT RESPONDING TO ORDERED INTERVENTIONS   ??? IP CONSULT TO NEUROLOGY     Standing Status:   Standing     Number of Occurrences:   1     Order Specific Question:  Reason for Consult:     Answer:   AMS     Order Specific Question:   Did you call or speak to the consulting provider?     Answer:   No     Order Specific Question:   Consult To     Answer:   AMS   ??? RT--MECHANICAL VENTILATOR     Standing Status:   Standing     Number of Occurrences:   1     Order Specific Question:   Mode:     Answer:   ASSIST CONTROL     Order Specific Question:   PEEP     Answer:   5.0 CM/H2O     Order Specific Question:   Rate:     Answer:   34     Order Specific Question:   Tidal Volume     Answer:   400     Order Specific Question:   FIO2     Answer:   21%/ROOM AIR   ??? RT--EXTUBATION OF AIRWAY     When transport home has been arranged and on their way to pick up patient     Standing Status:   Standing     Number of Occurrences:   1   ??? GLUCOSE, POC     Standing Status:   Standing     Number of Occurrences:   1   ??? GLUCOSE, POC     Standing Status:   Standing     Number of Occurrences:   1   ??? GLUCOSE, POC     Standing Status:   Standing     Number of Occurrences:   1   ??? GLUCOSE, POC     Standing Status:   Standing     Number of Occurrences:   1   ??? GLUCOSE, POC     Standing Status:   Standing     Number of Occurrences:   1   ??? GLUCOSE, POC     Standing Status:   Standing     Number of Occurrences:   1   ??? GLUCOSE, POC     Standing Status:   Standing     Number of Occurrences:   1   ??? GLUCOSE, POC     Standing Status:   Standing     Number of Occurrences:   1   ??? GLUCOSE, POC     Standing Status:   Standing     Number of Occurrences:   1   ??? GLUCOSE, POC     Standing Status:   Standing     Number of Occurrences:   1   ??? GLUCOSE, POC     Standing Status:   Standing     Number of Occurrences:   1   ??? GLUCOSE, POC     Standing Status:   Standing      Number of Occurrences:   1   ??? GLUCOSE, POC     Standing Status:   Standing     Number of Occurrences:   1   ??? GLUCOSE, POC     Standing Status:   Standing     Number of Occurrences:   1   ??? GLUCOSE, POC     Standing Status:   Standing     Number of Occurrences:   1   ??? GLUCOSE, POC     Standing Status:   Standing     Number of Occurrences:  1   ??? GLUCOSE, POC     Standing Status:   Standing     Number of Occurrences:   1   ??? GLUCOSE, POC     Standing Status:   Standing     Number of Occurrences:   1   ??? GLUCOSE, POC     Standing Status:   Standing     Number of Occurrences:   1   ??? GLUCOSE, POC     Standing Status:   Standing     Number of Occurrences:   1   ??? GLUCOSE, POC     Standing Status:   Standing     Number of Occurrences:   1   ??? GLUCOSE, POC     Standing Status:   Standing     Number of Occurrences:   1   ??? GLUCOSE, POC     Standing Status:   Standing     Number of Occurrences:   1   ??? GLUCOSE, POC     Standing Status:   Standing     Number of Occurrences:   1   ??? GLUCOSE, POC     Standing Status:   Standing     Number of Occurrences:   1   ??? GLUCOSE, POC     Standing Status:   Standing     Number of Occurrences:   1   ??? GLUCOSE, POC     Standing Status:   Standing     Number of Occurrences:   1   ??? GLUCOSE, POC     Standing Status:   Standing     Number of Occurrences:   1   ??? GLUCOSE, POC     Standing Status:   Standing     Number of Occurrences:   1   ??? GLUCOSE, POC     Standing Status:   Standing     Number of Occurrences:   1   ??? GLUCOSE, POC     Standing Status:   Standing     Number of Occurrences:   1   ??? GLUCOSE, POC     Standing Status:   Standing     Number of Occurrences:   1   ??? GLUCOSE, POC     Standing Status:   Standing     Number of Occurrences:   1   ??? GLUCOSE, POC     Standing Status:   Standing     Number of Occurrences:   1   ??? GLUCOSE, POC     Standing Status:   Standing     Number of Occurrences:   1   ??? GLUCOSE, POC     Standing Status:   Standing      Number of Occurrences:   1   ??? GLUCOSE, POC     Standing Status:   Standing     Number of Occurrences:   1   ??? GLUCOSE, POC     Standing Status:   Standing     Number of Occurrences:   1   ??? GLUCOSE, POC     Standing Status:   Standing     Number of Occurrences:   1   ??? GLUCOSE, POC     Standing Status:   Standing     Number of Occurrences:   1   ??? GLUCOSE, POC     Standing Status:   Standing     Number of Occurrences:  1   ??? GLUCOSE, POC     Standing Status:   Standing     Number of Occurrences:   1   ??? GLUCOSE, POC     Standing Status:   Standing     Number of Occurrences:   1   ??? GLUCOSE, POC     Standing Status:   Standing     Number of Occurrences:   1   ??? GLUCOSE, POC     Standing Status:   Standing     Number of Occurrences:   1   ??? GLUCOSE, POC     Standing Status:   Standing     Number of Occurrences:   1   ??? GLUCOSE, POC     Standing Status:   Standing     Number of Occurrences:   1   ??? GLUCOSE, POC     Standing Status:   Standing     Number of Occurrences:   1   ??? GLUCOSE, POC     Standing Status:   Standing     Number of Occurrences:   1   ??? GLUCOSE, POC     Standing Status:   Standing     Number of Occurrences:   1   ??? GLUCOSE, POC     Standing Status:   Standing     Number of Occurrences:   1   ??? GLUCOSE, POC     Standing Status:   Standing     Number of Occurrences:   1   ??? GLUCOSE, POC     Standing Status:   Standing     Number of Occurrences:   1   ??? GLUCOSE, POC     Standing Status:   Standing     Number of Occurrences:   1   ??? GLUCOSE, POC     Standing Status:   Standing     Number of Occurrences:   1   ??? GLUCOSE, POC     Standing Status:   Standing     Number of Occurrences:   1   ??? GLUCOSE, POC     Standing Status:   Standing     Number of Occurrences:   1   ??? GLUCOSE, POC     Standing Status:   Standing     Number of Occurrences:   1   ??? GLUCOSE, POC     Standing Status:   Standing     Number of Occurrences:   1   ??? GLUCOSE, POC     Standing Status:   Standing      Number of Occurrences:   1   ??? GLUCOSE, POC     Standing Status:   Standing     Number of Occurrences:   1   ??? GLUCOSE, POC     Standing Status:   Standing     Number of Occurrences:   1   ??? GLUCOSE, POC     Standing Status:   Standing     Number of Occurrences:   1   ??? GLUCOSE, POC     Standing Status:   Standing     Number of Occurrences:   1   ??? GLUCOSE, POC     Standing Status:   Standing     Number of Occurrences:   1   ??? GLUCOSE, POC     Standing Status:   Standing     Number of Occurrences:  1   ??? EKG, 12 LEAD, SUBSEQUENT     Standing Status:   Standing     Number of Occurrences:   1     Order Specific Question:   Reason for Exam:     Answer:   ST elevation   ??? EEG     Standing Status:   Standing     Number of Occurrences:   1     Order Specific Question:   Reason for Exam:     Answer:   seizure   ??? DISCHARGE PATIENT     Standing Status:   Standing     Number of Occurrences:   1   ??? FOAM FOOT DROP BOOTS     For bilateral heels: to be applied at all times daily while in the bed for off-loading of the heels and to prevent further skin breakdown.     Standing Status:   Standing     Number of Occurrences:   1   ??? SPECIALTY SUPPORT SURFACE     Total Care Sport with air while in ICU.     Standing Status:   Standing     Number of Occurrences:   1     Order Specific Question:   Specialty Support Surface:     Answer:   ICU Sport Bed   ??? SCANNED RADIOLOGY RESULTS   ??? sodium chloride 0.9 % bolus infusion 1,000 mL   ??? etomidate (AMIDATE) 2 mg/mL injection     Gryder, Christal: cabinet override   ??? DISCONTD: propofol (DIPRIVAN) 10 mg/mL infusion     Order Specific Question:   Titrate Infusion?     Answer:   Yes     Order Specific Question:   Initial Infusion Rate:     Answer:   20 mcg/kg/min     Order Specific Question:   Titrate Every 5 Minutes in Increments of:     Answer:   5 mcg/kg/min     Order Specific Question:   Goal RASS:     Answer:   0 to -1     Order Specific Question:   Contact Physician for:      Answer:   HR <50 bpm and/or SBP <90 mmHg     Order Specific Question:   Hold For:     Answer:   Spontaneous Breathing Trial     Order Specific Question:   Hold For:     Answer:   Oversedation   ??? piperacillin-tazobactam (ZOSYN) 3.375 g in 0.9% sodium chloride (MBP/ADV) 100 mL MBP     Order Specific Question:   Antibiotic Indications     Answer:   Pneumonia (CAP)   ??? DISCONTD: glucose chewable tablet 16 g   ??? DISCONTD: dextrose (D50W) injection syrg 12.5-25 g   ??? DISCONTD: glucagon (GLUCAGEN) injection 1 mg   ??? DISCONTD: insulin lispro (HUMALOG) injection   ??? DISCONTD: metoprolol (LOPRESSOR) injection 5 mg   ??? DISCONTD: sodium chloride (NS) flush 5-40 mL   ??? DISCONTD: sodium chloride (NS) flush 5-40 mL   ??? DISCONTD: acetaminophen (TYLENOL) tablet 650 mg   ??? DISCONTD: acetaminophen (TYLENOL) suppository 650 mg   ??? DISCONTD: polyethylene glycol (MIRALAX) packet 17 g   ??? DISCONTD: ondansetron (ZOFRAN ODT) tablet 4 mg   ??? DISCONTD: ondansetron (ZOFRAN) injection 4 mg   ??? DISCONTD: heparin (porcine) injection 5,000 Units   ??? DISCONTD: 0.9% sodium chloride infusion   ??? DISCONTD: levoFLOXacin (LEVAQUIN) 500 mg in D5W IVPB  Order Specific Question:   Antibiotic Indications     Answer:   Aspiration Pneumonia   ??? etomidate (AMIDATE) 2 mg/mL injection     Ivar Bury: cabinet override   ??? rocuronium 10 mg/mL injection     Ivar Bury: cabinet override   ??? succinylcholine (ANECTINE) 20 mg/mL injection     Ivar Bury: cabinet override   ??? succinylcholine (ANECTINE) 20 mg/mL injection     Ivar Bury: cabinet override   ??? technetium albumin aggregated (MAA) solution 4 millicurie   ??? propofoL (DIPRIVAN) 10 mg/mL injection     Leim Fabry: cabinet override   ??? DISCONTD: dextrose 5% lactated ringers infusion   ??? potassium chloride 10 mEq in 100 ml IVPB   ??? DISCONTD: meropenem (MERREM) 1 g in sterile water (preservative free) 20 mL IV syringe     Order Specific Question:   Antibiotic Indications      Answer:   Aspiration Pneumonia   ??? pantoprazole (PROTONIX) 40 mg in 0.9% sodium chloride 10 mL injection     Order Specific Question:   PPI INDICATION     Answer:   SUP Prophylaxis   ??? DISCONTD: chlorhexidine (PERIDEX) 0.12 % mouthwash 15 mL   ??? DISCONTD: levoFLOXacin (LEVAQUIN) 500 mg in D5W IVPB     Order Specific Question:   Antibiotic Indications     Answer:   Aspiration Pneumonia   ??? DISCONTD: albuterol-ipratropium (DUO-NEB) 2.5 MG-0.5 MG/3 ML     Order Specific Question:   MODE OF DELIVERY     Answer:   Nebulizer   ??? potassium chloride 10 mEq in 100 ml IVPB   ??? iopamidoL (ISOVUE-370) 76 % injection 100 mL   ??? DISCONTD: aspirin delayed-release tablet 81 mg   ??? DISCONTD: atorvastatin (LIPITOR) tablet 40 mg   ??? potassium chloride 10 mEq in 100 ml IVPB   ??? DISCONTD: insulin glargine (LANTUS) injection 5 Units   ??? DISCONTD: insulin lispro (HUMALOG) injection 3 Units   ??? gadobenate dimeglumine (MULTIHANCE) 529 mg/mL (0.33mmol/0.2mL) contrast injection 14 mL   ??? DISCONTD: dilTIAZem (CARDIZEM) 125 mg/125 mL (1 mg/mL) dextrose 5% infusion     Order Specific Question:   Titrate Infusion?     Answer:   No   ??? DISCONTD: LORazepam (ATIVAN) injection 1 mg   ??? DISCONTD: morphine injection 2 mg   ??? DISCONTD: glycopyrrolate (ROBINUL) injection 0.1 mg   ??? DISCONTD: insulin glargine (LANTUS) injection 5 Units   ??? DISCONTD: meropenem (MERREM) 1 g in sterile water (preservative free) 20 mL IV syringe     Order Specific Question:   Antibiotic Indications     Answer:   Aspiration Pneumonia   ??? DISCONTD: metoprolol (LOPRESSOR) injection 5 mg   ??? DISCONTD: propofol (DIPRIVAN) 10 mg/mL infusion     Order Specific Question:   Titrate Infusion?     Answer:   Yes     Order Specific Question:   Initial Infusion Rate:     Answer:   10 mcg/kg/min     Order Specific Question:   Titrate Every 5 Minutes in Increments of:     Answer:   5 mcg/kg/min     Order Specific Question:   Goal RASS:     Answer:   0 to -1      Order Specific Question:   Contact Physician for:     Answer:   HR <50 bpm and/or SBP <90 mmHg     Order Specific Question:   Hold For:  Answer:   Spontaneous Breathing Trial     Order Specific Question:   Hold For:     Answer:   Oversedation   ??? DISCONTD: insulin lispro (HUMALOG) injection   ??? DISCONTD: glucose chewable tablet 16 g   ??? DISCONTD: glucagon (GLUCAGEN) injection 1 mg   ??? DISCONTD: dextrose (D50W) injection syrg 12.5-25 g   ??? NOREPINephrine (LEVOPHED) 8 mg/250 mL (32 mcg/mL) in dextrose 5% 250 mL (32 mcg/mL) infusion     Turner, Elizabeth: cabinet override   ??? DISCONTD: NOREPINephrine (LEVOPHED) 8 mg in 5% dextrose (32 mcg/mL) infusion     Order Specific Question:   Titrate Infusion?     Answer:   Yes     Order Specific Question:   Initial Infusion Rate:     Answer:   4 mcg/min     Order Specific Question:   Titrate Every 5 Minutes In Increments of:     Answer:   1 mcg/min     Order Specific Question:   Goal of Therapy is:     Answer:   MAP greater than 65 mmHg     Order Specific Question:   Contact Physician for:     Answer:   Patient is not achieving goal and is at max rate   ??? morphine (ROXANOL) 100 mg/5 mL (20 mg/mL) concentrated solution     Sig: Take 0.5 mL by mouth every two (2) hours as needed for Pain for up to 3 days. Max Daily Amount: 120 mg.     Dispense:  30 mL     Refill:  0   ??? INITIAL PHYSICIAN ORDER: INPATIENT Intensive Care; 4. Patient requires ICU level of care interventions (further clarification in H&P documentation)     Standing Status:   Standing     Number of Occurrences:   1     Order Specific Question:   Status:     Answer:   INPATIENT [101]     Order Specific Question:   Type of Bed     Answer:   Intensive Care [6]     Order Specific Question:   Inpatient Hospitalization Certified Necessary for the Following Reasons     Answer:   4. Patient requires ICU level of care interventions (further clarification in H&P documentation)      Order Specific Question:   Admitting Diagnosis     Answer:   AMS (altered mental status) [8295621]     Order Specific Question:   Admitting Physician     Answer:   Alisia Ferrari [phone]     Order Specific Question:   Attending Physician     Answer:   Alisia Ferrari [phone]     Order Specific Question:   Estimated Length of Stay     Answer:   2 Midnights     Order Specific Question:   Discharge Plan:     Answer:   Home Health Care   ??? IP CONSULT TO CASE MANAGEMENT     Standing Status:   Standing     Number of Occurrences:   1     Order Specific Question:   Reasons for Consult:     Answer:   Assistance with Discharge Planning   ??? IP CONSULT TO WOUND CARE     Standing Status:   Standing     Number of Occurrences:   1     Order Specific Question:   Reason for Consult:  Answer:   left heal wound   ??? IP CONSULT TO NUTRITION SERVICES     Standing Status:   Standing     Number of Occurrences:   1     Order Specific Question:   Reason for Consult:     Answer:   Wounds   ??? IP CONSULT TO NUTRITION SERVICES     Standing Status:   Standing     Number of Occurrences:   1     Order Specific Question:   Reason for Consult:     Answer:   Tube Feedings Order and Management (Not available in [place])   ??? IP CONSULT TO ETHICS     Standing Status:   Standing     Number of Occurrences:   1     Order Specific Question:   Reason for Consult:     Answer:   Family dynamic issues   ??? IP CONSULT TO WOUND CARE     Standing Status:   Standing     Number of Occurrences:   1     Order Specific Question:   Reason for Consult:     Answer:   Left heel wound              CONSULTANTS:  Consults  kardar admit. D/w kristen biship    Provider Notes (Medical Decision Making):    69 year old female presents with episode of altered mental status unresponsive.  Initially hypoglycemia.  Concern for possible opiate overdose a slight improvement with Narcan for EMS.  Patient had episode of desatting altered mental status down to the 70s.  Patient is too altered to have BiPAP placed.  Persisted and patient was altered.  Suctioning did not improve her symptoms.  Patient was intubated.      Procedures                 CRITICAL CARE NOTE :  10:33 AM  Amount of Critical Care Time: 55 minutes    IMPENDING DETERIORATION -Airway, Respiratory, Cardiovascular and CNS  ASSOCIATED RISK FACTORS - Hypotension, Hypoxia and CNS Decompensation  MANAGEMENT- Bedside Assessment and Supervision of Care  INTERPRETATION -  Xrays, CT Scan and ECG  INTERVENTIONS - hemodynamic mngmt  CASE REVIEW - Hospitalist, Medical Sub-Specialist and Nursing  TREATMENT RESPONSE -Improved  PERFORMED BY - Self    NOTES   :  I have spent critical care time involved in lab review, consultations with specialist, family decision- making, bedside attention and documentation. This time excludes time spent in any separate billed procedures.  During this entire length of time I was immediately available to the patient .    Ander Gaster, MD    INTUBATION #1  Patient intubated secondary to respiratory failure.  Sats to 72.  Supplemental oxygen and bagged prior to intubation.  RSI with etomidate and succinylcholine.  A 7.5 ET tube.  Glide scope utilized.  Cords visualized.  Suction as there was copious material by the airway.  Tube passed without difficulty.  Patient tolerated procedure well.  Patient's O2 saturations were then 100% after being placed on the ventilator.  Good condensation in the tube.  Postintubation chest x-ray ordered.    INTUBATION #2   Patient self extubated.  Patient reintubated secondary to airway protection given copious secretions.  Glide scope utilized.  8 oh ET tube.  Cords visualized.  Etomidate and rocuronium utilized.  Tube passed without difficulty.  Tube secured again.  O2 saturation remained 100%.  Good condensation  in the tube.  Post reduction chest x-ray ordered.      Disposition       Emergency Department Disposition:  Admit ICUI.       Diagnosis     Clinical Impression:   1. Respiratory distress    2. Syncope and collapse    3. Hypoxia    aspiration pna    Attestations:    Forde Radon, MD    Please note that this dictation was completed with Dragon, the computer voice recognition software.  Quite often unanticipated grammatical, syntax, homophones, and other interpretive errors are inadvertently transcribed by the computer software.  Please disregard these errors.  Please excuse any errors that have escaped final proofreading.  Thank you.

## 2019-11-10 NOTE — ED Notes (Signed)
K. Bishop notified of pts d dimer result. Will discuss ct vs vq scan.    1658: nursing supervisor notified of need to still have MRI and vq scan

## 2019-11-10 NOTE — ED Notes (Signed)
[name] daughter updated on pts status and bed assignment.

## 2019-11-10 NOTE — Other (Signed)
..  TRANSFER - OUT REPORT:    Verbal report given to Charady on [employer]  being transferred to icu for routine progression of care       Report consisted of patient???s Situation, Background, Assessment and   Recommendations(SBAR).     Information from the following report(s) SBAR was reviewed with the receiving nurse.    Lines:   Peripheral IV 11/10/19 Anterior;Left External jugular (Active)       Peripheral IV 11/10/19 Left Hand (Active)   Site Assessment Clean, dry, & intact 11/10/19 1453   Phlebitis Assessment 0 11/10/19 1453   Infiltration Assessment 0 11/10/19 1453   Dressing Status Clean, dry, & intact 11/10/19 1453        Opportunity for questions and clarification was provided.      Patient transported with:   Monitor

## 2019-11-10 NOTE — Wound Image (Signed)
IP WOUND CONSULT    [facility] RECORD NUMBER:  [id number]  AGE: 69 y.o.   GENDER: female  DOB: [birth year]  TODAY'S DATE:  11/10/2019    GENERAL     []  Follow-up   [x]  New Consult    Kristin Cooper is a 69 y.o. female referred by:   [x]  Physician  []  Nursing  []  Other:         PAST MEDICAL HISTORY    Past Medical History:   Diagnosis Date   ??? Lung cancer ([place])         PAST SURGICAL HISTORY    No past surgical history on file.    FAMILY HISTORY    No family history on file.      ALLERGIES    No Known Allergies    MEDICATIONS    No current facility-administered medications on file prior to encounter.      Current Outpatient Medications on File Prior to Encounter   Medication Sig Dispense Refill   ??? atorvastatin (LIPITOR) 40 mg tablet Take 40 mg by mouth daily.     ??? glimepiride (AMARYL) 4 mg tablet Take 4 mg by mouth every morning.     ??? lisinopriL (PRINIVIL, ZESTRIL) 40 mg tablet Take 40 mg by mouth daily.     ??? amLODIPine (Norvasc) 10 mg tablet Take 10 mg by mouth daily.     ??? clonazePAM (KlonoPIN) 0.5 mg tablet Take 0.5 mg by mouth three (3) times daily.     ??? traMADoL (ULTRAM) 50 mg tablet Take 50 mg by mouth every six (6) hours as needed for Pain.     ??? insulin glargine (Lantus Solostar U-100 Insulin) 100 unit/mL (3 mL) inpn 40 Units by SubCUTAneous route nightly.           @OBJNOHEADERBEGIN @  Visit Vitals  BP 133/81   Pulse (!) 102   Temp 99.9 ??F (37.7 ??C)   Resp 14   Ht 5\' 1"  (1.549 m)   Wt 59 kg (130 lb)   SpO2 100%   BMI 24.56 kg/m??       ASSESSMENT     Wound Identification: sDTI to left heel   Dressing change: applied heel boots   Verbal consent for picture: Yes per daughter, Kristin Cooper    Contributing Factors: diabetes, poor glucose control, decreased mobility and shear force    Wound Heel Left blister to left heel, pt has hx of diabetes, per daughter (Active)   Wound Image    11/10/19 1520   Wound Etiology Deep Tissue/Injury 11/10/19 1520    Offloading for Diabetic Foot Ulcers Offloading boot 11/10/19 1520   Wound Length (cm) 9 cm 11/10/19 1520   Wound Width (cm) 9 cm 11/10/19 1520   Wound Surface Area (cm^2) 81 cm^2 11/10/19 1520   Wound Assessment Dry;Purple/maroon;Other (Comment) 11/10/19 1520   Drainage Amount None 11/10/19 1520   Wound Odor None 11/10/19 1520   Peri-Wound/Incision Assessment Dry/flaky;Intact 11/10/19 1520   Edges Undefined edges 11/10/19 1520   Wound Thickness Description Full thickness 11/10/19 1520   Number of days: 0          PLAN     Skin Care & Pressure Relief Recommendations  Speciality bed Waffle Overlay  Minimize layers of linen  Turn/reposition approximately every 2 hours  Pillow wedges  Offloading boots    Physician/Provider notified: Ardean Larsen PA  Recommendations: heel boots for offloading of heels.  Wound to left heel is currently intact so no  topical treatment warranted at this time.  Ensure that heel boots remain on while bed bound.  I applied heel boots before leaving the room.  BLEs are cold to the touch.  Dorsalis pedis pulse to left foot is thready.  Patient can be seen by vascular as outpatient per Bishop PA.  Patient's daughter, Kristin Cooper, would like to have patient treated outpatient with [facility] instead of going to [place] [place].  [name] confirmed that patient receives hyperbaric oxygen therapy treatment currently.  Will continue to follow.            Teaching completed with: off-loading for left heel is needed for healing as well as well-controlled blood glucose levels.  DTI may evolve and open requiring change in treatment plan.    []  Patient           [x]  Family member       [x]  Caregiver          []  Nursing  []  Other    Patient/Caregiver Teaching:  Level of patient/caregiver understanding able to:   []  Indicates understanding       []  Needs reinforcement  []  Unsuccessful      [x]  Verbal Understanding  []  Demonstrated understanding       []  No evidence of learning   []  Refused teaching         []  N/A       Electronically signed by , RN on 11/10/2019 at 3:26 PM

## 2019-11-10 NOTE — ED Notes (Signed)
Nursing supervisor notified of stat mri. Will do check list in computer

## 2019-11-10 NOTE — H&P (Addendum)
History and Physical    Patient: Kristin Cooper MRN: [id number]  SSN: [id number]    Date of Birth: 03/09/1951  Age: 69 y.o.  Sex: female      Subjective:      Kristin Cooper is a 69 y.o. female who presented to the ER by EMS on 11/10/2019 for being found unresponsive by family members.  Patient has a past medical history of diabetes insulin-dependent, CVA, lung cancer.  Patient is from [place], [place].  She moved up to this area to be with her daughter so she can care for her.  However they do go down to [place] for her doctor's appointments.  She is currently being treated with oral doxycycline for a left heel ulcer.  Per daughter patient has had several episodes of unresponsiveness in the last week.  She states these episodes she just stares and does not respond.  Yesterday she had an episode like this but she woke up and took her medicine and was able to eat.  Today she was unresponsive again and then had an episode of vomiting. EMS was called.  Per daughter her a family member started to do CPR.  When EMS arrived she had good vital signs and a pulse.  She was intubated in route and given Narcan to protect airway.  Of note patient had her last chemotherapy approximately 3 to 4 weeks ago for her lung cancer    In route patient's blood glucose level was 53.  Vital signs were stable.  She is currently intubated.  CT of the head was unremarkable.  Chest x-ray unremarkable however concern for possible aspiration pneumonia as she had a vomiting episode and became unresponsive.  She started on IV Zosyn.    Daughter at bedside and is the POA.  Her name is Kristin Cooper at number [phone]    Past Medical History:   Diagnosis Date   ??? Lung cancer ([place])      No past surgical history on file.   No family history on file.  Social History     Tobacco Use   ??? Smoking status: Not on file   Substance Use Topics   ??? Alcohol use: Not on file      Prior to Admission medications     Medication Sig Start Date End Date Taking? Authorizing Provider   atorvastatin (LIPITOR) 40 mg tablet Take 40 mg by mouth daily.   Yes Provider, Historical   glimepiride (AMARYL) 4 mg tablet Take 4 mg by mouth every morning.   Yes Provider, Historical   lisinopriL (PRINIVIL, ZESTRIL) 40 mg tablet Take 40 mg by mouth daily.   Yes Provider, Historical   amLODIPine (Norvasc) 10 mg tablet Take 10 mg by mouth daily.   Yes Provider, Historical   clonazePAM (KlonoPIN) 0.5 mg tablet Take 0.5 mg by mouth three (3) times daily.   Yes Provider, Historical   traMADoL (ULTRAM) 50 mg tablet Take 50 mg by mouth every six (6) hours as needed for Pain.   Yes Provider, Historical   insulin glargine (Lantus Solostar U-100 Insulin) 100 unit/mL (3 mL) inpn 40 Units by SubCUTAneous route nightly.   Yes Provider, Historical        No Known Allergies    Review of Systems:    Unable to assess as patient is intubated. Daughter at bedside and most information gathered thru her.       Objective:     Vitals:    11/10/19 0934 11/10/19 1031 11/10/19 1128  11/10/19 1242   BP: (!) 183/85   (!) 153/96   Pulse: (!) 131   (!) 114   Resp: _0 Temp:    98.6 ??F (37 ??C)   SpO2: 100% 100%  100%   Weight:   59 kg (130 lb)    Height:   5' 1" (1.549 m)         Physical Exam:  General: intubated  Eye: conjunctivae/corneas clear. PERRL, EOM's intact.   Throat and Neck: normal and no erythema or exudates noted. No mass   Lung: diminsihed  Heart: regular rate and rhythm,   Abdomen: soft, non-tender. Bowel sounds normal. No masses,  Extremities:  able to move all extremities normal, atraumatic  Skin: Normal.  Neurologic: intubate  Psychiatric: unable to assess     Recent Results (from the past 24 hour(s))   EKG, 12 LEAD, INITIAL    Collection Time: 11/10/19  9:31 AM   Result Value Ref Range    Ventricular Rate 128 BPM    Atrial Rate 129 BPM    P-R Interval 120 ms    QRS Duration 126 ms    Q-T Interval 318 ms    QTC Calculation (Bezet) 464 ms     Calculated P Axis 40 degrees    Calculated R Axis 21 degrees    Calculated T Axis 25 degrees    Diagnosis       Sinus tachycardia  Right bundle branch block  Abnormal ECG  No previous ECGs available  Confirmed by Marcello Moores, DEEPAK ([id number]) on 11/10/2019 12:27:21 PM     GLUCOSE, POC    Collection Time: 11/10/19  9:32 AM   Result Value Ref Range    Glucose (POC) 427 (H) 65 - 100 mg/dL    Performed by GRYDER CHRISTAL    CBC WITH AUTOMATED DIFF    Collection Time: 11/10/19 11:50 AM   Result Value Ref Range    WBC 11.9 (H) 3.6 - 11.0 K/uL    RBC 4.40 3.80 - 5.20 M/uL    HGB 13.6 11.5 - 16.0 g/dL    HCT 41.8 35.0 - 47.0 %    MCV 95.0 80.0 - 99.0 FL    MCH 30.9 26.0 - 34.0 PG    MCHC 32.5 30.0 - 36.5 g/dL    RDW 14.4 11.5 - 14.5 %    PLATELET 305 150 - 400 K/uL    MPV 10.2 8.9 - 12.9 FL    NEUTROPHILS 92 (H) 32 - 75 %    LYMPHOCYTES 4 (L) 12 - 49 %    MONOCYTES 4 (L) 5 - 13 %    EOSINOPHILS 0 0 - 7 %    BASOPHILS 0 0 - 1 %    IMMATURE GRANULOCYTES 0 0.0 - 0.5 %    ABS. NEUTROPHILS 11.0 (H) 1.8 - 8.0 K/UL    ABS. LYMPHOCYTES 0.5 (L) 0.8 - 3.5 K/UL    ABS. MONOCYTES 0.5 0.0 - 1.0 K/UL    ABS. EOSINOPHILS 0.0 0.0 - 0.4 K/UL    ABS. BASOPHILS 0.0 0.0 - 0.1 K/UL    ABS. IMM. GRANS. 0.0 0.00 - 0.04 K/UL    DF AUTOMATED     METABOLIC PANEL, COMPREHENSIVE    Collection Time: 11/10/19 11:50 AM   Result Value Ref Range    Sodium 147 (H) 136 - 145 mmol/L    Potassium 4.1 3.5 - 5.1 mmol/L    Chloride 116 (H) 97 - 108 mmol/L  CO2 22 21 - 32 mmol/L    Anion gap 9 5 - 15 mmol/L    Glucose 146 (H) 65 - 100 mg/dL    BUN 37 (H) 6 - 20 mg/dL    Creatinine 2.01 (H) 0.55 - 1.02 mg/dL    BUN/Creatinine ratio 18 12 - 20      GFR est AA 30 (L) >60 ml/min/1.10m    GFR est non-AA 25 (L) >60 ml/min/1.753m   Calcium 10.2 (H) 8.5 - 10.1 mg/dL    Bilirubin, total 1.0 0.2 - 1.0 mg/dL    AST (SGOT) 38 (H) 15 - 37 U/L    ALT (SGPT) 23 12 - 78 U/L    Alk. phosphatase 109 45 - 117 U/L    Protein, total 8.4 (H) 6.4 - 8.2 g/dL    Albumin 3.8 3.5 - 5.0 g/dL     Globulin 4.6 (H) 2.0 - 4.0 g/dL    A-G Ratio 0.8 (L) 1.1 - 2.2     TROPONIN I    Collection Time: 11/10/19 11:50 AM   Result Value Ref Range    Troponin-I, Qt. <0.05 <0.05 ng/mL   BNP    Collection Time: 11/10/19 11:50 AM   Result Value Ref Range    NT pro-BNP 390 (H) <125 pg/mL   LACTIC ACID    Collection Time: 11/10/19 11:50 AM   Result Value Ref Range    Lactic acid 2.7 (HH) 0.4 - 2.0 mmol/L   TSH 3RD GENERATION    Collection Time: 11/10/19 11:50 AM   Result Value Ref Range    TSH 0.07 (L) 0.36 - 3.74 uIU/mL   MAGNESIUM    Collection Time: 11/10/19 11:50 AM   Result Value Ref Range    Magnesium 2.2 1.6 - 2.4 mg/dL   URINALYSIS W/ REFLEX CULTURE    Collection Time: 11/10/19 12:44 PM    Specimen: Urine   Result Value Ref Range    Color Yellow/Straw      Appearance Clear Clear      Specific gravity 1.014 1.003 - 1.030      pH (UA) 5.0 5.0 - 8.0      Protein 100 (A) Negative mg/dL    Glucose 50 (A) Negative mg/dL    Ketone 5 (A) Negative mg/dL    Bilirubin Negative Negative      Blood Small (A) Negative      Urobilinogen 0.1 0.1 - 1.0 EU/dL    Nitrites Negative Negative      Leukocyte Esterase Negative Negative      UA:UC IF INDICATED Culture not indicated by UA result Culture not indicated by UA result      WBC 0-4 0 - 4 /hpf    RBC 0-5 0 - 5 /hpf    Bacteria Negative Negative /hpf   BLOOD GAS, ARTERIAL    Collection Time: 11/10/19 12:55 PM   Result Value Ref Range    pH 7.45 7.35 - 7.45      PCO2 30 (L) 35 - 45 mmHg    PO2 248 (H) 75 - 100 mmHg    O2 SAT 100 >95 %    BICARBONATE 22 22 - 26 mmol/L    BASE DEFICIT 2.6 (H) 0 - 2 mmol/L    O2 METHOD VENT      FIO2 100.0 %    MODE Assist Control/Volume Control      Tidal volume 450      SET RATE 14  EPAP/CPAP/PEEP 5.0      SITE Right Radial      ALLEN'S TEST PASS         XR Results (maximum last 3):  Results from [place] encounter on 11/10/19   XR CHEST SNGL V    Narrative Reason for Visit:   HISTORY: Intubation.        TECHNIQUE: Portable AP radiograph the chest dated 11/10/2019 obtained at 10:54 AM.  COMPARISON: 11/10/2019 obtained earlier in the morning.  LIMITATIONS: None.        TUBES/LINES:  The endotracheal tube tip overlies the trachea approximately 3 cm  above the carina.    HEART AND PULMONARY VESSELS: Stable without cardiac decompensation.    LUNG PARENCHYMA: Normal lung volumes. This examination is negative for airspace  disease.        PLEURA: Normal.      MEDIASTINUM: There is moderate ectasia of the thoracic aorta.     BONE/SOFT TISSUES: There are bridging paraspinal osteophytes along the mid to  lower thoracic spine related to diffuse idiopathic skeletal hyperostosis.          Impression IMPRESSION:  1.  The endotracheal tube tip overlies the tracheal airway approximately 37 mm  above the carina.  2.  The lungs are clear without acute pulmonary parenchymal pathology.     XR CHEST SNGL V    Narrative Reason for Visit:   HISTORY: Altered mental status       TECHNIQUE: Portable AP radiograph of the chest dated 11/10/2019 obtained at 9:46  AM.  COMPARISON: None.  LIMITATIONS: Overlying EKG leads.        TUBES/LINES:  None. There is an external cardiac defibrillator pad versus pacer  pad overlying the right hemithorax.    HEART AND PULMONARY VESSELS: There is a normal-sized cardiac silhouette. The  pulmonary vessels are normal in caliber.    LUNG PARENCHYMA: There is a shallow inspiratory effort. The lungs are clear  without airspace disease to suggest aspiration without significant atelectasis.         PLEURA: Normal.      MEDIASTINUM: There is milder ectasia of the thoracic aorta secondary to  atherosclerotic vascular changes.     BONE/SOFT TISSUES: There is right lateral spondylosis along the thoracic spine  consistent with milder changes of diffuse idiopathic skeletal hyperostosis.          Impression IMPRESSION:  1.  The lung parenchyma is clear without acute pulmonary parenchymal pathology.   2.  There is milder ectasia of the thoracic aorta related to mild  atherosclerotic vascular changes.         CT Results (maximum last 3):  Results from [place] encounter on 11/10/19   CT SPINE CERV WO CONT    Narrative The study is a CT examination of the cervical spine dated 11/10/2019.    HISTORY: Recent syncope.    Technique: Thin section axial imaging was acquired the cervical spine. From the  axial imaging sequence, sagittal and coronal reconstructed imaging is submitted  for interpretation.    Dose Reduction Technique was employed to reduce radiation exposure - This  includes reduction optimization techniques as appropriate to a performed exam  with automated exposure control adjustments of the mA and/or Kv according to  patient size, or use of iterative reconstruction technique.    COMPARISON: None.    Technique: Thin section axial imaging was acquired the cervical spine. From the  axial imaging sequence, sagittal and coronal reconstructed imaging is submitted  for interpretation.    Findings: The anterior and posterior arches to the C1 vertebra are intact. There  is an appropriate alignment of the lateral masses of C1 vertebra relative to the  odontoid process without a Jefferson burst fracture. The odontoid process images  in a normal fashion without an odontoid fracture.    There is an appropriate alignment of the articular masses and the facet joints  without a hyperflexion injury resulting in facet subluxation. There is a normal  height and morphology to the vertebral bodies without an axial loading  compression fracture or a simple wedge fracture. The spinous processes are  intact. This examination is negative for prevertebral soft tissue swelling.    There is moderate degenerative disc disease at the C5-C6 level. There is bony  hypertrophy of the uncovertebral joints with relative narrowing of the neural  foramina. There also is multilevel facet arthropathy along the upper cervical   spine. Imaging through the lung apices is negative for pneumothorax. There is a  nasal cannula tube in place to protect the patient's airway.      Impression Impression:   1.  This examination is negative for an acute fracture or a subluxation injury  to the cervical spine.  2.  There is moderate degenerative disc disease at the C5-C6 level.  3.  There is right-sided facet arthropathy at the C2-C3 and the C3-C4 levels.  There is narrowing of the right neural foramen at C3-C4 level. There is  left-sided facet arthropathy primarily at the C2-C3 C4-C5 levels. There is  narrowing of the left C4-C5 neural foramen.       CT HEAD WO CONT    Narrative The study is a noncontrasted CT examination of the head dated 11/10/2019.    HISTORY: Syncope.    TECHNIQUE: Thin section axial imaging was performed followed by sagittal and  coronal reconstructed imaging.    Dose Reduction Technique was employed to reduce radiation exposure - This  includes reduction optimization techniques as appropriate to a performed exam  with automated exposure control adjustments of the mA and/or Kv according to  patient size, or use of iterative reconstruction technique.    COMPARISON: None.    The ventricles are normal in size with a midline position. This examination is  negative for intracranial hemorrhage, mass effect or an extra axial collection.  The gray-white matter junctions are preserved without cytotoxic or vasogenic  edema.     An assessment of the white matter tracts demonstrates mild microvascular  changes. There are older lacunar infarcts demonstrated adjacent to the head of  the left caudate nucleus similar changes demonstrated along the anterior limb of  the right internal capsule.    ORBITS: The patient status post previous cataract surgery. There is proptosis of  the orbits more pronounced on the right compared to the left. 6 examination is  negative for an enlargement of the extraocular muscles or a retro-orbital mass.      PARANASAL SINUSES: This examination is negative for acute sinus disease.     The mastoid air cells and middle ear cavities image in a normal fashion. The  sella and the suprasellar regions are unremarkable. The craniocervical junction  images in a normal fashion.    OTHER: None.      Impression IMPRESSION:   1.  This examination is negative for acute intracranial pathology.  2.  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  3.  There is apparent proptosis more pronounced on the right compared to left.              Assessment:   1.  Altered mental status/unresponsive multifactorial -hypoglycemic vs. seizure vs. CVA  2.  Hypertension  3.  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  Hyperlipidemia  5.  Aspiration pneumonia  6.  Unstageable left heel ulcer    Plan:   1.  CT of the head was unremarkable.  We will get a stat MRI of the brain.  Consult neurology.  We will get a 2D echo and carotid. Get EEG.   2.  Blood pressure is elevated.  She takes lisinopril 40 mg daily and Norvasc 10 mg daily.  We will add on IV Lopressor to keep a blood pressure under 160 and heart rate under 110  3.  Patient's glucose level at EMS was 52.  She normally takes 40 units of Lantus.  Concern that patient's unresponsiveness may be related due to hypoglycemia.  We will check a hemoglobin A1c.  We will do insulin sliding scale  4.  Patient takes atorvastatin 40 mg daily.  On hold as patient is intubated  5.  Patient did have an episode of vomiting while she was unresponsive.  Concern for aspiration pneumonia.  We will keep on IV Zosyn and add levaquin   6.  Patient has a left heel ulcer that she has been treated with doxycycline as an outpatient setting.  She is currently on IV Zosyn.  Consult wound care    DVT prophylaxis Heparin  GI prophylaxis contraindicated at this time    Full code     POA Daughter Caryl Asp - spoke at bedside and updated on results and further plan. Number is [phone]    Critical Care Time: 67 min    Signed By: Ardean Larsen, PA-C     November 10, 2019

## 2019-11-10 NOTE — ED Triage Notes (Signed)
Per EMS< pt found unresp by family, ith accucheck 52. D10 hung by EMS, 8mg narcan given, pupils pinpoint at 2mm. No repsonse to verbal. +gag reflex, lots of yawning. Ulcer to ;eft heel, no toes rt foot, no skin breakdown on back or bottom, incont of urine. Repeat glucose 427 on arrival

## 2019-11-10 NOTE — ED Notes (Signed)
21:00-21:45 Patient sent to nuclear medicine for test accompanied by registered nurse and respiratory therapist.

## 2019-11-10 NOTE — ED Notes (Signed)
1008 to Ct with nurse and monitor, re: unresp.  1018 o2 sats 78% on room air, trying to suction orally and pts teeth clamped down. Dr petttis to bedside, spoke with pts daughter on the phone and is en route to hosiptal  1027 Etomidate 20mg and Succ 120mg given via EJ, for sedation and intubation,  1030 7.5 et tube  Inserted 22cm at lip . Good air exchange, color change. Resp pulled tube back to 20cm at lip. Sat 100%  1042 PCXR to bedside for post intubation, unable to verify tube. Dr Pettis to bedside, with use of glidescope, balloon inflated about vocal cords,   1044 pt extubated and reintubated with use of lgidescope, lots of secretions noted, etomidate 20mg and rocuronium 50mg iv given for intubation.   1045 7.5 et tube inserted 25cm at lip. Good air exchange, breath sounds, and PCXR at bedside to verify placement, placement verified by XR  sats 100% on vent.

## 2019-11-10 NOTE — ED Notes (Signed)
1008 to Ct with nurse and monitor, re: unresp.  1018 o2 sats 78% on room air, trying to suction orally and pts teeth clamped down. Dr petttis to bedside, spoke with pts daughter on the phone and is en route to hosiptal  1027 Etomidate 20mg  and Succ 120mg  given via EJ, for sedation and intubation,  1030 7.5 et tube  Inserted 22cm at lip . Good air exchange, color change. Resp pulled tube back to 20cm at lip. Sat 100%  1042 PCXR to bedside for post intubation, unable to verify tube. Dr Harlow Ohms to bedside, with use of glidescope, balloon inflated about vocal cords,   1044 pt extubated and reintubated with use of lgidescope, lots of secretions noted, etomidate 20mg  and rocuronium 50mg  iv given for intubation.   1045 7.5 et tube inserted 25cm at lip. Good air exchange, breath sounds, and PCXR at bedside to verify placement, placement verified by XR  sats 100% on vent.

## 2019-11-10 NOTE — ED Notes (Signed)
KAileen Pilot notified of pts d dimer result. Will discuss ct vs vq scan.    1658: nursing supervisor notified of need to still have MRI and vq scan

## 2019-11-10 NOTE — ED Notes (Signed)
Per EMS< pt found unresp by family, ith accucheck 45. D10 hung by EMS, 8mg  narcan given, pupils pinpoint at 47mm. No repsonse to verbal. +gag reflex, lots of yawning. Ulcer to ;eft heel, no toes rt foot, no skin breakdown on back or bottom, incont of urine. Repeat glucose 427 on arrival

## 2019-11-10 NOTE — ED Notes (Signed)
Nursing supervisor notified of stat mri. Will do check list in computer

## 2019-11-10 NOTE — ED Notes (Signed)
Writer was about to take pt up to icu when patient started vomiting. Pt suctioned. Biting tube, moved left hand when iv flushed and blinks eyes when touched. Patient medicated with zofran and started on propofol infusion

## 2019-11-10 NOTE — ED Notes (Signed)
[name] daughter updated on pts status and bed assignment.

## 2019-11-10 NOTE — ED Provider Notes (Signed)
ED Provider Notes by Forde Radon, MD at 11/10/19 [phone]                Author: Forde Radon, MD  Service: --  Author Type: Physician       Filed: 12/07/19 0922  Date of Service: 11/10/19 0936  Status: Addendum          Editor: Forde Radon, MD (Physician)          Related Notes: Original Note by Forde Radon, MD (Physician) filed at 11/10/19 1355               EMERGENCY DEPARTMENT HISTORY AND PHYSICAL EXAM           Date: 11/10/2019   Patient Name: Kristin Cooper        History of Presenting Illness          Chief Complaint       Patient presents with        ?  Unresponsive           History Provided By: EMS      HPI: Kristin Cooper , 69 y.o. female  with a past medical history significant lung cancer presents to the ED with chief complaint of  Unresponsive   .    Patient was found unresponsive by family today.  EMS found her glucose to be low.  Also pinpoint pupils only takes tramadol.  Narcan 8 mg total intranasal was given as well as D50 with a drip.  Her mental status has improved.  But she is not following  any commands.  Unclear what her baseline is.  Patient however cannot provide any history.               There are no other complaints, changes, or physical findings at this time.      PCP: Shirline Frees, NP              Past History        Past Medical History:     Past Medical History:        Diagnosis  Date         ?  Lung cancer [facility])             Past Surgical History:   No past surgical history on file.      Family History:   No family history on file.      Social History:     Social History          Tobacco Use         ?  Smoking status:  Not on file       Substance Use Topics         ?  Alcohol use:  Not on file         ?  Drug use:  Not on file           Allergies:   No Known Allergies           Review of Systems     Review of Systems    Unable to perform ROS: Mental status change             Physical Exam     Physical Exam   Vitals signs and nursing note reviewed. Exam  conducted  with a chaperone present.    Constitutional:        General: She is in acute distress.  Appearance: She is normal weight.   HENT :       Head: Normocephalic and atraumatic.      Nose: Nose normal.      Mouth/Throat:      Mouth: Mucous membranes are moist.      Pharynx: Oropharynx is clear.   Eyes :       Extraocular Movements: Extraocular movements intact.      Conjunctiva/sclera: Conjunctivae normal.      Pupils: Pupils are equal, round, and reactive to light.   Neck :       Musculoskeletal: Normal range of motion and neck supple.    Cardiovascular:       Rate and Rhythm: Regular rhythm. Tachycardia present.      Pulses: Normal pulses.      Heart sounds: Normal heart  sounds.    Pulmonary:       Effort: Respiratory distress present.      Breath sounds: Normal  breath sounds.   Abdominal :      General: Abdomen is flat. Bowel sounds are normal. There is no distension.      Palpations: Abdomen is soft.      Tenderness: There is no abdominal tenderness. There is no guarding.    Musculoskeletal: Normal range  of motion.          General: No swelling, tenderness, deformity or signs of injury.      Right lower leg: No edema.      Left lower leg: No edema.     Skin:      General: Skin is warm and dry.      Capillary Refill: Capillary refill takes less than 2 seconds.      Findings: No lesion or rash.   Neurological:       Cranial Nerves: No cranial nerve deficit.      Comments: unresponsive              Diagnostic Study Results        Labs -       No results found for this or any previous visit (from the past 12 hour(s)).      Radiologic Studies -      XR CHEST PORT       Final Result            XR CHEST PORT       Final Result     Impression: No significant interval change.                 XR CHEST PORT       Final Result     IMPRESSION: ET tube tip in 3-4 cm above the carina. Gastric tube coiled within     the proximal stomach. Gaseous distention of stomach. Mild enlargement of     cardiopericardial  silhouette, slightly accentuated by rightward patient     obliquity. Subtle left basilar airspace opacities, improved from previous,     consistent with resolving atelectasis and/or pneumonia. No pneumothorax.            XR CHEST PORT       Final Result     IMPRESSION: ET tube tip above the carina. A nasogastric tube is coiled within     the stomach. Subtle left basilar airspace disease, atelectasis versus     pneumonitis. Right lung relatively clear. No pneumothorax. Mild enlargement of     cardiopericardial silhouette.  MRI BRAIN W WO CONT       Final Result     IMPRESSION: Restricted diffusion in the posterior temporal frontal parietal and     occipital regions consistent with hypoxic ischemic encephalopathy. Sparing of     the basal ganglia, thalamus and posterior fossa. Differential diagnoses includes     PRES (posterior reversible encephalopathy syndrome). Repeat MRI in a week's time     is recommended.          Prominent microvascular ischemic changes as described above.            CT HEAD WO CONT       Final Result     IMPRESSION: Similar findings compared to CT head 11/12/2019.                                XR CHEST PORT       Final Result     Impression: Underexpanded lungs with bibasilar atelectasis.  No significant     interval change.                 CTA HEAD       Final Result     IMPRESSION:     1.  No evidence of acute intracranial process on noncontrast CT head.     2.  No large vessel occlusion, hemodynamically significant luminal stenosis or     intracranial aneurysm.            XR CHEST PORT       Final Result     Impression:      No acute cardiopulmonary process.            NM LUNG SCAN PERF       Final Result     Impression:     Normal perfusion scintigraphy.                 XR CHEST SNGL V       Final Result     IMPRESSION:     1.  The endotracheal tube tip overlies the tracheal airway approximately 37 mm     above the carina.     2.  The lungs are clear without acute pulmonary  parenchymal pathology.              XR CHEST SNGL V       Final Result     IMPRESSION:     1.  The lung parenchyma is clear without acute pulmonary parenchymal pathology.       2.  There is milder ectasia of the thoracic aorta related to mild       atherosclerotic vascular changes.              CT HEAD WO CONT       Final Result     IMPRESSION:      1.  This examination is negative for acute intracranial pathology.     2.  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     3.  There is apparent proptosis more pronounced on the right compared to left.                            CT SPINE CERV WO CONT  Final Result     Impression:      1.  This examination is negative for an acute fracture or a subluxation injury     to the cervical spine.     2.  There is moderate degenerative disc disease at the C5-C6 level.     3.  There is right-sided facet arthropathy at the C2-C3 and the C3-C4 levels.     There is narrowing of the right neural foramen at C3-C4 level. There is     left-sided facet arthropathy primarily at the C2-C3 C4-C5 levels. There is     narrowing of the left C4-C5 neural foramen.                           CT Results   (Last 48 hours)          None                 CXR Results   (Last 48 hours)          None                    Medical Decision Making and ED Course     I am the first provider for this patient.      I reviewed the vital signs, available nursing notes, past medical history, past surgical history, family history and social history.      Vital Signs-Reviewed the patient's vital signs.   No data found.      EKG interpretation:    EKG at 931.  Tachycardia sinus at 128.  No ST changes.  T wave inversion present V1 only.  Otherwise normal intervals.  Right bundle branch block.  Reason rule out ACS.  Interpreted by ER physician.      Records Reviewed: Previous Hospital chart. EMS run report         ED Course:    Initial assessment  performed. The patients presenting problems have been discussed, and they are in agreement with the care plan formulated and outlined with them.  I have encouraged them to ask questions as they arise throughout their visit.        Orders Placed This Encounter        ?  REASON FOR NOT SELECTING BASAL INSULIN              Standing Status:    Standing         Number of Occurrences:    1         Order Specific Question:    Reason for Not Selecting Basal Insulin         Answer:    Concern for hypoglycemia        ?  CULTURE, BLOOD, PAIRED              Standing Status:    Standing         Number of Occurrences:    1        ?  MRSA SCREEN - PCR (NASAL)              Standing Status:    Standing         Number of Occurrences:    1        ?  CULTURE, RESPIRATORY/SPUTUM/BRONCH W GRAM STAIN              Standing Status:    Standing  Number of Occurrences:    1        ?  XR CHEST SNGL V              Standing Status:    Standing         Number of Occurrences:    1         Order Specific Question:    Transport         Answer:    BED [2]         Order Specific Question:    Reason for Exam         Answer:    ams        ?  CT HEAD WO CONT              Standing Status:    Standing         Number of Occurrences:    1         Order Specific Question:    Transport         Answer:    BED [2]         Order Specific Question:    Reason for Exam         Answer:    syncope        ?  CT SPINE CERV WO CONT              Standing Status:    Standing         Number of Occurrences:    1         Order Specific Question:    Transport         Answer:    BED [2]         Order Specific Question:    Reason for Exam         Answer:    syncope        ?  XR CHEST SNGL V              Standing Status:    Standing         Number of Occurrences:    1         Order Specific Question:    Transport         Answer:    BED [2]         Order Specific Question:    Reason for Exam         Answer:    intubation        ?  NM LUNG SCAN PERF             intubated  elevated d dimer              Standing Status:    Standing         Number of Occurrences:    1         Order Specific Question:    Transport         Answer:    Biomedical scientist [5]         Order Specific Question:    Reason for Exam         Answer:    ams/unresponsive        ?  XR CHEST PORT              Standing Status:    Standing         Number of Occurrences:  1         Order Specific Question:    Reason for Exam         Answer:    Cough/PNA        ?  MRI BRAIN W WO CONT              Standing Status:    Standing         Number of Occurrences:    1         Order Specific Question:    Transport         Answer:    Doctor, general practice [5]         Order Specific Question:    Reason for Exam         Answer:    AMS, rule out stroke vs brain mets         Order Specific Question:    Does patient have history of Renal Disease?         Answer:    No        ?  CTA HEAD              Standing Status:    Standing         Number of Occurrences:    1         Order Specific Question:    Transport         Answer:    Doctor, general practice [5]         Order Specific Question:    Reason for Exam         Answer:    AMS, r/o stroke vs mets         Order Specific Question:    Does patient have history of Renal Disease?         Answer:    No        ?  XR CHEST PORT              Standing Status:    Standing         Number of Occurrences:    1         Order Specific Question:    Reason for Exam         Answer:    Respiratory failure        ?  CT HEAD WO CONT              Standing Status:    Standing         Number of Occurrences:    1         Order Specific Question:    Transport         Answer:    BED [2]         Order Specific Question:    Reason for Exam         Answer:    RUE extensor posturing        ?  XR CHEST PORT              Standing Status:    Standing         Number of Occurrences:    1         Order Specific Question:    Reason for Exam         Answer:    Respiratory failure        ?  XR CHEST PORT  Standing Status:    Standing          Number of Occurrences:    1         Order Specific Question:    Reason for Exam         Answer:    Respiratory failure        ?  CBC WITH AUTOMATED DIFF              Standing Status:    Standing         Number of Occurrences:    1        ?  METABOLIC PANEL, COMPREHENSIVE              Standing Status:    Standing         Number of Occurrences:    1        ?  TROPONIN I              Standing Status:    Standing         Number of Occurrences:    1        ?  BNP              Standing Status:    Standing         Number of Occurrences:    1        ?  PROTHROMBIN TIME + INR              Standing Status:    Standing         Number of Occurrences:    1        ?  PTT              Standing Status:    Standing         Number of Occurrences:    1        ?  URINALYSIS W/ REFLEX CULTURE              Standing Status:    Standing         Number of Occurrences:    1        ?  PROCALCITONIN              Standing Status:    Standing         Number of Occurrences:    1        ?  LACTIC ACID              Standing Status:    Standing         Number of Occurrences:    1        ?  TSH 3RD GENERATION              Standing Status:    Standing         Number of Occurrences:    1        ?  MAGNESIUM              Standing Status:    Standing         Number of Occurrences:    1        ?  D DIMER              Standing Status:    Standing         Number of Occurrences:    1        ?  BLOOD GAS, ARTERIAL              Standing Status:    Standing         Number of Occurrences:    1        ?  HEMOGLOBIN A1C WITH EAG              Standing Status:    Standing         Number of Occurrences:    1        ?  METABOLIC PANEL, BASIC              Standing Status:    Standing         Number of Occurrences:    1        ?  CBC WITH AUTOMATED DIFF              Standing Status:    Standing         Number of Occurrences:    1        ?  METABOLIC PANEL, BASIC              Standing Status:    Standing         Number of Occurrences:    1        ?  CBC W/O DIFF               Standing Status:    Standing         Number of Occurrences:    1        ?  METABOLIC PANEL, COMPREHENSIVE              Standing Status:    Standing         Number of Occurrences:    1        ?  MAGNESIUM              Standing Status:    Standing         Number of Occurrences:    1        ?  BLOOD GAS, ARTERIAL              Standing Status:    Standing         Number of Occurrences:    1        ?  BLOOD GAS, ARTERIAL              Standing Status:    Standing         Number of Occurrences:    1        ?  CBC W/O DIFF              Standing Status:    Standing         Number of Occurrences:    1        ?  METABOLIC PANEL, BASIC              Standing Status:    Standing         Number of Occurrences:    1        ?  MAGNESIUM              Standing Status:    Standing         Number of Occurrences:    1        ?  BLOOD GAS, ARTERIAL  Standing Status:    Standing         Number of Occurrences:    1        ?  BLOOD GAS, ARTERIAL              Standing Status:    Standing         Number of Occurrences:    1        ?  METABOLIC PANEL, BASIC              Standing Status:    Standing         Number of Occurrences:    1        ?  BLOOD GAS, ARTERIAL              Standing Status:    Standing         Number of Occurrences:    1        ?  BLOOD GAS, ARTERIAL              Standing Status:    Standing         Number of Occurrences:    1        ?  BLOOD GAS, ARTERIAL              Standing Status:    Standing         Number of Occurrences:    1        ?  NOTIFY PROVIDER: SPECIFY Notify provider on pt's arrival to floor ONE TIME STAT              Standing Status:    Standing         Number of Occurrences:    1         Order Specific Question:    Please describe the test or procedure you would like to order.         Answer:    Notify provider on pt's arrival to floor        ?  NURSING-MISCELLANEOUS: Initiate hypoglycemic protocol if blood glucose is LESS THAN 70 mg/dL CONTINUOUS              Standing Status:    Standing          Number of Occurrences:    1         Order Specific Question:    Description of Order:         Answer:    Initiate hypoglycemic protocol if blood glucose is LESS THAN 70 mg/dL        ?  NURSING-MISCELLANEOUS: FOR CONSCIOUS PATIENT: Administer 4 ounces fruit juice OR regular soda OR 4 glucose tablets. CONTINUOUS              Standing Status:    Standing         Number of Occurrences:    1         Order Specific Question:    Description of Order:              Answer:    FOR CONSCIOUS PATIENT: Administer 4 ounces fruit juice OR regular soda OR 4 glucose tablets.        ?  NURSING-MISCELLANEOUS: If patient NPO, unconscious or unable to swallow and If Blood Glucose between 60 and 70 mg/dL: Follow instructions  below If patient NPO, unconscious or unable to swallow and if blood glucose  between 60 and 70 mg/dL: Give 25 ...             If patient NPO, unconscious or unable to swallow and if blood glucose between 60 and 70 mg/dL: Give 25 mL Z61%50% IV.  If blood glucose  LESS THAN 60 mg/dL: Give 50 mL W96%50% IV.  If no IV, administer glucagon 1 mg IM.  Repeat blood glucose in 15 minutes.  Continue to repeat blood glucose and treatment every 15 minutes until blood glucose is GREATER THAN 70 mg/dL and notify provider.              Standing Status:    Standing         Number of Occurrences:    1         Order Specific Question:    Description of Order:         Answer:    If patient NPO, unconscious or unable to swallow and If Blood Glucose between 60 and 70 mg/dL: Follow instructions below        ?  NURSING-MISCELLANEOUS: Repeat finger stick blood glucose in 15 minutes AFTER treatment, if LESS THAN 70 repeat hypoglycemic protocol and  notify provider. CONTINUOUS              Standing Status:    Standing         Number of Occurrences:    1         Order Specific Question:    Description of Order:         Answer:    Repeat finger stick blood glucose in 15 minutes AFTER treatment, if LESS THAN 70 repeat hypoglycemic protocol and  notify provider.        ?  NURSING-MISCELLANEOUS: Following Hypoglycemic Protocol: Once patient is fully alert and BG greater than 70 provide a small snack,  if  NPO consider IV fluids with dextrose. CONTINUOUS              Standing Status:    Standing         Number of Occurrences:    1         Order Specific Question:    Description of Order:         Answer:    Following Hypoglycemic Protocol: Once patient is fully alert and BG greater than 70 provide a small snack,  if NPO consider IV fluids  with dextrose.        ?  NURSING-MISCELLANEOUS: Document all interventions in the Electronic Medical  Record (EMR). CONTINUOUS              Standing Status:    Standing         Number of Occurrences:    1         Order Specific Question:    Description of Order:         Answer:    Document all interventions in the Electronic Medical  Record (EMR).        ?  NURSING-MISCELLANEOUS: Blood glucose targets -- ICU: 140 - 180 mg/dL;  NON-ICU: 045100 - 409180 mg/dL CONTINUOUS              Standing Status:    Standing         Number of Occurrences:    1         Order Specific Question:    Description of Order:         Answer:  Blood glucose targets -- ICU: 140 - 180 mg/dL;  NON-ICU: 941 - 740 mg/dL        ?  NOTIFY PROVIDER: SPECIFY Notify physician for pulse less than 50 or greater than 120, respiratory rate less than 12 or greater than 25,  oral temperature greater than 101.3 F (38.5 C), systolic BP less than 90 or greater than 180, diastolic BP less...             Notify physician for pulse less than 50 or greater than 120, respiratory rate less than 12 or greater than 25, oral temperature greater  than 101.3 F (38.5 C), systolic BP less than 90 or greater than 180, diastolic BP less than 50 or greater than 100.              Standing Status:    Standing         Number of Occurrences:    1        ?  COMFORT MEASURES ONLY              Standing Status:    Standing         Number of Occurrences:    1        ?  NOTIFY PROVIDER: SPECIFY  NOTIFY PROVIDER: FOR PAIN, DYSPNEA, AGITATION, OTHER DISTRESS OR NOT RESPONDING TO ORDERED INTERVENTIONS ONE  TIME Routine              Standing Status:    Standing         Number of Occurrences:    1         Order Specific Question:    Please describe the test or procedure you would like to order.         Answer:    NOTIFY PROVIDER: FOR PAIN, DYSPNEA, AGITATION, OTHER DISTRESS OR NOT RESPONDING TO ORDERED INTERVENTIONS        ?  IP CONSULT TO NEUROLOGY              Standing Status:    Standing         Number of Occurrences:    1         Order Specific Question:    Reason for Consult:         Answer:    AMS         Order Specific Question:    Did you call or speak to the consulting provider?         Answer:    No         Order Specific Question:    Consult To         Answer:    AMS        ?  RT--MECHANICAL VENTILATOR              Standing Status:    Standing         Number of Occurrences:    1         Order Specific Question:    Mode:         Answer:    ASSIST CONTROL         Order Specific Question:    PEEP         Answer:    5.0 CM/H2O         Order Specific Question:    Rate:         Answer:    7  Order Specific Question:    Tidal Volume         Answer:    400         Order Specific Question:    FIO2         Answer:    21%/ROOM AIR        ?  RT--EXTUBATION OF AIRWAY             When transport home has been arranged and on their way to pick up patient              Standing Status:    Standing         Number of Occurrences:    1        ?  GLUCOSE, POC              Standing Status:    Standing         Number of Occurrences:    1        ?  GLUCOSE, POC              Standing Status:    Standing         Number of Occurrences:    1        ?  GLUCOSE, POC              Standing Status:    Standing         Number of Occurrences:    1        ?  GLUCOSE, POC              Standing Status:    Standing         Number of Occurrences:    1        ?  GLUCOSE, POC              Standing Status:    Standing         Number of  Occurrences:    1        ?  GLUCOSE, POC              Standing Status:    Standing         Number of Occurrences:    1        ?  GLUCOSE, POC              Standing Status:    Standing         Number of Occurrences:    1        ?  GLUCOSE, POC              Standing Status:    Standing         Number of Occurrences:    1        ?  GLUCOSE, POC              Standing Status:    Standing         Number of Occurrences:    1        ?  GLUCOSE, POC              Standing Status:    Standing         Number of Occurrences:    1        ?  GLUCOSE, POC              Standing Status:  Standing         Number of Occurrences:    1        ?  GLUCOSE, POC              Standing Status:    Standing         Number of Occurrences:    1        ?  GLUCOSE, POC              Standing Status:    Standing         Number of Occurrences:    1        ?  GLUCOSE, POC              Standing Status:    Standing         Number of Occurrences:    1        ?  GLUCOSE, POC              Standing Status:    Standing         Number of Occurrences:    1        ?  GLUCOSE, POC              Standing Status:    Standing         Number of Occurrences:    1        ?  GLUCOSE, POC              Standing Status:    Standing         Number of Occurrences:    1        ?  GLUCOSE, POC              Standing Status:    Standing         Number of Occurrences:    1        ?  GLUCOSE, POC              Standing Status:    Standing         Number of Occurrences:    1        ?  GLUCOSE, POC              Standing Status:    Standing         Number of Occurrences:    1        ?  GLUCOSE, POC              Standing Status:    Standing         Number of Occurrences:    1        ?  GLUCOSE, POC              Standing Status:    Standing         Number of Occurrences:    1        ?  GLUCOSE, POC              Standing Status:    Standing         Number of Occurrences:    1        ?  GLUCOSE, POC              Standing Status:    Standing         Number of Occurrences:  1        ?   GLUCOSE, POC              Standing Status:    Standing         Number of Occurrences:    1        ?  GLUCOSE, POC              Standing Status:    Standing         Number of Occurrences:    1        ?  GLUCOSE, POC              Standing Status:    Standing         Number of Occurrences:    1        ?  GLUCOSE, POC              Standing Status:    Standing         Number of Occurrences:    1        ?  GLUCOSE, POC              Standing Status:    Standing         Number of Occurrences:    1        ?  GLUCOSE, POC              Standing Status:    Standing         Number of Occurrences:    1        ?  GLUCOSE, POC              Standing Status:    Standing         Number of Occurrences:    1        ?  GLUCOSE, POC              Standing Status:    Standing         Number of Occurrences:    1        ?  GLUCOSE, POC              Standing Status:    Standing         Number of Occurrences:    1        ?  GLUCOSE, POC              Standing Status:    Standing         Number of Occurrences:    1        ?  GLUCOSE, POC              Standing Status:    Standing         Number of Occurrences:    1        ?  GLUCOSE, POC              Standing Status:    Standing         Number of Occurrences:    1        ?  GLUCOSE, POC              Standing Status:    Standing         Number of Occurrences:    1        ?  GLUCOSE, POC  Standing Status:    Standing         Number of Occurrences:    1        ?  GLUCOSE, POC              Standing Status:    Standing         Number of Occurrences:    1        ?  GLUCOSE, POC              Standing Status:    Standing         Number of Occurrences:    1        ?  GLUCOSE, POC              Standing Status:    Standing         Number of Occurrences:    1        ?  GLUCOSE, POC              Standing Status:    Standing         Number of Occurrences:    1        ?  GLUCOSE, POC              Standing Status:    Standing         Number of Occurrences:    1        ?  GLUCOSE, POC               Standing Status:    Standing         Number of Occurrences:    1        ?  GLUCOSE, POC              Standing Status:    Standing         Number of Occurrences:    1        ?  GLUCOSE, POC              Standing Status:    Standing         Number of Occurrences:    1        ?  GLUCOSE, POC              Standing Status:    Standing         Number of Occurrences:    1        ?  GLUCOSE, POC              Standing Status:    Standing         Number of Occurrences:    1        ?  GLUCOSE, POC              Standing Status:    Standing         Number of Occurrences:    1        ?  GLUCOSE, POC              Standing Status:    Standing         Number of Occurrences:    1        ?  GLUCOSE, POC              Standing Status:    Standing  Number of Occurrences:    1        ?  GLUCOSE, POC              Standing Status:    Standing         Number of Occurrences:    1        ?  GLUCOSE, POC              Standing Status:    Standing         Number of Occurrences:    1        ?  GLUCOSE, POC              Standing Status:    Standing         Number of Occurrences:    1        ?  GLUCOSE, POC              Standing Status:    Standing         Number of Occurrences:    1        ?  GLUCOSE, POC              Standing Status:    Standing         Number of Occurrences:    1        ?  GLUCOSE, POC              Standing Status:    Standing         Number of Occurrences:    1        ?  GLUCOSE, POC              Standing Status:    Standing         Number of Occurrences:    1        ?  GLUCOSE, POC              Standing Status:    Standing         Number of Occurrences:    1        ?  GLUCOSE, POC              Standing Status:    Standing         Number of Occurrences:    1        ?  GLUCOSE, POC              Standing Status:    Standing         Number of Occurrences:    1        ?  GLUCOSE, POC              Standing Status:    Standing         Number of Occurrences:    1        ?  GLUCOSE, POC              Standing Status:    Standing          Number of Occurrences:    1        ?  GLUCOSE, POC              Standing Status:    Standing         Number of Occurrences:    1        ?  GLUCOSE, POC              Standing Status:    Standing         Number of Occurrences:    1        ?  GLUCOSE, POC              Standing Status:    Standing         Number of Occurrences:    1        ?  EKG, 12 LEAD, SUBSEQUENT              Standing Status:    Standing         Number of Occurrences:    1         Order Specific Question:    Reason for Exam:         Answer:    ST elevation        ?  EEG              Standing Status:    Standing         Number of Occurrences:    1         Order Specific Question:    Reason for Exam:         Answer:    seizure        ?  DISCHARGE PATIENT              Standing Status:    Standing         Number of Occurrences:    1        ?  FOAM FOOT DROP BOOTS             For bilateral heels: to be applied at all times daily while in the bed for off-loading of the heels and to prevent further skin breakdown.              Standing Status:    Standing         Number of Occurrences:    1        ?  SPECIALTY SUPPORT SURFACE             [employer] Sport with air while in ICU.              Standing Status:    Standing         Number of Occurrences:    1         Order Specific Question:    Specialty Support Surface:         Answer:    ICU Sport Bed        ?  SCANNED RADIOLOGY RESULTS     ?  sodium chloride 0.9 % bolus infusion 1,000 mL     ?  etomidate (AMIDATE) 2 mg/mL injection             Gryder, Christal: cabinet override        ?  DISCONTD: propofol (DIPRIVAN) 10 mg/mL infusion              Order Specific Question:    Titrate Infusion?         Answer:    Yes         Order Specific Question:    Initial Infusion Rate:         Answer:    20 mcg/kg/min  Order Specific Question:    Titrate Every 5 Minutes in Increments of:         Answer:    5 mcg/kg/min         Order Specific Question:    Goal RASS:         Answer:    0 to -1         Order  Specific Question:    Contact Physician for:         Answer:    HR <50 bpm and/or SBP <90 mmHg         Order Specific Question:    Hold For:         Answer:    Spontaneous Breathing Trial         Order Specific Question:    Hold For:         Answer:    Oversedation        ?  piperacillin-tazobactam (ZOSYN) 3.375 g in 0.9% sodium chloride (MBP/ADV) 100 mL MBP              Order Specific Question:    Antibiotic Indications         Answer:    Pneumonia (CAP)        ?  DISCONTD: glucose chewable tablet 16 g     ?  DISCONTD: dextrose (D50W) injection syrg 12.5-25 g     ?  DISCONTD: glucagon (GLUCAGEN) injection 1 mg     ?  DISCONTD: insulin lispro (HUMALOG) injection     ?  DISCONTD: metoprolol (LOPRESSOR) injection 5 mg     ?  DISCONTD: sodium chloride (NS) flush 5-40 mL     ?  DISCONTD: sodium chloride (NS) flush 5-40 mL     ?  DISCONTD: acetaminophen (TYLENOL) tablet 650 mg     ?  DISCONTD: acetaminophen (TYLENOL) suppository 650 mg     ?  DISCONTD: polyethylene glycol (MIRALAX) packet 17 g     ?  DISCONTD: ondansetron (ZOFRAN ODT) tablet 4 mg     ?  DISCONTD: ondansetron (ZOFRAN) injection 4 mg     ?  DISCONTD: heparin (porcine) injection 5,000 Units     ?  DISCONTD: 0.9% sodium chloride infusion     ?  DISCONTD: levoFLOXacin (LEVAQUIN) 500 mg in D5W IVPB              Order Specific Question:    Antibiotic Indications         Answer:    Aspiration Pneumonia        ?  etomidate (AMIDATE) 2 mg/mL injection             Ivar Bury: cabinet override        ?  rocuronium 10 mg/mL injection             Ivar Bury: cabinet override        ?  succinylcholine (ANECTINE) 20 mg/mL injection             Ivar Bury: cabinet override        ?  succinylcholine (ANECTINE) 20 mg/mL injection             Ivar Bury: cabinet override        ?  technetium albumin aggregated (MAA) solution 4 millicurie     ?  propofoL (DIPRIVAN) 10 mg/mL injection             Leim Fabry: cabinet override        ?  DISCONTD: dextrose 5%  lactated ringers infusion     ?  potassium chloride 10 mEq in 100 ml IVPB     ?  DISCONTD: meropenem (MERREM) 1 g in sterile water (preservative free) 20 mL IV syringe              Order Specific Question:    Antibiotic Indications         Answer:    Aspiration Pneumonia        ?  pantoprazole (PROTONIX) 40 mg in 0.9% sodium chloride 10 mL injection              Order Specific Question:    PPI INDICATION         Answer:    SUP Prophylaxis        ?  DISCONTD: chlorhexidine (PERIDEX) 0.12 % mouthwash 15 mL     ?  DISCONTD: levoFLOXacin (LEVAQUIN) 500 mg in D5W IVPB              Order Specific Question:    Antibiotic Indications         Answer:    Aspiration Pneumonia        ?  DISCONTD: albuterol-ipratropium (DUO-NEB) 2.5 MG-0.5 MG/3 ML              Order Specific Question:    MODE OF DELIVERY         Answer:    Nebulizer        ?  potassium chloride 10 mEq in 100 ml IVPB     ?  iopamidoL (ISOVUE-370) 76 % injection 100 mL     ?  DISCONTD: aspirin delayed-release tablet 81 mg     ?  DISCONTD: atorvastatin (LIPITOR) tablet 40 mg     ?  potassium chloride 10 mEq in 100 ml IVPB     ?  DISCONTD: insulin glargine (LANTUS) injection 5 Units     ?  DISCONTD: insulin lispro (HUMALOG) injection 3 Units     ?  gadobenate dimeglumine (MULTIHANCE) 529 mg/mL (0.76mmol/0.2mL) contrast injection 14 mL     ?  DISCONTD: dilTIAZem (CARDIZEM) 125 mg/125 mL (1 mg/mL) dextrose 5% infusion              Order Specific Question:    Titrate Infusion?         Answer:    No        ?  DISCONTD: LORazepam (ATIVAN) injection 1 mg     ?  DISCONTD: morphine injection 2 mg     ?  DISCONTD: glycopyrrolate (ROBINUL) injection 0.1 mg     ?  DISCONTD: insulin glargine (LANTUS) injection 5 Units     ?  DISCONTD: meropenem (MERREM) 1 g in sterile water (preservative free) 20 mL IV syringe              Order Specific Question:    Antibiotic Indications         Answer:    Aspiration Pneumonia        ?  DISCONTD: metoprolol (LOPRESSOR) injection 5 mg     ?   DISCONTD: propofol (DIPRIVAN) 10 mg/mL infusion              Order Specific Question:    Titrate Infusion?         Answer:    Yes         Order Specific Question:    Initial Infusion Rate:  Answer:    10 mcg/kg/min         Order Specific Question:    Titrate Every 5 Minutes in Increments of:         Answer:    5 mcg/kg/min         Order Specific Question:    Goal RASS:         Answer:    0 to -1         Order Specific Question:    Contact Physician for:         Answer:    HR <50 bpm and/or SBP <90 mmHg         Order Specific Question:    Hold For:         Answer:    Spontaneous Breathing Trial         Order Specific Question:    Hold For:         Answer:    Oversedation        ?  DISCONTD: insulin lispro (HUMALOG) injection     ?  DISCONTD: glucose chewable tablet 16 g     ?  DISCONTD: glucagon (GLUCAGEN) injection 1 mg     ?  DISCONTD: dextrose (D50W) injection syrg 12.5-25 g     ?  NOREPINephrine (LEVOPHED) 8 mg/250 mL (32 mcg/mL) in dextrose 5% 250 mL (32 mcg/mL) infusion             Turner, Elizabeth: cabinet override        ?  DISCONTD: NOREPINephrine (LEVOPHED) 8 mg in 5% dextrose (32 mcg/mL) infusion              Order Specific Question:    Titrate Infusion?         Answer:    Yes         Order Specific Question:    Initial Infusion Rate:         Answer:    4 mcg/min         Order Specific Question:    Titrate Every 5 Minutes In Increments of:         Answer:    1 mcg/min         Order Specific Question:    Goal of Therapy is:         Answer:    MAP greater than 65 mmHg         Order Specific Question:    Contact Physician for:         Answer:    Patient is not achieving goal and is at max rate        ?  morphine (ROXANOL) 100 mg/5 mL (20 mg/mL) concentrated solution             Sig: Take 0.5 mL by mouth every two (2) hours as needed for Pain for up to 3 days. Max Daily Amount: 120 mg.         Dispense:  30 mL         Refill:  0        ?  INITIAL PHYSICIAN ORDER: INPATIENT Intensive Care; 4.  Patient requires ICU level of care interventions (further clarification in H&P documentation)              Standing Status:    Standing         Number of Occurrences:    1         Order  Specific Question:    Status:         Answer:    INPATIENT [101]         Order Specific Question:    Type of Bed         Answer:    Intensive Care [6]         Order Specific Question:    Inpatient Hospitalization Certified Necessary for the Following Reasons         Answer:    4. Patient requires ICU level of care interventions (further clarification in H&P documentation)         Order Specific Question:    Admitting Diagnosis         Answer:    AMS (altered mental status) [1610960]         Order Specific Question:    Admitting Physician         Answer:    Alisia Ferrari [phone]         Order Specific Question:    Attending Physician         Answer:    Alisia Ferrari [phone]         Order Specific Question:    Estimated Length of Stay         Answer:    2 Midnights         Order Specific Question:    Discharge Plan:         Answer:    Home Health Care        ?  IP CONSULT TO CASE MANAGEMENT              Standing Status:    Standing         Number of Occurrences:    1         Order Specific Question:    Reasons for Consult:         Answer:    Assistance with Discharge Planning        ?  IP CONSULT TO WOUND CARE              Standing Status:    Standing         Number of Occurrences:    1         Order Specific Question:    Reason for Consult:         Answer:    left heal wound        ?  IP CONSULT TO NUTRITION SERVICES              Standing Status:    Standing         Number of Occurrences:    1         Order Specific Question:    Reason for Consult:         Answer:    Wounds        ?  IP CONSULT TO NUTRITION SERVICES              Standing Status:    Standing         Number of Occurrences:    1         Order Specific Question:    Reason for Consult:         Answer:    Tube Feedings Order and Management (Not  available in [place])        ?  IP CONSULT TO ETHICS  Standing Status:    Standing         Number of Occurrences:    1         Order Specific Question:    Reason for Consult:         Answer:    Family dynamic issues        ?  IP CONSULT TO WOUND CARE              Standing Status:    Standing         Number of Occurrences:    1         Order Specific Question:    Reason for Consult:              Answer:    Left heel wound                     CONSULTANTS:   Consults   kardar admit. D/w kristen biship      Provider Notes (Medical Decision Making):    69 year old female presents with episode of altered mental status unresponsive.  Initially hypoglycemia.  Concern for possible opiate overdose a slight improvement with Narcan for EMS.  Patient had episode  of desatting altered mental status down to the 70s.  Patient is too altered to have BiPAP placed.  Persisted and patient was altered.  Suctioning did not improve her symptoms.  Patient was intubated.           Procedures                          CRITICAL CARE NOTE :   10:33 AM   Amount of Critical Care Time: 55 minutes      IMPENDING DETERIORATION -Airway, Respiratory, Cardiovascular and CNS   ASSOCIATED RISK FACTORS - Hypotension, Hypoxia and CNS Decompensation   MANAGEMENT- Bedside Assessment and Supervision of Care   INTERPRETATION -  Xrays, CT Scan and ECG   INTERVENTIONS - hemodynamic mngmt   CASE REVIEW - Hospitalist, Medical Sub-Specialist and Nursing   TREATMENT RESPONSE -Improved   PERFORMED BY - Self      NOTES   :   I have spent critical care time involved in lab review, consultations with specialist, family decision- making, bedside attention and documentation. This time excludes time spent in any separate billed  procedures.  During this entire length of time I was immediately available to the patient .      Forde RadonHeather M Davied Nocito, MD      INTUBATION #1   Patient intubated secondary to respiratory failure.  Sats to 72.  Supplemental oxygen and bagged prior  to intubation.  RSI with etomidate and succinylcholine.  A 7.5 ET tube.  Glide scope utilized.   Cords visualized.  Suction as there was copious material by the airway.  Tube passed without difficulty.  Patient tolerated procedure well.  Patient's O2 saturations were then 100% after being placed on the ventilator.  Good condensation in the tube.   Postintubation chest x-ray ordered.      INTUBATION #2   Patient self extubated.  Patient reintubated secondary to airway protection given copious secretions.  Glide scope utilized.  8 oh ET tube.  Cords visualized.  Etomidate and rocuronium utilized.  Tube passed without difficulty.  Tube secured again.  O2  saturation remained 100%.  Good condensation in the tube.  Post reduction chest x-ray ordered.  Disposition           Emergency Department Disposition:  Admit ICUI.            Diagnosis        Clinical Impression:       1.  Respiratory distress         2.  Syncope and collapse         3.  Hypoxia      aspiration pna      Attestations:      Forde Radon, MD      Please note that this dictation was completed with Dragon, the computer voice recognition software.  Quite often unanticipated grammatical, syntax, homophones, and other interpretive errors are inadvertently  transcribed by the computer software.  Please disregard these errors.  Please excuse any errors that have escaped final proofreading.  Thank you.

## 2019-11-10 NOTE — Consults (Signed)
[facility]  CONSULTATION    Name:  Kristin Cooper, Kristin Cooper  MR#:  [id number]  DOB:  October 20, 1951  ACCOUNT #:  [id number]  DATE OF SERVICE:  11/11/2019    ATTENDING PHYSICIAN:  Dr. Michaelle Copas, hospitalist.    REASON FOR CONSULTATION:  Needs ventilator management.    HISTORY OF PRESENT ILLNESS:  The patient is a 69 year old African-American female.  Information is obtained from current medical records.  Case was discussed with nursing staff.  Of note, I was never informed about this consult.  I saw her name on my list this morning.  The patient apparently has a history of lung cancer and had been under chemotherapy.  Apparently, her last chemotherapy was three or four weeks ago.  She also had a history of diabetes mellitus and a prior stroke and left heel ulcer.  The patient apparently moved from [place] to [place] to live with her daughter.  She has still been going to [facility] for her doctor's appointment.  She has been on oral doxycycline for a left heel ulcer.  The patient apparently has had several episodes of unresponsiveness in the last one week.  With this episode, she just stared and did not respond.  It appears that she had another prolonged episode.  She was unresponsive and had an episode of emesis.  EMS was called.  When the paramedics arrived, she had good vital signs.  However, en route, she was intubated for airway protection.  Narcan was given as well.  Also noted was blood sugar of 53.  In the emergency room, she was further stabilized and then was transferred over to the ICU.  This patient has been on just 5 mcg of propofol.  This has been on hold for the last half an hour or so.  She will only open her eyes to stimulation, but does not follow any command.  No significant endotracheal tube secretions.  She has NG tube, which is clamped.  Urine output is reasonable.  Hemodynamically stable.  No further information can be obtained.    PAST MEDICAL HISTORY:  Significant for  lung cancer and she has been on chemotherapy, last treatment was three to four weeks ago.  Diabetes mellitus, peripheral vascular disease status post right transmetatarsal amputation, history of stroke, and left heel ulcer.    MEDICATIONS:  Currently, the patient is started on heparin 5000 units subcutaneous q. 8 hourly, Levaquin 500 mg IV q. 48 hours, meropenem 1 g IV q. 12 hourly, Protonix 40 mg IV daily, and other p.r.n. medications.    ALLERGIES:  NO KNOWN DRUG ALLERGIES.    SOCIAL HISTORY:  She apparently lives with her daughter.  Unable to obtain any history of drug, alcohol, tobacco abuse.    Occupational history, family history, and review of systems are unobtainable from the patient, who is intubated and with altered mental status.    PHYSICAL EXAMINATION:  GENERAL:  The patient is elderly appearing African-American female, who is on the ventilator.  VITAL SIGNS:  Temperature is 98.3, pulse is 115 per minute, respiratory rate is 14 per minute, and blood pressure is 143/81.  Saturation is 100%.  HEENT:  Pupils are equal and reactive to light.  No significant pallor or icterus.  Nasal passages are patent.  She has NG tube in the right naris, which is clamped.  She has an orally passed 7.5 endotracheal tube at 22 cm.  No significant oral or endotracheal tube secretions.  NECK:  Supple.  Trachea  is central.  No JVD or lymphadenopathy.  LUNGS:  She is not using any accessory muscles of respiration.  CHEST:  Symmetrical with equal and fair air entry bilaterally.  Coarse breath sounds are noted, but otherwise mostly clear.  HEART:  Rhythm is regular with monitor showing sinus tachycardia.  ABDOMEN:  Obese and benign.  Bowel sounds are audible.  No masses or organomegaly.  EXTREMITIES:  Do not show any cyanosis or clubbing.  She has no pitting edema.  She is status post right metatarsal amputation.  She has ulcer on her left heel, which is bandaged.  Pulses are palpable.  NEUROLOGIC:  Shows that the patient is only  opening her eyes to stimulation.  She will not follow any commands.  Not moving her extremities.  SKIN:  Warm and dry.    LABORATORY AND DIAGNOSTIC DATA:  Portable chest x-ray done yesterday on 11/10/2019 shows endotracheal tube to be in good position.  No acute process.  It is not repeated.  There is some prominence of the left hilum.  V/Q scan done on 01/01 showed normal perfusion.  CT of the head and CT of the cervical spine showed no acute process.  Arterial blood gas was done yesterday on 100% ventilator, showed a pH of 7.45, pCO2 of 30 and PAO2 of 248.  Electrolytes are mostly within normal limits, although potassium is low at 3.6.  BUN is 25, creatinine is 1.29 and initially it was 2.01.  Blood glucose en route to the ER was 53 and repeat is 121.  WBC count is 8.9 with neutrophils of 87%.  Initially WBC count was 11.9, hemoglobin is 11, platelet count of 241.  INR is 1.1.  D-dimer 10.6.  BNP is 390, lactic acid 2.7.  Troponin is negative.  Procalcitonin is negative.    ASSESSMENT AND PLAN:  33.  A 69 year old African-American female with multiple medical problems including history of prior stroke and lung cancer who has been on chemotherapy, presented with unresponsiveness.  She was intubated en route for airway protection.  Currently, she is off propofol.  She is minimally responsive, only opening eyes.  Neurology is consulted.  CT of the head and spine did not show any acute process.  Chest x-ray does not show any focal infiltrates.  There is some mild prominence to the left hilum.  Apparently, she has a history of lung cancer.  I will leave her off sedation to assess neurologically.  I will obtain another blood gas now and make further vent changes.  Currently vent settings include a tidal volume of 450/245%,assist control of 14, and PEEP of 5.  Of note, there has been no significant endotracheal tube secretions.  I will start her on nebulizer bronchodilator treatments on a p.r.n. basis.  2.  Altered mental  status.  As discussed above, Neurology is consulted.  Hold propofol for further neurologic evaluation.  3.  History of lung cancer.  Details are not available.  Chest x-ray, however, does not show any significant mass or nodule or other chronic changes.  4.  History of stroke.  5.  Diabetes mellitus.  6.  For deep venous thrombosis prophylaxis, the patient is started on heparin subcutaneously, which will be continued.  7.  For gastrointestinal stress prophylaxis, she is started on Protonix IV which will be continued.  8.  Acute renal insufficiency.  I will give her some IV fluids and continue to monitor.  9.  For nutrition, she can be started on Glucerna.  We will get a nutritionist input as well.    Thank you for allowing me to participate in the care of this patient.  I will be happy to talk with the family members when available.  The patient is critically ill.  Approximately 1 hour was spent in the management of this patient.  Prognosis is guarded.  Await further input from Neurology.      Glena Norfolk, MD      [id number]  D:  11/11/2019 9:52  T:  11/11/2019 12:51  JOB #:  [id number]

## 2019-11-10 NOTE — ED Notes (Signed)
21:00-21:45 Patient sent to nuclear medicine for test accompanied by registered nurse and respiratory therapist.

## 2019-11-10 NOTE — H&P (Signed)
History and Physical    Patient: Kristin Cooper MRN: [id number]  SSN: [id number]    Date of Birth: July 03, 1951  Age: 69 y.o.  Sex: female      Subjective:      Kristin Cooper is a 69 y.o. female who presented to the ER by EMS on 11/10/2019 for being found unresponsive by family members.  Patient has a past medical history of diabetes insulin-dependent, CVA, lung cancer.  Patient is from [place], [place].  She moved up to this area to be with her daughter so she can care for her.  However they do go down to [place] for her doctor's appointments.  She is currently being treated with oral doxycycline for a left heel ulcer.  Per daughter patient has had several episodes of unresponsiveness in the last week.  She states these episodes she just stares and does not respond.  Yesterday she had an episode like this but she woke up and took her medicine and was able to eat.  Today she was unresponsive again and then had an episode of vomiting. EMS was called.  Per daughter her a family member started to do CPR.  When EMS arrived she had good vital signs and a pulse.  She was intubated in route and given Narcan to protect airway.  Of note patient had her last chemotherapy approximately 3 to 4 weeks ago for her lung cancer    In route patient's blood glucose level was 53.  Vital signs were stable.  She is currently intubated.  CT of the head was unremarkable.  Chest x-ray unremarkable however concern for possible aspiration pneumonia as she had a vomiting episode and became unresponsive.  She started on IV Zosyn.    Daughter at bedside and is the POA.  Her name is [name] at number [phone]    Past Medical History:   Diagnosis Date   ??? Lung cancer ([place])      No past surgical history on file.   No family history on file.  Social History     Tobacco Use   ??? Smoking status: Not on file   Substance Use Topics   ??? Alcohol use: Not on file      Prior to Admission medications    Medication Sig Start Date End Date  Taking? Authorizing Provider   atorvastatin (LIPITOR) 40 mg tablet Take 40 mg by mouth daily.   Yes Provider, Historical   glimepiride (AMARYL) 4 mg tablet Take 4 mg by mouth every morning.   Yes Provider, Historical   lisinopriL (PRINIVIL, ZESTRIL) 40 mg tablet Take 40 mg by mouth daily.   Yes Provider, Historical   amLODIPine (Norvasc) 10 mg tablet Take 10 mg by mouth daily.   Yes Provider, Historical   clonazePAM (KlonoPIN) 0.5 mg tablet Take 0.5 mg by mouth three (3) times daily.   Yes Provider, Historical   traMADoL (ULTRAM) 50 mg tablet Take 50 mg by mouth every six (6) hours as needed for Pain.   Yes Provider, Historical   insulin glargine (Lantus Solostar U-100 Insulin) 100 unit/mL (3 mL) inpn 40 Units by SubCUTAneous route nightly.   Yes Provider, Historical        No Known Allergies    Review of Systems:    Unable to assess as patient is intubated. Daughter at bedside and most information gathered thru her.       Objective:     Vitals:    11/10/19 0934 11/10/19 1031 11/10/19 1128  11/10/19 1242   BP: (!) 183/85   (!) 153/96   Pulse: (!) 131   (!) 114   Resp: _0 Temp:    98.6 ??F (37 ??C)   SpO2: 100% 100%  100%   Weight:   59 kg (130 lb)    Height:   5' 1" (1.549 m)         Physical Exam:  General: intubated  Eye: conjunctivae/corneas clear. PERRL, EOM's intact.   Throat and Neck: normal and no erythema or exudates noted. No mass   Lung: diminsihed  Heart: regular rate and rhythm,   Abdomen: soft, non-tender. Bowel sounds normal. No masses,  Extremities:  able to move all extremities normal, atraumatic  Skin: Normal.  Neurologic: intubate  Psychiatric: unable to assess     Recent Results (from the past 24 hour(s))   EKG, 12 LEAD, INITIAL    Collection Time: 11/10/19  9:31 AM   Result Value Ref Range    Ventricular Rate 128 BPM    Atrial Rate 129 BPM    P-R Interval 120 ms    QRS Duration 126 ms    Q-T Interval 318 ms    QTC Calculation (Bezet) 464 ms    Calculated P Axis 40 degrees    Calculated R  Axis 21 degrees    Calculated T Axis 25 degrees    Diagnosis       Sinus tachycardia  Right bundle branch block  Abnormal ECG  No previous ECGs available  Confirmed by Marcello Moores, DEEPAK ([id number]) on 11/10/2019 12:27:21 PM     GLUCOSE, POC    Collection Time: 11/10/19  9:32 AM   Result Value Ref Range    Glucose (POC) 427 (H) 65 - 100 mg/dL    Performed by GRYDER CHRISTAL    CBC WITH AUTOMATED DIFF    Collection Time: 11/10/19 11:50 AM   Result Value Ref Range    WBC 11.9 (H) 3.6 - 11.0 K/uL    RBC 4.40 3.80 - 5.20 M/uL    HGB 13.6 11.5 - 16.0 g/dL    HCT 41.8 35.0 - 47.0 %    MCV 95.0 80.0 - 99.0 FL    MCH 30.9 26.0 - 34.0 PG    MCHC 32.5 30.0 - 36.5 g/dL    RDW 14.4 11.5 - 14.5 %    PLATELET 305 150 - 400 K/uL    MPV 10.2 8.9 - 12.9 FL    NEUTROPHILS 92 (H) 32 - 75 %    LYMPHOCYTES 4 (L) 12 - 49 %    MONOCYTES 4 (L) 5 - 13 %    EOSINOPHILS 0 0 - 7 %    BASOPHILS 0 0 - 1 %    IMMATURE GRANULOCYTES 0 0.0 - 0.5 %    ABS. NEUTROPHILS 11.0 (H) 1.8 - 8.0 K/UL    ABS. LYMPHOCYTES 0.5 (L) 0.8 - 3.5 K/UL    ABS. MONOCYTES 0.5 0.0 - 1.0 K/UL    ABS. EOSINOPHILS 0.0 0.0 - 0.4 K/UL    ABS. BASOPHILS 0.0 0.0 - 0.1 K/UL    ABS. IMM. GRANS. 0.0 0.00 - 0.04 K/UL    DF AUTOMATED     METABOLIC PANEL, COMPREHENSIVE    Collection Time: 11/10/19 11:50 AM   Result Value Ref Range    Sodium 147 (H) 136 - 145 mmol/L    Potassium 4.1 3.5 - 5.1 mmol/L    Chloride 116 (H) 97 - 108 mmol/L  CO2 22 21 - 32 mmol/L    Anion gap 9 5 - 15 mmol/L    Glucose 146 (H) 65 - 100 mg/dL    BUN 37 (H) 6 - 20 mg/dL    Creatinine 2.01 (H) 0.55 - 1.02 mg/dL    BUN/Creatinine ratio 18 12 - 20      GFR est AA 30 (L) >60 ml/min/1.110m    GFR est non-AA 25 (L) >60 ml/min/1.739m   Calcium 10.2 (H) 8.5 - 10.1 mg/dL    Bilirubin, total 1.0 0.2 - 1.0 mg/dL    AST (SGOT) 38 (H) 15 - 37 U/L    ALT (SGPT) 23 12 - 78 U/L    Alk. phosphatase 109 45 - 117 U/L    Protein, total 8.4 (H) 6.4 - 8.2 g/dL    Albumin 3.8 3.5 - 5.0 g/dL    Globulin 4.6 (H) 2.0 - 4.0 g/dL    A-G Ratio 0.8  (L) 1.1 - 2.2     TROPONIN I    Collection Time: 11/10/19 11:50 AM   Result Value Ref Range    Troponin-I, Qt. <0.05 <0.05 ng/mL   BNP    Collection Time: 11/10/19 11:50 AM   Result Value Ref Range    NT pro-BNP 390 (H) <125 pg/mL   LACTIC ACID    Collection Time: 11/10/19 11:50 AM   Result Value Ref Range    Lactic acid 2.7 (HH) 0.4 - 2.0 mmol/L   TSH 3RD GENERATION    Collection Time: 11/10/19 11:50 AM   Result Value Ref Range    TSH 0.07 (L) 0.36 - 3.74 uIU/mL   MAGNESIUM    Collection Time: 11/10/19 11:50 AM   Result Value Ref Range    Magnesium 2.2 1.6 - 2.4 mg/dL   URINALYSIS W/ REFLEX CULTURE    Collection Time: 11/10/19 12:44 PM    Specimen: Urine   Result Value Ref Range    Color Yellow/Straw      Appearance Clear Clear      Specific gravity 1.014 1.003 - 1.030      pH (UA) 5.0 5.0 - 8.0      Protein 100 (A) Negative mg/dL    Glucose 50 (A) Negative mg/dL    Ketone 5 (A) Negative mg/dL    Bilirubin Negative Negative      Blood Small (A) Negative      Urobilinogen 0.1 0.1 - 1.0 EU/dL    Nitrites Negative Negative      Leukocyte Esterase Negative Negative      UA:UC IF INDICATED Culture not indicated by UA result Culture not indicated by UA result      WBC 0-4 0 - 4 /hpf    RBC 0-5 0 - 5 /hpf    Bacteria Negative Negative /hpf   BLOOD GAS, ARTERIAL    Collection Time: 11/10/19 12:55 PM   Result Value Ref Range    pH 7.45 7.35 - 7.45      PCO2 30 (L) 35 - 45 mmHg    PO2 248 (H) 75 - 100 mmHg    O2 SAT 100 >95 %    BICARBONATE 22 22 - 26 mmol/L    BASE DEFICIT 2.6 (H) 0 - 2 mmol/L    O2 METHOD VENT      FIO2 100.0 %    MODE Assist Control/Volume Control      Tidal volume 450      SET RATE 14  EPAP/CPAP/PEEP 5.0      SITE Right Radial      ALLEN'S TEST PASS         XR Results (maximum last 3):  Results from [facility] encounter on 11/10/19   XR CHEST SNGL V    Narrative Reason for Visit:   HISTORY: Intubation.       TECHNIQUE: Portable AP radiograph the chest dated 11/10/2019 obtained at 10:54  AM.  COMPARISON: 11/10/2019 obtained earlier in the morning.  LIMITATIONS: None.        TUBES/LINES:  The endotracheal tube tip overlies the trachea approximately 3 cm  above the carina.    HEART AND PULMONARY VESSELS: Stable without cardiac decompensation.    LUNG PARENCHYMA: Normal lung volumes. This examination is negative for airspace  disease.        PLEURA: Normal.      MEDIASTINUM: There is moderate ectasia of the thoracic aorta.     BONE/SOFT TISSUES: There are bridging paraspinal osteophytes along the mid to  lower thoracic spine related to diffuse idiopathic skeletal hyperostosis.          Impression IMPRESSION:  1.  The endotracheal tube tip overlies the tracheal airway approximately 37 mm  above the carina.  2.  The lungs are clear without acute pulmonary parenchymal pathology.     XR CHEST SNGL V    Narrative Reason for Visit:   HISTORY: Altered mental status       TECHNIQUE: Portable AP radiograph of the chest dated 11/10/2019 obtained at 9:46  AM.  COMPARISON: None.  LIMITATIONS: Overlying EKG leads.        TUBES/LINES:  None. There is an external cardiac defibrillator pad versus pacer  pad overlying the right hemithorax.    HEART AND PULMONARY VESSELS: There is a normal-sized cardiac silhouette. The  pulmonary vessels are normal in caliber.    LUNG PARENCHYMA: There is a shallow inspiratory effort. The lungs are clear  without airspace disease to suggest aspiration without significant atelectasis.         PLEURA: Normal.      MEDIASTINUM: There is milder ectasia of the thoracic aorta secondary to  atherosclerotic vascular changes.     BONE/SOFT TISSUES: There is right lateral spondylosis along the thoracic spine  consistent with milder changes of diffuse idiopathic skeletal hyperostosis.          Impression IMPRESSION:  1.  The lung parenchyma is clear without acute pulmonary parenchymal pathology.  2.  There is milder ectasia of the thoracic aorta related to mild  atherosclerotic vascular changes.          CT Results (maximum last 3):  Results from [facility] encounter on 11/10/19   CT SPINE CERV WO CONT    Narrative The study is a CT examination of the cervical spine dated 11/10/2019.    HISTORY: Recent syncope.    Technique: Thin section axial imaging was acquired the cervical spine. From the  axial imaging sequence, sagittal and coronal reconstructed imaging is submitted  for interpretation.    Dose Reduction Technique was employed to reduce radiation exposure - This  includes reduction optimization techniques as appropriate to a performed exam  with automated exposure control adjustments of the mA and/or Kv according to  patient size, or use of iterative reconstruction technique.    COMPARISON: None.    Technique: Thin section axial imaging was acquired the cervical spine. From the  axial imaging sequence, sagittal and coronal reconstructed imaging is submitted  for interpretation.    Findings: The anterior and posterior arches to the C1 vertebra are intact. There  is an appropriate alignment of the lateral masses of C1 vertebra relative to the  odontoid process without a Jefferson burst fracture. The odontoid process images  in a normal fashion without an odontoid fracture.    There is an appropriate alignment of the articular masses and the facet joints  without a hyperflexion injury resulting in facet subluxation. There is a normal  height and morphology to the vertebral bodies without an axial loading  compression fracture or a simple wedge fracture. The spinous processes are  intact. This examination is negative for prevertebral soft tissue swelling.    There is moderate degenerative disc disease at the C5-C6 level. There is bony  hypertrophy of the uncovertebral joints with relative narrowing of the neural  foramina. There also is multilevel facet arthropathy along the upper cervical  spine. Imaging through the lung apices is negative for pneumothorax. There is a  nasal cannula tube in place to  protect the patient's airway.      Impression Impression:   1.  This examination is negative for an acute fracture or a subluxation injury  to the cervical spine.  2.  There is moderate degenerative disc disease at the C5-C6 level.  3.  There is right-sided facet arthropathy at the C2-C3 and the C3-C4 levels.  There is narrowing of the right neural foramen at C3-C4 level. There is  left-sided facet arthropathy primarily at the C2-C3 C4-C5 levels. There is  narrowing of the left C4-C5 neural foramen.       CT HEAD WO CONT    Narrative The study is a noncontrasted CT examination of the head dated 11/10/2019.    HISTORY: Syncope.    TECHNIQUE: Thin section axial imaging was performed followed by sagittal and  coronal reconstructed imaging.    Dose Reduction Technique was employed to reduce radiation exposure - This  includes reduction optimization techniques as appropriate to a performed exam  with automated exposure control adjustments of the mA and/or Kv according to  patient size, or use of iterative reconstruction technique.    COMPARISON: None.    The ventricles are normal in size with a midline position. This examination is  negative for intracranial hemorrhage, mass effect or an extra axial collection.  The gray-white matter junctions are preserved without cytotoxic or vasogenic  edema.     An assessment of the white matter tracts demonstrates mild microvascular  changes. There are older lacunar infarcts demonstrated adjacent to the head of  the left caudate nucleus similar changes demonstrated along the anterior limb of  the right internal capsule.    ORBITS: The patient status post previous cataract surgery. There is proptosis of  the orbits more pronounced on the right compared to the left. 6 examination is  negative for an enlargement of the extraocular muscles or a retro-orbital mass.     PARANASAL SINUSES: This examination is negative for acute sinus disease.     The mastoid air cells and middle ear cavities  image in a normal fashion. The  sella and the suprasellar regions are unremarkable. The craniocervical junction  images in a normal fashion.    OTHER: None.      Impression IMPRESSION:   1.  This examination is negative for acute intracranial pathology.  2.  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  3.  There is apparent proptosis more pronounced on the right compared to left.              Assessment:   1.  Altered mental status/unresponsive multifactorial -hypoglycemic vs. seizure vs. CVA  2.  Hypertension  3.  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  Hyperlipidemia  5.  Aspiration pneumonia  6.  Unstageable left heel ulcer    Plan:   1.  CT of the head was unremarkable.  We will get a stat MRI of the brain.  Consult neurology.  We will get a 2D echo and carotid. Get EEG.   2.  Blood pressure is elevated.  She takes lisinopril 40 mg daily and Norvasc 10 mg daily.  We will add on IV Lopressor to keep a blood pressure under 160 and heart rate under 110  3.  Patient's glucose level at EMS was 52.  She normally takes 40 units of Lantus.  Concern that patient's unresponsiveness may be related due to hypoglycemia.  We will check a hemoglobin A1c.  We will do insulin sliding scale  4.  Patient takes atorvastatin 40 mg daily.  On hold as patient is intubated  5.  Patient did have an episode of vomiting while she was unresponsive.  Concern for aspiration pneumonia.  We will keep on IV Zosyn and add levaquin   6.  Patient has a left heel ulcer that she has been treated with doxycycline as an outpatient setting.  She is currently on IV Zosyn.  Consult wound care    DVT prophylaxis Heparin  GI prophylaxis contraindicated at this time    Full code    POA Daughter Caryl Asp - spoke at bedside and updated on results and further plan. Number is [phone]    Critical Care Time: 67 min    Signed By: Ardean Larsen, PA-C     November 10, 2019

## 2019-11-11 ENCOUNTER — Inpatient Hospital Stay: Admit: 2019-11-11 | Payer: MEDICARE | Primary: Adult Health

## 2019-11-11 LAB — GLUCOSE, POC
Glucose (POC): 92 mg/dL (ref 65–100)
Glucose (POC): 242 mg/dL — ABNORMAL HIGH (ref 65–100)
Glucose (POC): 48 mg/dL — CL (ref 65–100)
Glucose (POC): 147 mg/dL — ABNORMAL HIGH (ref 65–100)
Glucose (POC): 73 mg/dL (ref 65–100)
Glucose (POC): 125 mg/dL — ABNORMAL HIGH (ref 65–100)
Glucose (POC): 143 mg/dL — ABNORMAL HIGH (ref 65–100)
Glucose (POC): 150 mg/dL — ABNORMAL HIGH (ref 65–100)
Glucose (POC): 162 mg/dL — ABNORMAL HIGH (ref 65–100)
Glucose (POC): 226 mg/dL — ABNORMAL HIGH (ref 65–100)

## 2019-11-11 LAB — CBC WITH AUTOMATED DIFF
ABS. BASOPHILS: 0 10*3/uL (ref 0.0–0.1)
ABS. EOSINOPHILS: 0 10*3/uL (ref 0.0–0.4)
ABS. IMM. GRANS.: 0 10*3/uL (ref 0.00–0.04)
ABS. LYMPHOCYTES: 0.5 10*3/uL — ABNORMAL LOW (ref 0.8–3.5)
ABS. MONOCYTES: 0.6 10*3/uL (ref 0.0–1.0)
ABS. NEUTROPHILS: 7.8 10*3/uL (ref 1.8–8.0)
BASOPHILS: 0 % (ref 0–1)
EOSINOPHILS: 0 % (ref 0–7)
HCT: 34.6 % — ABNORMAL LOW (ref 35.0–47.0)
HGB: 11 g/dL — ABNORMAL LOW (ref 11.5–16.0)
IMMATURE GRANULOCYTES: 0 % (ref 0.0–0.5)
LYMPHOCYTES: 6 % — ABNORMAL LOW (ref 12–49)
MCH: 29.8 PG (ref 26.0–34.0)
MCHC: 31.8 g/dL (ref 30.0–36.5)
MCV: 93.8 FL (ref 80.0–99.0)
MONOCYTES: 7 % (ref 5–13)
MPV: 9.9 FL (ref 8.9–12.9)
NEUTROPHILS: 87 % — ABNORMAL HIGH (ref 32–75)
PLATELET: 241 10*3/uL (ref 150–400)
RBC: 3.69 M/uL — ABNORMAL LOW (ref 3.80–5.20)
RDW: 14.4 % (ref 11.5–14.5)
WBC: 8.9 10*3/uL (ref 3.6–11.0)

## 2019-11-11 LAB — ECHO ADULT COMPLETE
AV Peak Gradient: 11 mm[Hg]
Aortic Root: 3 cm
EF BP: 71.4 % (ref 55–100)
Est. RA Pressure: 3 mm[Hg]
IVSd: 1.94 cm — AB (ref 0.6–0.9)
LA Major Axis: 2.2 cm
LA Minor Axis: 1.35 cm
LV ESV A2C: 8.91 cm3
LV Mass 2D Index: 136.4 g/m2 — AB (ref 43–95)
LV Mass 2D: 222.8 g — AB (ref 67–162)
LVIDd M-mode: 2.86 cm — AB
LVIDd: 2.9 cm (ref 3.9–5.3)
LVIDs: 1.74 cm
LVOT Peak Gradient: 5 mm[Hg]
LVOT SV: 18.1 cm3
LVPWd: 1.79 cm — AB (ref 0.6–0.9)
MV A Velocity: 127 cm/s
MV Area by PHT: 2.68 cm2
MV E Velocity: 115 cm/s
MV E Wave Deceleration Time: 239 ms
MV E/A: 0.91
MV PHT: 82 ms
Mitral Valve Deceleration Slope: 4170 mm/s2
Mitral Valve Deceleration Slope: 4170 mm/s2
PR Max Velocity: 116 cm/s
PR Max Velocity: 163 cm/s
RA Area 4C: 9.9 cm2
RVIDd: 2.32 cm
RVSP: 32 mm[Hg]
TR Peak Gradient: 29 mm[Hg]
TV Max Velocity: 268 cm/s

## 2019-11-11 LAB — METABOLIC PANEL, BASIC
Anion gap: 5 mmol/L (ref 5–15)
BUN/Creatinine ratio: 19 (ref 12–20)
BUN: 25 mg/dL — ABNORMAL HIGH (ref 6–20)
CO2: 22 mmol/L (ref 21–32)
Calcium: 9.1 mg/dL (ref 8.5–10.1)
Chloride: 122 mmol/L — ABNORMAL HIGH (ref 97–108)
Creatinine: 1.29 mg/dL — ABNORMAL HIGH (ref 0.55–1.02)
GFR est AA: 50 mL/min/{1.73_m2} — ABNORMAL LOW (ref >60)
GFR est non-AA: 41 mL/min/{1.73_m2} — ABNORMAL LOW (ref >60)
Glucose: 121 mg/dL — ABNORMAL HIGH (ref 65–100)
Potassium: 3.6 mmol/L (ref 3.5–5.1)
Sodium: 149 mmol/L — ABNORMAL HIGH (ref 136–145)

## 2019-11-11 LAB — MRSA SCREEN - PCR (NASAL)
MRSA By PCR (Nasal): NOT DETECTED
MRSA by PCR, Nasal: NOT DETECTED

## 2019-11-11 LAB — DUPLEX CAROTID BILATERAL
LEFT EXTERNAL CAROTID ARTERY D: 0 cm/s
LEFT SUBCLAVIAN ARTERY D: 0 cm/s
LEFT VERTEBRAL ARTERY D: -6.38 cm/s
Left CCA dist dias: -20.6 cm/s
Left CCA dist sys: -77.9 cm/s
Left CCA prox dias: 17.6 cm/s
Left CCA prox sys: 89.4 cm/s
Left ECA sys: -217 cm/s
Left ICA dist dias: -33.5 cm/s
Left ICA dist sys: -94.9 cm/s
Left ICA prox dias: 40.8 cm/s
Left ICA prox sys: 186 cm/s
Left subclavian sys: 113 cm/s
Left vertebral sys: -34.7 cm/s
RIGHT EXTERNAL CAROTID ARTERY D: -0.76 cm/s
RIGHT SUBCLAVIAN ARTERY D: 0 cm/s
RIGHT VERTEBRAL ARTERY D: 12.4 cm/s
Right CCA dist dias: -16.5 cm/s
Right CCA prox dias: 16.5 cm/s
Right CCA prox sys: 92.8 cm/s
Right ICA dist dias: -25.9 cm/s
Right ICA dist sys: -89.3 cm/s
Right ICA prox dias: 12.1 cm/s
Right ICA prox sys: 71.8 cm/s
Right cca dist sys: -82.5 cm/s
Right eca sys: -131 cm/s
Right subclavian sys: 83.2 cm/s
Right vertebral sys: 48.3 cm/s

## 2019-11-11 LAB — BLOOD GAS, ARTERIAL
BASE DEFICIT: 0.4 mmol/L (ref 0–2)
BICARBONATE: 24 mmol/L (ref 22–26)
Base Deficit: 0.4 mmol/L (ref 0–2)
EPAP/CPAP/PEEP: 5
EPAP/CPAP/PEEP: 5
FIO2: 45 %
FIO2: 45 %
HCO3: 24 mmol/L (ref 22–26)
O2 SAT: 100 % (ref >95)
O2 Sat: 100 % (ref >95)
PCO2: 29 mmHg — ABNORMAL LOW (ref 35–45)
PCO2: 29 mmHg — ABNORMAL LOW (ref 35–45)
PO2: 183 mmHg — ABNORMAL HIGH (ref 75–100)
PO2: 183 mmHg — ABNORMAL HIGH (ref 75–100)
SET RATE: 14
Set Rate: 14
Tidal Volume: 450
Tidal volume: 450
pH: 7.49 — ABNORMAL HIGH (ref 7.35–7.45)
pH: 7.49 — ABNORMAL HIGH (ref 7.35–7.45)

## 2019-11-11 LAB — HEMOGLOBIN A1C WITH EAG
Est. average glucose: 154 mg/dL
Hemoglobin A1c: 7 % — ABNORMAL HIGH (ref 4.0–5.6)

## 2019-11-11 LAB — CBC WITH AUTO DIFFERENTIAL
Basophils %: 0 % (ref 0–1)
Basophils Absolute: 0 10*3/uL (ref 0.0–0.1)
Eosinophils %: 0 % (ref 0–7)
Eosinophils Absolute: 0 10*3/uL (ref 0.0–0.4)
Granulocyte Absolute Count: 0 10*3/uL (ref 0.00–0.04)
Hematocrit: 34.6 % — ABNORMAL LOW (ref 35.0–47.0)
Hemoglobin: 11 g/dL — ABNORMAL LOW (ref 11.5–16.0)
Immature Granulocytes: 0 % (ref 0.0–0.5)
Lymphocytes %: 6 % — ABNORMAL LOW (ref 12–49)
Lymphocytes Absolute: 0.5 10*3/uL — ABNORMAL LOW (ref 0.8–3.5)
MCH: 29.8 PG (ref 26.0–34.0)
MCHC: 31.8 g/dL (ref 30.0–36.5)
MCV: 93.8 FL (ref 80.0–99.0)
MPV: 9.9 FL (ref 8.9–12.9)
Monocytes %: 7 % (ref 5–13)
Monocytes Absolute: 0.6 10*3/uL (ref 0.0–1.0)
Neutrophils %: 87 % — ABNORMAL HIGH (ref 32–75)
Neutrophils Absolute: 7.8 10*3/uL (ref 1.8–8.0)
Platelets: 241 10*3/uL (ref 150–400)
RBC: 3.69 M/uL — ABNORMAL LOW (ref 3.80–5.20)
RDW: 14.4 % (ref 11.5–14.5)
WBC: 8.9 10*3/uL (ref 3.6–11.0)

## 2019-11-11 LAB — POCT GLUCOSE
POC Glucose: 92 mg/dL (ref 65–100)
POC Glucose: 242 mg/dL — ABNORMAL HIGH (ref 65–100)
POC Glucose: 48 mg/dL — CL (ref 65–100)
POC Glucose: 147 mg/dL — ABNORMAL HIGH (ref 65–100)
POC Glucose: 73 mg/dL (ref 65–100)
POC Glucose: 125 mg/dL — ABNORMAL HIGH (ref 65–100)
POC Glucose: 143 mg/dL — ABNORMAL HIGH (ref 65–100)
POC Glucose: 150 mg/dL — ABNORMAL HIGH (ref 65–100)
POC Glucose: 162 mg/dL — ABNORMAL HIGH (ref 65–100)
POC Glucose: 226 mg/dL — ABNORMAL HIGH (ref 65–100)

## 2019-11-11 LAB — VAS DUP CAROTID BILATERAL
Left CCA dist EDV: -20.6 cm/s
Left CCA dist PSV: -77.9 cm/s
Left CCA prox EDV: 17.6 cm/s
Left CCA prox PSV: 89.4 cm/s
Left ECA EDV: 0 cm/s
Left ECA PSV: -217 cm/s
Left ICA dist EDV: -33.5 cm/s
Left ICA dist PSV: -94.9 cm/s
Left ICA prox EDV: 40.8 cm/s
Left ICA prox PSV: 186 cm/s
Left subclavian EDV: 0 cm/s
Left subclavian PSV: 113 cm/s
Left vertebral EDV: -6.38 cm/s
Left vertebral PSV: -34.7 cm/s
Right CCA dist EDV: -16.5 cm/s
Right CCA prox EDV: 16.5 cm/s
Right CCA prox PSV: 92.8 cm/s
Right ECA EDV: -0.76 cm/s
Right ECA PSV: -131 cm/s
Right ICA dist EDV: -25.9 cm/s
Right ICA dist PSV: -89.3 cm/s
Right ICA prox EDV: 12.1 cm/s
Right ICA prox PSV: 71.8 cm/s
Right cca dist PSV: -82.5 cm/s
Right subclavian EDV: 0 cm/s
Right subclavian PSV: 83.2 cm/s
Right vertebral EDV: 12.4 cm/s
Right vertebral PSV: 48.3 cm/s

## 2019-11-11 LAB — BASIC METABOLIC PANEL
Anion Gap: 5 mmol/L (ref 5–15)
BUN: 25 mg/dL — ABNORMAL HIGH (ref 6–20)
Bun/Cre Ratio: 19 (ref 12–20)
CO2: 22 mmol/L (ref 21–32)
Calcium: 9.1 mg/dL (ref 8.5–10.1)
Chloride: 122 mmol/L — ABNORMAL HIGH (ref 97–108)
Creatinine: 1.29 mg/dL — ABNORMAL HIGH (ref 0.55–1.02)
EGFR IF NonAfrican American: 41 mL/min/{1.73_m2} — ABNORMAL LOW (ref >60)
GFR African American: 50 mL/min/{1.73_m2} — ABNORMAL LOW (ref >60)
Glucose: 121 mg/dL — ABNORMAL HIGH (ref 65–100)
Potassium: 3.6 mmol/L (ref 3.5–5.1)
Sodium: 149 mmol/L — ABNORMAL HIGH (ref 136–145)

## 2019-11-11 LAB — TRANSTHORACIC ECHOCARDIOGRAM (TTE) COMPLETE (CONTRAST/BUBBLE/3D PRN)
AV Peak Gradient: 11 mmHg
Aortic Root: 3 cm
EF BP: 71.4 % (ref 55–100)
Est. RA Pressure: 3 mmHg
IVSd: 1.94 cm — AB (ref 0.6–0.9)
LA Major Axis: 2.2 cm
LA Minor Axis: 1.35 cm
LV ESV A2C: 8.91 cm3
LV Mass 2D Index: 136.4 g/m2 — AB (ref 43–95)
LV Mass 2D: 222.8 g — AB (ref 67–162)
LVIDd M-mode: 2.86 cm — AB
LVIDd: 2.9 cm (ref 3.9–5.3)
LVIDs: 1.74 cm
LVOT Peak Gradient: 5 mmHg
LVOT SV: 18.1 cm3
LVPWd: 1.79 cm — AB (ref 0.6–0.9)
Left Ventricular Ejection Fraction: 71
MV A Velocity: 127 cm/s
MV Area by PHT: 2.68 cm2
MV E Velocity: 115 cm/s
MV E Wave Deceleration Time: 239 ms
MV E/A: 0.91
MV PHT: 82 ms
Mitral Valve E-F Slope by M-mode: 4170 mm/s2
Mitral Valve E-F Slope by M-mode: 4170 mm/s2
PR Max Velocity: 116 cm/s
PR Max Velocity: 163 cm/s
RA Area 4C: 9.9 cm2
RVIDd: 2.32 cm
RVSP: 32 mmHg
TR Peak Gradient: 29 mmHg
TV Max Velocity: 268 cm/s

## 2019-11-11 LAB — HEMOGLOBIN A1C W/EAG
Hemoglobin A1C: 7 % — ABNORMAL HIGH (ref 4.0–5.6)
eAG: 154 mg/dL

## 2019-11-11 MED ORDER — IPRATROPIUM-ALBUTEROL 2.5 MG-0.5 MG/3 ML NEB SOLUTION
2.5 mg-0.5 mg/3 ml | Freq: Four times a day (QID) | RESPIRATORY_TRACT | Status: DC | PRN
Start: 2019-11-11 — End: 2019-11-16

## 2019-11-11 MED ORDER — TECHNETIUM TO 99M ALBUMIN AGGREGATED
Freq: Once | Status: AC
Start: 2019-11-11 — End: 2019-11-10
  Administered 2019-11-11: 02:00:00 via INTRAVENOUS

## 2019-11-11 MED ORDER — CHLORHEXIDINE GLUCONATE 0.12 % MOUTHWASH
0.12 % | Freq: Two times a day (BID) | Status: DC
Start: 2019-11-11 — End: 2019-11-21
  Administered 2019-11-11 – 2019-11-21 (×22): via ORAL

## 2019-11-11 MED ORDER — POTASSIUM CHLORIDE 10 MEQ/100 ML IV PIGGY BACK
10 mEq/0 mL | INTRAVENOUS | Status: AC
Start: 2019-11-11 — End: 2019-11-11
  Administered 2019-11-11 (×4): via INTRAVENOUS

## 2019-11-11 MED ORDER — PROPOFOL 10 MG/ML IV EMUL
10 mg/mL | INTRAVENOUS | Status: AC
Start: 2019-11-11 — End: 2019-11-10
  Administered 2019-11-11: 05:00:00 via INTRAVENOUS

## 2019-11-11 MED ORDER — DEXTROSE 5%-LACTATED RINGERS IV
INTRAVENOUS | Status: DC
Start: 2019-11-11 — End: 2019-11-15
  Administered 2019-11-11 – 2019-11-15 (×7): via INTRAVENOUS

## 2019-11-11 MED ORDER — PANTOPRAZOLE 40 MG IV SOLR
40 mg | Freq: Every day | INTRAVENOUS | Status: AC
Start: 2019-11-11 — End: 2019-11-13
  Administered 2019-11-11 – 2019-11-13 (×3): via INTRAVENOUS

## 2019-11-11 MED ORDER — SUCCINYLCHOLINE CHLORIDE 20 MG/ML INJECTION
20 mg/mL | INTRAMUSCULAR | Status: AC
Start: 2019-11-11 — End: 2019-11-11

## 2019-11-11 MED ORDER — LEVOFLOXACIN IN D5W 500 MG/100 ML IV PIGGY BACK
500 mg/100 mL | INTRAVENOUS | Status: DC
Start: 2019-11-11 — End: 2019-11-16
  Administered 2019-11-12 – 2019-11-14 (×2): via INTRAVENOUS

## 2019-11-11 MED ORDER — MEROPENEM 1 GRAM IV SOLR
1 gram | Freq: Two times a day (BID) | INTRAVENOUS | Status: DC
Start: 2019-11-11 — End: 2019-11-16
  Administered 2019-11-11 – 2019-11-16 (×11): via INTRAVENOUS

## 2019-11-11 MED FILL — HEPARIN (PORCINE) 5,000 UNIT/ML IJ SOLN: 5000 unit/mL | INTRAMUSCULAR | Qty: 1

## 2019-11-11 MED FILL — POTASSIUM CHLORIDE 10 MEQ/100 ML IV PIGGY BACK: 10 mEq/0 mL | INTRAVENOUS | Qty: 100

## 2019-11-11 MED FILL — CHLORHEXIDINE GLUCONATE 0.12 % MOUTHWASH: 0.12 % | Qty: 15

## 2019-11-11 MED FILL — DIPRIVAN 10 MG/ML INTRAVENOUS EMULSION: 10 mg/mL | INTRAVENOUS | Qty: 100

## 2019-11-11 MED FILL — DEXTROSE 5%-LACTATED RINGERS IV: INTRAVENOUS | Qty: 1000

## 2019-11-11 MED FILL — ONDANSETRON (PF) 4 MG/2 ML INJECTION: 4 mg/2 mL | INTRAMUSCULAR | Qty: 2

## 2019-11-11 MED FILL — PROTONIX 40 MG INTRAVENOUS SOLUTION: 40 mg | INTRAVENOUS | Qty: 40

## 2019-11-11 MED FILL — MEROPENEM 1 GRAM IV SOLR: 1 gram | INTRAVENOUS | Qty: 1

## 2019-11-11 MED FILL — DEXTROSE 50% IN WATER (D50W) IV SYRG: INTRAVENOUS | Qty: 50

## 2019-11-11 MED FILL — QUELICIN 20 MG/ML INJECTION SOLUTION: 20 mg/mL | INTRAMUSCULAR | Qty: 10

## 2019-11-11 MED FILL — HUMALOG U-100 INSULIN 100 UNIT/ML SUBCUTANEOUS SOLUTION: 100 unit/mL | SUBCUTANEOUS | Qty: 3

## 2019-11-11 NOTE — Consults (Signed)
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 She had moved here from [place] to take care of her daughter but was still getting medial care in [place]. Family says she had a few episodes of loss of consciousness as well as staring last week. Yesterday she became unresponsive with an episode of emesis and did not wake up. CT head was unrevealing. Labs WNL except mild leukocytosis which since resolved. Vitals stable. No family at bedside and all history obtained from chart.     Subjective     Past Medical History:   Diagnosis Date   ??? Lung cancer (HCC)       No past surgical history on file.  No family history on file.   Social History     Tobacco Use   ??? Smoking status: Not on file   Substance Use Topics   ??? Alcohol use: Not on file       Current Facility-Administered Medications   Medication Dose Route Frequency Provider Last Rate Last Admin   ??? dextrose 5% lactated ringers infusion  100 mL/hr IntraVENous CONTINUOUS Diona Browner, PA-C 100 mL/hr at 11/11/19 0345 100 mL/hr at 11/11/19 0345   ??? potassium chloride 10 mEq in 100 ml IVPB  10 mEq IntraVENous Q1H Kardar, Salvadore Oxford, MD 100 mL/hr at 11/11/19 1202 10 mEq at 11/11/19 1202   ??? meropenem (MERREM) 1 g in sterile water (preservative free) 20 mL IV syringe  1 g IntraVENous Q12H Kardar, Ahmed Marin Olp, MD   1 g at 11/11/19 1540   ??? pantoprazole (PROTONIX) 40 mg in 0.9% sodium chloride 10 mL injection  40 mg IntraVENous DAILY Kardar, Ahmed Marin Olp, MD   40 mg at 11/11/19 0867   ??? chlorhexidine (PERIDEX) 0.12 % mouthwash 15 mL  15 mL Oral Q12H Kardar, Ahmed Marin Olp, MD   15 mL at 11/11/19 0928   ??? [START ON 11/12/2019] levoFLOXacin (LEVAQUIN) 500 mg in D5W IVPB  500 mg IntraVENous Q48H Kardar, Ahmed Marin Olp, MD        ??? albuterol-ipratropium (DUO-NEB) 2.5 MG-0.5 MG/3 ML  3 mL Nebulization Q6H PRN Mughal, Zahid, MD       ??? propofol (DIPRIVAN) 10 mg/mL infusion  0-50 mcg/kg/min (Order-Specific) IntraVENous TITRATE Forde Radon, MD 2.7 mL/hr at 11/10/19 2344 5 mcg/kg/min at 11/10/19 2344   ??? glucose chewable tablet 16 g  4 Tab Oral PRN Bishop, Krysten, PA-C       ??? dextrose (D50W) injection syrg 12.5-25 g  25-50 mL IntraVENous PRN Bishop, Krysten, PA-C   25 g at 11/11/19 0133   ??? glucagon (GLUCAGEN) injection 1 mg  1 mg IntraMUSCular PRN Bishop, Krysten, PA-C       ??? insulin lispro (HUMALOG) injection   SubCUTAneous AC&HS Bishop, Krysten, PA-C   2 Units at 11/11/19 1200   ??? metoprolol (LOPRESSOR) injection 5 mg  5 mg IntraVENous Q6H PRN Bishop, Krysten, PA-C       ??? sodium chloride (NS) flush 5-40 mL  5-40 mL IntraVENous Q8H Bishop, Krysten, PA-C   Stopped at 11/11/19 0600   ??? sodium chloride (NS) flush 5-40 mL  5-40 mL IntraVENous PRN Bishop, Krysten, PA-C   10 mL at 11/11/19 0134   ??? acetaminophen (TYLENOL) tablet 650 mg  650 mg Oral Q6H PRN Margie Ege, PA-C  Or   ??? acetaminophen (TYLENOL) suppository 650 mg  650 mg Rectal Q6H PRN Margie Ege, PA-C       ??? polyethylene glycol (MIRALAX) packet 17 g  17 g Oral DAILY PRN Bishop, Krysten, PA-C       ??? ondansetron (ZOFRAN ODT) tablet 4 mg  4 mg Oral Q8H PRN Bishop, Krysten, PA-C        Or   ??? ondansetron (ZOFRAN) injection 4 mg  4 mg IntraVENous Q6H PRN Bishop, Krysten, PA-C   4 mg at 11/10/19 2346   ??? heparin (porcine) injection 5,000 Units  5,000 Units SubCUTAneous Q8H Bishop, Krysten, PA-C   5,000 Units at 11/11/19 3614        Review of Systems   Unable to perform ROS: Intubated       Objective     Vital signs for last 24 hours:  Visit Vitals  BP (!) 143/81   Pulse (!) 118   Temp 98.2 ??F (36.8 ??C)   Resp 15   Ht 5' 0.98" (1.549 m)   Wt 64.5 kg (142 lb 3.2 oz)   SpO2 100%   BMI 26.88 kg/m??       Intake/Output this shift:   Current Shift: No intake/output data recorded.  Last 3 Shifts: 12/31 1901 - 01/02 0700  In: 2441.9 [I.V.:2441.9]  Out: 1100 [Urine:1100]    Data Review:   Recent Results (from the past 24 hour(s))   URINALYSIS W/ REFLEX CULTURE    Collection Time: 11/10/19 12:44 PM    Specimen: Urine   Result Value Ref Range    Color Yellow/Straw      Appearance Clear Clear      Specific gravity 1.014 1.003 - 1.030      pH (UA) 5.0 5.0 - 8.0      Protein 100 (A) Negative mg/dL    Glucose 50 (A) Negative mg/dL    Ketone 5 (A) Negative mg/dL    Bilirubin Negative Negative      Blood Small (A) Negative      Urobilinogen 0.1 0.1 - 1.0 EU/dL    Nitrites Negative Negative      Leukocyte Esterase Negative Negative      UA:UC IF INDICATED Culture not indicated by UA result Culture not indicated by UA result      WBC 0-4 0 - 4 /hpf    RBC 0-5 0 - 5 /hpf    Bacteria Negative Negative /hpf   BLOOD GAS, ARTERIAL    Collection Time: 11/10/19 12:55 PM   Result Value Ref Range    pH 7.45 7.35 - 7.45      PCO2 30 (L) 35 - 45 mmHg    PO2 248 (H) 75 - 100 mmHg    O2 SAT 100 >95 %    BICARBONATE 22 22 - 26 mmol/L    BASE DEFICIT 2.6 (H) 0 - 2 mmol/L    O2 METHOD VENT      FIO2 100.0 %    MODE Assist Control/Volume Control      Tidal volume 450      SET RATE 14      EPAP/CPAP/PEEP 5.0      SITE Right Radial      ALLEN'S TEST PASS     DUPLEX CAROTID BILATERAL    Collection Time: 11/10/19  3:10 PM   Result Value Ref Range    Left CCA dist sys -77.9 cm/s    Left CCA dist dias -20.6 cm/s  Left CCA prox sys 89.4 cm/s    Left CCA prox dias 17.6 cm/s    Left ICA dist sys -94.9 cm/s    Left ICA dist dias -33.5 cm/s    Left ICA prox sys 186.0 cm/s    Left ICA prox dias 40.8 cm/s    Left ECA sys -217.0 cm/s    LEFT EXTERNAL CAROTID ARTERY D 0.00 cm/s    Left subclavian sys 113.0 cm/s    LEFT SUBCLAVIAN ARTERY D 0.00 cm/s    Left vertebral sys -34.7 cm/s    LEFT VERTEBRAL ARTERY D -6.38 cm/s    Right cca dist sys -82.5 cm/s    Right CCA dist dias -16.5 cm/s     Right CCA prox sys 92.8 cm/s    Right CCA prox dias 16.5 cm/s    Right ICA dist sys -89.3 cm/s    Right ICA dist dias -25.9 cm/s    Right ICA prox sys 71.8 cm/s    Right ICA prox dias 12.1 cm/s    Right eca sys -131.0 cm/s    RIGHT EXTERNAL CAROTID ARTERY D -0.76 cm/s    Right subclavian sys 83.2 cm/s    RIGHT SUBCLAVIAN ARTERY D 0.00 cm/s    Right vertebral sys 48.3 cm/s    RIGHT VERTEBRAL ARTERY D 12.40 cm/s   GLUCOSE, POC    Collection Time: 11/10/19 11:08 PM   Result Value Ref Range    Glucose (POC) 92 65 - 100 mg/dL    Performed by [facility]    MRSA SCREEN - PCR (NASAL)    Collection Time: 11/11/19  1:00 AM   Result Value Ref Range    MRSA by PCR, Nasal Not Detected Not Detected     GLUCOSE, POC    Collection Time: 11/11/19  1:29 AM   Result Value Ref Range    Glucose (POC) 48 (LL) 65 - 100 mg/dL    Performed by [facility]    GLUCOSE, POC    Collection Time: 11/11/19  1:49 AM   Result Value Ref Range    Glucose (POC) 242 (H) 65 - 100 mg/dL    [facility] by [facility]    GLUCOSE, POC    Collection Time: 11/11/19  3:10 AM   Result Value Ref Range    Glucose (POC) 147 (H) 65 - 100 mg/dL    [facility] by [facility]    GLUCOSE, POC    Collection Time: 11/11/19  4:14 AM   Result Value Ref Range    Glucose (POC) 73 65 - 100 mg/dL    Performed by [facility]    METABOLIC PANEL, BASIC    Collection Time: 11/11/19  4:45 AM   Result Value Ref Range    Sodium 149 (H) 136 - 145 mmol/L    Potassium 3.6 3.5 - 5.1 mmol/L    Chloride 122 (H) 97 - 108 mmol/L    CO2 22 21 - 32 mmol/L    Anion gap 5 5 - 15 mmol/L    Glucose 121 (H) 65 - 100 mg/dL    BUN 25 (H) 6 - 20 mg/dL    Creatinine 1.611.29 (H) 0.55 - 1.02 mg/dL    BUN/Creatinine ratio 19 12 - 20      GFR est AA 50 (L) >60 ml/min/1.173m2    GFR est non-AA 41 (L) >60 ml/min/1.3073m2    Calcium 9.1 8.5 - 10.1 mg/dL   CBC WITH AUTOMATED DIFF    Collection Time: 11/11/19  4:45 AM   Result Value Ref Range    WBC 8.9 3.6 - 11.0 K/uL    RBC 3.69 (L) 3.80 - 5.20 M/uL     HGB 11.0 (L) 11.5 - 16.0 g/dL    HCT 34.6 (L) 35.0 - 47.0 %    MCV 93.8 80.0 - 99.0 FL    MCH 29.8 26.0 - 34.0 PG    MCHC 31.8 30.0 - 36.5 g/dL    RDW 14.4 11.5 - 14.5 %    PLATELET 241 150 - 400 K/uL    MPV 9.9 8.9 - 12.9 FL    NEUTROPHILS 87 (H) 32 - 75 %    LYMPHOCYTES 6 (L) 12 - 49 %    MONOCYTES 7 5 - 13 %    EOSINOPHILS 0 0 - 7 %    BASOPHILS 0 0 - 1 %    IMMATURE GRANULOCYTES 0 0.0 - 0.5 %    ABS. NEUTROPHILS 7.8 1.8 - 8.0 K/UL    ABS. LYMPHOCYTES 0.5 (L) 0.8 - 3.5 K/UL    ABS. MONOCYTES 0.6 0.0 - 1.0 K/UL    ABS. EOSINOPHILS 0.0 0.0 - 0.4 K/UL    ABS. BASOPHILS 0.0 0.0 - 0.1 K/UL    ABS. IMM. GRANS. 0.0 0.00 - 0.04 K/UL    DF AUTOMATED     GLUCOSE, POC    Collection Time: 11/11/19  5:33 AM   Result Value Ref Range    Glucose (POC) 125 (H) 65 - 100 mg/dL    Performed by [place], POC    Collection Time: 11/11/19  6:51 AM   Result Value Ref Range    Glucose (POC) 143 (H) 65 - 100 mg/dL    Performed by [place], POC    Collection Time: 11/11/19  7:43 AM   Result Value Ref Range    Glucose (POC) 150 (H) 65 - 100 mg/dL    Performed by [place], POC    Collection Time: 11/11/19 10:57 AM   Result Value Ref Range    Glucose (POC) 162 (H) 65 - 100 mg/dL    Performed by [place] Duff    BLOOD GAS, ARTERIAL    Collection Time: 11/11/19 11:13 AM   Result Value Ref Range    pH 7.49 (H) 7.35 - 7.45      PCO2 29 (L) 35 - 45 mmHg    PO2 183 (H) 75 - 100 mmHg    O2 SAT 100 >95 %    BICARBONATE 24 22 - 26 mmol/L    BASE DEFICIT 0.4 0 - 2 mmol/L    O2 METHOD VENT      FIO2 45.0 %    MODE Assist Control/Volume Control      Tidal volume 450      SET RATE 14      EPAP/CPAP/PEEP 5.0      SITE Right Radial      ALLEN'S TEST PASS         Physical Exam    Neuro Physical Exam      General: intubated      Neurological Exam:   Mental Status: Eyes open, pupils equal and reactive. Does not respond to sternal rub. Not following commands. Mild grimace to pain stimuli in upper ext. No response in lower ext.     Assessment and Plan: Kristin Cooper is a 69 year old woman with history of small cell lung CA  who was found unresposnive. Cause currently not clear. Will order stat MRI brain w/wo contrast to rule out stroke vs brain mets.

## 2019-11-11 NOTE — Progress Notes (Signed)
Comprehensive Nutrition Assessment    Type and Reason for Visit: Initial, Consult, Wound(Tube feeding)    Nutrition Recommendations/Plan:  Initiate TF via NG of Promote at 77ml/hr  Advance by 67ml q4h to goal of 84ml/hr continuous  Add 1 pkt prosource daily (60kcals, 15 g pro)  Free water flushes of 54ml q4h.   Goal feeds provide 1380 kcals, 98g pro, and fluids (meets ~100% needs)   Propofol providing 51kcals additional   D5 providing 408kcals additional --d/c once TF started     Adjust insulin to promote euglycemia  Please document % of all meal/snack consumed    Nutrition Assessment:  Admitted for unresponsiveness, intubated in ED. Pending work-up for cause of episode. Vomited pta, ?aspiration pna. NG in place per EMR, pt remains intubated on no pressors, intermittent low dose propofol. Will place TF order today and f/u for tolerance and need for adjustments. Labs: NA 149, BUN 25, Cr 1.29, BG 121-150, AST 38. Meds: D5 at 114ml/hr, heparin, insulin, levofloxacin, meropenem, antiemetics, PPI, KCL.     Malnutrition Assessment:  Malnutrition Status:  No malnutrition    Context:  Acute illness     Findings of the 6 clinical characteristics of malnutrition:   Energy Intake:  1 - 75% or less of est energy req for 7 or more days(no diet hx to assess, NPO x2 days)  Weight Loss:  Unable to assess     Body Fat Loss:  No significant body fat loss,     Muscle Mass Loss:  No significant muscle mass loss,    Fluid Accumulation:  No significant fluid accumulation,      Estimated Daily Nutrient Needs:  Energy (kcal): 1380kcals (PSU 2003b); Weight Used for Energy Requirements: Current  Protein (g): 97g (1.5g/kg); Weight Used for Protein Requirements: Current  Fluid (ml/day): (adult minimum); Method Used for Fluid Requirements: Other (comment)   Needs for intubation    Nutrition Related Findings:  NFPE finding no acute deficits. No edema. +vomiting pta and in ED. Last BM pta.  Unknown hx dysphagia.      Wounds:    Full thickness, Deep tissue injury(L heel)       Current Nutrition Therapies:  DIET NPO  DIET TUBE FEEDING Promote Promote; 1 pkt prosource daily  Current Tube Feeding (TF) Orders:   ?? Feeding Route: Nasogastric  ?? Goal TF & Flush Orders Provides: Rec'd TF via NG of Promote at goal of 22ml/hr, add 1 pkt prosource daily, flushed with 70ml water q4h; provides 1380kcals, 98g pro, and fluid      Anthropometric Measures:  ?? Height:  5' 0.98" (154.9 cm)  ?? Current Body Wt:  64.5 kg (142 lb 3.2 oz)(1/2) 64.5kg 1/2  ?? Admission Body Wt:  142 lb 3.2 oz(1/2)    ?? Usual Body Wt:  (uta)     ?? Ideal Body Wt:  105 lbs:  135.4 %    ?? BMI Category:  Overweight (BMI 25.0-29.9)     No wt hx to assess per EMR    Nutrition Diagnosis:   ?? Inadequate oral intake related to impaired respiratory function as evidenced by nutrition support-enteral nutrition      Nutrition Interventions:   Food and/or Nutrient Delivery: Continue NPO, Start tube feeding  Nutrition Education and Counseling: No recommendations at this time, Education not appropriate  Coordination of Nutrition Care: Continue to monitor while inpatient    Goals:  intakes >/=75% of EENs in 7 days  Wt maintenance within +/-0.5kg in  7 days       Nutrition Monitoring and Evaluation:   Behavioral-Environmental Outcomes: None identified  Food/Nutrient Intake Outcomes: Enteral nutrition intake/tolerance  Physical Signs/Symptoms Outcomes: Weight, Nausea/vomiting    Discharge Planning:    Too soon to determine     Electronically signed by Lorie Phenix RD on 11/11/2019 at 8:40 AM    Contact: Ext [phone]

## 2019-11-11 NOTE — Consults (Signed)
[facility]  CONSULTATION    Name:  Kristin Cooper, Kristin Cooper  MR#:  [id number]  DOB:  01/16/1951  ACCOUNT #:  [id number]  DATE OF SERVICE:  11/11/2019    ATTENDING PHYSICIAN:  Dr. Michaelle Copas, hospitalist.    REASON FOR CONSULTATION:  Needs ventilator management.    HISTORY OF PRESENT ILLNESS:  The patient is a 69 year old African-American female.  Information is obtained from current medical records.  Case was discussed with nursing staff.  Of note, I was never informed about this consult.  I saw her name on my list this morning.  The patient apparently has a history of lung cancer and had been under chemotherapy.  Apparently, her last chemotherapy was three or four weeks ago.  She also had a history of diabetes mellitus and a prior stroke and left heel ulcer.  The patient apparently moved from [place] to [place] to live with her daughter.  She has still been going to [facility] for her doctor's appointment.  She has been on oral doxycycline for a left heel ulcer.  The patient apparently has had several episodes of unresponsiveness in the last one week.  With this episode, she just stared and did not respond.  It appears that she had another prolonged episode.  She was unresponsive and had an episode of emesis.  EMS was called.  When the paramedics arrived, she had good vital signs.  However, en route, she was intubated for airway protection.  Narcan was given as well.  Also noted was blood sugar of 53.  In the emergency room, she was further stabilized and then was transferred over to the ICU.  This patient has been on just 5 mcg of propofol.  This has been on hold for the last half an hour or so.  She will only open her eyes to stimulation, but does not follow any command.  No significant endotracheal tube secretions.  She has NG tube, which is clamped.  Urine output is reasonable.  Hemodynamically stable.  No further information can be obtained.     PAST MEDICAL HISTORY:  Significant for lung cancer and she has been on chemotherapy, last treatment was three to four weeks ago.  Diabetes mellitus, peripheral vascular disease status post right transmetatarsal amputation, history of stroke, and left heel ulcer.    MEDICATIONS:  Currently, the patient is started on heparin 5000 units subcutaneous q. 8 hourly, Levaquin 500 mg IV q. 48 hours, meropenem 1 g IV q. 12 hourly, Protonix 40 mg IV daily, and other p.r.n. medications.    ALLERGIES:  NO KNOWN DRUG ALLERGIES.    SOCIAL HISTORY:  She apparently lives with her daughter.  Unable to obtain any history of drug, alcohol, tobacco abuse.    Occupational history, family history, and review of systems are unobtainable from the patient, who is intubated and with altered mental status.    PHYSICAL EXAMINATION:  GENERAL:  The patient is elderly appearing African-American female, who is on the ventilator.  VITAL SIGNS:  Temperature is 98.3, pulse is 115 per minute, respiratory rate is 14 per minute, and blood pressure is 143/81.  Saturation is 100%.  HEENT:  Pupils are equal and reactive to light.  No significant pallor or icterus.  Nasal passages are patent.  She has NG tube in the right naris, which is clamped.  She has an orally passed 7.5 endotracheal tube at 22 cm.  No significant oral or endotracheal tube secretions.  NECK:  Supple.  Trachea  is central.  No JVD or lymphadenopathy.  LUNGS:  She is not using any accessory muscles of respiration.  CHEST:  Symmetrical with equal and fair air entry bilaterally.  Coarse breath sounds are noted, but otherwise mostly clear.  HEART:  Rhythm is regular with monitor showing sinus tachycardia.  ABDOMEN:  Obese and benign.  Bowel sounds are audible.  No masses or organomegaly.  EXTREMITIES:  Do not show any cyanosis or clubbing.  She has no pitting edema.  She is status post right metatarsal amputation.  She has ulcer on her left heel, which is bandaged.  Pulses are palpable.   NEUROLOGIC:  Shows that the patient is only opening her eyes to stimulation.  She will not follow any commands.  Not moving her extremities.  SKIN:  Warm and dry.    LABORATORY AND DIAGNOSTIC DATA:  Portable chest x-ray done yesterday on 11/10/2019 shows endotracheal tube to be in good position.  No acute process.  It is not repeated.  There is some prominence of the left hilum.  V/Q scan done on 01/01 showed normal perfusion.  CT of the head and CT of the cervical spine showed no acute process.  Arterial blood gas was done yesterday on 100% ventilator, showed a pH of 7.45, pCO2 of 30 and PAO2 of 248.  Electrolytes are mostly within normal limits, although potassium is low at 3.6.  BUN is 25, creatinine is 1.29 and initially it was 2.01.  Blood glucose en route to the ER was 53 and repeat is 121.  WBC count is 8.9 with neutrophils of 87%.  Initially WBC count was 11.9, hemoglobin is 11, platelet count of 241.  INR is 1.1.  D-dimer 10.6.  BNP is 390, lactic acid 2.7.  Troponin is negative.  Procalcitonin is negative.    ASSESSMENT AND PLAN:   39.  A 69 year old African-American female with multiple medical problems including history of prior stroke and lung cancer who has been on chemotherapy, presented with unresponsiveness.  She was intubated en route for airway protection.  Currently, she is off propofol.  She is minimally responsive, only opening eyes.  Neurology is consulted.  CT of the head and spine did not show any acute process.  Chest x-ray does not show any focal infiltrates.  There is some mild prominence to the left hilum.  Apparently, she has a history of lung cancer.  I will leave her off sedation to assess neurologically.  I will obtain another blood gas now and make further vent changes.  Currently vent settings include a tidal volume of 450/245%,assist control of 14, and PEEP of 5.  Of note, there has been no significant endotracheal tube secretions.  I will start her on nebulizer bronchodilator treatments on a p.r.n. basis.  2.  Altered mental status.  As discussed above, Neurology is consulted.  Hold propofol for further neurologic evaluation.  3.  History of lung cancer.  Details are not available.  Chest x-ray, however, does not show any significant mass or nodule or other chronic changes.  4.  History of stroke.  5.  Diabetes mellitus.  6.  For deep venous thrombosis prophylaxis, the patient is started on heparin subcutaneously, which will be continued.  7.  For gastrointestinal stress prophylaxis, she is started on Protonix IV which will be continued.  8.  Acute renal insufficiency.  I will give her some IV fluids and continue to monitor.  9.  For nutrition, she can be started on Glucerna.  We will get a nutritionist input as well.    Thank you for allowing me to participate in the care of this patient.  I will be happy to talk with the family members when available.  The patient is critically ill.  Approximately 1 hour was spent in the management of this patient.  Prognosis is guarded.  Await further input from Neurology.       Glena Norfolk, MD      [id number]  D:  11/11/2019 9:52  T:  11/11/2019 12:51  JOB #:  [id number]

## 2019-11-11 NOTE — Consults (Addendum)
Full consultation dictated.  [id number]  Patient was intubated because of airway protection.  Altered mental status.  We will hold propofol.  Await neurology input.  History of lung cancer and possible aspiration.  Continue with antibiotics.  Thank you.    Addendum: Discussed with [name] who is the power of attorney, daughter at the bedside in detail.  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  She has a history of tobacco abuse but is not taking any respiratory medications at home.

## 2019-11-11 NOTE — Other (Cosign Needed)
4-eyes completed    Three abrasions on left knee, pressure injury left heel and sacrum, and scabs on right foot.

## 2019-11-11 NOTE — Progress Notes (Signed)
Hospitalist Progress Note    NAME: Kristin Cooper   DOB:  26-Dec-1950   MRN:  [id number]       Subjective:     Chief Complaint / Reason for Physician Visit  Patient seen and evaluated at bedside, patient remains intubated and sedated at this time, overnight events reviewed.  Discussed with RN events overnight.     Review of Systems:  Symptom Y/N Comments  Symptom Y/N Comments   Fever/Chills    Chest Pain     Poor Appetite    Edema     Cough    Abdominal Pain     Sputum    Joint Pain     SOB/DOE    Pruritis/Rash     Nausea/vomit    Tolerating PT/OT     Diarrhea    Tolerating Diet     Constipation    Other       Could NOT obtain due to:  Unable to obtain secondary to patient's clinical status     Objective:     VITALS:   Last 24hrs VS reviewed since prior progress note. Most recent are:  Patient Vitals for the past 24 hrs:   Temp Pulse Resp BP SpO2   11/11/19 0714 ??? ??? ??? ??? 100 %   11/11/19 0709 ??? (!) 115 14 ??? 100 %   11/11/19 0700 98.3 ??F (36.8 ??C) ??? ??? ??? ???   11/11/19 0600 ??? (!) 108 14 (!) 143/81 100 %   11/11/19 0500 ??? (!) 105 14 (!) 142/80 100 %   11/11/19 0411 ??? (!) 101 15 ??? 100 %   11/11/19 0400 ??? (!) 102 14 120/78 100 %   11/11/19 0300 98.7 ??F (37.1 ??C) (!) 104 14 139/84 100 %   11/11/19 0200 ??? (!) 105 14 (!) 148/80 100 %   11/11/19 0100 ??? (!) 106 15 130/81 100 %   11/11/19 0020 98.4 ??F (36.9 ??C) (!) 102 22 (!) 150/76 100 %   11/10/19 2300 99.8 ??F (37.7 ??C) 100 14 132/77 100 %   11/10/19 2248 ??? (!) 101 14 ??? 100 %   11/10/19 2200 99.8 ??F (37.7 ??C) (!) 109 14 135/80 100 %   11/10/19 2000 99.8 ??F (37.7 ??C) (!) 104 12 130/82 100 %   11/10/19 1900 99.8 ??F (37.7 ??C) (!) 104 14 121/85 100 %   11/10/19 1644 100 ??F (37.8 ??C) (!) 106 14 (!) 150/80 100 %   11/10/19 1523 99.9 ??F (37.7 ??C) (!) 102 14 133/81 100 %   11/10/19 1458 ??? ??? 14 ??? ???   11/10/19 1242 98.6 ??F (37 ??C) (!) 114 14 (!) 153/96 100 %   11/10/19 1031 ??? ??? 14 ??? 100 %   11/10/19 0934 ??? (!) 131 20 (!) 183/85 100 %        Intake/Output Summary (Last 24 hours) at 11/11/2019 5784  Last data filed at 11/11/2019 0600  Gross per 24 hour   Intake 2441.92 ml   Output 1100 ml   Net 1341.92 ml        PHYSICAL EXAM:  General: Patient appears comfortable, currently remains intubated and sedated   EENT:  EOMI. Anicteric sclerae. MMM  Resp:  Decreased air entry bilaterally in bilateral lower lung zone  CV:  Regular  rhythm,  No edema  GI:  Soft, Non distended, Non tender.  +Bowel sounds  Neurologic:  Limited Secondary to patient's clinical status  Psych:   Limited secondary to patient's  clinical status  Skin:  No rashes.  No jaundice    Procedures: see electronic medical records for all procedures/Xrays and details which were not copied into this note but were reviewed prior to creation of Plan.      LABS:  I reviewed today's most current labs and imaging studies.  Pertinent labs include:  Recent Labs     11/11/19  0445 11/10/19  1150   WBC 8.9 11.9*   HGB 11.0* 13.6   HCT 34.6* 41.8   PLT 241 305     Recent Labs     11/11/19  0445 11/10/19  1150   NA 149* 147*   K 3.6 4.1   CL 122* 116*   CO2 22 22   GLU 121* 146*   BUN 25* 37*   CREA 1.29* 2.01*   CA 9.1 10.2*   MG  --  2.2   ALB  --  3.8   TBILI  --  1.0   ALT  --  23   INR  --  1.1       Signed: Vonzella Althaus Ellie Lunch, MD    V/Q scan:IMPRESSION  Impression:  Normal perfusion scintigraphy.    CT head without contrast:IMPRESSION  IMPRESSION:   1.  This examination is negative for acute intracranial pathology.  2.  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  3.  There is apparent proptosis more pronounced on the right compared to left.    Xray chest:IMPRESSION  IMPRESSION:  1.  The endotracheal tube tip overlies the tracheal airway approximately 37 mm  above the carina.  2.  The lungs are clear without acute pulmonary parenchymal pathology.    ??    Reviewed most current lab test results and cultures  YES   Reviewed most current radiology test results   YES  Review and summation of old records today    NO  Reviewed patient's current orders and MAR    YES  PMH/SH reviewed - no change compared to H&P      Assessment / Plan:  Altered mental status???of note patient presented with altered mental status due to unclear etiology, could be secondary to hypoglycemia versus seizure activity versus metabolic encephalopathy, patient was seizure-free overnight, patient CT head was negative for any acute intracranial pathologies  Continue to monitor mental status  Audiovisual EEG  Follow-up MRI brain  Follow-up neurology consult    Acute respiratory failure/aspiration pneumonia???resented with above-mentioned symptomatology and was emergently intubated secondary to airway protection, with significant concerns for aspiration pneumonia, patient does not meet sepsis criteria  Follow-up blood cultures  Follow-up sputum cultures  Meropenem and Levaquin for antibiotic coverage  Follow-up pulmonology consult    Diabetes???continue insulin sliding scale    Prophylaxis???Heparin subcu tonics, chlorhexidine mouthwash, Protonix once daily  Critical care time spent 50 minutes involving direct patient care as well as reviewing patient's labs and coordination of care with nursing staff      18.5 - 24.9 Normal weight / Body mass index is 26.87 kg/m??.    Code status: Full  Prophylaxis: Lovenox  Recommended Disposition: [facility]/Rehab     ________________________________________________________________________  Care Plan discussed with:    Comments   Patient     Family  X    RN x    Care Manager X    Consultant  X  x Multidiciplinary team rounds were held today with case manager, nursing, pharmacist and clinical coordinator.  Patient's plan of care was discussed; medications were reviewed and discharge planning was addressed.     ________________________________________________________________________     Total CRITICAL CARE TIME Spent: 50   Minutes non procedure based      Comments   >50% of visit spent in counseling and coordination of care X    ________________________________________________________________________  Alisia Ferrari, MD

## 2019-11-11 NOTE — Consults (Signed)
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 She had moved here from [place] to take care of her daughter but was still getting medial care in [place]. Family says she had a few episodes of loss of consciousness as well as staring last week. Yesterday she became unresponsive with an episode of emesis and did not wake up. CT head was unrevealing. Labs WNL except mild leukocytosis which since resolved. Vitals stable. No family at bedside and all history obtained from chart.     Subjective     Past Medical History:   Diagnosis Date   ??? Lung cancer (HCC)       No past surgical history on file.  No family history on file.   Social History     Tobacco Use   ??? Smoking status: Not on file   Substance Use Topics   ??? Alcohol use: Not on file       Current Facility-Administered Medications   Medication Dose Route Frequency Provider Last Rate Last Admin   ??? dextrose 5% lactated ringers infusion  100 mL/hr IntraVENous CONTINUOUS Diona Browner, PA-C 100 mL/hr at 11/11/19 0345 100 mL/hr at 11/11/19 0345   ??? potassium chloride 10 mEq in 100 ml IVPB  10 mEq IntraVENous Q1H Kardar, Salvadore Oxford, MD 100 mL/hr at 11/11/19 1202 10 mEq at 11/11/19 1202   ??? meropenem (MERREM) 1 g in sterile water (preservative free) 20 mL IV syringe  1 g IntraVENous Q12H Kardar, Ahmed Marin Olp, MD   1 g at 11/11/19 5638   ??? pantoprazole (PROTONIX) 40 mg in 0.9% sodium chloride 10 mL injection  40 mg IntraVENous DAILY Kardar, Ahmed Marin Olp, MD   40 mg at 11/11/19 9373   ??? chlorhexidine (PERIDEX) 0.12 % mouthwash 15 mL  15 mL Oral Q12H Kardar, Ahmed Marin Olp, MD   15 mL at 11/11/19 0928   ??? [START ON 11/12/2019] levoFLOXacin (LEVAQUIN) 500 mg in D5W IVPB  500 mg IntraVENous Q48H Kardar, Ahmed Marin Olp, MD       ??? albuterol-ipratropium (DUO-NEB) 2.5 MG-0.5 MG/3 ML  3 mL Nebulization Q6H PRN Mughal, Zahid, MD        ??? propofol (DIPRIVAN) 10 mg/mL infusion  0-50 mcg/kg/min (Order-Specific) IntraVENous TITRATE Forde Radon, MD 2.7 mL/hr at 11/10/19 2344 5 mcg/kg/min at 11/10/19 2344   ??? glucose chewable tablet 16 g  4 Tab Oral PRN Bishop, Krysten, PA-C       ??? dextrose (D50W) injection syrg 12.5-25 g  25-50 mL IntraVENous PRN Bishop, Krysten, PA-C   25 g at 11/11/19 0133   ??? glucagon (GLUCAGEN) injection 1 mg  1 mg IntraMUSCular PRN Bishop, Krysten, PA-C       ??? insulin lispro (HUMALOG) injection   SubCUTAneous AC&HS Bishop, Krysten, PA-C   2 Units at 11/11/19 1200   ??? metoprolol (LOPRESSOR) injection 5 mg  5 mg IntraVENous Q6H PRN Bishop, Krysten, PA-C       ??? sodium chloride (NS) flush 5-40 mL  5-40 mL IntraVENous Q8H Bishop, Krysten, PA-C   Stopped at 11/11/19 0600   ??? sodium chloride (NS) flush 5-40 mL  5-40 mL IntraVENous PRN Bishop, Krysten, PA-C   10 mL at 11/11/19 0134   ??? acetaminophen (TYLENOL) tablet 650 mg  650 mg Oral Q6H PRN Margie Ege, PA-C  Or   ??? acetaminophen (TYLENOL) suppository 650 mg  650 mg Rectal Q6H PRN Margie Ege, PA-C       ??? polyethylene glycol (MIRALAX) packet 17 g  17 g Oral DAILY PRN Bishop, Krysten, PA-C       ??? ondansetron (ZOFRAN ODT) tablet 4 mg  4 mg Oral Q8H PRN Bishop, Krysten, PA-C        Or   ??? ondansetron (ZOFRAN) injection 4 mg  4 mg IntraVENous Q6H PRN Bishop, Krysten, PA-C   4 mg at 11/10/19 2346   ??? heparin (porcine) injection 5,000 Units  5,000 Units SubCUTAneous Q8H Bishop, Krysten, PA-C   5,000 Units at 11/11/19 5631        Review of Systems   Unable to perform ROS: Intubated       Objective     Vital signs for last 24 hours:  Visit Vitals  BP (!) 143/81   Pulse (!) 118   Temp 98.2 ??F (36.8 ??C)   Resp 15   Ht 5' 0.98" (1.549 m)   Wt 64.5 kg (142 lb 3.2 oz)   SpO2 100%   BMI 26.88 kg/m??       Intake/Output this shift:  Current Shift: No intake/output data recorded.  Last 3 Shifts: 12/31 1901 - 01/02 0700  In: 2441.9 [I.V.:2441.9]  Out: 1100  [Urine:1100]    Data Review:   Recent Results (from the past 24 hour(s))   URINALYSIS W/ REFLEX CULTURE    Collection Time: 11/10/19 12:44 PM    Specimen: Urine   Result Value Ref Range    Color Yellow/Straw      Appearance Clear Clear      Specific gravity 1.014 1.003 - 1.030      pH (UA) 5.0 5.0 - 8.0      Protein 100 (A) Negative mg/dL    Glucose 50 (A) Negative mg/dL    Ketone 5 (A) Negative mg/dL    Bilirubin Negative Negative      Blood Small (A) Negative      Urobilinogen 0.1 0.1 - 1.0 EU/dL    Nitrites Negative Negative      Leukocyte Esterase Negative Negative      UA:UC IF INDICATED Culture not indicated by UA result Culture not indicated by UA result      WBC 0-4 0 - 4 /hpf    RBC 0-5 0 - 5 /hpf    Bacteria Negative Negative /hpf   BLOOD GAS, ARTERIAL    Collection Time: 11/10/19 12:55 PM   Result Value Ref Range    pH 7.45 7.35 - 7.45      PCO2 30 (L) 35 - 45 mmHg    PO2 248 (H) 75 - 100 mmHg    O2 SAT 100 >95 %    BICARBONATE 22 22 - 26 mmol/L    BASE DEFICIT 2.6 (H) 0 - 2 mmol/L    O2 METHOD VENT      FIO2 100.0 %    MODE Assist Control/Volume Control      Tidal volume 450      SET RATE 14      EPAP/CPAP/PEEP 5.0      SITE Right Radial      ALLEN'S TEST PASS     DUPLEX CAROTID BILATERAL    Collection Time: 11/10/19  3:10 PM   Result Value Ref Range    Left CCA dist sys -77.9 cm/s    Left CCA dist dias -20.6 cm/s  Left CCA prox sys 89.4 cm/s    Left CCA prox dias 17.6 cm/s    Left ICA dist sys -94.9 cm/s    Left ICA dist dias -33.5 cm/s    Left ICA prox sys 186.0 cm/s    Left ICA prox dias 40.8 cm/s    Left ECA sys -217.0 cm/s    LEFT EXTERNAL CAROTID ARTERY D 0.00 cm/s    Left subclavian sys 113.0 cm/s    LEFT SUBCLAVIAN ARTERY D 0.00 cm/s    Left vertebral sys -34.7 cm/s    LEFT VERTEBRAL ARTERY D -6.38 cm/s    Right cca dist sys -82.5 cm/s    Right CCA dist dias -16.5 cm/s    Right CCA prox sys 92.8 cm/s    Right CCA prox dias 16.5 cm/s    Right ICA dist sys -89.3 cm/s    Right ICA dist dias -25.9  cm/s    Right ICA prox sys 71.8 cm/s    Right ICA prox dias 12.1 cm/s    Right eca sys -131.0 cm/s    RIGHT EXTERNAL CAROTID ARTERY D -0.76 cm/s    Right subclavian sys 83.2 cm/s    RIGHT SUBCLAVIAN ARTERY D 0.00 cm/s    Right vertebral sys 48.3 cm/s    RIGHT VERTEBRAL ARTERY D 12.40 cm/s   GLUCOSE, POC    Collection Time: 11/10/19 11:08 PM   Result Value Ref Range    Glucose (POC) 92 65 - 100 mg/dL    Performed by [facility]    MRSA SCREEN - PCR (NASAL)    Collection Time: 11/11/19  1:00 AM   Result Value Ref Range    MRSA by PCR, Nasal Not Detected Not Detected     GLUCOSE, POC    Collection Time: 11/11/19  1:29 AM   Result Value Ref Range    Glucose (POC) 48 (LL) 65 - 100 mg/dL    Performed by [facility]    GLUCOSE, POC    Collection Time: 11/11/19  1:49 AM   Result Value Ref Range    Glucose (POC) 242 (H) 65 - 100 mg/dL    Performed by [facility] CHARITY    GLUCOSE, POC    Collection Time: 11/11/19  3:10 AM   Result Value Ref Range    Glucose (POC) 147 (H) 65 - 100 mg/dL    Performed by [facility] CHARITY    GLUCOSE, POC    Collection Time: 11/11/19  4:14 AM   Result Value Ref Range    Glucose (POC) 73 65 - 100 mg/dL    Performed by [facility]ITY    METABOLIC PANEL, BASIC    Collection Time: 11/11/19  4:45 AM   Result Value Ref Range    Sodium 149 (H) 136 - 145 mmol/L    Potassium 3.6 3.5 - 5.1 mmol/L    Chloride 122 (H) 97 - 108 mmol/L    CO2 22 21 - 32 mmol/L    Anion gap 5 5 - 15 mmol/L    Glucose 121 (H) 65 - 100 mg/dL    BUN 25 (H) 6 - 20 mg/dL    Creatinine 1.30 (H) 0.55 - 1.02 mg/dL    BUN/Creatinine ratio 19 12 - 20      GFR est AA 50 (L) >60 ml/min/1.63m2    GFR est non-AA 41 (L) >60 ml/min/1.84m2    Calcium 9.1 8.5 - 10.1 mg/dL   CBC WITH AUTOMATED DIFF    Collection Time: 11/11/19  4:45 AM   Result Value Ref Range    WBC 8.9 3.6 - 11.0 K/uL    RBC 3.69 (L) 3.80 - 5.20 M/uL    HGB 11.0 (L) 11.5 - 16.0 g/dL    HCT 16.134.6 (L) 09.635.0 - 47.0 %    MCV 93.8 80.0 - 99.0 FL    MCH 29.8 26.0 - 34.0 PG    MCHC 31.8  30.0 - 36.5 g/dL    RDW 04.514.4 40.911.5 - 81.114.5 %    PLATELET 241 150 - 400 K/uL    MPV 9.9 8.9 - 12.9 FL    NEUTROPHILS 87 (H) 32 - 75 %    LYMPHOCYTES 6 (L) 12 - 49 %    MONOCYTES 7 5 - 13 %    EOSINOPHILS 0 0 - 7 %    BASOPHILS 0 0 - 1 %    IMMATURE GRANULOCYTES 0 0.0 - 0.5 %    ABS. NEUTROPHILS 7.8 1.8 - 8.0 K/UL    ABS. LYMPHOCYTES 0.5 (L) 0.8 - 3.5 K/UL    ABS. MONOCYTES 0.6 0.0 - 1.0 K/UL    ABS. EOSINOPHILS 0.0 0.0 - 0.4 K/UL    ABS. BASOPHILS 0.0 0.0 - 0.1 K/UL    ABS. IMM. GRANS. 0.0 0.00 - 0.04 K/UL    DF AUTOMATED     GLUCOSE, POC    Collection Time: 11/11/19  5:33 AM   Result Value Ref Range    Glucose (POC) 125 (H) 65 - 100 mg/dL    [facility]    GLUCOSE, POC    Collection Time: 11/11/19  6:51 AM   Result Value Ref Range    Glucose (POC) 143 (H) 65 - 100 mg/dL    [facility]formed by [facility]    GLUCOSE, POC    Collection Time: 11/11/19  7:43 AM   Result Value Ref Range    Glucose (POC) 150 (H) 65 - 100 mg/dL    Performed by [facility]    GLUCOSE, POC    Collection Time: 11/11/19 10:57 AM   Result Value Ref Range    Glucose (POC) 162 (H) 65 - 100 mg/dL    Performed by [facility]AC[facility]    BLOOD GAS, ARTERIAL    Collection Time: 11/11/19 11:13 AM   Result Value Ref Range    pH 7.49 (H) 7.35 - 7.45      PCO2 29 (L) 35 - 45 mmHg    PO2 183 (H) 75 - 100 mmHg    O2 SAT 100 >95 %    BICARBONATE 24 22 - 26 mmol/L    BASE DEFICIT 0.4 0 - 2 mmol/L    O2 METHOD VENT      FIO2 45.0 %    MODE Assist Control/Volume Control      Tidal volume 450      SET RATE 14      EPAP/CPAP/PEEP 5.0      SITE Right Radial      ALLEN'S TEST PASS         Physical Exam    Neuro Physical Exam      General: intubated      Neurological Exam:  Mental Status: Eyes open, pupils equal and reactive. Does not respond to sternal rub. Not following commands. Mild grimace to pain stimuli in upper ext. No response in lower ext.     Assessment and Plan: Ms. Kristin Cooper is a 69 year old woman with history of small cell lung CA who  was  found unresposnive. Cause currently not clear. Will order stat MRI brain w/wo contrast to rule out stroke vs brain mets.

## 2019-11-11 NOTE — Progress Notes (Signed)
Hospitalist Progress Note    NAME: Kristin Cooper   DOB:  1950/12/07   MRN:  [id number]       Subjective:     Chief Complaint / Reason for Physician Visit  Patient seen and evaluated at bedside, patient remains intubated and sedated at this time, overnight events reviewed.  Discussed with RN events overnight.     Review of Systems:  Symptom Y/N Comments  Symptom Y/N Comments   Fever/Chills    Chest Pain     Poor Appetite    Edema     Cough    Abdominal Pain     Sputum    Joint Pain     SOB/DOE    Pruritis/Rash     Nausea/vomit    Tolerating PT/OT     Diarrhea    Tolerating Diet     Constipation    Other       Could NOT obtain due to:  Unable to obtain secondary to patient's clinical status     Objective:     VITALS:   Last 24hrs VS reviewed since prior progress note. Most recent are:  Patient Vitals for the past 24 hrs:   Temp Pulse Resp BP SpO2   11/11/19 0714 ??? ??? ??? ??? 100 %   11/11/19 0709 ??? (!) 115 14 ??? 100 %   11/11/19 0700 98.3 ??F (36.8 ??C) ??? ??? ??? ???   11/11/19 0600 ??? (!) 108 14 (!) 143/81 100 %   11/11/19 0500 ??? (!) 105 14 (!) 142/80 100 %   11/11/19 0411 ??? (!) 101 15 ??? 100 %   11/11/19 0400 ??? (!) 102 14 120/78 100 %   11/11/19 0300 98.7 ??F (37.1 ??C) (!) 104 14 139/84 100 %   11/11/19 0200 ??? (!) 105 14 (!) 148/80 100 %   11/11/19 0100 ??? (!) 106 15 130/81 100 %   11/11/19 0020 98.4 ??F (36.9 ??C) (!) 102 22 (!) 150/76 100 %   11/10/19 2300 99.8 ??F (37.7 ??C) 100 14 132/77 100 %   11/10/19 2248 ??? (!) 101 14 ??? 100 %   11/10/19 2200 99.8 ??F (37.7 ??C) (!) 109 14 135/80 100 %   11/10/19 2000 99.8 ??F (37.7 ??C) (!) 104 12 130/82 100 %   11/10/19 1900 99.8 ??F (37.7 ??C) (!) 104 14 121/85 100 %   11/10/19 1644 100 ??F (37.8 ??C) (!) 106 14 (!) 150/80 100 %   11/10/19 1523 99.9 ??F (37.7 ??C) (!) 102 14 133/81 100 %   11/10/19 1458 ??? ??? 14 ??? ???   11/10/19 1242 98.6 ??F (37 ??C) (!) 114 14 (!) 153/96 100 %   11/10/19 1031 ??? ??? 14 ??? 100 %   11/10/19 0934 ??? (!) 131 20 (!) 183/85 100 %       Intake/Output Summary (Last 24 hours) at 11/11/2019  8466  Last data filed at 11/11/2019 0600  Gross per 24 hour   Intake 2441.92 ml   Output 1100 ml   Net 1341.92 ml        PHYSICAL EXAM:  General: Patient appears comfortable, currently remains intubated and sedated   EENT:  EOMI. Anicteric sclerae. MMM  Resp:  Decreased air entry bilaterally in bilateral lower lung zone  CV:  Regular  rhythm,  No edema  GI:  Soft, Non distended, Non tender.  +Bowel sounds  Neurologic:  Limited Secondary to patient's clinical status  Psych:   Limited secondary to patient's  clinical status  Skin:  No rashes.  No jaundice    Procedures: see electronic medical records for all procedures/Xrays and details which were not copied into this note but were reviewed prior to creation of Plan.      LABS:  I reviewed today's most current labs and imaging studies.  Pertinent labs include:  Recent Labs     11/11/19  0445 11/10/19  1150   WBC 8.9 11.9*   HGB 11.0* 13.6   HCT 34.6* 41.8   PLT 241 305     Recent Labs     11/11/19  0445 11/10/19  1150   NA 149* 147*   K 3.6 4.1   CL 122* 116*   CO2 22 22   GLU 121* 146*   BUN 25* 37*   CREA 1.29* 2.01*   CA 9.1 10.2*   MG  --  2.2   ALB  --  3.8   TBILI  --  1.0   ALT  --  23   INR  --  1.1       Signed: Janeisha Ryle Ellie Lunch, MD    V/Q scan:IMPRESSION  Impression:  Normal perfusion scintigraphy.    CT head without contrast:IMPRESSION  IMPRESSION:   1.  This examination is negative for acute intracranial pathology.  2.  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  3.  There is apparent proptosis more pronounced on the right compared to left.    Xray chest:IMPRESSION  IMPRESSION:  1.  The endotracheal tube tip overlies the tracheal airway approximately 37 mm  above the carina.  2.  The lungs are clear without acute pulmonary parenchymal pathology.    ??    Reviewed most current lab test results and cultures  YES  Reviewed most current radiology test results   YES  Review and summation  of old records today    NO  Reviewed patient's current orders and MAR    YES  PMH/SH reviewed - no change compared to H&P      Assessment / Plan:  Altered mental status???of note patient presented with altered mental status due to unclear etiology, could be secondary to hypoglycemia versus seizure activity versus metabolic encephalopathy, patient was seizure-free overnight, patient CT head was negative for any acute intracranial pathologies  Continue to monitor mental status  Audiovisual EEG  Follow-up MRI brain  Follow-up neurology consult    Acute respiratory failure/aspiration pneumonia???resented with above-mentioned symptomatology and was emergently intubated secondary to airway protection, with significant concerns for aspiration pneumonia, patient does not meet sepsis criteria  Follow-up blood cultures  Follow-up sputum cultures  Meropenem and Levaquin for antibiotic coverage  Follow-up pulmonology consult    Diabetes???continue insulin sliding scale    Prophylaxis???Heparin subcu tonics, chlorhexidine mouthwash, Protonix once daily  Critical care time spent 50 minutes involving direct patient care as well as reviewing patient's labs and coordination of care with nursing staff      18.5 - 24.9 Normal weight / Body mass index is 26.87 kg/m??.    Code status: Full  Prophylaxis: Lovenox  Recommended Disposition: SAH/Rehab     ________________________________________________________________________  Care Plan discussed with:    Comments   Patient     Family  X    RN x    Care Manager X    Consultant  X  x Multidiciplinary team rounds were held today with case manager, nursing, pharmacist and clinical coordinator.  Patient's plan of care was discussed; medications were reviewed and discharge planning was addressed.     ________________________________________________________________________    Total CRITICAL CARE TIME Spent: 50   Minutes non procedure based      Comments   >50% of visit spent in  counseling and coordination of care X    ________________________________________________________________________  Alisia Ferrari, MD

## 2019-11-11 NOTE — Progress Notes (Signed)
 Comprehensive Nutrition Assessment    Type and Reason for Visit: Initial, Consult, Wound(Tube feeding)    Nutrition Recommendations/Plan:  Initiate TF via NG of Promote at 20ml/hr  Advance by 10ml q4h to goal of 52ml/hr continuous  Add 1 pkt prosource daily (60kcals, 15 g pro)  Free water flushes of 75ml q4h.   Goal feeds provide 1380 kcals, 98g pro, and fluids (meets ~100% needs)   Propofol providing 51kcals additional   D5 providing 408kcals additional --d/c once TF started     Adjust insulin to promote euglycemia  Please document % of all meal/snack consumed    Nutrition Assessment:  Admitted for unresponsiveness, intubated in ED. Pending work-up for cause of episode. Vomited pta, ?aspiration pna. NG in place per EMR, pt remains intubated on no pressors, intermittent low dose propofol. Will place TF order today and f/u for tolerance and need for adjustments. Labs: NA 149, BUN 25, Cr 1.29, BG 121-150, AST 38. Meds: D5 at 100ml/hr, heparin, insulin, levofloxacin, meropenem, antiemetics, PPI, KCL.     Malnutrition Assessment:  Malnutrition Status:  No malnutrition    Context:  Acute illness     Findings of the 6 clinical characteristics of malnutrition:   Energy Intake:  1 - 75% or less of est energy req for 7 or more days(no diet hx to assess, NPO x2 days)  Weight Loss:  Unable to assess     Body Fat Loss:  No significant body fat loss,     Muscle Mass Loss:  No significant muscle mass loss,    Fluid Accumulation:  No significant fluid accumulation,      Estimated Daily Nutrient Needs:  Energy (kcal): 1380kcals (PSU 2003b); Weight Used for Energy Requirements: Current  Protein (g): 97g (1.5g/kg); Weight Used for Protein Requirements: Current  Fluid (ml/day): (adult minimum); Method Used for Fluid Requirements: Other (comment)   Needs for intubation    Nutrition Related Findings:  NFPE finding no acute deficits. No edema. +vomiting pta and in ED. Last BM pta.  Unknown hx dysphagia.      Wounds:   Full  thickness, Deep tissue injury(L heel)       Current Nutrition Therapies:  DIET NPO  DIET TUBE FEEDING Promote Promote; 1 pkt prosource daily  Current Tube Feeding (TF) Orders:    Feeding Route: Nasogastric   Goal TF & Flush Orders Provides: Rec'd TF via NG of Promote at goal of 8ml/hr, add 1 pkt prosource daily, flushed with 75ml water q4h; provides 1380kcals, 98g pro, and fluid      Anthropometric Measures:   Height:  5' 0.98 (154.9 cm)   Current Body Wt:  64.5 kg (142 lb 3.2 oz)(1/2) 64.5kg 1/2   Admission Body Wt:  142 lb 3.2 oz(1/2)     Usual Body Wt:  (uta)      Ideal Body Wt:  105 lbs:  135.4 %     BMI Category:  Overweight (BMI 25.0-29.9)     No wt hx to assess per EMR    Nutrition Diagnosis:    Inadequate oral intake related to impaired respiratory function as evidenced by nutrition support-enteral nutrition      Nutrition Interventions:   Food and/or Nutrient Delivery: Continue NPO, Start tube feeding  Nutrition Education and Counseling: No recommendations at this time, Education not appropriate  Coordination of Nutrition Care: Continue to monitor while inpatient    Goals:  intakes >/=75% of EENs in 7 days  Wt maintenance within +/-0.5kg in  7 days       Nutrition Monitoring and Evaluation:   Behavioral-Environmental Outcomes: None identified  Food/Nutrient Intake Outcomes: Enteral nutrition intake/tolerance  Physical Signs/Symptoms Outcomes: Weight, Nausea/vomiting    Discharge Planning:    Too soon to determine     Electronically signed by Hubert Bowman RD on 11/11/2019 at 8:40 AM    Contact: Ext [phone]

## 2019-11-11 NOTE — Consults (Signed)
Full consultation dictated.  [id number]  Patient was intubated because of airway protection.  Altered mental status.  We will hold propofol.  Await neurology input.  History of lung cancer and possible aspiration.  Continue with antibiotics.  Thank you.    Addendum: Discussed with [name] who is the power of attorney, daughter at the bedside in detail.  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  She has a history of tobacco abuse but is not taking any respiratory medications at home.

## 2019-11-12 ENCOUNTER — Inpatient Hospital Stay: Admit: 2019-11-12 | Payer: MEDICARE | Primary: Adult Health

## 2019-11-12 LAB — GLUCOSE, POC
Glucose (POC): 326 mg/dL — ABNORMAL HIGH (ref 65–100)
Glucose (POC): 150 mg/dL — ABNORMAL HIGH (ref 65–100)
Glucose (POC): 143 mg/dL — ABNORMAL HIGH (ref 65–100)
Glucose (POC): 128 mg/dL — ABNORMAL HIGH (ref 65–100)
Glucose (POC): 188 mg/dL — ABNORMAL HIGH (ref 65–100)
Glucose (POC): 186 mg/dL — ABNORMAL HIGH (ref 65–100)
Glucose (POC): 238 mg/dL — ABNORMAL HIGH (ref 65–100)

## 2019-11-12 LAB — METABOLIC PANEL, COMPREHENSIVE
A-G Ratio: 0.6 — ABNORMAL LOW (ref 1.1–2.2)
ALT (SGPT): 14 U/L (ref 12–78)
AST (SGOT): 22 U/L (ref 15–37)
Albumin: 2.5 g/dL — ABNORMAL LOW (ref 3.5–5.0)
Alk. phosphatase: 79 U/L (ref 45–117)
Anion gap: 5 mmol/L (ref 5–15)
BUN/Creatinine ratio: 14 (ref 12–20)
BUN: 14 mg/dL (ref 6–20)
Bilirubin, total: 0.5 mg/dL (ref 0.2–1.0)
CO2: 24 mmol/L (ref 21–32)
Calcium: 9.4 mg/dL (ref 8.5–10.1)
Chloride: 114 mmol/L — ABNORMAL HIGH (ref 97–108)
Creatinine: 0.98 mg/dL (ref 0.55–1.02)
GFR est AA: >60 mL/min/{1.73_m2} (ref >60)
GFR est non-AA: 57 mL/min/{1.73_m2} — ABNORMAL LOW (ref >60)
Globulin: 3.9 g/dL (ref 2.0–4.0)
Glucose: 134 mg/dL — ABNORMAL HIGH (ref 65–100)
Potassium: 3.6 mmol/L (ref 3.5–5.1)
Protein, total: 6.4 g/dL (ref 6.4–8.2)
Sodium: 143 mmol/L (ref 136–145)

## 2019-11-12 LAB — BLOOD GAS, ARTERIAL
BASE EXCESS: 0.7 mmol/L (ref 0–2)
BICARBONATE: 25 mmol/L (ref 22–26)
Base Excess: 0.7 mmol/L (ref 0–2)
EPAP/CPAP/PEEP: 5
EPAP/CPAP/PEEP: 5
FIO2: 35 %
FIO2: 35 %
HCO3: 25 mmol/L (ref 22–26)
IPAP/PIP: 0
IPAP/PIP: 0
O2 SAT: 100 % (ref >95)
O2 Sat: 100 % (ref >95)
PCO2: 32 mmHg — ABNORMAL LOW (ref 35–45)
PCO2: 32 mmHg — ABNORMAL LOW (ref 35–45)
PO2: 105 mmHg — ABNORMAL HIGH (ref 75–100)
PO2: 105 mmHg — ABNORMAL HIGH (ref 75–100)
SET RATE: 12
Set Rate: 12
Tidal Volume: 450
Tidal volume: 450
pH: 7.49 — ABNORMAL HIGH (ref 7.35–7.45)
pH: 7.49 — ABNORMAL HIGH (ref 7.35–7.45)

## 2019-11-12 LAB — CBC W/O DIFF
HCT: 34.1 % — ABNORMAL LOW (ref 35.0–47.0)
HGB: 11 g/dL — ABNORMAL LOW (ref 11.5–16.0)
MCH: 29.9 PG (ref 26.0–34.0)
MCHC: 32.3 g/dL (ref 30.0–36.5)
MCV: 92.7 FL (ref 80.0–99.0)
MPV: 10.1 FL (ref 8.9–12.9)
PLATELET: 242 10*3/uL (ref 150–400)
RBC: 3.68 M/uL — ABNORMAL LOW (ref 3.80–5.20)
RDW: 14.3 % (ref 11.5–14.5)
WBC: 8.6 10*3/uL (ref 3.6–11.0)

## 2019-11-12 LAB — METABOLIC PANEL, BASIC
Anion gap: 6 mmol/L (ref 5–15)
BUN/Creatinine ratio: 14 (ref 12–20)
BUN: 14 mg/dL (ref 6–20)
CO2: 24 mmol/L (ref 21–32)
Calcium: 9.4 mg/dL (ref 8.5–10.1)
Chloride: 114 mmol/L — ABNORMAL HIGH (ref 97–108)
Creatinine: 0.98 mg/dL (ref 0.55–1.02)
GFR est AA: >60 mL/min/{1.73_m2} (ref >60)
GFR est non-AA: 57 mL/min/{1.73_m2} — ABNORMAL LOW (ref >60)
Glucose: 130 mg/dL — ABNORMAL HIGH (ref 65–100)
Potassium: 3.5 mmol/L (ref 3.5–5.1)
Sodium: 144 mmol/L (ref 136–145)

## 2019-11-12 LAB — MAGNESIUM
Magnesium: 1.5 mg/dL — ABNORMAL LOW (ref 1.6–2.4)
Magnesium: 1.5 mg/dL — ABNORMAL LOW (ref 1.6–2.4)

## 2019-11-12 LAB — COMPREHENSIVE METABOLIC PANEL
ALT: 14 U/L (ref 12–78)
AST: 22 U/L (ref 15–37)
Albumin/Globulin Ratio: 0.6 — ABNORMAL LOW (ref 1.1–2.2)
Albumin: 2.5 g/dL — ABNORMAL LOW (ref 3.5–5.0)
Alkaline Phosphatase: 79 U/L (ref 45–117)
Anion Gap: 5 mmol/L (ref 5–15)
BUN: 14 mg/dL (ref 6–20)
Bun/Cre Ratio: 14 (ref 12–20)
CO2: 24 mmol/L (ref 21–32)
Calcium: 9.4 mg/dL (ref 8.5–10.1)
Chloride: 114 mmol/L — ABNORMAL HIGH (ref 97–108)
Creatinine: 0.98 mg/dL (ref 0.55–1.02)
EGFR IF NonAfrican American: 57 mL/min/{1.73_m2} — ABNORMAL LOW (ref >60)
GFR African American: >60 mL/min/{1.73_m2} (ref >60)
Globulin: 3.9 g/dL (ref 2.0–4.0)
Glucose: 134 mg/dL — ABNORMAL HIGH (ref 65–100)
Potassium: 3.6 mmol/L (ref 3.5–5.1)
Sodium: 143 mmol/L (ref 136–145)
Total Bilirubin: 0.5 mg/dL (ref 0.2–1.0)
Total Protein: 6.4 g/dL (ref 6.4–8.2)

## 2019-11-12 LAB — POCT GLUCOSE
POC Glucose: 326 mg/dL — ABNORMAL HIGH (ref 65–100)
POC Glucose: 150 mg/dL — ABNORMAL HIGH (ref 65–100)
POC Glucose: 143 mg/dL — ABNORMAL HIGH (ref 65–100)
POC Glucose: 128 mg/dL — ABNORMAL HIGH (ref 65–100)
POC Glucose: 188 mg/dL — ABNORMAL HIGH (ref 65–100)
POC Glucose: 186 mg/dL — ABNORMAL HIGH (ref 65–100)
POC Glucose: 238 mg/dL — ABNORMAL HIGH (ref 65–100)

## 2019-11-12 LAB — CBC
Hematocrit: 34.1 % — ABNORMAL LOW (ref 35.0–47.0)
Hemoglobin: 11 g/dL — ABNORMAL LOW (ref 11.5–16.0)
MCH: 29.9 PG (ref 26.0–34.0)
MCHC: 32.3 g/dL (ref 30.0–36.5)
MCV: 92.7 FL (ref 80.0–99.0)
MPV: 10.1 FL (ref 8.9–12.9)
Platelets: 242 10*3/uL (ref 150–400)
RBC: 3.68 M/uL — ABNORMAL LOW (ref 3.80–5.20)
RDW: 14.3 % (ref 11.5–14.5)
WBC: 8.6 10*3/uL (ref 3.6–11.0)

## 2019-11-12 LAB — BASIC METABOLIC PANEL
Anion Gap: 6 mmol/L (ref 5–15)
BUN: 14 mg/dL (ref 6–20)
Bun/Cre Ratio: 14 (ref 12–20)
CO2: 24 mmol/L (ref 21–32)
Calcium: 9.4 mg/dL (ref 8.5–10.1)
Chloride: 114 mmol/L — ABNORMAL HIGH (ref 97–108)
Creatinine: 0.98 mg/dL (ref 0.55–1.02)
EGFR IF NonAfrican American: 57 mL/min/{1.73_m2} — ABNORMAL LOW (ref >60)
GFR African American: >60 mL/min/{1.73_m2} (ref >60)
Glucose: 130 mg/dL — ABNORMAL HIGH (ref 65–100)
Potassium: 3.5 mmol/L (ref 3.5–5.1)
Sodium: 144 mmol/L (ref 136–145)

## 2019-11-12 MED ORDER — ASPIRIN 81 MG TAB, DELAYED RELEASE
81 mg | Freq: Every day | ORAL | Status: DC
Start: 2019-11-12 — End: 2019-11-16
  Administered 2019-11-13 – 2019-11-16 (×4): via ORAL

## 2019-11-12 MED ORDER — IOPAMIDOL 76 % IV SOLN
370 mg iodine /mL (76 %) | Freq: Once | INTRAVENOUS | Status: AC
Start: 2019-11-12 — End: 2019-11-12
  Administered 2019-11-12: 20:00:00 via INTRAVENOUS

## 2019-11-12 MED ORDER — ATORVASTATIN 40 MG TAB
40 mg | Freq: Every day | ORAL | Status: DC
Start: 2019-11-12 — End: 2019-11-16
  Administered 2019-11-13 – 2019-11-16 (×4): via ORAL

## 2019-11-12 MED ORDER — POTASSIUM CHLORIDE 10 MEQ/100 ML IV PIGGY BACK
10 mEq/0 mL | INTRAVENOUS | Status: AC
Start: 2019-11-12 — End: 2019-11-12
  Administered 2019-11-12 (×4): via INTRAVENOUS

## 2019-11-12 MED FILL — HUMALOG U-100 INSULIN 100 UNIT/ML SUBCUTANEOUS SOLUTION: 100 unit/mL | SUBCUTANEOUS | Qty: 3

## 2019-11-12 MED FILL — HUMALOG U-100 INSULIN 100 UNIT/ML SUBCUTANEOUS SOLUTION: 100 unit/mL | SUBCUTANEOUS | Qty: 2

## 2019-11-12 MED FILL — HEPARIN (PORCINE) 5,000 UNIT/ML IJ SOLN: 5000 unit/mL | INTRAMUSCULAR | Qty: 1

## 2019-11-12 MED FILL — LEVOFLOXACIN IN D5W 500 MG/100 ML IV PIGGY BACK: 500 mg/100 mL | INTRAVENOUS | Qty: 100

## 2019-11-12 MED FILL — METOPROLOL TARTRATE 5 MG/5 ML IV SOLN: 5 mg/ mL | INTRAVENOUS | Qty: 5

## 2019-11-12 MED FILL — PROPOFOL 10 MG/ML IV EMUL: 10 mg/mL | INTRAVENOUS | Qty: 50

## 2019-11-12 MED FILL — CHLORHEXIDINE GLUCONATE 0.12 % MOUTHWASH: 0.12 % | Qty: 15

## 2019-11-12 MED FILL — HUMALOG U-100 INSULIN 100 UNIT/ML SUBCUTANEOUS SOLUTION: 100 unit/mL | SUBCUTANEOUS | Qty: 7

## 2019-11-12 MED FILL — MEROPENEM 1 GRAM IV SOLR: 1 gram | INTRAVENOUS | Qty: 1

## 2019-11-12 MED FILL — ISOVUE-370  76 % INTRAVENOUS SOLUTION: 370 mg iodine /mL (76 %) | INTRAVENOUS | Qty: 100

## 2019-11-12 MED FILL — PROTONIX 40 MG INTRAVENOUS SOLUTION: 40 mg | INTRAVENOUS | Qty: 40

## 2019-11-12 MED FILL — POTASSIUM CHLORIDE 10 MEQ/100 ML IV PIGGY BACK: 10 mEq/0 mL | INTRAVENOUS | Qty: 100

## 2019-11-12 NOTE — Progress Notes (Signed)
Pulmonology and Critical Care Progress Note    Subjective:     Chief Complaint:   Chief Complaint   Patient presents with   ??? Unresponsive          Patient seen and examined in ICU  Overnight as noted    Remains critically ill  Intubated mechanical ventilation  Currently on assist control rate of 12, tidal volume 450, FiO2 35%, PEEP of 5  ABGs and chest x-ray reviewed  Remains on propofol  Not responding to stimuli per the nurse when taken off sedation  Not requiring vasopressors at this time    Review of Systems:  Review of systems not obtained due to patient factors.    Current Facility-Administered Medications   Medication Dose Route Frequency Provider Last Rate Last Admin   ??? dextrose 5% lactated ringers infusion  100 mL/hr IntraVENous CONTINUOUS Adolphus Birchwood, PA-C 100 mL/hr at 11/12/19 1013 100 mL/hr at 11/12/19 1013   ??? meropenem (MERREM) 1 g in sterile water (preservative free) 20 mL IV syringe  1 g IntraVENous Q12H Kardar, Ahmed Mertie Moores, MD   1 g at 11/12/19 2297   ??? pantoprazole (PROTONIX) 40 mg in 0.9% sodium chloride 10 mL injection  40 mg IntraVENous DAILY Kardar, Ahmed Mertie Moores, MD   40 mg at 11/12/19 9892   ??? chlorhexidine (PERIDEX) 0.12 % mouthwash 15 mL  15 mL Oral Q12H Kardar, Ahmed Mertie Moores, MD   15 mL at 11/12/19 1194   ??? levoFLOXacin (LEVAQUIN) 500 mg in D5W IVPB  500 mg IntraVENous Q48H Kardar, Ahmed Mertie Moores, MD       ??? albuterol-ipratropium (DUO-NEB) 2.5 MG-0.5 MG/3 ML  3 mL Nebulization Q6H PRN Glean Hess, MD       ??? propofol (DIPRIVAN) 10 mg/mL infusion  0-50 mcg/kg/min (Order-Specific) IntraVENous TITRATE Ander Gaster, MD 8.2 mL/hr at 11/12/19 1356 15 mcg/kg/min at 11/12/19 1356   ??? glucose chewable tablet 16 g  4 Tab Oral PRN Ardean Larsen, PA-C       ??? dextrose (D50W) injection syrg 12.5-25 g  25-50 mL IntraVENous PRN Bishop, Krysten, PA-C   25 g at 11/11/19 0133   ??? glucagon (GLUCAGEN) injection 1 mg  1 mg IntraMUSCular PRN Bishop, Krysten, PA-C        ??? insulin lispro (HUMALOG) injection   SubCUTAneous AC&HS Bishop, Jenetta Downer, PA-C   2 Units at 11/12/19 1349   ??? metoprolol (LOPRESSOR) injection 5 mg  5 mg IntraVENous Q6H PRN Bishop, Krysten, PA-C   5 mg at 11/12/19 1350   ??? sodium chloride (NS) flush 5-40 mL  5-40 mL IntraVENous Q8H Bishop, Krysten, PA-C   10 mL at 11/12/19 1413   ??? sodium chloride (NS) flush 5-40 mL  5-40 mL IntraVENous PRN Bishop, Krysten, PA-C   10 mL at 11/11/19 0134   ??? acetaminophen (TYLENOL) tablet 650 mg  650 mg Oral Q6H PRN Bishop, Krysten, PA-C        Or   ??? acetaminophen (TYLENOL) suppository 650 mg  650 mg Rectal Q6H PRN Ardean Larsen, PA-C       ??? polyethylene glycol (MIRALAX) packet 17 g  17 g Oral DAILY PRN Bishop, Krysten, PA-C       ??? ondansetron (ZOFRAN ODT) tablet 4 mg  4 mg Oral Q8H PRN Bishop, Krysten, PA-C        Or   ??? ondansetron (ZOFRAN) injection 4 mg  4 mg IntraVENous Q6H PRN Bishop, Krysten, PA-C   4  mg at 11/10/19 2346   ??? heparin (porcine) injection 5,000 Units  5,000 Units SubCUTAneous Q8H Bishop, Krysten, PA-C   5,000 Units at 11/12/19 1349            No Known Allergies        Objective:     Blood pressure (!) 162/109, pulse (!) 113, temperature 98.2 ??F (36.8 ??C), resp. rate 12, height 5' 0.98" (1.549 m), weight 64.5 kg (142 lb 3.2 oz), SpO2 100 %. Temp (24hrs), Avg:98.1 ??F (36.7 ??C), Min:97.1 ??F (36.2 ??C), Max:98.8 ??F (37.1 ??C)      Intake and Output:  Current Shift: No intake/output data recorded.  Last 3 Shifts: 01/01 1901 - 01/03 0700  In: 1341.9 [I.V.:1341.9]  Out: 2325 [Urine:2325]    Physical Exam:    General:  Lying in bed, intubated on mechanical ventilation  Throat and Neck: Supple  Lung: Reduced air entry bilaterally with prolonged exhalation but no wheezing.  Occasional crackles.  Heart: S1+S2.  No murmurs, tachycardia  Abdomen: soft, non-tender. Bowel sounds normal. No masses; obese  Extremities: No edema  GU: Not done  Skin: No cyanosis  Neurologic:  Sedated on mechanical ventilation   Psychiatric:  Unable to perform due to patient's condition    Lab/Data Review:  Recent Results (from the past 24 hour(s))   GLUCOSE, POC    Collection Time: 11/11/19  3:15 PM   Result Value Ref Range    Glucose (POC) 226 (H) 65 - 100 mg/dL    Performed by [place], POC    Collection Time: 11/11/19  8:58 PM   Result Value Ref Range    Glucose (POC) 326 (H) 65 - 100 mg/dL    Performed by Faith, POC    Collection Time: 11/12/19 12:22 AM   Result Value Ref Range    Glucose (POC) 150 (H) 65 - 100 mg/dL    Performed by [facility], BASIC    Collection Time: 11/12/19  4:30 AM   Result Value Ref Range    Sodium 144 136 - 145 mmol/L    Potassium 3.5 3.5 - 5.1 mmol/L    Chloride 114 (H) 97 - 108 mmol/L    CO2 24 21 - 32 mmol/L    Anion gap 6 5 - 15 mmol/L    Glucose 130 (H) 65 - 100 mg/dL    BUN 14 6 - 20 mg/dL    Creatinine 0.98 0.55 - 1.02 mg/dL    BUN/Creatinine ratio 14 12 - 20      GFR est AA >60 >60 ml/min/1.10m    GFR est non-AA 57 (L) >60 ml/min/1.722m   Calcium 9.4 8.5 - 10.1 mg/dL   CBC W/O DIFF    Collection Time: 11/12/19  4:30 AM   Result Value Ref Range    WBC 8.6 3.6 - 11.0 K/uL    RBC 3.68 (L) 3.80 - 5.20 M/uL    HGB 11.0 (L) 11.5 - 16.0 g/dL    HCT 34.1 (L) 35.0 - 47.0 %    MCV 92.7 80.0 - 99.0 FL    MCH 29.9 26.0 - 34.0 PG    MCHC 32.3 30.0 - 36.5 g/dL    RDW 14.3 11.5 - 14.5 %    PLATELET 242 150 - 400 K/uL    MPV 10.1 8.9 - 1241.3L   METABOLIC PANEL, COMPREHENSIVE    Collection Time: 11/12/19  4:30 AM  Result Value Ref Range    Sodium 143 136 - 145 mmol/L    Potassium 3.6 3.5 - 5.1 mmol/L    Chloride 114 (H) 97 - 108 mmol/L    CO2 24 21 - 32 mmol/L    Anion gap 5 5 - 15 mmol/L    Glucose 134 (H) 65 - 100 mg/dL    BUN 14 6 - 20 mg/dL    Creatinine 0.98 0.55 - 1.02 mg/dL    BUN/Creatinine ratio 14 12 - 20      GFR est AA >60 >60 ml/min/1.41m    GFR est non-AA 57 (L) >60 ml/min/1.772m   Calcium 9.4 8.5 - 10.1 mg/dL     Bilirubin, total 0.5 0.2 - 1.0 mg/dL    AST (SGOT) 22 15 - 37 U/L    ALT (SGPT) 14 12 - 78 U/L    Alk. phosphatase 79 45 - 117 U/L    Protein, total 6.4 6.4 - 8.2 g/dL    Albumin 2.5 (L) 3.5 - 5.0 g/dL    Globulin 3.9 2.0 - 4.0 g/dL    A-G Ratio 0.6 (L) 1.1 - 2.2     MAGNESIUM    Collection Time: 11/12/19  4:30 AM   Result Value Ref Range    Magnesium 1.5 (L) 1.6 - 2.4 mg/dL   BLOOD GAS, ARTERIAL    Collection Time: 11/12/19  4:50 AM   Result Value Ref Range    pH 7.49 (H) 7.35 - 7.45      PCO2 32 (L) 35 - 45 mmHg    PO2 105 (H) 75 - 100 mmHg    O2 SAT 100 >95 %    BICARBONATE 25 22 - 26 mmol/L    BASE EXCESS 0.7 0 - 2 mmol/L    O2 METHOD VENT      FIO2 35.0 %    MODE Assist Control/Volume Control      Tidal volume 450      SET RATE 12      IPAP/PIP 0      EPAP/CPAP/PEEP 5.0      Sample source Arterial      SITE Right Radial      ALLEN'S TEST PASS     GLUCOSE, POC    Collection Time: 11/12/19  4:50 AM   Result Value Ref Range    Glucose (POC) 143 (H) 65 - 100 mg/dL    Performed by PR[place]ber HillsPOC    Collection Time: 11/12/19  7:10 AM   Result Value Ref Range    Glucose (POC) 128 (H) 65 - 100 mg/dL    Performed by KRBowiePOC    Collection Time: 11/12/19 11:14 AM   Result Value Ref Range    Glucose (POC) 188 (H) 65 - 100 mg/dL    Performed by KR[place]cyrusPOC    Collection Time: 11/12/19  1:27 PM   Result Value Ref Range    Glucose (POC) 186 (H) 65 - 100 mg/dL    Performed by PAJerline PainHERESA      chest X-ray      XR CHEST PORT   Final Result   Impression:    No acute cardiopulmonary process.      NM LUNG SCAN PERF   Final Result   Impression:   Normal perfusion scintigraphy.         XR CHEST SNGL V   Final Result  IMPRESSION:   1.  The endotracheal tube tip overlies the tracheal airway approximately 37 mm   above the carina.   2.  The lungs are clear without acute pulmonary parenchymal pathology.        XR CHEST SNGL V   Final Result   IMPRESSION:    1.  The lung parenchyma is clear without acute pulmonary parenchymal pathology.   2.  There is milder ectasia of the thoracic aorta related to mild   atherosclerotic vascular changes.        CT HEAD WO CONT   Final Result   IMPRESSION:    1.  This examination is negative for acute intracranial pathology.   2.  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   3.  There is apparent proptosis more pronounced on the right compared to left.                CT SPINE CERV WO CONT   Final Result   Impression:    1.  This examination is negative for an acute fracture or a subluxation injury   to the cervical spine.   2.  There is moderate degenerative disc disease at the C5-C6 level.   3.  There is right-sided facet arthropathy at the C2-C3 and the C3-C4 levels.   There is narrowing of the right neural foramen at C3-C4 level. There is   left-sided facet arthropathy primarily at the C2-C3 C4-C5 levels. There is   narrowing of the left C4-C5 neural foramen.            MRI BRAIN W WO CONT    (Results Pending)   CTA HEAD    (Results Pending)   XR CHEST PORT    (Results Pending)       CT Results  (Last 48 hours)    None          Assessment:     1.  A 69 year old African-American female with multiple medical problems including history of prior stroke and lung cancer who has been on chemotherapy, presented with unresponsiveness.      1.  Acute respiratory failure with hypoxia:  She was intubated enroute for airway protection.  Currently, she is off propofol.  She is minimally responsive, only opening eyes.  Neurology is consulted.  CT of the head and spine did not show any acute process.  Chest x-ray does not show any focal infiltrates.  There is some mild prominence to the left hilum.  Apparently, she has a history of lung cancer.     Currently on assist control as outlined above  ABGs and chest x-ray reviewed  Ventilator settings modified   Further changes in ventilator settings based on clinical response and ABG results    2.  Altered mental status.    As discussed above,   Neurology is consulted.    Hold propofol for further neurologic evaluation.    3.  History of lung cancer.    Details are not available.    Chest x-ray, however, does not show any significant mass or nodule or other chronic changes.    4.  History of stroke.  5.  Diabetes mellitus.    6.  Acute renal insufficiency.  I will give her some IV fluids and continue to monitor.  7.  For nutrition, she can be started on Glucerna.  We will get a nutritionist input as well.  For deep venous thrombosis prophylaxis, the patient is started on heparin subcutaneously, which will be continued.  For gastrointestinal stress prophylaxis, she is started on Protonix IV which will be continued.    Overall condition remains tenuous  Prognosis guarded  We will check ABG and chest x-ray in a.m.    Questions of patient were answered at bedside in detail  Case discussed in detail with RN, RT, and care team    Thank you for involving me in the care of this patient  I will follow with you closely during hospitalization    Time spent more than 60 minutes in direct patient care with no overlap reviewing results and records, decision-making, and answering questions.      Candie Echevaria, MD  [facility]  11/12/2019  2:24 PM

## 2019-11-12 NOTE — Progress Notes (Signed)
The purpose of the visit was to provide family care dealing with the difficulty of E.O.L. care for their loved one. The patient's granddaughters shared that they did not how to feel right now. Her daughter expressed having love for her mother, but she was having a difficult time dealing with her mother's rapid decline. The Chaplain provided the ministry of presence and the comfort of spiritual care.    Chaplain James R. Johnson D.Min, M.Div.  Chaplain can be reached by calling the operator at [facility]  [phone]

## 2019-11-12 NOTE — Consults (Signed)
General Daily Progress Note    Admit Date: 11/10/2019      Subjective: intubated and unresponsive          Current Facility-Administered Medications   Medication Dose Route Frequency   ??? dextrose 5% lactated ringers infusion  100 mL/hr IntraVENous CONTINUOUS   ??? meropenem (MERREM) 1 g in sterile water (preservative free) 20 mL IV syringe  1 g IntraVENous Q12H   ??? pantoprazole (PROTONIX) 40 mg in 0.9% sodium chloride 10 mL injection  40 mg IntraVENous DAILY   ??? chlorhexidine (PERIDEX) 0.12 % mouthwash 15 mL  15 mL Oral Q12H   ??? levoFLOXacin (LEVAQUIN) 500 mg in D5W IVPB  500 mg IntraVENous Q48H   ??? albuterol-ipratropium (DUO-NEB) 2.5 MG-0.5 MG/3 ML  3 mL Nebulization Q6H PRN   ??? propofol (DIPRIVAN) 10 mg/mL infusion  0-50 mcg/kg/min (Order-Specific) IntraVENous TITRATE   ??? glucose chewable tablet 16 g  4 Tab Oral PRN   ??? dextrose (D50W) injection syrg 12.5-25 g  25-50 mL IntraVENous PRN   ??? glucagon (GLUCAGEN) injection 1 mg  1 mg IntraMUSCular PRN   ??? insulin lispro (HUMALOG) injection   SubCUTAneous AC&HS   ??? metoprolol (LOPRESSOR) injection 5 mg  5 mg IntraVENous Q6H PRN   ??? sodium chloride (NS) flush 5-40 mL  5-40 mL IntraVENous Q8H   ??? sodium chloride (NS) flush 5-40 mL  5-40 mL IntraVENous PRN   ??? acetaminophen (TYLENOL) tablet 650 mg  650 mg Oral Q6H PRN    Or   ??? acetaminophen (TYLENOL) suppository 650 mg  650 mg Rectal Q6H PRN   ??? polyethylene glycol (MIRALAX) packet 17 g  17 g Oral DAILY PRN   ??? ondansetron (ZOFRAN ODT) tablet 4 mg  4 mg Oral Q8H PRN    Or   ??? ondansetron (ZOFRAN) injection 4 mg  4 mg IntraVENous Q6H PRN   ??? heparin (porcine) injection 5,000 Units  5,000 Units SubCUTAneous Q8H        Review of Systems  Review of systems not obtained due to patient factors.    Objective:     Patient Vitals for the past 8 hrs:   BP Temp Pulse Resp SpO2   11/12/19 1200 (!) 162/109 ??? (!) 112 12 100 %   11/12/19 1107 ??? ??? (!) 105 13 100 %   11/12/19 1100 (!) 143/106 98.2 ??F (36.8 ??C) (!) 109 12 100 %    11/12/19 1000 (!) 149/94 ??? (!) 108 12 100 %   11/12/19 0900 (!) 158/97 ??? (!) 111 12 100 %   11/12/19 0800 (!) 156/94 ??? (!) 113 12 100 %   11/12/19 0753 ??? ??? (!) 124 12 100 %   11/12/19 0700 (!) 153/101 97.6 ??F (36.4 ??C) (!) 116 12 100 %   11/12/19 0600 (!) 150/94 ??? (!) 113 13 100 %     No intake/output data recorded.  01/01 1901 - 01/03 0700  In: 1341.9 [I.V.:1341.9]  Out: 2325 [Urine:2325]    Physical Exam:     Neuro Physical Exam  General: Well developed, well nourished. Patient in no apparent distress.  :     Neurological Exam: Eyes open, pupils equal and reactive, no response to sternal rub, does not withdraw to pain, not following commands  Data Review   Recent Results (from the past 24 hour(s))   ECHO ADULT COMPLETE    Collection Time: 11/11/19  2:20 PM   Result Value Ref Range    Pulmonic Regurgitant End Max Velocity 163.00 cm/s    AoV PG 11.00 mmHg    IVSd 1.94 (A) 0.6 - 0.9 cm    LVIDd (M-mode) 2.86 (A) cm    LVIDs 1.74 cm    Pulmonic Regurgitant End Max Velocity 116.00 cm/s    LVOT Peak Gradient 5.00 mmHg    LVPWd 1.79 (A) 0.6 - 0.9 cm    BP EF 71.4 55 - 100 %    LV ES Vol A2C 8.91 cm3    LV Mass AL 222.8 (A) 67 - 162 g    LV Mass AL Index 136.4 (A) 43 - 95 g/m2    LVOT SV 18.1 cm3    Mitral Valve Deceleration Slope 4,170.00 mm/s2    Mitral Valve Deceleration Slope 4,170.00 mm/s2    Mitral Valve E Wave Deceleration Time 239.00 ms    Mitral Valve Pressure Half-time 82.00 ms    MV A Vel 127.00 cm/s    MV E Vel 115.00 cm/s    MVA (PHT) 2.68 cm2    MV E/A 0.91     Est. RA Pressure 3.00 mmHg    RVIDd 2.32 cm    RVSP 32.00 mmHg    Tricuspid Valve Max Velocity 268.00 cm/s    Triscuspid Valve Regurgitation Peak Gradient 29.00 mmHg    LVIDd 2.90 3.9 - 5.3 cm    Right Atrial Area 4C 9.9 cm2    Left Atrium Minor Axis 1.35 cm    Left Atrium Major Axis 2.20 cm    Ao Root D 3.00 cm   GLUCOSE, POC    Collection Time: 11/11/19  3:15 PM   Result Value Ref Range     Glucose (POC) 226 (H) 65 - 100 mg/dL    Performed by Georgia Duff    GLUCOSE, POC    Collection Time: 11/11/19  8:58 PM   Result Value Ref Range    Glucose (POC) 326 (H) 65 - 100 mg/dL    Performed by [place], POC    Collection Time: 11/12/19 12:22 AM   Result Value Ref Range    Glucose (POC) 150 (H) 65 - 100 mg/dL    Performed by [facility], BASIC    Collection Time: 11/12/19  4:30 AM   Result Value Ref Range    Sodium 144 136 - 145 mmol/L    Potassium 3.5 3.5 - 5.1 mmol/L    Chloride 114 (H) 97 - 108 mmol/L    CO2 24 21 - 32 mmol/L    Anion gap 6 5 - 15 mmol/L    Glucose 130 (H) 65 - 100 mg/dL    BUN 14 6 - 20 mg/dL    Creatinine 0.98 0.55 - 1.02 mg/dL    BUN/Creatinine ratio 14 12 - 20      GFR est AA >60 >60 ml/min/1.49m    GFR est non-AA 57 (L) >60 ml/min/1.758m   Calcium 9.4 8.5 - 10.1 mg/dL   CBC W/O DIFF    Collection Time: 11/12/19  4:30 AM   Result Value Ref Range    WBC 8.6 3.6 - 11.0 K/uL    RBC 3.68 (L) 3.80 - 5.20 M/uL    HGB 11.0 (L) 11.5 - 16.0 g/dL  HCT 34.1 (L) 35.0 - 47.0 %    MCV 92.7 80.0 - 99.0 FL    MCH 29.9 26.0 - 34.0 PG    MCHC 32.3 30.0 - 36.5 g/dL    RDW 14.3 11.5 - 14.5 %    PLATELET 242 150 - 400 K/uL    MPV 10.1 8.9 - 33.2 FL   METABOLIC PANEL, COMPREHENSIVE    Collection Time: 11/12/19  4:30 AM   Result Value Ref Range    Sodium 143 136 - 145 mmol/L    Potassium 3.6 3.5 - 5.1 mmol/L    Chloride 114 (H) 97 - 108 mmol/L    CO2 24 21 - 32 mmol/L    Anion gap 5 5 - 15 mmol/L    Glucose 134 (H) 65 - 100 mg/dL    BUN 14 6 - 20 mg/dL    Creatinine 0.98 0.55 - 1.02 mg/dL    BUN/Creatinine ratio 14 12 - 20      GFR est AA >60 >60 ml/min/1.18m    GFR est non-AA 57 (L) >60 ml/min/1.766m   Calcium 9.4 8.5 - 10.1 mg/dL    Bilirubin, total 0.5 0.2 - 1.0 mg/dL    AST (SGOT) 22 15 - 37 U/L    ALT (SGPT) 14 12 - 78 U/L    Alk. phosphatase 79 45 - 117 U/L    Protein, total 6.4 6.4 - 8.2 g/dL    Albumin 2.5 (L) 3.5 - 5.0 g/dL    Globulin 3.9 2.0 - 4.0 g/dL     A-G Ratio 0.6 (L) 1.1 - 2.2     MAGNESIUM    Collection Time: 11/12/19  4:30 AM   Result Value Ref Range    Magnesium 1.5 (L) 1.6 - 2.4 mg/dL   BLOOD GAS, ARTERIAL    Collection Time: 11/12/19  4:50 AM   Result Value Ref Range    pH 7.49 (H) 7.35 - 7.45      PCO2 32 (L) 35 - 45 mmHg    PO2 105 (H) 75 - 100 mmHg    O2 SAT 100 >95 %    BICARBONATE 25 22 - 26 mmol/L    BASE EXCESS 0.7 0 - 2 mmol/L    O2 METHOD VENT      FIO2 35.0 %    MODE Assist Control/Volume Control      Tidal volume 450      SET RATE 12      IPAP/PIP 0      EPAP/CPAP/PEEP 5.0      Sample source Arterial      SITE Right Radial      ALLEN'S TEST PASS     GLUCOSE, POC    Collection Time: 11/12/19  4:50 AM   Result Value Ref Range    Glucose (POC) 143 (H) 65 - 100 mg/dL    Performed by PRMellettePOC    Collection Time: 11/12/19  7:10 AM   Result Value Ref Range    Glucose (POC) 128 (H) 65 - 100 mg/dL    Performed by KRGeorgia Duff  GLUCOSE, POC    Collection Time: 11/12/19 11:14 AM   Result Value Ref Range    Glucose (POC) 188 (H) 65 - 100 mg/dL    Performed by KREarnest ConroyACHEL            Assessment:     Active Problems:    AMS (altered mental status) (11/10/2019)  Plan:     Assessment and Plan: Ms. Kloos is a 69 year old woman with history of small cell lung CA who was found unresposnive. Cause currently not clear. Cannot have mri because she is intubated with multiple drips. Will proceed with a ct head w/wo contrast

## 2019-11-12 NOTE — Progress Notes (Signed)
Hospitalist Progress Note    NAME: Kristin Cooper   DOB:  09-21-1951   MRN:  [id number]       Subjective:     Chief Complaint / Reason for Physician Visit  Patient seen and evaluated at bedside, patient remains intubated and sedated at this time, overnight events reviewed.  Discussed with RN events overnight.     Review of Systems:  Symptom Y/N Comments  Symptom Y/N Comments   Fever/Chills    Chest Pain     Poor Appetite    Edema     Cough    Abdominal Pain     Sputum    Joint Pain     SOB/DOE    Pruritis/Rash     Nausea/vomit    Tolerating PT/OT     Diarrhea    Tolerating Diet     Constipation    Other       Could NOT obtain due to:  Unable to obtain secondary to patient's clinical status     Objective:     VITALS:   Last 24hrs VS reviewed since prior progress note. Most recent are:  Patient Vitals for the past 24 hrs:   Temp Pulse Resp BP SpO2   11/12/19 0753 ??? (!) 124 12 ??? 100 %   11/12/19 0700 97.6 ??F (36.4 ??C) ??? ??? ??? ???   11/12/19 0600 ??? (!) 113 13 (!) 150/94 100 %   11/12/19 0500 98.8 ??F (37.1 ??C) (!) 103 12 (!) 146/96 100 %   11/12/19 0400 ??? (!) 109 12 (!) 151/101 100 %   11/12/19 0300 98.5 ??F (36.9 ??C) (!) 107 12 (!) 149/88 100 %   11/12/19 0200 ??? (!) 104 12 (!) 146/58 100 %   11/12/19 0100 ??? (!) 103 12 130/86 100 %   11/12/19 0000 ??? (!) 107 12 129/89 100 %   11/11/19 2300 98.2 ??F (36.8 ??C) (!) 110 12 (!) 136/93 100 %   11/11/19 2200 ??? (!) 111 12 (!) 134/92 100 %   11/11/19 2100 ??? (!) 114 12 (!) 161/97 100 %   11/11/19 2000 ??? (!) 110 14 (!) 141/94 100 %   11/11/19 1926 ??? (!) 113 12 ??? 100 %   11/11/19 1901 97.1 ??F (36.2 ??C) (!) 118 14 (!) 147/98 100 %   11/11/19 1800 ??? (!) 117 13 (!) 139/99 100 %   11/11/19 1700 ??? (!) 117 13 (!) 151/88 100 %   11/11/19 1600 ??? (!) 119 14 (!) 154/99 100 %   11/11/19 1510 ??? (!) 130 16 ??? 100 %   11/11/19 1500 98.3 ??F (36.8 ??C) (!) 123 14 (!) 140/94 100 %   11/11/19 1400 ??? (!) 126 14 (!) 157/97 100 %   11/11/19 1300 ??? (!) 126 14 (!) 156/95 100 %    11/11/19 1200 ??? (!) 121 14 (!) 178/100 100 %   11/11/19 1127 ??? (!) 118 15 ??? 100 %   11/11/19 1116 ??? (!) 119 15 ??? 100 %   11/11/19 1100 98.2 ??F (36.8 ??C) (!) 119 14 (!) 155/104 100 %   11/11/19 1000 ??? (!) 115 14 (!) 155/101 100 %   11/11/19 0900 ??? (!) 111 14 (!) 149/87 100 %       Intake/Output Summary (Last 24 hours) at 11/12/2019 0826  Last data filed at 11/12/2019 0300  Gross per 24 hour   Intake ???   Output 1225 ml   Net -1225 ml  PHYSICAL EXAM:  General: Patient appears comfortable, currently remains intubated and sedated   EENT:  EOMI. Anicteric sclerae. MMM  Resp:  Decreased air entry bilaterally in bilateral lower lung zone  CV:  Regular  rhythm,  No edema  GI:  Soft, Non distended, Non tender.  +Bowel sounds  Neurologic:  Limited Secondary to patient's clinical status  Psych:   Limited secondary to patient's clinical status  Skin:  No rashes.  No jaundice    Procedures: see electronic medical records for all procedures/Xrays and details which were not copied into this note but were reviewed prior to creation of Plan.      LABS:  I reviewed today's most current labs and imaging studies.  Pertinent labs include:  Recent Labs     11/11/19  0445 11/10/19  1150   WBC 8.9 11.9*   HGB 11.0* 13.6   HCT 34.6* 41.8   PLT 241 305     Recent Labs     11/11/19  0445 11/10/19  1150   NA 149* 147*   K 3.6 4.1   CL 122* 116*   CO2 22 22   GLU 121* 146*   BUN 25* 37*   CREA 1.29* 2.01*   CA 9.1 10.2*   MG  --  2.2   ALB  --  3.8   TBILI  --  1.0   ALT  --  23   INR  --  1.1       Signed: Caillou Minus Ned Card, MD    V/Q scan:IMPRESSION  Impression:  Normal perfusion scintigraphy.    CT head without contrast:IMPRESSION  IMPRESSION:   1.  This examination is negative for acute intracranial pathology.  2.  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  3.  There is apparent proptosis more pronounced on the right compared to left.    Xray chest:IMPRESSION   IMPRESSION:  1.  The endotracheal tube tip overlies the tracheal airway approximately 37 mm  above the carina.  2.  The lungs are clear without acute pulmonary parenchymal pathology.    ??    Reviewed most current lab test results and cultures  YES  Reviewed most current radiology test results   YES  Review and summation of old records today    NO  Reviewed patient's current orders and MAR    YES  PMH/SH reviewed - no change compared to H&P      Assessment / Plan:  Altered mental status???of note patient presented with altered mental status due to unclear etiology, could be secondary to hypoglycemia versus seizure activity versus metabolic encephalopathy, patient was seizure-free overnight, patient CT head was negative for any acute intracranial pathologies  Continue to monitor mental status  Audiovisual EEG  Follow-up MRI brain  Neurology consult appreciated will continue to follow    Acute respiratory failure/aspiration pneumonia???resented with above-mentioned symptomatology and was emergently intubated secondary to airway protection, with significant concerns for aspiration pneumonia, patient does not meet sepsis criteria  Follow-up blood cultures  Follow-up sputum cultures  Meropenem and Levaquin for antibiotic coverage  Attempt weaning trial this AM  Pulmonology consult appreciated, will continue to follow    Diabetes???continue insulin sliding scale    Prophylaxis???Heparin subcu tonics, chlorhexidine mouthwash, Protonix once daily  Critical care time spent 50 minutes involving direct patient care as well as reviewing patient's labs and coordination of care with nursing staff  18.5 - 24.9 Normal weight / Body mass index is 26.87 kg/m??.    Code status: Full  Prophylaxis: Lovenox  Recommended Disposition: [facility]/Rehab     ________________________________________________________________________  Care Plan discussed with:    Comments   Patient     Family  X    RN x    Care Manager X    Consultant  X                      x Multidiciplinary team rounds were held today with case manager, nursing, pharmacist and clinical coordinator.  Patient's plan of care was discussed; medications were reviewed and discharge planning was addressed.     ________________________________________________________________________    Total CRITICAL CARE TIME Spent: 50   Minutes non procedure based      Comments   >50% of visit spent in counseling and coordination of care X    ________________________________________________________________________  Alisia Ferrari, MD

## 2019-11-12 NOTE — Progress Notes (Signed)
Bedside report to Teresa Parker RN

## 2019-11-12 NOTE — Progress Notes (Signed)
Pulmonology and Critical Care Progress Note    Subjective:     Chief Complaint:   Chief Complaint   Patient presents with   ??? Unresponsive          Patient seen and examined in ICU  Overnight as noted    Remains critically ill  Intubated mechanical ventilation  Currently on assist control rate of 12, tidal volume 450, FiO2 35%, PEEP of 5  ABGs and chest x-ray reviewed  Remains on propofol  Not responding to stimuli per the nurse when taken off sedation  Not requiring vasopressors at this time    Review of Systems:  Review of systems not obtained due to patient factors.    Current Facility-Administered Medications   Medication Dose Route Frequency Provider Last Rate Last Admin   ??? dextrose 5% lactated ringers infusion  100 mL/hr IntraVENous CONTINUOUS Adolphus Birchwood, PA-C 100 mL/hr at 11/12/19 1013 100 mL/hr at 11/12/19 1013   ??? meropenem (MERREM) 1 g in sterile water (preservative free) 20 mL IV syringe  1 g IntraVENous Q12H Kardar, Ahmed Mertie Moores, MD   1 g at 11/12/19 6237   ??? pantoprazole (PROTONIX) 40 mg in 0.9% sodium chloride 10 mL injection  40 mg IntraVENous DAILY Kardar, Ahmed Mertie Moores, MD   40 mg at 11/12/19 6283   ??? chlorhexidine (PERIDEX) 0.12 % mouthwash 15 mL  15 mL Oral Q12H Kardar, Ahmed Mertie Moores, MD   15 mL at 11/12/19 1517   ??? levoFLOXacin (LEVAQUIN) 500 mg in D5W IVPB  500 mg IntraVENous Q48H Kardar, Ahmed Mertie Moores, MD       ??? albuterol-ipratropium (DUO-NEB) 2.5 MG-0.5 MG/3 ML  3 mL Nebulization Q6H PRN Glean Hess, MD       ??? propofol (DIPRIVAN) 10 mg/mL infusion  0-50 mcg/kg/min (Order-Specific) IntraVENous TITRATE Ander Gaster, MD 8.2 mL/hr at 11/12/19 1356 15 mcg/kg/min at 11/12/19 1356   ??? glucose chewable tablet 16 g  4 Tab Oral PRN Ardean Larsen, PA-C       ??? dextrose (D50W) injection syrg 12.5-25 g  25-50 mL IntraVENous PRN Bishop, Krysten, PA-C   25 g at 11/11/19 0133   ??? glucagon (GLUCAGEN) injection 1 mg  1 mg IntraMUSCular PRN Bishop, Krysten, PA-C       ???  insulin lispro (HUMALOG) injection   SubCUTAneous AC&HS Bishop, Jenetta Downer, PA-C   2 Units at 11/12/19 1349   ??? metoprolol (LOPRESSOR) injection 5 mg  5 mg IntraVENous Q6H PRN Bishop, Krysten, PA-C   5 mg at 11/12/19 1350   ??? sodium chloride (NS) flush 5-40 mL  5-40 mL IntraVENous Q8H Bishop, Krysten, PA-C   10 mL at 11/12/19 1413   ??? sodium chloride (NS) flush 5-40 mL  5-40 mL IntraVENous PRN Bishop, Krysten, PA-C   10 mL at 11/11/19 0134   ??? acetaminophen (TYLENOL) tablet 650 mg  650 mg Oral Q6H PRN Bishop, Krysten, PA-C        Or   ??? acetaminophen (TYLENOL) suppository 650 mg  650 mg Rectal Q6H PRN Ardean Larsen, PA-C       ??? polyethylene glycol (MIRALAX) packet 17 g  17 g Oral DAILY PRN Bishop, Krysten, PA-C       ??? ondansetron (ZOFRAN ODT) tablet 4 mg  4 mg Oral Q8H PRN Bishop, Krysten, PA-C        Or   ??? ondansetron (ZOFRAN) injection 4 mg  4 mg IntraVENous Q6H PRN Bishop, Krysten, PA-C   4  mg at 11/10/19 2346   ??? heparin (porcine) injection 5,000 Units  5,000 Units SubCUTAneous Q8H Bishop, Krysten, PA-C   5,000 Units at 11/12/19 1349            No Known Allergies        Objective:     Blood pressure (!) 162/109, pulse (!) 113, temperature 98.2 ??F (36.8 ??C), resp. rate 12, height 5' 0.98" (1.549 m), weight 64.5 kg (142 lb 3.2 oz), SpO2 100 %. Temp (24hrs), Avg:98.1 ??F (36.7 ??C), Min:97.1 ??F (36.2 ??C), Max:98.8 ??F (37.1 ??C)      Intake and Output:  Current Shift: No intake/output data recorded.  Last 3 Shifts: 01/01 1901 - 01/03 0700  In: 1341.9 [I.V.:1341.9]  Out: 2325 [Urine:2325]    Physical Exam:    General:  Lying in bed, intubated on mechanical ventilation  Throat and Neck: Supple  Lung: Reduced air entry bilaterally with prolonged exhalation but no wheezing.  Occasional crackles.  Heart: S1+S2.  No murmurs, tachycardia  Abdomen: soft, non-tender. Bowel sounds normal. No masses; obese  Extremities: No edema  GU: Not done  Skin: No cyanosis  Neurologic:  Sedated on mechanical ventilation  Psychiatric:   Unable to perform due to patient's condition    Lab/Data Review:  Recent Results (from the past 24 hour(s))   GLUCOSE, POC    Collection Time: 11/11/19  3:15 PM   Result Value Ref Range    Glucose (POC) 226 (H) 65 - 100 mg/dL    Performed by [place], POC    Collection Time: 11/11/19  8:58 PM   Result Value Ref Range    Glucose (POC) 326 (H) 65 - 100 mg/dL    Performed by [place], POC    Collection Time: 11/12/19 12:22 AM   Result Value Ref Range    Glucose (POC) 150 (H) 65 - 100 mg/dL    Performed by [facility] [place], BASIC    Collection Time: 11/12/19  4:30 AM   Result Value Ref Range    Sodium 144 136 - 145 mmol/L    Potassium 3.5 3.5 - 5.1 mmol/L    Chloride 114 (H) 97 - 108 mmol/L    CO2 24 21 - 32 mmol/L    Anion gap 6 5 - 15 mmol/L    Glucose 130 (H) 65 - 100 mg/dL    BUN 14 6 - 20 mg/dL    Creatinine 0.98 0.55 - 1.02 mg/dL    BUN/Creatinine ratio 14 12 - 20      GFR est AA >60 >60 ml/min/1.52m    GFR est non-AA 57 (L) >60 ml/min/1.76m   Calcium 9.4 8.5 - 10.1 mg/dL   CBC W/O DIFF    Collection Time: 11/12/19  4:30 AM   Result Value Ref Range    WBC 8.6 3.6 - 11.0 K/uL    RBC 3.68 (L) 3.80 - 5.20 M/uL    HGB 11.0 (L) 11.5 - 16.0 g/dL    HCT 34.1 (L) 35.0 - 47.0 %    MCV 92.7 80.0 - 99.0 FL    MCH 29.9 26.0 - 34.0 PG    MCHC 32.3 30.0 - 36.5 g/dL    RDW 14.3 11.5 - 14.5 %    PLATELET 242 150 - 400 K/uL    MPV 10.1 8.9 - 1260.6L   METABOLIC PANEL, COMPREHENSIVE    Collection Time: 11/12/19  4:30 AM  Result Value Ref Range    Sodium 143 136 - 145 mmol/L    Potassium 3.6 3.5 - 5.1 mmol/L    Chloride 114 (H) 97 - 108 mmol/L    CO2 24 21 - 32 mmol/L    Anion gap 5 5 - 15 mmol/L    Glucose 134 (H) 65 - 100 mg/dL    BUN 14 6 - 20 mg/dL    Creatinine 0.98 0.55 - 1.02 mg/dL    BUN/Creatinine ratio 14 12 - 20      GFR est AA >60 >60 ml/min/1.31m    GFR est non-AA 57 (L) >60 ml/min/1.729m   Calcium 9.4 8.5 - 10.1 mg/dL    Bilirubin, total 0.5 0.2 - 1.0 mg/dL    AST  (SGOT) 22 15 - 37 U/L    ALT (SGPT) 14 12 - 78 U/L    Alk. phosphatase 79 45 - 117 U/L    Protein, total 6.4 6.4 - 8.2 g/dL    Albumin 2.5 (L) 3.5 - 5.0 g/dL    Globulin 3.9 2.0 - 4.0 g/dL    A-G Ratio 0.6 (L) 1.1 - 2.2     MAGNESIUM    Collection Time: 11/12/19  4:30 AM   Result Value Ref Range    Magnesium 1.5 (L) 1.6 - 2.4 mg/dL   BLOOD GAS, ARTERIAL    Collection Time: 11/12/19  4:50 AM   Result Value Ref Range    pH 7.49 (H) 7.35 - 7.45      PCO2 32 (L) 35 - 45 mmHg    PO2 105 (H) 75 - 100 mmHg    O2 SAT 100 >95 %    BICARBONATE 25 22 - 26 mmol/L    BASE EXCESS 0.7 0 - 2 mmol/L    O2 METHOD VENT      FIO2 35.0 %    MODE Assist Control/Volume Control      Tidal volume 450      SET RATE 12      IPAP/PIP 0      EPAP/CPAP/PEEP 5.0      Sample source Arterial      SITE Right Radial      ALLEN'S TEST PASS     GLUCOSE, POC    Collection Time: 11/12/19  4:50 AM   Result Value Ref Range    Glucose (POC) 143 (H) 65 - 100 mg/dL    Performed by [place]    Collection Time: 11/12/19  7:10 AM   Result Value Ref Range    Glucose (POC) 128 (H) 65 - 100 mg/dL    Performed by [facility]    Collection Time: 11/12/19 11:14 AM   Result Value Ref Range    Glucose (POC) 188 (H) 65 - 100 mg/dL    Performed by [facility]    Collection Time: 11/12/19  1:27 PM   Result Value Ref Range    Glucose (POC) 186 (H) 65 - 100 mg/dL    Performed by PAJerline PainHERESA      chest X-ray      XR CHEST PORT   Final Result   Impression:    No acute cardiopulmonary process.      NM LUNG SCAN PERF   Final Result   Impression:   Normal perfusion scintigraphy.         XR CHEST SNGL V   Final Result  IMPRESSION:   1.  The endotracheal tube tip overlies the tracheal airway approximately 37 mm   above the carina.   2.  The lungs are clear without acute pulmonary parenchymal pathology.        XR CHEST SNGL V   Final Result   IMPRESSION:   1.  The lung parenchyma is clear without acute pulmonary  parenchymal pathology.   2.  There is milder ectasia of the thoracic aorta related to mild   atherosclerotic vascular changes.        CT HEAD WO CONT   Final Result   IMPRESSION:    1.  This examination is negative for acute intracranial pathology.   2.  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   3.  There is apparent proptosis more pronounced on the right compared to left.                CT SPINE CERV WO CONT   Final Result   Impression:    1.  This examination is negative for an acute fracture or a subluxation injury   to the cervical spine.   2.  There is moderate degenerative disc disease at the C5-C6 level.   3.  There is right-sided facet arthropathy at the C2-C3 and the C3-C4 levels.   There is narrowing of the right neural foramen at C3-C4 level. There is   left-sided facet arthropathy primarily at the C2-C3 C4-C5 levels. There is   narrowing of the left C4-C5 neural foramen.            MRI BRAIN W WO CONT    (Results Pending)   CTA HEAD    (Results Pending)   XR CHEST PORT    (Results Pending)       CT Results  (Last 48 hours)    None          Assessment:     1.  A 69 year old African-American female with multiple medical problems including history of prior stroke and lung cancer who has been on chemotherapy, presented with unresponsiveness.      1.  Acute respiratory failure with hypoxia:  She was intubated enroute for airway protection.  Currently, she is off propofol.  She is minimally responsive, only opening eyes.  Neurology is consulted.  CT of the head and spine did not show any acute process.  Chest x-ray does not show any focal infiltrates.  There is some mild prominence to the left hilum.  Apparently, she has a history of lung cancer.     Currently on assist control as outlined above  ABGs and chest x-ray reviewed  Ventilator settings modified  Further changes in ventilator settings based on clinical response and ABG  results    2.  Altered mental status.    As discussed above,   Neurology is consulted.    Hold propofol for further neurologic evaluation.    3.  History of lung cancer.    Details are not available.    Chest x-ray, however, does not show any significant mass or nodule or other chronic changes.    4.  History of stroke.  5.  Diabetes mellitus.    6.  Acute renal insufficiency.  I will give her some IV fluids and continue to monitor.  7.  For nutrition, she can be started on Glucerna.  We will get a nutritionist input as well.  For deep venous thrombosis prophylaxis, the patient is started on heparin subcutaneously, which will be continued.  For gastrointestinal stress prophylaxis, she is started on Protonix IV which will be continued.    Overall condition remains tenuous  Prognosis guarded  We will check ABG and chest x-ray in a.m.    Questions of patient were answered at bedside in detail  Case discussed in detail with RN, RT, and care team    Thank you for involving me in the care of this patient  I will follow with you closely during hospitalization    Time spent more than 60 minutes in direct patient care with no overlap reviewing results and records, decision-making, and answering questions.      Candie Echevaria, MD  [facility]  11/12/2019  2:24 PM

## 2019-11-12 NOTE — Progress Notes (Signed)
Hospitalist Progress Note    NAME: Kristin Cooper   DOB:  03-18-1951   MRN:  [id number]       Subjective:     Chief Complaint / Reason for Physician Visit  Patient seen and evaluated at bedside, patient remains intubated and sedated at this time, overnight events reviewed.  Discussed with RN events overnight.     Review of Systems:  Symptom Y/N Comments  Symptom Y/N Comments   Fever/Chills    Chest Pain     Poor Appetite    Edema     Cough    Abdominal Pain     Sputum    Joint Pain     SOB/DOE    Pruritis/Rash     Nausea/vomit    Tolerating PT/OT     Diarrhea    Tolerating Diet     Constipation    Other       Could NOT obtain due to:  Unable to obtain secondary to patient's clinical status     Objective:     VITALS:   Last 24hrs VS reviewed since prior progress note. Most recent are:  Patient Vitals for the past 24 hrs:   Temp Pulse Resp BP SpO2   11/12/19 0753 ??? (!) 124 12 ??? 100 %   11/12/19 0700 97.6 ??F (36.4 ??C) ??? ??? ??? ???   11/12/19 0600 ??? (!) 113 13 (!) 150/94 100 %   11/12/19 0500 98.8 ??F (37.1 ??C) (!) 103 12 (!) 146/96 100 %   11/12/19 0400 ??? (!) 109 12 (!) 151/101 100 %   11/12/19 0300 98.5 ??F (36.9 ??C) (!) 107 12 (!) 149/88 100 %   11/12/19 0200 ??? (!) 104 12 (!) 146/58 100 %   11/12/19 0100 ??? (!) 103 12 130/86 100 %   11/12/19 0000 ??? (!) 107 12 129/89 100 %   11/11/19 2300 98.2 ??F (36.8 ??C) (!) 110 12 (!) 136/93 100 %   11/11/19 2200 ??? (!) 111 12 (!) 134/92 100 %   11/11/19 2100 ??? (!) 114 12 (!) 161/97 100 %   11/11/19 2000 ??? (!) 110 14 (!) 141/94 100 %   11/11/19 1926 ??? (!) 113 12 ??? 100 %   11/11/19 1901 97.1 ??F (36.2 ??C) (!) 118 14 (!) 147/98 100 %   11/11/19 1800 ??? (!) 117 13 (!) 139/99 100 %   11/11/19 1700 ??? (!) 117 13 (!) 151/88 100 %   11/11/19 1600 ??? (!) 119 14 (!) 154/99 100 %   11/11/19 1510 ??? (!) 130 16 ??? 100 %   11/11/19 1500 98.3 ??F (36.8 ??C) (!) 123 14 (!) 140/94 100 %   11/11/19 1400 ??? (!) 126 14 (!) 157/97 100 %   11/11/19 1300 ??? (!) 126 14 (!) 156/95 100 %   11/11/19 1200 ??? (!) 121 14 (!)  178/100 100 %   11/11/19 1127 ??? (!) 118 15 ??? 100 %   11/11/19 1116 ??? (!) 119 15 ??? 100 %   11/11/19 1100 98.2 ??F (36.8 ??C) (!) 119 14 (!) 155/104 100 %   11/11/19 1000 ??? (!) 115 14 (!) 155/101 100 %   11/11/19 0900 ??? (!) 111 14 (!) 149/87 100 %       Intake/Output Summary (Last 24 hours) at 11/12/2019 0826  Last data filed at 11/12/2019 0300  Gross per 24 hour   Intake ???   Output 1225 ml   Net -1225 ml  PHYSICAL EXAM:  General: Patient appears comfortable, currently remains intubated and sedated   EENT:  EOMI. Anicteric sclerae. MMM  Resp:  Decreased air entry bilaterally in bilateral lower lung zone  CV:  Regular  rhythm,  No edema  GI:  Soft, Non distended, Non tender.  +Bowel sounds  Neurologic:  Limited Secondary to patient's clinical status  Psych:   Limited secondary to patient's clinical status  Skin:  No rashes.  No jaundice    Procedures: see electronic medical records for all procedures/Xrays and details which were not copied into this note but were reviewed prior to creation of Plan.      LABS:  I reviewed today's most current labs and imaging studies.  Pertinent labs include:  Recent Labs     11/11/19  0445 11/10/19  1150   WBC 8.9 11.9*   HGB 11.0* 13.6   HCT 34.6* 41.8   PLT 241 305     Recent Labs     11/11/19  0445 11/10/19  1150   NA 149* 147*   K 3.6 4.1   CL 122* 116*   CO2 22 22   GLU 121* 146*   BUN 25* 37*   CREA 1.29* 2.01*   CA 9.1 10.2*   MG  --  2.2   ALB  --  3.8   TBILI  --  1.0   ALT  --  23   INR  --  1.1       Signed: Tylon Kemmerling Ned Card, MD    V/Q scan:IMPRESSION  Impression:  Normal perfusion scintigraphy.    CT head without contrast:IMPRESSION  IMPRESSION:   1.  This examination is negative for acute intracranial pathology.  2.  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  3.  There is apparent proptosis more pronounced on the right compared to left.    Xray chest:IMPRESSION  IMPRESSION:  1.  The endotracheal  tube tip overlies the tracheal airway approximately 37 mm  above the carina.  2.  The lungs are clear without acute pulmonary parenchymal pathology.    ??    Reviewed most current lab test results and cultures  YES  Reviewed most current radiology test results   YES  Review and summation of old records today    NO  Reviewed patient's current orders and MAR    YES  PMH/SH reviewed - no change compared to H&P      Assessment / Plan:  Altered mental status???of note patient presented with altered mental status due to unclear etiology, could be secondary to hypoglycemia versus seizure activity versus metabolic encephalopathy, patient was seizure-free overnight, patient CT head was negative for any acute intracranial pathologies  Continue to monitor mental status  Audiovisual EEG  Follow-up MRI brain  Neurology consult appreciated will continue to follow    Acute respiratory failure/aspiration pneumonia???resented with above-mentioned symptomatology and was emergently intubated secondary to airway protection, with significant concerns for aspiration pneumonia, patient does not meet sepsis criteria  Follow-up blood cultures  Follow-up sputum cultures  Meropenem and Levaquin for antibiotic coverage  Attempt weaning trial this AM  Pulmonology consult appreciated, will continue to follow    Diabetes???continue insulin sliding scale    Prophylaxis???Heparin subcu tonics, chlorhexidine mouthwash, Protonix once daily  Critical care time spent 50 minutes involving direct patient care as well as reviewing patient's labs and coordination of care with nursing staff  18.5 - 24.9 Normal weight / Body mass index is 26.87 kg/m??.    Code status: Full  Prophylaxis: Lovenox  Recommended Disposition: [facility]/Rehab     ________________________________________________________________________  Care Plan discussed with:    Comments   Patient     Family  X    RN x    Care Manager X    Consultant  X                     x Multidiciplinary team rounds were  held today with case manager, nursing, pharmacist and clinical coordinator.  Patient's plan of care was discussed; medications were reviewed and discharge planning was addressed.     ________________________________________________________________________    Total CRITICAL CARE TIME Spent: 50   Minutes non procedure based      Comments   >50% of visit spent in counseling and coordination of care X    ________________________________________________________________________  Alisia Ferrari, MD

## 2019-11-12 NOTE — Progress Notes (Signed)
Bedside report to Michaell Cowing RN

## 2019-11-12 NOTE — Consults (Signed)
General Daily Progress Note    Admit Date: 11/10/2019      Subjective: intubated and unresponsive          Current Facility-Administered Medications   Medication Dose Route Frequency   ??? dextrose 5% lactated ringers infusion  100 mL/hr IntraVENous CONTINUOUS   ??? meropenem (MERREM) 1 g in sterile water (preservative free) 20 mL IV syringe  1 g IntraVENous Q12H   ??? pantoprazole (PROTONIX) 40 mg in 0.9% sodium chloride 10 mL injection  40 mg IntraVENous DAILY   ??? chlorhexidine (PERIDEX) 0.12 % mouthwash 15 mL  15 mL Oral Q12H   ??? levoFLOXacin (LEVAQUIN) 500 mg in D5W IVPB  500 mg IntraVENous Q48H   ??? albuterol-ipratropium (DUO-NEB) 2.5 MG-0.5 MG/3 ML  3 mL Nebulization Q6H PRN   ??? propofol (DIPRIVAN) 10 mg/mL infusion  0-50 mcg/kg/min (Order-Specific) IntraVENous TITRATE   ??? glucose chewable tablet 16 g  4 Tab Oral PRN   ??? dextrose (D50W) injection syrg 12.5-25 g  25-50 mL IntraVENous PRN   ??? glucagon (GLUCAGEN) injection 1 mg  1 mg IntraMUSCular PRN   ??? insulin lispro (HUMALOG) injection   SubCUTAneous AC&HS   ??? metoprolol (LOPRESSOR) injection 5 mg  5 mg IntraVENous Q6H PRN   ??? sodium chloride (NS) flush 5-40 mL  5-40 mL IntraVENous Q8H   ??? sodium chloride (NS) flush 5-40 mL  5-40 mL IntraVENous PRN   ??? acetaminophen (TYLENOL) tablet 650 mg  650 mg Oral Q6H PRN    Or   ??? acetaminophen (TYLENOL) suppository 650 mg  650 mg Rectal Q6H PRN   ??? polyethylene glycol (MIRALAX) packet 17 g  17 g Oral DAILY PRN   ??? ondansetron (ZOFRAN ODT) tablet 4 mg  4 mg Oral Q8H PRN    Or   ??? ondansetron (ZOFRAN) injection 4 mg  4 mg IntraVENous Q6H PRN   ??? heparin (porcine) injection 5,000 Units  5,000 Units SubCUTAneous Q8H        Review of Systems  Review of systems not obtained due to patient factors.    Objective:     Patient Vitals for the past 8 hrs:   BP Temp Pulse Resp SpO2   11/12/19 1200 (!) 162/109 ??? (!) 112 12 100 %   11/12/19 1107 ??? ??? (!) 105 13 100 %   11/12/19 1100 (!) 143/106 98.2 ??F (36.8 ??C) (!) 109 12 100 %   11/12/19  1000 (!) 149/94 ??? (!) 108 12 100 %   11/12/19 0900 (!) 158/97 ??? (!) 111 12 100 %   11/12/19 0800 (!) 156/94 ??? (!) 113 12 100 %   11/12/19 0753 ??? ??? (!) 124 12 100 %   11/12/19 0700 (!) 153/101 97.6 ??F (36.4 ??C) (!) 116 12 100 %   11/12/19 0600 (!) 150/94 ??? (!) 113 13 100 %     No intake/output data recorded.  01/01 1901 - 01/03 0700  In: 1341.9 [I.V.:1341.9]  Out: 2325 [Urine:2325]    Physical Exam:     Neuro Physical Exam  General: Well developed, well nourished. Patient in no apparent distress.  :     Neurological Exam: Eyes open, pupils equal and reactive, no response to sternal rub, does not withdraw to pain, not following commands  Data Review   Recent Results (from the past 24 hour(s))   ECHO ADULT COMPLETE    Collection Time: 11/11/19  2:20 PM   Result Value Ref Range    Pulmonic Regurgitant End Max Velocity 163.00 cm/s    AoV PG 11.00 mmHg    IVSd 1.94 (A) 0.6 - 0.9 cm    LVIDd (M-mode) 2.86 (A) cm    LVIDs 1.74 cm    Pulmonic Regurgitant End Max Velocity 116.00 cm/s    LVOT Peak Gradient 5.00 mmHg    LVPWd 1.79 (A) 0.6 - 0.9 cm    BP EF 71.4 55 - 100 %    LV ES Vol A2C 8.91 cm3    LV Mass AL 222.8 (A) 67 - 162 g    LV Mass AL Index 136.4 (A) 43 - 95 g/m2    LVOT SV 18.1 cm3    Mitral Valve Deceleration Slope 4,170.00 mm/s2    Mitral Valve Deceleration Slope 4,170.00 mm/s2    Mitral Valve E Wave Deceleration Time 239.00 ms    Mitral Valve Pressure Half-time 82.00 ms    MV A Vel 127.00 cm/s    MV E Vel 115.00 cm/s    MVA (PHT) 2.68 cm2    MV E/A 0.91     Est. RA Pressure 3.00 mmHg    RVIDd 2.32 cm    RVSP 32.00 mmHg    Tricuspid Valve Max Velocity 268.00 cm/s    Triscuspid Valve Regurgitation Peak Gradient 29.00 mmHg    LVIDd 2.90 3.9 - 5.3 cm    Right Atrial Area 4C 9.9 cm2    Left Atrium Minor Axis 1.35 cm    Left Atrium Major Axis 2.20 cm    Ao Root D 3.00 cm   GLUCOSE, POC    Collection Time: 11/11/19  3:15 PM   Result Value Ref Range    Glucose (POC) 226 (H)  65 - 100 mg/dL    Performed by Georgia Duff    GLUCOSE, POC    Collection Time: 11/11/19  8:58 PM   Result Value Ref Range    Glucose (POC) 326 (H) 65 - 100 mg/dL    Performed by [place], POC    Collection Time: 11/12/19 12:22 AM   Result Value Ref Range    Glucose (POC) 150 (H) 65 - 100 mg/dL    Performed by [facility], BASIC    Collection Time: 11/12/19  4:30 AM   Result Value Ref Range    Sodium 144 136 - 145 mmol/L    Potassium 3.5 3.5 - 5.1 mmol/L    Chloride 114 (H) 97 - 108 mmol/L    CO2 24 21 - 32 mmol/L    Anion gap 6 5 - 15 mmol/L    Glucose 130 (H) 65 - 100 mg/dL    BUN 14 6 - 20 mg/dL    Creatinine 0.98 0.55 - 1.02 mg/dL    BUN/Creatinine ratio 14 12 - 20      GFR est AA >60 >60 ml/min/1.26m    GFR est non-AA 57 (L) >60 ml/min/1.752m   Calcium 9.4 8.5 - 10.1 mg/dL   CBC W/O DIFF    Collection Time: 11/12/19  4:30 AM   Result Value Ref Range    WBC 8.6 3.6 - 11.0 K/uL    RBC 3.68 (L) 3.80 - 5.20 M/uL    HGB 11.0 (L) 11.5 - 16.0 g/dL  HCT 34.1 (L) 35.0 - 47.0 %    MCV 92.7 80.0 - 99.0 FL    MCH 29.9 26.0 - 34.0 PG    MCHC 32.3 30.0 - 36.5 g/dL    RDW 14.3 11.5 - 14.5 %    PLATELET 242 150 - 400 K/uL    MPV 10.1 8.9 - 10.9 FL   METABOLIC PANEL, COMPREHENSIVE    Collection Time: 11/12/19  4:30 AM   Result Value Ref Range    Sodium 143 136 - 145 mmol/L    Potassium 3.6 3.5 - 5.1 mmol/L    Chloride 114 (H) 97 - 108 mmol/L    CO2 24 21 - 32 mmol/L    Anion gap 5 5 - 15 mmol/L    Glucose 134 (H) 65 - 100 mg/dL    BUN 14 6 - 20 mg/dL    Creatinine 0.98 0.55 - 1.02 mg/dL    BUN/Creatinine ratio 14 12 - 20      GFR est AA >60 >60 ml/min/1.74m    GFR est non-AA 57 (L) >60 ml/min/1.752m   Calcium 9.4 8.5 - 10.1 mg/dL    Bilirubin, total 0.5 0.2 - 1.0 mg/dL    AST (SGOT) 22 15 - 37 U/L    ALT (SGPT) 14 12 - 78 U/L    Alk. phosphatase 79 45 - 117 U/L    Protein, total 6.4 6.4 - 8.2 g/dL    Albumin 2.5 (L) 3.5 - 5.0 g/dL    Globulin 3.9 2.0 - 4.0 g/dL    A-G Ratio 0.6 (L) 1.1  - 2.2     MAGNESIUM    Collection Time: 11/12/19  4:30 AM   Result Value Ref Range    Magnesium 1.5 (L) 1.6 - 2.4 mg/dL   BLOOD GAS, ARTERIAL    Collection Time: 11/12/19  4:50 AM   Result Value Ref Range    pH 7.49 (H) 7.35 - 7.45      PCO2 32 (L) 35 - 45 mmHg    PO2 105 (H) 75 - 100 mmHg    O2 SAT 100 >95 %    BICARBONATE 25 22 - 26 mmol/L    BASE EXCESS 0.7 0 - 2 mmol/L    O2 METHOD VENT      FIO2 35.0 %    MODE Assist Control/Volume Control      Tidal volume 450      SET RATE 12      IPAP/PIP 0      EPAP/CPAP/PEEP 5.0      Sample source Arterial      SITE Right Radial      ALLEN'S TEST PASS     GLUCOSE, POC    Collection Time: 11/12/19  4:50 AM   Result Value Ref Range    Glucose (POC) 143 (H) 65 - 100 mg/dL    Performed by [facility]    Collection Time: 11/12/19  7:10 AM   Result Value Ref Range    Glucose (POC) 128 (H) 65 - 100 mg/dL    Performed by KRGeorgia Duff  GLUCOSE, POC    Collection Time: 11/12/19 11:14 AM   Result Value Ref Range    Glucose (POC) 188 (H) 65 - 100 mg/dL    Performed by KREarnest ConroyACHEL            Assessment:     Active Problems:    AMS (altered mental status) (11/10/2019)  Plan:     Assessment and Plan: Kristin Cooper is a 69 year old woman with history of small cell lung CA who was found unresposnive. Cause currently not clear. Cannot have mri because she is intubated with multiple drips. Will proceed with a ct head w/wo contrast

## 2019-11-12 NOTE — Progress Notes (Signed)
The purpose of the visit was to provide family care dealing with the difficulty of E.O.L. care for their loved one. The patient's granddaughters shared that they did not how to feel right now. Her daughter expressed having love for her mother, but she was having a difficult time dealing with her mother's rapid decline. The Chaplain provided the ministry of presence and the comfort of spiritual care.    Chaplain Charlett Lango D.Min, M.Div.  Chaplain can be reached by calling the operator at [facility]  [phone]

## 2019-11-13 ENCOUNTER — Inpatient Hospital Stay: Payer: MEDICARE | Primary: Adult Health

## 2019-11-13 ENCOUNTER — Encounter: Primary: Adult Health

## 2019-11-13 ENCOUNTER — Inpatient Hospital Stay: Admit: 2019-11-13 | Payer: MEDICARE | Primary: Adult Health

## 2019-11-13 LAB — METABOLIC PANEL, BASIC
Anion gap: 6 mmol/L (ref 5–15)
BUN/Creatinine ratio: 11 — ABNORMAL LOW (ref 12–20)
BUN: 11 mg/dL (ref 6–20)
CO2: 26 mmol/L (ref 21–32)
Calcium: 8.8 mg/dL (ref 8.5–10.1)
Chloride: 110 mmol/L — ABNORMAL HIGH (ref 97–108)
Creatinine: 0.98 mg/dL (ref 0.55–1.02)
GFR est AA: >60 mL/min/{1.73_m2} (ref >60)
GFR est non-AA: 57 mL/min/{1.73_m2} — ABNORMAL LOW (ref >60)
Glucose: 132 mg/dL — ABNORMAL HIGH (ref 65–100)
Potassium: 3.6 mmol/L (ref 3.5–5.1)
Sodium: 142 mmol/L (ref 136–145)

## 2019-11-13 LAB — GLUCOSE, POC
Glucose (POC): 223 mg/dL — ABNORMAL HIGH (ref 65–100)
Glucose (POC): 342 mg/dL — ABNORMAL HIGH (ref 65–100)
Glucose (POC): 138 mg/dL — ABNORMAL HIGH (ref 65–100)
Glucose (POC): 153 mg/dL — ABNORMAL HIGH (ref 65–100)
Glucose (POC): 197 mg/dL — ABNORMAL HIGH (ref 65–100)
Glucose (POC): 182 mg/dL — ABNORMAL HIGH (ref 65–100)

## 2019-11-13 LAB — CBC W/O DIFF
HCT: 32.4 % — ABNORMAL LOW (ref 35.0–47.0)
HGB: 10.6 g/dL — ABNORMAL LOW (ref 11.5–16.0)
MCH: 29.8 PG (ref 26.0–34.0)
MCHC: 32.7 g/dL (ref 30.0–36.5)
MCV: 91 FL (ref 80.0–99.0)
MPV: 10 FL (ref 8.9–12.9)
PLATELET: 249 10*3/uL (ref 150–400)
RBC: 3.56 M/uL — ABNORMAL LOW (ref 3.80–5.20)
RDW: 13.6 % (ref 11.5–14.5)
WBC: 6.7 10*3/uL (ref 3.6–11.0)

## 2019-11-13 LAB — BLOOD GAS, ARTERIAL
BASE EXCESS: 2.6 mmol/L — ABNORMAL HIGH (ref 0–2)
BICARBONATE: 27 mmol/L — ABNORMAL HIGH (ref 22–26)
Base Excess: 2.6 mmol/L — ABNORMAL HIGH (ref 0–2)
EPAP/CPAP/PEEP: 5
EPAP/CPAP/PEEP: 5
FIO2: 35 %
FIO2: 35 %
HCO3: 27 mmol/L — ABNORMAL HIGH (ref 22–26)
IPAP/PIP: 0
IPAP/PIP: 0
O2 SAT: 100 % (ref >95)
O2 Sat: 100 % (ref >95)
PCO2: 31 mmHg — ABNORMAL LOW (ref 35–45)
PCO2: 31 mmHg — ABNORMAL LOW (ref 35–45)
PO2: 151 mmHg — ABNORMAL HIGH (ref 75–100)
PO2: 151 mmHg — ABNORMAL HIGH (ref 75–100)
SET RATE: 12
Set Rate: 12
Tidal Volume: 450
Tidal volume: 450
pH: 7.53 — ABNORMAL HIGH (ref 7.35–7.45)
pH: 7.53 — ABNORMAL HIGH (ref 7.35–7.45)

## 2019-11-13 LAB — CULTURE, RESPIRATORY/SPUTUM/BRONCH W GRAM STAIN
GRAM STAIN: NONE SEEN
Gram Stain Result: NONE SEEN

## 2019-11-13 LAB — MAGNESIUM
Magnesium: 1.4 mg/dL — ABNORMAL LOW (ref 1.6–2.4)
Magnesium: 1.4 mg/dL — ABNORMAL LOW (ref 1.6–2.4)

## 2019-11-13 LAB — BASIC METABOLIC PANEL
Anion Gap: 6 mmol/L (ref 5–15)
BUN: 11 mg/dL (ref 6–20)
Bun/Cre Ratio: 11 — ABNORMAL LOW (ref 12–20)
CO2: 26 mmol/L (ref 21–32)
Calcium: 8.8 mg/dL (ref 8.5–10.1)
Chloride: 110 mmol/L — ABNORMAL HIGH (ref 97–108)
Creatinine: 0.98 mg/dL (ref 0.55–1.02)
EGFR IF NonAfrican American: 57 mL/min/{1.73_m2} — ABNORMAL LOW (ref >60)
GFR African American: >60 mL/min/{1.73_m2} (ref >60)
Glucose: 132 mg/dL — ABNORMAL HIGH (ref 65–100)
Potassium: 3.6 mmol/L (ref 3.5–5.1)
Sodium: 142 mmol/L (ref 136–145)

## 2019-11-13 LAB — POCT GLUCOSE
POC Glucose: 223 mg/dL — ABNORMAL HIGH (ref 65–100)
POC Glucose: 342 mg/dL — ABNORMAL HIGH (ref 65–100)
POC Glucose: 138 mg/dL — ABNORMAL HIGH (ref 65–100)
POC Glucose: 153 mg/dL — ABNORMAL HIGH (ref 65–100)
POC Glucose: 197 mg/dL — ABNORMAL HIGH (ref 65–100)
POC Glucose: 182 mg/dL — ABNORMAL HIGH (ref 65–100)

## 2019-11-13 LAB — CBC
Hematocrit: 32.4 % — ABNORMAL LOW (ref 35.0–47.0)
Hemoglobin: 10.6 g/dL — ABNORMAL LOW (ref 11.5–16.0)
MCH: 29.8 PG (ref 26.0–34.0)
MCHC: 32.7 g/dL (ref 30.0–36.5)
MCV: 91 FL (ref 80.0–99.0)
MPV: 10 FL (ref 8.9–12.9)
Platelets: 249 10*3/uL (ref 150–400)
RBC: 3.56 M/uL — ABNORMAL LOW (ref 3.80–5.20)
RDW: 13.6 % (ref 11.5–14.5)
WBC: 6.7 10*3/uL (ref 3.6–11.0)

## 2019-11-13 MED ORDER — POTASSIUM CHLORIDE 10 MEQ/100 ML IV PIGGY BACK
10 mEq/0 mL | INTRAVENOUS | Status: AC
Start: 2019-11-13 — End: 2019-11-13
  Administered 2019-11-13 (×4): via INTRAVENOUS

## 2019-11-13 MED ORDER — INSULIN GLARGINE 100 UNIT/ML INJECTION
100 unit/mL | Freq: Every evening | SUBCUTANEOUS | Status: DC
Start: 2019-11-13 — End: 2019-11-16
  Administered 2019-11-14 – 2019-11-16 (×3): via SUBCUTANEOUS

## 2019-11-13 MED ORDER — INSULIN LISPRO 100 UNIT/ML INJECTION
100 unit/mL | Freq: Three times a day (TID) | SUBCUTANEOUS | Status: DC
Start: 2019-11-13 — End: 2019-11-16
  Administered 2019-11-13 – 2019-11-16 (×8): via SUBCUTANEOUS

## 2019-11-13 MED FILL — HEPARIN (PORCINE) 5,000 UNIT/ML IJ SOLN: 5000 unit/mL | INTRAMUSCULAR | Qty: 1

## 2019-11-13 MED FILL — ASPIRIN 81 MG TAB, DELAYED RELEASE: 81 mg | ORAL | Qty: 1

## 2019-11-13 MED FILL — HUMALOG U-100 INSULIN 100 UNIT/ML SUBCUTANEOUS SOLUTION: 100 unit/mL | SUBCUTANEOUS | Qty: 2

## 2019-11-13 MED FILL — HUMALOG U-100 INSULIN 100 UNIT/ML SUBCUTANEOUS SOLUTION: 100 unit/mL | SUBCUTANEOUS | Qty: 3

## 2019-11-13 MED FILL — PROPOFOL 10 MG/ML IV EMUL: 10 mg/mL | INTRAVENOUS | Qty: 50

## 2019-11-13 MED FILL — HUMALOG U-100 INSULIN 100 UNIT/ML SUBCUTANEOUS SOLUTION: 100 unit/mL | SUBCUTANEOUS | Qty: 4

## 2019-11-13 MED FILL — CHLORHEXIDINE GLUCONATE 0.12 % MOUTHWASH: 0.12 % | Qty: 15

## 2019-11-13 MED FILL — MEROPENEM 1 GRAM IV SOLR: 1 gram | INTRAVENOUS | Qty: 1

## 2019-11-13 MED FILL — POTASSIUM CHLORIDE 10 MEQ/100 ML IV PIGGY BACK: 10 mEq/0 mL | INTRAVENOUS | Qty: 100

## 2019-11-13 MED FILL — ATORVASTATIN 40 MG TAB: 40 mg | ORAL | Qty: 1

## 2019-11-13 MED FILL — PROTONIX 40 MG INTRAVENOUS SOLUTION: 40 mg | INTRAVENOUS | Qty: 40

## 2019-11-13 NOTE — Progress Notes (Signed)
The purpose of the visit was to consult with the patient's family concerning the next step of care for the patient's care. The patient's daughter mentioned that her brothers were not accepting of the mothers condition, and were struggling with what decision would be best for the patient. She also expressed that the family would benefit from a meeting with the doctor. The Chaplain listened empathically and reflectively, as well the Chaplain communicated with the nurse, the families desire for a family meeting with the doctor.     Chaplain James R. Johnson D.Min, M.Div.  Chaplain can be reached by calling the operator at [facility]  [phone]

## 2019-11-13 NOTE — Progress Notes (Signed)
Pulmonology and Critical Care Progress Note    Subjective:     Chief Complaint:   Chief Complaint   Patient presents with   ??? Unresponsive          Patient seen and examined in ICU  Overnight events noted    Remains critically ill  Intubated on mechanical ventilation  Currently on assist control rate of 12, tidal volume 450, FiO2 30%, PEEP of 5  ABGs and chest x-ray reviewed  Remains on propofol  Not responding to stimuli per the nurse when taken off sedation  Not requiring vasopressors at this time    Review of Systems:  Review of systems not obtained due to patient factors.    Current Facility-Administered Medications   Medication Dose Route Frequency Provider Last Rate Last Admin   ??? potassium chloride 10 mEq in 100 ml IVPB  10 mEq IntraVENous Q1H Kardar, Ahmed Marin Olp, MD 100 mL/hr at 11/13/19 0821 10 mEq at 11/13/19 6812   ??? insulin glargine (LANTUS) injection 5 Units  5 Units SubCUTAneous QHS Kardar, Ahmed Marin Olp, MD       ??? insulin lispro (HUMALOG) injection 3 Units  3 Units SubCUTAneous TIDPC Kardar, Ahmed Marin Olp, MD   Stopped at 11/13/19 0900   ??? aspirin delayed-release tablet 81 mg  81 mg Oral DAILY Colan Neptune, MD   81 mg at 11/13/19 7517   ??? atorvastatin (LIPITOR) tablet 40 mg  40 mg Oral DAILY Colan Neptune, MD   40 mg at 11/13/19 0017   ??? dextrose 5% lactated ringers infusion  100 mL/hr IntraVENous CONTINUOUS Diona Browner, PA-C 100 mL/hr at 11/12/19 2239 100 mL/hr at 11/12/19 2239   ??? meropenem (MERREM) 1 g in sterile water (preservative free) 20 mL IV syringe  1 g IntraVENous Q12H Kardar, Salvadore Oxford, MD   1 g at 11/13/19 4944   ??? chlorhexidine (PERIDEX) 0.12 % mouthwash 15 mL  15 mL Oral Q12H Kardar, Ahmed Marin Olp, MD   15 mL at 11/13/19 0821   ??? levoFLOXacin (LEVAQUIN) 500 mg in D5W IVPB  500 mg IntraVENous Q48H Kardar, Ahmed Marin Olp, MD 100 mL/hr at 11/12/19 1758 500 mg at 11/12/19 1758    ??? albuterol-ipratropium (DUO-NEB) 2.5 MG-0.5 MG/3 ML  3 mL Nebulization Q6H PRN Mughal, Zahid, MD       ??? propofol (DIPRIVAN) 10 mg/mL infusion  0-50 mcg/kg/min (Order-Specific) IntraVENous TITRATE Forde Radon, MD 8.2 mL/hr at 11/13/19 0444 15 mcg/kg/min at 11/13/19 0444   ??? glucose chewable tablet 16 g  4 Tab Oral PRN Bishop, Krysten, PA-C       ??? dextrose (D50W) injection syrg 12.5-25 g  25-50 mL IntraVENous PRN Bishop, Krysten, PA-C   25 g at 11/11/19 0133   ??? glucagon (GLUCAGEN) injection 1 mg  1 mg IntraMUSCular PRN Bishop, Krysten, PA-C       ??? insulin lispro (HUMALOG) injection   SubCUTAneous AC&HS Bishop, Krysten, PA-C   Stopped at 11/13/19 0730   ??? metoprolol (LOPRESSOR) injection 5 mg  5 mg IntraVENous Q6H PRN Bishop, Krysten, PA-C   5 mg at 11/12/19 1350   ??? sodium chloride (NS) flush 5-40 mL  5-40 mL IntraVENous Q8H Bishop, Krysten, PA-C   10 mL at 11/13/19 9675   ??? sodium chloride (NS) flush 5-40 mL  5-40 mL IntraVENous PRN Bishop, Krysten, PA-C   10 mL at 11/11/19 0134   ??? acetaminophen (TYLENOL) tablet 650 mg  650 mg Oral  Q6H PRN Margie Ege, PA-C        Or   ??? acetaminophen (TYLENOL) suppository 650 mg  650 mg Rectal Q6H PRN Margie Ege, PA-C       ??? polyethylene glycol (MIRALAX) packet 17 g  17 g Oral DAILY PRN Bishop, Krysten, PA-C       ??? ondansetron (ZOFRAN ODT) tablet 4 mg  4 mg Oral Q8H PRN Bishop, Krysten, PA-C        Or   ??? ondansetron (ZOFRAN) injection 4 mg  4 mg IntraVENous Q6H PRN Bishop, Krysten, PA-C   4 mg at 11/10/19 2346   ??? heparin (porcine) injection 5,000 Units  5,000 Units SubCUTAneous Q8H Bishop, Krysten, PA-C   5,000 Units at 11/13/19 0634            No Known Allergies        Objective:     Blood pressure (!) 143/92, pulse 97, temperature 97.4 ??F (36.3 ??C), resp. rate 12, height 5' 0.98" (1.549 m), weight 65.6 kg (144 lb 10 oz), SpO2 100 %. Temp (24hrs), Avg:97.7 ??F (36.5 ??C), Min:97.3 ??F (36.3 ??C), Max:98.2 ??F (36.8 ??C)      Intake and Output:   Current Shift: No intake/output data recorded.  Last 3 Shifts: 01/02 1901 - 01/04 0700  In: 1290 [I.V.:1290]  Out: 3175 [Urine:3175]    Physical Exam:    General:  Lying in bed, intubated on mechanical ventilation  Throat and Neck: Supple  Lung:  Patient has good air entry bilaterally.  No wheezing was appreciated.   Heart: S1+S2.  No murmurs, tachycardia  Abdomen: soft, non-tender. Bowel sounds normal. No masses; obese  Extremities: No edema  GU: Not done  Skin: No cyanosis  Neurologic:  Sedated on mechanical ventilation  Psychiatric:  Unable to perform due to patient's condition    Lab/Data Review:  Recent Results (from the past 24 hour(s))   GLUCOSE, POC    Collection Time: 11/12/19 11:14 AM   Result Value Ref Range    Glucose (POC) 188 (H) 65 - 100 mg/dL    Performed by Donney Rankins    GLUCOSE, POC    Collection Time: 11/12/19  1:27 PM   Result Value Ref Range    Glucose (POC) 186 (H) 65 - 100 mg/dL    Performed by Yolande Jolly    GLUCOSE, POC    Collection Time: 11/12/19  4:09 PM   Result Value Ref Range    Glucose (POC) 238 (H) 65 - 100 mg/dL    Performed by Donney Rankins    GLUCOSE, POC    Collection Time: 11/12/19  8:52 PM   Result Value Ref Range    Glucose (POC) 223 (H) 65 - 100 mg/dL    Performed by [facility]    GLUCOSE, POC    Collection Time: 11/13/19 12:50 AM   Result Value Ref Range    Glucose (POC) 342 (H) 65 - 100 mg/dL    Performed by [facility] [place]    BLOOD GAS, ARTERIAL    Collection Time: 11/13/19  3:45 AM   Result Value Ref Range    pH 7.53 (H) 7.35 - 7.45      PCO2 31 (L) 35 - 45 mmHg    PO2 151 (H) 75 - 100 mmHg    O2 SAT 100 >95 %    BICARBONATE 27 (H) 22 - 26 mmol/L    BASE EXCESS 2.6 (H) 0 - 2 mmol/L    O2 METHOD  VENT      FIO2 35.0 %    MODE Assist Control/Volume Control      Tidal volume 450      SET RATE 12      IPAP/PIP 0      EPAP/CPAP/PEEP 5.0      Sample source Arterial      SITE Right Radial      ALLEN'S TEST PASS     GLUCOSE, POC     Collection Time: 11/13/19  3:54 AM   Result Value Ref Range    Glucose (POC) 138 (H) 65 - 100 mg/dL    Performed by [facility] [place]    CBC W/O DIFF    Collection Time: 11/13/19  4:00 AM   Result Value Ref Range    WBC 6.7 3.6 - 11.0 K/uL    RBC 3.56 (L) 3.80 - 5.20 M/uL    HGB 10.6 (L) 11.5 - 16.0 g/dL    HCT 32.4 (L) 35.0 - 47.0 %    MCV 91.0 80.0 - 99.0 FL    MCH 29.8 26.0 - 34.0 PG    MCHC 32.7 30.0 - 36.5 g/dL    RDW 13.6 11.5 - 14.5 %    PLATELET 249 150 - 400 K/uL    MPV 10.0 8.9 - 74.2 FL   METABOLIC PANEL, BASIC    Collection Time: 11/13/19  4:00 AM   Result Value Ref Range    Sodium 142 136 - 145 mmol/L    Potassium 3.6 3.5 - 5.1 mmol/L    Chloride 110 (H) 97 - 108 mmol/L    CO2 26 21 - 32 mmol/L    Anion gap 6 5 - 15 mmol/L    Glucose 132 (H) 65 - 100 mg/dL    BUN 11 6 - 20 mg/dL    Creatinine 0.98 0.55 - 1.02 mg/dL    BUN/Creatinine ratio 11 (L) 12 - 20      GFR est AA >60 >60 ml/min/1.79m2    GFR est non-AA 57 (L) >60 ml/min/1.69m2    Calcium 8.8 8.5 - 10.1 mg/dL   MAGNESIUM    Collection Time: 11/13/19  4:00 AM   Result Value Ref Range    Magnesium 1.4 (L) 1.6 - 2.4 mg/dL     chest X-ray      XR CHEST PORT   Final Result   Impression: Underexpanded lungs with bibasilar atelectasis.  No significant   interval change.         CTA HEAD   Final Result   IMPRESSION:   1.  No evidence of acute intracranial process on noncontrast CT head.   2.  No large vessel occlusion, hemodynamically significant luminal stenosis or   intracranial aneurysm.      XR CHEST PORT   Final Result   Impression:    No acute cardiopulmonary process.      NM LUNG SCAN PERF   Final Result   Impression:   Normal perfusion scintigraphy.         XR CHEST SNGL V   Final Result   IMPRESSION:   1.  The endotracheal tube tip overlies the tracheal airway approximately 37 mm   above the carina.   2.  The lungs are clear without acute pulmonary parenchymal pathology.        XR CHEST SNGL V   Final Result   IMPRESSION:    1.  The lung parenchyma is clear without acute pulmonary parenchymal pathology.   2.  There is milder ectasia of the thoracic aorta related to mild   atherosclerotic vascular changes.        CT HEAD WO CONT   Final Result   IMPRESSION:    1.  This examination is negative for acute intracranial pathology.   2.  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   3.  There is apparent proptosis more pronounced on the right compared to left.                CT SPINE CERV WO CONT   Final Result   Impression:    1.  This examination is negative for an acute fracture or a subluxation injury   to the cervical spine.   2.  There is moderate degenerative disc disease at the C5-C6 level.   3.  There is right-sided facet arthropathy at the C2-C3 and the C3-C4 levels.   There is narrowing of the right neural foramen at C3-C4 level. There is   left-sided facet arthropathy primarily at the C2-C3 C4-C5 levels. There is   narrowing of the left C4-C5 neural foramen.            MRI BRAIN W WO CONT    (Results Pending)   XR CHEST PORT    (Results Pending)   CT HEAD WO CONT    (Results Pending)       CT Results  (Last 48 hours)               11/12/19 1426  CTA HEAD Final result    Impression:  IMPRESSION:   1.  No evidence of acute intracranial process on noncontrast CT head.   2.  No large vessel occlusion, hemodynamically significant luminal stenosis or   intracranial aneurysm.       Narrative:  CTA HEAD WITHOUT AND WITH INTRAVENOUS CONTRAST       INDICATION: Altered mental status, rule out stroke versus metastasis       COMPARISON: Noncontrast CT head from 11/10/2019       TECHNIQUE: Noncontrast head CT. CTA of the head was performed after   administration of 100 mL of Isovue-370 intravenous contrast. Coronal and   sagittal reconstructions performed and evaluated. Dose reduction: Per department   policy, all CT scans at this facility are performed using dose reduction    optimization techniques as appropriate to a performed examination including the   following: Automated exposure control, adjustments of the mA and/or KV according   to patient size, or use of iterative reconstruction technique.       FINDINGS:   Scout image shows endotracheal and enteric tubes. Fluid in the nasal cavity and   nasopharynx is likely related.       No evidence of acute intracranial hemorrhage or mass effect on noncontrast CT   head. Patchy areas of white matter hypoattenuation are present, nonspecific,   likely from chronic small vessel ischemic changes. Mild to moderate diffuse   parenchymal volume loss with commensurate ventricular enlargement. No   hydrocephalus. Foci of hypodensity within bilateral thalami are likely from   remote ischemic insults. Probable remote lacunar infarction of the posterior   limb of internal capsule. No abnormal extra-axial fluid collections. Mild   mucosal thickening of the paranasal sinuses. Moderate opacification of the left   tympanomastoid cavity. No acute calvarial abnormality.       Mild calcified atherosclerosis of the intracranial internal carotid arteries  without focal hemodynamically significant luminal stenosis. Right ophthalmic   artery is patent. Right anterior cerebral artery is patent. Branches of the   right middle cerebral artery are patent as well.       Mild calcified atherosclerosis of the left cavernous ICA. No hemodynamically   significant luminal stenosis. Left anterior cerebral artery and middle cerebral   artery are widely patent.       Right vertebral artery is dominant. Small intradural left vertebral artery,   which is further reduced in caliber distal to the left by cup. Basilar artery is   widely patent. Bilateral posterior cerebral arteries are patent.                 Assessment:      1.  A 69 year old African-American female with multiple medical problems including history of prior stroke and lung cancer who has been on chemotherapy, last chemotherapy in December 2020 , presented with unresponsiveness.      1.  Acute respiratory failure with hypoxia:  She was intubated enroute for airway protection.  Currently, she is off propofol.  She is minimally responsive, only opening eyes.  Neurology is consulted.  CT of the head and spine did not show any acute process.    No metastasis were noted.  Chest x-ray does not show any focal infiltrates.  There is some mild prominence to the left hilum.  Apparently, she has a history of lung cancer.     Currently on assist control as outlined above  ABGs and chest x-ray reviewed  Ventilator settings modified, FiO2 decreased to 24%  Further changes in ventilator settings based on clinical response and ABG results    2.  Altered mental status.    As discussed above,   Neurology is consulted.    Neurology noted that patient was posturing on right side.  MRI of the brain and CT scan of head with contrast was ordered by neurology service.    3.  History of lung cancer.    Patient has received last chemotherapy in December 2020, details are not available.    Chest x-ray, however, does not show any significant mass or nodule or other chronic changes.    4.  History of stroke.  5.  Diabetes mellitus.    6.  Acute renal insufficiency.   It has resolved.  Most probably it was volume contraction.   I will continue her some IV fluids and continue to monitor.    7.  For nutrition, she can be started on Glucerna.  We will get a nutritionist input as well.    For deep venous thrombosis prophylaxis, the patient is started on heparin subcutaneously, which will be continued.  For gastrointestinal stress prophylaxis, she is started on Protonix IV which will be continued.    Overall condition remains tenuous  Prognosis guarded  We will check ABG and chest x-ray in a.m.     Questions of patient were answered at bedside in detail  Case discussed in detail with RN, RT, and care team    Thank you for involving me in the care of this patient  I will follow with you closely during hospitalization    Time spent more than 50 minutes in direct patient care with no overlap reviewing results and records, decision-making, and answering questions.      Thompson GrayerSuhail Journey Ratterman, MD  [facility]  11/13/2019

## 2019-11-13 NOTE — Wound Image (Signed)
IP WOUND CONSULT    [facility]  MEDICAL RECORD NUMBER:  [id number]  AGE: 69 y.o.   GENDER: female  DOB: 03/27/1951  TODAY'S DATE:  11/13/2019    GENERAL     []  Follow-up   [x]  New Consult    Kristin Cooper is a 69 y.o. female referred by:   []  Physician  [x]  Nursing  []  Other:         PAST MEDICAL HISTORY    Past Medical History:   Diagnosis Date   ??? Lung cancer (HCC)         PAST SURGICAL HISTORY    No past surgical history on file.    FAMILY HISTORY    No family history on file.      ALLERGIES    No Known Allergies    MEDICATIONS    No current facility-administered medications on file prior to encounter.      Current Outpatient Medications on File Prior to Encounter   Medication Sig Dispense Refill   ??? atorvastatin (LIPITOR) 40 mg tablet Take 40 mg by mouth daily.     ??? glimepiride (AMARYL) 4 mg tablet Take 4 mg by mouth every morning.     ??? lisinopriL (PRINIVIL, ZESTRIL) 40 mg tablet Take 40 mg by mouth daily.     ??? amLODIPine (Norvasc) 10 mg tablet Take 10 mg by mouth daily.     ??? clonazePAM (KlonoPIN) 0.5 mg tablet Take 0.5 mg by mouth three (3) times daily.     ??? traMADoL (ULTRAM) 50 mg tablet Take 50 mg by mouth every six (6) hours as needed for Pain.     ??? insulin glargine (Lantus Solostar U-100 Insulin) 100 unit/mL (3 mL) inpn 40 Units by SubCUTAneous route nightly.           @OBJNOHEADERBEGIN @  Visit Vitals  BP (!) 137/94 (BP 1 Location: Left arm, BP Patient Position: At rest;Head of bed elevated (Comment degrees))   Pulse (!) 106   Temp 97.4 ??F (36.3 ??C)   Resp 12   Ht 5' 0.98" (1.549 m)   Wt 65.6 kg (144 lb 10 oz)   SpO2 100%   BMI 27.34 kg/m??       ASSESSMENT     Wound Identification: sDTI/DU to left heel   Dressing change: Heel boots  Verbal consent for picture: patient intubated    Contributing Factors: diabetes, poor glucose control, decreased mobility, shear force and malnutrition    Wound Heel Left blister to left heel, pt has hx of diabetes, per daughter (Active)    Wound Image    11/13/19 1217   Wound Etiology Deep Tissue/Injury 11/13/19 1217   Offloading for Diabetic Foot Ulcers Offloading boot 11/13/19 1217   Wound Length (cm) 9.5 cm 11/13/19 1217   Wound Width (cm) 9 cm 11/13/19 1217   Wound Surface Area (cm^2) 85.5 cm^2 11/13/19 1217   Change in Wound Size % -5.56 11/13/19 1217   Wound Assessment Dry;Eschar dry;Purple/maroon 11/13/19 1217   Drainage Amount None 11/13/19 1217   Wound Odor None 11/13/19 1217   Peri-Wound/Incision Assessment Dry/flaky;Fragile 11/13/19 1217   Edges Undefined edges 11/13/19 1217   Wound Thickness Description Full thickness 11/13/19 1217   Number of days: 3          PLAN     Skin Care & Pressure Relief Recommendations  Speciality bed Total Care Sport with Air while in ICU  Minimize layers of linen  Turn/reposition approximately every 2 hours  Pillow wedges  Manage incontinence   Promote continence; Skin Protective lotion/cream to buttocks and sacrum daily and as needed with incontinence care  Offloading boots    Physician/Provider notified:   Recommendations: Continue with heel boots to bilateral feet for offloading of the heel.  sDTI to left heel is still intact.  More eschar is noted than previous assessment in ED.  BG this morning at 342.  Weak dorsalis pedis and posterior tibial pulses to left foot.  Extremity cool.  Will continue to monitor.  Re-consult [facility] if wound opens.  Small sacral scar present.  Sacral skin intact.  Absorbent pad was wet, foley catheter may be leaking.  Total Care Sport with air while in ICU.  Will need pressure reduction mattress when transfers to med-surg unit.  Turn and reposition q2h to offload sacral region.      Teaching completed with:   []  Patient           []  Family member       []  Caregiver          []  Nursing  []  Other    Patient/Caregiver Teaching:  Level of patient/caregiver understanding able to:   []  Indicates understanding       []  Needs reinforcement  []  Unsuccessful      []  Verbal Understanding   []  Demonstrated understanding       []  No evidence of learning  []  Refused teaching         []  N/A       Electronically signed by Army Melia, RN on 11/13/2019 at 12:20 PM

## 2019-11-13 NOTE — Progress Notes (Signed)
Hospitalist Progress Note    NAME: Kristin Cooper   DOB:  08-26-1951   MRN:  [id number]       Subjective:     Chief Complaint / Reason for Physician Visit  Patient seen and evaluated at bedside, patient remains intubated and sedated at this time, overnight events reviewed.  Discussed with RN events overnight.     Review of Systems:  Symptom Y/N Comments  Symptom Y/N Comments   Fever/Chills    Chest Pain     Poor Appetite    Edema     Cough    Abdominal Pain     Sputum    Joint Pain     SOB/DOE    Pruritis/Rash     Nausea/vomit    Tolerating PT/OT     Diarrhea    Tolerating Diet     Constipation    Other       Could NOT obtain due to:  Unable to obtain secondary to patient's clinical status     Objective:     VITALS:   Last 24hrs VS reviewed since prior progress note. Most recent are:  Patient Vitals for the past 24 hrs:   Temp Pulse Resp BP SpO2   11/13/19 0701 ??? 97 12 (!) 143/92 100 %   11/13/19 0600 ??? 96 12 (!) 150/96 100 %   11/13/19 0500 ??? 95 12 (!) 141/93 100 %   11/13/19 0400 ??? 92 12 (!) 140/90 100 %   11/13/19 0300 97.7 ??F (36.5 ??C) (!) 104 12 100/80 100 %   11/13/19 0200 ??? 96 12 (!) 147/94 100 %   11/13/19 0100 ??? 93 12 125/86 100 %   11/13/19 0000 97.3 ??F (36.3 ??C) 95 12 (!) 142/98 100 %   11/12/19 2300 ??? 98 12 (!) 139/91 100 %   11/12/19 2200 ??? (!) 102 12 131/80 100 %   11/12/19 2101 ??? 96 12 126/88 100 %   11/12/19 2000 97.7 ??F (36.5 ??C) 98 12 (!) 139/93 100 %   11/12/19 1903 ??? 100 12 (!) 146/109 100 %   11/12/19 1700 ??? 89 12 (!) 167/93 100 %   11/12/19 1602 ??? 87 12 ??? 100 %   11/12/19 1600 ??? 90 12 (!) 142/93 100 %   11/12/19 1500 98.1 ??F (36.7 ??C) 85 12 (!) 142/93 100 %   11/12/19 1400 ??? 86 12 (!) 155/84 100 %   11/12/19 1350 ??? (!) 113 ??? (!) 162/109 ???   11/12/19 1300 ??? (!) 110 12 (!) 156/97 100 %   11/12/19 1200 ??? (!) 112 12 (!) 162/109 100 %   11/12/19 1107 ??? (!) 105 13 ??? 100 %   11/12/19 1100 98.2 ??F (36.8 ??C) (!) 109 12 (!) 143/106 100 %   11/12/19 1000 ??? (!) 108 12 (!) 149/94 100 %    11/12/19 0900 ??? (!) 111 12 (!) 158/97 100 %   11/12/19 0800 ??? (!) 113 12 (!) 156/94 100 %   11/12/19 0753 ??? (!) 124 12 ??? 100 %       Intake/Output Summary (Last 24 hours) at 11/13/2019 0725  Last data filed at 11/13/2019 0600  Gross per 24 hour   Intake 1290 ml   Output 2475 ml   Net -1185 ml        PHYSICAL EXAM:  General: Patient appears comfortable, currently remains intubated and sedated   EENT:  EOMI. Anicteric sclerae. MMM  Resp:  Decreased air entry  bilaterally in bilateral lower lung zone  CV:  Regular  rhythm,  No edema  GI:  Soft, Non distended, Non tender.  +Bowel sounds  Neurologic:  Limited Secondary to patient's clinical status  Psych:   Limited secondary to patient's clinical status  Skin:  No rashes.  No jaundice    Procedures: see electronic medical records for all procedures/Xrays and details which were not copied into this note but were reviewed prior to creation of Plan.      LABS:  I reviewed today's most current labs and imaging studies.  Pertinent labs include:  Recent Labs     11/11/19  0445 11/10/19  1150   WBC 8.9 11.9*   HGB 11.0* 13.6   HCT 34.6* 41.8   PLT 241 305     Recent Labs     11/11/19  0445 11/10/19  1150   NA 149* 147*   K 3.6 4.1   CL 122* 116*   CO2 22 22   GLU 121* 146*   BUN 25* 37*   CREA 1.29* 2.01*   CA 9.1 10.2*   MG  --  2.2   ALB  --  3.8   TBILI  --  1.0   ALT  --  23   INR  --  1.1       Signed: Alicea Wente Ned Card, MD    V/Q scan:IMPRESSION  Impression:  Normal perfusion scintigraphy.    CT head without contrast:IMPRESSION  IMPRESSION:   1.  This examination is negative for acute intracranial pathology.  2.  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  3.  There is apparent proptosis more pronounced on the right compared to left.    Xray chest:IMPRESSION  IMPRESSION:  1.  The endotracheal tube tip overlies the tracheal airway approximately 37 mm  above the carina.   2.  The lungs are clear without acute pulmonary parenchymal pathology.    ??    Reviewed most current lab test results and cultures  YES  Reviewed most current radiology test results   YES  Review and summation of old records today    NO  Reviewed patient's current orders and MAR    YES  PMH/SH reviewed - no change compared to H&P      Assessment / Plan:  Altered mental status???of note patient presented with altered mental status due to unclear etiology, could be secondary to hypoglycemia versus seizure activity versus metabolic encephalopathy, patient was seizure-free overnight, patient CT head was negative for any acute intracranial pathologies  Continue to monitor mental status  Follow up CT angio for further evaluation  Neurology consult appreciated will continue to follow    Acute respiratory failure/aspiration pneumonia???resented with above-mentioned symptomatology and was emergently intubated secondary to airway protection, with significant concerns for aspiration pneumonia, patient does not meet sepsis criteria  Follow-up blood cultures  Follow-up sputum cultures  Meropenem and Levaquin for antibiotic coverage  Attempt weaning trial this AM  Pulmonology consult appreciated, will continue to follow    Hyperglycemia due to type 2 diabetes - initiate lantus/lispro according to 24 hour coverage  Additional sliding scale for further coverage    Hypokalemia-replete K and recheck K    Prophylaxis???Heparin subcu tonics, chlorhexidine mouthwash, Protonix once daily  Critical care time spent 50 minutes involving direct patient care as well as reviewing patient's labs and coordination of care with nursing staff  18.5 - 24.9 Normal weight / Body mass index is 26.87 kg/m??.    Code status: Full  Prophylaxis: Lovenox  Recommended Disposition: [facility]/Rehab     ________________________________________________________________________  Care Plan discussed with:    Comments   Patient     Family  X    RN x    Care Manager X     Consultant  X                     x Multidiciplinary team rounds were held today with case manager, nursing, pharmacist and clinical coordinator.  Patient's plan of care was discussed; medications were reviewed and discharge planning was addressed.     ________________________________________________________________________    Total CRITICAL CARE TIME Spent: 50   Minutes non procedure based      Comments   >50% of visit spent in counseling and coordination of care X    ________________________________________________________________________  Alisia Ferrari, MD

## 2019-11-13 NOTE — Progress Notes (Signed)
Bedside and Verbal shift change report given to E-beth Turner RN (oncoming nurse) by Cora Marasigan RN (offgoing nurse). Report included the following information SBAR.

## 2019-11-13 NOTE — Other (Signed)
Ethics was consulted by ICU nursing regarding complicated family dynamics and decision making on behalf of patient.  This review was conducted via phone conversation with ICU nurse and review of the medical record.    Patient can not make decisions for themselves, does not have any ACP documentation, but has three children.  Daughter and son both claim to be POA for patient and staff are awaiting paperwork proving they are POA.  Without paperwork identifying who the POA is, decision maker is identified by [employer].   In this situation decision making would be split between three adult children, with a majority helping to settle any disputes.      In the event that POA documentation is found and decision maker identified, it is encouraged that other children be kept involved in care as possible.       Thank you for including Ethics in this case.  If there are other concerns or if situation evolves please feel free to call me directly.     Loletta Specter  Director of Mission   [phone]

## 2019-11-13 NOTE — Progress Notes (Addendum)
Reason for Admission:   Unresponsiveness                   RUR Score:          11%           Plan for utilizing home health:      Unknown at this time.    PCP: First and Last name:   Nafziger, Cory, NP [phone]   Name of Practice:    Are you a current patient: Yes/No:    Approximate date of last visit:    Can you participate in a virtual visit with your PCP:                     Current Advanced Directive/Advance Care Plan: Patient's POA is her daughter, Kristin Cooper [phone].                         Transition of Care Plan:                    CM spoke with patient's daughter, Kristin, for DCP assessment. Patient lives with Kristin in a 2 floor home. Patient is wheelchair bound, also has a bedside commode and a hospital bed was prescribed for the patient on 12/23, however patient ended up In the hospital before the bed was delivered. Prior to this hospitalization, patient was mostly independent with ADLS, daughter assisted when needed.    Patient's discharge plan is pending clinical course at this time. Ultimately, patient's daughter would like her to be able to return home eventually but is open to SNF or rehab if needed.

## 2019-11-13 NOTE — Progress Notes (Signed)
NEUROLOGY  PROGRESS NOTE    Admission History/Pertinent Events  Kristin Cooper is a 69 y.o. year old female who presented on 11/10/2019. Patient has a past medical history of Lung Cancer, HTN, DM, HL who initially presented after being found unresponsive by family.  Per chart review, patient had several episodes of unresponsiveness on the week prior to presentation.  Per daughter's report.  Patient reportedly has behavioral arrest and then has staring episodes with decreased responsiveness.  On day of presentation, patient had a similar episode but then had vomiting thereafter.  Patient became more responsive than usual and family members reportedly started CPR.  EMS came to find patient had a good pulse but intubated patient and gave her naloxone due to concerns for airway protection.  Patient's glucose is initially was found to be 53. Emergent CT head was negative for any acute findings.  Chest x-ray concerning for possible aspiration pneumonia for which patient was started on IV antibiotics.    Home Stroke Prophylaxis: Atorvastatin 40      ASSESSMENT/PLAN      Impression  Will follow up on contrasted MRI of the brain to evaluate for ischemia, CNS infection, leptomeningeal enhancement or basal gangliar abnormal signal which would indicate hypoglycemic insult.  Will continue to monitor patient closely.  Patient not following any commands on examination and has disconjugate gaze.  Patient reacts more to pain stimulation in bilateral upper extremities in the bilateral lower extremities and has some posturing in the right upper extremity to pain stem.  Will obtain stat CT head to evaluate for cerebral edema or impending herniation given extensor posturing of the right upper extremity.    Prognosis is quite guarded at the moment.      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    MRI Brain WWO  []         Plan      1) Encephalopathy   Associated: Lung Cancer, Hypoglycemia, PNA  -Q4NeuroChecks, TELE  -Seizure Precautions  -No AEDs at the current time  -STAT IV lorazepam 2 mg with any clinical seizure activity lasting greater than 3 minutes and contact Neurology for further recommendations  -Management of infectious/metabolic derangements to referring teams  -We will consider EEG if patient's MRI without significant findings and patient remains encephalopathic  -STAT CTH WO to evaluate for cerebral edema or impending herniation given RUE extensor posturing on exam  -F/U MRI Brain WWO    2) Remote Basal Gangliar Infarcts  -Stroke Prophylaxis: ASA 81, Atorvastatin 40  -SBP Goal < 160        SUBJECTIVE   Patient currently on a propofol drip, levofloxacin and meropenem.  Not following any commands on exam.  Patient has disconjugate gaze with extensor posturing in the right upper extremity to pain stem.  Decreased responsiveness to pain the left lower extremity.      Physical/Neurological Exam  [name],  Cardiovascular: normal rate and rhythm  Pulmonary: CTAB    Patient intubated  Patient does not follow any central or peripheral commands  Does not attempt to speak  Disconjugate gaze  Pupils react to light bilaterally  Oculocephalics intact  Corneal reflex intact bilaterally  No BTT bilaterally  No facial droop  Cough and gag are intact  Tongue is midline under ETT  Motor   LUE: 3/5   RUE: extensor posturing   BLE: 1/5 to pain  No abnormal movements  Normal  tone throughout      OBJECTIVE  Vital Signs  Temp:  [97.1 ??F (36.2 ??C)-100 ??F (37.8 ??C)]   Pulse (Heart Rate):  [85-131]   BP: (120-183)/(58-109)   Resp Rate:  [12-22]   O2 Sat (%):  [100 %]   Weight:  [59 kg (130 lb)-64.5 kg (142 lb 3.2 oz)]     MEDICATIONS    Current Facility-Administered Medications:    ???  dextrose 5% lactated ringers infusion, 100 mL/hr, IntraVENous, CONTINUOUS, Reasoner, Robert Z, PA-C, Last Rate: 100 mL/hr at 11/12/19 1013, 100 mL/hr at 11/12/19 1013  ???  meropenem (MERREM) 1 g in sterile water (preservative free) 20 mL IV syringe, 1 g, IntraVENous, Q12H, Kardar, Ahmed Mertie Moores, MD, 1 g at 11/12/19 2725  ???  pantoprazole (PROTONIX) 40 mg in 0.9% sodium chloride 10 mL injection, 40 mg, IntraVENous, DAILY, Kardar, Ahmed Mertie Moores, MD, 40 mg at 11/12/19 3664  ???  chlorhexidine (PERIDEX) 0.12 % mouthwash 15 mL, 15 mL, Oral, Q12H, Kardar, Ahmed Mertie Moores, MD, 15 mL at 11/12/19 0853  ???  levoFLOXacin (LEVAQUIN) 500 mg in D5W IVPB, 500 mg, IntraVENous, Q48H, Kardar, Ahmed Mertie Moores, MD, Last Rate: 100 mL/hr at 11/12/19 1758, 500 mg at 11/12/19 1758  ???  albuterol-ipratropium (DUO-NEB) 2.5 MG-0.5 MG/3 ML, 3 mL, Nebulization, Q6H PRN, Mughal, Zahid, MD  ???  propofol (DIPRIVAN) 10 mg/mL infusion, 0-50 mcg/kg/min (Order-Specific), IntraVENous, TITRATE, Pettis, Marsh Dolly, MD, Last Rate: 8.2 mL/hr at 11/12/19 1356, 15 mcg/kg/min at 11/12/19 1356  ???  glucose chewable tablet 16 g, 4 Tab, Oral, PRN, Bishop, Krysten, PA-C  ???  dextrose (D50W) injection syrg 12.5-25 g, 25-50 mL, IntraVENous, PRN, Bishop, Krysten, PA-C, 25 g at 11/11/19 0133  ???  glucagon (GLUCAGEN) injection 1 mg, 1 mg, IntraMUSCular, PRN, Bishop, Krysten, PA-C  ???  insulin lispro (HUMALOG) injection, , SubCUTAneous, AC&HS, Bishop, Krysten, PA-C, 3 Units at 11/12/19 1758  ???  metoprolol (LOPRESSOR) injection 5 mg, 5 mg, IntraVENous, Q6H PRN, Bishop, Krysten, PA-C, 5 mg at 11/12/19 1350  ???  sodium chloride (NS) flush 5-40 mL, 5-40 mL, IntraVENous, Q8H, Bishop, Krysten, PA-C, 10 mL at 11/12/19 1413  ???  sodium chloride (NS) flush 5-40 mL, 5-40 mL, IntraVENous, PRN, Bishop, Krysten, PA-C, 10 mL at 11/11/19 0134   ???  acetaminophen (TYLENOL) tablet 650 mg, 650 mg, Oral, Q6H PRN **OR** acetaminophen (TYLENOL) suppository 650 mg, 650 mg, Rectal, Q6H PRN, Bishop, Krysten, PA-C  ???  polyethylene glycol (MIRALAX) packet 17 g, 17 g, Oral, DAILY PRN, Bishop, Krysten, PA-C  ???  ondansetron (ZOFRAN ODT) tablet 4 mg, 4 mg, Oral, Q8H PRN **OR** ondansetron (ZOFRAN) injection 4 mg, 4 mg, IntraVENous, Q6H PRN, Bishop, Krysten, PA-C, 4 mg at 11/10/19 2346  ???  heparin (porcine) injection 5,000 Units, 5,000 Units, SubCUTAneous, Q8H, Bishop, Krysten, PA-C, 5,000 Units at 11/12/19 1349      Labs: I've reviewed the labs for today     This document has been prepared by the [employer] voice recognition system, typographical errors may have occurred. Attempts have been made to correct errors, however inadvertent errors may persist.

## 2019-11-13 NOTE — Progress Notes (Signed)
Hospitalist Progress Note    NAME: Kristin Cooper   DOB:  1951/03/20   MRN:  [id number]       Subjective:     Chief Complaint / Reason for Physician Visit  Patient seen and evaluated at bedside, patient remains intubated and sedated at this time, overnight events reviewed.  Discussed with RN events overnight.     Review of Systems:  Symptom Y/N Comments  Symptom Y/N Comments   Fever/Chills    Chest Pain     Poor Appetite    Edema     Cough    Abdominal Pain     Sputum    Joint Pain     SOB/DOE    Pruritis/Rash     Nausea/vomit    Tolerating PT/OT     Diarrhea    Tolerating Diet     Constipation    Other       Could NOT obtain due to:  Unable to obtain secondary to patient's clinical status     Objective:     VITALS:   Last 24hrs VS reviewed since prior progress note. Most recent are:  Patient Vitals for the past 24 hrs:   Temp Pulse Resp BP SpO2   11/13/19 0701 ??? 97 12 (!) 143/92 100 %   11/13/19 0600 ??? 96 12 (!) 150/96 100 %   11/13/19 0500 ??? 95 12 (!) 141/93 100 %   11/13/19 0400 ??? 92 12 (!) 140/90 100 %   11/13/19 0300 97.7 ??F (36.5 ??C) (!) 104 12 100/80 100 %   11/13/19 0200 ??? 96 12 (!) 147/94 100 %   11/13/19 0100 ??? 93 12 125/86 100 %   11/13/19 0000 97.3 ??F (36.3 ??C) 95 12 (!) 142/98 100 %   11/12/19 2300 ??? 98 12 (!) 139/91 100 %   11/12/19 2200 ??? (!) 102 12 131/80 100 %   11/12/19 2101 ??? 96 12 126/88 100 %   11/12/19 2000 97.7 ??F (36.5 ??C) 98 12 (!) 139/93 100 %   11/12/19 1903 ??? 100 12 (!) 146/109 100 %   11/12/19 1700 ??? 89 12 (!) 167/93 100 %   11/12/19 1602 ??? 87 12 ??? 100 %   11/12/19 1600 ??? 90 12 (!) 142/93 100 %   11/12/19 1500 98.1 ??F (36.7 ??C) 85 12 (!) 142/93 100 %   11/12/19 1400 ??? 86 12 (!) 155/84 100 %   11/12/19 1350 ??? (!) 113 ??? (!) 162/109 ???   11/12/19 1300 ??? (!) 110 12 (!) 156/97 100 %   11/12/19 1200 ??? (!) 112 12 (!) 162/109 100 %   11/12/19 1107 ??? (!) 105 13 ??? 100 %   11/12/19 1100 98.2 ??F (36.8 ??C) (!) 109 12 (!) 143/106 100 %   11/12/19 1000 ??? (!) 108 12 (!) 149/94 100 %   11/12/19 0900 ??? (!)  111 12 (!) 158/97 100 %   11/12/19 0800 ??? (!) 113 12 (!) 156/94 100 %   11/12/19 0753 ??? (!) 124 12 ??? 100 %       Intake/Output Summary (Last 24 hours) at 11/13/2019 0725  Last data filed at 11/13/2019 0600  Gross per 24 hour   Intake 1290 ml   Output 2475 ml   Net -1185 ml        PHYSICAL EXAM:  General: Patient appears comfortable, currently remains intubated and sedated   EENT:  EOMI. Anicteric sclerae. MMM  Resp:  Decreased air entry  bilaterally in bilateral lower lung zone  CV:  Regular  rhythm,  No edema  GI:  Soft, Non distended, Non tender.  +Bowel sounds  Neurologic:  Limited Secondary to patient's clinical status  Psych:   Limited secondary to patient's clinical status  Skin:  No rashes.  No jaundice    Procedures: see electronic medical records for all procedures/Xrays and details which were not copied into this note but were reviewed prior to creation of Plan.      LABS:  I reviewed today's most current labs and imaging studies.  Pertinent labs include:  Recent Labs     11/11/19  0445 11/10/19  1150   WBC 8.9 11.9*   HGB 11.0* 13.6   HCT 34.6* 41.8   PLT 241 305     Recent Labs     11/11/19  0445 11/10/19  1150   NA 149* 147*   K 3.6 4.1   CL 122* 116*   CO2 22 22   GLU 121* 146*   BUN 25* 37*   CREA 1.29* 2.01*   CA 9.1 10.2*   MG  --  2.2   ALB  --  3.8   TBILI  --  1.0   ALT  --  23   INR  --  1.1       Signed: Ashanti Littles Ellie Lunch, MD    V/Q scan:IMPRESSION  Impression:  Normal perfusion scintigraphy.    CT head without contrast:IMPRESSION  IMPRESSION:   1.  This examination is negative for acute intracranial pathology.  2.  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  3.  There is apparent proptosis more pronounced on the right compared to left.    Xray chest:IMPRESSION  IMPRESSION:  1.  The endotracheal tube tip overlies the tracheal airway approximately 37 mm  above the carina.  2.  The lungs are clear without acute pulmonary  parenchymal pathology.    ??    Reviewed most current lab test results and cultures  YES  Reviewed most current radiology test results   YES  Review and summation of old records today    NO  Reviewed patient's current orders and MAR    YES  PMH/SH reviewed - no change compared to H&P      Assessment / Plan:  Altered mental status???of note patient presented with altered mental status due to unclear etiology, could be secondary to hypoglycemia versus seizure activity versus metabolic encephalopathy, patient was seizure-free overnight, patient CT head was negative for any acute intracranial pathologies  Continue to monitor mental status  Follow up CT angio for further evaluation  Neurology consult appreciated will continue to follow    Acute respiratory failure/aspiration pneumonia???resented with above-mentioned symptomatology and was emergently intubated secondary to airway protection, with significant concerns for aspiration pneumonia, patient does not meet sepsis criteria  Follow-up blood cultures  Follow-up sputum cultures  Meropenem and Levaquin for antibiotic coverage  Attempt weaning trial this AM  Pulmonology consult appreciated, will continue to follow    Hyperglycemia due to type 2 diabetes - initiate lantus/lispro according to 24 hour coverage  Additional sliding scale for further coverage    Hypokalemia-replete K and recheck K    Prophylaxis???Heparin subcu tonics, chlorhexidine mouthwash, Protonix once daily  Critical care time spent 50 minutes involving direct patient care as well as reviewing patient's labs and coordination of care with nursing staff  18.5 - 24.9 Normal weight / Body mass index is 26.87 kg/m??.    Code status: Full  Prophylaxis: Lovenox  Recommended Disposition: SAH/Rehab     ________________________________________________________________________  Care Plan discussed with:    Comments   Patient     Family  X    RN x    Care Manager X    Consultant  X                     x Multidiciplinary  team rounds were held today with case manager, nursing, pharmacist and clinical coordinator.  Patient's plan of care was discussed; medications were reviewed and discharge planning was addressed.     ________________________________________________________________________    Total CRITICAL CARE TIME Spent: 50   Minutes non procedure based      Comments   >50% of visit spent in counseling and coordination of care X    ________________________________________________________________________  Alisia Ferrari, MD

## 2019-11-13 NOTE — Progress Notes (Signed)
The purpose of the visit was to consult with the patient's family concerning the next step of care for the patient's care. The patient's daughter mentioned that her brothers were not accepting of the mothers condition, and were struggling with what decision would be best for the patient. She also expressed that the family would benefit from a meeting with the doctor. The Chaplain listened empathically and reflectively, as well the Chaplain communicated with the nurse, the families desire for a family meeting with the doctor.     Chaplain Charlett Lango D.Min, M.Div.  Chaplain can be reached by calling the operator at [facility]  [phone]

## 2019-11-13 NOTE — Progress Notes (Signed)
NEUROLOGY  PROGRESS NOTE    Admission History/Pertinent Events  Kristin Cooper is a 69 y.o. year old female who presented on 11/10/2019. Patient has a past medical history of Lung Cancer, HTN, DM, HL who initially presented after being found unresponsive by family.  Per chart review, patient had several episodes of unresponsiveness on the week prior to presentation.  Per daughter's report.  Patient reportedly has behavioral arrest and then has staring episodes with decreased responsiveness.  On day of presentation, patient had a similar episode but then had vomiting thereafter.  Patient became more responsive than usual and family members reportedly started CPR.  EMS came to find patient had a good pulse but intubated patient and gave her naloxone due to concerns for airway protection.  Patient's glucose is initially was found to be 53. Emergent CT head was negative for any acute findings.  Chest x-ray concerning for possible aspiration pneumonia for which patient was started on IV antibiotics.    Home Stroke Prophylaxis: Atorvastatin 40      ASSESSMENT/PLAN      Impression  Will follow up on contrasted MRI of the brain to evaluate for ischemia, CNS infection, leptomeningeal enhancement or basal gangliar abnormal signal which would indicate hypoglycemic insult.  Will continue to monitor patient closely.  Patient not following any commands on examination and has disconjugate gaze.  Patient reacts more to pain stimulation in bilateral upper extremities in the bilateral lower extremities and has some posturing in the right upper extremity to pain stem.  Will obtain stat CT head to evaluate for cerebral edema or impending herniation given extensor posturing of the right upper extremity.    Prognosis is quite guarded at the moment.      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  [facility]    MRI Brain WWO  []$         Plan      1) Encephalopathy  Associated: Lung Cancer,  Hypoglycemia, PNA  -Q4NeuroChecks, TELE  -Seizure Precautions  -No AEDs at the current time  -STAT IV lorazepam 2 mg with any clinical seizure activity lasting greater than 3 minutes and contact Neurology for further recommendations  -Management of infectious/metabolic derangements to referring teams  -We will consider EEG if patient's MRI without significant findings and patient remains encephalopathic  -STAT CTH WO to evaluate for cerebral edema or impending herniation given RUE extensor posturing on exam  -F/U MRI Brain WWO    2) Remote Basal Gangliar Infarcts  -Stroke Prophylaxis: ASA 81, Atorvastatin 40  -SBP Goal < 160        SUBJECTIVE   Patient currently on a propofol drip, levofloxacin and meropenem.  Not following any commands on exam.  Patient has disconjugate gaze with extensor posturing in the right upper extremity to pain stem.  Decreased responsiveness to pain Annie Main the left lower extremity.      Physical/Neurological Exam  [name],  Cardiovascular: normal rate and rhythm  Pulmonary: CTAB    Patient intubated  Patient does not follow any central or peripheral commands  Does not attempt to speak  Disconjugate gaze  Pupils react to light bilaterally  Oculocephalics intact  Corneal reflex intact bilaterally  No BTT bilaterally  No facial droop  Cough and gag are intact  Tongue is midline under ETT  Motor   LUE: 3/5   RUE: extensor posturing   BLE: 1/5 to pain  No abnormal movements  Normal  tone throughout      OBJECTIVE  Vital Signs  Temp:  [97.1 ??F (36.2 ??C)-100 ??F (37.8 ??C)]   Pulse (Heart Rate):  [85-131]   BP: (120-183)/(58-109)   Resp Rate:  [12-22]   O2 Sat (%):  [100 %]   Weight:  [59 kg (130 lb)-64.5 kg (142 lb 3.2 oz)]     MEDICATIONS    Current Facility-Administered Medications:   ???  dextrose 5% lactated ringers infusion, 100 mL/hr, IntraVENous, CONTINUOUS, Reasoner, Robert Z, PA-C, Last Rate: 100 mL/hr at 11/12/19 1013, 100 mL/hr at 11/12/19 1013  ???  meropenem  (MERREM) 1 g in sterile water (preservative free) 20 mL IV syringe, 1 g, IntraVENous, Q12H, Kardar, Ahmed Mertie Moores, MD, 1 g at 11/12/19 [id number]  ???  pantoprazole (PROTONIX) 40 mg in 0.9% sodium chloride 10 mL injection, 40 mg, IntraVENous, DAILY, Kardar, Ahmed Mertie Moores, MD, 40 mg at 11/12/19 [id number]  ???  chlorhexidine (PERIDEX) 0.12 % mouthwash 15 mL, 15 mL, Oral, Q12H, Kardar, Ahmed Mertie Moores, MD, 15 mL at 11/12/19 0853  ???  levoFLOXacin (LEVAQUIN) 500 mg in D5W IVPB, 500 mg, IntraVENous, Q48H, Kardar, Ahmed Mertie Moores, MD, Last Rate: 100 mL/hr at 11/12/19 1758, 500 mg at 11/12/19 1758  ???  albuterol-ipratropium (DUO-NEB) 2.5 MG-0.5 MG/3 ML, 3 mL, Nebulization, Q6H PRN, Mughal, Zahid, MD  ???  propofol (DIPRIVAN) 10 mg/mL infusion, 0-50 mcg/kg/min (Order-Specific), IntraVENous, TITRATE, Pettis, Marsh Dolly, MD, Last Rate: 8.2 mL/hr at 11/12/19 1356, 15 mcg/kg/min at 11/12/19 1356  ???  glucose chewable tablet 16 g, 4 Tab, Oral, PRN, Bishop, Krysten, PA-C  ???  dextrose (D50W) injection syrg 12.5-25 g, 25-50 mL, IntraVENous, PRN, Bishop, Krysten, PA-C, 25 g at 11/11/19 0133  ???  glucagon (GLUCAGEN) injection 1 mg, 1 mg, IntraMUSCular, PRN, Bishop, Krysten, PA-C  ???  insulin lispro (HUMALOG) injection, , SubCUTAneous, AC&HS, Bishop, Krysten, PA-C, 3 Units at 11/12/19 1758  ???  metoprolol (LOPRESSOR) injection 5 mg, 5 mg, IntraVENous, Q6H PRN, Bishop, Krysten, PA-C, 5 mg at 11/12/19 1350  ???  sodium chloride (NS) flush 5-40 mL, 5-40 mL, IntraVENous, Q8H, Bishop, Krysten, PA-C, 10 mL at 11/12/19 1413  ???  sodium chloride (NS) flush 5-40 mL, 5-40 mL, IntraVENous, PRN, Bishop, Krysten, PA-C, 10 mL at 11/11/19 0134  ???  acetaminophen (TYLENOL) tablet 650 mg, 650 mg, Oral, Q6H PRN **OR** acetaminophen (TYLENOL) suppository 650 mg, 650 mg, Rectal, Q6H PRN, Bishop, Krysten, PA-C  ???  polyethylene glycol (MIRALAX) packet 17 g, 17 g, Oral, DAILY PRN, Bishop, Krysten, PA-C  ???  ondansetron (ZOFRAN ODT) tablet 4 mg, 4 mg, Oral, Q8H PRN  **OR** ondansetron (ZOFRAN) injection 4 mg, 4 mg, IntraVENous, Q6H PRN, Bishop, Krysten, PA-C, 4 mg at 11/10/19 2346  ???  heparin (porcine) injection 5,000 Units, 5,000 Units, SubCUTAneous, Q8H, Bishop, Krysten, PA-C, 5,000 Units at 11/12/19 1349      Labs: I've reviewed the labs for today     This document has been prepared by the [employer] voice recognition system, typographical errors may have occurred. Attempts have been made to correct errors, however inadvertent errors may persist.

## 2019-11-13 NOTE — Progress Notes (Signed)
Pulmonology and Critical Care Progress Note    Subjective:     Chief Complaint:   Chief Complaint   Patient presents with   ??? Unresponsive          Patient seen and examined in ICU  Overnight events noted    Remains critically ill  Intubated on mechanical ventilation  Currently on assist control rate of 12, tidal volume 450, FiO2 30%, PEEP of 5  ABGs and chest x-ray reviewed  Remains on propofol  Not responding to stimuli per the nurse when taken off sedation  Not requiring vasopressors at this time    Review of Systems:  Review of systems not obtained due to patient factors.    Current Facility-Administered Medications   Medication Dose Route Frequency Provider Last Rate Last Admin   ??? potassium chloride 10 mEq in 100 ml IVPB  10 mEq IntraVENous Q1H Kardar, Ahmed Marin Olp, MD 100 mL/hr at 11/13/19 0821 10 mEq at 11/13/19 5465   ??? insulin glargine (LANTUS) injection 5 Units  5 Units SubCUTAneous QHS Kardar, Ahmed Marin Olp, MD       ??? insulin lispro (HUMALOG) injection 3 Units  3 Units SubCUTAneous TIDPC Kardar, Ahmed Marin Olp, MD   Stopped at 11/13/19 0900   ??? aspirin delayed-release tablet 81 mg  81 mg Oral DAILY Colan Neptune, MD   81 mg at 11/13/19 6812   ??? atorvastatin (LIPITOR) tablet 40 mg  40 mg Oral DAILY Colan Neptune, MD   40 mg at 11/13/19 7517   ??? dextrose 5% lactated ringers infusion  100 mL/hr IntraVENous CONTINUOUS Diona Browner, PA-C 100 mL/hr at 11/12/19 2239 100 mL/hr at 11/12/19 2239   ??? meropenem (MERREM) 1 g in sterile water (preservative free) 20 mL IV syringe  1 g IntraVENous Q12H Kardar, Salvadore Oxford, MD   1 g at 11/13/19 0017   ??? chlorhexidine (PERIDEX) 0.12 % mouthwash 15 mL  15 mL Oral Q12H Kardar, Ahmed Marin Olp, MD   15 mL at 11/13/19 0821   ??? levoFLOXacin (LEVAQUIN) 500 mg in D5W IVPB  500 mg IntraVENous Q48H Kardar, Ahmed Marin Olp, MD 100 mL/hr at 11/12/19 1758 500 mg at 11/12/19 1758   ??? albuterol-ipratropium (DUO-NEB) 2.5 MG-0.5 MG/3 ML  3 mL Nebulization  Q6H PRN Mughal, Zahid, MD       ??? propofol (DIPRIVAN) 10 mg/mL infusion  0-50 mcg/kg/min (Order-Specific) IntraVENous TITRATE Forde Radon, MD 8.2 mL/hr at 11/13/19 0444 15 mcg/kg/min at 11/13/19 0444   ??? glucose chewable tablet 16 g  4 Tab Oral PRN Bishop, Krysten, PA-C       ??? dextrose (D50W) injection syrg 12.5-25 g  25-50 mL IntraVENous PRN Bishop, Krysten, PA-C   25 g at 11/11/19 0133   ??? glucagon (GLUCAGEN) injection 1 mg  1 mg IntraMUSCular PRN Bishop, Krysten, PA-C       ??? insulin lispro (HUMALOG) injection   SubCUTAneous AC&HS Bishop, Krysten, PA-C   Stopped at 11/13/19 0730   ??? metoprolol (LOPRESSOR) injection 5 mg  5 mg IntraVENous Q6H PRN Bishop, Krysten, PA-C   5 mg at 11/12/19 1350   ??? sodium chloride (NS) flush 5-40 mL  5-40 mL IntraVENous Q8H Bishop, Krysten, PA-C   10 mL at 11/13/19 4944   ??? sodium chloride (NS) flush 5-40 mL  5-40 mL IntraVENous PRN Bishop, Krysten, PA-C   10 mL at 11/11/19 0134   ??? acetaminophen (TYLENOL) tablet 650 mg  650 mg Oral  Q6H PRN Margie Ege, PA-C        Or   ??? acetaminophen (TYLENOL) suppository 650 mg  650 mg Rectal Q6H PRN Margie Ege, PA-C       ??? polyethylene glycol (MIRALAX) packet 17 g  17 g Oral DAILY PRN Bishop, Krysten, PA-C       ??? ondansetron (ZOFRAN ODT) tablet 4 mg  4 mg Oral Q8H PRN Bishop, Krysten, PA-C        Or   ??? ondansetron (ZOFRAN) injection 4 mg  4 mg IntraVENous Q6H PRN Bishop, Krysten, PA-C   4 mg at 11/10/19 2346   ??? heparin (porcine) injection 5,000 Units  5,000 Units SubCUTAneous Q8H Bishop, Krysten, PA-C   5,000 Units at 11/13/19 0634            No Known Allergies        Objective:     Blood pressure (!) 143/92, pulse 97, temperature 97.4 ??F (36.3 ??C), resp. rate 12, height 5' 0.98" (1.549 m), weight 65.6 kg (144 lb 10 oz), SpO2 100 %. Temp (24hrs), Avg:97.7 ??F (36.5 ??C), Min:97.3 ??F (36.3 ??C), Max:98.2 ??F (36.8 ??C)      Intake and Output:  Current Shift: No intake/output data recorded.  Last 3 Shifts: 01/02 1901 - 01/04  0700  In: 1290 [I.V.:1290]  Out: 3175 [Urine:3175]    Physical Exam:    General:  Lying in bed, intubated on mechanical ventilation  Throat and Neck: Supple  Lung:  Patient has good air entry bilaterally.  No wheezing was appreciated.   Heart: S1+S2.  No murmurs, tachycardia  Abdomen: soft, non-tender. Bowel sounds normal. No masses; obese  Extremities: No edema  GU: Not done  Skin: No cyanosis  Neurologic:  Sedated on mechanical ventilation  Psychiatric:  Unable to perform due to patient's condition    Lab/Data Review:  Recent Results (from the past 24 hour(s))   GLUCOSE, POC    Collection Time: 11/12/19 11:14 AM   Result Value Ref Range    Glucose (POC) 188 (H) 65 - 100 mg/dL    Performed by Donney Rankins    GLUCOSE, POC    Collection Time: 11/12/19  1:27 PM   Result Value Ref Range    Glucose (POC) 186 (H) 65 - 100 mg/dL    Performed by Yolande Jolly    GLUCOSE, POC    Collection Time: 11/12/19  4:09 PM   Result Value Ref Range    Glucose (POC) 238 (H) 65 - 100 mg/dL    Performed by Donney Rankins    GLUCOSE, POC    Collection Time: 11/12/19  8:52 PM   Result Value Ref Range    Glucose (POC) 223 (H) 65 - 100 mg/dL    Performed by [facility]    GLUCOSE, POC    Collection Time: 11/13/19 12:50 AM   Result Value Ref Range    Glucose (POC) 342 (H) 65 - 100 mg/dL    Performed by [facility]    BLOOD GAS, ARTERIAL    Collection Time: 11/13/19  3:45 AM   Result Value Ref Range    pH 7.53 (H) 7.35 - 7.45      PCO2 31 (L) 35 - 45 mmHg    PO2 151 (H) 75 - 100 mmHg    O2 SAT 100 >95 %    BICARBONATE 27 (H) 22 - 26 mmol/L    BASE EXCESS 2.6 (H) 0 - 2 mmol/L    O2 METHOD  VENT      FIO2 35.0 %    MODE Assist Control/Volume Control      Tidal volume 450      SET RATE 12      IPAP/PIP 0      EPAP/CPAP/PEEP 5.0      Sample source Arterial      SITE Right Radial      ALLEN'S TEST PASS     GLUCOSE, POC    Collection Time: 11/13/19  3:54 AM   Result Value Ref Range    Glucose (POC) 138 (H) 65 - 100 mg/dL    Performed  by [facility]    CBC W/O DIFF    Collection Time: 11/13/19  4:00 AM   Result Value Ref Range    WBC 6.7 3.6 - 11.0 K/uL    RBC 3.56 (L) 3.80 - 5.20 M/uL    HGB 10.6 (L) 11.5 - 16.0 g/dL    HCT 54.0 (L) 98.1 - 47.0 %    MCV 91.0 80.0 - 99.0 FL    MCH 29.8 26.0 - 34.0 PG    MCHC 32.7 30.0 - 36.5 g/dL    RDW 19.1 47.8 - 29.5 %    PLATELET 249 150 - 400 K/uL    MPV 10.0 8.9 - 12.9 FL   METABOLIC PANEL, BASIC    Collection Time: 11/13/19  4:00 AM   Result Value Ref Range    Sodium 142 136 - 145 mmol/L    Potassium 3.6 3.5 - 5.1 mmol/L    Chloride 110 (H) 97 - 108 mmol/L    CO2 26 21 - 32 mmol/L    Anion gap 6 5 - 15 mmol/L    Glucose 132 (H) 65 - 100 mg/dL    BUN 11 6 - 20 mg/dL    Creatinine 6.21 3.08 - 1.02 mg/dL    BUN/Creatinine ratio 11 (L) 12 - 20      GFR est AA >60 >60 ml/min/1.81m2    GFR est non-AA 57 (L) >60 ml/min/1.58m2    Calcium 8.8 8.5 - 10.1 mg/dL   MAGNESIUM    Collection Time: 11/13/19  4:00 AM   Result Value Ref Range    Magnesium 1.4 (L) 1.6 - 2.4 mg/dL     chest X-ray      XR CHEST PORT   Final Result   Impression: Underexpanded lungs with bibasilar atelectasis.  No significant   interval change.         CTA HEAD   Final Result   IMPRESSION:   1.  No evidence of acute intracranial process on noncontrast CT head.   2.  No large vessel occlusion, hemodynamically significant luminal stenosis or   intracranial aneurysm.      XR CHEST PORT   Final Result   Impression:    No acute cardiopulmonary process.      NM LUNG SCAN PERF   Final Result   Impression:   Normal perfusion scintigraphy.         XR CHEST SNGL V   Final Result   IMPRESSION:   1.  The endotracheal tube tip overlies the tracheal airway approximately 37 mm   above the carina.   2.  The lungs are clear without acute pulmonary parenchymal pathology.        XR CHEST SNGL V   Final Result   IMPRESSION:   1.  The lung parenchyma is clear without acute pulmonary parenchymal pathology.   2.  There  is milder ectasia of the thoracic aorta  related to mild   atherosclerotic vascular changes.        CT HEAD WO CONT   Final Result   IMPRESSION:    1.  This examination is negative for acute intracranial pathology.   2.  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   3.  There is apparent proptosis more pronounced on the right compared to left.                CT SPINE CERV WO CONT   Final Result   Impression:    1.  This examination is negative for an acute fracture or a subluxation injury   to the cervical spine.   2.  There is moderate degenerative disc disease at the C5-C6 level.   3.  There is right-sided facet arthropathy at the C2-C3 and the C3-C4 levels.   There is narrowing of the right neural foramen at C3-C4 level. There is   left-sided facet arthropathy primarily at the C2-C3 C4-C5 levels. There is   narrowing of the left C4-C5 neural foramen.            MRI BRAIN W WO CONT    (Results Pending)   XR CHEST PORT    (Results Pending)   CT HEAD WO CONT    (Results Pending)       CT Results  (Last 48 hours)               11/12/19 1426  CTA HEAD Final result    Impression:  IMPRESSION:   1.  No evidence of acute intracranial process on noncontrast CT head.   2.  No large vessel occlusion, hemodynamically significant luminal stenosis or   intracranial aneurysm.       Narrative:  CTA HEAD WITHOUT AND WITH INTRAVENOUS CONTRAST       INDICATION: Altered mental status, rule out stroke versus metastasis       COMPARISON: Noncontrast CT head from 11/10/2019       TECHNIQUE: Noncontrast head CT. CTA of the head was performed after   administration of 100 mL of Isovue-370 intravenous contrast. Coronal and   sagittal reconstructions performed and evaluated. Dose reduction: Per department   policy, all CT scans at this facility are performed using dose reduction   optimization techniques as appropriate to a performed examination including the   following: Automated exposure control, adjustments of  the mA and/or KV according   to patient size, or use of iterative reconstruction technique.       FINDINGS:   Scout image shows endotracheal and enteric tubes. Fluid in the nasal cavity and   nasopharynx is likely related.       No evidence of acute intracranial hemorrhage or mass effect on noncontrast CT   head. Patchy areas of white matter hypoattenuation are present, nonspecific,   likely from chronic small vessel ischemic changes. Mild to moderate diffuse   parenchymal volume loss with commensurate ventricular enlargement. No   hydrocephalus. Foci of hypodensity within bilateral thalami are likely from   remote ischemic insults. Probable remote lacunar infarction of the posterior   limb of internal capsule. No abnormal extra-axial fluid collections. Mild   mucosal thickening of the paranasal sinuses. Moderate opacification of the left   tympanomastoid cavity. No acute calvarial abnormality.       Mild calcified atherosclerosis of the intracranial internal carotid arteries  without focal hemodynamically significant luminal stenosis. Right ophthalmic   artery is patent. Right anterior cerebral artery is patent. Branches of the   right middle cerebral artery are patent as well.       Mild calcified atherosclerosis of the left cavernous ICA. No hemodynamically   significant luminal stenosis. Left anterior cerebral artery and middle cerebral   artery are widely patent.       Right vertebral artery is dominant. Small intradural left vertebral artery,   which is further reduced in caliber distal to the left by cup. Basilar artery is   widely patent. Bilateral posterior cerebral arteries are patent.                 Assessment:     1.  A 69 year old African-American female with multiple medical problems including history of prior stroke and lung cancer who has been on chemotherapy, last chemotherapy in December 2020 , presented with unresponsiveness.      1.  Acute respiratory failure with hypoxia:  She was intubated  enroute for airway protection.  Currently, she is off propofol.  She is minimally responsive, only opening eyes.  Neurology is consulted.  CT of the head and spine did not show any acute process.    No metastasis were noted.  Chest x-ray does not show any focal infiltrates.  There is some mild prominence to the left hilum.  Apparently, she has a history of lung cancer.     Currently on assist control as outlined above  ABGs and chest x-ray reviewed  Ventilator settings modified, FiO2 decreased to 24%  Further changes in ventilator settings based on clinical response and ABG results    2.  Altered mental status.    As discussed above,   Neurology is consulted.    Neurology noted that patient was posturing on right side.  MRI of the brain and CT scan of head with contrast was ordered by neurology service.    3.  History of lung cancer.    Patient has received last chemotherapy in December 2020, details are not available.    Chest x-ray, however, does not show any significant mass or nodule or other chronic changes.    4.  History of stroke.  5.  Diabetes mellitus.    6.  Acute renal insufficiency.   It has resolved.  Most probably it was volume contraction.   I will continue her some IV fluids and continue to monitor.    7.  For nutrition, she can be started on Glucerna.  We will get a nutritionist input as well.    For deep venous thrombosis prophylaxis, the patient is started on heparin subcutaneously, which will be continued.  For gastrointestinal stress prophylaxis, she is started on Protonix IV which will be continued.    Overall condition remains tenuous  Prognosis guarded  We will check ABG and chest x-ray in a.m.    Questions of patient were answered at bedside in detail  Case discussed in detail with RN, RT, and care team    Thank you for involving me in the care of this patient  I will follow with you closely during hospitalization    Time spent more than 50 minutes in direct patient care with no overlap  reviewing results and records, decision-making, and answering questions.      Thompson GrayerSuhail Henry Utsey, MD  [facility]  11/13/2019

## 2019-11-13 NOTE — Progress Notes (Signed)
Bedside and Verbal shift change report given to E-beth Turner RN (oncoming nurse) by Daine Floras RN (offgoing nurse). Report included the following information SBAR.

## 2019-11-13 NOTE — Progress Notes (Signed)
Reason for Admission:   Unresponsiveness                   RUR Score:          11%           Plan for utilizing home health:      Unknown at this time.    PCP: First and Last name:   Shirline Frees, [place] [phone]   Name of Practice:    Are you a current patient: Yes/No:    Approximate date of last visit:    Can you participate in a virtual visit with your PCP:                     Current Advanced Directive/Advance Care Plan: Patient's POA is her daughter, Kristin Cooper [phone].                         Transition of Care Plan:                    CM spoke with patient's daughter, Kristin Cooper, for DCP assessment. Patient lives with Kristin Cooper in a 2 floor home. Patient is wheelchair bound, also has a bedside commode and a hospital bed was prescribed for the patient on 12/23, however patient ended up In the hospital before the bed was delivered. Prior to this hospitalization, patient was mostly independent with ADLS, daughter assisted when needed.    Patient's discharge plan is pending clinical course at this time. Ultimately, patient's daughter would like her to be able to return home eventually but is open to SNF or rehab if needed.

## 2019-11-14 ENCOUNTER — Inpatient Hospital Stay: Admit: 2019-11-14 | Payer: MEDICARE | Primary: Adult Health

## 2019-11-14 ENCOUNTER — Inpatient Hospital Stay: Payer: MEDICARE | Primary: Adult Health

## 2019-11-14 LAB — BLOOD GAS, ARTERIAL
BASE EXCESS: 2.8 mmol/L — ABNORMAL HIGH (ref 0–2)
BICARBONATE: 27 mmol/L — ABNORMAL HIGH (ref 22–26)
Base Excess: 2.8 mmol/L — ABNORMAL HIGH (ref 0–2)
EPAP/CPAP/PEEP: 5
EPAP/CPAP/PEEP: 5
FIO2: 24 %
FIO2: 24 %
HCO3: 27 mmol/L — ABNORMAL HIGH (ref 22–26)
O2 SAT: 97 % (ref >95)
O2 Sat: 97 % (ref >95)
PCO2: 33 mmHg — ABNORMAL LOW (ref 35–45)
PCO2: 33 mmHg — ABNORMAL LOW (ref 35–45)
PO2: 83 mmHg (ref 75–100)
PO2: 83 mmHg (ref 75–100)
SET RATE: 12
Set Rate: 12
Tidal Volume: 450
Tidal volume: 450
pH: 7.51 — ABNORMAL HIGH (ref 7.35–7.45)
pH: 7.51 — ABNORMAL HIGH (ref 7.35–7.45)

## 2019-11-14 LAB — METABOLIC PANEL, BASIC
Anion gap: 6 mmol/L (ref 5–15)
BUN/Creatinine ratio: 15 (ref 12–20)
BUN: 16 mg/dL (ref 6–20)
CO2: 25 mmol/L (ref 21–32)
Calcium: 8.7 mg/dL (ref 8.5–10.1)
Chloride: 107 mmol/L (ref 97–108)
Creatinine: 1.09 mg/dL — ABNORMAL HIGH (ref 0.55–1.02)
GFR est AA: >60 mL/min/{1.73_m2} (ref >60)
GFR est non-AA: 50 mL/min/{1.73_m2} — ABNORMAL LOW (ref >60)
Glucose: 257 mg/dL — ABNORMAL HIGH (ref 65–100)
Potassium: 4.3 mmol/L (ref 3.5–5.1)
Sodium: 138 mmol/L (ref 136–145)

## 2019-11-14 LAB — GLUCOSE, POC
Glucose (POC): 238 mg/dL — ABNORMAL HIGH (ref 65–100)
Glucose (POC): 473 mg/dL — ABNORMAL HIGH (ref 65–100)
Glucose (POC): 283 mg/dL — ABNORMAL HIGH (ref 65–100)
Glucose (POC): 299 mg/dL — ABNORMAL HIGH (ref 65–100)
Glucose (POC): 300 mg/dL — ABNORMAL HIGH (ref 65–100)

## 2019-11-14 LAB — BASIC METABOLIC PANEL
Anion Gap: 6 mmol/L (ref 5–15)
BUN: 16 mg/dL (ref 6–20)
Bun/Cre Ratio: 15 (ref 12–20)
CO2: 25 mmol/L (ref 21–32)
Calcium: 8.7 mg/dL (ref 8.5–10.1)
Chloride: 107 mmol/L (ref 97–108)
Creatinine: 1.09 mg/dL — ABNORMAL HIGH (ref 0.55–1.02)
EGFR IF NonAfrican American: 50 mL/min/{1.73_m2} — ABNORMAL LOW (ref >60)
GFR African American: >60 mL/min/{1.73_m2} (ref >60)
Glucose: 257 mg/dL — ABNORMAL HIGH (ref 65–100)
Potassium: 4.3 mmol/L (ref 3.5–5.1)
Sodium: 138 mmol/L (ref 136–145)

## 2019-11-14 LAB — POCT GLUCOSE
POC Glucose: 238 mg/dL — ABNORMAL HIGH (ref 65–100)
POC Glucose: 473 mg/dL — ABNORMAL HIGH (ref 65–100)
POC Glucose: 283 mg/dL — ABNORMAL HIGH (ref 65–100)
POC Glucose: 299 mg/dL — ABNORMAL HIGH (ref 65–100)
POC Glucose: 300 mg/dL — ABNORMAL HIGH (ref 65–100)

## 2019-11-14 MED ORDER — DILTIAZEM 125 MG/125 ML (1 MG/ML) IN DEXTROSE 5% IV
125 mg/ mL (1 mg/mL) | INTRAVENOUS | Status: DC
Start: 2019-11-14 — End: 2019-11-14
  Administered 2019-11-14: 20:00:00 via INTRAVENOUS

## 2019-11-14 MED ORDER — GADOBENATE DIMEGLUMINE 529 MG/ML (0.1 MMOL/0.2 ML) IV
529 mg/mL (0.1mmol/0.2mL) | Freq: Once | INTRAVENOUS | Status: AC
Start: 2019-11-14 — End: 2019-11-14
  Administered 2019-11-14: 16:00:00 via INTRAVENOUS

## 2019-11-14 MED FILL — HEPARIN (PORCINE) 5,000 UNIT/ML IJ SOLN: 5000 unit/mL | INTRAMUSCULAR | Qty: 1

## 2019-11-14 MED FILL — HUMALOG U-100 INSULIN 100 UNIT/ML SUBCUTANEOUS SOLUTION: 100 unit/mL | SUBCUTANEOUS | Qty: 3

## 2019-11-14 MED FILL — PROPOFOL 10 MG/ML IV EMUL: 10 mg/mL | INTRAVENOUS | Qty: 50

## 2019-11-14 MED FILL — MULTIHANCE 529 MG/ML (0.1 MMOL/0.2 ML) INTRAVENOUS SOLUTION: 529 mg/mL (0.1mmol/0.2mL) | INTRAVENOUS | Qty: 14

## 2019-11-14 MED FILL — MEROPENEM 1 GRAM IV SOLR: 1 gram | INTRAVENOUS | Qty: 1

## 2019-11-14 MED FILL — ASPIRIN 81 MG TAB, DELAYED RELEASE: 81 mg | ORAL | Qty: 1

## 2019-11-14 MED FILL — HUMALOG U-100 INSULIN 100 UNIT/ML SUBCUTANEOUS SOLUTION: 100 unit/mL | SUBCUTANEOUS | Qty: 5

## 2019-11-14 MED FILL — DILTIAZEM 125 MG/125 ML (1 MG/ML) IN DEXTROSE 5% IV: 125 mg/ mL (1 mg/mL) | INTRAVENOUS | Qty: 1

## 2019-11-14 MED FILL — CHLORHEXIDINE GLUCONATE 0.12 % MOUTHWASH: 0.12 % | Qty: 15

## 2019-11-14 MED FILL — ATORVASTATIN 40 MG TAB: 40 mg | ORAL | Qty: 1

## 2019-11-14 MED FILL — LEVOFLOXACIN IN D5W 500 MG/100 ML IV PIGGY BACK: 500 mg/100 mL | INTRAVENOUS | Qty: 100

## 2019-11-14 MED FILL — HUMALOG U-100 INSULIN 100 UNIT/ML SUBCUTANEOUS SOLUTION: 100 unit/mL | SUBCUTANEOUS | Qty: 7

## 2019-11-14 MED FILL — LANTUS U-100 INSULIN 100 UNIT/ML SUBCUTANEOUS SOLUTION: 100 unit/mL | SUBCUTANEOUS | Qty: 5

## 2019-11-14 NOTE — Progress Notes (Signed)
Bedside and Verbal shift change report given to E-Beth Turner RN (oncoming nurse) by Socorro Marasigan RN (offgoing nurse). Report included the following information SBAR.

## 2019-11-14 NOTE — Progress Notes (Signed)
Pulmonology and Critical Care Progress Note    Subjective:       Patient seen and examined in ICU  Overnight events noted    Remains critically ill  Intubated on mechanical ventilation  Currently on assist control rate of 12, tidal volume 450, FiO2 30%, PEEP of 5  ABGs and chest x-ray reviewed  Had CT scan of head done with contrast yesterday remains on propofol  To keep her Heart rate in range.   Not responding to stimuli per the nurse when taken off sedation  Not requiring vasopressors at this time    Review of Systems:  Review of systems not obtained due to patient factors.    Current Facility-Administered Medications   Medication Dose Route Frequency Provider Last Rate Last Admin   ??? insulin glargine (LANTUS) injection 5 Units  5 Units SubCUTAneous QHS Kardar, Ahmed Marin OlpAbdul Hafeez, MD   5 Units at 11/13/19 2126   ??? insulin lispro (HUMALOG) injection 3 Units  3 Units SubCUTAneous TIDPC Alisia FerrariKardar, Ahmed Abdul Hafeez, MD   3 Units at 11/14/19 [phone]   ??? aspirin delayed-release tablet 81 mg  81 mg Oral DAILY Colan NeptuneAlam, Umar N, MD   81 mg at 11/14/19 [id number]   ??? atorvastatin (LIPITOR) tablet 40 mg  40 mg Oral DAILY Colan NeptuneAlam, Umar N, MD   40 mg at 11/14/19 [id number]   ??? dextrose 5% lactated ringers infusion  100 mL/hr IntraVENous CONTINUOUS Diona BrownerReasoner, Robert Z, PA-C 100 mL/hr at 11/14/19 0220 100 mL/hr at 11/14/19 0220   ??? meropenem (MERREM) 1 g in sterile water (preservative free) 20 mL IV syringe  1 g IntraVENous Q12H Kardar, Salvadore OxfordAhmed Abdul Hafeez, MD   1 g at 11/14/19 [id number]   ??? chlorhexidine (PERIDEX) 0.12 % mouthwash 15 mL  15 mL Oral Q12H Kardar, Ahmed Marin OlpAbdul Hafeez, MD   15 mL at 11/14/19 [id number]   ??? levoFLOXacin (LEVAQUIN) 500 mg in D5W IVPB  500 mg IntraVENous Q48H Kardar, Ahmed Marin OlpAbdul Hafeez, MD 100 mL/hr at 11/12/19 1758 500 mg at 11/12/19 1758   ??? albuterol-ipratropium (DUO-NEB) 2.5 MG-0.5 MG/3 ML  3 mL Nebulization Q6H PRN Mughal, Zahid, MD        ??? propofol (DIPRIVAN) 10 mg/mL infusion  0-50 mcg/kg/min (Order-Specific) IntraVENous TITRATE Forde RadonPettis, Heather M, MD 10.9 mL/hr at 11/14/19 1145 20 mcg/kg/min at 11/14/19 1145   ??? glucose chewable tablet 16 g  4 Tab Oral PRN Margie EgeBishop, Krysten, PA-C       ??? dextrose (D50W) injection syrg 12.5-25 g  25-50 mL IntraVENous PRN Bishop, Krysten, PA-C   25 g at 11/11/19 0133   ??? glucagon (GLUCAGEN) injection 1 mg  1 mg IntraMUSCular PRN Bishop, Krysten, PA-C       ??? insulin lispro (HUMALOG) injection   SubCUTAneous AC&HS Bishop, Tonye PearsonKrysten, PA-C   3 Units at 11/14/19 1145   ??? metoprolol (LOPRESSOR) injection 5 mg  5 mg IntraVENous Q6H PRN Bishop, Krysten, PA-C   5 mg at 11/12/19 1350   ??? sodium chloride (NS) flush 5-40 mL  5-40 mL IntraVENous Q8H Bishop, Krysten, PA-C   10 mL at 11/14/19 0600   ??? sodium chloride (NS) flush 5-40 mL  5-40 mL IntraVENous PRN Bishop, Krysten, PA-C   10 mL at 11/11/19 0134   ??? acetaminophen (TYLENOL) tablet 650 mg  650 mg Oral Q6H PRN Bishop, Krysten, PA-C        Or   ??? acetaminophen (TYLENOL) suppository 650 mg  650 mg Rectal Q6H PRN Margie EgeBishop, Krysten, PA-C       ???  polyethylene glycol (MIRALAX) packet 17 g  17 g Oral DAILY PRN Bishop, Krysten, PA-C       ??? ondansetron (ZOFRAN ODT) tablet 4 mg  4 mg Oral Q8H PRN Bishop, Krysten, PA-C        Or   ??? ondansetron (ZOFRAN) injection 4 mg  4 mg IntraVENous Q6H PRN Bishop, Krysten, PA-C   4 mg at 11/10/19 2346   ??? heparin (porcine) injection 5,000 Units  5,000 Units SubCUTAneous Q8H Bishop, Krysten, PA-C   5,000 Units at 11/14/19 0600            No Known Allergies        Objective:     Blood pressure 118/83, pulse 99, temperature 97.6 ??F (36.4 ??C), resp. rate 12, height 5' 0.98" (1.549 m), weight 65.7 kg (144 lb 13.5 oz), SpO2 100 %. Temp (24hrs), Avg:97.7 ??F (36.5 ??C), Min:97.4 ??F (36.3 ??C), Max:98.2 ??F (36.8 ??C)      Intake and Output:  Current Shift: No intake/output data recorded.  Last 3 Shifts: 01/03 1901 - 01/05 0700  In: 4410.3 [I.V.:3875.3]   Out: 2550 [Urine:2550]    Physical Exam:    General:  Lying in bed, intubated on mechanical ventilation  Throat and Neck: Supple  Lung:  Patient has good air entry bilaterally.  No wheezing was appreciated.   Heart: S1+S2.  No murmurs, tachycardia  Abdomen: soft, non-tender. Bowel sounds normal. No masses; obese  Extremities: No edema  GU: Not done  Skin: No cyanosis   Neurologic:  Sedated on mechanical ventilation  Psychiatric:  Unable to perform due to patient's condition    Lab/Data Review:  Recent Results (from the past 24 hour(s))   GLUCOSE, POC    Collection Time: 11/13/19  1:42 PM   Result Value Ref Range    Glucose (POC) 197 (H) 65 - 100 mg/dL    Performed by [facility] [place]    GLUCOSE, POC    Collection Time: 11/13/19  5:18 PM   Result Value Ref Range    Glucose (POC) 182 (H) 65 - 100 mg/dL    Performed by Drinda ButtsURNER ELIZABETH    GLUCOSE, POC    Collection Time: 11/13/19  9:58 PM   Result Value Ref Range    Glucose (POC) 473 (H) 65 - 100 mg/dL    Performed by Jiles CrockerGRAY ALLISON    GLUCOSE, POC    Collection Time: 11/13/19  9:59 PM   Result Value Ref Range    Glucose (POC) 238 (H) 65 - 100 mg/dL    Performed by Jiles CrockerGRAY ALLISON    BLOOD GAS, ARTERIAL    Collection Time: 11/14/19  5:15 AM   Result Value Ref Range    pH 7.51 (H) 7.35 - 7.45      PCO2 33 (L) 35 - 45 mmHg    PO2 83 75 - 100 mmHg    O2 SAT 97 >95 %    BICARBONATE 27 (H) 22 - 26 mmol/L    BASE EXCESS 2.8 (H) 0 - 2 mmol/L    O2 METHOD VENT      FIO2 24.0 %    MODE Assist Control/Volume Control      Tidal volume 450      SET RATE 12      EPAP/CPAP/PEEP 5.0      SITE Right Radial      ALLEN'S TEST PASS     METABOLIC PANEL, BASIC    Collection Time: 11/14/19  5:15 AM   Result  Value Ref Range    Sodium 138 136 - 145 mmol/L    Potassium 4.3 3.5 - 5.1 mmol/L    Chloride 107 97 - 108 mmol/L    CO2 25 21 - 32 mmol/L    Anion gap 6 5 - 15 mmol/L    Glucose 257 (H) 65 - 100 mg/dL    BUN 16 6 - 20 mg/dL    Creatinine 1.09 (H) 0.55 - 1.02 mg/dL     BUN/Creatinine ratio 15 12 - 20      GFR est AA >60 >60 ml/min/1.62m2    GFR est non-AA 50 (L) >60 ml/min/1.65m2    Calcium 8.7 8.5 - 10.1 mg/dL   GLUCOSE, POC    Collection Time: 11/14/19  5:37 AM   Result Value Ref Range    Glucose (POC) 283 (H) 65 - 100 mg/dL    Performed by [facility] SOCORRO    GLUCOSE, POC    Collection Time: 11/14/19  7:52 AM   Result Value Ref Range    Glucose (POC) 299 (H) 65 - 100 mg/dL    Performed by Kirt Boys      chest X-ray      CT HEAD WO CONT   Final Result   IMPRESSION: Similar findings compared to CT head 11/12/2019.                  XR CHEST PORT   Final Result   Impression: Underexpanded lungs with bibasilar atelectasis.  No significant   interval change.         CTA HEAD   Final Result   IMPRESSION:   1.  No evidence of acute intracranial process on noncontrast CT head.   2.  No large vessel occlusion, hemodynamically significant luminal stenosis or   intracranial aneurysm.      XR CHEST PORT   Final Result   Impression:    No acute cardiopulmonary process.      NM LUNG SCAN PERF   Final Result   Impression:   Normal perfusion scintigraphy.         XR CHEST SNGL V   Final Result   IMPRESSION:   1.  The endotracheal tube tip overlies the tracheal airway approximately 37 mm   above the carina.   2.  The lungs are clear without acute pulmonary parenchymal pathology.        XR CHEST SNGL V   Final Result   IMPRESSION:   1.  The lung parenchyma is clear without acute pulmonary parenchymal pathology.   2.  There is milder ectasia of the thoracic aorta related to mild   atherosclerotic vascular changes.        CT HEAD WO CONT   Final Result   IMPRESSION:    1.  This examination is negative for acute intracranial pathology.   2.  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   3.  There is apparent proptosis more pronounced on the right compared to left.                CT SPINE CERV WO CONT   Final Result    Impression:    1.  This examination is negative for an acute fracture or a subluxation injury   to the cervical spine.   2.  There is moderate degenerative disc disease at the C5-C6 level.   3.  There is right-sided facet arthropathy  at the C2-C3 and the C3-C4 levels.   There is narrowing of the right neural foramen at C3-C4 level. There is   left-sided facet arthropathy primarily at the C2-C3 C4-C5 levels. There is   narrowing of the left C4-C5 neural foramen.            MRI BRAIN W WO CONT    (Results Pending)       CT Results  (Last 48 hours)               11/13/19 1331  CT HEAD WO CONT Final result    Impression:  IMPRESSION: Similar findings compared to CT head 11/12/2019.                       Narrative:  CT head.       Comparison CT head November 12, 2019.       Axial images are reviewed along with reformatted sagittal/coronal images.    Dose reduction: All CT scans at this facility are performed using dose reduction   optimization techniques as appropriate to a performed exam including the   following-   automated exposure control, adjustments of mA and/or Kv according to patient   size, or use of iterative reconstructive technique.       Bone windows demonstrate partial opacification imaged sphenoethmoid air cells.   Some dependent fluid and mucosal wall thickening noted left maxillary sinus.   Some fluid through mastoid air cells. Support tubing in place.        Review of intracranial content reveals diffuse low density through   periventricular/subcortical white matter similar compared to prior imaging.   Although findings are nonspecific, favor gliosis probably on the basis of   nonacute end vessel occlusive change. No hydrocephalus. No intracranial   hemorrhage.           11/12/19 1426  CTA HEAD Final result    Impression:  IMPRESSION:   1.  No evidence of acute intracranial process on noncontrast CT head.   2.  No large vessel occlusion, hemodynamically significant luminal stenosis or    intracranial aneurysm.       Narrative:  CTA HEAD WITHOUT AND WITH INTRAVENOUS CONTRAST       INDICATION: Altered mental status, rule out stroke versus metastasis       COMPARISON: Noncontrast CT head from 11/10/2019       TECHNIQUE: Noncontrast head CT. CTA of the head was performed after   administration of 100 mL of Isovue-370 intravenous contrast. Coronal and   sagittal reconstructions performed and evaluated. Dose reduction: Per department   policy, all CT scans at this facility are performed using dose reduction   optimization techniques as appropriate to a performed examination including the   following: Automated exposure control, adjustments of the mA and/or KV according   to patient size, or use of iterative reconstruction technique.       FINDINGS:   Scout image shows endotracheal and enteric tubes. Fluid in the nasal cavity and   nasopharynx is likely related.       No evidence of acute intracranial hemorrhage or mass effect on noncontrast CT   head. Patchy areas of white matter hypoattenuation are present, nonspecific,   likely from chronic small vessel ischemic changes. Mild to moderate diffuse   parenchymal volume loss with commensurate ventricular enlargement. No   hydrocephalus. Foci of hypodensity within bilateral thalami are likely from   remote ischemic insults. Probable remote lacunar  infarction of the posterior   limb of internal capsule. No abnormal extra-axial fluid collections. Mild   mucosal thickening of the paranasal sinuses. Moderate opacification of the left   tympanomastoid cavity. No acute calvarial abnormality.       Mild calcified atherosclerosis of the intracranial internal carotid arteries   without focal hemodynamically significant luminal stenosis. Right ophthalmic   artery is patent. Right anterior cerebral artery is patent. Branches of the   right middle cerebral artery are patent as well.       Mild calcified atherosclerosis of the left cavernous ICA. No hemodynamically    significant luminal stenosis. Left anterior cerebral artery and middle cerebral   artery are widely patent.       Right vertebral artery is dominant. Small intradural left vertebral artery,   which is further reduced in caliber distal to the left by cup. Basilar artery is   widely patent. Bilateral posterior cerebral arteries are patent.                 Assessment:     1.  A 69 year old African-American female with multiple medical problems including history of prior stroke and lung cancer who has been on chemotherapy, last chemotherapy in December 2020 , presented with unresponsiveness.      1.  Acute respiratory failure with hypoxia:  She was intubated enroute for airway protection.  Currently, she is off propofol.  She is minimally responsive, only opening eyes.  Neurology is consulted.  CT of the head and spine did not show any acute process.    No metastasis were noted.  Chest x-ray does not show any focal infiltrates.  There is some mild prominence to the left hilum.  Apparently, she has a history of lung cancer.     Currently on assist control as outlined above  ABGs and chest x-ray reviewed  Ventilator settings modified, FiO2 decreased to 24%  ABG results were reviewed which showed 1.5 1/33/83/27/97 on 30% FiO2    2.  Altered mental status.    As discussed above,   Neurology is consulted.    Neurology noted that patient was posturing on right side.  CT scan of head with contrast was done which was unremarkable  Patient is scheduled to get MRI of the brain.    Differential diagnosis includes anoxic encephalopathy?  No metastatic lesion was noted on contrast CT scan of head.  3.  History of lung cancer.    Patient has received last chemotherapy in December 2020, details are not available.    Chest x-ray, however, does not show any significant mass or nodule or other chronic changes.    4.  History of stroke.  5.  Diabetes mellitus.    6.  Acute renal insufficiency.    It has resolved.  Most probably it was volume contraction.   I will continue her some IV fluids and continue to monitor.    7.  For nutrition, she can be started on Glucerna.  We will get a nutritionist input as well.    For deep venous thrombosis prophylaxis, the patient is started on heparin subcutaneously, which will be continued.  For gastrointestinal stress prophylaxis, she is started on Protonix IV which will be continued.    Overall condition remains tenuous  Prognosis guarded  We will check ABG and chest x-ray in a.m.    Questions of patient were answered at bedside in detail  Case discussed in detail with RN, RT, and care team  Thank you for involving me in the care of this patient  I will follow with you closely during hospitalization    There is a family meeting that has been arranged with attending physician this afternoon to assess goal of care for the patient.    In the meanwhile I will start her on Cardizem at low-dose IV to control her heart rate and hold her sedation.  Discussed with attending physician    Time spent more than 50 minutes in direct patient care with no overlap reviewing results and records, decision-making, and answering questions.    This document has been prepared by the Dragon voice recognition system, typographical errors may have occurred. Attempts have been made to correct errors, however inadvertent errors may persist.    Thompson Grayer, MD  Pulmonary  [facility] ( [facility])  11/14/2019

## 2019-11-14 NOTE — Progress Notes (Signed)
Physician Progress Note      PATIENT:               Kristin Cooper, Kristin Cooper  CSN #:                  [id number]  DOB:                       05/10/1951  ADMIT DATE:       11/10/2019 9:30 AM  DISCH DATE:  RESPONDING  PROVIDER #:        Imara Standiford A Dayna Geurts MD          QUERY TEXT:    Dear Dr. Jeriah Skufca:    Patient admitted with AMS, hypoglycemia, aspiration PNA.  Patient noted to be unresponsive on ventilator with RUE extensor posturing.  Please document in progress notes and discharge summary if you are treating and/or evaluating any of the following:    The medical record reflects the following:  Risk Factors: 68 F admitted with acute respiratory failure, aspiration PNA, encephalopathy, hypoglycemia  Clinical Indicators: GCS 7 as documented in nursing flowsheets, RUE extensor posturing, not following commands, disconjugate gaze, per Pulmonology note "Not responding to stimuli per the nurse when taken off sedation"  Treatment: intubated on ventilator, Neuro consult, CT scan, labs, IV ABT, neuro checks, Propofol    Thank you,  Lauren Lastovica, BSN, RN  Clinical Documentation Specialist  [phone]  Options provided:  -- Coma due to encephalopathy (PNA, hypoglycemia, lung cancer)  -- Somnolence due to encephalopathy (PNA, hypoglycemia, lung cancer)  -- Other - I will add my own diagnosis  -- Disagree - Not applicable / Not valid  -- Disagree - Clinically unable to determine / Unknown  -- Refer to Clinical Documentation Reviewer    PROVIDER RESPONSE TEXT:    Pt is in a coma due to encephalopathy (PNA, hypoglycemia, lung cancer).    Query created by: Lastovica, Lauren on 11/13/2019 1:57 PM      Electronically signed by:  Enes Wegener A Breda Bond MD 11/14/2019 9:00 AM

## 2019-11-14 NOTE — Progress Notes (Signed)
NEUROLOGY  PROGRESS NOTE    Admission History/Pertinent Events  Kristin Cooper is a 69 y.o. year old female who presented on 11/10/2019. Patient has a past medical history of Lung Cancer, HTN, DM, HL who initially presented after being found unresponsive by family.  Per chart review, patient had several episodes of unresponsiveness on the week prior to presentation.  Per daughter's report.  Patient reportedly has behavioral arrest and then has staring episodes with decreased responsiveness.  On day of presentation, patient had a similar episode but then had vomiting thereafter.  Patient became more responsive than usual and family members reportedly started CPR.  EMS came to find patient had a good pulse but intubated patient and gave her naloxone due to concerns for airway protection.  Patient's glucose is initially was found to be 53. Emergent CT head was negative for any acute findings.  Chest x-ray concerning for possible aspiration pneumonia for which patient was started on IV antibiotics.    Home Stroke Prophylaxis: Atorvastatin 40      ASSESSMENT/PLAN      Impression  Patient's stat CT head without any interval change from prior study.  Patient's EEG revealing moderate to severe generalized encephalopathy without evidence of cortical irritability.  Patient's exam mostly unchanged today.  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  As patient was found down, there is a possibility of a transient coding episode with spontaneous ROSC leading to hypoxic brain injury.  Will follow-up on contrasted MRI for further evaluation of CNS process.  Will continue patient on antiplatelet and statin therapy for stroke prophylaxis for the time being.    Prognosis is quite guarded at the moment.      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  No significant changes on neurological examination.      Physical/Neurological Exam  [name],  Cardiovascular: normal rate and rhythm  Pulmonary: CTAB    Patient intubated  Patient does not follow any central or peripheral commands  Does not attempt to speak  Disconjugate gaze  Pupils react to light bilaterally  Oculocephalics intact  Corneal reflex intact bilaterally  No BTT bilaterally  No facial droop  Cough and gag are intact  Tongue is midline under ETT  Motor   LUE: 3/5   RUE: extensor posturing   BLE: 1/5 to pain  No abnormal movements  Normal tone throughout      OBJECTIVE  Vital Signs  Temp:  [97.1 ??F (36.2 ??C)-100 ??F (37.8 ??C)]   Pulse (Heart Rate):  [85-131]  BP: (100-183)/(58-109)   Resp Rate:  [12-22]   O2 Sat (%):  [100 %]   Weight:  [59 kg (130 lb)-65.6 kg (144 lb 10 oz)]     MEDICATIONS    Current Facility-Administered Medications:   ???  potassium chloride 10 mEq in 100 ml IVPB, 10 mEq, IntraVENous, Q1H, Kardar, Ahmed Marin Olp, MD  ???  insulin glargine (LANTUS) injection 5 Units, 5 Units, SubCUTAneous, QHS, Kardar, Ahmed Marin Olp, MD   ???  insulin lispro (HUMALOG) injection 3 Units, 3 Units, SubCUTAneous, TIDPC, Kardar, Ahmed Marin Olp, MD  ???  aspirin delayed-release tablet 81 mg, 81 mg, Oral, DAILY, Jaquana Geiger N, MD  ???  atorvastatin (LIPITOR) tablet 40 mg, 40 mg, Oral, DAILY, Jilda Kress N, MD  ???  dextrose 5% lactated ringers infusion, 100 mL/hr, IntraVENous, CONTINUOUS, Reasoner, Vladimir Crofts, PA-C, Last Rate: 100 mL/hr at 11/12/19 2239, 100 mL/hr at 11/12/19 2239  ???  meropenem (MERREM) 1 g in sterile water (preservative free) 20 mL IV syringe, 1 g, IntraVENous, Q12H, Kardar, Ahmed Marin Olp, MD, 1 g at 11/12/19 2107  ???  pantoprazole (PROTONIX) 40 mg in 0.9% sodium chloride 10 mL injection, 40 mg, IntraVENous, DAILY, Kardar, Ahmed Marin Olp, MD, 40 mg at 11/12/19 2423  ???  chlorhexidine (PERIDEX) 0.12 % mouthwash 15 mL, 15 mL, Oral, Q12H, Kardar, Ahmed Marin Olp, MD, 15 mL at 11/12/19 2108  ???  levoFLOXacin (LEVAQUIN) 500 mg in D5W IVPB, 500 mg, IntraVENous, Q48H, Kardar, Ahmed Marin Olp, MD, Last Rate: 100 mL/hr at 11/12/19 1758, 500 mg at 11/12/19 1758  ???  albuterol-ipratropium (DUO-NEB) 2.5 MG-0.5 MG/3 ML, 3 mL, Nebulization, Q6H PRN, Mughal, Zahid, MD  ???  propofol (DIPRIVAN) 10 mg/mL infusion, 0-50 mcg/kg/min (Order-Specific), IntraVENous, TITRATE, Pettis, Geroge Baseman, MD, Last Rate: 8.2 mL/hr at 11/13/19 0444, 15 mcg/kg/min at 11/13/19 0444  ???  glucose chewable tablet 16 g, 4 Tab, Oral, PRN, Bishop, Krysten, PA-C  ???  dextrose (D50W) injection syrg 12.5-25 g, 25-50 mL, IntraVENous, PRN, Bishop, Krysten, PA-C, 25 g at 11/11/19 0133  ???  glucagon (GLUCAGEN) injection 1 mg, 1 mg, IntraMUSCular, PRN, Bishop, Krysten, PA-C  ???  insulin lispro (HUMALOG) injection, , SubCUTAneous, AC&HS, Bishop, Krysten, PA-C, 4 Units at 11/13/19 0055  ???  metoprolol (LOPRESSOR) injection 5 mg, 5 mg, IntraVENous, Q6H PRN, Bishop, Krysten, PA-C, 5 mg at 11/12/19 1350   ???  sodium chloride (NS) flush 5-40 mL, 5-40 mL, IntraVENous, Q8H, Bishop, Krysten, PA-C, 10 mL at 11/13/19 5361  ???  sodium chloride (NS) flush 5-40 mL, 5-40 mL, IntraVENous, PRN, Bishop, Krysten, PA-C, 10 mL at 11/11/19 0134  ???  acetaminophen (TYLENOL) tablet 650 mg, 650 mg, Oral, Q6H PRN **OR** acetaminophen (TYLENOL) suppository 650 mg, 650 mg, Rectal, Q6H PRN, Bishop, Krysten, PA-C  ???  polyethylene glycol (MIRALAX) packet 17 g, 17 g, Oral, DAILY PRN, Bishop, Krysten, PA-C  ???  ondansetron (ZOFRAN ODT) tablet 4 mg, 4 mg, Oral, Q8H PRN **OR** ondansetron (ZOFRAN) injection 4 mg, 4 mg, IntraVENous, Q6H PRN, Bishop, Krysten, PA-C, 4 mg at 11/10/19 2346  ???  heparin (porcine) injection 5,000 Units, 5,000 Units, SubCUTAneous, Q8H, Bishop, Krysten, PA-C, 5,000 Units at 11/13/19 4431      Labs: I've reviewed the labs for today     This document has been prepared by the Dragon voice recognition system, typographical errors may have occurred. Attempts have been made to correct errors, however inadvertent errors may persist.

## 2019-11-14 NOTE — Progress Notes (Signed)
Took patient to MRI on MRI compatible vent. Patient tolerated well and had no issues during procedure or transport. When we returned to ICU placed patient back on previous settings on PB

## 2019-11-14 NOTE — Progress Notes (Signed)
Hospitalist Progress Note               Daily Progress Note: 11/14/2019      Subjective:   The patient is seen for follow up.  She is a 69 year old female admitted on 1/1 after being found unresponsive by family.  She has a history of stage IV lung cancer, stroke, and diabetes.  She recently moved from [place], [place] to live with her daughter.  Family apparently started CPR at home although unclear if she ever lost a pulse.  Upon arrival of EMS she had stable vital signs and a pulse.  She was intubated by EMS.  CT of the head showed no acute changes.  Patient has remained on the ventilator, unresponsive.  Echo showed an EF of 71%.  Chest x-ray shows bibasilar atelectasis.  Patient had vomiting prior to intubation and is currently on Merrem for possible aspiration.    Patient is being followed by neurology who is noted no response to sternal rub, no withdrawal to pain and not following commands.    Etiology for her severe encephalopathy is unclear.    MRI is scheduled for today.  Ethics committee was consulted as there has been some disagreement amongst family members.  There are apparently 3 children with both a daughter and son claiming power of attorney, although neither has provided paperwork.  A family meeting has been set for this afternoon    EEG showed no epileptiform activity, just generalized slowing.    Problem List:  Problem List as of 11/14/2019 Never Reviewed          Codes Class Noted - Resolved    AMS (altered mental status) ICD-10-CM: R41.82  ICD-9-CM: 780.97  11/10/2019 - Present              Medications reviewed  Current Facility-Administered Medications   Medication Dose Route Frequency   ??? insulin glargine (LANTUS) injection 5 Units  5 Units SubCUTAneous QHS   ??? insulin lispro (HUMALOG) injection 3 Units  3 Units SubCUTAneous TIDPC   ??? aspirin delayed-release tablet 81 mg  81 mg Oral DAILY   ??? atorvastatin (LIPITOR) tablet 40 mg  40 mg Oral DAILY    ??? dextrose 5% lactated ringers infusion  100 mL/hr IntraVENous CONTINUOUS   ??? meropenem (MERREM) 1 g in sterile water (preservative free) 20 mL IV syringe  1 g IntraVENous Q12H   ??? chlorhexidine (PERIDEX) 0.12 % mouthwash 15 mL  15 mL Oral Q12H   ??? levoFLOXacin (LEVAQUIN) 500 mg in D5W IVPB  500 mg IntraVENous Q48H   ??? albuterol-ipratropium (DUO-NEB) 2.5 MG-0.5 MG/3 ML  3 mL Nebulization Q6H PRN   ??? propofol (DIPRIVAN) 10 mg/mL infusion  0-50 mcg/kg/min (Order-Specific) IntraVENous TITRATE   ??? glucose chewable tablet 16 g  4 Tab Oral PRN   ??? dextrose (D50W) injection syrg 12.5-25 g  25-50 mL IntraVENous PRN   ??? glucagon (GLUCAGEN) injection 1 mg  1 mg IntraMUSCular PRN   ??? insulin lispro (HUMALOG) injection   SubCUTAneous AC&HS   ??? metoprolol (LOPRESSOR) injection 5 mg  5 mg IntraVENous Q6H PRN   ??? sodium chloride (NS) flush 5-40 mL  5-40 mL IntraVENous Q8H   ??? sodium chloride (NS) flush 5-40 mL  5-40 mL IntraVENous PRN   ??? acetaminophen (TYLENOL) tablet 650 mg  650 mg Oral Q6H PRN    Or   ??? acetaminophen (TYLENOL) suppository 650 mg  650 mg Rectal Q6H PRN   ??? polyethylene  glycol (MIRALAX) packet 17 g  17 g Oral DAILY PRN   ??? ondansetron (ZOFRAN ODT) tablet 4 mg  4 mg Oral Q8H PRN    Or   ??? ondansetron (ZOFRAN) injection 4 mg  4 mg IntraVENous Q6H PRN   ??? heparin (porcine) injection 5,000 Units  5,000 Units SubCUTAneous Q8H       Review of Systems:   Review of systems not obtained due to patient factors.    Objective:   Physical Exam:     Visit Vitals  BP 129/70 (BP 1 Location: Left arm, BP Patient Position: At rest;Head of bed elevated (Comment degrees))   Pulse 96   Temp 97.6 ??F (36.4 ??C)   Resp 12   Ht 5' 0.98" (1.549 m)   Wt 65.7 kg (144 lb 13.5 oz)   SpO2 99%   BMI 27.38 kg/m??      O2 Device: Ventilator    Temp (24hrs), Avg:97.6 ??F (36.4 ??C), Min:97.4 ??F (36.3 ??C), Max:98.2 ??F (36.8 ??C)    No intake/output data recorded.   01/03 1901 - 01/05 0700  In: 4410.3 [I.V.:3875.3]  Out: 2550 [Urine:2550]     General:   Intubated, unresponsive   Lungs:   Clear to auscultation bilaterally.   Chest wall:  No tenderness or deformity.   Heart:  Regular rate and rhythm, S1, S2 normal, no murmur, click, rub or gallop.   Abdomen:   Soft, non-tender. Bowel sounds normal. No masses,  No organomegaly.   Extremities: Extremities normal, atraumatic, no cyanosis or edema.   Pulses: 2+ and symmetric all extremities.   Skin: Skin color, texture, turgor normal. No rashes or lesions   Neurologic: CNII-XII intact.  Normal oculocephalic reflex.  No response to painful stimuli.  No withdrawal to pain         Data Review:       Recent Days:  Recent Labs     11/13/19  0400 11/12/19  0430   WBC 6.7 8.6   HGB 10.6* 11.0*   HCT 32.4* 34.1*   PLT 249 242     Recent Labs     11/14/19  0515 11/13/19  0400 11/12/19  0430   NA 138 142 143   144   K 4.3 3.6 3.6   3.5   CL 107 110* 114*   114*   CO2 25 26 24   24    GLU 257* 132* 134*   130*   BUN 16 11 14   14    CREA 1.09* 0.98 0.98   0.98   CA 8.7 8.8 9.4   9.4   MG  --  1.4* 1.5*   ALB  --   --  2.5*   TBILI  --   --  0.5   ALT  --   --  14     Recent Labs     11/14/19  0515 11/13/19  0345 11/12/19  0450   PH 7.51* 7.53* 7.49*   PCO2 33* 31* 32*   PO2 83 151* 105*   HCO3 27* 27* 25   FIO2 24.0 35.0 35.0       24 Hour Results:  Recent Results (from the past 24 hour(s))   GLUCOSE, POC    Collection Time: 11/13/19  9:49 AM   Result Value Ref Range    Glucose (POC) 153 (H) 65 - 100 mg/dL    Performed by 01/10/20    GLUCOSE, POC    Collection Time: 11/13/19  1:42 PM   Result Value Ref Range    Glucose (POC) 197 (H) 65 - 100 mg/dL    Performed by Adams [place]    GLUCOSE, POC    Collection Time: 11/13/19  5:18 PM   Result Value Ref Range    Glucose (POC) 182 (H) 65 - 100 mg/dL    Performed by Drinda Butts    GLUCOSE, POC    Collection Time: 11/13/19  9:58 PM   Result Value Ref Range    Glucose (POC) 473 (H) 65 - 100 mg/dL    Performed by Jiles Crocker    GLUCOSE, POC     Collection Time: 11/13/19  9:59 PM   Result Value Ref Range    Glucose (POC) 238 (H) 65 - 100 mg/dL    Performed by Jiles Crocker    BLOOD GAS, ARTERIAL    Collection Time: 11/14/19  5:15 AM   Result Value Ref Range    pH 7.51 (H) 7.35 - 7.45      PCO2 33 (L) 35 - 45 mmHg    PO2 83 75 - 100 mmHg    O2 SAT 97 >95 %    BICARBONATE 27 (H) 22 - 26 mmol/L    BASE EXCESS 2.8 (H) 0 - 2 mmol/L    O2 METHOD VENT      FIO2 24.0 %    MODE Assist Control/Volume Control      Tidal volume 450      SET RATE 12      EPAP/CPAP/PEEP 5.0      SITE Right Radial      ALLEN'S TEST PASS     METABOLIC PANEL, BASIC    Collection Time: 11/14/19  5:15 AM   Result Value Ref Range    Sodium 138 136 - 145 mmol/L    Potassium 4.3 3.5 - 5.1 mmol/L    Chloride 107 97 - 108 mmol/L    CO2 25 21 - 32 mmol/L    Anion gap 6 5 - 15 mmol/L    Glucose 257 (H) 65 - 100 mg/dL    BUN 16 6 - 20 mg/dL    Creatinine 7.71 (H) 0.55 - 1.02 mg/dL    BUN/Creatinine ratio 15 12 - 20      GFR est AA >60 >60 ml/min/1.98m2    GFR est non-AA 50 (L) >60 ml/min/1.49m2    Calcium 8.7 8.5 - 10.1 mg/dL   GLUCOSE, POC    Collection Time: 11/14/19  5:37 AM   Result Value Ref Range    Glucose (POC) 283 (H) 65 - 100 mg/dL    Performed by [facility]    GLUCOSE, POC    Collection Time: 11/14/19  7:52 AM   Result Value Ref Range    Glucose (POC) 299 (H) 65 - 100 mg/dL    Performed by Jackelyn Knife        CT HEAD WO CONT   Final Result   IMPRESSION: Similar findings compared to CT head 11/12/2019.                  XR CHEST PORT   Final Result   Impression: Underexpanded lungs with bibasilar atelectasis.  No significant   interval change.         CTA HEAD   Final Result   IMPRESSION:   1.  No evidence of acute intracranial process on noncontrast CT head.   2.  No large vessel occlusion, hemodynamically significant luminal stenosis or  intracranial aneurysm.      XR CHEST PORT   Final Result   Impression:    No acute cardiopulmonary process.      NM LUNG SCAN PERF    Final Result   Impression:   Normal perfusion scintigraphy.         XR CHEST SNGL V   Final Result   IMPRESSION:   1.  The endotracheal tube tip overlies the tracheal airway approximately 37 mm   above the carina.   2.  The lungs are clear without acute pulmonary parenchymal pathology.        XR CHEST SNGL V   Final Result   IMPRESSION:   1.  The lung parenchyma is clear without acute pulmonary parenchymal pathology.   2.  There is milder ectasia of the thoracic aorta related to mild   atherosclerotic vascular changes.        CT HEAD WO CONT   Final Result   IMPRESSION:    1.  This examination is negative for acute intracranial pathology.   2.  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   3.  There is apparent proptosis more pronounced on the right compared to left.                CT SPINE CERV WO CONT   Final Result   Impression:    1.  This examination is negative for an acute fracture or a subluxation injury   to the cervical spine.   2.  There is moderate degenerative disc disease at the C5-C6 level.   3.  There is right-sided facet arthropathy at the C2-C3 and the C3-C4 levels.   There is narrowing of the right neural foramen at C3-C4 level. There is   left-sided facet arthropathy primarily at the C2-C3 C4-C5 levels. There is   narrowing of the left C4-C5 neural foramen.            MRI BRAIN W WO CONT    (Results Pending)   XR CHEST PORT    (Results Pending)        Assessment:  Severe encephalopathy, etiology remains unclear.  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    Ludwig Clarks, MD

## 2019-11-14 NOTE — Progress Notes (Signed)
Bedside shift report given to Lonette Campbell-Smith, RN.

## 2019-11-14 NOTE — Procedures (Signed)
[facility] - [facility]  Neurophysiology Lab  Routine Electroencephalogram Report     Kristin Cooper  [id number]  Study Number: [id number]  Referring Provider: Dorrene German, MD  Study Date: 11/12/18  Interpretation Date: 11/14/19       INDICATIONS FOR PROCEDURE    Kristin Cooper is a 69 year old female with past medical history of Lung Cancer, HTN, DM, HL who initially presented after being found down and unresponsive by family.  Routine EEG being done to evaluate for cortical irritability or abnormal brain activity as to the etiology of patient's persistent encephalopathy.        MEDICATIONS      Current Facility-Administered Medications:   ???  insulin glargine (LANTUS) injection 5 Units, 5 Units, SubCUTAneous, QHS, Kardar, Ahmed Marin Olp, MD, 5 Units at 11/13/19 2126  ???  insulin lispro (HUMALOG) injection 3 Units, 3 Units, SubCUTAneous, TIDPC, Kardar, Ahmed Marin Olp, MD, 3 Units at 11/13/19 1721  ???  aspirin delayed-release tablet 81 mg, 81 mg, Oral, DAILY, Colan Neptune, MD, 81 mg at 11/13/19 3419  ???  atorvastatin (LIPITOR) tablet 40 mg, 40 mg, Oral, DAILY, Harold Hedge, Batul Diego N, MD, 40 mg at 11/13/19 6222  ???  dextrose 5% lactated ringers infusion, 100 mL/hr, IntraVENous, CONTINUOUS, Reasoner, Vladimir Crofts, PA-C, Last Rate: 100 mL/hr at 11/14/19 0220, 100 mL/hr at 11/14/19 0220  ???  meropenem (MERREM) 1 g in sterile water (preservative free) 20 mL IV syringe, 1 g, IntraVENous, Q12H, Kardar, Ahmed Marin Olp, MD, 1 g at 11/13/19 2045  ???  chlorhexidine (PERIDEX) 0.12 % mouthwash 15 mL, 15 mL, Oral, Q12H, Kardar, Ahmed Marin Olp, MD, 15 mL at 11/13/19 2044  ???  levoFLOXacin (LEVAQUIN) 500 mg in D5W IVPB, 500 mg, IntraVENous, Q48H, Kardar, Ahmed Marin Olp, MD, Last Rate: 100 mL/hr at 11/12/19 1758, 500 mg at 11/12/19 1758  ???  albuterol-ipratropium (DUO-NEB) 2.5 MG-0.5 MG/3 ML, 3 mL, Nebulization, Q6H PRN, Mughal, Zahid, MD   ???  propofol (DIPRIVAN) 10 mg/mL infusion, 0-50 mcg/kg/min (Order-Specific), IntraVENous, TITRATE, Pettis, Geroge Baseman, MD, Last Rate: 10.9 mL/hr at 11/14/19 0219, 20 mcg/kg/min at 11/14/19 0219  ???  glucose chewable tablet 16 g, 4 Tab, Oral, PRN, Bishop, Krysten, PA-C  ???  dextrose (D50W) injection syrg 12.5-25 g, 25-50 mL, IntraVENous, PRN, Bishop, Krysten, PA-C, 25 g at 11/11/19 0133  ???  glucagon (GLUCAGEN) injection 1 mg, 1 mg, IntraMUSCular, PRN, Bishop, Krysten, PA-C  ???  insulin lispro (HUMALOG) injection, , SubCUTAneous, AC&HS, Bishop, Krysten, PA-C, 3 Units at 11/13/19 2227  ???  metoprolol (LOPRESSOR) injection 5 mg, 5 mg, IntraVENous, Q6H PRN, Bishop, Krysten, PA-C, 5 mg at 11/12/19 1350  ???  sodium chloride (NS) flush 5-40 mL, 5-40 mL, IntraVENous, Q8H, Bishop, Krysten, PA-C, 10 mL at 11/13/19 2129  ???  sodium chloride (NS) flush 5-40 mL, 5-40 mL, IntraVENous, PRN, Bishop, Krysten, PA-C, 10 mL at 11/11/19 0134  ???  acetaminophen (TYLENOL) tablet 650 mg, 650 mg, Oral, Q6H PRN **OR** acetaminophen (TYLENOL) suppository 650 mg, 650 mg, Rectal, Q6H PRN, Bishop, Krysten, PA-C  ???  polyethylene glycol (MIRALAX) packet 17 g, 17 g, Oral, DAILY PRN, Bishop, Krysten, PA-C  ???  ondansetron (ZOFRAN ODT) tablet 4 mg, 4 mg, Oral, Q8H PRN **OR** ondansetron (ZOFRAN) injection 4 mg, 4 mg, IntraVENous, Q6H PRN, Bishop, Krysten, PA-C, 4 mg at 11/10/19 2346  ???  heparin (porcine) injection 5,000 Units, 5,000 Units, SubCUTAneous, Q8H, Bishop, Krysten, PA-C, 5,000 Units at 11/13/19 2125      DESCRIPTION  OF PROCEDURE    Electrodes were applied using paste technique in positions dictated by the International 10-20 system of placement.  Recording montages included both referential and bipolar derivations. In addition to EEG data, EKG and eye movements were recorded. This routine EEG with video began at 318 PM on 11/13/19.  The recording time of the study is 36 minutes and 19 seconds.        States: sedated sleep        DESCRIPTION OF ACTIVITIES    The background is continuous with a frequency of up to 3 to 3.5 Hz and amplitude of 10 to 30 ??V intermixed with medium voltage delta range frequencies.  The background seems to be reactive to physical stimulation.  There is a poor anterior-posterior gradient of the rhythms and thus is not well organized.  There are occasional broad-based 1 to 2 Hz 20 to 40 ??V delta range transients without noted clinical correlation or evolution/organization.    During greater states of drowsiness, there is generalized attenuation and slowing of the background.  Sleep transients are not appreciated.    Photic stimulation did not elicit a driving response or additional abnormalities.    Hyperventilation was not performed.    There are no persistent asymmetries.  There are no definite epileptiform discharges.    Single-channel EKG showed a sinus rhythm with a heart rate between 98 and 107 bpm.        PERTINENT FINDINGS   1) Generalized slowing and disorganization of the background  2) Reactive background         CLINICAL INTERPRETATION    This is an abnormal routine EEG.  The findings indicate a moderate to severe generalized encephalopathy of nonspecific etiology but possibly related to multifocal or generalized dysfunction.  Note, there are no definite epileptiform discharges or seizures captured in the study.

## 2019-11-14 NOTE — Progress Notes (Signed)
Pulmonology and Critical Care Progress Note    Subjective:       Patient seen and examined in ICU  Overnight events noted    Remains critically ill  Intubated on mechanical ventilation  Currently on assist control rate of 12, tidal volume 450, FiO2 30%, PEEP of 5  ABGs and chest x-ray reviewed  Had CT scan of head done with contrast yesterday remains on propofol  To keep her Heart rate in range.   Not responding to stimuli per the nurse when taken off sedation  Not requiring vasopressors at this time    Review of Systems:  Review of systems not obtained due to patient factors.    Current Facility-Administered Medications   Medication Dose Route Frequency Provider Last Rate Last Admin   ??? insulin glargine (LANTUS) injection 5 Units  5 Units SubCUTAneous QHS Kardar, Ahmed Marin Olp, MD   5 Units at 11/13/19 2126   ??? insulin lispro (HUMALOG) injection 3 Units  3 Units SubCUTAneous TIDPC Alisia Ferrari, MD   3 Units at 11/14/19 [phone]   ??? aspirin delayed-release tablet 81 mg  81 mg Oral DAILY Colan Neptune, MD   81 mg at 11/14/19 1093   ??? atorvastatin (LIPITOR) tablet 40 mg  40 mg Oral DAILY Colan Neptune, MD   40 mg at 11/14/19 2355   ??? dextrose 5% lactated ringers infusion  100 mL/hr IntraVENous CONTINUOUS Diona Browner, PA-C 100 mL/hr at 11/14/19 0220 100 mL/hr at 11/14/19 0220   ??? meropenem (MERREM) 1 g in sterile water (preservative free) 20 mL IV syringe  1 g IntraVENous Q12H Kardar, Salvadore Oxford, MD   1 g at 11/14/19 7322   ??? chlorhexidine (PERIDEX) 0.12 % mouthwash 15 mL  15 mL Oral Q12H Kardar, Ahmed Marin Olp, MD   15 mL at 11/14/19 0254   ??? levoFLOXacin (LEVAQUIN) 500 mg in D5W IVPB  500 mg IntraVENous Q48H Kardar, Ahmed Marin Olp, MD 100 mL/hr at 11/12/19 1758 500 mg at 11/12/19 1758   ??? albuterol-ipratropium (DUO-NEB) 2.5 MG-0.5 MG/3 ML  3 mL Nebulization Q6H PRN Mughal, Zahid, MD       ??? propofol (DIPRIVAN) 10 mg/mL infusion  0-50 mcg/kg/min (Order-Specific) IntraVENous TITRATE  Forde Radon, MD 10.9 mL/hr at 11/14/19 1145 20 mcg/kg/min at 11/14/19 1145   ??? glucose chewable tablet 16 g  4 Tab Oral PRN Margie Ege, PA-C       ??? dextrose (D50W) injection syrg 12.5-25 g  25-50 mL IntraVENous PRN Bishop, Krysten, PA-C   25 g at 11/11/19 0133   ??? glucagon (GLUCAGEN) injection 1 mg  1 mg IntraMUSCular PRN Bishop, Krysten, PA-C       ??? insulin lispro (HUMALOG) injection   SubCUTAneous AC&HS Bishop, Tonye Pearson, PA-C   3 Units at 11/14/19 1145   ??? metoprolol (LOPRESSOR) injection 5 mg  5 mg IntraVENous Q6H PRN Bishop, Krysten, PA-C   5 mg at 11/12/19 1350   ??? sodium chloride (NS) flush 5-40 mL  5-40 mL IntraVENous Q8H Bishop, Krysten, PA-C   10 mL at 11/14/19 0600   ??? sodium chloride (NS) flush 5-40 mL  5-40 mL IntraVENous PRN Bishop, Krysten, PA-C   10 mL at 11/11/19 0134   ??? acetaminophen (TYLENOL) tablet 650 mg  650 mg Oral Q6H PRN Bishop, Krysten, PA-C        Or   ??? acetaminophen (TYLENOL) suppository 650 mg  650 mg Rectal Q6H PRN Margie Ege, PA-C       ???  polyethylene glycol (MIRALAX) packet 17 g  17 g Oral DAILY PRN Bishop, Krysten, PA-C       ??? ondansetron (ZOFRAN ODT) tablet 4 mg  4 mg Oral Q8H PRN Bishop, Krysten, PA-C        Or   ??? ondansetron (ZOFRAN) injection 4 mg  4 mg IntraVENous Q6H PRN Bishop, Krysten, PA-C   4 mg at 11/10/19 2346   ??? heparin (porcine) injection 5,000 Units  5,000 Units SubCUTAneous Q8H Bishop, Krysten, PA-C   5,000 Units at 11/14/19 0600            No Known Allergies        Objective:     Blood pressure 118/83, pulse 99, temperature 97.6 ??F (36.4 ??C), resp. rate 12, height 5' 0.98" (1.549 m), weight 65.7 kg (144 lb 13.5 oz), SpO2 100 %. Temp (24hrs), Avg:97.7 ??F (36.5 ??C), Min:97.4 ??F (36.3 ??C), Max:98.2 ??F (36.8 ??C)      Intake and Output:  Current Shift: No intake/output data recorded.  Last 3 Shifts: 01/03 1901 - 01/05 0700  In: 4410.3 [I.V.:3875.3]  Out: 2550 [Urine:2550]    Physical Exam:    General:  Lying in bed, intubated on mechanical  ventilation  Throat and Neck: Supple  Lung:  Patient has good air entry bilaterally.  No wheezing was appreciated.   Heart: S1+S2.  No murmurs, tachycardia  Abdomen: soft, non-tender. Bowel sounds normal. No masses; obese  Extremities: No edema  GU: Not done  Skin: No cyanosis   Neurologic:  Sedated on mechanical ventilation  Psychiatric:  Unable to perform due to patient's condition    Lab/Data Review:  Recent Results (from the past 24 hour(s))   GLUCOSE, POC    Collection Time: 11/13/19  1:42 PM   Result Value Ref Range    Glucose (POC) 197 (H) 65 - 100 mg/dL    Performed by Adams [place]    GLUCOSE, POC    Collection Time: 11/13/19  5:18 PM   Result Value Ref Range    Glucose (POC) 182 (H) 65 - 100 mg/dL    Performed by Drinda Butts    GLUCOSE, POC    Collection Time: 11/13/19  9:58 PM   Result Value Ref Range    Glucose (POC) 473 (H) 65 - 100 mg/dL    Performed by Jiles Crocker    GLUCOSE, POC    Collection Time: 11/13/19  9:59 PM   Result Value Ref Range    Glucose (POC) 238 (H) 65 - 100 mg/dL    Performed by Jiles Crocker    BLOOD GAS, ARTERIAL    Collection Time: 11/14/19  5:15 AM   Result Value Ref Range    pH 7.51 (H) 7.35 - 7.45      PCO2 33 (L) 35 - 45 mmHg    PO2 83 75 - 100 mmHg    O2 SAT 97 >95 %    BICARBONATE 27 (H) 22 - 26 mmol/L    BASE EXCESS 2.8 (H) 0 - 2 mmol/L    O2 METHOD VENT      FIO2 24.0 %    MODE Assist Control/Volume Control      Tidal volume 450      SET RATE 12      EPAP/CPAP/PEEP 5.0      SITE Right Radial      ALLEN'S TEST PASS     METABOLIC PANEL, BASIC    Collection Time: 11/14/19  5:15 AM   Result  Value Ref Range    Sodium 138 136 - 145 mmol/L    Potassium 4.3 3.5 - 5.1 mmol/L    Chloride 107 97 - 108 mmol/L    CO2 25 21 - 32 mmol/L    Anion gap 6 5 - 15 mmol/L    Glucose 257 (H) 65 - 100 mg/dL    BUN 16 6 - 20 mg/dL    Creatinine 1.611.09 (H) 0.55 - 1.02 mg/dL    BUN/Creatinine ratio 15 12 - 20      GFR est AA >60 >60 ml/min/1.4173m2    GFR est non-AA 50 (L) >60 ml/min/1.6073m2     Calcium 8.7 8.5 - 10.1 mg/dL   GLUCOSE, POC    Collection Time: 11/14/19  5:37 AM   Result Value Ref Range    Glucose (POC) 283 (H) 65 - 100 mg/dL    Performed by [facility]    GLUCOSE, POC    Collection Time: 11/14/19  7:52 AM   Result Value Ref Range    Glucose (POC) 299 (H) 65 - 100 mg/dL    Performed by Jackelyn KnifeAragon Lindsay      chest X-ray      CT HEAD WO CONT   Final Result   IMPRESSION: Similar findings compared to CT head 11/12/2019.                  XR CHEST PORT   Final Result   Impression: Underexpanded lungs with bibasilar atelectasis.  No significant   interval change.         CTA HEAD   Final Result   IMPRESSION:   1.  No evidence of acute intracranial process on noncontrast CT head.   2.  No large vessel occlusion, hemodynamically significant luminal stenosis or   intracranial aneurysm.      XR CHEST PORT   Final Result   Impression:    No acute cardiopulmonary process.      NM LUNG SCAN PERF   Final Result   Impression:   Normal perfusion scintigraphy.         XR CHEST SNGL V   Final Result   IMPRESSION:   1.  The endotracheal tube tip overlies the tracheal airway approximately 37 mm   above the carina.   2.  The lungs are clear without acute pulmonary parenchymal pathology.        XR CHEST SNGL V   Final Result   IMPRESSION:   1.  The lung parenchyma is clear without acute pulmonary parenchymal pathology.   2.  There is milder ectasia of the thoracic aorta related to mild   atherosclerotic vascular changes.        CT HEAD WO CONT   Final Result   IMPRESSION:    1.  This examination is negative for acute intracranial pathology.   2.  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   3.  There is apparent proptosis more pronounced on the right compared to left.                CT SPINE CERV WO CONT   Final Result   Impression:    1.  This examination is negative for an acute fracture or a subluxation injury   to the cervical spine.   2.   There is moderate degenerative disc disease at the C5-C6 level.   3.  There is right-sided facet arthropathy  at the C2-C3 and the C3-C4 levels.   There is narrowing of the right neural foramen at C3-C4 level. There is   left-sided facet arthropathy primarily at the C2-C3 C4-C5 levels. There is   narrowing of the left C4-C5 neural foramen.            MRI BRAIN W WO CONT    (Results Pending)       CT Results  (Last 48 hours)               11/13/19 1331  CT HEAD WO CONT Final result    Impression:  IMPRESSION: Similar findings compared to CT head 11/12/2019.                       Narrative:  CT head.       Comparison CT head November 12, 2019.       Axial images are reviewed along with reformatted sagittal/coronal images.    Dose reduction: All CT scans at this facility are performed using dose reduction   optimization techniques as appropriate to a performed exam including the   following-   automated exposure control, adjustments of mA and/or Kv according to patient   size, or use of iterative reconstructive technique.       Bone windows demonstrate partial opacification imaged sphenoethmoid air cells.   Some dependent fluid and mucosal wall thickening noted left maxillary sinus.   Some fluid through mastoid air cells. Support tubing in place.        Review of intracranial content reveals diffuse low density through   periventricular/subcortical white matter similar compared to prior imaging.   Although findings are nonspecific, favor gliosis probably on the basis of   nonacute end vessel occlusive change. No hydrocephalus. No intracranial   hemorrhage.           11/12/19 1426  CTA HEAD Final result    Impression:  IMPRESSION:   1.  No evidence of acute intracranial process on noncontrast CT head.   2.  No large vessel occlusion, hemodynamically significant luminal stenosis or   intracranial aneurysm.       Narrative:  CTA HEAD WITHOUT AND WITH INTRAVENOUS CONTRAST       INDICATION: Altered mental status, rule out stroke  versus metastasis       COMPARISON: Noncontrast CT head from 11/10/2019       TECHNIQUE: Noncontrast head CT. CTA of the head was performed after   administration of 100 mL of Isovue-370 intravenous contrast. Coronal and   sagittal reconstructions performed and evaluated. Dose reduction: Per department   policy, all CT scans at this facility are performed using dose reduction   optimization techniques as appropriate to a performed examination including the   following: Automated exposure control, adjustments of the mA and/or KV according   to patient size, or use of iterative reconstruction technique.       FINDINGS:   Scout image shows endotracheal and enteric tubes. Fluid in the nasal cavity and   nasopharynx is likely related.       No evidence of acute intracranial hemorrhage or mass effect on noncontrast CT   head. Patchy areas of white matter hypoattenuation are present, nonspecific,   likely from chronic small vessel ischemic changes. Mild to moderate diffuse   parenchymal volume loss with commensurate ventricular enlargement. No   hydrocephalus. Foci of hypodensity within bilateral thalami are likely from   remote ischemic insults. Probable remote lacunar  infarction of the posterior   limb of internal capsule. No abnormal extra-axial fluid collections. Mild   mucosal thickening of the paranasal sinuses. Moderate opacification of the left   tympanomastoid cavity. No acute calvarial abnormality.       Mild calcified atherosclerosis of the intracranial internal carotid arteries   without focal hemodynamically significant luminal stenosis. Right ophthalmic   artery is patent. Right anterior cerebral artery is patent. Branches of the   right middle cerebral artery are patent as well.       Mild calcified atherosclerosis of the left cavernous ICA. No hemodynamically   significant luminal stenosis. Left anterior cerebral artery and middle cerebral   artery are widely patent.       Right vertebral artery is dominant.  Small intradural left vertebral artery,   which is further reduced in caliber distal to the left by cup. Basilar artery is   widely patent. Bilateral posterior cerebral arteries are patent.                 Assessment:     1.  A 69 year old African-American female with multiple medical problems including history of prior stroke and lung cancer who has been on chemotherapy, last chemotherapy in December 2020 , presented with unresponsiveness.      1.  Acute respiratory failure with hypoxia:  She was intubated enroute for airway protection.  Currently, she is off propofol.  She is minimally responsive, only opening eyes.  Neurology is consulted.  CT of the head and spine did not show any acute process.    No metastasis were noted.  Chest x-ray does not show any focal infiltrates.  There is some mild prominence to the left hilum.  Apparently, she has a history of lung cancer.     Currently on assist control as outlined above  ABGs and chest x-ray reviewed  Ventilator settings modified, FiO2 decreased to 24%  ABG results were reviewed which showed 1.5 1/33/83/27/97 on 30% FiO2    2.  Altered mental status.    As discussed above,   Neurology is consulted.    Neurology noted that patient was posturing on right side.  CT scan of head with contrast was done which was unremarkable  Patient is scheduled to get MRI of the brain.    Differential diagnosis includes anoxic encephalopathy?  No metastatic lesion was noted on contrast CT scan of head.  3.  History of lung cancer.    Patient has received last chemotherapy in December 2020, details are not available.    Chest x-ray, however, does not show any significant mass or nodule or other chronic changes.    4.  History of stroke.  5.  Diabetes mellitus.    6.  Acute renal insufficiency.   It has resolved.  Most probably it was volume contraction.   I will continue her some IV fluids and continue to monitor.    7.  For nutrition, she can be started on Glucerna.  We will get a  nutritionist input as well.    For deep venous thrombosis prophylaxis, the patient is started on heparin subcutaneously, which will be continued.  For gastrointestinal stress prophylaxis, she is started on Protonix IV which will be continued.    Overall condition remains tenuous  Prognosis guarded  We will check ABG and chest x-ray in a.m.    Questions of patient were answered at bedside in detail  Case discussed in detail with RN, RT, and care team  Thank you for involving me in the care of this patient  I will follow with you closely during hospitalization    There is a family meeting that has been arranged with attending physician this afternoon to assess goal of care for the patient.    In the meanwhile I will start her on Cardizem at low-dose IV to control her heart rate and hold her sedation.  Discussed with attending physician    Time spent more than 50 minutes in direct patient care with no overlap reviewing results and records, decision-making, and answering questions.    This document has been prepared by the Dragon voice recognition system, typographical errors may have occurred. Attempts have been made to correct errors, however inadvertent errors may persist.    Thompson Grayer, MD  Pulmonary  [facility] ( [facility])  11/14/2019

## 2019-11-14 NOTE — Progress Notes (Signed)
Took patient to MRI on MRI compatible vent. Patient tolerated well and had no issues during procedure or transport. When we returned to ICU placed patient back on previous settings on PB

## 2019-11-14 NOTE — Progress Notes (Signed)
Bedside and Verbal shift change report given to E-Beth Turner RN (oncoming nurse) by Morrell Riddle RN (offgoing nurse). Report included the following information SBAR.

## 2019-11-14 NOTE — Progress Notes (Signed)
Progress Notes by Lieutenant DiegoFrelier, Amerigo Mcglory R, MD at 11/14/19 [phone]                Author: Lieutenant DiegoFrelier, Slayter Moorhouse R, MD  Service: Internal Medicine  Author Type: Physician       Filed: 11/14/19 0826  Date of Service: 11/14/19 0802  Status: Signed          Editor: Lieutenant DiegoFrelier, Hope Holst R, MD (Physician)                              Hospitalist Progress Note                   Daily Progress Note: 11/14/2019           Subjective:     The patient is seen for follow up.  She is a 69 year old female admitted on 1/1 after being found unresponsive by family.  She has a history of stage IV lung cancer, stroke, and diabetes.   [place].  Family apparently started CPR at home although unclear if she ever lost a pulse.  Upon arrival of EMS she had stable vital signs and a pulse.  She was intubated by EMS.  CT  of the head showed no acute changes.  Patient has remained on the ventilator, unresponsive.  Echo showed an EF of 71%.  Chest x-ray shows bibasilar atelectasis.  Patient had vomiting prior to intubation and is currently on Merrem for possible aspiration.      Patient is being followed by neurology who is noted no response to sternal rub, no withdrawal to pain and not following commands.      Etiology for her severe encephalopathy is unclear.      MRI is scheduled for today.  Ethics committee was consulted as there has been some disagreement amongst family members.  There are apparently 3 children with both a daughter and son claiming power of attorney, although neither has provided paperwork.   A family meeting has been set for this afternoon      EEG showed no epileptiform activity, just generalized slowing.      Problem List:      Problem List  as of 11/14/2019  Never Reviewed                        Codes  Class  Noted - Resolved             AMS (altered mental status)  ICD-10-CM: R41.82   ICD-9-CM: 780.97    11/10/2019 - Present                          Medications  reviewed     Current Facility-Administered Medications          Medication  Dose  Route  Frequency           ?  insulin glargine (LANTUS) injection 5 Units   5 Units  SubCUTAneous  QHS     ?  insulin lispro (HUMALOG) injection 3 Units   3 Units  SubCUTAneous  TIDPC     ?  aspirin delayed-release tablet 81 mg   81 mg  Oral  DAILY     ?  atorvastatin (LIPITOR) tablet 40 mg   40 mg  Oral  DAILY     ?  dextrose 5%  lactated ringers infusion   100 mL/hr  IntraVENous  CONTINUOUS     ?  meropenem (MERREM) 1 g in sterile water (preservative free) 20 mL IV syringe   1 g  IntraVENous  Q12H     ?  chlorhexidine (PERIDEX) 0.12 % mouthwash 15 mL   15 mL  Oral  Q12H     ?  levoFLOXacin (LEVAQUIN) 500 mg in D5W IVPB   500 mg  IntraVENous  Q48H     ?  albuterol-ipratropium (DUO-NEB) 2.5 MG-0.5 MG/3 ML   3 mL  Nebulization  Q6H PRN     ?  propofol (DIPRIVAN) 10 mg/mL infusion   0-50 mcg/kg/min (Order-Specific)  IntraVENous  TITRATE     ?  glucose chewable tablet 16 g   4 Tab  Oral  PRN     ?  dextrose (D50W) injection syrg 12.5-25 g   25-50 mL  IntraVENous  PRN     ?  glucagon (GLUCAGEN) injection 1 mg   1 mg  IntraMUSCular  PRN     ?  insulin lispro (HUMALOG) injection     SubCUTAneous  AC&HS     ?  metoprolol (LOPRESSOR) injection 5 mg   5 mg  IntraVENous  Q6H PRN     ?  sodium chloride (NS) flush 5-40 mL   5-40 mL  IntraVENous  Q8H     ?  sodium chloride (NS) flush 5-40 mL   5-40 mL  IntraVENous  PRN     ?  acetaminophen (TYLENOL) tablet 650 mg   650 mg  Oral  Q6H PRN          Or           ?  acetaminophen (TYLENOL) suppository 650 mg   650 mg  Rectal  Q6H PRN     ?  polyethylene glycol (MIRALAX) packet 17 g   17 g  Oral  DAILY PRN     ?  ondansetron (ZOFRAN ODT) tablet 4 mg   4 mg  Oral  Q8H PRN          Or           ?  ondansetron (ZOFRAN) injection 4 mg   4 mg  IntraVENous  Q6H PRN           ?  heparin (porcine) injection 5,000 Units   5,000 Units  SubCUTAneous  Q8H             Review of Systems:     Review of systems not  obtained due to patient factors.        Objective:     Physical Exam:       Visit Vitals      BP  129/70 (BP 1 Location: Left arm, BP Patient Position: At rest;Head of bed elevated (Comment degrees))     Pulse  96     Temp  97.6 ??F (36.4 ??C)     Resp  12     Ht  5' 0.98" (1.549 m)     Wt  65.7 kg (144 lb 13.5 oz)     SpO2  99%        BMI  27.38 kg/m??        O2 Device:  Ventilator      Temp (24hrs), Avg:97.6 ??F (36.4 ??C), Min:97.4 ??F (36.3 ??C), Max:98.2 ??F (36.8 ??C)     No intake/output data recorded.    01/03 1901 - 01/05 0700  In: 4410.3 [I.V.:3875.3]   Out: 2550 [Urine:2550]         General:    Intubated, unresponsive        Lungs:    Clear to auscultation bilaterally.        Chest wall:   No tenderness or deformity.        Heart:   Regular rate and rhythm, S1, S2 normal, no murmur, click, rub or gallop.        Abdomen:    Soft, non-tender. Bowel sounds normal. No masses,  No organomegaly.        Extremities:  Extremities normal, atraumatic, no cyanosis or edema.     Pulses:  2+ and symmetric all extremities.     Skin:  Skin color, texture, turgor normal. No rashes or lesions        Neurologic:  CNII-XII intact.  Normal oculocephalic reflex.  No response to painful stimuli.  No withdrawal to pain                Data Review:          Recent Days:     Recent Labs            11/13/19   0400  11/12/19   0430     WBC  6.7  8.6     HGB  10.6*  11.0*     HCT  32.4*  34.1*         PLT  249  242          Recent Labs             11/14/19   0515  11/13/19   0400  11/12/19   0430     NA  138  142  143   144     K  4.3  3.6  3.6   3.5     CL  107  110*  114*   114*     CO2  25  26  24   24      GLU  257*  132*  134*   130*     BUN  16  11  14   14      CREA  1.09*  0.98  0.98   0.98     CA  8.7  8.8  9.4   9.4     MG   --   1.4*  1.5*     ALB   --    --   2.5*     TBILI   --    --   0.5          ALT   --    --   14          Recent Labs             11/14/19   0515  11/13/19   0345  11/12/19   0450     PH  7.51*  7.53*  7.49*      PCO2  33*  31*  32*     PO2  83  151*  105*     HCO3  27*  27*  25          FIO2  24.0  35.0  35.0           24 Hour Results:     Recent Results (from the past 24 hour(s))     GLUCOSE, POC  Collection Time: 11/13/19  9:49 AM         Result  Value  Ref Range            Glucose (POC)  153 (H)  65 - 100 mg/dL       Performed by  Hoy Morn         GLUCOSE, POC          Collection Time: 11/13/19  1:42 PM         Result  Value  Ref Range            Glucose (POC)  197 (H)  65 - 100 mg/dL       Performed by  [facility]         GLUCOSE, POC          Collection Time: 11/13/19  5:18 PM         Result  Value  Ref Range            Glucose (POC)  182 (H)  65 - 100 mg/dL       Performed by  Drinda Butts         GLUCOSE, POC          Collection Time: 11/13/19  9:58 PM         Result  Value  Ref Range            Glucose (POC)  473 (H)  65 - 100 mg/dL       Performed by  Jiles Crocker         GLUCOSE, POC          Collection Time: 11/13/19  9:59 PM         Result  Value  Ref Range            Glucose (POC)  238 (H)  65 - 100 mg/dL       Performed by  Jiles Crocker         BLOOD GAS, ARTERIAL          Collection Time: 11/14/19  5:15 AM         Result  Value  Ref Range            pH  7.51 (H)  7.35 - 7.45         PCO2  33 (L)  35 - 45 mmHg       PO2  83  75 - 100 mmHg       O2 SAT  97  >95 %       BICARBONATE  27 (H)  22 - 26 mmol/L       BASE EXCESS  2.8 (H)  0 - 2 mmol/L       O2 METHOD  VENT          FIO2  24.0  %       MODE  Assist Control/Volume Control          Tidal volume  450          SET RATE  12          EPAP/CPAP/PEEP  5.0          SITE  Right Radial          ALLEN'S TEST  PASS          METABOLIC PANEL, BASIC          Collection Time: 11/14/19  5:15 AM  Result  Value  Ref Range            Sodium  138  136 - 145 mmol/L       Potassium  4.3  3.5 - 5.1 mmol/L       Chloride  107  97 - 108 mmol/L       CO2  25  21 - 32 mmol/L       Anion gap  6  5 - 15 mmol/L       Glucose  257 (H)  65 - 100 mg/dL        BUN  16  6 - 20 mg/dL       Creatinine  1.09 (H)  0.55 - 1.02 mg/dL       BUN/Creatinine ratio  15  12 - 20         GFR est AA  >60  >60 ml/min/1.9m2       GFR est non-AA  50 (L)  >60 ml/min/1.69m2       Calcium  8.7  8.5 - 10.1 mg/dL       GLUCOSE, POC          Collection Time: 11/14/19  5:37 AM         Result  Value  Ref Range            Glucose (POC)  283 (H)  65 - 100 mg/dL       Performed by  [facility]         GLUCOSE, POC          Collection Time: 11/14/19  7:52 AM         Result  Value  Ref Range            Glucose (POC)  299 (H)  65 - 100 mg/dL            Performed by  Kirt Boys               CT HEAD WO CONT       Final Result     IMPRESSION: Similar findings compared to CT head 11/12/2019.                                XR CHEST PORT       Final Result     Impression: Underexpanded lungs with bibasilar atelectasis.  No significant     interval change.                 CTA HEAD       Final Result     IMPRESSION:     1.  No evidence of acute intracranial process on noncontrast CT head.     2.  No large vessel occlusion, hemodynamically significant luminal stenosis or     intracranial aneurysm.            XR CHEST PORT       Final Result     Impression:      No acute cardiopulmonary process.            NM LUNG SCAN PERF       Final Result     Impression:     Normal perfusion scintigraphy.                 XR CHEST SNGL V       Final Result  IMPRESSION:     1.  The endotracheal tube tip overlies the tracheal airway approximately 37 mm     above the carina.     2.  The lungs are clear without acute pulmonary parenchymal pathology.              XR CHEST SNGL V       Final Result     IMPRESSION:     1.  The lung parenchyma is clear without acute pulmonary parenchymal pathology.     2.  There is milder ectasia of the thoracic aorta related to mild     atherosclerotic vascular changes.              CT HEAD WO CONT       Final Result     IMPRESSION:      1.  This examination is negative for acute  intracranial pathology.     2.  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     3.  There is apparent proptosis more pronounced on the right compared to left.                            CT SPINE CERV WO CONT       Final Result     Impression:      1.  This examination is negative for an acute fracture or a subluxation injury     to the cervical spine.     2.  There is moderate degenerative disc disease at the C5-C6 level.     3.  There is right-sided facet arthropathy at the C2-C3 and the C3-C4 levels.     There is narrowing of the right neural foramen at C3-C4 level. There is     left-sided facet arthropathy primarily at the C2-C3 C4-C5 levels. There is     narrowing of the left C4-C5 neural foramen.                      MRI BRAIN W WO CONT    (Results Pending)       XR CHEST PORT    (Results Pending)            Assessment:   Severe encephalopathy, etiology remains unclear.  1 distinct possibility is that patient had a cardiac arrest at home with spontaneous ROSC in the field, now with resultant anoxic encephalopathy patient  remains unresponsive      Acute respiratory failure with hypoxia, requiring mechanical ventilation      Aspiration pneumonia      History of lung cancer, details unknown      Diabetes mellitus type 2      History of stroke         Plan:   Continue mechanical ventilation and supportive care   Continue IV Merrem and levofloxacin   Prognosis appears grim   Family meeting scheduled for later today         Care Plan discussed with: Nurse      Total time spent with patient: 30 minutes.      Lieutenant DiegoSteven R Gregory Dowe, MD

## 2019-11-14 NOTE — Progress Notes (Signed)
Bedside shift report given to Polo Riley, RN.

## 2019-11-14 NOTE — Procedures (Signed)
[facility]  Neurophysiology Lab  Routine Electroencephalogram Report     Kristin Cooper  HO:[id number]  Study Number: [id number]  Referring Provider: Curlene Labrum, MD  Study Date: 11/12/18  Interpretation Date: 11/14/19       INDICATIONS FOR PROCEDURE    Kristin Cooper is a 69 year old female with past medical history of Lung Cancer, HTN, DM, HL who initially presented after being found down and unresponsive by family.  Routine EEG being done to evaluate for cortical irritability or abnormal brain activity as to the etiology of patient's persistent encephalopathy.        MEDICATIONS      Current Facility-Administered Medications:   ???  insulin glargine (LANTUS) injection 5 Units, 5 Units, SubCUTAneous, QHS, Kardar, Ahmed Mertie Moores, MD, 5 Units at 11/13/19 2126  ???  insulin lispro (HUMALOG) injection 3 Units, 3 Units, SubCUTAneous, TIDPC, Kardar, Ahmed Mertie Moores, MD, 3 Units at 11/13/19 1721  ???  aspirin delayed-release tablet 81 mg, 81 mg, Oral, DAILY, Linward Natal, MD, 81 mg at 11/13/19 [id number]  ???  atorvastatin (LIPITOR) tablet 40 mg, 40 mg, Oral, DAILY, Izetta Dakin, Jeweline Reif N, MD, 40 mg at 11/13/19 [id number]  ???  dextrose 5% lactated ringers infusion, 100 mL/hr, IntraVENous, CONTINUOUS, Reasoner, Gasper Sells, PA-C, Last Rate: 100 mL/hr at 11/14/19 0220, 100 mL/hr at 11/14/19 0220  ???  meropenem (MERREM) 1 g in sterile water (preservative free) 20 mL IV syringe, 1 g, IntraVENous, Q12H, Kardar, Ahmed Mertie Moores, MD, 1 g at 11/13/19 2045  ???  chlorhexidine (PERIDEX) 0.12 % mouthwash 15 mL, 15 mL, Oral, Q12H, Kardar, Ahmed Mertie Moores, MD, 15 mL at 11/13/19 2044  ???  levoFLOXacin (LEVAQUIN) 500 mg in D5W IVPB, 500 mg, IntraVENous, Q48H, Kardar, Ahmed Mertie Moores, MD, Last Rate: 100 mL/hr at 11/12/19 1758, 500 mg at 11/12/19 1758  ???  albuterol-ipratropium (DUO-NEB) 2.5 MG-0.5 MG/3 ML, 3 mL, Nebulization, Q6H PRN, Mughal, Zahid, MD  ???  propofol (DIPRIVAN) 10 mg/mL infusion, 0-50 mcg/kg/min (Order-Specific), IntraVENous,  TITRATE, Pettis, Marsh Dolly, MD, Last Rate: 10.9 mL/hr at 11/14/19 0219, 20 mcg/kg/min at 11/14/19 0219  ???  glucose chewable tablet 16 g, 4 Tab, Oral, PRN, Bishop, Krysten, PA-C  ???  dextrose (D50W) injection syrg 12.5-25 g, 25-50 mL, IntraVENous, PRN, Bishop, Krysten, PA-C, 25 g at 11/11/19 0133  ???  glucagon (GLUCAGEN) injection 1 mg, 1 mg, IntraMUSCular, PRN, Bishop, Krysten, PA-C  ???  insulin lispro (HUMALOG) injection, , SubCUTAneous, AC&HS, Bishop, Krysten, PA-C, 3 Units at 11/13/19 2227  ???  metoprolol (LOPRESSOR) injection 5 mg, 5 mg, IntraVENous, Q6H PRN, Bishop, Krysten, PA-C, 5 mg at 11/12/19 1350  ???  sodium chloride (NS) flush 5-40 mL, 5-40 mL, IntraVENous, Q8H, Bishop, Krysten, PA-C, 10 mL at 11/13/19 2129  ???  sodium chloride (NS) flush 5-40 mL, 5-40 mL, IntraVENous, PRN, Bishop, Krysten, PA-C, 10 mL at 11/11/19 0134  ???  acetaminophen (TYLENOL) tablet 650 mg, 650 mg, Oral, Q6H PRN **OR** acetaminophen (TYLENOL) suppository 650 mg, 650 mg, Rectal, Q6H PRN, Bishop, Krysten, PA-C  ???  polyethylene glycol (MIRALAX) packet 17 g, 17 g, Oral, DAILY PRN, Bishop, Krysten, PA-C  ???  ondansetron (ZOFRAN ODT) tablet 4 mg, 4 mg, Oral, Q8H PRN **OR** ondansetron (ZOFRAN) injection 4 mg, 4 mg, IntraVENous, Q6H PRN, Bishop, Krysten, PA-C, 4 mg at 11/10/19 2346  ???  heparin (porcine) injection 5,000 Units, 5,000 Units, SubCUTAneous, Q8H, Bishop, Krysten, PA-C, 5,000 Units at 11/13/19 2125      DESCRIPTION  OF PROCEDURE    Electrodes were applied using paste technique in positions dictated by the International 10-20 system of placement.  Recording montages included both referential and bipolar derivations. In addition to EEG data, EKG and eye movements were recorded. This routine EEG with video began at 318 PM on 11/13/19.  The recording time of the study is 36 minutes and 19 seconds.        States: sedated sleep       DESCRIPTION OF ACTIVITIES    The background is continuous with a frequency of up to 3 to 3.5 Hz and amplitude of  10 to 30 ??V intermixed with medium voltage delta range frequencies.  The background seems to be reactive to physical stimulation.  There is a poor anterior-posterior gradient of the rhythms and thus is not well organized.  There are occasional broad-based 1 to 2 Hz 20 to 40 ??V delta range transients without noted clinical correlation or evolution/organization.    During greater states of drowsiness, there is generalized attenuation and slowing of the background.  Sleep transients are not appreciated.    Photic stimulation did not elicit a driving response or additional abnormalities.    Hyperventilation was not performed.    There are no persistent asymmetries.  There are no definite epileptiform discharges.    Single-channel EKG showed a sinus rhythm with a heart rate between 98 and 107 bpm.        PERTINENT FINDINGS   1) Generalized slowing and disorganization of the background  2) Reactive background         CLINICAL INTERPRETATION    This is an abnormal routine EEG.  The findings indicate a moderate to severe generalized encephalopathy of nonspecific etiology but possibly related to multifocal or generalized dysfunction.  Note, there are no definite epileptiform discharges or seizures captured in the study.

## 2019-11-14 NOTE — Progress Notes (Signed)
Physician Progress Note      PATIENTSUBRENA, Kristin Cooper  CSN #:                  [id number]  DOB:                       [birth year]  ADMIT DATE:       11/10/2019 9:30 AM  DISCH DATE:  RESPONDING  PROVIDER #:        Fleet Contras MD          QUERY TEXT:    Dear Dr. Michaelle Copas:    Patient admitted with AMS, hypoglycemia, aspiration PNA.  Patient noted to be unresponsive on ventilator with RUE extensor posturing.  Please document in progress notes and discharge summary if you are treating and/or evaluating any of the following:    The medical record reflects the following:  Risk Factors: 50 F admitted with acute respiratory failure, aspiration PNA, encephalopathy, hypoglycemia  Clinical Indicators: GCS 7 as documented in nursing flowsheets, RUE extensor posturing, not following commands, disconjugate gaze, per Pulmonology note "Not responding to stimuli per the nurse when taken off sedation"  Treatment: intubated on ventilator, Neuro consult, CT scan, labs, IV ABT, neuro checks, Propofol    Thank you,  Lindaann Slough, BSN, RN  Clinical Documentation Specialist  [phone]  Options provided:  -- Coma due to encephalopathy (PNA, hypoglycemia, lung cancer)  -- Somnolence due to encephalopathy (PNA, hypoglycemia, lung cancer)  -- Other - I will add my own diagnosis  -- Disagree - Not applicable / Not valid  -- Disagree - Clinically unable to determine / Unknown  -- Refer to Clinical Documentation Reviewer    PROVIDER RESPONSE TEXT:    Pt is in a coma due to encephalopathy (PNA, hypoglycemia, lung cancer).    Query created by: Lindaann Slough on 11/13/2019 1:57 PM      Electronically signed by:  Fleet Contras MD 11/14/2019 9:00 AM

## 2019-11-14 NOTE — Progress Notes (Signed)
NEUROLOGY  PROGRESS NOTE    Admission History/Pertinent Events  Kristin Cooper is a 69 y.o. year old female who presented on 11/10/2019. Patient has a past medical history of Lung Cancer, HTN, DM, HL who initially presented after being found unresponsive by family.  Per chart review, patient had several episodes of unresponsiveness on the week prior to presentation.  Per daughter's report.  Patient reportedly has behavioral arrest and then has staring episodes with decreased responsiveness.  On day of presentation, patient had a similar episode but then had vomiting thereafter.  Patient became more responsive than usual and family members reportedly started CPR.  EMS came to find patient had a good pulse but intubated patient and gave her naloxone due to concerns for airway protection.  Patient's glucose is initially was found to be 53. Emergent CT head was negative for any acute findings.  Chest x-ray concerning for possible aspiration pneumonia for which patient was started on IV antibiotics.    Home Stroke Prophylaxis: Atorvastatin 40      ASSESSMENT/PLAN      Impression  Patient's stat CT head without any interval change from prior study.  Patient's EEG revealing moderate to severe generalized encephalopathy without evidence of cortical irritability.  Patient's exam mostly unchanged today.  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  As patient was found down, there is a possibility of a transient coding episode with spontaneous ROSC leading to hypoxic brain injury.  Will follow-up on contrasted MRI for further evaluation of CNS process.  Will continue patient on antiplatelet and statin therapy for stroke prophylaxis for the time being.    Prognosis is quite guarded at the moment.      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  No significant changes on neurological examination.      Physical/Neurological Exam  [name],  Cardiovascular: normal rate and rhythm  Pulmonary: CTAB    Patient intubated  Patient does not follow any central or peripheral commands  Does not attempt to speak  Disconjugate gaze  Pupils react to light bilaterally  Oculocephalics intact  Corneal reflex intact bilaterally  No BTT bilaterally  No facial droop  Cough and gag are intact  Tongue is midline under ETT  Motor   LUE: 3/5   RUE: extensor posturing   BLE: 1/5 to pain  No abnormal movements  Normal tone throughout      OBJECTIVE  Vital Signs  Temp:  [97.1 ??F (36.2 ??C)-100 ??F (37.8 ??C)]   Pulse (Heart Rate):  [85-131]  BP: (100-183)/(58-109)   Resp Rate:  [12-22]   O2 Sat (%):  [100 %]   Weight:  [59 kg (130 lb)-65.6 kg (144 lb 10 oz)]     MEDICATIONS    Current Facility-Administered Medications:   ???  potassium chloride 10 mEq in 100 ml IVPB, 10 mEq, IntraVENous, Q1H, Kardar, Ahmed Marin Olp, MD  ???  insulin glargine (LANTUS) injection 5 Units, 5 Units, SubCUTAneous, QHS, Kardar, Ahmed Marin Olp, MD  ???  insulin lispro (HUMALOG) injection 3 Units, 3 Units, SubCUTAneous, TIDPC, Kardar, Ahmed Marin Olp, MD  ???   aspirin delayed-release tablet 81 mg, 81 mg, Oral, DAILY, Haillee Johann N, MD  ???  atorvastatin (LIPITOR) tablet 40 mg, 40 mg, Oral, DAILY, Carl Butner N, MD  ???  dextrose 5% lactated ringers infusion, 100 mL/hr, IntraVENous, CONTINUOUS, Reasoner, Vladimir Crofts, PA-C, Last Rate: 100 mL/hr at 11/12/19 2239, 100 mL/hr at 11/12/19 2239  ???  meropenem (MERREM) 1 g in sterile water (preservative free) 20 mL IV syringe, 1 g, IntraVENous, Q12H, Kardar, Ahmed Marin Olp, MD, 1 g at 11/12/19 2107  ???  pantoprazole (PROTONIX) 40 mg in 0.9% sodium chloride 10 mL injection, 40 mg, IntraVENous, DAILY, Kardar, Ahmed Marin Olp, MD, 40 mg at 11/12/19 9702  ???  chlorhexidine (PERIDEX) 0.12 % mouthwash 15 mL, 15 mL, Oral, Q12H, Kardar, Ahmed Marin Olp, MD, 15 mL at 11/12/19 2108  ???  levoFLOXacin (LEVAQUIN) 500 mg in D5W IVPB, 500 mg, IntraVENous, Q48H, Kardar, Ahmed Marin Olp, MD, Last Rate: 100 mL/hr at 11/12/19 1758, 500 mg at 11/12/19 1758  ???  albuterol-ipratropium (DUO-NEB) 2.5 MG-0.5 MG/3 ML, 3 mL, Nebulization, Q6H PRN, Mughal, Zahid, MD  ???  propofol (DIPRIVAN) 10 mg/mL infusion, 0-50 mcg/kg/min (Order-Specific), IntraVENous, TITRATE, Pettis, Geroge Baseman, MD, Last Rate: 8.2 mL/hr at 11/13/19 0444, 15 mcg/kg/min at 11/13/19 0444  ???  glucose chewable tablet 16 g, 4 Tab, Oral, PRN, Bishop, Krysten, PA-C  ???  dextrose (D50W) injection syrg 12.5-25 g, 25-50 mL, IntraVENous, PRN, Bishop, Krysten, PA-C, 25 g at 11/11/19 0133  ???  glucagon (GLUCAGEN) injection 1 mg, 1 mg, IntraMUSCular, PRN, Bishop, Krysten, PA-C  ???  insulin lispro (HUMALOG) injection, , SubCUTAneous, AC&HS, Bishop, Krysten, PA-C, 4 Units at 11/13/19 0055  ???  metoprolol (LOPRESSOR) injection 5 mg, 5 mg, IntraVENous, Q6H PRN, Bishop, Krysten, PA-C, 5 mg at 11/12/19 1350  ???  sodium chloride (NS) flush 5-40 mL, 5-40 mL, IntraVENous, Q8H, Bishop, Krysten, PA-C, 10 mL at 11/13/19 6378  ???  sodium chloride (NS) flush 5-40 mL, 5-40 mL, IntraVENous, PRN, Bishop, Krysten, PA-C, 10 mL  at 11/11/19 0134  ???  acetaminophen (TYLENOL) tablet 650 mg, 650 mg, Oral, Q6H PRN **OR** acetaminophen (TYLENOL) suppository 650 mg, 650 mg, Rectal, Q6H PRN, Bishop, Krysten, PA-C  ???  polyethylene glycol (MIRALAX) packet 17 g, 17 g, Oral, DAILY PRN, Bishop, Krysten, PA-C  ???  ondansetron (ZOFRAN ODT) tablet 4 mg, 4 mg, Oral, Q8H PRN **OR** ondansetron (ZOFRAN) injection 4 mg, 4 mg, IntraVENous, Q6H PRN, Bishop, Krysten, PA-C, 4 mg at 11/10/19 2346  ???  heparin (porcine) injection 5,000 Units, 5,000 Units, SubCUTAneous, Q8H, Bishop, Krysten, PA-C, 5,000 Units at 11/13/19 5885      Labs: I've reviewed the labs for today     This document has been prepared by the Dragon voice recognition system, typographical errors may have occurred. Attempts have been made to correct errors, however inadvertent errors may persist.

## 2019-11-15 ENCOUNTER — Other Ambulatory Visit: Payer: Self-pay | Admitting: Adult Health

## 2019-11-15 ENCOUNTER — Telehealth: Payer: Self-pay | Admitting: Adult Health

## 2019-11-15 DIAGNOSIS — F319 Bipolar disorder, unspecified: Secondary | ICD-10-CM

## 2019-11-15 LAB — GLUCOSE, POC
Glucose (POC): 272 mg/dL — ABNORMAL HIGH (ref 65–100)
Glucose (POC): 312 mg/dL — ABNORMAL HIGH (ref 65–100)
Glucose (POC): 238 mg/dL — ABNORMAL HIGH (ref 65–100)
Glucose (POC): 249 mg/dL — ABNORMAL HIGH (ref 65–100)
Glucose (POC): 291 mg/dL — ABNORMAL HIGH (ref 65–100)
Glucose (POC): 178 mg/dL — ABNORMAL HIGH (ref 65–100)
Glucose (POC): 227 mg/dL — ABNORMAL HIGH (ref 65–100)

## 2019-11-15 LAB — POCT GLUCOSE
POC Glucose: 272 mg/dL — ABNORMAL HIGH (ref 65–100)
POC Glucose: 312 mg/dL — ABNORMAL HIGH (ref 65–100)
POC Glucose: 238 mg/dL — ABNORMAL HIGH (ref 65–100)
POC Glucose: 249 mg/dL — ABNORMAL HIGH (ref 65–100)
POC Glucose: 291 mg/dL — ABNORMAL HIGH (ref 65–100)
POC Glucose: 178 mg/dL — ABNORMAL HIGH (ref 65–100)
POC Glucose: 227 mg/dL — ABNORMAL HIGH (ref 65–100)

## 2019-11-15 MED FILL — PROPOFOL 10 MG/ML IV EMUL: 10 mg/mL | INTRAVENOUS | Qty: 100

## 2019-11-15 MED FILL — HUMALOG U-100 INSULIN 100 UNIT/ML SUBCUTANEOUS SOLUTION: 100 unit/mL | SUBCUTANEOUS | Qty: 3

## 2019-11-15 MED FILL — ASPIRIN 81 MG TAB, DELAYED RELEASE: 81 mg | ORAL | Qty: 1

## 2019-11-15 MED FILL — CHLORHEXIDINE GLUCONATE 0.12 % MOUTHWASH: 0.12 % | Qty: 15

## 2019-11-15 MED FILL — MEROPENEM 1 GRAM IV SOLR: 1 gram | INTRAVENOUS | Qty: 1

## 2019-11-15 MED FILL — LANTUS U-100 INSULIN 100 UNIT/ML SUBCUTANEOUS SOLUTION: 100 unit/mL | SUBCUTANEOUS | Qty: 5

## 2019-11-15 MED FILL — HUMALOG U-100 INSULIN 100 UNIT/ML SUBCUTANEOUS SOLUTION: 100 unit/mL | SUBCUTANEOUS | Qty: 5

## 2019-11-15 MED FILL — PROPOFOL 10 MG/ML IV EMUL: 10 mg/mL | INTRAVENOUS | Qty: 50

## 2019-11-15 MED FILL — HEPARIN (PORCINE) 5,000 UNIT/ML IJ SOLN: 5000 unit/mL | INTRAMUSCULAR | Qty: 1

## 2019-11-15 MED FILL — ATORVASTATIN 40 MG TAB: 40 mg | ORAL | Qty: 1

## 2019-11-15 MED FILL — HUMALOG U-100 INSULIN 100 UNIT/ML SUBCUTANEOUS SOLUTION: 100 unit/mL | SUBCUTANEOUS | Qty: 4

## 2019-11-15 NOTE — Telephone Encounter (Signed)
Spoke to pt daughter stephanie and she stated that pt was  rushed to the hospital on 11/09/2018. Colletta Maryland stated that Pt was treated as a cancer but she advised that pt did not have cancer. Colletta Maryland claimed that she begged the on call staff to give pt a MRI. Pt was given an MRI and was dx with  "pres". Colletta Maryland stated that pt was not breathing that good when she was rushed to the hospital but seems to be breathing better now. Colletta Maryland stated that the Ed staff is now giving her the option to be taking off the ventilator. Colletta Maryland is asking for PCP advise.

## 2019-11-15 NOTE — Telephone Encounter (Signed)
Patient's daughter states that patient was diagnosed with "pres" and is currently in ICU at [facility] in [place]. Patient's daughter is very upset, requesting to speak with [name] or his assistant to discuss if patient should be taken off of ventilator. Per FC, PCP and CMA are unavailable. Patient's daughter, Jake Seats, requesting call back as soon as possible to discuss. Please advise.

## 2019-11-15 NOTE — Progress Notes (Signed)
Unstageable pressure ulcer to left heel, mepilex applied.

## 2019-11-15 NOTE — Progress Notes (Signed)
Pulmonology and Critical Care Progress Note    Subjective:       Patient seen and examined in ICU  Overnight events noted    Remains critically ill  Intubated on mechanical ventilation  Currently on assist control rate of 12, tidal volume 450, FiO2 30%, PEEP of 5  MRI of brain done yesterday which showed anoxic brain changes.    Had CT scan of head done with contrast yesterday which did not show any malignant spread to the brain   remains on propofol To keep her Heart rate in range.   Not responding to stimuli per the nurse when taken off sedation  Not requiring vasopressors at this time    Review of Systems:  Review of systems not obtained due to patient factors.    Current Facility-Administered Medications   Medication Dose Route Frequency Provider Last Rate Last Admin   ??? insulin glargine (LANTUS) injection 5 Units  5 Units SubCUTAneous QHS Kardar, Ahmed Marin Olp, MD   5 Units at 11/14/19 2219   ??? insulin lispro (HUMALOG) injection 3 Units  3 Units SubCUTAneous TIDPC Alisia Ferrari, MD   3 Units at 11/14/19 1730   ??? aspirin delayed-release tablet 81 mg  81 mg Oral DAILY Colan Neptune, MD   81 mg at 11/14/19 2956   ??? atorvastatin (LIPITOR) tablet 40 mg  40 mg Oral DAILY Colan Neptune, MD   40 mg at 11/14/19 0943   ??? meropenem (MERREM) 1 g in sterile water (preservative free) 20 mL IV syringe  1 g IntraVENous Q12H Kardar, Ahmed Marin Olp, MD   1 g at 11/14/19 2219   ??? chlorhexidine (PERIDEX) 0.12 % mouthwash 15 mL  15 mL Oral Q12H Kardar, Ahmed Marin Olp, MD   15 mL at 11/14/19 2330   ??? levoFLOXacin (LEVAQUIN) 500 mg in D5W IVPB  500 mg IntraVENous Q48H Kardar, Ahmed Marin Olp, MD 100 mL/hr at 11/14/19 1629 500 mg at 11/14/19 1629   ??? albuterol-ipratropium (DUO-NEB) 2.5 MG-0.5 MG/3 ML  3 mL Nebulization Q6H PRN Glena Norfolk, MD        ??? propofol (DIPRIVAN) 10 mg/mL infusion  0-50 mcg/kg/min (Order-Specific) IntraVENous TITRATE Forde Radon, MD 10.9 mL/hr at 11/15/19 0537 20 mcg/kg/min at 11/15/19 0537   ??? glucose chewable tablet 16 g  4 Tab Oral PRN Margie Ege, PA-C       ??? dextrose (D50W) injection syrg 12.5-25 g  25-50 mL IntraVENous PRN Bishop, Krysten, PA-C   25 g at 11/11/19 0133   ??? glucagon (GLUCAGEN) injection 1 mg  1 mg IntraMUSCular PRN Bishop, Krysten, PA-C       ??? insulin lispro (HUMALOG) injection   SubCUTAneous AC&HS Bishop, Tonye Pearson, PA-C   4 Units at 11/14/19 2330   ??? metoprolol (LOPRESSOR) injection 5 mg  5 mg IntraVENous Q6H PRN Bishop, Krysten, PA-C   5 mg at 11/12/19 1350   ??? sodium chloride (NS) flush 5-40 mL  5-40 mL IntraVENous Q8H Bishop, Krysten, PA-C   10 mL at 11/15/19 0600   ??? sodium chloride (NS) flush 5-40 mL  5-40 mL IntraVENous PRN Bishop, Krysten, PA-C   10 mL at 11/11/19 0134   ??? acetaminophen (TYLENOL) tablet 650 mg  650 mg Oral Q6H PRN Bishop, Krysten, PA-C        Or   ??? acetaminophen (TYLENOL) suppository 650 mg  650 mg Rectal Q6H PRN Margie Ege, PA-C       ??? polyethylene glycol (  MIRALAX) packet 17 g  17 g Oral DAILY PRN Bishop, Krysten, PA-C       ??? ondansetron (ZOFRAN ODT) tablet 4 mg  4 mg Oral Q8H PRN Bishop, Krysten, PA-C        Or   ??? ondansetron (ZOFRAN) injection 4 mg  4 mg IntraVENous Q6H PRN Bishop, Krysten, PA-C   4 mg at 11/10/19 2346   ??? heparin (porcine) injection 5,000 Units  5,000 Units SubCUTAneous Q8H Bishop, Krysten, PA-C   5,000 Units at 11/15/19 0538            No Known Allergies        Objective:     Blood pressure 111/69, pulse (!) 102, temperature 98.3 ??F (36.8 ??C), resp. rate 12, height 5' 0.98" (1.549 m), weight 68.8 kg (151 lb 10.8 oz), SpO2 100 %. Temp (24hrs), Avg:98.1 ??F (36.7 ??C), Min:97.7 ??F (36.5 ??C), Max:98.7 ??F (37.1 ??C)      Intake and Output:  Current Shift: No intake/output data recorded.  Last 3 Shifts: 01/04 1901 - 01/06 0700  In: 1787.4 [I.V.:1292.4]   Out: 1300 [Urine:1300]    Physical Exam:    General:  Lying in bed, intubated on mechanical ventilation  Throat and Neck: Supple  Lung:  Patient has good air entry bilaterally.  No wheezing was appreciated.   Heart: S1+S2.  No murmurs, tachycardia  Abdomen: soft, non-tender. Bowel sounds normal. No masses; obese  Extremities: No edema  GU: Not done  Skin: No cyanosis   Neurologic:  Sedated on mechanical ventilation  Psychiatric:  Unable to perform due to patient's condition    Lab/Data Review:  Recent Results (from the past 24 hour(s))   GLUCOSE, POC    Collection Time: 11/14/19  4:06 PM   Result Value Ref Range    Glucose (POC) 300 (H) 65 - 100 mg/dL    Performed by Hoy Morn    GLUCOSE, POC    Collection Time: 11/14/19  8:08 PM   Result Value Ref Range    Glucose (POC) 272 (H) 65 - 100 mg/dL    Performed by Artemio Aly EMILY    GLUCOSE, POC    Collection Time: 11/14/19 11:19 PM   Result Value Ref Range    Glucose (POC) 312 (H) 65 - 100 mg/dL    Performed by Artemio Aly EMILY    GLUCOSE, POC    Collection Time: 11/15/19  3:09 AM   Result Value Ref Range    Glucose (POC) 238 (H) 65 - 100 mg/dL    Performed by Artemio Aly EMILY    GLUCOSE, POC    Collection Time: 11/15/19  7:54 AM   Result Value Ref Range    Glucose (POC) 249 (H) 65 - 100 mg/dL    Performed by Ahing Asha      chest X-ray      MRI BRAIN W WO CONT   Final Result   IMPRESSION: Restricted diffusion in the posterior temporal frontal parietal and   occipital regions consistent with hypoxic ischemic encephalopathy. Sparing of   the basal ganglia, thalamus and posterior fossa. Differential diagnoses includes   PRES (posterior reversible encephalopathy syndrome). Repeat MRI in a week's time   is recommended.      Prominent microvascular ischemic changes as described above.      CT HEAD WO CONT   Final Result   IMPRESSION: Similar findings compared to CT head 11/12/2019.  XR CHEST PORT   Final Result    Impression: Underexpanded lungs with bibasilar atelectasis.  No significant   interval change.         CTA HEAD   Final Result   IMPRESSION:   1.  No evidence of acute intracranial process on noncontrast CT head.   2.  No large vessel occlusion, hemodynamically significant luminal stenosis or   intracranial aneurysm.      XR CHEST PORT   Final Result   Impression:    No acute cardiopulmonary process.      NM LUNG SCAN PERF   Final Result   Impression:   Normal perfusion scintigraphy.         XR CHEST SNGL V   Final Result   IMPRESSION:   1.  The endotracheal tube tip overlies the tracheal airway approximately 37 mm   above the carina.   2.  The lungs are clear without acute pulmonary parenchymal pathology.        XR CHEST SNGL V   Final Result   IMPRESSION:   1.  The lung parenchyma is clear without acute pulmonary parenchymal pathology.   2.  There is milder ectasia of the thoracic aorta related to mild   atherosclerotic vascular changes.        CT HEAD WO CONT   Final Result   IMPRESSION:    1.  This examination is negative for acute intracranial pathology.   2.  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   3.  There is apparent proptosis more pronounced on the right compared to left.                CT SPINE CERV WO CONT   Final Result   Impression:    1.  This examination is negative for an acute fracture or a subluxation injury   to the cervical spine.   2.  There is moderate degenerative disc disease at the C5-C6 level.   3.  There is right-sided facet arthropathy at the C2-C3 and the C3-C4 levels.   There is narrowing of the right neural foramen at C3-C4 level. There is   left-sided facet arthropathy primarily at the C2-C3 C4-C5 levels. There is   narrowing of the left C4-C5 neural foramen.                CT Results  (Last 48 hours)               11/13/19 1331  CT HEAD WO CONT Final result     Impression:  IMPRESSION: Similar findings compared to CT head 11/12/2019.                       Narrative:  CT head.       Comparison CT head November 12, 2019.       Axial images are reviewed along with reformatted sagittal/coronal images.    Dose reduction: All CT scans at this facility are performed using dose reduction   optimization techniques as appropriate to a performed exam including the   following-   automated exposure control, adjustments of mA and/or Kv according to patient   size, or use of iterative reconstructive technique.       Bone windows demonstrate partial opacification imaged sphenoethmoid air cells.   Some dependent fluid and mucosal wall thickening noted left maxillary sinus.   Some fluid  through mastoid air cells. Support tubing in place.        Review of intracranial content reveals diffuse low density through   periventricular/subcortical white matter similar compared to prior imaging.   Although findings are nonspecific, favor gliosis probably on the basis of   nonacute end vessel occlusive change. No hydrocephalus. No intracranial   hemorrhage.                 Assessment:     1.  A 69 year old African-American female with multiple medical problems including history of prior stroke and lung cancer who has been on chemotherapy, last chemotherapy in December 2020 , presented with unresponsiveness.      1.  Acute respiratory failure :  She was intubated enroute for airway protection.  Currently, she is off propofol.  She is minimally responsive, only opening eyes.  Apparently, she has a history of lung cancer.  Chest x-ray showed underinflation    Currently on assist control as outlined above  ABGs and chest x-ray reviewed  Ventilator settings modified, FiO2 decreased to 24%  ABG results from yesterday were reviewed which showed 7.5 1/33/83/27/97 on 30% FiO2  No further changes on the ventilator support.    2.  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     Her eyes are open but does not give any meaningful response.  As discussed above,   Neurology is consulted.    Neurology noted that patient was posturing on right side.  CT scan of head with contrast was done which was unremarkable  Of the brain was done yesterday which showed ischemic changes in the brain    IMPRESSION: Restricted diffusion in the posterior temporal frontal parietal and   occipital regions consistent with hypoxic ischemic encephalopathy. Sparing of   the basal ganglia, thalamus and posterior fossa. Differential diagnoses includes   PRES (posterior reversible encephalopathy syndrome). Repeat MRI in a week's time   is recommended.     3.  History of lung cancer.    Patient has received last chemotherapy in December 2020, details are not available.    Chest x-ray, however, does not show any significant mass or nodule or other chronic changes.    4.  History of stroke.  5.  Diabetes mellitus.    6.  For nutrition, she can be started on Glucerna.  We will get a nutritionist input as well.  7.  Tachycardia:  She was started on Cardizem infusion at 5 mg yesterday but did not control her heart rate, her blood pressure dropped therefore it was discontinued.    For deep venous thrombosis prophylaxis, the patient is started on heparin subcutaneously, which will be continued.  For gastrointestinal stress prophylaxis, she is started on Protonix IV which will be continued.    Family is going to decide today about goals of treatment/withdrawal from the ventilator.      Thank you for involving me in the care of this patient  I will follow with you closely during hospitalization    Time spent more than 50 minutes in direct patient care with no overlap reviewing results and records, decision-making, and answering questions.     This document has been prepared by the Dragon voice recognition system, typographical errors may have occurred. Attempts have been made to correct errors, however inadvertent errors may persist.    Martyn Malay, MD  Pulmonary  [facility])  11/15/2019

## 2019-11-15 NOTE — Progress Notes (Signed)
CM notes patient remains on the vent and a definitive DC plan has not been made although, per previous CM notes, the patients daughter would like for her to return home at DC.     CM will continue to follow for discharge planning needs

## 2019-11-15 NOTE — Progress Notes (Signed)
Daughter at bedside this am.  sayd her brothers will not be here until tomorrow.  They plan to make patient comfort care tomorrow.  Daughter wishes for mom to remain full code until everyone has been able to say goodbye.

## 2019-11-15 NOTE — Progress Notes (Signed)
Bedside shift report given to Ashley McQuillen RN

## 2019-11-15 NOTE — Progress Notes (Signed)
Comprehensive Nutrition Assessment    Type and Reason for Visit: Reassess(interim)    Nutrition Recommendations/Plan:  Initiate TF via NG of Promote at goal of 86ml/hr continuous  Add 1 pkt prosource daily (60kcals, 15 g pro)  Free water flushes of 8ml q4h  Goal feeds provide 1380 kcals, 98g pro, and fluids (meets ~100% needs)              Propofol providing 218kcals/day (116%kcal needs with TF)    Adjust insulin to promote euglycemia    Document TF rate, protein modular provision, water flushes, and GRVs in EMR      Nutrition Assessment:  Admitted for unresponsiveness, intubated in ED. Pending work-up for cause of episode. Vomited pta, ?aspiration pna. Neuro following- CT head without acute findings, no evidence of mets to brain. EEG (1/4) showing mod-severe encephalopathy, MRI (1/5) finding hypoxic ischemia. Pt remains intubated, not requiring pressors. Sedated on Propofol, however noted unresponsive off sedation. TF initiated 1/4, achieved goal rate same day. Remains at goal with limited interruptions. RN endorses Prosource provision and good tolerance of TF. Pt likely to transition to comfort care. Labs: BG 291, Mg 1.4. Meds: Prop at 66mcg/kg/min, statin, heparin, insulin, levofloxacin, meropenem, antiemetics, PPI, KCL.    Malnutrition Assessment:  Malnutrition Status:  No malnutrition    Context:  Acute illness         Estimated Daily Nutrient Needs:  Energy (kcal): 1380kcals (PSU 2003b); Weight Used for Energy Requirements: Current  Protein (g): 97g (1.5g/kg); Weight Used for Protein Requirements: Current  Fluid (ml/day): (adult minimum); Method Used for Fluid Requirements: Other (comment)      Nutrition Related Findings:  NFPE finding no acute deficits. No edema. +vomiting pta and in ED. Last BM pta.  Unknown hx dysphagia.      Wounds:    Full thickness, Deep tissue injury(L heel)       Current Nutrition Therapies:  DIET NPO  DIET TUBE FEEDING Promote Promote; 1 pkt prosource daily     Anthropometric Measures:  ?? Height:  5' 0.98" (154.9 cm)  ?? Current Body Wt:  64.5 kg (142 lb 3.2 oz)(1/2)   ?? Admission Body Wt:  142 lb 3.2 oz(1/2)    ?? Usual Body Wt:  (uta)     ?? Ideal Body Wt:  105 lbs:  135.4 %   ?? BMI Category:  Overweight (BMI 25.0-29.9)       Nutrition Diagnosis:   ?? Inadequate oral intake related to impaired respiratory function as evidenced by nutrition support-enteral nutrition      Nutrition Interventions:   Food and/or Nutrient Delivery: Continue NPO, Continue tube feeding  Nutrition Education and Counseling: No recommendations at this time, Education not appropriate  Coordination of Nutrition Care: Continue to monitor while inpatient    Goals:  intakes >/=75% of EENs in 7 days (progressing)  wt maintenance within +/-0.5kg in 7 days (progressing)    Nutrition Monitoring and Evaluation:   Behavioral-Environmental Outcomes: None identified  Food/Nutrient Intake Outcomes: Enteral nutrition intake/tolerance  Physical Signs/Symptoms Outcomes: Weight, Nausea/vomiting    Discharge Planning:    Too soon to determine     Electronically signed by [name] on 11/15/2019 at 2:29 PM    Contact: [phone]

## 2019-11-15 NOTE — Other (Signed)
Spoke with [place] with [facility].She is here evaluating patient. Should have a plan shortly.

## 2019-11-15 NOTE — Progress Notes (Signed)
Hospitalist Progress Note               Daily Progress Note: 11/15/2019      Subjective:   The patient is seen for follow up.  She is a 69 year old female admitted on 1/1 after being found unresponsive by family.  She has a history of stage IV lung cancer, stroke, and diabetes.  She recently moved from [place], [place] to live with her daughter.  Family apparently started CPR at home although unclear if she ever lost a pulse.  Upon arrival of EMS she had stable vital signs and a pulse.  She was intubated by EMS.  CT of the head showed no acute changes.  Patient has remained on the ventilator, unresponsive.  Echo showed an EF of 71%.  Chest x-ray shows bibasilar atelectasis.  Patient had vomiting prior to intubation and is currently on Merrem for possible aspiration.    Patient is being followed by neurology who is noted no response to sternal rub, no withdrawal to pain and not following commands.    Eyes are open but patient not following commands or interacting in any meaningful way.  She does appear to grimace to noxious stimuli but other than that unresponsive.    Met with the family yesterday, discussed grim prognosis.  They will be making a decision about palliative care today apparently    Her MRI yesterday was consistent with anoxic brain injury      Problem List:  Problem List as of 11/15/2019 Never Reviewed          Codes Class Noted - Resolved    AMS (altered mental status) ICD-10-CM: R41.82  ICD-9-CM: 780.97  11/10/2019 - Present              Medications reviewed  Current Facility-Administered Medications   Medication Dose Route Frequency   ??? insulin glargine (LANTUS) injection 5 Units  5 Units SubCUTAneous QHS   ??? insulin lispro (HUMALOG) injection 3 Units  3 Units SubCUTAneous TIDPC   ??? aspirin delayed-release tablet 81 mg  81 mg Oral DAILY   ??? atorvastatin (LIPITOR) tablet 40 mg  40 mg Oral DAILY   ??? dextrose 5% lactated ringers infusion  100 mL/hr IntraVENous CONTINUOUS    ??? meropenem (MERREM) 1 g in sterile water (preservative free) 20 mL IV syringe  1 g IntraVENous Q12H   ??? chlorhexidine (PERIDEX) 0.12 % mouthwash 15 mL  15 mL Oral Q12H   ??? levoFLOXacin (LEVAQUIN) 500 mg in D5W IVPB  500 mg IntraVENous Q48H   ??? albuterol-ipratropium (DUO-NEB) 2.5 MG-0.5 MG/3 ML  3 mL Nebulization Q6H PRN   ??? propofol (DIPRIVAN) 10 mg/mL infusion  0-50 mcg/kg/min (Order-Specific) IntraVENous TITRATE   ??? glucose chewable tablet 16 g  4 Tab Oral PRN   ??? dextrose (D50W) injection syrg 12.5-25 g  25-50 mL IntraVENous PRN   ??? glucagon (GLUCAGEN) injection 1 mg  1 mg IntraMUSCular PRN   ??? insulin lispro (HUMALOG) injection   SubCUTAneous AC&HS   ??? metoprolol (LOPRESSOR) injection 5 mg  5 mg IntraVENous Q6H PRN   ??? sodium chloride (NS) flush 5-40 mL  5-40 mL IntraVENous Q8H   ??? sodium chloride (NS) flush 5-40 mL  5-40 mL IntraVENous PRN   ??? acetaminophen (TYLENOL) tablet 650 mg  650 mg Oral Q6H PRN    Or   ??? acetaminophen (TYLENOL) suppository 650 mg  650 mg Rectal Q6H PRN   ??? polyethylene glycol (MIRALAX) packet 17 g  17 g Oral DAILY PRN   ??? ondansetron (ZOFRAN ODT) tablet 4 mg  4 mg Oral Q8H PRN    Or   ??? ondansetron (ZOFRAN) injection 4 mg  4 mg IntraVENous Q6H PRN   ??? heparin (porcine) injection 5,000 Units  5,000 Units SubCUTAneous Q8H       Review of Systems:   Review of systems not obtained due to patient factors.    Objective:   Physical Exam:     Visit Vitals  BP 111/69   Pulse (!) 102   Temp 98.3 ??F (36.8 ??C)   Resp 12   Ht 5' 0.98" (1.549 m)   Wt 68.8 kg (151 lb 10.8 oz)   SpO2 100%   BMI 28.67 kg/m??      O2 Device: Ventilator    Temp (24hrs), Avg:98.1 ??F (36.7 ??C), Min:97.7 ??F (36.5 ??C), Max:98.7 ??F (37.1 ??C)    No intake/output data recorded.   01/04 1901 - 01/06 0700  In: 1787.4 [I.V.:1292.4]  Out: 1300 [Urine:1300]    General:   Intubated, unresponsive   Lungs:   Clear to auscultation bilaterally.   Chest wall:  No tenderness or deformity.    Heart:  Regular rate and rhythm, S1, S2 normal, no murmur, click, rub or gallop.   Abdomen:   Soft, non-tender. Bowel sounds normal. No masses,  No organomegaly.   Extremities: Extremities normal, atraumatic, no cyanosis or edema.   Pulses: 2+ and symmetric all extremities.   Skin: Skin color, texture, turgor normal. No rashes or lesions   Neurologic: CNII-XII intact.  Normal oculocephalic reflex.  No response to painful stimuli.  No withdrawal to pain         Data Review:       Recent Days:  Recent Labs     11/13/19  0400   WBC 6.7   HGB 10.6*   HCT 32.4*   PLT 249     Recent Labs     11/14/19  0515 11/13/19  0400   NA 138 142   K 4.3 3.6   CL 107 110*   CO2 25 26   GLU 257* 132*   BUN 16 11   CREA 1.09* 0.98   CA 8.7 8.8   MG  --  1.4*     Recent Labs     11/14/19  0515 11/13/19  0345   PH 7.51* 7.53*   PCO2 33* 31*   PO2 83 151*   HCO3 27* 27*   FIO2 24.0 35.0       24 Hour Results:  Recent Results (from the past 24 hour(s))   GLUCOSE, POC    Collection Time: 11/14/19  7:52 AM   Result Value Ref Range    Glucose (POC) 299 (H) 65 - 100 mg/dL    Performed by [place], POC    Collection Time: 11/14/19  4:06 PM   Result Value Ref Range    Glucose (POC) 300 (H) 65 - 100 mg/dL    Performed by Dereck Ligas    GLUCOSE, POC    Collection Time: 11/14/19  8:08 PM   Result Value Ref Range    Glucose (POC) 272 (H) 65 - 100 mg/dL    Performed by [place], POC    Collection Time: 11/14/19 11:19 PM   Result Value Ref Range    Glucose (POC) 312 (H) 65 - 100 mg/dL    Performed by Hulen Shouts EMILY  GLUCOSE, POC    Collection Time: 11/15/19  3:09 AM   Result Value Ref Range    Glucose (POC) 238 (H) 65 - 100 mg/dL    Performed by Hulen Shouts EMILY        MRI BRAIN W WO CONT   Final Result   IMPRESSION: Restricted diffusion in the posterior temporal frontal parietal and   occipital regions consistent with hypoxic ischemic encephalopathy. Sparing of    the basal ganglia, thalamus and posterior fossa. Differential diagnoses includes   PRES (posterior reversible encephalopathy syndrome). Repeat MRI in a week's time   is recommended.      Prominent microvascular ischemic changes as described above.      CT HEAD WO CONT   Final Result   IMPRESSION: Similar findings compared to CT head 11/12/2019.                  XR CHEST PORT   Final Result   Impression: Underexpanded lungs with bibasilar atelectasis.  No significant   interval change.         CTA HEAD   Final Result   IMPRESSION:   1.  No evidence of acute intracranial process on noncontrast CT head.   2.  No large vessel occlusion, hemodynamically significant luminal stenosis or   intracranial aneurysm.      XR CHEST PORT   Final Result   Impression:    No acute cardiopulmonary process.      NM LUNG SCAN PERF   Final Result   Impression:   Normal perfusion scintigraphy.         XR CHEST SNGL V   Final Result   IMPRESSION:   1.  The endotracheal tube tip overlies the tracheal airway approximately 37 mm   above the carina.   2.  The lungs are clear without acute pulmonary parenchymal pathology.        XR CHEST SNGL V   Final Result   IMPRESSION:   1.  The lung parenchyma is clear without acute pulmonary parenchymal pathology.   2.  There is milder ectasia of the thoracic aorta related to mild   atherosclerotic vascular changes.        CT HEAD WO CONT   Final Result   IMPRESSION:    1.  This examination is negative for acute intracranial pathology.   2.  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   3.  There is apparent proptosis more pronounced on the right compared to left.                CT SPINE CERV WO CONT   Final Result   Impression:    1.  This examination is negative for an acute fracture or a subluxation injury   to the cervical spine.   2.  There is moderate degenerative disc disease at the C5-C6 level.    3.  There is right-sided facet arthropathy at the C2-C3 and the C3-C4 levels.   There is narrowing of the right neural foramen at C3-C4 level. There is   left-sided facet arthropathy primarily at the C2-C3 C4-C5 levels. There is   narrowing of the left C4-C5 neural foramen.                 Assessment:  Severe encephalopathy, anoxic encephalopathy.  Suspect she had a cardiac arrest event at home with ROSC    Acute respiratory failure  with hypoxia, requiring mechanical ventilation    Aspiration pneumonia    History of lung cancer, details unknown    Diabetes mellitus type 2    History of stroke      Plan:  Continue mechanical ventilation and supportive care  Continue IV Merrem and levofloxacin  Prognosis is grim  We are waiting to hear back from family      Care Plan discussed with: Nurse    Total time spent with patient: 30 minutes.    Ludwig Clarks, MD

## 2019-11-15 NOTE — Progress Notes (Signed)
NEUROLOGY  PROGRESS NOTE    Admission History/Pertinent Events  Kristin Cooper is a 69 y.o. year old female who presented on 11/10/2019. Patient has a past medical history of Lung Cancer, HTN, DM, HL who initially presented after being found unresponsive by family.  Per chart review, patient had several episodes of unresponsiveness on the week prior to presentation.  Per daughter's report.  Patient reportedly has behavioral arrest and then has staring episodes with decreased responsiveness.  On day of presentation, patient had a similar episode but then had vomiting thereafter.  Patient became more responsive than usual and family members reportedly started CPR.  EMS came to find patient had a good pulse but intubated patient and gave her naloxone due to concerns for airway protection.  Patient's glucose is initially was found to be 53. Emergent CT head was negative for any acute findings.  Chest x-ray concerning for possible aspiration pneumonia for which patient was started on IV antibiotics.    Home Stroke Prophylaxis: Atorvastatin 40      ASSESSMENT/PLAN      Impression  Reviewed patient's MRI which is concerning for diffuse hypoxic brain injury.  Do not feel that patient's symptoms are result of posterior reversible encephalopathy syndrome as patient has not had any improvement in her mentation even with controlled systolic blood pressures.  Given diffuse nature of patient's injury, and no significant improvement on neurological examination prognosis is poor.      [place] WO  NAICA  Remote left caudate and right anterior limb of the internal capsule infarcts  CMIC    CTH WO (11/12/18 d/t new RUE extensor posturing)  No interval changes from prior study    EEG  Moderate to severe generalized encephalopathy without evidence of cortical irritability    MRI Brain WWO  Restricted diffusion in the bilateral frontal, parietal, temporal and occipital cortical regions most concerning for hypoxic ischemic injury      Plan       1) Encephalopathy  Associated: Hypoxic Brain Injury, Lung Cancer, Hypoglycemia, PNA  -Q4NeuroChecks, TELE  -Seizure Precautions  -No AEDs at the current time  -STAT IV lorazepam 2 mg with any clinical seizure activity lasting greater than 3 minutes and contact Neurology for further recommendations  -Management of infectious/metabolic derangements to referring teams    2) Remote Basal Gangliar Infarcts  -Stroke Prophylaxis: ASA 81, Atorvastatin 40  -SBP Goal < 160        SUBJECTIVE   Patient currently on a propofol drip, levofloxacin and meropenem.  No significant changes on neurological examination.      Physical/Neurological Exam  [name],  Cardiovascular: normal rate and rhythm  Pulmonary: CTAB    Patient intubated  Patient does not follow any central or peripheral commands  Does not attempt to speak  Disconjugate gaze  Pupils react to light bilaterally  Oculocephalics intact  Corneal reflex intact bilaterally  No BTT bilaterally  No facial droop  Cough and gag are intact  Tongue is midline under ETT  Motor   LUE: 3/5   RUE: extensor posturing   BLE: 1/5 to pain  No abnormal movements  Normal tone throughout      OBJECTIVE  Vital Signs  Temp:  [97.1 ??F (36.2 ??C)-98.8 ??F (37.1 ??C)]   Pulse (Heart Rate):  [85-130]   BP: (79-178)/(58-109)   Resp Rate:  [12-16]   O2 Sat (%):  [95 %-100 %]   Weight:  [65.6 kg (144 lb 10 oz)-65.7 kg (144 lb 13.5  oz)]     MEDICATIONS    Current Facility-Administered Medications:   ???  insulin glargine (LANTUS) injection 5 Units, 5 Units, SubCUTAneous, QHS, Kardar, Ahmed Marin Olp, MD, 5 Units at 11/13/19 2126  ???  insulin lispro (HUMALOG) injection 3 Units, 3 Units, SubCUTAneous, TIDPC, Kardar, Ahmed Marin Olp, MD, 3 Units at 11/13/19 1721  ???  aspirin delayed-release tablet 81 mg, 81 mg, Oral, DAILY, Colan Neptune, MD, 81 mg at 11/13/19 3354  ???  atorvastatin (LIPITOR) tablet 40 mg, 40 mg, Oral, DAILY, Harold Hedge, Cadence Haslam N, MD, 40 mg at 11/13/19 5625   ???  dextrose 5% lactated ringers infusion, 100 mL/hr, IntraVENous, CONTINUOUS, Reasoner, Vladimir Crofts, PA-C, Last Rate: 100 mL/hr at 11/14/19 0220, 100 mL/hr at 11/14/19 0220  ???  meropenem (MERREM) 1 g in sterile water (preservative free) 20 mL IV syringe, 1 g, IntraVENous, Q12H, Kardar, Ahmed Marin Olp, MD, 1 g at 11/13/19 2045  ???  chlorhexidine (PERIDEX) 0.12 % mouthwash 15 mL, 15 mL, Oral, Q12H, Kardar, Ahmed Marin Olp, MD, 15 mL at 11/13/19 2044  ???  levoFLOXacin (LEVAQUIN) 500 mg in D5W IVPB, 500 mg, IntraVENous, Q48H, Kardar, Ahmed Marin Olp, MD, Last Rate: 100 mL/hr at 11/12/19 1758, 500 mg at 11/12/19 1758  ???  albuterol-ipratropium (DUO-NEB) 2.5 MG-0.5 MG/3 ML, 3 mL, Nebulization, Q6H PRN, Mughal, Zahid, MD  ???  propofol (DIPRIVAN) 10 mg/mL infusion, 0-50 mcg/kg/min (Order-Specific), IntraVENous, TITRATE, Pettis, Geroge Baseman, MD, Last Rate: 10.9 mL/hr at 11/14/19 0850, 20 mcg/kg/min at 11/14/19 0850  ???  glucose chewable tablet 16 g, 4 Tab, Oral, PRN, Bishop, Krysten, PA-C  ???  dextrose (D50W) injection syrg 12.5-25 g, 25-50 mL, IntraVENous, PRN, Bishop, Krysten, PA-C, 25 g at 11/11/19 0133  ???  glucagon (GLUCAGEN) injection 1 mg, 1 mg, IntraMUSCular, PRN, Bishop, Krysten, PA-C  ???  insulin lispro (HUMALOG) injection, , SubCUTAneous, AC&HS, Bishop, Krysten, PA-C, 5 Units at 11/14/19 0745  ???  metoprolol (LOPRESSOR) injection 5 mg, 5 mg, IntraVENous, Q6H PRN, Bishop, Krysten, PA-C, 5 mg at 11/12/19 1350  ???  sodium chloride (NS) flush 5-40 mL, 5-40 mL, IntraVENous, Q8H, Bishop, Krysten, PA-C, 10 mL at 11/14/19 0600  ???  sodium chloride (NS) flush 5-40 mL, 5-40 mL, IntraVENous, PRN, Bishop, Krysten, PA-C, 10 mL at 11/11/19 0134  ???  acetaminophen (TYLENOL) tablet 650 mg, 650 mg, Oral, Q6H PRN **OR** acetaminophen (TYLENOL) suppository 650 mg, 650 mg, Rectal, Q6H PRN, Bishop, Krysten, PA-C  ???  polyethylene glycol (MIRALAX) packet 17 g, 17 g, Oral, DAILY PRN, Bishop, Krysten, PA-C   ???  ondansetron (ZOFRAN ODT) tablet 4 mg, 4 mg, Oral, Q8H PRN **OR** ondansetron (ZOFRAN) injection 4 mg, 4 mg, IntraVENous, Q6H PRN, Bishop, Krysten, PA-C, 4 mg at 11/10/19 2346  ???  heparin (porcine) injection 5,000 Units, 5,000 Units, SubCUTAneous, Q8H, Bishop, Krysten, PA-C, 5,000 Units at 11/14/19 0600      Labs: I've reviewed the labs for today     This document has been prepared by the [employer] voice recognition system, typographical errors may have occurred. Attempts have been made to correct errors, however inadvertent errors may persist.

## 2019-11-15 NOTE — Progress Notes (Signed)
 Bedside shift report given to [facility]. McQuillen RN

## 2019-11-15 NOTE — Progress Notes (Signed)
Unstageable pressure ulcer to left heel, mepilex applied.

## 2019-11-15 NOTE — Progress Notes (Signed)
NEUROLOGY  PROGRESS NOTE    Admission History/Pertinent Events  Kristin Cooper is a 69 y.o. year old female who presented on 11/10/2019. Patient has a past medical history of Lung Cancer, HTN, DM, HL who initially presented after being found unresponsive by family.  Per chart review, patient had several episodes of unresponsiveness on the week prior to presentation.  Per daughter's report.  Patient reportedly has behavioral arrest and then has staring episodes with decreased responsiveness.  On day of presentation, patient had a similar episode but then had vomiting thereafter.  Patient became more responsive than usual and family members reportedly started CPR.  EMS came to find patient had a good pulse but intubated patient and gave her naloxone due to concerns for airway protection.  Patient's glucose is initially was found to be 53. Emergent CT head was negative for any acute findings.  Chest x-ray concerning for possible aspiration pneumonia for which patient was started on IV antibiotics.    Home Stroke Prophylaxis: Atorvastatin 40      ASSESSMENT/PLAN      Impression  Reviewed patient's MRI which is concerning for diffuse hypoxic brain injury.  Do not feel that patient's symptoms are result of posterior reversible encephalopathy syndrome as patient has not had any improvement in her mentation even with controlled systolic blood pressures.  Given diffuse nature of patient's injury, and no significant improvement on neurological examination prognosis is poor.      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    CTH WO (11/12/18 d/t new RUE extensor posturing)  No interval changes from prior study    EEG  Moderate to severe generalized encephalopathy without evidence of cortical irritability    MRI Brain WWO  Restricted diffusion in the bilateral frontal, parietal, temporal and occipital cortical regions most concerning for hypoxic ischemic  injury      Plan      1) Encephalopathy  Associated: Hypoxic Brain Injury, Lung Cancer, Hypoglycemia, PNA  -Q4NeuroChecks, TELE  -Seizure Precautions  -No AEDs at the current time  -STAT IV lorazepam 2 mg with any clinical seizure activity lasting greater than 3 minutes and contact Neurology for further recommendations  -Management of infectious/metabolic derangements to referring teams    2) Remote Basal Gangliar Infarcts  -Stroke Prophylaxis: ASA 81, Atorvastatin 40  -SBP Goal < 160        SUBJECTIVE   Patient currently on a propofol drip, levofloxacin and meropenem.  No significant changes on neurological examination.      Physical/Neurological Exam  [name],  Cardiovascular: normal rate and rhythm  Pulmonary: CTAB    Patient intubated  Patient does not follow any central or peripheral commands  Does not attempt to speak  Disconjugate gaze  Pupils react to light bilaterally  Oculocephalics intact  Corneal reflex intact bilaterally  No BTT bilaterally  No facial droop  Cough and gag are intact  Tongue is midline under ETT  Motor   LUE: 3/5   RUE: extensor posturing   BLE: 1/5 to pain  No abnormal movements  Normal tone throughout      OBJECTIVE  Vital Signs  Temp:  [97.1 ??F (36.2 ??C)-98.8 ??F (37.1 ??C)]   Pulse (Heart Rate):  [85-130]   BP: (79-178)/(58-109)   Resp Rate:  [12-16]   O2 Sat (%):  [95 %-100 %]   Weight:  [65.6 kg (144 lb 10 oz)-65.7 kg (144 lb 13.5  oz)]     MEDICATIONS    Current Facility-Administered Medications:   ???  insulin glargine (LANTUS) injection 5 Units, 5 Units, SubCUTAneous, QHS, Kardar, Ahmed Mertie Moores, MD, 5 Units at 11/13/19 2126  ???  insulin lispro (HUMALOG) injection 3 Units, 3 Units, SubCUTAneous, TIDPC, Kardar, Ahmed Mertie Moores, MD, 3 Units at 11/13/19 1721  ???  aspirin delayed-release tablet 81 mg, 81 mg, Oral, DAILY, Linward Natal, MD, 81 mg at 11/13/19 [id number]  ???  atorvastatin (LIPITOR) tablet 40 mg, 40 mg, Oral, DAILY, Izetta Dakin, Bing Duffey N, MD, 40 mg at 11/13/19  [id number]  ???  dextrose 5% lactated ringers infusion, 100 mL/hr, IntraVENous, CONTINUOUS, Reasoner, Gasper Sells, PA-C, Last Rate: 100 mL/hr at 11/14/19 0220, 100 mL/hr at 11/14/19 0220  ???  meropenem (MERREM) 1 g in sterile water (preservative free) 20 mL IV syringe, 1 g, IntraVENous, Q12H, Kardar, Ahmed Mertie Moores, MD, 1 g at 11/13/19 2045  ???  chlorhexidine (PERIDEX) 0.12 % mouthwash 15 mL, 15 mL, Oral, Q12H, Kardar, Ahmed Mertie Moores, MD, 15 mL at 11/13/19 2044  ???  levoFLOXacin (LEVAQUIN) 500 mg in D5W IVPB, 500 mg, IntraVENous, Q48H, Kardar, Ahmed Mertie Moores, MD, Last Rate: 100 mL/hr at 11/12/19 1758, 500 mg at 11/12/19 1758  ???  albuterol-ipratropium (DUO-NEB) 2.5 MG-0.5 MG/3 ML, 3 mL, Nebulization, Q6H PRN, Mughal, Zahid, MD  ???  propofol (DIPRIVAN) 10 mg/mL infusion, 0-50 mcg/kg/min (Order-Specific), IntraVENous, TITRATE, Pettis, Marsh Dolly, MD, Last Rate: 10.9 mL/hr at 11/14/19 0850, 20 mcg/kg/min at 11/14/19 0850  ???  glucose chewable tablet 16 g, 4 Tab, Oral, PRN, Bishop, Krysten, PA-C  ???  dextrose (D50W) injection syrg 12.5-25 g, 25-50 mL, IntraVENous, PRN, Bishop, Krysten, PA-C, 25 g at 11/11/19 0133  ???  glucagon (GLUCAGEN) injection 1 mg, 1 mg, IntraMUSCular, PRN, Bishop, Krysten, PA-C  ???  insulin lispro (HUMALOG) injection, , SubCUTAneous, AC&HS, Bishop, Krysten, PA-C, 5 Units at 11/14/19 0745  ???  metoprolol (LOPRESSOR) injection 5 mg, 5 mg, IntraVENous, Q6H PRN, Bishop, Krysten, PA-C, 5 mg at 11/12/19 1350  ???  sodium chloride (NS) flush 5-40 mL, 5-40 mL, IntraVENous, Q8H, Bishop, Krysten, PA-C, 10 mL at 11/14/19 0600  ???  sodium chloride (NS) flush 5-40 mL, 5-40 mL, IntraVENous, PRN, Bishop, Krysten, PA-C, 10 mL at 11/11/19 0134  ???  acetaminophen (TYLENOL) tablet 650 mg, 650 mg, Oral, Q6H PRN **OR** acetaminophen (TYLENOL) suppository 650 mg, 650 mg, Rectal, Q6H PRN, Bishop, Krysten, PA-C  ???  polyethylene glycol (MIRALAX) packet 17 g, 17 g, Oral, DAILY PRN, Bishop, Krysten, PA-C  ???  ondansetron (ZOFRAN ODT)  tablet 4 mg, 4 mg, Oral, Q8H PRN **OR** ondansetron (ZOFRAN) injection 4 mg, 4 mg, IntraVENous, Q6H PRN, Bishop, Krysten, PA-C, 4 mg at 11/10/19 2346  ???  heparin (porcine) injection 5,000 Units, 5,000 Units, SubCUTAneous, Q8H, Bishop, Krysten, PA-C, 5,000 Units at 11/14/19 0600      Labs: I've reviewed the labs for today     This document has been prepared by the [employer] voice recognition system, typographical errors may have occurred. Attempts have been made to correct errors, however inadvertent errors may persist.

## 2019-11-15 NOTE — Progress Notes (Signed)
Daughter at bedside this am.  sayd her brothers will not be here until tomorrow.  They plan to make patient comfort care tomorrow.  Daughter wishes for mom to remain full code until everyone has been able to say goodbye.

## 2019-11-15 NOTE — Progress Notes (Signed)
Pulmonology and Critical Care Progress Note    Subjective:       Patient seen and examined in ICU  Overnight events noted    Remains critically ill  Intubated on mechanical ventilation  Currently on assist control rate of 12, tidal volume 450, FiO2 30%, PEEP of 5  MRI of brain done yesterday which showed anoxic brain changes.    Had CT scan of head done with contrast yesterday which did not show any malignant spread to the brain   remains on propofol To keep her Heart rate in range.   Not responding to stimuli per the nurse when taken off sedation  Not requiring vasopressors at this time    Review of Systems:  Review of systems not obtained due to patient factors.    Current Facility-Administered Medications   Medication Dose Route Frequency Provider Last Rate Last Admin   ??? insulin glargine (LANTUS) injection 5 Units  5 Units SubCUTAneous QHS Kardar, Ahmed Marin Olp, MD   5 Units at 11/14/19 2219   ??? insulin lispro (HUMALOG) injection 3 Units  3 Units SubCUTAneous TIDPC Alisia Ferrari, MD   3 Units at 11/14/19 1730   ??? aspirin delayed-release tablet 81 mg  81 mg Oral DAILY Colan Neptune, MD   81 mg at 11/14/19 2458   ??? atorvastatin (LIPITOR) tablet 40 mg  40 mg Oral DAILY Colan Neptune, MD   40 mg at 11/14/19 0943   ??? meropenem (MERREM) 1 g in sterile water (preservative free) 20 mL IV syringe  1 g IntraVENous Q12H Kardar, Ahmed Marin Olp, MD   1 g at 11/14/19 2219   ??? chlorhexidine (PERIDEX) 0.12 % mouthwash 15 mL  15 mL Oral Q12H Kardar, Ahmed Marin Olp, MD   15 mL at 11/14/19 2330   ??? levoFLOXacin (LEVAQUIN) 500 mg in D5W IVPB  500 mg IntraVENous Q48H Kardar, Ahmed Marin Olp, MD 100 mL/hr at 11/14/19 1629 500 mg at 11/14/19 1629   ??? albuterol-ipratropium (DUO-NEB) 2.5 MG-0.5 MG/3 ML  3 mL Nebulization Q6H PRN Glena Norfolk, MD       ??? propofol (DIPRIVAN) 10 mg/mL infusion  0-50 mcg/kg/min (Order-Specific) IntraVENous TITRATE Forde Radon, MD 10.9 mL/hr at 11/15/19 0537 20 mcg/kg/min at  11/15/19 0537   ??? glucose chewable tablet 16 g  4 Tab Oral PRN Margie Ege, PA-C       ??? dextrose (D50W) injection syrg 12.5-25 g  25-50 mL IntraVENous PRN Bishop, Krysten, PA-C   25 g at 11/11/19 0133   ??? glucagon (GLUCAGEN) injection 1 mg  1 mg IntraMUSCular PRN Bishop, Krysten, PA-C       ??? insulin lispro (HUMALOG) injection   SubCUTAneous AC&HS Bishop, Tonye Pearson, PA-C   4 Units at 11/14/19 2330   ??? metoprolol (LOPRESSOR) injection 5 mg  5 mg IntraVENous Q6H PRN Bishop, Krysten, PA-C   5 mg at 11/12/19 1350   ??? sodium chloride (NS) flush 5-40 mL  5-40 mL IntraVENous Q8H Bishop, Krysten, PA-C   10 mL at 11/15/19 0600   ??? sodium chloride (NS) flush 5-40 mL  5-40 mL IntraVENous PRN Bishop, Krysten, PA-C   10 mL at 11/11/19 0134   ??? acetaminophen (TYLENOL) tablet 650 mg  650 mg Oral Q6H PRN Bishop, Krysten, PA-C        Or   ??? acetaminophen (TYLENOL) suppository 650 mg  650 mg Rectal Q6H PRN Margie Ege, PA-C       ??? polyethylene glycol (  MIRALAX) packet 17 g  17 g Oral DAILY PRN Bishop, Krysten, PA-C       ??? ondansetron (ZOFRAN ODT) tablet 4 mg  4 mg Oral Q8H PRN Bishop, Krysten, PA-C        Or   ??? ondansetron (ZOFRAN) injection 4 mg  4 mg IntraVENous Q6H PRN Bishop, Krysten, PA-C   4 mg at 11/10/19 2346   ??? heparin (porcine) injection 5,000 Units  5,000 Units SubCUTAneous Q8H Bishop, Krysten, PA-C   5,000 Units at 11/15/19 0538            No Known Allergies        Objective:     Blood pressure 111/69, pulse (!) 102, temperature 98.3 ??F (36.8 ??C), resp. rate 12, height 5' 0.98" (1.549 m), weight 68.8 kg (151 lb 10.8 oz), SpO2 100 %. Temp (24hrs), Avg:98.1 ??F (36.7 ??C), Min:97.7 ??F (36.5 ??C), Max:98.7 ??F (37.1 ??C)      Intake and Output:  Current Shift: No intake/output data recorded.  Last 3 Shifts: 01/04 1901 - 01/06 0700  In: 1787.4 [I.V.:1292.4]  Out: 1300 [Urine:1300]    Physical Exam:    General:  Lying in bed, intubated on mechanical ventilation  Throat and Neck: Supple  Lung:  Patient has good air entry  bilaterally.  No wheezing was appreciated.   Heart: S1+S2.  No murmurs, tachycardia  Abdomen: soft, non-tender. Bowel sounds normal. No masses; obese  Extremities: No edema  GU: Not done  Skin: No cyanosis   Neurologic:  Sedated on mechanical ventilation  Psychiatric:  Unable to perform due to patient's condition    Lab/Data Review:  Recent Results (from the past 24 hour(s))   GLUCOSE, POC    Collection Time: 11/14/19  4:06 PM   Result Value Ref Range    Glucose (POC) 300 (H) 65 - 100 mg/dL    Performed by Hoy Morn    GLUCOSE, POC    Collection Time: 11/14/19  8:08 PM   Result Value Ref Range    Glucose (POC) 272 (H) 65 - 100 mg/dL    Performed by Artemio Aly EMILY    GLUCOSE, POC    Collection Time: 11/14/19 11:19 PM   Result Value Ref Range    Glucose (POC) 312 (H) 65 - 100 mg/dL    Performed by Artemio Aly EMILY    GLUCOSE, POC    Collection Time: 11/15/19  3:09 AM   Result Value Ref Range    Glucose (POC) 238 (H) 65 - 100 mg/dL    Performed by Artemio Aly EMILY    GLUCOSE, POC    Collection Time: 11/15/19  7:54 AM   Result Value Ref Range    Glucose (POC) 249 (H) 65 - 100 mg/dL    Performed by Ahing Asha      chest X-ray      MRI BRAIN W WO CONT   Final Result   IMPRESSION: Restricted diffusion in the posterior temporal frontal parietal and   occipital regions consistent with hypoxic ischemic encephalopathy. Sparing of   the basal ganglia, thalamus and posterior fossa. Differential diagnoses includes   PRES (posterior reversible encephalopathy syndrome). Repeat MRI in a week's time   is recommended.      Prominent microvascular ischemic changes as described above.      CT HEAD WO CONT   Final Result   IMPRESSION: Similar findings compared to CT head 11/12/2019.  XR CHEST PORT   Final Result   Impression: Underexpanded lungs with bibasilar atelectasis.  No significant   interval change.         CTA HEAD   Final Result   IMPRESSION:   1.  No evidence of acute intracranial process on noncontrast CT head.    2.  No large vessel occlusion, hemodynamically significant luminal stenosis or   intracranial aneurysm.      XR CHEST PORT   Final Result   Impression:    No acute cardiopulmonary process.      NM LUNG SCAN PERF   Final Result   Impression:   Normal perfusion scintigraphy.         XR CHEST SNGL V   Final Result   IMPRESSION:   1.  The endotracheal tube tip overlies the tracheal airway approximately 37 mm   above the carina.   2.  The lungs are clear without acute pulmonary parenchymal pathology.        XR CHEST SNGL V   Final Result   IMPRESSION:   1.  The lung parenchyma is clear without acute pulmonary parenchymal pathology.   2.  There is milder ectasia of the thoracic aorta related to mild   atherosclerotic vascular changes.        CT HEAD WO CONT   Final Result   IMPRESSION:    1.  This examination is negative for acute intracranial pathology.   2.  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   3.  There is apparent proptosis more pronounced on the right compared to left.                CT SPINE CERV WO CONT   Final Result   Impression:    1.  This examination is negative for an acute fracture or a subluxation injury   to the cervical spine.   2.  There is moderate degenerative disc disease at the C5-C6 level.   3.  There is right-sided facet arthropathy at the C2-C3 and the C3-C4 levels.   There is narrowing of the right neural foramen at C3-C4 level. There is   left-sided facet arthropathy primarily at the C2-C3 C4-C5 levels. There is   narrowing of the left C4-C5 neural foramen.                CT Results  (Last 48 hours)               11/13/19 1331  CT HEAD WO CONT Final result    Impression:  IMPRESSION: Similar findings compared to CT head 11/12/2019.                       Narrative:  CT head.       Comparison CT head November 12, 2019.       Axial images are reviewed along with reformatted sagittal/coronal images.    Dose reduction: All CT  scans at this facility are performed using dose reduction   optimization techniques as appropriate to a performed exam including the   following-   automated exposure control, adjustments of mA and/or Kv according to patient   size, or use of iterative reconstructive technique.       Bone windows demonstrate partial opacification imaged sphenoethmoid air cells.   Some dependent fluid and mucosal wall thickening noted left maxillary sinus.   Some fluid  through mastoid air cells. Support tubing in place.        Review of intracranial content reveals diffuse low density through   periventricular/subcortical white matter similar compared to prior imaging.   Although findings are nonspecific, favor gliosis probably on the basis of   nonacute end vessel occlusive change. No hydrocephalus. No intracranial   hemorrhage.                 Assessment:     1.  A 69 year old African-American female with multiple medical problems including history of prior stroke and lung cancer who has been on chemotherapy, last chemotherapy in December 2020 , presented with unresponsiveness.      1.  Acute respiratory failure :  She was intubated enroute for airway protection.  Currently, she is off propofol.  She is minimally responsive, only opening eyes.  Apparently, she has a history of lung cancer.  Chest x-ray showed underinflation    Currently on assist control as outlined above  ABGs and chest x-ray reviewed  Ventilator settings modified, FiO2 decreased to 24%  ABG results from yesterday were reviewed which showed 7.5 1/33/83/27/97 on 30% FiO2  No further changes on the ventilator support.    2.  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    Her eyes are open but does not give any meaningful response.  As discussed above,   Neurology is consulted.    Neurology noted that patient was posturing on right side.  CT scan of head with contrast was done which was unremarkable  Of the brain was done yesterday which showed ischemic changes  in the brain    IMPRESSION: Restricted diffusion in the posterior temporal frontal parietal and   occipital regions consistent with hypoxic ischemic encephalopathy. Sparing of   the basal ganglia, thalamus and posterior fossa. Differential diagnoses includes   PRES (posterior reversible encephalopathy syndrome). Repeat MRI in a week's time   is recommended.     3.  History of lung cancer.    Patient has received last chemotherapy in December 2020, details are not available.    Chest x-ray, however, does not show any significant mass or nodule or other chronic changes.    4.  History of stroke.  5.  Diabetes mellitus.    6.  For nutrition, she can be started on Glucerna.  We will get a nutritionist input as well.  7.  Tachycardia:  She was started on Cardizem infusion at 5 mg yesterday but did not control her heart rate, her blood pressure dropped therefore it was discontinued.    For deep venous thrombosis prophylaxis, the patient is started on heparin subcutaneously, which will be continued.  For gastrointestinal stress prophylaxis, she is started on Protonix IV which will be continued.    Family is going to decide today about goals of treatment/withdrawal from the ventilator.      Thank you for involving me in the care of this patient  I will follow with you closely during hospitalization    Time spent more than 50 minutes in direct patient care with no overlap reviewing results and records, decision-making, and answering questions.    This document has been prepared by the Dragon voice recognition system, typographical errors may have occurred. Attempts have been made to correct errors, however inadvertent errors may persist.    Martyn Malay, MD  [facility])  11/15/2019

## 2019-11-15 NOTE — Progress Notes (Signed)
Progress Notes by Ludwig Clarks, MD at 11/15/19 [phone]                Author: Ludwig Clarks, MD  Service: Internal Medicine  Author Type: Physician       Filed: 11/15/19 0745  Date of Service: 11/15/19 0743  Status: Signed          Editor: Ludwig Clarks, MD (Physician)                              Hospitalist Progress Note                   Daily Progress Note: 11/15/2019           Subjective:     The patient is seen for follow up.  She is a 69 year old female admitted on 1/1 after being found unresponsive by family.  She has a history of stage IV lung cancer, stroke, and diabetes.   She recently moved from [place], [place] to live with her daughter.  Family apparently started CPR at home although unclear if she ever lost a pulse.  Upon arrival of EMS she had stable vital signs and a pulse.  She was intubated by EMS.  CT  of the head showed no acute changes.  Patient has remained on the ventilator, unresponsive.  Echo showed an EF of 71%.  Chest x-ray shows bibasilar atelectasis.  Patient had vomiting prior to intubation and is currently on Merrem for possible aspiration.      Patient is being followed by neurology who is noted no response to sternal rub, no withdrawal to pain and not following commands.      Eyes are open but patient not following commands or interacting in any meaningful way.  She does appear to grimace to noxious stimuli but other than that unresponsive.      Met with the family yesterday, discussed grim prognosis.  They will be making a decision about palliative care today apparently      Her MRI yesterday was consistent with anoxic brain injury         Problem List:      Problem List  as of 11/15/2019  Never Reviewed                        Codes  Class  Noted - Resolved             AMS (altered mental status)  ICD-10-CM: R41.82   ICD-9-CM: 780.97    11/10/2019 - Present                          Medications reviewed     Current Facility-Administered Medications           Medication  Dose  Route  Frequency           ?  insulin glargine (LANTUS) injection 5 Units   5 Units  SubCUTAneous  QHS           ?  insulin lispro (HUMALOG) injection 3 Units   3 Units  SubCUTAneous  TIDPC     ?  aspirin delayed-release tablet 81 mg   81 mg  Oral  DAILY     ?  atorvastatin (LIPITOR) tablet 40 mg   40 mg  Oral  DAILY     ?  dextrose 5%  lactated ringers infusion   100 mL/hr  IntraVENous  CONTINUOUS     ?  meropenem (MERREM) 1 g in sterile water (preservative free) 20 mL IV syringe   1 g  IntraVENous  Q12H     ?  chlorhexidine (PERIDEX) 0.12 % mouthwash 15 mL   15 mL  Oral  Q12H     ?  levoFLOXacin (LEVAQUIN) 500 mg in D5W IVPB   500 mg  IntraVENous  Q48H     ?  albuterol-ipratropium (DUO-NEB) 2.5 MG-0.5 MG/3 ML   3 mL  Nebulization  Q6H PRN     ?  propofol (DIPRIVAN) 10 mg/mL infusion   0-50 mcg/kg/min (Order-Specific)  IntraVENous  TITRATE     ?  glucose chewable tablet 16 g   4 Tab  Oral  PRN     ?  dextrose (D50W) injection syrg 12.5-25 g   25-50 mL  IntraVENous  PRN     ?  glucagon (GLUCAGEN) injection 1 mg   1 mg  IntraMUSCular  PRN     ?  insulin lispro (HUMALOG) injection     SubCUTAneous  AC&HS     ?  metoprolol (LOPRESSOR) injection 5 mg   5 mg  IntraVENous  Q6H PRN     ?  sodium chloride (NS) flush 5-40 mL   5-40 mL  IntraVENous  Q8H           ?  sodium chloride (NS) flush 5-40 mL   5-40 mL  IntraVENous  PRN           ?  acetaminophen (TYLENOL) tablet 650 mg   650 mg  Oral  Q6H PRN          Or           ?  acetaminophen (TYLENOL) suppository 650 mg   650 mg  Rectal  Q6H PRN     ?  polyethylene glycol (MIRALAX) packet 17 g   17 g  Oral  DAILY PRN     ?  ondansetron (ZOFRAN ODT) tablet 4 mg   4 mg  Oral  Q8H PRN          Or           ?  ondansetron (ZOFRAN) injection 4 mg   4 mg  IntraVENous  Q6H PRN           ?  heparin (porcine) injection 5,000 Units   5,000 Units  SubCUTAneous  Q8H             Review of Systems:     Review of systems not obtained due to patient factors.        Objective:      Physical Exam:       Visit Vitals      BP  111/69     Pulse  (!) 102     Temp  98.3 ??F (36.8 ??C)     Resp  12     Ht  5' 0.98" (1.549 m)     Wt  68.8 kg (151 lb 10.8 oz)     SpO2  100%        BMI  28.67 kg/m??        O2 Device:  Ventilator      Temp (24hrs), Avg:98.1 ??F (36.7 ??C), Min:97.7 ??F (36.5 ??C), Max:98.7 ??F (37.1 ??C)     No intake/output data recorded.    01/04 1901 - 01/06 0700   In:  1787.4 [I.V.:1292.4]   Out: 1300 [Urine:1300]         General:    Intubated, unresponsive        Lungs:    Clear to auscultation bilaterally.        Chest wall:   No tenderness or deformity.        Heart:   Regular rate and rhythm, S1, S2 normal, no murmur, click, rub or gallop.        Abdomen:    Soft, non-tender. Bowel sounds normal. No masses,  No organomegaly.     Extremities:  Extremities normal, atraumatic, no cyanosis or edema.     Pulses:  2+ and symmetric all extremities.     Skin:  Skin color, texture, turgor normal. No rashes or lesions        Neurologic:  CNII-XII intact.  Normal oculocephalic reflex.  No response to painful stimuli.  No withdrawal to pain                Data Review:          Recent Days:     Recent Labs           11/13/19   0400     WBC  6.7     HGB  10.6*     HCT  32.4*        PLT  249          Recent Labs            11/14/19   0515  11/13/19   0400     NA  138  142     K  4.3  3.6     CL  107  110*     CO2  25  26     GLU  257*  132*     BUN  16  11     CREA  1.09*  0.98     CA  8.7  8.8         MG   --   1.4*          Recent Labs            11/14/19   0515  11/13/19   0345     PH  7.51*  7.53*     PCO2  33*  31*     PO2  83  151*     HCO3  27*  27*         FIO2  24.0  35.0           24 Hour Results:     Recent Results (from the past 24 hour(s))     GLUCOSE, POC          Collection Time: 11/14/19  7:52 AM         Result  Value  Ref Range            Glucose (POC)  299 (H)  65 - 100 mg/dL       Performed by  [place], POC          Collection Time: 11/14/19  4:06 PM          Result  Value  Ref Range            Glucose (POC)  300 (H)  65 - 100 mg/dL       Performed by  Dereck Ligas  GLUCOSE, POC          Collection Time: 11/14/19  8:08 PM         Result  Value  Ref Range            Glucose (POC)  272 (H)  65 - 100 mg/dL       Performed by  [place], POC          Collection Time: 11/14/19 11:19 PM         Result  Value  Ref Range            Glucose (POC)  312 (H)  65 - 100 mg/dL       Performed by  [place], POC          Collection Time: 11/15/19  3:09 AM         Result  Value  Ref Range            Glucose (POC)  238 (H)  65 - 100 mg/dL            Performed by  Hulen Shouts EMILY               MRI BRAIN W WO CONT       Final Result     IMPRESSION: Restricted diffusion in the posterior temporal frontal parietal and     occipital regions consistent with hypoxic ischemic encephalopathy. Sparing of     the basal ganglia, thalamus and posterior fossa. Differential diagnoses includes     PRES (posterior reversible encephalopathy syndrome). Repeat MRI in a week's time     is recommended.          Prominent microvascular ischemic changes as described above.            CT HEAD WO CONT       Final Result     IMPRESSION: Similar findings compared to CT head 11/12/2019.                                XR CHEST PORT       Final Result     Impression: Underexpanded lungs with bibasilar atelectasis.  No significant     interval change.                 CTA HEAD       Final Result     IMPRESSION:     1.  No evidence of acute intracranial process on noncontrast CT head.     2.  No large vessel occlusion, hemodynamically significant luminal stenosis or     intracranial aneurysm.            XR CHEST PORT       Final Result     Impression:      No acute cardiopulmonary process.            NM LUNG SCAN PERF       Final Result     Impression:     Normal perfusion scintigraphy.                 XR CHEST SNGL V       Final Result     IMPRESSION:     1.  The endotracheal  tube tip overlies the tracheal airway approximately 37 mm  above the carina.     2.  The lungs are clear without acute pulmonary parenchymal pathology.              XR CHEST SNGL V       Final Result     IMPRESSION:     1.  The lung parenchyma is clear without acute pulmonary parenchymal pathology.     2.  There is milder ectasia of the thoracic aorta related to mild     atherosclerotic vascular changes.              CT HEAD WO CONT       Final Result     IMPRESSION:      1.  This examination is negative for acute intracranial pathology.     2.  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     3.  There is apparent proptosis more pronounced on the right compared to left.                            CT SPINE CERV WO CONT       Final Result     Impression:      1.  This examination is negative for an acute fracture or a subluxation injury     to the cervical spine.     2.  There is moderate degenerative disc disease at the C5-C6 level.     3.  There is right-sided facet arthropathy at the C2-C3 and the C3-C4 levels.     There is narrowing of the right neural foramen at C3-C4 level. There is     left-sided facet arthropathy primarily at the C2-C3 C4-C5 levels. There is     narrowing of the left C4-C5 neural foramen.                             Assessment:   Severe encephalopathy, anoxic encephalopathy.  Suspect she had a cardiac arrest event at home with ROSC      Acute respiratory failure with hypoxia, requiring mechanical ventilation      Aspiration pneumonia      History of lung cancer, details unknown      Diabetes mellitus type 2      History of stroke         Plan:   Continue mechanical ventilation and supportive care   Continue IV Merrem and levofloxacin   Prognosis is grim   We are waiting to hear back from family         Care Plan discussed with: Nurse      Total time spent with patient: 30 minutes.      Ludwig Clarks, MD

## 2019-11-15 NOTE — Progress Notes (Signed)
CM notes patient remains on the vent and a definitive DC plan has not been made although, per previous CM notes, the patients daughter would like for her to return home at DC.     CM will continue to follow for discharge planning needs

## 2019-11-15 NOTE — Progress Notes (Signed)
 Comprehensive Nutrition Assessment    Type and Reason for Visit: Reassess(interim)    Nutrition Recommendations/Plan:  Initiate TF via NG of Promote at goal of 70ml/hr continuous  Add 1 pkt prosource daily (60kcals, 15 g pro)  Free water flushes of 75ml q4h  Goal feeds provide 1380 kcals, 98g pro, and fluids (meets ~100% needs)              Propofol providing 218kcals/day (116%kcal needs with TF)    Adjust insulin to promote euglycemia    Document TF rate, protein modular provision, water flushes, and GRVs in EMR      Nutrition Assessment:  Admitted for unresponsiveness, intubated in ED. Pending work-up for cause of episode. Vomited pta, ?aspiration pna. Neuro following- CT head without acute findings, no evidence of mets to brain. EEG (1/4) showing mod-severe encephalopathy, MRI (1/5) finding hypoxic ischemia. Pt remains intubated, not requiring pressors. Sedated on Propofol, however noted unresponsive off sedation. TF initiated 1/4, achieved goal rate same day. Remains at goal with limited interruptions. RN endorses Prosource provision and good tolerance of TF. Pt likely to transition to comfort care. Labs: BG 291, Mg 1.4. Meds: Prop at 20mcg/kg/min, statin, heparin, insulin, levofloxacin, meropenem, antiemetics, PPI, KCL.    Malnutrition Assessment:  Malnutrition Status:  No malnutrition    Context:  Acute illness         Estimated Daily Nutrient Needs:  Energy (kcal): 1380kcals (PSU 2003b); Weight Used for Energy Requirements: Current  Protein (g): 97g (1.5g/kg); Weight Used for Protein Requirements: Current  Fluid (ml/day): (adult minimum); Method Used for Fluid Requirements: Other (comment)      Nutrition Related Findings:  NFPE finding no acute deficits. No edema. +vomiting pta and in ED. Last BM pta.  Unknown hx dysphagia.      Wounds:    Full thickness, Deep tissue injury(L heel)       Current Nutrition Therapies:  DIET NPO  DIET TUBE FEEDING Promote Promote; 1 pkt prosource  daily    Anthropometric Measures:   Height:  5' 0.98 (154.9 cm)   Current Body Wt:  64.5 kg (142 lb 3.2 oz)(1/2)    Admission Body Wt:  142 lb 3.2 oz(1/2)     Usual Body Wt:  (uta)      Ideal Body Wt:  105 lbs:  135.4 %    BMI Category:  Overweight (BMI 25.0-29.9)       Nutrition Diagnosis:    Inadequate oral intake related to impaired respiratory function as evidenced by nutrition support-enteral nutrition      Nutrition Interventions:   Food and/or Nutrient Delivery: Continue NPO, Continue tube feeding  Nutrition Education and Counseling: No recommendations at this time, Education not appropriate  Coordination of Nutrition Care: Continue to monitor while inpatient    Goals:  intakes >/=75% of EENs in 7 days (progressing)  wt maintenance within +/-0.5kg in 7 days (progressing)    Nutrition Monitoring and Evaluation:   Behavioral-Environmental Outcomes: None identified  Food/Nutrient Intake Outcomes: Enteral nutrition intake/tolerance  Physical Signs/Symptoms Outcomes: Weight, Nausea/vomiting    Discharge Planning:    Too soon to determine     Electronically signed by Schuyler Sheffield on 11/15/2019 at 2:29 PM    Contact: [phone]

## 2019-11-16 ENCOUNTER — Ambulatory Visit (HOSPITAL_BASED_OUTPATIENT_CLINIC_OR_DEPARTMENT_OTHER): Payer: HMO | Admitting: Internal Medicine

## 2019-11-16 LAB — GLUCOSE, POC
Glucose (POC): 188 mg/dL — ABNORMAL HIGH (ref 65–100)
Glucose (POC): 170 mg/dL — ABNORMAL HIGH (ref 65–100)
Glucose (POC): 154 mg/dL — ABNORMAL HIGH (ref 65–100)
Glucose (POC): 252 mg/dL — ABNORMAL HIGH (ref 65–100)
Glucose (POC): 223 mg/dL — ABNORMAL HIGH (ref 65–100)
Glucose (POC): 189 mg/dL — ABNORMAL HIGH (ref 65–100)

## 2019-11-16 LAB — POCT GLUCOSE
POC Glucose: 188 mg/dL — ABNORMAL HIGH (ref 65–100)
POC Glucose: 170 mg/dL — ABNORMAL HIGH (ref 65–100)
POC Glucose: 154 mg/dL — ABNORMAL HIGH (ref 65–100)
POC Glucose: 252 mg/dL — ABNORMAL HIGH (ref 65–100)
POC Glucose: 223 mg/dL — ABNORMAL HIGH (ref 65–100)
POC Glucose: 189 mg/dL — ABNORMAL HIGH (ref 65–100)

## 2019-11-16 MED ORDER — INSULIN LISPRO 100 UNIT/ML INJECTION
100 unit/mL | Freq: Four times a day (QID) | SUBCUTANEOUS | Status: DC
Start: 2019-11-16 — End: 2019-11-21
  Administered 2019-11-16 – 2019-11-21 (×19): via SUBCUTANEOUS

## 2019-11-16 MED ORDER — MORPHINE 2 MG/ML INJECTION
2 mg/mL | INTRAMUSCULAR | Status: DC | PRN
Start: 2019-11-16 — End: 2019-11-16
  Administered 2019-11-16: 18:00:00 via INTRAVENOUS

## 2019-11-16 MED ORDER — GLYCOPYRROLATE 0.2 MG/ML IJ SOLN
0.2 mg/mL | Freq: Three times a day (TID) | INTRAMUSCULAR | Status: DC | PRN
Start: 2019-11-16 — End: 2019-11-21
  Administered 2019-11-16 – 2019-11-17 (×2): via INTRAVENOUS

## 2019-11-16 MED ORDER — DEXTROSE 50% IN WATER (D50W) IV SYRG
INTRAVENOUS | Status: DC | PRN
Start: 2019-11-16 — End: 2019-11-21

## 2019-11-16 MED ORDER — MEROPENEM 1 GRAM IV SOLR
1 gram | Freq: Two times a day (BID) | INTRAVENOUS | Status: DC
Start: 2019-11-16 — End: 2019-11-21
  Administered 2019-11-16 – 2019-11-20 (×9): via INTRAVENOUS

## 2019-11-16 MED ORDER — PROPOFOL INFUSION
10 mg/mL | INTRAVENOUS | Status: DC
Start: 2019-11-16 — End: 2019-11-21
  Administered 2019-11-16 – 2019-11-17 (×2): via INTRAVENOUS

## 2019-11-16 MED ORDER — GLUCOSE 4 GRAM CHEWABLE TAB
4 gram | ORAL | Status: DC | PRN
Start: 2019-11-16 — End: 2019-11-21

## 2019-11-16 MED ORDER — LORAZEPAM 2 MG/ML IJ SOLN
2 mg/mL | INTRAMUSCULAR | Status: DC | PRN
Start: 2019-11-16 — End: 2019-11-16

## 2019-11-16 MED ORDER — METOPROLOL TARTRATE 5 MG/5 ML IV SOLN
5 mg/ mL | Freq: Four times a day (QID) | INTRAVENOUS | Status: DC | PRN
Start: 2019-11-16 — End: 2019-11-21

## 2019-11-16 MED ORDER — GLUCAGON 1 MG INJECTION
1 mg | INTRAMUSCULAR | Status: DC | PRN
Start: 2019-11-16 — End: 2019-11-21

## 2019-11-16 MED ORDER — INSULIN GLARGINE 100 UNIT/ML INJECTION
100 unit/mL | Freq: Every evening | SUBCUTANEOUS | Status: DC
Start: 2019-11-16 — End: 2019-11-21
  Administered 2019-11-17 – 2019-11-21 (×5): via SUBCUTANEOUS

## 2019-11-16 MED FILL — ATORVASTATIN 40 MG TAB: 40 mg | ORAL | Qty: 1

## 2019-11-16 MED FILL — CHLORHEXIDINE GLUCONATE 0.12 % MOUTHWASH: 0.12 % | Qty: 15

## 2019-11-16 MED FILL — HUMALOG U-100 INSULIN 100 UNIT/ML SUBCUTANEOUS SOLUTION: 100 unit/mL | SUBCUTANEOUS | Qty: 3

## 2019-11-16 MED FILL — HUMALOG U-100 INSULIN 100 UNIT/ML SUBCUTANEOUS SOLUTION: 100 unit/mL | SUBCUTANEOUS | Qty: 2

## 2019-11-16 MED FILL — MEROPENEM 1 GRAM IV SOLR: 1 gram | INTRAVENOUS | Qty: 1

## 2019-11-16 MED FILL — HUMALOG U-100 INSULIN 100 UNIT/ML SUBCUTANEOUS SOLUTION: 100 unit/mL | SUBCUTANEOUS | Qty: 8

## 2019-11-16 MED FILL — ASPIRIN 81 MG TAB, DELAYED RELEASE: 81 mg | ORAL | Qty: 1

## 2019-11-16 MED FILL — GLYCOPYRROLATE 0.2 MG/ML IJ SOLN: 0.2 mg/mL | INTRAMUSCULAR | Qty: 2

## 2019-11-16 MED FILL — HEPARIN (PORCINE) 5,000 UNIT/ML IJ SOLN: 5000 unit/mL | INTRAMUSCULAR | Qty: 1

## 2019-11-16 MED FILL — PROPOFOL 10 MG/ML IV EMUL: 10 mg/mL | INTRAVENOUS | Qty: 50

## 2019-11-16 MED FILL — LANTUS U-100 INSULIN 100 UNIT/ML SUBCUTANEOUS SOLUTION: 100 unit/mL | SUBCUTANEOUS | Qty: 5

## 2019-11-16 MED FILL — MORPHINE 2 MG/ML INJECTION: 2 mg/mL | INTRAMUSCULAR | Qty: 1

## 2019-11-16 MED FILL — DIPRIVAN 10 MG/ML INTRAVENOUS EMULSION: 10 mg/mL | INTRAVENOUS | Qty: 100

## 2019-11-16 NOTE — Progress Notes (Signed)
Hospitalist Progress Note               Daily Progress Note: 11/16/2019      Subjective:   The patient is seen for follow up.   Condition is unchanged.  She remains unresponsive.  She is back on a low-dose propofol drip to control coughing reflex and tachycardia.  Daughter told nursing staff yesterday that the children are planning on making her comfort care today    Problem List:  Problem List as of 11/16/2019 Never Reviewed          Codes Class Noted - Resolved    AMS (altered mental status) ICD-10-CM: R41.82  ICD-9-CM: 780.97  11/10/2019 - Present              Medications reviewed  Current Facility-Administered Medications   Medication Dose Route Frequency   ??? insulin glargine (LANTUS) injection 5 Units  5 Units SubCUTAneous QHS   ??? insulin lispro (HUMALOG) injection 3 Units  3 Units SubCUTAneous TIDPC   ??? aspirin delayed-release tablet 81 mg  81 mg Oral DAILY   ??? atorvastatin (LIPITOR) tablet 40 mg  40 mg Oral DAILY   ??? meropenem (MERREM) 1 g in sterile water (preservative free) 20 mL IV syringe  1 g IntraVENous Q12H   ??? chlorhexidine (PERIDEX) 0.12 % mouthwash 15 mL  15 mL Oral Q12H   ??? levoFLOXacin (LEVAQUIN) 500 mg in D5W IVPB  500 mg IntraVENous Q48H   ??? albuterol-ipratropium (DUO-NEB) 2.5 MG-0.5 MG/3 ML  3 mL Nebulization Q6H PRN   ??? propofol (DIPRIVAN) 10 mg/mL infusion  0-50 mcg/kg/min (Order-Specific) IntraVENous TITRATE   ??? glucose chewable tablet 16 g  4 Tab Oral PRN   ??? dextrose (D50W) injection syrg 12.5-25 g  25-50 mL IntraVENous PRN   ??? glucagon (GLUCAGEN) injection 1 mg  1 mg IntraMUSCular PRN   ??? insulin lispro (HUMALOG) injection   SubCUTAneous AC&HS   ??? metoprolol (LOPRESSOR) injection 5 mg  5 mg IntraVENous Q6H PRN   ??? sodium chloride (NS) flush 5-40 mL  5-40 mL IntraVENous Q8H   ??? sodium chloride (NS) flush 5-40 mL  5-40 mL IntraVENous PRN   ??? acetaminophen (TYLENOL) tablet 650 mg  650 mg Oral Q6H PRN    Or   ??? acetaminophen (TYLENOL) suppository 650 mg  650 mg Rectal Q6H PRN    ??? polyethylene glycol (MIRALAX) packet 17 g  17 g Oral DAILY PRN   ??? ondansetron (ZOFRAN ODT) tablet 4 mg  4 mg Oral Q8H PRN    Or   ??? ondansetron (ZOFRAN) injection 4 mg  4 mg IntraVENous Q6H PRN   ??? heparin (porcine) injection 5,000 Units  5,000 Units SubCUTAneous Q8H       Review of Systems:   Review of systems not obtained due to patient factors.    Objective:   Physical Exam:     Visit Vitals  BP 114/76 (BP 1 Location: Left arm, BP Patient Position: At rest)   Pulse 100   Temp 98.2 ??F (36.8 ??C)   Resp 11   Ht 5' 0.98" (1.549 m)   Wt 68.8 kg (151 lb 10.8 oz)   SpO2 100%   BMI 28.67 kg/m??      O2 Device: Ventilator    Temp (24hrs), Avg:98.4 ??F (36.9 ??C), Min:97.8 ??F (36.6 ??C), Max:99.7 ??F (37.6 ??C)    No intake/output data recorded.   01/05 1901 - 01/07 0700  In: 809   Out: 1601 [[id number]]  General:   Intubated, unresponsive   Lungs:   Clear to auscultation bilaterally.   Chest wall:  No tenderness or deformity.   Heart:  Regular rate and rhythm, S1, S2 normal, no murmur, click, rub or gallop.   Abdomen:   Soft, non-tender. Bowel sounds normal. No masses,  No organomegaly.   Extremities: Extremities normal, atraumatic, no cyanosis or edema.   Pulses: 2+ and symmetric all extremities.   Skin: Skin color, texture, turgor normal. No rashes or lesions   Neurologic: CNII-XII intact.  Normal oculocephalic reflex.  No response to painful stimuli.  No withdrawal to pain         Data Review:       Recent Days:  No results for input(s): WBC, HGB, HCT, PLT, HGBEXT, HCTEXT, PLTEXT, HGBEXT, HCTEXT, PLTEXT in the last 72 hours.  Recent Labs     11/14/19  0515   NA 138   K 4.3   CL 107   CO2 25   GLU 257*   BUN 16   CREA 1.09*   CA 8.7     Recent Labs     11/14/19  0515   PH 7.51*   PCO2 33*   PO2 83   HCO3 27*   FIO2 24.0       24 Hour Results:  Recent Results (from the past 24 hour(s))   GLUCOSE, POC    Collection Time: 11/15/19  7:54 AM   Result Value Ref Range    Glucose (POC) 249 (H) 65 - 100 mg/dL     Performed by Karolee Ohs    GLUCOSE, POC    Collection Time: 11/15/19 10:55 AM   Result Value Ref Range    Glucose (POC) 291 (H) 65 - 100 mg/dL    Performed by Karolee Ohs    GLUCOSE, POC    Collection Time: 11/15/19  2:45 PM   Result Value Ref Range    Glucose (POC) 178 (H) 65 - 100 mg/dL    Performed by Karolee Ohs    GLUCOSE, POC    Collection Time: 11/15/19  5:56 PM   Result Value Ref Range    Glucose (POC) 227 (H) 65 - 100 mg/dL    Performed by Katrina Stack    GLUCOSE, POC    Collection Time: 11/15/19  8:12 PM   Result Value Ref Range    Glucose (POC) 188 (H) 65 - 100 mg/dL    Performed by Artemio Aly EMILY    GLUCOSE, POC    Collection Time: 11/15/19 11:01 PM   Result Value Ref Range    Glucose (POC) 170 (H) 65 - 100 mg/dL    Performed by Artemio Aly EMILY    GLUCOSE, POC    Collection Time: 11/16/19  3:01 AM   Result Value Ref Range    Glucose (POC) 154 (H) 65 - 100 mg/dL    Performed by Charlotte Sanes    GLUCOSE, POC    Collection Time: 11/16/19  7:44 AM   Result Value Ref Range    Glucose (POC) 252 (H) 65 - 100 mg/dL    Performed by Leane Call        MRI BRAIN W WO CONT   Final Result   IMPRESSION: Restricted diffusion in the posterior temporal frontal parietal and   occipital regions consistent with hypoxic ischemic encephalopathy. Sparing of   the basal ganglia, thalamus and posterior fossa. Differential diagnoses includes   PRES (posterior reversible encephalopathy syndrome). Repeat MRI in a week's  time   is recommended.      Prominent microvascular ischemic changes as described above.      CT HEAD WO CONT   Final Result   IMPRESSION: Similar findings compared to CT head 11/12/2019.                  XR CHEST PORT   Final Result   Impression: Underexpanded lungs with bibasilar atelectasis.  No significant   interval change.         CTA HEAD   Final Result   IMPRESSION:   1.  No evidence of acute intracranial process on noncontrast CT head.    2.  No large vessel occlusion, hemodynamically significant luminal stenosis or   intracranial aneurysm.      XR CHEST PORT   Final Result   Impression:    No acute cardiopulmonary process.      NM LUNG SCAN PERF   Final Result   Impression:   Normal perfusion scintigraphy.         XR CHEST SNGL V   Final Result   IMPRESSION:   1.  The endotracheal tube tip overlies the tracheal airway approximately 37 mm   above the carina.   2.  The lungs are clear without acute pulmonary parenchymal pathology.        XR CHEST SNGL V   Final Result   IMPRESSION:   1.  The lung parenchyma is clear without acute pulmonary parenchymal pathology.   2.  There is milder ectasia of the thoracic aorta related to mild   atherosclerotic vascular changes.        CT HEAD WO CONT   Final Result   IMPRESSION:    1.  This examination is negative for acute intracranial pathology.   2.  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   3.  There is apparent proptosis more pronounced on the right compared to left.                CT SPINE CERV WO CONT   Final Result   Impression:    1.  This examination is negative for an acute fracture or a subluxation injury   to the cervical spine.   2.  There is moderate degenerative disc disease at the C5-C6 level.   3.  There is right-sided facet arthropathy at the C2-C3 and the C3-C4 levels.   There is narrowing of the right neural foramen at C3-C4 level. There is   left-sided facet arthropathy primarily at the C2-C3 C4-C5 levels. There is   narrowing of the left C4-C5 neural foramen.                 Assessment:  Severe encephalopathy, anoxic encephalopathy.  Suspect she had a cardiac arrest event at home with ROSC    Acute respiratory failure with hypoxia, requiring mechanical ventilation    Aspiration pneumonia    History of lung cancer, details unknown    Diabetes mellitus type 2    History of stroke      Plan:   Continue mechanical ventilation and supportive care  Likely transition to comfort care today      Care Plan discussed with: Nurse    Total time spent with patient: 30 minutes.    Lieutenant Diego, MD

## 2019-11-16 NOTE — Progress Notes (Signed)
Bedside shift report given to Ashley McQuillen  RN

## 2019-11-16 NOTE — Nurse Consult (Signed)
Received request to speak with family from primary nurse to help with difficult decision regarding comfort care vs aggressive care. Met with daughter along with Dr. Frelier and nurse Ashley. Daughter stated that her brothers felt that the patient squeezed his hand in response to him. Dr. Frelier explained that this is likely a reflex just as it is when she opens her eyes. Provided emotional support; daughter understands prognosis but until her brothers are in agreement, she wishes to continue full code status and current level of care. No comfort code at this time. Brothers are due to visit again Saturday.  Asked if I could visit tomorrow, daughter agreed. Will continue to monitor.

## 2019-11-16 NOTE — Progress Notes (Signed)
NEUROLOGY  PROGRESS NOTE    Admission History/Pertinent Events  Kristin Cooper is a 69 y.o. year old female who presented on 11/10/2019. Patient has a past medical history of Lung Cancer, HTN, DM, HL who initially presented after being found unresponsive by family.  Per chart review, patient had several episodes of unresponsiveness on the week prior to presentation.  Per daughter's report.  Patient reportedly has behavioral arrest and then has staring episodes with decreased responsiveness.  On day of presentation, patient had a similar episode but then had vomiting thereafter.  Patient became more responsive than usual and family members reportedly started CPR.  EMS came to find patient had a good pulse but intubated patient and gave her naloxone due to concerns for airway protection.  Patient's glucose is initially was found to be 53. Emergent CT head was negative for any acute findings.  Chest x-ray concerning for possible aspiration pneumonia for which patient was started on IV antibiotics.    Home Stroke Prophylaxis: Atorvastatin 40      ASSESSMENT/PLAN      Impression  No significant changes on patient's neurological examination.  Patient's prognosis remains poor based on neurological examination and findings on neuroimaging.      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    CTH WO (11/12/18 d/t new RUE extensor posturing)  No interval changes from prior study    EEG  Moderate to severe generalized encephalopathy without evidence of cortical irritability    MRI Brain WWO  Restricted diffusion in the bilateral frontal, parietal, temporal and occipital cortical regions most concerning for hypoxic ischemic injury      Plan      1) Encephalopathy  Associated: Hypoxic Brain Injury, Lung Cancer, Hypoglycemia, PNA  -Q4NeuroChecks, TELE  -Seizure Precautions  -No AEDs at the current time   -STAT IV lorazepam 2 mg with any clinical seizure activity lasting greater than 3 minutes and contact Neurology for further recommendations  -Management of infectious/metabolic derangements to referring teams    2) Remote Basal Gangliar Infarcts  -Stroke Prophylaxis: ASA 81, Atorvastatin 40  -SBP Goal < 160        SUBJECTIVE   Patient on propofol drip, meropenem and levefloxacin.  No significant changes on neurological examination.      Physical/Neurological Exam  [name],  Cardiovascular: normal rate and rhythm  Pulmonary: CTAB    Patient intubated  Patient does not follow any central or peripheral commands  Does not attempt to speak  Disconjugate gaze  Pupils react to light bilaterally  Oculocephalics intact  Corneal reflex intact bilaterally  No BTT bilaterally  No facial droop  Cough and gag are intact  Tongue is midline under ETT  Motor   LUE: 3/5   RUE: extensor posturing   BLE: 1/5 to pain  No abnormal movements  Normal tone throughout      OBJECTIVE  Vital Signs  Temp:  [97.3 ??F (36.3 ??C)-98.7 ??F (37.1 ??C)]   Pulse (Heart Rate):  [85-115]   BP: (72-167)/(60-109)   Resp Rate:  [12-16]   O2 Sat (%):  [95 %-100 %]   Weight:  [65.6 kg (144 lb 10 oz)-68.8 kg (151 lb 10.8 oz)]     MEDICATIONS    Current Facility-Administered Medications:   ???  insulin glargine (LANTUS) injection 5 Units, 5 Units, SubCUTAneous, QHS, Kardar, Ahmed Mertie Moores, MD, 5 Units at 11/14/19 2219  ???  insulin  lispro (HUMALOG) injection 3 Units, 3 Units, SubCUTAneous, TIDPC, Kardar, Ahmed Marin Olp, MD, 3 Units at 11/14/19 1730  ???  aspirin delayed-release tablet 81 mg, 81 mg, Oral, DAILY, Colan Neptune, MD, 81 mg at 11/14/19 0347  ???  atorvastatin (LIPITOR) tablet 40 mg, 40 mg, Oral, DAILY, Colan Neptune, MD, 40 mg at 11/14/19 0943  ???  meropenem (MERREM) 1 g in sterile water (preservative free) 20 mL IV syringe, 1 g, IntraVENous, Q12H, Kardar, Ahmed Marin Olp, MD, 1 g at 11/14/19 2219   ???  chlorhexidine (PERIDEX) 0.12 % mouthwash 15 mL, 15 mL, Oral, Q12H, Kardar, Ahmed Marin Olp, MD, 15 mL at 11/14/19 2330  ???  levoFLOXacin (LEVAQUIN) 500 mg in D5W IVPB, 500 mg, IntraVENous, Q48H, Kardar, Ahmed Marin Olp, MD, Last Rate: 100 mL/hr at 11/14/19 1629, 500 mg at 11/14/19 1629  ???  albuterol-ipratropium (DUO-NEB) 2.5 MG-0.5 MG/3 ML, 3 mL, Nebulization, Q6H PRN, Mughal, Zahid, MD  ???  propofol (DIPRIVAN) 10 mg/mL infusion, 0-50 mcg/kg/min (Order-Specific), IntraVENous, TITRATE, Pettis, Geroge Baseman, MD, Last Rate: 10.9 mL/hr at 11/15/19 0537, 20 mcg/kg/min at 11/15/19 0537  ???  glucose chewable tablet 16 g, 4 Tab, Oral, PRN, Bishop, Krysten, PA-C  ???  dextrose (D50W) injection syrg 12.5-25 g, 25-50 mL, IntraVENous, PRN, Bishop, Krysten, PA-C, 25 g at 11/11/19 0133  ???  glucagon (GLUCAGEN) injection 1 mg, 1 mg, IntraMUSCular, PRN, Bishop, Krysten, PA-C  ???  insulin lispro (HUMALOG) injection, , SubCUTAneous, AC&HS, Bishop, Krysten, PA-C, 4 Units at 11/14/19 2330  ???  metoprolol (LOPRESSOR) injection 5 mg, 5 mg, IntraVENous, Q6H PRN, Bishop, Krysten, PA-C, 5 mg at 11/12/19 1350  ???  sodium chloride (NS) flush 5-40 mL, 5-40 mL, IntraVENous, Q8H, Bishop, Krysten, PA-C, 10 mL at 11/15/19 0600  ???  sodium chloride (NS) flush 5-40 mL, 5-40 mL, IntraVENous, PRN, Bishop, Krysten, PA-C, 10 mL at 11/11/19 0134  ???  acetaminophen (TYLENOL) tablet 650 mg, 650 mg, Oral, Q6H PRN **OR** acetaminophen (TYLENOL) suppository 650 mg, 650 mg, Rectal, Q6H PRN, Bishop, Krysten, PA-C  ???  polyethylene glycol (MIRALAX) packet 17 g, 17 g, Oral, DAILY PRN, Bishop, Krysten, PA-C  ???  ondansetron (ZOFRAN ODT) tablet 4 mg, 4 mg, Oral, Q8H PRN **OR** ondansetron (ZOFRAN) injection 4 mg, 4 mg, IntraVENous, Q6H PRN, Bishop, Krysten, PA-C, 4 mg at 11/10/19 2346  ???  heparin (porcine) injection 5,000 Units, 5,000 Units, SubCUTAneous, Q8H, Bishop, Krysten, PA-C, 5,000 Units at 11/15/19 4259      Labs: I've reviewed the labs for today      This document has been prepared by the Dragon voice recognition system, typographical errors may have occurred. Attempts have been made to correct errors, however inadvertent errors may persist.

## 2019-11-16 NOTE — Progress Notes (Signed)
Pulmonology and Critical Care Progress Note    Subjective:       Patient seen and examined in ICU  Overnight events noted  Intubated on mechanical ventilation  Currently on assist control rate of 12, tidal volume 450, FiO2 30%, PEEP of 5  MRI of brain done yesterday which showed anoxic brain changes.    Had CT scan of head done with contrast yesterday which did not show any malignant spread to the brain   remains on propofol To keep her Heart rate in range.   Not responding to stimuli per the nurse when taken off sedation  Not requiring vasopressors at this time    Review of Systems:  Review of systems not obtained due to patient factors.    Current Facility-Administered Medications   Medication Dose Route Frequency Provider Last Rate Last Admin   ??? insulin glargine (LANTUS) injection 5 Units  5 Units SubCUTAneous QHS Kardar, Ahmed Marin Olp, MD   5 Units at 11/15/19 2106   ??? insulin lispro (HUMALOG) injection 3 Units  3 Units SubCUTAneous TIDPC Kardar, Ahmed Marin Olp, MD   3 Units at 11/16/19 0815   ??? aspirin delayed-release tablet 81 mg  81 mg Oral DAILY Colan Neptune, MD   81 mg at 11/16/19 0815   ??? atorvastatin (LIPITOR) tablet 40 mg  40 mg Oral DAILY Colan Neptune, MD   40 mg at 11/16/19 0815   ??? meropenem (MERREM) 1 g in sterile water (preservative free) 20 mL IV syringe  1 g IntraVENous Q12H Kardar, Ahmed Marin Olp, MD   1 g at 11/16/19 0815   ??? chlorhexidine (PERIDEX) 0.12 % mouthwash 15 mL  15 mL Oral Q12H Kardar, Ahmed Marin Olp, MD   15 mL at 11/16/19 0815   ??? levoFLOXacin (LEVAQUIN) 500 mg in D5W IVPB  500 mg IntraVENous Q48H Kardar, Ahmed Marin Olp, MD 100 mL/hr at 11/14/19 1629 500 mg at 11/14/19 1629   ??? albuterol-ipratropium (DUO-NEB) 2.5 MG-0.5 MG/3 ML  3 mL Nebulization Q6H PRN Glena Norfolk, MD       ??? propofol (DIPRIVAN) 10 mg/mL infusion  0-50 mcg/kg/min (Order-Specific) IntraVENous TITRATE Forde Radon, MD 8.2 mL/hr at 11/16/19 0436 15 mcg/kg/min at 11/16/19 0436    ??? glucose chewable tablet 16 g  4 Tab Oral PRN Margie Ege, PA-C       ??? dextrose (D50W) injection syrg 12.5-25 g  25-50 mL IntraVENous PRN Bishop, Krysten, PA-C   25 g at 11/11/19 0133   ??? glucagon (GLUCAGEN) injection 1 mg  1 mg IntraMUSCular PRN Bishop, Krysten, PA-C       ??? insulin lispro (HUMALOG) injection   SubCUTAneous AC&HS Bishop, Tonye Pearson, PA-C   5 Units at 11/16/19 0814   ??? metoprolol (LOPRESSOR) injection 5 mg  5 mg IntraVENous Q6H PRN Bishop, Krysten, PA-C   5 mg at 11/12/19 1350   ??? sodium chloride (NS) flush 5-40 mL  5-40 mL IntraVENous Q8H Bishop, Krysten, PA-C   10 mL at 11/16/19 0600   ??? sodium chloride (NS) flush 5-40 mL  5-40 mL IntraVENous PRN Bishop, Krysten, PA-C   10 mL at 11/11/19 0134   ??? acetaminophen (TYLENOL) tablet 650 mg  650 mg Oral Q6H PRN Bishop, Krysten, PA-C        Or   ??? acetaminophen (TYLENOL) suppository 650 mg  650 mg Rectal Q6H PRN Margie Ege, PA-C       ??? polyethylene glycol (MIRALAX) packet 17 g  17  g Oral DAILY PRN Bishop, Krysten, PA-C       ??? ondansetron (ZOFRAN ODT) tablet 4 mg  4 mg Oral Q8H PRN Bishop, Krysten, PA-C        Or   ??? ondansetron (ZOFRAN) injection 4 mg  4 mg IntraVENous Q6H PRN Bishop, Krysten, PA-C   4 mg at 11/10/19 2346   ??? heparin (porcine) injection 5,000 Units  5,000 Units SubCUTAneous Q8H Bishop, Krysten, PA-C   5,000 Units at 11/16/19 0549            No Known Allergies        Objective:     Blood pressure 114/76, pulse 100, temperature 98.4 ??F (36.9 ??C), resp. rate 11, height 5' 0.98" (1.549 m), weight 68.8 kg (151 lb 10.8 oz), SpO2 100 %. Temp (24hrs), Avg:98.4 ??F (36.9 ??C), Min:97.8 ??F (36.6 ??C), Max:99.7 ??F (37.6 ??C)      Intake and Output:  Current Shift: 01/07 0701 - 01/07 1900  In: 195   Out: -   Last 3 Shifts: 01/05 1901 - 01/07 0700  In: 809   Out: 0981 [Urine:1600]    Physical Exam:    General:  Lying in bed, intubated on mechanical ventilation  Throat and Neck: Supple   Lung:  Patient has good air entry bilaterally.  No wheezing was appreciated.   Heart: S1+S2.  No murmurs, tachycardia  Abdomen: soft, non-tender. Bowel sounds normal. No masses; obese  Extremities: No edema  GU: Not done  Skin: No cyanosis   Neurologic:  Sedated on mechanical ventilation  Psychiatric:  Unable to perform due to patient's condition    Lab/Data Review:  Recent Results (from the past 24 hour(s))   GLUCOSE, POC    Collection Time: 11/15/19 10:55 AM   Result Value Ref Range    Glucose (POC) 291 (H) 65 - 100 mg/dL    Performed by Jason Coop    GLUCOSE, POC    Collection Time: 11/15/19  2:45 PM   Result Value Ref Range    Glucose (POC) 178 (H) 65 - 100 mg/dL    Performed by Jason Coop    GLUCOSE, POC    Collection Time: 11/15/19  5:56 PM   Result Value Ref Range    Glucose (POC) 227 (H) 65 - 100 mg/dL    Performed by [place], POC    Collection Time: 11/15/19  8:12 PM   Result Value Ref Range    Glucose (POC) 188 (H) 65 - 100 mg/dL    Performed by [place], POC    Collection Time: 11/15/19 11:01 PM   Result Value Ref Range    Glucose (POC) 170 (H) 65 - 100 mg/dL    Performed by [place], POC    Collection Time: 11/16/19  3:01 AM   Result Value Ref Range    Glucose (POC) 154 (H) 65 - 100 mg/dL    Performed by [place], POC    Collection Time: 11/16/19  7:44 AM   Result Value Ref Range    Glucose (POC) 252 (H) 65 - 100 mg/dL    Performed by Cristina Gong      chest X-ray      MRI BRAIN W WO CONT   Final Result   IMPRESSION: Restricted diffusion in the posterior temporal frontal parietal and   occipital regions consistent with hypoxic ischemic encephalopathy. Sparing of  the basal ganglia, thalamus and posterior fossa. Differential diagnoses includes   PRES (posterior reversible encephalopathy syndrome). Repeat MRI in a week's time   is recommended.      Prominent microvascular ischemic changes as described above.      CT HEAD WO CONT    Final Result   IMPRESSION: Similar findings compared to CT head 11/12/2019.                  XR CHEST PORT   Final Result   Impression: Underexpanded lungs with bibasilar atelectasis.  No significant   interval change.         CTA HEAD   Final Result   IMPRESSION:   1.  No evidence of acute intracranial process on noncontrast CT head.   2.  No large vessel occlusion, hemodynamically significant luminal stenosis or   intracranial aneurysm.      XR CHEST PORT   Final Result   Impression:    No acute cardiopulmonary process.      NM LUNG SCAN PERF   Final Result   Impression:   Normal perfusion scintigraphy.         XR CHEST SNGL V   Final Result   IMPRESSION:   1.  The endotracheal tube tip overlies the tracheal airway approximately 37 mm   above the carina.   2.  The lungs are clear without acute pulmonary parenchymal pathology.        XR CHEST SNGL V   Final Result   IMPRESSION:   1.  The lung parenchyma is clear without acute pulmonary parenchymal pathology.   2.  There is milder ectasia of the thoracic aorta related to mild   atherosclerotic vascular changes.        CT HEAD WO CONT   Final Result   IMPRESSION:    1.  This examination is negative for acute intracranial pathology.   2.  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   3.  There is apparent proptosis more pronounced on the right compared to left.                CT SPINE CERV WO CONT   Final Result   Impression:    1.  This examination is negative for an acute fracture or a subluxation injury   to the cervical spine.   2.  There is moderate degenerative disc disease at the C5-C6 level.   3.  There is right-sided facet arthropathy at the C2-C3 and the C3-C4 levels.   There is narrowing of the right neural foramen at C3-C4 level. There is   left-sided facet arthropathy primarily at the C2-C3 C4-C5 levels. There is   narrowing of the left C4-C5 neural foramen.                 CT Results  (Last 48 hours)    None          Assessment:     1.  A 69 year old African-American female with multiple medical problems including history of prior stroke and lung cancer who has been on chemotherapy, last chemotherapy in December 2020 , presented with unresponsiveness.      1.  Acute respiratory failure :  She was intubated enroute for airway protection.  Currently, she is off propofol.  She is minimally responsive, only opening eyes.  Apparently, she has a history of lung cancer.  Chest x-ray  showed underinflation    Currently on assist control as outlined above  ABGs and chest x-ray reviewed  Ventilator settings modified, FiO2 decreased to 24%  ABG results from yesterday were reviewed which showed 7.5 1/33/83/27/97 on 24% FiO2  No further changes on the ventilator support.    2.  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    Her eyes are open but does not give any meaningful response.  As discussed above,   Neurology is consulted.    Neurology noted that patient was posturing on right side.  CT scan of head with contrast was done which was unremarkable  Of the brain was done yesterday which showed ischemic changes in the brain    IMPRESSION: Restricted diffusion in the posterior temporal frontal parietal and   occipital regions consistent with hypoxic ischemic encephalopathy. Sparing of   the basal ganglia, thalamus and posterior fossa. Differential diagnoses includes   PRES (posterior reversible encephalopathy syndrome). Repeat MRI in a week's time   is recommended.     3.  History of lung cancer.    Patient has received last chemotherapy in December 2020, details are not available.    Chest x-ray, however, does not show any significant mass or nodule or other chronic changes.    4.  History of stroke.  5.  Diabetes mellitus.    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     For deep venous thrombosis prophylaxis, the patient is started on heparin subcutaneously, which will be continued.  For gastrointestinal stress prophylaxis, she is started on Protonix IV which will be continued.      Thank you for involving me in the care of this patient  I will follow with you closely during hospitalization    This document has been prepared by the Dragon voice recognition system, typographical errors may have occurred. Attempts have been made to correct errors, however inadvertent errors may persist.    Thompson Grayer, MD  [facility] ( [facility])  11/16/2019

## 2019-11-16 NOTE — Other (Signed)
11/16/2019    I met with patient's sister again, along with the oncology nurse navigator and ICU nurse Caryl Pina.  She and her 2 brothers were in the room earlier and one of them thought patient squeezed her hand.  I explained to patient's daughter that this was likely a type of palmar reflex or posturing reflex which in fact is a very bad prognostic sign.,  However, at this time the family agrees they all want to continue aggressive care for now and reevaluate on Saturday.  They want her to remain full code, continue antibiotics, etc.    I spoke with them regarding DNR,   and they want her to remain full code      Ludwig Clarks, MD

## 2019-11-16 NOTE — Progress Notes (Signed)
Pulmonology and Critical Care Progress Note    Subjective:       Patient seen and examined in ICU  Overnight events noted  Intubated on mechanical ventilation  Currently on assist control rate of 12, tidal volume 450, FiO2 30%, PEEP of 5  MRI of brain done yesterday which showed anoxic brain changes.    Had CT scan of head done with contrast yesterday which did not show any malignant spread to the brain   remains on propofol To keep her Heart rate in range.   Not responding to stimuli per the nurse when taken off sedation  Not requiring vasopressors at this time    Review of Systems:  Review of systems not obtained due to patient factors.    Current Facility-Administered Medications   Medication Dose Route Frequency Provider Last Rate Last Admin   ??? insulin glargine (LANTUS) injection 5 Units  5 Units SubCUTAneous QHS Kardar, Ahmed Marin Olp, MD   5 Units at 11/15/19 2106   ??? insulin lispro (HUMALOG) injection 3 Units  3 Units SubCUTAneous TIDPC Kardar, Ahmed Marin Olp, MD   3 Units at 11/16/19 0815   ??? aspirin delayed-release tablet 81 mg  81 mg Oral DAILY Colan Neptune, MD   81 mg at 11/16/19 0815   ??? atorvastatin (LIPITOR) tablet 40 mg  40 mg Oral DAILY Colan Neptune, MD   40 mg at 11/16/19 0815   ??? meropenem (MERREM) 1 g in sterile water (preservative free) 20 mL IV syringe  1 g IntraVENous Q12H Kardar, Ahmed Marin Olp, MD   1 g at 11/16/19 0815   ??? chlorhexidine (PERIDEX) 0.12 % mouthwash 15 mL  15 mL Oral Q12H Kardar, Ahmed Marin Olp, MD   15 mL at 11/16/19 0815   ??? levoFLOXacin (LEVAQUIN) 500 mg in D5W IVPB  500 mg IntraVENous Q48H Kardar, Ahmed Marin Olp, MD 100 mL/hr at 11/14/19 1629 500 mg at 11/14/19 1629   ??? albuterol-ipratropium (DUO-NEB) 2.5 MG-0.5 MG/3 ML  3 mL Nebulization Q6H PRN Glena Norfolk, MD       ??? propofol (DIPRIVAN) 10 mg/mL infusion  0-50 mcg/kg/min (Order-Specific) IntraVENous TITRATE Forde Radon, MD 8.2 mL/hr at 11/16/19 0436 15 mcg/kg/min at 11/16/19 0436   ??? glucose  chewable tablet 16 g  4 Tab Oral PRN Margie Ege, PA-C       ??? dextrose (D50W) injection syrg 12.5-25 g  25-50 mL IntraVENous PRN Bishop, Krysten, PA-C   25 g at 11/11/19 0133   ??? glucagon (GLUCAGEN) injection 1 mg  1 mg IntraMUSCular PRN Bishop, Krysten, PA-C       ??? insulin lispro (HUMALOG) injection   SubCUTAneous AC&HS Bishop, Tonye Pearson, PA-C   5 Units at 11/16/19 0814   ??? metoprolol (LOPRESSOR) injection 5 mg  5 mg IntraVENous Q6H PRN Bishop, Krysten, PA-C   5 mg at 11/12/19 1350   ??? sodium chloride (NS) flush 5-40 mL  5-40 mL IntraVENous Q8H Bishop, Krysten, PA-C   10 mL at 11/16/19 0600   ??? sodium chloride (NS) flush 5-40 mL  5-40 mL IntraVENous PRN Bishop, Krysten, PA-C   10 mL at 11/11/19 0134   ??? acetaminophen (TYLENOL) tablet 650 mg  650 mg Oral Q6H PRN Bishop, Krysten, PA-C        Or   ??? acetaminophen (TYLENOL) suppository 650 mg  650 mg Rectal Q6H PRN Margie Ege, PA-C       ??? polyethylene glycol (MIRALAX) packet 17 g  17  g Oral DAILY PRN Bishop, Krysten, PA-C       ??? ondansetron (ZOFRAN ODT) tablet 4 mg  4 mg Oral Q8H PRN Bishop, Krysten, PA-C        Or   ??? ondansetron (ZOFRAN) injection 4 mg  4 mg IntraVENous Q6H PRN Bishop, Krysten, PA-C   4 mg at 11/10/19 2346   ??? heparin (porcine) injection 5,000 Units  5,000 Units SubCUTAneous Q8H Bishop, Krysten, PA-C   5,000 Units at 11/16/19 0549            No Known Allergies        Objective:     Blood pressure 114/76, pulse 100, temperature 98.4 ??F (36.9 ??C), resp. rate 11, height 5' 0.98" (1.549 m), weight 68.8 kg (151 lb 10.8 oz), SpO2 100 %. Temp (24hrs), Avg:98.4 ??F (36.9 ??C), Min:97.8 ??F (36.6 ??C), Max:99.7 ??F (37.6 ??C)      Intake and Output:  Current Shift: 01/07 0701 - 01/07 1900  In: 195   Out: -   Last 3 Shifts: 01/05 1901 - 01/07 0700  In: 809   Out: 1497 [Urine:1600]    Physical Exam:    General:  Lying in bed, intubated on mechanical ventilation  Throat and Neck: Supple  Lung:  Patient has good air entry bilaterally.  No wheezing was  appreciated.   Heart: S1+S2.  No murmurs, tachycardia  Abdomen: soft, non-tender. Bowel sounds normal. No masses; obese  Extremities: No edema  GU: Not done  Skin: No cyanosis   Neurologic:  Sedated on mechanical ventilation  Psychiatric:  Unable to perform due to patient's condition    Lab/Data Review:  Recent Results (from the past 24 hour(s))   GLUCOSE, POC    Collection Time: 11/15/19 10:55 AM   Result Value Ref Range    Glucose (POC) 291 (H) 65 - 100 mg/dL    Performed by Jason Coop    GLUCOSE, POC    Collection Time: 11/15/19  2:45 PM   Result Value Ref Range    Glucose (POC) 178 (H) 65 - 100 mg/dL    Performed by Jason Coop    GLUCOSE, POC    Collection Time: 11/15/19  5:56 PM   Result Value Ref Range    Glucose (POC) 227 (H) 65 - 100 mg/dL    Performed by [place], POC    Collection Time: 11/15/19  8:12 PM   Result Value Ref Range    Glucose (POC) 188 (H) 65 - 100 mg/dL    Performed by [place], POC    Collection Time: 11/15/19 11:01 PM   Result Value Ref Range    Glucose (POC) 170 (H) 65 - 100 mg/dL    Performed by [place], POC    Collection Time: 11/16/19  3:01 AM   Result Value Ref Range    Glucose (POC) 154 (H) 65 - 100 mg/dL    Performed by [place], POC    Collection Time: 11/16/19  7:44 AM   Result Value Ref Range    Glucose (POC) 252 (H) 65 - 100 mg/dL    Performed by Cristina Gong      chest X-ray      MRI BRAIN W WO CONT   Final Result   IMPRESSION: Restricted diffusion in the posterior temporal frontal parietal and   occipital regions consistent with hypoxic ischemic encephalopathy. Sparing of  the basal ganglia, thalamus and posterior fossa. Differential diagnoses includes   PRES (posterior reversible encephalopathy syndrome). Repeat MRI in a week's time   is recommended.      Prominent microvascular ischemic changes as described above.      CT HEAD WO CONT   Final Result   IMPRESSION: Similar findings compared to CT head  11/12/2019.                  XR CHEST PORT   Final Result   Impression: Underexpanded lungs with bibasilar atelectasis.  No significant   interval change.         CTA HEAD   Final Result   IMPRESSION:   1.  No evidence of acute intracranial process on noncontrast CT head.   2.  No large vessel occlusion, hemodynamically significant luminal stenosis or   intracranial aneurysm.      XR CHEST PORT   Final Result   Impression:    No acute cardiopulmonary process.      NM LUNG SCAN PERF   Final Result   Impression:   Normal perfusion scintigraphy.         XR CHEST SNGL V   Final Result   IMPRESSION:   1.  The endotracheal tube tip overlies the tracheal airway approximately 37 mm   above the carina.   2.  The lungs are clear without acute pulmonary parenchymal pathology.        XR CHEST SNGL V   Final Result   IMPRESSION:   1.  The lung parenchyma is clear without acute pulmonary parenchymal pathology.   2.  There is milder ectasia of the thoracic aorta related to mild   atherosclerotic vascular changes.        CT HEAD WO CONT   Final Result   IMPRESSION:    1.  This examination is negative for acute intracranial pathology.   2.  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   3.  There is apparent proptosis more pronounced on the right compared to left.                CT SPINE CERV WO CONT   Final Result   Impression:    1.  This examination is negative for an acute fracture or a subluxation injury   to the cervical spine.   2.  There is moderate degenerative disc disease at the C5-C6 level.   3.  There is right-sided facet arthropathy at the C2-C3 and the C3-C4 levels.   There is narrowing of the right neural foramen at C3-C4 level. There is   left-sided facet arthropathy primarily at the C2-C3 C4-C5 levels. There is   narrowing of the left C4-C5 neural foramen.                CT Results  (Last 48 hours)    None          Assessment:     1.  A 69 year old  African-American female with multiple medical problems including history of prior stroke and lung cancer who has been on chemotherapy, last chemotherapy in December 2020 , presented with unresponsiveness.      1.  Acute respiratory failure :  She was intubated enroute for airway protection.  Currently, she is off propofol.  She is minimally responsive, only opening eyes.  Apparently, she has a history of lung cancer.  Chest x-ray  showed underinflation    Currently on assist control as outlined above  ABGs and chest x-ray reviewed  Ventilator settings modified, FiO2 decreased to 24%  ABG results from yesterday were reviewed which showed 7.5 1/33/83/27/97 on 24% FiO2  No further changes on the ventilator support.    2.  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    Her eyes are open but does not give any meaningful response.  As discussed above,   Neurology is consulted.    Neurology noted that patient was posturing on right side.  CT scan of head with contrast was done which was unremarkable  Of the brain was done yesterday which showed ischemic changes in the brain    IMPRESSION: Restricted diffusion in the posterior temporal frontal parietal and   occipital regions consistent with hypoxic ischemic encephalopathy. Sparing of   the basal ganglia, thalamus and posterior fossa. Differential diagnoses includes   PRES (posterior reversible encephalopathy syndrome). Repeat MRI in a week's time   is recommended.     3.  History of lung cancer.    Patient has received last chemotherapy in December 2020, details are not available.    Chest x-ray, however, does not show any significant mass or nodule or other chronic changes.    4.  History of stroke.  5.  Diabetes mellitus.    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    For deep venous thrombosis prophylaxis, the patient is started on heparin subcutaneously, which will be  continued.  For gastrointestinal stress prophylaxis, she is started on Protonix IV which will be continued.      Thank you for involving me in the care of this patient  I will follow with you closely during hospitalization    This document has been prepared by the Dragon voice recognition system, typographical errors may have occurred. Attempts have been made to correct errors, however inadvertent errors may persist.    Thompson Grayer, MD  [facility] ( [facility])  11/16/2019

## 2019-11-16 NOTE — Progress Notes (Signed)
NEUROLOGY  PROGRESS NOTE    Admission History/Pertinent Events  Kristin Cooper is a 69 y.o. year old female who presented on 11/10/2019. Patient has a past medical history of Lung Cancer, HTN, DM, HL who initially presented after being found unresponsive by family.  Per chart review, patient had several episodes of unresponsiveness on the week prior to presentation.  Per daughter's report.  Patient reportedly has behavioral arrest and then has staring episodes with decreased responsiveness.  On day of presentation, patient had a similar episode but then had vomiting thereafter.  Patient became more responsive than usual and family members reportedly started CPR.  EMS came to find patient had a good pulse but intubated patient and gave her naloxone due to concerns for airway protection.  Patient's glucose is initially was found to be 53. Emergent CT head was negative for any acute findings.  Chest x-ray concerning for possible aspiration pneumonia for which patient was started on IV antibiotics.    Home Stroke Prophylaxis: Atorvastatin 40      ASSESSMENT/PLAN      Impression  No significant changes on patient's neurological examination.  Patient's prognosis remains poor based on neurological examination and findings on neuroimaging.      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  [facility]    CTH WO (11/12/18 d/t new RUE extensor posturing)  No interval changes from prior study    EEG  Moderate to severe generalized encephalopathy without evidence of cortical irritability    MRI Brain WWO  Restricted diffusion in the bilateral frontal, parietal, temporal and occipital cortical regions most concerning for hypoxic ischemic injury      Plan      1) Encephalopathy  Associated: Hypoxic Brain Injury, Lung Cancer, Hypoglycemia, PNA  -Q4NeuroChecks, TELE  -Seizure Precautions  -No AEDs at the current time  -STAT IV lorazepam 2 mg with any clinical seizure activity lasting greater  than 3 minutes and contact Neurology for further recommendations  -Management of infectious/metabolic derangements to referring teams    2) Remote Basal Gangliar Infarcts  -Stroke Prophylaxis: ASA 81, Atorvastatin 40  -SBP Goal < 160        SUBJECTIVE   Patient on propofol drip, meropenem and levefloxacin.  No significant changes on neurological examination.      Physical/Neurological Exam  [name],  Cardiovascular: normal rate and rhythm  Pulmonary: CTAB    Patient intubated  Patient does not follow any central or peripheral commands  Does not attempt to speak  Disconjugate gaze  Pupils react to light bilaterally  Oculocephalics intact  Corneal reflex intact bilaterally  No BTT bilaterally  No facial droop  Cough and gag are intact  Tongue is midline under ETT  Motor   LUE: 3/5   RUE: extensor posturing   BLE: 1/5 to pain  No abnormal movements  Normal tone throughout      OBJECTIVE  Vital Signs  Temp:  [97.3 ??F (36.3 ??C)-98.7 ??F (37.1 ??C)]   Pulse (Heart Rate):  [85-115]   BP: (72-167)/(60-109)   Resp Rate:  [12-16]   O2 Sat (%):  [95 %-100 %]   Weight:  [65.6 kg (144 lb 10 oz)-68.8 kg (151 lb 10.8 oz)]     MEDICATIONS    Current Facility-Administered Medications:   ???  insulin glargine (LANTUS) injection 5 Units, 5 Units, SubCUTAneous, QHS, Kardar, Ahmed Mertie Moores, MD, 5 Units at 11/14/19 2219  ???  insulin  lispro (HUMALOG) injection 3 Units, 3 Units, SubCUTAneous, TIDPC, Kardar, Ahmed Marin Olp, MD, 3 Units at 11/14/19 1730  ???  aspirin delayed-release tablet 81 mg, 81 mg, Oral, DAILY, Colan Neptune, MD, 81 mg at 11/14/19 7106  ???  atorvastatin (LIPITOR) tablet 40 mg, 40 mg, Oral, DAILY, Colan Neptune, MD, 40 mg at 11/14/19 0943  ???  meropenem (MERREM) 1 g in sterile water (preservative free) 20 mL IV syringe, 1 g, IntraVENous, Q12H, Kardar, Ahmed Marin Olp, MD, 1 g at 11/14/19 2219  ???  chlorhexidine (PERIDEX) 0.12 % mouthwash 15 mL, 15 mL, Oral, Q12H, Kardar, Ahmed Marin Olp, MD, 15  mL at 11/14/19 2330  ???  levoFLOXacin (LEVAQUIN) 500 mg in D5W IVPB, 500 mg, IntraVENous, Q48H, Kardar, Ahmed Marin Olp, MD, Last Rate: 100 mL/hr at 11/14/19 1629, 500 mg at 11/14/19 1629  ???  albuterol-ipratropium (DUO-NEB) 2.5 MG-0.5 MG/3 ML, 3 mL, Nebulization, Q6H PRN, Mughal, Zahid, MD  ???  propofol (DIPRIVAN) 10 mg/mL infusion, 0-50 mcg/kg/min (Order-Specific), IntraVENous, TITRATE, Pettis, Geroge Baseman, MD, Last Rate: 10.9 mL/hr at 11/15/19 0537, 20 mcg/kg/min at 11/15/19 0537  ???  glucose chewable tablet 16 g, 4 Tab, Oral, PRN, Bishop, Krysten, PA-C  ???  dextrose (D50W) injection syrg 12.5-25 g, 25-50 mL, IntraVENous, PRN, Bishop, Krysten, PA-C, 25 g at 11/11/19 0133  ???  glucagon (GLUCAGEN) injection 1 mg, 1 mg, IntraMUSCular, PRN, Bishop, Krysten, PA-C  ???  insulin lispro (HUMALOG) injection, , SubCUTAneous, AC&HS, Bishop, Krysten, PA-C, 4 Units at 11/14/19 2330  ???  metoprolol (LOPRESSOR) injection 5 mg, 5 mg, IntraVENous, Q6H PRN, Bishop, Krysten, PA-C, 5 mg at 11/12/19 1350  ???  sodium chloride (NS) flush 5-40 mL, 5-40 mL, IntraVENous, Q8H, Bishop, Krysten, PA-C, 10 mL at 11/15/19 0600  ???  sodium chloride (NS) flush 5-40 mL, 5-40 mL, IntraVENous, PRN, Bishop, Krysten, PA-C, 10 mL at 11/11/19 0134  ???  acetaminophen (TYLENOL) tablet 650 mg, 650 mg, Oral, Q6H PRN **OR** acetaminophen (TYLENOL) suppository 650 mg, 650 mg, Rectal, Q6H PRN, Bishop, Krysten, PA-C  ???  polyethylene glycol (MIRALAX) packet 17 g, 17 g, Oral, DAILY PRN, Bishop, Krysten, PA-C  ???  ondansetron (ZOFRAN ODT) tablet 4 mg, 4 mg, Oral, Q8H PRN **OR** ondansetron (ZOFRAN) injection 4 mg, 4 mg, IntraVENous, Q6H PRN, Bishop, Krysten, PA-C, 4 mg at 11/10/19 2346  ???  heparin (porcine) injection 5,000 Units, 5,000 Units, SubCUTAneous, Q8H, Bishop, Krysten, PA-C, 5,000 Units at 11/15/19 2694      Labs: I've reviewed the labs for today     This document has been prepared by the Dragon voice recognition system, typographical errors may have occurred.  Attempts have been made to correct errors, however inadvertent errors may persist.

## 2019-11-16 NOTE — Progress Notes (Signed)
Bedside shift report given to Leonette Monarch  RN

## 2019-11-16 NOTE — Progress Notes (Signed)
Progress Notes by Lieutenant DiegoFrelier, Calirose Mccance R, MD at 11/16/19 [phone]                Author: Lieutenant DiegoFrelier, Neils Siracusa R, MD  Service: Internal Medicine  Author Type: Physician       Filed: 11/16/19 0754  Date of Service: 11/16/19 0752  Status: Signed          Editor: Lieutenant DiegoFrelier, Breton Berns R, MD (Physician)                              Hospitalist Progress Note                   Daily Progress Note: 11/16/2019           Subjective:     The patient is seen for follow up.   Condition is unchanged.  She remains unresponsive.  She is back on a low-dose propofol drip to control coughing reflex and tachycardia.  Daughter  told nursing staff yesterday that the children are planning on making her comfort care today      Problem List:      Problem List  as of 11/16/2019  Never Reviewed                        Codes  Class  Noted - Resolved             AMS (altered mental status)  ICD-10-CM: R41.82   ICD-9-CM: 780.97    11/10/2019 - Present                          Medications reviewed     Current Facility-Administered Medications          Medication  Dose  Route  Frequency           ?  insulin glargine (LANTUS) injection 5 Units   5 Units  SubCUTAneous  QHS     ?  insulin lispro (HUMALOG) injection 3 Units   3 Units  SubCUTAneous  TIDPC     ?  aspirin delayed-release tablet 81 mg   81 mg  Oral  DAILY     ?  atorvastatin (LIPITOR) tablet 40 mg   40 mg  Oral  DAILY     ?  meropenem (MERREM) 1 g in sterile water (preservative free) 20 mL IV syringe   1 g  IntraVENous  Q12H           ?  chlorhexidine (PERIDEX) 0.12 % mouthwash 15 mL   15 mL  Oral  Q12H           ?  levoFLOXacin (LEVAQUIN) 500 mg in D5W IVPB   500 mg  IntraVENous  Q48H     ?  albuterol-ipratropium (DUO-NEB) 2.5 MG-0.5 MG/3 ML   3 mL  Nebulization  Q6H PRN     ?  propofol (DIPRIVAN) 10 mg/mL infusion   0-50 mcg/kg/min (Order-Specific)  IntraVENous  TITRATE     ?  glucose chewable tablet 16 g   4 Tab  Oral  PRN     ?  dextrose (D50W) injection syrg 12.5-25 g   25-50 mL  IntraVENous  PRN     ?   glucagon (GLUCAGEN) injection 1 mg   1 mg  IntraMUSCular  PRN     ?  insulin lispro (HUMALOG) injection     SubCUTAneous  AC&HS     ?  metoprolol (LOPRESSOR) injection 5 mg   5 mg  IntraVENous  Q6H PRN     ?  sodium chloride (NS) flush 5-40 mL   5-40 mL  IntraVENous  Q8H     ?  sodium chloride (NS) flush 5-40 mL   5-40 mL  IntraVENous  PRN     ?  acetaminophen (TYLENOL) tablet 650 mg   650 mg  Oral  Q6H PRN          Or           ?  acetaminophen (TYLENOL) suppository 650 mg   650 mg  Rectal  Q6H PRN     ?  polyethylene glycol (MIRALAX) packet 17 g   17 g  Oral  DAILY PRN     ?  ondansetron (ZOFRAN ODT) tablet 4 mg   4 mg  Oral  Q8H PRN          Or           ?  ondansetron (ZOFRAN) injection 4 mg   4 mg  IntraVENous  Q6H PRN           ?  heparin (porcine) injection 5,000 Units   5,000 Units  SubCUTAneous  Q8H             Review of Systems:     Review of systems not obtained due to patient factors.        Objective:     Physical Exam:       Visit Vitals      BP  114/76 (BP 1 Location: Left arm, BP Patient Position: At rest)     Pulse  100     Temp  98.2 ??F (36.8 ??C)     Resp  11     Ht  5' 0.98" (1.549 m)     Wt  68.8 kg (151 lb 10.8 oz)     SpO2  100%        BMI  28.67 kg/m??        O2 Device:  Ventilator      Temp (24hrs), Avg:98.4 ??F (36.9 ??C), Min:97.8 ??F (36.6 ??C), Max:99.7 ??F (37.6 ??C)     No intake/output data recorded.    01/05 1901 - 01/07 0700   In: 809    Out: 1601 [Urine:1600]         General:    Intubated, unresponsive        Lungs:    Clear to auscultation bilaterally.        Chest wall:   No tenderness or deformity.        Heart:   Regular rate and rhythm, S1, S2 normal, no murmur, click, rub or gallop.        Abdomen:    Soft, non-tender. Bowel sounds normal. No masses,  No organomegaly.     Extremities:  Extremities normal, atraumatic, no cyanosis or edema.     Pulses:  2+ and symmetric all extremities.     Skin:  Skin color, texture, turgor normal. No rashes or lesions        Neurologic:  CNII-XII  intact.  Normal oculocephalic reflex.  No response to painful stimuli.  No withdrawal to pain                Data Review:          Recent Days:   No results for input(s): WBC, HGB, HCT, PLT,  HGBEXT, HCTEXT, PLTEXT, HGBEXT, HCTEXT, PLTEXT in the last 72 hours.     Recent Labs           11/14/19   0515     NA  138     K  4.3     CL  107     CO2  25     GLU  257*     BUN  16     CREA  1.09*        CA  8.7          Recent Labs           11/14/19   0515     PH  7.51*     PCO2  33*     PO2  83     HCO3  27*        FIO2  24.0           24 Hour Results:     Recent Results (from the past 24 hour(s))     GLUCOSE, POC          Collection Time: 11/15/19  7:54 AM         Result  Value  Ref Range            Glucose (POC)  249 (H)  65 - 100 mg/dL       Performed by  Ahing Asha         GLUCOSE, POC          Collection Time: 11/15/19 10:55 AM         Result  Value  Ref Range            Glucose (POC)  291 (H)  65 - 100 mg/dL       Performed by  Ahing Asha         GLUCOSE, POC          Collection Time: 11/15/19  2:45 PM         Result  Value  Ref Range            Glucose (POC)  178 (H)  65 - 100 mg/dL       Performed by  Ahing Asha         GLUCOSE, POC          Collection Time: 11/15/19  5:56 PM         Result  Value  Ref Range            Glucose (POC)  227 (H)  65 - 100 mg/dL       Performed by  [place], POC          Collection Time: 11/15/19  8:12 PM         Result  Value  Ref Range            Glucose (POC)  188 (H)  65 - 100 mg/dL       Performed by  [place], POC          Collection Time: 11/15/19 11:01 PM         Result  Value  Ref Range            Glucose (POC)  170 (H)  65 - 100 mg/dL       Performed by  Hulen Shouts EMILY  GLUCOSE, POC          Collection Time: 11/16/19  3:01 AM         Result  Value  Ref Range            Glucose (POC)  154 (H)  65 - 100 mg/dL       Performed by  Charlotte Sanes         GLUCOSE, POC          Collection Time: 11/16/19  7:44 AM         Result  Value   Ref Range            Glucose (POC)  252 (H)  65 - 100 mg/dL            Performed by  Leane Call               MRI BRAIN W WO CONT       Final Result     IMPRESSION: Restricted diffusion in the posterior temporal frontal parietal and     occipital regions consistent with hypoxic ischemic encephalopathy. Sparing of     the basal ganglia, thalamus and posterior fossa. Differential diagnoses includes     PRES (posterior reversible encephalopathy syndrome). Repeat MRI in a week's time     is recommended.          Prominent microvascular ischemic changes as described above.            CT HEAD WO CONT       Final Result     IMPRESSION: Similar findings compared to CT head 11/12/2019.                                XR CHEST PORT       Final Result     Impression: Underexpanded lungs with bibasilar atelectasis.  No significant     interval change.                 CTA HEAD       Final Result     IMPRESSION:     1.  No evidence of acute intracranial process on noncontrast CT head.     2.  No large vessel occlusion, hemodynamically significant luminal stenosis or     intracranial aneurysm.            XR CHEST PORT       Final Result     Impression:      No acute cardiopulmonary process.            NM LUNG SCAN PERF       Final Result     Impression:     Normal perfusion scintigraphy.                 XR CHEST SNGL V       Final Result     IMPRESSION:     1.  The endotracheal tube tip overlies the tracheal airway approximately 37 mm     above the carina.     2.  The lungs are clear without acute pulmonary parenchymal pathology.              XR CHEST SNGL V       Final Result     IMPRESSION:     1.  The lung parenchyma is clear without acute pulmonary parenchymal pathology.  2.  There is milder ectasia of the thoracic aorta related to mild     atherosclerotic vascular changes.              CT HEAD WO CONT       Final Result     IMPRESSION:      1.  This examination is negative for acute intracranial pathology.     2.  There  are milder microvascular changes within the central white matter and     old basal ganglia lacunar infarcts.     3.  There is apparent proptosis more pronounced on the right compared to left.                            CT SPINE CERV WO CONT       Final Result     Impression:      1.  This examination is negative for an acute fracture or a subluxation injury     to the cervical spine.     2.  There is moderate degenerative disc disease at the C5-C6 level.     3.  There is right-sided facet arthropathy at the C2-C3 and the C3-C4 levels.     There is narrowing of the right neural foramen at C3-C4 level. There is     left-sided facet arthropathy primarily at the C2-C3 C4-C5 levels. There is     narrowing of the left C4-C5 neural foramen.                             Assessment:   Severe encephalopathy, anoxic encephalopathy.  Suspect she had a cardiac arrest event at home with ROSC      Acute respiratory failure with hypoxia, requiring mechanical ventilation      Aspiration pneumonia      History of lung cancer, details unknown      Diabetes mellitus type 2      History of stroke         Plan:   Continue mechanical ventilation and supportive care   Likely transition to comfort care today         Care Plan discussed with: Nurse      Total time spent with patient: 30 minutes.      Lieutenant Diego, MD

## 2019-11-16 NOTE — Progress Notes (Signed)
11/16/2019    I met with patient's sister again, along with the oncology nurse navigator and ICU nurse Ashley.  She and her 2 brothers were in the room earlier and one of them thought patient squeezed her hand.  I explained to patient's daughter that this was likely a type of palmar reflex or posturing reflex which in fact is a very bad prognostic sign.,  However, at this time the family agrees they all want to continue aggressive care for now and reevaluate on Saturday.  They want her to remain full code, continue antibiotics, etc.    I spoke with them regarding DNR,   and they want her to remain full code      Gabryella Murfin R Naithan Delage, MD

## 2019-11-16 NOTE — Care Coordination-Inpatient (Signed)
Received request to speak with family from primary nurse to help with difficult decision regarding comfort care vs aggressive care. Met with daughter along with Dr. Roselyn Reef and nurse Morrie Sheldon. Daughter stated that her brothers felt that the patient squeezed his hand in response to him. Dr. Roselyn Reef explained that this is likely a reflex just as it is when she opens her eyes. Provided emotional support; daughter understands prognosis but until her brothers are in agreement, she wishes to continue full code status and current level of care. No comfort code at this time. Brothers are due to visit again Saturday.  Asked if I could visit tomorrow, daughter agreed. Will continue to monitor.

## 2019-11-17 ENCOUNTER — Other Ambulatory Visit: Payer: Self-pay | Admitting: *Deleted

## 2019-11-17 LAB — GLUCOSE, POC
Glucose (POC): 220 mg/dL — ABNORMAL HIGH (ref 65–100)
Glucose (POC): 202 mg/dL — ABNORMAL HIGH (ref 65–100)
Glucose (POC): 242 mg/dL — ABNORMAL HIGH (ref 65–100)
Glucose (POC): 179 mg/dL — ABNORMAL HIGH (ref 65–100)
Glucose (POC): 323 mg/dL — ABNORMAL HIGH (ref 65–100)
Glucose (POC): 237 mg/dL — ABNORMAL HIGH (ref 65–100)

## 2019-11-17 LAB — CULTURE, BLOOD, PAIRED
Culture result:: NO GROWTH
Culture: NO GROWTH

## 2019-11-17 LAB — EKG, 12 LEAD, SUBSEQUENT
Atrial Rate: 72 {beats}/min
Calculated P Axis: 28 degrees
Calculated R Axis: 48 degrees
Calculated T Axis: 93 degrees
Diagnosis: NORMAL
P-R Interval: 196 ms
Q-T Interval: 420 ms
QRS Duration: 84 ms
QTC Calculation (Bezet): 459 ms
Ventricular Rate: 72 {beats}/min

## 2019-11-17 LAB — EKG 12-LEAD
Atrial Rate: 72 {beats}/min
Diagnosis: NORMAL
P Axis: 28 degrees
P-R Interval: 196 ms
Q-T Interval: 420 ms
QRS Duration: 84 ms
QTc Calculation (Bazett): 459 ms
R Axis: 48 degrees
T Axis: 93 degrees
Ventricular Rate: 72 {beats}/min

## 2019-11-17 LAB — POCT GLUCOSE
POC Glucose: 220 mg/dL — ABNORMAL HIGH (ref 65–100)
POC Glucose: 202 mg/dL — ABNORMAL HIGH (ref 65–100)
POC Glucose: 242 mg/dL — ABNORMAL HIGH (ref 65–100)
POC Glucose: 179 mg/dL — ABNORMAL HIGH (ref 65–100)
POC Glucose: 323 mg/dL — ABNORMAL HIGH (ref 65–100)
POC Glucose: 237 mg/dL — ABNORMAL HIGH (ref 65–100)

## 2019-11-17 MED ORDER — NOREPINEPHRINE 8 MG/250 ML (32 MCG/ML) D5W INFUSION
8 mg/250 mL (32 mcg/mL) | INTRAVENOUS | Status: AC
Start: 2019-11-17 — End: 2019-11-17
  Administered 2019-11-17: 14:00:00 via INTRAVENOUS

## 2019-11-17 MED ORDER — NOREPINEPHRINE 8 MG/250 ML (32 MCG/ML) D5W INFUSION
8 mg/250 mL (32 mcg/mL) | INTRAVENOUS | Status: DC
Start: 2019-11-17 — End: 2019-11-20

## 2019-11-17 MED FILL — HUMALOG U-100 INSULIN 100 UNIT/ML SUBCUTANEOUS SOLUTION: 100 unit/mL | SUBCUTANEOUS | Qty: 6

## 2019-11-17 MED FILL — GLYCOPYRROLATE 0.2 MG/ML IJ SOLN: 0.2 mg/mL | INTRAMUSCULAR | Qty: 2

## 2019-11-17 MED FILL — DIPRIVAN 10 MG/ML INTRAVENOUS EMULSION: 10 mg/mL | INTRAVENOUS | Qty: 100

## 2019-11-17 MED FILL — MEROPENEM 1 GRAM IV SOLR: 1 gram | INTRAVENOUS | Qty: 1

## 2019-11-17 MED FILL — NOREPINEPHRINE 8 MG/250 ML (32 MCG/ML) D5W INFUSION: 8 mg/250 mL (32 mcg/mL) | INTRAVENOUS | Qty: 250

## 2019-11-17 MED FILL — LANTUS U-100 INSULIN 100 UNIT/ML SUBCUTANEOUS SOLUTION: 100 unit/mL | SUBCUTANEOUS | Qty: 5

## 2019-11-17 MED FILL — CHLORHEXIDINE GLUCONATE 0.12 % MOUTHWASH: 0.12 % | Qty: 15

## 2019-11-17 MED FILL — HUMALOG U-100 INSULIN 100 UNIT/ML SUBCUTANEOUS SOLUTION: 100 unit/mL | SUBCUTANEOUS | Qty: 12

## 2019-11-17 NOTE — Patient Outreach (Addendum)
  [facility]) Care Management Chronic Special Needs Program    11/17/2019  Name: Tara Hopkins, DOB: 02-21-1951  MRN: [id number]  Case Closure Note  Ms. Tara Hopkins was enrolled in a chronic special needs plan for Diabetes. RNCM received notification that client has dis-enrolled from the [employer] plan, therefore RNCM is no longer able to follow as client's CSNP Chronic Care Management Coordinator.  Plan: Case closed as client has disenrolled from the [place] (CSNP) Send case closure letter to primary care provider. [employer] will notify client of dis-enrollment from plan.  Kelli Churn RN, CCM, [place] Management Coordinator [facility] Care Management [phone]

## 2019-11-17 NOTE — Progress Notes (Signed)
Bedside shift report given to Cora Marsign, RN.

## 2019-11-17 NOTE — Progress Notes (Signed)
Chart reviewed. Patient remains on the ventilator. Patient is now chemical code only. Patient's son will be coming to the hospital tomorrow prior to making any final decisions. CM will continue to follow.

## 2019-11-17 NOTE — Progress Notes (Signed)
NEUROLOGY  PROGRESS NOTE    Admission History/Pertinent Events  Kristin Cooper is a 69 y.o. year old female who presented on 11/10/2019. Patient has a past medical history of Lung Cancer, HTN, DM, HL who initially presented after being found unresponsive by family.  Per chart review, patient had several episodes of unresponsiveness on the week prior to presentation.  Per daughter's report.  Patient reportedly has behavioral arrest and then has staring episodes with decreased responsiveness.  On day of presentation, patient had a similar episode but then had vomiting thereafter.  Patient became more responsive than usual and family members reportedly started CPR.  EMS came to find patient had a good pulse but intubated patient and gave her naloxone due to concerns for airway protection.  Patient's glucose is initially was found to be 53. Emergent CT head was negative for any acute findings.  Chest x-ray concerning for possible aspiration pneumonia for which patient was started on IV antibiotics.    Home Stroke Prophylaxis: Atorvastatin 40      ASSESSMENT/PLAN      Impression  Per chart review looks like patient's family will give patient a little bit more time before making further medical decisions in regards to possible comfort care.  No significant changes on patient's neurological examination.  Patient's prognosis remains poor.      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    CTH WO (11/12/18 d/t new RUE extensor posturing)  No interval changes from prior study    EEG  Moderate to severe generalized encephalopathy without evidence of cortical irritability    MRI Brain WWO  Restricted diffusion in the bilateral frontal, parietal, temporal and occipital cortical regions most concerning for hypoxic ischemic injury      Plan      1) Encephalopathy  Associated: Hypoxic Brain Injury, Lung Cancer, Hypoglycemia, PNA  -Q4NeuroChecks, TELE  -Seizure Precautions   -No AEDs at the current time  -STAT IV lorazepam 2 mg with any clinical seizure activity lasting greater than 3 minutes and contact Neurology for further recommendations  -Management of infectious/metabolic derangements to referring teams    2) Remote Basal Gangliar Infarcts  -Stroke Prophylaxis: ASA 81, Atorvastatin 40  -SBP Goal < 160        SUBJECTIVE   Patient currently on a propofol drip and meropenem.  No significant changes on neurological examination.       Physical/Neurological Exam  [name],  Cardiovascular: normal rate and rhythm  Pulmonary: CTAB    Patient intubated  Patient does not follow any central or peripheral commands  Does not attempt to speak  Disconjugate gaze  Pupils react to light bilaterally  Oculocephalics intact  Corneal reflex intact bilaterally  No BTT bilaterally  No facial droop  Cough and gag are intact  Tongue is midline under ETT  Motor   LUE: 3/5   RUE: extensor posturing   BLE: 1/5 to pain  No abnormal movements  Normal tone throughout      OBJECTIVE  Vital Signs  Temp:  [97.4 ??F (36.3 ??C)-99.7 ??F (37.6 ??C)]   Pulse (Heart Rate):  [91-115]   BP: (72-161)/(60-97)   Resp Rate:  [11-16]   O2 Sat (%):  [95 %-100 %]   Weight:  [65.7 kg (144 lb 13.5 oz)-68.8 kg (151 lb 10.8 oz)]     MEDICATIONS    Current Facility-Administered Medications:   ???  insulin glargine (LANTUS)  injection 5 Units, 5 Units, SubCUTAneous, QHS, Kardar, Ahmed Mertie Moores, MD, 5 Units at 11/15/19 2106  ???  insulin lispro (HUMALOG) injection 3 Units, 3 Units, SubCUTAneous, TIDPC, Kardar, Ahmed Mertie Moores, MD, 3 Units at 11/16/19 0815  ???  aspirin delayed-release tablet 81 mg, 81 mg, Oral, DAILY, Linward Natal, MD, 81 mg at 11/16/19 0815  ???  atorvastatin (LIPITOR) tablet 40 mg, 40 mg, Oral, DAILY, Linward Natal, MD, 40 mg at 11/16/19 0815  ???  meropenem (MERREM) 1 g in sterile water (preservative free) 20 mL IV syringe, 1 g, IntraVENous, Q12H, Kardar, Ahmed Mertie Moores, MD, 1 g at 11/16/19 0815   ???  chlorhexidine (PERIDEX) 0.12 % mouthwash 15 mL, 15 mL, Oral, Q12H, Kardar, Ahmed Mertie Moores, MD, 15 mL at 11/16/19 0815  ???  levoFLOXacin (LEVAQUIN) 500 mg in D5W IVPB, 500 mg, IntraVENous, Q48H, Kardar, Ahmed Mertie Moores, MD, Last Rate: 100 mL/hr at 11/14/19 1629, 500 mg at 11/14/19 1629  ???  albuterol-ipratropium (DUO-NEB) 2.5 MG-0.5 MG/3 ML, 3 mL, Nebulization, Q6H PRN, Mughal, Zahid, MD  ???  propofol (DIPRIVAN) 10 mg/mL infusion, 0-50 mcg/kg/min (Order-Specific), IntraVENous, TITRATE, Pettis, Marsh Dolly, MD, Last Rate: 8.2 mL/hr at 11/16/19 0436, 15 mcg/kg/min at 11/16/19 0436  ???  glucose chewable tablet 16 g, 4 Tab, Oral, PRN, Bishop, Krysten, PA-C  ???  dextrose (D50W) injection syrg 12.5-25 g, 25-50 mL, IntraVENous, PRN, Bishop, Krysten, PA-C, 25 g at 11/11/19 0133  ???  glucagon (GLUCAGEN) injection 1 mg, 1 mg, IntraMUSCular, PRN, Bishop, Krysten, PA-C  ???  insulin lispro (HUMALOG) injection, , SubCUTAneous, AC&HS, Bishop, Krysten, PA-C, 5 Units at 11/16/19 0814  ???  metoprolol (LOPRESSOR) injection 5 mg, 5 mg, IntraVENous, Q6H PRN, Bishop, Krysten, PA-C, 5 mg at 11/12/19 1350  ???  sodium chloride (NS) flush 5-40 mL, 5-40 mL, IntraVENous, Q8H, Bishop, Krysten, PA-C, 10 mL at 11/16/19 0600  ???  sodium chloride (NS) flush 5-40 mL, 5-40 mL, IntraVENous, PRN, Bishop, Krysten, PA-C, 10 mL at 11/11/19 0134  ???  acetaminophen (TYLENOL) tablet 650 mg, 650 mg, Oral, Q6H PRN **OR** acetaminophen (TYLENOL) suppository 650 mg, 650 mg, Rectal, Q6H PRN, Bishop, Krysten, PA-C  ???  polyethylene glycol (MIRALAX) packet 17 g, 17 g, Oral, DAILY PRN, Bishop, Krysten, PA-C  ???  ondansetron (ZOFRAN ODT) tablet 4 mg, 4 mg, Oral, Q8H PRN **OR** ondansetron (ZOFRAN) injection 4 mg, 4 mg, IntraVENous, Q6H PRN, Bishop, Krysten, PA-C, 4 mg at 11/10/19 2346  ???  heparin (porcine) injection 5,000 Units, 5,000 Units, SubCUTAneous, Q8H, Bishop, Krysten, PA-C, 5,000 Units at 11/16/19 3664      Labs: I've reviewed the labs for today      This document has been prepared by the Dragon voice recognition system, typographical errors may have occurred. Attempts have been made to correct errors, however inadvertent errors may persist.

## 2019-11-17 NOTE — Progress Notes (Signed)
Bedside and Verbal shift change report given to E-Beth Turner RN (oncoming nurse) by Cora Marasigan RN (offgoing nurse). Report included the following information SBAR.

## 2019-11-17 NOTE — Progress Notes (Signed)
At bedside with pt,  Systolic pressures dropped from 110's to 50's. Levophed started to increase perfusion. Heart rhythm change from low 100's to 60's. PACs and sinus arrhythmias present with ST elevation. Stat EKG ordered.     Daughter at bedside during episode. Decision made to make pt a chemical code only. Dr. Freiler notified for order.     Levophed titrated and BP 111/78. Heart rate up to 80. Will continue to monitor very closely.

## 2019-11-17 NOTE — Progress Notes (Signed)
Hospitalist Progress Note               Daily Progress Note: 11/17/2019      Subjective:   The patient is seen for follow up.   Condition is unchanged.   Patient remains unresponsive.  Blood pressure starting to trend down.  Her daughter is at the bedside, asking to speak with the neurologist    Problem List:  Problem List as of 11/17/2019 Never Reviewed          Codes Class Noted - Resolved    AMS (altered mental status) ICD-10-CM: R41.82  ICD-9-CM: 780.97  11/10/2019 - Present              Medications reviewed  Current Facility-Administered Medications   Medication Dose Route Frequency   ??? glycopyrrolate (ROBINUL) injection 0.1 mg  0.1 mg IntraVENous TID PRN   ??? insulin glargine (LANTUS) injection 5 Units  5 Units SubCUTAneous QHS   ??? meropenem (MERREM) 1 g in sterile water (preservative free) 20 mL IV syringe  1 g IntraVENous Q12H   ??? metoprolol (LOPRESSOR) injection 5 mg  5 mg IntraVENous Q6H PRN   ??? propofol (DIPRIVAN) 10 mg/mL infusion  0-50 mcg/kg/min IntraVENous TITRATE   ??? insulin lispro (HUMALOG) injection   SubCUTAneous Q6H   ??? glucose chewable tablet 16 g  4 Tab Oral PRN   ??? glucagon (GLUCAGEN) injection 1 mg  1 mg IntraMUSCular PRN   ??? dextrose (D50W) injection syrg 12.5-25 g  25-50 mL IntraVENous PRN   ??? chlorhexidine (PERIDEX) 0.12 % mouthwash 15 mL  15 mL Oral Q12H   ??? sodium chloride (NS) flush 5-40 mL  5-40 mL IntraVENous Q8H   ??? sodium chloride (NS) flush 5-40 mL  5-40 mL IntraVENous PRN   ??? acetaminophen (TYLENOL) tablet 650 mg  650 mg Oral Q6H PRN    Or   ??? acetaminophen (TYLENOL) suppository 650 mg  650 mg Rectal Q6H PRN   ??? ondansetron (ZOFRAN ODT) tablet 4 mg  4 mg Oral Q8H PRN    Or   ??? ondansetron (ZOFRAN) injection 4 mg  4 mg IntraVENous Q6H PRN       Review of Systems:   Review of systems not obtained due to patient factors.    Objective:   Physical Exam:     Visit Vitals  BP 116/65 (BP 1 Location: Left arm, BP Patient Position: At rest;Head of bed elevated (Comment degrees))    Pulse (!) 102   Temp 99.1 ??F (37.3 ??C)   Resp 12   Ht 5' 0.98" (1.549 m)   Wt 70.7 kg (155 lb 13.8 oz)   SpO2 99%   BMI 29.47 kg/m??      O2 Device: Ventilator    Temp (24hrs), Avg:98.4 ??F (36.9 ??C), Min:98 ??F (36.7 ??C), Max:99.1 ??F (37.3 ??C)    No intake/output data recorded.   01/06 1901 - 01/08 0700  In: 2419.2 [I.V.:69.2]  Out: 1626 [Urine:1625]    General:   Intubated, unresponsive   Lungs:   Clear to auscultation bilaterally.   Chest wall:  No tenderness or deformity.   Heart:  Regular rate and rhythm, S1, S2 normal, no murmur, click, rub or gallop.   Abdomen:   Soft, non-tender. Bowel sounds normal. No masses,  No organomegaly.   Extremities: Extremities normal, atraumatic, no cyanosis or edema.   Pulses: 2+ and symmetric all extremities.   Skin: Skin color, texture, turgor normal. No rashes or lesions  Neurologic: CNII-XII intact.  Normal oculocephalic reflex.  No response to painful stimuli.  No withdrawal to pain         Data Review:       Recent Days:  No results for input(s): WBC, HGB, HCT, PLT, HGBEXT, HCTEXT, PLTEXT, HGBEXT, HCTEXT, PLTEXT in the last 72 hours.  No results for input(s): NA, K, CL, CO2, GLU, BUN, CREA, CA, MG, PHOS, ALB, TBIL, TBILI, ALT, INR, INREXT, INREXT in the last 72 hours.    No lab exists for component: SGOT  No results for input(s): PH, PCO2, PO2, HCO3, FIO2 in the last 72 hours.    24 Hour Results:  Recent Results (from the past 24 hour(s))   GLUCOSE, POC    Collection Time: 11/16/19  7:44 AM   Result Value Ref Range    Glucose (POC) 252 (H) 65 - 100 mg/dL    Performed by Leane Call    GLUCOSE, POC    Collection Time: 11/16/19 10:46 AM   Result Value Ref Range    Glucose (POC) 223 (H) 65 - 100 mg/dL    Performed by Leane Call    GLUCOSE, POC    Collection Time: 11/16/19  3:39 PM   Result Value Ref Range    Glucose (POC) 189 (H) 65 - 100 mg/dL    Performed by Gertie Fey    GLUCOSE, POC    Collection Time: 11/16/19 10:14 PM   Result Value Ref Range     Glucose (POC) 220 (H) 65 - 100 mg/dL    Performed by Artemio Aly EMILY    GLUCOSE, POC    Collection Time: 11/16/19 11:17 PM   Result Value Ref Range    Glucose (POC) 202 (H) 65 - 100 mg/dL    Performed by Artemio Aly EMILY    GLUCOSE, POC    Collection Time: 11/17/19  6:53 AM   Result Value Ref Range    Glucose (POC) 242 (H) 65 - 100 mg/dL    Performed by Artemio Aly EMILY        MRI BRAIN W WO CONT   Final Result   IMPRESSION: Restricted diffusion in the posterior temporal frontal parietal and   occipital regions consistent with hypoxic ischemic encephalopathy. Sparing of   the basal ganglia, thalamus and posterior fossa. Differential diagnoses includes   PRES (posterior reversible encephalopathy syndrome). Repeat MRI in a week's time   is recommended.      Prominent microvascular ischemic changes as described above.      CT HEAD WO CONT   Final Result   IMPRESSION: Similar findings compared to CT head 11/12/2019.                  XR CHEST PORT   Final Result   Impression: Underexpanded lungs with bibasilar atelectasis.  No significant   interval change.         CTA HEAD   Final Result   IMPRESSION:   1.  No evidence of acute intracranial process on noncontrast CT head.   2.  No large vessel occlusion, hemodynamically significant luminal stenosis or   intracranial aneurysm.      XR CHEST PORT   Final Result   Impression:    No acute cardiopulmonary process.      NM LUNG SCAN PERF   Final Result   Impression:   Normal perfusion scintigraphy.         XR CHEST SNGL V   Final Result   IMPRESSION:  1.  The endotracheal tube tip overlies the tracheal airway approximately 37 mm   above the carina.   2.  The lungs are clear without acute pulmonary parenchymal pathology.        XR CHEST SNGL V   Final Result   IMPRESSION:   1.  The lung parenchyma is clear without acute pulmonary parenchymal pathology.   2.  There is milder ectasia of the thoracic aorta related to mild   atherosclerotic vascular changes.        CT HEAD WO CONT    Final Result   IMPRESSION:    1.  This examination is negative for acute intracranial pathology.   2.  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   3.  There is apparent proptosis more pronounced on the right compared to left.                CT SPINE CERV WO CONT   Final Result   Impression:    1.  This examination is negative for an acute fracture or a subluxation injury   to the cervical spine.   2.  There is moderate degenerative disc disease at the C5-C6 level.   3.  There is right-sided facet arthropathy at the C2-C3 and the C3-C4 levels.   There is narrowing of the right neural foramen at C3-C4 level. There is   left-sided facet arthropathy primarily at the C2-C3 C4-C5 levels. There is   narrowing of the left C4-C5 neural foramen.                 Assessment:  Severe encephalopathy, anoxic encephalopathy.  Suspect she had a cardiac arrest event at home with ROSC    Acute respiratory failure with hypoxia, requiring mechanical ventilation    Hypotension    Aspiration pneumonia    History of lung cancer, details unknown    Diabetes mellitus type 2    History of stroke      Plan:  Continue mechanical ventilation and supportive care  Continue IV antibiotics  Neurology to speak with daughter this morning  Prognosis remains grim for any meaningful neurologic recovery  Start norepinephrine if needed      Care Plan discussed with: Nurse    Total time spent with patient: 30 minutes.    Ludwig Clarks, MD

## 2019-11-17 NOTE — Progress Notes (Signed)
Follow up visit with daughter [name] in ICU 261. Chaplain and daughter engaged in extended conversation re patient's status, family dynamics including [name] and her 2 brothers in their journey of processing patient's status. Chaplain and daughter reflected on their need to follow through with all measures of medical intervention in order to offer peace to the family that they did not give up too soon. Chaplain listened reflectively and empathically, affirmed family's decision making process. Patient's daughter was engaged with reading [employer] Choices for Loving People. Chaplain advised of chaplains availability for follow up upon further referrals.       Visited by Chaplain Dan Spreacker, BCC, MACM   Chaplain can be contacted by calling operator and requesting chaplain on call

## 2019-11-17 NOTE — Progress Notes (Signed)
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    Had CT scan of head done with contrast yesterday which did not show any malignant spread to the brain   remains on propofol To keep her Heart rate in range.   Patient dropped her blood pressure earlier and heart rate dropped as well.  Last night she was started on Diprivan for comfort.  Now she is on Levophed at 5 mics.  Not responding to stimuli per the nurse when taken off sedation      Review of Systems:  Review of systems not obtained due to patient factors.    Current Facility-Administered Medications   Medication Dose Route Frequency Provider Last Rate Last Admin   ??? NOREPINephrine (LEVOPHED) 8 mg in 5% dextrose (32 mcg/mL) infusion  0.5-30 mcg/min IntraVENous TITRATE Lieutenant Diego, MD 7.5 mL/hr at 11/17/19 0850 4 mcg/min at 11/17/19 0850   ??? glycopyrrolate (ROBINUL) injection 0.1 mg  0.1 mg IntraVENous TID PRN Lieutenant Diego, MD   0.1 mg at 11/16/19 2130   ??? insulin glargine (LANTUS) injection 5 Units  5 Units SubCUTAneous QHS Lieutenant Diego, MD   5 Units at 11/16/19 2225   ??? meropenem (MERREM) 1 g in sterile water (preservative free) 20 mL IV syringe  1 g IntraVENous Q12H Lieutenant Diego, MD   1 g at 11/17/19 0414   ??? metoprolol (LOPRESSOR) injection 5 mg  5 mg IntraVENous Q6H PRN Lieutenant Diego, MD       ??? propofol (DIPRIVAN) 10 mg/mL infusion  0-50 mcg/kg/min IntraVENous TITRATE Lieutenant Diego, MD 6.2 mL/hr at 11/16/19 1723 15 mcg/kg/min at 11/16/19 1723   ??? insulin lispro (HUMALOG) injection   SubCUTAneous Q6H Lieutenant Diego, MD   6 Units at 11/17/19 0656   ??? glucose chewable tablet 16 g  4 Tab Oral PRN Lieutenant Diego, MD        ??? glucagon (GLUCAGEN) injection 1 mg  1 mg IntraMUSCular PRN Lieutenant Diego, MD       ??? dextrose (D50W) injection syrg 12.5-25 g  25-50 mL IntraVENous PRN Roselyn Reef, Almedia Balls, MD       ??? chlorhexidine (PERIDEX) 0.12 % mouthwash 15 mL  15 mL Oral Q12H Kardar, Ahmed Marin Olp, MD   15 mL at 11/17/19 4403   ??? sodium chloride (NS) flush 5-40 mL  5-40 mL IntraVENous Q8H Bishop, Krysten, PA-C   10 mL at 11/17/19 0617   ??? sodium chloride (NS) flush 5-40 mL  5-40 mL IntraVENous PRN Bishop, Krysten, PA-C   10 mL at 11/11/19 0134   ??? acetaminophen (TYLENOL) tablet 650 mg  650 mg Oral Q6H PRN Bishop, Krysten, PA-C        Or   ??? acetaminophen (TYLENOL) suppository 650 mg  650 mg Rectal Q6H PRN Bishop, Krysten, PA-C       ??? ondansetron (ZOFRAN ODT) tablet 4 mg  4 mg Oral Q8H PRN Bishop, Krysten, PA-C        Or   ??? ondansetron (ZOFRAN) injection 4 mg  4 mg IntraVENous Q6H PRN Bishop, Krysten, PA-C   4 mg at 11/10/19 2346  No Known Allergies        Objective:     Blood pressure 116/65, pulse (!) 102, temperature 99.1 ??F (37.3 ??C), resp. rate 12, height 5' 0.98" (1.549 m), weight 70.7 kg (155 lb 13.8 oz), SpO2 99 %. Temp (24hrs), Avg:98.4 ??F (36.9 ??C), Min:98 ??F (36.7 ??C), Max:99.1 ??F (37.3 ??C)      Intake and Output:  Current Shift: No intake/output data recorded.  Last 3 Shifts: 01/06 1901 - 01/08 0700  In: 2419.2 [I.V.:69.2]  Out: 1626 [Urine:1625]    Physical Exam:    General:  Lying in bed, intubated on mechanical ventilation. Eyes closed today   Neck: Supple  Lung:  Patient has good air entry bilaterally.  No wheezing was appreciated.   Heart: S1+S2.  No murmurs, tachycardia  Abdomen: soft, non-tender. Bowel sounds normal. No masses; obese  Extremities: No edema  GU: Not done  Skin: No cyanosis   Neurologic:  Sedated on mechanical ventilation.  No response to noxious stimuli.  Lab/Data Review:  Recent Results (from the past 24 hour(s))   GLUCOSE, POC    Collection Time: 11/16/19 10:46 AM    Result Value Ref Range    Glucose (POC) 223 (H) 65 - 100 mg/dL    Performed by Cristina Gong    GLUCOSE, POC    Collection Time: 11/16/19  3:39 PM   Result Value Ref Range    Glucose (POC) 189 (H) 65 - 100 mg/dL    Performed by Dewy Rose, POC    Collection Time: 11/16/19 10:14 PM   Result Value Ref Range    Glucose (POC) 220 (H) 65 - 100 mg/dL    Performed by [place], POC    Collection Time: 11/16/19 11:17 PM   Result Value Ref Range    Glucose (POC) 202 (H) 65 - 100 mg/dL    Performed by [place], POC    Collection Time: 11/17/19  6:53 AM   Result Value Ref Range    Glucose (POC) 242 (H) 65 - 100 mg/dL    Performed by [place], POC    Collection Time: 11/17/19  8:48 AM   Result Value Ref Range    Glucose (POC) 179 (H) 65 - 100 mg/dL    Performed by Michaelle Copas      chest X-ray      MRI BRAIN W WO CONT   Final Result   IMPRESSION: Restricted diffusion in the posterior temporal frontal parietal and   occipital regions consistent with hypoxic ischemic encephalopathy. Sparing of   the basal ganglia, thalamus and posterior fossa. Differential diagnoses includes   PRES (posterior reversible encephalopathy syndrome). Repeat MRI in a week's time   is recommended.      Prominent microvascular ischemic changes as described above.      CT HEAD WO CONT   Final Result   IMPRESSION: Similar findings compared to CT head 11/12/2019.                  XR CHEST PORT   Final Result   Impression: Underexpanded lungs with bibasilar atelectasis.  No significant   interval change.         CTA HEAD   Final Result   IMPRESSION:   1.  No evidence of acute intracranial process on noncontrast CT head.   2.  No large vessel occlusion, hemodynamically significant luminal stenosis or  intracranial aneurysm.      XR CHEST PORT   Final Result   Impression:    No acute cardiopulmonary process.      NM LUNG SCAN PERF   Final Result   Impression:   Normal perfusion scintigraphy.          XR CHEST SNGL V   Final Result   IMPRESSION:   1.  The endotracheal tube tip overlies the tracheal airway approximately 37 mm   above the carina.   2.  The lungs are clear without acute pulmonary parenchymal pathology.        XR CHEST SNGL V   Final Result   IMPRESSION:   1.  The lung parenchyma is clear without acute pulmonary parenchymal pathology.   2.  There is milder ectasia of the thoracic aorta related to mild   atherosclerotic vascular changes.        CT HEAD WO CONT   Final Result   IMPRESSION:    1.  This examination is negative for acute intracranial pathology.   2.  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   3.  There is apparent proptosis more pronounced on the right compared to left.                CT SPINE CERV WO CONT   Final Result   Impression:    1.  This examination is negative for an acute fracture or a subluxation injury   to the cervical spine.   2.  There is moderate degenerative disc disease at the C5-C6 level.   3.  There is right-sided facet arthropathy at the C2-C3 and the C3-C4 levels.   There is narrowing of the right neural foramen at C3-C4 level. There is   left-sided facet arthropathy primarily at the C2-C3 C4-C5 levels. There is   narrowing of the left C4-C5 neural foramen.                CT Results  (Last 48 hours)    None          Assessment:     1.  A 69 year old African-American female with multiple medical problems including history of prior stroke and lung cancer who has been on chemotherapy, last chemotherapy in December 2020 , presented with unresponsiveness.      1.  Acute respiratory failure :  She was intubated enroute for airway protection.  Currently, she is off propofol.  She is minimally responsive, only opening eyes.  Apparently, she has a history of lung cancer.  Chest x-ray showed underinflation    Currently on assist control as outlined above  ABGs and chest x-ray reviewed   Ventilator settings modified, FiO2 decreased to 24%  ABG results from yesterday were reviewed which showed 7.5 1/33/83/27/97 on 24% FiO2  No further changes on the ventilator support.    2.  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      As discussed above,   Neurology is consulted.    Neurology noted that patient was posturing on right side.  CT scan of head with contrast was done which was unremarkable  MRI brain was done yesterday which showed ischemic changes in the brain    IMPRESSION: Restricted diffusion in the posterior temporal frontal parietal and   occipital regions consistent with hypoxic ischemic encephalopathy. Sparing of   the basal ganglia, thalamus and posterior fossa. Differential diagnoses includes  PRES (posterior reversible encephalopathy syndrome). Repeat MRI in a week's time   is recommended.     3.  History of lung cancer.    Patient has received last chemotherapy in December 2020, details are not available.    Chest x-ray, however, does not show any significant mass or nodule or other chronic changes.    4.  History of stroke.  5.  Diabetes mellitus.    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    For deep venous thrombosis prophylaxis, the patient is started on heparin subcutaneously, which will be continued.  For gastrointestinal stress prophylaxis, she is started on Protonix IV which will be continued.    Family changed their mind yesterday and was made full code,   This morning after she dropped her blood pressure and heart rate started slowing down, family made patient chemical code.  Patient is on pressors.  She will remain on the ventilator as per family's wishes.   Chest x-ray and blood gas in the morning.  Thank you for involving me in the care of this patient  I will follow with you closely during hospitalization     This document has been prepared by the Dragon voice recognition system, typographical errors may have occurred. Attempts have been made to correct errors, however inadvertent errors may persist.    Thompson Grayer, MD  [facility] ( [facility])  11/17/2019

## 2019-11-17 NOTE — Progress Notes (Signed)
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    Had CT scan of head done with contrast yesterday which did not show any malignant spread to the brain   remains on propofol To keep her Heart rate in range.   Patient dropped her blood pressure earlier and heart rate dropped as well.  Last night she was started on Diprivan for comfort.  Now she is on Levophed at 5 mics.  Not responding to stimuli per the nurse when taken off sedation      Review of Systems:  Review of systems not obtained due to patient factors.    Current Facility-Administered Medications   Medication Dose Route Frequency Provider Last Rate Last Admin   ??? NOREPINephrine (LEVOPHED) 8 mg in 5% dextrose (32 mcg/mL) infusion  0.5-30 mcg/min IntraVENous TITRATE Lieutenant Diego, MD 7.5 mL/hr at 11/17/19 0850 4 mcg/min at 11/17/19 0850   ??? glycopyrrolate (ROBINUL) injection 0.1 mg  0.1 mg IntraVENous TID PRN Lieutenant Diego, MD   0.1 mg at 11/16/19 2130   ??? insulin glargine (LANTUS) injection 5 Units  5 Units SubCUTAneous QHS Lieutenant Diego, MD   5 Units at 11/16/19 2225   ??? meropenem (MERREM) 1 g in sterile water (preservative free) 20 mL IV syringe  1 g IntraVENous Q12H Lieutenant Diego, MD   1 g at 11/17/19 0414   ??? metoprolol (LOPRESSOR) injection 5 mg  5 mg IntraVENous Q6H PRN Lieutenant Diego, MD       ??? propofol (DIPRIVAN) 10 mg/mL infusion  0-50 mcg/kg/min IntraVENous TITRATE Lieutenant Diego, MD 6.2 mL/hr at 11/16/19 1723 15 mcg/kg/min at 11/16/19 1723   ??? insulin lispro (HUMALOG) injection   SubCUTAneous Q6H Lieutenant Diego, MD   6 Units at 11/17/19 0656   ??? glucose chewable tablet 16 g  4 Tab Oral PRN Lieutenant Diego, MD       ??? glucagon (GLUCAGEN) injection 1 mg  1 mg IntraMUSCular PRN  Lieutenant Diego, MD       ??? dextrose (D50W) injection syrg 12.5-25 g  25-50 mL IntraVENous PRN Roselyn Reef, Almedia Balls, MD       ??? chlorhexidine (PERIDEX) 0.12 % mouthwash 15 mL  15 mL Oral Q12H Kardar, Ahmed Marin Olp, MD   15 mL at 11/17/19 9937   ??? sodium chloride (NS) flush 5-40 mL  5-40 mL IntraVENous Q8H Bishop, Krysten, PA-C   10 mL at 11/17/19 0617   ??? sodium chloride (NS) flush 5-40 mL  5-40 mL IntraVENous PRN Bishop, Krysten, PA-C   10 mL at 11/11/19 0134   ??? acetaminophen (TYLENOL) tablet 650 mg  650 mg Oral Q6H PRN Bishop, Krysten, PA-C        Or   ??? acetaminophen (TYLENOL) suppository 650 mg  650 mg Rectal Q6H PRN Bishop, Krysten, PA-C       ??? ondansetron (ZOFRAN ODT) tablet 4 mg  4 mg Oral Q8H PRN Bishop, Krysten, PA-C        Or   ??? ondansetron (ZOFRAN) injection 4 mg  4 mg IntraVENous Q6H PRN Bishop, Krysten, PA-C   4 mg at 11/10/19 2346  No Known Allergies        Objective:     Blood pressure 116/65, pulse (!) 102, temperature 99.1 ??F (37.3 ??C), resp. rate 12, height 5' 0.98" (1.549 m), weight 70.7 kg (155 lb 13.8 oz), SpO2 99 %. Temp (24hrs), Avg:98.4 ??F (36.9 ??C), Min:98 ??F (36.7 ??C), Max:99.1 ??F (37.3 ??C)      Intake and Output:  Current Shift: No intake/output data recorded.  Last 3 Shifts: 01/06 1901 - 01/08 0700  In: 2419.2 [I.V.:69.2]  Out: 1626 [Urine:1625]    Physical Exam:    General:  Lying in bed, intubated on mechanical ventilation. Eyes closed today   Neck: Supple  Lung:  Patient has good air entry bilaterally.  No wheezing was appreciated.   Heart: S1+S2.  No murmurs, tachycardia  Abdomen: soft, non-tender. Bowel sounds normal. No masses; obese  Extremities: No edema  GU: Not done  Skin: No cyanosis   Neurologic:  Sedated on mechanical ventilation.  No response to noxious stimuli.  Lab/Data Review:  Recent Results (from the past 24 hour(s))   GLUCOSE, POC    Collection Time: 11/16/19 10:46 AM   Result Value Ref Range    Glucose (POC) 223 (H) 65 - 100 mg/dL    Performed by  Cristina Gong    GLUCOSE, POC    Collection Time: 11/16/19  3:39 PM   Result Value Ref Range    Glucose (POC) 189 (H) 65 - 100 mg/dL    Performed by [place], POC    Collection Time: 11/16/19 10:14 PM   Result Value Ref Range    Glucose (POC) 220 (H) 65 - 100 mg/dL    Performed by [place], POC    Collection Time: 11/16/19 11:17 PM   Result Value Ref Range    Glucose (POC) 202 (H) 65 - 100 mg/dL    Performed by [place], POC    Collection Time: 11/17/19  6:53 AM   Result Value Ref Range    Glucose (POC) 242 (H) 65 - 100 mg/dL    Performed by [place], POC    Collection Time: 11/17/19  8:48 AM   Result Value Ref Range    Glucose (POC) 179 (H) 65 - 100 mg/dL    Performed by Michaelle Copas      chest X-ray      MRI BRAIN W WO CONT   Final Result   IMPRESSION: Restricted diffusion in the posterior temporal frontal parietal and   occipital regions consistent with hypoxic ischemic encephalopathy. Sparing of   the basal ganglia, thalamus and posterior fossa. Differential diagnoses includes   PRES (posterior reversible encephalopathy syndrome). Repeat MRI in a week's time   is recommended.      Prominent microvascular ischemic changes as described above.      CT HEAD WO CONT   Final Result   IMPRESSION: Similar findings compared to CT head 11/12/2019.                  XR CHEST PORT   Final Result   Impression: Underexpanded lungs with bibasilar atelectasis.  No significant   interval change.         CTA HEAD   Final Result   IMPRESSION:   1.  No evidence of acute intracranial process on noncontrast CT head.   2.  No large vessel occlusion, hemodynamically significant luminal stenosis or  intracranial aneurysm.      XR CHEST PORT   Final Result   Impression:    No acute cardiopulmonary process.      NM LUNG SCAN PERF   Final Result   Impression:   Normal perfusion scintigraphy.         XR CHEST SNGL V   Final Result   IMPRESSION:   1.  The endotracheal tube  tip overlies the tracheal airway approximately 37 mm   above the carina.   2.  The lungs are clear without acute pulmonary parenchymal pathology.        XR CHEST SNGL V   Final Result   IMPRESSION:   1.  The lung parenchyma is clear without acute pulmonary parenchymal pathology.   2.  There is milder ectasia of the thoracic aorta related to mild   atherosclerotic vascular changes.        CT HEAD WO CONT   Final Result   IMPRESSION:    1.  This examination is negative for acute intracranial pathology.   2.  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   3.  There is apparent proptosis more pronounced on the right compared to left.                CT SPINE CERV WO CONT   Final Result   Impression:    1.  This examination is negative for an acute fracture or a subluxation injury   to the cervical spine.   2.  There is moderate degenerative disc disease at the C5-C6 level.   3.  There is right-sided facet arthropathy at the C2-C3 and the C3-C4 levels.   There is narrowing of the right neural foramen at C3-C4 level. There is   left-sided facet arthropathy primarily at the C2-C3 C4-C5 levels. There is   narrowing of the left C4-C5 neural foramen.                CT Results  (Last 48 hours)    None          Assessment:     1.  A 69 year old African-American female with multiple medical problems including history of prior stroke and lung cancer who has been on chemotherapy, last chemotherapy in December 2020 , presented with unresponsiveness.      1.  Acute respiratory failure :  She was intubated enroute for airway protection.  Currently, she is off propofol.  She is minimally responsive, only opening eyes.  Apparently, she has a history of lung cancer.  Chest x-ray showed underinflation    Currently on assist control as outlined above  ABGs and chest x-ray reviewed  Ventilator settings modified, FiO2 decreased to 24%  ABG results from yesterday were  reviewed which showed 7.5 1/33/83/27/97 on 24% FiO2  No further changes on the ventilator support.    2.  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      As discussed above,   Neurology is consulted.    Neurology noted that patient was posturing on right side.  CT scan of head with contrast was done which was unremarkable  MRI brain was done yesterday which showed ischemic changes in the brain    IMPRESSION: Restricted diffusion in the posterior temporal frontal parietal and   occipital regions consistent with hypoxic ischemic encephalopathy. Sparing of   the basal ganglia, thalamus and posterior fossa. Differential diagnoses includes  PRES (posterior reversible encephalopathy syndrome). Repeat MRI in a week's time   is recommended.     3.  History of lung cancer.    Patient has received last chemotherapy in December 2020, details are not available.    Chest x-ray, however, does not show any significant mass or nodule or other chronic changes.    4.  History of stroke.  5.  Diabetes mellitus.    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    For deep venous thrombosis prophylaxis, the patient is started on heparin subcutaneously, which will be continued.  For gastrointestinal stress prophylaxis, she is started on Protonix IV which will be continued.    Family changed their mind yesterday and was made full code,   This morning after she dropped her blood pressure and heart rate started slowing down, family made patient chemical code.  Patient is on pressors.  She will remain on the ventilator as per family's wishes.   Chest x-ray and blood gas in the morning.  Thank you for involving me in the care of this patient  I will follow with you closely during hospitalization    This document has been prepared by the Dragon voice recognition system, typographical errors may have occurred. Attempts have been made to correct errors,  however inadvertent errors may persist.    Thompson Grayer, MD  [facility] ( [facility])  11/17/2019

## 2019-11-17 NOTE — Progress Notes (Signed)
Progress Notes by Lieutenant Diego, MD at 11/17/19 [id number]                Author: Lieutenant Diego, MD  Service: Internal Medicine  Author Type: Physician       Filed: 11/17/19 0854  Date of Service: 11/17/19 0737  Status: Signed          Editor: Lieutenant Diego, MD (Physician)                              Hospitalist Progress Note                   Daily Progress Note: 11/17/2019           Subjective:     The patient is seen for follow up.   Condition is unchanged.   Patient remains unresponsive.  Blood pressure starting to trend down.  Her daughter is at the bedside, asking to speak  with the neurologist      Problem List:      Problem List  as of 11/17/2019  Never Reviewed                        Codes  Class  Noted - Resolved             AMS (altered mental status)  ICD-10-CM: R41.82   ICD-9-CM: 780.97    11/10/2019 - Present                          Medications reviewed     Current Facility-Administered Medications          Medication  Dose  Route  Frequency           ?  glycopyrrolate (ROBINUL) injection 0.1 mg   0.1 mg  IntraVENous  TID PRN     ?  insulin glargine (LANTUS) injection 5 Units   5 Units  SubCUTAneous  QHS     ?  meropenem (MERREM) 1 g in sterile water (preservative free) 20 mL IV syringe   1 g  IntraVENous  Q12H     ?  metoprolol (LOPRESSOR) injection 5 mg   5 mg  IntraVENous  Q6H PRN     ?  propofol (DIPRIVAN) 10 mg/mL infusion   0-50 mcg/kg/min  IntraVENous  TITRATE           ?  insulin lispro (HUMALOG) injection     SubCUTAneous  Q6H           ?  glucose chewable tablet 16 g   4 Tab  Oral  PRN     ?  glucagon (GLUCAGEN) injection 1 mg   1 mg  IntraMUSCular  PRN     ?  dextrose (D50W) injection syrg 12.5-25 g   25-50 mL  IntraVENous  PRN     ?  chlorhexidine (PERIDEX) 0.12 % mouthwash 15 mL   15 mL  Oral  Q12H     ?  sodium chloride (NS) flush 5-40 mL   5-40 mL  IntraVENous  Q8H     ?  sodium chloride (NS) flush 5-40 mL   5-40 mL  IntraVENous  PRN     ?  acetaminophen (TYLENOL) tablet 650 mg    650 mg  Oral  Q6H PRN          Or           ?  acetaminophen (TYLENOL) suppository 650 mg   650 mg  Rectal  Q6H PRN     ?  ondansetron (ZOFRAN ODT) tablet 4 mg   4 mg  Oral  Q8H PRN          Or           ?  ondansetron (ZOFRAN) injection 4 mg   4 mg  IntraVENous  Q6H PRN             Review of Systems:     Review of systems not obtained due to patient factors.        Objective:     Physical Exam:       Visit Vitals      BP  116/65 (BP 1 Location: Left arm, BP Patient Position: At rest;Head of bed elevated (Comment degrees))     Pulse  (!) 102     Temp  99.1 ??F (37.3 ??C)     Resp  12     Ht  5' 0.98" (1.549 m)     Wt  70.7 kg (155 lb 13.8 oz)     SpO2  99%        BMI  29.47 kg/m??        O2 Device:  Ventilator      Temp (24hrs), Avg:98.4 ??F (36.9 ??C), Min:98 ??F (36.7 ??C), Max:99.1 ??F (37.3 ??C)     No intake/output data recorded.    01/06 1901 - 01/08 0700   In: 2419.2 [I.V.:69.2]   Out: 1626 [Urine:1625]         General:    Intubated, unresponsive        Lungs:    Clear to auscultation bilaterally.        Chest wall:   No tenderness or deformity.        Heart:   Regular rate and rhythm, S1, S2 normal, no murmur, click, rub or gallop.        Abdomen:    Soft, non-tender. Bowel sounds normal. No masses,  No organomegaly.     Extremities:  Extremities normal, atraumatic, no cyanosis or edema.     Pulses:  2+ and symmetric all extremities.     Skin:  Skin color, texture, turgor normal. No rashes or lesions        Neurologic:  CNII-XII intact.  Normal oculocephalic reflex.  No response to painful stimuli.  No withdrawal to pain                Data Review:          Recent Days:   No results for input(s): WBC, HGB, HCT, PLT, HGBEXT, HCTEXT, PLTEXT, HGBEXT, HCTEXT, PLTEXT in the last 72 hours.   No results for input(s): NA, K, CL, CO2, GLU, BUN, CREA, CA, MG, PHOS, ALB, TBIL, TBILI, ALT, INR, INREXT, INREXT in the last 72 hours.      No lab exists for component: SGOT   No results for input(s): PH, PCO2, PO2, HCO3, FIO2 in  the last 72 hours.      24 Hour Results:     Recent Results (from the past 24 hour(s))     GLUCOSE, POC          Collection Time: 11/16/19  7:44 AM         Result  Value  Ref Range            Glucose (POC)  252 (H)  65 - 100 mg/dL  Performed by  Cristina Gong         GLUCOSE, POC          Collection Time: 11/16/19 10:46 AM         Result  Value  Ref Range            Glucose (POC)  223 (H)  65 - 100 mg/dL       Performed by  Cristina Gong         GLUCOSE, POC          Collection Time: 11/16/19  3:39 PM         Result  Value  Ref Range            Glucose (POC)  189 (H)  65 - 100 mg/dL            Performed by  [place], POC          Collection Time: 11/16/19 10:14 PM         Result  Value  Ref Range            Glucose (POC)  220 (H)  65 - 100 mg/dL       Performed by  [place], POC          Collection Time: 11/16/19 11:17 PM         Result  Value  Ref Range            Glucose (POC)  202 (H)  65 - 100 mg/dL       Performed by  [place], POC          Collection Time: 11/17/19  6:53 AM         Result  Value  Ref Range            Glucose (POC)  242 (H)  65 - 100 mg/dL            Performed by  Hulen Shouts EMILY               MRI BRAIN W WO CONT       Final Result     IMPRESSION: Restricted diffusion in the posterior temporal frontal parietal and     occipital regions consistent with hypoxic ischemic encephalopathy. Sparing of     the basal ganglia, thalamus and posterior fossa. Differential diagnoses includes     PRES (posterior reversible encephalopathy syndrome). Repeat MRI in a week's time     is recommended.          Prominent microvascular ischemic changes as described above.            CT HEAD WO CONT       Final Result     IMPRESSION: Similar findings compared to CT head 11/12/2019.                                XR CHEST PORT       Final Result     Impression: Underexpanded lungs with bibasilar atelectasis.  No significant     interval change.                  CTA HEAD       Final Result     IMPRESSION:  1.  No evidence of acute intracranial process on noncontrast CT head.     2.  No large vessel occlusion, hemodynamically significant luminal stenosis or     intracranial aneurysm.            XR CHEST PORT       Final Result     Impression:      No acute cardiopulmonary process.            NM LUNG SCAN PERF       Final Result     Impression:     Normal perfusion scintigraphy.                 XR CHEST SNGL V       Final Result     IMPRESSION:     1.  The endotracheal tube tip overlies the tracheal airway approximately 37 mm     above the carina.     2.  The lungs are clear without acute pulmonary parenchymal pathology.              XR CHEST SNGL V       Final Result     IMPRESSION:     1.  The lung parenchyma is clear without acute pulmonary parenchymal pathology.     2.  There is milder ectasia of the thoracic aorta related to mild     atherosclerotic vascular changes.              CT HEAD WO CONT       Final Result     IMPRESSION:      1.  This examination is negative for acute intracranial pathology.     2.  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     3.  There is apparent proptosis more pronounced on the right compared to left.                            CT SPINE CERV WO CONT       Final Result     Impression:      1.  This examination is negative for an acute fracture or a subluxation injury     to the cervical spine.     2.  There is moderate degenerative disc disease at the C5-C6 level.     3.  There is right-sided facet arthropathy at the C2-C3 and the C3-C4 levels.     There is narrowing of the right neural foramen at C3-C4 level. There is     left-sided facet arthropathy primarily at the C2-C3 C4-C5 levels. There is     narrowing of the left C4-C5 neural foramen.                             Assessment:   Severe encephalopathy, anoxic encephalopathy.  Suspect she had a cardiac  arrest event at home with ROSC      Acute respiratory failure with hypoxia, requiring mechanical ventilation      Hypotension      Aspiration pneumonia      History of lung cancer, details unknown      Diabetes mellitus type 2      History of stroke         Plan:   Continue mechanical ventilation  and supportive care   Continue IV antibiotics   Neurology to speak with daughter this morning   Prognosis remains grim for any meaningful neurologic recovery   Start norepinephrine if needed         Care Plan discussed with: Nurse      Total time spent with patient: 30 minutes.      Lieutenant Diego, MD

## 2019-11-17 NOTE — Progress Notes (Signed)
Bedside shift report given to Laruth Bouchard, RN.

## 2019-11-17 NOTE — Progress Notes (Signed)
Follow up visit with daughter Ander Slade in ICU 261. Chaplain and daughter engaged in extended conversation re patient's status, family dynamics including [name] and her 2 brothers in their journey of processing patient's status. Chaplain and daughter reflected on their need to follow through with all measures of medical intervention in order to offer peace to the family that they did not give up too soon. Chaplain listened reflectively and empathically, affirmed family's decision making process. Patient's daughter was engaged with reading [employer] for [employer]. Chaplain advised of chaplains availability for follow up upon further referrals.       Visited by Marvis Moeller Spreacker, BCC, MACM   Chaplain can be contacted by calling operator and requesting chaplain on call

## 2019-11-17 NOTE — Progress Notes (Signed)
Chart reviewed. Patient remains on the ventilator. Patient is now chemical code only. Patient's son will be coming to the hospital tomorrow prior to making any final decisions. CM will continue to follow.

## 2019-11-17 NOTE — Progress Notes (Signed)
NEUROLOGY  PROGRESS NOTE    Admission History/Pertinent Events  Kristin Cooper is a 69 y.o. year old female who presented on 11/10/2019. Patient has a past medical history of Lung Cancer, HTN, DM, HL who initially presented after being found unresponsive by family.  Per chart review, patient had several episodes of unresponsiveness on the week prior to presentation.  Per daughter's report.  Patient reportedly has behavioral arrest and then has staring episodes with decreased responsiveness.  On day of presentation, patient had a similar episode but then had vomiting thereafter.  Patient became more responsive than usual and family members reportedly started CPR.  EMS came to find patient had a good pulse but intubated patient and gave her naloxone due to concerns for airway protection.  Patient's glucose is initially was found to be 53. Emergent CT head was negative for any acute findings.  Chest x-ray concerning for possible aspiration pneumonia for which patient was started on IV antibiotics.    Home Stroke Prophylaxis: Atorvastatin 40      ASSESSMENT/PLAN      Impression  Per chart review looks like patient's family will give patient a little bit more time before making further medical decisions in regards to possible comfort care.  No significant changes on patient's neurological examination.  Patient's prognosis remains poor.      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  CMIC    CTH WO (11/12/18 d/t new RUE extensor posturing)  No interval changes from prior study    EEG  Moderate to severe generalized encephalopathy without evidence of cortical irritability    MRI Brain WWO  Restricted diffusion in the bilateral frontal, parietal, temporal and occipital cortical regions most concerning for hypoxic ischemic injury      Plan      1) Encephalopathy  Associated: Hypoxic Brain Injury, Lung Cancer, Hypoglycemia, PNA  -Q4NeuroChecks, TELE  -Seizure Precautions  -No  AEDs at the current time  -STAT IV lorazepam 2 mg with any clinical seizure activity lasting greater than 3 minutes and contact Neurology for further recommendations  -Management of infectious/metabolic derangements to referring teams    2) Remote Basal Gangliar Infarcts  -Stroke Prophylaxis: ASA 81, Atorvastatin 40  -SBP Goal < 160        SUBJECTIVE   Patient currently on a propofol drip and meropenem.  No significant changes on neurological examination.       Physical/Neurological Exam  [name],  Cardiovascular: normal rate and rhythm  Pulmonary: CTAB    Patient intubated  Patient does not follow any central or peripheral commands  Does not attempt to speak  Disconjugate gaze  Pupils react to light bilaterally  Oculocephalics intact  Corneal reflex intact bilaterally  No BTT bilaterally  No facial droop  Cough and gag are intact  Tongue is midline under ETT  Motor   LUE: 3/5   RUE: extensor posturing   BLE: 1/5 to pain  No abnormal movements  Normal tone throughout      OBJECTIVE  Vital Signs  Temp:  [97.4 ??F (36.3 ??C)-99.7 ??F (37.6 ??C)]   Pulse (Heart Rate):  [91-115]   BP: (72-161)/(60-97)   Resp Rate:  [11-16]   O2 Sat (%):  [95 %-100 %]   Weight:  [65.7 kg (144 lb 13.5 oz)-68.8 kg (151 lb 10.8 oz)]     MEDICATIONS    Current Facility-Administered Medications:   ???  insulin glargine (LANTUS)  injection 5 Units, 5 Units, SubCUTAneous, QHS, Kardar, Ahmed Mertie Moores, MD, 5 Units at 11/15/19 2106  ???  insulin lispro (HUMALOG) injection 3 Units, 3 Units, SubCUTAneous, TIDPC, Kardar, Ahmed Mertie Moores, MD, 3 Units at 11/16/19 0815  ???  aspirin delayed-release tablet 81 mg, 81 mg, Oral, DAILY, Linward Natal, MD, 81 mg at 11/16/19 0815  ???  atorvastatin (LIPITOR) tablet 40 mg, 40 mg, Oral, DAILY, Linward Natal, MD, 40 mg at 11/16/19 0815  ???  meropenem (MERREM) 1 g in sterile water (preservative free) 20 mL IV syringe, 1 g, IntraVENous, Q12H, Kardar, Ahmed Mertie Moores, MD, 1 g at 11/16/19 0815  ???   chlorhexidine (PERIDEX) 0.12 % mouthwash 15 mL, 15 mL, Oral, Q12H, Kardar, Ahmed Mertie Moores, MD, 15 mL at 11/16/19 0815  ???  levoFLOXacin (LEVAQUIN) 500 mg in D5W IVPB, 500 mg, IntraVENous, Q48H, Kardar, Ahmed Mertie Moores, MD, Last Rate: 100 mL/hr at 11/14/19 1629, 500 mg at 11/14/19 1629  ???  albuterol-ipratropium (DUO-NEB) 2.5 MG-0.5 MG/3 ML, 3 mL, Nebulization, Q6H PRN, Mughal, Zahid, MD  ???  propofol (DIPRIVAN) 10 mg/mL infusion, 0-50 mcg/kg/min (Order-Specific), IntraVENous, TITRATE, Pettis, Marsh Dolly, MD, Last Rate: 8.2 mL/hr at 11/16/19 0436, 15 mcg/kg/min at 11/16/19 0436  ???  glucose chewable tablet 16 g, 4 Tab, Oral, PRN, Bishop, Krysten, PA-C  ???  dextrose (D50W) injection syrg 12.5-25 g, 25-50 mL, IntraVENous, PRN, Bishop, Krysten, PA-C, 25 g at 11/11/19 0133  ???  glucagon (GLUCAGEN) injection 1 mg, 1 mg, IntraMUSCular, PRN, Bishop, Krysten, PA-C  ???  insulin lispro (HUMALOG) injection, , SubCUTAneous, AC&HS, Bishop, Krysten, PA-C, 5 Units at 11/16/19 0814  ???  metoprolol (LOPRESSOR) injection 5 mg, 5 mg, IntraVENous, Q6H PRN, Bishop, Krysten, PA-C, 5 mg at 11/12/19 1350  ???  sodium chloride (NS) flush 5-40 mL, 5-40 mL, IntraVENous, Q8H, Bishop, Krysten, PA-C, 10 mL at 11/16/19 0600  ???  sodium chloride (NS) flush 5-40 mL, 5-40 mL, IntraVENous, PRN, Bishop, Krysten, PA-C, 10 mL at 11/11/19 0134  ???  acetaminophen (TYLENOL) tablet 650 mg, 650 mg, Oral, Q6H PRN **OR** acetaminophen (TYLENOL) suppository 650 mg, 650 mg, Rectal, Q6H PRN, Bishop, Krysten, PA-C  ???  polyethylene glycol (MIRALAX) packet 17 g, 17 g, Oral, DAILY PRN, Bishop, Krysten, PA-C  ???  ondansetron (ZOFRAN ODT) tablet 4 mg, 4 mg, Oral, Q8H PRN **OR** ondansetron (ZOFRAN) injection 4 mg, 4 mg, IntraVENous, Q6H PRN, Bishop, Krysten, PA-C, 4 mg at 11/10/19 2346  ???  heparin (porcine) injection 5,000 Units, 5,000 Units, SubCUTAneous, Q8H, Bishop, Krysten, PA-C, 5,000 Units at 11/16/19 [id number]      Labs: I've reviewed the labs for today     This document has  been prepared by the Dragon voice recognition system, typographical errors may have occurred. Attempts have been made to correct errors, however inadvertent errors may persist.

## 2019-11-17 NOTE — Progress Notes (Signed)
At bedside with pt,  Systolic pressures dropped from 110's to 50's. Levophed started to increase perfusion. Heart rhythm change from low 100's to 60's. PACs and sinus arrhythmias present with ST elevation. Stat EKG ordered.     Daughter at bedside during episode. Decision made to make pt a chemical code only. Dr. Ricki Rodriguez notified for order.     Levophed titrated and BP 111/78. Heart rate up to 80. Will continue to monitor very closely.

## 2019-11-17 NOTE — Progress Notes (Signed)
Bedside and Verbal shift change report given to E-Beth Turner RN (oncoming nurse) by Daine Floras RN (offgoing nurse). Report included the following information SBAR.

## 2019-11-18 ENCOUNTER — Inpatient Hospital Stay: Admit: 2019-11-18 | Payer: MEDICARE | Primary: Adult Health

## 2019-11-18 ENCOUNTER — Other Ambulatory Visit: Payer: Self-pay | Admitting: Adult Health

## 2019-11-18 DIAGNOSIS — Z760 Encounter for issue of repeat prescription: Secondary | ICD-10-CM

## 2019-11-18 LAB — BLOOD GAS, ARTERIAL
BASE EXCESS: 5.5 mmol/L — ABNORMAL HIGH (ref 0–2)
BICARBONATE: 29 mmol/L — ABNORMAL HIGH (ref 22–26)
Base Excess: 5.5 mmol/L — ABNORMAL HIGH (ref 0–2)
EPAP/CPAP/PEEP: 5
EPAP/CPAP/PEEP: 5
FIO2: 24 %
FIO2: 24 %
HCO3: 29 mmol/L — ABNORMAL HIGH (ref 22–26)
IPAP/PIP: 0
IPAP/PIP: 0
O2 SAT: 97 % (ref >95)
O2 Sat: 97 % (ref >95)
PCO2: 37 mmHg (ref 35–45)
PCO2: 37 mmHg (ref 35–45)
PO2: 81 mmHg (ref 75–100)
PO2: 81 mmHg (ref 75–100)
SET RATE: 12
Set Rate: 12
Tidal Volume: 450
Tidal volume: 450
pH: 7.5 — ABNORMAL HIGH (ref 7.35–7.45)
pH: 7.5 — ABNORMAL HIGH (ref 7.35–7.45)

## 2019-11-18 LAB — GLUCOSE, POC
Glucose (POC): 200 mg/dL — ABNORMAL HIGH (ref 65–100)
Glucose (POC): 265 mg/dL — ABNORMAL HIGH (ref 65–100)
Glucose (POC): 259 mg/dL — ABNORMAL HIGH (ref 65–100)
Glucose (POC): 248 mg/dL — ABNORMAL HIGH (ref 65–100)
Glucose (POC): 328 mg/dL — ABNORMAL HIGH (ref 65–100)

## 2019-11-18 LAB — POCT GLUCOSE
POC Glucose: 200 mg/dL — ABNORMAL HIGH (ref 65–100)
POC Glucose: 265 mg/dL — ABNORMAL HIGH (ref 65–100)
POC Glucose: 259 mg/dL — ABNORMAL HIGH (ref 65–100)
POC Glucose: 248 mg/dL — ABNORMAL HIGH (ref 65–100)
POC Glucose: 328 mg/dL — ABNORMAL HIGH (ref 65–100)

## 2019-11-18 MED FILL — MEROPENEM 1 GRAM IV SOLR: 1 gram | INTRAVENOUS | Qty: 1

## 2019-11-18 MED FILL — HUMALOG U-100 INSULIN 100 UNIT/ML SUBCUTANEOUS SOLUTION: 100 unit/mL | SUBCUTANEOUS | Qty: 9

## 2019-11-18 MED FILL — CHLORHEXIDINE GLUCONATE 0.12 % MOUTHWASH: 0.12 % | Qty: 15

## 2019-11-18 MED FILL — HUMALOG U-100 INSULIN 100 UNIT/ML SUBCUTANEOUS SOLUTION: 100 unit/mL | SUBCUTANEOUS | Qty: 6

## 2019-11-18 MED FILL — LANTUS U-100 INSULIN 100 UNIT/ML SUBCUTANEOUS SOLUTION: 100 unit/mL | SUBCUTANEOUS | Qty: 5

## 2019-11-18 MED FILL — METOPROLOL TARTRATE 5 MG/5 ML IV SOLN: 5 mg/ mL | INTRAVENOUS | Qty: 5

## 2019-11-18 MED FILL — HUMALOG U-100 INSULIN 100 UNIT/ML SUBCUTANEOUS SOLUTION: 100 unit/mL | SUBCUTANEOUS | Qty: 12

## 2019-11-18 NOTE — Progress Notes (Signed)
Hospitalist Progress Note               Daily Progress Note: 11/18/2019      Subjective:   The patient is seen for follow up.   Condition is unchanged.   Patient had a transient episode yesterday where she dropped her blood pressure and heart rate.  Family decided to make her chemical code after that.  I believe they are all coming in again today to make a decision regarding a transition to palliative care which would seem most appropriate    Problem List:  Problem List as of 11/18/2019 Never Reviewed          Codes Class Noted - Resolved    AMS (altered mental status) ICD-10-CM: R41.82  ICD-9-CM: 780.97  11/10/2019 - Present              Medications reviewed  Current Facility-Administered Medications   Medication Dose Route Frequency   ??? NOREPINephrine (LEVOPHED) 8 mg in 5% dextrose (32 mcg/mL) infusion  0.5-30 mcg/min IntraVENous TITRATE   ??? glycopyrrolate (ROBINUL) injection 0.1 mg  0.1 mg IntraVENous TID PRN   ??? insulin glargine (LANTUS) injection 5 Units  5 Units SubCUTAneous QHS   ??? meropenem (MERREM) 1 g in sterile water (preservative free) 20 mL IV syringe  1 g IntraVENous Q12H   ??? metoprolol (LOPRESSOR) injection 5 mg  5 mg IntraVENous Q6H PRN   ??? propofol (DIPRIVAN) 10 mg/mL infusion  0-50 mcg/kg/min IntraVENous TITRATE   ??? insulin lispro (HUMALOG) injection   SubCUTAneous Q6H   ??? glucose chewable tablet 16 g  4 Tab Oral PRN   ??? glucagon (GLUCAGEN) injection 1 mg  1 mg IntraMUSCular PRN   ??? dextrose (D50W) injection syrg 12.5-25 g  25-50 mL IntraVENous PRN   ??? chlorhexidine (PERIDEX) 0.12 % mouthwash 15 mL  15 mL Oral Q12H   ??? sodium chloride (NS) flush 5-40 mL  5-40 mL IntraVENous Q8H   ??? sodium chloride (NS) flush 5-40 mL  5-40 mL IntraVENous PRN   ??? acetaminophen (TYLENOL) tablet 650 mg  650 mg Oral Q6H PRN    Or   ??? acetaminophen (TYLENOL) suppository 650 mg  650 mg Rectal Q6H PRN   ??? ondansetron (ZOFRAN ODT) tablet 4 mg  4 mg Oral Q8H PRN    Or    ??? ondansetron (ZOFRAN) injection 4 mg  4 mg IntraVENous Q6H PRN       Review of Systems:   Review of systems not obtained due to patient factors.    Objective:   Physical Exam:     Visit Vitals  BP (!) 152/90 (BP 1 Location: Right arm, BP Patient Position: At rest;Head of bed elevated (Comment degrees))   Pulse 100   Temp 97.7 ??F (36.5 ??C)   Resp 11   Ht 5' 0.98" (1.549 m)   Wt 69.2 kg (152 lb 8.9 oz)   SpO2 100%   BMI 28.84 kg/m??      O2 Device: Ventilator    Temp (24hrs), Avg:98.2 ??F (36.8 ??C), Min:97.7 ??F (36.5 ??C), Max:98.6 ??F (37 ??C)    No intake/output data recorded.   01/07 1901 - 01/09 0700  In: 1393.4 [I.V.:208.4]  Out: 1825 [Urine:1825]    General:   Intubated, unresponsive   Lungs:   Clear to auscultation bilaterally.   Chest wall:  No tenderness or deformity.   Heart:  Regular rate and rhythm, S1, S2 normal, no murmur, click, rub or gallop.  Abdomen:   Soft, non-tender. Bowel sounds normal. No masses,  No organomegaly.   Extremities: Extremities normal, atraumatic, no cyanosis or edema.   Pulses: 2+ and symmetric all extremities.   Skin: Skin color, texture, turgor normal. No rashes or lesions   Neurologic: CNII-XII intact.  Normal oculocephalic reflex.  No response to painful stimuli.  No withdrawal to pain         Data Review:       Recent Days:  No results for input(s): WBC, HGB, HCT, PLT, HGBEXT, HCTEXT, PLTEXT, HGBEXT, HCTEXT, PLTEXT in the last 72 hours.  No results for input(s): NA, K, CL, CO2, GLU, BUN, CREA, CA, MG, PHOS, ALB, TBIL, TBILI, ALT, INR, INREXT, INREXT in the last 72 hours.    No lab exists for component: SGOT  Recent Labs     11/18/19  0445   PH 7.50*   PCO2 37   PO2 81   HCO3 29*   FIO2 24.0       24 Hour Results:  Recent Results (from the past 24 hour(s))   GLUCOSE, POC    Collection Time: 11/17/19  8:48 AM   Result Value Ref Range    Glucose (POC) 179 (H) 65 - 100 mg/dL    Performed by Collene Leyden    EKG, 12 LEAD, SUBSEQUENT    Collection Time: 11/17/19  9:59 AM    Result Value Ref Range    Ventricular Rate 72 BPM    Atrial Rate 72 BPM    P-R Interval 196 ms    QRS Duration 84 ms    Q-T Interval 420 ms    QTC Calculation (Bezet) 459 ms    Calculated P Axis 28 degrees    Calculated R Axis 48 degrees    Calculated T Axis 93 degrees    Diagnosis       Normal sinus rhythm  Nonspecific ST and T wave abnormality  Abnormal ECG  When compared with ECG of 10-Nov-2019 09:31,  Vent. rate has decreased BY  56 BPM  Right bundle branch block is no longer Present  Confirmed by Lloyd Huger ([id number]) on 11/17/2019 10:32:50 AM     GLUCOSE, POC    Collection Time: 11/17/19 12:21 PM   Result Value Ref Range    Glucose (POC) 323 (H) 65 - 100 mg/dL    Performed by Delice Lesch    GLUCOSE, POC    Collection Time: 11/17/19  6:13 PM   Result Value Ref Range    Glucose (POC) 237 (H) 65 - 100 mg/dL    Performed by Delice Lesch    GLUCOSE, POC    Collection Time: 11/17/19  8:43 PM   Result Value Ref Range    Glucose (POC) 200 (H) 65 - 100 mg/dL    Performed by Artemio Aly EMILY    GLUCOSE, POC    Collection Time: 11/17/19 11:15 PM   Result Value Ref Range    Glucose (POC) 265 (H) 65 - 100 mg/dL    Performed by Artemio Aly EMILY    BLOOD GAS, ARTERIAL    Collection Time: 11/18/19  4:45 AM   Result Value Ref Range    pH 7.50 (H) 7.35 - 7.45      PCO2 37 35 - 45 mmHg    PO2 81 75 - 100 mmHg    O2 SAT 97 >95 %    BICARBONATE 29 (H) 22 - 26 mmol/L    BASE EXCESS 5.5 (H) 0 - 2 mmol/L  O2 METHOD VENT      FIO2 24.0 %    MODE Assist Control/Volume Control      Tidal volume 450      SET RATE 12      IPAP/PIP 0      EPAP/CPAP/PEEP 5.0      SITE Right Radial      ALLEN'S TEST PASS     GLUCOSE, POC    Collection Time: 11/18/19  5:10 AM   Result Value Ref Range    Glucose (POC) 259 (H) 65 - 100 mg/dL    Performed by Artemio Aly EMILY        XR CHEST PORT   Final Result   IMPRESSION: ET tube tip above the carina. A nasogastric tube is coiled within   the stomach. Subtle left basilar airspace disease, atelectasis versus    pneumonitis. Right lung relatively clear. No pneumothorax. Mild enlargement of   cardiopericardial silhouette.      MRI BRAIN W WO CONT   Final Result   IMPRESSION: Restricted diffusion in the posterior temporal frontal parietal and   occipital regions consistent with hypoxic ischemic encephalopathy. Sparing of   the basal ganglia, thalamus and posterior fossa. Differential diagnoses includes   PRES (posterior reversible encephalopathy syndrome). Repeat MRI in a week's time   is recommended.      Prominent microvascular ischemic changes as described above.      CT HEAD WO CONT   Final Result   IMPRESSION: Similar findings compared to CT head 11/12/2019.                  XR CHEST PORT   Final Result   Impression: Underexpanded lungs with bibasilar atelectasis.  No significant   interval change.         CTA HEAD   Final Result   IMPRESSION:   1.  No evidence of acute intracranial process on noncontrast CT head.   2.  No large vessel occlusion, hemodynamically significant luminal stenosis or   intracranial aneurysm.      XR CHEST PORT   Final Result   Impression:    No acute cardiopulmonary process.      NM LUNG SCAN PERF   Final Result   Impression:   Normal perfusion scintigraphy.         XR CHEST SNGL V   Final Result   IMPRESSION:   1.  The endotracheal tube tip overlies the tracheal airway approximately 37 mm   above the carina.   2.  The lungs are clear without acute pulmonary parenchymal pathology.        XR CHEST SNGL V   Final Result   IMPRESSION:   1.  The lung parenchyma is clear without acute pulmonary parenchymal pathology.   2.  There is milder ectasia of the thoracic aorta related to mild   atherosclerotic vascular changes.        CT HEAD WO CONT   Final Result   IMPRESSION:    1.  This examination is negative for acute intracranial pathology.   2.  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    3.  There is apparent proptosis more pronounced on the right compared to left.                CT SPINE CERV WO CONT   Final Result   Impression:    1.  This examination is  negative for an acute fracture or a subluxation injury   to the cervical spine.   2.  There is moderate degenerative disc disease at the C5-C6 level.   3.  There is right-sided facet arthropathy at the C2-C3 and the C3-C4 levels.   There is narrowing of the right neural foramen at C3-C4 level. There is   left-sided facet arthropathy primarily at the C2-C3 C4-C5 levels. There is   narrowing of the left C4-C5 neural foramen.            XR CHEST PORT    (Results Pending)   XR CHEST PORT    (Results Pending)   XR CHEST PORT    (Results Pending)        Assessment:  Severe encephalopathy, anoxic encephalopathy.  Suspect she had a cardiac arrest event at home with ROSC    Acute respiratory failure with hypoxia, requiring mechanical ventilation    Hypotension    Aspiration pneumonia    History of lung cancer, details unknown    Diabetes mellitus type 2    History of stroke      Plan:  Continue mechanical ventilation and supportive care  Continue IV antibiotics  Await family's decision regarding goals of care      Care Plan discussed with: Nurse    Total time spent with patient: 30 minutes.    Ludwig Clarks, MD

## 2019-11-18 NOTE — Progress Notes (Signed)
Bedside and Verbal shift change report given to Terri Salser RN (oncoming nurse) by Cora Marasigan RN (offgoing nurse). Report included the following information SBAR.

## 2019-11-18 NOTE — Progress Notes (Signed)
Pulmonology and Critical Care Progress Note    Subjective:       Patient seen and examined in ICU  Overnight events noted    Intubated on mechanical ventilation  Currently on assist control rate of 12, tidal volume 450, FiO2 24%, PEEP of 5    MRI of brain done showed anoxic brain changes.    Had CT scan of head done with contrast yesterday which did not show any malignant spread to the brain   remains on propofol To keep her Heart rate in range.     Patient dropped her blood pressure earlier and heart rate dropped as well.  Last night she was started on Diprivan for comfort.  Now she is on Levophed at 5 mics.  Not responding to stimuli per the nurse when taken off sedation      Review of Systems:  Review of systems not obtained due to patient factors.    Current Facility-Administered Medications   Medication Dose Route Frequency Provider Last Rate Last Admin   ??? NOREPINephrine (LEVOPHED) 8 mg in 5% dextrose (32 mcg/mL) infusion  0.5-30 mcg/min IntraVENous TITRATE Lieutenant Diego, MD 7.5 mL/hr at 11/17/19 0850 4 mcg/min at 11/17/19 0850   ??? glycopyrrolate (ROBINUL) injection 0.1 mg  0.1 mg IntraVENous TID PRN Lieutenant Diego, MD   0.1 mg at 11/16/19 2130   ??? insulin glargine (LANTUS) injection 5 Units  5 Units SubCUTAneous QHS Lieutenant Diego, MD   5 Units at 11/17/19 2114   ??? meropenem (MERREM) 1 g in sterile water (preservative free) 20 mL IV syringe  1 g IntraVENous Q12H Lieutenant Diego, MD   1 g at 11/18/19 0437   ??? metoprolol (LOPRESSOR) injection 5 mg  5 mg IntraVENous Q6H PRN Lieutenant Diego, MD       ??? propofol (DIPRIVAN) 10 mg/mL infusion  0-50 mcg/kg/min IntraVENous TITRATE Lieutenant Diego, MD 4.1 mL/hr at 11/17/19 1020 10 mcg/kg/min at 11/17/19 1020   ??? insulin lispro (HUMALOG) injection   SubCUTAneous Q6H Lieutenant Diego, MD   6 Units at 11/18/19 1143   ??? glucose chewable tablet 16 g  4 Tab Oral PRN Lieutenant Diego, MD        ??? glucagon (GLUCAGEN) injection 1 mg  1 mg IntraMUSCular PRN Lieutenant Diego, MD       ??? dextrose (D50W) injection syrg 12.5-25 g  25-50 mL IntraVENous PRN Roselyn Reef, Almedia Balls, MD       ??? chlorhexidine (PERIDEX) 0.12 % mouthwash 15 mL  15 mL Oral Q12H Kardar, Ahmed Marin Olp, MD   15 mL at 11/18/19 0859   ??? sodium chloride (NS) flush 5-40 mL  5-40 mL IntraVENous Q8H Bishop, Krysten, PA-C   10 mL at 11/18/19 1348   ??? sodium chloride (NS) flush 5-40 mL  5-40 mL IntraVENous PRN Bishop, Krysten, PA-C   10 mL at 11/11/19 0134   ??? acetaminophen (TYLENOL) tablet 650 mg  650 mg Oral Q6H PRN Bishop, Krysten, PA-C        Or   ??? acetaminophen (TYLENOL) suppository 650 mg  650 mg Rectal Q6H PRN Bishop, Krysten, PA-C       ??? ondansetron (ZOFRAN ODT) tablet 4 mg  4 mg Oral Q8H PRN Bishop, Krysten, PA-C        Or   ??? ondansetron (ZOFRAN) injection 4 mg  4 mg IntraVENous Q6H PRN Bishop, Krysten, PA-C   4 mg at 11/10/19 2346  No Known Allergies        Objective:     Blood pressure (!) 151/86, pulse (!) 106, temperature 98.4 ??F (36.9 ??C), resp. rate 14, height 5' 0.98" (1.549 m), weight 69.2 kg (152 lb 8.9 oz), SpO2 99 %. Temp (24hrs), Avg:98.2 ??F (36.8 ??C), Min:97.7 ??F (36.5 ??C), Max:98.6 ??F (37 ??C)      Intake and Output:  Current Shift: 01/09 0701 - 01/09 1900  In: 150   Out: -   Last 3 Shifts: 01/07 1901 - 01/09 0700  In: 1393.4 [I.V.:208.4]  Out: 0350 [Urine:1825]    Physical Exam:    General:  Lying in bed, intubated on mechanical ventilation. Eyes closed today   Neck: Supple  Lung:  Patient has good air entry bilaterally.  No wheezing was appreciated.   Heart: S1+S2.  No murmurs, tachycardia  Abdomen: soft, non-tender. Bowel sounds normal. No masses; obese  Extremities: No edema  GU: Not done  Skin: No cyanosis   Neurologic:  Sedated on mechanical ventilation.  No response to noxious stimuli.  Lab/Data Review:  Recent Results (from the past 24 hour(s))   GLUCOSE, POC    Collection Time: 11/17/19  6:13 PM    Result Value Ref Range    Glucose (POC) 237 (H) 65 - 100 mg/dL    Performed by Freddi Che    GLUCOSE, POC    Collection Time: 11/17/19  8:43 PM   Result Value Ref Range    Glucose (POC) 200 (H) 65 - 100 mg/dL    Performed by [place], POC    Collection Time: 11/17/19 11:15 PM   Result Value Ref Range    Glucose (POC) 265 (H) 65 - 100 mg/dL    Performed by Hulen Shouts EMILY    BLOOD GAS, ARTERIAL    Collection Time: 11/18/19  4:45 AM   Result Value Ref Range    pH 7.50 (H) 7.35 - 7.45      PCO2 37 35 - 45 mmHg    PO2 81 75 - 100 mmHg    O2 SAT 97 >95 %    BICARBONATE 29 (H) 22 - 26 mmol/L    BASE EXCESS 5.5 (H) 0 - 2 mmol/L    O2 METHOD VENT      FIO2 24.0 %    MODE Assist Control/Volume Control      Tidal volume 450      SET RATE 12      IPAP/PIP 0      EPAP/CPAP/PEEP 5.0      SITE Right Radial      ALLEN'S TEST PASS     GLUCOSE, POC    Collection Time: 11/18/19  5:10 AM   Result Value Ref Range    Glucose (POC) 259 (H) 65 - 100 mg/dL    Performed by Hulen Shouts EMILY    GLUCOSE, POC    Collection Time: 11/18/19 11:22 AM   Result Value Ref Range    Glucose (POC) 248 (H) 65 - 100 mg/dL    Performed by Dereck Ligas      chest X-ray      XR CHEST PORT   Final Result   IMPRESSION: ET tube tip above the carina. A nasogastric tube is coiled within   the stomach. Subtle left basilar airspace disease, atelectasis versus   pneumonitis. Right lung relatively clear. No pneumothorax. Mild enlargement of   cardiopericardial silhouette.      MRI BRAIN W WO CONT  Final Result   IMPRESSION: Restricted diffusion in the posterior temporal frontal parietal and   occipital regions consistent with hypoxic ischemic encephalopathy. Sparing of   the basal ganglia, thalamus and posterior fossa. Differential diagnoses includes   PRES (posterior reversible encephalopathy syndrome). Repeat MRI in a week's time   is recommended.      Prominent microvascular ischemic changes as described above.      CT HEAD WO CONT    Final Result   IMPRESSION: Similar findings compared to CT head 11/12/2019.                  XR CHEST PORT   Final Result   Impression: Underexpanded lungs with bibasilar atelectasis.  No significant   interval change.         CTA HEAD   Final Result   IMPRESSION:   1.  No evidence of acute intracranial process on noncontrast CT head.   2.  No large vessel occlusion, hemodynamically significant luminal stenosis or   intracranial aneurysm.      XR CHEST PORT   Final Result   Impression:    No acute cardiopulmonary process.      NM LUNG SCAN PERF   Final Result   Impression:   Normal perfusion scintigraphy.         XR CHEST SNGL V   Final Result   IMPRESSION:   1.  The endotracheal tube tip overlies the tracheal airway approximately 37 mm   above the carina.   2.  The lungs are clear without acute pulmonary parenchymal pathology.        XR CHEST SNGL V   Final Result   IMPRESSION:   1.  The lung parenchyma is clear without acute pulmonary parenchymal pathology.   2.  There is milder ectasia of the thoracic aorta related to mild   atherosclerotic vascular changes.        CT HEAD WO CONT   Final Result   IMPRESSION:    1.  This examination is negative for acute intracranial pathology.   2.  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   3.  There is apparent proptosis more pronounced on the right compared to left.                CT SPINE CERV WO CONT   Final Result   Impression:    1.  This examination is negative for an acute fracture or a subluxation injury   to the cervical spine.   2.  There is moderate degenerative disc disease at the C5-C6 level.   3.  There is right-sided facet arthropathy at the C2-C3 and the C3-C4 levels.   There is narrowing of the right neural foramen at C3-C4 level. There is   left-sided facet arthropathy primarily at the C2-C3 C4-C5 levels. There is   narrowing of the left C4-C5 neural foramen.             XR CHEST PORT    (Results Pending)   XR CHEST PORT    (Results Pending)   XR CHEST PORT    (Results Pending)       CT Results  (Last 48 hours)    None          Assessment:     1.  A 69 year old African-American female with multiple medical problems including history of prior stroke and lung cancer who has  been on chemotherapy, last chemotherapy in December 2020 , presented with unresponsiveness.      1.  Acute respiratory failure :  She was intubated enroute for airway protection.  Currently, she is off propofol.  She is minimally responsive, only opening eyes.  Apparently, she has a history of lung cancer.  Chest x-ray showed underinflation    Currently on assist control as outlined above  ABGs and chest x-ray reviewed  Ventilator settings modified, FiO2 decreased to 24%  ABG results from yesterday were reviewed which showed 7.5 1/33/83/27/97 on 24% FiO2  No further changes on the ventilator support.    2.  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    As discussed above,   Neurology is consulted.    Neurology noted that patient was posturing on right side.  CT scan of head with contrast was done which was unremarkable  MRI brain was done yesterday which showed ischemic changes in the brain    3.  History of lung cancer.    Patient has received last chemotherapy in December 2020, details are not available.    Chest x-ray, however, does not show any significant mass or nodule or other chronic changes.    4.  History of stroke.  5.  Diabetes mellitus.    Family coming in today for possible withdrawal of care, comfort care because of anoxic encephalopathy that is confirmed on MRI of the brain.    For deep venous thrombosis prophylaxis, the patient is started on heparin subcutaneously, which will be continued.  For gastrointestinal stress prophylaxis, she is started on Protonix IV which will be continued.    Family changed their mind yesterday and was made full code,    This morning after she dropped her blood pressure and heart rate started slowing down, family made patient chemical code.  Patient is on pressors.  She will remain on the ventilator as per family's wishes.     Chest x-ray and blood gas in the morning.    Thank you for involving me in the care of this patient  I will follow with you closely during hospitalization    This document has been prepared by the Dragon voice recognition system, typographical errors may have occurred. Attempts have been made to correct errors, however inadvertent errors may persist.    Gordy Councilman, MD  [facility])  11/18/2019

## 2019-11-18 NOTE — Progress Notes (Signed)
Comprehensive Nutrition Assessment    Type and Reason for Visit: Reassess(goal)    Nutrition Recommendations/Plan:   Continue TF via NG??of Promote at goal of??83ml/hr continuous  Add 1 pkt prosource daily??(60kcals, 15 g pro)  Free water flushes of 52ml q4h  Goal feeds provide 1380??kcals, 98g pro, and??1573ml fluids (meets??~100% needs)  ????????  Adjust insulin to promote euglycemia  ??  Document TF rate, protein modular provision, water flushes, and GRVs in EMR    Nutrition Assessment:  Admitted for unresponsiveness, intubated in ED.  Vomited pta, ?aspiration pna. Neuro following- CT head without acute findings, no evidence of mets to brain. EEG (1/4) showing mod-severe encephalopathy, MRI (1/5) finding hypoxic ischemia. Pt remains intubated, not requiring pressors. Sedation d/c 1/7, remains unresponsive off sedation. TF initiated 1/4, achieved goal rate same day. Remains at goal with limited interruptions, good tolerance of TF. Per MD note, awaiting family decision on care- RN reports likely to transition to comfort care today. RD to adjust TF recs pending POC. Labs reviewed. Meds: tatin, heparin, insulin, levofloxacin, meropenem, antiemetics, PPI, KCL.    Malnutrition Assessment:  Malnutrition Status:  No malnutrition    Context:  Acute illness       Estimated Daily Nutrient Needs:  Energy (kcal): 1380kcals (PSU 2003b); Weight Used for Energy Requirements: Current  Protein (g): 97g (1.5g/kg); Weight Used for Protein Requirements: Current  Fluid (ml/day): (adult minimum); Method Used for Fluid Requirements: Other (comment)      Nutrition Related Findings:  NFPE finding no signs of wasting. Generalized nonpitting edema. +vomiting pta and in ED. Last BM 1/7. Unknown hx dysphagia.      Wounds:    Full thickness, Deep tissue injury       Current Nutrition Therapies:  DIET NPO  DIET TUBE FEEDING Promote Water flush 50ml q4    Anthropometric Measures:  ?? Height:  5' 0.98" (154.9 cm)   ?? Current Body Wt:  64.5 kg (142 lb 3.2 oz)(1/2)   ?? Admission Body Wt:  142 lb 3.2 oz(1/2)    ?? Usual Body Wt:  (uta)     ?? Ideal Body Wt:  105 lbs:  135.4 %    ?? BMI Category:  Overweight (BMI 25.0-29.9)       Nutrition Diagnosis:   ?? Inadequate oral intake related to impaired respiratory function as evidenced by nutrition support-enteral nutrition      Nutrition Interventions:   Food and/or Nutrient Delivery: Continue NPO, Continue tube feeding  Nutrition Education and Counseling: No recommendations at this time, Education not appropriate  Coordination of Nutrition Care: Continue to monitor while inpatient    Goals:  intakes >/=75% of EENs in 7 days (progressing)  wt maintenance within +/-0.5kg in 7 days (uta)    Nutrition Monitoring and Evaluation:   Behavioral-Environmental Outcomes: None identified  Food/Nutrient Intake Outcomes: Enteral nutrition intake/tolerance  Physical Signs/Symptoms Outcomes: Weight, Nausea/vomiting    Discharge Planning:    Too soon to determine     Electronically signed by [name] on 11/19/2019 at 9:07 PM    Contact: [phone]

## 2019-11-18 NOTE — Progress Notes (Signed)
Progress Notes by Lieutenant Diego, MD at 11/18/19 [phone]                Author: Lieutenant Diego, MD  Service: Internal Medicine  Author Type: Physician       Filed: 11/18/19 0808  Date of Service: 11/18/19 0806  Status: Signed          Editor: Lieutenant Diego, MD (Physician)                              Hospitalist Progress Note                   Daily Progress Note: 11/18/2019           Subjective:     The patient is seen for follow up.   Condition is unchanged.   Patient had a transient episode yesterday where she dropped her blood pressure and heart rate.  Family decided to make  her chemical code after that.  I believe they are all coming in again today to make a decision regarding a transition to palliative care which would seem most appropriate      Problem List:      Problem List  as of 11/18/2019  Never Reviewed                        Codes  Class  Noted - Resolved             AMS (altered mental status)  ICD-10-CM: R41.82   ICD-9-CM: 780.97    11/10/2019 - Present                          Medications reviewed     Current Facility-Administered Medications          Medication  Dose  Route  Frequency           ?  NOREPINephrine (LEVOPHED) 8 mg in 5% dextrose (32 mcg/mL) infusion   0.5-30 mcg/min  IntraVENous  TITRATE     ?  glycopyrrolate (ROBINUL) injection 0.1 mg   0.1 mg  IntraVENous  TID PRN     ?  insulin glargine (LANTUS) injection 5 Units   5 Units  SubCUTAneous  QHS     ?  meropenem (MERREM) 1 g in sterile water (preservative free) 20 mL IV syringe   1 g  IntraVENous  Q12H     ?  metoprolol (LOPRESSOR) injection 5 mg   5 mg  IntraVENous  Q6H PRN     ?  propofol (DIPRIVAN) 10 mg/mL infusion   0-50 mcg/kg/min  IntraVENous  TITRATE     ?  insulin lispro (HUMALOG) injection     SubCUTAneous  Q6H     ?  glucose chewable tablet 16 g   4 Tab  Oral  PRN     ?  glucagon (GLUCAGEN) injection 1 mg   1 mg  IntraMUSCular  PRN     ?  dextrose (D50W) injection syrg 12.5-25 g   25-50 mL  IntraVENous  PRN      ?  chlorhexidine (PERIDEX) 0.12 % mouthwash 15 mL   15 mL  Oral  Q12H     ?  sodium chloride (NS) flush 5-40 mL   5-40 mL  IntraVENous  Q8H     ?  sodium chloride (NS) flush 5-40  mL   5-40 mL  IntraVENous  PRN     ?  acetaminophen (TYLENOL) tablet 650 mg   650 mg  Oral  Q6H PRN          Or           ?  acetaminophen (TYLENOL) suppository 650 mg   650 mg  Rectal  Q6H PRN     ?  ondansetron (ZOFRAN ODT) tablet 4 mg   4 mg  Oral  Q8H PRN          Or           ?  ondansetron (ZOFRAN) injection 4 mg   4 mg  IntraVENous  Q6H PRN             Review of Systems:     Review of systems not obtained due to patient factors.        Objective:     Physical Exam:       Visit Vitals      BP  (!) 152/90 (BP 1 Location: Right arm, BP Patient Position: At rest;Head of bed elevated (Comment degrees))        Pulse  100     Temp  97.7 ??F (36.5 ??C)     Resp  11     Ht  5' 0.98" (1.549 m)     Wt  69.2 kg (152 lb 8.9 oz)     SpO2  100%        BMI  28.84 kg/m??        O2 Device:  Ventilator      Temp (24hrs), Avg:98.2 ??F (36.8 ??C), Min:97.7 ??F (36.5 ??C), Max:98.6 ??F (37 ??C)     No intake/output data recorded.    01/07 1901 - 01/09 0700   In: 1393.4 [I.V.:208.4]   Out: 9147 [Urine:1825]         General:    Intubated, unresponsive        Lungs:    Clear to auscultation bilaterally.        Chest wall:   No tenderness or deformity.        Heart:   Regular rate and rhythm, S1, S2 normal, no murmur, click, rub or gallop.        Abdomen:    Soft, non-tender. Bowel sounds normal. No masses,  No organomegaly.     Extremities:  Extremities normal, atraumatic, no cyanosis or edema.     Pulses:  2+ and symmetric all extremities.     Skin:  Skin color, texture, turgor normal. No rashes or lesions        Neurologic:  CNII-XII intact.  Normal oculocephalic reflex.  No response to painful stimuli.  No withdrawal to pain                Data Review:          Recent Days:   No results for input(s): WBC, HGB, HCT, PLT, HGBEXT, HCTEXT, PLTEXT, HGBEXT, HCTEXT,  PLTEXT in the last 72 hours.   No results for input(s): NA, K, CL, CO2, GLU, BUN, CREA, CA, MG, PHOS, ALB, TBIL, TBILI, ALT, INR, INREXT, INREXT in the last 72 hours.      No lab exists for component: SGOT     Recent Labs           11/18/19   0445     PH  7.50*     PCO2  37     PO2  81     HCO3  29*        FIO2  24.0           24 Hour Results:     Recent Results (from the past 24 hour(s))     GLUCOSE, POC          Collection Time: 11/17/19  8:48 AM         Result  Value  Ref Range            Glucose (POC)  179 (H)  65 - 100 mg/dL       Performed by  Collene Leyden         EKG, 12 LEAD, SUBSEQUENT          Collection Time: 11/17/19  9:59 AM         Result  Value  Ref Range            Ventricular Rate  72  BPM       Atrial Rate  72  BPM       P-R Interval  196  ms       QRS Duration  84  ms       Q-T Interval  420  ms       QTC Calculation (Bezet)  459  ms       Calculated P Axis  28  degrees       Calculated R Axis  48  degrees       Calculated T Axis  93  degrees       Diagnosis                 Normal sinus rhythm   Nonspecific ST and T wave abnormality   Abnormal ECG   When compared with ECG of 10-Nov-2019 09:31,   Vent. rate has decreased BY  56 BPM   Right bundle branch block is no longer Present   Confirmed by [facility], FREDERICK ([id number]) on 11/17/2019 10:32:50 AM          GLUCOSE, POC          Collection Time: 11/17/19 12:21 PM         Result  Value  Ref Range            Glucose (POC)  323 (H)  65 - 100 mg/dL       Performed by  Delice Lesch         GLUCOSE, POC          Collection Time: 11/17/19  6:13 PM         Result  Value  Ref Range            Glucose (POC)  237 (H)  65 - 100 mg/dL       Performed by  Delice Lesch         GLUCOSE, POC          Collection Time: 11/17/19  8:43 PM         Result  Value  Ref Range            Glucose (POC)  200 (H)  65 - 100 mg/dL       Performed by  Artemio Aly EMILY         GLUCOSE, POC          Collection Time: 11/17/19 11:15 PM         Result  Value  Ref Range  Glucose (POC)  265 (H)  65 - 100 mg/dL       Performed by  Artemio AlySIROTTA EMILY         BLOOD GAS, ARTERIAL          Collection Time: 11/18/19  4:45 AM         Result  Value  Ref Range            pH  7.50 (H)  7.35 - 7.45         PCO2  37  35 - 45 mmHg       PO2  81  75 - 100 mmHg       O2 SAT  97  >95 %       BICARBONATE  29 (H)  22 - 26 mmol/L       BASE EXCESS  5.5 (H)  0 - 2 mmol/L       O2 METHOD  VENT          FIO2  24.0  %            MODE  Assist Control/Volume Control               Tidal volume  450          SET RATE  12          IPAP/PIP  0          EPAP/CPAP/PEEP  5.0          SITE  Right Radial          ALLEN'S TEST  PASS          GLUCOSE, POC          Collection Time: 11/18/19  5:10 AM         Result  Value  Ref Range            Glucose (POC)  259 (H)  65 - 100 mg/dL            Performed by  Artemio AlySIROTTA EMILY               XR CHEST PORT       Final Result     IMPRESSION: ET tube tip above the carina. A nasogastric tube is coiled within     the stomach. Subtle left basilar airspace disease, atelectasis versus     pneumonitis. Right lung relatively clear. No pneumothorax. Mild enlargement of     cardiopericardial silhouette.            MRI BRAIN W WO CONT       Final Result     IMPRESSION: Restricted diffusion in the posterior temporal frontal parietal and     occipital regions consistent with hypoxic ischemic encephalopathy. Sparing of     the basal ganglia, thalamus and posterior fossa. Differential diagnoses includes     PRES (posterior reversible encephalopathy syndrome). Repeat MRI in a week's time     is recommended.          Prominent microvascular ischemic changes as described above.            CT HEAD WO CONT       Final Result     IMPRESSION: Similar findings compared to CT head 11/12/2019.                                XR CHEST PORT  Final Result     Impression: Underexpanded lungs with bibasilar atelectasis.  No significant     interval change.                 CTA HEAD       Final Result      IMPRESSION:     1.  No evidence of acute intracranial process on noncontrast CT head.     2.  No large vessel occlusion, hemodynamically significant luminal stenosis or     intracranial aneurysm.            XR CHEST PORT       Final Result     Impression:      No acute cardiopulmonary process.            NM LUNG SCAN PERF       Final Result     Impression:     Normal perfusion scintigraphy.                 XR CHEST SNGL V       Final Result     IMPRESSION:     1.  The endotracheal tube tip overlies the tracheal airway approximately 37 mm     above the carina.     2.  The lungs are clear without acute pulmonary parenchymal pathology.              XR CHEST SNGL V       Final Result     IMPRESSION:     1.  The lung parenchyma is clear without acute pulmonary parenchymal pathology.     2.  There is milder ectasia of the thoracic aorta related to mild     atherosclerotic vascular changes.              CT HEAD WO CONT       Final Result     IMPRESSION:      1.  This examination is negative for acute intracranial pathology.     2.  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     3.  There is apparent proptosis more pronounced on the right compared to left.                            CT SPINE CERV WO CONT       Final Result     Impression:      1.  This examination is negative for an acute fracture or a subluxation injury     to the cervical spine.     2.  There is moderate degenerative disc disease at the C5-C6 level.     3.  There is right-sided facet arthropathy at the C2-C3 and the C3-C4 levels.     There is narrowing of the right neural foramen at C3-C4 level. There is     left-sided facet arthropathy primarily at the C2-C3 C4-C5 levels. There is     narrowing of the left C4-C5 neural foramen.                      XR CHEST PORT    (Results Pending)     XR CHEST PORT    (Results Pending)       XR CHEST PORT    (Results Pending)  Assessment:   Severe encephalopathy, anoxic encephalopathy.  Suspect she had a cardiac arrest event at home with ROSC      Acute respiratory failure with hypoxia, requiring mechanical ventilation      Hypotension      Aspiration pneumonia      History of lung cancer, details unknown      Diabetes mellitus type 2      History of stroke         Plan:   Continue mechanical ventilation and supportive care   Continue IV antibiotics   Await family's decision regarding goals of care         Care Plan discussed with: Nurse      Total time spent with patient: 30 minutes.      Lieutenant DiegoSteven R Raydin Bielinski, MD

## 2019-11-18 NOTE — Progress Notes (Signed)
Bedside and Verbal shift change report given to Marguerite Olea RN (oncoming nurse) by Daine Floras RN (offgoing nurse). Report included the following information SBAR.

## 2019-11-18 NOTE — Progress Notes (Signed)
 Comprehensive Nutrition Assessment    Type and Reason for Visit: Reassess(goal)    Nutrition Recommendations/Plan:   Continue TF via NGof Promote at goal of48ml/hr continuous  Add 1 pkt prosource daily(60kcals, 15 g pro)  Free water flushes of 75ml q4h  Goal feeds provide 1380kcals, 98g pro, and1536ml fluids (meets~100% needs)    Adjust insulin to promote euglycemia    Document TF rate, protein modular provision, water flushes, and GRVs in EMR    Nutrition Assessment:  Admitted for unresponsiveness, intubated in ED.  Vomited pta, ?aspiration pna. Neuro following- CT head without acute findings, no evidence of mets to brain. EEG (1/4) showing mod-severe encephalopathy, MRI (1/5) finding hypoxic ischemia. Pt remains intubated, not requiring pressors. Sedation d/c 1/7, remains unresponsive off sedation. TF initiated 1/4, achieved goal rate same day. Remains at goal with limited interruptions, good tolerance of TF. Per MD note, awaiting family decision on care- RN reports likely to transition to comfort care today. RD to adjust TF recs pending POC. Labs reviewed. Meds: tatin, heparin, insulin, levofloxacin, meropenem, antiemetics, PPI, KCL.    Malnutrition Assessment:  Malnutrition Status:  No malnutrition    Context:  Acute illness       Estimated Daily Nutrient Needs:  Energy (kcal): 1380kcals (PSU 2003b); Weight Used for Energy Requirements: Current  Protein (g): 97g (1.5g/kg); Weight Used for Protein Requirements: Current  Fluid (ml/day): (adult minimum); Method Used for Fluid Requirements: Other (comment)      Nutrition Related Findings:  NFPE finding no signs of wasting. Generalized nonpitting edema. +vomiting pta and in ED. Last BM 1/7. Unknown hx dysphagia.      Wounds:    Full thickness, Deep tissue injury       Current Nutrition Therapies:  DIET NPO  DIET TUBE FEEDING Promote Water flush 75ml q4    Anthropometric Measures:   Height:  5' 0.98 (154.9 cm)   Current Body Wt:  64.5 kg (142 lb  3.2 oz)(1/2)    Admission Body Wt:  142 lb 3.2 oz(1/2)     Usual Body Wt:  (uta)      Ideal Body Wt:  105 lbs:  135.4 %     BMI Category:  Overweight (BMI 25.0-29.9)       Nutrition Diagnosis:    Inadequate oral intake related to impaired respiratory function as evidenced by nutrition support-enteral nutrition      Nutrition Interventions:   Food and/or Nutrient Delivery: Continue NPO, Continue tube feeding  Nutrition Education and Counseling: No recommendations at this time, Education not appropriate  Coordination of Nutrition Care: Continue to monitor while inpatient    Goals:  intakes >/=75% of EENs in 7 days (progressing)  wt maintenance within +/-0.5kg in 7 days (uta)    Nutrition Monitoring and Evaluation:   Behavioral-Environmental Outcomes: None identified  Food/Nutrient Intake Outcomes: Enteral nutrition intake/tolerance  Physical Signs/Symptoms Outcomes: Weight, Nausea/vomiting    Discharge Planning:    Too soon to determine     Electronically signed by Schuyler Sheffield on 11/19/2019 at 9:07 PM    Contact: [phone]

## 2019-11-18 NOTE — Progress Notes (Signed)
Pulmonology and Critical Care Progress Note    Subjective:       Patient seen and examined in ICU  Overnight events noted    Intubated on mechanical ventilation  Currently on assist control rate of 12, tidal volume 450, FiO2 24%, PEEP of 5    MRI of brain done showed anoxic brain changes.    Had CT scan of head done with contrast yesterday which did not show any malignant spread to the brain   remains on propofol To keep her Heart rate in range.     Patient dropped her blood pressure earlier and heart rate dropped as well.  Last night she was started on Diprivan for comfort.  Now she is on Levophed at 5 mics.  Not responding to stimuli per the nurse when taken off sedation      Review of Systems:  Review of systems not obtained due to patient factors.    Current Facility-Administered Medications   Medication Dose Route Frequency Provider Last Rate Last Admin   ??? NOREPINephrine (LEVOPHED) 8 mg in 5% dextrose 226mL (32 mcg/mL) infusion  0.5-30 mcg/min IntraVENous TITRATE Ludwig Clarks, MD 7.5 mL/hr at 11/17/19 0850 4 mcg/min at 11/17/19 0850   ??? glycopyrrolate (ROBINUL) injection 0.1 mg  0.1 mg IntraVENous TID PRN Ludwig Clarks, MD   0.1 mg at 11/16/19 2130   ??? insulin glargine (LANTUS) injection 5 Units  5 Units SubCUTAneous QHS Ludwig Clarks, MD   5 Units at 11/17/19 2114   ??? meropenem (MERREM) 1 g in sterile water (preservative free) 20 mL IV syringe  1 g IntraVENous Q12H Ludwig Clarks, MD   1 g at 11/18/19 0437   ??? metoprolol (LOPRESSOR) injection 5 mg  5 mg IntraVENous Q6H PRN Ludwig Clarks, MD       ??? propofol (DIPRIVAN) 10 mg/mL infusion  0-50 mcg/kg/min IntraVENous TITRATE Ludwig Clarks, MD 4.1 mL/hr at 11/17/19 1020 10 mcg/kg/min at 11/17/19 1020   ??? insulin lispro (HUMALOG) injection   SubCUTAneous Q6H Ludwig Clarks, MD   6 Units at 11/18/19 1143   ??? glucose chewable tablet 16 g  4 Tab Oral PRN Ludwig Clarks, MD       ??? glucagon (GLUCAGEN) injection 1 mg  1 mg IntraMUSCular  PRN Ludwig Clarks, MD       ??? dextrose (D50W) injection syrg 12.5-25 g  25-50 mL IntraVENous PRN Celine Ahr, Doran Heater, MD       ??? chlorhexidine (PERIDEX) 0.12 % mouthwash 15 mL  15 mL Oral Q12H Kardar, Ahmed Mertie Moores, MD   15 mL at 11/18/19 0859   ??? sodium chloride (NS) flush 5-40 mL  5-40 mL IntraVENous Q8H Bishop, Krysten, PA-C   10 mL at 11/18/19 1348   ??? sodium chloride (NS) flush 5-40 mL  5-40 mL IntraVENous PRN Bishop, Krysten, PA-C   10 mL at 11/11/19 0134   ??? acetaminophen (TYLENOL) tablet 650 mg  650 mg Oral Q6H PRN Bishop, Krysten, PA-C        Or   ??? acetaminophen (TYLENOL) suppository 650 mg  650 mg Rectal Q6H PRN Bishop, Krysten, PA-C       ??? ondansetron (ZOFRAN ODT) tablet 4 mg  4 mg Oral Q8H PRN Bishop, Krysten, PA-C        Or   ??? ondansetron (ZOFRAN) injection 4 mg  4 mg IntraVENous Q6H PRN Bishop, Krysten, PA-C   4 mg at 11/10/19 2346  No Known Allergies        Objective:     Blood pressure (!) 151/86, pulse (!) 106, temperature 98.4 ??F (36.9 ??C), resp. rate 14, height 5' 0.98" (1.549 m), weight 69.2 kg (152 lb 8.9 oz), SpO2 99 %. Temp (24hrs), Avg:98.2 ??F (36.8 ??C), Min:97.7 ??F (36.5 ??C), Max:98.6 ??F (37 ??C)      Intake and Output:  Current Shift: 01/09 0701 - 01/09 1900  In: 150   Out: -   Last 3 Shifts: 01/07 1901 - 01/09 0700  In: 1393.4 [I.V.:208.4]  Out: 1825 [Urine:1825]    Physical Exam:    General:  Lying in bed, intubated on mechanical ventilation. Eyes closed today   Neck: Supple  Lung:  Patient has good air entry bilaterally.  No wheezing was appreciated.   Heart: S1+S2.  No murmurs, tachycardia  Abdomen: soft, non-tender. Bowel sounds normal. No masses; obese  Extremities: No edema  GU: Not done  Skin: No cyanosis   Neurologic:  Sedated on mechanical ventilation.  No response to noxious stimuli.  Lab/Data Review:  Recent Results (from the past 24 hour(s))   GLUCOSE, POC    Collection Time: 11/17/19  6:13 PM   Result Value Ref Range    Glucose (POC) 237 (H) 65 - 100 mg/dL     Performed by Delice Lesch    GLUCOSE, POC    Collection Time: 11/17/19  8:43 PM   Result Value Ref Range    Glucose (POC) 200 (H) 65 - 100 mg/dL    Performed by Artemio Aly EMILY    GLUCOSE, POC    Collection Time: 11/17/19 11:15 PM   Result Value Ref Range    Glucose (POC) 265 (H) 65 - 100 mg/dL    Performed by Artemio Aly EMILY    BLOOD GAS, ARTERIAL    Collection Time: 11/18/19  4:45 AM   Result Value Ref Range    pH 7.50 (H) 7.35 - 7.45      PCO2 37 35 - 45 mmHg    PO2 81 75 - 100 mmHg    O2 SAT 97 >95 %    BICARBONATE 29 (H) 22 - 26 mmol/L    BASE EXCESS 5.5 (H) 0 - 2 mmol/L    O2 METHOD VENT      FIO2 24.0 %    MODE Assist Control/Volume Control      Tidal volume 450      SET RATE 12      IPAP/PIP 0      EPAP/CPAP/PEEP 5.0      SITE Right Radial      ALLEN'S TEST PASS     GLUCOSE, POC    Collection Time: 11/18/19  5:10 AM   Result Value Ref Range    Glucose (POC) 259 (H) 65 - 100 mg/dL    Performed by Artemio Aly EMILY    GLUCOSE, POC    Collection Time: 11/18/19 11:22 AM   Result Value Ref Range    Glucose (POC) 248 (H) 65 - 100 mg/dL    Performed by Hoy Morn      chest X-ray      XR CHEST PORT   Final Result   IMPRESSION: ET tube tip above the carina. A nasogastric tube is coiled within   the stomach. Subtle left basilar airspace disease, atelectasis versus   pneumonitis. Right lung relatively clear. No pneumothorax. Mild enlargement of   cardiopericardial silhouette.      MRI BRAIN W WO CONT  Final Result   IMPRESSION: Restricted diffusion in the posterior temporal frontal parietal and   occipital regions consistent with hypoxic ischemic encephalopathy. Sparing of   the basal ganglia, thalamus and posterior fossa. Differential diagnoses includes   PRES (posterior reversible encephalopathy syndrome). Repeat MRI in a week's time   is recommended.      Prominent microvascular ischemic changes as described above.      CT HEAD WO CONT   Final Result   IMPRESSION: Similar findings compared to CT head 11/12/2019.                   XR CHEST PORT   Final Result   Impression: Underexpanded lungs with bibasilar atelectasis.  No significant   interval change.         CTA HEAD   Final Result   IMPRESSION:   1.  No evidence of acute intracranial process on noncontrast CT head.   2.  No large vessel occlusion, hemodynamically significant luminal stenosis or   intracranial aneurysm.      XR CHEST PORT   Final Result   Impression:    No acute cardiopulmonary process.      NM LUNG SCAN PERF   Final Result   Impression:   Normal perfusion scintigraphy.         XR CHEST SNGL V   Final Result   IMPRESSION:   1.  The endotracheal tube tip overlies the tracheal airway approximately 37 mm   above the carina.   2.  The lungs are clear without acute pulmonary parenchymal pathology.        XR CHEST SNGL V   Final Result   IMPRESSION:   1.  The lung parenchyma is clear without acute pulmonary parenchymal pathology.   2.  There is milder ectasia of the thoracic aorta related to mild   atherosclerotic vascular changes.        CT HEAD WO CONT   Final Result   IMPRESSION:    1.  This examination is negative for acute intracranial pathology.   2.  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   3.  There is apparent proptosis more pronounced on the right compared to left.                CT SPINE CERV WO CONT   Final Result   Impression:    1.  This examination is negative for an acute fracture or a subluxation injury   to the cervical spine.   2.  There is moderate degenerative disc disease at the C5-C6 level.   3.  There is right-sided facet arthropathy at the C2-C3 and the C3-C4 levels.   There is narrowing of the right neural foramen at C3-C4 level. There is   left-sided facet arthropathy primarily at the C2-C3 C4-C5 levels. There is   narrowing of the left C4-C5 neural foramen.            XR CHEST PORT    (Results Pending)   XR CHEST PORT    (Results Pending)   XR CHEST PORT     (Results Pending)       CT Results  (Last 48 hours)    None          Assessment:     1.  A 69 year old African-American female with multiple medical problems including history of prior stroke and lung cancer who has  been on chemotherapy, last chemotherapy in December 2020 , presented with unresponsiveness.      1.  Acute respiratory failure :  She was intubated enroute for airway protection.  Currently, she is off propofol.  She is minimally responsive, only opening eyes.  Apparently, she has a history of lung cancer.  Chest x-ray showed underinflation    Currently on assist control as outlined above  ABGs and chest x-ray reviewed  Ventilator settings modified, FiO2 decreased to 24%  ABG results from yesterday were reviewed which showed 7.5 1/33/83/27/97 on 24% FiO2  No further changes on the ventilator support.    2.  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    As discussed above,   Neurology is consulted.    Neurology noted that patient was posturing on right side.  CT scan of head with contrast was done which was unremarkable  MRI brain was done yesterday which showed ischemic changes in the brain    3.  History of lung cancer.    Patient has received last chemotherapy in December 2020, details are not available.    Chest x-ray, however, does not show any significant mass or nodule or other chronic changes.    4.  History of stroke.  5.  Diabetes mellitus.    Family coming in today for possible withdrawal of care, comfort care because of anoxic encephalopathy that is confirmed on MRI of the brain.    For deep venous thrombosis prophylaxis, the patient is started on heparin subcutaneously, which will be continued.  For gastrointestinal stress prophylaxis, she is started on Protonix IV which will be continued.    Family changed their mind yesterday and was made full code,   This morning after she dropped her blood pressure and heart rate started slowing down, family made patient chemical code.  Patient is  on pressors.  She will remain on the ventilator as per family's wishes.     Chest x-ray and blood gas in the morning.    Thank you for involving me in the care of this patient  I will follow with you closely during hospitalization    This document has been prepared by the Dragon voice recognition system, typographical errors may have occurred. Attempts have been made to correct errors, however inadvertent errors may persist.    Gordy Councilman, MD  [facility])  11/18/2019

## 2019-11-19 ENCOUNTER — Inpatient Hospital Stay: Admit: 2019-11-19 | Payer: MEDICARE | Primary: Adult Health

## 2019-11-19 LAB — BLOOD GAS, ARTERIAL
ALLEN'S TEST: POSITIVE
Allen Test: POSITIVE
BASE EXCESS: 5.3 mmol/L — ABNORMAL HIGH (ref 0–2)
BICARBONATE: 29 mmol/L — ABNORMAL HIGH (ref 22–26)
Base Excess: 5.3 mmol/L — ABNORMAL HIGH (ref 0–2)
EPAP/CPAP/PEEP: 5
EPAP/CPAP/PEEP: 5
FIO2: 24 %
FIO2: 24 %
HCO3: 29 mmol/L — ABNORMAL HIGH (ref 22–26)
O2 SAT: 97 % (ref >95)
O2 Sat: 97 % (ref >95)
PCO2: 36 mmHg (ref 35–45)
PCO2: 36 mmHg (ref 35–45)
PO2: 78 mmHg (ref 75–100)
PO2: 78 mmHg (ref 75–100)
SET RATE: 12
Set Rate: 12
pH: 7.5 — ABNORMAL HIGH (ref 7.35–7.45)
pH: 7.5 — ABNORMAL HIGH (ref 7.35–7.45)

## 2019-11-19 LAB — GLUCOSE, POC
Glucose (POC): 312 mg/dL — ABNORMAL HIGH (ref 65–100)
Glucose (POC): 320 mg/dL — ABNORMAL HIGH (ref 65–100)
Glucose (POC): 209 mg/dL — ABNORMAL HIGH (ref 65–100)
Glucose (POC): 260 mg/dL — ABNORMAL HIGH (ref 65–100)
Glucose (POC): 287 mg/dL — ABNORMAL HIGH (ref 65–100)

## 2019-11-19 LAB — POCT GLUCOSE
POC Glucose: 312 mg/dL — ABNORMAL HIGH (ref 65–100)
POC Glucose: 320 mg/dL — ABNORMAL HIGH (ref 65–100)
POC Glucose: 209 mg/dL — ABNORMAL HIGH (ref 65–100)
POC Glucose: 260 mg/dL — ABNORMAL HIGH (ref 65–100)
POC Glucose: 287 mg/dL — ABNORMAL HIGH (ref 65–100)

## 2019-11-19 MED FILL — HUMALOG U-100 INSULIN 100 UNIT/ML SUBCUTANEOUS SOLUTION: 100 unit/mL | SUBCUTANEOUS | Qty: 12

## 2019-11-19 MED FILL — LANTUS U-100 INSULIN 100 UNIT/ML SUBCUTANEOUS SOLUTION: 100 unit/mL | SUBCUTANEOUS | Qty: 5

## 2019-11-19 MED FILL — CHLORHEXIDINE GLUCONATE 0.12 % MOUTHWASH: 0.12 % | Qty: 15

## 2019-11-19 MED FILL — HUMALOG U-100 INSULIN 100 UNIT/ML SUBCUTANEOUS SOLUTION: 100 unit/mL | SUBCUTANEOUS | Qty: 9

## 2019-11-19 MED FILL — HUMALOG U-100 INSULIN 100 UNIT/ML SUBCUTANEOUS SOLUTION: 100 unit/mL | SUBCUTANEOUS | Qty: 3

## 2019-11-19 MED FILL — MEROPENEM 1 GRAM IV SOLR: 1 gram | INTRAVENOUS | Qty: 1

## 2019-11-19 NOTE — Progress Notes (Signed)
Hospitalist Progress Note               Daily Progress Note: 11/19/2019      Subjective:   The patient is seen for follow up.   Condition is unchanged.   I spoke with her daughter yesterday and she indicated the family be making a decision today regarding a transition to comfort care    Problem List:  Problem List as of 11/19/2019 Never Reviewed          Codes Class Noted - Resolved    AMS (altered mental status) ICD-10-CM: R41.82  ICD-9-CM: 780.97  11/10/2019 - Present              Medications reviewed  Current Facility-Administered Medications   Medication Dose Route Frequency   ??? NOREPINephrine (LEVOPHED) 8 mg in 5% dextrose (32 mcg/mL) infusion  0.5-30 mcg/min IntraVENous TITRATE   ??? glycopyrrolate (ROBINUL) injection 0.1 mg  0.1 mg IntraVENous TID PRN   ??? insulin glargine (LANTUS) injection 5 Units  5 Units SubCUTAneous QHS   ??? meropenem (MERREM) 1 g in sterile water (preservative free) 20 mL IV syringe  1 g IntraVENous Q12H   ??? metoprolol (LOPRESSOR) injection 5 mg  5 mg IntraVENous Q6H PRN   ??? propofol (DIPRIVAN) 10 mg/mL infusion  0-50 mcg/kg/min IntraVENous TITRATE   ??? insulin lispro (HUMALOG) injection   SubCUTAneous Q6H   ??? glucose chewable tablet 16 g  4 Tab Oral PRN   ??? glucagon (GLUCAGEN) injection 1 mg  1 mg IntraMUSCular PRN   ??? dextrose (D50W) injection syrg 12.5-25 g  25-50 mL IntraVENous PRN   ??? chlorhexidine (PERIDEX) 0.12 % mouthwash 15 mL  15 mL Oral Q12H   ??? sodium chloride (NS) flush 5-40 mL  5-40 mL IntraVENous Q8H   ??? sodium chloride (NS) flush 5-40 mL  5-40 mL IntraVENous PRN   ??? acetaminophen (TYLENOL) tablet 650 mg  650 mg Oral Q6H PRN    Or   ??? acetaminophen (TYLENOL) suppository 650 mg  650 mg Rectal Q6H PRN   ??? ondansetron (ZOFRAN ODT) tablet 4 mg  4 mg Oral Q8H PRN    Or   ??? ondansetron (ZOFRAN) injection 4 mg  4 mg IntraVENous Q6H PRN       Review of Systems:   Review of systems not obtained due to patient factors.    Objective:   Physical Exam:     Visit Vitals   BP 105/78   Pulse (!) 111   Temp 98.8 ??F (37.1 ??C)   Resp 13   Ht 5' 0.98" (1.549 m)   Wt 69 kg (152 lb 1.9 oz)   SpO2 100%   BMI 28.76 kg/m??      O2 Device: Ventilator    Temp (24hrs), Avg:98.5 ??F (36.9 ??C), Min:98 ??F (36.7 ??C), Max:98.8 ??F (37.1 ??C)    No intake/output data recorded.   01/08 1901 - 01/10 0700  In: 2164.2 [I.V.:149.2]  Out: 2325 [Urine:2325]    General:   Intubated, unresponsive   Lungs:   Clear to auscultation bilaterally.   Chest wall:  No tenderness or deformity.   Heart:  Regular rate and rhythm, S1, S2 normal, no murmur, click, rub or gallop.   Abdomen:   Soft, non-tender. Bowel sounds normal. No masses,  No organomegaly.   Extremities: Extremities normal, atraumatic, no cyanosis or edema.   Pulses: 2+ and symmetric all extremities.   Skin: Skin color, texture, turgor normal. No rashes or  lesions   Neurologic: CNII-XII intact.  Normal oculocephalic reflex.  No response to painful stimuli.  No withdrawal to pain         Data Review:       Recent Days:  No results for input(s): WBC, HGB, HCT, PLT, HGBEXT, HCTEXT, PLTEXT, HGBEXT, HCTEXT, PLTEXT in the last 72 hours.  No results for input(s): NA, K, CL, CO2, GLU, BUN, CREA, CA, MG, PHOS, ALB, TBIL, TBILI, ALT, INR, INREXT, INREXT in the last 72 hours.    No lab exists for component: SGOT  Recent Labs     11/19/19  0315 11/18/19  0445   PH 7.50* 7.50*   PCO2 36 37   PO2 78 81   HCO3 29* 29*   FIO2 24 24.0       24 Hour Results:  Recent Results (from the past 24 hour(s))   GLUCOSE, POC    Collection Time: 11/18/19 11:22 AM   Result Value Ref Range    Glucose (POC) 248 (H) 65 - 100 mg/dL    Performed by Hoy Morn    GLUCOSE, POC    Collection Time: 11/18/19  5:27 PM   Result Value Ref Range    Glucose (POC) 328 (H) 65 - 100 mg/dL    Performed by Marguerite Olea    GLUCOSE, POC    Collection Time: 11/18/19 11:21 PM   Result Value Ref Range    Glucose (POC) 312 (H) 65 - 100 mg/dL    Performed by Midge Aver    BLOOD GAS, ARTERIAL     Collection Time: 11/19/19  3:15 AM   Result Value Ref Range    pH 7.50 (H) 7.35 - 7.45      PCO2 36 35 - 45 mmHg    PO2 78 75 - 100 mmHg    O2 SAT 97 >95 %    BICARBONATE 29 (H) 22 - 26 mmol/L    BASE EXCESS 5.3 (H) 0 - 2 mmol/L    O2 METHOD VENT      FIO2 24 %    MODE Assist Control/Volume Control      SET RATE 12      EPAP/CPAP/PEEP 5      SITE Right Radial      ALLEN'S TEST Positive     GLUCOSE, POC    Collection Time: 11/19/19  5:27 AM   Result Value Ref Range    Glucose (POC) 320 (H) 65 - 100 mg/dL    Performed by Nicholaus Bloom STACY        XR CHEST PORT   Final Result   IMPRESSION: ET tube tip in 3-4 cm above the carina. Gastric tube coiled within   the proximal stomach. Gaseous distention of stomach. Mild enlargement of   cardiopericardial silhouette, slightly accentuated by rightward patient   obliquity. Subtle left basilar airspace opacities, improved from previous,   consistent with resolving atelectasis and/or pneumonia. No pneumothorax.      XR CHEST PORT   Final Result   IMPRESSION: ET tube tip above the carina. A nasogastric tube is coiled within   the stomach. Subtle left basilar airspace disease, atelectasis versus   pneumonitis. Right lung relatively clear. No pneumothorax. Mild enlargement of   cardiopericardial silhouette.      MRI BRAIN W WO CONT   Final Result   IMPRESSION: Restricted diffusion in the posterior temporal frontal parietal and   occipital regions consistent with hypoxic ischemic encephalopathy. Sparing of   the basal ganglia,  thalamus and posterior fossa. Differential diagnoses includes   PRES (posterior reversible encephalopathy syndrome). Repeat MRI in a week's time   is recommended.      Prominent microvascular ischemic changes as described above.      CT HEAD WO CONT   Final Result   IMPRESSION: Similar findings compared to CT head 11/12/2019.                  XR CHEST PORT   Final Result   Impression: Underexpanded lungs with bibasilar atelectasis.  No significant   interval change.          CTA HEAD   Final Result   IMPRESSION:   1.  No evidence of acute intracranial process on noncontrast CT head.   2.  No large vessel occlusion, hemodynamically significant luminal stenosis or   intracranial aneurysm.      XR CHEST PORT   Final Result   Impression:    No acute cardiopulmonary process.      NM LUNG SCAN PERF   Final Result   Impression:   Normal perfusion scintigraphy.         XR CHEST SNGL V   Final Result   IMPRESSION:   1.  The endotracheal tube tip overlies the tracheal airway approximately 37 mm   above the carina.   2.  The lungs are clear without acute pulmonary parenchymal pathology.        XR CHEST SNGL V   Final Result   IMPRESSION:   1.  The lung parenchyma is clear without acute pulmonary parenchymal pathology.   2.  There is milder ectasia of the thoracic aorta related to mild   atherosclerotic vascular changes.        CT HEAD WO CONT   Final Result   IMPRESSION:    1.  This examination is negative for acute intracranial pathology.   2.  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   3.  There is apparent proptosis more pronounced on the right compared to left.                CT SPINE CERV WO CONT   Final Result   Impression:    1.  This examination is negative for an acute fracture or a subluxation injury   to the cervical spine.   2.  There is moderate degenerative disc disease at the C5-C6 level.   3.  There is right-sided facet arthropathy at the C2-C3 and the C3-C4 levels.   There is narrowing of the right neural foramen at C3-C4 level. There is   left-sided facet arthropathy primarily at the C2-C3 C4-C5 levels. There is   narrowing of the left C4-C5 neural foramen.            XR CHEST PORT    (Results Pending)   XR CHEST PORT    (Results Pending)   XR CHEST PORT    (Results Pending)        Assessment:  Severe encephalopathy, anoxic encephalopathy.  Suspect she had a cardiac arrest event at home with ROSC     Acute respiratory failure with hypoxia, requiring mechanical ventilation    Hypotension    Aspiration pneumonia    History of lung cancer, details unknown    Diabetes mellitus type 2    History of stroke      Plan:  Continue mechanical ventilation and supportive care  Continue IV antibiotics  Await family's decision regarding comfort care and ventilator withdrawal      Care Plan discussed with: Nurse    Total time spent with patient: 30 minutes.    Lieutenant Diego, MD

## 2019-11-19 NOTE — Progress Notes (Signed)
Pulmonology and Critical Care Progress Note    Subjective:       Patient seen and examined in ICU  Overnight events noted    Intubated on mechanical ventilation  Currently on assist control rate of 12, tidal volume 450, FiO2 24%, PEEP of 5    MRI of brain done showed anoxic brain changes.    Had CT scan of head done with contrast yesterday which did not show any malignant spread to the brain   remains on propofol To keep her Heart rate in range.     Patient dropped her blood pressure earlier and heart rate dropped as well.  Last night she was started on Diprivan for comfort.   Not responding to stimuli per the nurse when taken off sedation      Review of Systems:  Review of systems not obtained due to patient factors.    Current Facility-Administered Medications   Medication Dose Route Frequency Provider Last Rate Last Admin   ??? NOREPINephrine (LEVOPHED) 8 mg in 5% dextrose 217mL (32 mcg/mL) infusion  0.5-30 mcg/min IntraVENous TITRATE Ludwig Clarks, MD 7.5 mL/hr at 11/17/19 0850 4 mcg/min at 11/17/19 0850   ??? glycopyrrolate (ROBINUL) injection 0.1 mg  0.1 mg IntraVENous TID PRN Ludwig Clarks, MD   0.1 mg at 11/16/19 2130   ??? insulin glargine (LANTUS) injection 5 Units  5 Units SubCUTAneous QHS Ludwig Clarks, MD   5 Units at 11/18/19 2201   ??? meropenem (MERREM) 1 g in sterile water (preservative free) 20 mL IV syringe  1 g IntraVENous Q12H Ludwig Clarks, MD   1 g at 11/19/19 1402   ??? metoprolol (LOPRESSOR) injection 5 mg  5 mg IntraVENous Q6H PRN Ludwig Clarks, MD       ??? propofol (DIPRIVAN) 10 mg/mL infusion  0-50 mcg/kg/min IntraVENous TITRATE Ludwig Clarks, MD 4.1 mL/hr at 11/17/19 1020 10 mcg/kg/min at 11/17/19 1020   ??? insulin lispro (HUMALOG) injection   SubCUTAneous Q6H Ludwig Clarks, MD   6 Units at 11/19/19 1104   ??? glucose chewable tablet 16 g  4 Tab Oral PRN Ludwig Clarks, MD       ??? glucagon (GLUCAGEN) injection 1 mg  1 mg IntraMUSCular PRN Ludwig Clarks, MD        ??? dextrose (D50W) injection syrg 12.5-25 g  25-50 mL IntraVENous PRN Celine Ahr, Doran Heater, MD       ??? chlorhexidine (PERIDEX) 0.12 % mouthwash 15 mL  15 mL Oral Q12H Kardar, Ahmed Mertie Moores, MD   15 mL at 11/19/19 2703   ??? sodium chloride (NS) flush 5-40 mL  5-40 mL IntraVENous Q8H Bishop, Krysten, PA-C   10 mL at 11/19/19 1355   ??? sodium chloride (NS) flush 5-40 mL  5-40 mL IntraVENous PRN Bishop, Krysten, PA-C   10 mL at 11/11/19 0134   ??? acetaminophen (TYLENOL) tablet 650 mg  650 mg Oral Q6H PRN Bishop, Krysten, PA-C        Or   ??? acetaminophen (TYLENOL) suppository 650 mg  650 mg Rectal Q6H PRN Bishop, Krysten, PA-C       ??? ondansetron (ZOFRAN ODT) tablet 4 mg  4 mg Oral Q8H PRN Bishop, Krysten, PA-C        Or   ??? ondansetron (ZOFRAN) injection 4 mg  4 mg IntraVENous Q6H PRN Bishop, Krysten, PA-C   4 mg at 11/10/19 2346  No Known Allergies        Objective:     Blood pressure 117/73, pulse (!) 104, temperature 98.6 ??F (37 ??C), resp. rate 12, height 5' 0.98" (1.549 m), weight 69 kg (152 lb 1.9 oz), SpO2 100 %. Temp (24hrs), Avg:98.5 ??F (36.9 ??C), Min:98 ??F (36.7 ??C), Max:98.8 ??F (37.1 ??C)      Intake and Output:  Current Shift: 01/10 0701 - 01/10 1900  In: 300   Out: 150 [Urine:150]  Last 3 Shifts: 01/08 1901 - 01/10 0700  In: 2239.2 [I.V.:149.2]  Out: 2325 [Urine:2325]    Physical Exam:    General:  Lying in bed, intubated on mechanical ventilation. Eyes closed today   Neck: Supple  Lung:  Patient has good air entry bilaterally.  No wheezing was appreciated.   Heart: S1+S2.  No murmurs, tachycardia  Abdomen: soft, non-tender. Bowel sounds normal. No masses; obese  Extremities: No edema  GU: Not done  Skin: No cyanosis   Neurologic:  Sedated on mechanical ventilation.  No response to noxious stimuli.  Lab/Data Review:  Recent Results (from the past 24 hour(s))   GLUCOSE, POC    Collection Time: 11/18/19  5:27 PM   Result Value Ref Range    Glucose (POC) 328 (H) 65 - 100 mg/dL     Performed by Marguerite Olea    GLUCOSE, POC    Collection Time: 11/18/19 11:21 PM   Result Value Ref Range    Glucose (POC) 312 (H) 65 - 100 mg/dL    Performed by Midge Aver    BLOOD GAS, ARTERIAL    Collection Time: 11/19/19  3:15 AM   Result Value Ref Range    pH 7.50 (H) 7.35 - 7.45      PCO2 36 35 - 45 mmHg    PO2 78 75 - 100 mmHg    O2 SAT 97 >95 %    BICARBONATE 29 (H) 22 - 26 mmol/L    BASE EXCESS 5.3 (H) 0 - 2 mmol/L    O2 METHOD VENT      FIO2 24 %    MODE Assist Control/Volume Control      SET RATE 12      EPAP/CPAP/PEEP 5      SITE Right Radial      ALLEN'S TEST Positive     GLUCOSE, POC    Collection Time: 11/19/19  5:27 AM   Result Value Ref Range    Glucose (POC) 320 (H) 65 - 100 mg/dL    Performed by Midge Aver    GLUCOSE, POC    Collection Time: 11/19/19 11:03 AM   Result Value Ref Range    Glucose (POC) 209 (H) 65 - 100 mg/dL    Performed by Kemper Durie STEPHANIE      chest X-ray      XR CHEST PORT   Final Result   IMPRESSION: ET tube tip in 3-4 cm above the carina. Gastric tube coiled within   the proximal stomach. Gaseous distention of stomach. Mild enlargement of   cardiopericardial silhouette, slightly accentuated by rightward patient   obliquity. Subtle left basilar airspace opacities, improved from previous,   consistent with resolving atelectasis and/or pneumonia. No pneumothorax.      XR CHEST PORT   Final Result   IMPRESSION: ET tube tip above the carina. A nasogastric tube is coiled within   the stomach. Subtle left basilar airspace disease, atelectasis versus   pneumonitis. Right lung relatively clear. No pneumothorax. Mild enlargement  of   cardiopericardial silhouette.      MRI BRAIN W WO CONT   Final Result   IMPRESSION: Restricted diffusion in the posterior temporal frontal parietal and   occipital regions consistent with hypoxic ischemic encephalopathy. Sparing of   the basal ganglia, thalamus and posterior fossa. Differential diagnoses includes    PRES (posterior reversible encephalopathy syndrome). Repeat MRI in a week's time   is recommended.      Prominent microvascular ischemic changes as described above.      CT HEAD WO CONT   Final Result   IMPRESSION: Similar findings compared to CT head 11/12/2019.                  XR CHEST PORT   Final Result   Impression: Underexpanded lungs with bibasilar atelectasis.  No significant   interval change.         CTA HEAD   Final Result   IMPRESSION:   1.  No evidence of acute intracranial process on noncontrast CT head.   2.  No large vessel occlusion, hemodynamically significant luminal stenosis or   intracranial aneurysm.      XR CHEST PORT   Final Result   Impression:    No acute cardiopulmonary process.      NM LUNG SCAN PERF   Final Result   Impression:   Normal perfusion scintigraphy.         XR CHEST SNGL V   Final Result   IMPRESSION:   1.  The endotracheal tube tip overlies the tracheal airway approximately 37 mm   above the carina.   2.  The lungs are clear without acute pulmonary parenchymal pathology.        XR CHEST SNGL V   Final Result   IMPRESSION:   1.  The lung parenchyma is clear without acute pulmonary parenchymal pathology.   2.  There is milder ectasia of the thoracic aorta related to mild   atherosclerotic vascular changes.        CT HEAD WO CONT   Final Result   IMPRESSION:    1.  This examination is negative for acute intracranial pathology.   2.  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   3.  There is apparent proptosis more pronounced on the right compared to left.                CT SPINE CERV WO CONT   Final Result   Impression:    1.  This examination is negative for an acute fracture or a subluxation injury   to the cervical spine.   2.  There is moderate degenerative disc disease at the C5-C6 level.   3.  There is right-sided facet arthropathy at the C2-C3 and the C3-C4 levels.    There is narrowing of the right neural foramen at C3-C4 level. There is   left-sided facet arthropathy primarily at the C2-C3 C4-C5 levels. There is   narrowing of the left C4-C5 neural foramen.            XR CHEST PORT    (Results Pending)   XR CHEST PORT    (Results Pending)   XR CHEST PORT    (Results Pending)       CT Results  (Last 48 hours)    None          Assessment:     1.  A  69 year old African-American female with multiple medical problems including history of prior stroke and lung cancer who has been on chemotherapy, last chemotherapy in December 2020 , presented with unresponsiveness.      1.  Acute respiratory failure :  She was intubated enroute for airway protection.  Currently, she is off propofol.  She is minimally responsive, only opening eyes.  Apparently, she has a history of lung cancer.  Chest x-ray showed underinflation    Currently on assist control as outlined above  ABGs and chest x-ray reviewed  Ventilator settings modified, FiO2 decreased to 24%  ABG results from yesterday were reviewed which showed 7.5 1/33/83/27/97 on 24% FiO2  No further changes on the ventilator support.    2.  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    As discussed above,   Neurology is consulted.    Neurology noted that patient was posturing on right side.  CT scan of head with contrast was done which was unremarkable  MRI brain was done yesterday which showed ischemic changes in the brain    3.  History of lung cancer.    Patient has received last chemotherapy in December 2020, details are not available.    Chest x-ray, however, does not show any significant mass or nodule or other chronic changes.    4.  History of stroke.  5.  Diabetes mellitus.    Family coming in today for possible withdrawal of care, comfort care because of anoxic encephalopathy that is confirmed on MRI of the brain.     For deep venous thrombosis prophylaxis, the patient is started on heparin subcutaneously, which will be continued.  For gastrointestinal stress prophylaxis, she is started on Protonix IV which will be continued.    Family changed their mind  and was made full code,   This morning after she dropped her blood pressure and heart rate started slowing down, family made patient chemical code.  Patient is on pressors.  She will remain on the ventilator as per family's wishes.   Patient's daughter is at her bedside and tells me that her brothers are not willing to go for comfort care at this time    Chest x-ray and blood gas in the morning.    Thank you for involving me in the care of this patient  I will follow with you closely during hospitalization    This document has been prepared by the Dragon voice recognition system, typographical errors may have occurred. Attempts have been made to correct errors, however inadvertent errors may persist.    Gordy Councilman, MD  [facility])  11/19/2019

## 2019-11-19 NOTE — Other (Addendum)
11/19/2019    I met with patient's daughter at the bedside.  Although she is ready to proceed with withdrawal of care and transition to palliative care, her brothers are not and in fact both of them refuse even to come in to see their mother at this time    She indicates that she does have a power of attorney although lost her paperwork at home    She has asked me to contact patient's PCP in [place], Dr. Cori Nafziger at [phone]; he apparently does have a copy of the POA paperwork    I will contact him first thing in the morning          Victorino Fatzinger R Beverlie Kurihara, MD

## 2019-11-19 NOTE — Progress Notes (Signed)
Bedside shift report given to Essence Johnson RN.

## 2019-11-19 NOTE — Progress Notes (Signed)
11/19/2019    I met with patient's daughter at the bedside.  Although she is ready to proceed with withdrawal of care and transition to palliative care, her brothers are not and in fact both of them refuse even to come in to see their mother at this time    She indicates that she does have a power of attorney although lost her paperwork at home    She has asked me to contact patient's PCP in [place], Dr. Soundra Pilon at [phone]; he apparently does have a copy of the POA paperwork    I will contact him first thing in the morning          Ludwig Clarks, MD

## 2019-11-19 NOTE — Progress Notes (Signed)
Progress Notes by Lieutenant DiegoFrelier, Jamier Urbas R, MD at 11/19/19 [phone]                Author: Lieutenant DiegoFrelier, Castulo Scarpelli R, MD  Service: Internal Medicine  Author Type: Physician       Filed: 11/19/19 0823  Date of Service: 11/19/19 0822  Status: Signed          Editor: Lieutenant DiegoFrelier, Rahma Meller R, MD (Physician)                              Hospitalist Progress Note                   Daily Progress Note: 11/19/2019           Subjective:     The patient is seen for follow up.   Condition is unchanged.   I spoke with her daughter yesterday and she indicated the family be making a decision today regarding a transition to  comfort care      Problem List:      Problem List  as of 11/19/2019  Never Reviewed                        Codes  Class  Noted - Resolved             AMS (altered mental status)  ICD-10-CM: R41.82   ICD-9-CM: 780.97    11/10/2019 - Present                          Medications reviewed     Current Facility-Administered Medications          Medication  Dose  Route  Frequency           ?  NOREPINephrine (LEVOPHED) 8 mg in 5% dextrose 250mL (32 mcg/mL) infusion   0.5-30 mcg/min  IntraVENous  TITRATE     ?  glycopyrrolate (ROBINUL) injection 0.1 mg   0.1 mg  IntraVENous  TID PRN     ?  insulin glargine (LANTUS) injection 5 Units   5 Units  SubCUTAneous  QHS     ?  meropenem (MERREM) 1 g in sterile water (preservative free) 20 mL IV syringe   1 g  IntraVENous  Q12H           ?  metoprolol (LOPRESSOR) injection 5 mg   5 mg  IntraVENous  Q6H PRN           ?  propofol (DIPRIVAN) 10 mg/mL infusion   0-50 mcg/kg/min  IntraVENous  TITRATE     ?  insulin lispro (HUMALOG) injection     SubCUTAneous  Q6H     ?  glucose chewable tablet 16 g   4 Tab  Oral  PRN     ?  glucagon (GLUCAGEN) injection 1 mg   1 mg  IntraMUSCular  PRN     ?  dextrose (D50W) injection syrg 12.5-25 g   25-50 mL  IntraVENous  PRN     ?  chlorhexidine (PERIDEX) 0.12 % mouthwash 15 mL   15 mL  Oral  Q12H     ?  sodium chloride (NS) flush 5-40 mL   5-40 mL  IntraVENous  Q8H      ?  sodium chloride (NS) flush 5-40 mL   5-40 mL  IntraVENous  PRN     ?  acetaminophen (  TYLENOL) tablet 650 mg   650 mg  Oral  Q6H PRN          Or           ?  acetaminophen (TYLENOL) suppository 650 mg   650 mg  Rectal  Q6H PRN     ?  ondansetron (ZOFRAN ODT) tablet 4 mg   4 mg  Oral  Q8H PRN          Or           ?  ondansetron (ZOFRAN) injection 4 mg   4 mg  IntraVENous  Q6H PRN             Review of Systems:     Review of systems not obtained due to patient factors.        Objective:     Physical Exam:       Visit Vitals      BP  105/78     Pulse  (!) 111     Temp  98.8 ??F (37.1 ??C)     Resp  13     Ht  5' 0.98" (1.549 m)     Wt  69 kg (152 lb 1.9 oz)     SpO2  100%        BMI  28.76 kg/m??        O2 Device:  Ventilator      Temp (24hrs), Avg:98.5 ??F (36.9 ??C), Min:98 ??F (36.7 ??C), Max:98.8 ??F (37.1 ??C)     No intake/output data recorded.    01/08 1901 - 01/10 0700   In: 2164.2 [I.V.:149.2]   Out: 2325 [Urine:2325]         General:    Intubated, unresponsive        Lungs:    Clear to auscultation bilaterally.        Chest wall:   No tenderness or deformity.        Heart:   Regular rate and rhythm, S1, S2 normal, no murmur, click, rub or gallop.        Abdomen:    Soft, non-tender. Bowel sounds normal. No masses,  No organomegaly.     Extremities:  Extremities normal, atraumatic, no cyanosis or edema.     Pulses:  2+ and symmetric all extremities.     Skin:  Skin color, texture, turgor normal. No rashes or lesions        Neurologic:  CNII-XII intact.  Normal oculocephalic reflex.  No response to painful stimuli.  No withdrawal to pain                Data Review:          Recent Days:   No results for input(s): WBC, HGB, HCT, PLT, HGBEXT, HCTEXT, PLTEXT, HGBEXT, HCTEXT, PLTEXT in the last 72 hours.   No results for input(s): NA, K, CL, CO2, GLU, BUN, CREA, CA, MG, PHOS, ALB, TBIL, TBILI, ALT, INR, INREXT, INREXT in the last 72 hours.      No lab exists for component: SGOT     Recent Labs            11/19/19    0315  11/18/19   0445     PH  7.50*  7.50*     PCO2  36  37     PO2  78  81     HCO3  29*  29*         FIO2  24  24.0           24 Hour Results:     Recent Results (from the past 24 hour(s))     GLUCOSE, POC          Collection Time: 11/18/19 11:22 AM         Result  Value  Ref Range            Glucose (POC)  248 (H)  65 - 100 mg/dL       Performed by  Hoy Morn         GLUCOSE, POC          Collection Time: 11/18/19  5:27 PM         Result  Value  Ref Range            Glucose (POC)  328 (H)  65 - 100 mg/dL       Performed by  Marguerite Olea         GLUCOSE, POC          Collection Time: 11/18/19 11:21 PM         Result  Value  Ref Range            Glucose (POC)  312 (H)  65 - 100 mg/dL       Performed by  Midge Aver         BLOOD GAS, ARTERIAL          Collection Time: 11/19/19  3:15 AM         Result  Value  Ref Range            pH  7.50 (H)  7.35 - 7.45         PCO2  36  35 - 45 mmHg       PO2  78  75 - 100 mmHg       O2 SAT  97  >95 %       BICARBONATE  29 (H)  22 - 26 mmol/L       BASE EXCESS  5.3 (H)  0 - 2 mmol/L       O2 METHOD  VENT          FIO2  24  %       MODE  Assist Control/Volume Control          SET RATE  12          EPAP/CPAP/PEEP  5          SITE  Right Radial          ALLEN'S TEST  Positive          GLUCOSE, POC          Collection Time: 11/19/19  5:27 AM         Result  Value  Ref Range            Glucose (POC)  320 (H)  65 - 100 mg/dL            Performed by  Nicholaus Bloom STACY               XR CHEST PORT       Final Result     IMPRESSION: ET tube tip in 3-4 cm above the carina. Gastric tube coiled within     the proximal stomach. Gaseous distention of stomach. Mild enlargement of     cardiopericardial silhouette, slightly accentuated by rightward patient  obliquity. Subtle left basilar airspace opacities, improved from previous,     consistent with resolving atelectasis and/or pneumonia. No pneumothorax.            XR CHEST PORT       Final Result     IMPRESSION: ET tube tip above the  carina. A nasogastric tube is coiled within     the stomach. Subtle left basilar airspace disease, atelectasis versus     pneumonitis. Right lung relatively clear. No pneumothorax. Mild enlargement of     cardiopericardial silhouette.            MRI BRAIN W WO CONT       Final Result     IMPRESSION: Restricted diffusion in the posterior temporal frontal parietal and     occipital regions consistent with hypoxic ischemic encephalopathy. Sparing of     the basal ganglia, thalamus and posterior fossa. Differential diagnoses includes     PRES (posterior reversible encephalopathy syndrome). Repeat MRI in a week's time     is recommended.          Prominent microvascular ischemic changes as described above.            CT HEAD WO CONT       Final Result     IMPRESSION: Similar findings compared to CT head 11/12/2019.                                XR CHEST PORT       Final Result     Impression: Underexpanded lungs with bibasilar atelectasis.  No significant     interval change.                 CTA HEAD       Final Result     IMPRESSION:     1.  No evidence of acute intracranial process on noncontrast CT head.     2.  No large vessel occlusion, hemodynamically significant luminal stenosis or     intracranial aneurysm.            XR CHEST PORT       Final Result     Impression:      No acute cardiopulmonary process.            NM LUNG SCAN PERF       Final Result     Impression:     Normal perfusion scintigraphy.                 XR CHEST SNGL V       Final Result     IMPRESSION:     1.  The endotracheal tube tip overlies the tracheal airway approximately 37 mm     above the carina.     2.  The lungs are clear without acute pulmonary parenchymal pathology.              XR CHEST SNGL V       Final Result     IMPRESSION:     1.  The lung parenchyma is clear without acute pulmonary parenchymal pathology.     2.  There is milder ectasia of the thoracic aorta related to mild     atherosclerotic vascular changes.              CT  HEAD WO CONT       Final Result  IMPRESSION:      1.  This examination is negative for acute intracranial pathology.     2.  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     3.  There is apparent proptosis more pronounced on the right compared to left.                            CT SPINE CERV WO CONT       Final Result     Impression:      1.  This examination is negative for an acute fracture or a subluxation injury     to the cervical spine.     2.  There is moderate degenerative disc disease at the C5-C6 level.     3.  There is right-sided facet arthropathy at the C2-C3 and the C3-C4 levels.     There is narrowing of the right neural foramen at C3-C4 level. There is     left-sided facet arthropathy primarily at the C2-C3 C4-C5 levels. There is     narrowing of the left C4-C5 neural foramen.                      XR CHEST PORT    (Results Pending)     XR CHEST PORT    (Results Pending)       XR CHEST PORT    (Results Pending)            Assessment:   Severe encephalopathy, anoxic encephalopathy.  Suspect she had a cardiac arrest event at home with ROSC      Acute respiratory failure with hypoxia, requiring mechanical ventilation      Hypotension      Aspiration pneumonia      History of lung cancer, details unknown      Diabetes mellitus type 2      History of stroke         Plan:   Continue mechanical ventilation and supportive care   Continue IV antibiotics   Await family's decision regarding comfort care and ventilator withdrawal         Care Plan discussed with: Nurse      Total time spent with patient: 30 minutes.      Lieutenant Diego, MD

## 2019-11-19 NOTE — Progress Notes (Signed)
Pulmonology and Critical Care Progress Note    Subjective:       Patient seen and examined in ICU  Overnight events noted    Intubated on mechanical ventilation  Currently on assist control rate of 12, tidal volume 450, FiO2 24%, PEEP of 5    MRI of brain done showed anoxic brain changes.    Had CT scan of head done with contrast yesterday which did not show any malignant spread to the brain   remains on propofol To keep her Heart rate in range.     Patient dropped her blood pressure earlier and heart rate dropped as well.  Last night she was started on Diprivan for comfort.   Not responding to stimuli per the nurse when taken off sedation      Review of Systems:  Review of systems not obtained due to patient factors.    Current Facility-Administered Medications   Medication Dose Route Frequency Provider Last Rate Last Admin   ??? NOREPINephrine (LEVOPHED) 8 mg in 5% dextrose (32 mcg/mL) infusion  0.5-30 mcg/min IntraVENous TITRATE Lieutenant Diego, MD 7.5 mL/hr at 11/17/19 0850 4 mcg/min at 11/17/19 0850   ??? glycopyrrolate (ROBINUL) injection 0.1 mg  0.1 mg IntraVENous TID PRN Lieutenant Diego, MD   0.1 mg at 11/16/19 2130   ??? insulin glargine (LANTUS) injection 5 Units  5 Units SubCUTAneous QHS Lieutenant Diego, MD   5 Units at 11/18/19 2201   ??? meropenem (MERREM) 1 g in sterile water (preservative free) 20 mL IV syringe  1 g IntraVENous Q12H Lieutenant Diego, MD   1 g at 11/19/19 1402   ??? metoprolol (LOPRESSOR) injection 5 mg  5 mg IntraVENous Q6H PRN Lieutenant Diego, MD       ??? propofol (DIPRIVAN) 10 mg/mL infusion  0-50 mcg/kg/min IntraVENous TITRATE Lieutenant Diego, MD 4.1 mL/hr at 11/17/19 1020 10 mcg/kg/min at 11/17/19 1020   ??? insulin lispro (HUMALOG) injection   SubCUTAneous Q6H Lieutenant Diego, MD   6 Units at 11/19/19 1104   ??? glucose chewable tablet 16 g  4 Tab Oral PRN Lieutenant Diego, MD       ??? glucagon (GLUCAGEN) injection 1 mg  1 mg IntraMUSCular PRN Lieutenant Diego, MD       ???  dextrose (D50W) injection syrg 12.5-25 g  25-50 mL IntraVENous PRN Roselyn Reef, Almedia Balls, MD       ??? chlorhexidine (PERIDEX) 0.12 % mouthwash 15 mL  15 mL Oral Q12H Kardar, Ahmed Marin Olp, MD   15 mL at 11/19/19 4481   ??? sodium chloride (NS) flush 5-40 mL  5-40 mL IntraVENous Q8H Bishop, Krysten, PA-C   10 mL at 11/19/19 1355   ??? sodium chloride (NS) flush 5-40 mL  5-40 mL IntraVENous PRN Bishop, Krysten, PA-C   10 mL at 11/11/19 0134   ??? acetaminophen (TYLENOL) tablet 650 mg  650 mg Oral Q6H PRN Bishop, Krysten, PA-C        Or   ??? acetaminophen (TYLENOL) suppository 650 mg  650 mg Rectal Q6H PRN Bishop, Krysten, PA-C       ??? ondansetron (ZOFRAN ODT) tablet 4 mg  4 mg Oral Q8H PRN Bishop, Krysten, PA-C        Or   ??? ondansetron (ZOFRAN) injection 4 mg  4 mg IntraVENous Q6H PRN Bishop, Krysten, PA-C   4 mg at 11/10/19 2346  No Known Allergies        Objective:     Blood pressure 117/73, pulse (!) 104, temperature 98.6 ??F (37 ??C), resp. rate 12, height 5' 0.98" (1.549 m), weight 69 kg (152 lb 1.9 oz), SpO2 100 %. Temp (24hrs), Avg:98.5 ??F (36.9 ??C), Min:98 ??F (36.7 ??C), Max:98.8 ??F (37.1 ??C)      Intake and Output:  Current Shift: 01/10 0701 - 01/10 1900  In: 300   Out: 150 [Urine:150]  Last 3 Shifts: 01/08 1901 - 01/10 0700  In: 2239.2 [I.V.:149.2]  Out: 2325 [Urine:2325]    Physical Exam:    General:  Lying in bed, intubated on mechanical ventilation. Eyes closed today   Neck: Supple  Lung:  Patient has good air entry bilaterally.  No wheezing was appreciated.   Heart: S1+S2.  No murmurs, tachycardia  Abdomen: soft, non-tender. Bowel sounds normal. No masses; obese  Extremities: No edema  GU: Not done  Skin: No cyanosis   Neurologic:  Sedated on mechanical ventilation.  No response to noxious stimuli.  Lab/Data Review:  Recent Results (from the past 24 hour(s))   GLUCOSE, POC    Collection Time: 11/18/19  5:27 PM   Result Value Ref Range    Glucose (POC) 328 (H) 65 - 100 mg/dL    Performed by Marguerite Olea     GLUCOSE, POC    Collection Time: 11/18/19 11:21 PM   Result Value Ref Range    Glucose (POC) 312 (H) 65 - 100 mg/dL    Performed by Midge Aver    BLOOD GAS, ARTERIAL    Collection Time: 11/19/19  3:15 AM   Result Value Ref Range    pH 7.50 (H) 7.35 - 7.45      PCO2 36 35 - 45 mmHg    PO2 78 75 - 100 mmHg    O2 SAT 97 >95 %    BICARBONATE 29 (H) 22 - 26 mmol/L    BASE EXCESS 5.3 (H) 0 - 2 mmol/L    O2 METHOD VENT      FIO2 24 %    MODE Assist Control/Volume Control      SET RATE 12      EPAP/CPAP/PEEP 5      SITE Right Radial      ALLEN'S TEST Positive     GLUCOSE, POC    Collection Time: 11/19/19  5:27 AM   Result Value Ref Range    Glucose (POC) 320 (H) 65 - 100 mg/dL    Performed by Midge Aver    GLUCOSE, POC    Collection Time: 11/19/19 11:03 AM   Result Value Ref Range    Glucose (POC) 209 (H) 65 - 100 mg/dL    Performed by Kemper Durie STEPHANIE      chest X-ray      XR CHEST PORT   Final Result   IMPRESSION: ET tube tip in 3-4 cm above the carina. Gastric tube coiled within   the proximal stomach. Gaseous distention of stomach. Mild enlargement of   cardiopericardial silhouette, slightly accentuated by rightward patient   obliquity. Subtle left basilar airspace opacities, improved from previous,   consistent with resolving atelectasis and/or pneumonia. No pneumothorax.      XR CHEST PORT   Final Result   IMPRESSION: ET tube tip above the carina. A nasogastric tube is coiled within   the stomach. Subtle left basilar airspace disease, atelectasis versus   pneumonitis. Right lung relatively clear. No pneumothorax. Mild enlargement  of   cardiopericardial silhouette.      MRI BRAIN W WO CONT   Final Result   IMPRESSION: Restricted diffusion in the posterior temporal frontal parietal and   occipital regions consistent with hypoxic ischemic encephalopathy. Sparing of   the basal ganglia, thalamus and posterior fossa. Differential diagnoses includes   PRES (posterior reversible encephalopathy syndrome). Repeat MRI in  a week's time   is recommended.      Prominent microvascular ischemic changes as described above.      CT HEAD WO CONT   Final Result   IMPRESSION: Similar findings compared to CT head 11/12/2019.                  XR CHEST PORT   Final Result   Impression: Underexpanded lungs with bibasilar atelectasis.  No significant   interval change.         CTA HEAD   Final Result   IMPRESSION:   1.  No evidence of acute intracranial process on noncontrast CT head.   2.  No large vessel occlusion, hemodynamically significant luminal stenosis or   intracranial aneurysm.      XR CHEST PORT   Final Result   Impression:    No acute cardiopulmonary process.      NM LUNG SCAN PERF   Final Result   Impression:   Normal perfusion scintigraphy.         XR CHEST SNGL V   Final Result   IMPRESSION:   1.  The endotracheal tube tip overlies the tracheal airway approximately 37 mm   above the carina.   2.  The lungs are clear without acute pulmonary parenchymal pathology.        XR CHEST SNGL V   Final Result   IMPRESSION:   1.  The lung parenchyma is clear without acute pulmonary parenchymal pathology.   2.  There is milder ectasia of the thoracic aorta related to mild   atherosclerotic vascular changes.        CT HEAD WO CONT   Final Result   IMPRESSION:    1.  This examination is negative for acute intracranial pathology.   2.  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   3.  There is apparent proptosis more pronounced on the right compared to left.                CT SPINE CERV WO CONT   Final Result   Impression:    1.  This examination is negative for an acute fracture or a subluxation injury   to the cervical spine.   2.  There is moderate degenerative disc disease at the C5-C6 level.   3.  There is right-sided facet arthropathy at the C2-C3 and the C3-C4 levels.   There is narrowing of the right neural foramen at C3-C4 level. There is   left-sided facet arthropathy  primarily at the C2-C3 C4-C5 levels. There is   narrowing of the left C4-C5 neural foramen.            XR CHEST PORT    (Results Pending)   XR CHEST PORT    (Results Pending)   XR CHEST PORT    (Results Pending)       CT Results  (Last 48 hours)    None          Assessment:     1.  A  69 year old African-American female with multiple medical problems including history of prior stroke and lung cancer who has been on chemotherapy, last chemotherapy in December 2020 , presented with unresponsiveness.      1.  Acute respiratory failure :  She was intubated enroute for airway protection.  Currently, she is off propofol.  She is minimally responsive, only opening eyes.  Apparently, she has a history of lung cancer.  Chest x-ray showed underinflation    Currently on assist control as outlined above  ABGs and chest x-ray reviewed  Ventilator settings modified, FiO2 decreased to 24%  ABG results from yesterday were reviewed which showed 7.5 1/33/83/27/97 on 24% FiO2  No further changes on the ventilator support.    2.  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    As discussed above,   Neurology is consulted.    Neurology noted that patient was posturing on right side.  CT scan of head with contrast was done which was unremarkable  MRI brain was done yesterday which showed ischemic changes in the brain    3.  History of lung cancer.    Patient has received last chemotherapy in December 2020, details are not available.    Chest x-ray, however, does not show any significant mass or nodule or other chronic changes.    4.  History of stroke.  5.  Diabetes mellitus.    Family coming in today for possible withdrawal of care, comfort care because of anoxic encephalopathy that is confirmed on MRI of the brain.    For deep venous thrombosis prophylaxis, the patient is started on heparin subcutaneously, which will be continued.  For gastrointestinal stress prophylaxis, she is started on Protonix IV which will be  continued.    Family changed their mind  and was made full code,   This morning after she dropped her blood pressure and heart rate started slowing down, family made patient chemical code.  Patient is on pressors.  She will remain on the ventilator as per family's wishes.   Patient's daughter is at her bedside and tells me that her brothers are not willing to go for comfort care at this time    Chest x-ray and blood gas in the morning.    Thank you for involving me in the care of this patient  I will follow with you closely during hospitalization    This document has been prepared by the Dragon voice recognition system, typographical errors may have occurred. Attempts have been made to correct errors, however inadvertent errors may persist.    Candie Echevaria, MD  Pulmonary  [facility] ( [facility])  11/19/2019

## 2019-11-20 ENCOUNTER — Inpatient Hospital Stay: Admit: 2019-11-20 | Payer: MEDICARE | Primary: Adult Health

## 2019-11-20 LAB — BLOOD GAS, ARTERIAL
BASE EXCESS: 6.4 mmol/L — ABNORMAL HIGH (ref 0–2)
BICARBONATE: 30 mmol/L — ABNORMAL HIGH (ref 22–26)
Base Excess: 6.4 mmol/L — ABNORMAL HIGH (ref 0–2)
EPAP/CPAP/PEEP: 5
EPAP/CPAP/PEEP: 5
FIO2: 24 %
FIO2: 24 %
HCO3: 30 mmol/L — ABNORMAL HIGH (ref 22–26)
IPAP/PIP: 0
IPAP/PIP: 0
O2 SAT: 98 % (ref >95)
O2 Sat: 98 % (ref >95)
PCO2: 37 mmHg (ref 35–45)
PCO2: 37 mmHg (ref 35–45)
PO2: 81 mmHg (ref 75–100)
PO2: 81 mmHg (ref 75–100)
SET RATE: 12
Set Rate: 12
Tidal Volume: 450
Tidal volume: 450
pH: 7.51 — ABNORMAL HIGH (ref 7.35–7.45)
pH: 7.51 — ABNORMAL HIGH (ref 7.35–7.45)

## 2019-11-20 LAB — GLUCOSE, POC
Glucose (POC): 257 mg/dL — ABNORMAL HIGH (ref 65–100)
Glucose (POC): 256 mg/dL — ABNORMAL HIGH (ref 65–100)
Glucose (POC): 256 mg/dL — ABNORMAL HIGH (ref 65–100)
Glucose (POC): 270 mg/dL — ABNORMAL HIGH (ref 65–100)

## 2019-11-20 LAB — POCT GLUCOSE
POC Glucose: 257 mg/dL — ABNORMAL HIGH (ref 65–100)
POC Glucose: 256 mg/dL — ABNORMAL HIGH (ref 65–100)
POC Glucose: 256 mg/dL — ABNORMAL HIGH (ref 65–100)
POC Glucose: 270 mg/dL — ABNORMAL HIGH (ref 65–100)

## 2019-11-20 MED ORDER — MORPHINE CONCENTRATE 20 MG/ML ORAL
100 mg/5 mL (20 mg/mL) | ORAL | 0 refills | Status: AC | PRN
Start: 2019-11-20 — End: 2019-11-23

## 2019-11-20 MED FILL — HUMALOG U-100 INSULIN 100 UNIT/ML SUBCUTANEOUS SOLUTION: 100 unit/mL | SUBCUTANEOUS | Qty: 9

## 2019-11-20 MED FILL — MEROPENEM 1 GRAM IV SOLR: 1 gram | INTRAVENOUS | Qty: 1

## 2019-11-20 MED FILL — LANTUS U-100 INSULIN 100 UNIT/ML SUBCUTANEOUS SOLUTION: 100 unit/mL | SUBCUTANEOUS | Qty: 5

## 2019-11-20 MED FILL — CHLORHEXIDINE GLUCONATE 0.12 % MOUTHWASH: 0.12 % | Qty: 15

## 2019-11-20 NOTE — Telephone Encounter (Signed)
Patient's daughter Caryl Asp" Evette Doffing is calling to let Tommi Rumps know that Hospice is going to step in on behalf of the patient. The patient will be transitioning home. However is still on the ventilator at this point.    Hospice will get in touch with [facility]. But if wants to get in touch with hospice first regarding orders.  [phone]   Caryl Asp Patient's daughter- [phone]

## 2019-11-20 NOTE — Progress Notes (Addendum)
Incoming call from Dr. Frelier stating the family has decided to bring the patient home on hospice. Family has already been in contact with [facility]. CM will send referral to [facility] and make arrangements for patient to discharge in the morning on hospice at home. Once extubated, patient will need to be on <10L NC.    ADDENDUM 426pm  Per primary nurse, Essence RN, patient will be extubated at 10am tomorrow morning. [facility] transport has been set up for 11am tomorrow morning. CM has notified Essence RN.

## 2019-11-20 NOTE — Other (Signed)
11/20/2019    I met with patient's daughter again who has spoken with her brothers and they have all come to a decision.    They would like to withdraw support in the morning and then arrange for transfer to home with [facility] right after that    She is aware that patient may well expire immediately after extubation or prior to arriving home, and she is okay with that    I spoke with case management and we will make the necessary arrangements for discharge in the a.m. following withdrawal of vent support        Ludwig Clarks, MD

## 2019-11-20 NOTE — Progress Notes (Signed)
Follow up visit in ICU 261 re patient status and plan of care. Chaplain informed patient's children are not in consensus re plan of care. Chaplain advised of availability of chaplains for follow up upon further referrals.     Visited by Chaplain Dan Spreacker, BCC, MACM   Chaplain can be contacted by calling operator and requesting chaplain on call

## 2019-11-20 NOTE — Progress Notes (Signed)
Chart reviewed. Patient remains on ventilator support. At this time, no one has been able to produce POA paperwork and patient's children have been unable to come to an agreement on plan of care at this time. Per Dr. Frelier, patient's daughter is attempting to get a copy of POA paperwork. CM will continue to follow.

## 2019-11-20 NOTE — Progress Notes (Signed)
Hospitalist Progress Note               Daily Progress Note: 11/20/2019      Subjective:   The patient is seen for follow up.   Condition is unchanged.   Patient remains unresponsive    Problem List:  Problem List as of 11/20/2019 Never Reviewed          Codes Class Noted - Resolved    AMS (altered mental status) ICD-10-CM: R41.82  ICD-9-CM: 780.97  11/10/2019 - Present              Medications reviewed  Current Facility-Administered Medications   Medication Dose Route Frequency   ??? NOREPINephrine (LEVOPHED) 8 mg in 5% dextrose 21mL (32 mcg/mL) infusion  0.5-30 mcg/min IntraVENous TITRATE   ??? glycopyrrolate (ROBINUL) injection 0.1 mg  0.1 mg IntraVENous TID PRN   ??? insulin glargine (LANTUS) injection 5 Units  5 Units SubCUTAneous QHS   ??? meropenem (MERREM) 1 g in sterile water (preservative free) 20 mL IV syringe  1 g IntraVENous Q12H   ??? metoprolol (LOPRESSOR) injection 5 mg  5 mg IntraVENous Q6H PRN   ??? propofol (DIPRIVAN) 10 mg/mL infusion  0-50 mcg/kg/min IntraVENous TITRATE   ??? insulin lispro (HUMALOG) injection   SubCUTAneous Q6H   ??? glucose chewable tablet 16 g  4 Tab Oral PRN   ??? glucagon (GLUCAGEN) injection 1 mg  1 mg IntraMUSCular PRN   ??? dextrose (D50W) injection syrg 12.5-25 g  25-50 mL IntraVENous PRN   ??? chlorhexidine (PERIDEX) 0.12 % mouthwash 15 mL  15 mL Oral Q12H   ??? sodium chloride (NS) flush 5-40 mL  5-40 mL IntraVENous Q8H   ??? sodium chloride (NS) flush 5-40 mL  5-40 mL IntraVENous PRN   ??? acetaminophen (TYLENOL) tablet 650 mg  650 mg Oral Q6H PRN    Or   ??? acetaminophen (TYLENOL) suppository 650 mg  650 mg Rectal Q6H PRN   ??? ondansetron (ZOFRAN ODT) tablet 4 mg  4 mg Oral Q8H PRN    Or   ??? ondansetron (ZOFRAN) injection 4 mg  4 mg IntraVENous Q6H PRN       Review of Systems:   Review of systems not obtained due to patient factors.    Objective:   Physical Exam:     Visit Vitals  BP 129/64 (BP 1 Location: Right arm, BP Patient Position: At rest)   Pulse (!) 105   Temp 98.6 ??F (37 ??C)    Resp 12   Ht 5' 0.98" (1.549 m)   Wt 69.3 kg (152 lb 12.5 oz)   SpO2 100%   BMI 28.88 kg/m??      O2 Device: Ventilator    Temp (24hrs), Avg:98.6 ??F (37 ??C), Min:98.1 ??F (36.7 ??C), Max:98.8 ??F (37.1 ??C)    No intake/output data recorded.   01/09 1901 - 01/11 0700  In: 1615   Out: 1940 [Urine:1940]    General:   Intubated, unresponsive   Lungs:   Clear to auscultation bilaterally.   Chest wall:  No tenderness or deformity.   Heart:  Regular rate and rhythm, S1, S2 normal, no murmur, click, rub or gallop.   Abdomen:   Soft, non-tender. Bowel sounds normal. No masses,  No organomegaly.   Extremities: Extremities normal, atraumatic, no cyanosis or edema.   Pulses: 2+ and symmetric all extremities.   Skin: Skin color, texture, turgor normal. No rashes or lesions   Neurologic: CNII-XII intact.  Normal oculocephalic  reflex.  No response to painful stimuli.  No withdrawal to pain         Data Review:       Recent Days:  No results for input(s): WBC, HGB, HCT, PLT, HGBEXT, HCTEXT, PLTEXT, HGBEXT, HCTEXT, PLTEXT in the last 72 hours.  No results for input(s): NA, K, CL, CO2, GLU, BUN, CREA, CA, MG, PHOS, ALB, TBIL, TBILI, ALT, INR, INREXT, INREXT in the last 72 hours.    No lab exists for component: SGOT  Recent Labs     11/20/19  0400 11/19/19  0315 11/18/19  0445   PH 7.51* 7.50* 7.50*   PCO2 37 36 37   PO2 81 78 81   HCO3 30* 29* 29*   FIO2 24.0 24 24.0       24 Hour Results:  Recent Results (from the past 24 hour(s))   GLUCOSE, POC    Collection Time: 11/19/19 11:03 AM   Result Value Ref Range    Glucose (POC) 209 (H) 65 - 100 mg/dL    Performed by Sherene Sires    GLUCOSE, POC    Collection Time: 11/19/19  5:03 PM   Result Value Ref Range    Glucose (POC) 260 (H) 65 - 100 mg/dL    Performed by Sherene Sires    GLUCOSE, POC    Collection Time: 11/19/19  6:05 PM   Result Value Ref Range    Glucose (POC) 287 (H) 65 - 100 mg/dL    Performed by Grier Rocher    GLUCOSE, POC    Collection Time: 11/19/19  8:49 PM    Result Value Ref Range    Glucose (POC) 257 (H) 65 - 100 mg/dL    Performed by Brayton Caves    BLOOD GAS, ARTERIAL    Collection Time: 11/20/19  4:00 AM   Result Value Ref Range    pH 7.51 (H) 7.35 - 7.45      PCO2 37 35 - 45 mmHg    PO2 81 75 - 100 mmHg    O2 SAT 98 >95 %    BICARBONATE 30 (H) 22 - 26 mmol/L    BASE EXCESS 6.4 (H) 0 - 2 mmol/L    O2 METHOD VENT      FIO2 24.0 %    MODE Assist Control/Volume Control      Tidal volume 450      SET RATE 12      IPAP/PIP 0      EPAP/CPAP/PEEP 5.0      SITE Right Radial      ALLEN'S TEST PASS     GLUCOSE, POC    Collection Time: 11/20/19  5:44 AM   Result Value Ref Range    Glucose (POC) 256 (H) 65 - 100 mg/dL    Performed by DOTSON KEFAYA        XR CHEST PORT   Final Result   Impression: No significant interval change.         XR CHEST PORT   Final Result   IMPRESSION: ET tube tip in 3-4 cm above the carina. Gastric tube coiled within   the proximal stomach. Gaseous distention of stomach. Mild enlargement of   cardiopericardial silhouette, slightly accentuated by rightward patient   obliquity. Subtle left basilar airspace opacities, improved from previous,   consistent with resolving atelectasis and/or pneumonia. No pneumothorax.      XR CHEST PORT   Final Result   IMPRESSION: ET tube tip above the carina.  A nasogastric tube is coiled within   the stomach. Subtle left basilar airspace disease, atelectasis versus   pneumonitis. Right lung relatively clear. No pneumothorax. Mild enlargement of   cardiopericardial silhouette.      MRI BRAIN W WO CONT   Final Result   IMPRESSION: Restricted diffusion in the posterior temporal frontal parietal and   occipital regions consistent with hypoxic ischemic encephalopathy. Sparing of   the basal ganglia, thalamus and posterior fossa. Differential diagnoses includes   PRES (posterior reversible encephalopathy syndrome). Repeat MRI in a week's time   is recommended.      Prominent microvascular ischemic changes as described above.       CT HEAD WO CONT   Final Result   IMPRESSION: Similar findings compared to CT head 11/12/2019.                  XR CHEST PORT   Final Result   Impression: Underexpanded lungs with bibasilar atelectasis.  No significant   interval change.         CTA HEAD   Final Result   IMPRESSION:   1.  No evidence of acute intracranial process on noncontrast CT head.   2.  No large vessel occlusion, hemodynamically significant luminal stenosis or   intracranial aneurysm.      XR CHEST PORT   Final Result   Impression:    No acute cardiopulmonary process.      NM LUNG SCAN PERF   Final Result   Impression:   Normal perfusion scintigraphy.         XR CHEST SNGL V   Final Result   IMPRESSION:   1.  The endotracheal tube tip overlies the tracheal airway approximately 37 mm   above the carina.   2.  The lungs are clear without acute pulmonary parenchymal pathology.        XR CHEST SNGL V   Final Result   IMPRESSION:   1.  The lung parenchyma is clear without acute pulmonary parenchymal pathology.   2.  There is milder ectasia of the thoracic aorta related to mild   atherosclerotic vascular changes.        CT HEAD WO CONT   Final Result   IMPRESSION:    1.  This examination is negative for acute intracranial pathology.   2.  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   3.  There is apparent proptosis more pronounced on the right compared to left.                CT SPINE CERV WO CONT   Final Result   Impression:    1.  This examination is negative for an acute fracture or a subluxation injury   to the cervical spine.   2.  There is moderate degenerative disc disease at the C5-C6 level.   3.  There is right-sided facet arthropathy at the C2-C3 and the C3-C4 levels.   There is narrowing of the right neural foramen at C3-C4 level. There is   left-sided facet arthropathy primarily at the C2-C3 C4-C5 levels. There is   narrowing of the left C4-C5 neural foramen.             XR CHEST PORT    (Results Pending)   XR CHEST PORT    (Results Pending)        Assessment:  Severe encephalopathy, anoxic encephalopathy.  Suspect  she had a cardiac arrest event at home with ROSC    Acute respiratory failure with hypoxia, requiring mechanical ventilation    Hypotension    Aspiration pneumonia    History of lung cancer, details unknown    Diabetes mellitus type 2    History of stroke      Plan:  Continue mechanical ventilation and supportive care  Continue IV antibiotics    I will try to contact her PCP from [facility] this morning to get a copy of POA paperwork.  If we are able to get this, her daughter wants to proceed with withdrawal of support      Care Plan discussed with: Nurse    Total time spent with patient: 30 minutes.    Lieutenant Diego, MD

## 2019-11-20 NOTE — Progress Notes (Signed)
Pulmonology and Critical Care Progress Note    Subjective:     Patient seen and examined in ICU this morning   no significant overnight events.    Intubated on mechanical ventilation  Currently on assist control rate of 12, tidal volume 450, FiO2 24%, PEEP of 5  ABG this morning 7.5 1/37/81, therefore I have reduced her tidal volume to 400 and FiO2 to 21%.    MRI of brain done showed anoxic brain changes.  Neurology following.  Had CT scan of head done with contrast yesterday which did not show any malignant spread to the brain   Patient is off of sedation.  She is alert but is not following any commands, she is breathing over the ventilator.    Family trying to make a decision on comfort care.  Apparently the patient's daughter does want to withdraw care, but her sons want to continue to be aggressive (per chart review).  Attempting to locate POA paperwork.      Review of Systems:  Review of systems not obtained due to patient factors.    Current Facility-Administered Medications   Medication Dose Route Frequency Provider Last Rate Last Admin   ??? glycopyrrolate (ROBINUL) injection 0.1 mg  0.1 mg IntraVENous TID PRN Lieutenant Diego, MD   0.1 mg at 11/16/19 2130   ??? insulin glargine (LANTUS) injection 5 Units  5 Units SubCUTAneous QHS Lieutenant Diego, MD   5 Units at 11/19/19 2137   ??? meropenem (MERREM) 1 g in sterile water (preservative free) 20 mL IV syringe  1 g IntraVENous Q12H Lieutenant Diego, MD   1 g at 11/20/19 0309   ??? metoprolol (LOPRESSOR) injection 5 mg  5 mg IntraVENous Q6H PRN Lieutenant Diego, MD       ??? propofol (DIPRIVAN) 10 mg/mL infusion  0-50 mcg/kg/min IntraVENous TITRATE Lieutenant Diego, MD 4.1 mL/hr at 11/17/19 1020 10 mcg/kg/min at 11/17/19 1020   ??? insulin lispro (HUMALOG) injection   SubCUTAneous Q6H Lieutenant Diego, MD   9 Units at 11/20/19 0547   ??? glucose chewable tablet 16 g  4 Tab Oral PRN Lieutenant Diego, MD        ??? glucagon (GLUCAGEN) injection 1 mg  1 mg IntraMUSCular PRN Lieutenant Diego, MD       ??? dextrose (D50W) injection syrg 12.5-25 g  25-50 mL IntraVENous PRN Roselyn Reef, Almedia Balls, MD       ??? chlorhexidine (PERIDEX) 0.12 % mouthwash 15 mL  15 mL Oral Q12H Kardar, Ahmed Marin Olp, MD   15 mL at 11/20/19 0814   ??? sodium chloride (NS) flush 5-40 mL  5-40 mL IntraVENous Q8H Bishop, Krysten, PA-C   10 mL at 11/20/19 0546   ??? sodium chloride (NS) flush 5-40 mL  5-40 mL IntraVENous PRN Bishop, Krysten, PA-C   10 mL at 11/11/19 0134   ??? acetaminophen (TYLENOL) tablet 650 mg  650 mg Oral Q6H PRN Bishop, Krysten, PA-C        Or   ??? acetaminophen (TYLENOL) suppository 650 mg  650 mg Rectal Q6H PRN Bishop, Krysten, PA-C       ??? ondansetron (ZOFRAN ODT) tablet 4 mg  4 mg Oral Q8H PRN Bishop, Krysten, PA-C        Or   ??? ondansetron (ZOFRAN) injection 4 mg  4 mg IntraVENous Q6H PRN Bishop, Krysten, PA-C   4 mg at 11/10/19 2346  No Known Allergies        Objective:     Blood pressure 104/72, pulse (!) 118, temperature 99 ??F (37.2 ??C), resp. rate 18, height 5' 0.98" (1.549 m), weight 69.3 kg (152 lb 12.5 oz), SpO2 100 %. Temp (24hrs), Avg:98.6 ??F (37 ??C), Min:97.9 ??F (36.6 ??C), Max:99 ??F (37.2 ??C)      Intake and Output:  Current Shift: No intake/output data recorded.  Last 3 Shifts: 01/09 1901 - 01/11 0700  In: 1615   Out: 1940 [Urine:1940]    Physical Exam:    General:  Lying in bed, intubated on mechanical ventilation. Eyes open, not following any commands.   Neck: Supple  Lung:  Patient has good air entry bilaterally.  No wheezing was appreciated.   Heart: S1+S2.  No murmurs, tachycardia  Abdomen: soft, non-tender. Bowel sounds normal. No masses; obese  Extremities: No edema  GU: Not done  Skin: No cyanosis   Neurologic:  Off sedation, on mechanical ventilation.  No response to noxious stimuli.  Lab/Data Review:  Recent Results (from the past 24 hour(s))   GLUCOSE, POC    Collection Time: 11/19/19 11:03 AM    Result Value Ref Range    Glucose (POC) 209 (H) 65 - 100 mg/dL    Performed by [place], POC    Collection Time: 11/19/19  5:03 PM   Result Value Ref Range    Glucose (POC) 260 (H) 65 - 100 mg/dL    Performed by [place], POC    Collection Time: 11/19/19  6:05 PM   Result Value Ref Range    Glucose (POC) 287 (H) 65 - 100 mg/dL    Performed by [place], POC    Collection Time: 11/19/19  8:49 PM   Result Value Ref Range    Glucose (POC) 257 (H) 65 - 100 mg/dL    Performed by Tanna Furry    BLOOD GAS, ARTERIAL    Collection Time: 11/20/19  4:00 AM   Result Value Ref Range    pH 7.51 (H) 7.35 - 7.45      PCO2 37 35 - 45 mmHg    PO2 81 75 - 100 mmHg    O2 SAT 98 >95 %    BICARBONATE 30 (H) 22 - 26 mmol/L    BASE EXCESS 6.4 (H) 0 - 2 mmol/L    O2 METHOD VENT      FIO2 24.0 %    MODE Assist Control/Volume Control      Tidal volume 450      SET RATE 12      IPAP/PIP 0      EPAP/CPAP/PEEP 5.0      SITE Right Radial      ALLEN'S TEST PASS     GLUCOSE, POC    Collection Time: 11/20/19  5:44 AM   Result Value Ref Range    Glucose (POC) 256 (H) 65 - 100 mg/dL    Performed by DOTSON KEFAYA      chest X-ray      XR CHEST PORT   Final Result   Impression: No significant interval change.         XR CHEST PORT   Final Result   IMPRESSION: ET tube tip in 3-4 cm above the carina. Gastric tube coiled within   the proximal stomach. Gaseous distention of stomach. Mild enlargement of   cardiopericardial silhouette, slightly accentuated by rightward  patient   obliquity. Subtle left basilar airspace opacities, improved from previous,   consistent with resolving atelectasis and/or pneumonia. No pneumothorax.      XR CHEST PORT   Final Result   IMPRESSION: ET tube tip above the carina. A nasogastric tube is coiled within   the stomach. Subtle left basilar airspace disease, atelectasis versus   pneumonitis. Right lung relatively clear. No pneumothorax. Mild enlargement of    cardiopericardial silhouette.      MRI BRAIN W WO CONT   Final Result   IMPRESSION: Restricted diffusion in the posterior temporal frontal parietal and   occipital regions consistent with hypoxic ischemic encephalopathy. Sparing of   the basal ganglia, thalamus and posterior fossa. Differential diagnoses includes   PRES (posterior reversible encephalopathy syndrome). Repeat MRI in a week's time   is recommended.      Prominent microvascular ischemic changes as described above.      CT HEAD WO CONT   Final Result   IMPRESSION: Similar findings compared to CT head 11/12/2019.                  XR CHEST PORT   Final Result   Impression: Underexpanded lungs with bibasilar atelectasis.  No significant   interval change.         CTA HEAD   Final Result   IMPRESSION:   1.  No evidence of acute intracranial process on noncontrast CT head.   2.  No large vessel occlusion, hemodynamically significant luminal stenosis or   intracranial aneurysm.      XR CHEST PORT   Final Result   Impression:    No acute cardiopulmonary process.      NM LUNG SCAN PERF   Final Result   Impression:   Normal perfusion scintigraphy.         XR CHEST SNGL V   Final Result   IMPRESSION:   1.  The endotracheal tube tip overlies the tracheal airway approximately 37 mm   above the carina.   2.  The lungs are clear without acute pulmonary parenchymal pathology.        XR CHEST SNGL V   Final Result   IMPRESSION:   1.  The lung parenchyma is clear without acute pulmonary parenchymal pathology.   2.  There is milder ectasia of the thoracic aorta related to mild   atherosclerotic vascular changes.        CT HEAD WO CONT   Final Result   IMPRESSION:    1.  This examination is negative for acute intracranial pathology.   2.  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   3.  There is apparent proptosis more pronounced on the right compared to left.                CT SPINE CERV WO CONT    Final Result   Impression:    1.  This examination is negative for an acute fracture or a subluxation injury   to the cervical spine.   2.  There is moderate degenerative disc disease at the C5-C6 level.   3.  There is right-sided facet arthropathy at the C2-C3 and the C3-C4 levels.   There is narrowing of the right neural foramen at C3-C4 level. There is   left-sided facet arthropathy primarily at the C2-C3 C4-C5 levels. There is   narrowing of the left C4-C5 neural foramen.  XR CHEST PORT    (Results Pending)   XR CHEST PORT    (Results Pending)   XR CHEST PORT    (Results Pending)       CT Results  (Last 48 hours)    None          Assessment:   A 69 year old African-American female with multiple medical problems including history of prior stroke and lung cancer who has been on chemotherapy, last chemotherapy in December 2020 , presented with unresponsiveness.      1.  Acute respiratory failure :  She was intubated en route to the hospital for airway protection.  Currently, she is off propofol.  She is minimally responsive, only opening eyes.  Apparently, she has a history of lung cancer.  Chest x-ray showed underinflation    Currently on AC/VC 400/12/21%/5  ABGs and chest x-ray reviewed, will reorder for tomorrow unless she is made comfort care.  Ventilator settings modified as above.  Would recommend comfort care given anoxic brain injury.  Awaiting decision from family members, there is multiple family members with opposite thoughts on the appropriate care plan.  If aggressive care is desired, the patient should undergo tracheostomy.    2.  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    As discussed above,   Neurology is consulted, appreciate their recommendations. Neurology noted that patient was posturing on right side.  CT scan of head with contrast was done which was unremarkable  MRI brain was done which showed ischemic changes in the brain    3.  History of lung cancer.     Patient has received last chemotherapy in December 2020, details are not available.    Chest x-ray, however, does not show any significant mass or nodule or other chronic changes.    4.  History of stroke.  5.  Diabetes mellitus.        For deep venous thrombosis prophylaxis, the patient is started on heparin subcutaneously, which will be continued.  For gastrointestinal stress prophylaxis, she is started on Protonix IV which will be continued.        Thank you for involving me in the care of this patient  I will follow with you closely during hospitalization    This document has been prepared by the Dragon voice recognition system, typographical errors may have occurred. Attempts have been made to correct errors, however inadvertent errors may persist.    Gabriela Eves, DO  [facility]  11/20/2019

## 2019-11-20 NOTE — Discharge Summary (Signed)
Physician Discharge Summary     Patient ID:    Kristin Cooper  [id number]  69 y.o.  29-Jan-1951    Admit date: 11/10/2019    Discharge date : 11/21/2019  Chronic Diagnoses:    Problem List as of 11/20/2019 Never Reviewed          Codes Class Noted - Resolved    AMS (altered mental status) ICD-10-CM: R41.82  ICD-9-CM: 780.97  11/10/2019 - Present          22    Final Diagnoses:   Severe  anoxic encephalopathy.  Suspect she had a cardiac arrest event at home with ROSC  ??  Acute respiratory failure with hypoxia, requiring mechanical ventilation  ??  Hypotension  ??  Aspiration pneumonia  ??  History of lung cancer, details unknown  ??  Diabetes mellitus type 2  ??  History of stroke    Reason for Hospitalization:  She is a 69 year old female admitted on 1/1 after being found unresponsive by family.  She has a history of stage IV lung cancer, stroke, and diabetes.  She recently moved from [place], [name] to live with her daughter.  Family apparently started CPR at home although unclear if she ever lost a pulse.  Upon arrival of EMS she had stable vital signs and a pulse.  She was intubated by EMS.  CT of the head showed no acute changes.   Patient had vomiting prior to intubation and is currently on Merrem for possible aspiration.      Hospital Course:   Patient was admitted to the ICU on full ventilator support.    She was started on IV antibiotics for suspected aspiration pneumonia    Patient remained unresponsive throughout her hospital stay with no meaningful neurologic improvement    It is suspected that she suffered a cardiac arrest type event at home with spontaneous recovery of pulse prior to arrival of EMS    EEG was obtained which showed diffuse slowing, consistent with severe encephalopathy    MRI was obtained which was consistent with severe anoxic brain injury     There was initially discordance between what patient's daughter wanted and what her sons wanted.  Her daughter was in favor of withdrawal of support but the sons were in favor of continuing aggressive care.  Her daughter indicated she had POA but was unable to provide any paperwork regarding this    The family was finally able to make a collective decision on 1/11.  They want to withdraw vent support and bring patient home immediately afterward with hospice care    I have placed orders for extubation in the morning followed by transferred home with [facility]        Discharge Medications:   Current Discharge Medication List      START taking these medications    Details   morphine (ROXANOL) 100 mg/5 mL (20 mg/mL) concentrated solution Take 0.5 mL by mouth every two (2) hours as needed for Pain for up to 3 days. Max Daily Amount: 120 mg.  Qty: 30 mL, Refills: 0    Associated Diagnoses: Respiratory distress         STOP taking these medications       atorvastatin (LIPITOR) 40 mg tablet Comments:   Reason for Stopping:         glimepiride (AMARYL) 4 mg tablet Comments:   Reason for Stopping:         lisinopriL (PRINIVIL, ZESTRIL) 40 mg tablet  Comments:   Reason for Stopping:         amLODIPine (Norvasc) 10 mg tablet Comments:   Reason for Stopping:         clonazePAM (KlonoPIN) 0.5 mg tablet Comments:   Reason for Stopping:         traMADoL (ULTRAM) 50 mg tablet Comments:   Reason for Stopping:         insulin glargine (Lantus Solostar U-100 Insulin) 100 unit/mL (3 mL) inpn Comments:   Reason for Stopping:                 Follow up Care:    1. [name], NP in 1-2 weeks.  Please call to set up an appointment shortly after discharge.      Diet:  Comfort feeding    Disposition:  Home.    Advanced Directive:   FULL    DNR x     Discharge Exam:  General:   Intubated and unresponsive   Lungs:   Clear to auscultation bilaterally.   Chest wall:  No tenderness or deformity.    Heart:  Regular rate and rhythm, S1, S2 normal, no murmur, click, rub or gallop.   Abdomen:   Soft, non-tender. Bowel sounds normal. No masses,  No organomegaly.   Extremities: Extremities normal, atraumatic, no cyanosis or edema.   Pulses: 2+ and symmetric all extremities.   Skin: Skin color, texture, turgor normal. No rashes or lesions             CONSULTATIONS: Pulmonary/Intensive care, neurology    Significant Diagnostic Studies:   11/10/2019: BUN 37 mg/dL* (Ref range: 6 - 20 mg/dL); Calcium 10.2 mg/dL* (Ref range: 8.5 - 10.1 mg/dL); CO2 22 mmol/L (Ref range: 21 - 32 mmol/L); Creatinine 2.01 mg/dL* (Ref range: 0.55 - 1.02 mg/dL); Glucose 146 mg/dL* (Ref range: 65 - 100 mg/dL); HCT 41.8 % (Ref range: 35.0 - 47.0 %); HGB 13.6 g/dL (Ref range: 11.5 - 16.0 g/dL); Potassium 4.1 mmol/L (Ref range: 3.5 - 5.1 mmol/L); Sodium 147 mmol/L* (Ref range: 136 - 145 mmol/L)  11/11/2019: BUN 25 mg/dL* (Ref range: 6 - 20 mg/dL); Calcium 9.1 mg/dL (Ref range: 8.5 - 10.1 mg/dL); CO2 22 mmol/L (Ref range: 21 - 32 mmol/L); Creatinine 1.29 mg/dL* (Ref range: 0.55 - 1.02 mg/dL); Glucose 121 mg/dL* (Ref range: 65 - 100 mg/dL); HCT 34.6 %* (Ref range: 35.0 - 47.0 %); HGB 11.0 g/dL* (Ref range: 11.5 - 16.0 g/dL); Potassium 3.6 mmol/L (Ref range: 3.5 - 5.1 mmol/L); Sodium 149 mmol/L* (Ref range: 136 - 145 mmol/L)  No results for input(s): WBC, HGB, HCT, PLT, HGBEXT, HCTEXT, PLTEXT in the last 72 hours.  No results for input(s): NA, K, CL, CO2, BUN, CREA, GLU, CA, MG, PHOS, URICA in the last 72 hours.  No results for input(s): ALT, AP, TBIL, TBILI, TP, ALB, GLOB, GGT, AML, LPSE in the last 72 hours.    No lab exists for component: SGOT, GPT, AMYP, HLPSE  No results for input(s): INR, PTP, APTT, INREXT in the last 72 hours.   No results for input(s): FE, TIBC, PSAT, FERR in the last 72 hours.   Recent Labs     11/20/19  0400 11/19/19  0315   PH 7.51* 7.50*   PCO2 37 36   PO2 81 78     No results for input(s): CPK, CKMB in the last 72 hours.     No lab exists for component: TROPONINI  Lab Results   Component Value Date/Time  Glucose (POC) 256 (H) 11/20/2019 11:44 AM    Glucose (POC) 256 (H) 11/20/2019 05:44 AM    Glucose (POC) 257 (H) 11/19/2019 08:49 PM    Glucose (POC) 287 (H) 11/19/2019 06:05 PM    Glucose (POC) 260 (H) 11/19/2019 05:03 PM       Discharge time spent 35 minutes    Signed:  Lieutenant Diego, MD  11/20/2019  2:41 PM

## 2019-11-20 NOTE — Progress Notes (Addendum)
NEUROLOGY  PROGRESS NOTE    Admission History/Pertinent Events  Kristin Cooper is a 69 y.o. year old female who presented on 11/10/2019. Patient has a past medical history of Lung Cancer, HTN, DM, HL who initially presented after being found unresponsive by family.  Per chart review, patient had several episodes of unresponsiveness on the week prior to presentation.  Per daughter's report.  Patient reportedly has behavioral arrest and then has staring episodes with decreased responsiveness.  On day of presentation, patient had a similar episode but then had vomiting thereafter.  Patient became more responsive than usual and family members reportedly started CPR.  EMS came to find patient had a good pulse but intubated patient and gave her naloxone due to concerns for airway protection.  Patient's glucose is initially was found to be 53. Emergent CT head was negative for any acute findings.  Chest x-ray concerning for possible aspiration pneumonia for which patient was started on IV antibiotics.    Home Stroke Prophylaxis: Atorvastatin 40      ASSESSMENT/PLAN      Impression  Patient's neurological examination not significantly changed.  Patient continues to have brainstem reflexes and some responses to deep pain stimulation in the peripheries but does not follow any central peripheral commands.    Prognosis remains poor.      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    CTH WO (11/12/18 d/t new RUE extensor posturing)  No interval changes from prior study    EEG  Moderate to severe generalized encephalopathy without evidence of cortical irritability    MRI Brain WWO  Restricted diffusion in the bilateral frontal, parietal, temporal and occipital cortical regions most concerning for hypoxic ischemic injury      Plan      Encephalopathy  Associated: Hypoxic Brain Injury, Lung Cancer, Hypoglycemia, PNA  -Q4NeuroChecks, TELE  -Seizure Precautions   -No AEDs at the current time  -STAT IV lorazepam 2 mg with any clinical seizure activity lasting greater than 3 minutes and contact Neurology for further recommendations  -Management of infectious/metabolic derangements to referring teams        SUBJECTIVE   Patient not on any sedatives or drips.  Patient is also on meropenem.  No significant changes on neurological examination.  Patient with some low blood pressures with systolics in the 80s overnight.      Physical/Neurological Exam  [name],  Cardiovascular: normal rate and rhythm  Pulmonary: CTAB    Patient intubated  Patient does not follow any central or peripheral commands  Does not attempt to speak  Disconjugate gaze  Pupils react to light bilaterally  Oculocephalics intact  Corneal reflex intact bilaterally  No BTT bilaterally  No facial droop  Cough and gag are intact  Tongue is midline under ETT  Motor   LUE: 3/5   RUE: extensor posturing   BLE: 1/5 to pain  No abnormal movements  Normal tone throughout      OBJECTIVE  Vital Signs  Temp:  [97.7 ??F (36.5 ??C)-99.1 ??F (37.3 ??C)]   Pulse (Heart Rate):  [78-115]   BP: (87-176)/(56-90)   Resp Rate:  [11-22]   O2 Sat (%):  [97 %-100 %]   Weight:  [69 kg (152 lb 1.9 oz)-70.7 kg (155 lb 13.8 oz)]     MEDICATIONS    Current Facility-Administered Medications:   ???  NOREPINephrine (LEVOPHED) 8 mg in 5% dextrose (32  mcg/mL) infusion, 0.5-30 mcg/min, IntraVENous, TITRATE, Frelier, Doran Heater, MD, Last Rate: 7.5 mL/hr at 11/17/19 0850, 4 mcg/min at 11/17/19 0850  ???  glycopyrrolate (ROBINUL) injection 0.1 mg, 0.1 mg, IntraVENous, TID PRN, Ludwig Clarks, MD, 0.1 mg at 11/16/19 2130  ???  insulin glargine (LANTUS) injection 5 Units, 5 Units, SubCUTAneous, QHS, Frelier, Doran Heater, MD, 5 Units at 11/18/19 2201  ???  meropenem (MERREM) 1 g in sterile water (preservative free) 20 mL IV syringe, 1 g, IntraVENous, Q12H, Frelier, Doran Heater, MD, 1 g at 11/19/19 1402   ???  metoprolol (LOPRESSOR) injection 5 mg, 5 mg, IntraVENous, Q6H PRN, Frelier, Doran Heater, MD  ???  propofol (DIPRIVAN) 10 mg/mL infusion, 0-50 mcg/kg/min, IntraVENous, TITRATE, Celine Ahr, Doran Heater, MD, Last Rate: 4.1 mL/hr at 11/17/19 1020, 10 mcg/kg/min at 11/17/19 1020  ???  insulin lispro (HUMALOG) injection, , SubCUTAneous, Q6H, Frelier, Doran Heater, MD, 9 Units at 11/19/19 1808  ???  glucose chewable tablet 16 g, 4 Tab, Oral, PRN, Celine Ahr, Doran Heater, MD  ???  glucagon (GLUCAGEN) injection 1 mg, 1 mg, IntraMUSCular, PRN, Celine Ahr, Doran Heater, MD  ???  dextrose (D50W) injection syrg 12.5-25 g, 25-50 mL, IntraVENous, PRN, Celine Ahr, Doran Heater, MD  ???  chlorhexidine (PERIDEX) 0.12 % mouthwash 15 mL, 15 mL, Oral, Q12H, Kardar, Ahmed Mertie Moores, MD, 15 mL at 11/19/19 6269  ???  sodium chloride (NS) flush 5-40 mL, 5-40 mL, IntraVENous, Q8H, Bishop, Krysten, PA-C, 10 mL at 11/19/19 1355  ???  sodium chloride (NS) flush 5-40 mL, 5-40 mL, IntraVENous, PRN, Bishop, Krysten, PA-C, 10 mL at 11/11/19 0134  ???  acetaminophen (TYLENOL) tablet 650 mg, 650 mg, Oral, Q6H PRN **OR** acetaminophen (TYLENOL) suppository 650 mg, 650 mg, Rectal, Q6H PRN, Bishop, Krysten, PA-C  ???  ondansetron (ZOFRAN ODT) tablet 4 mg, 4 mg, Oral, Q8H PRN **OR** ondansetron (ZOFRAN) injection 4 mg, 4 mg, IntraVENous, Q6H PRN, Bishop, Krysten, PA-C, 4 mg at 11/10/19 2346      Labs: I've reviewed the labs for today     This document has been prepared by the [employer] voice recognition system, typographical errors may have occurred. Attempts have been made to correct errors, however inadvertent errors may persist.

## 2019-11-20 NOTE — Progress Notes (Signed)
NEUROLOGY  PROGRESS NOTE    Admission History/Pertinent Events  Kristin Cooper is a 69 y.o. year old female who presented on 11/10/2019. Patient has a past medical history of Lung Cancer, HTN, DM, HL who initially presented after being found unresponsive by family.  Per chart review, patient had several episodes of unresponsiveness on the week prior to presentation.  Per daughter's report.  Patient reportedly has behavioral arrest and then has staring episodes with decreased responsiveness.  On day of presentation, patient had a similar episode but then had vomiting thereafter.  Patient became more responsive than usual and family members reportedly started CPR.  EMS came to find patient had a good pulse but intubated patient and gave her naloxone due to concerns for airway protection.  Patient's glucose is initially was found to be 53. Emergent CT head was negative for any acute findings.  Chest x-ray concerning for possible aspiration pneumonia for which patient was started on IV antibiotics.    Home Stroke Prophylaxis: Atorvastatin 40      ASSESSMENT/PLAN      Impression  Patient's neurological examination not significantly changed.  Patient continues to have brainstem reflexes and some responses to deep pain stimulation in the peripheries but does not follow any central peripheral commands.    Prognosis remains poor.      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    CTH WO (11/12/18 d/t new RUE extensor posturing)  No interval changes from prior study    EEG  Moderate to severe generalized encephalopathy without evidence of cortical irritability    MRI Brain WWO  Restricted diffusion in the bilateral frontal, parietal, temporal and occipital cortical regions most concerning for hypoxic ischemic injury      Plan      Encephalopathy  Associated: Hypoxic Brain Injury, Lung Cancer, Hypoglycemia, PNA  -Q4NeuroChecks, TELE  -Seizure Precautions  -No AEDs at the  current time  -STAT IV lorazepam 2 mg with any clinical seizure activity lasting greater than 3 minutes and contact Neurology for further recommendations  -Management of infectious/metabolic derangements to referring teams        SUBJECTIVE   Patient not on any sedatives or drips.  Patient is also on meropenem.  No significant changes on neurological examination.  Patient with some low blood pressures with systolics in the 16X overnight.      Physical/Neurological Exam  [name],  Cardiovascular: normal rate and rhythm  Pulmonary: CTAB    Patient intubated  Patient does not follow any central or peripheral commands  Does not attempt to speak  Disconjugate gaze  Pupils react to light bilaterally  Oculocephalics intact  Corneal reflex intact bilaterally  No BTT bilaterally  No facial droop  Cough and gag are intact  Tongue is midline under ETT  Motor   LUE: 3/5   RUE: extensor posturing   BLE: 1/5 to pain  No abnormal movements  Normal tone throughout      OBJECTIVE  Vital Signs  Temp:  [97.7 ??F (36.5 ??C)-99.1 ??F (37.3 ??C)]   Pulse (Heart Rate):  [78-115]   BP: (87-176)/(56-90)   Resp Rate:  [11-22]   O2 Sat (%):  [97 %-100 %]   Weight:  [69 kg (152 lb 1.9 oz)-70.7 kg (155 lb 13.8 oz)]     MEDICATIONS    Current Facility-Administered Medications:   ???  NOREPINephrine (LEVOPHED) 8 mg in 5% dextrose 225mL (32  mcg/mL) infusion, 0.5-30 mcg/min, IntraVENous, TITRATE, Frelier, Almedia Balls, MD, Last Rate: 7.5 mL/hr at 11/17/19 0850, 4 mcg/min at 11/17/19 0850  ???  glycopyrrolate (ROBINUL) injection 0.1 mg, 0.1 mg, IntraVENous, TID PRN, Lieutenant Diego, MD, 0.1 mg at 11/16/19 2130  ???  insulin glargine (LANTUS) injection 5 Units, 5 Units, SubCUTAneous, QHS, Frelier, Almedia Balls, MD, 5 Units at 11/18/19 2201  ???  meropenem (MERREM) 1 g in sterile water (preservative free) 20 mL IV syringe, 1 g, IntraVENous, Q12H, Frelier, Almedia Balls, MD, 1 g at 11/19/19 1402  ???  metoprolol (LOPRESSOR) injection 5 mg, 5 mg,  IntraVENous, Q6H PRN, Frelier, Almedia Balls, MD  ???  propofol (DIPRIVAN) 10 mg/mL infusion, 0-50 mcg/kg/min, IntraVENous, TITRATE, Roselyn Reef, Almedia Balls, MD, Last Rate: 4.1 mL/hr at 11/17/19 1020, 10 mcg/kg/min at 11/17/19 1020  ???  insulin lispro (HUMALOG) injection, , SubCUTAneous, Q6H, Frelier, Almedia Balls, MD, 9 Units at 11/19/19 1808  ???  glucose chewable tablet 16 g, 4 Tab, Oral, PRN, Roselyn Reef, Almedia Balls, MD  ???  glucagon (GLUCAGEN) injection 1 mg, 1 mg, IntraMUSCular, PRN, Roselyn Reef, Almedia Balls, MD  ???  dextrose (D50W) injection syrg 12.5-25 g, 25-50 mL, IntraVENous, PRN, Roselyn Reef, Almedia Balls, MD  ???  chlorhexidine (PERIDEX) 0.12 % mouthwash 15 mL, 15 mL, Oral, Q12H, Kardar, Ahmed Marin Olp, MD, 15 mL at 11/19/19 5027  ???  sodium chloride (NS) flush 5-40 mL, 5-40 mL, IntraVENous, Q8H, Bishop, Krysten, PA-C, 10 mL at 11/19/19 1355  ???  sodium chloride (NS) flush 5-40 mL, 5-40 mL, IntraVENous, PRN, Bishop, Krysten, PA-C, 10 mL at 11/11/19 0134  ???  acetaminophen (TYLENOL) tablet 650 mg, 650 mg, Oral, Q6H PRN **OR** acetaminophen (TYLENOL) suppository 650 mg, 650 mg, Rectal, Q6H PRN, Bishop, Krysten, PA-C  ???  ondansetron (ZOFRAN ODT) tablet 4 mg, 4 mg, Oral, Q8H PRN **OR** ondansetron (ZOFRAN) injection 4 mg, 4 mg, IntraVENous, Q6H PRN, Bishop, Krysten, PA-C, 4 mg at 11/10/19 2346      Labs: I've reviewed the labs for today     This document has been prepared by the [employer] voice recognition system, typographical errors may have occurred. Attempts have been made to correct errors, however inadvertent errors may persist.

## 2019-11-20 NOTE — Discharge Summary (Signed)
Physician Discharge Summary     Patient ID:    Kristin Cooper  [id number]  68 y.o.  12/14/1950    Admit date: 11/10/2019    Discharge date : 11/21/2019  Chronic Diagnoses:    Problem List as of 11/20/2019 Never Reviewed          Codes Class Noted - Resolved    AMS (altered mental status) ICD-10-CM: R41.82  ICD-9-CM: 780.97  11/10/2019 - Present          22    Final Diagnoses:   Severe  anoxic encephalopathy.  Suspect she had a cardiac arrest event at home with ROSC  ??  Acute respiratory failure with hypoxia, requiring mechanical ventilation  ??  Hypotension  ??  Aspiration pneumonia  ??  History of lung cancer, details unknown  ??  Diabetes mellitus type 2  ??  History of stroke    Reason for Hospitalization:  She is a 69 year old female admitted on 1/1 after being found unresponsive by family.  She has a history of stage IV lung cancer, stroke, and diabetes.  [place] her daughter.  Family apparently started CPR at home although unclear if she ever lost a pulse.  Upon arrival of EMS she had stable vital signs and a pulse.  She was intubated by EMS.  CT of the head showed no acute changes.   Patient had vomiting prior to intubation and is currently on Merrem for possible aspiration.      Hospital Course:   Patient was admitted to the ICU on full ventilator support.    She was started on IV antibiotics for suspected aspiration pneumonia    Patient remained unresponsive throughout her hospital stay with no meaningful neurologic improvement    It is suspected that she suffered a cardiac arrest type event at home with spontaneous recovery of pulse prior to arrival of EMS    EEG was obtained which showed diffuse slowing, consistent with severe encephalopathy    MRI was obtained which was consistent with severe anoxic brain injury    There was initially discordance between what patient's daughter wanted and what her sons wanted.  Her daughter was in favor of  withdrawal of support but the sons were in favor of continuing aggressive care.  Her daughter indicated she had POA but was unable to provide any paperwork regarding this    The family was finally able to make a collective decision on 1/11.  They want to withdraw vent support and bring patient home immediately afterward with hospice care    I have placed orders for extubation in the morning followed by transferred home with [facility]        Discharge Medications:   Current Discharge Medication List      START taking these medications    Details   morphine (ROXANOL) 100 mg/5 mL (20 mg/mL) concentrated solution Take 0.5 mL by mouth every two (2) hours as needed for Pain for up to 3 days. Max Daily Amount: 120 mg.  Qty: 30 mL, Refills: 0    Associated Diagnoses: Respiratory distress         STOP taking these medications       atorvastatin (LIPITOR) 40 mg tablet Comments:   Reason for Stopping:         glimepiride (AMARYL) 4 mg tablet Comments:   Reason for Stopping:         lisinopriL (PRINIVIL, ZESTRIL) 40 mg tablet  Comments:   Reason for Stopping:         amLODIPine (Norvasc) 10 mg tablet Comments:   Reason for Stopping:         clonazePAM (KlonoPIN) 0.5 mg tablet Comments:   Reason for Stopping:         traMADoL (ULTRAM) 50 mg tablet Comments:   Reason for Stopping:         insulin glargine (Lantus Solostar U-100 Insulin) 100 unit/mL (3 mL) inpn Comments:   Reason for Stopping:                 Follow up Care:    1. Shirline Frees, NP in 1-2 weeks.  Please call to set up an appointment shortly after discharge.      Diet:  Comfort feeding    Disposition:  Home.    Advanced Directive:   FULL    DNR x     Discharge Exam:  General:   Intubated and unresponsive   Lungs:   Clear to auscultation bilaterally.   Chest wall:  No tenderness or deformity.   Heart:  Regular rate and rhythm, S1, S2 normal, no murmur, click, rub or gallop.   Abdomen:   Soft, non-tender. Bowel sounds normal. No masses,  No organomegaly.    Extremities: Extremities normal, atraumatic, no cyanosis or edema.   Pulses: 2+ and symmetric all extremities.   Skin: Skin color, texture, turgor normal. No rashes or lesions             CONSULTATIONS: Pulmonary/Intensive care, neurology    Significant Diagnostic Studies:   11/10/2019: BUN 37 mg/dL* (Ref range: 6 - 20 mg/dL); Calcium 10.2 mg/dL* (Ref range: 8.5 - 27.0 mg/dL); CO2 22 mmol/L (Ref range: 21 - 32 mmol/L); Creatinine 2.01 mg/dL* (Ref range: 6.23 - 7.62 mg/dL); Glucose 146 mg/dL* (Ref range: 65 - 831 mg/dL); HCT 51.7 % (Ref range: 35.0 - 47.0 %); HGB 13.6 g/dL (Ref range: 61.6 - 07.3 g/dL); Potassium 4.1 mmol/L (Ref range: 3.5 - 5.1 mmol/L); Sodium 147 mmol/L* (Ref range: 136 - 145 mmol/L)  11/11/2019: BUN 25 mg/dL* (Ref range: 6 - 20 mg/dL); Calcium 9.1 mg/dL (Ref range: 8.5 - 71.0 mg/dL); CO2 22 mmol/L (Ref range: 21 - 32 mmol/L); Creatinine 1.29 mg/dL* (Ref range: 6.26 - 9.48 mg/dL); Glucose 121 mg/dL* (Ref range: 65 - 546 mg/dL); HCT 27.0 %* (Ref range: 35.0 - 47.0 %); HGB 11.0 g/dL* (Ref range: 35.0 - 09.3 g/dL); Potassium 3.6 mmol/L (Ref range: 3.5 - 5.1 mmol/L); Sodium 149 mmol/L* (Ref range: 136 - 145 mmol/L)  No results for input(s): WBC, HGB, HCT, PLT, HGBEXT, HCTEXT, PLTEXT in the last 72 hours.  No results for input(s): NA, K, CL, CO2, BUN, CREA, GLU, CA, MG, PHOS, URICA in the last 72 hours.  No results for input(s): ALT, AP, TBIL, TBILI, TP, ALB, GLOB, GGT, AML, LPSE in the last 72 hours.    No lab exists for component: SGOT, GPT, AMYP, HLPSE  No results for input(s): INR, PTP, APTT, INREXT in the last 72 hours.   No results for input(s): FE, TIBC, PSAT, FERR in the last 72 hours.   Recent Labs     11/20/19  0400 11/19/19  0315   PH 7.51* 7.50*   PCO2 37 36   PO2 81 78     No results for input(s): CPK, CKMB in the last 72 hours.    No lab exists for component: TROPONINI  Lab Results   Component Value Date/Time  Glucose (POC) 256 (H) 11/20/2019 11:44 AM    Glucose (POC) 256 (H) 11/20/2019  05:44 AM    Glucose (POC) 257 (H) 11/19/2019 08:49 PM    Glucose (POC) 287 (H) 11/19/2019 06:05 PM    Glucose (POC) 260 (H) 11/19/2019 05:03 PM       Discharge time spent 35 minutes    Signed:  Ludwig Clarks, MD  11/20/2019  2:41 PM

## 2019-11-20 NOTE — Progress Notes (Signed)
11/20/2019    I met with patient's daughter again who has spoken with her brothers and they have all come to a decision.    They would like to withdraw support in the morning and then arrange for transfer to home with [facility] right after that    She is aware that patient may well expire immediately after extubation or prior to arriving home, and she is okay with that    I spoke with case management and we will make the necessary arrangements for discharge in the a.m. following withdrawal of vent support        Clarisa Danser R Nemesis Rainwater, MD

## 2019-11-20 NOTE — Progress Notes (Signed)
Progress Notes by Ludwig Clarks, MD at 11/20/19 [phone]                Author: Ludwig Clarks, MD  Service: Internal Medicine  Author Type: Physician       Filed: 11/20/19 0754  Date of Service: 11/20/19 0753  Status: Signed          Editor: Ludwig Clarks, MD (Physician)                              Hospitalist Progress Note                   Daily Progress Note: 11/20/2019           Subjective:     The patient is seen for follow up.   Condition is unchanged.   Patient remains unresponsive      Problem List:      Problem List  as of 11/20/2019  Never Reviewed                        Codes  Class  Noted - Resolved             AMS (altered mental status)  ICD-10-CM: R41.82   ICD-9-CM: 780.97    11/10/2019 - Present                          Medications reviewed     Current Facility-Administered Medications          Medication  Dose  Route  Frequency           ?  NOREPINephrine (LEVOPHED) 8 mg in 5% dextrose 270mL (32 mcg/mL) infusion   0.5-30 mcg/min  IntraVENous  TITRATE     ?  glycopyrrolate (ROBINUL) injection 0.1 mg   0.1 mg  IntraVENous  TID PRN     ?  insulin glargine (LANTUS) injection 5 Units   5 Units  SubCUTAneous  QHS     ?  meropenem (MERREM) 1 g in sterile water (preservative free) 20 mL IV syringe   1 g  IntraVENous  Q12H     ?  metoprolol (LOPRESSOR) injection 5 mg   5 mg  IntraVENous  Q6H PRN     ?  propofol (DIPRIVAN) 10 mg/mL infusion   0-50 mcg/kg/min  IntraVENous  TITRATE     ?  insulin lispro (HUMALOG) injection     SubCUTAneous  Q6H     ?  glucose chewable tablet 16 g   4 Tab  Oral  PRN     ?  glucagon (GLUCAGEN) injection 1 mg   1 mg  IntraMUSCular  PRN     ?  dextrose (D50W) injection syrg 12.5-25 g   25-50 mL  IntraVENous  PRN     ?  chlorhexidine (PERIDEX) 0.12 % mouthwash 15 mL   15 mL  Oral  Q12H     ?  sodium chloride (NS) flush 5-40 mL   5-40 mL  IntraVENous  Q8H     ?  sodium chloride (NS) flush 5-40 mL   5-40 mL  IntraVENous  PRN     ?  acetaminophen (TYLENOL) tablet 650 mg    650 mg  Oral  Q6H PRN          Or           ?  acetaminophen (TYLENOL) suppository 650 mg   650 mg  Rectal  Q6H PRN     ?  ondansetron (ZOFRAN ODT) tablet 4 mg   4 mg  Oral  Q8H PRN          Or           ?  ondansetron (ZOFRAN) injection 4 mg   4 mg  IntraVENous  Q6H PRN             Review of Systems:     Review of systems not obtained due to patient factors.        Objective:     Physical Exam:       Visit Vitals      BP  129/64 (BP 1 Location: Right arm, BP Patient Position: At rest)     Pulse  (!) 105     Temp  98.6 ??F (37 ??C)     Resp  12     Ht  5' 0.98" (1.549 m)     Wt  69.3 kg (152 lb 12.5 oz)     SpO2  100%        BMI  28.88 kg/m??        O2 Device:  Ventilator      Temp (24hrs), Avg:98.6 ??F (37 ??C), Min:98.1 ??F (36.7 ??C), Max:98.8 ??F (37.1 ??C)     No intake/output data recorded.    01/09 1901 - 01/11 0700   In: 1615    Out: 1940 [Urine:1940]         General:    Intubated, unresponsive        Lungs:    Clear to auscultation bilaterally.        Chest wall:   No tenderness or deformity.        Heart:   Regular rate and rhythm, S1, S2 normal, no murmur, click, rub or gallop.        Abdomen:    Soft, non-tender. Bowel sounds normal. No masses,  No organomegaly.     Extremities:  Extremities normal, atraumatic, no cyanosis or edema.     Pulses:  2+ and symmetric all extremities.     Skin:  Skin color, texture, turgor normal. No rashes or lesions        Neurologic:  CNII-XII intact.  Normal oculocephalic reflex.  No response to painful stimuli.  No withdrawal to pain                Data Review:          Recent Days:   No results for input(s): WBC, HGB, HCT, PLT, HGBEXT, HCTEXT, PLTEXT, HGBEXT, HCTEXT, PLTEXT in the last 72 hours.   No results for input(s): NA, K, CL, CO2, GLU, BUN, CREA, CA, MG, PHOS, ALB, TBIL, TBILI, ALT, INR, INREXT, INREXT in the last 72 hours.      No lab exists for component: SGOT     Recent Labs             11/20/19   0400  11/19/19   0315  11/18/19   0445     PH  7.51*  7.50*  7.50*      PCO2  37  36  37     PO2  81  78  81     HCO3  30*  29*  29*          FIO2  24.0  24  24.0  24 Hour Results:     Recent Results (from the past 24 hour(s))     GLUCOSE, POC          Collection Time: 11/19/19 11:03 AM         Result  Value  Ref Range            Glucose (POC)  209 (H)  65 - 100 mg/dL       Performed by  Sherene SiresLARKE STEPHANIE         GLUCOSE, POC          Collection Time: 11/19/19  5:03 PM         Result  Value  Ref Range            Glucose (POC)  260 (H)  65 - 100 mg/dL       Performed by  Sherene SiresLARKE STEPHANIE         GLUCOSE, POC          Collection Time: 11/19/19  6:05 PM         Result  Value  Ref Range            Glucose (POC)  287 (H)  65 - 100 mg/dL       Performed by  Grier RocherJohnson Essence         GLUCOSE, POC          Collection Time: 11/19/19  8:49 PM         Result  Value  Ref Range            Glucose (POC)  257 (H)  65 - 100 mg/dL       Performed by  Brayton CavesTSON KEFAYA         BLOOD GAS, ARTERIAL          Collection Time: 11/20/19  4:00 AM         Result  Value  Ref Range            pH  7.51 (H)  7.35 - 7.45         PCO2  37  35 - 45 mmHg       PO2  81  75 - 100 mmHg       O2 SAT  98  >95 %       BICARBONATE  30 (H)  22 - 26 mmol/L       BASE EXCESS  6.4 (H)  0 - 2 mmol/L       O2 METHOD  VENT          FIO2  24.0  %       MODE  Assist Control/Volume Control          Tidal volume  450          SET RATE  12          IPAP/PIP  0          EPAP/CPAP/PEEP  5.0          SITE  Right Radial          ALLEN'S TEST  PASS          GLUCOSE, POC          Collection Time: 11/20/19  5:44 AM         Result  Value  Ref Range            Glucose (POC)  256 (H)  65 - 100 mg/dL  Performed by  DOTSON KEFAYA               XR CHEST PORT       Final Result     Impression: No significant interval change.                 XR CHEST PORT       Final Result     IMPRESSION: ET tube tip in 3-4 cm above the carina. Gastric tube coiled within     the proximal stomach. Gaseous distention of stomach. Mild enlargement of      cardiopericardial silhouette, slightly accentuated by rightward patient     obliquity. Subtle left basilar airspace opacities, improved from previous,     consistent with resolving atelectasis and/or pneumonia. No pneumothorax.            XR CHEST PORT       Final Result     IMPRESSION: ET tube tip above the carina. A nasogastric tube is coiled within     the stomach. Subtle left basilar airspace disease, atelectasis versus     pneumonitis. Right lung relatively clear. No pneumothorax. Mild enlargement of     cardiopericardial silhouette.            MRI BRAIN W WO CONT       Final Result     IMPRESSION: Restricted diffusion in the posterior temporal frontal parietal and     occipital regions consistent with hypoxic ischemic encephalopathy. Sparing of     the basal ganglia, thalamus and posterior fossa. Differential diagnoses includes     PRES (posterior reversible encephalopathy syndrome). Repeat MRI in a week's time     is recommended.          Prominent microvascular ischemic changes as described above.            CT HEAD WO CONT       Final Result     IMPRESSION: Similar findings compared to CT head 11/12/2019.                                XR CHEST PORT       Final Result     Impression: Underexpanded lungs with bibasilar atelectasis.  No significant     interval change.                 CTA HEAD       Final Result     IMPRESSION:     1.  No evidence of acute intracranial process on noncontrast CT head.     2.  No large vessel occlusion, hemodynamically significant luminal stenosis or     intracranial aneurysm.            XR CHEST PORT       Final Result     Impression:      No acute cardiopulmonary process.            NM LUNG SCAN PERF       Final Result     Impression:     Normal perfusion scintigraphy.                 XR CHEST SNGL V       Final Result     IMPRESSION:     1.  The endotracheal tube tip overlies the tracheal airway approximately 37 mm     above the  carina.     2.  The lungs are clear without  acute pulmonary parenchymal pathology.              XR CHEST SNGL V       Final Result     IMPRESSION:     1.  The lung parenchyma is clear without acute pulmonary parenchymal pathology.     2.  There is milder ectasia of the thoracic aorta related to mild     atherosclerotic vascular changes.              CT HEAD WO CONT       Final Result     IMPRESSION:      1.  This examination is negative for acute intracranial pathology.     2.  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     3.  There is apparent proptosis more pronounced on the right compared to left.                            CT SPINE CERV WO CONT       Final Result     Impression:      1.  This examination is negative for an acute fracture or a subluxation injury     to the cervical spine.     2.  There is moderate degenerative disc disease at the C5-C6 level.     3.  There is right-sided facet arthropathy at the C2-C3 and the C3-C4 levels.     There is narrowing of the right neural foramen at C3-C4 level. There is     left-sided facet arthropathy primarily at the C2-C3 C4-C5 levels. There is     narrowing of the left C4-C5 neural foramen.                      XR CHEST PORT    (Results Pending)       XR CHEST PORT    (Results Pending)            Assessment:   Severe encephalopathy, anoxic encephalopathy.  Suspect she had a cardiac arrest event at home with ROSC      Acute respiratory failure with hypoxia, requiring mechanical ventilation      Hypotension      Aspiration pneumonia      History of lung cancer, details unknown      Diabetes mellitus type 2      History of stroke         Plan:   Continue mechanical ventilation and supportive care   Continue IV antibiotics      I will try to contact her PCP from [facility] this morning to get a copy of POA paperwork.  If we are able to get this, her daughter wants to proceed with withdrawal of support         Care Plan discussed with:  Nurse      Total time spent with patient: 30 minutes.      Lieutenant Diego, MD

## 2019-11-20 NOTE — Progress Notes (Signed)
Incoming call from Dr. Roselyn Reef stating the family has decided to bring the patient home on hospice. Family has already been in contact with [facility]. CM will send referral to [facility] and make arrangements for patient to discharge in the morning on hospice at home. Once extubated, patient will need to be on <10L NC.    ADDENDUM 426pm  Per primary nurse, Essence RN, patient will be extubated at 10am tomorrow morning. [facility] transport has been set up for 11am tomorrow morning. CM has notified Product/process development scientist.

## 2019-11-20 NOTE — Progress Notes (Signed)
Pulmonology and Critical Care Progress Note    Subjective:     Patient seen and examined in ICU this morning   no significant overnight events.    Intubated on mechanical ventilation  Currently on assist control rate of 12, tidal volume 450, FiO2 24%, PEEP of 5  ABG this morning 7.5 1/37/81, therefore I have reduced her tidal volume to 400 and FiO2 to 21%.    MRI of brain done showed anoxic brain changes.  Neurology following.  Had CT scan of head done with contrast yesterday which did not show any malignant spread to the brain   Patient is off of sedation.  She is alert but is not following any commands, she is breathing over the ventilator.    Family trying to make a decision on comfort care.  Apparently the patient's daughter does want to withdraw care, but her sons want to continue to be aggressive (per chart review).  Attempting to locate POA paperwork.      Review of Systems:  Review of systems not obtained due to patient factors.    Current Facility-Administered Medications   Medication Dose Route Frequency Provider Last Rate Last Admin   ??? glycopyrrolate (ROBINUL) injection 0.1 mg  0.1 mg IntraVENous TID PRN Lieutenant Diego, MD   0.1 mg at 11/16/19 2130   ??? insulin glargine (LANTUS) injection 5 Units  5 Units SubCUTAneous QHS Lieutenant Diego, MD   5 Units at 11/19/19 2137   ??? meropenem (MERREM) 1 g in sterile water (preservative free) 20 mL IV syringe  1 g IntraVENous Q12H Lieutenant Diego, MD   1 g at 11/20/19 0309   ??? metoprolol (LOPRESSOR) injection 5 mg  5 mg IntraVENous Q6H PRN Lieutenant Diego, MD       ??? propofol (DIPRIVAN) 10 mg/mL infusion  0-50 mcg/kg/min IntraVENous TITRATE Lieutenant Diego, MD 4.1 mL/hr at 11/17/19 1020 10 mcg/kg/min at 11/17/19 1020   ??? insulin lispro (HUMALOG) injection   SubCUTAneous Q6H Lieutenant Diego, MD   9 Units at 11/20/19 0547   ??? glucose chewable tablet 16 g  4 Tab Oral PRN Lieutenant Diego, MD       ??? glucagon (GLUCAGEN) injection 1 mg  1 mg IntraMUSCular  PRN Lieutenant Diego, MD       ??? dextrose (D50W) injection syrg 12.5-25 g  25-50 mL IntraVENous PRN Roselyn Reef, Almedia Balls, MD       ??? chlorhexidine (PERIDEX) 0.12 % mouthwash 15 mL  15 mL Oral Q12H Kardar, Ahmed Marin Olp, MD   15 mL at 11/20/19 0814   ??? sodium chloride (NS) flush 5-40 mL  5-40 mL IntraVENous Q8H Bishop, Krysten, PA-C   10 mL at 11/20/19 0546   ??? sodium chloride (NS) flush 5-40 mL  5-40 mL IntraVENous PRN Bishop, Krysten, PA-C   10 mL at 11/11/19 0134   ??? acetaminophen (TYLENOL) tablet 650 mg  650 mg Oral Q6H PRN Bishop, Krysten, PA-C        Or   ??? acetaminophen (TYLENOL) suppository 650 mg  650 mg Rectal Q6H PRN Bishop, Krysten, PA-C       ??? ondansetron (ZOFRAN ODT) tablet 4 mg  4 mg Oral Q8H PRN Bishop, Krysten, PA-C        Or   ??? ondansetron (ZOFRAN) injection 4 mg  4 mg IntraVENous Q6H PRN Bishop, Krysten, PA-C   4 mg at 11/10/19 2346  No Known Allergies        Objective:     Blood pressure 104/72, pulse (!) 118, temperature 99 ??F (37.2 ??C), resp. rate 18, height 5' 0.98" (1.549 m), weight 69.3 kg (152 lb 12.5 oz), SpO2 100 %. Temp (24hrs), Avg:98.6 ??F (37 ??C), Min:97.9 ??F (36.6 ??C), Max:99 ??F (37.2 ??C)      Intake and Output:  Current Shift: No intake/output data recorded.  Last 3 Shifts: 01/09 1901 - 01/11 0700  In: 1615   Out: 1940 [Urine:1940]    Physical Exam:    General:  Lying in bed, intubated on mechanical ventilation. Eyes open, not following any commands.   Neck: Supple  Lung:  Patient has good air entry bilaterally.  No wheezing was appreciated.   Heart: S1+S2.  No murmurs, tachycardia  Abdomen: soft, non-tender. Bowel sounds normal. No masses; obese  Extremities: No edema  GU: Not done  Skin: No cyanosis   Neurologic:  Off sedation, on mechanical ventilation.  No response to noxious stimuli.  Lab/Data Review:  Recent Results (from the past 24 hour(s))   GLUCOSE, POC    Collection Time: 11/19/19 11:03 AM   Result Value Ref Range    Glucose (POC) 209 (H) 65 - 100 mg/dL     Performed by Sherene Sires    GLUCOSE, POC    Collection Time: 11/19/19  5:03 PM   Result Value Ref Range    Glucose (POC) 260 (H) 65 - 100 mg/dL    Performed by Sherene Sires    GLUCOSE, POC    Collection Time: 11/19/19  6:05 PM   Result Value Ref Range    Glucose (POC) 287 (H) 65 - 100 mg/dL    Performed by Grier Rocher    GLUCOSE, POC    Collection Time: 11/19/19  8:49 PM   Result Value Ref Range    Glucose (POC) 257 (H) 65 - 100 mg/dL    Performed by Brayton Caves    BLOOD GAS, ARTERIAL    Collection Time: 11/20/19  4:00 AM   Result Value Ref Range    pH 7.51 (H) 7.35 - 7.45      PCO2 37 35 - 45 mmHg    PO2 81 75 - 100 mmHg    O2 SAT 98 >95 %    BICARBONATE 30 (H) 22 - 26 mmol/L    BASE EXCESS 6.4 (H) 0 - 2 mmol/L    O2 METHOD VENT      FIO2 24.0 %    MODE Assist Control/Volume Control      Tidal volume 450      SET RATE 12      IPAP/PIP 0      EPAP/CPAP/PEEP 5.0      SITE Right Radial      ALLEN'S TEST PASS     GLUCOSE, POC    Collection Time: 11/20/19  5:44 AM   Result Value Ref Range    Glucose (POC) 256 (H) 65 - 100 mg/dL    Performed by DOTSON KEFAYA      chest X-ray      XR CHEST PORT   Final Result   Impression: No significant interval change.         XR CHEST PORT   Final Result   IMPRESSION: ET tube tip in 3-4 cm above the carina. Gastric tube coiled within   the proximal stomach. Gaseous distention of stomach. Mild enlargement of   cardiopericardial silhouette, slightly accentuated by rightward  patient   obliquity. Subtle left basilar airspace opacities, improved from previous,   consistent with resolving atelectasis and/or pneumonia. No pneumothorax.      XR CHEST PORT   Final Result   IMPRESSION: ET tube tip above the carina. A nasogastric tube is coiled within   the stomach. Subtle left basilar airspace disease, atelectasis versus   pneumonitis. Right lung relatively clear. No pneumothorax. Mild enlargement of   cardiopericardial silhouette.      MRI BRAIN W WO CONT   Final Result    IMPRESSION: Restricted diffusion in the posterior temporal frontal parietal and   occipital regions consistent with hypoxic ischemic encephalopathy. Sparing of   the basal ganglia, thalamus and posterior fossa. Differential diagnoses includes   PRES (posterior reversible encephalopathy syndrome). Repeat MRI in a week's time   is recommended.      Prominent microvascular ischemic changes as described above.      CT HEAD WO CONT   Final Result   IMPRESSION: Similar findings compared to CT head 11/12/2019.                  XR CHEST PORT   Final Result   Impression: Underexpanded lungs with bibasilar atelectasis.  No significant   interval change.         CTA HEAD   Final Result   IMPRESSION:   1.  No evidence of acute intracranial process on noncontrast CT head.   2.  No large vessel occlusion, hemodynamically significant luminal stenosis or   intracranial aneurysm.      XR CHEST PORT   Final Result   Impression:    No acute cardiopulmonary process.      NM LUNG SCAN PERF   Final Result   Impression:   Normal perfusion scintigraphy.         XR CHEST SNGL V   Final Result   IMPRESSION:   1.  The endotracheal tube tip overlies the tracheal airway approximately 37 mm   above the carina.   2.  The lungs are clear without acute pulmonary parenchymal pathology.        XR CHEST SNGL V   Final Result   IMPRESSION:   1.  The lung parenchyma is clear without acute pulmonary parenchymal pathology.   2.  There is milder ectasia of the thoracic aorta related to mild   atherosclerotic vascular changes.        CT HEAD WO CONT   Final Result   IMPRESSION:    1.  This examination is negative for acute intracranial pathology.   2.  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   3.  There is apparent proptosis more pronounced on the right compared to left.                CT SPINE CERV WO CONT   Final Result   Impression:    1.  This examination is negative for an acute  fracture or a subluxation injury   to the cervical spine.   2.  There is moderate degenerative disc disease at the C5-C6 level.   3.  There is right-sided facet arthropathy at the C2-C3 and the C3-C4 levels.   There is narrowing of the right neural foramen at C3-C4 level. There is   left-sided facet arthropathy primarily at the C2-C3 C4-C5 levels. There is   narrowing of the left C4-C5 neural foramen.  XR CHEST PORT    (Results Pending)   XR CHEST PORT    (Results Pending)   XR CHEST PORT    (Results Pending)       CT Results  (Last 48 hours)    None          Assessment:   A 69 year old African-American female with multiple medical problems including history of prior stroke and lung cancer who has been on chemotherapy, last chemotherapy in December 2020 , presented with unresponsiveness.      1.  Acute respiratory failure :  She was intubated en route to the hospital for airway protection.  Currently, she is off propofol.  She is minimally responsive, only opening eyes.  Apparently, she has a history of lung cancer.  Chest x-ray showed underinflation    Currently on AC/VC 400/12/21%/5  ABGs and chest x-ray reviewed, will reorder for tomorrow unless she is made comfort care.  Ventilator settings modified as above.  Would recommend comfort care given anoxic brain injury.  Awaiting decision from family members, there is multiple family members with opposite thoughts on the appropriate care plan.  If aggressive care is desired, the patient should undergo tracheostomy.    2.  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    As discussed above,   Neurology is consulted, appreciate their recommendations. Neurology noted that patient was posturing on right side.  CT scan of head with contrast was done which was unremarkable  MRI brain was done which showed ischemic changes in the brain    3.  History of lung cancer.    Patient has received last chemotherapy in December 2020, details are not available.    Chest  x-ray, however, does not show any significant mass or nodule or other chronic changes.    4.  History of stroke.  5.  Diabetes mellitus.        For deep venous thrombosis prophylaxis, the patient is started on heparin subcutaneously, which will be continued.  For gastrointestinal stress prophylaxis, she is started on Protonix IV which will be continued.        Thank you for involving me in the care of this patient  I will follow with you closely during hospitalization    This document has been prepared by the Dragon voice recognition system, typographical errors may have occurred. Attempts have been made to correct errors, however inadvertent errors may persist.    Leonarda Salon, DO  [facility]  11/20/2019

## 2019-11-20 NOTE — Progress Notes (Signed)
Follow up visit in ICU 261 re patient status and plan of care. Chaplain informed patient's children are not in consensus re plan of care. Chaplain advised of availability of chaplains for follow up upon further referrals.     Visited by Marvis Moeller Spreacker, BCC, MACM   Chaplain can be contacted by calling operator and requesting chaplain on call

## 2019-11-20 NOTE — Progress Notes (Signed)
Chart reviewed. Patient remains on ventilator support. At this time, no one has been able to produce POA paperwork and patient's children have been unable to come to an agreement on plan of care at this time. Per Dr. Roselyn Reef, patient's daughter is attempting to get a copy of POA paperwork. CM will continue to follow.

## 2019-11-21 ENCOUNTER — Inpatient Hospital Stay: Admit: 2019-11-21 | Payer: MEDICARE | Primary: Adult Health

## 2019-11-21 LAB — GLUCOSE, POC
Glucose (POC): 226 mg/dL — ABNORMAL HIGH (ref 65–100)
Glucose (POC): 189 mg/dL — ABNORMAL HIGH (ref 65–100)
Glucose (POC): 177 mg/dL — ABNORMAL HIGH (ref 65–100)
Glucose (POC): 113 mg/dL — ABNORMAL HIGH (ref 65–100)

## 2019-11-21 LAB — POCT GLUCOSE
POC Glucose: 226 mg/dL — ABNORMAL HIGH (ref 65–100)
POC Glucose: 189 mg/dL — ABNORMAL HIGH (ref 65–100)
POC Glucose: 177 mg/dL — ABNORMAL HIGH (ref 65–100)
POC Glucose: 113 mg/dL — ABNORMAL HIGH (ref 65–100)

## 2019-11-21 MED FILL — CHLORHEXIDINE GLUCONATE 0.12 % MOUTHWASH: 0.12 % | Qty: 15

## 2019-11-21 MED FILL — HUMALOG U-100 INSULIN 100 UNIT/ML SUBCUTANEOUS SOLUTION: 100 unit/mL | SUBCUTANEOUS | Qty: 3

## 2019-11-21 MED FILL — HUMALOG U-100 INSULIN 100 UNIT/ML SUBCUTANEOUS SOLUTION: 100 unit/mL | SUBCUTANEOUS | Qty: 9

## 2019-11-21 MED FILL — LANTUS U-100 INSULIN 100 UNIT/ML SUBCUTANEOUS SOLUTION: 100 unit/mL | SUBCUTANEOUS | Qty: 5

## 2019-11-21 NOTE — Progress Notes (Signed)
Per request from Essence, this RN called [facility] to update them on the discharge status.     Spoke to Tray at [facility] at 0910. Per Tray, OK to withdraw care.  Asks that we call back with the time of death if it occurs prior to discharge.

## 2019-11-21 NOTE — Progress Notes (Signed)
Transport:    [facility] called to confirm discharge for patient,  Which is scheduled today for 11am.    [facility] stated they are behind in schedules, won't be able to pick up the patient until around 1pm.    This RN Informed Primary RN Essence who called the daughter.    This RN called [facility] with the update.

## 2019-11-21 NOTE — Progress Notes (Signed)
The purpose of the visit was in response to the patient being extubated and going home. The patient's daughter expressed appreciation and gratitude for the care the staff has provided to her mother.    Chaplain James R. Johnson D.Min, M.Div.  Chaplain can be reached by calling the operator at [facility]  [phone]

## 2019-11-21 NOTE — Progress Notes (Signed)
Transport is here to take patient home. Patient is being prepared for transport and extubation.

## 2019-11-21 NOTE — Discharge Summary (Signed)
Physician Discharge Summary     Patient ID:    Kristin Cooper  [id number]  68 y.o.  05-22-1951    Admit date: 11/10/2019    Discharge date : 11/21/2019  Chronic Diagnoses:    Problem List as of 11/21/2019 Never Reviewed          Codes Class Noted - Resolved    AMS (altered mental status) ICD-10-CM: R41.82  ICD-9-CM: 780.97  11/10/2019 - Present          22    Final Diagnoses:   Severe  anoxic encephalopathy.  Suspect she had a cardiac arrest event at home with ROSC  ??  Acute respiratory failure with hypoxia, requiring mechanical ventilation  ??  Hypotension  ??  Aspiration pneumonia  ??  History of lung cancer, details unknown  ??  Diabetes mellitus type 2  ??  History of stroke    Reason for Hospitalization:  She is a 69 year old female admitted on 1/1 after being found unresponsive by family.  She has a history of stage IV lung cancer, stroke, and diabetes.  She recently moved from [place], [place] to live with her daughter.  Family apparently started CPR at home although unclear if she ever lost a pulse.  Upon arrival of EMS she had stable vital signs and a pulse.  She was intubated by EMS.  CT of the head showed no acute changes.   Patient had vomiting prior to intubation and is currently on Merrem for possible aspiration.      Hospital Course:   Patient was admitted to the ICU on full ventilator support.    She was started on IV antibiotics for suspected aspiration pneumonia    Patient remained unresponsive throughout her hospital stay with no meaningful neurologic improvement    It is suspected that she suffered a cardiac arrest type event at home with spontaneous recovery of pulse prior to arrival of EMS    EEG was obtained which showed diffuse slowing, consistent with severe encephalopathy    MRI was obtained which was consistent with severe anoxic brain injury     There was initially discordance between what patient's daughter wanted and what her sons wanted.  Her daughter was in favor of withdrawal of support but the sons were in favor of continuing aggressive care.  Her daughter indicated she had POA but was unable to provide any paperwork regarding this    The family was finally able to make a collective decision on 1/11.  They want to withdraw vent support and bring patient home immediately afterward with hospice care            Discharge Medications:   Current Discharge Medication List      START taking these medications    Details   morphine (ROXANOL) 100 mg/5 mL (20 mg/mL) concentrated solution Take 0.5 mL by mouth every two (2) hours as needed for Pain for up to 3 days. Max Daily Amount: 120 mg.  Qty: 30 mL, Refills: 0    Associated Diagnoses: Respiratory distress         STOP taking these medications       atorvastatin (LIPITOR) 40 mg tablet Comments:   Reason for Stopping:         glimepiride (AMARYL) 4 mg tablet Comments:   Reason for Stopping:         lisinopriL (PRINIVIL, ZESTRIL) 40 mg tablet Comments:   Reason for Stopping:         amLODIPine (  Norvasc) 10 mg tablet Comments:   Reason for Stopping:         clonazePAM (KlonoPIN) 0.5 mg tablet Comments:   Reason for Stopping:         traMADoL (ULTRAM) 50 mg tablet Comments:   Reason for Stopping:         insulin glargine (Lantus Solostar U-100 Insulin) 100 unit/mL (3 mL) inpn Comments:   Reason for Stopping:                 Follow up Care:    1. Shirline Frees, NP in 1-2 weeks.  Please call to set up an appointment shortly after discharge.      Diet:  Comfort feeding    Disposition:  Home.    Advanced Directive:   FULL    DNR x     Discharge Exam:  General:   Intubated and unresponsive   Lungs:   Clear to auscultation bilaterally.   Chest wall:  No tenderness or deformity.   Heart:  Regular rate and rhythm, S1, S2 normal, no murmur, click, rub or gallop.    Abdomen:   Soft, non-tender. Bowel sounds normal. No masses,  No organomegaly.   Extremities: Extremities normal, atraumatic, no cyanosis or edema.   Pulses: 2+ and symmetric all extremities.   Skin: Skin color, texture, turgor normal. No rashes or lesions             CONSULTATIONS: Pulmonary/Intensive care, neurology    Significant Diagnostic Studies:   11/10/2019: BUN 37 mg/dL* (Ref range: 6 - 20 mg/dL); Calcium 10.2 mg/dL* (Ref range: 8.5 - 00.3 mg/dL); CO2 22 mmol/L (Ref range: 21 - 32 mmol/L); Creatinine 2.01 mg/dL* (Ref range: 7.04 - 8.88 mg/dL); Glucose 146 mg/dL* (Ref range: 65 - 916 mg/dL); HCT 94.5 % (Ref range: 35.0 - 47.0 %); HGB 13.6 g/dL (Ref range: 03.8 - 88.2 g/dL); Potassium 4.1 mmol/L (Ref range: 3.5 - 5.1 mmol/L); Sodium 147 mmol/L* (Ref range: 136 - 145 mmol/L)  11/11/2019: BUN 25 mg/dL* (Ref range: 6 - 20 mg/dL); Calcium 9.1 mg/dL (Ref range: 8.5 - 80.0 mg/dL); CO2 22 mmol/L (Ref range: 21 - 32 mmol/L); Creatinine 1.29 mg/dL* (Ref range: 3.49 - 1.79 mg/dL); Glucose 121 mg/dL* (Ref range: 65 - 150 mg/dL); HCT 56.9 %* (Ref range: 35.0 - 47.0 %); HGB 11.0 g/dL* (Ref range: 79.4 - 80.1 g/dL); Potassium 3.6 mmol/L (Ref range: 3.5 - 5.1 mmol/L); Sodium 149 mmol/L* (Ref range: 136 - 145 mmol/L)  No results for input(s): WBC, HGB, HCT, PLT, HGBEXT, HCTEXT, PLTEXT, HGBEXT, HCTEXT, PLTEXT in the last 72 hours.  No results for input(s): NA, K, CL, CO2, BUN, CREA, GLU, CA, MG, PHOS, URICA in the last 72 hours.  No results for input(s): ALT, AP, TBIL, TBILI, TP, ALB, GLOB, GGT, AML, LPSE in the last 72 hours.    No lab exists for component: SGOT, GPT, AMYP, HLPSE  No results for input(s): INR, PTP, APTT, INREXT, INREXT in the last 72 hours.   No results for input(s): FE, TIBC, PSAT, FERR in the last 72 hours.   Recent Labs     11/20/19  0400 11/19/19  0315   PH 7.51* 7.50*   PCO2 37 36   PO2 81 78     No results for input(s): CPK, CKMB in the last 72 hours.    No lab exists for component: TROPONINI   Lab Results   Component Value Date/Time    Glucose (POC) 113 (H) 11/21/2019 11:24 AM  Glucose (POC) 177 (H) 11/21/2019 06:31 AM    Glucose (POC) 189 (H) 11/20/2019 11:57 PM    Glucose (POC) 226 (H) 11/20/2019 10:28 PM    Glucose (POC) 270 (H) 11/20/2019 05:15 PM       Discharge time spent 35 minutes    Signed:  Shelda Pal, MD  11/21/2019  2:41 PM

## 2019-11-21 NOTE — Progress Notes (Signed)
Hospitalist Progress Note               Daily Progress Note: 11/21/2019      Subjective:   69 y/o female with PMH of DM, CVA, and Lung cancer was brought in by EMS to [facility] on 11/10/2019 for being unresponsive. Neurology consult concludes severe anoxic encephalopathy. Goal is to d/c to [facility].     Today patient is seen in ICU bed by Medical student. Patient is unresponsive even to painful stimuli of nail bed compression. Patient is set to be discharged today.   Problem List:  Problem List as of 11/21/2019 Never Reviewed          Codes Class Noted - Resolved    AMS (altered mental status) ICD-10-CM: R41.82  ICD-9-CM: 780.97  11/10/2019 - Present              Medications reviewed  Current Facility-Administered Medications   Medication Dose Route Frequency   ??? glycopyrrolate (ROBINUL) injection 0.1 mg  0.1 mg IntraVENous TID PRN   ??? insulin glargine (LANTUS) injection 5 Units  5 Units SubCUTAneous QHS   ??? meropenem (MERREM) 1 g in sterile water (preservative free) 20 mL IV syringe  1 g IntraVENous Q12H   ??? metoprolol (LOPRESSOR) injection 5 mg  5 mg IntraVENous Q6H PRN   ??? insulin lispro (HUMALOG) injection   SubCUTAneous Q6H   ??? glucose chewable tablet 16 g  4 Tab Oral PRN   ??? glucagon (GLUCAGEN) injection 1 mg  1 mg IntraMUSCular PRN   ??? dextrose (D50W) injection syrg 12.5-25 g  25-50 mL IntraVENous PRN   ??? chlorhexidine (PERIDEX) 0.12 % mouthwash 15 mL  15 mL Oral Q12H   ??? sodium chloride (NS) flush 5-40 mL  5-40 mL IntraVENous Q8H   ??? sodium chloride (NS) flush 5-40 mL  5-40 mL IntraVENous PRN   ??? acetaminophen (TYLENOL) tablet 650 mg  650 mg Oral Q6H PRN    Or   ??? acetaminophen (TYLENOL) suppository 650 mg  650 mg Rectal Q6H PRN   ??? ondansetron (ZOFRAN ODT) tablet 4 mg  4 mg Oral Q8H PRN    Or   ??? ondansetron (ZOFRAN) injection 4 mg  4 mg IntraVENous Q6H PRN       Review of Systems:   Review of systems not obtained due to patient factors.    Objective:   Physical Exam:     Visit Vitals  BP (!) 95/57    Pulse (!) 102   Temp 98.8 ??F (37.1 ??C)   Resp 12   Ht 5' 0.98" (1.549 m)   Wt 157 lb 10.1 oz (71.5 kg)   SpO2 95%   BMI 29.80 kg/m??      O2 Device: Ventilator    Temp (24hrs), Avg:98.4 ??F (36.9 ??C), Min:97.5 ??F (36.4 ??C), Max:99.1 ??F (37.3 ??C)    No intake/output data recorded.   01/10 1901 - 01/12 0700  In: 1105   Out: 1400 [Urine:1400]    General:   Intubated, unresponsive   Lungs:   Clear to auscultation bilaterally.   Chest wall:  No tenderness or deformity.   Heart:  Regular rate and rhythm, S1, S2 normal, no murmur, click, rub or gallop.   Abdomen:   Soft, non-tender. Bowel sounds normal. No masses,  No organomegaly.   Extremities: Extremities normal, atraumatic, no cyanosis or edema.   Pulses: 2+ and symmetric all extremities.   Skin: Skin color, texture, turgor normal. No rashes or  lesions   Neurologic: CNII-XII unable to perform. No response to painful stimuli.  No withdrawal to pain.         Data Review:       Recent Days:  No results for input(s): WBC, HGB, HCT, PLT, HGBEXT, HCTEXT, PLTEXT, HGBEXT, HCTEXT, PLTEXT in the last 72 hours.  No results for input(s): NA, K, CL, CO2, GLU, BUN, CREA, CA, MG, PHOS, ALB, TBIL, TBILI, ALT, INR, INREXT, INREXT in the last 72 hours.    No lab exists for component: SGOT  Recent Labs     11/20/19  0400 11/19/19  0315   PH 7.51* 7.50*   PCO2 37 36   PO2 81 78   HCO3 30* 29*   FIO2 24.0 24       24 Hour Results:  Recent Results (from the past 24 hour(s))   GLUCOSE, POC    Collection Time: 11/20/19  5:15 PM   Result Value Ref Range    Glucose (POC) 270 (H) 65 - 100 mg/dL    Performed by Grier Rocher    GLUCOSE, POC    Collection Time: 11/20/19 10:28 PM   Result Value Ref Range    Glucose (POC) 226 (H) 65 - 100 mg/dL    Performed by Brayton Caves    GLUCOSE, POC    Collection Time: 11/20/19 11:57 PM   Result Value Ref Range    Glucose (POC) 189 (H) 65 - 100 mg/dL    Performed by Jiles Crocker    GLUCOSE, POC    Collection Time: 11/21/19  6:31 AM   Result Value Ref Range     Glucose (POC) 177 (H) 65 - 100 mg/dL    Performed by CESAIRE ISAAC    GLUCOSE, POC    Collection Time: 11/21/19 11:24 AM   Result Value Ref Range    Glucose (POC) 113 (H) 65 - 100 mg/dL    Performed by Laural Benes Essence        XR CHEST PORT   Final Result      XR CHEST PORT   Final Result   Impression: No significant interval change.         XR CHEST PORT   Final Result   IMPRESSION: ET tube tip in 3-4 cm above the carina. Gastric tube coiled within   the proximal stomach. Gaseous distention of stomach. Mild enlargement of   cardiopericardial silhouette, slightly accentuated by rightward patient   obliquity. Subtle left basilar airspace opacities, improved from previous,   consistent with resolving atelectasis and/or pneumonia. No pneumothorax.      XR CHEST PORT   Final Result   IMPRESSION: ET tube tip above the carina. A nasogastric tube is coiled within   the stomach. Subtle left basilar airspace disease, atelectasis versus   pneumonitis. Right lung relatively clear. No pneumothorax. Mild enlargement of   cardiopericardial silhouette.      MRI BRAIN W WO CONT   Final Result   IMPRESSION: Restricted diffusion in the posterior temporal frontal parietal and   occipital regions consistent with hypoxic ischemic encephalopathy. Sparing of   the basal ganglia, thalamus and posterior fossa. Differential diagnoses includes   PRES (posterior reversible encephalopathy syndrome). Repeat MRI in a week's time   is recommended.      Prominent microvascular ischemic changes as described above.      CT HEAD WO CONT   Final Result   IMPRESSION: Similar findings compared to CT head 11/12/2019.  XR CHEST PORT   Final Result   Impression: Underexpanded lungs with bibasilar atelectasis.  No significant   interval change.         CTA HEAD   Final Result   IMPRESSION:   1.  No evidence of acute intracranial process on noncontrast CT head.   2.  No large vessel occlusion, hemodynamically significant luminal stenosis or    intracranial aneurysm.      XR CHEST PORT   Final Result   Impression:    No acute cardiopulmonary process.      NM LUNG SCAN PERF   Final Result   Impression:   Normal perfusion scintigraphy.         XR CHEST SNGL V   Final Result   IMPRESSION:   1.  The endotracheal tube tip overlies the tracheal airway approximately 37 mm   above the carina.   2.  The lungs are clear without acute pulmonary parenchymal pathology.        XR CHEST SNGL V   Final Result   IMPRESSION:   1.  The lung parenchyma is clear without acute pulmonary parenchymal pathology.   2.  There is milder ectasia of the thoracic aorta related to mild   atherosclerotic vascular changes.        CT HEAD WO CONT   Final Result   IMPRESSION:    1.  This examination is negative for acute intracranial pathology.   2.  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   3.  There is apparent proptosis more pronounced on the right compared to left.                CT SPINE CERV WO CONT   Final Result   Impression:    1.  This examination is negative for an acute fracture or a subluxation injury   to the cervical spine.   2.  There is moderate degenerative disc disease at the C5-C6 level.   3.  There is right-sided facet arthropathy at the C2-C3 and the C3-C4 levels.   There is narrowing of the right neural foramen at C3-C4 level. There is   left-sided facet arthropathy primarily at the C2-C3 C4-C5 levels. There is   narrowing of the left C4-C5 neural foramen.            XR CHEST PORT    (Results Pending)   XR CHEST PORT    (Results Pending)   XR CHEST PORT    (Results Pending)        Assessment:  Severe encephalopathy, anoxic encephalopathy with possible cardiac arrest event at home with ROSC    Acute respiratory failure with hypoxia, requiring mechanical ventilation    Hypotension    Aspiration pneumonia    History of lung cancer, details unknown    Diabetes mellitus type 2    History of stroke       Plan:  Continue mechanical ventilation and supportive care. Patient scheduled to be extubated and discharged to nursing home facility   Continue IV meropenem  Continue insulin glargine and lispro   Discuss end of life care with POA     Overall prognosis- Poor     Christiana Pellant

## 2019-11-21 NOTE — Progress Notes (Deleted)
*  ATTENTION:  This note has been created by a medical student for educational purposes only.  Please do not refer to the content of this note for clinical decision-making, billing, or other purposes.  Please see attending physician???s note to obtain clinical information on this patient.66       69 y/o female with PMH of DM, CVA, and Lung cancer was brought in by EMS to [facility] on 11/10/2019 for being unresponsive. Neurology consult concludes severe anoxic encephalopathy. Goal is to d/c to [facility].   Today patient is seen in ICU bed by Medical student. Patient is unresponsive even to painful stimuli of nail bed compression. Patient is set to be discharged today.

## 2019-11-21 NOTE — Progress Notes (Signed)
Progressing towards goal

## 2019-11-21 NOTE — Progress Notes (Signed)
1330hrs-patient extubated placed on 4L of oxygen, DDNR signed by Dr. Kometa.  Patient on stretcher enroute home to [place], [place], daughter present.

## 2019-11-21 NOTE — Progress Notes (Signed)
Extubated patient to 4lpm nasal cannula at 13:26 for comfort care. Patient being transported home. Daughter at bedside.

## 2019-11-21 NOTE — Other (Signed)
It appears that the family has elected to compassionately extubate the patient and attempt to bring her home with hospice if she has not passed away.  The pulmonary service will sign off now, please do not hesitate to contact us with any further questions/concerns prior to discharge.    Gabriela Eves, DO  [facility] (PAT)

## 2019-11-21 NOTE — Progress Notes (Signed)
1330hrs-patient extubated placed on 4L of oxygen, DDNR signed by Dr. Tamala Ser.  Patient on stretcher enroute home to [place], [place], daughter present.

## 2019-11-21 NOTE — Progress Notes (Signed)
Transport:    [facility] called to confirm discharge for patient,  Which is scheduled today for 11am.    [facility] stated they are behind in schedules, won't be able to pick up the patient until around 1pm.    This RN Informed Primary RN Essence who called the daughter.    This RN called [facility] with the update.

## 2019-11-21 NOTE — Progress Notes (Signed)
The purpose of the visit was in response to the patient being extubated and going home. The patient's daughter expressed appreciation and gratitude for the care the staff has provided to her mother.    Chaplain Charlett Lango D.Min, M.Div.  Chaplain can be reached by calling the operator at [facility]  [phone]

## 2019-11-21 NOTE — Progress Notes (Signed)
Transport is here to take patient home. Patient is being prepared for transport and extubation.

## 2019-11-21 NOTE — Progress Notes (Signed)
Progress  Notes by Christiana Pellant at 11/21/19 1145                Author: Christiana Pellant  Service: MEDICINE  Author Type: Medical Student       Filed: 11/21/19 1149  Date of Service: 11/21/19 1145  Status: Signed           Editor: Christiana Pellant (Medical Student)  Cosigner: Shelda Pal, MD at 11/21/19 1541                              Hospitalist Progress Note                   Daily Progress Note: 11/21/2019           Subjective:     69 y/o female with PMH of DM, CVA, and Lung cancer was brought in by EMS to [facility] on 11/10/2019 for being unresponsive. Neurology consult concludes severe anoxic  encephalopathy. Goal is to d/c to [facility].       Today patient is seen in ICU bed by Medical student. Patient is unresponsive even to painful stimuli of nail bed compression. Patient is set to be discharged today.    Problem List:      Problem List  as of 11/21/2019  Never Reviewed                        Codes  Class  Noted - Resolved             AMS (altered mental status)  ICD-10-CM: R41.82   ICD-9-CM: 780.97    11/10/2019 - Present                          Medications reviewed     Current Facility-Administered Medications          Medication  Dose  Route  Frequency           ?  glycopyrrolate (ROBINUL) injection 0.1 mg   0.1 mg  IntraVENous  TID PRN     ?  insulin glargine (LANTUS) injection 5 Units   5 Units  SubCUTAneous  QHS     ?  meropenem (MERREM) 1 g in sterile water (preservative free) 20 mL IV syringe   1 g  IntraVENous  Q12H     ?  metoprolol (LOPRESSOR) injection 5 mg   5 mg  IntraVENous  Q6H PRN           ?  insulin lispro (HUMALOG) injection     SubCUTAneous  Q6H           ?  glucose chewable tablet 16 g   4 Tab  Oral  PRN     ?  glucagon (GLUCAGEN) injection 1 mg   1 mg  IntraMUSCular  PRN     ?  dextrose (D50W) injection syrg 12.5-25 g   25-50 mL  IntraVENous  PRN     ?  chlorhexidine (PERIDEX) 0.12 % mouthwash 15 mL   15 mL  Oral  Q12H     ?  sodium chloride (NS) flush 5-40 mL   5-40  mL  IntraVENous  Q8H     ?  sodium chloride (NS) flush 5-40 mL   5-40 mL  IntraVENous  PRN     ?  acetaminophen (TYLENOL) tablet 650 mg  650 mg  Oral  Q6H PRN          Or           ?  acetaminophen (TYLENOL) suppository 650 mg   650 mg  Rectal  Q6H PRN     ?  ondansetron (ZOFRAN ODT) tablet 4 mg   4 mg  Oral  Q8H PRN          Or           ?  ondansetron (ZOFRAN) injection 4 mg   4 mg  IntraVENous  Q6H PRN             Review of Systems:     Review of systems not obtained due to patient factors.        Objective:     Physical Exam:       Visit Vitals      BP  (!) 95/57     Pulse  (!) 102     Temp  98.8 F (37.1 C)     Resp  12     Ht  5' 0.98" (1.549 m)     Wt  157 lb 10.1 oz (71.5 kg)     SpO2  95%        BMI  29.80 kg/m        O2 Device:  Ventilator      Temp (24hrs), Avg:98.4 F (36.9 C), Min:97.5 F (36.4 C), Max:99.1 F (37.3 C)     No intake/output data recorded.    01/10 1901 - 01/12 0700   In: 1105    Out: 1400 [Urine:1400]         General:    Intubated, unresponsive        Lungs:    Clear to auscultation bilaterally.        Chest wall:   No tenderness or deformity.        Heart:   Regular rate and rhythm, S1, S2 normal, no murmur, click, rub or gallop.        Abdomen:    Soft, non-tender. Bowel sounds normal. No masses,  No organomegaly.     Extremities:  Extremities normal, atraumatic, no cyanosis or edema.     Pulses:  2+ and symmetric all extremities.     Skin:  Skin color, texture, turgor normal. No rashes or lesions        Neurologic:  CNII-XII unable to perform. No response to painful stimuli.  No withdrawal to pain.                Data Review:          Recent Days:   No results for input(s): WBC, HGB, HCT, PLT, HGBEXT, HCTEXT, PLTEXT, HGBEXT, HCTEXT, PLTEXT in the last 72 hours.   No results for input(s): NA, K, CL, CO2, GLU, BUN, CREA, CA, MG, PHOS, ALB, TBIL, TBILI, ALT, INR, INREXT, INREXT in the last 72 hours.      No lab exists for component: SGOT     Recent Labs            11/20/19   0400   11/19/19   0315     PH  7.51*  7.50*     PCO2  37  36     PO2  81  78     HCO3  30*  29*         FIO2  24.0  24  24 Hour Results:     Recent Results (from the past 24 hour(s))     GLUCOSE, POC          Collection Time: 11/20/19  5:15 PM         Result  Value  Ref Range            Glucose (POC)  270 (H)  65 - 100 mg/dL       Performed by  Grier Rocher         GLUCOSE, POC          Collection Time: 11/20/19 10:28 PM         Result  Value  Ref Range            Glucose (POC)  226 (H)  65 - 100 mg/dL       Performed by  Brayton Caves         GLUCOSE, POC          Collection Time: 11/20/19 11:57 PM         Result  Value  Ref Range            Glucose (POC)  189 (H)  65 - 100 mg/dL       Performed by  Jiles Crocker         GLUCOSE, POC          Collection Time: 11/21/19  6:31 AM         Result  Value  Ref Range            Glucose (POC)  177 (H)  65 - 100 mg/dL       Performed by  CESAIRE ISAAC         GLUCOSE, POC          Collection Time: 11/21/19 11:24 AM         Result  Value  Ref Range            Glucose (POC)  113 (H)  65 - 100 mg/dL            Performed by  Laural Benes Essence               XR CHEST PORT       Final Result            XR CHEST PORT       Final Result     Impression: No significant interval change.                 XR CHEST PORT       Final Result     IMPRESSION: ET tube tip in 3-4 cm above the carina. Gastric tube coiled within     the proximal stomach. Gaseous distention of stomach. Mild enlargement of     cardiopericardial silhouette, slightly accentuated by rightward patient     obliquity. Subtle left basilar airspace opacities, improved from previous,     consistent with resolving atelectasis and/or pneumonia. No pneumothorax.            XR CHEST PORT       Final Result     IMPRESSION: ET tube tip above the carina. A nasogastric tube is coiled within     the stomach. Subtle left basilar airspace disease, atelectasis versus     pneumonitis. Right lung relatively clear. No pneumothorax. Mild  enlargement of     cardiopericardial silhouette.  MRI BRAIN W WO CONT       Final Result     IMPRESSION: Restricted diffusion in the posterior temporal frontal parietal and     occipital regions consistent with hypoxic ischemic encephalopathy. Sparing of     the basal ganglia, thalamus and posterior fossa. Differential diagnoses includes     PRES (posterior reversible encephalopathy syndrome). Repeat MRI in a week's time     is recommended.          Prominent microvascular ischemic changes as described above.            CT HEAD WO CONT       Final Result     IMPRESSION: Similar findings compared to CT head 11/12/2019.                                XR CHEST PORT       Final Result     Impression: Underexpanded lungs with bibasilar atelectasis.  No significant     interval change.                 CTA HEAD       Final Result     IMPRESSION:     1.  No evidence of acute intracranial process on noncontrast CT head.     2.  No large vessel occlusion, hemodynamically significant luminal stenosis or     intracranial aneurysm.            XR CHEST PORT       Final Result     Impression:      No acute cardiopulmonary process.            NM LUNG SCAN PERF       Final Result     Impression:     Normal perfusion scintigraphy.                 XR CHEST SNGL V       Final Result     IMPRESSION:     1.  The endotracheal tube tip overlies the tracheal airway approximately 37 mm     above the carina.     2.  The lungs are clear without acute pulmonary parenchymal pathology.              XR CHEST SNGL V       Final Result     IMPRESSION:     1.  The lung parenchyma is clear without acute pulmonary parenchymal pathology.     2.  There is milder ectasia of the thoracic aorta related to mild     atherosclerotic vascular changes.              CT HEAD WO CONT       Final Result     IMPRESSION:      1.  This examination is negative for acute intracranial pathology.     2.  There are milder microvascular changes within the central  white matter and     old basal ganglia lacunar infarcts.     3.  There is apparent proptosis more pronounced on the right compared to left.                            CT SPINE CERV WO CONT       Final Result  Impression:      1.  This examination is negative for an acute fracture or a subluxation injury     to the cervical spine.     2.  There is moderate degenerative disc disease at the C5-C6 level.     3.  There is right-sided facet arthropathy at the C2-C3 and the C3-C4 levels.     There is narrowing of the right neural foramen at C3-C4 level. There is     left-sided facet arthropathy primarily at the C2-C3 C4-C5 levels. There is     narrowing of the left C4-C5 neural foramen.                      XR CHEST PORT    (Results Pending)     XR CHEST PORT    (Results Pending)       XR CHEST PORT    (Results Pending)            Assessment:   Severe encephalopathy, anoxic encephalopathy with possible cardiac arrest event at home with ROSC      Acute respiratory failure with hypoxia, requiring mechanical ventilation      Hypotension      Aspiration pneumonia      History of lung cancer, details unknown      Diabetes mellitus type 2      History of stroke         Plan:   Continue mechanical ventilation and supportive care. Patient scheduled to be extubated and discharged to nursing home facility    Continue IV meropenem   Continue insulin glargine and lispro    Discuss end of life care with POA       Overall prognosis- Poor       Christiana Pellant

## 2019-11-21 NOTE — Progress Notes (Signed)
Per request from Essence, this RN called [facility] to update them on the discharge status.     Spoke to Tray at [employer] at [phone]. Per Tray, OK to withdraw care.  Asks that we call back with the time of death if it occurs prior to discharge.

## 2019-11-21 NOTE — Progress Notes (Signed)
Extubated patient to 4lpm nasal cannula at 13:26 for comfort care. Patient being transported home. Daughter at bedside.

## 2019-11-21 NOTE — Discharge Summary (Signed)
Physician Discharge Summary     Patient ID:    Kristin Cooper  [id number]  68 y.o.  September 04, 1951    Admit date: 11/10/2019    Discharge date : 11/21/2019  Chronic Diagnoses:    Problem List as of 11/21/2019 Never Reviewed          Codes Class Noted - Resolved    AMS (altered mental status) ICD-10-CM: R41.82  ICD-9-CM: 780.97  11/10/2019 - Present          22    Final Diagnoses:   Severe  anoxic encephalopathy.  Suspect she had a cardiac arrest event at home with ROSC  ??  Acute respiratory failure with hypoxia, requiring mechanical ventilation  ??  Hypotension  ??  Aspiration pneumonia  ??  History of lung cancer, details unknown  ??  Diabetes mellitus type 2  ??  History of stroke    Reason for Hospitalization:  She is a 69 year old female admitted on 1/1 after being found unresponsive by family.  She has a history of stage IV lung cancer, stroke, and diabetes.  She recently moved from [place], [place] to live with her daughter.  Family apparently started CPR at home although unclear if she ever lost a pulse.  Upon arrival of EMS she had stable vital signs and a pulse.  She was intubated by EMS.  CT of the head showed no acute changes.   Patient had vomiting prior to intubation and is currently on Merrem for possible aspiration.      Hospital Course:   Patient was admitted to the ICU on full ventilator support.    She was started on IV antibiotics for suspected aspiration pneumonia    Patient remained unresponsive throughout her hospital stay with no meaningful neurologic improvement    It is suspected that she suffered a cardiac arrest type event at home with spontaneous recovery of pulse prior to arrival of EMS    EEG was obtained which showed diffuse slowing, consistent with severe encephalopathy    MRI was obtained which was consistent with severe anoxic brain injury    There was initially discordance between what patient's daughter wanted and what her sons wanted.  Her daughter was in favor of  withdrawal of support but the sons were in favor of continuing aggressive care.  Her daughter indicated she had POA but was unable to provide any paperwork regarding this    The family was finally able to make a collective decision on 1/11.  They want to withdraw vent support and bring patient home immediately afterward with hospice care            Discharge Medications:   Current Discharge Medication List      START taking these medications    Details   morphine (ROXANOL) 100 mg/5 mL (20 mg/mL) concentrated solution Take 0.5 mL by mouth every two (2) hours as needed for Pain for up to 3 days. Max Daily Amount: 120 mg.  Qty: 30 mL, Refills: 0    Associated Diagnoses: Respiratory distress         STOP taking these medications       atorvastatin (LIPITOR) 40 mg tablet Comments:   Reason for Stopping:         glimepiride (AMARYL) 4 mg tablet Comments:   Reason for Stopping:         lisinopriL (PRINIVIL, ZESTRIL) 40 mg tablet Comments:   Reason for Stopping:         amLODIPine (  Norvasc) 10 mg tablet Comments:   Reason for Stopping:         clonazePAM (KlonoPIN) 0.5 mg tablet Comments:   Reason for Stopping:         traMADoL (ULTRAM) 50 mg tablet Comments:   Reason for Stopping:         insulin glargine (Lantus Solostar U-100 Insulin) 100 unit/mL (3 mL) inpn Comments:   Reason for Stopping:                 Follow up Care:    1. [name], NP in 1-2 weeks.  Please call to set up an appointment shortly after discharge.      Diet:  Comfort feeding    Disposition:  Home.    Advanced Directive:   FULL    DNR x     Discharge Exam:  General:   Intubated and unresponsive   Lungs:   Clear to auscultation bilaterally.   Chest wall:  No tenderness or deformity.   Heart:  Regular rate and rhythm, S1, S2 normal, no murmur, click, rub or gallop.   Abdomen:   Soft, non-tender. Bowel sounds normal. No masses,  No organomegaly.   Extremities: Extremities normal, atraumatic, no cyanosis or edema.   Pulses: 2+ and symmetric all  extremities.   Skin: Skin color, texture, turgor normal. No rashes or lesions             CONSULTATIONS: Pulmonary/Intensive care, neurology    Significant Diagnostic Studies:   11/10/2019: BUN 37 mg/dL* (Ref range: 6 - 20 mg/dL); Calcium 10.2 mg/dL* (Ref range: 8.5 - 10.1 mg/dL); CO2 22 mmol/L (Ref range: 21 - 32 mmol/L); Creatinine 2.01 mg/dL* (Ref range: 0.55 - 1.02 mg/dL); Glucose 146 mg/dL* (Ref range: 65 - 100 mg/dL); HCT 41.8 % (Ref range: 35.0 - 47.0 %); HGB 13.6 g/dL (Ref range: 11.5 - 16.0 g/dL); Potassium 4.1 mmol/L (Ref range: 3.5 - 5.1 mmol/L); Sodium 147 mmol/L* (Ref range: 136 - 145 mmol/L)  11/11/2019: BUN 25 mg/dL* (Ref range: 6 - 20 mg/dL); Calcium 9.1 mg/dL (Ref range: 8.5 - 10.1 mg/dL); CO2 22 mmol/L (Ref range: 21 - 32 mmol/L); Creatinine 1.29 mg/dL* (Ref range: 0.55 - 1.02 mg/dL); Glucose 121 mg/dL* (Ref range: 65 - 100 mg/dL); HCT 34.6 %* (Ref range: 35.0 - 47.0 %); HGB 11.0 g/dL* (Ref range: 11.5 - 16.0 g/dL); Potassium 3.6 mmol/L (Ref range: 3.5 - 5.1 mmol/L); Sodium 149 mmol/L* (Ref range: 136 - 145 mmol/L)  No results for input(s): WBC, HGB, HCT, PLT, HGBEXT, HCTEXT, PLTEXT, HGBEXT, HCTEXT, PLTEXT in the last 72 hours.  No results for input(s): NA, K, CL, CO2, BUN, CREA, GLU, CA, MG, PHOS, URICA in the last 72 hours.  No results for input(s): ALT, AP, TBIL, TBILI, TP, ALB, GLOB, GGT, AML, LPSE in the last 72 hours.    No lab exists for component: SGOT, GPT, AMYP, HLPSE  No results for input(s): INR, PTP, APTT, INREXT, INREXT in the last 72 hours.   No results for input(s): FE, TIBC, PSAT, FERR in the last 72 hours.   Recent Labs     11/20/19  0400 11/19/19  0315   PH 7.51* 7.50*   PCO2 37 36   PO2 81 78     No results for input(s): CPK, CKMB in the last 72 hours.    No lab exists for component: TROPONINI  Lab Results   Component Value Date/Time    Glucose (POC) 113 (H) 11/21/2019 11:24 AM  Glucose (POC) 177 (H) 11/21/2019 06:31 AM    Glucose (POC) 189 (H) 11/20/2019 11:57 PM    Glucose (POC)  226 (H) 11/20/2019 10:28 PM    Glucose (POC) 270 (H) 11/20/2019 05:15 PM       Discharge time spent 35 minutes    Signed:  Shelda Pal, MD  11/21/2019  2:41 PM

## 2019-11-23 ENCOUNTER — Telehealth: Payer: HMO | Admitting: Adult Health

## 2019-11-24 ENCOUNTER — Other Ambulatory Visit: Payer: HMO

## 2019-11-27 ENCOUNTER — Telehealth: Payer: Self-pay | Admitting: Physician Assistant

## 2019-11-27 NOTE — Telephone Encounter (Signed)
Tried to call the patient about rescheduling her appointment until her restaging CT scan can be done. Unable to reach her and unable to leave a voicemail.

## 2019-11-28 ENCOUNTER — Telehealth: Payer: Self-pay | Admitting: Medical Oncology

## 2019-11-28 ENCOUNTER — Inpatient Hospital Stay: Payer: Medicare HMO | Admitting: Internal Medicine

## 2019-11-28 NOTE — Telephone Encounter (Addendum)
Dtr returned my call and said that Tara Hopkins suffered a heart attack with an anoxic brain injury on Jan 1st . She is now on hospice. -please cancel all appts.  appts cancelled.

## 2019-11-28 NOTE — Telephone Encounter (Signed)
LVM on Tara Hopkins phone -tell  pt she needs to have a scan and  r/s her appt until after her scan.

## 2019-11-30 ENCOUNTER — Ambulatory Visit: Payer: Self-pay | Admitting: *Deleted

## 2019-12-11 DEATH — deceased

## 2019-12-17 ENCOUNTER — Other Ambulatory Visit: Payer: Self-pay | Admitting: Adult Health

## 2019-12-17 DIAGNOSIS — Z760 Encounter for issue of repeat prescription: Secondary | ICD-10-CM

## 2019-12-24 ENCOUNTER — Other Ambulatory Visit: Payer: Self-pay | Admitting: Adult Health

## 2019-12-24 DIAGNOSIS — Z760 Encounter for issue of repeat prescription: Secondary | ICD-10-CM

## 2019-12-26 ENCOUNTER — Other Ambulatory Visit: Payer: Self-pay | Admitting: Adult Health

## 2019-12-26 DIAGNOSIS — I10 Essential (primary) hypertension: Secondary | ICD-10-CM

## 2019-12-26 DIAGNOSIS — Z760 Encounter for issue of repeat prescription: Secondary | ICD-10-CM

## 2020-01-24 ENCOUNTER — Encounter: Payer: Self-pay | Admitting: Adult Health

## 2020-01-24 DIAGNOSIS — Z0289 Encounter for other administrative examinations: Secondary | ICD-10-CM

## 2023-09-29 NOTE — Telephone Encounter (Signed)
Telephone call
